# Patient Record
Sex: Female | Born: 1965 | Race: Black or African American | Hispanic: No | Marital: Married | Smoking: Current some day smoker
Health system: Southern US, Community
[De-identification: ages and names within clinical notes are randomized; demographics above are authoritative.]

## PROBLEM LIST (undated history)

## (undated) DIAGNOSIS — G2581 Restless legs syndrome: Secondary | ICD-10-CM

## (undated) DIAGNOSIS — M1990 Unspecified osteoarthritis, unspecified site: Secondary | ICD-10-CM

## (undated) DIAGNOSIS — I3139 Other pericardial effusion (noninflammatory): Secondary | ICD-10-CM

## (undated) DIAGNOSIS — I712 Thoracic aortic aneurysm, without rupture: Secondary | ICD-10-CM

## (undated) DIAGNOSIS — R0600 Dyspnea, unspecified: Secondary | ICD-10-CM

## (undated) DIAGNOSIS — I313 Pericardial effusion (noninflammatory): Secondary | ICD-10-CM

## (undated) DIAGNOSIS — G4730 Sleep apnea, unspecified: Secondary | ICD-10-CM

## (undated) DIAGNOSIS — I10 Essential (primary) hypertension: Secondary | ICD-10-CM

## (undated) DIAGNOSIS — M069 Rheumatoid arthritis, unspecified: Secondary | ICD-10-CM

## (undated) DIAGNOSIS — I7121 Aneurysm of the ascending aorta, without rupture: Secondary | ICD-10-CM

## (undated) DIAGNOSIS — R011 Cardiac murmur, unspecified: Secondary | ICD-10-CM

## (undated) DIAGNOSIS — I340 Nonrheumatic mitral (valve) insufficiency: Secondary | ICD-10-CM

## (undated) DIAGNOSIS — R51 Headache: Secondary | ICD-10-CM

## (undated) DIAGNOSIS — E119 Type 2 diabetes mellitus without complications: Secondary | ICD-10-CM

## (undated) DIAGNOSIS — I739 Peripheral vascular disease, unspecified: Secondary | ICD-10-CM

## (undated) DIAGNOSIS — D649 Anemia, unspecified: Secondary | ICD-10-CM

## (undated) DIAGNOSIS — N186 End stage renal disease: Secondary | ICD-10-CM

## (undated) DIAGNOSIS — I2510 Atherosclerotic heart disease of native coronary artery without angina pectoris: Secondary | ICD-10-CM

## (undated) DIAGNOSIS — E7800 Pure hypercholesterolemia, unspecified: Secondary | ICD-10-CM

## (undated) DIAGNOSIS — Z9289 Personal history of other medical treatment: Secondary | ICD-10-CM

## (undated) DIAGNOSIS — F419 Anxiety disorder, unspecified: Secondary | ICD-10-CM

## (undated) DIAGNOSIS — J189 Pneumonia, unspecified organism: Secondary | ICD-10-CM

## (undated) DIAGNOSIS — M792 Neuralgia and neuritis, unspecified: Secondary | ICD-10-CM

## (undated) DIAGNOSIS — I509 Heart failure, unspecified: Secondary | ICD-10-CM

## (undated) HISTORY — DX: Peripheral vascular disease, unspecified: I73.9

## (undated) HISTORY — DX: End stage renal disease: N18.6

## (undated) HISTORY — PX: AV FISTULA PLACEMENT: SHX1204

## (undated) HISTORY — PX: EYE SURGERY: SHX253

## (undated) HISTORY — DX: Atherosclerotic heart disease of native coronary artery without angina pectoris: I25.10

## (undated) HISTORY — PX: COLONOSCOPY: SHX174

---

## 1995-05-03 HISTORY — PX: TUBAL LIGATION: SHX77

## 2003-04-18 ENCOUNTER — Emergency Department (HOSPITAL_COMMUNITY): Admission: EM | Admit: 2003-04-18 | Discharge: 2003-04-18 | Payer: Self-pay | Admitting: Emergency Medicine

## 2003-04-21 ENCOUNTER — Inpatient Hospital Stay (HOSPITAL_COMMUNITY): Admission: EM | Admit: 2003-04-21 | Discharge: 2003-04-24 | Payer: Self-pay | Admitting: Emergency Medicine

## 2003-08-26 ENCOUNTER — Emergency Department (HOSPITAL_COMMUNITY): Admission: EM | Admit: 2003-08-26 | Discharge: 2003-08-26 | Payer: Self-pay | Admitting: Emergency Medicine

## 2004-01-01 ENCOUNTER — Emergency Department (HOSPITAL_COMMUNITY): Admission: EM | Admit: 2004-01-01 | Discharge: 2004-01-02 | Payer: Self-pay | Admitting: Emergency Medicine

## 2004-01-21 ENCOUNTER — Observation Stay (HOSPITAL_COMMUNITY): Admission: EM | Admit: 2004-01-21 | Discharge: 2004-01-23 | Payer: Self-pay | Admitting: Emergency Medicine

## 2004-02-02 HISTORY — PX: PERICARDIAL WINDOW: SHX2213

## 2004-02-07 ENCOUNTER — Ambulatory Visit: Payer: Self-pay | Admitting: Pulmonary Disease

## 2004-02-07 ENCOUNTER — Inpatient Hospital Stay (HOSPITAL_COMMUNITY): Admission: EM | Admit: 2004-02-07 | Discharge: 2004-02-12 | Payer: Self-pay | Admitting: *Deleted

## 2004-02-07 ENCOUNTER — Encounter: Payer: Self-pay | Admitting: Cardiology

## 2004-02-07 ENCOUNTER — Encounter (INDEPENDENT_AMBULATORY_CARE_PROVIDER_SITE_OTHER): Payer: Self-pay | Admitting: *Deleted

## 2004-02-07 ENCOUNTER — Ambulatory Visit: Payer: Self-pay | Admitting: Internal Medicine

## 2004-02-11 ENCOUNTER — Encounter: Payer: Self-pay | Admitting: Cardiology

## 2004-02-22 ENCOUNTER — Emergency Department (HOSPITAL_COMMUNITY): Admission: EM | Admit: 2004-02-22 | Discharge: 2004-02-22 | Payer: Self-pay | Admitting: Emergency Medicine

## 2004-02-26 ENCOUNTER — Ambulatory Visit: Payer: Self-pay | Admitting: Endocrinology

## 2004-03-02 ENCOUNTER — Encounter (HOSPITAL_COMMUNITY): Admission: RE | Admit: 2004-03-02 | Discharge: 2004-05-31 | Payer: Self-pay | Admitting: Cardiology

## 2004-03-09 ENCOUNTER — Ambulatory Visit: Payer: Self-pay | Admitting: Endocrinology

## 2004-03-13 ENCOUNTER — Ambulatory Visit: Payer: Self-pay | Admitting: Internal Medicine

## 2004-03-17 ENCOUNTER — Encounter: Admission: RE | Admit: 2004-03-17 | Discharge: 2004-06-15 | Payer: Self-pay | Admitting: Endocrinology

## 2004-03-25 ENCOUNTER — Ambulatory Visit (HOSPITAL_COMMUNITY): Admission: RE | Admit: 2004-03-25 | Discharge: 2004-03-25 | Payer: Self-pay | Admitting: Cardiology

## 2004-06-18 ENCOUNTER — Emergency Department (HOSPITAL_COMMUNITY): Admission: EM | Admit: 2004-06-18 | Discharge: 2004-06-18 | Payer: Self-pay | Admitting: Family Medicine

## 2005-09-03 ENCOUNTER — Emergency Department (HOSPITAL_COMMUNITY): Admission: EM | Admit: 2005-09-03 | Discharge: 2005-09-04 | Payer: Self-pay | Admitting: Emergency Medicine

## 2006-02-26 ENCOUNTER — Emergency Department (HOSPITAL_COMMUNITY): Admission: EM | Admit: 2006-02-26 | Discharge: 2006-02-26 | Payer: Self-pay | Admitting: Emergency Medicine

## 2007-01-10 ENCOUNTER — Emergency Department (HOSPITAL_COMMUNITY): Admission: EM | Admit: 2007-01-10 | Discharge: 2007-01-10 | Payer: Self-pay | Admitting: Emergency Medicine

## 2007-09-14 ENCOUNTER — Emergency Department (HOSPITAL_COMMUNITY): Admission: EM | Admit: 2007-09-14 | Discharge: 2007-09-14 | Payer: Self-pay | Admitting: Emergency Medicine

## 2008-02-13 ENCOUNTER — Emergency Department (HOSPITAL_COMMUNITY): Admission: EM | Admit: 2008-02-13 | Discharge: 2008-02-13 | Payer: Self-pay | Admitting: Emergency Medicine

## 2009-10-01 ENCOUNTER — Emergency Department (HOSPITAL_COMMUNITY): Admission: EM | Admit: 2009-10-01 | Discharge: 2009-10-01 | Payer: Self-pay | Admitting: Emergency Medicine

## 2010-04-16 LAB — GLUCOSE, CAPILLARY: Glucose-Capillary: 324 mg/dL — ABNORMAL HIGH (ref 70–99)

## 2010-05-18 LAB — URINALYSIS, ROUTINE W REFLEX MICROSCOPIC
Bilirubin Urine: NEGATIVE
Glucose, UA: NEGATIVE mg/dL
Ketones, ur: NEGATIVE mg/dL
Leukocytes, UA: NEGATIVE
Nitrite: NEGATIVE
Protein, ur: 100 mg/dL — AB
Specific Gravity, Urine: 1.019 (ref 1.005–1.030)
Urobilinogen, UA: 1 mg/dL (ref 0.0–1.0)
pH: 7 (ref 5.0–8.0)

## 2010-05-18 LAB — URINE MICROSCOPIC-ADD ON

## 2010-06-19 NOTE — H&P (Signed)
NAMEMarland Kitchen  ILKA, BRUCKS NO.:  [id number]   MEDICAL RECORD NO.:  [id number]          PATIENT TYPE:  INP   LOCATION:  1826                         FACILITY:  [facility]   PHYSICIAN:  Broadus John, MD DATE OF BIRTH:  1965/03/02   DATE OF ADMISSION:  01/21/2004  DATE OF DISCHARGE:                                HISTORY & PHYSICAL   IDENTIFYING DATA AND CHIEF COMPLAINT:  [name] is a 45 year old  black woman who is admitted to [facility] for further evaluation of  chest pain.   HISTORY OF THE PRESENT ILLNESS:  The patient has a several-month history of  chest pain.  She has been seen in the emergency department on multiple  occasions.  She has been previously diagnosed with musculoskeletal chest  wall pain.  Episodes have occurred at random over the last few months, lasts  several minutes to several hours at a time and are unrelated to position,  activity, meals or respirations.  They resolve spontaneously.  There is  usually no associated dyspnea, diaphoresis or nausea.   The patient experienced a severe episode of chest pain this evening.  She  was at rest at the time.  The chest pain is described as a sharp discomfort  in the substernal region.  It does not radiate.  It is associated with  dyspnea, diaphoresis and nausea at this time.  It has continued unabated  since its onset and since her visit to the emergency department.  There are  no exacerbating or ameliorating factors.  It appears to be unrelated to  position, activity, meals or respirations.   In the emergency department a second electrocardiogram demonstrated inferior  ST elevation and hence, cardiology was asked to intervene in her care.   The patient has no past history of cardiac disease including no history of  chest pain, myocardial infarction, coronary artery disease, congestive heart  failure, or arrhythmia.  She has a number of risk factors for coronary  artery disease  including smoking, diabetes mellitus, hypertension and a  family history of coronary artery disease (father).  Her lipid status is  unknown.   MEDICATIONS:  The patient takes Metformin for her diabetes and Glucophage.   ALLERGIES:  The patient is not allergic to any medications.   PAST SURGICAL HISTORY:  The patient has had previous operations of two  cesarean sections.   ALLERGIES:  None.   SOCIAL HISTORY:  The patient lives with her husband.  She is a Garment/textile technologist.   REVIEW OF SYSTEMS:  Review of systems reveals no new problems related to her  head, eyes, ears, nose, mouth, throat, lungs, gastrointestinal system,  genitourinary system, or extremities.  There is no history of neurologic or  psychiatric disorder.  There is no history of fever, chills or weight loss.   PHYSICAL EXAMINATION:  VITAL SIGNS:  Blood pressure 102/60, pulse 98 and  regular, respirations 30, and temperature 98.2.  GENERAL APPEARANCE:  The patient is an obese middle-aged black woman in  severe discomfort.  She is alert, oriented and responsive.  HEENT:  Head, eyes, nose and mouth  are normal.  NECK:  The neck is without thyromegaly or adenopathy.  Carotid pulses are  palpable bilaterally and without bruits.  HEART:  Cardiac examination reveals a normal S1 and S2.  There is no S3, S4,  murmur, rub, or click.  Cardiac rhythm is regular.  CHEST:  No chest wall tenderness is present.  LUNGS:  The lungs are clear.  ABDOMEN:  The abdomen is soft and nontender.  There is no mass,  hepatosplenomegaly, bruit, distention, rebound, guarding, or rigidity.  Bowel sounds are normal.  BREASTS, PELVIC AND RECTAL:  The breast, pelvic and rectal examinations are  not performed as they are not pertinent to the reason for acute care  hospitalization.  EXTREMITIES:  The extremities are without edema, deviation or deformity.  Radial and dorsalis pedal pulses are palpable bilaterally.  NEUROLOGIC EXAMINATION:  Brief screening  neurologic survey is unremarkable.   LABORATORY DATA:  The initial electrocardiogram performed at 19:54 revealed  mild (less than or equal to 1 mm) inferior ST segment elevation.  A second  electrocardiogram performed at 02:22 revealed 1-1.5 mm of inferior ST  segment elevation.  The inferior ST elevation on these tracings was more  pronounced than on the tracing dated August 26, 2003 in her medical records.  White count was 10.6 with a hemoglobin of 10.4 and hematocrit of 31.8.  The  initial set of cardiac markers revealed a myoglobin of 80.7, CK/MB _____ and  troponin less than 0.05.  The third set revealed a myoglobin of 66.1, CK/MB  1.8 and troponin less than 0.05.  Fibrin derivatives were 1.11.  Potassium  was 3.6, BUN 11 and creatinine 0.5.  The remaining studies were pending at  the time of this dictation.   A chest CT according to the radiologist revealed no evidence of pulmonary  embolus or aortic dissection.   IMPRESSION:  Chest pain, rule out myocardial infarction.  Pulmonary embolus and aortic dissection are excluded by chest computerized  tomography.  Pericarditis is a possibility, but the ST segment elevation is  limited to the inferior lead and is not global.  The pain is somewhat  atypical for a myocardial infarction as it is sharp and pleuritic.  The  inferior ST elevation is mild, but more prominent than on the tracing  earlier this evening and essentially new since the August 26, 2003 tracing.   The patient has multiple risk factors for coronary artery disease including  smoking, diabetes mellitus, hypertension and a family history of coronary  artery disease.   Due to these circumstances emergent cardiac catheterization is warranted to  exclude an acute myocardial infarction.   PLAN:  1.  Emergent cardiac catheterization.  2.  Intravenous nitroglycerin and intravenous heparin until then.  3.  Aspirin.  Cardiac catheterization, PTCA and stenting have been discussed  with the  patient including the benefits, risks and alternatives. She wishes to  proceed.   This patient encounter was chaperoned by nurse J. Chrissie Noa.      Mitc   MSC/MEDQ  D:  01/22/2004  T:  01/22/2004  Job:  [id number]   cc:   Peter M. Martinique, M.D.  [address] [place], [place] [address]  Fax: [phone]

## 2010-06-19 NOTE — H&P (Signed)
[name], [name]                        ACCOUNT NO.:  [id number]   MEDICAL RECORD NO.:  [id number]                   PATIENT TYPE:  INP   LOCATION:  0354                                 FACILITY:  [facility]   PHYSICIAN:  Wenda Low, M.D.                 DATE OF BIRTH:  [birth year]   DATE OF ADMISSION:  04/20/2003  DATE OF DISCHARGE:                                HISTORY & PHYSICAL   CHIEF COMPLAINT:  Fevers and some shortness of breath.   PRIMARY CARE PHYSICIAN:  Dr. Valere Dross at [facility].   HISTORY OF PRESENT ILLNESS:  [age]-year-old African-American female  with history of obesity and diabetes seen in the emergency room on April 18, 2003 with 2- to 3-day history of respiratory symptoms and a little bit of  head congestion and fever of 103, had influenza titers done 2 hours which  was negative for A and B, she was given some fluids and her temperature came  down, she was sent home.  Patient stated that she had low-grade temperature  at home and today fever went up to 103 and started having a little bit of a  cough today and some yellow sputum, some shortness of breath especially when  she breathes her right side of the chest hurts and had little bit of  headaches, sore throat and sinus congestion, no nausea, vomiting, or  diarrhea.  Her blood sugar was elevated today to 200.  In the emergency room  she was found to be febrile at 104 with a chest x-ray showing an infiltrate  which was new from April 18, 2003.   PAST MEDICAL HISTORY:  1. Diabetes since 1997.  2. Hypertension.   MEDICATIONS:  1. Glucophage 500 b.i.d.  2. Glyburide 2.5 mg once daily.  3. Actos 45 mg once daily.  4. Lotensin 20 mg once daily.   ALLERGIES:  None.   PAST SURGICAL HISTORY:  Cesarean sections x2.   FAMILY HISTORY:  Father deceased with diabetic complications.  Mother 68  with diabetes.  One brother diabetic.  One sister has __________tension.   TOBACCO:  One pack a  day.   ALCOHOL:  None.   DRUGS:  No recreational drugs.   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  Married with two children.   REVIEW OF SYSTEMS:  As above.   PHYSICAL EXAMINATION:  VITAL SIGNS:  Initially temperature was 104.1, blood  pressure 144/69, heart rate 120-130 sinus tachycardia, respirations 22, 99%  saturations on room air.  GENERAL:  No acute distress lying in bed comfortable, denied any shortness  of breath, complained of some right-sided chest soreness with breathing.  Mucous membranes mildly dry.  LUNGS:  Clear, occasional rhonchi on the right side.  CARDIAC:  S1, S2, tachycardic without murmurs.  NECK:  Supple.  ABDOMEN:  Soft, obese, positive bowel sounds, nontender.  EXTREMITIES:  No edema.  LABORATORY:  White count of 27.8, hemoglobin 9.9.  Chemistry:  Sodium 135,  potassium 3.1, BUN of 11, creatinine 0.8, glucose 192.  LFTs were normal.  UA was negative.  Pregnancy test negative.  Blood cultures were sent.  Sputum culture sent.  Chest x-ray:  Right lung infiltrate most likely at the  right middle lobe versus right upper lobe lower segment.   IMPRESSION:  [age]-year-old female with diabetes presented with  fevers, elevated white count, influenza titers negative 2 days ago with  right lung infiltrate.   Problem 1. RIGHT LUNG PNEUMONIA.  Admit for intravenous antibiotics  (Rocephin and gentamicin), oxygen, intravenous fluids.   Problem 2. HYPERTENSION.  Continue on Lotensin.   Problem 3. DIABETES.  Continue on Glucophage and Actos as well as glyburide,  sliding scale insulin.   Problem 4. HYPOKALEMIA.  Mild.   Problem 5. TOBACCO ABUSE.  Continue with nicotine patch.  She smokes one  pack a day.   Problem 6. ANEMIA.  We will check some iron studies.                                               Wenda Low, M.D.    Veatrice Bourbon  D:  04/21/2003  T:  04/22/2003  Job:  [id number]   cc:   Biagio Borg, M.D.  [address], [place]  [place]  [place] [address]  Fax: [phone]

## 2010-06-19 NOTE — Discharge Summary (Signed)
[name], [name]                        ACCOUNT NO.:  [id number]   MEDICAL RECORD NO.:  [id number]                   PATIENT TYPE:  INP   LOCATION:  0354                                 FACILITY:  [facility]   PHYSICIAN:  Melissa L. Lovena Le, MD               DATE OF BIRTH:  [birth year]   DATE OF ADMISSION:  04/20/2003  DATE OF DISCHARGE:  04/24/2003                                 DISCHARGE SUMMARY   ADMISSION DIAGNOSES:  1. Right lower lobe pneumonia.  2. Hypertension.  3. Diabetes.  4. Hypokalemia.  5. Tobacco abuse.  6. Anemia.   DISCHARGE DIAGNOSES:  1. Resolving right lower lobe lung pneumonia, status post Rocephin and     azithromycin x3 days intravenously and changed to p.o. moxifloxacin.  2. Hypertension, stable.  3. Diabetes, mildly uncontrolled on Glucophage, Actos, Glyburide, and     sliding scale insulin.  4. Hypokalemia, repleted.  5. Tobacco abuse remains an issue at discharge.  6. Anemia, stable.  Patient receiving iron therapy.  7. Herpes labialis, status post treatment with Valtrex.   DISCHARGE MEDICATIONS:  1. Metformin 1000 mg in the a.m. and 500 mg in the p.m.  This has been     changed from 500 mg b.i.d. at admission.  2. Actos 45 mg daily.  3. Lotensin 20 mg once daily.  4. Humibid LA 600 mg b.i.d.  5. Glyburide 2.5 mg once daily.  6. Ferrous sulfate 325 mg t.i.d.  7. Moxifloxacin 400 mg once daily x7 days.   ACTIVITY:  Return to work Monday one week following this discharge.   DIET:  Carbohydrate modified.   FOLLOWUP:  She is to see her primary care physician, Dr. Valere Dross, in one  week, and make an appointment to see her OB/GYN doctor regarding her  increased menstrual bleeding.   HISTORY OF PRESENT ILLNESS:  The patient is a 45 year old African-American  female with a history of obesity and diabetes who was evaluated on April 18, 2003, with two to three day's history of respiratory symptoms.  She had a  fever at the time.  Influenza titers  were drawn which were negative.  She  was given fluids and was discharged to home for symptomatic care.  The  patient then experienced further fever activity up to 103, with a cough  productive of yellow sputum, as well as increasing shortness of breath and  right-sided chest discomfort.  The chest x-ray revealed a right-sided  infiltrate which was new since the emergency room visit on April 18, 2003.   HOSPITAL COURSE:  The patient was admitted to the telemetry floor.  She was  started on Rocephin and azithromycin, and aggressive pulmonary toilet, was  rehydrated.  White count was found to be 26.4 on admission with a potassium  of 3.1.  The patient remained with a cough on day #2, but appeared improved  status post IV rehydration.  She offered no  significant complaint of chest  pain and her overall clinical picture appeared improved after starting on  antibiotics.  By April 24, 2003, the patient states that her sputum is  clear, she has less cough, she is not short of breath with ambulation, her  white count was found to be 8.8, which was down significantly, and her  hemoglobin and hematocrit were stable at 9.7 and 29.5.  Her potassium was 4.  She was converted to oral moxifloxacin for discharge, and an order was  placed for her diabetes monitoring equipment, as well as other needed strips  and Lantus.  Her anemia was determined to be stable, and she was instructed  to see her OB/GYN to evaluate her regularly heavy periods.  She was  instructed to continue her oral iron.  She had been treated with a course of  Valtrex for herpes labialis during the course of the hospitalization.   PHYSICAL EXAMINATION:  VITAL SIGNS:  On the day of discharge, her vital  signs were 98.7 temperature with a blood pressure of 129/76, pulse of 77,  respiratory rate of 18.  Her blood glucoses ranged from 139 to 217.  GENERAL:  She was in no acute distress.  HEENT:  Pupils equal, round, reactive to light.   Extraocular movements were  intact.  Mucous membranes are moist.  CHEST:  Clear to auscultation.  CARDIOVASCULAR:  Regular rate and rhythm, positive S1 and S2.  No S3 or S4.  ABDOMEN:  Soft, nontender, nondistended, positive bowel sounds.  EXTREMITIES:  There was no edema.  NEUROLOGIC:  She was nonfocal and appeared stable for discharge.   CONDITION ON DISCHARGE:  Stable with followup to her primary care physician,  Dr. Jacklynn Ganong.                                               Melissa L. Lovena Le, MD    MLT/MEDQ  D:  05/23/2003  T:  05/24/2003  Job:  [id number]   cc:   Biagio Borg, M.D.  [address], [place]  [place]  [place] [address]  Fax: [phone]

## 2010-06-19 NOTE — H&P (Signed)
[name], [name] NO.:  [id number]   MEDICAL RECORD NO.:  [id number]          PATIENT TYPE:  INP   LOCATION:  [id number]                         FACILITY:  [facility]   PHYSICIAN:  Broadus John, MD DATE OF BIRTH:  1965/07/07   DATE OF ADMISSION:  02/06/2004  DATE OF DISCHARGE:                                HISTORY & PHYSICAL   HISTORY OF PRESENT ILLNESS:  [name] is a 45 year old black woman  who is admitted to [facility] because of a large pericardial  effusion.   The patient was first seen on January 22, 2004.  At that time, she  presented with chest pain.  Based on her electrocardiogram, then it was  difficult to determine if her chest pain was due to acute myocardial  infarction or a pericarditis.  In order to exclude an acute myocardial  infarction, she was taken emergently to the cardiac catheterization  laboratory where no coronary artery disease was found.  It was concluded  that her chest pain was due to pericarditis.  She was discharged on Motrin.  An echocardiogram was performed during that hospitalization, but the results  are not yet on the chart.   The patient returned to the emergency department tonight because of chest  pain and dyspnea.  Both of these symptoms have accelerated in the last day.  The chest pain is an ache in her left anterior chest.  She has not  experienced any dizziness, lightheadedness, syncope, or near syncope.  Her  chest pain appeared to improve in the sitting position.  A chest x-ray this  evening demonstrated new cardiomegaly.  Fibrin derivatives were elevated.  A  chest CT has demonstrated a large pericardial effusion, bilateral pleural  effusions, and a single tiny pulmonary embolus in the central right  pulmonary artery.  She is admitted for further evaluation and management.   MEDICATIONS:  Glucophage.   ALLERGIES:  The patient is not allergic to any medications.   PAST SURGICAL HISTORY:  The  patient has had 2 prior cesarean sections.   SOCIAL HISTORY:  The patient lives with her husband.  She is a Psychologist, clinical.   REVIEW OF SYSTEMS:  Reveals no new problems related to her head, ears, nose,  mouth, throat, lungs, gastrointestinal system, genitourinary system, or  extremities.  There is no history of neurologic or psychiatric reported.  There is no history of fever, chills, or weight loss.   PHYSICAL EXAMINATION:  VITAL SIGNS:  Blood pressure 118/62, pulse 124 and  regular, respirations 24, temperature 99.1.  Pulse oximetry 95% on room air.  GENERAL:  The patient was an obese, middle-aged blood woman in moderate  discomfort.  She was alert, oriented, and responsive.  HEENT:  Head, eyes, nose, and mouth were normal.  NECK:  The neck was without thyromegaly or adenopathy.  There was no jugular  venous distention.  Carotid pulses were palpable bilaterally and without  bruits.  CARDIAC:  A normal S1 and S2.  There was no S3, S4, murmur, rub, or click.  Cardiac rhythm was regular.  CHEST WALL:  No  chest wall tenderness was noted.  LUNGS:  Clear.  ABDOMEN:  Soft and nontender.  There was no mass, hepatosplenomegaly, bruit,  distention, rebound, guarding, or rigidity.  Bowel sounds were normal.  BREASTS/PELVIC/RECTAL:  Not performed, as they were not pertinent to the  reason for acute care hospitalization.  EXTREMITIES:  Without edema, deviation, or deformity.  There was no cord,  tenderness, or swelling.  Radial and dorsalis pedis pulses were palpable  bilaterally.  Brief screening neurologic survey was unremarkable.   The chest radiograph, as reported above, demonstrated cardiomegaly.  A chest  CT revealed a large pericardial effusion, bilateral pleural effusions, and a  single tiny pulmonary embolus in the central pulmonary artery.  The  electrocardiogram reveals sinus tachycardia and nonspecific T wave  abnormality.  Voltages were diminished from her studies in December.   Arterial blood gas on 2 liters of oxygen via nasal cannula revealed a pH of  7.47, PO2 of 69, PCO2 of 32, and oxygen saturation 95.  White count was 14.6  with a hemoglobin of 8.4 and hematocrit of 25.6.  Fibrin derivatives were  5.60.  Potassium was 3.8, BUN 13, and creatinine 0.7.  The remaining studies  were pending at the time of this dictation.   IMPRESSION:  1.  Large pericardial effusion; rule out tamponade.  2.  Bilateral pleural effusions.  3.  Single tiny pulmonary embolus in central right pulmonary artery.  4.  Anemia; worse in last 2 weeks.  5.  Noninsulin-dependent diabetes mellitus.   PLAN:  1.  Coronary care unit.  2.  Emergency echocardiogram.  3.  No anticoagulation until it can be confirmed that the effusion is not      hemorrhagic.  4.  Evaluate for etiology of the pericardial and pleural effusions.  5.  Work up anemia.  6.  Further measures per Dr. Martinique.      Mitc   MSC/MEDQ  D:  02/07/2004  T:  02/07/2004  Job:  [id number]   cc:   Peter M. Martinique, M.D.  [id number] [place], [place] [address]  Fax: [phone]

## 2010-06-19 NOTE — Cardiovascular Report (Signed)
[name], [name] NO.:  [id number]   MEDICAL RECORD NO.:  [id number]          PATIENT TYPE:  INP   LOCATION:  2929                         FACILITY:  [facility]   PHYSICIAN:  Peter M. Martinique, M.D.  DATE OF BIRTH:  10/28/1965   DATE OF PROCEDURE:  01/22/2004  DATE OF DISCHARGE:                              CARDIAC CATHETERIZATION   INDICATION FOR PROCEDURE:  This 45 year old black female with multiple  cardiac risk factors including of tobacco abuse, diabetes mellitus,  hyperlipidemia, and hypertension presents with refractory chest pain.  She  does have mild ST elevation in the inferior leads.  Emergent chest CT was  negative for pulmonary embolus or aortic dissection.  There was no obvious  pericardial effusion.  Patient having ongoing severe chest pain.   PROCEDURES:  1.  Left heart catheterization.  2.  Coronary and left ventricular angiography.   ACCESS:  Via the right femoral artery using standard Seldinger technique.   EQUIPMENT:  6-French 4 cm right and left Judkins catheter, 6-French pigtail  catheter, 6-French arterial sheath.   CONTRAST:  90 mL of Omnipaque.   MEDICATIONS:  Local anesthesia 1% Xylocaine, fentanyl 25 mcg IV.   HEMODYNAMIC DATA:  1.  Aortic pressure was 120/79 with a mean of 98 mmHg.  2.  Left ventricular pressure was 120 with EDP of 25 mmHg.   ANGIOGRAPHIC DATA:  1.  The left coronary artery arises and distributes normally.  It is a very      large vessel.  The left main coronary artery is normal.  2.  Left anterior descending artery is a large vessel and is normal.  Gives      rise to a large diagonal branch which is also normal.  3.  The left circumflex coronary artery gives rise to two large marginal      vessels and this entire vessel is normal.  4.  The right coronary artery arises and distributes normally.  It is a      dominant vessel.  It is normal throughout its course.   LEFT VENTRICULOGRAPHY:  Left ventricular  angiography was performed in the  RAO view.  This demonstrates normal left ventricular size with hyperdynamic  contractility.  Ejection fraction estimated at 75%.  There are no segmental  wall motion abnormalities.  There is no mitral insufficiency.  The aortic  valve appears normal.   FINAL INTERPRETATION:  1.  Normal coronary anatomy.  2.  Normal left ventricular function.  Based on these findings we have      suggested the patient's pain syndrome is secondary to acute      pericarditis.       PMJ/MEDQ  D:  01/22/2004  T:  01/22/2004  Job:  [id number]   cc:   Biagio Borg, M.D.  [address] [place]  [place]  [place] [address]  Fax: [phone]

## 2010-06-19 NOTE — Op Note (Signed)
NAMEJAHNEA, [name] NO.:  [id number]   MEDICAL RECORD NO.:  [id number]          PATIENT TYPE:  INP   LOCATION:  2931                         FACILITY:  [facility]   PHYSICIAN:  Lanelle Bal, MD    DATE OF BIRTH:  1965-12-18   DATE OF PROCEDURE:  02/07/2004  DATE OF DISCHARGE:                                 OPERATIVE REPORT   PREOPERATIVE DIAGNOSIS:  Large pericardial effusion with tamponade  physiology.   POSTOPERATIVE DIAGNOSIS:  Large pericardial effusion with tamponade  physiology.   PROCEDURE:  Subxiphoid pericardial window and drainage of pericardial  effusion.   SURGEON:  Lanelle Bal, M.D.   BRIEF HISTORY:  The patient is a 45 year old female who had been  hospitalized several weeks prior with nonspecific chest pain.  At that time,  she had undergone cardiac catheterization which showed no evidence of  coronary disease.  She was treated with anti-inflammatories with presumed  pericarditis.  She now returns with several-day history of increasing  shortness of breath to the point where she could not lie down flat without  becoming severely dyspneic.  Echocardiogram and CT scans were done which  demonstrated large pericardial effusion, nonspecific mediastinal lymph node  enlargement, question of a small pulmonary embolus in the right pulmonary  artery but this was questionable call and a large pericardial effusion.  Because of the patient's symptoms, urgent pericardial drainage was  recommended to the patient who agreed and signed informed consent.   DESCRIPTION OF PROCEDURE:  The patient underwent general endotracheal  anesthesia.  She remained hemodynamically stable in spite of the tamponade  physiology.  A transesophageal echocardiogram probe was placed confirming a  large pericardial circumferential pericardial effusion.  The skin of the  chest was prepped with Betadine and draped in the usual sterile manner.  An  incision was made over this  xiphoid process and carried down to the xiphoid  process, which was partially removed with the bone rongeur.  The ribs were  then elevated with the Rultract and the mediastinal fat was dissected off  the pericardial sac and the pericardial sac was identified and opened.  Approximately 560-600 mL of bloody pericardial effusion was removed.  A  large quantity of this fluid was sent for cytology.  Additional fluid was  sent for chemistries and culture, including viral cultures.  The pericardium  itself was just only slightly thickened.  There was some fibrinous debris on  the epicardium. The angle 28-chest tube was left in the pericardial space  and brought out through a separate stab wound on the chest.  Fascia was then  closed with interrupted 0 Vicryl, running 3-0 Vicryl on subcutaneous tissue  and 4-0 subcuticular stitch on the skin edges. The patient tolerated the  procedure.   The patient had the procedure, transesophageal echocardiogram, which showed  near-complete resolution of the pericardial effusion and with return to  normal dynamic movements, especially of the right ventricle and atrium.  The  patient's attempts at weaning and extubation of the patient succeeded in an  extubation.  However, after a period of observation, the patient became  tired  and required re-intubation.  She was then transferred to the coronary  care unit on the ventricular.   Estimated blood loss was minimal.  Because of low hematocrit at the  beginning of the procedure, the patient was transfused a unit of packed red  blood cells.      Edwa   EG/MEDQ  D:  02/07/2004  T:  02/07/2004  Job:  [id number]   cc:   Glory Buff, M.D. [facility]   Peter M. Martinique, M.D.  [address] [place], [place] [address]  Fax: [phone]

## 2010-06-19 NOTE — Consult Note (Signed)
[name], [name]            ACCOUNT NO.:  [id number]   MEDICAL RECORD NO.:  [id number]          PATIENT TYPE:  INP   LOCATION:  2931                         FACILITY:  [facility]   PHYSICIAN:  Revonda Standard. Roxan Hockey, M.D.DATE OF BIRTH:  Aug 09, 1965   DATE OF CONSULTATION:  02/07/2004  DATE OF DISCHARGE:                                   CONSULTATION   REASON FOR CONSULTATION:  Pericardial effusion.   HISTORY OF PRESENT ILLNESS:  [name] is a 45 year old African American  female who presents with a chief complaint of shortness of breath, cough and  low grade fevers.  She also has chest tightness with deep breaths.  She had  been diagnosed with acute pericarditis on January 22, 2004.  At that time  she had a negative CT of the chest and a normal echocardiogram.  She had an  elevated sed rate and elevated RA factors.  She was treated with anti-  inflammatories and had initial response, but over the past week, has  developed progressive dyspnea, also chest tightness with deep breathing.  She has also noted cough, low grade fever and increase in abdominal girth.  She was admitted and had a CT of the chest which showed a large pericardial  effusion, small bilateral pleural effusions.  There also was small embolus  in the right pulmonary artery.  Echocardiogram  was performed which showed  hyperdynamic left ventricle.  There was right atrial collapse as well as  right ventricular compression.  There also was evidence for tamponade with  pulsus paradoxus.   Currently, the patient is short of breath but hemodynamically stable with  blood pressure of 138/70.  She is tachycardic at 129.   PAST MEDICAL HISTORY:  1.  Acute pericarditis.  2.  Anemia.  3.  Diabetes.  4.  Small pulmonary nodule.  5.  Thrombocytosis  6.  Hyponatremia.   CURRENT MEDICATIONS:  1.  Insulin drip.  2.  Started on Solu-Medrol.  3.  She was on Glucophage prior to admission.   ALLERGIES:  No known drug  allergies.   SOCIAL HISTORY:  Occasional ethanol.  She smokes one half pack a day.  She  does not use drugs.   FAMILY HISTORY:  Noncontributory.   REVIEW OF SYMPTOMS:  Negative other than that as mentioned in the HPI.   PHYSICAL EXAMINATION:  GENERAL APPEARANCE:  [name] is a 45 year old  African American female.  She is obese.  She is in mild respiratory  distress.  She does have a pulsus paradoxus at baseline.  VITAL SIGNS:  Blood pressure is 130/70.  NECK:  JVD is hard to establish.  She is obese.  LUNGS:  Her lungs have diminished breath sounds at the bases.  CARDIOVASCULAR:  Her heart sounds are muffled.  There is no murmur.  EXTREMITIES:  She does have 1+ pitting edema bilaterally.   LABORATORY DATA:  Her white count is 14.6, hematocrit 25.6, platelets 592.  Sed rate is 83.  PT 13.7, PTT 38.  BUN and creatinine 13 and 0.7, sodium  133, potassium 8.8.  CPK was 134, MB 1.2, troponin  0.01.   IMPRESSION:  [name] is a 45 year old African American female with  pericarditis.  She now has a large pericardial effusion.  This has been  rapidly progressive and is symptomatic.  She does have echocardiographic  signs of tamponade, although clinically she is not in tamponade at this  point at time.  She does need urgent subxiphoid pericardial window for  drainage of fluid.  The fluid will be sent for appropriate studies as well.  The issue was complicated somewhat by the pulmonary embolus.  I think this  would push for surgical definitive drainage rather than pericardial centesis  so that she could be started on full dose anticoagulate as soon as possible.  This patient understands that there will be some delay in initiation of  anticoagulation due to the issues surrounding surgery.  She understands the  nature and extent of the procedure and she understands the risks including,  but are not limited to, death, bleeding, infection, pulmonary embolus, and  cardiac injury.  She  understands and accepts these risks and agrees to  proceed with surgery.  Will plan to proceed with surgery as soon as  possible.  Dr. Servando Snare is likely to be the first surgeon available.  Once  his room is open, he would be the one to do the procedure.  She understands  this as well.       SCH/MEDQ  D:  02/07/2004  T:  02/07/2004  Job:  [id number]

## 2010-07-15 ENCOUNTER — Inpatient Hospital Stay (INDEPENDENT_AMBULATORY_CARE_PROVIDER_SITE_OTHER)
Admission: RE | Admit: 2010-07-15 | Discharge: 2010-07-15 | Disposition: A | Discharge status: Home / Self Care | Payer: Self-pay | Source: Ambulatory Visit | Attending: Family Medicine | Admitting: Family Medicine

## 2010-07-15 DIAGNOSIS — I10 Essential (primary) hypertension: Secondary | ICD-10-CM

## 2010-07-15 LAB — POCT I-STAT, CHEM 8
BUN: 9 mg/dL (ref 6–23)
Chloride: 101 mEq/L (ref 96–112)
Creatinine, Ser: 0.9 mg/dL (ref 0.4–1.2)
Sodium: 133 mEq/L — ABNORMAL LOW (ref 135–145)
TCO2: 26 mmol/L (ref 0–100)

## 2010-08-02 ENCOUNTER — Emergency Department (HOSPITAL_COMMUNITY): Payer: Self-pay

## 2010-08-02 ENCOUNTER — Emergency Department (HOSPITAL_COMMUNITY)
Admission: EM | Admit: 2010-08-02 | Discharge: 2010-08-02 | Disposition: A | Discharge status: Home / Self Care | Payer: Self-pay | Attending: Emergency Medicine | Admitting: Emergency Medicine

## 2010-08-02 DIAGNOSIS — E119 Type 2 diabetes mellitus without complications: Secondary | ICD-10-CM | POA: Insufficient documentation

## 2010-08-02 DIAGNOSIS — I10 Essential (primary) hypertension: Secondary | ICD-10-CM | POA: Insufficient documentation

## 2010-08-02 DIAGNOSIS — R1011 Right upper quadrant pain: Secondary | ICD-10-CM | POA: Insufficient documentation

## 2010-08-02 DIAGNOSIS — Q619 Cystic kidney disease, unspecified: Secondary | ICD-10-CM | POA: Insufficient documentation

## 2010-08-02 DIAGNOSIS — Z79899 Other long term (current) drug therapy: Secondary | ICD-10-CM | POA: Insufficient documentation

## 2010-08-02 DIAGNOSIS — L2989 Other pruritus: Secondary | ICD-10-CM | POA: Insufficient documentation

## 2010-08-02 DIAGNOSIS — L298 Other pruritus: Secondary | ICD-10-CM | POA: Insufficient documentation

## 2010-08-02 LAB — URINALYSIS, ROUTINE W REFLEX MICROSCOPIC
Bilirubin Urine: NEGATIVE
Glucose, UA: 500 mg/dL — AB
Protein, ur: >300 mg/dL — AB

## 2010-08-02 LAB — CBC
HCT: 32.7 % — ABNORMAL LOW (ref 36.0–46.0)
Hemoglobin: 11.2 g/dL — ABNORMAL LOW (ref 12.0–15.0)
MCH: 28.9 pg (ref 26.0–34.0)
MCHC: 34.3 g/dL (ref 30.0–36.0)
MCV: 84.5 fL (ref 78.0–100.0)

## 2010-08-02 LAB — COMPREHENSIVE METABOLIC PANEL
ALT: 20 U/L (ref 0–35)
AST: 21 U/L (ref 0–37)
Alkaline Phosphatase: 58 U/L (ref 39–117)
CO2: 26 mEq/L (ref 19–32)
Calcium: 9.3 mg/dL (ref 8.4–10.5)
Chloride: 100 mEq/L (ref 96–112)
GFR calc Af Amer: >60 mL/min (ref >60)
GFR calc non Af Amer: >60 mL/min (ref >60)
Glucose, Bld: 246 mg/dL — ABNORMAL HIGH (ref 70–99)
Potassium: 3.3 mEq/L — ABNORMAL LOW (ref 3.5–5.1)
Sodium: 137 mEq/L (ref 135–145)

## 2010-08-02 LAB — DIFFERENTIAL
Basophils Relative: 1 % (ref 0–1)
Lymphs Abs: 2.6 10*3/uL (ref 0.7–4.0)
Monocytes Absolute: 0.6 10*3/uL (ref 0.1–1.0)
Monocytes Relative: 7 % (ref 3–12)
Neutro Abs: 5.4 10*3/uL (ref 1.7–7.7)

## 2010-08-02 LAB — URINE MICROSCOPIC-ADD ON

## 2010-08-18 ENCOUNTER — Emergency Department (HOSPITAL_COMMUNITY)
Admission: EM | Admit: 2010-08-18 | Discharge: 2010-08-18 | Disposition: A | Discharge status: Home / Self Care | Payer: Self-pay | Attending: Emergency Medicine | Admitting: Emergency Medicine

## 2010-08-18 ENCOUNTER — Emergency Department (HOSPITAL_COMMUNITY): Payer: Self-pay

## 2010-08-18 DIAGNOSIS — Z79899 Other long term (current) drug therapy: Secondary | ICD-10-CM | POA: Insufficient documentation

## 2010-08-18 DIAGNOSIS — R109 Unspecified abdominal pain: Secondary | ICD-10-CM | POA: Insufficient documentation

## 2010-08-18 DIAGNOSIS — E119 Type 2 diabetes mellitus without complications: Secondary | ICD-10-CM | POA: Insufficient documentation

## 2010-08-18 DIAGNOSIS — R319 Hematuria, unspecified: Secondary | ICD-10-CM | POA: Insufficient documentation

## 2010-08-18 DIAGNOSIS — I10 Essential (primary) hypertension: Secondary | ICD-10-CM | POA: Insufficient documentation

## 2010-08-18 LAB — URINALYSIS, ROUTINE W REFLEX MICROSCOPIC
Leukocytes, UA: NEGATIVE
Nitrite: NEGATIVE
Protein, ur: >300 mg/dL — AB
Specific Gravity, Urine: 1.025 (ref 1.005–1.030)
Urobilinogen, UA: 0.2 mg/dL (ref 0.0–1.0)

## 2010-08-18 LAB — POCT I-STAT, CHEM 8
Hemoglobin: 11.6 g/dL — ABNORMAL LOW (ref 12.0–15.0)
Potassium: 3 mEq/L — ABNORMAL LOW (ref 3.5–5.1)
Sodium: 135 mEq/L (ref 135–145)
TCO2: 21 mmol/L (ref 0–100)

## 2010-08-18 LAB — URINE MICROSCOPIC-ADD ON

## 2010-08-18 MED ORDER — IOHEXOL 300 MG/ML  SOLN
80.0000 mL | Freq: Once | INTRAMUSCULAR | Status: AC | PRN
  Administered 2010-08-18: 80 mL via INTRAVENOUS

## 2010-10-30 LAB — CBC
HCT: 33.7 — ABNORMAL LOW
Hemoglobin: 11.2 — ABNORMAL LOW
MCHC: 33.3
MCV: 81
RBC: 4.16

## 2010-10-30 LAB — POCT I-STAT, CHEM 8
Calcium, Ion: 1.11 — ABNORMAL LOW
Chloride: 104
Glucose, Bld: 290 — ABNORMAL HIGH
HCT: 34 — ABNORMAL LOW
TCO2: 24

## 2010-10-30 LAB — DIFFERENTIAL
Basophils Relative: 1
Eosinophils Absolute: 0.3
Eosinophils Relative: 3
Monocytes Absolute: 0.4
Monocytes Relative: 5
Neutro Abs: 4.9

## 2010-10-30 LAB — B-NATRIURETIC PEPTIDE (CONVERTED LAB): Pro B Natriuretic peptide (BNP): 45

## 2010-11-09 LAB — DIFFERENTIAL
Basophils Absolute: 0.2 — ABNORMAL HIGH
Eosinophils Absolute: 0.3
Eosinophils Relative: 3

## 2010-11-09 LAB — CBC
MCV: 78.7
RBC: 4.01
WBC: 11.1 — ABNORMAL HIGH

## 2010-11-09 LAB — COMPREHENSIVE METABOLIC PANEL
ALT: 15
AST: 16
CO2: 28
Chloride: 100
Creatinine, Ser: 0.69
GFR calc Af Amer: >60
GFR calc non Af Amer: >60
Sodium: 135
Total Bilirubin: 0.5

## 2010-11-09 LAB — D-DIMER, QUANTITATIVE: D-Dimer, Quant: 0.25

## 2010-12-11 ENCOUNTER — Encounter: Payer: Self-pay | Admitting: *Deleted

## 2010-12-11 ENCOUNTER — Emergency Department (HOSPITAL_COMMUNITY)
Admission: EM | Admit: 2010-12-11 | Discharge: 2010-12-11 | Disposition: A | Discharge status: Home / Self Care | Payer: Self-pay | Attending: Emergency Medicine | Admitting: Emergency Medicine

## 2010-12-11 DIAGNOSIS — R631 Polydipsia: Secondary | ICD-10-CM | POA: Insufficient documentation

## 2010-12-11 DIAGNOSIS — E119 Type 2 diabetes mellitus without complications: Secondary | ICD-10-CM | POA: Insufficient documentation

## 2010-12-11 DIAGNOSIS — Z7982 Long term (current) use of aspirin: Secondary | ICD-10-CM | POA: Insufficient documentation

## 2010-12-11 DIAGNOSIS — F17200 Nicotine dependence, unspecified, uncomplicated: Secondary | ICD-10-CM | POA: Insufficient documentation

## 2010-12-11 DIAGNOSIS — R609 Edema, unspecified: Secondary | ICD-10-CM | POA: Insufficient documentation

## 2010-12-11 DIAGNOSIS — R209 Unspecified disturbances of skin sensation: Secondary | ICD-10-CM | POA: Insufficient documentation

## 2010-12-11 DIAGNOSIS — R358 Other polyuria: Secondary | ICD-10-CM | POA: Insufficient documentation

## 2010-12-11 DIAGNOSIS — R079 Chest pain, unspecified: Secondary | ICD-10-CM | POA: Insufficient documentation

## 2010-12-11 DIAGNOSIS — Z9889 Other specified postprocedural states: Secondary | ICD-10-CM | POA: Insufficient documentation

## 2010-12-11 DIAGNOSIS — R3589 Other polyuria: Secondary | ICD-10-CM | POA: Insufficient documentation

## 2010-12-11 DIAGNOSIS — R739 Hyperglycemia, unspecified: Secondary | ICD-10-CM

## 2010-12-11 DIAGNOSIS — I10 Essential (primary) hypertension: Secondary | ICD-10-CM | POA: Insufficient documentation

## 2010-12-11 DIAGNOSIS — M7989 Other specified soft tissue disorders: Secondary | ICD-10-CM | POA: Insufficient documentation

## 2010-12-11 HISTORY — DX: Essential (primary) hypertension: I10

## 2010-12-11 LAB — CBC
HCT: 34.1 % — ABNORMAL LOW (ref 36.0–46.0)
MCV: 85.7 fL (ref 78.0–100.0)
Platelets: 438 10*3/uL — ABNORMAL HIGH (ref 150–400)
RBC: 3.98 MIL/uL (ref 3.87–5.11)
RDW: 14.2 % (ref 11.5–15.5)
WBC: 9.4 10*3/uL (ref 4.0–10.5)

## 2010-12-11 LAB — BASIC METABOLIC PANEL
CO2: 24 mEq/L (ref 19–32)
Chloride: 100 mEq/L (ref 96–112)
Creatinine, Ser: 1.13 mg/dL — ABNORMAL HIGH (ref 0.50–1.10)
GFR calc Af Amer: 66 mL/min — ABNORMAL LOW (ref >90)
Sodium: 135 mEq/L (ref 135–145)

## 2010-12-11 LAB — POCT I-STAT TROPONIN I

## 2010-12-11 LAB — TROPONIN I: Troponin I: <0.3 ng/mL (ref <0.30)

## 2010-12-11 MED ORDER — METFORMIN HCL 1000 MG PO TABS: 1000.0000 mg | ORAL_TABLET | Freq: Two times a day (BID) | ORAL | Status: DC

## 2010-12-11 MED ORDER — LISINOPRIL-HYDROCHLOROTHIAZIDE 20-25 MG PO TABS: 1.0000 | ORAL_TABLET | Freq: Every day | ORAL | Status: DC

## 2010-12-11 MED ORDER — ACETAMINOPHEN 325 MG PO TABS
650.0000 mg | ORAL_TABLET | Freq: Once | ORAL | Status: AC
  Administered 2010-12-11: 650 mg via ORAL
  Filled 2010-12-11: qty 2

## 2010-12-11 NOTE — ED Provider Notes (Signed)
History     CSN: [id number] Arrival date & time: 12/11/2010  1:43 PM   First MD Initiated Contact with Patient 12/11/10 1756      Chief Complaint  Patient presents with  . Leg Swelling  . Diabetes    (Consider location/radiation/quality/duration/timing/severity/associated sxs/prior treatment) Patient is a 45 y.o. female presenting with diabetes problem. The history is provided by the patient.  Diabetes She has type 2 diabetes mellitus. Her disease course has been worsening. Pertinent negatives for hypoglycemia include no confusion, dizziness, headaches, hunger, mood changes, nervousness/anxiousness or sleepiness. Associated symptoms include chest pain, foot paresthesias, polydipsia and polyuria. Pertinent negatives for diabetes include no blurred vision, no polyphagia, no visual change, no weakness and no weight loss. (Her chest pain is fleeting, 1-2 seconds, in the bilateral lower rib area. It has been going on for months. It occurs at various times during the day and does not have any specific causes or associations.) There are no hypoglycemic complications. Symptoms are worsening. There are no diabetic complications. Risk factors for coronary artery disease include diabetes mellitus and dyslipidemia. When asked about current treatments, none were reported. She is compliant with treatment none of the time. Her weight is stable. She is following a diabetic diet. When asked about meal planning, she reported none. She has not had a previous visit with a dietician. She never participates in exercise. There is no compliance with monitoring of blood glucose. An ACE inhibitor/angiotensin II receptor blocker is not being taken. She does not see a podiatrist.Eye exam is not current.    Past Medical History  Diagnosis Date  . Diabetes mellitus   . Hypertension     Past Surgical History  Procedure Date  . Cardiac surgery     No family history on file.  History  Substance Use Topics  .  Smoking status: Current Everyday Smoker  . Smokeless tobacco: Not on file  . Alcohol Use: No    OB History    Grav Para Term Preterm Abortions TAB SAB Ect Mult Living                  Review of Systems  Constitutional: Negative for weight loss.  Eyes: Negative for blurred vision.  Cardiovascular: Positive for chest pain.  Genitourinary: Positive for polyuria.  Neurological: Negative for dizziness, weakness and headaches.  Hematological: Positive for polydipsia. Negative for polyphagia.  Psychiatric/Behavioral: Negative for confusion. The patient is not nervous/anxious.   All other systems reviewed and are negative.    Allergies  Review of patient's allergies indicates no known allergies.  Home Medications   Current Outpatient Rx  Name Route Sig Dispense Refill  . ASPIRIN EC 81 MG PO TBEC Oral Take 81 mg by mouth every morning.     Marland Kitchen CINNAMON PO Oral Take 2 tablets by mouth every morning.        BP 179/95  Pulse 89  Temp(Src) 98.3 F (36.8 C) (Oral)  Resp 20  Ht 5\' 9"  (1.753 m)  Wt 236 lb (107.049 kg)  BMI 34.85 kg/m2  SpO2 100%  Physical Exam  Constitutional: She is oriented to person, place, and time. She appears well-developed and well-nourished.  HENT:  Head: Normocephalic and atraumatic.  Eyes: Conjunctivae and EOM are normal. Pupils are equal, round, and reactive to light.  Neck: Normal range of motion.  Cardiovascular: Normal rate, regular rhythm and normal heart sounds.   Pulmonary/Chest: Effort normal and breath sounds normal.  Abdominal: Soft. Bowel sounds are normal.  Musculoskeletal:  Normal range of motion. She exhibits edema.       Lower leg edema bilaterally, 2+, to the knees  Neurological: She is alert and oriented to person, place, and time. She has normal reflexes.  Skin: Skin is warm.  Psychiatric: She has a normal mood and affect. Her behavior is normal. Judgment normal.    ED Course  Procedures (including critical care time)  Date:  12/11/2010  Rate: 99  Rhythm: normal sinus rhythm  QRS Axis: normal  Intervals: normal  ST/T Wave abnormalities: normal  Conduction Disutrbances:none  Narrative Interpretation: poor R wave progression  Old EKG Reviewed: none available  Labs Reviewed  GLUCOSE, CAPILLARY - Abnormal; Notable for the following:    Glucose-Capillary 212 (*)    All other components within normal limits  BASIC METABOLIC PANEL - Abnormal; Notable for the following:    Potassium 3.3 (*)    Glucose, Bld 143 (*)    Creatinine, Ser 1.13 (*)    GFR calc non Af Amer 57 (*)    GFR calc Af Amer 66 (*)    All other components within normal limits  CBC - Abnormal; Notable for the following:    Hemoglobin 11.7 (*)    HCT 34.1 (*)    Platelets 438 (*)    All other components within normal limits  POCT I-STAT TROPONIN I - Abnormal; Notable for the following:    Troponin i, poc 0.10 (*)    All other components within normal limits  POCT CBG MONITORING  I-STAT TROPONIN I   No results found.   No diagnosis found.    MDM  Untreated diabetes and hypertension, stable for discharge with outpatient management. She is restarted on her usual medications with the addition of hydrochlorothiazide to help her peripheral edema. She is referred for outpatient followup with the PCP coverage        Richarda Blade, MD 12/12/10 [phone]

## 2010-12-11 NOTE — ED Notes (Signed)
Pain currently 7/10 achy chest pain with bilateral burning feet pain.

## 2010-12-11 NOTE — ED Notes (Signed)
Patient states lost her job one year ago and have been having a hard time affording medications for blood pressure and diabetes.  Patient here for evaluation for bilateral lower extremity swelling right stated more tight then left per patient edema bilateral feet +2 bilateral with pedal pulses +2 bilateral full sensation. States having intermittent chest pain location right and left ribs.  Airway intact bilateral equal chest rise and fall. No distress. Patient watching TV call light in place blanket and phone given to patient.

## 2010-12-11 NOTE — ED Notes (Signed)
Patient has been out of her meds due to no job.  She has hx of htn and diabetes.  She states she has noted onset of burning in her legs and she has swelling in her legs.  Patient reports she is also having chest pain and shortness of breath.  She states it seems that her heart rate is irregular

## 2010-12-11 NOTE — ED Notes (Signed)
Pt given sandwich and drink per EDP.

## 2010-12-11 NOTE — ED Notes (Signed)
Pt told to return for new or worse syptoms. Pt told to follow up with pcp.

## 2011-01-13 ENCOUNTER — Encounter (HOSPITAL_COMMUNITY): Payer: Self-pay

## 2011-01-13 ENCOUNTER — Emergency Department (HOSPITAL_COMMUNITY): Payer: Self-pay

## 2011-01-13 ENCOUNTER — Emergency Department (HOSPITAL_COMMUNITY)
Admission: EM | Admit: 2011-01-13 | Discharge: 2011-01-13 | Disposition: A | Discharge status: Home / Self Care | Payer: Self-pay | Attending: Emergency Medicine | Admitting: Emergency Medicine

## 2011-01-13 ENCOUNTER — Other Ambulatory Visit: Payer: Self-pay

## 2011-01-13 DIAGNOSIS — F17200 Nicotine dependence, unspecified, uncomplicated: Secondary | ICD-10-CM | POA: Insufficient documentation

## 2011-01-13 DIAGNOSIS — IMO0001 Reserved for inherently not codable concepts without codable children: Secondary | ICD-10-CM | POA: Insufficient documentation

## 2011-01-13 DIAGNOSIS — R739 Hyperglycemia, unspecified: Secondary | ICD-10-CM

## 2011-01-13 DIAGNOSIS — R079 Chest pain, unspecified: Secondary | ICD-10-CM | POA: Insufficient documentation

## 2011-01-13 DIAGNOSIS — I10 Essential (primary) hypertension: Secondary | ICD-10-CM | POA: Insufficient documentation

## 2011-01-13 DIAGNOSIS — R209 Unspecified disturbances of skin sensation: Secondary | ICD-10-CM | POA: Insufficient documentation

## 2011-01-13 LAB — URINE MICROSCOPIC-ADD ON

## 2011-01-13 LAB — TROPONIN I: Troponin I: <0.3 ng/mL (ref <0.30)

## 2011-01-13 LAB — URINALYSIS, ROUTINE W REFLEX MICROSCOPIC
Leukocytes, UA: NEGATIVE
Nitrite: NEGATIVE
Protein, ur: >300 mg/dL — AB
Specific Gravity, Urine: 1.019 (ref 1.005–1.030)
Urobilinogen, UA: 0.2 mg/dL (ref 0.0–1.0)

## 2011-01-13 LAB — DIFFERENTIAL
Basophils Relative: 1 % (ref 0–1)
Eosinophils Absolute: 0.2 10*3/uL (ref 0.0–0.7)
Neutro Abs: 3.8 10*3/uL (ref 1.7–7.7)
Neutrophils Relative %: 60 % (ref 43–77)

## 2011-01-13 LAB — POCT I-STAT, CHEM 8
HCT: 36 % (ref 36.0–46.0)
Hemoglobin: 12.2 g/dL (ref 12.0–15.0)
Potassium: 3.8 mEq/L (ref 3.5–5.1)
Sodium: 136 mEq/L (ref 135–145)
TCO2: 25 mmol/L (ref 0–100)

## 2011-01-13 LAB — CBC
Hemoglobin: 12.1 g/dL (ref 12.0–15.0)
MCH: 29 pg (ref 26.0–34.0)
MCHC: 34.6 g/dL (ref 30.0–36.0)
Platelets: 504 10*3/uL — ABNORMAL HIGH (ref 150–400)
RBC: 4.17 MIL/uL (ref 3.87–5.11)

## 2011-01-13 LAB — POCT I-STAT TROPONIN I

## 2011-01-13 MED ORDER — SODIUM CHLORIDE 0.9 % IV BOLUS (SEPSIS): 1000.0000 mL | Freq: Once | INTRAVENOUS | Status: DC

## 2011-01-13 MED ORDER — SODIUM CHLORIDE 0.9 % IV BOLUS (SEPSIS)
1000.0000 mL | Freq: Once | INTRAVENOUS | Status: AC
  Administered 2011-01-13: 1000 mL via INTRAVENOUS

## 2011-01-13 NOTE — ED Provider Notes (Signed)
12:01 PM  Date: 01/13/2011  Rate:98  Rhythm: normal sinus rhythm  QRS Axis: normal  Intervals: normal  ST/T Wave abnormalities: nonspecific T wave changes  Conduction Disutrbances:none  Narrative Interpretation: Abnormal EKG  Old EKG Reviewed: unchanged    Mylinda Latina III, MD 01/13/11 1202

## 2011-01-13 NOTE — ED Notes (Signed)
C/o right sided leg heaviness and numbness and right sided chest pain that radiates to right under arm. Pain is dexcribed as a numbness and tingling lasting for two weeks. Pain is worse with touch. C/o decreased appetitie.

## 2011-01-13 NOTE — ED Provider Notes (Signed)
History    this is a 45 year old female with history of hypertension and diabetes (both are not well controlled) presenting to the ED with multiple complaints. Patient states for the past week she has been experiencing pain to her right chest.  Patient described pain as sharp, and burning, worsening with palpation. Pain occasionally radiates to the right axillary region. She denies shortness of breath, palpitation, nausea or vomiting, or diaphoresis with it. She denies exertional component. She admits to being a smoker but denies increased cough or productive cough.  Patient also has been experiencing numbness sensation for the past week. Patient states sensations affecting her right side more so than left but she does express numbness to the upper and lower extremities. Patient states her left leg is heavier as usual. She denies neck pain, or low back pain. She denies lower extremity swelling.  Patient denies change in vision, blurry vision, increased thirst, or increased urination. She does notice unintentional weight loss, and states she has low appetite. She is currently taking metformin and lisinopril but believes they have not been adequate enough.  CSN: [id number] Arrival date & time: 01/13/2011 10:19 AM   First MD Initiated Contact with Patient 01/13/11 1053      Chief Complaint  Patient presents with  . Numbness    (Consider location/radiation/quality/duration/timing/severity/associated sxs/prior treatment) HPI  Past Medical History  Diagnosis Date  . Diabetes mellitus   . Hypertension     Past Surgical History  Procedure Date  . Cardiac surgery     No family history on file.  History  Substance Use Topics  . Smoking status: Current Everyday Smoker  . Smokeless tobacco: Not on file  . Alcohol Use: No    OB History    Grav Para Term Preterm Abortions TAB SAB Ect Mult Living                  Review of Systems  All other systems reviewed and are  negative.    Allergies  Review of patient's allergies indicates no known allergies.  Home Medications   Current Outpatient Rx  Name Route Sig Dispense Refill  . ASPIRIN EC 81 MG PO TBEC Oral Take 81 mg by mouth every morning.     Marland Kitchen CINNAMON PO Oral Take 2 tablets by mouth every morning.      Marland Kitchen LISINOPRIL-HYDROCHLOROTHIAZIDE 20-25 MG PO TABS Oral Take 1 tablet by mouth daily. 90 tablet 0  . METFORMIN HCL 1000 MG PO TABS Oral Take 1 tablet (1,000 mg total) by mouth 2 (two) times daily with a meal. 180 tablet 0    BP 182/99  Pulse 86  Temp(Src) 98.2 F (36.8 C) (Oral)  Resp 18  SpO2 99%  Physical Exam  Nursing note and vitals reviewed. Constitutional: She is oriented to person, place, and time. She appears well-developed and well-nourished.       Awake, alert, nontoxic appearance  HENT:  Head: Atraumatic.  Eyes: Right eye exhibits no discharge. Left eye exhibits no discharge.  Neck: Neck supple.  Cardiovascular: Normal rate and regular rhythm.  Exam reveals no friction rub.   No murmur heard. Pulmonary/Chest: Effort normal. She exhibits tenderness.    Abdominal: There is no tenderness. There is no rebound.  Musculoskeletal: Normal range of motion. She exhibits no edema and no tenderness.       Baseline ROM, no obvious new focal weakness  Neurological: She is alert and oriented to person, place, and time. She exhibits normal muscle tone. Coordination  normal.       Mental status and motor strength appears baseline for patient and situation  Unable to elicit patellar deep tendon reflex bilaterally.  No foot drop noted.  Skin: No rash noted.  Psychiatric: She has a normal mood and affect.    ED Course  Procedures (including critical care time)   Labs Reviewed  POCT CBG MONITORING   No results found.   No diagnosis found.  Doctor Monia Pouch has seen ECG and has documented it.    Date: 01/13/2011  Rate: 98  Rhythm: normal sinus rhythm  QRS Axis: normal   Intervals: normal  ST/T Wave abnormalities: nonspecific T wave changes  Conduction Disutrbances:none  Narrative Interpretation:   Old EKG Reviewed: changes noted    MDM  This is a pt with uncontrolled diabetes and HTN.  Her symptoms is ongoing x 1 week.  Her CBG today is 248.  Her UA shows moderate hemoglobin and pt did admit to having her LMP last week.    3:20 PM Pt has been evaluated by my attending.  At this time her sxs is chronic and my attending thinks she would benefit from primary care management.  She is safe to go home.  She has 2 normal troponin.  Her CBG improves to 158 with IV fluid.  Pt currently in no acute distress.  Will discharge with f/u instruction.        Domenic Moras, PA 01/13/11 [phone]

## 2011-01-14 LAB — GLUCOSE, CAPILLARY: Glucose-Capillary: 263 mg/dL — ABNORMAL HIGH (ref 70–99)
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
## 2011-01-29 ENCOUNTER — Emergency Department (HOSPITAL_COMMUNITY): Payer: Medicaid Other

## 2011-01-29 ENCOUNTER — Encounter (HOSPITAL_COMMUNITY): Payer: Self-pay | Admitting: Emergency Medicine

## 2011-01-29 ENCOUNTER — Other Ambulatory Visit: Payer: Self-pay

## 2011-01-29 ENCOUNTER — Inpatient Hospital Stay (HOSPITAL_COMMUNITY)
Admission: EM | Admit: 2011-01-29 | Discharge: 2011-02-01 | DRG: 074 | Disposition: A | Discharge status: Home / Self Care | Payer: Medicaid Other | Attending: Internal Medicine | Admitting: Internal Medicine

## 2011-01-29 DIAGNOSIS — E1149 Type 2 diabetes mellitus with other diabetic neurological complication: Principal | ICD-10-CM | POA: Diagnosis present

## 2011-01-29 DIAGNOSIS — E1142 Type 2 diabetes mellitus with diabetic polyneuropathy: Secondary | ICD-10-CM | POA: Diagnosis present

## 2011-01-29 DIAGNOSIS — E872 Acidosis, unspecified: Secondary | ICD-10-CM | POA: Diagnosis present

## 2011-01-29 DIAGNOSIS — E1122 Type 2 diabetes mellitus with diabetic chronic kidney disease: Secondary | ICD-10-CM | POA: Diagnosis present

## 2011-01-29 DIAGNOSIS — R079 Chest pain, unspecified: Secondary | ICD-10-CM | POA: Diagnosis present

## 2011-01-29 DIAGNOSIS — R29898 Other symptoms and signs involving the musculoskeletal system: Secondary | ICD-10-CM | POA: Diagnosis present

## 2011-01-29 DIAGNOSIS — R112 Nausea with vomiting, unspecified: Secondary | ICD-10-CM | POA: Diagnosis present

## 2011-01-29 DIAGNOSIS — F17200 Nicotine dependence, unspecified, uncomplicated: Secondary | ICD-10-CM | POA: Diagnosis present

## 2011-01-29 DIAGNOSIS — N179 Acute kidney failure, unspecified: Secondary | ICD-10-CM | POA: Diagnosis present

## 2011-01-29 DIAGNOSIS — K219 Gastro-esophageal reflux disease without esophagitis: Secondary | ICD-10-CM | POA: Diagnosis present

## 2011-01-29 DIAGNOSIS — R531 Weakness: Secondary | ICD-10-CM

## 2011-01-29 DIAGNOSIS — E785 Hyperlipidemia, unspecified: Secondary | ICD-10-CM | POA: Diagnosis present

## 2011-01-29 DIAGNOSIS — E1159 Type 2 diabetes mellitus with other circulatory complications: Secondary | ICD-10-CM | POA: Diagnosis present

## 2011-01-29 DIAGNOSIS — E1169 Type 2 diabetes mellitus with other specified complication: Secondary | ICD-10-CM | POA: Diagnosis present

## 2011-01-29 DIAGNOSIS — I10 Essential (primary) hypertension: Secondary | ICD-10-CM | POA: Diagnosis present

## 2011-01-29 DIAGNOSIS — R209 Unspecified disturbances of skin sensation: Secondary | ICD-10-CM | POA: Diagnosis present

## 2011-01-29 DIAGNOSIS — G629 Polyneuropathy, unspecified: Secondary | ICD-10-CM | POA: Diagnosis present

## 2011-01-29 DIAGNOSIS — E1165 Type 2 diabetes mellitus with hyperglycemia: Secondary | ICD-10-CM | POA: Diagnosis present

## 2011-01-29 HISTORY — DX: Pericardial effusion (noninflammatory): I31.3

## 2011-01-29 HISTORY — DX: Other pericardial effusion (noninflammatory): I31.39

## 2011-01-29 HISTORY — DX: Polyneuropathy, unspecified: G62.9

## 2011-01-29 HISTORY — DX: Unspecified osteoarthritis, unspecified site: M19.90

## 2011-01-29 HISTORY — DX: Anemia, unspecified: D64.9

## 2011-01-29 HISTORY — DX: Headache: R51

## 2011-01-29 HISTORY — DX: Pneumonia, unspecified organism: J18.9

## 2011-01-29 LAB — COMPREHENSIVE METABOLIC PANEL
AST: 12 U/L (ref 0–37)
BUN: 20 mg/dL (ref 6–23)
CO2: 24 mEq/L (ref 19–32)
Chloride: 98 mEq/L (ref 96–112)
Creatinine, Ser: 1.29 mg/dL — ABNORMAL HIGH (ref 0.50–1.10)
GFR calc Af Amer: 57 mL/min — ABNORMAL LOW (ref >90)
GFR calc non Af Amer: 49 mL/min — ABNORMAL LOW (ref >90)
Glucose, Bld: 271 mg/dL — ABNORMAL HIGH (ref 70–99)
Total Bilirubin: 0.2 mg/dL — ABNORMAL LOW (ref 0.3–1.2)

## 2011-01-29 LAB — CARDIAC PANEL(CRET KIN+CKTOT+MB+TROPI): Relative Index: 2.3 (ref 0.0–2.5)

## 2011-01-29 LAB — URINALYSIS, ROUTINE W REFLEX MICROSCOPIC
Protein, ur: >300 mg/dL — AB
Urobilinogen, UA: 0.2 mg/dL (ref 0.0–1.0)

## 2011-01-29 LAB — DIFFERENTIAL
Lymphocytes Relative: 27 % (ref 12–46)
Lymphs Abs: 2.2 10*3/uL (ref 0.7–4.0)
Monocytes Absolute: 0.6 10*3/uL (ref 0.1–1.0)
Monocytes Relative: 7 % (ref 3–12)
Neutro Abs: 5.2 10*3/uL (ref 1.7–7.7)

## 2011-01-29 LAB — CBC
HCT: 32.1 % — ABNORMAL LOW (ref 36.0–46.0)
Hemoglobin: 11.3 g/dL — ABNORMAL LOW (ref 12.0–15.0)
MCV: 83.8 fL (ref 78.0–100.0)
WBC: 8.2 10*3/uL (ref 4.0–10.5)

## 2011-01-29 LAB — LACTIC ACID, PLASMA
Lactic Acid, Venous: 3.3 mmol/L — ABNORMAL HIGH (ref 0.5–2.2)
Lactic Acid, Venous: 2.1 mmol/L (ref 0.5–2.2)

## 2011-01-29 MED ORDER — OXYCODONE-ACETAMINOPHEN 5-325 MG PO TABS: 2.0000 | ORAL_TABLET | ORAL | Status: AC | PRN

## 2011-01-29 MED ORDER — HYDROMORPHONE HCL PF 1 MG/ML IJ SOLN
1.0000 mg | Freq: Once | INTRAMUSCULAR | Status: AC
  Administered 2011-01-29: 1 mg via INTRAVENOUS
  Filled 2011-01-29: qty 1

## 2011-01-29 MED ORDER — SODIUM CHLORIDE 0.9 % IV SOLN: INTRAVENOUS | Status: AC

## 2011-01-29 MED ORDER — METOPROLOL TARTRATE 50 MG PO TABS
50.0000 mg | ORAL_TABLET | Freq: Two times a day (BID) | ORAL | Status: DC
  Administered 2011-01-30 – 2011-02-01 (×6): 50 mg via ORAL
  Filled 2011-01-29: qty 1
  Filled 2011-01-29: qty 2
  Filled 2011-01-29 (×7): qty 1

## 2011-01-29 MED ORDER — SODIUM CHLORIDE 0.9 % IV BOLUS (SEPSIS)
1000.0000 mL | Freq: Once | INTRAVENOUS | Status: AC
  Administered 2011-01-29: 1000 mL via INTRAVENOUS

## 2011-01-29 MED ORDER — INSULIN ASPART 100 UNIT/ML ~~LOC~~ SOLN
0.0000 [IU] | Freq: Three times a day (TID) | SUBCUTANEOUS | Status: DC
  Administered 2011-01-30: 3 [IU] via SUBCUTANEOUS
  Administered 2011-01-30: 5 [IU] via SUBCUTANEOUS
  Administered 2011-01-30 – 2011-01-31 (×3): 3 [IU] via SUBCUTANEOUS
  Administered 2011-01-31 – 2011-02-01 (×2): 5 [IU] via SUBCUTANEOUS
  Administered 2011-02-01: 3 [IU] via SUBCUTANEOUS
  Filled 2011-01-29: qty 3

## 2011-01-29 MED ORDER — LORAZEPAM 2 MG/ML IJ SOLN
1.0000 mg | Freq: Once | INTRAMUSCULAR | Status: AC
  Administered 2011-01-29: 1 mg via INTRAVENOUS
  Filled 2011-01-29: qty 1

## 2011-01-29 MED ORDER — AMLODIPINE BESYLATE 5 MG PO TABS
5.0000 mg | ORAL_TABLET | Freq: Every day | ORAL | Status: DC
  Administered 2011-01-29 – 2011-01-30 (×2): 5 mg via ORAL
  Filled 2011-01-29 (×2): qty 1

## 2011-01-29 MED ORDER — ONDANSETRON HCL 4 MG/2ML IJ SOLN
4.0000 mg | Freq: Once | INTRAMUSCULAR | Status: AC
  Administered 2011-01-29: 4 mg via INTRAVENOUS

## 2011-01-29 MED ORDER — ONDANSETRON HCL 4 MG/2ML IJ SOLN
INTRAMUSCULAR | Status: AC
  Filled 2011-01-29: qty 2

## 2011-01-29 NOTE — ED Notes (Signed)
Dr Allen at bedside  

## 2011-01-29 NOTE — ED Notes (Signed)
Pt states that she has lost her job and has not been able to get her diabetic meds for the past yr and was seen last week for the same thing, not able to get back on her feet states that she just does not feel well.

## 2011-01-29 NOTE — ED Provider Notes (Addendum)
History     CSN: [id number]  Arrival date & time 01/29/11  1333   First MD Initiated Contact with Patient 01/29/11 1512      Chief Complaint  Patient presents with  . Generalized Body Aches  . Blurred Vision    (Consider location/radiation/quality/duration/timing/severity/associated sxs/prior treatment) The history is provided by the patient.   Pt c/o weakness x 3 months all over her body--nothing makes sx better or worse--seen here recently and tx for hyperglycemia--no fever, vomiting, diarrhea--no anginal type chest pain or dyspnes--no rashes--no meds used pta--denies headaches or syncope-- Past Medical History  Diagnosis Date  . Diabetes mellitus   . Hypertension     Past Surgical History  Procedure Date  . Cardiac surgery     No family history on file.  History  Substance Use Topics  . Smoking status: Current Everyday Smoker  . Smokeless tobacco: Not on file  . Alcohol Use: No    OB History    Grav Para Term Preterm Abortions TAB SAB Ect Mult Living                  Review of Systems  All other systems reviewed and are negative.    Allergies  Review of patient's allergies indicates no known allergies.  Home Medications   Current Outpatient Rx  Name Route Sig Dispense Refill  . ASPIRIN EC 81 MG PO TBEC Oral Take 81 mg by mouth every morning.     Marland Kitchen CINNAMON PO Oral Take 2 tablets by mouth every morning.      Marland Kitchen LISINOPRIL-HYDROCHLOROTHIAZIDE 20-25 MG PO TABS Oral Take 1 tablet by mouth daily. 90 tablet 0  . METFORMIN HCL 1000 MG PO TABS Oral Take 1 tablet (1,000 mg total) by mouth 2 (two) times daily with a meal. 180 tablet 0    BP 161/97  Pulse 101  Temp(Src) 98.5 F (36.9 C) (Oral)  Resp 20  SpO2 96%  Physical Exam  Nursing note and vitals reviewed. Constitutional: She is oriented to person, place, and time. She appears well-developed and well-nourished.  Non-toxic appearance. No distress.  HENT:  Head: Normocephalic and atraumatic.    Eyes: Conjunctivae, EOM and lids are normal. Pupils are equal, round, and reactive to light.  Neck: Normal range of motion. Neck supple. No tracheal deviation present. No mass present.  Cardiovascular: Normal heart sounds.  Tachycardia present.  Exam reveals no gallop.   No murmur heard. Pulmonary/Chest: Effort normal and breath sounds normal. No stridor. No respiratory distress. She has no decreased breath sounds. She has no wheezes. She has no rhonchi. She has no rales.  Abdominal: Soft. Normal appearance and bowel sounds are normal. She exhibits no distension. There is no tenderness. There is no rebound and no CVA tenderness.  Musculoskeletal: Normal range of motion. She exhibits no edema and no tenderness.  Neurological: She is alert and oriented to person, place, and time. She has normal strength. No cranial nerve deficit or sensory deficit. GCS eye subscore is 4. GCS verbal subscore is 5. GCS motor subscore is 6.  Skin: Skin is warm and dry. No abrasion and no rash noted.  Psychiatric: She has a normal mood and affect. Her speech is normal and behavior is normal.    ED Course  Procedures (including critical care time)  Labs Reviewed  GLUCOSE, CAPILLARY - Abnormal; Notable for the following:    Glucose-Capillary 273 (*)    All other components within normal limits  POCT CBG MONITORING  CARDIAC  PANEL(CRET KIN+CKTOT+MB+TROPI)  CBC  DIFFERENTIAL  COMPREHENSIVE METABOLIC PANEL  URINALYSIS, ROUTINE W REFLEX MICROSCOPIC   No results found.   No diagnosis found.    MDM  Patient given IV fluids and pain medication here. Patient test repeated and was within normal limits. Suspect the patient did have some dehydration. Blood pressure noted and she will take her antihypertensive when she gets home. Patient instructed to followup with her doctors        Leota Jacobsen, MD 01/29/11 2042  10:41 PM Pt was to be discharged to home, however, after speaking her sister, she states  that her weakness is getting worse and she is having trouble driving--concern for possible multiple sclerosis, spoke with [facility], pt to be admitted  Leota Jacobsen, MD 01/29/11 2243

## 2011-01-29 NOTE — H&P (Addendum)
[name] [id number]  Outpatient Primary MD for the patient is No primary provider on file.  With History of -  Past Medical History  Diagnosis Date  . Diabetes mellitus   . Hypertension   . Pericardial effusion       Past Surgical History  Procedure Date  . Cardiac surgery     in for   Chief Complaint  Patient presents with  . Generalized Body Aches  . Blurred Vision     HPI  [name]  is a 45 y.o. female,  with history of type 2 diabetes mellitus, hypertension remote history of pericardial effusion with pericardial window placement, who does not take any medications due to insurance problems, comes in with 3 month history of generalized body aches and pains, fatigue, also says that she has been feeling her right leg has been getting heavy over the past several months, she states she has difficulty lifting it and when she walks she walks with a slight limp, she says that her right arm also is somewhat tingly from time to time, all the symptoms have been present for several weeks if not months, she came to the ER where her head CT and chest x-ray were normal her blood work was consistent with renal insufficiency and lactic acidosis , while in the ER patient had nausea and vomiting times one without any abdominal pain, and I was called to admit the patient.   Review of Systems    In addition to the HPI above,   No Fever-chills, No Headache, No changes with Vision or hearing, No problems swallowing food or Liquids, No Chest pain, Cough or Shortness of Breath, No Abdominal pain, Bowel movements are regular, No Blood in stool or Urine, No dysuria, No new skin rashes or bruises, No recent weight gain or loss, No polyuria, polydypsia or polyphagia, No significant Mental Stressors.  A full 10 point Review of Systems was done, except as stated above, all other Review of Systems were negative.   Social History History  Substance Use Topics  .  Smoking status: Current Everyday Smoker  . Smokeless tobacco: Not on file  . Alcohol Use: No      Family History DM  Prior to Admission medications   Medication Sig Start Date End Date Taking? Authorizing Provider  aspirin EC 81 MG tablet Take 81 mg by mouth every morning.    Yes Historical Provider, MD  CINNAMON PO Take 2 tablets by mouth every morning.     Yes Historical Provider, MD  lisinopril-hydrochlorothiazide (PRINZIDE,ZESTORETIC) 20-25 MG per tablet Take 1 tablet by mouth daily. 12/11/10 12/11/11 Yes Richarda Blade, MD  metFORMIN (GLUCOPHAGE) 1000 MG tablet Take 1 tablet (1,000 mg total) by mouth 2 (two) times daily with a meal. 12/11/10 12/11/11 Yes Richarda Blade, MD  oxyCODONE-acetaminophen (PERCOCET) 5-325 MG per tablet Take 2 tablets by mouth every 4 (four) hours as needed for pain. 01/29/11 02/08/11  Leota Jacobsen, MD    No Known Allergies  Physical Exam No intake or output data in the 24 hours ending 01/29/11 2229 Blood pressure 191/89, pulse 101, temperature 99.5 F (37.5 C), temperature source Oral, resp. rate 20, SpO2 100.00%.   1. General  Obese AA femlae lying in bed in NAD,     2. Normal affect and insight, Not Suicidal or Homicidal, Awake Alert, Oriented *3.  3. No F.N deficits, ALL C.Nerves Intact, Strength 3/5Rt Leg, 5/5 Rt arm, Sensation intact all 4 extremities, Plantars down going.  4. Ears and Eyes appear Normal, Conjunctivae clear, PERRLA. Moist Oral Mucosa.  5. Supple Neck, No JVD, No cervical lymphadenopathy appriciated, No Carotid Bruits.  6. Symmetrical Chest wall movement, Good air movement bilaterally, CTAB.  7. RRR, No Gallops, Rubs or Murmurs, No Parasternal Heave.  8. Positive Bowel Sounds, Abdomen Soft, Non tender, No organomegaly appriciated,       No rebound -guarding or rigidity.  9.  No Cyanosis, Normal Skin Turgor, No Skin Rash or Bruise.  10. Good muscle tone,  joints appear normal , no effusions, Normal ROM.  11. No Palpable  Lymph Nodes in Neck or Axillae     Data Review  CBC  Lab 01/29/11 1539  WBC 8.2  HGB 11.3*  HCT 32.1*  PLT 420*  MCV 83.8  MCH 29.5  MCHC 35.2  RDW 13.4  LYMPHSABS 2.2  MONOABS 0.6  EOSABS 0.2  BASOSABS 0.0  BANDABS --   ------------------------------------------------------------------------------------------------------------------ Chemistries   Lab 01/29/11 1539  NA 134*  [name] 3.5  CL 98  CO2 24  GLUCOSE 271*  BUN 20  CREATININE 1.29*  CALCIUM 9.3  MG --  AST 12  ALT 12  ALKPHOS 55  BILITOT 0.2*   ------------------------------------------------------------------------------------------------------------------ CrCl is unknown because both a height and weight (above a minimum accepted value) are required for this calculation. ------------------------------------------------------------------------------------------------------------------ No results found for this basename: TSH,T4TOTAL,FREET3,T3FREE,THYROIDAB in the last 72 hours  Coagulation profile No results found for this basename: INR:5,PROTIME:5 in the last 168 hours ------------------------------------------------------------------------------------------------------------------- No results found for this basename: DDIMER:2 in the last 72 hours ------------------------------------------------------------------------------------------------------------------- Cardiac Enzymes  Lab 01/29/11 1539  CKMB 3.7  TROPONINI <0.30  MYOGLOBIN --   ------------------------------------------------------------------------------------------------------------------ No components found with this basename: POCBNP:3 ------------------------------------------------------------------------------------------------------------------  Imaging results:   Dg Chest 2 View  01/29/2011  *RADIOLOGY REPORT*  Clinical Data: Chest pain  CHEST - 2 VIEW  Comparison: 01/13/2011  Findings: The heart is moderately enlarged.  Lungs are  clear.  No pneumothorax and no pleural effusion.  Pulmonary vascularity is within normal limits.  IMPRESSION: Cardiomegaly without decompensation.  Original Report Authenticated By: Jamas Lav, M.D.   Ct Head Wo Contrast  01/29/2011  *RADIOLOGY REPORT*  Clinical Data: Headaches.  Lower extremity weakness.  CT HEAD WITHOUT CONTRAST 01/29/2011:  Technique:  Contiguous axial images were obtained from the base of the skull through the vertex without contrast.  Comparison: None.  Findings: Head tilt in the gantry accounts for apparent asymmetry in the cerebral hemispheres. Ventricular system normal in size and appearance for age.  No mass lesion.  No midline shift.  No acute hemorrhage or hematoma.  No extra-axial fluid collections.  No evidence of acute infarction.  No focal brain parenchymal abnormalities.  No focal osseous abnormalities involving the skull.  Visualized paranasal sinuses, mastoid air cells, and middle ear cavities well- aerated.  Bilateral carotid siphon atherosclerosis, left greater than right.  IMPRESSION:  1.  No acute intracranial abnormality. 2.  Carotid siphon atherosclerosis, left greater than right.  Original Report Authenticated By: Deniece Portela, M.D.    EKG NSR, 91/min, Non specific T wave changes legs V5-6 similar to 01-14-11 EKG   Assessment & Plan  1. Right leg weakness, right arm tingling sensation ongoing for several weeks- most likely diabetic neuropathy, CT of the brain is negative, will obtain MRI of the brain, Lipid profile, A1c,  PT and OT evaluation, if all negative consider neurology consultation, may need L-spine MRI.  2. Diabetes mellitus type 2 he should  intermittently on Glucophage with lactic acidosis. Will DC Glucophage, will check A1c and initiate sliding scale insulin.  3. Poorly controlled hypertension in the light of renal insufficiency will hold her ACE inhibitor we'll place her on a beta blocker along with Norvasc.  4. Acute renal  insufficiency -We'll gently hydrate hold ACE inhibitors and monitor.  5. Nausea and vomiting in the ER x1 abdominal exam unremarkable supportive care with Zofran and IV fluids, will check KUB .  6. History of smoking patient counseled to quit smoking.  7. Generalized body aches probably due to poorly controlled diabetes mellitus, lactic acidosis. Her CK levels are normal, we'll check TSH and A1c.   DVT Prophylaxis Heparin    AM Labs Ordered, also please review Full Orders  Admission, patients condition and plan of care including tests being ordered have been discussed with the patient   who indicates understanding and agree with the plan and Code Status.  Code Status Full  Condition [name] M.D on 01/29/2011 at 10:29 PM  [facility] Office  [phone]

## 2011-01-29 NOTE — ED Notes (Signed)
Dr. Zenia Resides notified of pt's continued pain. No new orders received at this time.

## 2011-01-29 NOTE — ED Notes (Signed)
Pt is A/O x4. Skin warm and dry. Respirations even and unlabored. NAD noted at this time.

## 2011-01-30 ENCOUNTER — Encounter (HOSPITAL_COMMUNITY): Payer: Self-pay | Admitting: *Deleted

## 2011-01-30 ENCOUNTER — Inpatient Hospital Stay (HOSPITAL_COMMUNITY): Payer: Medicaid Other

## 2011-01-30 DIAGNOSIS — E785 Hyperlipidemia, unspecified: Secondary | ICD-10-CM | POA: Diagnosis present

## 2011-01-30 DIAGNOSIS — E1169 Type 2 diabetes mellitus with other specified complication: Secondary | ICD-10-CM | POA: Diagnosis present

## 2011-01-30 LAB — BASIC METABOLIC PANEL
CO2: 23 mEq/L (ref 19–32)
Glucose, Bld: 328 mg/dL — ABNORMAL HIGH (ref 70–99)
Potassium: 3.5 mEq/L (ref 3.5–5.1)
Sodium: 135 mEq/L (ref 135–145)

## 2011-01-30 LAB — CBC
Hemoglobin: 10.4 g/dL — ABNORMAL LOW (ref 12.0–15.0)
MCH: 28.9 pg (ref 26.0–34.0)
MCV: 84.2 fL (ref 78.0–100.0)
RBC: 3.6 MIL/uL — ABNORMAL LOW (ref 3.87–5.11)

## 2011-01-30 LAB — PROTIME-INR: INR: 0.9 (ref 0.00–1.49)

## 2011-01-30 LAB — LIPID PANEL
Cholesterol: 418 mg/dL — ABNORMAL HIGH (ref 0–200)
Triglycerides: 1046 mg/dL — ABNORMAL HIGH (ref <150)
VLDL: UNDETERMINED mg/dL (ref 0–40)

## 2011-01-30 LAB — TSH: TSH: 1.791 u[IU]/mL (ref 0.350–4.500)

## 2011-01-30 LAB — GLUCOSE, CAPILLARY: Glucose-Capillary: 210 mg/dL — ABNORMAL HIGH (ref 70–99)

## 2011-01-30 MED ORDER — INSULIN ASPART PROT & ASPART (70-30 MIX) 100 UNIT/ML ~~LOC~~ SUSP
12.0000 [IU] | Freq: Two times a day (BID) | SUBCUTANEOUS | Status: DC
  Administered 2011-01-30 – 2011-01-31 (×2): 12 [IU] via SUBCUTANEOUS
  Filled 2011-01-30: qty 3

## 2011-01-30 MED ORDER — GABAPENTIN 600 MG PO TABS: 300.0000 mg | ORAL_TABLET | Freq: Two times a day (BID) | ORAL | Status: DC

## 2011-01-30 MED ORDER — HYDROCODONE-ACETAMINOPHEN 5-325 MG PO TABS
1.0000 | ORAL_TABLET | ORAL | Status: DC | PRN
  Administered 2011-01-30 – 2011-01-31 (×3): 2 via ORAL
  Filled 2011-01-30 (×3): qty 2

## 2011-01-30 MED ORDER — GABAPENTIN 600 MG PO TABS
300.0000 mg | ORAL_TABLET | Freq: Every day | ORAL | Status: DC
  Filled 2011-01-30: qty 0.5

## 2011-01-30 MED ORDER — GABAPENTIN 300 MG PO CAPS
300.0000 mg | ORAL_CAPSULE | Freq: Once | ORAL | Status: AC
  Administered 2011-01-30: 300 mg via ORAL
  Filled 2011-01-30: qty 1

## 2011-01-30 MED ORDER — LORAZEPAM 2 MG/ML IJ SOLN: 1.0000 mg | Freq: Once | INTRAMUSCULAR | Status: AC | PRN

## 2011-01-30 MED ORDER — GABAPENTIN 600 MG PO TABS: 300.0000 mg | ORAL_TABLET | Freq: Three times a day (TID) | ORAL | Status: DC

## 2011-01-30 MED ORDER — ASPIRIN EC 81 MG PO TBEC
81.0000 mg | DELAYED_RELEASE_TABLET | ORAL | Status: DC
  Administered 2011-01-30 – 2011-02-01 (×3): 81 mg via ORAL
  Filled 2011-01-30 (×4): qty 1

## 2011-01-30 MED ORDER — HYDRALAZINE HCL 20 MG/ML IJ SOLN: 10.0000 mg | Freq: Four times a day (QID) | INTRAMUSCULAR | Status: DC | PRN

## 2011-01-30 MED ORDER — NICOTINE 14 MG/24HR TD PT24
14.0000 mg | MEDICATED_PATCH | Freq: Every day | TRANSDERMAL | Status: DC | PRN
  Administered 2011-01-30: 14 mg via TRANSDERMAL
  Filled 2011-01-30: qty 1

## 2011-01-30 MED ORDER — HEPARIN SODIUM (PORCINE) 5000 UNIT/ML IJ SOLN
5000.0000 [IU] | Freq: Three times a day (TID) | INTRAMUSCULAR | Status: DC
  Administered 2011-01-30 – 2011-02-01 (×8): 5000 [IU] via SUBCUTANEOUS
  Filled 2011-01-30 (×14): qty 1

## 2011-01-30 MED ORDER — ONDANSETRON HCL 4 MG PO TABS: 4.0000 mg | ORAL_TABLET | Freq: Four times a day (QID) | ORAL | Status: DC | PRN

## 2011-01-30 MED ORDER — ALBUTEROL SULFATE (5 MG/ML) 0.5% IN NEBU: 2.5000 mg | INHALATION_SOLUTION | RESPIRATORY_TRACT | Status: DC | PRN

## 2011-01-30 MED ORDER — HYDRALAZINE HCL 20 MG/ML IJ SOLN
10.0000 mg | Freq: Once | INTRAMUSCULAR | Status: AC
  Administered 2011-01-30: 10 mg via INTRAVENOUS
  Filled 2011-01-30: qty 1

## 2011-01-30 MED ORDER — AMLODIPINE BESYLATE 5 MG PO TABS
5.0000 mg | ORAL_TABLET | Freq: Once | ORAL | Status: AC
  Administered 2011-01-30: 5 mg via ORAL
  Filled 2011-01-30: qty 1

## 2011-01-30 MED ORDER — AMLODIPINE BESYLATE 10 MG PO TABS
10.0000 mg | ORAL_TABLET | Freq: Every day | ORAL | Status: DC
  Administered 2011-01-31 – 2011-02-01 (×2): 10 mg via ORAL
  Filled 2011-01-30 (×3): qty 1

## 2011-01-30 MED ORDER — GABAPENTIN 300 MG PO CAPS
300.0000 mg | ORAL_CAPSULE | Freq: Two times a day (BID) | ORAL | Status: AC
  Administered 2011-01-31 (×2): 300 mg via ORAL
  Filled 2011-01-30 (×2): qty 1

## 2011-01-30 MED ORDER — LABETALOL HCL 5 MG/ML IV SOLN
10.0000 mg | Freq: Four times a day (QID) | INTRAVENOUS | Status: DC | PRN
  Administered 2011-01-30: 10 mg via INTRAVENOUS
  Filled 2011-01-30: qty 4

## 2011-01-30 MED ORDER — GABAPENTIN 300 MG PO CAPS
300.0000 mg | ORAL_CAPSULE | Freq: Three times a day (TID) | ORAL | Status: DC
  Administered 2011-02-01: 300 mg via ORAL
  Filled 2011-01-30 (×6): qty 1

## 2011-01-30 MED ORDER — HYDRALAZINE HCL 20 MG/ML IJ SOLN
5.0000 mg | Freq: Once | INTRAMUSCULAR | Status: AC
  Administered 2011-01-30: 5 mg via INTRAVENOUS
  Filled 2011-01-30: qty 1

## 2011-01-30 MED ORDER — ONDANSETRON HCL 4 MG/2ML IJ SOLN
4.0000 mg | Freq: Four times a day (QID) | INTRAMUSCULAR | Status: DC | PRN
  Administered 2011-01-31: 4 mg via INTRAVENOUS
  Filled 2011-01-30: qty 2

## 2011-01-30 MED ORDER — GUAIFENESIN-DM 100-10 MG/5ML PO SYRP: 5.0000 mL | ORAL_SOLUTION | ORAL | Status: DC | PRN

## 2011-01-30 MED ORDER — LISINOPRIL 10 MG PO TABS
10.0000 mg | ORAL_TABLET | Freq: Every day | ORAL | Status: DC
  Administered 2011-01-30 – 2011-02-01 (×3): 10 mg via ORAL
  Filled 2011-01-30 (×4): qty 1

## 2011-01-30 NOTE — Progress Notes (Deleted)
CRITICAL VALUE ALERT  Critical value received: Hgb 4.5  Date of notification:  01/30/11  Time of notification:  1500  Critical value read back:yes  Nurse who received alert:  TR  MD notified (1st page):  Sarajane Jews  Time of first page:  1500  MD notified (2nd page):  Time of second page:  Responding MD:  Sarajane Jews  Time MD responded:  [phone]

## 2011-01-30 NOTE — ED Notes (Signed)
Pt transferred to [id number]

## 2011-01-30 NOTE — Progress Notes (Signed)
Bp high on routine rounds.  States has headache- hydrocodone administered.  Will re-eval pain and BP for effectiveness.

## 2011-01-30 NOTE — Progress Notes (Signed)
Transported to MRI via w/c . Pt alert and oriented- states no hx of claustrophobia.  No ADN

## 2011-01-30 NOTE — Progress Notes (Signed)
Subjective: Still having weakness and numbness in right greater than left leg. Denies any pain.  Complained of burning pain in her chest, which she reports is chronic.  Objective: Vital signs in last 24 hours: Filed Vitals:   01/30/11 0700 01/30/11 1030 01/30/11 1143 01/30/11 1400  BP: 167/75 164/83 172/92 169/75  Pulse: 83 91 92 76  Temp:  98.3 F (36.8 C)  97.9 F (36.6 C)  TempSrc:  Oral  Oral  Resp:  18  18  Height:      Weight:      SpO2:  98%  99%   Weight change:  No intake or output data in the 24 hours ending 01/30/11 1708  Physical Exam: General: Awake, Oriented, No acute distress. HEENT: EOMI. Neck: Supple CV: S1 and S2 Lungs: Clear to ascultation bilaterally Abdomen: Soft, Nontender, Nondistended, +bowel sounds. Ext: Good pulses. Trace edema. Neurology: 4/5 motor strength in right lower extremity, 5/5 motor strength in left lower extremity.  Lab Results:  [facility] 01/30/11 0430 01/29/11 1539  NA 135 134*  K 3.5 3.5  CL 99 98  CO2 23 24  GLUCOSE 328* 271*  BUN 17 20  CREATININE 1.14* 1.29*  CALCIUM 9.0 9.3  MG -- --  PHOS -- --    Basename 01/29/11 1539  AST 12  ALT 12  ALKPHOS 55  BILITOT 0.2*  PROT 6.4  ALBUMIN 2.6*   No results found for this basename: LIPASE:2,AMYLASE:2 in the last 72 hours  Basename 01/30/11 0430 01/29/11 1539  WBC 8.2 8.2  NEUTROABS -- 5.2  HGB 10.4* 11.3*  HCT 30.3* 32.1*  MCV 84.2 83.8  PLT 410* 420*    Basename 01/29/11 1539  CKTOTAL 163  CKMB 3.7  CKMBINDEX --  TROPONINI <0.30   No components found with this basename: POCBNP:3 No results found for this basename: DDIMER:2 in the last 72 hours  Basename 01/29/11 1539  HGBA1C 10.2*    Basename 01/30/11 0430  CHOL 418*  HDL 41  LDLCALC UNABLE TO CALCULATE IF TRIGLYCERIDE OVER 400 mg/dL  TRIG 1046*  CHOLHDL 10.2  LDLDIRECT --    Basename 01/30/11 0430  TSH 1.791  T4TOTAL --  T3FREE --  THYROIDAB --   No results found for this basename:  VITAMINB12:2,FOLATE:2,FERRITIN:2,TIBC:2,IRON:2,RETICCTPCT:2 in the last 72 hours  Micro Results: No results found for this or any previous visit (from the past 240 hour(s)).  Studies/Results: Dg Chest 2 View  01/29/2011  *RADIOLOGY REPORT*  Clinical Data: Chest pain  CHEST - 2 VIEW  Comparison: 01/13/2011  Findings: The heart is moderately enlarged.  Lungs are clear.  No pneumothorax and no pleural effusion.  Pulmonary vascularity is within normal limits.  IMPRESSION: Cardiomegaly without decompensation.  Original Report Authenticated By: Jamas Lav, M.D.   Ct Head Wo Contrast  01/29/2011  *RADIOLOGY REPORT*  Clinical Data: Headaches.  Lower extremity weakness.  CT HEAD WITHOUT CONTRAST 01/29/2011:  Technique:  Contiguous axial images were obtained from the base of the skull through the vertex without contrast.  Comparison: None.  Findings: Head tilt in the gantry accounts for apparent asymmetry in the cerebral hemispheres. Ventricular system normal in size and appearance for age.  No mass lesion.  No midline shift.  No acute hemorrhage or hematoma.  No extra-axial fluid collections.  No evidence of acute infarction.  No focal brain parenchymal abnormalities.  No focal osseous abnormalities involving the skull.  Visualized paranasal sinuses, mastoid air cells, and middle ear cavities well- aerated.  Bilateral carotid  siphon atherosclerosis, left greater than right.  IMPRESSION:  1.  No acute intracranial abnormality. 2.  Carotid siphon atherosclerosis, left greater than right.  Original Report Authenticated By: Deniece Portela, M.D.   Dg Abd Portable 1v  01/30/2011  *RADIOLOGY REPORT*  Clinical Data: Nausea and vomiting  ABDOMEN - 1 VIEW  Comparison: CT abdomen pelvis 08/18/2010  Findings: The bowel gas pattern is nonobstructive. 7.8 cm rounded calcified mass projecting over L5-S1 corresponds with the calcified uterine fibroid seen on prior CT.  No acute bony abnormality.  IMPRESSION:  Nonobstructive bowel gas pattern.  Original Report Authenticated By: Curlene Dolphin, M.D.    Medications: I have reviewed the patient's current medications. Scheduled Meds:   . amLODipine  10 mg Oral Daily  . amLODipine  5 mg Oral Once  . aspirin EC  81 mg Oral Q24H  . gabapentin  300 mg Oral Once   Followed by  . gabapentin  300 mg Oral BID   Followed by  . gabapentin  300 mg Oral TID  . heparin  5,000 Units Subcutaneous Q8H  . hydrALAZINE  10 mg Intravenous Once  . hydrALAZINE  5 mg Intravenous Once  . HYDROmorphone  1 mg Intravenous Once  . insulin aspart  0-9 Units Subcutaneous TID WC  . insulin aspart protamine-insulin aspart  12 Units Subcutaneous BID WC  . lisinopril  10 mg Oral Daily  . metoprolol tartrate  50 mg Oral BID  . ondansetron      . ondansetron (ZOFRAN) IV  4 mg Intravenous Once  . sodium chloride  1,000 mL Intravenous Once  . DISCONTD: amLODipine  5 mg Oral Daily  . DISCONTD: gabapentin  300 mg Oral Daily  . DISCONTD: gabapentin  300 mg Oral BID  . DISCONTD: gabapentin  300 mg Oral TID   Continuous Infusions:   . sodium chloride     PRN Meds:.albuterol, guaiFENesin-dextromethorphan, hydrALAZINE, HYDROcodone-acetaminophen, labetalol, LORazepam, nicotine, ondansetron (ZOFRAN) IV, ondansetron  Assessment/Plan: 1. Right leg weakness and paresthesia.  Likely due to diabetic neuropathy.  MRI of the brain pending.  Head CT obtained with results as indicated above.  2. Diabetic neuropathy.  Started the patient on gabapentin and titrate as tolerated.  3. Type II diabetes mellitus with complication, uncontrolled.  Hemoglobin A1c came back as 10.2 suggesting her diabetes is not well controlled.  Start the patient on insulin 70/30 at 12 units subcutaneous twice daily.  Titrate as tolerated.  Resume metformin at discharge.  4. Malignant hypertension.  Not well controlled.  Increase the dose of amlodipine.  Lisinopril added to patient's regimen to help with blood  pressure in the setting of diabetes.  Continue metoprolol.  When necessary hydralazine and labetalol.  5. Hyperlipidemia.  Likely due to metabolic syndrome from uncontrolled diabetes.  Started the patient on simvastatin.  6. Tobacco abuse.  Encouraged cessation.  When necessary nicotine patch prescribed.  7.  Mild renal insufficiency versus Chronic kidney disease stage II. likely due to type 2 diabetes.  8. Prophylaxis.  Subcutaneous heparin.  9.  Generalized weakness.  PT evaluation pending.  10.  Disposition.  Pending.  PT evaluation is pending.   LOS: 1 day  Kember Boch A, MD 01/30/2011, 5:08 PM

## 2011-01-30 NOTE — Progress Notes (Signed)
BP remains elevated 172/92.  States feels relaxed.  States headache has improved some.  Dr. Reece Levy on unit notified of these results.

## 2011-01-30 NOTE — Progress Notes (Signed)
PT Cancellation Pt defers PT today.  She reports she has been up walking and currently has a headache.  Will defer PT eval thil tomorrow Maudry Diego, PT

## 2011-01-31 ENCOUNTER — Inpatient Hospital Stay (HOSPITAL_COMMUNITY): Payer: Medicaid Other

## 2011-01-31 DIAGNOSIS — I359 Nonrheumatic aortic valve disorder, unspecified: Secondary | ICD-10-CM

## 2011-01-31 LAB — GLUCOSE, CAPILLARY

## 2011-01-31 LAB — CBC
HCT: 31.8 % — ABNORMAL LOW (ref 36.0–46.0)
Hemoglobin: 11.1 g/dL — ABNORMAL LOW (ref 12.0–15.0)
MCV: 84.4 fL (ref 78.0–100.0)
RBC: 3.77 MIL/uL — ABNORMAL LOW (ref 3.87–5.11)
WBC: 6.3 10*3/uL (ref 4.0–10.5)

## 2011-01-31 LAB — CARDIAC PANEL(CRET KIN+CKTOT+MB+TROPI)
Relative Index: INVALID (ref 0.0–2.5)
Total CK: 92 U/L (ref 7–177)
Troponin I: <0.3 ng/mL (ref <0.30)

## 2011-01-31 LAB — BASIC METABOLIC PANEL
BUN: 14 mg/dL (ref 6–23)
CO2: 27 mEq/L (ref 19–32)
Chloride: 99 mEq/L (ref 96–112)
Creatinine, Ser: 1.06 mg/dL (ref 0.50–1.10)

## 2011-01-31 LAB — MAGNESIUM: Magnesium: 1.8 mg/dL (ref 1.5–2.5)

## 2011-01-31 MED ORDER — FENOFIBRATE 160 MG PO TABS
160.0000 mg | ORAL_TABLET | Freq: Every day | ORAL | Status: DC
  Administered 2011-01-31 – 2011-02-01 (×2): 160 mg via ORAL
  Filled 2011-01-31 (×3): qty 1

## 2011-01-31 MED ORDER — PANTOPRAZOLE SODIUM 40 MG PO TBEC
40.0000 mg | DELAYED_RELEASE_TABLET | Freq: Every day | ORAL | Status: DC
  Administered 2011-01-31 – 2011-02-01 (×2): 40 mg via ORAL
  Filled 2011-01-31 (×3): qty 1

## 2011-01-31 MED ORDER — GADOBENATE DIMEGLUMINE 529 MG/ML IV SOLN
20.0000 mL | Freq: Once | INTRAVENOUS | Status: AC | PRN
  Administered 2011-01-31: 20 mL via INTRAVENOUS

## 2011-01-31 MED ORDER — INSULIN ASPART PROT & ASPART (70-30 MIX) 100 UNIT/ML ~~LOC~~ SUSP
17.0000 [IU] | Freq: Two times a day (BID) | SUBCUTANEOUS | Status: DC
  Administered 2011-01-31: 17 [IU] via SUBCUTANEOUS

## 2011-01-31 MED ORDER — LORAZEPAM 2 MG/ML IJ SOLN
1.0000 mg | Freq: Once | INTRAMUSCULAR | Status: AC | PRN
  Administered 2011-01-31: 1 mg via INTRAVENOUS
  Filled 2011-01-31: qty 1

## 2011-01-31 MED ORDER — POTASSIUM CHLORIDE CRYS ER 20 MEQ PO TBCR
40.0000 meq | EXTENDED_RELEASE_TABLET | Freq: Once | ORAL | Status: AC
  Administered 2011-01-31: 40 meq via ORAL
  Filled 2011-01-31: qty 2

## 2011-01-31 NOTE — Progress Notes (Signed)
*  PRELIMINARY RESULTS* Echocardiogram 2D Echocardiogram has been performed.  Pollyann Savoy 01/31/2011, 11:23 AM

## 2011-01-31 NOTE — Progress Notes (Signed)
Subjective: Patient received ativan for MRI and has been sleepy since then.  Reports leg paresthesia improved after being started on gabapentin.  Objective: Vital signs in last 24 hours: Filed Vitals:   01/31/11 0003 01/31/11 0424 01/31/11 1030 01/31/11 1400  BP: 157/85 154/79 153/89 131/79  Pulse: 72 75 76 76  Temp: 98 F (36.7 C) 98.2 F (36.8 C) 98.2 F (36.8 C) 98.3 F (36.8 C)  TempSrc: Oral Oral Oral Oral  Resp: 18 18 18 16   Height:      Weight:  101.742 kg (224 lb 4.8 oz)    SpO2: 96% 97% 99% 96%   Weight change: -0.953 kg (-2 lb 1.6 oz)  Intake/Output Summary (Last 24 hours) at 01/31/11 1511 Last data filed at 01/30/11 1810  Gross per 24 hour  Intake    240 ml  Output      0 ml  Net    240 ml    Physical Exam: General: Sleepy but easily arousable, No acute distress. HEENT: EOMI. Neck: Supple CV: S1 and S2 Lungs: Clear to ascultation bilaterally Abdomen: Soft, Nontender, Nondistended, +bowel sounds. Ext: Good pulses. Trace edema.  Lab Results:  [facility] 01/31/11 0524 01/30/11 0430  NA 135 135  K 3.2* 3.5  CL 99 99  CO2 27 23  GLUCOSE 211* 328*  BUN 14 17  CREATININE 1.06 1.14*  CALCIUM 9.0 9.0  MG 1.8 --  PHOS -- --    Basename 01/29/11 1539  AST 12  ALT 12  ALKPHOS 55  BILITOT 0.2*  PROT 6.4  ALBUMIN 2.6*   No results found for this basename: LIPASE:2,AMYLASE:2 in the last 72 hours  Basename 01/31/11 0524 01/30/11 0430 01/29/11 1539  WBC 6.3 8.2 --  NEUTROABS -- -- 5.2  HGB 11.1* 10.4* --  HCT 31.8* 30.3* --  MCV 84.4 84.2 --  PLT 417* 410* --    Basename 01/31/11 0524 01/29/11 1539  CKTOTAL 92 163  CKMB 2.9 3.7  CKMBINDEX -- --  TROPONINI <0.30 <0.30   No components found with this basename: POCBNP:3 No results found for this basename: DDIMER:2 in the last 72 hours  Basename 01/29/11 1539  HGBA1C 10.2*    Basename 01/30/11 0430  CHOL 418*  HDL 41  LDLCALC UNABLE TO CALCULATE IF TRIGLYCERIDE OVER 400 mg/dL  TRIG 1046*    CHOLHDL 10.2  LDLDIRECT --    Basename 01/30/11 0430  TSH 1.791  T4TOTAL --  T3FREE --  THYROIDAB --   No results found for this basename: VITAMINB12:2,FOLATE:2,FERRITIN:2,TIBC:2,IRON:2,RETICCTPCT:2 in the last 72 hours  Micro Results: No results found for this or any previous visit (from the past 240 hour(s)).  Studies/Results: Dg Chest 2 View  01/29/2011  *RADIOLOGY REPORT*  Clinical Data: Chest pain  CHEST - 2 VIEW  Comparison: 01/13/2011  Findings: The heart is moderately enlarged.  Lungs are clear.  No pneumothorax and no pleural effusion.  Pulmonary vascularity is within normal limits.  IMPRESSION: Cardiomegaly without decompensation.  Original Report Authenticated By: Jamas Lav, M.D.   Ct Head Wo Contrast  01/29/2011  *RADIOLOGY REPORT*  Clinical Data: Headaches.  Lower extremity weakness.  CT HEAD WITHOUT CONTRAST 01/29/2011:  Technique:  Contiguous axial images were obtained from the base of the skull through the vertex without contrast.  Comparison: None.  Findings: Head tilt in the gantry accounts for apparent asymmetry in the cerebral hemispheres. Ventricular system normal in size and appearance for age.  No mass lesion.  No midline shift.  No  acute hemorrhage or hematoma.  No extra-axial fluid collections.  No evidence of acute infarction.  No focal brain parenchymal abnormalities.  No focal osseous abnormalities involving the skull.  Visualized paranasal sinuses, mastoid air cells, and middle ear cavities well- aerated.  Bilateral carotid siphon atherosclerosis, left greater than right.  IMPRESSION:  1.  No acute intracranial abnormality. 2.  Carotid siphon atherosclerosis, left greater than right.  Original Report Authenticated By: Deniece Portela, M.D.   Mr Jeri Cos Contrast  01/31/2011  *RADIOLOGY REPORT*  Clinical Data:  Right-sided weakness.  MRI HEAD WITHOUT AND WITH CONTRAST  Technique:  Multiplanar, multiecho pulse sequences of the brain and surrounding  structures were obtained without and with intravenous contrast.  Contrast: 19mL MULTIHANCE GADOBENATE DIMEGLUMINE 529 MG/ML IV SOLN  Comparison:  CT head without contrast 01/29/2011.  Findings:  No acute infarct, hemorrhage, mass lesion is present. Multiple periventricular subcortical T2 and FLAIR hyperintensities are present bilaterally.  There is some involvement of the callosal septal margin bilaterally with the appearance of the Dawson's fingers on both sides.  The postcontrast images demonstrate no pathologic enhancement of these lesions.  The there is no restricted diffusion.  The sella is mildly enlarged without enlargement of the pituitary.  Flow is present in the major intracranial arteries.  The globes orbits are intact.  Minimal mucosal thickening scattered throughout the ethmoid air cells and maxillary sinuses.  There is mild disease of frontal and sphenoid sinuses as well.  The mastoid air cells are clear.  IMPRESSION:  1.  Prominent periventricular subcortical white matter disease bilaterally. The finding is nonspecific but can be seen in the setting of chronic microvascular ischemia, a demyelinating process such as multiple sclerosis, vasculitis, complicated migraine headaches, or as the sequelae of a prior infectious or inflammatory process.  The pattern and age demographic favors a demyelinating process such as multiple sclerosis. 2.  Minimal sinus disease.  Original Report Authenticated By: Resa Miner. MATTERN, M.D.   Dg Abd Portable 1v  01/30/2011  *RADIOLOGY REPORT*  Clinical Data: Nausea and vomiting  ABDOMEN - 1 VIEW  Comparison: CT abdomen pelvis 08/18/2010  Findings: The bowel gas pattern is nonobstructive. 7.8 cm rounded calcified mass projecting over L5-S1 corresponds with the calcified uterine fibroid seen on prior CT.  No acute bony abnormality.  IMPRESSION: Nonobstructive bowel gas pattern.  Original Report Authenticated By: Curlene Dolphin, M.D.    Medications: I have reviewed  the patient's current medications. Scheduled Meds:    . amLODipine  10 mg Oral Daily  . aspirin EC  81 mg Oral Q24H  . gabapentin  300 mg Oral BID   Followed by  . gabapentin  300 mg Oral TID  . heparin  5,000 Units Subcutaneous Q8H  . insulin aspart  0-9 Units Subcutaneous TID WC  . insulin aspart protamine-insulin aspart  17 Units Subcutaneous BID WC  . lisinopril  10 mg Oral Daily  . metoprolol tartrate  50 mg Oral BID  . potassium chloride  40 mEq Oral Once  . DISCONTD: insulin aspart protamine-insulin aspart  12 Units Subcutaneous BID WC   Continuous Infusions:    . sodium chloride     PRN Meds:.albuterol, gadobenate dimeglumine, guaiFENesin-dextromethorphan, hydrALAZINE, HYDROcodone-acetaminophen, labetalol, LORazepam, LORazepam, nicotine, ondansetron (ZOFRAN) IV, ondansetron  Assessment/Plan: 1. Right leg weakness and paresthesia.  Likely due to diabetic neuropathy.  MRI of the brain done with results as indicated above.  Spoke to Dr. Paulino Rily, neurology, who thought given patient's risk factors of malignant hypertension,  uncontrolled diabetes, uncontrolled hyperlipidemia findings on MRI were suggestive of microvascular ischemic changes rather than due to multiple sclerosis.  Recommended the patient have outpatient nerve conduction studies.  Patient indicates improvement on gabapentin.  Continue physical therapy.  The results of the MRI were discussed with the patient and family.   2. Diabetic neuropathy.  Continue to titrate gabapentin.  Reports improvement.  3. Type II diabetes mellitus with complication, uncontrolled.  Hemoglobin A1c 10.2.  Still not well controlled.  Increase insulin 70/30 at 12 to 17 units subcutaneous twice daily.  Titrate as tolerated.  Resume metformin at discharge.  4. Malignant hypertension.  Better controlled today.  Continue amlodipine, Lisinopril, and metoprolol.  When necessary hydralazine and labetalol.  5. Mixed Hyperlipidemia.  Likely due to  metabolic syndrome from uncontrolled diabetes.  Started the patient on simvastatin.  Likely will need reevaluation in a few months.  Start the patient on fenofibrate.  6. Tobacco abuse.  Encouraged cessation.  When necessary nicotine patch prescribed.  7.  Mild renal insufficiency versus Chronic kidney disease stage II likely due to type 2 diabetes.  8.  Chest pain.  Still complaining of burning pain in her chest.  Start PPI.  2-D echocardiogram pending.  9. Prophylaxis.  Subcutaneous heparin.  10.  Generalized weakness.  PT evaluation pending.  11.  Disposition.  Pending.  Consider possible discharge tomorrow based on PT evaluation and after 2-D echocardiogram.   LOS: 2 days  Rolan Wrightsman A, MD 01/31/2011, 3:11 PM

## 2011-01-31 NOTE — Progress Notes (Signed)
Returned to room. No ADN. Tele replaced.

## 2011-01-31 NOTE — Progress Notes (Signed)
Pt had 8 beats of V tach. BP is 157/85. Pulse is 72. Pt asymptomatic. MD notified. Will continue to monitor pt. Rosie Fate

## 2011-01-31 NOTE — Progress Notes (Signed)
Transported to MRI via bed.

## 2011-01-31 NOTE — Progress Notes (Signed)
Physical Therapy Note Partial eval performed.. Limited due to pt requiring medication for scheduled MRI.  Pt demonstrated weakness in right hip flexion with some pain deep in hip. Right knee ankle strength appears WNL  Pt is able to walk without device though she does have some impairment and may benefit from trial with straight cane.  Will complete eval and formal documentation next visit, but initial impression would be for follow up outpt PT Maudry Diego, PT No charge (<8 min)

## 2011-02-01 LAB — GLUCOSE, CAPILLARY: Glucose-Capillary: 257 mg/dL — ABNORMAL HIGH (ref 70–99)

## 2011-02-01 MED ORDER — METOPROLOL TARTRATE 50 MG PO TABS: 50.0000 mg | ORAL_TABLET | Freq: Two times a day (BID) | ORAL | Status: DC

## 2011-02-01 MED ORDER — METFORMIN HCL 1000 MG PO TABS: 1000.0000 mg | ORAL_TABLET | Freq: Two times a day (BID) | ORAL | Status: DC

## 2011-02-01 MED ORDER — AMLODIPINE BESYLATE 10 MG PO TABS: 10.0000 mg | ORAL_TABLET | Freq: Every day | ORAL | Status: DC

## 2011-02-01 MED ORDER — HYDROCODONE-ACETAMINOPHEN 5-325 MG PO TABS: 1.0000 | ORAL_TABLET | Freq: Four times a day (QID) | ORAL | Status: AC | PRN

## 2011-02-01 MED ORDER — NICOTINE 14 MG/24HR TD PT24: 1.0000 | MEDICATED_PATCH | Freq: Every day | TRANSDERMAL | Status: AC | PRN

## 2011-02-01 MED ORDER — LIVING WELL WITH DIABETES BOOK
Freq: Once | Status: DC
  Filled 2011-02-01: qty 1

## 2011-02-01 MED ORDER — UNABLE TO FIND: Status: DC

## 2011-02-01 MED ORDER — INSULIN ASPART PROT & ASPART (70-30 MIX) 100 UNIT/ML ~~LOC~~ SUSP: 25.0000 [IU] | Freq: Two times a day (BID) | SUBCUTANEOUS | Status: DC

## 2011-02-01 MED ORDER — GABAPENTIN 300 MG PO CAPS: 300.0000 mg | ORAL_CAPSULE | Freq: Three times a day (TID) | ORAL | Status: DC

## 2011-02-01 MED ORDER — FENOFIBRATE 160 MG PO TABS: 160.0000 mg | ORAL_TABLET | Freq: Every day | ORAL | Status: DC

## 2011-02-01 MED ORDER — OMEPRAZOLE MAGNESIUM 20 MG PO TBEC: 20.0000 mg | DELAYED_RELEASE_TABLET | Freq: Every day | ORAL | Status: DC

## 2011-02-01 MED ORDER — INSULIN ASPART PROT & ASPART (70-30 MIX) 100 UNIT/ML ~~LOC~~ SUSP
20.0000 [IU] | Freq: Two times a day (BID) | SUBCUTANEOUS | Status: DC
  Administered 2011-02-01: 20 [IU] via SUBCUTANEOUS

## 2011-02-01 MED ORDER — INSULIN PEN STARTER KIT
1.0000 | Freq: Once | Status: DC
  Filled 2011-02-01: qty 1

## 2011-02-01 MED ORDER — LISINOPRIL 10 MG PO TABS: 10.0000 mg | ORAL_TABLET | Freq: Every day | ORAL | Status: DC

## 2011-02-01 NOTE — Progress Notes (Signed)
Subjective: Chest pain resolved after being started on PPI. Feeling better today.  Objective: Vital signs in last 24 hours: Filed Vitals:   02/01/11 0500 02/01/11 0515 02/01/11 1000 02/01/11 1349  BP:  167/97 171/91 147/80  Pulse:  74 83 81  Temp:  97.7 F (36.5 C)  98.6 F (37 C)  TempSrc:  Oral  Oral  Resp:  18 18 18   Height:      Weight: 103.874 kg (229 lb)     SpO2:  99% 98% 98%   Weight change: 2.132 kg (4 lb 11.2 oz)  Intake/Output Summary (Last 24 hours) at 02/01/11 1514 Last data filed at 02/01/11 1300  Gross per 24 hour  Intake    420 ml  Output    400 ml  Net     20 ml    Physical Exam: General: Awake, No acute distress. HEENT: EOMI. Neck: Supple CV: S1 and S2 Lungs: Clear to ascultation bilaterally Abdomen: Soft, Nontender, Nondistended, +bowel sounds. Ext: Good pulses. Trace edema.  Lab Results:  [facility] 01/31/11 0524 01/30/11 0430  NA 135 135  K 3.2* 3.5  CL 99 99  CO2 27 23  GLUCOSE 211* 328*  BUN 14 17  CREATININE 1.06 1.14*  CALCIUM 9.0 9.0  MG 1.8 --  PHOS -- --    Basename 01/29/11 1539  AST 12  ALT 12  ALKPHOS 55  BILITOT 0.2*  PROT 6.4  ALBUMIN 2.6*   No results found for this basename: LIPASE:2,AMYLASE:2 in the last 72 hours  Basename 01/31/11 0524 01/30/11 0430 01/29/11 1539  WBC 6.3 8.2 --  NEUTROABS -- -- 5.2  HGB 11.1* 10.4* --  HCT 31.8* 30.3* --  MCV 84.4 84.2 --  PLT 417* 410* --    Basename 01/31/11 0524 01/29/11 1539  CKTOTAL 92 163  CKMB 2.9 3.7  CKMBINDEX -- --  TROPONINI <0.30 <0.30   No components found with this basename: POCBNP:3 No results found for this basename: DDIMER:2 in the last 72 hours  Basename 01/29/11 1539  HGBA1C 10.2*    Basename 01/30/11 0430  CHOL 418*  HDL 41  LDLCALC UNABLE TO CALCULATE IF TRIGLYCERIDE OVER 400 mg/dL  TRIG 1046*  CHOLHDL 10.2  LDLDIRECT --    Basename 01/30/11 0430  TSH 1.791  T4TOTAL --  T3FREE --  THYROIDAB --   No results found for this  basename: VITAMINB12:2,FOLATE:2,FERRITIN:2,TIBC:2,IRON:2,RETICCTPCT:2 in the last 72 hours  Micro Results: No results found for this or any previous visit (from the past 240 hour(s)).  Studies/Results: Mr Jeri Cos Contrast  01/31/2011  *RADIOLOGY REPORT*  Clinical Data:  Right-sided weakness.  MRI HEAD WITHOUT AND WITH CONTRAST  Technique:  Multiplanar, multiecho pulse sequences of the brain and surrounding structures were obtained without and with intravenous contrast.  Contrast: 5mL MULTIHANCE GADOBENATE DIMEGLUMINE 529 MG/ML IV SOLN  Comparison:  CT head without contrast 01/29/2011.  Findings:  No acute infarct, hemorrhage, mass lesion is present. Multiple periventricular subcortical T2 and FLAIR hyperintensities are present bilaterally.  There is some involvement of the callosal septal margin bilaterally with the appearance of the Dawson's fingers on both sides.  The postcontrast images demonstrate no pathologic enhancement of these lesions.  The there is no restricted diffusion.  The sella is mildly enlarged without enlargement of the pituitary.  Flow is present in the major intracranial arteries.  The globes orbits are intact.  Minimal mucosal thickening scattered throughout the ethmoid air cells and maxillary sinuses.  There is mild disease of  frontal and sphenoid sinuses as well.  The mastoid air cells are clear.  IMPRESSION:  1.  Prominent periventricular subcortical white matter disease bilaterally. The finding is nonspecific but can be seen in the setting of chronic microvascular ischemia, a demyelinating process such as multiple sclerosis, vasculitis, complicated migraine headaches, or as the sequelae of a prior infectious or inflammatory process.  The pattern and age demographic favors a demyelinating process such as multiple sclerosis. 2.  Minimal sinus disease.  Original Report Authenticated By: Resa Miner. MATTERN, M.D.    Medications: I have reviewed the patient's current  medications. Scheduled Meds:    . amLODipine  10 mg Oral Daily  . aspirin EC  81 mg Oral Q24H  . fenofibrate  160 mg Oral Daily  . Flexpen Starter Kit  1 kit Other Once  . gabapentin  300 mg Oral BID   Followed by  . gabapentin  300 mg Oral TID  . heparin  5,000 Units Subcutaneous Q8H  . insulin aspart  0-9 Units Subcutaneous TID WC  . insulin aspart protamine-insulin aspart  20 Units Subcutaneous BID WC  . lisinopril  10 mg Oral Daily  . living well with diabetes book   Does not apply Once  . metoprolol tartrate  50 mg Oral BID  . pantoprazole  40 mg Oral Q1200  . DISCONTD: insulin aspart protamine-insulin aspart  17 Units Subcutaneous BID WC   Continuous Infusions:   PRN Meds:.albuterol, guaiFENesin-dextromethorphan, hydrALAZINE, HYDROcodone-acetaminophen, labetalol, nicotine, ondansetron (ZOFRAN) IV, ondansetron  2D ECHO on 01/31/2011 Study Conclusions - Left ventricle: The cavity size was normal. Wall thickness was increased in a pattern of severe LVH. Systolic function was normal. The estimated ejection fraction was 55%. Wall motion was normal; there were no regional wall motion abnormalities. Features are consistent with a pseudonormal left ventricular filling pattern, with concomitant abnormal relaxation and increased filling pressure (grade 2 diastolic dysfunction). - Aortic valve: There was no stenosis. Mild regurgitation. - Mitral valve: Mild regurgitation. - Left atrium: The atrium was mildly dilated. - Right ventricle: The cavity size was normal. Systolic function was normal. - Pulmonary arteries: No complete TR doppler jet so unable to estimate PA systolic pressure. - Systemic veins: IVC measured 2.3 cm with normal respirophasic variation, suggesting RA pressure 6-10 mmHg. Impressions: - Normal LV size with severe LV hypertrophy. EF 55%. Moderate diastolic dysfunction. Normal RV size and systolic function. Mild MR and mild AI.    Assessment/Plan: 1.  Right leg weakness and paresthesia.  Likely due to diabetic neuropathy.  MRI of the brain done with results as indicated above.  Spoke to Dr. Paulino Rily, neurology, yesterday who thought given patient's risk factors of malignant hypertension, uncontrolled diabetes, uncontrolled hyperlipidemia findings on MRI were suggestive of microvascular ischemic changes rather than due to multiple sclerosis.  Recommended the patient have outpatient nerve conduction studies, will give the patient telephone number for [facility] to arrange for followup.  Patient indicates improvement on gabapentin.  Continue physical therapy.    2. Diabetic neuropathy.  Continue to titrate gabapentin.  Reports improvement.  3. Type II diabetes mellitus with complication, uncontrolled.  Hemoglobin A1c 10.2.  Still not well controlled.  Increase insulin 70/30 at 17 to 20 units subcutaneous twice daily.  Titrate as tolerated.  Resume metformin at discharge.  4. Malignant hypertension.  Better controlled today.  Continue amlodipine, Lisinopril, and metoprolol.  5. Mixed Hyperlipidemia.  Likely due to metabolic syndrome from uncontrolled diabetes.  Started the patient on simvastatin and fenofibrate  during the course of the hospital stay.  Likely will need reevaluation in a few months.  6. Tobacco abuse.  Encouraged cessation.  When necessary nicotine patch prescribed.  7.  Mild renal insufficiency versus Chronic kidney disease stage II likely due to type 2 diabetes.  8.  Chest pain.  Likely due to GERD, improved on PPI.  2-D echocardiogram as above.  9. Prophylaxis.  Subcutaneous heparin.  10.  Generalized weakness.  PT recommending cane and outpatient PT.  11.  Disposition.  Pending.  Discharge home today.   LOS: 3 days  Kealey Kemmer A, MD 02/01/2011, 3:14 PM

## 2011-02-01 NOTE — Progress Notes (Signed)
Patient d/c home, alert and oriented, no distress noted. PIV removed no s/s of infiltration , no swelling noted.  Able to self  administer Insulin without difficulty, health teaching done, seen by Diabetes RN. All home prescription discussed with patient , verbalized understanding.

## 2011-02-01 NOTE — Progress Notes (Signed)
Physical Therapy Evaluation Patient Details Name: [name] MRN: [id number] DOB: Feb 11, 1965 Today's Date: 02/01/2011 8:50-9:10 EVI  Recommend Straight Cane for ambulation. Also recommend follow up PT at Outpatient PT. Pt knows to get a prescription from MD who will be following her at home, and has a handout with phone numbers to call for appointment  Problem List:  Patient Active Problem List  Diagnoses  . Hypertension  . Type II diabetes mellitus with complication, uncontrolled  . Right leg weakness  . Nausea & vomiting  . Lactic acidosis  . ARF (acute renal failure)  . Hyperlipidemia, mixed    Past Medical History:  Past Medical History  Diagnosis Date  . Diabetes mellitus   . Hypertension   . Pericardial effusion   . Headache   . Arthritis   . Pneumonia   . Anemia    Past Surgical History:  Past Surgical History  Procedure Date  . Cardiac surgery   . Tubal ligation     PT Assessment/Plan/Recommendation PT Assessment Clinical Impression Statement: pt reports that she feels better walking with cane.  She acknowledges she will do the exercise and is interested in going to outpatient PT.  Pt given sheet of phone numbers for  outpatient clinics and was informed that she will need a prescription from her familiy doctor, not hospitalist and that she can callffor her own appt. PT Recommendation/Assessment: Patient will need skilled PT in the acute care venue PT Problem List: Decreased strength Barriers to Discharge: None PT Therapy Diagnosis : Difficulty walking;Abnormality of gait PT Plan PT Frequency: Min 3X/week PT Treatment/Interventions: DME instruction;Gait training;Therapeutic exercise PT Recommendation Follow Up Recommendations: Outpatient PT Equipment Recommended:  (straight cane) PT Goals  Acute Rehab PT Goals PT Goal Formulation: With patient Time For Goal Achievement: 2 weeks Pt will Perform Home Exercise Program: Independently PT Goal: Perform  Home Exercise Program - Progress: Not met  PT Evaluation Precautions/Restrictions    Prior Functioning      Cognition Cognition Orientation Level: Oriented X4 Sensation/Coordination Sensation Additional Comments: pt states right leg feels better since starting the medicing Coordination Heel Shin Test: pt has diffuculty with right heel to shin because she is unable to lift right leg up Extremity Assessment RLE Assessment RLE Assessment: Exceptions to [facility] RLE AROM (degrees) RLE Overall AROM Comments: pt has WNL PROM and focal weakness in right hip flexion. she has active motion in all other groups and not evidence of clonus or babinski reflex LLE Assessment LLE Assessment: Within Functional Limits Mobility (including Balance) Bed Mobility Bed Mobility: Yes Rolling Right: 7: Independent Supine to Sit: 7: Independent Transfers Transfers: Yes Sit to Stand: 7: Independent Stand to Sit: 7: Independent Ambulation/Gait Ambulation/Gait: Yes Ambulation/Gait Assistance: 6: Modified independent (Device/Increase time) Ambulation Distance (Feet): 200 Feet Assistive device: Straight cane (pt carries cane in left hand to increase base of support ) Gait Pattern: Step-through pattern Gait velocity: without cane, gait is slowed, with antalgic pattern, with decreased weight bearing on right leg.  It is improved with use of cane in left hand to a more normal reciprocal arm and leg pattern Stairs: Yes Stairs Assistance: 6: Modified independent (Device/Increase time) Stair Management Technique: One rail Right;Forwards;With cane Number of Stairs: 5  Wheelchair Mobility Wheelchair Mobility: No    Exercise  Other Exercises Other Exercises: pt issued handout for general exercise including chair squats, heel raises, glute sets, ankle ROM and active standing knee raises to exercise hip flexion and core End of Session PT - End  of Session Equipment Utilized During Treatment:  (straight  cane) Activity Tolerance: Patient tolerated treatment well Patient left: in bed Nurse Communication: Other (comment) (need for straight cane for home) General Behavior During Session: [facility] for tasks performed Cognition: [facility] for tasks performed  Norwood Levo 02/01/2011, 11:52 AM

## 2011-02-01 NOTE — Discharge Summary (Signed)
Discharge Summary  [name] MR#: [id number]  DOB:01/18/1966  Date of Admission: 01/29/2011 Date of Discharge: 02/01/2011  [name].  Attending Physician:Kaysha Parsell A  Consults: None  Discharge Diagnoses: Principal Problem:  *Right leg weakness Active Problems:  Hypertension  Type II diabetes mellitus with complication, uncontrolled  Nausea & vomiting  Lactic acidosis  ARF (acute renal failure)  Hyperlipidemia, mixed  Brief Admitting History and Physical 45 year old [place] American female history of type 2 diabetes, hypertension and tobacco abuse who presented on 01/29/2011 with right lower extremity weakness.  Discharge Medications Current Discharge Medication List    START taking these medications   Details  amLODipine (NORVASC) 10 MG tablet Take 1 tablet (10 mg total) by mouth daily. Qty: 30 tablet, Refills: 0    fenofibrate 160 MG tablet Take 1 tablet (160 mg total) by mouth daily. Qty: 30 tablet, Refills: 0    gabapentin (NEURONTIN) 300 MG capsule Take 1 capsule (300 mg total) by mouth 3 (three) times daily. Qty: 90 capsule, Refills: 0    HYDROcodone-acetaminophen (NORCO) 5-325 MG per tablet Take 1 tablet by mouth every 6 (six) hours as needed. Qty: 15 tablet, Refills: 0    insulin aspart protamine-insulin aspart (NOVOLOG 70/30) (70-30) 100 UNIT/ML injection Inject 25 Units into the skin 2 (two) times daily with a meal. Qty: 40 mL, Refills: 0    lisinopril (PRINIVIL,ZESTRIL) 10 MG tablet Take 1 tablet (10 mg total) by mouth daily. Qty: 30 tablet, Refills: 0    metoprolol (LOPRESSOR) 50 MG tablet Take 1 tablet (50 mg total) by mouth 2 (two) times daily. Qty: 60 tablet, Refills: 0    nicotine (NICODERM CQ - DOSED IN MG/24 HOURS) 14 mg/24hr patch Place 1 patch onto the skin daily as needed (smoking). Qty: 28 patch, Refills: 0    omeprazole (PRILOSEC OTC) 20 MG tablet Take 1 tablet (20 mg total) by mouth  daily. Qty: 30 tablet, Refills: 0    oxyCODONE-acetaminophen (PERCOCET) 5-325 MG per tablet Take 2 tablets by mouth every 4 (four) hours as needed for pain. Qty: 10 tablet, Refills: 0    !! UNABLE TO FIND Syringe and needles for insulin usage. Qty: 60 Units, Refills: 0    !! UNABLE TO FIND Outpatient physical therapy for right leg weakness, likely due to diabetic neuropathy. Qty: 1 Units, Refills: 0     !! - Potential duplicate medications found. Please discuss with provider.    CONTINUE these medications which have CHANGED   Details  metFORMIN (GLUCOPHAGE) 1000 MG tablet Take 1 tablet (1,000 mg total) by mouth 2 (two) times daily with a meal. Qty: 180 tablet, Refills: 0      CONTINUE these medications which have NOT CHANGED   Details  aspirin EC 81 MG tablet Take 81 mg by mouth every morning.     CINNAMON PO Take 2 tablets by mouth every morning.        STOP taking these medications     lisinopril-hydrochlorothiazide (PRINZIDE,ZESTORETIC) 20-25 MG per tablet         Hospital Course: 1. Right leg weakness and paresthesia.  Likely due to diabetic neuropathy.  MRI of the brain done with results as indicated below.  Spoke to Dr. Paulino Rily, neurology, who thought given patient's risk factors of malignant hypertension, uncontrolled diabetes, uncontrolled hyperlipidemia findings on MRI were suggestive of microvascular ischemic changes rather than due to multiple sclerosis.  Recommended the patient have outpatient nerve conduction studies, will give the patient telephone number  for [facility] to arrange for followup.  Patient indicates improvement on gabapentin which was started given strong suspicion for diabetic neuropathy.  Physical therapy evaluated the patient and recommended outpatient physical therapy which will be arranged for the patient at discharge.    2. Diabetic neuropathy.  Continue to titrate gabapentin.  Reports improvement.  3. Type II diabetes mellitus with  complication, uncontrolled.  Hemoglobin A1c 10.2.  Started the patient on insulin 70/30 titrated to 25 units subcutaneous twice daily.  Further titration to be done as outpatient under care of her primary care physician. Resume metformin at discharge.  Will arrange for outpatient diabetic education and followup.  4. Malignant hypertension.  Not well controlled initially, started the patient on amlodipine, Lisinopril, and metoprolol and titrated dosage until blood pressure was better controlled.  Titration of antihypertensive medications to be done as outpatient.  5. Mixed Hyperlipidemia.  Likely due to metabolic syndrome from uncontrolled diabetes.  Started the patient on simvastatin and fenofibrate during the course of the hospital stay.  Likely will need reevaluation in a few months.  6. Tobacco abuse.  Encouraged cessation.  When necessary nicotine patch prescribed.  7.  Mild renal insufficiency versus Chronic kidney disease stage II likely due to type 2 diabetes.  8.  Chest pain.  Likely due to GERD, improved on PPI, omeprazole prescribed at discharge.  2-D echocardiogram done with results as indicated below.  Day of Discharge BP 147/80  Pulse 81  Temp(Src) 98.6 F (37 C) (Oral)  Resp 18  Ht 5\' 9"  (1.753 m)  Wt 103.874 kg (229 lb)  BMI 33.82 kg/m2  SpO2 98%  Results for orders placed during the hospital encounter of 01/29/11 (from the past 48 hour(s))  GLUCOSE, CAPILLARY     Status: Abnormal   Collection Time   01/30/11  4:33 PM      Component Value Range Comment   Glucose-Capillary 241 (*) 70 - 99 (mg/dL)   GLUCOSE, CAPILLARY     Status: Abnormal   Collection Time   01/30/11  9:54 PM      Component Value Range Comment   Glucose-Capillary 210 (*) 70 - 99 (mg/dL)    Comment 1 Documented in Chart      Comment 2 Notify RN     CBC     Status: Abnormal   Collection Time   01/31/11  5:24 AM      Component Value Range Comment   WBC 6.3  4.0 - 10.5 (K/uL)    RBC 3.77 (*) 3.87 -  5.11 (MIL/uL)    Hemoglobin 11.1 (*) 12.0 - 15.0 (g/dL)    HCT 31.8 (*) 36.0 - 46.0 (%)    MCV 84.4  78.0 - 100.0 (fL)    MCH 29.4  26.0 - 34.0 (pg)    MCHC 34.9  30.0 - 36.0 (g/dL)    RDW 13.7  11.5 - 15.5 (%)    Platelets 417 (*) 150 - 400 (K/uL)   BASIC METABOLIC PANEL     Status: Abnormal   Collection Time   01/31/11  5:24 AM      Component Value Range Comment   Sodium 135  135 - 145 (mEq/L)    Potassium 3.2 (*) 3.5 - 5.1 (mEq/L)    Chloride 99  96 - 112 (mEq/L)    CO2 27  19 - 32 (mEq/L)    Glucose, Bld 211 (*) 70 - 99 (mg/dL)    BUN 14  6 - 23 (mg/dL)  Creatinine, Ser 1.06  0.50 - 1.10 (mg/dL)    Calcium 9.0  8.4 - 10.5 (mg/dL)    GFR calc non Af Amer 62 (*) >90 (mL/min)    GFR calc Af Amer 72 (*) >90 (mL/min)   CARDIAC PANEL(CRET KIN+CKTOT+MB+TROPI)     Status: Normal   Collection Time   01/31/11  5:24 AM      Component Value Range Comment   Total CK 92  7 - 177 (U/L)    CK, MB 2.9  0.3 - 4.0 (ng/mL)    Troponin I <0.30  <0.30 (ng/mL)    Relative Index RELATIVE INDEX IS INVALID  0.0 - 2.5    MAGNESIUM     Status: Normal   Collection Time   01/31/11  5:24 AM      Component Value Range Comment   Magnesium 1.8  1.5 - 2.5 (mg/dL)   GLUCOSE, CAPILLARY     Status: Abnormal   Collection Time   01/31/11  7:35 AM      Component Value Range Comment   Glucose-Capillary 241 (*) 70 - 99 (mg/dL)   GLUCOSE, CAPILLARY     Status: Abnormal   Collection Time   01/31/11 11:40 AM      Component Value Range Comment   Glucose-Capillary 226 (*) 70 - 99 (mg/dL)   GLUCOSE, CAPILLARY     Status: Abnormal   Collection Time   01/31/11  4:47 PM      Component Value Range Comment   Glucose-Capillary 264 (*) 70 - 99 (mg/dL)   GLUCOSE, CAPILLARY     Status: Abnormal   Collection Time   01/31/11  9:39 PM      Component Value Range Comment   Glucose-Capillary 219 (*) 70 - 99 (mg/dL)    Comment 1 Documented in Chart      Comment 2 Notify RN     GLUCOSE, CAPILLARY     Status: Abnormal     Collection Time   02/01/11  7:32 AM      Component Value Range Comment   Glucose-Capillary 257 (*) 70 - 99 (mg/dL)   GLUCOSE, CAPILLARY     Status: Abnormal   Collection Time   02/01/11 11:43 AM      Component Value Range Comment   Glucose-Capillary 226 (*) 70 - 99 (mg/dL)     Dg Chest 2 View  01/29/2011  *RADIOLOGY REPORT*  Clinical Data: Chest pain  CHEST - 2 VIEW  Comparison: 01/13/2011  Findings: The heart is moderately enlarged.  Lungs are clear.  No pneumothorax and no pleural effusion.  Pulmonary vascularity is within normal limits.  IMPRESSION: Cardiomegaly without decompensation.  Original Report Authenticated By: Jamas Lav, M.D.   Dg Chest 2 View  01/13/2011  *RADIOLOGY REPORT*  Clinical Data: Chest pain  CHEST - 2 VIEW  Comparison: 02/13/2008  Findings: Borderline cardiomegaly again noted.  No acute infiltrate pleural effusion.  No pulmonary edema.  Bony thorax is stable.  IMPRESSION: No active disease.  No significant change.  Original Report Authenticated By: Lahoma Crocker, M.D.   Ct Head Wo Contrast  01/29/2011  *RADIOLOGY REPORT*  Clinical Data: Headaches.  Lower extremity weakness.  CT HEAD WITHOUT CONTRAST 01/29/2011:  Technique:  Contiguous axial images were obtained from the base of the skull through the vertex without contrast.  Comparison: None.  Findings: Head tilt in the gantry accounts for apparent asymmetry in the cerebral hemispheres. Ventricular system normal in size and appearance for age.  No mass  lesion.  No midline shift.  No acute hemorrhage or hematoma.  No extra-axial fluid collections.  No evidence of acute infarction.  No focal brain parenchymal abnormalities.  No focal osseous abnormalities involving the skull.  Visualized paranasal sinuses, mastoid air cells, and middle ear cavities well- aerated.  Bilateral carotid siphon atherosclerosis, left greater than right.  IMPRESSION:  1.  No acute intracranial abnormality. 2.  Carotid siphon atherosclerosis,  left greater than right.  Original Report Authenticated By: Deniece Portela, M.D.   Mr Jeri Cos Contrast  01/31/2011  *RADIOLOGY REPORT*  Clinical Data:  Right-sided weakness.  MRI HEAD WITHOUT AND WITH CONTRAST  Technique:  Multiplanar, multiecho pulse sequences of the brain and surrounding structures were obtained without and with intravenous contrast.  Contrast: 2mL MULTIHANCE GADOBENATE DIMEGLUMINE 529 MG/ML IV SOLN  Comparison:  CT head without contrast 01/29/2011.  Findings:  No acute infarct, hemorrhage, mass lesion is present. Multiple periventricular subcortical T2 and FLAIR hyperintensities are present bilaterally.  There is some involvement of the callosal septal margin bilaterally with the appearance of the Dawson's fingers on both sides.  The postcontrast images demonstrate no pathologic enhancement of these lesions.  The there is no restricted diffusion.  The sella is mildly enlarged without enlargement of the pituitary.  Flow is present in the major intracranial arteries.  The globes orbits are intact.  Minimal mucosal thickening scattered throughout the ethmoid air cells and maxillary sinuses.  There is mild disease of frontal and sphenoid sinuses as well.  The mastoid air cells are clear.  IMPRESSION:  1.  Prominent periventricular subcortical white matter disease bilaterally. The finding is nonspecific but can be seen in the setting of chronic microvascular ischemia, a demyelinating process such as multiple sclerosis, vasculitis, complicated migraine headaches, or as the sequelae of a prior infectious or inflammatory process.  The pattern and age demographic favors a demyelinating process such as multiple sclerosis. 2.  Minimal sinus disease.  Original Report Authenticated By: Resa Miner. MATTERN, M.D.   Dg Abd Portable 1v  01/30/2011  *RADIOLOGY REPORT*  Clinical Data: Nausea and vomiting  ABDOMEN - 1 VIEW  Comparison: CT abdomen pelvis 08/18/2010  Findings: The bowel gas pattern is  nonobstructive. 7.8 cm rounded calcified mass projecting over L5-S1 corresponds with the calcified uterine fibroid seen on prior CT.  No acute bony abnormality.  IMPRESSION: Nonobstructive bowel gas pattern.  Original Report Authenticated By: Curlene Dolphin, M.D.    2D ECHO on 01/31/2011 Study Conclusions - Left ventricle: The cavity size was normal. Wall thickness was increased in a pattern of severe LVH. Systolic function was normal. The estimated ejection fraction was 55%. Wall motion was normal; there were no regional wall motion abnormalities. Features are consistent with a pseudonormal left ventricular filling pattern, with concomitant abnormal relaxation and increased filling pressure (grade 2 diastolic dysfunction). - Aortic valve: There was no stenosis. Mild regurgitation. - Mitral valve: Mild regurgitation. - Left atrium: The atrium was mildly dilated. - Right ventricle: The cavity size was normal. Systolic function was normal. - Pulmonary arteries: No complete TR doppler jet so unable to estimate PA systolic pressure. - Systemic veins: IVC measured 2.3 cm with normal respirophasic variation, suggesting RA pressure 6-10 mmHg. Impressions: - Normal LV size with severe LV hypertrophy. EF 55%. Moderate diastolic dysfunction. Normal RV size and systolic function. Mild MR and mild AI.  Disposition: Home with outpatient physical therapy.  Diet: Diabetic diet  Activity: Resume as tolerated   Follow-up Appts: Discharge Orders  Future Orders Please Complete By Expires   Diet Carb Modified      Increase activity slowly      Discharge instructions      Comments:   Followup with [facility] for management of diabetes and other medical conditions.  Follow up with [facility] neurology for consideration of EMG nerve conduction studies.      TESTS THAT NEED FOLLOW-UP None  Time spent on discharge, talking to the patient, and coordinating care: 40  mins.   Signed: Bynum Bellows, MD 02/01/2011, 3:31 PM

## 2011-02-01 NOTE — Progress Notes (Signed)
Inpatient Diabetes Program Recommendations  AACE/ADA: New Consensus Statement on Inpatient Glycemic Control (2009)  Target Ranges:  Prepandial:   less than 140 mg/dL      Peak postprandial:   less than 180 mg/dL (1-2 hours)      Critically ill patients:  140 - 180 mg/dL   Reason for Visit: Hyperglycemia  Inpatient Diabetes Program Recommendations Insulin - Basal: Increase 70/30 insulin to 25 units bid HgbA1C: 10.2% Discussed with pt importance of controlling blood sugars Outpatient Referral: please order OP Diabetes Education consult for HgbA1C >7.  Note: CBGs 257, 226.  Pt states she will need a PCP to manage her diabetes.  Previously on [place] but states she can't afford expensive med at this time. Pt has hx of GDM and has understands insulin administration.  Demonstrated per RN.  Answered questions and ordered insulin starter kit and Living Well With Diabetes book.  Pt very interested in OP Diabetes Education.

## 2011-02-01 NOTE — Progress Notes (Signed)
OT Screen Order received, chart reviewed. Spoke briefly with patient who stated she was having no difficulty w/ ADLs and would not require any OT f/u or equip at this time. Will sign off.  Bernell List, OTR/L  Pager [phone] 02/01/2011

## 2011-04-02 ENCOUNTER — Encounter (HOSPITAL_COMMUNITY): Payer: Self-pay | Admitting: Emergency Medicine

## 2011-04-02 ENCOUNTER — Emergency Department (INDEPENDENT_AMBULATORY_CARE_PROVIDER_SITE_OTHER)
Admission: EM | Admit: 2011-04-02 | Discharge: 2011-04-02 | Disposition: A | Discharge status: Home Health | Payer: Self-pay | Source: Home / Self Care | Attending: Emergency Medicine | Admitting: Emergency Medicine

## 2011-04-02 DIAGNOSIS — I10 Essential (primary) hypertension: Secondary | ICD-10-CM

## 2011-04-02 DIAGNOSIS — E1165 Type 2 diabetes mellitus with hyperglycemia: Secondary | ICD-10-CM

## 2011-04-02 DIAGNOSIS — Z760 Encounter for issue of repeat prescription: Secondary | ICD-10-CM

## 2011-04-02 LAB — POCT URINALYSIS DIP (DEVICE)
Bilirubin Urine: NEGATIVE
Glucose, UA: 250 mg/dL — AB
Ketones, ur: NEGATIVE mg/dL
Specific Gravity, Urine: 1.02 (ref 1.005–1.030)

## 2011-04-02 LAB — POCT I-STAT, CHEM 8
Creatinine, Ser: 1 mg/dL (ref 0.50–1.10)
Glucose, Bld: 265 mg/dL — ABNORMAL HIGH (ref 70–99)
HCT: 33 % — ABNORMAL LOW (ref 36.0–46.0)

## 2011-04-02 MED ORDER — INSULIN ASPART PROT & ASPART (70-30 MIX) 100 UNIT/ML ~~LOC~~ SUSP: 25.0000 [IU] | Freq: Two times a day (BID) | SUBCUTANEOUS | Status: DC

## 2011-04-02 MED ORDER — LISINOPRIL 10 MG PO TABS: 10.0000 mg | ORAL_TABLET | Freq: Every day | ORAL | Status: DC

## 2011-04-02 MED ORDER — METOPROLOL TARTRATE 50 MG PO TABS: 50.0000 mg | ORAL_TABLET | Freq: Two times a day (BID) | ORAL | Status: DC

## 2011-04-02 MED ORDER — FENOFIBRATE 160 MG PO TABS: 160.0000 mg | ORAL_TABLET | Freq: Every day | ORAL | Status: DC

## 2011-04-02 MED ORDER — AMLODIPINE BESYLATE 10 MG PO TABS: 10.0000 mg | ORAL_TABLET | Freq: Every day | ORAL | Status: DC

## 2011-04-02 MED ORDER — GABAPENTIN 300 MG PO CAPS: 300.0000 mg | ORAL_CAPSULE | Freq: Three times a day (TID) | ORAL | Status: DC

## 2011-04-02 MED ORDER — OMEPRAZOLE MAGNESIUM 20 MG PO TBEC: 20.0000 mg | DELAYED_RELEASE_TABLET | Freq: Every day | ORAL | Status: DC

## 2011-04-02 MED ORDER — UNABLE TO FIND: Status: DC

## 2011-04-02 MED ORDER — METFORMIN HCL 1000 MG PO TABS: 1000.0000 mg | ORAL_TABLET | Freq: Two times a day (BID) | ORAL | Status: DC

## 2011-04-02 MED ORDER — GLUCOSE BLOOD VI STRP: ORAL_STRIP | Status: DC

## 2011-04-02 NOTE — ED Provider Notes (Signed)
History     CSN: [id number]  Arrival date & time 04/02/11  [id number]   First MD Initiated Contact with Patient 04/02/11 [phone]      Chief Complaint  Patient presents with  . Medication Refill    (Consider location/radiation/quality/duration/timing/severity/associated sxs/prior treatment) HPI Comments: Patient here for a refill of "all of her" medications, including blood pressure, metformin, insulin, Neurontin. States she ran out to of all of her blood pressure medications 3 weeks ago. Has not been taking the insulin since she was discharged from the hospital on 01/31/2011. Does not have a glucose meter at home, does not know what her sugars have been running. Does not check her blood pressure at home. Patient reports intermittent, diffuse headaches with blurry vision over the past week, but no focal weakness, dysarthria, facial drooping.  Reports right leg "burning" pain, but states that this is unchanged since December. No leg weakness. Denies any chest pain, abdominal pain, lower extremity edema for the past 3 weeks. No polyuria, polydipsia. Patient is currently without complaints.  The history is provided by the patient. No language interpreter was used.    Past Medical History  Diagnosis Date  . Diabetes mellitus   . Hypertension   . Pericardial effusion   . Headache   . Arthritis   . Pneumonia   . Anemia     Past Surgical History  Procedure Date  . Cardiac surgery   . Tubal ligation     No family history on file.  History  Substance Use Topics  . Smoking status: Current Everyday Smoker -- 3.4 packs/day for 12 years    Types: Cigarettes  . Smokeless tobacco: Not on file  . Alcohol Use: No    OB History    Grav Para Term Preterm Abortions TAB SAB Ect Mult Living                  Review of Systems  Constitutional: Negative for fever and fatigue.  Respiratory: Negative for shortness of breath.   Cardiovascular: Negative for chest pain and palpitations.    Gastrointestinal: Negative for nausea, vomiting and diarrhea.  Skin: Negative for rash.  Neurological: Positive for headaches. Negative for seizures, weakness and light-headedness.    Allergies  Review of patient's allergies indicates no known allergies.  Home Medications   Current Outpatient Rx  Name Route Sig Dispense Refill  . AMLODIPINE BESYLATE 10 MG PO TABS Oral Take 1 tablet (10 mg total) by mouth daily. 30 tablet 0  . ASPIRIN EC 81 MG PO TBEC Oral Take 81 mg by mouth every morning.     Marland Kitchen CINNAMON PO Oral Take 2 tablets by mouth every morning.      . FENOFIBRATE 160 MG PO TABS Oral Take 1 tablet (160 mg total) by mouth daily. 30 tablet 0  . GABAPENTIN 300 MG PO CAPS Oral Take 1 capsule (300 mg total) by mouth 3 (three) times daily. 90 capsule 0  . GLUCOSE BLOOD VI STRP  Check your sugar in the morning before you eat breakfast, and one hour after a meal. 100 each 12  . INSULIN ASPART PROT & ASPART (70-30) 100 UNIT/ML [place] SUSP Subcutaneous Inject 25 Units into the skin 2 (two) times daily with a meal. 40 mL 0  . LISINOPRIL 10 MG PO TABS Oral Take 1 tablet (10 mg total) by mouth daily. 30 tablet 0  . METFORMIN HCL 1000 MG PO TABS Oral Take 1 tablet (1,000 mg total) by mouth 2 (two)  times daily with a meal. 180 tablet 0  . METOPROLOL TARTRATE 50 MG PO TABS Oral Take 1 tablet (50 mg total) by mouth 2 (two) times daily. 60 tablet 0  . OMEPRAZOLE MAGNESIUM 20 MG PO TBEC Oral Take 1 tablet (20 mg total) by mouth daily. 30 tablet 0  . UNABLE TO FIND  Outpatient physical therapy for right leg weakness, likely due to diabetic neuropathy. 1 Units 0  . UNABLE TO FIND  Syringe and needles for insulin usage. 60 Units 0    BP 189/107  Pulse 90  Temp(Src) 97.6 F (36.4 C) (Oral)  Resp 17  SpO2 97%  LMP 02/21/2011   Physical Exam  Nursing note and vitals reviewed. Constitutional: She is oriented to person, place, and time. She appears well-developed and well-nourished.  HENT:  Head:  Normocephalic and atraumatic.  Nose: Nose normal.  Mouth/Throat: Uvula is midline, oropharynx is clear and moist and mucous membranes are normal.  Eyes: Conjunctivae and EOM are normal. Pupils are equal, round, and reactive to light.  Fundoscopic exam:      The right eye shows no hemorrhage and no papilledema.       The left eye shows no hemorrhage and no papilledema.  Neck: Normal range of motion.  Cardiovascular: Normal rate, regular rhythm, normal heart sounds and intact distal pulses.   No murmur heard. Pulmonary/Chest: Effort normal and breath sounds normal.  Abdominal: Soft. Bowel sounds are normal. She exhibits no distension.  Musculoskeletal: Normal range of motion. She exhibits no edema and no tenderness.  Neurological: She is alert and oriented to person, place, and time. She has normal strength. No cranial nerve deficit or sensory deficit. Coordination and gait normal.  Skin: Skin is warm and dry.  Psychiatric: She has a normal mood and affect. Her behavior is normal. Judgment and thought content normal.    ED Course  Procedures (including critical care time)  Labs Reviewed  POCT I-STAT, CHEM 8 - Abnormal; Notable for the following:    Glucose, Bld 265 (*)    Hemoglobin 11.2 (*)    HCT 33.0 (*)    All other components within normal limits  POCT URINALYSIS DIP (DEVICE) - Abnormal; Notable for the following:    Glucose, UA 250 (*)    Hgb urine dipstick MODERATE (*)    Protein, ur >=300 (*)    All other components within normal limits    No results found.   1. Hypertension   2. Medication refill   3. Diabetes mellitus type 2 with complications, uncontrolled     Results for orders placed during the hospital encounter of 04/02/11  POCT I-STAT, CHEM 8      Component Value Range   Sodium 136  135 - 145 (mEq/L)   Potassium 4.0  3.5 - 5.1 (mEq/L)   Chloride 109  96 - 112 (mEq/L)   BUN 17  6 - 23 (mg/dL)   Creatinine, Ser 1.00  0.50 - 1.10 (mg/dL)   Glucose, Bld 265  (*) 70 - 99 (mg/dL)   Calcium, Ion 1.20  1.12 - 1.32 (mmol/L)   TCO2 20  0 - 100 (mmol/L)   Hemoglobin 11.2 (*) 12.0 - 15.0 (g/dL)   HCT 33.0 (*) 36.0 - 46.0 (%)  POCT URINALYSIS DIP (DEVICE)      Component Value Range   Glucose, UA 250 (*) NEGATIVE (mg/dL)   Bilirubin Urine NEGATIVE  NEGATIVE    Ketones, ur NEGATIVE  NEGATIVE (mg/dL)   Specific Gravity,  Urine 1.020  1.005 - 1.030    Hgb urine dipstick MODERATE (*) NEGATIVE    pH 5.0  5.0 - 8.0    Protein, ur >=300 (*) NEGATIVE (mg/dL)   Urobilinogen, UA 0.2  0.0 - 1.0 (mg/dL)   Nitrite NEGATIVE  NEGATIVE    Leukocytes, UA NEGATIVE  NEGATIVE      MDM  Previous charts, labs reviewed. Patient was admitted to the hospital 01/29/2011 for uncontrolled diabetes, weakness, hypertension.CT and MRI were negative for stroke.  Right leg weakness and paresthesias were thought to be from diabetic neuropathy. . Echo showed EF 55%, severe LVH. She was discharged on 12/31 with [facility]. She was started on insulin 70/30,  subcutaneously twice daily, further titration was to be done under her PMD at [facility]. She was started on amlodipine lisinopril metoprolol for her uncontrolled hypertension. Blood pressure on discharge was 147/80. Glucose was in this range during her hospital stay.    Pt hypertensive today. Has not taken BP meds in 3 weeks. Pt has no evidence of new end organ damage. No change in kidney function. Proteinuria is present on last UA. Pt denies any CNS type sx such as HA, visual changes, focal paresis, or new onset seizure activity. Pt denies any CV sx such as CP, dyspnea, palpitations, pedal edema, tearing pain radiating to back or abd. Pt denied any renal sx such as anuria or hematuria. Pt denies illicit drug use, most notably cocaine, or recent use of OTC medications such as nasal decongestants. Had extensive discussion about  importance of lifestyle modifications, and the importance of taking all of her medications.  Stay she has improvement with [facility] unable first. Also refer her to the family practice Center, in case there are any other issues with access to [facility]. Advised patient to notify [facility] if she is able to get in the family practice Center. .Pt to f/u as OP.     Cherly Beach, MD 04/02/11 [phone]

## 2011-04-02 NOTE — Discharge Instructions (Signed)
You can also call the family practice Center, and try to be seen there. They are accepting new patients. Call on the first of each month, and they will see you if they can. If you're able to get into the family practice Center, then sure you notify [facility].  Make sure you take your medications as written, even if you have no symptoms.Marland Kitchen He can check your blood pressure at places like [facility], [facility], etc. You have signs of ongoing damage to your kidneys from uncontrolled diabetes and high blood pressure. Continue diet and exercise. Return to the ER if you have any facial, arm, or leg weakness, a severe or different headache, blurred vision, if you feel like you are about to pass out, if you pass out, have a seizure, or any other concerns.

## 2011-04-02 NOTE — ED Notes (Signed)
PT HERE FOR RX REFILL OF BP/DIABETES MEDICATION.RAN OUT FEB 2013.SX BLURRY VISION,H/A AND LEG/FEET PAIN S/P NEUROPATHY.PT NEED REFILL LISINOPRIL,METOPROLOL,FENOFIBRTE,AMILODIPINE,AND GABAPENTIN.PT HAS APPT WITH [facility] 05/03/11.BP 189/107.NO N/V/ OR SOB,CP

## 2011-04-03 ENCOUNTER — Emergency Department (HOSPITAL_COMMUNITY)
Admission: EM | Admit: 2011-04-03 | Discharge: 2011-04-04 | Disposition: A | Discharge status: Home / Self Care | Payer: Medicaid Other | Attending: Emergency Medicine | Admitting: Emergency Medicine

## 2011-04-03 ENCOUNTER — Encounter (HOSPITAL_COMMUNITY): Payer: Self-pay | Admitting: Emergency Medicine

## 2011-04-03 DIAGNOSIS — H9209 Otalgia, unspecified ear: Secondary | ICD-10-CM | POA: Insufficient documentation

## 2011-04-03 DIAGNOSIS — H6120 Impacted cerumen, unspecified ear: Secondary | ICD-10-CM | POA: Insufficient documentation

## 2011-04-03 DIAGNOSIS — H6690 Otitis media, unspecified, unspecified ear: Secondary | ICD-10-CM | POA: Insufficient documentation

## 2011-04-03 DIAGNOSIS — E119 Type 2 diabetes mellitus without complications: Secondary | ICD-10-CM | POA: Insufficient documentation

## 2011-04-03 DIAGNOSIS — Z794 Long term (current) use of insulin: Secondary | ICD-10-CM | POA: Insufficient documentation

## 2011-04-03 DIAGNOSIS — I10 Essential (primary) hypertension: Secondary | ICD-10-CM | POA: Insufficient documentation

## 2011-04-03 NOTE — ED Provider Notes (Signed)
History     CSN: [id number]  Arrival date & time 04/03/11  2322   First MD Initiated Contact with Patient 04/03/11 2332      Chief Complaint  Patient presents with  . Otalgia    Ear pain since three hours ago.    (Consider location/radiation/quality/duration/timing/severity/associated sxs/prior treatment) HPI Comments: Patient reports that she has had rhinorrhea, congestion, and cough for the past 5 days.  Today she began having pain in her right ear.  No pain in the left ear.  No drainage from the ears.  She denies any fevers or chills.  She denies any sinus pain or pressure.  Patient is a 46 y.o. female presenting with ear pain. The history is provided by the patient.  Otalgia This is a new problem. There is pain in the right ear. The problem occurs constantly. The problem has not changed since onset.There has been no fever. The pain is moderate. Associated symptoms include rhinorrhea and cough. Pertinent negatives include no ear discharge, no headaches, no sore throat, no abdominal pain, no vomiting, no neck pain and no rash. Her past medical history does not include chronic ear infection or hearing loss.    Past Medical History  Diagnosis Date  . Diabetes mellitus   . Hypertension   . Pericardial effusion   . Headache   . Arthritis   . Pneumonia   . Anemia     Past Surgical History  Procedure Date  . Cardiac surgery   . Tubal ligation     History reviewed. No pertinent family history.  History  Substance Use Topics  . Smoking status: Current Everyday Smoker -- 3.4 packs/day for 12 years    Types: Cigarettes  . Smokeless tobacco: Not on file  . Alcohol Use: No    OB History    Grav Para Term Preterm Abortions TAB SAB Ect Mult Living                  Review of Systems  Constitutional: Negative for fever and chills.  HENT: Positive for ear pain, congestion and rhinorrhea. Negative for sore throat, neck pain, sinus pressure, tinnitus and ear discharge.     Respiratory: Positive for cough.   Gastrointestinal: Negative for nausea, vomiting and abdominal pain.  Skin: Negative for color change and rash.  Neurological: Negative for dizziness, light-headedness and headaches.    Allergies  Review of patient's allergies indicates no known allergies.  Home Medications   Current Outpatient Rx  Name Route Sig Dispense Refill  . AMLODIPINE BESYLATE 10 MG PO TABS Oral Take 1 tablet (10 mg total) by mouth daily. 30 tablet 0  . ASPIRIN EC 81 MG PO TBEC Oral Take 81 mg by mouth every morning.     . FENOFIBRATE 160 MG PO TABS Oral Take 1 tablet (160 mg total) by mouth daily. 30 tablet 0  . GABAPENTIN 300 MG PO CAPS Oral Take 1 capsule (300 mg total) by mouth 3 (three) times daily. 90 capsule 0  . INSULIN ASPART PROT & ASPART (70-30) 100 UNIT/ML [place] SUSP Subcutaneous Inject 25 Units into the skin 2 (two) times daily with a meal. 40 mL 0  . LISINOPRIL 10 MG PO TABS Oral Take 1 tablet (10 mg total) by mouth daily. 30 tablet 0  . METFORMIN HCL 1000 MG PO TABS Oral Take 1 tablet (1,000 mg total) by mouth 2 (two) times daily with a meal. 180 tablet 0  . METOPROLOL TARTRATE 50 MG PO TABS Oral  Take 1 tablet (50 mg total) by mouth 2 (two) times daily. 60 tablet 0  . OMEPRAZOLE MAGNESIUM 20 MG PO TBEC Oral Take 1 tablet (20 mg total) by mouth daily. 30 tablet 0    BP 175/93  Pulse 93  Temp(Src) 97.7 F (36.5 C) (Oral)  Resp 18  Wt 224 lb (101.606 kg)  SpO2 100%  LMP 02/21/2011  Physical Exam  Nursing note and vitals reviewed. Constitutional: She is oriented to person, place, and time. She appears well-developed and well-nourished.  HENT:  Head: Normocephalic and atraumatic.  Right Ear: External ear normal. No drainage. No mastoid tenderness. Tympanic membrane is erythematous.  Left Ear: External ear normal. No drainage. No mastoid tenderness. Tympanic membrane is not erythematous and not bulging.  Nose: Mucosal edema and rhinorrhea present. Right sinus  exhibits no maxillary sinus tenderness and no frontal sinus tenderness. Left sinus exhibits no maxillary sinus tenderness and no frontal sinus tenderness.  Mouth/Throat: Uvula is midline, oropharynx is clear and moist and mucous membranes are normal.  Neck: Normal range of motion. Neck supple.  Cardiovascular: Normal rate, regular rhythm and normal heart sounds.   Pulmonary/Chest: Effort normal and breath sounds normal. No respiratory distress.  Lymphadenopathy:    She has no cervical adenopathy.  Neurological: She is alert and oriented to person, place, and time.  Skin: Skin is warm and dry. She is not diaphoretic. No erythema.    ED Course  Procedures (including critical care time)  Labs Reviewed - No data to display No results found.   No diagnosis found.  Patient with cerumen impaction of the right ear.  Ear wax was removed with a curette while in the ED to aid in visualization of the TM.    MDM  Signs and symptoms consistent with AOM.  Patient given prescription for Amoxicillin.        Sherlyn Lees [place], PA-C 04/04/11 [phone]

## 2011-04-04 MED ORDER — AMOXICILLIN 500 MG PO CAPS
1000.0000 mg | ORAL_CAPSULE | Freq: Three times a day (TID) | ORAL | Status: DC
  Administered 2011-04-04: 1000 mg via ORAL
  Filled 2011-04-04: qty 2

## 2011-04-04 MED ORDER — AMOXICILLIN 500 MG PO CAPS: 1000.0000 mg | ORAL_CAPSULE | Freq: Three times a day (TID) | ORAL | Status: AC

## 2011-04-04 NOTE — Discharge Instructions (Signed)

## 2011-04-04 NOTE — ED Provider Notes (Signed)
Medical screening examination/treatment/procedure(s) were performed by non-physician practitioner and as supervising physician I was immediately available for consultation/collaboration Rolland Porter, MD, Alanson Aly, MD 04/04/11 [phone]

## 2011-04-18 ENCOUNTER — Encounter (HOSPITAL_COMMUNITY): Payer: Self-pay

## 2011-04-18 ENCOUNTER — Emergency Department (HOSPITAL_COMMUNITY)
Admission: EM | Admit: 2011-04-18 | Discharge: 2011-04-18 | Disposition: A | Discharge status: Home / Self Care | Payer: Medicaid Other | Attending: Emergency Medicine | Admitting: Emergency Medicine

## 2011-04-18 DIAGNOSIS — I10 Essential (primary) hypertension: Secondary | ICD-10-CM | POA: Insufficient documentation

## 2011-04-18 DIAGNOSIS — F17200 Nicotine dependence, unspecified, uncomplicated: Secondary | ICD-10-CM | POA: Insufficient documentation

## 2011-04-18 DIAGNOSIS — M129 Arthropathy, unspecified: Secondary | ICD-10-CM | POA: Insufficient documentation

## 2011-04-18 DIAGNOSIS — Z794 Long term (current) use of insulin: Secondary | ICD-10-CM | POA: Insufficient documentation

## 2011-04-18 DIAGNOSIS — Z79899 Other long term (current) drug therapy: Secondary | ICD-10-CM | POA: Insufficient documentation

## 2011-04-18 DIAGNOSIS — E119 Type 2 diabetes mellitus without complications: Secondary | ICD-10-CM | POA: Insufficient documentation

## 2011-04-18 DIAGNOSIS — R29898 Other symptoms and signs involving the musculoskeletal system: Secondary | ICD-10-CM

## 2011-04-18 LAB — POCT I-STAT, CHEM 8
BUN: 26 mg/dL — ABNORMAL HIGH (ref 6–23)
Calcium, Ion: 1.32 mmol/L (ref 1.12–1.32)
Chloride: 106 mEq/L (ref 96–112)
Creatinine, Ser: 1.1 mg/dL (ref 0.50–1.10)
Glucose, Bld: 152 mg/dL — ABNORMAL HIGH (ref 70–99)
HCT: 30 % — ABNORMAL LOW (ref 36.0–46.0)

## 2011-04-18 NOTE — ED Provider Notes (Signed)
History     CSN: [id number]  Arrival date & time 04/18/11  1350   First MD Initiated Contact with Patient 04/18/11 1503      Chief Complaint  Patient presents with  . Leg Pain    (Consider location/radiation/quality/duration/timing/severity/associated sxs/prior treatment) HPI History provided by pt.   Pt has had constant, gradually increasing weakness of RLE for the past two months.  It is becoming more and more difficult to raise her leg and to walk.  Associated w/ paresthesias right lateral lower leg.  Denies fever, back pain, LE pain, saddle anesthesia and bladder/bowel incontinence.  No known trauma.  Pt has no h/o low back pain.   Past Medical History  Diagnosis Date  . Diabetes mellitus   . Hypertension   . Pericardial effusion   . Headache   . Arthritis   . Pneumonia   . Anemia     Past Surgical History  Procedure Date  . Cardiac surgery   . Tubal ligation     No family history on file.  History  Substance Use Topics  . Smoking status: Current Everyday Smoker -- 3.4 packs/day for 12 years    Types: Cigarettes  . Smokeless tobacco: Not on file  . Alcohol Use: No    OB History    Grav Para Term Preterm Abortions TAB SAB Ect Mult Living                  Review of Systems  All other systems reviewed and are negative.    Allergies  Review of patient's allergies indicates no known allergies.  Home Medications   Current Outpatient Rx  Name Route Sig Dispense Refill  . AMLODIPINE BESYLATE 10 MG PO TABS Oral Take 1 tablet (10 mg total) by mouth daily. 30 tablet 0  . ASPIRIN EC 81 MG PO TBEC Oral Take 81 mg by mouth every morning.     . FENOFIBRATE 160 MG PO TABS Oral Take 1 tablet (160 mg total) by mouth daily. 30 tablet 0  . GABAPENTIN 300 MG PO CAPS Oral Take 1 capsule (300 mg total) by mouth 3 (three) times daily. 90 capsule 0  . INSULIN ASPART PROT & ASPART (70-30) 100 UNIT/ML [place] SUSP Subcutaneous Inject 25 Units into the skin 2 (two) times daily  with a meal. 40 mL 0  . LISINOPRIL 10 MG PO TABS Oral Take 1 tablet (10 mg total) by mouth daily. 30 tablet 0  . METFORMIN HCL 1000 MG PO TABS Oral Take 1 tablet (1,000 mg total) by mouth 2 (two) times daily with a meal. 180 tablet 0  . METOPROLOL TARTRATE 50 MG PO TABS Oral Take 1 tablet (50 mg total) by mouth 2 (two) times daily. 60 tablet 0  . OMEPRAZOLE MAGNESIUM 20 MG PO TBEC Oral Take 1 tablet (20 mg total) by mouth daily. 30 tablet 0    BP 169/85  Pulse 85  Temp 98.6 F (37 C)  Resp 18  SpO2 98%  LMP 02/21/2011  Physical Exam  Nursing note and vitals reviewed. Constitutional: She is oriented to person, place, and time. She appears well-developed and well-nourished.  HENT:  Head: Normocephalic and atraumatic.  Eyes:       Normal appearance  Neck: Normal range of motion.  Cardiovascular: Normal rate and regular rhythm.   Pulmonary/Chest: Effort normal and breath sounds normal.  Musculoskeletal:       Lumbar spinal and paraspinal non-tender.  Active flexion of right hip is limited  and decreased strength in quads compared to LLE.  Symmetric hamstring strength. Both patellas are hyporeflexive.  No saddle anesthesia. Distal sensation intact.  2+ DP pulses.  Pt walks w/ nml gait and no evidence of foot drop.   Neurological: She is alert and oriented to person, place, and time.  Skin: Skin is warm and dry. No rash noted.  Psychiatric: She has a normal mood and affect. Her behavior is normal.    ED Course  Procedures (including critical care time)  Labs Reviewed  GLUCOSE, CAPILLARY - Abnormal; Notable for the following:    Glucose-Capillary 164 (*)    All other components within normal limits  POCT I-STAT, CHEM 8 - Abnormal; Notable for the following:    BUN 26 (*)    Glucose, Bld 152 (*)    Hemoglobin 10.2 (*)    HCT 30.0 (*)    All other components within normal limits   No results found.   1. Lower extremity weakness       MDM  46yo F presents w/ non-traumatic,  non-painful, RLE weakness x 2 months.  On exam, no lumbar spine tenderness,  No NV deficits of LEs, symmetrically hyporeflexive patellas, limited active flexion right hip, decreased right quad strength compared to left, nml gait and no foot drop.  No indication for emergent MRI today.  Discussed w/ Dr. Lauris Poag and he recommends checking electrolytes and then setting up for outpatient MRI of lumbar spine tomorrow.  Pt in agreement w/ plan.   Chem 8 unremarkable.  Results discussed w/ pt.  Outpatient MRI ordered and pt d/c'd home.         Remer Macho, [place] 04/18/11 [phone]

## 2011-04-18 NOTE — ED Notes (Signed)
Patient here with increasing right leg numbness and weakness for months, reports that she thinks it has to do with her hip. Patient states that it feels heavy and she picks it up to get in car

## 2011-04-18 NOTE — ED Provider Notes (Signed)
Medical screening examination/treatment/procedure(s) were performed by non-physician practitioner and as supervising physician I was immediately available for consultation/collaboration.   Dammon Makarewicz A. Lauris Poag, MD 04/18/11 2348

## 2011-04-18 NOTE — Discharge Instructions (Signed)
Call the MRI department in the morning to find out what time to come to the hospital and where you should go.  The phone number in [phone].  Your test has been ordered in the computer.   You may return to the ER if symptoms worsen or you have any other concerns.

## 2011-04-19 ENCOUNTER — Ambulatory Visit (HOSPITAL_COMMUNITY)
Admission: RE | Admit: 2011-04-19 | Discharge: 2011-04-19 | Disposition: A | Discharge status: Home / Self Care | Payer: Medicaid Other | Source: Ambulatory Visit | Attending: Emergency Medicine | Admitting: Emergency Medicine

## 2011-04-19 DIAGNOSIS — M79609 Pain in unspecified limb: Secondary | ICD-10-CM | POA: Insufficient documentation

## 2011-04-19 DIAGNOSIS — M5126 Other intervertebral disc displacement, lumbar region: Secondary | ICD-10-CM | POA: Insufficient documentation

## 2011-04-19 DIAGNOSIS — N281 Cyst of kidney, acquired: Secondary | ICD-10-CM | POA: Insufficient documentation

## 2011-04-19 DIAGNOSIS — R29898 Other symptoms and signs involving the musculoskeletal system: Secondary | ICD-10-CM | POA: Insufficient documentation

## 2011-04-19 DIAGNOSIS — M5380 Other specified dorsopathies, site unspecified: Secondary | ICD-10-CM | POA: Insufficient documentation

## 2011-04-19 DIAGNOSIS — D259 Leiomyoma of uterus, unspecified: Secondary | ICD-10-CM | POA: Insufficient documentation

## 2011-04-20 NOTE — ED Provider Notes (Signed)
At request of flow manager, I called the patient to give her the results of the MRI lumbar spine that was ordered as an outpatient.  Patient does not have PCP for results to be reported to.  I have discussed results with patient and advised that she follow up with Dr Sherwood Gambler, neurosurgeon who was on call on the day of her ED visit.  I have spoken with the Flow Manager who will help facilitate this and I have added it to her discharge instructions from her visit on 04/18/11.  Patient verbalizes understanding and agrees with plan.    Otis Brace, [place] 04/20/11 1610

## 2011-04-21 NOTE — ED Provider Notes (Signed)
Medical screening examination/treatment/procedure(s) were performed by non-physician practitioner and as supervising physician I was immediately available for consultation/collaboration.   Grainne Knights A. Lauris Poag, MD 04/21/11 [phone]

## 2011-05-12 ENCOUNTER — Ambulatory Visit (INDEPENDENT_AMBULATORY_CARE_PROVIDER_SITE_OTHER): Payer: Medicaid Other | Admitting: Family Medicine

## 2011-05-12 ENCOUNTER — Encounter: Payer: Self-pay | Admitting: Family Medicine

## 2011-05-12 VITALS — BP 190/101 | HR 86 | Temp 98.0°F | Ht 68.35 in | Wt 223.5 lb

## 2011-05-12 DIAGNOSIS — IMO0002 Reserved for concepts with insufficient information to code with codable children: Secondary | ICD-10-CM

## 2011-05-12 DIAGNOSIS — I519 Heart disease, unspecified: Secondary | ICD-10-CM

## 2011-05-12 DIAGNOSIS — E118 Type 2 diabetes mellitus with unspecified complications: Secondary | ICD-10-CM

## 2011-05-12 DIAGNOSIS — I10 Essential (primary) hypertension: Secondary | ICD-10-CM

## 2011-05-12 DIAGNOSIS — I5189 Other ill-defined heart diseases: Secondary | ICD-10-CM

## 2011-05-12 DIAGNOSIS — E1165 Type 2 diabetes mellitus with hyperglycemia: Secondary | ICD-10-CM

## 2011-05-12 DIAGNOSIS — R29898 Other symptoms and signs involving the musculoskeletal system: Secondary | ICD-10-CM

## 2011-05-12 MED ORDER — INSULIN ASPART PROT & ASPART (70-30 MIX) 100 UNIT/ML ~~LOC~~ SUSP: SUBCUTANEOUS | Status: DC

## 2011-05-12 MED ORDER — METOPROLOL TARTRATE 50 MG PO TABS: 50.0000 mg | ORAL_TABLET | Freq: Two times a day (BID) | ORAL | Status: DC

## 2011-05-12 MED ORDER — FREESTYLE SYSTEM KIT: 1.0000 | PACK | Status: DC | PRN

## 2011-05-12 MED ORDER — GABAPENTIN 300 MG PO CAPS: 300.0000 mg | ORAL_CAPSULE | Freq: Three times a day (TID) | ORAL | Status: DC

## 2011-05-12 MED ORDER — LISINOPRIL 20 MG PO TABS: 20.0000 mg | ORAL_TABLET | Freq: Every day | ORAL | Status: DC

## 2011-05-12 MED ORDER — METFORMIN HCL 1000 MG PO TABS: 1000.0000 mg | ORAL_TABLET | Freq: Two times a day (BID) | ORAL | Status: DC

## 2011-05-12 NOTE — Assessment & Plan Note (Signed)
Patient has recently seen neurosurgeon for concern for herniated lumbar disc causing right leg weakness, however, he did not consider her a surgical candidate and referred her to a neurologist.  She seems to have "nerve pain" throughout although it is worse on her right lower extremity. Did not have time to fully cover this issue since this was an initial visit for her but have asked patient to make follow-up appointment next week to discuss further.  Diabetic neuropathy high on differential. Have asked her to resume her gabapentin for now and titrate up prn.  Follow-up 1 week.

## 2011-05-12 NOTE — Assessment & Plan Note (Addendum)
HgbA1c 8.4 today.  Will increase 70/30 to 30 units in the AM, continue 25 units in the PM. She now has Medicaid so consider changing to Lantus/Novolog if suboptimal control.  Refer to ophthalmologist for routine diabetic eye exam. No complaints of vision problems.  Continue gabapentin for possible neuropathy. Patient complaining of "nerve pain" particularly in her right leg. Currently on gabapentin 1 tablet tid. May increase to 3 tablets tid prn. Discussed with patient about titrating up prn but cautioned against drowsiness side effect. Patient expressed understanding and agreement.  Follow-up 1 month.

## 2011-05-12 NOTE — Assessment & Plan Note (Addendum)
Poorly controlled currently. She has not taken lisinopril or Norvasc for 1 week (ran out).  Will increase lisinopril from 10 to 20, continue metoprolol, and discontinue Norvasc.  Consider adding diuretic for lower extremity edema if persistent and bothersome to patient who has grade 2 diastolic dysfunction. Follow-up 1 week.

## 2011-05-12 NOTE — Progress Notes (Signed)
  Subjective:    Patient ID: [name], female    DOB: 1965-11-10, 46 y.o.   MRN: [id number]  HPI Initial patient visit: She had been followed by Dr. Linna Darner [facility] [address] until about 1 year ago when she lost her insurance. Now she has Medicaid.  1. Hypertension She has not taken her medications for about 1 week (ran out of Norvasc and lisinopril but has been taking metoprolol). Does not measure her BP at home.  ROS: denies chest pain, difficulty breathing, headache  2. Diabetes Reports compliance with 70/30 and metformin Does not check BS at home She has been complaining of worsening "nerve pain" in her right leg (see below)  3. Right leg pain Bulging disc in her back. Recently evaluated by neurosurgeon who did not deem her surgical candidate. Referring to neurologist Gabapentin is not helping but has not taken for 1 week ROS: denies bladder/stool incontinence; denies falls or weakness  Review of Systems Per HPI    Objective:   Physical Exam Gen: NAD Psych: appropriate CV: RRR, no m/r/g Pulm: CTAB without w/r/r Abd: NABS, soft, NT, ND Ext: 0-1+ pretibial edema Feet:    Warm, dry, no lesions   Toenails groomed   Sensation intact throughout   2+ pedal pulses bilaterally    Assessment & Plan:

## 2011-05-12 NOTE — Patient Instructions (Signed)
Diabetes: -Insulin: take 30 units in the morning, 20 units in the afternoon. -Continue metformin  -Record your sugars 4 times a day: before every meal and at bedtime. -Follow-up in 1 month for your diabetes  High blood pressure: -Increase lisinopril to 20 mg daily -Continue metoprolol -Stop Norvasc (amlodipine) -Measure your blood pressure at a pharmacy at least one time before your next appointment  Follow-up in 1 week to discuss your nerve pain and your blood pressure

## 2011-05-14 ENCOUNTER — Encounter: Payer: Self-pay | Admitting: Family Medicine

## 2011-05-19 ENCOUNTER — Ambulatory Visit (INDEPENDENT_AMBULATORY_CARE_PROVIDER_SITE_OTHER): Payer: Medicaid Other | Admitting: Family Medicine

## 2011-05-19 ENCOUNTER — Encounter: Payer: Self-pay | Admitting: Family Medicine

## 2011-05-19 VITALS — BP 196/93 | HR 98 | Temp 98.1°F | Ht 68.3 in | Wt 236.0 lb

## 2011-05-19 DIAGNOSIS — M1710 Unilateral primary osteoarthritis, unspecified knee: Secondary | ICD-10-CM

## 2011-05-19 DIAGNOSIS — M1712 Unilateral primary osteoarthritis, left knee: Secondary | ICD-10-CM

## 2011-05-19 DIAGNOSIS — I10 Essential (primary) hypertension: Secondary | ICD-10-CM

## 2011-05-19 DIAGNOSIS — R29898 Other symptoms and signs involving the musculoskeletal system: Secondary | ICD-10-CM

## 2011-05-19 DIAGNOSIS — N182 Chronic kidney disease, stage 2 (mild): Secondary | ICD-10-CM

## 2011-05-19 DIAGNOSIS — R609 Edema, unspecified: Secondary | ICD-10-CM | POA: Insufficient documentation

## 2011-05-19 MED ORDER — LISINOPRIL 40 MG PO TABS: 40.0000 mg | ORAL_TABLET | Freq: Every day | ORAL | Status: DC

## 2011-05-19 MED ORDER — FUROSEMIDE 20 MG PO TABS: 20.0000 mg | ORAL_TABLET | Freq: Every day | ORAL | Status: DC

## 2011-05-19 NOTE — Assessment & Plan Note (Addendum)
Remains poorly controlled despite compliance with lisinopril and metoprolol. Anticipated elevated blood pressure since discontinued Norvasc.  Will increase lisinopril from 20 to 40mg  daily.  Will add diuretic, Lasix, due to history of grade 2 diastolic dysfunction with presumed systolic function as well due to EF 50-55% and lower extremity swelling without rales. Gabapentin does cause fluid retention in about 5-10% of cases, so this may be contributing as well, but I would like patient to continue this medication for now since causing significant relief of right lower extremity neuropathy.  Will check BMET for Cr and K today. Last BMET 04/2011 okay>>>Cr 1.38 today (baseline 1.0-1.3), K 4.4. Will monitor Cr for now.  Cautioned patient against symptoms of hypotension.  Follow-up 2-4 weeks.

## 2011-05-19 NOTE — Progress Notes (Signed)
  Subjective:    Patient ID: [name], female    DOB: 19-Oct-1965, 46 y.o.   MRN: [id number]  HPI 1. Follow-up right leg pain Improved with gabapentin  2. Follow-up bloating/swelling, hypertension Now having significant swelling legs and hands Compliant with blood pressure medications Does not measure BP at home  ROS: denies chest pain, difficulty breathing  3. Left knee pain History of arthritis in that knee Last steroid injection early 2012 at [facility] Would like another injection  Review of Systems Per HPI    Objective:   Physical Exam Gen: NAD Psych: engaged, appropriate CV: RRR (HR 90s), no S3/S4, no murmurs Pulm: CTAB without w/r/r Ext: 2+ pitting pretibial edema to knee; no chronic venous stasis skin changes; skin is smooth and tight MSK:   Left knee: mildly warm compared to right; no tenderness or erythema  Procedure note: left knee glucocorticoid injection Consent received. Left medial knee sterilized with alcohol swabs. Gloves donned. Anatomy marked. Injected 37mL triamcinolone and 12mL lidocaine with 21 gauge needle. Needle inserted left medial patella with ease and preparation injected. Minimal bleeding after needle removed. Covered injection site with bandaid.    Assessment & Plan:

## 2011-05-19 NOTE — Assessment & Plan Note (Signed)
Previously seen by [facility] for this.  Last steroid injection about 1 year ago. Requesting one today.  Consent received, and knee injected.  Cautioned against symptoms/signs of infection.

## 2011-05-19 NOTE — Patient Instructions (Signed)
-  For your swelling and blood pressure, take the Lasix.  If you feel lightheaded, take half the dose.  -Increase lisinopril to 40 mg daily  For your knee, if it becomes red/swollen/fevers/other signs of infection, call clinic and let me know.   Follow-up in 2-4 weeks.

## 2011-05-19 NOTE — Assessment & Plan Note (Signed)
Suspect diabetic neuropathy.  Improved with gabapentin. Resume.

## 2011-05-19 NOTE — Assessment & Plan Note (Signed)
See hypertension A/P.

## 2011-05-20 ENCOUNTER — Telehealth: Payer: Self-pay | Admitting: *Deleted

## 2011-05-20 LAB — BASIC METABOLIC PANEL
BUN: 21 mg/dL (ref 6–23)
CO2: 23 mEq/L (ref 19–32)
Chloride: 107 mEq/L (ref 96–112)
Creat: 1.38 mg/dL — ABNORMAL HIGH (ref 0.50–1.10)
Glucose, Bld: 83 mg/dL (ref 70–99)

## 2011-05-20 NOTE — Telephone Encounter (Signed)
Patient in office today for diabetic eye exam with Dr. Jari Pigg.  Patient has Medicaid and they need our NPI # to authorize the visit.  NPI # given.  Nolene Ebbs, RN

## 2011-05-21 ENCOUNTER — Encounter: Payer: Self-pay | Admitting: Family Medicine

## 2011-05-21 DIAGNOSIS — N186 End stage renal disease: Secondary | ICD-10-CM | POA: Insufficient documentation

## 2011-05-21 DIAGNOSIS — Z992 Dependence on renal dialysis: Secondary | ICD-10-CM | POA: Insufficient documentation

## 2011-05-21 DIAGNOSIS — E11319 Type 2 diabetes mellitus with unspecified diabetic retinopathy without macular edema: Secondary | ICD-10-CM | POA: Insufficient documentation

## 2011-05-21 NOTE — Assessment & Plan Note (Signed)
Documentation only. Seen at [facility] 04/18. Retinopathy detected. Referred to specialist.

## 2011-05-24 ENCOUNTER — Telehealth: Payer: Self-pay | Admitting: Family Medicine

## 2011-05-24 MED ORDER — LISINOPRIL-HYDROCHLOROTHIAZIDE 20-25 MG PO TABS: 2.0000 | ORAL_TABLET | Freq: Every day | ORAL | Status: DC

## 2011-05-24 NOTE — Telephone Encounter (Signed)
Patient informed of message from Dr. Verdie Drown.  Nolene Ebbs, RN

## 2011-05-24 NOTE — Telephone Encounter (Signed)
Called patient and left message. I was not aware patient was on combination medication (she is a new patient who I recently started to see). I notified patient that I would call in Rx for lisinoril 20/HCTZ 25. Recommended taking 1 tablet daily for about 2 days. If she tolerates well (no symptoms of hypotension), may increase to 2 tablets daily.  Patient has follow-up scheduled 05/07.

## 2011-05-24 NOTE — Telephone Encounter (Signed)
Lasix is not working and feels like she should get the lisinopril-hctz to help with the water retention  [place]

## 2011-05-24 NOTE — Telephone Encounter (Signed)
Returned call to patient.  States she has been "retaining fluid and legs are swollen every morning."  Was Rx'd Lasix at last office visit and it is not helping.  Normally takes Lisinopril-HCTZ (25mg ), but was given only Lisinopril.  Patient states that HCTZ works better for her.  Will route note to Dr. Verdie Drown and call patient back.  Nolene Ebbs, RN

## 2011-05-31 ENCOUNTER — Telehealth: Payer: Self-pay | Admitting: Family Medicine

## 2011-05-31 NOTE — Telephone Encounter (Signed)
Called and left message regarding fax from [place].  Patient was started on gabapentin for radiating leg pain/sciatica.  Dr. Verdie Drown had documented that she was to take 300 mg by mouth three times a day.  The fax from [facility] says patient reports to them she is taking three 300 mg tablets (900 mg) three times a day.  I called to clarify as we have no documentation of this, patient should not be self titrating this medication without speaking to her physician. It has side effects and needs to be titrated slowly.    Dr. Verdie Drown is [place] on [place] mission trip, so I am unable to verify dosage with her.  Patient does have an appointment with her on 5/7.  Will try to touch base with her about this.   [name] 05/31/2011 2:05 PM

## 2011-06-08 ENCOUNTER — Ambulatory Visit (INDEPENDENT_AMBULATORY_CARE_PROVIDER_SITE_OTHER): Payer: Medicaid Other | Admitting: Family Medicine

## 2011-06-08 ENCOUNTER — Encounter: Payer: Self-pay | Admitting: Family Medicine

## 2011-06-08 VITALS — BP 184/97 | HR 79 | Temp 97.6°F | Ht 68.3 in | Wt 232.0 lb

## 2011-06-08 DIAGNOSIS — R609 Edema, unspecified: Secondary | ICD-10-CM

## 2011-06-08 DIAGNOSIS — IMO0002 Reserved for concepts with insufficient information to code with codable children: Secondary | ICD-10-CM

## 2011-06-08 DIAGNOSIS — E118 Type 2 diabetes mellitus with unspecified complications: Secondary | ICD-10-CM

## 2011-06-08 DIAGNOSIS — I10 Essential (primary) hypertension: Secondary | ICD-10-CM

## 2011-06-08 DIAGNOSIS — R29898 Other symptoms and signs involving the musculoskeletal system: Secondary | ICD-10-CM

## 2011-06-08 MED ORDER — VALSARTAN-HYDROCHLOROTHIAZIDE 160-25 MG PO TABS: 1.0000 | ORAL_TABLET | Freq: Every day | ORAL | Status: DC

## 2011-06-08 MED ORDER — PREGABALIN 50 MG PO CAPS: 50.0000 mg | ORAL_CAPSULE | Freq: Three times a day (TID) | ORAL | Status: DC

## 2011-06-08 NOTE — Progress Notes (Signed)
  Subjective:    Patient ID: [name], female    DOB: 08-04-1965, 46 y.o.   MRN: [id number]  HPI 1. Follow-up: hypertension, edema BP remains elevated despite compliance with lisinopril/HCTZ and metoprolol.  Edema improved however after starting diuretic. ROS: denies CP, difficulty breathing  2. Patient requesting switching gabapentin to Lyrica due to increase in weight (5+ pounds) Gabapentin helping neuropathic pain  Review of Systems Denies numbness, urinary/stool incontinence    Objective:   Physical Exam Gen: NAD Neuro: using cane to walk now (different from before) CV: RRR, 3/6 systolic murmur Pulm: CTAB without w/r/r Ext: 1-2+ pretibial edema    Assessment & Plan:

## 2011-06-08 NOTE — Assessment & Plan Note (Signed)
Improved on HCTZ. Continue.

## 2011-06-08 NOTE — Assessment & Plan Note (Signed)
Will have initial appointment with Dr. Jannifer Franklin (neurologist) 05/20.  Stop gabapentin due to weight gain (fluid weight) and start pregabalin.  Patient requesting physician's approval/recommendation in filing for disability. 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 I would like Dr. Jannifer Franklin' input first before agreeing to approving her disability. Patient seems more deconditioned today, using her cane.

## 2011-06-08 NOTE — Assessment & Plan Note (Signed)
Not well controlled.  Patient requesting switch from lisinopril to valsartan, which she reports controlled her BP in the past. Will switch to Diovan HCT. Start at lower dose, then self-titrate up to maximum dose (320/50) based on home pressures.  Continue metoprolol.  No longer taking Lasix since starting HCTZ.

## 2011-06-08 NOTE — Patient Instructions (Addendum)
For blood pressure: STOP lisinopril/HCTZ START Diovan/HCTZ. Take 1 tablet daily. If your BP is >150/90 in 1 week, then try 2 tablet daily.   For neuropathy: STOP gabapentin. START Lyrica.   Follow-up in 1 month.

## 2011-06-15 ENCOUNTER — Telehealth: Payer: Self-pay | Admitting: *Deleted

## 2011-06-15 NOTE — Telephone Encounter (Signed)
PA required for Lyrica. Form was completed and faxed. Approval received today. Pharmacy notified.

## 2011-06-18 ENCOUNTER — Telehealth: Payer: Self-pay | Admitting: *Deleted

## 2011-06-18 NOTE — Telephone Encounter (Signed)
Patient has an appt on 06/21/11.  Due to patient having Medicaid, office calling to request NPI #  to authorize appt.  NPI # given.  Nolene Ebbs, RN

## 2011-06-21 ENCOUNTER — Other Ambulatory Visit: Payer: Self-pay | Admitting: Family Medicine

## 2011-06-21 DIAGNOSIS — R29898 Other symptoms and signs involving the musculoskeletal system: Secondary | ICD-10-CM

## 2011-06-21 MED ORDER — GABAPENTIN 300 MG PO CAPS: ORAL_CAPSULE | ORAL | Status: DC

## 2011-06-21 NOTE — Telephone Encounter (Signed)
Patient's insurance is not covering pregabalin.  So will re-start gabapentin 300-900 mg tid prn. Rx sent to pharmacy.  Patient notified.

## 2011-06-21 NOTE — Telephone Encounter (Signed)
Pt needs a rx refill for Pregabalin (Cap). Also she needs the quantity changed. Dr. Verdie Drown told her to take 3 x a day, but the quantity was never changed to meet the need.

## 2011-06-22 ENCOUNTER — Other Ambulatory Visit: Payer: Self-pay | Admitting: Family Medicine

## 2011-06-22 ENCOUNTER — Encounter: Payer: Self-pay | Admitting: Family Medicine

## 2011-06-22 DIAGNOSIS — G629 Polyneuropathy, unspecified: Secondary | ICD-10-CM

## 2011-06-22 MED ORDER — INSULIN ASPART PROT & ASPART (70-30 MIX) 100 UNIT/ML ~~LOC~~ SUSP: 25.0000 [IU] | Freq: Two times a day (BID) | SUBCUTANEOUS | Status: AC

## 2011-06-22 NOTE — Assessment & Plan Note (Signed)
Documentation only. Seen by Dr. Jannifer Franklin on 05/20. Bilateral lower extremity weakness concerning for thoracic myelopathy. MRI thoracic and cervical spine ordered. RTC 2 months.

## 2011-06-24 ENCOUNTER — Other Ambulatory Visit: Payer: Self-pay | Admitting: Neurology

## 2011-06-24 DIAGNOSIS — M6281 Muscle weakness (generalized): Secondary | ICD-10-CM

## 2011-06-24 DIAGNOSIS — R269 Unspecified abnormalities of gait and mobility: Secondary | ICD-10-CM

## 2011-06-24 DIAGNOSIS — M4714 Other spondylosis with myelopathy, thoracic region: Secondary | ICD-10-CM

## 2011-06-24 DIAGNOSIS — R209 Unspecified disturbances of skin sensation: Secondary | ICD-10-CM

## 2011-07-03 ENCOUNTER — Ambulatory Visit
Admission: RE | Admit: 2011-07-03 | Discharge: 2011-07-03 | Disposition: A | Discharge status: Home / Self Care | Payer: Medicaid Other | Source: Ambulatory Visit | Attending: Neurology | Admitting: Neurology

## 2011-07-03 DIAGNOSIS — R209 Unspecified disturbances of skin sensation: Secondary | ICD-10-CM

## 2011-07-03 DIAGNOSIS — R269 Unspecified abnormalities of gait and mobility: Secondary | ICD-10-CM

## 2011-07-03 DIAGNOSIS — M4714 Other spondylosis with myelopathy, thoracic region: Secondary | ICD-10-CM

## 2011-07-03 DIAGNOSIS — M6281 Muscle weakness (generalized): Secondary | ICD-10-CM

## 2011-07-03 MED ORDER — GADOBENATE DIMEGLUMINE 529 MG/ML IV SOLN
20.0000 mL | Freq: Once | INTRAVENOUS | Status: AC | PRN
  Administered 2011-07-03: 20 mL via INTRAVENOUS

## 2011-07-12 ENCOUNTER — Encounter: Payer: Self-pay | Admitting: Family Medicine

## 2011-07-12 DIAGNOSIS — G629 Polyneuropathy, unspecified: Secondary | ICD-10-CM

## 2011-07-12 NOTE — Assessment & Plan Note (Signed)
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

## 2011-07-13 ENCOUNTER — Other Ambulatory Visit: Payer: Self-pay | Admitting: Neurology

## 2011-07-13 DIAGNOSIS — R269 Unspecified abnormalities of gait and mobility: Secondary | ICD-10-CM

## 2011-07-13 DIAGNOSIS — M4714 Other spondylosis with myelopathy, thoracic region: Secondary | ICD-10-CM

## 2011-07-13 DIAGNOSIS — M6281 Muscle weakness (generalized): Secondary | ICD-10-CM

## 2011-07-16 ENCOUNTER — Ambulatory Visit
Admission: RE | Admit: 2011-07-16 | Discharge: 2011-07-16 | Disposition: A | Discharge status: Home / Self Care | Payer: Medicaid Other | Source: Ambulatory Visit | Attending: Neurology | Admitting: Neurology

## 2011-07-16 VITALS — BP 201/103 | HR 83

## 2011-07-16 VITALS — BP 162/84 | HR 79

## 2011-07-16 DIAGNOSIS — M6281 Muscle weakness (generalized): Secondary | ICD-10-CM

## 2011-07-16 DIAGNOSIS — R269 Unspecified abnormalities of gait and mobility: Secondary | ICD-10-CM

## 2011-07-16 DIAGNOSIS — M4714 Other spondylosis with myelopathy, thoracic region: Secondary | ICD-10-CM

## 2011-07-16 LAB — CSF CELL COUNT WITH DIFFERENTIAL: Tube #: 3

## 2011-07-16 LAB — PROTEIN, CSF: Total Protein, CSF: 57 mg/dL — ABNORMAL HIGH (ref 15–45)

## 2011-07-16 LAB — GLUCOSE, CSF: Glucose, CSF: 93 mg/dL — ABNORMAL HIGH (ref 43–76)

## 2011-07-16 MED ORDER — DIAZEPAM 5 MG PO TABS
10.0000 mg | ORAL_TABLET | Freq: Once | ORAL | Status: AC
  Administered 2011-07-16: 10 mg via ORAL

## 2011-07-16 MED ORDER — IOHEXOL 180 MG/ML  SOLN
18.0000 mL | Freq: Once | INTRAMUSCULAR | Status: AC | PRN
  Administered 2011-07-16: 18 mL via INTRATHECAL

## 2011-07-16 MED ORDER — ONDANSETRON HCL 4 MG/2ML IJ SOLN: 4.0000 mg | Freq: Four times a day (QID) | INTRAMUSCULAR | Status: DC | PRN

## 2011-07-16 NOTE — Progress Notes (Addendum)
Pt had blood drawn from left antecube for process with her spinal fluid. Unsuccessful X1 in right antecube. Pt tolerated blood draw well, 2 tiger tops drawn. 1010 pt resting quietly will get VS before pt is discharged.dd

## 2011-07-16 NOTE — Discharge Instructions (Signed)

## 2011-07-19 ENCOUNTER — Ambulatory Visit (HOSPITAL_COMMUNITY)
Admission: RE | Admit: 2011-07-19 | Discharge: 2011-07-19 | Disposition: A | Discharge status: Home / Self Care | Payer: Medicaid Other | Source: Ambulatory Visit | Attending: Sports Medicine | Admitting: Sports Medicine

## 2011-07-19 ENCOUNTER — Ambulatory Visit (INDEPENDENT_AMBULATORY_CARE_PROVIDER_SITE_OTHER): Payer: Medicaid Other | Admitting: Family Medicine

## 2011-07-19 ENCOUNTER — Encounter: Payer: Self-pay | Admitting: Family Medicine

## 2011-07-19 VITALS — BP 186/111 | HR 88 | Ht 69.0 in | Wt 233.0 lb

## 2011-07-19 DIAGNOSIS — K5900 Constipation, unspecified: Secondary | ICD-10-CM | POA: Insufficient documentation

## 2011-07-19 DIAGNOSIS — I10 Essential (primary) hypertension: Secondary | ICD-10-CM

## 2011-07-19 DIAGNOSIS — R1013 Epigastric pain: Secondary | ICD-10-CM | POA: Insufficient documentation

## 2011-07-19 DIAGNOSIS — R109 Unspecified abdominal pain: Secondary | ICD-10-CM

## 2011-07-19 DIAGNOSIS — Q619 Cystic kidney disease, unspecified: Secondary | ICD-10-CM | POA: Insufficient documentation

## 2011-07-19 DIAGNOSIS — R110 Nausea: Secondary | ICD-10-CM | POA: Insufficient documentation

## 2011-07-19 DIAGNOSIS — D259 Leiomyoma of uterus, unspecified: Secondary | ICD-10-CM | POA: Insufficient documentation

## 2011-07-19 LAB — POCT UA - MICROSCOPIC ONLY

## 2011-07-19 LAB — POCT URINALYSIS DIPSTICK
Bilirubin, UA: NEGATIVE
Glucose, UA: NEGATIVE
Spec Grav, UA: 1.025
Urobilinogen, UA: 0.2

## 2011-07-19 MED ORDER — VALSARTAN-HYDROCHLOROTHIAZIDE 160-25 MG PO TABS: 1.0000 | ORAL_TABLET | Freq: Every day | ORAL | Status: DC

## 2011-07-19 NOTE — Assessment & Plan Note (Addendum)
Acute abdominal pain. Patient has had abdominal distension/constipation for the past several months but this is new and much more severe than her usual presentation.  Last year, she had work-up for abdominal pain including CT scan, which was negative. LFTS were normal at that time as well. Fatty liver disease suspected at that time.  D/Dx: gallbladder disease, fatty liver disease, severe constipation, pancreatitis, obstruction Will check CMET, CBC, lipase, urinalysis.  Will check CT-abdomen with oral but no IV contrast (history of stage 2 renal disease)>>>normal except for 4.5 cm lesion on right kidney. Patient amenable to plan.   UPDATE: With CT findings, elevated Cr (1.96; baseline 1.00-1.10), renal tubular cells/casts in urine, will schedule for renal ultrasound. Will also check urine culture. Discussed results and plan with patient who is amenable. In the meantime, will treat for UTI with Keflex.>>>Patient says she will come in 06/20 for urine study and then start antibiotic after.  Hgb low (10), however, this is consistent with where she has been the fast few months. If she has symptoms of anemia, advised patient to make appointment.

## 2011-07-19 NOTE — Patient Instructions (Addendum)
We will check lab work on you today.  Go to the hospital for a CT scan of your abdomen.  I will call you today with your results. Page me [phone] if I have not called you by 18:00 pm.

## 2011-07-19 NOTE — Progress Notes (Signed)
  Subjective:    Patient ID: [name], female    DOB: October 07, 1965, 46 y.o.   MRN: [id number]  HPI Abdominal pain.  Patient has had abdominal pain for the past few months but it has gotten worse over the past week. It is diffuse but mostly on her right side.  She has constipation and takes prune juice a few times a week which helps with the constipation. Her last BM was 3 days ago. She is passing a lot gas.  She had abdominal pain work-up including CT last year. Negative for gallstone etiology. Fatty liver suspected. Liver tests were normal.    Review of Systems She complains of nausea without vomiting. No food triggers for abdominal pain.  She denies dysuria.  She has headache. No chest pain or difficulty breathing.  No dysuria, frequency, or urgency. She has been out of her blood pressure medication.   Past Medical History, Family History, Social History, Allergies, and Medications reviewed. 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      Objective:   Physical Exam GEN: significant distress, tearful; morbidly obese PSYCH: engaged, appropriate to questions CV: RRR, no m/r/g PULM: NI WOB, CTAB without w/r/r ABD: NABS; distended, tender throughout but especially RUQ with guarding RUQ and epigastrum; no lower abdominal pain  EXT: no edema SKIN: no rash NEURO: intact, uses cane to help walk due to left knee osteoarthritis    Assessment & Plan:

## 2011-07-19 NOTE — Assessment & Plan Note (Signed)
Will re-fill blood pressure medication. Will need appointment after acute issues resolved to follow-up on blood pressure.

## 2011-07-19 NOTE — Addendum Note (Signed)
Addended by: [place], Jessika Rothery on: 07/19/2011 02:36 PM   Modules accepted: Orders

## 2011-07-20 LAB — CBC
MCH: 28.3 pg (ref 26.0–34.0)
MCHC: 32.9 g/dL (ref 30.0–36.0)
MCV: 86 fL (ref 78.0–100.0)
Platelets: 468 10*3/uL — ABNORMAL HIGH (ref 150–400)
RBC: 3.64 MIL/uL — ABNORMAL LOW (ref 3.87–5.11)

## 2011-07-20 LAB — COMPREHENSIVE METABOLIC PANEL
AST: 12 U/L (ref 0–37)
Alkaline Phosphatase: 54 U/L (ref 39–117)
Glucose, Bld: 127 mg/dL — ABNORMAL HIGH (ref 70–99)
Sodium: 137 mEq/L (ref 135–145)
Total Bilirubin: 0.2 mg/dL — ABNORMAL LOW (ref 0.3–1.2)
Total Protein: 6.3 g/dL (ref 6.0–8.3)

## 2011-07-20 NOTE — Addendum Note (Signed)
Addended by: Verdie Drown, Levada Dy J on: 07/20/2011 02:59 PM   Modules accepted: Orders

## 2011-07-20 NOTE — Addendum Note (Signed)
Addended by: Karen Kays J on: 07/20/2011 02:54 PM   Modules accepted: Orders, Level of Service

## 2011-07-21 ENCOUNTER — Telehealth: Payer: Self-pay | Admitting: *Deleted

## 2011-07-21 LAB — CNS IGG SYNTHESIS RATE, CSF+BLOOD
Albumin, CSF: 32.6 mg/dL (ref 8.0–42.0)
IgG Index, CSF: 0.53 (ref <0.66)
IgG, CSF: 4.2 mg/dL (ref 0.8–7.7)
IgG, Serum: 784 mg/dL (ref 694–1618)
MS CNS IgG Synthesis Rate: 0.5 mg/24 h (ref <=3.3)

## 2011-07-21 MED ORDER — CEPHALEXIN 500 MG PO CAPS: 500.0000 mg | ORAL_CAPSULE | Freq: Two times a day (BID) | ORAL | Status: DC

## 2011-07-21 NOTE — Addendum Note (Signed)
Addended by: Karen Kays J on: 07/21/2011 12:33 PM   Modules accepted: Orders

## 2011-07-21 NOTE — Telephone Encounter (Signed)
LMOVM for pt to call back.  Wanted to make sure she was aware of her ultrasound appt at the hospital and also to make a lab appt for a urine culture (order already placed).  When pt calls back please make the appt and confirm ultrasound appt. Haidyn Kilburg, Salome Spotted

## 2011-07-22 ENCOUNTER — Inpatient Hospital Stay (HOSPITAL_COMMUNITY): Payer: Medicaid Other

## 2011-07-22 ENCOUNTER — Encounter (HOSPITAL_COMMUNITY): Payer: Self-pay | Admitting: *Deleted

## 2011-07-22 ENCOUNTER — Inpatient Hospital Stay (HOSPITAL_COMMUNITY)
Admission: EM | Admit: 2011-07-22 | Discharge: 2011-07-25 | DRG: 392 | Disposition: A | Discharge status: Home / Self Care | Payer: Medicaid Other | Attending: Family Medicine | Admitting: Family Medicine

## 2011-07-22 DIAGNOSIS — E860 Dehydration: Secondary | ICD-10-CM | POA: Diagnosis present

## 2011-07-22 DIAGNOSIS — Z794 Long term (current) use of insulin: Secondary | ICD-10-CM

## 2011-07-22 DIAGNOSIS — K2970 Gastritis, unspecified, without bleeding: Secondary | ICD-10-CM | POA: Diagnosis present

## 2011-07-22 DIAGNOSIS — K3184 Gastroparesis: Secondary | ICD-10-CM

## 2011-07-22 DIAGNOSIS — R29898 Other symptoms and signs involving the musculoskeletal system: Secondary | ICD-10-CM

## 2011-07-22 DIAGNOSIS — E11319 Type 2 diabetes mellitus with unspecified diabetic retinopathy without macular edema: Secondary | ICD-10-CM | POA: Diagnosis present

## 2011-07-22 DIAGNOSIS — E1139 Type 2 diabetes mellitus with other diabetic ophthalmic complication: Secondary | ICD-10-CM | POA: Diagnosis present

## 2011-07-22 DIAGNOSIS — N179 Acute kidney failure, unspecified: Secondary | ICD-10-CM | POA: Diagnosis present

## 2011-07-22 DIAGNOSIS — K2990 Gastroduodenitis, unspecified, without bleeding: Secondary | ICD-10-CM | POA: Diagnosis present

## 2011-07-22 DIAGNOSIS — M1710 Unilateral primary osteoarthritis, unspecified knee: Secondary | ICD-10-CM | POA: Diagnosis present

## 2011-07-22 DIAGNOSIS — E785 Hyperlipidemia, unspecified: Secondary | ICD-10-CM

## 2011-07-22 DIAGNOSIS — E118 Type 2 diabetes mellitus with unspecified complications: Secondary | ICD-10-CM

## 2011-07-22 DIAGNOSIS — F17200 Nicotine dependence, unspecified, uncomplicated: Secondary | ICD-10-CM | POA: Diagnosis present

## 2011-07-22 DIAGNOSIS — I129 Hypertensive chronic kidney disease with stage 1 through stage 4 chronic kidney disease, or unspecified chronic kidney disease: Secondary | ICD-10-CM | POA: Diagnosis present

## 2011-07-22 DIAGNOSIS — E669 Obesity, unspecified: Secondary | ICD-10-CM | POA: Diagnosis present

## 2011-07-22 DIAGNOSIS — D638 Anemia in other chronic diseases classified elsewhere: Secondary | ICD-10-CM | POA: Diagnosis present

## 2011-07-22 DIAGNOSIS — G589 Mononeuropathy, unspecified: Secondary | ICD-10-CM | POA: Diagnosis present

## 2011-07-22 DIAGNOSIS — N182 Chronic kidney disease, stage 2 (mild): Secondary | ICD-10-CM | POA: Diagnosis present

## 2011-07-22 HISTORY — DX: Type 2 diabetes mellitus without complications: E11.9

## 2011-07-22 HISTORY — DX: Neuralgia and neuritis, unspecified: M79.2

## 2011-07-22 HISTORY — DX: Pure hypercholesterolemia, unspecified: E78.00

## 2011-07-22 HISTORY — DX: Cardiac murmur, unspecified: R01.1

## 2011-07-22 LAB — DIFFERENTIAL
Basophils Absolute: 0 10*3/uL (ref 0.0–0.1)
Lymphocytes Relative: 27 % (ref 12–46)
Lymphs Abs: 2.1 10*3/uL (ref 0.7–4.0)
Neutrophils Relative %: 65 % (ref 43–77)

## 2011-07-22 LAB — BILIRUBIN, DIRECT: Bilirubin, Direct: <0.1 mg/dL (ref 0.0–0.3)

## 2011-07-22 LAB — URINALYSIS, ROUTINE W REFLEX MICROSCOPIC
Bilirubin Urine: NEGATIVE
Ketones, ur: NEGATIVE mg/dL
Nitrite: NEGATIVE
pH: 5.5 (ref 5.0–8.0)

## 2011-07-22 LAB — COMPREHENSIVE METABOLIC PANEL
ALT: 11 U/L (ref 0–35)
AST: 15 U/L (ref 0–37)
Alkaline Phosphatase: 55 U/L (ref 39–117)
CO2: 21 mEq/L (ref 19–32)
GFR calc Af Amer: 34 mL/min — ABNORMAL LOW (ref >90)
GFR calc non Af Amer: 29 mL/min — ABNORMAL LOW (ref >90)
Glucose, Bld: 181 mg/dL — ABNORMAL HIGH (ref 70–99)
Potassium: 4.6 mEq/L (ref 3.5–5.1)
Sodium: 136 mEq/L (ref 135–145)
Total Protein: 7 g/dL (ref 6.0–8.3)

## 2011-07-22 LAB — GLUCOSE, CAPILLARY: Glucose-Capillary: 142 mg/dL — ABNORMAL HIGH (ref 70–99)

## 2011-07-22 LAB — POCT I-STAT, CHEM 8
Calcium, Ion: 1.03 mmol/L — ABNORMAL LOW (ref 1.12–1.32)
Chloride: 108 mEq/L (ref 96–112)
Creatinine, Ser: 2.2 mg/dL — ABNORMAL HIGH (ref 0.50–1.10)
Glucose, Bld: 199 mg/dL — ABNORMAL HIGH (ref 70–99)
HCT: 33 % — ABNORMAL LOW (ref 36.0–46.0)
Potassium: 4.6 mEq/L (ref 3.5–5.1)

## 2011-07-22 LAB — CBC
Hemoglobin: 9.8 g/dL — ABNORMAL LOW (ref 12.0–15.0)
MCH: 28.2 pg (ref 26.0–34.0)
MCHC: 33.6 g/dL (ref 30.0–36.0)
Platelets: 465 10*3/uL — ABNORMAL HIGH (ref 150–400)
RBC: 3.69 MIL/uL — ABNORMAL LOW (ref 3.87–5.11)
WBC: 7.9 10*3/uL (ref 4.0–10.5)

## 2011-07-22 LAB — LIPID PANEL
Cholesterol: 516 mg/dL — ABNORMAL HIGH (ref 0–200)
Total CHOL/HDL Ratio: 10.5 RATIO
Triglycerides: 758 mg/dL — ABNORMAL HIGH (ref <150)
VLDL: UNDETERMINED mg/dL (ref 0–40)

## 2011-07-22 LAB — URINE MICROSCOPIC-ADD ON

## 2011-07-22 LAB — CARDIAC PANEL(CRET KIN+CKTOT+MB+TROPI): Relative Index: 2.8 — ABNORMAL HIGH (ref 0.0–2.5)

## 2011-07-22 MED ORDER — AMLODIPINE BESYLATE 5 MG PO TABS
5.0000 mg | ORAL_TABLET | Freq: Every day | ORAL | Status: DC
  Administered 2011-07-23 – 2011-07-24 (×3): 5 mg via ORAL
  Filled 2011-07-22 (×4): qty 1

## 2011-07-22 MED ORDER — HEPARIN SODIUM (PORCINE) 5000 UNIT/ML IJ SOLN
5000.0000 [IU] | Freq: Three times a day (TID) | INTRAMUSCULAR | Status: DC
  Administered 2011-07-22 – 2011-07-25 (×8): 5000 [IU] via SUBCUTANEOUS
  Filled 2011-07-22 (×11): qty 1

## 2011-07-22 MED ORDER — ONDANSETRON HCL 4 MG PO TABS: 4.0000 mg | ORAL_TABLET | Freq: Four times a day (QID) | ORAL | Status: DC | PRN

## 2011-07-22 MED ORDER — MORPHINE SULFATE 2 MG/ML IJ SOLN
2.0000 mg | INTRAMUSCULAR | Status: DC | PRN
  Administered 2011-07-22: 2 mg via INTRAVENOUS
  Filled 2011-07-22: qty 1

## 2011-07-22 MED ORDER — POLYETHYLENE GLYCOL 3350 17 G PO PACK
17.0000 g | PACK | Freq: Every day | ORAL | Status: DC | PRN
  Filled 2011-07-22: qty 1

## 2011-07-22 MED ORDER — PNEUMOCOCCAL VAC POLYVALENT 25 MCG/0.5ML IJ INJ
0.5000 mL | INJECTION | INTRAMUSCULAR | Status: AC
  Filled 2011-07-22: qty 0.5

## 2011-07-22 MED ORDER — HYDRALAZINE HCL 20 MG/ML IJ SOLN
10.0000 mg | INTRAMUSCULAR | Status: DC | PRN
  Administered 2011-07-22 – 2011-07-25 (×6): 10 mg via INTRAVENOUS
  Filled 2011-07-22 (×4): qty 0.5

## 2011-07-22 MED ORDER — SODIUM CHLORIDE 0.9 % IV SOLN
INTRAVENOUS | Status: DC
  Administered 2011-07-22 – 2011-07-24 (×3): via INTRAVENOUS

## 2011-07-22 MED ORDER — DULOXETINE HCL 30 MG PO CPEP
30.0000 mg | ORAL_CAPSULE | Freq: Two times a day (BID) | ORAL | Status: DC
  Administered 2011-07-22 – 2011-07-25 (×6): 30 mg via ORAL
  Filled 2011-07-22 (×7): qty 1

## 2011-07-22 MED ORDER — SODIUM CHLORIDE 0.9 % IV BOLUS (SEPSIS)
1000.0000 mL | Freq: Once | INTRAVENOUS | Status: AC
  Administered 2011-07-22: 1000 mL via INTRAVENOUS

## 2011-07-22 MED ORDER — SODIUM CHLORIDE 0.9 % IJ SOLN
3.0000 mL | Freq: Two times a day (BID) | INTRAMUSCULAR | Status: DC
  Administered 2011-07-23 – 2011-07-24 (×2): 3 mL via INTRAVENOUS

## 2011-07-22 MED ORDER — METOPROLOL TARTRATE 50 MG PO TABS
50.0000 mg | ORAL_TABLET | Freq: Two times a day (BID) | ORAL | Status: DC
  Administered 2011-07-22: 50 mg via ORAL
  Filled 2011-07-22 (×3): qty 1

## 2011-07-22 MED ORDER — ONDANSETRON HCL 4 MG/2ML IJ SOLN: 4.0000 mg | Freq: Four times a day (QID) | INTRAMUSCULAR | Status: DC | PRN

## 2011-07-22 MED ORDER — ONDANSETRON HCL 4 MG/2ML IJ SOLN
4.0000 mg | Freq: Once | INTRAMUSCULAR | Status: AC
  Administered 2011-07-22: 4 mg via INTRAVENOUS
  Filled 2011-07-22: qty 2

## 2011-07-22 MED ORDER — GI COCKTAIL ~~LOC~~
30.0000 mL | Freq: Once | ORAL | Status: AC
  Administered 2011-07-22: 30 mL via ORAL
  Filled 2011-07-22: qty 30

## 2011-07-22 MED ORDER — HYDROMORPHONE HCL PF 1 MG/ML IJ SOLN
1.0000 mg | Freq: Once | INTRAMUSCULAR | Status: AC
  Administered 2011-07-22: 1 mg via INTRAVENOUS
  Filled 2011-07-22: qty 1

## 2011-07-22 MED ORDER — INSULIN ASPART 100 UNIT/ML ~~LOC~~ SOLN
0.0000 [IU] | Freq: Three times a day (TID) | SUBCUTANEOUS | Status: DC
  Administered 2011-07-23 (×2): 2 [IU] via SUBCUTANEOUS
  Administered 2011-07-23: 5 [IU] via SUBCUTANEOUS
  Administered 2011-07-24: 2 [IU] via SUBCUTANEOUS
  Administered 2011-07-24: 3 [IU] via SUBCUTANEOUS
  Administered 2011-07-24: 5 [IU] via SUBCUTANEOUS
  Administered 2011-07-25: 2 [IU] via SUBCUTANEOUS

## 2011-07-22 MED ORDER — PREGABALIN 50 MG PO CAPS
50.0000 mg | ORAL_CAPSULE | Freq: Three times a day (TID) | ORAL | Status: DC
  Administered 2011-07-23 – 2011-07-25 (×5): 50 mg via ORAL
  Filled 2011-07-22 (×7): qty 1

## 2011-07-22 MED ORDER — ACETAMINOPHEN 325 MG PO TABS
650.0000 mg | ORAL_TABLET | Freq: Four times a day (QID) | ORAL | Status: DC | PRN
  Administered 2011-07-22 – 2011-07-24 (×4): 650 mg via ORAL
  Filled 2011-07-22 (×4): qty 2

## 2011-07-22 NOTE — H&P (Signed)
[name] is an 46 y.o. female.   Chief Complaint: Abdominal pain/Nausea  HPI:  46 yo female with history of HTN, HLD, T2DM here with complaint of abdominal pain and nausea for the past 3 weeks.  States she has had abdominal pain in her upper abdomen for the past 3 weeks that is worse with eating.  She says anytime she tries to eat or drink anything she gets nauseous.  She describes the pain as "tightness" across her upper abdomen.  She denies associated vomiting or diarrhea, but says she has been constipated for a few days.  She has taken very little PO over the past week, having only a glass of juice this morning and a couple glasses of water over the past couple of days.   Was seen in clinic for the same problem on 6/17 and pcp ordered abdominal CT.  Cr also elevated at that time.  She denies chest pain, shortness of breath, blood in her stool, decreased urine output, dysuria.  Past Medical History  Diagnosis Date  . Diabetes mellitus   . Hypertension   . Pericardial effusion   . Headache   . Arthritis   . Pneumonia   . Anemia   . Renal disorder     Past Surgical History  Procedure Date  . Cardiac surgery 2006    Pericardial window for pericardial effusion  . Tubal ligation     Family History  Problem Relation Age of Onset  . Cancer Brother   . Heart disease Father   . Diabetes Mother   . Diabetes Father   . Diabetes Sister   . Diabetes Brother   . Hyperlipidemia Father   . Hypertension Mother   . Hypertension Father   . Hypertension Sister   . Hypertension Brother   . Stroke Mother   . Stroke Father    Social History:  reports that she has been smoking Cigarettes.  She has a 40.8 pack-year smoking history. She does not have any smokeless tobacco history on file. She reports that she does not drink alcohol or use illicit drugs.  Allergies: No Known Allergies   (Not in a hospital admission)  Results for orders placed during the hospital encounter of 07/22/11  (from the past 48 hour(s))  POCT I-STAT, CHEM 8     Status: Abnormal   Collection Time   07/22/11  1:41 PM      Component Value Range Comment   Sodium 134 (*) 135 - 145 mEq/L    Potassium 4.6  3.5 - 5.1 mEq/L    Chloride 108  96 - 112 mEq/L    BUN 23  6 - 23 mg/dL    Creatinine, Ser 2.20 (*) 0.50 - 1.10 mg/dL    Glucose, Bld 199 (*) 70 - 99 mg/dL    Calcium, Ion 1.03 (*) 1.12 - 1.32 mmol/L    TCO2 21  0 - 100 mmol/L    Hemoglobin 11.2 (*) 12.0 - 15.0 g/dL    HCT 33.0 (*) 36.0 - 46.0 %   URINALYSIS, ROUTINE W REFLEX MICROSCOPIC     Status: Abnormal   Collection Time   07/22/11  3:26 PM      Component Value Range Comment   Color, Urine YELLOW  YELLOW    APPearance CLOUDY (*) CLEAR    Specific Gravity, Urine 1.022  1.005 - 1.030    pH 5.5  5.0 - 8.0    Glucose, UA 100 (*) NEGATIVE mg/dL    Hgb  urine dipstick MODERATE (*) NEGATIVE    Bilirubin Urine NEGATIVE  NEGATIVE    Ketones, ur NEGATIVE  NEGATIVE mg/dL    Protein, ur >300 (*) NEGATIVE mg/dL    Urobilinogen, UA 0.2  0.0 - 1.0 mg/dL    Nitrite NEGATIVE  NEGATIVE    Leukocytes, UA TRACE (*) NEGATIVE   URINE MICROSCOPIC-ADD ON     Status: Abnormal   Collection Time   07/22/11  3:26 PM      Component Value Range Comment   Squamous Epithelial / LPF FEW (*) RARE    WBC, UA 0-2  <3 WBC/hpf    RBC / HPF 3-6  <3 RBC/hpf    Bacteria, UA FEW (*) RARE    Casts HYALINE CASTS (*) NEGATIVE GRANULAR CAST  POCT PREGNANCY, URINE     Status: Normal   Collection Time   07/22/11  3:53 PM      Component Value Range Comment   Preg Test, Ur NEGATIVE  NEGATIVE   CBC     Status: Abnormal   Collection Time   07/22/11  4:33 PM      Component Value Range Comment   WBC 7.9  4.0 - 10.5 K/uL    RBC 3.69 (*) 3.87 - 5.11 MIL/uL    Hemoglobin 10.7 (*) 12.0 - 15.0 g/dL    HCT 31.5 (*) 36.0 - 46.0 %    MCV 85.4  78.0 - 100.0 fL    MCH 29.0  26.0 - 34.0 pg    MCHC 34.0  30.0 - 36.0 g/dL    RDW 14.3  11.5 - 15.5 %    Platelets 465 (*) 150 - 400 K/uL     DIFFERENTIAL     Status: Normal   Collection Time   07/22/11  4:33 PM      Component Value Range Comment   Neutrophils Relative 65  43 - 77 %    Neutro Abs 5.2  1.7 - 7.7 K/uL    Lymphocytes Relative 27  12 - 46 %    Lymphs Abs 2.1  0.7 - 4.0 K/uL    Monocytes Relative 6  3 - 12 %    Monocytes Absolute 0.5  0.1 - 1.0 K/uL    Eosinophils Relative 2  0 - 5 %    Eosinophils Absolute 0.1  0.0 - 0.7 K/uL    Basophils Relative 1  0 - 1 %    Basophils Absolute 0.0  0.0 - 0.1 K/uL   COMPREHENSIVE METABOLIC PANEL     Status: Abnormal   Collection Time   07/22/11  4:33 PM      Component Value Range Comment   Sodium 136  135 - 145 mEq/L    Potassium 4.6  3.5 - 5.1 mEq/L    Chloride 98  96 - 112 mEq/L    CO2 21  19 - 32 mEq/L    Glucose, Bld 181 (*) 70 - 99 mg/dL    BUN 22  6 - 23 mg/dL    Creatinine, Ser 1.96 (*) 0.50 - 1.10 mg/dL    Calcium 9.6  8.4 - 10.5 mg/dL    Total Protein 7.0  6.0 - 8.3 g/dL    Albumin 2.9 (*) 3.5 - 5.2 g/dL    AST 15  0 - 37 U/L    ALT 11  0 - 35 U/L    Alkaline Phosphatase 55  39 - 117 U/L    Total Bilirubin 0.2 (*) 0.3 - 1.2 mg/dL  GFR calc non Af Amer 29 (*) >90 mL/min    GFR calc Af Amer 34 (*) >90 mL/min    Ct Abdomen Pelvis Wo Contrast  07/19/2011  *RADIOLOGY REPORT*  Clinical Data: Epigastric pain with nausea and constipation. History of tubal ligation and C section.  Renal insufficiency.  CT ABDOMEN AND PELVIS WITHOUT CONTRAST  Technique:  Multidetector CT imaging of the abdomen and pelvis was performed following the standard protocol without intravenous contrast.  Comparison: None.  Findings: The lung bases are clear aside from linear scarring or atelectasis in the left lower lobe.  There is no pleural effusion.  As evaluated in the noncontrast state, the liver, gallbladder, biliary system and pancreas appear normal.  The spleen and adrenal glands appear normal.  There is an indeterminate 4.5-cm lesion in the upper pole of the right kidney (image 37).   This measures 21 HU.  There are sub centimeter exophytic lesions projecting from the lower pole of the right kidney on image 52 and upper pole of the left kidney on image 31.  There is no hydronephrosis.  There is diffuse lobularity of the uterus.  There is a large partially calcified degenerated fibroid measuring 6.7 cm on image 78.  No adnexal mass is identified.  The bowel gas pattern is nonobstructive.  The appendix appears normal.  There is a moderate sized infraumbilical hernia containing loops of small bowel and a small amount of fluid.  There is no evidence of incarceration.  No suspicious osseous findings are seen.  IMPRESSION:  1.  Infraumbilical ventral hernia contains small bowel.  There is no evidence of incarceration or obstruction. 2.  Indeterminate 4.5-cm lesion in the upper pole of the right kidney could reflect a complex cyst.  However, this is incompletely characterized on this noncontrast examination.  Renal ultrasound recommended to exclude solid lesion. 3. Uterine fibroids.  Original Report Authenticated By: Vivia Ewing, M.D.   Review of Systems  Constitutional: Negative for fever and chills.  Eyes: Negative for blurred vision.  Respiratory: Negative for cough and shortness of breath.   Cardiovascular: Negative for chest pain, palpitations and leg swelling.  Gastrointestinal: Positive for nausea, abdominal pain and constipation. Negative for heartburn, vomiting, diarrhea and blood in stool.  Genitourinary: Negative for dysuria.  Neurological: Positive for weakness.    Blood pressure 188/98, pulse 76, temperature 98 F (36.7 C), temperature source Oral, resp. rate 16, SpO2 97.00%. Physical Exam  Constitutional: She appears well-developed and well-nourished. No distress.  HENT:       Mucus membranes dry   Neck: Neck supple. No thyromegaly present.  Cardiovascular: Normal rate and regular rhythm.   Murmur (2-3/6 SEM) heard. Respiratory: Effort normal and breath sounds  normal. No respiratory distress.  GI: Soft. Bowel sounds are normal. She exhibits no distension. There is tenderness (Epigastric area and RUQ). There is no rebound and no guarding.  Musculoskeletal: She exhibits no edema.  Neurological: She is alert.     Assessment/Plan 46 yo with multiple medical problems here with abdominal pain and nausea, with poor po intake.  1.  Abdominal pain/Nausea:  Unsure of etiology at this time.  Differential includes gastritis vs gastroparesis vs gallbladder vs pancreatitis vs MI.  Will plan to admit to telemetry bed and obtain abdominal ultrasound to assess gallbladder given her RUQ pain and association with meals.  Had abdominal CT on 6/17 showing hernia but not evidence of strangulation, do not think this is causing her problem.  Lipase is pending and  LFT's normal.  Could be atypical cardiac pain given that she is female and diabetic, will get cardiac enzymes and 12 lead EKG.  Will give GI cocktail to see if this helps alleviate some of her symptoms.  Zofran as needed for nausea control, morphine for pain control.  If pain not improving and work up unrevealing, may need to consider GI consultation.   2.  ARF:  Baseline Cr appears to be around 1.1, now at 1.9.  Likely due to poor PO intake along with ARB use.  Will plan to hydrate and hold diovan at this time.  She did have lesion on upper pole of R kidney on CT scan, thought to be complex cyst.   Hopefully will be able to better characterize with ultrasound.  3.  DM:  Moderate control with previous A1c in April 8.4.  Typically takes metformin and 70/30 insulin at home.  Will hold scheduled 70/30 for now since not taking good PO and will hold metformin given acute renal failure.  Will place on SSI for now.    4. HLD;  Severe HLD with high triglycerides when last checked in December 2012.  Will recheck lipid panel here for risk stratification.  Currently not on statin  5.  ?UTI:  Was given rx for keflex in clinic at  last visit for possible UTI, has not filled this yet.  UA not convincing for UTI, and denies urinary symptoms.  Will send urine for culture and hold off on antibiotics at this time.   6.  HTN:  Takes diovan and metoprolol at home.  Will continue metoprolol at home dose and use hydralazine prn for systolic [id number].  Will hold diovan 2/2 to ARF.  7.  Neuropathy:  Continue lyrica.  Appears to be having outpatient workup done by Dr. Jannifer Franklin for neuropathy and weakness.  No acute changes regarding this.  8.  Anemia:  Normocytic, likely from chronic disease.  Hgb stable, continue to follow  9.  FEN/GI:  Clear liquids for now, NS @ 169mL/hr  10.  PPX: Protonix, heparin  11. Dispo:  Pending improvement.   Kristan Brummitt 07/22/2011, 6:26 PM

## 2011-07-22 NOTE — ED Provider Notes (Signed)
As of epigastric pain and inability to eat for the past 3 days. Also reports constipation the with last bowel movement one week. On exam patient alert nontoxic Glasgow Coma Score 15 abdomen obese tender at epigastrium, right upper quadrants and left upper quadrants. In light of patient's inability to eat, renal insufficiency will consult family practice teaching service for admission  Orlie Dakin, MD 07/22/11 [phone]

## 2011-07-22 NOTE — ED Notes (Signed)
Pt states that she has been having abdominal pain for the past two weeks. Pt states that she hasn't eaten in the past 3 days due to pain after eating across upper abdomen. Pt states unable to go to bathroom for the past couple of days as well.

## 2011-07-22 NOTE — ED Notes (Signed)
CLEAR LIQUID ORDERED SPOKE WITH MONICA

## 2011-07-22 NOTE — ED Notes (Addendum)
Pt. Reports abdominal pain x 2 weeks. "tightness across the top of my stomach and it's sharp pain sometimes". Reports constipation. Last bowel movement 1 week ago. Nausea, no vomiting. Denies chest pain. Reports SOB since this morning. Constant, does not increase with activity. Pt. Reports cyst on her kidney.  A.O. X 4.

## 2011-07-22 NOTE — ED Provider Notes (Signed)
History     CSN: [id number]  Arrival date & time 07/22/11  1208   First MD Initiated Contact with Patient 07/22/11 1502      Chief Complaint  Patient presents with  . Abdominal Pain    Patient is a 46 y.o. female presenting with abdominal pain. The history is provided by the patient.  Abdominal Pain The primary symptoms of the illness include abdominal pain and nausea. The primary symptoms of the illness do not include fever, shortness of breath, vomiting, diarrhea or dysuria. The current episode started more than 2 days ago (2 weeks ago). The onset of the illness was gradual. The problem has been gradually worsening.  Nausea began more than 1 week ago (2 weeks ago). The nausea is associated with eating. The nausea is exacerbated by food.  The patient states that she believes she is currently not pregnant. The patient has had a change in bowel habit (constipated; no BM's for 3 days]). Additional symptoms associated with the illness include constipation. Symptoms associated with the illness do not include chills, anorexia, diaphoresis, heartburn, urgency, hematuria, frequency or back pain. Significant associated medical issues include diabetes.    Past Medical History  Diagnosis Date  . Diabetes mellitus   . Hypertension   . Pericardial effusion   . Headache   . Arthritis   . Pneumonia   . Anemia     Past Surgical History  Procedure Date  . Cardiac surgery 2006    Pericardial window for pericardial effusion  . Tubal ligation     Family History  Problem Relation Age of Onset  . Cancer Brother   . Heart disease Father   . Diabetes Mother   . Diabetes Father   . Diabetes Sister   . Diabetes Brother   . Hyperlipidemia Father   . Hypertension Mother   . Hypertension Father   . Hypertension Sister   . Hypertension Brother   . Stroke Mother   . Stroke Father     History  Substance Use Topics  . Smoking status: Current Everyday Smoker -- 3.4 packs/day for 12 years   Types: Cigarettes  . Smokeless tobacco: Not on file  . Alcohol Use: No    OB History    Grav Para Term Preterm Abortions TAB SAB Ect Mult Living                  Review of Systems  Constitutional: Negative for fever, chills, diaphoresis, activity change and appetite change.  HENT: Negative for neck pain and neck stiffness.   Respiratory: Negative for cough, chest tightness, shortness of breath and wheezing.   Cardiovascular: Negative for chest pain and palpitations.  Gastrointestinal: Positive for nausea, abdominal pain and constipation. Negative for heartburn, vomiting, diarrhea and anorexia.  Genitourinary: Negative for dysuria, urgency, frequency, hematuria, decreased urine volume and difficulty urinating.  Musculoskeletal: Negative for back pain.  Skin: Negative for rash and wound.  Neurological: Negative for seizures, syncope, facial asymmetry and light-headedness.  Psychiatric/Behavioral: Negative for confusion and agitation.  All other systems reviewed and are negative.    Allergies  Review of patient's allergies indicates no known allergies.  Home Medications   Current Outpatient Rx  Name Route Sig Dispense Refill  . DULOXETINE HCL 30 MG PO CPEP Oral Take 30 mg by mouth 2 (two) times daily.    . INSULIN ASPART PROT & ASPART (70-30) 100 UNIT/ML [place] SUSP Subcutaneous Inject 25-30 Units into the skin 2 (two) times daily with a meal.  Inject 25 units every morning and inject 30 units every night at bedtime.    . INSULIN ASPART PROT & ASPART (70-30) 100 UNIT/ML [place] SUSP Subcutaneous Inject 25 Units into the skin 2 (two) times daily with a meal. 3 mL 12  . METFORMIN HCL 1000 MG PO TABS Oral Take 1 tablet (1,000 mg total) by mouth 2 (two) times daily with a meal. 180 tablet 0  . METOPROLOL TARTRATE 50 MG PO TABS Oral Take 1 tablet (50 mg total) by mouth 2 (two) times daily. 60 tablet 3  . VALSARTAN-HYDROCHLOROTHIAZIDE 160-25 MG PO TABS Oral Take 1 tablet by mouth daily. 30 tablet  0  . CEPHALEXIN 500 MG PO CAPS Oral Take 1 capsule (500 mg total) by mouth 2 (two) times daily. 20 capsule 0  . PREGABALIN 50 MG PO CAPS Oral Take 1 capsule (50 mg total) by mouth 3 (three) times daily. May increase and take up to 2 tablets three times a day as needed for neuropathic pain. 180 capsule 1    BP 175/94  Pulse 90  Temp 98.4 F (36.9 C) (Oral)  Resp 18  SpO2 99%  Physical Exam  Nursing note and vitals reviewed. Constitutional: She is oriented to person, place, and time. She appears well-developed and well-nourished.       Overweight   HENT:  Head: Normocephalic and atraumatic.  Right Ear: External ear normal.  Left Ear: External ear normal.  Nose: Nose normal.  Mouth/Throat: Oropharynx is clear and moist. No oropharyngeal exudate.  Eyes: Conjunctivae are normal.  Neck: Normal range of motion.  Cardiovascular: Normal rate, regular rhythm, normal heart sounds and intact distal pulses.  Exam reveals no gallop and no friction rub.   No murmur heard. Pulmonary/Chest: Effort normal and breath sounds normal. No respiratory distress. She has no wheezes. She has no rales. She exhibits no tenderness.  Abdominal: Soft. Bowel sounds are normal. She exhibits no distension and no mass. There is tenderness (moderate TTP localized to epigastrum and RUQ; Negative Murphy's Sign). There is no rebound and no guarding.  Musculoskeletal: Normal range of motion. She exhibits no edema and no tenderness.  Neurological: She is alert and oriented to person, place, and time.  Skin: Skin is warm and dry.  Psychiatric: She has a normal mood and affect. Her behavior is normal. Judgment and thought content normal.    ED Course  Procedures (including critical care time)  Labs Reviewed  CBC - Abnormal; Notable for the following:    RBC 3.69 (*)     Hemoglobin 10.7 (*)     HCT 31.5 (*)     Platelets 465 (*)     All other components within normal limits  COMPREHENSIVE METABOLIC PANEL - Abnormal;  Notable for the following:    Glucose, Bld 181 (*)     Creatinine, Ser 1.96 (*)     Albumin 2.9 (*)     Total Bilirubin 0.2 (*)     GFR calc non Af Amer 29 (*)     GFR calc Af Amer 34 (*)     All other components within normal limits  URINALYSIS, ROUTINE W REFLEX MICROSCOPIC - Abnormal; Notable for the following:    APPearance CLOUDY (*)     Glucose, UA 100 (*)     Hgb urine dipstick MODERATE (*)     Protein, ur >300 (*)     Leukocytes, UA TRACE (*)     All other components within normal limits  POCT I-STAT,  CHEM 8 - Abnormal; Notable for the following:    Sodium 134 (*)     Creatinine, Ser 2.20 (*)     Glucose, Bld 199 (*)     Calcium, Ion 1.03 (*)     Hemoglobin 11.2 (*)     HCT 33.0 (*)     All other components within normal limits  URINE MICROSCOPIC-ADD ON - Abnormal; Notable for the following:    Squamous Epithelial / LPF FEW (*)     Bacteria, UA FEW (*)     Casts HYALINE CASTS (*)  GRANULAR CAST   All other components within normal limits  CARDIAC PANEL(CRET KIN+CKTOT+MB+TROPI) - Abnormal; Notable for the following:    CK, MB 4.3 (*)     Relative Index 2.8 (*)     All other components within normal limits  LIPID PANEL - Abnormal; Notable for the following:    Cholesterol 516 (*)     Triglycerides 758 (*)     All other components within normal limits  CBC - Abnormal; Notable for the following:    RBC 3.47 (*)     Hemoglobin 9.8 (*)     HCT 29.2 (*)     All other components within normal limits  GLUCOSE, CAPILLARY - Abnormal; Notable for the following:    Glucose-Capillary 142 (*)     All other components within normal limits  DIFFERENTIAL  POCT PREGNANCY, URINE  BILIRUBIN, DIRECT  LIPASE, BLOOD  CARDIAC PANEL(CRET KIN+CKTOT+MB+TROPI)  URINE CULTURE  OCCULT BLOOD X 1 CARD TO LAB, STOOL  BASIC METABOLIC PANEL  CBC   US Abdomen Complete  07/22/2011  *RADIOLOGY REPORT*  Clinical Data:  Abdominal pain and nausea and vomiting.  COMPLETE ABDOMINAL ULTRASOUND   Comparison:  CT scan dated 07/19/2011  Findings:  Gallbladder:  No gallstones, gallbladder wall thickening, or pericholecystic fluid. Negative sonographic Murphy's sign.  Common bile duct:  Normal.  6 mm in diameter.  Liver:  Diffuse increased echogenicity of the liver parenchyma suggesting hepatic steatosis.  IVC:  Appears normal.  Pancreas:  Normal.  Spleen:  Normal.  9.6 cm in length.  Right Kidney:  15.9 cm in length.  Echogenic renal parenchyma.  4.7 cm complex cyst in the upper pole.  This probably represents a hemorrhagic cyst.  Left Kidney:  14.0 cm in length.  Echogenic renal parenchyma.  Abdominal aorta:  Normal.  IMPRESSION: Echogenic renal parenchyma bilaterally consistent with renal medical disease.  Increased echogenicity of the liver parenchyma suggests hepatic steatosis.  Slightly complex well defined cystic lesion in the upper pole of the right kidney, probably a proteinaceous or hemorrhagic cyst. It has minimally increased in size since the CT scan dated 08/18/2010.  Original Report Authenticated By: Larey Seat, M.D.     1. Right leg weakness     MDM  46 yo F presents with continued epigastric abdominal pain for 2 weeks that has progressively worsened, with associated anorexia and constipation. Patient was seen for the same symptoms 3 days ago and found to be in acute renal failure and have a right renal cyst. Patient is scheduled for follow-up ultrasound of the cyst. Patient has history of hypertension (takes Metoprolol, Valsartan/HCTZ); however, her blood pressure is elevated from baseline here. TTP localized to epigastrum and RUQ on exam. Negative Murphy's Sign. Labs consistent with acute renal failure, likely secondary to dehydration in this patient who has barely had anything to eat or drink the past 3 days. IVF provided. Will admit to [facility].  Marco Collie, MD 07/23/11 0110

## 2011-07-23 LAB — BASIC METABOLIC PANEL
Calcium: 9 mg/dL (ref 8.4–10.5)
Creatinine, Ser: 1.81 mg/dL — ABNORMAL HIGH (ref 0.50–1.10)
GFR calc non Af Amer: 32 mL/min — ABNORMAL LOW (ref >90)
Glucose, Bld: 148 mg/dL — ABNORMAL HIGH (ref 70–99)
Sodium: 138 mEq/L (ref 135–145)

## 2011-07-23 LAB — GLUCOSE, CAPILLARY
Glucose-Capillary: 149 mg/dL — ABNORMAL HIGH (ref 70–99)
Glucose-Capillary: 212 mg/dL — ABNORMAL HIGH (ref 70–99)
Glucose-Capillary: 149 mg/dL — ABNORMAL HIGH (ref 70–99)

## 2011-07-23 LAB — CBC
MCH: 28.5 pg (ref 26.0–34.0)
MCHC: 33.2 g/dL (ref 30.0–36.0)
Platelets: 445 10*3/uL — ABNORMAL HIGH (ref 150–400)
RBC: 3.51 MIL/uL — ABNORMAL LOW (ref 3.87–5.11)
RDW: 14.5 % (ref 11.5–15.5)

## 2011-07-23 LAB — CARDIAC PANEL(CRET KIN+CKTOT+MB+TROPI)
Relative Index: 2.6 — ABNORMAL HIGH (ref 0.0–2.5)
Total CK: 159 U/L (ref 7–177)
Troponin I: <0.3 ng/mL (ref <0.30)

## 2011-07-23 LAB — LDL CHOLESTEROL, DIRECT: Direct LDL: 208 mg/dL — ABNORMAL HIGH

## 2011-07-23 LAB — URINE CULTURE: Colony Count: NO GROWTH

## 2011-07-23 MED ORDER — POLYETHYLENE GLYCOL 3350 17 G PO PACK
17.0000 g | PACK | Freq: Two times a day (BID) | ORAL | Status: DC
  Administered 2011-07-23 – 2011-07-25 (×5): 17 g via ORAL
  Filled 2011-07-23 (×6): qty 1

## 2011-07-23 MED ORDER — METOCLOPRAMIDE HCL 5 MG PO TABS
5.0000 mg | ORAL_TABLET | Freq: Three times a day (TID) | ORAL | Status: DC
  Administered 2011-07-23 – 2011-07-25 (×6): 5 mg via ORAL
  Filled 2011-07-23 (×8): qty 1

## 2011-07-23 MED ORDER — PANTOPRAZOLE SODIUM 40 MG PO TBEC
40.0000 mg | DELAYED_RELEASE_TABLET | Freq: Every day | ORAL | Status: DC
  Administered 2011-07-23 – 2011-07-24 (×2): 40 mg via ORAL
  Filled 2011-07-23 (×2): qty 1

## 2011-07-23 MED ORDER — METOPROLOL TARTRATE 100 MG PO TABS
100.0000 mg | ORAL_TABLET | Freq: Two times a day (BID) | ORAL | Status: DC
  Administered 2011-07-23 – 2011-07-25 (×5): 100 mg via ORAL
  Filled 2011-07-23 (×6): qty 1

## 2011-07-23 MED ORDER — SIMVASTATIN 20 MG PO TABS
20.0000 mg | ORAL_TABLET | Freq: Every day | ORAL | Status: DC
  Administered 2011-07-23 – 2011-07-25 (×3): 20 mg via ORAL
  Filled 2011-07-23 (×3): qty 1

## 2011-07-23 MED ORDER — SENNA 8.6 MG PO TABS
1.0000 | ORAL_TABLET | Freq: Every day | ORAL | Status: DC
  Administered 2011-07-23 – 2011-07-25 (×3): 8.6 mg via ORAL
  Filled 2011-07-23 (×3): qty 1

## 2011-07-23 NOTE — H&P (Signed)
Family Medicine Teaching Service Attending Note  I interviewed and examined patient [name] and reviewed their tests and x-rays.  I discussed with Dr. Zigmund Daniel and reviewed their note for today.  I agree with their assessment and plan.     Additionally  This AM feels better no abdomen pain but thinks it might start when she eats breakfast  Abdomen Pain - seems more related to gastric irritation vs gastroparesis.  If can take enough oral to maintain hydration and pain is tolerable likely needs outpatient GI evaluation.  Treat with PPI and consider reglan  ARF - continue hydration - currently IV at 100 cc per hour consider increasing.  Follow Crt.  Has good urop

## 2011-07-23 NOTE — Care Management Note (Unsigned)
    Page 1 of 1   07/23/2011     1:49:00 PM   CARE MANAGEMENT NOTE 07/23/2011  Patient:  GWIN, PANGILINAN   Account Number:  [id number]  Date Initiated:  07/23/2011  Documentation initiated by:  SIMMONS,Alistair Senft  Subjective/Objective Assessment:   ADMITTED WITH ABD PAIN; LIVES AT [place]; USES [facility] FOR AMBULATION; USES [facility] ON [address] FOR RX.     Action/Plan:   DISCHARGE PLANNING INITIATED.   Anticipated DC Date:  07/23/2011   Anticipated DC Plan:  [place]  CM consult      Choice offered to / List presented to:             Status of service:  In process, will continue to follow Medicare Important Message given?   (If response is "NO", the following Medicare IM given date fields will be blank) Date Medicare IM given:   Date Additional Medicare IM given:    Discharge Disposition:    Per UR Regulation:  Reviewed for med. necessity/level of care/duration of stay  If discussed at Long Length of Stay Meetings, dates discussed:    Comments:  07/23/11  [place] RN, BSN [phone] NCM [place].

## 2011-07-23 NOTE — Progress Notes (Signed)
Patient arrived to the floor this evening from the ED with BP 192/88.  Hydralazine had just been administered as a prn IV.  Re-took BP again throughout the shift so far and systolic remained in [id number].  Notified the physician on-call and received new order to add Norvasc to medication regimen and po Metoprolol was changed to  50 mg BID.  Another 10 mg IV Hydralazine was given at 2302.  Will continue to monitor.  Randell Patient

## 2011-07-23 NOTE — Consult Note (Signed)
Pt smokes 1/2-2/3 ppd and she wants help with quitting. She quit once before for 4 months. She is interested in quitting and in action stage. Recommended 21 mg patch x 6 weeks, 14 mg patch x 2 weeks and 7 mg patch x 2 weeks. Discussed patch use instructions and how to taper. Pt voices understanding. Referred to 1-800 quit now for f/u and support. Discussed oral fixation substitutes, second hand smoke and in home smoking policy. Reviewed and gave pt Written education/contact information.

## 2011-07-23 NOTE — Progress Notes (Signed)
[facility] Progress Note  Patient name: [name] Medical record number: [id number] Date of birth: 1965-10-09 Age: 46 y.o. Gender: female    LOS: 1 day   Primary Care Provider: [facility], Levada Dy, MD  Overnight Events:  Doing well. NAEO. Still w/ some pain and nausea after meals. Improved w/ pain regimen and GI cocktail.   Objective: Vital signs in last 24 hours: Temp:  [97.6 F (36.4 C)-98.4 F (36.9 C)] 97.9 F (36.6 C) (06/21 0433) Pulse Rate:  [76-90] 78  (06/21 0433) Resp:  [11-20] 18  (06/21 0433) BP: (166-212)/(88-108) 173/93 mmHg (06/21 0644) SpO2:  [95 %-100 %] 95 % (06/21 0433) Weight:  [225 lb 15.5 oz (102.5 kg)] 225 lb 15.5 oz (102.5 kg) (06/21 0433)  Wt Readings from Last 3 Encounters:  07/23/11 225 lb 15.5 oz (102.5 kg)  07/19/11 233 lb (105.688 kg)  06/08/11 232 lb (105.235 kg)     Current Facility-Administered Medications  Medication Dose Route Frequency Provider Last Rate Last Dose  . 0.9 %  sodium chloride infusion   Intravenous Continuous Luetta Nutting, DO 100 mL/hr at 07/22/11 2327    . acetaminophen (TYLENOL) tablet 650 mg  650 mg Oral Q6H PRN Luetta Nutting, DO   650 mg at 07/22/11 2324  . amLODipine (NORVASC) tablet 5 mg  5 mg Oral Daily Luetta Nutting, DO   5 mg at 07/23/11 0028  . DULoxetine (CYMBALTA) DR capsule 30 mg  30 mg Oral BID Luetta Nutting, DO   30 mg at 07/22/11 2248  . gi cocktail (Maalox,Lidocaine,Donnatal)  30 mL Oral Once Luetta Nutting, DO   30 mL at 07/22/11 2249  . heparin injection 5,000 Units  5,000 Units Subcutaneous Q8H Luetta Nutting, DO   5,000 Units at 07/22/11 2251  . hydrALAZINE (APRESOLINE) injection 10 mg  10 mg Intravenous Q4H PRN Luetta Nutting, DO   10 mg at 07/23/11 0513  . HYDROmorphone (DILAUDID) injection 1 mg  1 mg Intravenous Once Marco Collie, MD   1 mg at 07/22/11 1609  . insulin aspart (novoLOG) injection 0-15 Units  0-15 Units Subcutaneous TID WC Luetta Nutting, DO      . metoprolol  tartrate (LOPRESSOR) tablet 50 mg  50 mg Oral BID Luetta Nutting, DO   50 mg at 07/22/11 2248  . morphine 2 MG/ML injection 2 mg  2 mg Intravenous Q2H PRN Luetta Nutting, DO   2 mg at 07/22/11 2042  . ondansetron (ZOFRAN) injection 4 mg  4 mg Intravenous Once Marco Collie, MD   4 mg at 07/22/11 1609  . ondansetron (ZOFRAN) tablet 4 mg  4 mg Oral Q6H PRN Luetta Nutting, DO       Or  . ondansetron (ZOFRAN) injection 4 mg  4 mg Intravenous Q6H PRN Luetta Nutting, DO      . pantoprazole (PROTONIX) EC tablet 40 mg  40 mg Oral Q1200 Luetta Nutting, DO      . pneumococcal 23 valent vaccine (PNU-IMMUNE) injection 0.5 mL  0.5 mL Intramuscular Tomorrow-1000 Lind Covert, MD      . polyethylene glycol (MIRALAX / GLYCOLAX) packet 17 g  17 g Oral Daily PRN Luetta Nutting, DO      . pregabalin (LYRICA) capsule 50 mg  50 mg Oral TID Luetta Nutting, DO      . sodium chloride 0.9 % bolus 1,000 mL  1,000 mL Intravenous Once Marco Collie, MD   1,000 mL at 07/22/11 1604  . sodium chloride 0.9 %  injection 3 mL  3 mL Intravenous Q12H Luetta Nutting, DO         PE: Gen: NAD HEENT: mmm CV: RRR Res: Normal Effort Abd: mild pain on palpation Ext/Musc:2+ pulses Neuro: CN grossly intact  Labs/Studies:   Lab 07/23/11 0350 07/22/11 1633 07/22/11 1341 07/19/11 1201  NA 138 136 134* --  K 3.9 4.6 4.6 --  CL 103 98 108 --  CO2 22 21 -- 21  BUN 21 22 23  --  CREATININE 1.81* 1.96* 2.20* --  LABGLOM -- -- -- --  GLUCOSE 148* -- -- --  CALCIUM 9.0 9.6 -- 9.2     Lab 07/23/11 0350 07/22/11 2035 07/22/11 1633  WBC 8.1 7.0 7.9  HGB 10.0* 9.8* 10.7*  HCT 30.1* 29.2* 31.5*  PLT 445* 379 465*    Cardiac Panel (last 3 results)  Basename 07/23/11 0350 07/22/11 2035  CKTOTAL 159 156  CKMB 4.2* 4.3*  TROPONINI <0.30 <0.30  RELINDX 2.6* 2.8*    Lipid Panel     Component Value Date/Time   CHOL 516* 07/22/2011 2035   TRIG 758* 07/22/2011 2035   HDL 49 07/22/2011 2035   CHOLHDL 10.5 07/22/2011 2035   VLDL  UNABLE TO CALCULATE IF TRIGLYCERIDE OVER 400 mg/dL 07/22/2011 2035   LDLCALC UNABLE TO CALCULATE IF TRIGLYCERIDE OVER 400 mg/dL 07/22/2011 2035        Assessment/Plan: 46 yo with multiple medical problems here with abdominal pain and nausea, with poor po intake.   Abdominal pain/Nausea: Unsure of etiology at this time. Differential includes gastritis vs gastroparesis vs Constipation. GI cocktail w/ some improvement. Zofran and morphine helping. Pt reports no BM for 1 wk.  - Continue GI cocktail, Zofran, and tylenol - Will DC morphine, as used only once at 2000 yesterday - Will make Miralax and Senna scheduled  ARF: Baseline Cr appears to be around 1.1, now at 1.81, which is improved from 1.9 on admission. Likely improving w/ hydration.  - Continue IVF  ID: Urine Cultures pending  DM: Moderate control with previous A1c in April 8.4. CBG in 140s during admission.  - Continue SSI for now.   HLD: Lipid panel as above. Not on statin. - Starting Simvastatin - Direct LDL today  6. HTN: Elevated BP since admission. Likely from pain and from holding ARB. Continue Hydralazine for SBP >170. - Increase Metoprolol to 100mg  BID - Continue Hydralazine PRN - Continue Amlodipine 5mg  Qday  7. Neuropathy: Continue lyrica. Appears to be having outpatient workup done by Dr. Jannifer Franklin for neuropathy and weakness. No acute changes regarding this.   8. Anemia: Normocytic, likely from chronic disease. Hgb stable, continue to follow   9. FEN/GI: tolerating clear liquids. No BM since 1 wk ago.  - Continue IVF @ 131mL/hr  - ADAT  10. PPX: Protonix, heparin   11. Dispo: Pending improvement.  LOS 1  Signed: Linna Darner, MD Family Medicine Resident PGY-1 [phone] 07/23/2011 7:55 AM

## 2011-07-23 NOTE — ED Provider Notes (Signed)
I have personally seen and examined the patient.  I have discussed the plan of care with the resident.  I have reviewed the documentation on PMH/FH/Soc. History.  I have reviewed the documentation of the resident and agree.  Orlie Dakin, MD 07/23/11 [phone]

## 2011-07-23 NOTE — Progress Notes (Signed)
Pt had a 4-beat run of V-Tach at [place], also had a 3-beat run earlier at [place].  Both episodes asymptomatic.  Will continue to monitor.  [name]

## 2011-07-24 LAB — GLUCOSE, CAPILLARY
Glucose-Capillary: 143 mg/dL — ABNORMAL HIGH (ref 70–99)
Glucose-Capillary: 168 mg/dL — ABNORMAL HIGH (ref 70–99)
Glucose-Capillary: 244 mg/dL — ABNORMAL HIGH (ref 70–99)
Glucose-Capillary: 184 mg/dL — ABNORMAL HIGH (ref 70–99)

## 2011-07-24 LAB — BASIC METABOLIC PANEL
BUN: 18 mg/dL (ref 6–23)
Chloride: 103 mEq/L (ref 96–112)
GFR calc non Af Amer: 32 mL/min — ABNORMAL LOW (ref >90)
Glucose, Bld: 149 mg/dL — ABNORMAL HIGH (ref 70–99)
Potassium: 3.8 mEq/L (ref 3.5–5.1)
Sodium: 137 mEq/L (ref 135–145)

## 2011-07-24 NOTE — Progress Notes (Signed)
Family Medicine Teaching Service  Daily Progress Note  Subjective: Interval History: has complaints of headache this AM. She reports that her epigastric pain has improved. She desires to try to eat. She has not had a bowel movement since admission.   Objective: Vital signs in last 24 hours: Temp:  [97.7 F (36.5 C)-99.1 F (37.3 C)] 99.1 F (37.3 C) (06/22 0402) Pulse Rate:  [70-85] 81  (06/22 0758) Resp:  [18-20] 20  (06/22 0500) BP: (167-192)/(85-98) 174/85 mmHg (06/22 0758) SpO2:  [95 %-99 %] 95 % (06/22 0402) Weight:  [225 lb 14.4 oz (102.468 kg)] 225 lb 14.4 oz (102.468 kg) (06/22 0402)  Intake/Output from previous day: 06/21 0701 - 06/22 0700 In: 2160 [P.O.:960; I.V.:1200] Out: 2100 [Urine:2100]  General appearance: alert, cooperative and mild distress exhibited by holding ice pack to head.  Lungs: clear to auscultation bilaterally Heart: regular rate and rhythm, S1, S2 normal, no murmur, click, rub or gallop Abdomen: obese, NABS, small palpable left lower adominal mass appreciated , discomfort with epigastric palpation without rebound or guarding. Extremities: extremities normal, atraumatic, no cyanosis or edema Neurologic: Grossly normal  Pertinent Labs:  Hemoglobin A1c 8.4 (05/12/11) Cr 2.2 > 1.96> 1.81> 1.82 CBG (last 3)   Basename 07/24/11 0613 07/23/11 2138 07/23/11 1631  GLUCAP 143* 112* 149*  Lipid Panel  CHOL 516,  Direct LDL 208  HDL 49  TRIG 758   Micro: Urine Culture: no growth.   Studies/Results: 07/19/11: Ct Abdomen Pelvis Wo Contrast  IMPRESSION: 1.  Infraumbilical ventral hernia contains small bowel.  There is no evidence of incarceration or obstruction. 2.  Indeterminate 4.5-cm lesion in the upper pole of the right kidney could reflect a complex cyst.  However, this is incompletely characterized on this noncontrast examination.  Renal ultrasound recommended to exclude solid lesion. 3. Uterine fibroids.  07/22/2011 US Abdomen Complete  IMPRESSION:  Echogenic renal parenchyma bilaterally consistent with renal medical disease.  Increased echogenicity of the liver parenchyma suggests hepatic steatosis.  Slightly complex well defined cystic lesion in the upper pole of the right kidney, probably a proteinaceous or hemorrhagic cyst. It has minimally increased in size since the CT scan dated 08/18/2010.    Medications:  Scheduled Meds:    . amLODipine  5 mg Oral Daily  . DULoxetine  30 mg Oral BID  . heparin  5,000 Units Subcutaneous Q8H  . insulin aspart  0-15 Units Subcutaneous TID WC  . metoCLOPramide  5 mg Oral TID AC  . metoprolol  100 mg Oral BID  . pantoprazole  40 mg Oral Q1200  . pneumococcal 23 valent vaccine  0.5 mL Intramuscular Tomorrow-1000  . polyethylene glycol  17 g Oral BID  . pregabalin  50 mg Oral TID  . senna  1 tablet Oral Daily  . simvastatin  20 mg Oral Daily  . sodium chloride  3 mL Intravenous Q12H   Continuous Infusions:   . sodium chloride 100 mL/hr at 07/24/11 0049   PRN Meds:acetaminophen, hydrALAZINE, ondansetron (ZOFRAN) IV, ondansetron, DISCONTD:  morphine injection, DISCONTD: polyethylene glycol  Narcotic use (past 24 hrs): none.   Assessment/Plan:  46 yo with hypertension, diabetes and hyperlipidemia presents with abdominal pain and nausea and subsequent  poor po intake.   1. Abdominal pain/Nausea:  A: Improving. Differentials includes gastritis vs gastroparesis vs Constipation vs. Compression from uterine fibroid.  The uterine fibroid is relatively small and stable compared to abdominal CT done on 08/18/10 so compression from this is unlikely. P: -continue PPI,  zofran and reglan to treat both gastritis and possible gastroparesis.  -improved BP control  -continue senna and advance diet to help with constipation. Plan to add 1/2 dose miralax tomorrow AM if patient tolerating orals, and has not yet had a BM.    2. ARF:  A: improving. Second ay to dehydration and hypertension. Baseline Cr appears  to be around 1.1, now at 1.82, which is improved from 1.9 on admission. Improving w/ hydration, BP control and hold ARB.   P:  -D/C IVF as it may be contributing to elevated BP (high sodium). Patient advised to drink water liberally and understands the importance of this.  Will restart IVF with 1/2 NS if Cr bumps. -Continue to hold ARB and metformin. -Continue Norvasc and metoprolol for BP control .  -Check Cr in AM.   3. HTN:  A: Symptomatic HTN with headache and evidence acute kidney injury.  P: Continue Norvasc and metoprolol for BP control .  Continue hydralazine prn SBP > 180.   4. DM: Moderate control with previous A1c in April 8.4. CBGs fairly well controlled with slightly elevated fasting glucose this AM.  P: Continue SSI for now.   5. HLD: Lipid panel as above.  P: Continue simvastatin   6. Neuropathy: Continue lyrica. Appears to be having outpatient workup done by Dr. Jannifer Franklin for neuropathy and weakness. No acute changes regarding this.   7. Anemia: Normocytic, likely from chronic disease. Hgb stable, continue to follow   8.  FEN/GI: tolerating clear liquids. No BM since 1 wk ago.  - saline lock IV -advance diet to soft, then low sodium regular as tolerated.    9. DVT PPX: heparin.   10. Dispo: To home pending  continued clinical improvement.   Signed:  [place] Resident PGY-2  [phone]  07/23/2011  10:38  AM   LOS: 2 days

## 2011-07-25 LAB — GLUCOSE, CAPILLARY
Glucose-Capillary: 146 mg/dL — ABNORMAL HIGH (ref 70–99)
Glucose-Capillary: 214 mg/dL — ABNORMAL HIGH (ref 70–99)

## 2011-07-25 LAB — BASIC METABOLIC PANEL
BUN: 22 mg/dL (ref 6–23)
CO2: 19 mEq/L (ref 19–32)
Calcium: 9 mg/dL (ref 8.4–10.5)
Chloride: 104 mEq/L (ref 96–112)
Creatinine, Ser: 2.05 mg/dL — ABNORMAL HIGH (ref 0.50–1.10)
GFR calc Af Amer: 32 mL/min — ABNORMAL LOW (ref >90)

## 2011-07-25 MED ORDER — AMLODIPINE BESYLATE 10 MG PO TABS
10.0000 mg | ORAL_TABLET | Freq: Every day | ORAL | Status: DC
  Administered 2011-07-25: 10 mg via ORAL

## 2011-07-25 MED ORDER — AMLODIPINE BESYLATE 10 MG PO TABS: 10.0000 mg | ORAL_TABLET | Freq: Every day | ORAL | Status: AC

## 2011-07-25 MED ORDER — SIMVASTATIN 20 MG PO TABS: 20.0000 mg | ORAL_TABLET | Freq: Every day | ORAL | Status: DC

## 2011-07-25 MED ORDER — METOPROLOL TARTRATE 100 MG PO TABS: 100.0000 mg | ORAL_TABLET | Freq: Two times a day (BID) | ORAL | Status: DC

## 2011-07-25 MED ORDER — ONDANSETRON HCL 4 MG PO TABS: 4.0000 mg | ORAL_TABLET | Freq: Four times a day (QID) | ORAL | Status: AC | PRN

## 2011-07-25 MED ORDER — METOCLOPRAMIDE HCL 5 MG PO TABS: 5.0000 mg | ORAL_TABLET | Freq: Three times a day (TID) | ORAL | Status: DC

## 2011-07-25 NOTE — Progress Notes (Signed)
FMTS Attending Daily Note: An Lannan MD [phone] pager office [phone] I have discussed this patient with the resident and reviewed the assessment and plan as documented above. I agree wit the resident's findings and plan.  

## 2011-07-25 NOTE — Progress Notes (Signed)
[facility] Progress Note  Patient name: [name] Medical record number: [id number] Date of birth: 08/28/1965 Age: 46 y.o. Gender: female    LOS: 3 days   Primary Care Provider: [facility] PARK, ANGELA, MD  Overnight Events:  NAEO. HA resolved. ABdominal pain resolved. Pt w/ BM overnight. Tolerating diet w/o pain or nausea. Asking to go home today.   Objective: Vital signs in last 24 hours: Temp:  [98.1 F (36.7 C)-98.9 F (37.2 C)] 98.9 F (37.2 C) (06/23 0453) Pulse Rate:  [66-77] 77  (06/23 0453) Resp:  [18-21] 18  (06/23 0453) BP: (148-175)/(78-93) 175/93 mmHg (06/23 0453) SpO2:  [96 %-98 %] 96 % (06/23 0453)  Wt Readings from Last 3 Encounters:  07/24/11 225 lb 14.4 oz (102.468 kg)  07/19/11 233 lb (105.688 kg)  06/08/11 232 lb (105.235 kg)     Current Facility-Administered Medications  Medication Dose Route Frequency Provider Last Rate Last Dose  . acetaminophen (TYLENOL) tablet 650 mg  650 mg Oral Q6H PRN Luetta Nutting, DO   650 mg at 07/24/11 [id number]  . amLODipine (NORVASC) tablet 5 mg  5 mg Oral Daily Luetta Nutting, DO   5 mg at 07/24/11 1006  . DULoxetine (CYMBALTA) DR capsule 30 mg  30 mg Oral BID Luetta Nutting, DO   30 mg at 07/24/11 2159  . heparin injection 5,000 Units  5,000 Units Subcutaneous Q8H Luetta Nutting, DO   5,000 Units at 07/25/11 0617  . hydrALAZINE (APRESOLINE) injection 10 mg  10 mg Intravenous Q4H PRN Luetta Nutting, DO   10 mg at 07/25/11 0746  . insulin aspart (novoLOG) injection 0-15 Units  0-15 Units Subcutaneous TID WC Luetta Nutting, DO   2 Units at 07/25/11 0810  . metoCLOPramide (REGLAN) tablet 5 mg  5 mg Oral TID AC Waldemar Dickens, MD   5 mg at 07/25/11 [id number]  . metoprolol (LOPRESSOR) tablet 100 mg  100 mg Oral BID Waldemar Dickens, MD   100 mg at 07/24/11 2159  . ondansetron (ZOFRAN) tablet 4 mg  4 mg Oral Q6H PRN Luetta Nutting, DO       Or  . ondansetron (ZOFRAN) injection 4 mg  4 mg Intravenous Q6H PRN Luetta Nutting, DO      . pantoprazole (PROTONIX) EC tablet 40 mg  40 mg Oral Q1200 Luetta Nutting, DO   40 mg at 07/24/11 1006  . pneumococcal 23 valent vaccine (PNU-IMMUNE) injection 0.5 mL  0.5 mL Intramuscular Tomorrow-1000 Lind Covert, MD      . polyethylene glycol (MIRALAX / GLYCOLAX) packet 17 g  17 g Oral BID Waldemar Dickens, MD   17 g at 07/24/11 2200  . pregabalin (LYRICA) capsule 50 mg  50 mg Oral TID Luetta Nutting, DO   50 mg at 07/24/11 2159  . senna (SENOKOT) tablet 8.6 mg  1 tablet Oral Daily Waldemar Dickens, MD   8.6 mg at 07/24/11 1006  . simvastatin (ZOCOR) tablet 20 mg  20 mg Oral Daily Waldemar Dickens, MD   20 mg at 07/24/11 1006  . sodium chloride 0.9 % injection 3 mL  3 mL Intravenous Q12H Luetta Nutting, DO   3 mL at 07/24/11 2206  . DISCONTD: 0.9 %  sodium chloride infusion   Intravenous Continuous Luetta Nutting, DO 100 mL/hr at 07/24/11 0049       PE: Gen: NAD, Obese HEENT: MMM CV: RRR Res: Regular effort Abd: non-painful to palpation Ext/Musc: 2+ peripheral pulses  Neuro: CN grossly intact  Labs/Studies:   Lab 07/25/11 0505 07/24/11 0610 07/23/11 0350  NA 137 137 138  K 4.4 3.8 3.9  CL 104 103 103  CO2 19 20 22   BUN 22 18 21   CREATININE 2.05* 1.82* 1.81*  LABGLOM -- -- --  GLUCOSE 143* -- --  CALCIUM 9.0 9.0 9.0   CBG (last 3)   Basename 07/25/11 0602 07/24/11 2115 07/24/11 1611  GLUCAP 146* 184* 244*     Assessment/Plan: 46 yo with hypertension, diabetes and hyperlipidemia presents with abdominal pain and nausea and subsequent poor po intake.   1. Abdominal pain/Nausea: Resolved. Likely gastritis vs gastroparesis vs Constipation. Pt w/ BM last night, w/ improvement in symptoms. Tolerating PO.  - Likely DC today w/ home bowel regimen and close follow up  2. ARF: Unsure of etiology. Dehydration still a possibility. Possibly due to persistent HTN.Cr 1.82 yesterday now 2.05. IVF DC yesterday and pt only taking fluids PO.  Baseline Cr appears to  be around 1.1.  - Continue to hold ARB and Metformin - Discussed importance of fluid intake w/ pt.   - Pt to avoid NSAIDs - f/u in outpt clinic this week. .  -Check Cr in AM.   3. HTN: Symptomatic HTN with headache and evidence acute kidney injury. No HA today  -Will increase Norvasc to 10mg  - Continue metoprolol 100mg  BID - Continue to hold ARB   - Continue hydralazine prn SBP > 180.   4. DM: Moderate control with previous A1c in April 8.4. CBGs fairly well controlled this admission.   - Continue SSI for now.  - Hold Metformin  5. HLD: Lipid panel as above.  - Continue simvastatin  - Pt to need close outpt follow up  6. Neuropathy: Continue lyrica. Appears to be having outpatient workup done by Dr. Jannifer Franklin for neuropathy and weakness. No acute changes regarding this.   7. Anemia: Normocytic, likely from chronic disease. Hgb stable, continue to follow   8. FEN/GI: tolerating clear liquids. BM last night that was soft.  No IVF due to good PO -advance diet to soft, then low sodium regular as tolerated.   9. PPX:  - heparin.  - Protonix  10. Dispo: To home pending continued clinical improvement. Likely DC today  LOS 3  Signed: Linna Darner, MD Family Medicine Resident PGY-1 [phone] 07/25/2011 8:11 AM

## 2011-07-25 NOTE — Discharge Summary (Signed)
Family Medicine Resident Discharge Summary  Patient ID: [name] [id number] 46 y.o. 11-25-1965  Admit date: 07/22/2011  Discharge date and time: 07/25/10  Admitting Physician: Lind Covert, MD   Discharge Physician: Dorcas Mcmurray, MD  Admission Diagnoses: Right leg weakness [729.89] Abdominal pain  Discharge Diagnoses: HTN, ARF, Gastritis/gastroporesis, Dehydration  Admission Condition: good  Discharged Condition: good  Indication for Admission: Uncontrolled HTN, ARF and poor po w/ likely dehydration  Hospital Course: 46 yo with h/o T2DM, neuropathy, HLD, diastolic dysfunction, Chronic renal insufficiency stage II with abdominal pain and nausea, with poor po intake, and ARF.  Abdominal pain: Pt admitted w/ h/o 3wks of abdominal pain made worse w/ eating, no BM for several days and poor PO for 1 wk. Previous CT on 6/17 showed ventral  hernia w/o strangulation, Cyst of R kidney, and uterine fibroids. No evidence of incarcerated ventral hernia on admission. Lipase and LFTs were normal Cardiac enzymes and EKG were also obtained, and were normal, to help r/o cardiac cause of pain. Pt symptoms slowly improved w/ fluid rehydration and rest and w/ bowel movement. Pt asymptomatic at time of discharge  Acute Renal Failure: Unsure of etiology as Cr started to bump back in April w/ Cr of >1.4 as baseline around 1.1. Likely some component of dehydration (prerenal) as Cr improved w/ IVF. Additionally pt very HTN on admission (SBP greater than 210) and for several months leading up to Cr bump. This is likley also contributing.  On admission Cr was 2.2. Cr trended down overall w/ final read of 2.05 at time of DC. Pts home Diovan was held as there was concern for renal injury. Metformin also held during admission. Pt started on IVF and produced steady urine output throughout admission. IVF DC on 07/24/10. Pt will need close f/u as outpt for renal function. Renal US noted below.  HTN: Pt noted  to be significantly hypertensive at time of admission. Home Diovan was DCd due to ARF. Home metoprolol was titrated up over the course of the admission to 100mg  BID w/ improvement in BP. Additionally Norvasc was started and increased to 10mg  Qday. Hydralazine was kept on as a PRN medication for SBP greater than 180. SBP of 175 prior to DC, which was taken before her mornning BP medications were administered.   HLD: Lipid panel obtained during admission due to previous elevated lipids noted back in December of 2012. Pt panel noted below. This constitutes significant health risk to pt who is also diabetic and very hypertensive. Pt started on Simvastatin during admission.   DM: Pt home metformin was held due to worsening renal function. Continued on SSI w/ good glucose control. Slight elevation at time of DC from significantly improve po. Pt to hold off on home metformin until cleared by PCP.   Neuropathy: Pt w/ significant neuropathy and currently undergoing workup by Dr. Jannifer Franklin. Continued home lyrica   Significant Diagnostic Studies:   Lab 07/25/11 0505 07/24/11 0610 07/23/11 0350  CREATININE 2.05* 1.82* 1.81*  Cr on admission on 6/20 was 2.2   Lab 07/23/11 0350 07/22/11 2035 07/22/11 1633  WBC 8.1 7.0 7.9  HGB 10.0* 9.8* 10.7*  HCT 30.1* 29.2* 31.5*  PLT 445* 379 465*   Urinalysis    Component Value Date/Time   COLORURINE YELLOW 07/22/2011 1526   APPEARANCEUR CLOUDY* 07/22/2011 1526   LABSPEC 1.022 07/22/2011 1526   PHURINE 5.5 07/22/2011 1526   GLUCOSEU 100* 07/22/2011 1526   HGBUR MODERATE* 07/22/2011 1526   [place]  07/22/2011 1526   BILIRUBINUR NEG 07/19/2011 1202   KETONESUR NEGATIVE 07/22/2011 1526   PROTEINUR >300* 07/22/2011 1526   UROBILINOGEN 0.2 07/22/2011 1526   UROBILINOGEN 0.2 07/19/2011 1202   NITRITE NEGATIVE 07/22/2011 1526   NITRITE NEG 07/19/2011 1202   LEUKOCYTESUR TRACE* 07/22/2011 1526    Urine cultures: No growth at greater than 40hrs  Lab 07/23/11 0350  07/22/11 2035  CKTOTAL 159 156  TROPONINI <0.30 <0.30  TROPONINT -- --  CKMBINDEX -- --   Lipid Panel     Component Value Date/Time   CHOL 516* 07/22/2011 2035   TRIG 758* 07/22/2011 2035   HDL 49 07/22/2011 2035   CHOLHDL 10.5 07/22/2011 2035   VLDL UNABLE TO CALCULATE IF TRIGLYCERIDE OVER 400 mg/dL 07/22/2011 2035   LDLCALC UNABLE TO CALCULATE IF TRIGLYCERIDE OVER 400 mg/dL 07/22/2011 2035   Direct LDL: 208  Lipase     Component Value Date/Time   LIPASE 13 07/22/2011 2035     Renal US: Echogenic renal parenchyma bilaterally consistent with renal medical disease. Increased echogenicity of the liver parenchyma suggests hepatic steatosis.  Slightly complex well defined cystic lesion in the upper pole of the right kidney, probably a proteinaceous or hemorrhagic cyst. It has minimally increased in size since the CT scan dated 08/18/2010.   CBG (last 3)   Basename 07/25/11 1118 07/25/11 0602 07/24/11 2115  GLUCAP 214* 146* 184*    Discharge Exam: Gen: NAD, Obese  HEENT: MMM  CV: RRR  Res: Regular effort  Abd: non-painful to palpation  Ext/Musc: 2+ peripheral pulses  Neuro: CN grossly intact   Disposition: home  Patient Instructions:  Medication List  As of 07/25/2011 12:21 PM   STOP taking these medications         cephALEXin 500 MG capsule      metFORMIN 1000 MG tablet      valsartan-hydrochlorothiazide 160-25 MG per tablet         TAKE these medications         amLODipine 10 MG tablet   Commonly known as: NORVASC   Take 1 tablet (10 mg total) by mouth daily.      DULoxetine 30 MG capsule   Commonly known as: CYMBALTA   Take 30 mg by mouth 2 (two) times daily.      insulin aspart protamine-insulin aspart (70-30) 100 UNIT/ML injection   Commonly known as: NOVOLOG 70/30   Inject 25 Units into the skin 2 (two) times daily with a meal.      insulin aspart protamine-insulin aspart (70-30) 100 UNIT/ML injection   Commonly known as: NOVOLOG 70/30   Inject  25-30 Units into the skin 2 (two) times daily with a meal. Inject 25 units every morning and inject 30 units every night at bedtime.      metoCLOPramide 5 MG tablet   Commonly known as: REGLAN   Take 1 tablet (5 mg total) by mouth 3 (three) times daily before meals.      metoprolol 100 MG tablet   Commonly known as: LOPRESSOR   Take 1 tablet (100 mg total) by mouth 2 (two) times daily.      ondansetron 4 MG tablet   Commonly known as: ZOFRAN   Take 1 tablet (4 mg total) by mouth every 6 (six) hours as needed for nausea.      pregabalin 50 MG capsule   Commonly known as: LYRICA   Take 1 capsule (50 mg total) by mouth 3 (three) times daily. May  increase and take up to 2 tablets three times a day as needed for neuropathic pain.      simvastatin 20 MG tablet   Commonly known as: ZOCOR   Take 1 tablet (20 mg total) by mouth daily.            Activity: activity as tolerated Diet: regular Wound Care: none needed  Follow-up with Dr. Verdie Drown at [facility]  Follow-up Items: 1. HTN, pt started on Metoprolol and Norvasc during admission w/ DC of ARB due to ARF.  2. HLD: Pt started on Statin but will likely need further intervention. 3. Anemia: workup as needed.   Signed: Linna Darner, MD Family Medicine Resident PGY-1 [phone] 07/25/2011 12:21 PM

## 2011-07-25 NOTE — Progress Notes (Signed)
FMTS Attending Daily Note: Tkeyah Burkman MD [phone] pager office [phone] I  have seen and examined this patient, reviewed their chart. I have discussed this patient with the resident. I agree with the resident's findings, assessment and care plan. 

## 2011-07-25 NOTE — Discharge Instructions (Signed)
You were admitted to the hospital due to severe abdominal pain, dehydration, high blood pressure, and your kidneys being ill.  You responded well to IV fluids and medications.  We have made several changes to your medications, please take all medications as prescribed at time of dishcarge from the hospital Please call the [place] center tomorrow to schedule a follow up appointment with Dr. Verdie Drown sometime next week.  Have a great weekend.  Please make sure you do not take your Diovan or any NSAIDs (Advil, ibuprofen, Aspirin, Naprosyn, etc) as these medications may further worsen your kidneys until they heal.

## 2011-07-25 NOTE — Progress Notes (Signed)
Pt. Discharged 07/25/2011  11:56 AM Discharge instructions reviewed with patient/family. Patient/family verbalized understanding. All Rx's given. Questions answered as needed. Pt. Discharged to home with family/self.  [name]

## 2011-07-25 NOTE — Progress Notes (Signed)
FMTS Attending Daily Note: Catherine Lokken MD [phone] pager office [phone] I have discussed this patient with the resident and reviewed the assessment and plan as documented above. I agree wit the resident's findings and plan.  

## 2011-07-26 ENCOUNTER — Other Ambulatory Visit (HOSPITAL_COMMUNITY): Payer: Medicaid Other

## 2011-07-26 NOTE — Discharge Summary (Signed)
Family Medicine Teaching Service  Discharge Note : Attending Kollin Udell MD Pager [phone] Office [phone] I have seen and examined this patient, reviewed their chart and discussed discharge planning wit the resident at the time of discharge. I agree with the discharge plan as above.  

## 2011-09-17 ENCOUNTER — Telehealth: Payer: Self-pay | Admitting: *Deleted

## 2011-09-17 NOTE — Telephone Encounter (Signed)
Received call from  [facility]  requesting medical clearance since patient reports heart murmer .   Please call back to [phone], fax # [phone]. They need this faxed .

## 2011-09-18 NOTE — Telephone Encounter (Signed)
Patient had ECHO 2012 that showed mild aortic and mitral valve regurgitation. She is at low risk for infective endocarditis and no antibiotics are indicated.   Called [employer] and left message.

## 2011-11-12 ENCOUNTER — Telehealth: Payer: Self-pay | Admitting: *Deleted

## 2011-11-12 NOTE — Telephone Encounter (Signed)
Patient was admitted for ARF & HTN with [facility] in [place] on 11/02/11 and 11/07/11.  Due to patient having Medicaid, office calling to request NPI #  to authorize coverage for hospital visits.  NPI # given for month of October.  Nolene Ebbs, RN

## 2012-09-13 ENCOUNTER — Telehealth: Payer: Self-pay | Admitting: *Deleted

## 2012-09-13 NOTE — Telephone Encounter (Signed)
Letter sent regarding diabetes follow up care Elizabeth Zakayla Martinec, RN-BSN   

## 2017-05-23 ENCOUNTER — Ambulatory Visit: Payer: Self-pay | Admitting: Physician Assistant

## 2017-06-01 ENCOUNTER — Other Ambulatory Visit: Payer: Self-pay

## 2017-06-01 ENCOUNTER — Ambulatory Visit (INDEPENDENT_AMBULATORY_CARE_PROVIDER_SITE_OTHER): Payer: Medicare Other | Admitting: Physician Assistant

## 2017-06-01 ENCOUNTER — Encounter: Payer: Self-pay | Admitting: Physician Assistant

## 2017-06-01 VITALS — BP 140/76 | HR 98 | Temp 98.4°F | Resp 18 | Ht 69.0 in | Wt 247.2 lb

## 2017-06-01 DIAGNOSIS — N184 Chronic kidney disease, stage 4 (severe): Secondary | ICD-10-CM | POA: Diagnosis not present

## 2017-06-01 DIAGNOSIS — Z1159 Encounter for screening for other viral diseases: Secondary | ICD-10-CM | POA: Diagnosis not present

## 2017-06-01 DIAGNOSIS — Z114 Encounter for screening for human immunodeficiency virus [HIV]: Secondary | ICD-10-CM

## 2017-06-01 DIAGNOSIS — E1122 Type 2 diabetes mellitus with diabetic chronic kidney disease: Secondary | ICD-10-CM | POA: Diagnosis not present

## 2017-06-01 DIAGNOSIS — I509 Heart failure, unspecified: Secondary | ICD-10-CM

## 2017-06-01 MED ORDER — METOPROLOL SUCCINATE ER 25 MG PO TB24: 25.0000 mg | ORAL_TABLET | Freq: Every day | ORAL | 3 refills | Status: DC

## 2017-06-01 MED ORDER — ATORVASTATIN CALCIUM 80 MG PO TABS: 80.0000 mg | ORAL_TABLET | Freq: Every day | ORAL | 3 refills | Status: DC

## 2017-06-01 NOTE — Patient Instructions (Addendum)
Please get a copy of your most recent MRI and PAP, mammogram, colonoscopy, immunizations.    We recommend that you schedule a mammogram for breast cancer screening. Typically, you do not need a referral to do this. Please contact a local imaging center to schedule your mammogram.  [facility] - [phone]  *ask for the Radiology Department [address] ([place]) - [phone] or [phone]  [facility] - [phone] [place] [phone] [facility] - [phone]  *ask for the [facility] - [phone]  *ask for the Radiology Department [facility] - [phone]  *ask for the [place] - [phone]   IF you received an x-ray today, you will receive an invoice from [facility]. Please contact [facility] at [phone] with questions or concerns regarding your invoice.   IF you received labwork today, you will receive an invoice from [employer]. Please contact [facility] at [phone] with questions or concerns regarding your invoice.   Our billing staff will not be able to assist you with questions regarding bills from these companies.  You will be contacted with the lab results as soon as they are available. The fastest way to get your results is to activate your My Chart account. Instructions are located on the last page of this paperwork. If you have not heard from [place] regarding the results in 2 weeks, please contact this office.

## 2017-06-01 NOTE — Progress Notes (Signed)
06/01/2017 9:37 AM   DOB: April 04, 1965 / MRN: [id number]  SUBJECTIVE:  [name] is a 51 y.o. female presenting for to establish care.  She goes to dialysis 3 days a week.  She has a history of CHF, hypertension, uncontrolled diabetes, restless legs, chronic back pain worse with exercise.   She is requesting referrals to cardiology, neurology for her back, ophthalmology, podiatry.  There is a paucity of data in epic as she has been getting care for all of these problems on the [place].  Fortunately she feels well today and denies any acute complaints.  Patient Active Problem List   Diagnosis Date Noted  . ESRD (end stage renal disease) ([place]) 05/21/2011  . Diabetic retinopathy 05/21/2011  . Diastolic dysfunction 45/80/9983  . Hyperlipidemia, mixed 01/30/2011  . Type II diabetes mellitus with complication, uncontrolled ([place]) 01/29/2011  . Neuropathy 01/29/2011  . Hypertension     Current Outpatient Medications:  .  amLODipine (NORVASC) 5 MG tablet, TK 1 T PO BID, Disp: , Rfl: 0 .  cinacalcet (SENSIPAR) 90 MG tablet, Take 90 mg by mouth daily., Disp: , Rfl: 3 .  DULoxetine (CYMBALTA) 30 MG capsule, Take 30 mg by mouth 2 (two) times daily., Disp: , Rfl:  .  gabapentin (NEURONTIN) 100 MG capsule, TK ONE C PO D, Disp: , Rfl: 3 .  hydrALAZINE (APRESOLINE) 25 MG tablet, Take 25 mg by mouth 2 (two) times daily., Disp: , Rfl:  .  hydrOXYzine (ATARAX/VISTARIL) 25 MG tablet, TK 1 T PO Q 12 H PRN, Disp: , Rfl: 1 .  insulin aspart protamine-insulin aspart (NOVOLOG 70/30) (70-30) 100 UNIT/ML injection, Inject 25-30 Units into the skin 2 (two) times daily with a meal. Inject 25 units every morning and inject 30 units every night at bedtime., Disp: , Rfl:  .  lisinopril (PRINIVIL,ZESTRIL) 40 MG tablet, TK 1 T PO D, Disp: , Rfl: 1 .  oxyCODONE-acetaminophen (PERCOCET) 10-325 MG tablet, TK 1 T PO QID PRF CHRONIC LUMBAR PAIN, Disp: , Rfl: 0 .  pramipexole (MIRAPEX) 0.5 MG tablet, TK 1  T PO UTD, Disp: , Rfl: 1 .  PROAIR HFA 108 (90 Base) MCG/ACT inhaler, INHALE 1 TO 2 PFS PO Q 4 TO 6 H COU, Disp: , Rfl: 2 .  atorvastatin (LIPITOR) 80 MG tablet, Take 1 tablet (80 mg total) by mouth daily. Take 1/2 tab daily for one week, then increase to the full tab., Disp: 90 tablet, Rfl: 3 .  metoprolol succinate (TOPROL-XL) 25 MG 24 hr tablet, Take 1 tablet (25 mg total) by mouth daily., Disp: 90 tablet, Rfl: 3 .  pregabalin (LYRICA) 50 MG capsule, Take 1 capsule (50 mg total) by mouth 3 (three) times daily. May increase and take up to 2 tablets three times a day as needed for neuropathic pain., Disp: 180 capsule, Rfl: 1    She has No Known Allergies.   She  has a past medical history of Abdominal hernia, Anemia, Arthritis, Headache(784.0), Heart murmur, High cholesterol, Hypertension, Nerve pain, Pericardial effusion, Pneumonia, Renal disorder, and Type II diabetes mellitus ([place]).    She  reports that she has been smoking cigarettes.  She has a 20.00 pack-year smoking history. She has never used smokeless tobacco. She reports that she does not drink alcohol or use drugs. She  reports that she does not currently engage in sexual activity. The patient  has a past surgical history that includes Pericardial window (02/2004); Tubal ligation (05/1995); and Cesarean  section (1996; 1997).  Her family history includes Cancer in her brother; Diabetes in her brother, father, mother, and sister; Heart disease in her father; Hyperlipidemia in her father; Hypertension in her brother, father, mother, and sister; Stroke in her father and mother.  Review of Systems  Constitutional: Negative for chills, diaphoresis and fever.  Eyes: Negative.   Respiratory: Negative for cough, hemoptysis, sputum production, shortness of breath and wheezing.   Cardiovascular: Negative for chest pain, orthopnea and leg swelling.  Gastrointestinal: Negative for abdominal pain, blood in stool, constipation, diarrhea, heartburn,  melena, nausea and vomiting.  Genitourinary: Negative for dysuria, flank pain, frequency, hematuria and urgency.  Skin: Negative for rash.  Neurological: Negative for dizziness, sensory change, speech change, focal weakness and headaches.    The problem list and medications were reviewed and updated by myself where necessary and exist elsewhere in the encounter.   OBJECTIVE:  BP 140/76 (BP Location: Right Arm, Patient Position: Sitting, Cuff Size: Normal)   Pulse 98   Temp 98.4 F (36.9 C) (Oral)   Resp 18   Ht 5\' 9"  (1.753 m)   Wt 247 lb 3.2 oz (112.1 kg)   LMP 03/28/2011   SpO2 93%   BMI 36.51 kg/m   Physical Exam  Constitutional: She is oriented to person, place, and time. She appears well-developed and well-nourished. No distress.  Eyes: Pupils are equal, round, and reactive to light. EOM are normal.  Cardiovascular: Normal rate, regular rhythm, S1 normal, S2 normal, normal heart sounds and intact distal pulses. Exam reveals no gallop, no friction rub and no decreased pulses.  No murmur heard. Pulmonary/Chest: Effort normal. No stridor. No respiratory distress. She has no wheezes. She has no rales.  Abdominal: She exhibits no distension.  Musculoskeletal: Normal range of motion. She exhibits no edema.  Neurological: She is alert and oriented to person, place, and time. No cranial nerve deficit. Gait normal.  Skin: Skin is warm and dry. She is not diaphoretic.  Psychiatric: She has a normal mood and affect.  Vitals reviewed.   Lab Results  Component Value Date   WBC 8.1 07/23/2011   HGB 10.0 (L) 07/23/2011   HCT 30.1 (L) 07/23/2011   MCV 85.8 07/23/2011   PLT 445 (H) 07/23/2011    Lab Results  Component Value Date   CREATININE 2.05 (H) 07/25/2011   BUN 22 07/25/2011   NA 137 07/25/2011   K 4.4 07/25/2011   CL 104 07/25/2011   CO2 19 07/25/2011    Lab Results  Component Value Date   ALT 11 07/22/2011   AST 15 07/22/2011   ALKPHOS 55 07/22/2011   BILITOT  0.2 (L) 07/22/2011    Lab Results  Component Value Date   TSH 1.791 01/30/2011    Lab Results  Component Value Date   HGBA1C 8.4 05/12/2011    Lab Results  Component Value Date   CHOL 516 (H) 07/22/2011   HDL 49 07/22/2011   LDLCALC UNABLE TO CALCULATE IF TRIGLYCERIDE OVER 400 mg/dL 07/22/2011   LDLDIRECT 208 (H) 07/23/2011   TRIG 758 (H) 07/22/2011   CHOLHDL 10.5 07/22/2011     No results found for this or any previous visit (from the past 72 hour(s)).  No results found.  ASSESSMENT AND PLAN:  Oluchi was seen today for establish care.  Diagnoses and all orders for this visit:  Chronic congestive heart failure, unspecified heart failure type [facility]): I have very little data in the system however she is functioning at  her baseline at this time and compliant with dialysis.  She has several chronic conditions that will need specialist management including cardiology, ophthalmology, podiatry and likely endocrinology as well as neurosurgery.  I have started her back on her statin today along with 25 mg metoprolol as she cannot remember the previous dose.  Collecting baseline labs today.  I have asked that she try to get as much of her old health record together as possible and I would like to see the MRI of her back, the colonoscopy, mammograms and Pap smears, and any immunizations that she is received.  I will bring her back in 2 weeks after labs have resulted so we can discuss and hopefully focus on her history of chronic pain. -     Ambulatory referral to Cardiology -     TSH  Type 2 diabetes mellitus with stage 4 chronic kidney disease, unspecified whether long term insulin use ([place]) -     Ambulatory referral to Ophthalmology -     Ambulatory referral to Podiatry -     CBC -     Hemoglobin A1c -     Basic metabolic panel -     Hepatic function panel -     Lipid panel  Screening for HIV (human immunodeficiency virus) -     HIV antibody  Need for hepatitis C screening  test -     Hepatitis C antibody  Need for hepatitis B screening test -     Hepatitis B surface antigen -     Hepatitis B surface antibody  Other orders -     metoprolol succinate (TOPROL-XL) 25 MG 24 hr tablet; Take 1 tablet (25 mg total) by mouth daily. -     atorvastatin (LIPITOR) 80 MG tablet; Take 1 tablet (80 mg total) by mouth daily. Take 1/2 tab daily for one week, then increase to the full tab.    The patient is advised to call or return to clinic if she does not see an improvement in symptoms, or to seek the care of the closest emergency department if she worsens with the above plan.   Philis Fendt, MHS, PA-C [facility] 06/01/2017 9:37 AM

## 2017-06-02 LAB — BASIC METABOLIC PANEL
BUN / CREAT RATIO: 4 — AB (ref 9–23)
BUN: 27 mg/dL — ABNORMAL HIGH (ref 6–24)
CO2: 23 mmol/L (ref 20–29)
Calcium: 8.8 mg/dL (ref 8.7–10.2)
Chloride: 90 mmol/L — ABNORMAL LOW (ref 96–106)
Creatinine, Ser: 6.04 mg/dL (ref 0.57–1.00)
GFR, EST AFRICAN AMERICAN: 9 mL/min/{1.73_m2} — AB (ref >59)
GFR, EST NON AFRICAN AMERICAN: 7 mL/min/{1.73_m2} — AB (ref >59)
Glucose: 170 mg/dL — ABNORMAL HIGH (ref 65–99)
POTASSIUM: 4.7 mmol/L (ref 3.5–5.2)
SODIUM: 135 mmol/L (ref 134–144)

## 2017-06-02 LAB — LIPID PANEL
CHOLESTEROL TOTAL: 261 mg/dL — AB (ref 100–199)
Chol/HDL Ratio: 6.4 ratio — ABNORMAL HIGH (ref 0.0–4.4)
HDL: 41 mg/dL (ref >39)
LDL Calculated: 147 mg/dL — ABNORMAL HIGH (ref 0–99)
TRIGLYCERIDES: 365 mg/dL — AB (ref 0–149)
VLDL Cholesterol Cal: 73 mg/dL — ABNORMAL HIGH (ref 5–40)

## 2017-06-02 LAB — HEPATIC FUNCTION PANEL
ALT: 12 IU/L (ref 0–32)
AST: 12 IU/L (ref 0–40)
Albumin: 4.4 g/dL (ref 3.5–5.5)
Alkaline Phosphatase: 177 IU/L — ABNORMAL HIGH (ref 39–117)
Bilirubin Total: 0.3 mg/dL (ref 0.0–1.2)
Bilirubin, Direct: 0.15 mg/dL (ref 0.00–0.40)
Total Protein: 8 g/dL (ref 6.0–8.5)

## 2017-06-02 LAB — HEMOGLOBIN A1C
Est. average glucose Bld gHb Est-mCnc: 260 mg/dL
HEMOGLOBIN A1C: 10.7 % — AB (ref 4.8–5.6)

## 2017-06-02 LAB — HEPATITIS B SURFACE ANTIGEN: HEP B S AG: NEGATIVE

## 2017-06-02 LAB — CBC
Hematocrit: 39.5 % (ref 34.0–46.6)
Hemoglobin: 12.3 g/dL (ref 11.1–15.9)
MCH: 28.1 pg (ref 26.6–33.0)
MCHC: 31.1 g/dL — AB (ref 31.5–35.7)
MCV: 90 fL (ref 79–97)
PLATELETS: 238 10*3/uL (ref 150–379)
RBC: 4.38 x10E6/uL (ref 3.77–5.28)
RDW: 18.4 % — AB (ref 12.3–15.4)
WBC: 6.1 10*3/uL (ref 3.4–10.8)

## 2017-06-02 LAB — HIV ANTIBODY (ROUTINE TESTING W REFLEX): HIV Screen 4th Generation wRfx: NONREACTIVE

## 2017-06-02 LAB — TSH: TSH: 1.28 u[IU]/mL (ref 0.450–4.500)

## 2017-06-02 LAB — HEPATITIS C ANTIBODY: Hep C Virus Ab: <0.1 s/co ratio (ref 0.0–0.9)

## 2017-06-02 LAB — HEPATITIS B SURFACE ANTIBODY, QUANTITATIVE: HEPATITIS B SURF AB QUANT: 20.3 m[IU]/mL

## 2017-06-09 ENCOUNTER — Encounter: Payer: Self-pay | Admitting: Cardiology

## 2017-06-09 NOTE — Progress Notes (Deleted)
Cardiology Office Note   Date:  06/09/2017   ID:  [name], DOB 1965/09/01, MRN [id number]  PCP:  Tereasa Coop, PA-C  Cardiologist:   No primary care provider on file. Referring:  ***  No chief complaint on file.     History of Present Illness: [name] is a 52 y.o. female who referred by *** for evaluation of ***.  He has a history of pericardial effusion with a window in 2006.  A search of old records indicate that this was at the time of a diagnosis of pneumonia.   He also has had severe LVH on echo with the last of these in our system in 2012.   ***    Past Medical History:  Diagnosis Date  . Abdominal hernia    "unrepaired"  . Anemia   . Arthritis    "knees"  . Headache(784.0)   . Heart murmur   . High cholesterol   . Hypertension   . 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 Pericardial effusion   . Pneumonia    "once; in the 2000's"  . Renal disorder    "they saw a spot on my kidney; they think it's a cyst"  . Type II diabetes mellitus ([place])     Past Surgical History:  Procedure Laterality Date  . [place]; 1997  . PERICARDIAL WINDOW  02/2004   for pericardial effusion  . TUBAL LIGATION  05/1995     Current Outpatient Medications  Medication Sig Dispense Refill  . amLODipine (NORVASC) 5 MG tablet TK 1 T PO BID  0  . atorvastatin (LIPITOR) 80 MG tablet Take 1 tablet (80 mg total) by mouth daily. Take 1/2 tab daily for one week, then increase to the full tab. 90 tablet 3  . cinacalcet (SENSIPAR) 90 MG tablet Take 90 mg by mouth daily.  3  . DULoxetine (CYMBALTA) 30 MG capsule Take 30 mg by mouth 2 (two) times daily.    Marland Kitchen gabapentin (NEURONTIN) 100 MG capsule TK ONE C PO D  3  . hydrALAZINE (APRESOLINE) 25 MG tablet Take 25 mg by mouth 2 (two) times daily.    . hydrOXYzine (ATARAX/VISTARIL) 25 MG tablet TK 1 T PO Q 12 H PRN  1  . insulin aspart protamine-insulin aspart (NOVOLOG 70/30) (70-30) 100  UNIT/ML injection Inject 25-30 Units into the skin 2 (two) times daily with a meal. Inject 25 units every morning and inject 30 units every night at bedtime.    Marland Kitchen lisinopril (PRINIVIL,ZESTRIL) 40 MG tablet TK 1 T PO D  1  . metoprolol succinate (TOPROL-XL) 25 MG 24 hr tablet Take 1 tablet (25 mg total) by mouth daily. 90 tablet 3  . oxyCODONE-acetaminophen (PERCOCET) 10-325 MG tablet TK 1 T PO QID PRF CHRONIC LUMBAR PAIN  0  . pramipexole (MIRAPEX) 0.5 MG tablet TK 1 T PO UTD  1  . pregabalin (LYRICA) 50 MG capsule Take 1 capsule (50 mg total) by mouth 3 (three) times daily. May increase and take up to 2 tablets three times a day as needed for neuropathic pain. 180 capsule 1  . PROAIR HFA 108 (90 Base) MCG/ACT inhaler INHALE 1 TO 2 PFS PO Q 4 TO 6 H COU  2   No current facility-administered medications for this visit.     Allergies:   Patient has no known allergies.    Social History:  The  patient  reports that she has been smoking cigarettes.  She has a 20.00 pack-year smoking history. She has never used smokeless tobacco. She reports that she does not drink alcohol or use drugs.   Family History:  The patient's ***family history includes Cancer in her brother; Diabetes in her brother, father, mother, and sister; Heart disease in her father; Hyperlipidemia in her father; Hypertension in her brother, father, mother, and sister; Stroke in her father and mother.    ROS:  Please see the history of present illness.   Otherwise, review of systems are positive for {NONE DEFAULTED:18576::"none"}.   All other systems are reviewed and negative.    PHYSICAL EXAM: VS:  LMP 03/28/2011  , BMI There is no height or weight on file to calculate BMI. GENERAL:  Well appearing HEENT:  Pupils equal round and reactive, fundi not visualized, oral mucosa unremarkable NECK:  No jugular venous distention, waveform within normal limits, carotid upstroke brisk and symmetric, no bruits, no thyromegaly LYMPHATICS:  No  cervical, inguinal adenopathy LUNGS:  Clear to auscultation bilaterally BACK:  No CVA tenderness CHEST:  Unremarkable HEART:  PMI not displaced or sustained,S1 and S2 within normal limits, no S3, no S4, no clicks, no rubs, *** murmurs ABD:  Flat, positive bowel sounds normal in frequency in pitch, no bruits, no rebound, no guarding, no midline pulsatile mass, no hepatomegaly, no splenomegaly EXT:  2 plus pulses throughout, no edema, no cyanosis no clubbing SKIN:  No rashes no nodules NEURO:  Cranial nerves II through XII grossly intact, motor grossly intact throughout PSYCH:  Cognitively intact, oriented to person place and time    EKG:  EKG {ACTION; IS/IS NOM:[id number]} ordered today. The ekg ordered today demonstrates ***   Recent Labs: 06/01/2017: ALT 12; BUN 27; Creatinine, Ser 6.04; Hemoglobin 12.3; Platelets 238; Potassium 4.7; Sodium 135; TSH 1.280    Lipid Panel    Component Value Date/Time   CHOL 261 (H) 06/01/2017 0959   TRIG 365 (H) 06/01/2017 0959   HDL 41 06/01/2017 0959   CHOLHDL 6.4 (H) 06/01/2017 0959   CHOLHDL 10.5 07/22/2011 2035   VLDL UNABLE TO CALCULATE IF TRIGLYCERIDE OVER 400 mg/dL 07/22/2011 2035   LDLCALC 147 (H) 06/01/2017 0959   LDLDIRECT 208 (H) 07/23/2011 1135      Wt Readings from Last 3 Encounters:  06/01/17 247 lb 3.2 oz (112.1 kg)  07/24/11 225 lb 14.4 oz (102.5 kg)  07/19/11 233 lb (105.7 kg)      Other studies Reviewed: Additional studies/ records that were reviewed today include: ***. Review of the above records demonstrates:  Please see elsewhere in the note.  ***   ASSESSMENT AND PLAN:  LVH:  ***   Current medicines are reviewed at length with the patient today.  The patient {ACTIONS; HAS/DOES NOT HAVE:19233} concerns regarding medicines.  The following changes have been made:  {PLAN; NO CHANGE:13088:s}  Labs/ tests ordered today include: *** No orders of the defined types were placed in this encounter.    Disposition:    FU with ***    Signed, Minus Breeding, MD  06/09/2017 6:57 PM    [facility]

## 2017-06-10 ENCOUNTER — Ambulatory Visit: Payer: Self-pay | Admitting: Cardiology

## 2017-06-15 ENCOUNTER — Ambulatory Visit: Payer: Medicare Other | Admitting: Physician Assistant

## 2017-06-15 ENCOUNTER — Encounter: Payer: Self-pay | Admitting: Cardiology

## 2017-06-16 NOTE — Progress Notes (Signed)
Cardiology Office Note   Date:  06/17/2017   ID:  [name], DOB 1965-12-08, MRN [id number]  PCP:  Tereasa Coop, PA-C  Cardiologist:   No primary care provider on file.   Chief Complaint  Patient presents with  . Hypertension      History of Present Illness: [name] is a 52 y.o. female who referred by Tereasa Coop, PA-C for evaluation of LVH.  He has a history of pericardial effusion with a window in 2006.  A search of old records indicate that this was at the time of a diagnosis of pneumonia.   He also has had severe LVH on echo with the last of these in our system in 2012.    She moved away to [facility] for a while but is now back here.  She actually reports no cardiac problems.  She is limited by severe back problems.  She has to ride in a motorized scooter if she goes to the grocery store.  She can walk short distances.  She goes to dialysis 3 times a week.  She actually has no cardiac complaints. The patient denies any new symptoms such as chest discomfort, neck or arm discomfort. There has been no new shortness of breath, PND or orthopnea. There have been no reported palpitations, presyncope or syncope.     Past Medical History:  Diagnosis Date  . Anemia   . Arthritis    "knees"  . ESRD (end stage renal disease) ([place])   . Headache(784.0)   . High cholesterol   . Hypertension   . 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 Pericardial effusion   . PVD (peripheral vascular disease) ([place])    Right leg stent in [place].  (No records)  . Type II diabetes mellitus ([place])     Past Surgical History:  Procedure Laterality Date  . [place]; 1997  . PERICARDIAL WINDOW  02/2004   for pericardial effusion  . TUBAL LIGATION  05/1995     Current Outpatient Medications  Medication Sig Dispense Refill  . amLODipine (NORVASC) 5 MG tablet TK 1 T PO BID  0  . atorvastatin (LIPITOR) 80 MG tablet Take 1 tablet  (80 mg total) by mouth daily. Take 1/2 tab daily for one week, then increase to the full tab. 90 tablet 3  . cinacalcet (SENSIPAR) 90 MG tablet Take 90 mg by mouth daily.  3  . DULoxetine (CYMBALTA) 30 MG capsule Take 30 mg by mouth 2 (two) times daily.    Marland Kitchen gabapentin (NEURONTIN) 100 MG capsule TK ONE C PO D  3  . hydrALAZINE (APRESOLINE) 25 MG tablet Take 25 mg by mouth 2 (two) times daily.    . hydrOXYzine (ATARAX/VISTARIL) 25 MG tablet TK 1 T PO Q 12 H PRN  1  . insulin aspart protamine-insulin aspart (NOVOLOG 70/30) (70-30) 100 UNIT/ML injection Inject 25-30 Units into the skin 2 (two) times daily with a meal. Inject 25 units every morning and inject 30 units every night at bedtime.    Marland Kitchen lisinopril (PRINIVIL,ZESTRIL) 40 MG tablet TK 1 T PO D  1  . metoprolol succinate (TOPROL-XL) 25 MG 24 hr tablet Take 1 tablet (25 mg total) by mouth daily. 90 tablet 3  . oxyCODONE-acetaminophen (PERCOCET) 10-325 MG tablet TK 1 T PO QID PRF CHRONIC LUMBAR PAIN  0  . pramipexole (MIRAPEX) 0.5 MG tablet TK 1 T  PO UTD  1  . pregabalin (LYRICA) 50 MG capsule Take 1 capsule (50 mg total) by mouth 3 (three) times daily. May increase and take up to 2 tablets three times a day as needed for neuropathic pain. 180 capsule 1  . PROAIR HFA 108 (90 Base) MCG/ACT inhaler INHALE 1 TO 2 PFS PO Q 4 TO 6 H COU  2   No current facility-administered medications for this visit.     Allergies:   Patient has no known allergies.    Social History:  The patient  reports that she has been smoking cigarettes.  She has a 20.00 pack-year smoking history. She has never used smokeless tobacco. She reports that she does not drink alcohol or use drugs.   Family History:  The patient's family history includes Cancer in her brother; Diabetes in her brother, father, mother, and sister; Heart disease in her father; Hyperlipidemia in her father; Hypertension in her brother, father, mother, and sister; Stroke in her father and mother.     ROS:  Please see the history of present illness.   Otherwise, review of systems are positive for none.   All other systems are reviewed and negative.    PHYSICAL EXAM: VS:  BP 126/68   Pulse 89   Ht 5\' 8"  (1.727 m)   Wt 243 lb (110.2 kg)   LMP 03/28/2011   BMI 36.95 kg/m  , BMI Body mass index is 36.95 kg/m. GENERAL:  Well appearing HEENT:  Pupils equal round and reactive, fundi not visualized, oral mucosa unremarkable NECK:  No jugular venous distention, waveform within normal limits, carotid upstroke brisk and symmetric, no bruits, no thyromegaly LYMPHATICS:  No cervical, inguinal adenopathy LUNGS:  Clear to auscultation bilaterally BACK:  No CVA tenderness CHEST:  Unremarkable HEART:  PMI not displaced or sustained,S1 and S2 within normal limits, no S3, no S4, no clicks, no rubs, no murmurs ABD:  Flat, positive bowel sounds normal in frequency in pitch, no bruits, no rebound, no guarding, no midline pulsatile mass, no hepatomegaly, no splenomegaly, obese EXT:  2 plus pulses throughout, no edema, no cyanosis no clubbing, healed chronic skin ulcers, left upper arm fistula with thrill and bruit SKIN:  No rashes no nodules, lower extremity lesions NEURO:  Cranial nerves II through XII grossly intact, motor grossly intact throughout PSYCH:  Cognitively intact, oriented to person place and time    EKG:  EKG is ordered today. The ekg ordered today demonstrates sinus rhythm, rate 89, right bundle branch block, probable repolarization T wave changes, right bundle branch block is new compared to previous.   Recent Labs: 06/01/2017: ALT 12; BUN 27; Creatinine, Ser 6.04; Hemoglobin 12.3; Platelets 238; Potassium 4.7; Sodium 135; TSH 1.280    Lipid Panel    Component Value Date/Time   CHOL 261 (H) 06/01/2017 0959   TRIG 365 (H) 06/01/2017 0959   HDL 41 06/01/2017 0959   CHOLHDL 6.4 (H) 06/01/2017 0959   CHOLHDL 10.5 07/22/2011 2035   VLDL UNABLE TO CALCULATE IF TRIGLYCERIDE OVER  400 mg/dL 07/22/2011 2035   LDLCALC 147 (H) 06/01/2017 0959   LDLDIRECT 208 (H) 07/23/2011 1135      Wt Readings from Last 3 Encounters:  06/17/17 243 lb (110.2 kg)  06/01/17 247 lb 3.2 oz (112.1 kg)  07/24/11 225 lb 14.4 oz (102.5 kg)      Other studies Reviewed: Additional studies/ records that were reviewed today include: Old records and echo.  . Review of the above records  demonstrates:  Please see elsewhere in the note.     ASSESSMENT AND PLAN:  LVH: I will start with an echocardiogram.  She reports that she did not have uncontrolled hypertension.  I will likely follow-up with an MRI based on the results of this.  We talked about salt and fluid restriction.  Thankfully she remains asymptomatic.    HTN:.The blood pressure is currently controlled on the meds as listed.  She will continue  DM: All   A1c was 10.  She will continue on therapy as listed but she understandsThat this is not a target.  She says her sugars have been much better.  OBESITY:  The patient understands the need to lose weight with diet and exercise. We have discussed specific strategies for this.     Current medicines are reviewed at length with the patient today.  The patient does not have concerns regarding medicines.  The following changes have been made:  no change  Labs/ tests ordered today include: None  Orders Placed This Encounter  Procedures  . EKG 12-Lead  . ECHOCARDIOGRAM COMPLETE     Disposition:   FU with me in one year or sooner based on the echo.     Signed, Minus Breeding, MD  06/17/2017 1:41 PM    [place]

## 2017-06-17 ENCOUNTER — Encounter: Payer: Self-pay | Admitting: Cardiology

## 2017-06-17 ENCOUNTER — Ambulatory Visit (INDEPENDENT_AMBULATORY_CARE_PROVIDER_SITE_OTHER): Payer: Medicare Other | Admitting: Cardiology

## 2017-06-17 VITALS — BP 126/68 | HR 89 | Ht 68.0 in | Wt 243.0 lb

## 2017-06-17 DIAGNOSIS — I10 Essential (primary) hypertension: Secondary | ICD-10-CM | POA: Diagnosis not present

## 2017-06-17 DIAGNOSIS — Z794 Long term (current) use of insulin: Secondary | ICD-10-CM

## 2017-06-17 DIAGNOSIS — I517 Cardiomegaly: Secondary | ICD-10-CM | POA: Diagnosis not present

## 2017-06-17 DIAGNOSIS — E118 Type 2 diabetes mellitus with unspecified complications: Secondary | ICD-10-CM | POA: Diagnosis not present

## 2017-06-17 NOTE — Patient Instructions (Signed)
Medication Instructions:  Continue current medications  If you need a refill on your cardiac medications before your next appointment, please call your pharmacy.  Labwork: None Ordered   Testing/Procedures: Your physician has requested that you have an echocardiogram. Echocardiography is a painless test that uses sound waves to create images of your heart. It provides your doctor with information about the size and shape of your heart and how well your heart's chambers and valves are working. This procedure takes approximately one hour. There are no restrictions for this procedure.   Follow-Up: Your physician wants you to follow-up in: 1 Year. You should receive a reminder letter in the mail two months in advance. If you do not receive a letter, please call our office [phone].      Thank you for choosing [facility] at [facility]!!

## 2017-06-24 ENCOUNTER — Other Ambulatory Visit: Payer: Self-pay

## 2017-06-24 ENCOUNTER — Ambulatory Visit (INDEPENDENT_AMBULATORY_CARE_PROVIDER_SITE_OTHER): Payer: Medicare Other | Admitting: Physician Assistant

## 2017-06-24 ENCOUNTER — Encounter: Payer: Self-pay | Admitting: Physician Assistant

## 2017-06-24 VITALS — BP 124/64 | HR 89 | Temp 98.4°F | Ht 68.0 in | Wt 242.8 lb

## 2017-06-24 DIAGNOSIS — E669 Obesity, unspecified: Secondary | ICD-10-CM | POA: Diagnosis not present

## 2017-06-24 DIAGNOSIS — I10 Essential (primary) hypertension: Secondary | ICD-10-CM

## 2017-06-24 DIAGNOSIS — N184 Chronic kidney disease, stage 4 (severe): Secondary | ICD-10-CM

## 2017-06-24 DIAGNOSIS — G894 Chronic pain syndrome: Secondary | ICD-10-CM

## 2017-06-24 DIAGNOSIS — E1122 Type 2 diabetes mellitus with diabetic chronic kidney disease: Secondary | ICD-10-CM | POA: Diagnosis not present

## 2017-06-24 NOTE — Patient Instructions (Addendum)
Stop hydralazine.  Let the dialysis center know this was stopped.

## 2017-06-24 NOTE — Progress Notes (Signed)
06/24/2017 12:01 PM   DOB: [birth year] / MRN: [id number]  SUBJECTIVE:  [name] is a 52 y.o. female presenting for diabetes recheck. Fasting mid 90s to 120. Checks in the day and may run up to low 200. Eye appointment with Katy Fitch on June 5th. Podiatry on June 29th. She feels well.  Needs refills of her insulin.  Taking Novalog pen 70/30 tid.  Does not have the pen with her today.  Would like a referral to pain management.  Will continue seeing pain in [place] part of state until new management in place.    She has No Known Allergies.   She  has a past medical history of Anemia, Arthritis, ESRD (end stage renal disease) ([place]), Headache(784.0), High cholesterol, Hypertension, Nerve pain, Pericardial effusion, PVD (peripheral vascular disease) ([place]), and Type II diabetes mellitus ([place]).    She  reports that she has been smoking cigarettes.  She has a 20.00 pack-year smoking history. She has never used smokeless tobacco. She reports that she does not drink alcohol or use drugs. She  reports that she does not currently engage in sexual activity. The patient  has a past surgical history that includes Pericardial window (02/2004); Tubal ligation (05/1995); and Cesarean section (1996; 1997).  Her family history includes Cancer in her brother; Diabetes in her brother, father, mother, and sister; Heart disease in her father; Hyperlipidemia in her father; Hypertension in her brother, father, mother, and sister; Stroke in her father and mother.  Review of Systems  Constitutional: Negative for chills, diaphoresis and fever.  Eyes: Negative.   Respiratory: Negative for cough, hemoptysis, sputum production, shortness of breath and wheezing.   Cardiovascular: Negative for chest pain, orthopnea and leg swelling.  Gastrointestinal: Negative for abdominal pain, blood in stool, constipation, diarrhea, heartburn, melena, nausea and vomiting.  Genitourinary: Negative for dysuria, flank pain, frequency, hematuria  and urgency.  Skin: Negative for rash.  Neurological: Negative for dizziness, sensory change, speech change, focal weakness and headaches.    The problem list and medications were reviewed and updated by myself where necessary and exist elsewhere in the encounter.   OBJECTIVE:  BP 124/64 (BP Location: Right Arm, Patient Position: Sitting, Cuff Size: Normal)   Pulse 89   Temp 98.4 F (36.9 C) (Oral)   Ht 5\' 8"  (1.727 m)   Wt 242 lb 12.8 oz (110.1 kg)   LMP 03/28/2011   SpO2 94%   BMI 36.92 kg/m   BP Readings from Last 3 Encounters:  06/24/17 124/64  06/17/17 126/68  06/01/17 140/76   Wt Readings from Last 3 Encounters:  06/24/17 242 lb 12.8 oz (110.1 kg)  06/17/17 243 lb (110.2 kg)  06/01/17 247 lb 3.2 oz (112.1 kg)   Lab Results  Component Value Date   HGBA1C 10.7 (H) 06/01/2017    Physical Exam  Constitutional: She is oriented to person, place, and time. She appears well-nourished.  Non-toxic appearance. No distress.  Eyes: Pupils are equal, round, and reactive to light. EOM are normal.  Cardiovascular: Normal rate, regular rhythm, S1 normal, S2 normal, normal heart sounds and intact distal pulses. Exam reveals no gallop, no friction rub and no decreased pulses.  No murmur heard. Pulmonary/Chest: Effort normal. No stridor. No respiratory distress. She has no wheezes. She has no rales.  Abdominal: She exhibits no distension.  Musculoskeletal: She exhibits no edema.  Neurological: She is alert and oriented to person, place, and time. No cranial nerve deficit. Gait normal.  Skin: Skin is warm  and dry. She is not diaphoretic. No pallor.  Psychiatric: She has a normal mood and affect.  Vitals reviewed.   No results found for this or any previous visit (from the past 72 hour(s)).  No results found.  ASSESSMENT AND PLAN:  Angle was seen today for follow-up.  Diagnoses and all orders for this visit:  Chronic pain syndrome -     Ambulatory referral to Pain  Clinic  Type 2 diabetes mellitus with stage 4 chronic kidney disease, unspecified whether long term insulin use ([place]) Comments: Will hold off on insulin adjusment at this time as fasting are at 100 one most morning.  She is checking BG often. 2 month follow up.   Essential hypertension Comments: Doing well.  Stopping Hydralazine. She is in dialysis three times weekly.   Obesity with serious comorbidity, unspecified classification, unspecified obesity type Comments: Encouraged weight loss.     The patient is advised to call or return to clinic if she does not see an improvement in symptoms, or to seek the care of the closest emergency department if she worsens with the above plan.   Philis Fendt, MHS, PA-C [facility] 06/24/2017 12:01 PM

## 2017-06-29 ENCOUNTER — Ambulatory Visit (HOSPITAL_COMMUNITY): Payer: Medicare Other | Attending: Cardiovascular Disease

## 2017-06-29 ENCOUNTER — Other Ambulatory Visit: Payer: Self-pay

## 2017-06-29 DIAGNOSIS — I313 Pericardial effusion (noninflammatory): Secondary | ICD-10-CM | POA: Diagnosis not present

## 2017-06-29 DIAGNOSIS — E785 Hyperlipidemia, unspecified: Secondary | ICD-10-CM | POA: Diagnosis not present

## 2017-06-29 DIAGNOSIS — I517 Cardiomegaly: Secondary | ICD-10-CM | POA: Diagnosis present

## 2017-06-29 DIAGNOSIS — E1122 Type 2 diabetes mellitus with diabetic chronic kidney disease: Secondary | ICD-10-CM | POA: Diagnosis not present

## 2017-06-29 DIAGNOSIS — I120 Hypertensive chronic kidney disease with stage 5 chronic kidney disease or end stage renal disease: Secondary | ICD-10-CM | POA: Diagnosis not present

## 2017-06-29 DIAGNOSIS — N186 End stage renal disease: Secondary | ICD-10-CM | POA: Insufficient documentation

## 2017-06-30 ENCOUNTER — Telehealth: Payer: Self-pay | Admitting: *Deleted

## 2017-06-30 DIAGNOSIS — I421 Obstructive hypertrophic cardiomyopathy: Secondary | ICD-10-CM

## 2017-06-30 NOTE — Telephone Encounter (Signed)
-----   Message from Minus Breeding, MD sent at 06/29/2017  4:43 PM EDT ----- LVH as previously noted.  I will like to follow this up with a cardiac MRI.  Please call with the patient and arrange.  Call Ms. Elberta Fortis with the results and send results to Tereasa Coop, PA-C

## 2017-06-30 NOTE — Telephone Encounter (Signed)
Pt aware of his Echo, cardiac MRI ordered and send to scheduler to be schedule

## 2017-07-05 ENCOUNTER — Telehealth: Payer: Self-pay | Admitting: Cardiology

## 2017-07-05 NOTE — Telephone Encounter (Signed)
Tried to call a second time today and voicemail still not set up and patient did not answer.

## 2017-07-07 ENCOUNTER — Other Ambulatory Visit: Payer: Self-pay

## 2017-07-07 ENCOUNTER — Emergency Department (HOSPITAL_COMMUNITY): Payer: Medicare Other

## 2017-07-07 ENCOUNTER — Encounter (HOSPITAL_COMMUNITY): Payer: Self-pay

## 2017-07-07 ENCOUNTER — Observation Stay (HOSPITAL_COMMUNITY)
Admission: EM | Admit: 2017-07-07 | Discharge: 2017-07-09 | Disposition: A | Discharge status: Home / Self Care | Payer: Medicare Other | Attending: Internal Medicine | Admitting: Internal Medicine

## 2017-07-07 ENCOUNTER — Encounter: Payer: Self-pay | Admitting: Physician Assistant

## 2017-07-07 ENCOUNTER — Ambulatory Visit (INDEPENDENT_AMBULATORY_CARE_PROVIDER_SITE_OTHER): Payer: Medicare Other | Admitting: Physician Assistant

## 2017-07-07 VITALS — BP 152/78 | HR 103 | Temp 98.2°F | Resp 16 | Ht 68.0 in | Wt 243.2 lb

## 2017-07-07 DIAGNOSIS — Z992 Dependence on renal dialysis: Secondary | ICD-10-CM | POA: Diagnosis not present

## 2017-07-07 DIAGNOSIS — I208 Other forms of angina pectoris: Secondary | ICD-10-CM

## 2017-07-07 DIAGNOSIS — Z79899 Other long term (current) drug therapy: Secondary | ICD-10-CM | POA: Diagnosis not present

## 2017-07-07 DIAGNOSIS — R0789 Other chest pain: Secondary | ICD-10-CM

## 2017-07-07 DIAGNOSIS — Z6836 Body mass index (BMI) 36.0-36.9, adult: Secondary | ICD-10-CM | POA: Diagnosis not present

## 2017-07-07 DIAGNOSIS — E669 Obesity, unspecified: Secondary | ICD-10-CM | POA: Diagnosis not present

## 2017-07-07 DIAGNOSIS — I5023 Acute on chronic systolic (congestive) heart failure: Secondary | ICD-10-CM | POA: Diagnosis not present

## 2017-07-07 DIAGNOSIS — R079 Chest pain, unspecified: Secondary | ICD-10-CM

## 2017-07-07 DIAGNOSIS — F17210 Nicotine dependence, cigarettes, uncomplicated: Secondary | ICD-10-CM | POA: Insufficient documentation

## 2017-07-07 DIAGNOSIS — D631 Anemia in chronic kidney disease: Secondary | ICD-10-CM | POA: Diagnosis not present

## 2017-07-07 DIAGNOSIS — E781 Pure hyperglyceridemia: Secondary | ICD-10-CM | POA: Diagnosis not present

## 2017-07-07 DIAGNOSIS — Z794 Long term (current) use of insulin: Secondary | ICD-10-CM | POA: Insufficient documentation

## 2017-07-07 DIAGNOSIS — I2510 Atherosclerotic heart disease of native coronary artery without angina pectoris: Secondary | ICD-10-CM | POA: Diagnosis not present

## 2017-07-07 DIAGNOSIS — E1165 Type 2 diabetes mellitus with hyperglycemia: Secondary | ICD-10-CM | POA: Diagnosis not present

## 2017-07-07 DIAGNOSIS — E1151 Type 2 diabetes mellitus with diabetic peripheral angiopathy without gangrene: Secondary | ICD-10-CM | POA: Insufficient documentation

## 2017-07-07 DIAGNOSIS — Z899 Acquired absence of limb, unspecified: Secondary | ICD-10-CM | POA: Insufficient documentation

## 2017-07-07 DIAGNOSIS — I43 Cardiomyopathy in diseases classified elsewhere: Secondary | ICD-10-CM | POA: Diagnosis not present

## 2017-07-07 DIAGNOSIS — E1121 Type 2 diabetes mellitus with diabetic nephropathy: Secondary | ICD-10-CM | POA: Diagnosis not present

## 2017-07-07 DIAGNOSIS — N186 End stage renal disease: Secondary | ICD-10-CM | POA: Diagnosis not present

## 2017-07-07 DIAGNOSIS — I1311 Hypertensive heart and chronic kidney disease without heart failure, with stage 5 chronic kidney disease, or end stage renal disease: Secondary | ICD-10-CM | POA: Insufficient documentation

## 2017-07-07 DIAGNOSIS — I209 Angina pectoris, unspecified: Secondary | ICD-10-CM

## 2017-07-07 DIAGNOSIS — R519 Headache, unspecified: Secondary | ICD-10-CM

## 2017-07-07 DIAGNOSIS — E782 Mixed hyperlipidemia: Secondary | ICD-10-CM | POA: Diagnosis not present

## 2017-07-07 DIAGNOSIS — H4312 Vitreous hemorrhage, left eye: Secondary | ICD-10-CM | POA: Diagnosis not present

## 2017-07-07 DIAGNOSIS — E1142 Type 2 diabetes mellitus with diabetic polyneuropathy: Secondary | ICD-10-CM | POA: Diagnosis not present

## 2017-07-07 DIAGNOSIS — R0602 Shortness of breath: Secondary | ICD-10-CM

## 2017-07-07 DIAGNOSIS — Z8249 Family history of ischemic heart disease and other diseases of the circulatory system: Secondary | ICD-10-CM | POA: Diagnosis not present

## 2017-07-07 DIAGNOSIS — E1122 Type 2 diabetes mellitus with diabetic chronic kidney disease: Secondary | ICD-10-CM | POA: Diagnosis not present

## 2017-07-07 DIAGNOSIS — R51 Headache: Secondary | ICD-10-CM | POA: Diagnosis not present

## 2017-07-07 LAB — CBC WITH DIFFERENTIAL/PLATELET
Basophils Absolute: 0 10*3/uL (ref 0.0–0.1)
Basophils Relative: 0 %
EOS ABS: 0.3 10*3/uL (ref 0.0–0.7)
EOS PCT: 4 %
HCT: 32.9 % — ABNORMAL LOW (ref 36.0–46.0)
Hemoglobin: 10.3 g/dL — ABNORMAL LOW (ref 12.0–15.0)
LYMPHS ABS: 1.6 10*3/uL (ref 0.7–4.0)
Lymphocytes Relative: 24 %
MCH: 28.4 pg (ref 26.0–34.0)
MCHC: 31.3 g/dL (ref 30.0–36.0)
MCV: 90.6 fL (ref 78.0–100.0)
MONOS PCT: 5 %
Monocytes Absolute: 0.3 10*3/uL (ref 0.1–1.0)
Neutro Abs: 4.5 10*3/uL (ref 1.7–7.7)
Neutrophils Relative %: 67 %
PLATELETS: 215 10*3/uL (ref 150–400)
RBC: 3.63 MIL/uL — ABNORMAL LOW (ref 3.87–5.11)
RDW: 16.7 % — ABNORMAL HIGH (ref 11.5–15.5)
WBC: 6.7 10*3/uL (ref 4.0–10.5)

## 2017-07-07 LAB — BASIC METABOLIC PANEL
Anion gap: 13 (ref 5–15)
BUN: 22 mg/dL — AB (ref 6–20)
CHLORIDE: 95 mmol/L — AB (ref 101–111)
CO2: 29 mmol/L (ref 22–32)
Calcium: 8.8 mg/dL — ABNORMAL LOW (ref 8.9–10.3)
Creatinine, Ser: 3.83 mg/dL — ABNORMAL HIGH (ref 0.44–1.00)
GFR calc Af Amer: 15 mL/min — ABNORMAL LOW (ref >60)
GFR calc non Af Amer: 13 mL/min — ABNORMAL LOW (ref >60)
GLUCOSE: 131 mg/dL — AB (ref 65–99)
POTASSIUM: 3.8 mmol/L (ref 3.5–5.1)
Sodium: 137 mmol/L (ref 135–145)

## 2017-07-07 LAB — PHOSPHORUS: Phosphorus: 3.5 mg/dL (ref 2.5–4.6)

## 2017-07-07 LAB — GLUCOSE, CAPILLARY: GLUCOSE-CAPILLARY: 154 mg/dL — AB (ref 65–99)

## 2017-07-07 LAB — LIPID PANEL
CHOL/HDL RATIO: 8.1 ratio
Cholesterol: 243 mg/dL — ABNORMAL HIGH (ref 0–200)
HDL: 30 mg/dL — AB (ref >40)
LDL Cholesterol: UNDETERMINED mg/dL (ref 0–99)
Triglycerides: 402 mg/dL — ABNORMAL HIGH (ref <150)
VLDL: UNDETERMINED mg/dL (ref 0–40)

## 2017-07-07 LAB — I-STAT TROPONIN, ED
Troponin i, poc: 0.04 ng/mL (ref 0.00–0.08)
Troponin i, poc: 0.06 ng/mL (ref 0.00–0.08)

## 2017-07-07 LAB — MAGNESIUM: Magnesium: 1.8 mg/dL (ref 1.7–2.4)

## 2017-07-07 LAB — TROPONIN I: TROPONIN I: 0.06 ng/mL — AB (ref <0.03)

## 2017-07-07 LAB — BRAIN NATRIURETIC PEPTIDE: B NATRIURETIC PEPTIDE 5: 901.7 pg/mL — AB (ref 0.0–100.0)

## 2017-07-07 LAB — TSH: TSH: 1.483 u[IU]/mL (ref 0.350–4.500)

## 2017-07-07 MED ORDER — ADULT MULTIVITAMIN W/MINERALS CH
1.0000 | ORAL_TABLET | Freq: Every day | ORAL | Status: DC
  Administered 2017-07-08 – 2017-07-09 (×2): 1 via ORAL
  Filled 2017-07-07 (×2): qty 1

## 2017-07-07 MED ORDER — PRAMIPEXOLE DIHYDROCHLORIDE 0.25 MG PO TABS
0.5000 mg | ORAL_TABLET | Freq: Every day | ORAL | Status: DC
  Administered 2017-07-07 – 2017-07-08 (×2): 0.5 mg via ORAL
  Filled 2017-07-07 (×3): qty 2

## 2017-07-07 MED ORDER — ASPIRIN 81 MG PO CHEW
81.0000 mg | CHEWABLE_TABLET | Freq: Every day | ORAL | Status: DC
  Administered 2017-07-08 – 2017-07-09 (×2): 81 mg via ORAL
  Filled 2017-07-07 (×2): qty 1

## 2017-07-07 MED ORDER — FUROSEMIDE 10 MG/ML IJ SOLN
40.0000 mg | Freq: Once | INTRAMUSCULAR | Status: AC
  Administered 2017-07-07: 40 mg via INTRAVENOUS
  Filled 2017-07-07: qty 4

## 2017-07-07 MED ORDER — LISINOPRIL 40 MG PO TABS
40.0000 mg | ORAL_TABLET | Freq: Every day | ORAL | Status: DC
  Administered 2017-07-08: 40 mg via ORAL
  Filled 2017-07-07: qty 1
  Filled 2017-07-07: qty 2

## 2017-07-07 MED ORDER — DULOXETINE HCL 30 MG PO CPEP
30.0000 mg | ORAL_CAPSULE | Freq: Two times a day (BID) | ORAL | Status: DC
  Administered 2017-07-07 – 2017-07-09 (×4): 30 mg via ORAL
  Filled 2017-07-07 (×4): qty 1

## 2017-07-07 MED ORDER — ASPIRIN EC 325 MG PO TBEC: 325.0000 mg | DELAYED_RELEASE_TABLET | Freq: Once | ORAL | Status: DC

## 2017-07-07 MED ORDER — ATORVASTATIN CALCIUM 80 MG PO TABS
80.0000 mg | ORAL_TABLET | Freq: Every day | ORAL | Status: DC
  Administered 2017-07-08 – 2017-07-09 (×2): 80 mg via ORAL
  Filled 2017-07-07: qty 2
  Filled 2017-07-07: qty 1

## 2017-07-07 MED ORDER — GABAPENTIN 100 MG PO CAPS
100.0000 mg | ORAL_CAPSULE | Freq: Every day | ORAL | Status: DC
  Administered 2017-07-07 – 2017-07-08 (×2): 100 mg via ORAL
  Filled 2017-07-07 (×2): qty 1

## 2017-07-07 MED ORDER — ACETAMINOPHEN 325 MG PO TABS: 650.0000 mg | ORAL_TABLET | ORAL | Status: DC | PRN

## 2017-07-07 MED ORDER — METOPROLOL SUCCINATE ER 25 MG PO TB24
25.0000 mg | ORAL_TABLET | Freq: Every day | ORAL | Status: DC
  Administered 2017-07-08: 25 mg via ORAL
  Filled 2017-07-07 (×2): qty 1

## 2017-07-07 MED ORDER — LABETALOL HCL 200 MG PO TABS
200.0000 mg | ORAL_TABLET | Freq: Once | ORAL | Status: AC
  Administered 2017-07-08: 200 mg via ORAL
  Filled 2017-07-07: qty 1

## 2017-07-07 MED ORDER — INSULIN GLARGINE 100 UNIT/ML ~~LOC~~ SOLN
18.0000 [IU] | Freq: Every day | SUBCUTANEOUS | Status: DC
  Administered 2017-07-07: 18 [IU] via SUBCUTANEOUS
  Filled 2017-07-07 (×5): qty 0.18

## 2017-07-07 MED ORDER — INSULIN ASPART 100 UNIT/ML ~~LOC~~ SOLN
0.0000 [IU] | SUBCUTANEOUS | Status: DC
  Administered 2017-07-08: 2 [IU] via SUBCUTANEOUS
  Administered 2017-07-08 (×2): 1 [IU] via SUBCUTANEOUS
  Administered 2017-07-08: 2 [IU] via SUBCUTANEOUS
  Administered 2017-07-08: 1 [IU] via SUBCUTANEOUS

## 2017-07-07 MED ORDER — OXYCODONE HCL 5 MG PO TABS
10.0000 mg | ORAL_TABLET | Freq: Four times a day (QID) | ORAL | Status: DC | PRN
  Filled 2017-07-07: qty 2

## 2017-07-07 MED ORDER — ONDANSETRON HCL 4 MG/2ML IJ SOLN: 4.0000 mg | Freq: Four times a day (QID) | INTRAMUSCULAR | Status: DC | PRN

## 2017-07-07 MED ORDER — HEPARIN SODIUM (PORCINE) 5000 UNIT/ML IJ SOLN
5000.0000 [IU] | Freq: Three times a day (TID) | INTRAMUSCULAR | Status: DC
  Administered 2017-07-07 – 2017-07-09 (×4): 5000 [IU] via SUBCUTANEOUS
  Filled 2017-07-07 (×4): qty 1

## 2017-07-07 MED ORDER — CINACALCET HCL 30 MG PO TABS
90.0000 mg | ORAL_TABLET | Freq: Every day | ORAL | Status: DC
  Administered 2017-07-08 – 2017-07-09 (×2): 90 mg via ORAL
  Filled 2017-07-07 (×2): qty 3

## 2017-07-07 MED ORDER — AMLODIPINE BESYLATE 10 MG PO TABS
10.0000 mg | ORAL_TABLET | Freq: Every day | ORAL | Status: DC
  Administered 2017-07-08: 10 mg via ORAL
  Filled 2017-07-07: qty 1

## 2017-07-07 MED ORDER — RENAPLEX-D PO TABS: 1.0000 | ORAL_TABLET | Freq: Every day | ORAL | Status: DC

## 2017-07-07 NOTE — ED Triage Notes (Signed)
Patient BIB EMS from PCP office. Patient went into see her PCP for intermittent SOB and diaphoresis last night into today. PCP wants patient worked up for possible MI. EKG was WNL for EMS. Patient also reports "bleeding behind her left eye", right hand numbness, and left sided headache. Patient had EKG and 324mg  PO ASA by PCP. Patient had dialysis this morning. Patient denies chest pain/SOB at this time. 20G PIV Right forearm started by EMS.

## 2017-07-07 NOTE — H&P (Signed)
History and Physical    [name] DZH:[id number] DOB: [birth year] DOA: 07/07/2017  PCP: Tereasa Coop, PA-C Patient coming from: Home  I have personally briefly reviewed patient's old medical records in [place]  Chief Complaint: Headache, right arm pressure  HPI: [name] is a 52 y.o. female with medical history significant for ESRD on TTS HD via left upper extremity AV fistula, hypertension, insulin-dependent type 2 diabetes mellitus, peripheral vascular disease with multiple prior amputations, prior history of pericardial effusion status post pericardial window in 2006, and recently diagnosed vitreous hemorrhage who presents to the ED after her dialysis session with complaint of left-sided orbital headache and right arm pressure.  She notes that she has been experiencing short episodes of right arm pressure that last less than 10 seconds.  Most recent episode occurred on the day of presentation after her dialysis session.  She states that these episodes are unpredictable, but does note a recent episode within the last 2 days that occurred with exertion, associated with shortness of breath and diaphoresis.  She has not taken aspirin or nitroglycerin.  She is unsure of any exacerbating or remitting factors.  She has no known personal history of coronary disease. She reports family history of heart disease in her father in his late 56s or early 78s. She is a daily ppd smoker. She tolerated dialysis today without any issue. She notes mild swelling of her feet, denies orthopnea or exertional dyspnea. No new medication changes. TTE obtained on 06/29/2017 showed severe concentric LVH without wall motion abnormalities, EF of 41-96% and diastolic dysfunction; no valvular disease noted.  In regards to her vitreous hemorrhage, she reports that she saw ophthalmology within the past week and was diagnosed with a spontaneous vitreous hemorrhage.  She notes decreased vision in her left eye and  left-sided headache, but denies pain with extraocular movements.  She denies fever, chills, rhinorrhea, or other infectious symptoms. She has a scheduled appointment with a retina specialist tomorrow (6/7).  ED Course: In the ED, pt is afebrile, tachycardic to the low 100s and hypertensive to 222L systolic. She is comfortable on room air. Labs are notable for WBC 6.7, Hgb 10.3, BNP 902 (no baseline for comparison), troponin 0.04 initially, 0.06 on recheck x2. EKG shows sinus rhythm with PACs and PVCs, RBBB no ST-T changes from her baseline. CXR showed cardiomegaly with mild vascular congestion. Recent TTE findings are noted above.  Review of Systems: As per HPI otherwise 10 point review of systems negative.   Past Medical History:  Diagnosis Date  . Anemia   . Arthritis    "knees"  . ESRD (end stage renal disease) ([place])   . Headache(784.0)   . High cholesterol   . Hypertension   . 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 Pericardial effusion   . PVD (peripheral vascular disease) ([place])    Right leg stent in [place].  (No records)  . Type II diabetes mellitus ([place])     Past Surgical History:  Procedure Laterality Date  . [place]; 1997  . PERICARDIAL WINDOW  02/2004   for pericardial effusion  . TUBAL LIGATION  05/1995     reports that she has been smoking cigarettes.  She has a 20.00 pack-year smoking history. She has never used smokeless tobacco. She reports that she does not drink alcohol or use drugs.  No Known Allergies  Family History  Problem Relation Age of Onset  .  Cancer Brother   . Heart disease Father        Died age 24  . Diabetes Father   . Hyperlipidemia Father   . Hypertension Father   . Stroke Father   . Diabetes Mother   . Hypertension Mother   . Stroke Mother   . Diabetes Sister   . Diabetes Brother   . Hypertension Sister   . Hypertension Brother     Prior to Admission medications   Medication Sig Start Date  End Date Taking? Authorizing Provider  amLODipine (NORVASC) 5 MG tablet TK 1 T PO BID 04/28/17  Yes [provider]  atorvastatin (LIPITOR) 80 MG tablet Take 1 tablet (80 mg total) by mouth daily. Take 1/2 tab daily for one week, then increase to the full tab. 06/01/17  Yes Tereasa Coop, PA-C  cinacalcet (SENSIPAR) 90 MG tablet Take 90 mg by mouth daily. 05/23/17  Yes [provider]  DULoxetine (CYMBALTA) 30 MG capsule Take 30 mg by mouth 2 (two) times daily.   Yes [provider]  gabapentin (NEURONTIN) 100 MG capsule TK ONE C PO D 04/28/17  Yes [provider]  HUMALOG MIX 75/25 KWIKPEN (75-25) 100 UNIT/ML Kwikpen Inject 25-35 Units into the skin 2 (two) times daily. Sliding Scale 06/27/17  Yes [provider]  hydrOXYzine (ATARAX/VISTARIL) 25 MG tablet TK 1 T PO Q 12 H PRN 05/12/17  Yes [provider]  lisinopril (PRINIVIL,ZESTRIL) 40 MG tablet TK 1 T PO D 03/17/17  Yes [provider]  metoprolol succinate (TOPROL-XL) 25 MG 24 hr tablet Take 1 tablet (25 mg total) by mouth daily. 06/01/17  Yes Tereasa Coop, PA-C  Multiple Vitamins-Minerals (RENAPLEX-D) TABS Take 1 tablet by mouth daily. 06/30/17  Yes [provider]  oxyCODONE-acetaminophen (PERCOCET) 10-325 MG tablet TK 1 T PO QID PRF CHRONIC LUMBAR PAIN 05/16/17  Yes [provider]  pramipexole (MIRAPEX) 0.5 MG tablet TK 1 T PO UTD 05/18/17  Yes [provider]    Physical Exam: Vitals:   07/07/17 1830 07/07/17 1845 07/07/17 2000 07/07/17 2112  BP: (!) 155/75 (!) 112/94 (!) 168/85 (!) 180/85  Pulse: 82 80 87 80  Resp: 15 20 18 18   Temp:    97.8 F (36.6 C)  TempSrc:    Oral  SpO2: 97% 100% 98% 97%  Weight:    110.2 kg (243 lb)  Height:    5\' 8"  (1.727 m)    Constitutional: NAD, calm, comfortable Eyes: PERRL, lids and conjunctivae normal ENMT: Mucous membranes are moist. Posterior pharynx clear of any exudate or lesions.  Neck: normal, supple,  no masses Respiratory: decreased bibasilar, otherwise CTA, no increased WOB Cardiovascular: Regular rate and rhythm, no murmurs / rubs / gallops. No extremity edema. 2+ pedal pulses. Palpable bruit, audible thrill of LUE AVF. Abdomen: no tenderness, no masses palpated.  Bowel sounds positive.  Musculoskeletal: no clubbing / cyanosis. Multiple amputations. Skin: Skin graft on RLE, circular 2 cm ulcer on RLE. Neurologic: CN 2-12 grossly intact. Sensation intact, DTR normal. Strength 5/5 in all 4.  Psychiatric: Normal judgment and insight. Alert and oriented x 3. Normal mood.    Labs on Admission: I have personally reviewed following labs and imaging studies  CBC: Recent Labs  Lab 07/07/17 1532  WBC 6.7  NEUTROABS 4.5  HGB 10.3*  HCT 32.9*  MCV 90.6  PLT 270   Basic Metabolic Panel: Recent Labs  Lab 07/07/17 1532 07/07/17 2107  NA 137  --  K 3.8  --   CL 95*  --   CO2 29  --   GLUCOSE 131*  --   BUN 22*  --   CREATININE 3.83*  --   CALCIUM 8.8*  --   MG  --  1.8  PHOS  --  3.5   GFR: Estimated Creatinine Clearance: 22.4 mL/min (A) (by C-G formula based on SCr of 3.83 mg/dL (H)). Liver Function Tests: No results for input(s): AST, ALT, ALKPHOS, BILITOT, PROT, ALBUMIN in the last 168 hours. No results for input(s): LIPASE, AMYLASE in the last 168 hours. No results for input(s): AMMONIA in the last 168 hours. Coagulation Profile: No results for input(s): INR, PROTIME in the last 168 hours. Cardiac Enzymes: Recent Labs  Lab 07/07/17 2107  TROPONINI 0.06*   BNP (last 3 results) No results for input(s): PROBNP in the last 8760 hours. HbA1C: No results for input(s): HGBA1C in the last 72 hours. CBG: No results for input(s): GLUCAP in the last 168 hours. Lipid Profile: No results for input(s): CHOL, HDL, LDLCALC, TRIG, CHOLHDL, LDLDIRECT in the last 72 hours. Thyroid Function Tests: Recent Labs    07/07/17 2107  TSH 1.483   Anemia Panel: No results for  input(s): VITAMINB12, FOLATE, FERRITIN, TIBC, IRON, RETICCTPCT in the last 72 hours. Urine analysis:    Component Value Date/Time   COLORURINE YELLOW 07/22/2011 1526   APPEARANCEUR CLOUDY (A) 07/22/2011 1526   LABSPEC 1.022 07/22/2011 1526   PHURINE 5.5 07/22/2011 1526   GLUCOSEU 100 (A) 07/22/2011 1526   HGBUR MODERATE (A) 07/22/2011 1526   BILIRUBINUR NEGATIVE 07/22/2011 1526   BILIRUBINUR NEG 07/19/2011 1202   KETONESUR NEGATIVE 07/22/2011 1526   PROTEINUR >300 (A) 07/22/2011 1526   UROBILINOGEN 0.2 07/22/2011 1526   NITRITE NEGATIVE 07/22/2011 1526   LEUKOCYTESUR TRACE (A) 07/22/2011 1526    Radiological Exams on Admission: Dg Chest 2 View  Result Date: 07/07/2017 CLINICAL DATA:  Shortness of breath and chest pain. EXAM: CHEST - 2 VIEW COMPARISON:  01/29/2011 FINDINGS: The cardio pericardial silhouette is enlarged. There is pulmonary vascular congestion without overt pulmonary edema. The visualized bony structures of the thorax are intact. Telemetry leads overlie the chest. IMPRESSION: Cardiomegaly with vascular congestion. Possible associated component of interstitial edema. Electronically Signed   By: Misty Stanley M.D.   On: 07/07/2017 16:26   Ct Head Wo Contrast  Result Date: 07/07/2017 CLINICAL DATA:  Headache. "Bleeding behind her left eye". Right hand numbness. EXAM: CT HEAD WITHOUT CONTRAST TECHNIQUE: Contiguous axial images were obtained from the base of the skull through the vertex without intravenous contrast. COMPARISON:  January 29, 2011 FINDINGS: Brain: No subdural, epidural, or subarachnoid hemorrhage. Cerebellum, brainstem, and basal cisterns are normal. Ventricles and sulci are normal. Scattered white matter changes. No acute cortical ischemia or infarct. No mass effect or midline shift. Vascular: Calcified atherosclerosis in the intracranial carotids. Skull: Normal. Negative for fracture or focal lesion. Sinuses/Orbits: No acute finding. Other: None. IMPRESSION: No  acute intracranial abnormality.  Scattered white matter changes. Electronically Signed   By: Dorise Bullion III M.D   On: 07/07/2017 16:59    EKG: Independently reviewed. Sinus rhythm. PACs and PVCs. RBBB. No new ST-T changes.  Assessment/Plan Active Problems:   Chest pain  Right arm pressure, possible anginal equivalent Pt has multiple risk factors for CAD (family history, smoking, diabetes, obesity). Troponin elevation is very mild, particularly in light of ESRD status. Symptoms are clearly atypical and likely non-cardiac, however, HEAR score is elevated  and reasonable to trend troponin and pursue further cardiac imaging as inpatient or outpatient. Despite BNP elevation, she is not significantly hypervolemic on my exam -- doubt heart failure exacerbation. - ASA 325 mg once, start ASA 81 mg daily given high risk profile - No need to repeat TTE as done on 5/29 - Consider NM stress testing in AM versus outpatient - Will make NPO at midnight - Monitor on telemetry - Continue home statin, beta blockade, ACE - EKG PRN for chest pain - Risk stratification labs - Daily BMP, Mg  Left vitreous hemorrhage - Supportive care, pain control - Follow up with Ophthalmology as outpatient  ESRD on TTS HD via LUE AV fistula - No acute need for HD - Strict I/O, daily weights - Avoid nephrotoxins - Continue cinacalcet per home - Daily BMP, Mg, PO4  HTN - Continue Toprol, lisinopril, amlodipine  DM2 with peripheral neuropathy - Lantus 18 U QHS - SSI - FSBS Q4H - Continue gabapentin, Cymbalta  DVT prophylaxis: SQH Code Status: Full Disposition Plan: Home tomorrow Consults called: None Admission status: OBS, Telemetry   Vasilis Luhman Sharene Butters MD [facility]  If 7PM-7AM, please contact night-coverage [email or web address] Password [facility]  07/07/2017, 10:34 PM

## 2017-07-07 NOTE — Progress Notes (Signed)
07/07/2017 4:03 PM   DOB: May 02, 1965 / MRN: [id number]  SUBJECTIVE:  [name] is a 52 y.o. female presenting for right-sided chest pain shortness of breath.  Both symptoms started yesterday.  Patient describes a pain as a pulling type sensation.  She has a long history of poorly controlled diabetes and we have been working together in the last few months to achieve better control.  Her daily blood glucose checks have been running in the 100s most recently.  She is highly medication compliant and has history of CKD on dialysis and does not miss appointments.  She associates some mild  diaphoresis that started this morning.  She does not feel the symptoms are getting better or worse at this time.  She did not drive here.  She also has a history of CHF and has been seen by: Heart care recently and was found to have a grade 1 diastolic dysfunction.  She was also managed by chronic pain secondary to a long history of low back pain.  She has No Known Allergies.   She  has a past medical history of Anemia, Arthritis, ESRD (end stage renal disease) ([place]), Headache(784.0), High cholesterol, Hypertension, Nerve pain, Pericardial effusion, PVD (peripheral vascular disease) ([place]), and Type II diabetes mellitus ([place]).    She  reports that she has been smoking cigarettes.  She has a 20.00 pack-year smoking history. She has never used smokeless tobacco. She reports that she does not drink alcohol or use drugs. She  reports that she does not currently engage in sexual activity. The patient  has a past surgical history that includes Pericardial window (02/2004); Tubal ligation (05/1995); and Cesarean section (1996; 1997).  Her family history includes Cancer in her brother; Diabetes in her brother, father, mother, and sister; Heart disease in her father; Hyperlipidemia in her father; Hypertension in her brother, father, mother, and sister; Stroke in her father and mother.  Review of Systems  Constitutional:  Positive for diaphoresis and malaise/fatigue. Negative for chills and fever.  Respiratory: Positive for shortness of breath. Negative for cough.   Cardiovascular: Positive for chest pain. Negative for leg swelling.  Genitourinary: Negative for dysuria.  Neurological: Positive for headaches. Negative for dizziness.    The problem list and medications were reviewed and updated by myself where necessary and exist elsewhere in the encounter.   OBJECTIVE:  BP (!) 152/78   Pulse (!) 103   Temp 98.2 F (36.8 C) (Oral)   Resp 16   Ht 5\' 8"  (1.727 m)   Wt 243 lb 3.2 oz (110.3 kg)   LMP 03/28/2011   SpO2 94%   BMI 36.98 kg/m   Wt Readings from Last 3 Encounters:  07/07/17 243 lb (110.2 kg)  07/07/17 243 lb 3.2 oz (110.3 kg)  06/24/17 242 lb 12.8 oz (110.1 kg)   Temp Readings from Last 3 Encounters:  07/07/17 98.2 F (36.8 C) (Oral)  06/24/17 98.4 F (36.9 C) (Oral)  06/01/17 98.4 F (36.9 C) (Oral)   BP Readings from Last 3 Encounters:  07/07/17 (!) 152/78  06/24/17 124/64  06/17/17 126/68   Pulse Readings from Last 3 Encounters:  07/07/17 (!) 103  06/24/17 89  06/17/17 89     Physical Exam  Constitutional: She is oriented to person, place, and time. She appears well-nourished. No distress. She is not intubated.  HENT:  Head: Normocephalic.  Eyes: Pupils are equal, round, and reactive to light. EOM are normal. No scleral icterus.  Cardiovascular: Normal  rate, regular rhythm, S1 normal, S2 normal, normal heart sounds and intact distal pulses. Exam reveals no gallop, no friction rub and no decreased pulses.  No murmur heard. Pulmonary/Chest: Effort normal and breath sounds normal. No accessory muscle usage or stridor. No apnea, no tachypnea and no bradypnea. She is not intubated. No respiratory distress. She has no wheezes. She has no rales. She exhibits no tenderness.    Abdominal: She exhibits no distension.  Musculoskeletal: She exhibits no edema, tenderness or  deformity.  Neurological: She is alert and oriented to person, place, and time. She displays normal reflexes. No cranial nerve deficit or sensory deficit. She exhibits normal muscle tone. Coordination and gait normal.  Skin: Skin is dry. She is not diaphoretic.  Psychiatric: She has a normal mood and affect.  Vitals reviewed.   No results found for this or any previous visit (from the past 72 hour(s)).  No results found.  ASSESSMENT AND PLAN:  Taylyn was seen today for tingling and shortness of breath.  Diagnoses and all orders for this visit:  Atypical chest pain: Given patient's complaint of right-sided chest pain with shortness of breath and her history of poorly controlled diabetes, CKD, mild cardiomyopathy we have sent her to the emergency department for further evaluation. -     EKG 12-Lead  Shortness of breath -     EKG 12-Lead    The patient is advised to call or return to clinic if she does not see an improvement in symptoms, or to seek the care of the closest emergency department if she worsens with the above plan.   Philis Fendt, MHS, PA-C [facility] 07/07/2017 4:03 PM

## 2017-07-07 NOTE — ED Notes (Signed)
PATIENT O2 STATS 94% while walking in ED

## 2017-07-07 NOTE — Patient Instructions (Signed)
     IF you received an x-ray today, you will receive an invoice from [facility]. Please contact [facility] at [phone] with questions or concerns regarding your invoice.   IF you received labwork today, you will receive an invoice from [facility]. Please contact [facility] at [phone] with questions or concerns regarding your invoice.   Our billing staff will not be able to assist you with questions regarding bills from these companies.  You will be contacted with the lab results as soon as they are available. The fastest way to get your results is to activate your My Chart account. Instructions are located on the last page of this paperwork. If you have not heard from us regarding the results in 2 weeks, please contact this office.     

## 2017-07-07 NOTE — ED Notes (Signed)
Bed: WA09 Expected date:  Expected time:  Means of arrival:  Comments: EMS wheezing

## 2017-07-07 NOTE — ED Provider Notes (Addendum)
[facility] DEPT Provider Note   CSN: [id number] Arrival date & time: 07/07/17  1455     History   Chief Complaint Chief Complaint  Patient presents with  . Shortness of Breath  . Eye Problem  . Headache    HPI [name] is a 52 y.o. female.  HPI   52 year old female with history of diabetes, end-stage renal disease currently on T-Th-Sat dialysis, anemia, brought here via EMS from PCP office for shortness of breath.  Patient report 3 to 4 days ago she was having vision changes involving her left eye.  She felt as if blood were obscuring her vision.  She went to see her eye specialist and they confirm she has blood in the back of her eye.  She is scheduled to be's seen by a retina specialist in the near future.  She endorsed having recurrent nagging left-sided headache since that incident.  Headache is waxing waning and mild currently.  She does not normally have headaches.  For the past 2 days she also experienced a pressure tightness sensation to her right upper arm, described as "blood pressure cuff squeezing," which has also waxed and waned.  Last night she had some mild chest discomfort along with shortness of breath and diaphoresis upon walking short distance which is new.  She went to finish dialysis appointment today and due to the recurrence of symptoms was seen at the PCP office.  She is sent here for further care.  She is a smoker and smokes approximately a quarter of a pack a day.  Denies alcohol abuse.  Denies any change in her medication, she has been compliant with dialysis.  She did report having some residual swelling in her left leg for the past several days despite finishing her dialysis.  She denies any prior history of PE DVT, no recent surgery, prolonged bed rest, active cancer or hemoptysis.  She denies any active chest pain or shortness of breath at this time. Pt did received 324mg  ASA PTA.    Past Medical History:  Diagnosis Date    . Anemia   . Arthritis    "knees"  . ESRD (end stage renal disease) ([place])   . Headache(784.0)   . High cholesterol   . Hypertension   . 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 Pericardial effusion   . PVD (peripheral vascular disease) ([place])    Right leg stent in [place].  (No records)  . Type II diabetes mellitus [facility])     Patient Active Problem List   Diagnosis Date Noted  . Left ventricular hypertrophy 06/17/2017  . ESRD (end stage renal disease) ([place]) 05/21/2011  . Diabetic retinopathy 05/21/2011  . Diastolic dysfunction 48/88/9169  . Hyperlipidemia, mixed 01/30/2011  . Type II diabetes mellitus with complication, uncontrolled ([place]) 01/29/2011  . Neuropathy 01/29/2011  . Hypertension     Past Surgical History:  Procedure Laterality Date  . [place]; 1997  . PERICARDIAL WINDOW  02/2004   for pericardial effusion  . TUBAL LIGATION  05/1995     OB History   None      Home Medications    Prior to Admission medications   Medication Sig Start Date End Date Taking? Authorizing Provider  amLODipine (NORVASC) 5 MG tablet TK 1 T PO BID 04/28/17   [provider]  atorvastatin (LIPITOR) 80 MG tablet Take 1 tablet (80 mg total) by mouth daily. Take  1/2 tab daily for one week, then increase to the full tab. 06/01/17   Tereasa Coop, PA-C  cinacalcet (SENSIPAR) 90 MG tablet Take 90 mg by mouth daily. 05/23/17   [provider]  DULoxetine (CYMBALTA) 30 MG capsule Take 30 mg by mouth 2 (two) times daily.    [provider]  gabapentin (NEURONTIN) 100 MG capsule TK ONE C PO D 04/28/17   [provider]  hydrOXYzine (ATARAX/VISTARIL) 25 MG tablet TK 1 T PO Q 12 H PRN 05/12/17   [provider]  lisinopril (PRINIVIL,ZESTRIL) 40 MG tablet TK 1 T PO D 03/17/17   [provider]  metoprolol succinate (TOPROL-XL) 25 MG 24 hr tablet Take 1 tablet (25 mg total) by mouth daily. 06/01/17   Tereasa Coop, PA-C  oxyCODONE-acetaminophen (PERCOCET) 10-325 MG tablet TK 1 T PO QID PRF CHRONIC LUMBAR PAIN 05/16/17   [provider]  pramipexole (MIRAPEX) 0.5 MG tablet TK 1 T PO UTD 05/18/17   [provider]  PROAIR HFA 108 (90 Base) MCG/ACT inhaler INHALE 1 TO 2 PFS PO Q 4 TO 6 H COU 03/17/17   [provider]    Family History Family History  Problem Relation Age of Onset  . Cancer Brother   . Heart disease Father        Died age 26  . Diabetes Father   . Hyperlipidemia Father   . Hypertension Father   . Stroke Father   . Diabetes Mother   . Hypertension Mother   . Stroke Mother   . Diabetes Sister   . Diabetes Brother   . Hypertension Sister   . Hypertension Brother     Social History Social History   Tobacco Use  . Smoking status: Current Every Day Smoker    Packs/day: 1.00    Years: 20.00    Pack years: 20.00    Types: Cigarettes  . Smokeless tobacco: Never Used  . Tobacco comment: She will try.    Substance Use Topics  . Alcohol use: No  . Drug use: No     Allergies   Patient has no known allergies.   Review of Systems Review of Systems  All other systems reviewed and are negative.    Physical Exam Updated Vital Signs BP (!) 156/79   Pulse 78   Resp 19   Wt 110.2 kg (243 lb)   LMP 03/28/2011   SpO2 98%   BMI 36.95 kg/m   Physical Exam  Constitutional: She is oriented to person, place, and time. She appears well-developed and well-nourished. No distress.  HENT:  Head: Normocephalic and atraumatic.  Eyes: Pupils are equal, round, and reactive to light. Conjunctivae and EOM are normal.  Neck: Neck supple.  No nuchal rigidity  Cardiovascular: Normal rate and regular rhythm.  Murmur heard. Pulmonary/Chest: Effort normal and breath sounds normal. She has no wheezes. She has no rales.  Abdominal: Soft. There is no tenderness.  Musculoskeletal: Normal range of motion.       Right lower leg: She exhibits no tenderness  and no edema.       Left lower leg: She exhibits no tenderness and no edema.  Neurological: She is alert and oriented to person, place, and time. She has normal strength. No cranial nerve deficit or sensory deficit. GCS eye subscore is 4. GCS verbal subscore is 5. GCS motor subscore is 6.  Skin: Skin is warm. No rash noted.  Psychiatric: She has a normal  mood and affect.  Nursing note and vitals reviewed.    ED Treatments / Results  Labs (all labs ordered are listed, but only abnormal results are displayed) Labs Reviewed  CBC WITH DIFFERENTIAL/PLATELET - Abnormal; Notable for the following components:      Result Value   RBC 3.63 (*)    Hemoglobin 10.3 (*)    HCT 32.9 (*)    RDW 16.7 (*)    All other components within normal limits  BASIC METABOLIC PANEL - Abnormal; Notable for the following components:   Chloride 95 (*)    Glucose, Bld 131 (*)    BUN 22 (*)    Creatinine, Ser 3.83 (*)    Calcium 8.8 (*)    GFR calc non Af Amer 13 (*)    GFR calc Af Amer 15 (*)    All other components within normal limits  BRAIN NATRIURETIC PEPTIDE - Abnormal; Notable for the following components:   B Natriuretic Peptide 901.7 (*)    All other components within normal limits  I-STAT TROPONIN, ED  I-STAT TROPONIN, ED    EKG EKG Interpretation  Date/Time:  Thursday July 07 2017 15:15:15 EDT Ventricular Rate:  85 PR Interval:    QRS Duration: 155 QT Interval:  445 QTC Calculation: 530 R Axis:   96 Text Interpretation:  Sinus rhythm Multiple premature complexes, vent & supraven IVCD, consider atypical RBBB new rbbb  like pattern Confirmed by Deno Etienne [phone]) on 07/07/2017 3:18:54 PM   Radiology Dg Chest 2 View  Result Date: 07/07/2017 CLINICAL DATA:  Shortness of breath and chest pain. EXAM: CHEST - 2 VIEW COMPARISON:  01/29/2011 FINDINGS: The cardio pericardial silhouette is enlarged. There is pulmonary vascular congestion without overt pulmonary edema. The visualized bony structures  of the thorax are intact. Telemetry leads overlie the chest. IMPRESSION: Cardiomegaly with vascular congestion. Possible associated component of interstitial edema. Electronically Signed   By: Misty Stanley M.D.   On: 07/07/2017 16:26   Ct Head Wo Contrast  Result Date: 07/07/2017 CLINICAL DATA:  Headache. "Bleeding behind her left eye". Right hand numbness. EXAM: CT HEAD WITHOUT CONTRAST TECHNIQUE: Contiguous axial images were obtained from the base of the skull through the vertex without intravenous contrast. COMPARISON:  January 29, 2011 FINDINGS: Brain: No subdural, epidural, or subarachnoid hemorrhage. Cerebellum, brainstem, and basal cisterns are normal. Ventricles and sulci are normal. Scattered white matter changes. No acute cortical ischemia or infarct. No mass effect or midline shift. Vascular: Calcified atherosclerosis in the intracranial carotids. Skull: Normal. Negative for fracture or focal lesion. Sinuses/Orbits: No acute finding. Other: None. IMPRESSION: No acute intracranial abnormality.  Scattered white matter changes. Electronically Signed   By: Dorise Bullion III M.D   On: 07/07/2017 16:59    Procedures Procedures (including critical care time)  Medications Ordered in ED Medications  furosemide (LASIX) injection 40 mg (40 mg Intravenous Given 07/07/17 1830)     Initial Impression / Assessment and Plan / ED Course  I have reviewed the triage vital signs and the nursing notes.  Pertinent labs & imaging results that were available during my care of the patient were reviewed by me and considered in my medical decision making (see chart for details).     BP (!) 156/79   Pulse 78   Resp 19   Wt 110.2 kg (243 lb)   LMP 03/28/2011   SpO2 98%   BMI 36.95 kg/m    Final Clinical Impressions(s) / ED Diagnoses   Final diagnoses:  Angina pectoris ([place])  Acute on chronic systolic congestive heart failure ([place])  Bad headache    ED Discharge Orders    None     3:36  PM Patient here for intermittent shortness of breath and chest discomfort since yesterday.  Also reports some right arm discomfort.  She does have risk factors for ACS and therefore cardiac work-up initiated.  No significant risk factor for PE  5:58 PM HEART score of 5, moderate risk of MACE.  Care discussed with DR. Tyrone Nine.   6:40 PM Patient currently resting comfortably in no acute discomfort.  Headache is minimal.  Labs remarkable for an elevated BNP of 901 without any prior value for comparison.  Chest x-ray shows vascular congestion and cardiomegaly.  She does make some urine therefore Lasix was given  Her initial troponin was 0.04, a repeat troponin is 0.06.  She remains chest pain-free but she will need further cardiac work-up including provocative testing.  Appreciate consultation from tried hospitalist, Dr. Shelton Silvas, who agrees to see and admit patient for further management.  Her head CT today without any acute changes.   Domenic Moras, PA-C 07/07/17 [place], Dan, DO 07/07/17 2017  ADDENDUM: CC time added  CRITICAL CARE Performed by: Domenic Moras Total critical care time: 35 minutes Critical care time was exclusive of separately billable procedures and treating other patients. Critical care was necessary to treat or prevent imminent or life-threatening deterioration. Critical care was time spent personally by me on the following activities: development of treatment plan with patient and/or surrogate as well as nursing, discussions with consultants, evaluation of patient's response to treatment, examination of patient, obtaining history from patient or surrogate, ordering and performing treatments and interventions, ordering and review of laboratory studies, ordering and review of radiographic studies, pulse oximetry and re-evaluation of patient's condition.    Domenic Moras, PA-C 07/16/17 [place], [place], DO 07/17/17 [phone]

## 2017-07-07 NOTE — ED Notes (Signed)
ED TO INPATIENT HANDOFF REPORT  Name/Age/Gender [name] 52 y.o. female  Code Status Code Status History    Date Active Date Inactive Code Status Order ID Comments User Context   01/30/2011 0047 02/01/2011 1910 Full Code [id number]  Champ Mungo, RN Inpatient      Home/SNF/Other Home  Chief Complaint [facility]   Level of Care/Admitting Diagnosis ED Disposition    ED Disposition Condition Comment   [place]: [facility] [[id number]]  Level of Care: Telemetry [5]  Admit to tele based on following criteria: Monitor for Ischemic changes  Diagnosis: Chest pain [160109]  Admitting Physician: Bennie Pierini [[id number]]  Attending Physician: Jonnie Finner, ADAM ROSS [[id number]]  PT Class (Do Not Modify): Observation [104]  PT Acc Code (Do Not Modify): Observation [10022]       Medical History Past Medical History:  Diagnosis Date  . Anemia   . Arthritis    "knees"  . ESRD (end stage renal disease) ([place])   . Headache(784.0)   . High cholesterol   . Hypertension   . 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 Pericardial effusion   . PVD (peripheral vascular disease) ([place])    Right leg stent in [place].  (No records)  . Type II diabetes mellitus (H[facility])     Allergies No Known Allergies  IV Location/Drains/Wounds Patient Lines/Drains/Airways Status   Active Line/Drains/Airways    Name:   Placement date:   Placement time:   Site:   Days:   Peripheral IV 07/07/17 Right Forearm   07/07/17    1829    Forearm   less than 1          Labs/Imaging Results for orders placed or performed during the hospital encounter of 07/07/17 (from the past 48 hour(s))  I-Stat Troponin, ED (not at [facility])     Status: None   Collection Time: 07/07/17  3:29 PM  Result Value Ref Range   Troponin i, poc 0.04 0.00 - 0.08 ng/mL   Comment 3            Comment: Due to the release kinetics of cTnI, a negative result within the first  hours of the onset of symptoms does not rule out myocardial infarction with certainty. If myocardial infarction is still suspected, repeat the test at appropriate intervals.   CBC with Differential/Platelet     Status: Abnormal   Collection Time: 07/07/17  3:32 PM  Result Value Ref Range   WBC 6.7 4.0 - 10.5 K/uL   RBC 3.63 (L) 3.87 - 5.11 MIL/uL   Hemoglobin 10.3 (L) 12.0 - 15.0 g/dL   HCT 32.9 (L) 36.0 - 46.0 %   MCV 90.6 78.0 - 100.0 fL   MCH 28.4 26.0 - 34.0 pg   MCHC 31.3 30.0 - 36.0 g/dL   RDW 16.7 (H) 11.5 - 15.5 %   Platelets 215 150 - 400 K/uL   Neutrophils Relative % 67 %   Neutro Abs 4.5 1.7 - 7.7 K/uL   Lymphocytes Relative 24 %   Lymphs Abs 1.6 0.7 - 4.0 K/uL   Monocytes Relative 5 %   Monocytes Absolute 0.3 0.1 - 1.0 K/uL   Eosinophils Relative 4 %   Eosinophils Absolute 0.3 0.0 - 0.7 K/uL   Basophils Relative 0 %   Basophils Absolute 0.0 0.0 - 0.1 K/uL    Comment: Performed at [facility], [address] [place],  [place] [address]  Basic metabolic panel     Status: Abnormal   Collection Time: 07/07/17  3:32 PM  Result Value Ref Range   Sodium 137 135 - 145 mmol/L   Potassium 3.8 3.5 - 5.1 mmol/L   Chloride 95 (L) 101 - 111 mmol/L   CO2 29 22 - 32 mmol/L   Glucose, Bld 131 (H) 65 - 99 mg/dL   BUN 22 (H) 6 - 20 mg/dL   Creatinine, Ser 3.83 (H) 0.44 - 1.00 mg/dL   Calcium 8.8 (L) 8.9 - 10.3 mg/dL   GFR calc non Af Amer 13 (L) >60 mL/min   GFR calc Af Amer 15 (L) >60 mL/min    Comment: (NOTE) The eGFR has been calculated using the CKD EPI equation. This calculation has not been validated in all clinical situations. eGFR's persistently <60 mL/min signify possible Chronic Kidney Disease.    Anion gap 13 5 - 15    Comment: Performed at [facility], [place] [address], [place], [place] [address]  Brain natriuretic peptide     Status: Abnormal   Collection Time: 07/07/17  3:33 PM  Result Value Ref Range   B Natriuretic  Peptide 901.7 (H) 0.0 - 100.0 pg/mL    Comment: Performed at [facility], [place] [address], [place], [place] [address]  I-Stat Troponin, ED (not at [facility])     Status: None   Collection Time: 07/07/17  6:07 PM  Result Value Ref Range   Troponin i, poc 0.06 0.00 - 0.08 ng/mL   Comment 3            Comment: Due to the release kinetics of cTnI, a negative result within the first hours of the onset of symptoms does not rule out myocardial infarction with certainty. If myocardial infarction is still suspected, repeat the test at appropriate intervals.    Dg Chest 2 View  Result Date: 07/07/2017 CLINICAL DATA:  Shortness of breath and chest pain. EXAM: CHEST - 2 VIEW COMPARISON:  01/29/2011 FINDINGS: The cardio pericardial silhouette is enlarged. There is pulmonary vascular congestion without overt pulmonary edema. The visualized bony structures of the thorax are intact. Telemetry leads overlie the chest. IMPRESSION: Cardiomegaly with vascular congestion. Possible associated component of interstitial edema. Electronically Signed   By: Misty Stanley M.D.   On: 07/07/2017 16:26   Ct Head Wo Contrast  Result Date: 07/07/2017 CLINICAL DATA:  Headache. "Bleeding behind her left eye". Right hand numbness. EXAM: CT HEAD WITHOUT CONTRAST TECHNIQUE: Contiguous axial images were obtained from the base of the skull through the vertex without intravenous contrast. COMPARISON:  January 29, 2011 FINDINGS: Brain: No subdural, epidural, or subarachnoid hemorrhage. Cerebellum, brainstem, and basal cisterns are normal. Ventricles and sulci are normal. Scattered white matter changes. No acute cortical ischemia or infarct. No mass effect or midline shift. Vascular: Calcified atherosclerosis in the intracranial carotids. Skull: Normal. Negative for fracture or focal lesion. Sinuses/Orbits: No acute finding. Other: None. IMPRESSION: No acute intracranial abnormality.  Scattered white matter changes.  Electronically Signed   By: Dorise Bullion III M.D   On: 07/07/2017 16:59    Pending Labs Unresulted Labs (From admission, onward)   Start     Ordered   Signed and Held  Magnesium  Add-on,   R     Signed and Held   Signed and Held  TSH  Add-on,   R     Signed and Held   Signed and [employer]  panel  Tomorrow morning,   R     Signed and Held   Signed and Held  Lipid panel  Add-on,   R     Signed and Held   Signed and Held  CBC  Tomorrow morning,   R     Signed and Held   Signed and Held  Troponin I  Now then every 6 hours,   STAT     Signed and Held   Signed and Held  Magnesium  Add-on,   R     Signed and Held   Signed and Held  Phosphorus  Add-on,   R     Signed and Held   Signed and Held  Magnesium  Tomorrow morning,   R     Signed and Held   Signed and Held  Phosphorus  Tomorrow morning,   R     Signed and Held      Vitals/Pain Today's Vitals   07/07/17 1700 07/07/17 1730 07/07/17 1830 07/07/17 1845  BP: (!) 161/82 (!) 167/65 (!) 155/75 (!) 112/94  Pulse: 83 79 82 80  Resp: 16 (!) 22 15 20   SpO2: 100% (!) 69% 97% 100%  Weight:        Isolation Precautions No active isolations  Medications Medications  furosemide (LASIX) injection 40 mg (40 mg Intravenous Given 07/07/17 1830)    Mobility walks

## 2017-07-08 ENCOUNTER — Ambulatory Visit (HOSPITAL_COMMUNITY)
Admit: 2017-07-08 | Discharge: 2017-07-08 | Disposition: A | Discharge status: Home / Self Care | Payer: Medicare Other | Attending: Medical | Admitting: Medical

## 2017-07-08 DIAGNOSIS — I129 Hypertensive chronic kidney disease with stage 1 through stage 4 chronic kidney disease, or unspecified chronic kidney disease: Secondary | ICD-10-CM | POA: Diagnosis not present

## 2017-07-08 DIAGNOSIS — R748 Abnormal levels of other serum enzymes: Secondary | ICD-10-CM

## 2017-07-08 DIAGNOSIS — N186 End stage renal disease: Secondary | ICD-10-CM | POA: Diagnosis not present

## 2017-07-08 DIAGNOSIS — R9431 Abnormal electrocardiogram [ECG] [EKG]: Secondary | ICD-10-CM | POA: Diagnosis not present

## 2017-07-08 DIAGNOSIS — E1169 Type 2 diabetes mellitus with other specified complication: Secondary | ICD-10-CM | POA: Diagnosis not present

## 2017-07-08 DIAGNOSIS — E669 Obesity, unspecified: Secondary | ICD-10-CM | POA: Diagnosis not present

## 2017-07-08 DIAGNOSIS — F17200 Nicotine dependence, unspecified, uncomplicated: Secondary | ICD-10-CM | POA: Diagnosis not present

## 2017-07-08 DIAGNOSIS — I739 Peripheral vascular disease, unspecified: Secondary | ICD-10-CM

## 2017-07-08 DIAGNOSIS — R0789 Other chest pain: Secondary | ICD-10-CM

## 2017-07-08 LAB — CBC
HEMATOCRIT: 30.8 % — AB (ref 36.0–46.0)
HEMOGLOBIN: 9.6 g/dL — AB (ref 12.0–15.0)
MCH: 28 pg (ref 26.0–34.0)
MCHC: 31.2 g/dL (ref 30.0–36.0)
MCV: 89.8 fL (ref 78.0–100.0)
Platelets: 207 10*3/uL (ref 150–400)
RBC: 3.43 MIL/uL — ABNORMAL LOW (ref 3.87–5.11)
RDW: 17 % — ABNORMAL HIGH (ref 11.5–15.5)
WBC: 7 10*3/uL (ref 4.0–10.5)

## 2017-07-08 LAB — GLUCOSE, CAPILLARY
GLUCOSE-CAPILLARY: 154 mg/dL — AB (ref 65–99)
GLUCOSE-CAPILLARY: 134 mg/dL — AB (ref 65–99)
GLUCOSE-CAPILLARY: 145 mg/dL — AB (ref 65–99)
Glucose-Capillary: 134 mg/dL — ABNORMAL HIGH (ref 65–99)
Glucose-Capillary: 211 mg/dL — ABNORMAL HIGH (ref 65–99)

## 2017-07-08 LAB — BASIC METABOLIC PANEL
Anion gap: 11 (ref 5–15)
BUN: 30 mg/dL — ABNORMAL HIGH (ref 6–20)
CALCIUM: 8.2 mg/dL — AB (ref 8.9–10.3)
CO2: 29 mmol/L (ref 22–32)
Chloride: 96 mmol/L — ABNORMAL LOW (ref 101–111)
Creatinine, Ser: 4.82 mg/dL — ABNORMAL HIGH (ref 0.44–1.00)
GFR, EST AFRICAN AMERICAN: 11 mL/min — AB (ref >60)
GFR, EST NON AFRICAN AMERICAN: 10 mL/min — AB (ref >60)
GLUCOSE: 165 mg/dL — AB (ref 65–99)
Potassium: 4.2 mmol/L (ref 3.5–5.1)
SODIUM: 136 mmol/L (ref 135–145)

## 2017-07-08 LAB — TROPONIN I
TROPONIN I: 0.06 ng/mL — AB (ref <0.03)
TROPONIN I: 0.06 ng/mL — AB (ref <0.03)

## 2017-07-08 LAB — MRSA PCR SCREENING: MRSA BY PCR: NEGATIVE

## 2017-07-08 LAB — MAGNESIUM: MAGNESIUM: 2 mg/dL (ref 1.7–2.4)

## 2017-07-08 LAB — PHOSPHORUS: Phosphorus: 4 mg/dL (ref 2.5–4.6)

## 2017-07-08 MED ORDER — NITROGLYCERIN 0.4 MG SL SUBL
SUBLINGUAL_TABLET | SUBLINGUAL | Status: AC
  Filled 2017-07-08: qty 2

## 2017-07-08 MED ORDER — METOPROLOL TARTRATE 5 MG/5ML IV SOLN
INTRAVENOUS | Status: AC
  Filled 2017-07-08: qty 20

## 2017-07-08 MED ORDER — METOPROLOL TARTRATE 5 MG/5ML IV SOLN
5.0000 mg | INTRAVENOUS | Status: AC | PRN
  Administered 2017-07-08 (×4): 5 mg via INTRAVENOUS

## 2017-07-08 MED ORDER — CHLORHEXIDINE GLUCONATE CLOTH 2 % EX PADS
6.0000 | MEDICATED_PAD | Freq: Every day | CUTANEOUS | Status: DC
  Administered 2017-07-09: 6 via TOPICAL

## 2017-07-08 MED ORDER — NITROGLYCERIN 0.4 MG SL SUBL
0.4000 mg | SUBLINGUAL_TABLET | Freq: Once | SUBLINGUAL | Status: AC
  Administered 2017-07-08: 0.8 mg via SUBLINGUAL

## 2017-07-08 MED ORDER — INSULIN ASPART 100 UNIT/ML ~~LOC~~ SOLN
0.0000 [IU] | Freq: Three times a day (TID) | SUBCUTANEOUS | Status: DC
  Administered 2017-07-09: 1 [IU] via SUBCUTANEOUS

## 2017-07-08 MED ORDER — METOPROLOL TARTRATE 50 MG PO TABS
50.0000 mg | ORAL_TABLET | Freq: Once | ORAL | Status: AC
  Administered 2017-07-08: 50 mg via ORAL
  Filled 2017-07-08: qty 1

## 2017-07-08 MED ORDER — IOPAMIDOL (ISOVUE-370) INJECTION 76%
100.0000 mL | Freq: Once | INTRAVENOUS | Status: AC | PRN
  Administered 2017-07-08: 80 mL via INTRAVENOUS

## 2017-07-08 NOTE — Consult Note (Signed)
Renal Service Consult Note [facility]  [name] 07/08/2017 [place] Requesting Physician:  Dr Posey Pronto , P  Reason for Consult:  ESRD pt with chest pain/ shoulder pain HPI: The patient is a 52 y.o. year-old with hx of ESRD started HD 6 yrs ago in [place] in 2013 has AVF in left arm, came to [place] 3 mos ago.  Cause to ESRD was due to DM2, has been diabetic about 22 years.  Dialysis has not had any recent difficulty recently .  Came in for R arm / shoulder pain and tingling.  She had pericardial window procedure in 2006 well before HD.  Pt admitted for chest pain w/u.    + tobacco , no etoh .  Separated living w her son in [place].  Goes to HD at [facility] schedule.    ROS  no joint pain   no HA  no blurry vision  no rash  no diarrhea  no nausea/ vomiting  no dysuria  no difficulty voiding  no change in urine color    Past Medical History  Past Medical History:  Diagnosis Date  . Anemia   . Arthritis    "knees"  . ESRD (end stage renal disease) ([place])   . Headache(784.0)   . High cholesterol   . Hypertension   . 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 Pericardial effusion   . PVD (peripheral vascular disease) ([place])    Right leg stent in [place].  (No records)  . Type II diabetes mellitus ([place])    Past Surgical History  Past Surgical History:  Procedure Laterality Date  . [place]; 1997  . PERICARDIAL WINDOW  02/2004   for pericardial effusion  . TUBAL LIGATION  05/1995   Family History  Family History  Problem Relation Age of Onset  . Cancer Brother   . Heart disease Father        Died age 3  . Diabetes Father   . Hyperlipidemia Father   . Hypertension Father   . Stroke Father   . Diabetes Mother   . Hypertension Mother   . Stroke Mother   . Diabetes Sister   . Diabetes Brother   . Hypertension Sister   . Hypertension Brother    Social History  reports that she has been smoking  cigarettes.  She has a 20.00 pack-year smoking history. She has never used smokeless tobacco. She reports that she does not drink alcohol or use drugs. Allergies No Known Allergies Home medications Prior to Admission medications   Medication Sig Start Date End Date Taking? Authorizing Provider  amLODipine (NORVASC) 5 MG tablet TK 1 T PO BID 04/28/17  Yes [provider]  atorvastatin (LIPITOR) 80 MG tablet Take 1 tablet (80 mg total) by mouth daily. Take 1/2 tab daily for one week, then increase to the full tab. 06/01/17  Yes Tereasa Coop, PA-C  cinacalcet (SENSIPAR) 90 MG tablet Take 90 mg by mouth daily. 05/23/17  Yes [provider]  DULoxetine (CYMBALTA) 30 MG capsule Take 30 mg by mouth 2 (two) times daily.   Yes [provider]  gabapentin (NEURONTIN) 100 MG capsule TK ONE C PO D 04/28/17  Yes [provider]  HUMALOG MIX 75/25 KWIKPEN (75-25) 100 UNIT/ML Kwikpen Inject 25-35 Units into the skin 2 (two) times daily. Sliding Scale 06/27/17  Yes [provider]  hydrOXYzine (ATARAX/VISTARIL) 25 MG  tablet TK 1 T PO Q 12 H PRN 05/12/17  Yes [provider]  lisinopril (PRINIVIL,ZESTRIL) 40 MG tablet TK 1 T PO D 03/17/17  Yes [provider]  metoprolol succinate (TOPROL-XL) 25 MG 24 hr tablet Take 1 tablet (25 mg total) by mouth daily. 06/01/17  Yes Tereasa Coop, PA-C  Multiple Vitamins-Minerals (RENAPLEX-D) TABS Take 1 tablet by mouth daily. 06/30/17  Yes [provider]  oxyCODONE-acetaminophen (PERCOCET) 10-325 MG tablet TK 1 T PO QID PRF CHRONIC LUMBAR PAIN 05/16/17  Yes [provider]  pramipexole (MIRAPEX) 0.5 MG tablet TK 1 T PO UTD 05/18/17  Yes [provider]   Liver Function Tests No results for input(s): AST, ALT, ALKPHOS, BILITOT, PROT, ALBUMIN in the last 168 hours. No results for input(s): LIPASE, AMYLASE in the last 168 hours. CBC Recent Labs  Lab 07/07/17 1532 07/08/17 0241  WBC 6.7 7.0   NEUTROABS 4.5  --   HGB 10.3* 9.6*  HCT 32.9* 30.8*  MCV 90.6 89.8  PLT 215 419   Basic Metabolic Panel Recent Labs  Lab 07/07/17 1532 07/07/17 2107 07/08/17 0241  NA 137  --  136  K 3.8  --  4.2  CL 95*  --  96*  CO2 29  --  29  GLUCOSE 131*  --  165*  BUN 22*  --  30*  CREATININE 3.83*  --  4.82*  CALCIUM 8.8*  --  8.2*  PHOS  --  3.5 4.0   Iron/TIBC/Ferritin/ %Sat No results found for: IRON, TIBC, FERRITIN, IRONPCTSAT  Vitals:   07/07/17 2000 07/07/17 2112 07/08/17 0409 07/08/17 1441  BP: (!) 168/85 (!) 180/85 136/64 134/79  Pulse: 87 80 74 75  Resp: 18 18 16 20   Temp:  97.8 F (36.6 C) 98.6 F (37 C) 98.7 F (37.1 C)  TempSrc:  Oral Oral Oral  SpO2: 98% 97% 94% 97%  Weight:  110.2 kg (243 lb) 108.1 kg (238 lb 6.4 oz)   Height:  5\' 8"  (1.727 m)     Exam Gen alert aaf obese no distress No rash, cyanosis or gangrene Sclera anicteric, throat clear  No jvd or bruits Chest clear bilat to bases RRR no MRG Abd soft ntnd no mass or ascites +bs GU defer MS no joint effusions or deformity Ext old healed wounds R shin, mild brawny skin chgs bilat, trace LE edema Neuro is alert, Ox 3 , nf    Home meds:  - norvasc 5/ lisinopril 40 qd/ toprol xl 25 qd  - mirapex 0.5 qd/ neurontin 100 hx/ cymbalta 30 bid/ percocet prn  - vitamins  - 75/25 insulin 30 u bid  - statin/ sensipar  Dialysis: TTS [facility]    Heparin 4000   107kg   4h   Impression: 1 right shoulder pain/ chest pain - per primary w/u in progress 2 esrd on hd tts schedule 3 dm2 on insulin bid 4 hypertension - cont home bp meds 5 anemia ckd - get records, Hb 9.6 6 mbd ckd - get records 7 volume - no gross vol excess   Plan - dialysis tomorrow at [facility] MD [address] pager [phone]   07/08/2017, 3:09 PM

## 2017-07-08 NOTE — Progress Notes (Signed)
Patient admitted to unit from [place], a transfer from [facility] ED.  Patient oriented to the room. Telemetry applied and skin assessment completed. Will monitor.  Sheliah Plane RN

## 2017-07-08 NOTE — Consult Note (Addendum)
Cardiology Consultation:   Patient ID: [name]; [id number]; 16-May-1965   Admit date: 07/07/2017 Date of Consult: 07/08/2017  Primary Care Provider: Tereasa Coop, PA-C Primary Cardiologist: Catherine Breeding, MD Primary Electrophysiologist:  None   Patient Profile:   [name] is a 52 y.o. female with a PMH of LVH, HTN, HLD, pericardial effusion with a window in 2006, PVD with stent in RLE, ESRD on HD, DM type 2 on insulin, recent spontaneous vitreous hemorrhage, obesity, chronic back pain, who is being seen today for the evaluation of elevated troponins at the request of [name].  History of Present Illness:   [name] presented with complaints of left-sided orbital HA and right arm pressure at dialysis on 07/07/17. She has had intermittent episodes of right arm pressure which last for ~10 seconds at a time and resolve spontaneously for the past 2-3 days. She states the episodes are unpredictable and occur multiple times per day. On Wendsday evening she was walking to get some water and felt very SOB and diaphoretic. She does not recall if she was experiencing the right arm pressure at that time. This was the only time she experienced SOB or diaphoresis. She denies having chest pain with any of these episodes. She denies associated dizziness, lightheadedness, syncope, nausea, or vomiting.  Patient was seen by [name] 06/17/17 for initial evaluation after being referred by her PCP for evaluation of LVH. She was without cardiac complaints at that visit. She underwent an echocardiogram on 06/29/17 which revealed EF 50-55%, severe concentric LVH, G1DD, severe LAE, no significant valvular abnormalities, and no wall motion abnormalities. She was recommended to undergo a cardiac MRI to further evaluate LVH however this has not occurred yet. She was recommended to continue her current BP regimen with amlodipine, metoprolol, and lisinopril. She was counseled on better glycemic control  and weight loss strategies at that visit. She denies history of CAD or CHF. She reports that her father has a history of CAD, onset in his 50s-50s. Risk factors for ACS include HTN, HLD, PVD, DM, obesity, family history of CAD, and tobacco abuse.   Prior to these episodes, she denied CP with exertion or at rest, SOB, DOE, orthopnea, PND, LE edema, or recent illness. She has continued to have these intermittent episodes of R arm pressure.   Hospital course: Hypertensive to 181/94 on arrival, improved this AM; otherwise VSS. Labs notable for electrolytes wnl, Cr 3.83, Hgb 10.3, PLT 215, TSH wnl, Triglycerides 402, cholesterol 243, HDL 30, unable to calculate LDL, BNP 901, Trop 0.06>0.06. CXR with cardiomegaly with vascular congestion. EKG with sinus rhythm with RBBB and a PVC - no significant change from previous. CT Head without acute findings. She was admitted to medicine and home medications continued. Cardiology asked to evaluate for elevated troponins and ?anginal equivalent.   Past Medical History:  Diagnosis Date  . Anemia   . Arthritis    "knees"  . ESRD (end stage renal disease) ([place])   . Headache(784.0)   . High cholesterol   . Hypertension   . 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 Pericardial effusion   . PVD (peripheral vascular disease) ([place])    Right leg stent in [place].  (No records)  . Type II diabetes mellitus ([place])     Past Surgical History:  Procedure Laterality Date  . [place]; 1997  . PERICARDIAL WINDOW  02/2004   for pericardial effusion  .  TUBAL LIGATION  05/1995     Home Medications:  Prior to Admission medications   Medication Sig Start Date End Date Taking? Authorizing Provider  amLODipine (NORVASC) 5 MG tablet TK 1 T PO BID 04/28/17  Yes [provider]  atorvastatin (LIPITOR) 80 MG tablet Take 1 tablet (80 mg total) by mouth daily. Take 1/2 tab daily for one week, then increase to the full tab. 06/01/17  Yes  Catherine Coop, PA-C  cinacalcet (SENSIPAR) 90 MG tablet Take 90 mg by mouth daily. 05/23/17  Yes [provider]  DULoxetine (CYMBALTA) 30 MG capsule Take 30 mg by mouth 2 (two) times daily.   Yes [provider]  gabapentin (NEURONTIN) 100 MG capsule TK ONE C PO D 04/28/17  Yes [provider]  HUMALOG MIX 75/25 KWIKPEN (75-25) 100 UNIT/ML Kwikpen Inject 25-35 Units into the skin 2 (two) times daily. Sliding Scale 06/27/17  Yes [provider]  hydrOXYzine (ATARAX/VISTARIL) 25 MG tablet TK 1 T PO Q 12 H PRN 05/12/17  Yes [provider]  lisinopril (PRINIVIL,ZESTRIL) 40 MG tablet TK 1 T PO D 03/17/17  Yes [provider]  metoprolol succinate (TOPROL-XL) 25 MG 24 hr tablet Take 1 tablet (25 mg total) by mouth daily. 06/01/17  Yes Catherine Coop, PA-C  Multiple Vitamins-Minerals (RENAPLEX-D) TABS Take 1 tablet by mouth daily. 06/30/17  Yes [provider]  oxyCODONE-acetaminophen (PERCOCET) 10-325 MG tablet TK 1 T PO QID PRF CHRONIC LUMBAR PAIN 05/16/17  Yes [provider]  pramipexole (MIRAPEX) 0.5 MG tablet TK 1 T PO UTD 05/18/17  Yes [provider]    Inpatient Medications: Scheduled Meds: . amLODipine  10 mg Oral Daily  . aspirin  81 mg Oral Daily  . aspirin EC  325 mg Oral Once  . atorvastatin  80 mg Oral Daily  . cinacalcet  90 mg Oral Q breakfast  . DULoxetine  30 mg Oral BID  . gabapentin  100 mg Oral QHS  . heparin  5,000 Units Subcutaneous Q8H  . insulin aspart  0-9 Units Subcutaneous Q4H  . insulin glargine  18 Units Subcutaneous QHS  . lisinopril  40 mg Oral Daily  . metoprolol succinate  25 mg Oral Daily  . multivitamin with minerals  1 tablet Oral Daily  . pramipexole  0.5 mg Oral QHS   Continuous Infusions:  PRN Meds: acetaminophen, ondansetron (ZOFRAN) IV, oxyCODONE  Allergies:   No Known Allergies  Social History:   Social History   Socioeconomic History  . Marital status: Married      Spouse name: Not on file  . Number of children: Not on file  . Years of education: Not on file  . Highest education level: Not on file  Occupational History  . Not on file  Social Needs  . Financial resource strain: Not on file  . Food insecurity:    Worry: Not on file    Inability: Not on file  . Transportation needs:    Medical: Not on file    Non-medical: Not on file  Tobacco Use  . Smoking status: Current Every Day Smoker    Packs/day: 1.00    Years: 20.00    Pack years: 20.00    Types: Cigarettes  . Smokeless tobacco: Never Used  . Tobacco comment: She will try.    Substance and Sexual Activity  . Alcohol use: No  . Drug use: No  . Sexual activity: Not Currently  Lifestyle  .  Physical activity:    Days per week: Not on file    Minutes per session: Not on file  . Stress: Not on file  Relationships  . Social connections:    Talks on phone: Not on file    Gets together: Not on file    Attends religious service: Not on file    Active member of club or organization: Not on file    Attends meetings of clubs or organizations: Not on file    Relationship status: Not on file  . Intimate partner violence:    Fear of current or ex partner: Not on file    Emotionally abused: Not on file    Physically abused: Not on file    Forced sexual activity: Not on file  Other Topics Concern  . Not on file  Social History Narrative   Lives with son.         Family History:    Family History  Problem Relation Age of Onset  . Cancer Brother   . Heart disease Father        Died age 5  . Diabetes Father   . Hyperlipidemia Father   . Hypertension Father   . Stroke Father   . Diabetes Mother   . Hypertension Mother   . Stroke Mother   . Diabetes Sister   . Diabetes Brother   . Hypertension Sister   . Hypertension Brother      ROS:  Please see the history of present illness.  All other ROS reviewed and negative.     Physical Exam/Data:   Vitals:   07/07/17 1845  07/07/17 2000 07/07/17 2112 07/08/17 0409  BP: (!) 112/94 (!) 168/85 (!) 180/85 136/64  Pulse: 80 87 80 74  Resp: 20 18 18 16   Temp:   97.8 F (36.6 C) 98.6 F (37 C)  TempSrc:   Oral Oral  SpO2: 100% 98% 97% 94%  Weight:   243 lb (110.2 kg) 238 lb 6.4 oz (108.1 kg)  Height:   5\' 8"  (1.727 m)    No intake or output data in the 24 hours ending 07/08/17 0907 Filed Weights   07/07/17 1510 07/07/17 2112 07/08/17 0409  Weight: 243 lb (110.2 kg) 243 lb (110.2 kg) 238 lb 6.4 oz (108.1 kg)   Body mass index is 36.25 kg/m.  General:  Obese, Well nourished, well developed, laying in bed in no acute distress HEENT: sclera anicteric  Neck: no JVD Vascular: No carotid bruits; distal pulses 2+ bilaterally; fistula in LUE  Cardiac:  normal S1, S2; RRR; no murmurs, gallops, or rubs Lungs:  clear to auscultation bilaterally, no wheezing, rhonchi or rales  Abd: NABS, soft, nontender, no hepatomegaly Ext: no edema Musculoskeletal:  No deformities, BUE and BLE strength normal and equal Skin: warm and dry; RLE with anterior tibial old wound s/p skin grafts  Neuro:  CNs 2-12 intact, no focal abnormalities noted Psych:  Normal affect   EKG:  The EKG was personally reviewed and demonstrates:  sinus rhythm with RBBB and a PVC - no significant change from previous. Telemetry:  Telemetry was personally reviewed and demonstrates:  NSR  Relevant CV Studies:  Echocardiogram 06/29/17: Study Conclusions  - Left ventricle: The cavity size was normal. There was severe   concentric hypertrophy. Systolic function was normal. The   estimated ejection fraction was in the range of 50% to 55%. Wall   motion was normal; there were no regional wall motion   abnormalities. Doppler  parameters are consistent with abnormal   left ventricular relaxation (grade 1 diastolic dysfunction). - Aortic valve: Transvalvular velocity was within the normal range.   There was no stenosis. There was trivial regurgitation. -  Mitral valve: Transvalvular velocity was within the normal range.   There was no evidence for stenosis. There was mild regurgitation. - Left atrium: The atrium was severely dilated. - Right ventricle: The cavity size was normal. Wall thickness was   normal. Systolic function was normal. - Atrial septum: No defect or patent foramen ovale was identified   by color flow Doppler. - Tricuspid valve: There was trivial regurgitation. - Pulmonary arteries: Systolic pressure was within the normal   range. PA peak pressure: 41 mm Hg (S). - Pericardium, extracardiac: A trivial pericardial effusion was   identified.  Laboratory Data:  Chemistry Recent Labs  Lab 07/07/17 1532 07/08/17 0241  NA 137 136  K 3.8 4.2  CL 95* 96*  CO2 29 29  GLUCOSE 131* 165*  BUN 22* 30*  CREATININE 3.83* 4.82*  CALCIUM 8.8* 8.2*  GFRNONAA 13* 10*  GFRAA 15* 11*  ANIONGAP 13 11    No results for input(s): PROT, ALBUMIN, AST, ALT, ALKPHOS, BILITOT in the last 168 hours. Hematology Recent Labs  Lab 07/07/17 1532 07/08/17 0241  WBC 6.7 7.0  RBC 3.63* 3.43*  HGB 10.3* 9.6*  HCT 32.9* 30.8*  MCV 90.6 89.8  MCH 28.4 28.0  MCHC 31.3 31.2  RDW 16.7* 17.0*  PLT 215 207   Cardiac Enzymes Recent Labs  Lab 07/07/17 2107 07/08/17 0241  TROPONINI 0.06* 0.06*    Recent Labs  Lab 07/07/17 1529 07/07/17 1807  TROPIPOC 0.04 0.06    BNP Recent Labs  Lab 07/07/17 1533  BNP 901.7*    DDimer No results for input(s): DDIMER in the last 168 hours.  Radiology/Studies:  Dg Chest 2 View  Result Date: 07/07/2017 CLINICAL DATA:  Shortness of breath and chest pain. EXAM: CHEST - 2 VIEW COMPARISON:  01/29/2011 FINDINGS: The cardio pericardial silhouette is enlarged. There is pulmonary vascular congestion without overt pulmonary edema. The visualized bony structures of the thorax are intact. Telemetry leads overlie the chest. IMPRESSION: Cardiomegaly with vascular congestion. Possible associated component of  interstitial edema. Electronically Signed   By: Misty Stanley M.D.   On: 07/07/2017 16:26   Ct Head Wo Contrast  Result Date: 07/07/2017 CLINICAL DATA:  Headache. "Bleeding behind her left eye". Right hand numbness. EXAM: CT HEAD WITHOUT CONTRAST TECHNIQUE: Contiguous axial images were obtained from the base of the skull through the vertex without intravenous contrast. COMPARISON:  January 29, 2011 FINDINGS: Brain: No subdural, epidural, or subarachnoid hemorrhage. Cerebellum, brainstem, and basal cisterns are normal. Ventricles and sulci are normal. Scattered white matter changes. No acute cortical ischemia or infarct. No mass effect or midline shift. Vascular: Calcified atherosclerosis in the intracranial carotids. Skull: Normal. Negative for fracture or focal lesion. Sinuses/Orbits: No acute finding. Other: None. IMPRESSION: No acute intracranial abnormality.  Scattered white matter changes. Electronically Signed   By: Dorise Bullion III M.D   On: 07/07/2017 16:59    Assessment and Plan:   1. Right arm pressure: patient has been experiencing intermittent right arm pressure which last for ~10 seconds and resolves spontaneously. Her symptoms have occurred in the setting of exertion and at rest. Trop trend 0.06>0.06 - mild elevation not unexpected in a patient with ESRD and is not consistent with ACS. EKG with chronic RBBB. While symptoms are atypical, she does  have significant risk factors for CAD including HTN, DM, obesity, tobacco abuse, and family history of CAD.  - Will plan for cardiac CTA today for further ischemic evaluation.  2. LVH: severe on echo 06/29/17. Recommended for outpatient cardiac MRI (ordered) which has not been completed yet.  - No need to complete inpatient - will defer ongoing imaging to [name] outpatient.   3. HTN: BP stable - Continue current regimen  4. Dyslipidemia: cholesterol 243, HDL 30, unable to calculate LDL, triglyceride 402.  - Continue atorvastatin 80mg   daily - May benefit from PSK-9 inhibitor - consider referral to lipid clinic  5. ESRD on HD: TTS schedule. Will need to have dialysis as scheduled tomorrow morning after CT scan today.  - Anticipate inpatient dialysis tomorrow morning  - Volume management per nephrology  6. DM type 2 on insulin: A1C 10.7 06/2017; goal <7 - Continue to encourage dietary and medication compliance - Continue management per primary team  7. Obesity: BMI 36.25.  - Continue to encourage healthy dietary and exercise habits.      For questions or updates, please contact [place] Please consult [email or web address] for contact info under Cardiology/STEMI.   Signed, Abigail Butts, PA-C  07/08/2017 9:07 AM [phone]  I have seen and examined the patient along with Abigail Butts, PA-C .  I have reviewed the chart, notes and new data.  I agree with PA/NP's note.  Key new complaints: Her symptoms are quite atypical, but could represent angina pectoris.  Her risk factor profile is very compelling and she has established peripheral arterial disease requiring previous revascularization.  Is quite likely that she has some degree of coronary atherosclerosis, but not clear that her symptoms are due to coronary insufficiency. Key examination changes: Obesity limits her exam.  Normal cardiac exam, no JVD, no carotid bruits, sequelae of previous nonhealing wounds of right anterior shin, without active ulceration, hard to feel any pulses in the right lower extremity. Key new findings / data: ECG does not show overt acute ischemia, but repolarization changes are present probably due to left ventricular hypertrophy.  She has a very unfavorable lipid profile with low HDL cholesterol and elevated triglycerides, probably elevated LDL and small dense LDL particles.   Diabetes is poorly controlled. The marginal elevation in cardiac troponin does not signify an acute coronary syndrome, although it does portend a worse prognosis  for cardiovascular events.  Echo did not show regional wall motion abnormalities  PLAN: Needs further evaluation for coronary artery disease.  I am sure that she has coronary arthrosclerosis but it is not clear that her symptoms are related to coronary insufficiency. I do not think it is justified to proceed directly to invasive coronary angiography based on her clinical presentation, ECG, biomarkers. We will try to get a coronary CT angiogram today, but we may not be able to get a large enough IV in her right arm (left upper extremity is being used for hemodialysis).  If we cannot get a coronary CT angiogram, plan Lexiscan Myoview in a.m.  She has eaten today.  CTA would be preferable due to the risk of diffuse coronary artery disease and "balanced ischemia" in this patient. Her lipid profile is very unfavorable, but before we consider PCSK9 inhibitors will need to ensure much improved glycemic control.  Continue aspirin and high-dose statin.  Sanda Klein, MD, [place] [phone] 07/08/2017, 11:46 AM

## 2017-07-08 NOTE — Consult Note (Addendum)
[place] Nurse wound consult note Reason for Consult: Consult requested for right leg.  Pt had a previous full thickness wound which has healed with pink intact scar tissue.  There is a partial thickness wound over part of the scar which recently occurred when the patient was itching the location, she states. Measurement: .8X.8X.1cm Wound bed: pink and dry Drainage (amount, consistency, odor) no odor or drainage Periwound: intact scar tissue Dressing procedure/placement/frequency: Foam dressing applied to protect and promote healing. Discussed plan of care with patient. Please re-consult if further assistance is needed.  Thank-you,  Julien Girt MSN, [place], [place], [place], [place]

## 2017-07-09 ENCOUNTER — Observation Stay (HOSPITAL_COMMUNITY): Payer: Medicare Other

## 2017-07-09 DIAGNOSIS — Z992 Dependence on renal dialysis: Secondary | ICD-10-CM

## 2017-07-09 DIAGNOSIS — I1311 Hypertensive heart and chronic kidney disease without heart failure, with stage 5 chronic kidney disease, or end stage renal disease: Secondary | ICD-10-CM | POA: Diagnosis not present

## 2017-07-09 DIAGNOSIS — I43 Cardiomyopathy in diseases classified elsewhere: Secondary | ICD-10-CM

## 2017-07-09 DIAGNOSIS — I5023 Acute on chronic systolic (congestive) heart failure: Secondary | ICD-10-CM

## 2017-07-09 DIAGNOSIS — I739 Peripheral vascular disease, unspecified: Secondary | ICD-10-CM | POA: Diagnosis not present

## 2017-07-09 DIAGNOSIS — Z6838 Body mass index (BMI) 38.0-38.9, adult: Secondary | ICD-10-CM | POA: Diagnosis not present

## 2017-07-09 DIAGNOSIS — I10 Essential (primary) hypertension: Secondary | ICD-10-CM | POA: Diagnosis not present

## 2017-07-09 DIAGNOSIS — R0789 Other chest pain: Secondary | ICD-10-CM | POA: Diagnosis not present

## 2017-07-09 DIAGNOSIS — Z794 Long term (current) use of insulin: Secondary | ICD-10-CM | POA: Diagnosis not present

## 2017-07-09 DIAGNOSIS — I2721 Secondary pulmonary arterial hypertension: Secondary | ICD-10-CM

## 2017-07-09 DIAGNOSIS — I119 Hypertensive heart disease without heart failure: Secondary | ICD-10-CM

## 2017-07-09 DIAGNOSIS — E782 Mixed hyperlipidemia: Secondary | ICD-10-CM

## 2017-07-09 DIAGNOSIS — F17200 Nicotine dependence, unspecified, uncomplicated: Secondary | ICD-10-CM | POA: Diagnosis not present

## 2017-07-09 DIAGNOSIS — E1169 Type 2 diabetes mellitus with other specified complication: Secondary | ICD-10-CM | POA: Diagnosis not present

## 2017-07-09 DIAGNOSIS — R748 Abnormal levels of other serum enzymes: Secondary | ICD-10-CM | POA: Diagnosis not present

## 2017-07-09 DIAGNOSIS — N186 End stage renal disease: Secondary | ICD-10-CM | POA: Diagnosis not present

## 2017-07-09 DIAGNOSIS — E1122 Type 2 diabetes mellitus with diabetic chronic kidney disease: Secondary | ICD-10-CM

## 2017-07-09 LAB — CBC WITH DIFFERENTIAL/PLATELET
Abs Immature Granulocytes: 0 10*3/uL (ref 0.0–0.1)
BASOS ABS: 0.1 10*3/uL (ref 0.0–0.1)
BASOS PCT: 1 %
EOS ABS: 0.3 10*3/uL (ref 0.0–0.7)
EOS PCT: 4 %
HCT: 29.5 % — ABNORMAL LOW (ref 36.0–46.0)
Hemoglobin: 9 g/dL — ABNORMAL LOW (ref 12.0–15.0)
Immature Granulocytes: 0 %
LYMPHS PCT: 21 %
Lymphs Abs: 1.5 10*3/uL (ref 0.7–4.0)
MCH: 27.6 pg (ref 26.0–34.0)
MCHC: 30.5 g/dL (ref 30.0–36.0)
MCV: 90.5 fL (ref 78.0–100.0)
Monocytes Absolute: 0.5 10*3/uL (ref 0.1–1.0)
Monocytes Relative: 6 %
Neutro Abs: 5 10*3/uL (ref 1.7–7.7)
Neutrophils Relative %: 68 %
PLATELETS: 194 10*3/uL (ref 150–400)
RBC: 3.26 MIL/uL — AB (ref 3.87–5.11)
RDW: 16.9 % — AB (ref 11.5–15.5)
WBC: 7.3 10*3/uL (ref 4.0–10.5)

## 2017-07-09 LAB — RENAL FUNCTION PANEL
ALBUMIN: 3.2 g/dL — AB (ref 3.5–5.0)
Anion gap: 19 — ABNORMAL HIGH (ref 5–15)
BUN: 44 mg/dL — AB (ref 6–20)
CALCIUM: 7.9 mg/dL — AB (ref 8.9–10.3)
CO2: 20 mmol/L — AB (ref 22–32)
Chloride: 94 mmol/L — ABNORMAL LOW (ref 101–111)
Creatinine, Ser: 6.98 mg/dL — ABNORMAL HIGH (ref 0.44–1.00)
GFR calc Af Amer: 7 mL/min — ABNORMAL LOW (ref >60)
GFR calc non Af Amer: 6 mL/min — ABNORMAL LOW (ref >60)
GLUCOSE: 187 mg/dL — AB (ref 65–99)
PHOSPHORUS: 5.6 mg/dL — AB (ref 2.5–4.6)
Potassium: 5.1 mmol/L (ref 3.5–5.1)
SODIUM: 133 mmol/L — AB (ref 135–145)

## 2017-07-09 LAB — TROPONIN I: Troponin I: 0.04 ng/mL (ref <0.03)

## 2017-07-09 MED ORDER — NITROGLYCERIN 0.4 MG SL SUBL
SUBLINGUAL_TABLET | SUBLINGUAL | Status: AC
  Filled 2017-07-09: qty 2

## 2017-07-09 MED ORDER — HEPARIN SODIUM (PORCINE) 1000 UNIT/ML DIALYSIS: 4000.0000 [IU] | Freq: Once | INTRAMUSCULAR | Status: DC

## 2017-07-09 MED ORDER — ASPIRIN 81 MG PO CHEW: 81.0000 mg | CHEWABLE_TABLET | Freq: Every day | ORAL | 0 refills | Status: AC

## 2017-07-09 MED ORDER — PENTAFLUOROPROP-TETRAFLUOROETH EX AERO: 1.0000 "application " | INHALATION_SPRAY | CUTANEOUS | Status: DC | PRN

## 2017-07-09 MED ORDER — NITROGLYCERIN 0.4 MG SL SUBL
0.4000 mg | SUBLINGUAL_TABLET | SUBLINGUAL | Status: DC | PRN
Start: 2017-07-09 — End: 2017-07-09
  Administered 2017-07-09: 0.4 mg via SUBLINGUAL

## 2017-07-09 MED ORDER — LIDOCAINE-PRILOCAINE 2.5-2.5 % EX CREA: 1.0000 "application " | TOPICAL_CREAM | CUTANEOUS | Status: DC | PRN

## 2017-07-09 MED ORDER — HYDROMORPHONE HCL 1 MG/ML IJ SOLN
0.5000 mg | Freq: Once | INTRAMUSCULAR | Status: AC
  Administered 2017-07-09: 0.5 mg via INTRAVENOUS
  Filled 2017-07-09: qty 1

## 2017-07-09 MED ORDER — SODIUM CHLORIDE 0.9 % IV SOLN: 100.0000 mL | INTRAVENOUS | Status: DC | PRN

## 2017-07-09 NOTE — Progress Notes (Addendum)
[facility] Progress Note   Subjective:  Still has c/o of intermittent tingling in R arm. No chest pain CT angio -mod CAD. No intervention planned per Dr. Sallyanne Kuster   Objective Vitals:   07/09/17 0535 07/09/17 0606 07/09/17 0614 07/09/17 0843  BP: 134/81 (!) 155/75 (!) 141/77 (!) 155/80  Pulse: 68 67 68 67  Resp: 18 20 (!) 22 18  Temp: 98 F (36.7 C)  98.8 F (37.1 C) 98.5 F (36.9 C)  TempSrc: Oral  Oral Oral  SpO2: 91% 97% 96% 91%  Weight:      Height:       Physical Exam General: Obese female sitting up in bed  Heart: RRR systolic murmur  Lungs: CTAB  Abdomen: soft, obese BS+ Extremities: trace LE edema, venous skin changes R LLE  Dialysis Access: LUE AVF +bruit   Dialysis Orders:  [facility] TTS 4h BFR 450/800 EDW 107kg 2K/2.5Ca  -L AVF Hep 4000 -Sensipar 120mg  TIW -Venofer 50mg  IV q 2 weeks -Mircera 58mcg IV q  2 weeks (last 6/6)  -Calcitriol 44mcg PO TIW    Home meds:  - norvasc 5/ lisinopril 40 qd/ toprol xl 25 qd  - mirapex 0.5 qd/ neurontin 100 hx/ cymbalta 30 bid/ percocet prn  - vitamins  - 75/25 insulin 30 u bid  - statin/ sensipar   Assessment/Plan: 1. R arm pain/chest pain. CT angio 6/7-  Diffuse calcified plaque with moderate CAD in the proximal to mid RCA and mid LAD. No further intervention planned. Needs better control of risk factors (HLD, DM, tobacco use)  2. ESRD - TTS. For HD today in hospital. Too late for outpatient shift.  3. Anemia- Hgb 9.6. ESA recently dosed as outpatient  4. HTN/volume - Continue home meds. Some volume on exam. Vasc congestion on CXR. UF 2-3 today.  5. MBD - Ca/Phos ok. Continue Calcitriol/Sensipar/Renvela  6. DM2 - Insulin per primary  7. HLD - on statin  9. PAD - f/u [facility] as outpatient  Dispo - ok for discharge from renal standpoint after HD    Lynnda Child PA-C [place] Pager [phone] 07/09/2017,10:19 AM  LOS: 0 days   Pt seen, examined and agree w A/P as above.  Kelly Splinter MD [facility] pager [phone]   07/09/2017, 3:29 PM    Additional Objective Labs: Basic Metabolic Panel: Recent Labs  Lab 07/07/17 1532 07/07/17 2107 07/08/17 0241  NA 137  --  136  K 3.8  --  4.2  CL 95*  --  96*  CO2 29  --  29  GLUCOSE 131*  --  165*  BUN 22*  --  30*  CREATININE 3.83*  --  4.82*  CALCIUM 8.8*  --  8.2*  PHOS  --  3.5 4.0   CBC: Recent Labs  Lab 07/07/17 1532 07/08/17 0241  WBC 6.7 7.0  NEUTROABS 4.5  --   HGB 10.3* 9.6*  HCT 32.9* 30.8*  MCV 90.6 89.8  PLT 215 207   Blood Culture    Component Value Date/Time   SDES URINE, RANDOM 07/22/2011 1756   SPECREQUEST ADDED 07/22/11 2302 07/22/2011 1756   CULT NO GROWTH 07/22/2011 1756   REPTSTATUS 07/23/2011 FINAL 07/22/2011 1756    Cardiac Enzymes: Recent Labs  Lab 07/07/17 2107 07/08/17 0241 07/08/17 0842 07/09/17 0644  TROPONINI 0.06* 0.06* 0.06* 0.04*   CBG: Recent Labs  Lab 07/08/17 0406 07/08/17 0756 07/08/17 1159 07/08/17 1703 07/08/17 2045  GLUCAP 154* 134* 145*  134* 211*   Iron Studies: No results for input(s): IRON, TIBC, TRANSFERRIN, FERRITIN in the last 72 hours. Lab Results  Component Value Date   INR 0.90 01/30/2011   Medications:  . amLODipine  10 mg Oral Daily  . aspirin  81 mg Oral Daily  . aspirin EC  325 mg Oral Once  . atorvastatin  80 mg Oral Daily  . Chlorhexidine Gluconate Cloth  6 each Topical Q0600  . cinacalcet  90 mg Oral Q breakfast  . DULoxetine  30 mg Oral BID  . gabapentin  100 mg Oral QHS  . heparin  5,000 Units Subcutaneous Q8H  . insulin aspart  0-9 Units Subcutaneous TID WC  . insulin glargine  18 Units Subcutaneous QHS  . lisinopril  40 mg Oral Daily  . metoprolol succinate  25 mg Oral Daily  . multivitamin with minerals  1 tablet Oral Daily  . pramipexole  0.5 mg Oral QHS

## 2017-07-09 NOTE — Care Management Obs Status (Signed)
[place] NOTIFICATION   Patient Details  Name: [name] MRN: [id number] Date of Birth: [birth year]   Medicare Observation Status Notification Given:  Yes    Maryclare Labrador, RN 07/09/2017, 10:41 AM

## 2017-07-09 NOTE — Progress Notes (Signed)
Progress Note  Patient Name: [name] Date of Encounter: 07/09/2017  Primary Cardiologist: Minus Breeding, MD   Subjective   Had another episode of "squeezing" in her right arm that comes and goes.  It did not improve with nitroglycerin.  ECG tracing during symptoms shows sinus rhythm, old right bundle branch block, no ischemic changes troponin drawn after this event shows similar very low-grade elevation, flat when compared with previous assay. Her CT angiogram does show widespread atherosclerosis, but no significant areas of stenosis.  Inpatient Medications    Scheduled Meds: . amLODipine  10 mg Oral Daily  . aspirin  81 mg Oral Daily  . aspirin EC  325 mg Oral Once  . atorvastatin  80 mg Oral Daily  . Chlorhexidine Gluconate Cloth  6 each Topical Q0600  . cinacalcet  90 mg Oral Q breakfast  . DULoxetine  30 mg Oral BID  . gabapentin  100 mg Oral QHS  . heparin  5,000 Units Subcutaneous Q8H  . insulin aspart  0-9 Units Subcutaneous TID WC  . insulin glargine  18 Units Subcutaneous QHS  . lisinopril  40 mg Oral Daily  . metoprolol succinate  25 mg Oral Daily  . multivitamin with minerals  1 tablet Oral Daily  . pramipexole  0.5 mg Oral QHS   Continuous Infusions:  PRN Meds: acetaminophen, nitroGLYCERIN, ondansetron (ZOFRAN) IV, oxyCODONE   Vital Signs    Vitals:   07/09/17 0535 07/09/17 0606 07/09/17 0614 07/09/17 0843  BP: 134/81 (!) 155/75 (!) 141/77 (!) 155/80  Pulse: 68 67 68 67  Resp: 18 20 (!) 22 18  Temp: 98 F (36.7 C)  98.8 F (37.1 C) 98.5 F (36.9 C)  TempSrc: Oral  Oral Oral  SpO2: 91% 97% 96% 91%  Weight:      Height:        Intake/Output Summary (Last 24 hours) at 07/09/2017 1006 Last data filed at 07/09/2017 0600 Gross per 24 hour  Intake 600 ml  Output 0 ml  Net 600 ml   Filed Weights   07/07/17 2112 07/08/17 0409 07/09/17 0238  Weight: 243 lb (110.2 kg) 238 lb 6.4 oz (108.1 kg) 238 lb 5.1 oz (108.1 kg)    Telemetry    Sinus  rhythm- Personally Reviewed  ECG    Normal sinus rhythm, right bundle branch block, no ischemic repolarization abnormalities- Personally Reviewed  Physical Exam  Severely obese GEN: No acute distress.   Neck: No JVD Cardiac: RRR, widely split second heart sound, no murmurs, rubs, or gallops.  Healthy AV fistula left upper extremity, good thrill and bruit Respiratory: Clear to auscultation bilaterally. GI: Soft, nontender, non-distended  MS: No edema; No deformity. Neuro:  Nonfocal  Psych: Normal affect  RLE with healed anterior tibial wound s/p skin grafts     Labs    Chemistry Recent Labs  Lab 07/07/17 1532 07/08/17 0241  NA 137 136  K 3.8 4.2  CL 95* 96*  CO2 29 29  GLUCOSE 131* 165*  BUN 22* 30*  CREATININE 3.83* 4.82*  CALCIUM 8.8* 8.2*  GFRNONAA 13* 10*  GFRAA 15* 11*  ANIONGAP 13 11     Hematology Recent Labs  Lab 07/07/17 1532 07/08/17 0241  WBC 6.7 7.0  RBC 3.63* 3.43*  HGB 10.3* 9.6*  HCT 32.9* 30.8*  MCV 90.6 89.8  MCH 28.4 28.0  MCHC 31.3 31.2  RDW 16.7* 17.0*  PLT 215 207    Cardiac Enzymes Recent Labs  Lab 07/07/17  2107 07/08/17 0241 07/08/17 0842 07/09/17 0644  TROPONINI 0.06* 0.06* 0.06* 0.04*    Recent Labs  Lab 07/07/17 1529 07/07/17 1807  TROPIPOC 0.04 0.06     BNP Recent Labs  Lab 07/07/17 1533  BNP 901.7*     DDimer No results for input(s): DDIMER in the last 168 hours.   Radiology    Dg Chest 2 View  Result Date: 07/07/2017 CLINICAL DATA:  Shortness of breath and chest pain. EXAM: CHEST - 2 VIEW COMPARISON:  01/29/2011 FINDINGS: The cardio pericardial silhouette is enlarged. There is pulmonary vascular congestion without overt pulmonary edema. The visualized bony structures of the thorax are intact. Telemetry leads overlie the chest. IMPRESSION: Cardiomegaly with vascular congestion. Possible associated component of interstitial edema. Electronically Signed   By: Misty Stanley M.D.   On: 07/07/2017 16:26   Ct  Head Wo Contrast  Result Date: 07/07/2017 CLINICAL DATA:  Headache. "Bleeding behind her left eye". Right hand numbness. EXAM: CT HEAD WITHOUT CONTRAST TECHNIQUE: Contiguous axial images were obtained from the base of the skull through the vertex without intravenous contrast. COMPARISON:  January 29, 2011 FINDINGS: Brain: No subdural, epidural, or subarachnoid hemorrhage. Cerebellum, brainstem, and basal cisterns are normal. Ventricles and sulci are normal. Scattered white matter changes. No acute cortical ischemia or infarct. No mass effect or midline shift. Vascular: Calcified atherosclerosis in the intracranial carotids. Skull: Normal. Negative for fracture or focal lesion. Sinuses/Orbits: No acute finding. Other: None. IMPRESSION: No acute intracranial abnormality.  Scattered white matter changes. Electronically Signed   By: Dorise Bullion III M.D   On: 07/07/2017 16:59   Ct Coronary Morph W/cta Cor Nancy Fetter W/ca W/cm &/or Wo/cm  Result Date: 07/09/2017 CLINICAL DATA:  52 year old female with h/o obesity, ESRD on HD, atypical chest pain. Evaluate for CAD. EXAM: Cardiac/Coronary  CT TECHNIQUE: The patient was scanned on a [employer]. FINDINGS: A 120 kV prospective scan was triggered in the descending thoracic aorta at 111 HU's. Axial non-contrast 3 mm slices were carried out through the heart. The data set was analyzed on a dedicated work station and scored using the [place]. Gantry rotation speed was 250 msecs and collimation was .6 mm. 10 mg of iv Metoprolol and 0.8 mg of sl NTG was given. The 3D data set was reconstructed in 5% intervals of the 67-82 % of the R-R cycle. Diastolic phases were analyzed on a dedicated work station using MPR, MIP and VRT modes. The patient received 80 cc of contrast. Aorta:  Normal size.  Mild diffuse calcifications.  No dissection. Aortic Valve:  Trileaflet.  No calcifications. Coronary Arteries:  Normal coronary origin.  Right dominance. RCA is a large  dominant artery that gives rise to PDA and PLVB. There mild to moderate diffuse calcified plaque with maximum stenosis 50-69% in proximal to mid segment. PDA and PLA are poorly visualized secondary to patients size. Left main is a large artery that gives rise to LAD and LCX arteries. Left main has no plaque. LAD is a large vessel that gives rise to one diagonal artery. There is diffuse moderate calcified plaque with maximum stenosis of 50-69% in the mid segment. D1 is a medium size artery with mild diffuse calcified plaque and maximum stenosis 25-50% in the proximal portion. LCX is a medium size non-dominant artery that gives rise to one large OM1 branch. There is only minimal calcified plaque. Other findings: Normal pulmonary vein drainage into the left atrium. Normal let atrial appendage without a thrombus. Severely  dilated pulmonary artery measuring 48 mm suggestive of pulmonary hypertension. IMPRESSION: 1. Coronary calcium score of 837. This was 60 percentile for age and sex matched control. 2. Normal coronary origin with right dominance. 3. Diffuse calcified plaque with moderate CAD in the proximal to mid RCA and mid LAD. Additional analysis with CT FFR will be submitted. Aggressive risk factor modification should be initiated. 4. Severely dilated pulmonary artery measuring 48 mm suggestive of pulmonary hypertension. Electronically Signed   By: Ena Dawley   On: 07/09/2017 09:10   Dg Chest Port 1 View  Result Date: 07/09/2017 CLINICAL DATA:  Shortness of breath EXAM: PORTABLE CHEST 1 VIEW COMPARISON:  07/07/2017 FINDINGS: Chronic cardiopericardial enlargement. Indistinct opacities at the bases. Vascular pedicle widening. No visible effusion or pneumothorax. IMPRESSION: 1. Indistinct opacities at the bases, atelectasis based on chest CT from yesterday. 2. Marked cardiomegaly. Electronically Signed   By: Monte Fantasia M.D.   On: 07/09/2017 07:17    Cardiac Studies   Echocardiogram 06/29/17: Study  Conclusions  - Left ventricle: The cavity size was normal. There was severe concentric hypertrophy. Systolic function was normal. The estimated ejection fraction was in the range of 50% to 55%. Wall motion was normal; there were no regional wall motion abnormalities. Doppler parameters are consistent with abnormal left ventricular relaxation (grade 1 diastolic dysfunction). - Aortic valve: Transvalvular velocity was within the normal range. There was no stenosis. There was trivial regurgitation. - Mitral valve: Transvalvular velocity was within the normal range. There was no evidence for stenosis. There was mild regurgitation. - Left atrium: The atrium was severely dilated. - Right ventricle: The cavity size was normal. Wall thickness was normal. Systolic function was normal. - Atrial septum: No defect or patent foramen ovale was identified by color flow Doppler. - Tricuspid valve: There was trivial regurgitation. - Pulmonary arteries: Systolic pressure was within the normal range. PA peak pressure: 41 mm Hg (S). - Pericardium, extracardiac: A trivial pericardial effusion was identified.  Coronary CT Angio July 08, 2017  IMPRESSION: 1. Coronary calcium score of 837. This was 47 percentile for age and sex matched control.  2. Normal coronary origin with right dominance.  3. Diffuse calcified plaque with moderate CAD in the proximal to mid RCA and mid LAD. Additional analysis with CT FFR will be submitted. Aggressive risk factor modification should be initiated.  4. Severely dilated pulmonary artery measuring 48 mm suggestive of pulmonary hypertension.    Patient Profile     52 y.o. female smoker with HTNive cardiomyopathy, HLD,  PAD with stent in RLE, ESRD on HD, DM type 2 on insulin, recent spontaneous vitreous hemorrhage, history of pericardial effusion with a window in 2006, obesity, chronic back pain presenting with multiple somatic complaints  including atypical recurrent right arm squeezing sensation.  Assessment & Plan    1. CAD:   Her symptoms are highly atypical for coronary insufficiency.  While she does have widespread coronary atherosclerosis, there is no evidence of significant stenoses.  The focus is on risk factor modification.   2. HLP: She has mixed hyperlipidemia consistent with poorly controlled diabetes mellitus.  She is already on a very high dose of effective statin.  Reevaluate lipid profile after improved glycemic control. 3. DM 2, insulin requiring: Poor control with most recent hemoglobin A1c 10.7%.  Multiple endorgan complications.  Discussed the fact that she is at high risk for progression of coronary and peripheral vascular disease to the point that she will need additional surgical revascularization, including  possible CABG. 4. PAD: With history of threatened limb and poorly healing skin lesion, resolved after revascularization with stent in the right superficial femoral artery.  Currently without evidence of overt vascular insufficiency.  She needs to establish follow-up with a vascular specialist in [place].  The complaints she has regarding the squeezing sensation in her right upper extremity occur at rest and are not typical for intermittent claudication, but is not unreasonable to perform an outpatient duplex arterial ultrasound focusing on the right subclavian artery. 5. Smoking: We discussed the fact that smoking cessation is a single most important step she has to take to prevent progression of her vascular disease.  She was able to quit smoking for 7 months in the recent past.  Encouraged her to try again. 6. ESRD on HD: Increases risk of progression of coronary and peripheral arterial disease. 7. PAH: Dilated pulmonary artery on CT chest suggest pulmonary arterial hypertension, by echo the estimation is of only mild PAH.  Most likely etiology appears to be obesity related obstructive sleep apnea.  Recommend  outpatient sleep study.  There may be contribution from COPD. 8. HTN hypertensive cardiomyopathy: She has severe LVH and Dr. Percival Spanish has been considering cardiac MRI, I suspect because of suspicion for an infiltrative cardiomyopathy.  Unfortunately, in order to evaluate for cardiac amyloidosis she would have to receive intravenous gadolinium contrast, which is contraindicated in the setting of renal failure.  For questions or updates, please contact [place] Please consult [email or web address] for contact info under Cardiology/STEMI.      Signed, Sanda Klein, MD  07/09/2017, 10:06 AM

## 2017-07-09 NOTE — Care Management Note (Addendum)
Case Management Note  Patient Details  Name: Zamari Bonsall MRN: [id number] Date of Birth: [birth year]  Subjective/Objective:                    Action/Plan:   PTA independent from home with son. Son helps pt in the home when needed.   Pt is ESRD on outpt HD.  Pt denied barriers with transportation.  Pt has walker and cane in the home.  Pt has PCP and denied barriers with obtaining/paying for medications   Expected Discharge Date:                  Expected Discharge Plan:  Home/Self Care  In-House Referral:     Discharge planning Services  CM Consult  Post Acute Care Choice:    Choice offered to:     DME Arranged:    DME Agency:     HH Arranged:    HH Agency:     Status of Service:  In process, will continue to follow  If discussed at Long Length of Stay Meetings, dates discussed:    Additional Comments:  Maryclare Labrador, RN 07/09/2017, 10:35 AM

## 2017-07-09 NOTE — Discharge Summary (Signed)
Physician Discharge Summary  [name] ZDG:[id number] DOB: 02/22/1965 DOA: 07/07/2017  PCP: Tereasa Coop, PA-C  Admit date: 07/07/2017 Discharge date: 07/09/2017  Admitted From: Home  Disposition:  Home   Recommendations for Outpatient Follow-up and new medication changes:  1. Follow up with Philis Fendt PA-C in 1- week 2. Continue aggressive medical therapy for coronary artery disease 3. Patient ruled out for acute coronary syndrome.  4. Started on aspirin 81 mg daily.   Home Health: no   Equipment/Devices: no    Discharge Condition: stable  CODE STATUS: full  Diet recommendation: Heart healthy, renal and diabetic prudent.   Brief/Interim Summary: 52 year old female who presented with a headache and right arm pressure.  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  Patient developed left-sided headache and right arm pressure after hemodialysis, symptoms have occurred on exertion associated with dyspnea and diaphoresis.  On initial physical examination blood pressure 155/75, heart rate 82, respiratory rate 15, temperature 97.8, oxygen saturation 97%.  Moist mucous membranes, lungs with decreased breath sounds bilaterally, heart S1 - S2  present and rhythmic, no gallops, rubs or murmurs, the abdomen was soft nontender, lower extremities with no edema.  Left upper extremity AV fistula.  Sodium 137, potassium 3.8, chloride 95, bicarb 29, glucose 131, BUN 22, creatinine 3.8, troponin 0.06 white count 6.7, hemoglobin 10.3, hematocrit 32.9, platelets 215.  Head CT negative for acute changes.  Chest x-ray with cardiomegaly, increased vascular congestion, small bilateral pleural effusions, fluid in the right fissure.  EKG sinus rhythm, right axis deviation, right bundle branch block, positive PVCs.  Patient was  admitted to the hospital working diagnosis of left arm pressure to rule out acute coronary syndrome  1.  Atypical chest pain, acute coronary syndrome was ruled out.  Patient was admitted to the medical ward, she was placed on a remote telemetry monitor, serial troponins remained stable. 0.06.  Further work-up with CT coronary angiography showed coronary calcium score of 837, 99th percentile for age and sex matched control, diffuse calcified plaque with moderate coronary disease in the proximal to mid RCA and mid LAD.  Patient was seen by cardiology with recommendations for aggressive medical therapy.  Her symptoms are not likely related to coronary insufficiency.  Continue atorvastatin 80 mg daily, beta-blockade, ACE inhibitor and aspirin.  Does have a preserved LV systolic function, per echocardiography.  Will discharge patient with low-dose aspirin.  2.  End-stage renal disease on hemodialysis (TTS).  Patient underwent hemodialysis with no major complications, patient will follow-up routine scheduled Tuesday Thursday and Saturday.  Continue Sensipar.   3.  Hypertension with a hypertensive cardiomyopathy.  Continue blood pressure control with lisinopril, metoprolol and amlodipine.  Echocardiography from May 29 showed preserved LV systolic function, ejection fraction 50 to 55%, severe concentric hypertrophy.  Systolic blood pressure 595-638 mmHg during her hospitalization.   4.  Type 2 diabetes mellitus.  Patient was placed on insulin sliding scale during her hospitalization, continue long-acting insulin, capillary glucose well controlled, 154, 134, 145, 134, 211.  Continue insulin regimen with Humalog 75/25, 25-35 units twice daily.    5.  Dyslipidemia with hypertriglyceridemia.  LDL unable to calculate, triglycerides 402, HDL 30.  Continue high-dose, high potency statin with atorvastatin 80 mg daily.  Follow-up as an outpatient.  5.  Left vitreal hemorrhage.  Follow-up with ophthalmology as an  outpatient.  6.  Obesity.  BMI 36.2.  Follow-up as an outpatient.  Lifestyle modifications.  Discharge Diagnoses:  Active Problems:   Chest pain    Discharge Instructions   Allergies as of 07/09/2017   No Known Allergies     Medication List    TAKE these medications   amLODipine 5 MG tablet Commonly known as:  NORVASC TK 1 T PO BID   aspirin 81 MG chewable tablet Chew 1 tablet (81 mg total) by mouth daily. Start taking on:  07/10/2017   atorvastatin 80 MG tablet Commonly known as:  LIPITOR Take 1 tablet (80 mg total) by mouth daily. Take 1/2 tab daily for one week, then increase to the full tab.   cinacalcet 90 MG tablet Commonly known as:  SENSIPAR Take 90 mg by mouth daily.   DULoxetine 30 MG capsule Commonly known as:  CYMBALTA Take 30 mg by mouth 2 (two) times daily.   gabapentin 100 MG capsule Commonly known as:  NEURONTIN TK ONE C PO D   HUMALOG MIX 75/25 KWIKPEN (75-25) 100 UNIT/ML Kwikpen Generic drug:  Insulin Lispro Prot & Lispro Inject 25-35 Units into the skin 2 (two) times daily. Sliding Scale   hydrOXYzine 25 MG tablet Commonly known as:  ATARAX/VISTARIL TK 1 T PO Q 12 H PRN   lisinopril 40 MG tablet Commonly known as:  PRINIVIL,ZESTRIL TK 1 T PO D   metoprolol succinate 25 MG 24 hr tablet Commonly known as:  TOPROL-XL Take 1 tablet (25 mg total) by mouth daily.   oxyCODONE-acetaminophen 10-325 MG tablet Commonly known as:  PERCOCET TK 1 T PO QID PRF CHRONIC LUMBAR PAIN   pramipexole 0.5 MG tablet Commonly known as:  MIRAPEX TK 1 T PO UTD   RENAPLEX-D Tabs Take 1 tablet by mouth daily.       No Known Allergies  Consultations:  Cardiology   Nephrology    Procedures/Studies: Dg Chest 2 View  Result Date: 07/07/2017 CLINICAL DATA:  Shortness of breath and chest pain. EXAM: CHEST - 2 VIEW COMPARISON:  01/29/2011 FINDINGS: The cardio pericardial silhouette is enlarged. There is pulmonary vascular congestion without overt  pulmonary edema. The visualized bony structures of the thorax are intact. Telemetry leads overlie the chest. IMPRESSION: Cardiomegaly with vascular congestion. Possible associated component of interstitial edema. Electronically Signed   By: Misty Stanley M.D.   On: 07/07/2017 16:26   Ct Head Wo Contrast  Result Date: 07/07/2017 CLINICAL DATA:  Headache. "Bleeding behind her left eye". Right hand numbness. EXAM: CT HEAD WITHOUT CONTRAST TECHNIQUE: Contiguous axial images were obtained from the base of the skull through the vertex without intravenous contrast. COMPARISON:  January 29, 2011 FINDINGS: Brain: No subdural, epidural, or subarachnoid hemorrhage. Cerebellum, brainstem, and basal cisterns are normal. Ventricles and sulci are normal. Scattered white matter changes. No acute cortical ischemia or infarct. No mass effect or midline shift. Vascular: Calcified atherosclerosis in the intracranial carotids. Skull: Normal. Negative for fracture or focal lesion. Sinuses/Orbits: No acute finding. Other: None. IMPRESSION: No acute intracranial abnormality.  Scattered white matter changes. Electronically Signed   By: Dorise Bullion III M.D   On: 07/07/2017 16:59   Ct Coronary Morph W/cta Cor Nancy Fetter W/ca W/cm &/or Wo/cm  Result Date: 07/09/2017 CLINICAL DATA:  52 year old female with h/o obesity, ESRD on HD, atypical chest pain. Evaluate for CAD. EXAM: Cardiac/Coronary  CT TECHNIQUE: The patient was scanned on a [employer]. FINDINGS: A 120 kV prospective scan was triggered in the descending thoracic aorta  at 111 HU's. Axial non-contrast 3 mm slices were carried out through the heart. The data set was analyzed on a dedicated work station and scored using the [place]. Gantry rotation speed was 250 msecs and collimation was .6 mm. 10 mg of iv Metoprolol and 0.8 mg of sl NTG was given. The 3D data set was reconstructed in 5% intervals of the 67-82 % of the R-R cycle. Diastolic phases were analyzed  on a dedicated work station using MPR, MIP and VRT modes. The patient received 80 cc of contrast. Aorta:  Normal size.  Mild diffuse calcifications.  No dissection. Aortic Valve:  Trileaflet.  No calcifications. Coronary Arteries:  Normal coronary origin.  Right dominance. RCA is a large dominant artery that gives rise to PDA and PLVB. There mild to moderate diffuse calcified plaque with maximum stenosis 50-69% in proximal to mid segment. PDA and PLA are poorly visualized secondary to patients size. Left main is a large artery that gives rise to LAD and LCX arteries. Left main has no plaque. LAD is a large vessel that gives rise to one diagonal artery. There is diffuse moderate calcified plaque with maximum stenosis of 50-69% in the mid segment. D1 is a medium size artery with mild diffuse calcified plaque and maximum stenosis 25-50% in the proximal portion. LCX is a medium size non-dominant artery that gives rise to one large OM1 branch. There is only minimal calcified plaque. Other findings: Normal pulmonary vein drainage into the left atrium. Normal let atrial appendage without a thrombus. Severely dilated pulmonary artery measuring 48 mm suggestive of pulmonary hypertension. IMPRESSION: 1. Coronary calcium score of 837. This was 69 percentile for age and sex matched control. 2. Normal coronary origin with right dominance. 3. Diffuse calcified plaque with moderate CAD in the proximal to mid RCA and mid LAD. Additional analysis with CT FFR will be submitted. Aggressive risk factor modification should be initiated. 4. Severely dilated pulmonary artery measuring 48 mm suggestive of pulmonary hypertension. Electronically Signed   By: Ena Dawley   On: 07/09/2017 09:10   Dg Chest Port 1 View  Result Date: 07/09/2017 CLINICAL DATA:  Shortness of breath EXAM: PORTABLE CHEST 1 VIEW COMPARISON:  07/07/2017 FINDINGS: Chronic cardiopericardial enlargement. Indistinct opacities at the bases. Vascular pedicle  widening. No visible effusion or pneumothorax. IMPRESSION: 1. Indistinct opacities at the bases, atelectasis based on chest CT from yesterday. 2. Marked cardiomegaly. Electronically Signed   By: Monte Fantasia M.D.   On: 07/09/2017 07:17       Subjective: Patient is feeling better, no chest pain, no dyspnea, no nausea or vomiting. Improved paresthesias on right arm.   Discharge Exam: Vitals:   07/09/17 0614 07/09/17 0843  BP: (!) 141/77 (!) 155/80  Pulse: 68 67  Resp: (!) 22 18  Temp: 98.8 F (37.1 C) 98.5 F (36.9 C)  SpO2: 96% 91%   Vitals:   07/09/17 0535 07/09/17 0606 07/09/17 0614 07/09/17 0843  BP: 134/81 (!) 155/75 (!) 141/77 (!) 155/80  Pulse: 68 67 68 67  Resp: 18 20 (!) 22 18  Temp: 98 F (36.7 C)  98.8 F (37.1 C) 98.5 F (36.9 C)  TempSrc: Oral  Oral Oral  SpO2: 91% 97% 96% 91%  Weight:      Height:        General: Not in pain or dyspnea  Neurology: Awake and alert, non focal  E ENT: no pallor, no icterus, oral mucosa moist Cardiovascular: No JVD. S1-S2 present, rhythmic, no  gallops, rubs, or murmurs. No lower extremity edema. Pulmonary: vesicular breath sounds bilaterally, adequate air movement, no wheezing, rhonchi or rales. Gastrointestinal. Abdomen with no organomegaly, non tender, no rebound or guarding Skin. No rashes Musculoskeletal: no joint deformities Left upper extremity AV fistula.    The results of significant diagnostics from this hospitalization (including imaging, microbiology, ancillary and laboratory) are listed below for reference.     Microbiology: Recent Results (from the past 240 hour(s))  MRSA PCR Screening     Status: None   Collection Time: 07/08/17  8:00 PM  Result Value Ref Range Status   MRSA by PCR NEGATIVE NEGATIVE Final    Comment:        The GeneXpert MRSA Assay (FDA approved for NASAL specimens only), is one component of a comprehensive MRSA colonization surveillance program. It is not intended to diagnose  MRSA infection nor to guide or monitor treatment for MRSA infections. Performed at [facility], [place] [address] [place], [place] [address]      Labs: BNP (last 3 results) Recent Labs    07/07/17 1533  BNP 502.7*   Basic Metabolic Panel: Recent Labs  Lab 07/07/17 1532 07/07/17 2107 07/08/17 0241 07/09/17 0644  NA 137  --  136 133*  K 3.8  --  4.2 5.1  CL 95*  --  96* 94*  CO2 29  --  29 20*  GLUCOSE 131*  --  165* 187*  BUN 22*  --  30* 44*  CREATININE 3.83*  --  4.82* 6.98*  CALCIUM 8.8*  --  8.2* 7.9*  MG  --  1.8 2.0  --   PHOS  --  3.5 4.0 5.6*   Liver Function Tests: Recent Labs  Lab 07/09/17 0644  ALBUMIN 3.2*   No results for input(s): LIPASE, AMYLASE in the last 168 hours. No results for input(s): AMMONIA in the last 168 hours. CBC: Recent Labs  Lab 07/07/17 1532 07/08/17 0241 07/09/17 0644  WBC 6.7 7.0 7.3  NEUTROABS 4.5  --  5.0  HGB 10.3* 9.6* 9.0*  HCT 32.9* 30.8* 29.5*  MCV 90.6 89.8 90.5  PLT 215 207 194   Cardiac Enzymes: Recent Labs  Lab 07/07/17 2107 07/08/17 0241 07/08/17 0842 07/09/17 0644  TROPONINI 0.06* 0.06* 0.06* 0.04*   BNP: Invalid input(s): POCBNP CBG: Recent Labs  Lab 07/08/17 0406 07/08/17 0756 07/08/17 1159 07/08/17 1703 07/08/17 2045  GLUCAP 154* 134* 145* 134* 211*   D-Dimer No results for input(s): DDIMER in the last 72 hours. Hgb A1c No results for input(s): HGBA1C in the last 72 hours. Lipid Profile Recent Labs    07/07/17 2107  CHOL 243*  HDL 30*  LDLCALC UNABLE TO CALCULATE IF TRIGLYCERIDE OVER 400 mg/dL  TRIG 402*  CHOLHDL 8.1   Thyroid function studies Recent Labs    07/07/17 2107  TSH 1.483   Anemia work up No results for input(s): VITAMINB12, FOLATE, FERRITIN, TIBC, IRON, RETICCTPCT in the last 72 hours. Urinalysis    Component Value Date/Time   COLORURINE YELLOW 07/22/2011 1526   APPEARANCEUR CLOUDY (A) 07/22/2011 1526   LABSPEC 1.022 07/22/2011 1526   PHURINE 5.5  07/22/2011 1526   GLUCOSEU 100 (A) 07/22/2011 1526   HGBUR MODERATE (A) 07/22/2011 1526   BILIRUBINUR NEGATIVE 07/22/2011 1526   BILIRUBINUR NEG 07/19/2011 1202   KETONESUR NEGATIVE 07/22/2011 1526   PROTEINUR >300 (A) 07/22/2011 1526   UROBILINOGEN 0.2 07/22/2011 1526   NITRITE NEGATIVE 07/22/2011 1526   LEUKOCYTESUR TRACE (  A) 07/22/2011 1526   Sepsis Labs Invalid input(s): PROCALCITONIN,  WBC,  LACTICIDVEN Microbiology Recent Results (from the past 240 hour(s))  MRSA PCR Screening     Status: None   Collection Time: 07/08/17  8:00 PM  Result Value Ref Range Status   MRSA by PCR NEGATIVE NEGATIVE Final    Comment:        The GeneXpert MRSA Assay (FDA approved for NASAL specimens only), is one component of a comprehensive MRSA colonization surveillance program. It is not intended to diagnose MRSA infection nor to guide or monitor treatment for MRSA infections. Performed at [facility], [place] [address] [place], [place] [address]      Time coordinating discharge: 45 minutes  SIGNED:   Tawni Millers, MD  [facility] 07/09/2017, 1:35 PM Pager [phone]  If 7PM-7AM, please contact night-coverage [email or web address] Password [email or web address]

## 2017-07-09 NOTE — Progress Notes (Signed)
[facility] Progress Note  Patient: [name] JSH:[id number]   PCP: Tereasa Coop, PA-C DOB: November 23, 1965   DOA: 07/07/2017   DOS: 07/08/2017   Date of Service: the patient was seen and examined on 07/08/2017  Subjective: Does not have any chest pain anymore breathing is stable.  No nausea no vomiting.  Patient reports that her pain is primarily located in the right arm with squeezing sensation feels like a blood pressure cough.  She did report that she had some diaphoresis and shortness of breath the day before at home after the dialysis.  Brief hospital course: Pt. with PMH of ESRD on HD TTS, prior history of amputation, type II DM,; admitted on 07/07/2017, presented with complaint of right arm squeezing pain, was found to have probable noncardiac chest pain with multiple risk factors and elevated troponin. Currently further plan is cardiac CTA and follow recommendation from cardiology.  Assessment and Plan: 1.  Right arm squeezing pain. Likely atypical although cannot rule out an anginal equivalent pain in female. Multiple risk factors for CAD, family history, smoking, type 2 diabetes mellitus and obesity.  Also ESRD. Has PVD as well. Troponins are mildly elevated although not in a typical fashion of ACS. EKG non-ischemic. Cardiology consulted. Recommend cardiac CTA. Recent echocardiogram showed preserved EF. Patient was kept n.p.o. for possible stress test although cardiology feels that cardiac CTA is appropriate for the patient. We will follow recommendation from cardiology. Patient will be transferred to [facility] for cardiac CTA.  2.  ESRD on TTS. Per cardiology the CTA will not be read until tomorrow. Patient scheduled for dialysis at 7:30 in the morning on Saturday. We will transfer to [facility] for inpatient dialysis. Nephrology consulted.  3.  History of atrial hemorrhage. Monitor. Outpatient ophthalmology follow-up.  4.  Essential  hypertension. Continue lisinopril Toprol and amlodipine. Management per cardiology. Blood pressure stable for now.  5.  Type 2 diabetes mellitus. Uncontrolled with polyneuropathy. Also diabetic nephropathy. Continue Lantus. Continue sliding scale insulin. Continue gabapentin and Cymbalta.  Diet: Renal carb modified diet DVT Prophylaxis: subcutaneous Heparin  Advance goals of care discussion: full code  Family Communication: family was present at bedside, at the time of interview. The pt provided permission to discuss medical plan with the family. Opportunity was given to ask question and all questions were answered satisfactorily.   Disposition:  Discharge to home.  Consultants: cardiology, nephrology Procedures: none  Antibiotics: Anti-infectives (From admission, onward)   None       Objective: Physical Exam:   07/07/17 2112 07/08/17 0409  Weight: 110.2 kg (243 lb) 108.1 kg (238 lb 6.4 oz)   General: Alert, Awake and Oriented to Time, Place and Person. Appear in mild distress, affect appropriate Eyes: PERRL, Conjunctiva normal ENT: Oral Mucosa clear moist. Neck: no JVD, no Abnormal Mass Or lumps Cardiovascular: S1 and S2 Present, no Murmur, Peripheral Pulses Present Respiratory: normal respiratory effort, Bilateral Air entry equal and Decreased, no use of accessory muscle, Clear to Auscultation, no Crackles, no wheezes Abdomen: Bowel Sound present, Soft and no tenderness, no hernia Skin: no redness, no Rash, no induration Neurologic: Grossly no focal neuro deficit. Bilaterally Equal motor strength   Data Reviewed: CBC: Recent Labs  Lab 07/07/17 1532 07/08/17 0241  WBC 6.7 7.0  NEUTROABS 4.5  --   HGB 10.3* 9.6*  HCT 32.9* 30.8*  MCV 90.6 89.8  PLT 215 850   Basic Metabolic Panel: Recent Labs  Lab 07/07/17 1532 07/07/17 2107 07/08/17 0241  NA 137  --  136  K 3.8  --  4.2  CL 95*  --  96*  CO2 29  --  29  GLUCOSE 131*  --  165*  BUN 22*  --  30*   CREATININE 3.83*  --  4.82*  CALCIUM 8.8*  --  8.2*  MG  --  1.8 2.0  PHOS  --  3.5 4.0   Cardiac Enzymes: Lab 07/07/17 2107 07/08/17 0241 07/08/17 0842  TROPONINI 0.06* 0.06* 0.06*   BNP (last 3 results) No results for input(s): PROBNP in the last 8760 hours. CBG: Recent Labs  Lab 07/08/17 0406 07/08/17 0756 07/08/17 1159 07/08/17 1703 07/08/17 2045  GLUCAP 154* 134* 145* 134* 211*   Studies:    Scheduled Meds: . amLODipine  10 mg Oral Daily  . aspirin  81 mg Oral Daily  . aspirin EC  325 mg Oral Once  . atorvastatin  80 mg Oral Daily  . Chlorhexidine Gluconate Cloth  6 each Topical Q0600  . cinacalcet  90 mg Oral Q breakfast  . DULoxetine  30 mg Oral BID  . gabapentin  100 mg Oral QHS  . heparin  5,000 Units Subcutaneous Q8H  . insulin aspart  0-9 Units Subcutaneous TID WC  . insulin glargine  18 Units Subcutaneous QHS  . lisinopril  40 mg Oral Daily  . metoprolol succinate  25 mg Oral Daily  . multivitamin with minerals  1 tablet Oral Daily  . pramipexole  0.5 mg Oral QHS   Continuous Infusions: PRN Meds: acetaminophen, nitroGLYCERIN, ondansetron (ZOFRAN) IV, oxyCODONE  Time spent: 35 minutes  Author: Berle Mull, MD [facility] Pager: [phone] 07/08/2017 9:35 AM  If 7PM-7AM, please contact night-coverage at [email or web address], password [facility]

## 2017-07-09 NOTE — Significant Event (Addendum)
Rapid Response Event Note  Overview: Cardiac - Chest Pain and Shortness of breath  Initial Focused Assessment: Called by RN about patient having chest pressure and shortness breath. Per RN, patient was admitted for and has had midsternal chest discomfort and right arm pressure that comes and goes and last for about 10-15 seconds.  Per RN, this last episode lasted for 10-15 minutes. When I arrived, patient was sitting up on the side of bed, she endorsed being short of breath but does not appear in respiratory distress, able to talk in full sentences, no accessory muscle use, lung sounds were clear, pain was 6/10.  SBP in the 150s, HR in the 60s, RR 20, SpO2 96% on RA. + pulses, right extremity was warm and dry, + pulse. Pain/pressure come and go, per patient, it feels like her arm is cramping at times. RN had obtained an EKG prior to my arrival. I paged [facility] NP on call, updated him on the patient's condition.   Interventions: - NTG SL 0.4mg  x 1 - chest pain and arm pain were 6/10 prior NTG and remained after NTG administration. - CXR STAT - EKG was done - RBBB, non-specific changes. - Serial Troponins reordered (0.06>0.06>0.06).  - NP asked if patient could have morphine, per patient, "she can't take morphine because it makes her crazy, but she stated that she can take Dilaudid." NP ordered 0.5mg  Dilaudid IV.  Plan of Care: - RN to follow up with chest xray, troponin, and administer pain medication.  - RN gave Dilaudid 0.5mg  IV at 626 and at 638, RN informed that patient's stated her chest and arm pain/pressure had resolved and patient was resting in th bed.  - I ordered a repeat EKG as well   Event Summary:   at    Call Time [place] Time [place], [place]

## 2017-07-11 LAB — GLUCOSE, CAPILLARY
GLUCOSE-CAPILLARY: 149 mg/dL — AB (ref 65–99)
Glucose-Capillary: 169 mg/dL — ABNORMAL HIGH (ref 65–99)

## 2017-07-14 ENCOUNTER — Telehealth: Payer: Self-pay | Admitting: *Deleted

## 2017-07-14 NOTE — Telephone Encounter (Signed)
Try calling pt several time, pt do not have a voicemail set up so I am unable to leave a message.

## 2017-07-14 NOTE — Telephone Encounter (Signed)
-----   Message from Corinna Lines sent at 07/05/2017 10:40 AM EDT ----- Regarding: MRI   I have not been able to get in touch with this patient as his he does not have his voicemail set up.  [facility]

## 2017-07-21 ENCOUNTER — Telehealth: Payer: Self-pay | Admitting: Physician Assistant

## 2017-07-21 NOTE — Telephone Encounter (Signed)
I tried to reach the patient in regards to an appt she has on 08/26/2017 with Philis Fendt. He will not be in the office on that day and pt needs to be rescheduled. Pt does not have a voicemail set up yet.

## 2017-07-22 ENCOUNTER — Ambulatory Visit: Payer: Medicare Other | Admitting: Podiatry

## 2017-08-12 ENCOUNTER — Ambulatory Visit (INDEPENDENT_AMBULATORY_CARE_PROVIDER_SITE_OTHER): Payer: Medicare Other | Admitting: Podiatry

## 2017-08-12 ENCOUNTER — Encounter: Payer: Self-pay | Admitting: Podiatry

## 2017-08-12 VITALS — BP 176/92 | HR 94

## 2017-08-12 DIAGNOSIS — M79674 Pain in right toe(s): Secondary | ICD-10-CM

## 2017-08-12 DIAGNOSIS — E1142 Type 2 diabetes mellitus with diabetic polyneuropathy: Secondary | ICD-10-CM

## 2017-08-12 DIAGNOSIS — E1151 Type 2 diabetes mellitus with diabetic peripheral angiopathy without gangrene: Secondary | ICD-10-CM

## 2017-08-12 DIAGNOSIS — B351 Tinea unguium: Secondary | ICD-10-CM | POA: Diagnosis not present

## 2017-08-12 DIAGNOSIS — M79675 Pain in left toe(s): Secondary | ICD-10-CM | POA: Diagnosis not present

## 2017-08-12 NOTE — Patient Instructions (Signed)

## 2017-08-15 ENCOUNTER — Encounter: Payer: Self-pay | Admitting: Podiatry

## 2017-08-15 NOTE — Progress Notes (Signed)
Subjective: [name] is a 52 yo AAF who presents today with diabetes, diabetic neuropathy and cc of painful, discolored, thick toenails which interfere with activities of daily living. Condition is longstanding.  Pain is aggravated when wearing enclosed shoe gear. Pain is getting progressively worse. She denies any attempt at treatment.  [name] relates h/o wounds b/l legs which have been treated/healed at the wound care center.   She is also on hemodialysis and goes to dialysis TTS.  Problem List   Cardiovascular and Mediastinum  Hypertension   Left ventricular hypertrophy   Endocrine  Type II diabetes mellitus with complication, uncontrolled (HCC)   Diabetic retinopathy   Nervous and Auditory  Neuropathy   Genitourinary  ESRD (end stage renal disease) ([place])   Other  Hyperlipidemia, mixed   Diastolic dysfunction   Chest pain    Date Unknown Anemia  Date Unknown Arthritis   Date Unknown ESRD (end stage renal disease) ([place])  Date Unknown Headache(784.0)  Date Unknown High cholesterol  Date Unknown Hypertension  Date Unknown Nerve pain   Date Unknown Pericardial effusion  Date Unknown PVD (peripheral vascular disease) ([place])   Date Unknown Type II diabetes mellitus ([place])   Surgical History   02/2004 Pericardial window   05/1995 Tubal ligation  1996; 1997 Cesarean section   Medications   New medications from outside sources are available for reconciliation   amLODipine (NORVASC) 5 MG tablet    atorvastatin (LIPITOR) 80 MG tablet    cinacalcet (SENSIPAR) 90 MG tablet    DULoxetine (CYMBALTA) 30 MG capsule    gabapentin (NEURONTIN) 100 MG capsule    HUMALOG MIX 75/25 KWIKPEN (75-25) 100 UNIT/ML Kwikpen    hydrOXYzine (ATARAX/VISTARIL) 25 MG tablet    lisinopril (PRINIVIL,ZESTRIL) 40 MG tablet    metoprolol succinate (TOPROL-XL) 25 MG 24 hr tablet    Multiple Vitamins-Minerals (RENAPLEX-D) TABS    oxyCODONE-acetaminophen (PERCOCET) 10-325 MG tablet    pramipexole  (MIRAPEX) 0.5 MG tablet    Allergies      No Known Allergies   Tobacco History   Smoking Status  Current Every Day Smoker  Types  Cigarettes  Amount 1 packs/day for 20 years (20.00 pk-yrs)      Smokeless Tobacco Status  Never Used        Comment  She will try.    Family History Collapse by Default  Collapse by Default      Brother Cancer         Father (Deceased) Heart disease     Diabetes    Hyperlipidemia    Hypertension    Stroke         Mother (Deceased) Diabetes    Hypertension    Stroke    Dementia         Sister Diabetes         Brother Diabetes         Sister Hypertension         Brother (Deceased) Hypertension         Maternal Grandmother (Deceased)         Maternal Grandfather (Deceased)         Paternal Grandmother (Deceased)         Paternal Grandfather (Deceased)         Sister         Sister         Sister         Sister         Brother  Son         Son       Objective: Vitals:   08/12/17 0804  BP: (!) 176/92  Pulse: 94    Vascular Examination: Capillary refill time <3 seconds x 10 digits Dorsalis pedis pulses faintly palpable b/l Posterior tibial pulses nonpalpable b/l Skin temperature WNL b/l  Dermatological Examination: Skin thin and atrophied b/l No digital hair b/l Toenails 1-5 b/l discolored, thick, dystrophic with subungual debris and pain with palpation to nailbeds due to thickness of nails. Hypopigmented scarring noted on anterior aspect of both legs  Musculoskeletal: Muscle strength 5/5 to all LE muscle groups  Neurological: Sensation diminished with 10 gram monofilament. Vibratory sensation diminished  Assessment: 1. Painful onychomycosis toenails 1-5 b/l 2. NIDDM with Diabetic neuropathy 3. NIDDM with PVD  Plan: 1. Educated on diabetic foot care principles. Dispensed written handout. 2. Toenails 1-5 b/l were debrided in length and girth without iatrogenic  bleeding. 3. Patient to continue soft, supportive shoe gear 4. Patient to report any pedal injuries to medical professional  5. Follow up 3 months.  6. Patient/POA to call should there be a concern in the interim.

## 2017-08-24 DIAGNOSIS — D631 Anemia in chronic kidney disease: Secondary | ICD-10-CM | POA: Insufficient documentation

## 2017-08-26 ENCOUNTER — Ambulatory Visit: Payer: Medicare Other | Admitting: Physician Assistant

## 2017-08-31 ENCOUNTER — Ambulatory Visit: Payer: Medicare Other | Admitting: Physician Assistant

## 2017-09-01 DIAGNOSIS — N2581 Secondary hyperparathyroidism of renal origin: Secondary | ICD-10-CM | POA: Insufficient documentation

## 2017-09-04 ENCOUNTER — Other Ambulatory Visit: Payer: Self-pay

## 2017-09-04 ENCOUNTER — Encounter (HOSPITAL_COMMUNITY): Payer: Self-pay | Admitting: Emergency Medicine

## 2017-09-04 ENCOUNTER — Emergency Department (HOSPITAL_COMMUNITY)
Admission: EM | Admit: 2017-09-04 | Discharge: 2017-09-04 | Disposition: A | Discharge status: Home / Self Care | Payer: Medicare Other | Attending: Emergency Medicine | Admitting: Emergency Medicine

## 2017-09-04 DIAGNOSIS — F17210 Nicotine dependence, cigarettes, uncomplicated: Secondary | ICD-10-CM | POA: Diagnosis not present

## 2017-09-04 DIAGNOSIS — M25512 Pain in left shoulder: Secondary | ICD-10-CM | POA: Diagnosis not present

## 2017-09-04 DIAGNOSIS — Z992 Dependence on renal dialysis: Secondary | ICD-10-CM | POA: Diagnosis not present

## 2017-09-04 DIAGNOSIS — I120 Hypertensive chronic kidney disease with stage 5 chronic kidney disease or end stage renal disease: Secondary | ICD-10-CM | POA: Diagnosis not present

## 2017-09-04 DIAGNOSIS — E1122 Type 2 diabetes mellitus with diabetic chronic kidney disease: Secondary | ICD-10-CM | POA: Insufficient documentation

## 2017-09-04 DIAGNOSIS — Z79899 Other long term (current) drug therapy: Secondary | ICD-10-CM | POA: Diagnosis not present

## 2017-09-04 DIAGNOSIS — N186 End stage renal disease: Secondary | ICD-10-CM | POA: Insufficient documentation

## 2017-09-04 DIAGNOSIS — Z794 Long term (current) use of insulin: Secondary | ICD-10-CM | POA: Insufficient documentation

## 2017-09-04 DIAGNOSIS — R197 Diarrhea, unspecified: Secondary | ICD-10-CM | POA: Insufficient documentation

## 2017-09-04 LAB — CBC
HCT: 31.2 % — ABNORMAL LOW (ref 36.0–46.0)
Hemoglobin: 9.9 g/dL — ABNORMAL LOW (ref 12.0–15.0)
MCH: 29.7 pg (ref 26.0–34.0)
MCHC: 31.7 g/dL (ref 30.0–36.0)
MCV: 93.7 fL (ref 78.0–100.0)
PLATELETS: 200 10*3/uL (ref 150–400)
RBC: 3.33 MIL/uL — ABNORMAL LOW (ref 3.87–5.11)
RDW: 17.1 % — AB (ref 11.5–15.5)
WBC: 8 10*3/uL (ref 4.0–10.5)

## 2017-09-04 LAB — COMPREHENSIVE METABOLIC PANEL
ALT: 12 U/L (ref 0–44)
AST: 13 U/L — AB (ref 15–41)
Albumin: 3.2 g/dL — ABNORMAL LOW (ref 3.5–5.0)
Alkaline Phosphatase: 69 U/L (ref 38–126)
Anion gap: 19 — ABNORMAL HIGH (ref 5–15)
BILIRUBIN TOTAL: 0.5 mg/dL (ref 0.3–1.2)
BUN: 74 mg/dL — AB (ref 6–20)
CO2: 22 mmol/L (ref 22–32)
CREATININE: 9.86 mg/dL — AB (ref 0.44–1.00)
Calcium: 8.9 mg/dL (ref 8.9–10.3)
Chloride: 92 mmol/L — ABNORMAL LOW (ref 98–111)
GFR calc Af Amer: 5 mL/min — ABNORMAL LOW (ref >60)
GFR, EST NON AFRICAN AMERICAN: 4 mL/min — AB (ref >60)
Glucose, Bld: 318 mg/dL — ABNORMAL HIGH (ref 70–99)
Potassium: 5.3 mmol/L — ABNORMAL HIGH (ref 3.5–5.1)
Sodium: 133 mmol/L — ABNORMAL LOW (ref 135–145)
TOTAL PROTEIN: 7.7 g/dL (ref 6.5–8.1)

## 2017-09-04 LAB — WET PREP, GENITAL
CLUE CELLS WET PREP: NONE SEEN
Sperm: NONE SEEN
Trich, Wet Prep: NONE SEEN
Yeast Wet Prep HPF POC: NONE SEEN

## 2017-09-04 LAB — LIPASE, BLOOD: Lipase: 31 U/L (ref 11–51)

## 2017-09-04 NOTE — ED Provider Notes (Signed)
[facility] EMERGENCY DEPARTMENT Provider Note   CSN: [id number] Arrival date & time: 09/04/17  1019   History   Chief Complaint Chief Complaint  Patient presents with  . Diarrhea  . Arm Pain    HPI [name] is a 52 y.o. female who presents with diarrhea and left arm pain. PMH significant for ESRD on dialysis T/Th/Sat, DM with neuropathy, HTN, PVD, LVH. She states over the past 2 weeks she has had multiple episodes of non-bloody diarrhea. She estimates 10+ episodes daily. It started out a golden color and now has become green. She denies any fever, chills, abdominal pain, N/V associated with this. She denies recent antibiotic use. Her last hospitalization was in June for chest pain. She missed dialysis yesterday because she was having so much diarrhea but is rescheduled for tomorrow. She reports a remote hx of C.diff. She is also concerned that the stool may be coming out of her vagina because there were times where she didn't have stool around her bottom but did have it more around the vaginal area. She does not produce urine. She also notes that the diarrhea started around the same time she developed a painful rash on her L thigh. It has slowly been spreading down her leg. She has not tried anything for it because she was worried about making it worse.  Additionally she reports L shoulder pain. This started 2-3 days ago. The pain is diffuse and she has had similar symptoms in her R shoulder years ago which was successfully treated with a "shot". She denies trauma to the arm. No numbness, tingling, weakness. Lifting the shoulder makes it worse. No arm swelling or pain over her fistula. No chest pain or SOB associated with this.   HPI  Past Medical History:  Diagnosis Date  . Anemia   . Arthritis    "knees"  . ESRD (end stage renal disease) ([place])   . Headache(784.0)   . High cholesterol   . Hypertension   . Nerve pain    "they say I have L5 nerve damage; my  lumbar"  . Pericardial effusion   . PVD (peripheral vascular disease) ([name])    Right leg stent in [place].  (No records)  . Type II diabetes mellitus [facility])     Patient Active Problem List   Diagnosis Date Noted  . Chest pain 07/07/2017  . Left ventricular hypertrophy 06/17/2017  . ESRD (end stage renal disease) ([place]) 05/21/2011  . Diabetic retinopathy 05/21/2011  . Diastolic dysfunction 54/62/7035  . Hyperlipidemia, mixed 01/30/2011  . Type II diabetes mellitus with complication, uncontrolled ([place]) 01/29/2011  . Neuropathy 01/29/2011  . Hypertension     Past Surgical History:  Procedure Laterality Date  . [facility]; 1997  . PERICARDIAL WINDOW  02/2004   for pericardial effusion  . TUBAL LIGATION  05/1995     OB History   None      Home Medications    Prior to Admission medications   Medication Sig Start Date End Date Taking? Authorizing Provider  amLODipine (NORVASC) 5 MG tablet TK 1 T PO BID 04/28/17   [provider]  atorvastatin (LIPITOR) 80 MG tablet Take 1 tablet (80 mg total) by mouth daily. Take 1/2 tab daily for one week, then increase to the full tab. 06/01/17   Tereasa Coop, PA-C  cinacalcet (SENSIPAR) 90 MG tablet Take 90 mg by mouth daily. 05/23/17   [provider]  DULoxetine (CYMBALTA) 30 MG  capsule Take 30 mg by mouth 2 (two) times daily.    [provider]  gabapentin (NEURONTIN) 100 MG capsule TK ONE C PO D 04/28/17   [provider]  HUMALOG MIX 75/25 KWIKPEN (75-25) 100 UNIT/ML Kwikpen Inject 25-35 Units into the skin 2 (two) times daily. Sliding Scale 06/27/17   [provider]  hydrOXYzine (ATARAX/VISTARIL) 25 MG tablet TK 1 T PO Q 12 H PRN 05/12/17   [provider]  lisinopril (PRINIVIL,ZESTRIL) 40 MG tablet TK 1 T PO D 03/17/17   [provider]  metoprolol succinate (TOPROL-XL) 25 MG 24 hr tablet Take 1 tablet (25 mg total) by mouth daily. 06/01/17   Tereasa Coop, PA-C    Multiple Vitamins-Minerals (RENAPLEX-D) TABS Take 1 tablet by mouth daily. 06/30/17   [provider]  oxyCODONE-acetaminophen (PERCOCET) 10-325 MG tablet TK 1 T PO QID PRF CHRONIC LUMBAR PAIN 05/16/17   [provider]  pramipexole (MIRAPEX) 0.5 MG tablet TK 1 T PO UTD 05/18/17   [provider]    Family History Family History  Problem Relation Age of Onset  . Cancer Brother   . Heart disease Father        Died age 56  . Diabetes Father   . Hyperlipidemia Father   . Hypertension Father   . Stroke Father   . Diabetes Mother   . Hypertension Mother   . Stroke Mother   . Dementia Mother   . Diabetes Sister   . Diabetes Brother   . Hypertension Sister   . Hypertension Brother     Social History Social History   Tobacco Use  . Smoking status: Current Every Day Smoker    Packs/day: 1.00    Years: 20.00    Pack years: 20.00    Types: Cigarettes  . Smokeless tobacco: Never Used  . Tobacco comment: She will try.    Substance Use Topics  . Alcohol use: No  . Drug use: No     Allergies   Patient has no known allergies.   Review of Systems Review of Systems  Constitutional: Negative for chills and fever.  Respiratory: Negative for shortness of breath.   Cardiovascular: Negative for chest pain.  Gastrointestinal: Positive for diarrhea. Negative for abdominal pain, blood in stool, constipation, nausea and vomiting.  Genitourinary:       +anuria  Musculoskeletal: Positive for arthralgias and myalgias. Negative for back pain and joint swelling.  Skin: Positive for rash.  All other systems reviewed and are negative.   Physical Exam Updated Vital Signs BP (!) 173/72 (BP Location: Right Arm)   Pulse 84   Temp 98.2 F (36.8 C) (Oral)   Resp 16   Ht 5\' 8"  (1.727 m)   Wt 110.7 kg (244 lb)   LMP 03/28/2011   SpO2 97%   BMI 37.10 kg/m   Physical Exam  Constitutional: She is oriented to person, place, and time. She appears well-developed  and well-nourished. No distress.  Calm, cooperative  HENT:  Head: Normocephalic and atraumatic.  Eyes: Pupils are equal, round, and reactive to light. Conjunctivae are normal. Right eye exhibits no discharge. Left eye exhibits no discharge. No scleral icterus.  Neck: Normal range of motion.  Cardiovascular: Normal rate and regular rhythm.  Pulmonary/Chest: Effort normal and breath sounds normal. No respiratory distress.  Abdominal: Soft. Bowel sounds are normal. She exhibits no distension. There is no tenderness.  Genitourinary:  Genitourinary Comments: Pelvic: No inguinal lymphadenopathy or inguinal hernia  noted. Normal external genitalia. No pain with speculum insertion. Closed cervical os with normal appearance - no rash or lesions. No significant discharge or bleeding noted from cervix or in vaginal vault - specifically, no stool. Chaperone present during exam.    Neurological: She is alert and oriented to person, place, and time.  Skin: Skin is warm and dry. Rash (Scattered, papular, hyperpigmented rash over the anterior L thigh) noted.  Psychiatric: She has a normal mood and affect. Her behavior is normal.  Nursing note and vitals reviewed.      ED Treatments / Results  Labs (all labs ordered are listed, but only abnormal results are displayed) Labs Reviewed  COMPREHENSIVE METABOLIC PANEL - Abnormal; Notable for the following components:      Result Value   Sodium 133 (*)    Potassium 5.3 (*)    Chloride 92 (*)    Glucose, Bld 318 (*)    BUN 74 (*)    Creatinine, Ser 9.86 (*)    Albumin 3.2 (*)    AST 13 (*)    GFR calc non Af Amer 4 (*)    GFR calc Af Amer 5 (*)    Anion gap 19 (*)    All other components within normal limits  CBC - Abnormal; Notable for the following components:   RBC 3.33 (*)    Hemoglobin 9.9 (*)    HCT 31.2 (*)    RDW 17.1 (*)    All other components within normal limits  LIPASE, BLOOD  URINALYSIS, ROUTINE W REFLEX MICROSCOPIC    EKG EKG  Interpretation  Date/Time:  Sunday September 04 2017 10:48:47 EDT Ventricular Rate:  58 PR Interval:  126 QRS Duration: 70 QT Interval:  386 QTC Calculation: 378 R Axis:   32 Text Interpretation:   Poor data quality, interpretation may be adversely affected Sinus bradycardia Otherwise normal ECG missing data, please discard EKG Reconfirmed by Quintella Reichert [phone]) on 09/04/2017 10:57:53 AM   Radiology No results found.  Procedures Procedures (including critical care time)  Medications Ordered in ED Medications - No data to display   Initial Impression / Assessment and Plan / ED Course  I have reviewed the triage vital signs and the nursing notes.  Pertinent labs & imaging results that were available during my care of the patient were reviewed by me and considered in my medical decision making (see chart for details).  52 year old female presents with diarrhea for 2 weeks and L arm pain for the past couple days. She is hypertensive but otherwise vitals are normal. On exam she does have diffuse tenderness to palpation of the shoulder. I think this is likely MSK in nature. No CP or SOB and no involvement with her fistula. She has no abdominal tenderness. Her pelvic exam is normal. Her labs are overall at baseline. CBC is remarkable for anemia. CMP has multiple derangements, notably mild hyperkalemia (5.3). She is scheduled to have dialysis tomorrow and has no other signs suggesting need for emergent dialysis. She was observed for several hours and was unable to produce a BM. She currently doesn't have a PCP. She was given material for collection of a stool sample if she continues to have symptoms. She can follow up at dialysis tomorrow and she was encouraged to establish care with a PCP.  Final Clinical Impressions(s) / ED Diagnoses   Final diagnoses:  Diarrhea, unspecified type  Acute pain of left shoulder    ED Discharge Orders  None       Recardo Evangelist, PA-C 09/04/17  1608    Quintella Reichert, MD 09/06/17 [phone]

## 2017-09-04 NOTE — ED Triage Notes (Signed)
Pt. Stated, Ive had diarrhea and left arm pain for 2 weeks.

## 2017-09-04 NOTE — Discharge Instructions (Signed)
Please collect diarrhea when you can and you can bring it to the ED for testing Please follow up for dialysis tomorrow Establish care with a PCP Return if you are worsening

## 2017-09-04 NOTE — ED Notes (Signed)
Bag lunch given with Ginger-Ale

## 2017-09-06 ENCOUNTER — Emergency Department (HOSPITAL_COMMUNITY)
Admission: EM | Admit: 2017-09-06 | Discharge: 2017-09-06 | Disposition: A | Discharge status: Home / Self Care | Payer: Medicare Other | Attending: Emergency Medicine | Admitting: Emergency Medicine

## 2017-09-06 ENCOUNTER — Encounter (HOSPITAL_COMMUNITY): Payer: Self-pay | Admitting: Emergency Medicine

## 2017-09-06 ENCOUNTER — Other Ambulatory Visit: Payer: Self-pay

## 2017-09-06 DIAGNOSIS — R197 Diarrhea, unspecified: Secondary | ICD-10-CM | POA: Diagnosis present

## 2017-09-06 DIAGNOSIS — Z992 Dependence on renal dialysis: Secondary | ICD-10-CM | POA: Insufficient documentation

## 2017-09-06 DIAGNOSIS — I120 Hypertensive chronic kidney disease with stage 5 chronic kidney disease or end stage renal disease: Secondary | ICD-10-CM | POA: Diagnosis not present

## 2017-09-06 DIAGNOSIS — F17210 Nicotine dependence, cigarettes, uncomplicated: Secondary | ICD-10-CM | POA: Insufficient documentation

## 2017-09-06 DIAGNOSIS — N186 End stage renal disease: Secondary | ICD-10-CM | POA: Diagnosis not present

## 2017-09-06 DIAGNOSIS — E119 Type 2 diabetes mellitus without complications: Secondary | ICD-10-CM | POA: Diagnosis not present

## 2017-09-06 DIAGNOSIS — Z79899 Other long term (current) drug therapy: Secondary | ICD-10-CM | POA: Insufficient documentation

## 2017-09-06 LAB — BASIC METABOLIC PANEL
ANION GAP: 19 — AB (ref 5–15)
BUN: 117 mg/dL — ABNORMAL HIGH (ref 6–20)
CO2: 21 mmol/L — ABNORMAL LOW (ref 22–32)
Calcium: 8.8 mg/dL — ABNORMAL LOW (ref 8.9–10.3)
Chloride: 93 mmol/L — ABNORMAL LOW (ref 98–111)
Creatinine, Ser: 12.19 mg/dL — ABNORMAL HIGH (ref 0.44–1.00)
GFR, EST AFRICAN AMERICAN: 4 mL/min — AB (ref >60)
GFR, EST NON AFRICAN AMERICAN: 3 mL/min — AB (ref >60)
GLUCOSE: 136 mg/dL — AB (ref 70–99)
POTASSIUM: 5.2 mmol/L — AB (ref 3.5–5.1)
SODIUM: 133 mmol/L — AB (ref 135–145)

## 2017-09-06 LAB — CBC WITH DIFFERENTIAL/PLATELET
ABS IMMATURE GRANULOCYTES: 0.1 10*3/uL (ref 0.0–0.1)
BASOS ABS: 0 10*3/uL (ref 0.0–0.1)
BASOS PCT: 0 %
Eosinophils Absolute: 0.3 10*3/uL (ref 0.0–0.7)
Eosinophils Relative: 5 %
HCT: 31.2 % — ABNORMAL LOW (ref 36.0–46.0)
HEMOGLOBIN: 9.8 g/dL — AB (ref 12.0–15.0)
Immature Granulocytes: 1 %
LYMPHS PCT: 19 %
Lymphs Abs: 1.4 10*3/uL (ref 0.7–4.0)
MCH: 29.3 pg (ref 26.0–34.0)
MCHC: 31.4 g/dL (ref 30.0–36.0)
MCV: 93.1 fL (ref 78.0–100.0)
Monocytes Absolute: 0.4 10*3/uL (ref 0.1–1.0)
Monocytes Relative: 5 %
NEUTROS ABS: 5.2 10*3/uL (ref 1.7–7.7)
NEUTROS PCT: 70 %
PLATELETS: 221 10*3/uL (ref 150–400)
RBC: 3.35 MIL/uL — AB (ref 3.87–5.11)
RDW: 17.2 % — ABNORMAL HIGH (ref 11.5–15.5)
WBC: 7.4 10*3/uL (ref 4.0–10.5)

## 2017-09-06 LAB — C DIFFICILE QUICK SCREEN W PCR REFLEX
C DIFFICILE (CDIFF) TOXIN: NEGATIVE
C DIFFICLE (CDIFF) ANTIGEN: POSITIVE — AB

## 2017-09-06 LAB — GASTROINTESTINAL PANEL BY PCR, STOOL (REPLACES STOOL CULTURE)

## 2017-09-06 LAB — CLOSTRIDIUM DIFFICILE BY PCR, REFLEXED: Toxigenic C. Difficile by PCR: NEGATIVE

## 2017-09-06 MED ORDER — DIPHENOXYLATE-ATROPINE 2.5-0.025 MG PO TABS
1.0000 | ORAL_TABLET | Freq: Once | ORAL | Status: AC
  Administered 2017-09-06: 1 via ORAL
  Filled 2017-09-06: qty 1

## 2017-09-06 MED ORDER — DIPHENOXYLATE-ATROPINE 2.5-0.025 MG PO TABS: 1.0000 | ORAL_TABLET | Freq: Four times a day (QID) | ORAL | 0 refills | Status: DC | PRN

## 2017-09-06 NOTE — ED Notes (Signed)
Pt ambulated to restroom without distress.  

## 2017-09-06 NOTE — ED Notes (Signed)
ED Provider at bedside. 

## 2017-09-06 NOTE — ED Provider Notes (Signed)
[facility] EMERGENCY DEPARTMENT Provider Note   CSN: [id number] Arrival date & time: 09/06/17  4034     History   Chief Complaint Chief Complaint  Patient presents with  . Diarrhea    HPI [name] is a 52 y.o. female.  HPI [name] is a 52 y.o. female with history of hypertension, diabetes, end-stage renal disease on dialysis, anemia, presents to emergency department complaining of diarrhea.  Patient states she has had diarrhea for 2 weeks.  She was seen here 2 days ago for the same, she was unable to provide a stool sample and she was sent home with a jar.  She is back with a stool sample, and states that she would like [place] to do something about her diarrhea since she has not missed dialysis twice.  She states she has at least 2 bowel movements every hour.  She describes it as sometimes chunky but mostly watery.  She denies any fever or chills.  No nausea or vomiting.  No abdominal pain.  She states that they did start her on the new medication and dialysis but she does not know the name of it and believes the diarrhea may have started right after.  She denies any changes in her diet.  She has tried taking Pepto-Bismol and Imodium to help her diarrhea with no relief. Last dialysis 5 days ago.   Past Medical History:  Diagnosis Date  . Anemia   . Arthritis    "knees"  . ESRD (end stage renal disease) ([place])   . Headache(784.0)   . High cholesterol   . Hypertension   . 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 Pericardial effusion   . PVD (peripheral vascular disease) ([place])    Right leg stent in [place].  (No records)  . Type II diabetes mellitus [facility])     Patient Active Problem List   Diagnosis Date Noted  . Chest pain 07/07/2017  . Left ventricular hypertrophy 06/17/2017  . ESRD (end stage renal disease) ([place]) 05/21/2011  . Diabetic retinopathy 05/21/2011  . Diastolic dysfunction 74/25/9563  . Hyperlipidemia, mixed  01/30/2011  . Type II diabetes mellitus with complication, uncontrolled ([place]) 01/29/2011  . Neuropathy 01/29/2011  . Hypertension     Past Surgical History:  Procedure Laterality Date  . [place]; 1997  . PERICARDIAL WINDOW  02/2004   for pericardial effusion  . TUBAL LIGATION  05/1995     OB History   None      Home Medications    Prior to Admission medications   Medication Sig Start Date End Date Taking? Authorizing Provider  amLODipine (NORVASC) 5 MG tablet Take 5mg  by mouth twice daily 04/28/17  Yes [provider]  atorvastatin (LIPITOR) 80 MG tablet Take 1 tablet (80 mg total) by mouth daily. Take 1/2 tab daily for one week, then increase to the full tab. 06/01/17  Yes Tereasa Coop, PA-C  cinacalcet (SENSIPAR) 90 MG tablet Take 90 mg by mouth daily. 05/23/17  Yes [provider]  gabapentin (NEURONTIN) 100 MG capsule TK ONE capsule three times daily 04/28/17  Yes [provider]  HUMALOG MIX 75/25 KWIKPEN (75-25) 100 UNIT/ML Kwikpen Inject 25 Units into the skin 2 (two) times daily. Sliding Scale 06/27/17  Yes [provider]  hydrOXYzine (ATARAX/VISTARIL) 25 MG tablet take 25mg  by mouth once daily 05/12/17  Yes [provider]  lisinopril (PRINIVIL,ZESTRIL) 40 MG  tablet Take 40mg  by mouth once daily 03/17/17  Yes [provider]  metoprolol succinate (TOPROL-XL) 25 MG 24 hr tablet Take 1 tablet (25 mg total) by mouth daily. 06/01/17  Yes Tereasa Coop, PA-C  oxyCODONE (ROXICODONE) 15 MG immediate release tablet Take 15 mg by mouth 3 (three) times daily as needed for pain.  08/24/17  Yes [provider]  pramipexole (MIRAPEX) 0.5 MG tablet Take 0.5mg  by mouth once daily 05/18/17  Yes [provider]    Family History Family History  Problem Relation Age of Onset  . Cancer Brother   . Heart disease Father        Died age 20  . Diabetes Father   . Hyperlipidemia Father   . Hypertension  Father   . Stroke Father   . Diabetes Mother   . Hypertension Mother   . Stroke Mother   . Dementia Mother   . Diabetes Sister   . Diabetes Brother   . Hypertension Sister   . Hypertension Brother     Social History Social History   Tobacco Use  . Smoking status: Current Every Day Smoker    Packs/day: 1.00    Years: 20.00    Pack years: 20.00    Types: Cigarettes  . Smokeless tobacco: Never Used  . Tobacco comment: She will try.    Substance Use Topics  . Alcohol use: No  . Drug use: No     Allergies   Patient has no known allergies.   Review of Systems Review of Systems  Constitutional: Negative for chills and fever.  Respiratory: Negative for cough, chest tightness and shortness of breath.   Cardiovascular: Negative for chest pain, palpitations and leg swelling.  Gastrointestinal: Positive for diarrhea. Negative for abdominal pain, nausea and vomiting.  Genitourinary: Negative for dysuria, flank pain and pelvic pain.  Musculoskeletal: Negative for arthralgias, myalgias, neck pain and neck stiffness.  Skin: Negative for rash.  Neurological: Negative for dizziness, weakness and headaches.  All other systems reviewed and are negative.    Physical Exam Updated Vital Signs BP 131/70 (BP Location: Right Arm)   Pulse 73   Temp 97.9 F (36.6 C) (Oral)   Resp 16   Ht 5\' 8"  (1.727 m)   Wt 111.1 kg (245 lb)   LMP 03/28/2011   SpO2 100%   BMI 37.25 kg/m   Physical Exam  Constitutional: She is oriented to person, place, and time. She appears well-developed and well-nourished. No distress.  HENT:  Head: Normocephalic.  Eyes: Conjunctivae are normal.  Neck: Neck supple.  Cardiovascular: Normal rate, regular rhythm and normal heart sounds.  Pulmonary/Chest: Effort normal and breath sounds normal. No respiratory distress. She has no wheezes. She has no rales.  Abdominal: Soft. Bowel sounds are normal. She exhibits no distension. There is no tenderness. There is  no rebound.  Musculoskeletal: She exhibits no edema.  Neurological: She is alert and oriented to person, place, and time.  Skin: Skin is warm and dry.  Psychiatric: She has a normal mood and affect. Her behavior is normal.  Nursing note and vitals reviewed.    ED Treatments / Results  Labs (all labs ordered are listed, but only abnormal results are displayed) Labs Reviewed  C DIFFICILE QUICK SCREEN W PCR REFLEX - Abnormal; Notable for the following components:      Result Value   C Diff antigen POSITIVE (*)    All other components within normal limits  CBC WITH DIFFERENTIAL/PLATELET -  Abnormal; Notable for the following components:   RBC 3.35 (*)    Hemoglobin 9.8 (*)    HCT 31.2 (*)    RDW 17.2 (*)    All other components within normal limits  BASIC METABOLIC PANEL - Abnormal; Notable for the following components:   Sodium 133 (*)    Potassium 5.2 (*)    Chloride 93 (*)    CO2 21 (*)    Glucose, Bld 136 (*)    BUN 117 (*)    Creatinine, Ser 12.19 (*)    Calcium 8.8 (*)    GFR calc non Af Amer 3 (*)    GFR calc Af Amer 4 (*)    Anion gap 19 (*)    All other components within normal limits  GASTROINTESTINAL PANEL BY PCR, STOOL (REPLACES STOOL CULTURE)  CLOSTRIDIUM DIFFICILE BY PCR, REFLEXED    EKG None  Radiology No results found.  Procedures Procedures (including critical care time)  Medications Ordered in ED Medications  diphenoxylate-atropine (LOMOTIL) 2.5-0.025 MG per tablet 1 tablet (has no administration in time range)     Initial Impression / Assessment and Plan / ED Course  I have reviewed the triage vital signs and the nursing notes.  Pertinent labs & imaging results that were available during my care of the patient were reviewed by me and considered in my medical decision making (see chart for details).     Patient in emergency department with persistent diarrhea for 2 weeks.  She has unremarkable exam, specifically there is no abdominal pain  or tenderness.  She is not nauseated or vomiting.  She is able to eat normally.  Her main concern is that she is unable to go to dialysis because of this persistent diarrhea.  She has a bowel movement every 30 minutes or so.  She brought a stool sample with her, stool appears to be greenish in color, liquid with some food particles.  We will send it for cultures and C. difficile.  I will check basic labs since she has not had dialysis in 5 days.   12:15 PM Patient has only had one bowel movement since being here.  Her vital signs are normal.  Labs show persistent anemia, her electrolytes are at baseline except for BUN and creatinine strongly high today due to her not having dialysis in almost a week.  I discussed with patient importance of having dialysis.  I spoke with her center where she was supposed to go this morning, they were able to get her in this afternoon.  I will treat her with Lomotil, and will be discharging her home.  Her stool cultures are pending and we will follow-up on those.  Return precautions discussed.  Vitals:   09/06/17 0910 09/06/17 1100 09/06/17 1115 09/06/17 1145  BP: 131/70 (!) 160/93 (!) 145/77 (!) 142/70  Pulse: 73 71 69 75  Resp: 16     Temp: 97.9 F (36.6 C)     TempSrc: Oral     SpO2: 100% 96% 95% 96%  Weight: 111.1 kg (245 lb)     Height: 5\' 8"  (1.727 m)        Final Clinical Impressions(s) / ED Diagnoses   Final diagnoses:  Diarrhea, unspecified type    ED Discharge Orders    None       Jeannett Senior, PA-C 09/06/17 1219    Tegeler, Gwenyth Allegra, MD 09/06/17 [phone]

## 2017-09-06 NOTE — Discharge Instructions (Addendum)
Take Lomotil as prescribed for your diarrhea.  Please follow-up with a family doctor.  Please go straight to dialysis today.  Return if worsening symptoms.

## 2017-09-06 NOTE — ED Triage Notes (Signed)
Pt. Stated, I was here and suppose to get a stool sample and could not so I got one at home and Im still having diarrhea.

## 2017-09-22 DIAGNOSIS — L299 Pruritus, unspecified: Secondary | ICD-10-CM | POA: Insufficient documentation

## 2017-09-25 ENCOUNTER — Observation Stay (HOSPITAL_COMMUNITY)
Admission: EM | Admit: 2017-09-25 | Discharge: 2017-09-27 | Disposition: A | Discharge status: Home / Self Care | Payer: Medicare Other | Attending: Internal Medicine | Admitting: Internal Medicine

## 2017-09-25 ENCOUNTER — Emergency Department (HOSPITAL_COMMUNITY): Payer: Medicare Other

## 2017-09-25 ENCOUNTER — Encounter (HOSPITAL_COMMUNITY): Payer: Self-pay

## 2017-09-25 DIAGNOSIS — N2581 Secondary hyperparathyroidism of renal origin: Secondary | ICD-10-CM | POA: Diagnosis not present

## 2017-09-25 DIAGNOSIS — D631 Anemia in chronic kidney disease: Secondary | ICD-10-CM | POA: Insufficient documentation

## 2017-09-25 DIAGNOSIS — D649 Anemia, unspecified: Secondary | ICD-10-CM | POA: Diagnosis present

## 2017-09-25 DIAGNOSIS — M4802 Spinal stenosis, cervical region: Secondary | ICD-10-CM | POA: Diagnosis not present

## 2017-09-25 DIAGNOSIS — Z79899 Other long term (current) drug therapy: Secondary | ICD-10-CM | POA: Insufficient documentation

## 2017-09-25 DIAGNOSIS — E782 Mixed hyperlipidemia: Secondary | ICD-10-CM | POA: Diagnosis not present

## 2017-09-25 DIAGNOSIS — Z823 Family history of stroke: Secondary | ICD-10-CM | POA: Diagnosis not present

## 2017-09-25 DIAGNOSIS — M47812 Spondylosis without myelopathy or radiculopathy, cervical region: Secondary | ICD-10-CM | POA: Insufficient documentation

## 2017-09-25 DIAGNOSIS — Z8249 Family history of ischemic heart disease and other diseases of the circulatory system: Secondary | ICD-10-CM | POA: Insufficient documentation

## 2017-09-25 DIAGNOSIS — M50222 Other cervical disc displacement at C5-C6 level: Secondary | ICD-10-CM | POA: Insufficient documentation

## 2017-09-25 DIAGNOSIS — E7800 Pure hypercholesterolemia, unspecified: Secondary | ICD-10-CM | POA: Diagnosis not present

## 2017-09-25 DIAGNOSIS — F17210 Nicotine dependence, cigarettes, uncomplicated: Secondary | ICD-10-CM | POA: Diagnosis not present

## 2017-09-25 DIAGNOSIS — E1169 Type 2 diabetes mellitus with other specified complication: Secondary | ICD-10-CM | POA: Diagnosis present

## 2017-09-25 DIAGNOSIS — I10 Essential (primary) hypertension: Secondary | ICD-10-CM | POA: Diagnosis present

## 2017-09-25 DIAGNOSIS — G8929 Other chronic pain: Secondary | ICD-10-CM | POA: Diagnosis not present

## 2017-09-25 DIAGNOSIS — M542 Cervicalgia: Secondary | ICD-10-CM | POA: Diagnosis present

## 2017-09-25 DIAGNOSIS — Z833 Family history of diabetes mellitus: Secondary | ICD-10-CM | POA: Insufficient documentation

## 2017-09-25 DIAGNOSIS — E1151 Type 2 diabetes mellitus with diabetic peripheral angiopathy without gangrene: Secondary | ICD-10-CM | POA: Diagnosis not present

## 2017-09-25 DIAGNOSIS — Z992 Dependence on renal dialysis: Secondary | ICD-10-CM | POA: Insufficient documentation

## 2017-09-25 DIAGNOSIS — I120 Hypertensive chronic kidney disease with stage 5 chronic kidney disease or end stage renal disease: Secondary | ICD-10-CM | POA: Insufficient documentation

## 2017-09-25 DIAGNOSIS — Z809 Family history of malignant neoplasm, unspecified: Secondary | ICD-10-CM | POA: Insufficient documentation

## 2017-09-25 DIAGNOSIS — E11319 Type 2 diabetes mellitus with unspecified diabetic retinopathy without macular edema: Secondary | ICD-10-CM | POA: Diagnosis not present

## 2017-09-25 DIAGNOSIS — N186 End stage renal disease: Secondary | ICD-10-CM

## 2017-09-25 DIAGNOSIS — M549 Dorsalgia, unspecified: Secondary | ICD-10-CM | POA: Diagnosis not present

## 2017-09-25 DIAGNOSIS — E785 Hyperlipidemia, unspecified: Secondary | ICD-10-CM | POA: Diagnosis present

## 2017-09-25 DIAGNOSIS — D72829 Elevated white blood cell count, unspecified: Secondary | ICD-10-CM | POA: Diagnosis not present

## 2017-09-25 DIAGNOSIS — E118 Type 2 diabetes mellitus with unspecified complications: Secondary | ICD-10-CM

## 2017-09-25 DIAGNOSIS — M25552 Pain in left hip: Secondary | ICD-10-CM | POA: Diagnosis present

## 2017-09-25 DIAGNOSIS — Z794 Long term (current) use of insulin: Secondary | ICD-10-CM | POA: Insufficient documentation

## 2017-09-25 DIAGNOSIS — E1122 Type 2 diabetes mellitus with diabetic chronic kidney disease: Secondary | ICD-10-CM | POA: Diagnosis present

## 2017-09-25 DIAGNOSIS — I00 Rheumatic fever without heart involvement: Secondary | ICD-10-CM | POA: Diagnosis not present

## 2017-09-25 DIAGNOSIS — E1165 Type 2 diabetes mellitus with hyperglycemia: Secondary | ICD-10-CM | POA: Diagnosis present

## 2017-09-25 DIAGNOSIS — M1990 Unspecified osteoarthritis, unspecified site: Secondary | ICD-10-CM | POA: Diagnosis not present

## 2017-09-25 DIAGNOSIS — M2578 Osteophyte, vertebrae: Secondary | ICD-10-CM | POA: Insufficient documentation

## 2017-09-25 DIAGNOSIS — E1159 Type 2 diabetes mellitus with other circulatory complications: Secondary | ICD-10-CM | POA: Diagnosis present

## 2017-09-25 DIAGNOSIS — E8889 Other specified metabolic disorders: Secondary | ICD-10-CM | POA: Diagnosis not present

## 2017-09-25 LAB — CBC WITH DIFFERENTIAL/PLATELET
Abs Immature Granulocytes: 0.1 10*3/uL (ref 0.0–0.1)
Basophils Absolute: 0.1 10*3/uL (ref 0.0–0.1)
Basophils Relative: 1 %
EOS ABS: 0.3 10*3/uL (ref 0.0–0.7)
EOS PCT: 2 %
HEMATOCRIT: 32.4 % — AB (ref 36.0–46.0)
HEMOGLOBIN: 10.3 g/dL — AB (ref 12.0–15.0)
Immature Granulocytes: 1 %
LYMPHS ABS: 1.5 10*3/uL (ref 0.7–4.0)
LYMPHS PCT: 13 %
MCH: 30.4 pg (ref 26.0–34.0)
MCHC: 31.8 g/dL (ref 30.0–36.0)
MCV: 95.6 fL (ref 78.0–100.0)
Monocytes Absolute: 0.9 10*3/uL (ref 0.1–1.0)
Monocytes Relative: 8 %
Neutro Abs: 9.1 10*3/uL — ABNORMAL HIGH (ref 1.7–7.7)
Neutrophils Relative %: 75 %
Platelets: 279 10*3/uL (ref 150–400)
RBC: 3.39 MIL/uL — ABNORMAL LOW (ref 3.87–5.11)
RDW: 17.2 % — AB (ref 11.5–15.5)
WBC: 11.9 10*3/uL — ABNORMAL HIGH (ref 4.0–10.5)

## 2017-09-25 LAB — SEDIMENTATION RATE: SED RATE: 138 mm/h — AB (ref 0–22)

## 2017-09-25 LAB — COMPREHENSIVE METABOLIC PANEL
ALT: 18 U/L (ref 0–44)
ANION GAP: 20 — AB (ref 5–15)
AST: 22 U/L (ref 15–41)
Albumin: 3.1 g/dL — ABNORMAL LOW (ref 3.5–5.0)
Alkaline Phosphatase: 72 U/L (ref 38–126)
BILIRUBIN TOTAL: 0.5 mg/dL (ref 0.3–1.2)
BUN: 47 mg/dL — AB (ref 6–20)
CALCIUM: 9.7 mg/dL (ref 8.9–10.3)
CO2: 24 mmol/L (ref 22–32)
Chloride: 89 mmol/L — ABNORMAL LOW (ref 98–111)
Creatinine, Ser: 7.08 mg/dL — ABNORMAL HIGH (ref 0.44–1.00)
GFR calc Af Amer: 7 mL/min — ABNORMAL LOW (ref >60)
GFR, EST NON AFRICAN AMERICAN: 6 mL/min — AB (ref >60)
GLUCOSE: 236 mg/dL — AB (ref 70–99)
POTASSIUM: 4.6 mmol/L (ref 3.5–5.1)
Sodium: 133 mmol/L — ABNORMAL LOW (ref 135–145)
Total Protein: 8.4 g/dL — ABNORMAL HIGH (ref 6.5–8.1)

## 2017-09-25 MED ORDER — OXYCODONE-ACETAMINOPHEN 5-325 MG PO TABS
2.0000 | ORAL_TABLET | Freq: Once | ORAL | Status: AC
  Administered 2017-09-25: 2 via ORAL
  Filled 2017-09-25: qty 2

## 2017-09-25 MED ORDER — SODIUM CHLORIDE 0.9 % IV SOLN: INTRAVENOUS | Status: DC

## 2017-09-25 MED ORDER — DIAZEPAM 5 MG PO TABS
10.0000 mg | ORAL_TABLET | Freq: Once | ORAL | Status: AC
  Administered 2017-09-25: 10 mg via ORAL
  Filled 2017-09-25: qty 2

## 2017-09-25 NOTE — ED Triage Notes (Signed)
Pt presents for evaluation of neck pain and L leg pain. States her pain started in R arm and improved but now she is having severe pain to neck and she cant lift head due to pain. States L hip hurts as well x 1-2 weeks. Denies fall or trauma.

## 2017-09-25 NOTE — ED Notes (Signed)
Pt returned from MRI °

## 2017-09-25 NOTE — ED Provider Notes (Signed)
[facility] EMERGENCY DEPARTMENT Provider Note   CSN: [id number] Arrival date & time: 09/25/17  1314     History   Chief Complaint Chief Complaint  Patient presents with  . Neck Pain  . Leg Pain    HPI [name] is a 52 y.o. female.  52 year old female presents with pain at the base of her neck which radiates to her trapezius muscle as well as some left hip discomfort.  Left hip discomfort is dull and worse with any walking or movement.  Denies any associated left knee or left ankle pain.  No associated back discomfort.  No bowel or bladder dysfunction.  Neck discomfort is worse to palpation without radiation to her arms.  No associated dyspnea or diaphoresis.  Symptoms have been for several weeks but got worse today.  Has had this before in the past but was never given a diagnosis.  Denies any fever or chills.  No history of trauma.  Patient was last dialyzed yesterday and went for a full session.  Symptoms better with remaining still with no treatment used prior to arrival.     Past Medical History:  Diagnosis Date  . Anemia   . Arthritis    "knees"  . ESRD (end stage renal disease) ([place])   . Headache(784.0)   . High cholesterol   . Hypertension   . 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 Pericardial effusion   . PVD (peripheral vascular disease) ([place])    Right leg stent in [place].  (No records)  . Type II diabetes mellitus [facility])     Patient Active Problem List   Diagnosis Date Noted  . Chest pain 07/07/2017  . Left ventricular hypertrophy 06/17/2017  . ESRD (end stage renal disease) ([place]) 05/21/2011  . Diabetic retinopathy 05/21/2011  . Diastolic dysfunction 84/69/6295  . Hyperlipidemia, mixed 01/30/2011  . Type II diabetes mellitus with complication, uncontrolled ([place]) 01/29/2011  . Neuropathy 01/29/2011  . Hypertension     Past Surgical History:  Procedure Laterality Date  . [place]; 1997    . PERICARDIAL WINDOW  02/2004   for pericardial effusion  . TUBAL LIGATION  05/1995     OB History   None      Home Medications    Prior to Admission medications   Medication Sig Start Date End Date Taking? Authorizing Provider  amLODipine (NORVASC) 5 MG tablet Take 5mg  by mouth twice daily 04/28/17   [provider]  atorvastatin (LIPITOR) 80 MG tablet Take 1 tablet (80 mg total) by mouth daily. Take 1/2 tab daily for one week, then increase to the full tab. 06/01/17   Tereasa Coop, PA-C  cinacalcet (SENSIPAR) 90 MG tablet Take 90 mg by mouth daily. 05/23/17   [provider]  diphenoxylate-atropine (LOMOTIL) 2.5-0.025 MG tablet Take 1 tablet by mouth 4 (four) times daily as needed for diarrhea or loose stools. 09/06/17   Kirichenko, Lahoma Rocker, PA-C  gabapentin (NEURONTIN) 100 MG capsule TK ONE capsule three times daily 04/28/17   [provider]  HUMALOG MIX 75/25 KWIKPEN (75-25) 100 UNIT/ML Kwikpen Inject 25 Units into the skin 2 (two) times daily. Sliding Scale 06/27/17   [provider]  hydrOXYzine (ATARAX/VISTARIL) 25 MG tablet take 25mg  by mouth once daily 05/12/17   [provider]  lisinopril (PRINIVIL,ZESTRIL) 40 MG tablet Take 40mg  by mouth once daily 03/17/17   [provider]  metoprolol succinate (  TOPROL-XL) 25 MG 24 hr tablet Take 1 tablet (25 mg total) by mouth daily. 06/01/17   Tereasa Coop, PA-C  oxyCODONE (ROXICODONE) 15 MG immediate release tablet Take 15 mg by mouth 3 (three) times daily as needed for pain.  08/24/17   [provider]  pramipexole (MIRAPEX) 0.5 MG tablet Take 0.5mg  by mouth once daily 05/18/17   [provider]    Family History Family History  Problem Relation Age of Onset  . Cancer Brother   . Heart disease Father        Died age 58  . Diabetes Father   . Hyperlipidemia Father   . Hypertension Father   . Stroke Father   . Diabetes Mother   . Hypertension Mother   . Stroke  Mother   . Dementia Mother   . Diabetes Sister   . Diabetes Brother   . Hypertension Sister   . Hypertension Brother     Social History Social History   Tobacco Use  . Smoking status: Current Every Day Smoker    Packs/day: 1.00    Years: 20.00    Pack years: 20.00    Types: Cigarettes  . Smokeless tobacco: Never Used  . Tobacco comment: She will try.    Substance Use Topics  . Alcohol use: No  . Drug use: No     Allergies   Patient has no known allergies.   Review of Systems Review of Systems  All other systems reviewed and are negative.    Physical Exam Updated Vital Signs BP 91/64 (BP Location: Right Arm)   Pulse 84   Temp 98.4 F (36.9 C) (Oral)   Resp 16   LMP 03/28/2011   SpO2 93%   Physical Exam  Constitutional: She is oriented to person, place, and time. She appears well-developed and well-nourished.  Non-toxic appearance. No distress.  HENT:  Head: Normocephalic and atraumatic.  Eyes: Pupils are equal, round, and reactive to light. Conjunctivae, EOM and lids are normal.  Neck: Normal range of motion. Neck supple. No tracheal deviation present. No thyroid mass present.  Cardiovascular: Normal rate, regular rhythm and normal heart sounds. Exam reveals no gallop.  No murmur heard. Pulmonary/Chest: Effort normal and breath sounds normal. No stridor. No respiratory distress. She has no decreased breath sounds. She has no wheezes. She has no rhonchi. She has no rales.  Abdominal: Soft. Normal appearance and bowel sounds are normal. She exhibits no distension. There is no tenderness. There is no rebound and no CVA tenderness.  Musculoskeletal: She exhibits no edema.       Left hip: She exhibits decreased range of motion and tenderness. She exhibits normal strength and no bony tenderness.       Back:  Neurovascular intact at the left foot  Neurological: She is alert and oriented to person, place, and time. She has normal strength. No cranial nerve deficit  or sensory deficit. GCS eye subscore is 4. GCS verbal subscore is 5. GCS motor subscore is 6.  Skin: Skin is warm and dry. No abrasion and no rash noted.  Psychiatric: She has a normal mood and affect. Her speech is normal and behavior is normal.  Nursing note and vitals reviewed.    ED Treatments / Results  Labs (all labs ordered are listed, but only abnormal results are displayed) Labs Reviewed  CBC WITH DIFFERENTIAL/PLATELET  COMPREHENSIVE METABOLIC PANEL  SEDIMENTATION RATE    EKG None  Radiology No results found.  Procedures Procedures (including  critical care time)  Medications Ordered in ED Medications  diazepam (VALIUM) tablet 10 mg (has no administration in time range)  oxyCODONE-acetaminophen (PERCOCET/ROXICET) 5-325 MG per tablet 2 tablet (has no administration in time range)     Initial Impression / Assessment and Plan / ED Course  I have reviewed the triage vital signs and the nursing notes.  Pertinent labs & imaging results that were available during my care of the patient were reviewed by me and considered in my medical decision making (see chart for details).     Patient medicated for pain here and was feeling better initially but then when I woke her up she had severe left hip pain.  When I tried to range it she expressed that she had lots of pain.  Concern for possible septic joint due to her elevated sed rate.  Also possible occult fracture.  MRI of left hip per radiology did not show any acute findings.  Went back to assess the patient as she complains of severe pain to her neck that goes to her arms at times.  Will order MRI of her cervical spine and care signed out to next provider  Final Clinical Impressions(s) / ED Diagnoses   Final diagnoses:  None    ED Discharge Orders    None       Lacretia Leigh, MD 09/25/17 2209

## 2017-09-25 NOTE — ED Notes (Signed)
[phone], patricia williams- sister, please call with update.

## 2017-09-25 NOTE — ED Notes (Signed)
Patient transported to MRI 

## 2017-09-26 ENCOUNTER — Other Ambulatory Visit: Payer: Self-pay

## 2017-09-26 ENCOUNTER — Encounter (HOSPITAL_COMMUNITY): Payer: Self-pay | Admitting: General Practice

## 2017-09-26 DIAGNOSIS — I10 Essential (primary) hypertension: Secondary | ICD-10-CM | POA: Diagnosis not present

## 2017-09-26 DIAGNOSIS — M542 Cervicalgia: Secondary | ICD-10-CM | POA: Diagnosis not present

## 2017-09-26 DIAGNOSIS — N186 End stage renal disease: Secondary | ICD-10-CM | POA: Diagnosis not present

## 2017-09-26 DIAGNOSIS — M25552 Pain in left hip: Secondary | ICD-10-CM | POA: Diagnosis present

## 2017-09-26 DIAGNOSIS — D72829 Elevated white blood cell count, unspecified: Secondary | ICD-10-CM | POA: Diagnosis present

## 2017-09-26 DIAGNOSIS — E118 Type 2 diabetes mellitus with unspecified complications: Secondary | ICD-10-CM

## 2017-09-26 DIAGNOSIS — E782 Mixed hyperlipidemia: Secondary | ICD-10-CM

## 2017-09-26 DIAGNOSIS — I00 Rheumatic fever without heart involvement: Secondary | ICD-10-CM | POA: Diagnosis not present

## 2017-09-26 DIAGNOSIS — Z992 Dependence on renal dialysis: Secondary | ICD-10-CM

## 2017-09-26 DIAGNOSIS — D649 Anemia, unspecified: Secondary | ICD-10-CM | POA: Diagnosis present

## 2017-09-26 DIAGNOSIS — E1165 Type 2 diabetes mellitus with hyperglycemia: Secondary | ICD-10-CM

## 2017-09-26 LAB — BASIC METABOLIC PANEL
ANION GAP: 20 — AB (ref 5–15)
BUN: 54 mg/dL — ABNORMAL HIGH (ref 6–20)
CALCIUM: 9.2 mg/dL (ref 8.9–10.3)
CO2: 24 mmol/L (ref 22–32)
Chloride: 87 mmol/L — ABNORMAL LOW (ref 98–111)
Creatinine, Ser: 7.78 mg/dL — ABNORMAL HIGH (ref 0.44–1.00)
GFR, EST AFRICAN AMERICAN: 6 mL/min — AB (ref >60)
GFR, EST NON AFRICAN AMERICAN: 5 mL/min — AB (ref >60)
Glucose, Bld: 167 mg/dL — ABNORMAL HIGH (ref 70–99)
Potassium: 4.6 mmol/L (ref 3.5–5.1)
SODIUM: 131 mmol/L — AB (ref 135–145)

## 2017-09-26 LAB — CBC
HCT: 31 % — ABNORMAL LOW (ref 36.0–46.0)
HEMOGLOBIN: 9.7 g/dL — AB (ref 12.0–15.0)
MCH: 29.8 pg (ref 26.0–34.0)
MCHC: 31.3 g/dL (ref 30.0–36.0)
MCV: 95.1 fL (ref 78.0–100.0)
PLATELETS: 304 10*3/uL (ref 150–400)
RBC: 3.26 MIL/uL — ABNORMAL LOW (ref 3.87–5.11)
RDW: 17.4 % — ABNORMAL HIGH (ref 11.5–15.5)
WBC: 10.7 10*3/uL — AB (ref 4.0–10.5)

## 2017-09-26 LAB — GLUCOSE, CAPILLARY
GLUCOSE-CAPILLARY: 139 mg/dL — AB (ref 70–99)
GLUCOSE-CAPILLARY: 183 mg/dL — AB (ref 70–99)
Glucose-Capillary: 184 mg/dL — ABNORMAL HIGH (ref 70–99)

## 2017-09-26 LAB — MRSA PCR SCREENING: MRSA BY PCR: NEGATIVE

## 2017-09-26 LAB — C-REACTIVE PROTEIN: CRP: 24.6 mg/dL — ABNORMAL HIGH (ref <1.0)

## 2017-09-26 LAB — URIC ACID: URIC ACID, SERUM: 7.2 mg/dL — AB (ref 2.5–7.1)

## 2017-09-26 MED ORDER — MORPHINE SULFATE (PF) 2 MG/ML IV SOLN: 1.0000 mg | INTRAVENOUS | Status: DC | PRN

## 2017-09-26 MED ORDER — INSULIN ASPART 100 UNIT/ML ~~LOC~~ SOLN
0.0000 [IU] | Freq: Three times a day (TID) | SUBCUTANEOUS | Status: DC
  Administered 2017-09-26 (×2): 2 [IU] via SUBCUTANEOUS
  Administered 2017-09-26: 1 [IU] via SUBCUTANEOUS
  Administered 2017-09-27: 2 [IU] via SUBCUTANEOUS

## 2017-09-26 MED ORDER — GABAPENTIN 300 MG PO CAPS
300.0000 mg | ORAL_CAPSULE | Freq: Every day | ORAL | Status: DC
  Administered 2017-09-26: 300 mg via ORAL
  Filled 2017-09-26: qty 1

## 2017-09-26 MED ORDER — POLYETHYLENE GLYCOL 3350 17 G PO PACK
17.0000 g | PACK | Freq: Every day | ORAL | Status: DC
  Filled 2017-09-26: qty 1

## 2017-09-26 MED ORDER — OXYCODONE-ACETAMINOPHEN 5-325 MG PO TABS
1.0000 | ORAL_TABLET | ORAL | Status: DC | PRN
  Administered 2017-09-26 – 2017-09-27 (×3): 1 via ORAL
  Filled 2017-09-26 (×3): qty 1

## 2017-09-26 MED ORDER — INSULIN ASPART PROT & ASPART (70-30 MIX) 100 UNIT/ML ~~LOC~~ SUSP
20.0000 [IU] | Freq: Two times a day (BID) | SUBCUTANEOUS | Status: DC
  Administered 2017-09-26 – 2017-09-27 (×3): 20 [IU] via SUBCUTANEOUS
  Filled 2017-09-26: qty 10

## 2017-09-26 MED ORDER — CALCITRIOL 0.5 MCG PO CAPS
1.5000 ug | ORAL_CAPSULE | ORAL | Status: DC
  Administered 2017-09-27: 1.5 ug via ORAL
  Filled 2017-09-26: qty 3

## 2017-09-26 MED ORDER — HYDROXYZINE HCL 25 MG PO TABS
25.0000 mg | ORAL_TABLET | Freq: Every day | ORAL | Status: DC
  Administered 2017-09-26: 25 mg via ORAL
  Filled 2017-09-26: qty 1

## 2017-09-26 MED ORDER — NICOTINE 21 MG/24HR TD PT24
21.0000 mg | MEDICATED_PATCH | Freq: Every day | TRANSDERMAL | Status: DC
  Administered 2017-09-26: 21 mg via TRANSDERMAL
  Filled 2017-09-26 (×2): qty 1

## 2017-09-26 MED ORDER — SENNOSIDES-DOCUSATE SODIUM 8.6-50 MG PO TABS: 1.0000 | ORAL_TABLET | Freq: Every evening | ORAL | Status: DC | PRN

## 2017-09-26 MED ORDER — ACETAMINOPHEN 325 MG PO TABS: 650.0000 mg | ORAL_TABLET | Freq: Four times a day (QID) | ORAL | Status: DC | PRN

## 2017-09-26 MED ORDER — METOPROLOL SUCCINATE ER 25 MG PO TB24
25.0000 mg | ORAL_TABLET | Freq: Every day | ORAL | Status: DC
  Administered 2017-09-26: 25 mg via ORAL
  Filled 2017-09-26: qty 1

## 2017-09-26 MED ORDER — LIDOCAINE HCL (PF) 2 % IJ SOLN
0.0000 mL | Freq: Once | INTRAMUSCULAR | Status: DC | PRN
  Filled 2017-09-26 (×4): qty 20

## 2017-09-26 MED ORDER — LISINOPRIL 40 MG PO TABS
40.0000 mg | ORAL_TABLET | Freq: Every day | ORAL | Status: DC
  Administered 2017-09-26: 40 mg via ORAL
  Filled 2017-09-26: qty 1

## 2017-09-26 MED ORDER — ACETAMINOPHEN 650 MG RE SUPP: 650.0000 mg | Freq: Four times a day (QID) | RECTAL | Status: DC | PRN

## 2017-09-26 MED ORDER — GABAPENTIN 300 MG PO CAPS: 300.0000 mg | ORAL_CAPSULE | Freq: Every day | ORAL | Status: DC

## 2017-09-26 MED ORDER — ONDANSETRON HCL 4 MG/2ML IJ SOLN: 4.0000 mg | Freq: Four times a day (QID) | INTRAMUSCULAR | Status: DC | PRN

## 2017-09-26 MED ORDER — HEPARIN SODIUM (PORCINE) 5000 UNIT/ML IJ SOLN
5000.0000 [IU] | Freq: Three times a day (TID) | INTRAMUSCULAR | Status: DC
  Administered 2017-09-26: 5000 [IU] via SUBCUTANEOUS
  Filled 2017-09-26 (×2): qty 1

## 2017-09-26 MED ORDER — DARBEPOETIN ALFA 150 MCG/0.3ML IJ SOSY: 150.0000 ug | PREFILLED_SYRINGE | INTRAMUSCULAR | Status: DC

## 2017-09-26 MED ORDER — CINACALCET HCL 30 MG PO TABS
90.0000 mg | ORAL_TABLET | Freq: Every day | ORAL | Status: DC
  Administered 2017-09-26 – 2017-09-27 (×2): 90 mg via ORAL
  Filled 2017-09-26 (×2): qty 3

## 2017-09-26 MED ORDER — SEVELAMER CARBONATE 800 MG PO TABS: 2400.0000 mg | ORAL_TABLET | Freq: Two times a day (BID) | ORAL | Status: DC | PRN

## 2017-09-26 MED ORDER — AMLODIPINE BESYLATE 5 MG PO TABS
5.0000 mg | ORAL_TABLET | Freq: Two times a day (BID) | ORAL | Status: DC
  Administered 2017-09-26 (×2): 5 mg via ORAL
  Filled 2017-09-26 (×2): qty 1

## 2017-09-26 MED ORDER — NAPROXEN 375 MG PO TABS
375.0000 mg | ORAL_TABLET | Freq: Two times a day (BID) | ORAL | Status: DC
  Administered 2017-09-26 – 2017-09-27 (×2): 375 mg via ORAL
  Filled 2017-09-26 (×3): qty 1

## 2017-09-26 MED ORDER — DIPHENOXYLATE-ATROPINE 2.5-0.025 MG PO TABS: 1.0000 | ORAL_TABLET | Freq: Four times a day (QID) | ORAL | Status: DC | PRN

## 2017-09-26 MED ORDER — PANTOPRAZOLE SODIUM 40 MG PO TBEC
40.0000 mg | DELAYED_RELEASE_TABLET | Freq: Every day | ORAL | Status: DC
  Administered 2017-09-26 – 2017-09-27 (×2): 40 mg via ORAL
  Filled 2017-09-26 (×2): qty 1

## 2017-09-26 MED ORDER — ONDANSETRON HCL 4 MG PO TABS: 4.0000 mg | ORAL_TABLET | Freq: Four times a day (QID) | ORAL | Status: DC | PRN

## 2017-09-26 MED ORDER — METHOCARBAMOL 500 MG PO TABS: 500.0000 mg | ORAL_TABLET | Freq: Three times a day (TID) | ORAL | Status: DC | PRN

## 2017-09-26 MED ORDER — PRAMIPEXOLE DIHYDROCHLORIDE 0.25 MG PO TABS
0.5000 mg | ORAL_TABLET | Freq: Every day | ORAL | Status: DC
  Administered 2017-09-26 – 2017-09-27 (×2): 0.5 mg via ORAL
  Filled 2017-09-26 (×2): qty 2

## 2017-09-26 MED ORDER — MORPHINE SULFATE (PF) 2 MG/ML IV SOLN: 2.0000 mg | INTRAVENOUS | Status: DC | PRN

## 2017-09-26 MED ORDER — SEVELAMER CARBONATE 800 MG PO TABS
4000.0000 mg | ORAL_TABLET | Freq: Three times a day (TID) | ORAL | Status: DC
  Administered 2017-09-26 – 2017-09-27 (×4): 4000 mg via ORAL
  Filled 2017-09-26 (×4): qty 5

## 2017-09-26 MED ORDER — FENTANYL CITRATE (PF) 100 MCG/2ML IJ SOLN
50.0000 ug | Freq: Once | INTRAMUSCULAR | Status: AC
Start: 2017-09-26 — End: 2017-09-26
  Administered 2017-09-26: 50 ug via INTRAVENOUS
  Filled 2017-09-26: qty 2

## 2017-09-26 MED ORDER — ATORVASTATIN CALCIUM 80 MG PO TABS
80.0000 mg | ORAL_TABLET | Freq: Every day | ORAL | Status: DC
  Administered 2017-09-26 – 2017-09-27 (×2): 80 mg via ORAL
  Filled 2017-09-26 (×2): qty 1

## 2017-09-26 NOTE — Evaluation (Signed)
Physical Therapy Evaluation Patient Details Name: [name] MRN: [id number] DOB: [birth year] Today's Date: 09/26/2017   History of Present Illness  [name] is a 52 y.o. female with medical history significant of hypertension, hyperlipidemia, ESRD-HD (TTS), PVD, pericardial effusion, anemia, tobacco abuse, chronic back pain, who presents with neck pain, left hip pain. X-rays of hip were negative for fracture but did show degenerative changes. Please refer to MRI of C-spine for findings.  Clinical Impression   Pt admitted with above diagnosis. Pt currently with functional limitations due to the deficits listed below (see PT Problem List). Independent prior to admission, uses cane prn; Presents with decr functional mobility, and decr activity tolerance;  Pt will benefit from skilled PT to increase their independence and safety with mobility to allow discharge to the venue listed below.       Follow Up Recommendations Home health PT(followed by Outpt PT/OT for neck pain, back pain, and bil UE neuropathy)    Equipment Recommendations  Other (comment)(rollator RW)    Recommendations for Other Services       Precautions / Restrictions Precautions Precautions: Fall Restrictions Weight Bearing Restrictions: No      Mobility  Bed Mobility Overal bed mobility: (Mod I anticipated as pt was sitting up at EOB upon OT arrival)                Transfers Overall transfer level: Needs assistance Equipment used: Rolling walker (2 wheeled);Straight cane Transfers: Sit to/from Stand Sit to Stand: Min guard Stand pivot transfers: Min guard       General transfer comment: Pt was with increased independence using RW vs SPC,  pt should benefit from Rollator for safety and increased independence overall during functional mobility.  Ambulation/Gait Ambulation/Gait assistance: Min guard;+2 safety/equipment Gait Distance (Feet): 50 Feet Assistive device: Rolling walker (2  wheeled) Gait Pattern/deviations: Step-through pattern;Decreased step length - right;Decreased step length - left Gait velocity: slow   General Gait Details: Heavy dependence on UE support on RW; back and neck pain, which increased with more time walking and in upright activity, limited amb distance today  Stairs            Wheelchair Mobility    Modified Rankin (Stroke Patients Only)       Balance Overall balance assessment: Needs assistance Sitting-balance support: No upper extremity supported Sitting balance-Leahy Scale: Fair Sitting balance - Comments: Pt leaning to alleviate pain (secondary to L hip and neck pain) Postural control: Left lateral lean Standing balance support: Single extremity supported;Bilateral upper extremity supported Standing balance-Leahy Scale: Fair Standing balance comment: Pt with increased independence in standing using RW vs SPC noted. Please refer to PT notes for details.                             Pertinent Vitals/Pain Pain Assessment: 0-10 Pain Score: 7  Pain Location: 7/10 hip/neck pain during functional mobility anf ambulation. Pt states that it decreases when sitting Pain Descriptors / Indicators: Sharp;Grimacing;Discomfort;Aching Pain Intervention(s): Monitored during session    Home Living Family/patient expects to be discharged to:: Private residence Living Arrangements: Children;Spouse/significant other Available Help at Discharge: Family;Available PRN/intermittently Type of Home: Apartment Home Access: Level entry     Home Layout: One level Home Equipment: Walker - standard;Tub bench;Cane - single point Additional Comments: Pt reports that she doesn not always use SPC at home. Occasionally uses manual w/c (does not have her own) at MD appointments or for longer  distances.    Prior Function Level of Independence: Independent with assistive device(s)         Comments: SPC or manual w/c PRN, Bathes and dresses  herself except son does assist with tying shoes as pt has h/o both feet and hand neuropathy. Uses tub bench for Mod I tub transfers.      Hand Dominance   Dominant Hand: Right    Extremity/Trunk Assessment   Upper Extremity Assessment Upper Extremity Assessment: Defer to OT evaluation    Lower Extremity Assessment Lower Extremity Assessment: RLE deficits/detail RLE Deficits / Details: Noted RLE grossly weaker than LLE; R hip pain with weight bearing and MMT, pt attributes to arthritis    Cervical / Trunk Assessment Cervical / Trunk Assessment: Other exceptions Cervical / Trunk Exceptions: Limited C-spine rotation and extension, limited by pain  Communication   Communication: No difficulties  Cognition Arousal/Alertness: Awake/alert Behavior During Therapy: WFL for tasks assessed/performed Overall Cognitive Status: Within Functional Limits for tasks assessed                                        General Comments General comments (skin integrity, edema, etc.): Applied heat to posterior neck at end of session    Exercises     Assessment/Plan    PT Assessment Patient needs continued PT services  PT Problem List Decreased strength;Decreased range of motion;Decreased activity tolerance;Decreased balance;Decreased mobility;Decreased coordination;Decreased knowledge of use of DME;Decreased knowledge of precautions;Pain       PT Treatment Interventions DME instruction;Gait training;Stair training;Functional mobility training;Therapeutic activities;Therapeutic exercise;Balance training;Patient/family education    PT Goals (Current goals can be found in the Care Plan section)  Acute Rehab PT Goals Patient Stated Goal: Be able to care for herself, decreased pain PT Goal Formulation: With patient Time For Goal Achievement: 10/10/17 Potential to Achieve Goals: Good    Frequency Min 3X/week   Barriers to discharge        Co-evaluation PT/OT/SLP  Co-Evaluation/Treatment: Yes Reason for Co-Treatment: For patient/therapist safety;To address functional/ADL transfers(documented that she was unable to walk in ED) PT goals addressed during session: Mobility/safety with mobility OT goals addressed during session: ADL's and self-care;Proper use of Adaptive equipment and DME       AM-PAC PT "6 Clicks" Daily Activity  Outcome Measure Difficulty turning over in bed (including adjusting bedclothes, sheets and blankets)?: A Little Difficulty moving from lying on back to sitting on the side of the bed? : A Little Difficulty sitting down on and standing up from a chair with arms (e.g., wheelchair, bedside commode, etc,.)?: A Little Help needed moving to and from a bed to chair (including a wheelchair)?: A Little Help needed walking in hospital room?: A Little Help needed climbing 3-5 steps with a railing? : A Lot 6 Click Score: 17    End of Session Equipment Utilized During Treatment: Gait belt Activity Tolerance: Patient limited by pain Patient left: in chair;with call bell/phone within reach Nurse Communication: Mobility status PT Visit Diagnosis: Other abnormalities of gait and mobility (R26.89);Pain Pain - part of body: (Neck and Back)    Time: 1740-8144 PT Time Calculation (min) (ACUTE ONLY): 28 min   Charges:   PT Evaluation $PT Eval Moderate Complexity: 1 Mod          Roney Marion, [place]  Acute Rehabilitation Services Pager [phone] Office [phone]   Colletta [place] 09/26/2017, 9:56 AM

## 2017-09-26 NOTE — Evaluation (Signed)
Occupational Therapy Evaluation Patient Details Name: [name] MRN: [id number] DOB: [birth year] Today's Date: 09/26/2017    History of Present Illness [name] is a 52 y.o. female with medical history significant of hypertension, hyperlipidemia, ESRD-HD (TTS), PVD, pericardial effusion, anemia, tobacco abuse, chronic back pain, who presents with neck pain, left hip pain. X-rays of hip were negative for fracture but did show degenerative changes. Please refer to MRI of C-spine for findings.   Clinical Impression   Pt admitted as per above with left hip and right cervical neck pain impacting her ability to perform ADL and functional transfers related to daily activities whom should benefit from acute OT to further assist with increasing independence w/ ADL's & provide pt ed for DME and A/E needs as well as tub transfers. Pt was Mod I except for tying shoes prior to this admission and should benefit from elastic shoelaces. She currently requires Min A for ADL's. Recommend HHOT.     Follow Up Recommendations  Home health OT;Supervision - Intermittent    Equipment Recommendations  Other (comment)(Elastic shoelaces; Cont to assess for further needs)    Recommendations for Other Services       Precautions / Restrictions Precautions Precautions: Fall Restrictions Weight Bearing Restrictions: No      Mobility Bed Mobility Overal bed mobility: (Mod I anticipated as pt was sitting up at EOB upon OT arrival)                Transfers Overall transfer level: Needs assistance Equipment used: Rolling walker (2 wheeled);Straight cane Transfers: Sit to/from [facility] Sit to Stand: Min guard Stand pivot transfers: Min guard       General transfer comment: Pt was with increased independence using RW vs SPC, per discussion with PT, pt should benefit from Rollator for safety and increased independence overall during functional mobility.    Balance Overall  balance assessment: Needs assistance Sitting-balance support: No upper extremity supported Sitting balance-Leahy Scale: Fair Sitting balance - Comments: Pt leaning to alleviate pain (secondary to L hip and neck pain) Postural control: Left lateral lean Standing balance support: Single extremity supported;Bilateral upper extremity supported Standing balance-Leahy Scale: Fair Standing balance comment: Pt with increased independence in standing using RW vs SPC noted. Please refer to PT notes for details.                           ADL either performed or assessed with clinical judgement   ADL Overall ADL's : Needs assistance/impaired Eating/Feeding: Set up;Sitting   Grooming: Wash/dry face;Wash/dry hands;Oral care;Standing;Min guard Grooming Details (indicate cue type and reason): Standing at sink for grooming tasks this morning. Pt noted to hold onto counter top at times while shifting weight due to Left hip pain and right neck pain Upper Body Bathing: Set up;Sitting   Lower Body Bathing: Min guard;Sit to/from stand;Sitting/lateral leans   Upper Body Dressing : Modified independent;Sitting   Lower Body Dressing: Minimal assistance;Sit to/from stand Lower Body Dressing Details (indicate cue type and reason): Min Assist to don shoes. Pt son ties her shoes for her secondary to bilateral hand and foot neuropathy. Toilet Transfer: Engineer, maintenance (IT) Details (indicate cue type and reason): 3:1 over toilet Toileting- Clothing Manipulation and Hygiene: Modified independent;Sitting/lateral lean     Tub/Shower Transfer Details (indicate cue type and reason): To be assessed, pt has tub bench at home and reports Mod I transfers and bathing Functional mobility during ADLs: Min guard;Rolling walker  General ADL Comments: Pt reports overall Mod I ADL's and daily tasks except for assist from her son to tie her shoes in the morning when she's feeling well. She is  currently limited by L hip and right sided neck pain which appears to be impacting her ability to perform ADL's and functional transfers. She should benefit from elastic shoelaces to increase independence with ADL's. Will follow up with tub transfers and current recommendation of HHOT.     Vision Baseline Vision/History: Wears glasses Wears Glasses: Reading only Patient Visual Report: No change from baseline Vision Assessment?: No apparent visual deficits     Perception     Praxis      Pertinent Vitals/Pain Pain Assessment: 0-10 Pain Score: 7  Pain Location: 7/10 hip/neck pain during functional mobility anf ambulation. Pt states that it decreases when sitting Pain Descriptors / Indicators: Sharp;Grimacing;Discomfort;Aching Pain Intervention(s): Limited activity within patient's tolerance;Repositioned;Heat applied;Monitored during session;Premedicated before session     Hand Dominance Right   Extremity/Trunk Assessment Upper Extremity Assessment Upper Extremity Assessment: Overall WFL for tasks assessed;Generalized weakness(Noted bilateral hand neuropathy making FM tasks difficult however pt was Mod I opening toothpaste amd brushing teeth, cannot tie shoes.)   Lower Extremity Assessment Lower Extremity Assessment: Defer to PT evaluation       Communication Communication Communication: No difficulties   Cognition Arousal/Alertness: Awake/alert Behavior During Therapy: WFL for tasks assessed/performed Overall Cognitive Status: Within Functional Limits for tasks assessed                                     General Comments       Exercises     Shoulder Instructions      Home Living Family/patient expects to be discharged to:: Private residence Living Arrangements: Children;Spouse/significant other Available Help at Discharge: Family;Available PRN/intermittently Type of Home: Apartment Home Access: Level entry     Home Layout: One level     Bathroom  Shower/Tub: Tub/shower unit         Home Equipment: Walker - standard;Tub bench;Cane - single point   Additional Comments: Pt reports that she doesn not always use SPC at home. Occasionally uses manual w/c (does not have her own) at MD appointments or for longer distances.      Prior Functioning/Environment Level of Independence: Independent with assistive device(s)        Comments: SPC or manual w/c PRN, Bathes and dresses herself except son does assist with tying shoes as pt has h/o both feet and hand neuropathy. Uses tub bench for Mod I tub transfers.         OT Problem List: Decreased activity tolerance;Decreased knowledge of use of DME or AE;Impaired UE functional use;Pain;Decreased strength      OT Treatment/Interventions: Self-care/ADL training;DME and/or AE instruction;Therapeutic activities;Patient/family education    OT Goals(Current goals can be found in the care plan section) Acute Rehab OT Goals Patient Stated Goal: Be able to care for herself, decreased pain OT Goal Formulation: With patient Time For Goal Achievement: 10/10/17 Potential to Achieve Goals: Good  OT Frequency: Min 3X/week   Barriers to D/C:            Co-evaluation PT/OT/SLP Co-Evaluation/Treatment: Yes Reason for Co-Treatment: To address functional/ADL transfers;For patient/therapist safety;Other (comment)(Pt unable to ambulate in ER secondary to pain in neck and left hip) PT goals addressed during session: Mobility/safety with mobility;Balance;Proper use of DME OT goals addressed during session: ADL's  and self-care;Proper use of Adaptive equipment and DME      AM-PAC PT "6 Clicks" Daily Activity     Outcome Measure Help from another person eating meals?: None Help from another person taking care of personal grooming?: A Little Help from another person toileting, which includes using toliet, bedpan, or urinal?: A Little Help from another person bathing (including washing, rinsing,  drying)?: A Little Help from another person to put on and taking off regular upper body clothing?: None Help from another person to put on and taking off regular lower body clothing?: A Little 6 Click Score: 20   End of Session Equipment Utilized During Treatment: Gait belt;Rolling walker;Other (comment)(SPC initially)  Activity Tolerance: Patient limited by pain Patient left: in chair;with call bell/phone within reach;Other (comment)(W/ MD in room at conclusion of therapy session)  OT Visit Diagnosis: Unsteadiness on feet (R26.81);Muscle weakness (generalized) (M62.81);Pain Pain - Right/Left: (Left hip, Right/cervical neck area) Pain - part of body: Hip(Cervical Neck)                Time: 0981-1914 OT Time Calculation (min): 27 min Charges:  OT General Charges $OT Visit: 1 Visit OT Evaluation $OT Eval Moderate Complexity: 1 Mod   Kelsey Durflinger Beth Dixon, OTR/L 09/26/2017, 9:22 AM

## 2017-09-26 NOTE — ED Notes (Signed)
Patient returned from MRI.

## 2017-09-26 NOTE — Progress Notes (Addendum)
PROGRESS NOTE    [name]  OJJ:[id number] DOB: May 09, 1965 DOA: 09/25/2017 PCP: Tereasa Coop, PA-C  Brief Narrative: [name] is a 52 y.o. female with medical history significant of hypertension, hyperlipidemia, ESRD-HD (TTS), PVD, pericardial effusion, anemia, tobacco abuse, chronic back pain, who presented to the ER with neck pain and left hip pain x2days -patient reports that she developed diarrhea over 3 weeks ago, along with a new painful left upper thigh rash, she took some Flagyl but she had from prior C. Difficile infection, this got better, then 2 weeks ago she developed severe pain swelling and limited mobility in her left shoulder which lasted for about a week and then resolved, following this last week her right shoulder became painful tender with limited mobility which also lasted approximately 1 week, after this 2 days ago she started having severe neck pain and left hip pain, presented to the emergency room overnight and was admitted. -MRI C-spine: noted cervical spondylosis and C5-C6 disc protrusion and anterior impingement of cervical cord -MRI left hip degenerative disease -ESR significantly elevated, CRP high as well   Assessment & Plan:     Severe migratory polyarthritis -Originally involving the left shoulder 3weeks ago followed by right shoulder and now neck and left hip -Etiology not clear at this time,  -follow-up blood cultures -ESR markedly elevated at 138,CRP is significantly high at 24.8 -check ANA, anti-CCP antibody, RMSF titers, not likely to be Lyme's given geography, also check Uric acid -add naproxen for 2 days with protonix -if workup remains negative, empiric prednisone with Rheum FU could be considered   Severe neck pain -Could be part of above spectrum however given, abnormal MRI C-spine with distal protrusion and impingement of the anterior cervical cord, called and discussed with neurosurgery Dr.Nudelman, he reviewed MRI and felt that  she had a lot of degenerative changes in her cervical spine however does not have an abnormal cord signal and neurological symptoms concerning for compression -Recommended continuing rheumatological workup  Left Thigh rash/dermatomal-Varicella Zoster most likely -started 3-4weeks ago, appears like Zoster-healed and hyperpigmented now, no open lesions, no vesicles  -d/w ID Dr.Campbell, no benefit with Antivirals since so late since onset of rash   Type 2 diabetes mellitus -Last hemoglobin A1c was 10.7 -Continue insulin 75/25  ESRD on hemodialysis -Stable, will consult Renal for inpatient dialysis  Anemia due to chronic kidney disease -stable, continue Epo  Recent diarrhea -Resolved -3 weeks ago C. Difficile antigen was positive with negative toxin suggesting colonization   Rest of medical problems as dictated by Dr.Niu this am  DVT prophylaxis:heparin subcutaneous Code Status: full code Family Communication:no family at bedside Disposition Plan: home pending above workup  Consultants:   Neurosurgery Renal General surgery   Procedures:   Antimicrobials:    Subjective: -complains of severe neck pain, and left hip pain  Objective: Vitals:   09/26/17 0145 09/26/17 0327 09/26/17 0520 09/26/17 0908  BP: (!) 119/94 (!) 116/91 (!) 136/98 109/67  Pulse:  88 98 81  Resp: (!) 21 (!) 22 (!) 22 20  Temp:  98.1 F (36.7 C) 98.2 F (36.8 C) 98.1 F (36.7 C)  TempSrc:  Oral Oral Oral  SpO2:  100% 100% (!) 49%    Intake/Output Summary (Last 24 hours) at 09/26/2017 1014 Last data filed at 09/26/2017 0900 Gross per 24 hour  Intake 600 ml  Output -  Net 600 ml   There were no vitals filed for this visit.  Examination:  General exam: Appears  calm and comfortable, chronically ill-appearing female Respiratory system: decreased breath sounds at both bases Cardiovascular system: S1 & S2 heard, RRR Gastrointestinal system: Abdomen is nondistended, soft and nontender.Normal  bowel sounds heard. Central nervous system: Alert and oriented. No focal neurological deficits. Extremities: no edema Skin: raised hyperpigmented skin rash on left upper thigh Psychiatry: Judgement and insight appear normal. Mood & affect appropriate.     Data Reviewed:   CBC: Recent Labs  Lab 09/25/17 1509 09/26/17 0243  WBC 11.9* 10.7*  NEUTROABS 9.1*  --   HGB 10.3* 9.7*  HCT 32.4* 31.0*  MCV 95.6 95.1  PLT 279 818   Basic Metabolic Panel: Recent Labs  Lab 09/25/17 1509 09/26/17 0243  NA 133* 131*  K 4.6 4.6  CL 89* 87*  CO2 24 24  GLUCOSE 236* 167*  BUN 47* 54*  CREATININE 7.08* 7.78*  CALCIUM 9.7 9.2   GFR: CrCl cannot be calculated (Unknown ideal weight.). Liver Function Tests: Recent Labs  Lab 09/25/17 1509  AST 22  ALT 18  ALKPHOS 72  BILITOT 0.5  PROT 8.4*  ALBUMIN 3.1*   No results for input(s): LIPASE, AMYLASE in the last 168 hours. No results for input(s): AMMONIA in the last 168 hours. Coagulation Profile: No results for input(s): INR, PROTIME in the last 168 hours. Cardiac Enzymes: No results for input(s): CKTOTAL, CKMB, CKMBINDEX, TROPONINI in the last 168 hours. BNP (last 3 results) No results for input(s): PROBNP in the last 8760 hours. HbA1C: No results for input(s): HGBA1C in the last 72 hours. CBG: Recent Labs  Lab 09/26/17 0732  GLUCAP 139*   Lipid Profile: No results for input(s): CHOL, HDL, LDLCALC, TRIG, CHOLHDL, LDLDIRECT in the last 72 hours. Thyroid Function Tests: No results for input(s): TSH, T4TOTAL, FREET4, T3FREE, THYROIDAB in the last 72 hours. Anemia Panel: No results for input(s): VITAMINB12, FOLATE, FERRITIN, TIBC, IRON, RETICCTPCT in the last 72 hours. Urine analysis:    Component Value Date/Time   COLORURINE YELLOW 07/22/2011 1526   APPEARANCEUR CLOUDY (A) 07/22/2011 1526   LABSPEC 1.022 07/22/2011 1526   PHURINE 5.5 07/22/2011 1526   GLUCOSEU 100 (A) 07/22/2011 1526   HGBUR MODERATE (A) 07/22/2011  1526   BILIRUBINUR NEGATIVE 07/22/2011 1526   BILIRUBINUR NEG 07/19/2011 1202   KETONESUR NEGATIVE 07/22/2011 1526   PROTEINUR >300 (A) 07/22/2011 1526   UROBILINOGEN 0.2 07/22/2011 1526   NITRITE NEGATIVE 07/22/2011 1526   LEUKOCYTESUR TRACE (A) 07/22/2011 1526   Sepsis Labs: @LABRCNTIP (procalcitonin:4,lacticidven:4)  )No results found for this or any previous visit (from the past 240 hour(s)).       Radiology Studies: Mr Cervical Spine Wo Contrast  Result Date: 09/26/2017 CLINICAL DATA:  52 y/o F; base of the neck pain radiating into the trapezius muscle. EXAM: MRI CERVICAL SPINE WITHOUT CONTRAST TECHNIQUE: Multiplanar, multisequence MR imaging of the cervical spine was performed. No intravenous contrast was administered. COMPARISON:  07/03/2011 MRI of the cervical spine. FINDINGS: Motion degradation on multiple sequences. Alignment: Physiologic. Vertebrae: No fracture, evidence of discitis, or bone lesion. Cord: No cord signal abnormality. Posterior Fossa, vertebral arteries, paraspinal tissues: Negative. Disc levels: C2-3: No significant disc displacement, foraminal stenosis, or canal stenosis. C3-4: New small right central disc protrusion with ventral thecal sac effacement. No significant foraminal or canal stenosis. C4-5: Stable disc osteophyte complex eccentric to the left with left-greater-than-right uncovertebral and facet hypertrophy. Mild left-greater-than-right foraminal stenosis and mild canal stenosis. C5-6: Progression of discogenic degenerative changes and central disc protrusion with cord impingement and  flattening as well as moderate spinal canal stenosis. Left-greater-than-right uncovertebral and facet hypertrophy contributes to mild right and moderate left foraminal stenosis. C6-7: Stable disc osteophyte complex with left-greater-than-right uncovertebral and facet hypertrophy. Mild left foraminal stenosis. No significant canal stenosis. C7-T1: No significant disc  displacement, foraminal stenosis, or canal stenosis. IMPRESSION: 1. Motion artifact on multiple sequences. 2. No acute osseous or cord signal abnormality identified. 3. Mild progression of cervical spondylosis predominantly at the C5-6 level where a central disc protrusion impinges the anterior cord with cord flattening. 4. Mild C4-5 and moderate C5-6 canal stenosis. 5. Moderate left C5-6 foraminal stenosis. Multilevel mild foraminal stenosis. Electronically Signed   By: Kristine Garbe M.D.   On: 09/26/2017 00:42   Mr Hip Left Wo Contrast  Result Date: 09/26/2017 CLINICAL DATA:  Left hip pain for 2 weeks.  No history of trauma. EXAM: MR OF THE LEFT HIP WITHOUT CONTRAST TECHNIQUE: Multiplanar, multisequence MR imaging was performed. No intravenous contrast was administered. COMPARISON:  Radiographs 09/25/2017 FINDINGS: Moderate bilateral hip joint degenerative changes for age with joint space narrowing, degenerative chondrosis, subchondral cystic change and small joint effusions. No stress fracture or AVN. No bone lesions. The surrounding hip and pelvic musculature appear unremarkable. No muscle tear, myositis or muscle mass. Mild bilateral peritendinitis but no findings for trochanteric bursitis. The pubic symphysis and SI joints are maintained. No findings for septic arthritis. The bony pelvis is intact. Mild bilateral hamstring tendinopathy. No significant intrauterine pelvic abnormalities other than an enlarged fibroid uterus. The left-sided fibroid has increased T1 signal intensity and could be a lipoid leiomyoma. It does not appear separate from the uterus to suggest an endometrioma. IMPRESSION: 1. Moderate bilateral hip joint degenerative changes but no stress fracture or AVN. 2. Intact bony pelvis. No findings for SI joint or pubic symphysis septic arthritis. 3. Uterine fibroids. Electronically Signed   By: Marijo Sanes M.D.   On: 09/26/2017 08:32   Dg Hip Unilat W Or Wo Pelvis 2-3 Views  Left  Result Date: 09/25/2017 CLINICAL DATA:  Leg pain. EXAM: DG HIP (WITH OR WITHOUT PELVIS) 2-3V LEFT COMPARISON:  None. FINDINGS: There is a large calcified fibroid in the uterus, better assessed on previous CT imaging. Vascular calcifications are noted. No acute fractures or dislocations. Mild degenerative changes in the hips. IMPRESSION: Mild degenerative changes in the hips. No other cause for leg pain identified. Large calcified fibroid in the uterus. Electronically Signed   By: Dorise Bullion III M.D   On: 09/25/2017 18:41        Scheduled Meds: . amLODipine  5 mg Oral BID  . atorvastatin  80 mg Oral Daily  . cinacalcet  90 mg Oral Q breakfast  . gabapentin  300 mg Oral Daily  . heparin  5,000 Units Subcutaneous Q8H  . hydrOXYzine  25 mg Oral Daily  . insulin aspart  0-9 Units Subcutaneous TID WC  . insulin aspart protamine- aspart  20 Units Subcutaneous BID WC  . lisinopril  40 mg Oral Daily  . metoprolol succinate  25 mg Oral Daily  . nicotine  21 mg Transdermal Daily  . pramipexole  0.5 mg Oral Daily  . sevelamer carbonate  4,000 mg Oral TID WC   Continuous Infusions: . sodium chloride       LOS: 0 days    Time spent: 94mn    PDomenic Polite MD [facility] Page via [email or web address], password [email or web address] After 7PM please contact night-coverage  09/26/2017, 10:14 AM

## 2017-09-26 NOTE — ED Notes (Signed)
Patient unable to ambulate due to severe pain in LEFT hip and neck.

## 2017-09-26 NOTE — ED Provider Notes (Signed)
Care assumed from previous provider Dr.Allen. Please see their note for further details to include full history and physical. To summarize in short pt is a 52-year-old female history of end-stage renal disease on hemodialysis, diabetes presents to the emergency department today for evaluation of neck pain and left hip pain.. Case discussed, plan agreed upon.   Time of care handoff was awaiting MRI of cervical spine.  Patient does have elevated ESR and was concerned for possible epidural abscess.  Patient's pain has been difficult to control the ED however she does fall asleep in the room when you reevaluate patient.  MRI of cervical spine returns that shows chronic findings but no acute findings and shows no concern for epidural abscess.  Patient was not able to ambulate due to significant pain.  Additional pain medication were given.  Unable to control patient's pain in the ED and consulted hospital medicine for admission for pain control.  Dr. Nui with hospital medicine who agrees to admission will see patient in the ED and patient mission orders.  Patient was updated on plan of care remains hemodynamic stable this time.       Leaphart, Kenneth T, PA-C 09/26/17 0135    Allen, Valenti, MD 09/27/17 1540  

## 2017-09-26 NOTE — H&P (Signed)
History and Physical    [name]:[id number] DOB: 1965-08-26 DOA: 09/25/2017  Referring MD/NP/PA:   PCP: Tereasa Coop, PA-C   Patient coming from:  The patient is coming from home.  At baseline, pt is independent for most of ADL.   Chief Complaint: neck pain and left hip pain  HPI: [name] is a 52 y.o. female with medical history significant of hypertension, hyperlipidemia, ESRD-HD (TTS), PVD, pericardial effusion, anemia, tobacco abuse, chronic back pain, who presents with neck pain, left hip pain.  Patient states that she has been having neck pain and left hip pain for more than 2 days. The neck pain is located in the posterior neck, constant, sharp, 9 out of 10 in severity, radiating into right arm. Patient states that several days ago, she had right arm pain, which has resolved currently. She does not have arm numbness or weakness. She also reports left hip pain, which is constant, sharp, 9 out of 10 in severity, nonradiating. She states that she cannot walk because of severe left hip pain. She states she has chronic back pain, which has also contributed to inability to walk. She states that her chronic back pain has no significant changes recently. Patient does not have any injury. No loss control of bladder or bowel movement. Patient denies chest pain, shortness of breath, cough. No fever or chills. No nausea, vomiting, diarrhea, abdominal pain, symptoms of UTI.  ED Course: pt was found to have WBC 11.9, ESR 138, potassium 4.0, bicarbonate is 24, creatinine 7.08, BUN 47, temperature normal, no tachycardia, oxygen satting 93% on room air. X-ray of left hip is negative for bony fracture, but showed degenerative change. Pending MRI-left hip. The patient is placed on MedSurg bed for observation.  MRI-C spin: 1. Motion artifact on multiple sequences. 2. No acute osseous or cord signal abnormality identified. 3. Mild progression of cervical spondylosis predominantly at the  C5-6 level where a central disc protrusion impinges the anterior cord with cord flattening. 4. Mild C4-5 and moderate C5-6 canal stenosis. 5. Moderate left C5-6 foraminal stenosis. Multilevel mild foraminal stenosis.  Review of Systems:   General: no fevers, chills, no body weight gain, has fatigue HEENT: no blurry vision, hearing changes or sore throat Respiratory: no dyspnea, coughing, wheezing CV: no chest pain, no palpitations GI: no nausea, vomiting, abdominal pain, diarrhea, constipation GU: no dysuria, burning on urination, increased urinary frequency, hematuria  Ext: has mild leg edema Neuro: no unilateral weakness, numbness, or tingling, no vision change or hearing loss Skin: no rash, no skin tear. MSK: has chronic low back pain, acute neck pain and left hip pain Heme: No easy bruising.  Travel history: No recent long distant travel.  Allergy: No Known Allergies  Past Medical History:  Diagnosis Date  . Anemia   . Arthritis    "knees"  . ESRD (end stage renal disease) ([place])   . Headache(784.0)   . High cholesterol   . Hypertension   . 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 Pericardial effusion   . PVD (peripheral vascular disease) ([place])    Right leg stent in [place].  (No records)  . Type II diabetes mellitus ([place])     Past Surgical History:  Procedure Laterality Date  . [place]; 1997  . PERICARDIAL WINDOW  02/2004   for pericardial effusion  . TUBAL LIGATION  05/1995    Social History:  reports that she has  been smoking cigarettes. She has a 20.00 pack-year smoking history. She has never used smokeless tobacco. She reports that she does not drink alcohol or use drugs.  Family History:  Family History  Problem Relation Age of Onset  . Cancer Brother   . Heart disease Father        Died age 71  . Diabetes Father   . Hyperlipidemia Father   . Hypertension Father   . Stroke Father   . Diabetes Mother   .  Hypertension Mother   . Stroke Mother   . Dementia Mother   . Diabetes Sister   . Diabetes Brother   . Hypertension Sister   . Hypertension Brother      Prior to Admission medications   Medication Sig Start Date End Date Taking? Authorizing Provider  amLODipine (NORVASC) 5 MG tablet Take 5 mg by mouth 2 (two) times daily.  04/28/17  Yes [provider]  atorvastatin (LIPITOR) 80 MG tablet Take 1 tablet (80 mg total) by mouth daily. Take 1/2 tab daily for one week, then increase to the full tab. Patient taking differently: Take 80 mg by mouth daily.  06/01/17  Yes Tereasa Coop, PA-C  cinacalcet (SENSIPAR) 90 MG tablet Take 90 mg by mouth daily. 05/23/17  Yes [provider]  diphenoxylate-atropine (LOMOTIL) 2.5-0.025 MG tablet Take 1 tablet by mouth 4 (four) times daily as needed for diarrhea or loose stools. 09/06/17  Yes Kirichenko, Tatyana, PA-C  gabapentin (NEURONTIN) 100 MG capsule Take 300 mg by mouth daily.  04/28/17  Yes [provider]  hydrOXYzine (ATARAX/VISTARIL) 25 MG tablet Take 25 mg by mouth daily.  05/12/17  Yes [provider]  Insulin Lispro Prot & Lispro (HUMALOG MIX 75/25 KWIKPEN) (75-25) 100 UNIT/ML Kwikpen Inject 25 Units into the skin 2 (two) times daily with a meal.   Yes [provider]  lisinopril (PRINIVIL,ZESTRIL) 40 MG tablet Take 40 mg by mouth daily.  03/17/17  Yes [provider]  metoprolol succinate (TOPROL-XL) 25 MG 24 hr tablet Take 1 tablet (25 mg total) by mouth daily. 06/01/17  Yes Tereasa Coop, PA-C  oxyCODONE (ROXICODONE) 15 MG immediate release tablet Take 15 mg by mouth 3 (three) times daily as needed for pain.  08/24/17  Yes [provider]  pramipexole (MIRAPEX) 0.5 MG tablet Take 0.5 mg by mouth daily.  05/18/17  Yes [provider]  sevelamer carbonate (RENVELA) 800 MG tablet Take 2,400-4,000 mg by mouth See admin instructions. Take 5 capsules (4000 mg) by mouth up to three times  daily with meals and 3 capsules (2400 mg) with snacks   Yes [provider]    Physical Exam: Vitals:   09/25/17 1936 09/25/17 2130 09/26/17 0015 09/26/17 0145  BP: 113/71  135/77 (!) 119/94  Pulse:      Resp: (!) 25 16  (!) 21  Temp:      TempSrc:      SpO2: 94%  95%    General: Not in acute distress HEENT:       Eyes: PERRL, EOMI, no scleral icterus.       ENT: No discharge from the ears and nose, no pharynx injection, no tonsillar enlargement.        Neck: No JVD, no bruit, no mass felt. Heme: No neck lymph node enlargement. Cardiac: S1/S2, RRR, No murmurs, No gallops or rubs. Respiratory: No rales, wheezing, rhonchi or rubs. GI: Soft, nondistended, nontender, no rebound pain, no organomegaly, BS present.  GU: No hematuria Ext: has trace leg edema bilaterally. 2+DP/PT pulse bilaterally. Musculoskeletal: has tenderness in posterior neck, left hip and lower back. Skin: No rashes.  Neuro: Alert, oriented X3, cranial nerves II-XII grossly intact, moves all extremities normally.  Psych: Patient is not psychotic, no suicidal or hemocidal ideation.  Labs on Admission: I have personally reviewed following labs and imaging studies  CBC: Recent Labs  Lab 09/25/17 1509  WBC 11.9*  NEUTROABS 9.1*  HGB 10.3*  HCT 32.4*  MCV 95.6  PLT 559   Basic Metabolic Panel: Recent Labs  Lab 09/25/17 1509  NA 133*  K 4.6  CL 89*  CO2 24  GLUCOSE 236*  BUN 47*  CREATININE 7.08*  CALCIUM 9.7   GFR: CrCl cannot be calculated (Unknown ideal weight.). Liver Function Tests: Recent Labs  Lab 09/25/17 1509  AST 22  ALT 18  ALKPHOS 72  BILITOT 0.5  PROT 8.4*  ALBUMIN 3.1*   No results for input(s): LIPASE, AMYLASE in the last 168 hours. No results for input(s): AMMONIA in the last 168 hours. Coagulation Profile: No results for input(s): INR, PROTIME in the last 168 hours. Cardiac Enzymes: No results for input(s): CKTOTAL, CKMB, CKMBINDEX, TROPONINI in the last 168  hours. BNP (last 3 results) No results for input(s): PROBNP in the last 8760 hours. HbA1C: No results for input(s): HGBA1C in the last 72 hours. CBG: No results for input(s): GLUCAP in the last 168 hours. Lipid Profile: No results for input(s): CHOL, HDL, LDLCALC, TRIG, CHOLHDL, LDLDIRECT in the last 72 hours. Thyroid Function Tests: No results for input(s): TSH, T4TOTAL, FREET4, T3FREE, THYROIDAB in the last 72 hours. Anemia Panel: No results for input(s): VITAMINB12, FOLATE, FERRITIN, TIBC, IRON, RETICCTPCT in the last 72 hours. Urine analysis:    Component Value Date/Time   COLORURINE YELLOW 07/22/2011 1526   APPEARANCEUR CLOUDY (A) 07/22/2011 1526   LABSPEC 1.022 07/22/2011 1526   PHURINE 5.5 07/22/2011 1526   GLUCOSEU 100 (A) 07/22/2011 1526   HGBUR MODERATE (A) 07/22/2011 1526   BILIRUBINUR NEGATIVE 07/22/2011 1526   BILIRUBINUR NEG 07/19/2011 1202   KETONESUR NEGATIVE 07/22/2011 1526   PROTEINUR >300 (A) 07/22/2011 1526   UROBILINOGEN 0.2 07/22/2011 1526   NITRITE NEGATIVE 07/22/2011 1526   LEUKOCYTESUR TRACE (A) 07/22/2011 1526   Sepsis Labs: @LABRCNTIP (procalcitonin:4,lacticidven:4) )No results found for this or any previous visit (from the past 240 hour(s)).   Radiological Exams on Admission: Mr Cervical Spine Wo Contrast  Result Date: 09/26/2017 CLINICAL DATA:  52 y/o F; base of the neck pain radiating into the trapezius muscle. EXAM: MRI CERVICAL SPINE WITHOUT CONTRAST TECHNIQUE: Multiplanar, multisequence MR imaging of the cervical spine was performed. No intravenous contrast was administered. COMPARISON:  07/03/2011 MRI of the cervical spine. FINDINGS: Motion degradation on multiple sequences. Alignment: Physiologic. Vertebrae: No fracture, evidence of discitis, or bone lesion. Cord: No cord signal abnormality. Posterior Fossa, vertebral arteries, paraspinal tissues: Negative. Disc levels: C2-3: No significant disc displacement, foraminal stenosis, or canal  stenosis. C3-4: New small right central disc protrusion with ventral thecal sac effacement. No significant foraminal or canal stenosis. C4-5: Stable disc osteophyte complex eccentric to the left with left-greater-than-right uncovertebral and facet hypertrophy. Mild left-greater-than-right foraminal stenosis and mild canal stenosis. C5-6: Progression of discogenic degenerative changes and central disc protrusion with cord impingement and flattening as well as moderate spinal canal stenosis. Left-greater-than-right uncovertebral and facet hypertrophy contributes to mild right and moderate left foraminal stenosis. C6-7: Stable disc osteophyte complex with left-greater-than-right uncovertebral and  facet hypertrophy. Mild left foraminal stenosis. No significant canal stenosis. C7-T1: No significant disc displacement, foraminal stenosis, or canal stenosis. IMPRESSION: 1. Motion artifact on multiple sequences. 2. No acute osseous or cord signal abnormality identified. 3. Mild progression of cervical spondylosis predominantly at the C5-6 level where a central disc protrusion impinges the anterior cord with cord flattening. 4. Mild C4-5 and moderate C5-6 canal stenosis. 5. Moderate left C5-6 foraminal stenosis. Multilevel mild foraminal stenosis. Electronically Signed   By: Kristine Garbe M.D.   On: 09/26/2017 00:42   Dg Hip Unilat W Or Wo Pelvis 2-3 Views Left  Result Date: 09/25/2017 CLINICAL DATA:  Leg pain. EXAM: DG HIP (WITH OR WITHOUT PELVIS) 2-3V LEFT COMPARISON:  None. FINDINGS: There is a large calcified fibroid in the uterus, better assessed on previous CT imaging. Vascular calcifications are noted. No acute fractures or dislocations. Mild degenerative changes in the hips. IMPRESSION: Mild degenerative changes in the hips. No other cause for leg pain identified. Large calcified fibroid in the uterus. Electronically Signed   By: Dorise Bullion III M.D   On: 09/25/2017 18:41     EKG: Not done in  ED.   Assessment/Plan Principal Problem:   Neck pain Active Problems:   Hypertension   Type II diabetes mellitus with complication, uncontrolled (HCC)   Hyperlipidemia, mixed   ESRD on dialysis ([place])   Anemia   Left hip pain   Leukocytosis   Neck pain: MRI of C spin has motion artifact on multiple sequences. It showed disc protrusion, canal stenosis and forminal stenosis, which is likely the etiology for neck pain. No arm numbness or tingling currently.  -will place on on MedSurg bed for observation - pain control: When necessary Percocet and morphine - Robaxin as needed  Left hip pain: etiology is not clear. X-ray of L hip did not show bony fracture. Pending MRI of her left hip. Per ED physician, preliminary report from radiologist is negative for acute bony issues. Patient cannot walk due to severe pain. -pain control as above -PT/OT  Leukocytosis: WBC 11.9. No fever. Likely due to stress-induced demargination. Patient has elevated ESR. Due to severe neck pain and left hip pain, discitis and septal left hip joint are potential differential diagnoses.  -Blood culture -Check CRP -Follow up MRI of left hip  HTN:  -Continue home medications:amlodipine, lisinopril, metoprolol -IV hydralazine prn  Type II diabetes mellitus with complication, uncontrolled ([place]): Last A1c 10.7 on 06/01/17, poorly controled. Patient is taking 75/25 mix insulinat home -will decrease 75/25 insulin from 25-20 units twice a day -SSI  Hyperlipidemia, mixed: -lipitor  ESRD on dialysis (TTS): potassium 4.0, bicarbonate is 24, creatinine 7.08, BUN 47. Patient has been compliant to dialysis. Last dialysis was  On Thursday -please call renal for HD  Anemia due to ESRD: Hgb stable, 10.3. -f/u by CBC  Chronic low back pain: -Pain control as above   DVT ppx: SQ Heparin    Code Status: Full code Family Communication: None at bed side.    Disposition Plan:  Anticipate discharge back to previous home  environment Consults called:  none Admission status: medical floor/obs       Date of Service 09/26/2017    Ivor Costa [facility] Pager [phone]  If 7PM-7AM, please contact night-coverage [email or web address] Password [email or web address] 09/26/2017, 2:09 AM

## 2017-09-26 NOTE — Consult Note (Signed)
[facility] Renal Consultation Note  Indication for Consultation:  Management of ESRD/hemodialysis; anemia, hypertension/volume and secondary hyperparathyroidism  HPI: [name] is a 52 y.o. female with ESRD chronic HD TTS ([facility]) admitted with Neck Pain ( -MRI C-spine: noted cervical spondylosis and C5-C6 disc protrusion and anterior impingement of cervical cord and L hip pain with -MRI left hip degenerative disease .ESR  elevated at  138,CRP is  high at 24.8 Symptoms preceded by L upper thigh rash .NS was consulted  and reviewed MRI "felt that she had a lot of degenerative changes in her cervical spine however does not have an abnormal cord signal and neurological symptoms concerning for compression"    She did not miss her last HD =Sat, but had missed some in earlier portion of month   recent sec. Diarrhea from "ho prior C. Diff., she took some Flagyl and diarrhea improved . Currently no sob,cp , n,v,d  And denies problems with there R UA AVF at HD.Thinks edw appropriate        Past Medical History:  Diagnosis Date  . Anemia   . Arthritis    "knees"  . ESRD (end stage renal disease) ([place])   . Headache(784.0)   . High cholesterol   . Hypertension   . 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 Pericardial effusion   . PVD (peripheral vascular disease) ([place])    Right leg stent in [place].  (No records)  . Type II diabetes mellitus ([place])     Past Surgical History:  Procedure Laterality Date  . [place]; 1997  . PERICARDIAL WINDOW  02/2004   for pericardial effusion  . TUBAL LIGATION  05/1995      Family History  Problem Relation Age of Onset  . Cancer Brother   . Heart disease Father        Died age 71  . Diabetes Father   . Hyperlipidemia Father   . Hypertension Father   . Stroke Father   . Diabetes Mother   . Hypertension Mother   . Stroke Mother   . Dementia Mother   . Diabetes Sister   . Diabetes  Brother   . Hypertension Sister   . Hypertension Brother       reports that she has been smoking cigarettes. She has a 20.00 pack-year smoking history. She has never used smokeless tobacco. She reports that she does not drink alcohol or use drugs.  No Known Allergies  Prior to Admission medications   Medication Sig Start Date End Date Taking? Authorizing Provider  amLODipine (NORVASC) 5 MG tablet Take 5 mg by mouth 2 (two) times daily.  04/28/17  Yes [provider]  atorvastatin (LIPITOR) 80 MG tablet Take 1 tablet (80 mg total) by mouth daily. Take 1/2 tab daily for one week, then increase to the full tab. Patient taking differently: Take 80 mg by mouth daily.  06/01/17  Yes Tereasa Coop, PA-C  cinacalcet (SENSIPAR) 90 MG tablet Take 90 mg by mouth daily. 05/23/17  Yes [provider]  diphenoxylate-atropine (LOMOTIL) 2.5-0.025 MG tablet Take 1 tablet by mouth 4 (four) times daily as needed for diarrhea or loose stools. 09/06/17  Yes Kirichenko, Tatyana, PA-C  gabapentin (NEURONTIN) 100 MG capsule Take 300 mg by mouth daily.  04/28/17  Yes [provider]  hydrOXYzine (ATARAX/VISTARIL) 25 MG tablet Take 25 mg by mouth daily.  05/12/17  Yes [provider]  Insulin Lispro Prot & Lispro (HUMALOG MIX 75/25 KWIKPEN) (75-25) 100 UNIT/ML Kwikpen Inject 25 Units into the skin 2 (two) times daily with a meal.   Yes [provider]  lisinopril (PRINIVIL,ZESTRIL) 40 MG tablet Take 40 mg by mouth daily.  03/17/17  Yes [provider]  metoprolol succinate (TOPROL-XL) 25 MG 24 hr tablet Take 1 tablet (25 mg total) by mouth daily. 06/01/17  Yes Tereasa Coop, PA-C  oxyCODONE (ROXICODONE) 15 MG immediate release tablet Take 15 mg by mouth 3 (three) times daily as needed for pain.  08/24/17  Yes [provider]  pramipexole (MIRAPEX) 0.5 MG tablet Take 0.5 mg by mouth daily.  05/18/17  Yes [provider]  sevelamer carbonate (RENVELA) 800 MG  tablet Take 2,400-4,000 mg by mouth See admin instructions. Take 5 capsules (4000 mg) by mouth up to three times daily with meals and 3 capsules (2400 mg) with snacks   Yes [provider]    HWT:[id number] **OR** acetaminophen, diphenoxylate-atropine, morphine injection, ondansetron **OR** ondansetron (ZOFRAN) IV, oxyCODONE-acetaminophen, senna-docusate, sevelamer carbonate  Results for orders placed or performed during the hospital encounter of 09/25/17 (from the past 48 hour(s))  CBC with Differential/Platelet     Status: Abnormal   Collection Time: 09/25/17  3:09 PM  Result Value Ref Range   WBC 11.9 (H) 4.0 - 10.5 K/uL   RBC 3.39 (L) 3.87 - 5.11 MIL/uL   Hemoglobin 10.3 (L) 12.0 - 15.0 g/dL   HCT 32.4 (L) 36.0 - 46.0 %   MCV 95.6 78.0 - 100.0 fL   MCH 30.4 26.0 - 34.0 pg   MCHC 31.8 30.0 - 36.0 g/dL   RDW 17.2 (H) 11.5 - 15.5 %   Platelets 279 150 - 400 K/uL   Neutrophils Relative % 75 %   Neutro Abs 9.1 (H) 1.7 - 7.7 K/uL   Lymphocytes Relative 13 %   Lymphs Abs 1.5 0.7 - 4.0 K/uL   Monocytes Relative 8 %   Monocytes Absolute 0.9 0.1 - 1.0 K/uL   Eosinophils Relative 2 %   Eosinophils Absolute 0.3 0.0 - 0.7 K/uL   Basophils Relative 1 %   Basophils Absolute 0.1 0.0 - 0.1 K/uL   Immature Granulocytes 1 %   Abs Immature Granulocytes 0.1 0.0 - 0.1 K/uL    Comment: Performed at [facility], [address] [place], [place] [address]  Comprehensive metabolic panel     Status: Abnormal   Collection Time: 09/25/17  3:09 PM  Result Value Ref Range   Sodium 133 (L) 135 - 145 mmol/L   Potassium 4.6 3.5 - 5.1 mmol/L   Chloride 89 (L) 98 - 111 mmol/L   CO2 24 22 - 32 mmol/L   Glucose, Bld 236 (H) 70 - 99 mg/dL   BUN 47 (H) 6 - 20 mg/dL   Creatinine, Ser 7.08 (H) 0.44 - 1.00 mg/dL   Calcium 9.7 8.9 - 10.3 mg/dL   Total Protein 8.4 (H) 6.5 - 8.1 g/dL   Albumin 3.1 (L) 3.5 - 5.0 g/dL   AST 22 15 - 41 U/L   ALT 18 0 - 44 U/L   Alkaline Phosphatase 72 38 - 126 U/L    Total Bilirubin 0.5 0.3 - 1.2 mg/dL   GFR calc non Af Amer 6 (L) >60 mL/min   GFR calc Af Amer 7 (L) >60 mL/min    Comment: (NOTE) The eGFR has been calculated using the CKD EPI equation. This calculation  has not been validated in all clinical situations. eGFR's persistently <60 mL/min signify possible Chronic Kidney Disease.    Anion gap 20 (H) 5 - 15    Comment: Performed at [facility], [place] [address] [place], [place] [address]  Sedimentation rate     Status: Abnormal   Collection Time: 09/25/17  3:09 PM  Result Value Ref Range   Sed Rate 138 (H) 0 - 22 mm/hr    Comment: Performed at [facility], [place] [address] [place], [place] [address]  C-reactive protein     Status: Abnormal   Collection Time: 09/26/17  2:43 AM  Result Value Ref Range   CRP 24.6 (H) <1.0 mg/dL    Comment: Performed at [facility] [address], [place], [place] [address]  Basic metabolic panel     Status: Abnormal   Collection Time: 09/26/17  2:43 AM  Result Value Ref Range   Sodium 131 (L) 135 - 145 mmol/L   Potassium 4.6 3.5 - 5.1 mmol/L   Chloride 87 (L) 98 - 111 mmol/L   CO2 24 22 - 32 mmol/L   Glucose, Bld 167 (H) 70 - 99 mg/dL   BUN 54 (H) 6 - 20 mg/dL   Creatinine, Ser 7.78 (H) 0.44 - 1.00 mg/dL   Calcium 9.2 8.9 - 10.3 mg/dL   GFR calc non Af Amer 5 (L) >60 mL/min   GFR calc Af Amer 6 (L) >60 mL/min    Comment: (NOTE) The eGFR has been calculated using the CKD EPI equation. This calculation has not been validated in all clinical situations. eGFR's persistently <60 mL/min signify possible Chronic Kidney Disease.    Anion gap 20 (H) 5 - 15    Comment: Performed at [facility], [place] [address] [place], [place] [address]  CBC     Status: Abnormal   Collection Time: 09/26/17  2:43 AM  Result Value Ref Range   WBC 10.7 (H) 4.0 - 10.5 K/uL   RBC 3.26 (L) 3.87 - 5.11 MIL/uL   Hemoglobin 9.7 (L) 12.0 - 15.0 g/dL   HCT 31.0 (L) 36.0 - 46.0 %   MCV 95.1 78.0 -  100.0 fL   MCH 29.8 26.0 - 34.0 pg   MCHC 31.3 30.0 - 36.0 g/dL   RDW 17.4 (H) 11.5 - 15.5 %   Platelets 304 150 - 400 K/uL    Comment: Performed at [facility], [place] [address] [place], [place] [address]  Glucose, capillary     Status: Abnormal   Collection Time: 09/26/17  7:32 AM  Result Value Ref Range   Glucose-Capillary 139 (H) 70 - 99 mg/dL   ROS: see hpi   Physical Exam: Vitals:   09/26/17 0520 09/26/17 0908  BP: (!) 136/98 109/67  Pulse: 98 81  Resp: (!) 22 20  Temp: 98.2 F (36.8 C) 98.1 F (36.7 C)  SpO2: 100% (!) 49%     General: Alert Obese AAF sitting in bedside chair ,NAD Appropriate  HEENT:  , MMM. Non icteric   Neck: Pain with rom, no jvd  Heart: RRR  1/6 sem , no rub ,gallop  Lungs: CTA  Abdomen: obese, soft ,NT, ND  Extremities: trace bipedal edema  Skin: Healed R lower leg burn appearance/ Left upper thigh wiith Hyperpigmented Papular lesions  Neuro: Alert OX4, moves all extrem , Neck ROM decreased sec pain  Dialysis Access: Pos bruit  LUA AVF   Dialysis Orders:  Center: [facility]   on TTS . EDW 106.5  HD Bath 2k/ 2.5 Ca   Time 4hr Heparin 4000. Access LUA AVF BFR 450      Calcitriol 1.5 mcg po /HD  Mircera 150 mcg  q 2wks (last given 09/22/17)     Assessment/Plan 1. ESRD -  HD TTS  , HD tomor ( no hd needs today )  2. 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 Hypertension/volume  - on  Metoprolol xl 55m  q day  , amlodipine 574mbid  1070m Lisinopril  40 mg  q day  Fu STANDING  wts  And bp on hd ? Taper med's down as possible   4. Anemia  - hgb 9.7  esa and iron  5. Metabolic bone disease -  Po vit d / binder with meals  ( [place] )  6. DM type 2 - per admit  7. Ho C. Difficile Antigen Positive  / Neg Toxin = Recent diarrhea , ("3 weeks ago")   DavErnest HaberA-C [id number]/2019, 11:03 AM

## 2017-09-27 DIAGNOSIS — I00 Rheumatic fever without heart involvement: Secondary | ICD-10-CM | POA: Diagnosis not present

## 2017-09-27 DIAGNOSIS — M130 Polyarthritis, unspecified: Secondary | ICD-10-CM

## 2017-09-27 DIAGNOSIS — N186 End stage renal disease: Secondary | ICD-10-CM | POA: Diagnosis not present

## 2017-09-27 DIAGNOSIS — M542 Cervicalgia: Secondary | ICD-10-CM | POA: Diagnosis not present

## 2017-09-27 DIAGNOSIS — Z992 Dependence on renal dialysis: Secondary | ICD-10-CM | POA: Diagnosis not present

## 2017-09-27 LAB — CBC
HEMATOCRIT: 28.5 % — AB (ref 36.0–46.0)
Hemoglobin: 9 g/dL — ABNORMAL LOW (ref 12.0–15.0)
MCH: 30.3 pg (ref 26.0–34.0)
MCHC: 31.6 g/dL (ref 30.0–36.0)
MCV: 96 fL (ref 78.0–100.0)
Platelets: 276 10*3/uL (ref 150–400)
RBC: 2.97 MIL/uL — ABNORMAL LOW (ref 3.87–5.11)
RDW: 17.5 % — AB (ref 11.5–15.5)
WBC: 9 10*3/uL (ref 4.0–10.5)

## 2017-09-27 LAB — BASIC METABOLIC PANEL
ANION GAP: 18 — AB (ref 5–15)
BUN: 73 mg/dL — ABNORMAL HIGH (ref 6–20)
CO2: 25 mmol/L (ref 22–32)
CREATININE: 9.56 mg/dL — AB (ref 0.44–1.00)
Calcium: 8.6 mg/dL — ABNORMAL LOW (ref 8.9–10.3)
Chloride: 90 mmol/L — ABNORMAL LOW (ref 98–111)
GFR, EST AFRICAN AMERICAN: 5 mL/min — AB (ref >60)
GFR, EST NON AFRICAN AMERICAN: 4 mL/min — AB (ref >60)
Glucose, Bld: 156 mg/dL — ABNORMAL HIGH (ref 70–99)
Potassium: 4.9 mmol/L (ref 3.5–5.1)
SODIUM: 133 mmol/L — AB (ref 135–145)

## 2017-09-27 LAB — CYCLIC CITRUL PEPTIDE ANTIBODY, IGG/IGA: CCP ANTIBODIES IGG/IGA: 229 U — AB (ref 0–19)

## 2017-09-27 LAB — GLUCOSE, CAPILLARY
GLUCOSE-CAPILLARY: 183 mg/dL — AB (ref 70–99)
GLUCOSE-CAPILLARY: 178 mg/dL — AB (ref 70–99)

## 2017-09-27 LAB — HEPATITIS B SURFACE ANTIGEN: Hepatitis B Surface Ag: NEGATIVE

## 2017-09-27 MED ORDER — PANTOPRAZOLE SODIUM 40 MG PO TBEC: 40.0000 mg | DELAYED_RELEASE_TABLET | Freq: Every day | ORAL | 0 refills | Status: DC

## 2017-09-27 MED ORDER — CALCITRIOL 0.5 MCG PO CAPS
ORAL_CAPSULE | ORAL | Status: AC
  Filled 2017-09-27: qty 3

## 2017-09-27 MED ORDER — PREDNISONE 10 MG (21) PO TBPK: ORAL_TABLET | ORAL | 0 refills | Status: DC

## 2017-09-27 MED ORDER — SODIUM CHLORIDE 0.9 % IV SOLN
125.0000 mg | INTRAVENOUS | Status: DC
  Administered 2017-09-27: 125 mg via INTRAVENOUS
  Filled 2017-09-27: qty 10

## 2017-09-27 NOTE — Discharge Instructions (Signed)
Follow with Primary MD Tereasa Coop, PA-C in 7 days     Activity: As tolerated with Full fall precautions use walker/cane & assistance as needed   Disposition Home    Diet: Renal modified with 1200 cc fluid restriction  For Heart failure patients - Check your Weight same time everyday, if you gain over 2 pounds, or you develop in leg swelling, experience more shortness of breath or chest pain, call your Primary MD immediately. Follow Cardiac Low Salt Diet and 1.5 lit/day fluid restriction.   On your next visit with your primary care physician please Get Medicines reviewed and adjusted.   Please request your Prim.MD to go over all Hospital Tests and Procedure/Radiological results at the follow up, please get all Hospital records sent to your Prim MD by signing hospital release before you go home.   If you experience worsening of your admission symptoms, develop shortness of breath, life threatening emergency, suicidal or homicidal thoughts you must seek medical attention immediately by calling 911 or calling your MD immediately  if symptoms less severe.  You Must read complete instructions/literature along with all the possible adverse reactions/side effects for all the Medicines you take and that have been prescribed to you. Take any new Medicines after you have completely understood and accpet all the possible adverse reactions/side effects.   Do not drive, operating heavy machinery, perform activities at heights, swimming or participation in water activities or provide baby sitting services if your were admitted for syncope or siezures until you have seen by Primary MD or a Neurologist and advised to do so again.  Do not drive when taking Pain medications.    Do not take more than prescribed Pain, Sleep and Anxiety Medications  Special Instructions: If you have smoked or chewed Tobacco  in the last 2 yrs please stop smoking, stop any regular Alcohol  and or any Recreational drug  use.  Wear Seat belts while driving.   Please note  You were cared for by a hospitalist during your hospital stay. If you have any questions about your discharge medications or the care you received while you were in the hospital after you are discharged, you can call the unit and asked to speak with the hospitalist on call if the hospitalist that took care of you is not available. Once you are discharged, your primary care physician will handle any further medical issues. Please note that NO REFILLS for any discharge medications will be authorized once you are discharged, as it is imperative that you return to your primary care physician (or establish a relationship with a primary care physician if you do not have one) for your aftercare needs so that they can reassess your need for medications and monitor your lab values.

## 2017-09-27 NOTE — Progress Notes (Signed)
Subjective:  Feeling a bit better, no issues with HD currently Objective Vital signs in last 24 hours: Vitals:   09/27/17 0830 09/27/17 0859 09/27/17 0900 09/27/17 0930  BP: (!) 90/53 (!) 90/53 (!) 87/44 (!) 85/48  Pulse: 62 62 60 61  Resp:  20    Temp:  97.6 F (36.4 C)    TempSrc:  Oral    SpO2:      Weight:  112.2 kg    Height:  _0  (1.727 m)     Weight change:   Intake/Output Summary (Last 24 hours) at 09/27/2017 0948 Last data filed at 09/27/2017 0600 Gross per 24 hour  Intake 360 ml  Output 0 ml  Net 360 ml    Assessment/ Plan: Pt is a 52 y.o. 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  She says this happened 20 years ago and spontaneous resolved without any diagnosis.  2. ESRD:  EDW 106.5, pre wt today 112.2kg.  Modest hypotension at initiation of HD, discontinued amlodipine.  Turned machine T down.  Reviewed outpatient treatment records recently -- post weights have been 107.4-109 in the past few weeks.  Some shortened tx.  Lowest BPs there usually 90/50s, 1 80/40s.  May need new EDW.   3. Anemia: Hb 9. mircera 150 q2wks. Last dose 8/22.  aranesp 150 qThurs ordered here.  Last iron sat 13% on 8/22 - will give IV iron here.  4. BMM:  Ca ok, Phos binder renvela continued.  Recent phos at HD 6.1. Calcitriol 1.5 TIW 5. HTN/volume:  BP low - discontinued her amlodipine 5 BID. Also has lisinopril 40 on list from H&P.  Remains on toprol 25.   Justin Mend    Labs: Basic Metabolic Panel: Recent Labs  Lab 09/25/17 1509 09/26/17 0243 09/27/17 0354  NA 133* 131* 133*  K 4.6 4.6 4.9  CL 89* 87* 90*  CO2 _1 GLUCOSE 236* 167* 156*  BUN 47* 54* 73*  CREATININE 7.08* 7.78* 9.56*  CALCIUM 9.7 9.2 8.6*   Liver Function Tests: Recent Labs  Lab 09/25/17 1509  AST 22  ALT 18  ALKPHOS 72  BILITOT 0.5  PROT 8.4*   ALBUMIN 3.1*   No results for input(s): LIPASE, AMYLASE in the last 168 hours. No results for input(s): AMMONIA in the last 168 hours. CBC: Recent Labs  Lab 09/25/17 1509 09/26/17 0243 09/27/17 0354  WBC 11.9* 10.7* 9.0  NEUTROABS 9.1*  --   --   HGB 10.3* 9.7* 9.0*  HCT 32.4* 31.0* 28.5*  MCV 95.6 95.1 96.0  PLT 279 304 276   Cardiac Enzymes: No results for input(s): CKTOTAL, CKMB, CKMBINDEX, TROPONINI in the last 168 hours. CBG: Recent Labs  Lab 09/26/17 0732 09/26/17 1158 09/26/17 1618  GLUCAP 139* 184* 183*    Iron Studies: No results for input(s): IRON, TIBC, TRANSFERRIN, FERRITIN in the last 72 hours. Studies/Results: Mr Cervical Spine Wo Contrast  Result Date: 09/26/2017 CLINICAL DATA:  52 y/o F; base of the neck pain radiating into the trapezius muscle. EXAM: MRI CERVICAL SPINE WITHOUT CONTRAST TECHNIQUE: Multiplanar, multisequence MR imaging of the cervical spine was performed. No intravenous contrast was administered. COMPARISON:  07/03/2011 MRI of the cervical spine. FINDINGS: Motion degradation on multiple sequences. Alignment: Physiologic. Vertebrae: No fracture, evidence of discitis, or bone lesion. Cord: No cord signal abnormality. Posterior Fossa,  vertebral arteries, paraspinal tissues: Negative. Disc levels: C2-3: No significant disc displacement, foraminal stenosis, or canal stenosis. C3-4: New small right central disc protrusion with ventral thecal sac effacement. No significant foraminal or canal stenosis. C4-5: Stable disc osteophyte complex eccentric to the left with left-greater-than-right uncovertebral and facet hypertrophy. Mild left-greater-than-right foraminal stenosis and mild canal stenosis. C5-6: Progression of discogenic degenerative changes and central disc protrusion with cord impingement and flattening as well as moderate spinal canal stenosis. Left-greater-than-right uncovertebral and facet hypertrophy contributes to mild right and moderate left  foraminal stenosis. C6-7: Stable disc osteophyte complex with left-greater-than-right uncovertebral and facet hypertrophy. Mild left foraminal stenosis. No significant canal stenosis. C7-T1: No significant disc displacement, foraminal stenosis, or canal stenosis. IMPRESSION: 1. Motion artifact on multiple sequences. 2. No acute osseous or cord signal abnormality identified. 3. Mild progression of cervical spondylosis predominantly at the C5-6 level where a central disc protrusion impinges the anterior cord with cord flattening. 4. Mild C4-5 and moderate C5-6 canal stenosis. 5. Moderate left C5-6 foraminal stenosis. Multilevel mild foraminal stenosis. Electronically Signed   By: Kristine Garbe M.D.   On: 09/26/2017 00:42   Mr Hip Left Wo Contrast  Result Date: 09/26/2017 CLINICAL DATA:  Left hip pain for 2 weeks.  No history of trauma. EXAM: MR OF THE LEFT HIP WITHOUT CONTRAST TECHNIQUE: Multiplanar, multisequence MR imaging was performed. No intravenous contrast was administered. COMPARISON:  Radiographs 09/25/2017 FINDINGS: Moderate bilateral hip joint degenerative changes for age with joint space narrowing, degenerative chondrosis, subchondral cystic change and small joint effusions. No stress fracture or AVN. No bone lesions. The surrounding hip and pelvic musculature appear unremarkable. No muscle tear, myositis or muscle mass. Mild bilateral peritendinitis but no findings for trochanteric bursitis. The pubic symphysis and SI joints are maintained. No findings for septic arthritis. The bony pelvis is intact. Mild bilateral hamstring tendinopathy. No significant intrauterine pelvic abnormalities other than an enlarged fibroid uterus. The left-sided fibroid has increased T1 signal intensity and could be a lipoid leiomyoma. It does not appear separate from the uterus to suggest an endometrioma. IMPRESSION: 1. Moderate bilateral hip joint degenerative changes but no stress fracture or AVN. 2. Intact  bony pelvis. No findings for SI joint or pubic symphysis septic arthritis. 3. Uterine fibroids. Electronically Signed   By: Marijo Sanes M.D.   On: 09/26/2017 08:32   Dg Hip Unilat W Or Wo Pelvis 2-3 Views Left  Result Date: 09/25/2017 CLINICAL DATA:  Leg pain. EXAM: DG HIP (WITH OR WITHOUT PELVIS) 2-3V LEFT COMPARISON:  None. FINDINGS: There is a large calcified fibroid in the uterus, better assessed on previous CT imaging. Vascular calcifications are noted. No acute fractures or dislocations. Mild degenerative changes in the hips. IMPRESSION: Mild degenerative changes in the hips. No other cause for leg pain identified. Large calcified fibroid in the uterus. Electronically Signed   By: Dorise Bullion III M.D   On: 09/25/2017 18:41   Medications: Infusions: . sodium chloride      Scheduled Medications: . amLODipine  5 mg Oral BID  . atorvastatin  80 mg Oral Daily  . calcitRIOL  1.5 mcg Oral Q T,Th,Sa-HD  . cinacalcet  90 mg Oral Q breakfast  . [START ON 10/06/2017] darbepoetin (ARANESP) injection - DIALYSIS  150 mcg Intravenous Q Thu-HD  . gabapentin  300 mg Oral QHS  . heparin  5,000 Units Subcutaneous Q8H  . insulin aspart  0-9 Units Subcutaneous TID WC  . insulin aspart protamine- aspart  20 Units  Subcutaneous BID WC  . metoprolol succinate  25 mg Oral Daily  . naproxen  375 mg Oral BID WC  . nicotine  21 mg Transdermal Daily  . pantoprazole  40 mg Oral Q1200  . polyethylene glycol  17 g Oral Daily  . pramipexole  0.5 mg Oral Daily  . sevelamer carbonate  4,000 mg Oral TID WC    have reviewed scheduled and prn medications.  Physical Exam: General: tired but nontoxic Heart: RRR, II/VI SEM, radial pulse 2+ Lungs: clear ant, normal WOB Abdomen: soft Extremities: 1+ woody pitting edema ankles; R tibia with dressed wound and hypopigmentation from prior wound Dialysis Access: +T/B    09/27/2017,9:48 AM  LOS: 0 days

## 2017-09-27 NOTE — Progress Notes (Signed)
OT Cancellation    09/27/17 0800  OT Visit Information  Last OT Received On 09/27/17  Reason Eval/Treat Not Completed Patient at procedure or test/ unavailable (HD)  Maurie Boettcher, OT/L  OT Clinical Specialist [phone]

## 2017-09-27 NOTE — Procedures (Signed)
I was present at this dialysis session, have reviewed the session itself and made  appropriate changes.  Jannifer Hick MD [facility] pager [phone]   09/27/2017, 9:59 AM

## 2017-09-27 NOTE — Discharge Summary (Addendum)
[name], is a 52 y.o. female  DOB November 06, 1965  MRN [id number].  Admission date:  09/25/2017  Admitting Physician  Ivor Costa, MD  Discharge Date:  09/27/2017   Primary MD  Tereasa Coop, PA-C  Recommendations for primary care physician for things to follow:  -Please follow-up on final work-up of ANA, CCP antibody, and consider referral to rheumatology if results abnormal -Please follow the final results of [facility] spotted fever antibody   Admission Diagnosis  Neck pain [M54.2] Left hip pain [M25.552]   Discharge Diagnosis  Neck pain [M54.2] Left hip pain [M25.552]    Principal Problem:   Neck pain Active Problems:   Hypertension   Type II diabetes mellitus with complication, uncontrolled ([place])   Hyperlipidemia, mixed   ESRD on dialysis ([place])   Anemia   Left hip pain   Leukocytosis      Past Medical History:  Diagnosis Date  . Anemia   . Arthritis    "knees"  . ESRD (end stage renal disease) ([place])   . Headache(784.0)   . High cholesterol   . Hypertension   . 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 Pericardial effusion   . PVD (peripheral vascular disease) ([place])    Right leg stent in [place].  (No records)  . Type II diabetes mellitus ([place])     Past Surgical History:  Procedure Laterality Date  . [place]; 1997  . PERICARDIAL WINDOW  02/2004   for pericardial effusion  . TUBAL LIGATION  05/1995       History of present illness and  Hospital Course:     Kindly see H&P for history of present illness and admission details, please review complete Labs, Consult reports and Test reports for all details in brief  HPI  from the history and physical done on the day of admission  HPI: [name] is a 52 y.o. female with medical history significant of hypertension, hyperlipidemia, ESRD-HD (TTS), PVD, pericardial effusion,  anemia, tobacco abuse, chronic back pain, who presents with neck pain, left hip pain.  Patient states that she has been having neck pain and left hip pain for more than 2 days. The neck pain is located in the posterior neck, constant, sharp, 9 out of 10 in severity, radiating into right arm. Patient states that several days ago, she had right arm pain, which has resolved currently. She does not have arm numbness or weakness. She also reports left hip pain, which is constant, sharp, 9 out of 10 in severity, nonradiating. She states that she cannot walk because of severe left hip pain. She states she has chronic back pain, which has also contributed to inability to walk. She states that her chronic back pain has no significant changes recently. Patient does not have any injury. No loss control of bladder or bowel movement. Patient denies chest pain, shortness of breath, cough. No fever or chills. No nausea, vomiting, diarrhea, abdominal pain, symptoms of  UTI.  ED Course: pt was found to have WBC 11.9, ESR 138, potassium 4.0, bicarbonate is 24, creatinine 7.08, BUN 47, temperature normal, no tachycardia, oxygen satting 93% on room air. X-ray of left hip is negative for bony fracture, but showed degenerative change. Pending MRI-left hip. The patient is placed on MedSurg bed for observation.  MRI-C spin: 1. Motion artifact on multiple sequences. 2. No acute osseous or cord signal abnormality identified. 3. Mild progression of cervical spondylosis predominantly at the C5-6 level where a central disc protrusion impinges the anterior cord with cord flattening. 4. Mild C4-5 and moderate C5-6 canal stenosis. 5. Moderate left C5-6 foraminal stenosis. Multilevel mild foraminal stenosis.   Hospital Course    Severe migratory polyarthritis -Originally involving the left shoulder 3weeks ago followed by right shoulder and now neck and left hip, she does report similar episode 25 years ago, resolved without  intervention, was not worked up then, patient for autoimmune disorder, low significantly elevated CRP and ESR, ANA, anti-CCP antibody are pending at time of discharge, she denies any hip or shoulder pain today, she only reports some neck pain which has been improving, I will discharge her on short prednisone taper only due to her neck arthritic pain, her work-up is positive for autoimmune disease, she need referral to rheumatology as an outpatient.   Severe neck pain -Could be part of above spectrum however given, abnormal MRI C-spine with distal protrusion and impingement of the anterior cervical cord, called and discussed with neurosurgery Dr.Nudelman, he reviewed MRI and felt that she had a lot of degenerative changes in her cervical spine however does not have an abnormal cord signal and neurological symptoms concerning for compression -Her pain most likely related to cervical radiculopathy, discharged on prednisone taper  Left Thigh rash/dermatomal-Varicella Zoster most likely -started 3-4weeks ago, appears like Zoster-healed and hyperpigmented now, no open lesions, no vesicles  -Dr. Broadus John ID Dr.Campbell, no benefit with Antivirals since so late since onset of rash, she denies any pain today   Type 2 diabetes mellitus -Last hemoglobin A1c was 10.7 -Continue insulin 75/25  ESRD on hemodialysis -Stable, she was dialyzed today  Anemia due to chronic kidney disease -stable, continue Epo  Recent diarrhea -Resolved -3 weeks ago C. Difficile antigen was positive with negative toxin suggesting colonization  Hypertension -Overall soft blood pressure during hospital stay, Norvasc and lisinopril has been stopped on discharge   Discharge Condition:  stable   Follow UP  Follow-up Information    Tereasa Coop, PA-C Follow up in 1 week(s).   Specialty:  Urgent Care Contact information: [place] [address] [phone]             Discharge Instructions   and  Discharge Medications     Discharge Instructions    Discharge instructions   Complete by:  As directed    Follow with Primary MD Tereasa Coop, PA-C in 7 days     Activity: As tolerated with Full fall precautions use walker/cane & assistance as needed   Disposition Home    Diet: Renal modified with 1200 cc fluid restriction  For Heart failure patients - Check your Weight same time everyday, if you gain over 2 pounds, or you develop in leg swelling, experience more shortness of breath or chest pain, call your Primary MD immediately. Follow Cardiac Low Salt Diet and 1.5 lit/day fluid restriction.   On your next visit with your primary care physician please Get Medicines reviewed and adjusted.  Please request your Prim.MD to go over all Hospital Tests and Procedure/Radiological results at the follow up, please get all Hospital records sent to your Prim MD by signing hospital release before you go home.   If you experience worsening of your admission symptoms, develop shortness of breath, life threatening emergency, suicidal or homicidal thoughts you must seek medical attention immediately by calling 911 or calling your MD immediately  if symptoms less severe.  You Must read complete instructions/literature along with all the possible adverse reactions/side effects for all the Medicines you take and that have been prescribed to you. Take any new Medicines after you have completely understood and accpet all the possible adverse reactions/side effects.   Do not drive, operating heavy machinery, perform activities at heights, swimming or participation in water activities or provide baby sitting services if your were admitted for syncope or siezures until you have seen by Primary MD or a Neurologist and advised to do so again.  Do not drive when taking Pain medications.    Do not take more than prescribed Pain, Sleep and Anxiety Medications  Special Instructions: If you have  smoked or chewed Tobacco  in the last 2 yrs please stop smoking, stop any regular Alcohol  and or any Recreational drug use.  Wear Seat belts while driving.   Please note  You were cared for by a hospitalist during your hospital stay. If you have any questions about your discharge medications or the care you received while you were in the hospital after you are discharged, you can call the unit and asked to speak with the hospitalist on call if the hospitalist that took care of you is not available. Once you are discharged, your primary care physician will handle any further medical issues. Please note that NO REFILLS for any discharge medications will be authorized once you are discharged, as it is imperative that you return to your primary care physician (or establish a relationship with a primary care physician if you do not have one) for your aftercare needs so that they can reassess your need for medications and monitor your lab values.   Increase activity slowly   Complete by:  As directed      Allergies as of 09/27/2017   No Known Allergies     Medication List    STOP taking these medications   amLODipine 5 MG tablet Commonly known as:  NORVASC   lisinopril 40 MG tablet Commonly known as:  PRINIVIL,ZESTRIL     TAKE these medications   atorvastatin 80 MG tablet Commonly known as:  LIPITOR Take 1 tablet (80 mg total) by mouth daily. Take 1/2 tab daily for one week, then increase to the full tab. What changed:  additional instructions   cinacalcet 90 MG tablet Commonly known as:  SENSIPAR Take 90 mg by mouth daily.   diphenoxylate-atropine 2.5-0.025 MG tablet Commonly known as:  LOMOTIL Take 1 tablet by mouth 4 (four) times daily as needed for diarrhea or loose stools.   gabapentin 100 MG capsule Commonly known as:  NEURONTIN Take 300 mg by mouth daily.   HUMALOG MIX 75/25 KWIKPEN (75-25) 100 UNIT/ML Kwikpen Generic drug:  Insulin Lispro Prot & Lispro Inject 25 Units  into the skin 2 (two) times daily with a meal.   hydrOXYzine 25 MG tablet Commonly known as:  ATARAX/VISTARIL Take 25 mg by mouth daily.   metoprolol succinate 25 MG 24 hr tablet Commonly known as:  TOPROL-XL Take 1 tablet (25 mg  total) by mouth daily.   oxyCODONE 15 MG immediate release tablet Commonly known as:  ROXICODONE Take 15 mg by mouth 3 (three) times daily as needed for pain.   pantoprazole 40 MG tablet Commonly known as:  PROTONIX Take 1 tablet (40 mg total) by mouth daily.   pramipexole 0.5 MG tablet Commonly known as:  MIRAPEX Take 0.5 mg by mouth daily.   predniSONE 10 MG (21) Tbpk tablet Commonly known as:  STERAPRED UNI-PAK 21 TAB Please use per package instruction   RENVELA 800 MG tablet Generic drug:  sevelamer carbonate Take 2,400-4,000 mg by mouth See admin instructions. Take 5 capsules (4000 mg) by mouth up to three times daily with meals and 3 capsules (2400 mg) with snacks         Diet and Activity recommendation: See Discharge Instructions above   Consults obtained -  Renal   Major procedures and Radiology Reports - PLEASE review detailed and final reports for all details, in brief -    Mr Cervical Spine Wo Contrast  Result Date: 09/26/2017 CLINICAL DATA:  52 y/o F; base of the neck pain radiating into the trapezius muscle. EXAM: MRI CERVICAL SPINE WITHOUT CONTRAST TECHNIQUE: Multiplanar, multisequence MR imaging of the cervical spine was performed. No intravenous contrast was administered. COMPARISON:  07/03/2011 MRI of the cervical spine. FINDINGS: Motion degradation on multiple sequences. Alignment: Physiologic. Vertebrae: No fracture, evidence of discitis, or bone lesion. Cord: No cord signal abnormality. Posterior Fossa, vertebral arteries, paraspinal tissues: Negative. Disc levels: C2-3: No significant disc displacement, foraminal stenosis, or canal stenosis. C3-4: New small right central disc protrusion with ventral thecal sac effacement.  No significant foraminal or canal stenosis. C4-5: Stable disc osteophyte complex eccentric to the left with left-greater-than-right uncovertebral and facet hypertrophy. Mild left-greater-than-right foraminal stenosis and mild canal stenosis. C5-6: Progression of discogenic degenerative changes and central disc protrusion with cord impingement and flattening as well as moderate spinal canal stenosis. Left-greater-than-right uncovertebral and facet hypertrophy contributes to mild right and moderate left foraminal stenosis. C6-7: Stable disc osteophyte complex with left-greater-than-right uncovertebral and facet hypertrophy. Mild left foraminal stenosis. No significant canal stenosis. C7-T1: No significant disc displacement, foraminal stenosis, or canal stenosis. IMPRESSION: 1. Motion artifact on multiple sequences. 2. No acute osseous or cord signal abnormality identified. 3. Mild progression of cervical spondylosis predominantly at the C5-6 level where a central disc protrusion impinges the anterior cord with cord flattening. 4. Mild C4-5 and moderate C5-6 canal stenosis. 5. Moderate left C5-6 foraminal stenosis. Multilevel mild foraminal stenosis. Electronically Signed   By: Kristine Garbe M.D.   On: 09/26/2017 00:42   Mr Hip Left Wo Contrast  Result Date: 09/26/2017 CLINICAL DATA:  Left hip pain for 2 weeks.  No history of trauma. EXAM: MR OF THE LEFT HIP WITHOUT CONTRAST TECHNIQUE: Multiplanar, multisequence MR imaging was performed. No intravenous contrast was administered. COMPARISON:  Radiographs 09/25/2017 FINDINGS: Moderate bilateral hip joint degenerative changes for age with joint space narrowing, degenerative chondrosis, subchondral cystic change and small joint effusions. No stress fracture or AVN. No bone lesions. The surrounding hip and pelvic musculature appear unremarkable. No muscle tear, myositis or muscle mass. Mild bilateral peritendinitis but no findings for trochanteric bursitis.  The pubic symphysis and SI joints are maintained. No findings for septic arthritis. The bony pelvis is intact. Mild bilateral hamstring tendinopathy. No significant intrauterine pelvic abnormalities other than an enlarged fibroid uterus. The left-sided fibroid has increased T1 signal intensity and could be a lipoid leiomyoma. It  does not appear separate from the uterus to suggest an endometrioma. IMPRESSION: 1. Moderate bilateral hip joint degenerative changes but no stress fracture or AVN. 2. Intact bony pelvis. No findings for SI joint or pubic symphysis septic arthritis. 3. Uterine fibroids. Electronically Signed   By: Marijo Sanes M.D.   On: 09/26/2017 08:32   Dg Hip Unilat W Or Wo Pelvis 2-3 Views Left  Result Date: 09/25/2017 CLINICAL DATA:  Leg pain. EXAM: DG HIP (WITH OR WITHOUT PELVIS) 2-3V LEFT COMPARISON:  None. FINDINGS: There is a large calcified fibroid in the uterus, better assessed on previous CT imaging. Vascular calcifications are noted. No acute fractures or dislocations. Mild degenerative changes in the hips. IMPRESSION: Mild degenerative changes in the hips. No other cause for leg pain identified. Large calcified fibroid in the uterus. Electronically Signed   By: Dorise Bullion III M.D   On: 09/25/2017 18:41    Micro Results    Recent Results (from the past 240 hour(s))  Culture, blood (Routine X 2) w Reflex to ID Panel     Status: None (Preliminary result)   Collection Time: 09/26/17  2:42 AM  Result Value Ref Range Status   Specimen Description BLOOD RIGHT ANTECUBITAL  Final   Special Requests   Final    BOTTLES DRAWN AEROBIC AND ANAEROBIC Blood Culture results may not be optimal due to an excessive volume of blood received in culture bottles   Culture   Final    NO GROWTH 1 DAY Performed at [facility] [address] [place], [place] [address]    Report Status PENDING  Incomplete  Culture, blood (Routine X 2) w Reflex to ID Panel     Status: None (Preliminary  result)   Collection Time: 09/26/17  6:15 AM  Result Value Ref Range Status   Specimen Description BLOOD RIGHT ANTECUBITAL  Final   Special Requests   Final    BOTTLES DRAWN AEROBIC ONLY Blood Culture results may not be optimal due to an inadequate volume of blood received in culture bottles   Culture   Final    NO GROWTH 1 DAY Performed at [facility], [place] [address], [place], [place] [address]    Report Status PENDING  Incomplete  MRSA PCR Screening     Status: None   Collection Time: 09/26/17 11:28 AM  Result Value Ref Range Status   MRSA by PCR NEGATIVE NEGATIVE Final    Comment:        The GeneXpert MRSA Assay (FDA approved for NASAL specimens only), is one component of a comprehensive MRSA colonization surveillance program. It is not intended to diagnose MRSA infection nor to guide or monitor treatment for MRSA infections. Performed at [facility], [place] [address], [place], [place] [address]        Today   Subjective:   Adileny Delon today has no headache,no chest or abdominal pain,no new weakness tingling or numbness, has any further pain, reports some neck pain. Objective:   Blood pressure (!) 87/45, pulse 64, temperature 97.6 F (36.4 C), temperature source Oral, resp. rate 20, height 5' 8"  (1.727 m), weight 112.2 kg, last menstrual period 03/28/2011, SpO2 100 %.   Intake/Output Summary (Last 24 hours) at 09/27/2017 1218 Last data filed at 09/27/2017 0600 Gross per 24 hour  Intake 360 ml  Output 0 ml  Net 360 ml    Exam Awake Alert, Oriented x 3, No new F.N deficits, Normal affect Symmetrical Chest  wall movement, Good air movement bilaterally, CTAB RRR,No Gallops,Rubs or new Murmurs, No Parasternal Heave +ve B.Sounds, Abd Soft, Non tender,  No rebound -guarding or rigidity. No Cyanosis, Clubbing or edema, No new Rash or bruise  Data Review   CBC w Diff:  Lab Results  Component Value Date   WBC 9.0 09/27/2017   HGB 9.0 (L)  09/27/2017   HGB 12.3 06/01/2017   HCT 28.5 (L) 09/27/2017   HCT 39.5 06/01/2017   PLT 276 09/27/2017   PLT 238 06/01/2017   LYMPHOPCT 13 09/25/2017   MONOPCT 8 09/25/2017   EOSPCT 2 09/25/2017   BASOPCT 1 09/25/2017    CMP:  Lab Results  Component Value Date   NA 133 (L) 09/27/2017   NA 135 06/01/2017   K 4.9 09/27/2017   CL 90 (L) 09/27/2017   CO2 25 09/27/2017   BUN 73 (H) 09/27/2017   BUN 27 (H) 06/01/2017   CREATININE 9.56 (H) 09/27/2017   CREATININE 1.96 (H) 07/19/2011   PROT 8.4 (H) 09/25/2017   PROT 8.0 06/01/2017   ALBUMIN 3.1 (L) 09/25/2017   ALBUMIN 4.4 06/01/2017   BILITOT 0.5 09/25/2017   BILITOT 0.3 06/01/2017   ALKPHOS 72 09/25/2017   AST 22 09/25/2017   ALT 18 09/25/2017  .   Total Time in preparing paper work, data evaluation and todays exam - 47 minutes  Phillips Climes M.D on 09/27/2017 at 12:18 PM  [facility]   Office  [phone]

## 2017-09-27 NOTE — Progress Notes (Signed)
Patient returned from HD stating she was told she could be discharged today. Paged PT, OT, and MD to notify of her return and request for discharge if possible.

## 2017-09-27 NOTE — Progress Notes (Signed)
Occupational Therapy Treatment Patient Details Name: [name] MRN: [id number] DOB: Apr 29, 1965 Today's Date: 09/27/2017    History of present illness [name] is a 52 y.o. female with medical history significant of hypertension, hyperlipidemia, ESRD-HD (TTS), PVD, pericardial effusion, anemia, tobacco abuse, chronic back pain, who presents with neck pain, left hip pain. X-rays of hip were negative for fracture but did show degenerative changes. Please refer to MRI of C-spine for findings.   OT comments  Pt making progress toward goals. Recommend DC home with [place] and DME listed below. Nsg notified.   Follow Up Recommendations  Home health OT;Supervision - Intermittent    Equipment Recommendations  Other (comment)(rollator)    Recommendations for Other Services      Precautions / Restrictions Precautions Precautions: Fall       Mobility Bed Mobility               General bed mobility comments: OOB in chair  Transfers       Sit to Stand: Supervision              Balance     Sitting balance-Leahy Scale: Good       Standing balance-Leahy Scale: Fair                             ADL either performed or assessed with clinical judgement   ADL                                         General ADL Comments: Pt would benefit fromuse of rollator to assist with functional mobility for ADL; Pt issued elastic laces for shoes; educated on home safety and reducing risk of falls     Vision       Perception     Praxis      Cognition Arousal/Alertness: Awake/alert Behavior During Therapy: WFL for tasks assessed/performed Overall Cognitive Status: Within Functional Limits for tasks assessed                                          Exercises     Shoulder Instructions       General Comments      Pertinent Vitals/ Pain       Pain Assessment: Faces Faces Pain Scale: Hurts little more Pain  Location: general discomfort Pain Descriptors / Indicators: Discomfort Pain Intervention(s): Limited activity within patient's tolerance  Home Living                                          Prior Functioning/Environment              Frequency  Min 3X/week        Progress Toward Goals  OT Goals(current goals can now be found in the care plan section)  Progress towards OT goals: Progressing toward goals  Acute Rehab OT Goals Patient Stated Goal: Be able to care for herself, decreased pain OT Goal Formulation: With patient Time For Goal Achievement: 10/10/17 Potential to Achieve Goals: Good ADL Goals Pt Will Perform Grooming: Independently;standing;sitting Pt Will Perform Upper Body Dressing: with modified independence;sitting Pt Will Perform Lower Body  Dressing: with modified independence;with adaptive equipment;sitting/lateral leans Pt Will Transfer to Toilet: with modified independence;ambulating;bedside commode Pt Will Perform Toileting - Clothing Manipulation and hygiene: with modified independence;sitting/lateral leans;sit to/from stand Pt Will Perform Tub/Shower Transfer: Tub transfer;with modified independence;tub bench;rolling walker;ambulating  Plan Discharge plan remains appropriate    Co-evaluation                 AM-PAC PT "6 Clicks" Daily Activity     Outcome Measure   Help from another person eating meals?: None Help from another person taking care of personal grooming?: A Little Help from another person toileting, which includes using toliet, bedpan, or urinal?: A Little Help from another person bathing (including washing, rinsing, drying)?: A Little Help from another person to put on and taking off regular upper body clothing?: None Help from another person to put on and taking off regular lower body clothing?: A Little 6 Click Score: 20    End of Session    OT Visit Diagnosis: Unsteadiness on feet (R26.81);Muscle  weakness (generalized) (M62.81);Pain Pain - part of body: (general discomfort)   Activity Tolerance Patient tolerated treatment well   Patient Left in chair;with call bell/phone within reach   Nurse Communication Other (comment)(DC needs)        Time: 8088-1103 OT Time Calculation (min): 16 min  Charges: OT General Charges $OT Visit: 1 Visit OT Treatments $Self Care/Home Management : 8-22 mins  Maurie Boettcher, OT/L  OT Clinical Specialist [phone]    [facility] 09/27/2017, 1:03 PM

## 2017-09-27 NOTE — Progress Notes (Signed)
Physical Therapy Cancellation Note   09/27/17 1025  PT Visit Information  Last PT Received On 09/27/17  Reason Eval/Treat Not Completed Patient at procedure or test/unavailable (Pt in HD. PT will continue to follow acutely. )   Earney Navy, PTA Pager: [phone]

## 2017-09-27 NOTE — Care Management Obs Status (Signed)
[facility] NOTIFICATION   Patient Details  Name: [name] MRN: [id number] Date of Birth: 10-Dec-1965   Medicare Observation Status Notification Given:  Yes    Erenest Rasher, RN 09/27/2017, 1:04 PM

## 2017-09-27 NOTE — Progress Notes (Signed)
Patient discharged to home with son. After visit Summary reviewed. Patient capable of reverbalizing medications and follow up visits. No signs and symptoms of distress noted. Patient educated to return to the ED in the case of an emergency. Joelys Staubs RN 

## 2017-09-27 NOTE — Care Management (Signed)
Pt will pick up [place] with seat at [employer] on [address] to process for retail pick up. Jonnie Finner RN CCM Case Mgmt phone [phone]

## 2017-09-28 LAB — ROCKY MTN SPOTTED FVR ABS PNL(IGG+IGM)
RMSF IGG: NEGATIVE
RMSF IgM: 0.58 index (ref 0.00–0.89)

## 2017-09-28 LAB — ANA: ANA: NEGATIVE

## 2017-09-29 ENCOUNTER — Encounter (HOSPITAL_BASED_OUTPATIENT_CLINIC_OR_DEPARTMENT_OTHER): Payer: Medicare Other | Attending: Internal Medicine

## 2017-10-01 LAB — CULTURE, BLOOD (ROUTINE X 2)
CULTURE: NO GROWTH
Culture: NO GROWTH

## 2017-10-04 ENCOUNTER — Emergency Department (HOSPITAL_COMMUNITY)
Admission: EM | Admit: 2017-10-04 | Discharge: 2017-10-04 | Discharge status: Against Medical Advice | Payer: Medicare Other | Attending: Emergency Medicine | Admitting: Emergency Medicine

## 2017-10-04 ENCOUNTER — Encounter (HOSPITAL_COMMUNITY): Payer: Self-pay | Admitting: *Deleted

## 2017-10-04 DIAGNOSIS — Z5321 Procedure and treatment not carried out due to patient leaving prior to being seen by health care provider: Secondary | ICD-10-CM | POA: Insufficient documentation

## 2017-10-04 DIAGNOSIS — R197 Diarrhea, unspecified: Secondary | ICD-10-CM | POA: Insufficient documentation

## 2017-10-04 LAB — LIPASE, BLOOD: Lipase: 27 U/L (ref 11–51)

## 2017-10-04 LAB — CBC
HCT: 31.3 % — ABNORMAL LOW (ref 36.0–46.0)
HEMOGLOBIN: 9.9 g/dL — AB (ref 12.0–15.0)
MCH: 29.9 pg (ref 26.0–34.0)
MCHC: 31.6 g/dL (ref 30.0–36.0)
MCV: 94.6 fL (ref 78.0–100.0)
Platelets: 249 10*3/uL (ref 150–400)
RBC: 3.31 MIL/uL — AB (ref 3.87–5.11)
RDW: 16.4 % — ABNORMAL HIGH (ref 11.5–15.5)
WBC: 7.3 10*3/uL (ref 4.0–10.5)

## 2017-10-04 LAB — COMPREHENSIVE METABOLIC PANEL
ALK PHOS: 71 U/L (ref 38–126)
ALT: 14 U/L (ref 0–44)
ANION GAP: 16 — AB (ref 5–15)
AST: 13 U/L — ABNORMAL LOW (ref 15–41)
Albumin: 2.9 g/dL — ABNORMAL LOW (ref 3.5–5.0)
BILIRUBIN TOTAL: 0.8 mg/dL (ref 0.3–1.2)
BUN: 53 mg/dL — ABNORMAL HIGH (ref 6–20)
CALCIUM: 9.3 mg/dL (ref 8.9–10.3)
CO2: 25 mmol/L (ref 22–32)
CREATININE: 7.52 mg/dL — AB (ref 0.44–1.00)
Chloride: 94 mmol/L — ABNORMAL LOW (ref 98–111)
GFR, EST AFRICAN AMERICAN: 6 mL/min — AB (ref >60)
GFR, EST NON AFRICAN AMERICAN: 6 mL/min — AB (ref >60)
Glucose, Bld: 250 mg/dL — ABNORMAL HIGH (ref 70–99)
Potassium: 4.2 mmol/L (ref 3.5–5.1)
SODIUM: 135 mmol/L (ref 135–145)
TOTAL PROTEIN: 7.3 g/dL (ref 6.5–8.1)

## 2017-10-04 LAB — I-STAT BETA HCG BLOOD, ED (MC, WL, AP ONLY)

## 2017-10-04 NOTE — ED Triage Notes (Signed)
Pt is a dialysis patient T,TH, Saturday, was not able to complete treatment this morning due to diarrhea

## 2017-10-04 NOTE — ED Triage Notes (Signed)
Pt in c/o abdominal pain and diarrhea that started back today, recently treated for cdiff and finished her course of flagyl today, diarrhea had improved and returned today, also has a rash on her left leg that she was told is shingles, area is scabbed over, c/o continued pain to that area

## 2017-10-04 NOTE — ED Notes (Signed)
Pt reports she does not want to wait to be seen, she will go to her PCP tomorrow. RN attempted to convince pt to stay, Pt not willing to stay.

## 2017-10-11 DIAGNOSIS — R197 Diarrhea, unspecified: Secondary | ICD-10-CM | POA: Insufficient documentation

## 2017-10-14 ENCOUNTER — Ambulatory Visit: Payer: Medicare Other | Admitting: Podiatry

## 2017-10-31 ENCOUNTER — Other Ambulatory Visit: Payer: Self-pay | Admitting: Internal Medicine

## 2017-10-31 DIAGNOSIS — Z1239 Encounter for other screening for malignant neoplasm of breast: Secondary | ICD-10-CM

## 2017-12-06 DIAGNOSIS — D689 Coagulation defect, unspecified: Secondary | ICD-10-CM | POA: Insufficient documentation

## 2018-01-10 DIAGNOSIS — D509 Iron deficiency anemia, unspecified: Secondary | ICD-10-CM | POA: Insufficient documentation

## 2018-01-20 ENCOUNTER — Other Ambulatory Visit: Payer: Self-pay | Admitting: Podiatry

## 2018-01-20 ENCOUNTER — Encounter: Payer: Self-pay | Admitting: Podiatry

## 2018-01-20 ENCOUNTER — Ambulatory Visit (INDEPENDENT_AMBULATORY_CARE_PROVIDER_SITE_OTHER): Payer: Medicare Other | Admitting: Podiatry

## 2018-01-20 ENCOUNTER — Ambulatory Visit (INDEPENDENT_AMBULATORY_CARE_PROVIDER_SITE_OTHER): Payer: Medicare Other

## 2018-01-20 DIAGNOSIS — R609 Edema, unspecified: Secondary | ICD-10-CM | POA: Diagnosis not present

## 2018-01-20 DIAGNOSIS — M869 Osteomyelitis, unspecified: Secondary | ICD-10-CM

## 2018-01-20 DIAGNOSIS — E1151 Type 2 diabetes mellitus with diabetic peripheral angiopathy without gangrene: Secondary | ICD-10-CM | POA: Diagnosis not present

## 2018-01-20 DIAGNOSIS — I209 Angina pectoris, unspecified: Secondary | ICD-10-CM

## 2018-01-20 DIAGNOSIS — M7989 Other specified soft tissue disorders: Secondary | ICD-10-CM

## 2018-01-20 MED ORDER — DOXYCYCLINE HYCLATE 100 MG PO TABS: 100.0000 mg | ORAL_TABLET | Freq: Two times a day (BID) | ORAL | 0 refills | Status: DC

## 2018-01-22 NOTE — Progress Notes (Signed)
Subjective: 52 year old female presents the office today for concerns of possible infection of the right big toenail.  She went to dialysis that she was told the toenail was loose and then yesterday she noticed there was some drainage coming from around the toe.  She does have a history of a wound to the right big toe that was open and draining for quite some time several years ago.  She does not recall having bone infection that she can remember or that she was told.  No recent injury that she can recall.  Overall she denies any systemic complaints such as fevers, chills, nausea, vomiting. No acute changes since last appointment, and no other complaints at this time.   Objective: AAO x3, NAD DP/PT pulses palpable bilaterally, CRT less than 3 seconds Protective sensation decreased with Simms Weinstein monofilament The right hallux toenail is loose from the underlying nail bed distally but firmly here approximately.  Upon debridement there is no underlying ulceration, drainage or any signs of infection.  I did not elicit any drainage today but she reported.  There is edema to the toe however there is no significant erythema or increase in warmth.  No fluctuation or crepitation. No open lesions or pre-ulcerative lesions.  No pain with calf compression, swelling, warmth, erythema  Assessment: Edema right hallux with concern for infection  Plan: -All treatment options discussed with the patient including all alternatives, risks, complications.  -X-rays were obtained reviewed.  There is cortical changes to the distal phalanx however this appears to be chronic osteomyelitis.  She does have a history of a wound to the toe previously.  However given the swelling to the area and start antibiotics.  I prescribed doxycycline.  I debrided the toenail today now but not able to find any open sores or any drainage.  I dispensed a surgical shoe for her today as well. -Will order blood work (ESR, CRP, CBC,  A1C) -Monitor for any clinical signs or symptoms of infection and directed to call the office immediately should any occur or go to the ER. -Patient encouraged to call the office with any questions, concerns, change in symptoms.   Return in about 1 week (around 01/27/2018). X-ray next appointment.

## 2018-01-23 ENCOUNTER — Telehealth: Payer: Self-pay | Admitting: *Deleted

## 2018-01-23 ENCOUNTER — Encounter: Payer: Self-pay | Admitting: *Deleted

## 2018-01-23 DIAGNOSIS — M7989 Other specified soft tissue disorders: Secondary | ICD-10-CM

## 2018-01-23 DIAGNOSIS — E1151 Type 2 diabetes mellitus with diabetic peripheral angiopathy without gangrene: Secondary | ICD-10-CM

## 2018-01-23 DIAGNOSIS — M869 Osteomyelitis, unspecified: Secondary | ICD-10-CM

## 2018-01-23 NOTE — Telephone Encounter (Signed)
-----  Message from Trula Slade, DPM sent at 01/22/2018 10:47 AM EST ----- I saw her on Friday for concern for infection to the right toe. I would like to get blood work for her (ESR, CRP, CBC and A1c). Can you please order this?  I thought about after she left.  I am not sure if she can get this while she is at dialysis.  Maybe we can fax the order to her dialysis center?

## 2018-01-23 NOTE — Telephone Encounter (Signed)
Mailed copy of labs to pt, with note to have performed at [facility] or if her dialysis agency could perform.

## 2018-01-23 NOTE — Telephone Encounter (Signed)
Unable to contact pt the voice mail box is not set up.

## 2018-01-27 ENCOUNTER — Ambulatory Visit: Payer: Medicare Other | Admitting: Podiatry

## 2018-06-22 ENCOUNTER — Telehealth: Payer: Self-pay | Admitting: Cardiology

## 2018-06-22 NOTE — Telephone Encounter (Signed)
Mychart sent via email, no smartphone, consent, pre reg complete 06/22/18 AF

## 2018-06-26 NOTE — Progress Notes (Signed)
Virtual Visit via Telephone Note   This visit type was conducted due to national recommendations for restrictions regarding the COVID-19 Pandemic (e.g. social distancing) in an effort to limit this patient's exposure and mitigate transmission in our community.  Due to her co-morbid illnesses, this patient is at least at moderate risk for complications without adequate follow up.  This format is felt to be most appropriate for this patient at this time.  The patient did not have access to video technology/had technical difficulties with video requiring transitioning to audio format only (telephone).  All issues noted in this document were discussed and addressed.  No physical exam could be performed with this format.  Please refer to the patient's chart for her  consent to telehealth for [facility].   Date:  06/27/2018   ID:  [name], DOB 07-08-1965, MRN [id number]  Patient Location: Home Provider Location: Home  PCP:  Sandi Mariscal, MD  Cardiologist:  Minus Breeding, MD  Electrophysiologist:  None   Evaluation Performed:  Follow-Up Visit  Chief Complaint:  LVH  History of Present Illness:    [name] is a 53 y.o. female who follows up for  LVH.  He has a history of pericardial effusion with a window in 2006.  A search of old records indicate that this was at the time of a diagnosis of pneumonia.   He also has had severe LVH on echo with the last of these in our system in 2019.  She was to have a cardiac MRI.         She does not recall getting a message about doing the MRI.  She has had some numbness and tingling in her left arm.  She says she gets a squeezing sensation.  Been happening for several months.  It is sporadic.  It is the same arm that she has a fistula.  She says it is not as severe as it was.  Its mild to moderate and less frequent.  She is not describing chest pressure, neck or arm discomfort.  She is not describing associated nausea vomiting or diaphoresis.   She does not have shortness of breath, PND or orthopnea.  She tolerates dialysis well.  She is limited by back pain.  She walks only in her house.   The patient does not have symptoms concerning for COVID-19 infection (fever, chills, cough, or new shortness of breath).    Past Medical History:  Diagnosis Date  . Anemia   . Arthritis    "knees"  . ESRD (end stage renal disease) ([place])   . Headache(784.0)   . High cholesterol   . Hypertension   . 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 Pericardial effusion   . PVD (peripheral vascular disease) ([place])    Right leg stent in [place].  (No records)  . Type II diabetes mellitus ([place])    Past Surgical History:  Procedure Laterality Date  . [place]; 1997  . PERICARDIAL WINDOW  02/2004   for pericardial effusion  . TUBAL LIGATION  05/1995     Current Meds  Medication Sig  . atorvastatin (LIPITOR) 80 MG tablet Take 1 tablet (80 mg total) by mouth daily. Take 1/2 tab daily for one week, then increase to the full tab. (Patient taking differently: Take 80 mg by mouth daily. )  . gabapentin (NEURONTIN) 100 MG capsule Take 300 mg by mouth daily.   Marland Kitchen  hydrOXYzine (ATARAX/VISTARIL) 25 MG tablet Take 25 mg by mouth daily.   . Insulin Lispro Prot & Lispro (HUMALOG MIX 75/25 KWIKPEN) (75-25) 100 UNIT/ML Kwikpen Inject 25 Units into the skin 2 (two) times daily with a meal.  . metoprolol succinate (TOPROL-XL) 25 MG 24 hr tablet Take 1 tablet (25 mg total) by mouth daily.  Marland Kitchen oxyCODONE (ROXICODONE) 15 MG immediate release tablet Take 15 mg by mouth 3 (three) times daily as needed for pain.   . pramipexole (MIRAPEX) 0.5 MG tablet Take 0.5 mg by mouth daily.   . sevelamer carbonate (RENVELA) 800 MG tablet Take 2,400-4,000 mg by mouth See admin instructions. Take 5 capsules (4000 mg) by mouth up to three times daily with meals and 3 capsules (2400 mg) with snacks     Allergies:   Patient has no known allergies.    Social History   Tobacco Use  . Smoking status: Current Every Day Smoker    Packs/day: 1.00    Years: 20.00    Pack years: 20.00    Types: Cigarettes  . Smokeless tobacco: Never Used  . Tobacco comment: She will try.    Substance Use Topics  . Alcohol use: No  . Drug use: No     Family Hx: The patient's family history includes Cancer in her brother; Dementia in her mother; Diabetes in her brother, father, mother, and sister; Heart disease in her father; Hyperlipidemia in her father; Hypertension in her brother, father, mother, and sister; Stroke in her father and mother.  ROS:   Please see the history of present illness.    Limited by back painviewed and are negative.   Prior CV studies:   The following studies were reviewed today:  None  Labs/Other Tests and Data Reviewed:    EKG:  None  Recent Labs: 07/07/2017: B Natriuretic Peptide 901.7; TSH 1.483 07/08/2017: Magnesium 2.0 10/04/2017: ALT 14; BUN 53; Creatinine, Ser 7.52; Hemoglobin 9.9; Platelets 249; Potassium 4.2; Sodium 135   Recent Lipid Panel Lab Results  Component Value Date/Time   CHOL 243 (H) 07/07/2017 09:07 PM   CHOL 261 (H) 06/01/2017 09:59 AM   TRIG 402 (H) 07/07/2017 09:07 PM   HDL 30 (L) 07/07/2017 09:07 PM   HDL 41 06/01/2017 09:59 AM   CHOLHDL 8.1 07/07/2017 09:07 PM   LDLCALC UNABLE TO CALCULATE IF TRIGLYCERIDE OVER 400 mg/dL 07/07/2017 09:07 PM   LDLCALC 147 (H) 06/01/2017 09:59 AM   LDLDIRECT 208 (H) 07/23/2011 11:35 AM    Wt Readings from Last 3 Encounters:  09/27/17 247 lb 5.7 oz (112.2 kg)  09/06/17 245 lb (111.1 kg)  09/04/17 244 lb (110.7 kg)     Objective:    Vital Signs:  LMP 03/28/2011     ASSESSMENT & PLAN:    LVH:   She was to get a cardiac MRI.  She is not sure why we missed this and so I will reschedule.  I will start with an echocardiogram.  She reports that she did not have uncontrolled hypertension.  I will likely follow-up with an MRI based on the results of this.  We  talked about salt and fluid restriction.  Thankfully she remains asymptomatic.    HTN:.The blood pressure is controlled by her report on dialysis.  No change in therapy.   DM:     I don't have a recent A1c.  She says it was six.  In 2019  A1c was 10.  she says this is actively being followed by  her primary provider and I will defer to Sandi Mariscal, MD   CAD: The patient had nonobstructive coronary disease on CTA last year.  And on that she is having any active angina.  She needs aggressive primary risk reduction with control of her blood sugar as above.  ARM PAIN:   The patient is having some atypical arm pain.  This is actually less bothersome than it was several weeks ago.  She had nonobstructive disease less than a year ago on CTA.  She knows to call me if this gets worse but at this point I do not consider this to be an anginal equivalent.  DYSLIPIDEMIA: She is on a high-dose statin.  She needs a fasting lipid profile as I do not see one since May of last year.  This could be drawn at dialysis but I will defer to her nephrologist and primary providers.  Last LDL was 147.  If she is not at target and is compliant with her meds she should be on Crestor 40 mg daily and could be considered for PCSK9 inhibitor in the future if she does not get the target given her nonobstructive disease burden.  COVID-19 Education: The signs and symptoms of COVID-19 were discussed with the patient and how to seek care for testing (follow up with PCP or arrange E-visit).  The importance of social distancing was discussed today.  Time:   Today, I have spent 16 minutes with the patient with telehealth technology discussing the above problems.     Medication Adjustments/Labs and Tests Ordered: Current medicines are reviewed at length with the patient today.  Concerns regarding medicines are outlined above.   Tests Ordered: No orders of the defined types were placed in this encounter.   Medication Changes: No  orders of the defined types were placed in this encounter.   Disposition:  Follow up with me in six months.  Virtual visit OK.   Signed, Minus Breeding, MD  06/27/2018 2:20 PM    [place]

## 2018-06-27 ENCOUNTER — Telehealth (INDEPENDENT_AMBULATORY_CARE_PROVIDER_SITE_OTHER): Payer: Medicare Other | Admitting: Cardiology

## 2018-06-27 ENCOUNTER — Encounter: Payer: Self-pay | Admitting: Cardiology

## 2018-06-27 DIAGNOSIS — I10 Essential (primary) hypertension: Secondary | ICD-10-CM

## 2018-06-27 DIAGNOSIS — I517 Cardiomegaly: Secondary | ICD-10-CM

## 2018-06-27 DIAGNOSIS — I421 Obstructive hypertrophic cardiomyopathy: Secondary | ICD-10-CM

## 2018-06-27 DIAGNOSIS — M79602 Pain in left arm: Secondary | ICD-10-CM | POA: Insufficient documentation

## 2018-06-27 DIAGNOSIS — Z7189 Other specified counseling: Secondary | ICD-10-CM | POA: Insufficient documentation

## 2018-06-27 NOTE — Addendum Note (Signed)
Addended by: Vennie Homans on: 06/27/2018 03:52 PM   Modules accepted: Orders

## 2018-06-27 NOTE — Patient Instructions (Signed)
Medication Instructions:  Continue current medications  If you need a refill on your cardiac medications before your next appointment, please call your pharmacy.  Labwork: None Ordered   Testing/Procedures: Your physician has requested that you have a MRI.  Follow-Up: You will need a follow up appointment in 6 months.  Please call our office 2 months in advance to schedule this appointment.  You may see Minus Breeding, MD or one of the following Advanced Practice Providers on your designated Care Team:   Rosaria Ferries, PA-C . Jory Sims, DNP, ANP     At [facility], you and your health needs are our priority.  As part of our continuing mission to provide you with exceptional heart care, we have created designated Provider Care Teams.  These Care Teams include your primary Cardiologist (physician) and Advanced Practice Providers (APPs -  Physician Assistants and Nurse Practitioners) who all work together to provide you with the care you need, when you need it.  Thank you for choosing [facility] at [facility]!!

## 2018-06-29 ENCOUNTER — Encounter: Payer: Self-pay | Admitting: *Deleted

## 2018-07-20 ENCOUNTER — Telehealth (HOSPITAL_COMMUNITY): Payer: Self-pay | Admitting: Emergency Medicine

## 2018-07-20 NOTE — Telephone Encounter (Signed)
Reaching out to patient to offer assistance regarding upcoming cardiac imaging study; pt verbalizes understanding of appt date/time, parking situation and where to check in,  and verified current allergies; name and call back number provided for further questions should they arise Marchia Bond RN [place] [facility] [phone] office [phone] cell  Pt denies covid symptoms, verbalized understanding of visitor policy.

## 2018-07-21 ENCOUNTER — Other Ambulatory Visit: Payer: Self-pay | Admitting: Cardiology

## 2018-07-21 ENCOUNTER — Other Ambulatory Visit: Payer: Self-pay

## 2018-07-21 ENCOUNTER — Ambulatory Visit (HOSPITAL_COMMUNITY)
Admission: RE | Admit: 2018-07-21 | Discharge: 2018-07-21 | Disposition: A | Discharge status: Home / Self Care | Payer: Medicare Other | Source: Ambulatory Visit | Attending: Cardiology | Admitting: Cardiology

## 2018-07-21 DIAGNOSIS — I517 Cardiomegaly: Secondary | ICD-10-CM

## 2018-07-21 DIAGNOSIS — I421 Obstructive hypertrophic cardiomyopathy: Secondary | ICD-10-CM | POA: Insufficient documentation

## 2018-07-24 ENCOUNTER — Inpatient Hospital Stay (HOSPITAL_COMMUNITY): Admission: RE | Admit: 2018-07-24 | Payer: Medicare Other | Source: Ambulatory Visit

## 2018-07-24 ENCOUNTER — Other Ambulatory Visit (HOSPITAL_COMMUNITY): Payer: Medicare Other

## 2018-08-11 ENCOUNTER — Emergency Department (HOSPITAL_COMMUNITY)
Admission: EM | Admit: 2018-08-11 | Discharge: 2018-08-11 | Disposition: A | Discharge status: Home / Self Care | Payer: Medicare Other | Attending: Emergency Medicine | Admitting: Emergency Medicine

## 2018-08-11 ENCOUNTER — Other Ambulatory Visit: Payer: Self-pay

## 2018-08-11 ENCOUNTER — Emergency Department (HOSPITAL_COMMUNITY): Payer: Medicare Other

## 2018-08-11 ENCOUNTER — Encounter (HOSPITAL_COMMUNITY): Payer: Self-pay

## 2018-08-11 DIAGNOSIS — I120 Hypertensive chronic kidney disease with stage 5 chronic kidney disease or end stage renal disease: Secondary | ICD-10-CM | POA: Diagnosis not present

## 2018-08-11 DIAGNOSIS — Z794 Long term (current) use of insulin: Secondary | ICD-10-CM | POA: Diagnosis not present

## 2018-08-11 DIAGNOSIS — Z992 Dependence on renal dialysis: Secondary | ICD-10-CM | POA: Insufficient documentation

## 2018-08-11 DIAGNOSIS — Z79899 Other long term (current) drug therapy: Secondary | ICD-10-CM | POA: Diagnosis not present

## 2018-08-11 DIAGNOSIS — L03012 Cellulitis of left finger: Secondary | ICD-10-CM | POA: Insufficient documentation

## 2018-08-11 DIAGNOSIS — I2510 Atherosclerotic heart disease of native coronary artery without angina pectoris: Secondary | ICD-10-CM | POA: Diagnosis not present

## 2018-08-11 DIAGNOSIS — N186 End stage renal disease: Secondary | ICD-10-CM | POA: Diagnosis not present

## 2018-08-11 DIAGNOSIS — E1122 Type 2 diabetes mellitus with diabetic chronic kidney disease: Secondary | ICD-10-CM | POA: Insufficient documentation

## 2018-08-11 DIAGNOSIS — R2232 Localized swelling, mass and lump, left upper limb: Secondary | ICD-10-CM | POA: Diagnosis present

## 2018-08-11 DIAGNOSIS — F17210 Nicotine dependence, cigarettes, uncomplicated: Secondary | ICD-10-CM | POA: Insufficient documentation

## 2018-08-11 LAB — CBC WITH DIFFERENTIAL/PLATELET
Abs Immature Granulocytes: 0.06 10*3/uL (ref 0.00–0.07)
Basophils Absolute: 0.1 10*3/uL (ref 0.0–0.1)
Basophils Relative: 1 %
Eosinophils Absolute: 0.4 10*3/uL (ref 0.0–0.5)
Eosinophils Relative: 4 %
HCT: 27.9 % — ABNORMAL LOW (ref 36.0–46.0)
Hemoglobin: 8.9 g/dL — ABNORMAL LOW (ref 12.0–15.0)
Immature Granulocytes: 1 %
Lymphocytes Relative: 9 %
Lymphs Abs: 0.8 10*3/uL (ref 0.7–4.0)
MCH: 30.7 pg (ref 26.0–34.0)
MCHC: 31.9 g/dL (ref 30.0–36.0)
MCV: 96.2 fL (ref 80.0–100.0)
Monocytes Absolute: 0.7 10*3/uL (ref 0.1–1.0)
Monocytes Relative: 8 %
Neutro Abs: 6.9 10*3/uL (ref 1.7–7.7)
Neutrophils Relative %: 77 %
Platelets: 185 10*3/uL (ref 150–400)
RBC: 2.9 MIL/uL — ABNORMAL LOW (ref 3.87–5.11)
RDW: 15.1 % (ref 11.5–15.5)
WBC: 9 10*3/uL (ref 4.0–10.5)
nRBC: 0 % (ref 0.0–0.2)

## 2018-08-11 LAB — BASIC METABOLIC PANEL
Anion gap: 16 — ABNORMAL HIGH (ref 5–15)
BUN: 27 mg/dL — ABNORMAL HIGH (ref 6–20)
CO2: 27 mmol/L (ref 22–32)
Calcium: 9.1 mg/dL (ref 8.9–10.3)
Chloride: 90 mmol/L — ABNORMAL LOW (ref 98–111)
Creatinine, Ser: 5.38 mg/dL — ABNORMAL HIGH (ref 0.44–1.00)
GFR calc Af Amer: 10 mL/min — ABNORMAL LOW (ref >60)
GFR calc non Af Amer: 8 mL/min — ABNORMAL LOW (ref >60)
Glucose, Bld: 476 mg/dL — ABNORMAL HIGH (ref 70–99)
Potassium: 4.2 mmol/L (ref 3.5–5.1)
Sodium: 133 mmol/L — ABNORMAL LOW (ref 135–145)

## 2018-08-11 MED ORDER — AMOXICILLIN-POT CLAVULANATE 500-125 MG PO TABS: 1.0000 | ORAL_TABLET | Freq: Every day | ORAL | 0 refills | Status: DC

## 2018-08-11 MED ORDER — BUPIVACAINE HCL (PF) 0.5 % IJ SOLN
10.0000 mL | Freq: Once | INTRAMUSCULAR | Status: AC
  Administered 2018-08-11: 10 mL
  Filled 2018-08-11: qty 10

## 2018-08-11 MED ORDER — SODIUM CHLORIDE 0.9 % IV BOLUS
500.0000 mL | Freq: Once | INTRAVENOUS | Status: AC
  Administered 2018-08-11: 500 mL via INTRAVENOUS

## 2018-08-11 NOTE — ED Notes (Signed)
Patient transported to X-ray 

## 2018-08-11 NOTE — ED Triage Notes (Addendum)
Pt presents with c/o left thumb pain and infection. Pt states she noticed swelling in her thumb x2 days ago, went to her PCP this morning who recommended she come to the ED for eval. Pt denies drainage from site, obvious swelling noted. Pt endorses hx of diabetes. Denies injury/bite/trauma to thumb. Pt denies fever/cough/SOB/sick contacts. Pt endorses dialysis tx Tues/Thurs/Sat, states received full tx yesterday.

## 2018-08-11 NOTE — ED Notes (Signed)
Patient verbalizes understanding of discharge instructions. Opportunity for questioning and answers were provided. Armband removed by staff, pt discharged from ED.  

## 2018-08-11 NOTE — Discharge Instructions (Addendum)
Your abscess and blister has been drained by elevating your cuticle.  This means that it is essentially a paronychia.  These are infections under the skin at the base of the nail.  To continue to treat this we have sent a prescription antibiotic to your pharmacy.  It will also help to soak the left thumb in warm water, 4 times a day initially, to help the wound drain and heal better.  As the wound heals you can decrease the frequency of the soaking.  Continue soaking until the wound and the blister are completely healed.

## 2018-08-11 NOTE — ED Provider Notes (Signed)
[facility] EMERGENCY DEPARTMENT Provider Note   CSN: [id number] Arrival date & time: 08/11/18  1100    History   Chief Complaint Chief Complaint  Patient presents with  . Hand Pain    HPI [name] is a 53 y.o. female.     HPI   She complains of tenderness and swelling of her left thumb, for several days.  No known trauma.  Swelling started gradually and worsened.  She has pain with movement and palpation of the left thumb.  No prior similar problem.  She denies fever, chills, cough, chest pain, weakness or dizziness.  He is taking her usual medications including insulin.  There are no other known modifying factors.  Past Medical History:  Diagnosis Date  . Anemia   . Arthritis    "knees"  . CAD (coronary artery disease)    Nonobstructive on CT 2019  . ESRD (end stage renal disease) ([place])   . Headache(784.0)   . High cholesterol   . Hypertension   . 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 Pericardial effusion   . PVD (peripheral vascular disease) ([place])    Right leg stent in [place].  (No records)  . Type II diabetes mellitus [facility])     Patient Active Problem List   Diagnosis Date Noted  . Left arm pain 06/27/2018  . Educated About Covid-19 Virus Infection 06/27/2018  . Anemia 09/26/2017  . Left hip pain 09/26/2017  . Neck pain 09/26/2017  . Leukocytosis 09/26/2017  . Chest pain 07/07/2017  . Left ventricular hypertrophy 06/17/2017  . ESRD on dialysis ([place]) 05/21/2011  . Diabetic retinopathy 05/21/2011  . Diastolic dysfunction 46/50/3546  . Hyperlipidemia, mixed 01/30/2011  . Type II diabetes mellitus with complication, uncontrolled ([place]) 01/29/2011  . Neuropathy 01/29/2011  . Hypertension     Past Surgical History:  Procedure Laterality Date  . [place]; 1997  . PERICARDIAL WINDOW  02/2004   for pericardial effusion  . TUBAL LIGATION  05/1995     OB History   No obstetric history on  file.      Home Medications    Prior to Admission medications   Medication Sig Start Date End Date Taking? Authorizing Provider  amoxicillin-clavulanate (AUGMENTIN) 500-125 MG tablet Take 1 tablet (500 mg total) by mouth daily. 08/11/18   Daleen Bo, MD  atorvastatin (LIPITOR) 80 MG tablet Take 1 tablet (80 mg total) by mouth daily. Take 1/2 tab daily for one week, then increase to the full tab. Patient taking differently: Take 80 mg by mouth daily.  06/01/17   Tereasa Coop, PA-C  cinacalcet (SENSIPAR) 90 MG tablet Take 90 mg by mouth daily. 05/23/17   [provider]  diphenoxylate-atropine (LOMOTIL) 2.5-0.025 MG tablet Take 1 tablet by mouth 4 (four) times daily as needed for diarrhea or loose stools. Patient not taking: Reported on 06/27/2018 09/06/17   Jeannett Senior, PA-C  doxycycline (VIBRA-TABS) 100 MG tablet Take 1 tablet (100 mg total) by mouth 2 (two) times daily. Patient not taking: Reported on 06/27/2018 01/20/18   Trula Slade, DPM  gabapentin (NEURONTIN) 100 MG capsule Take 300 mg by mouth daily.  04/28/17   [provider]  hydrOXYzine (ATARAX/VISTARIL) 25 MG tablet Take 25 mg by mouth daily.  05/12/17   [provider]  Insulin Lispro Prot & Lispro (HUMALOG MIX 75/25 KWIKPEN) (75-25) 100 UNIT/ML Kwikpen Inject 25 Units into the skin  2 (two) times daily with a meal.    [provider]  metoprolol succinate (TOPROL-XL) 25 MG 24 hr tablet Take 1 tablet (25 mg total) by mouth daily. 06/01/17   Tereasa Coop, PA-C  oxyCODONE (ROXICODONE) 15 MG immediate release tablet Take 15 mg by mouth 3 (three) times daily as needed for pain.  08/24/17   [provider]  pantoprazole (PROTONIX) 40 MG tablet Take 1 tablet (40 mg total) by mouth daily. Patient not taking: Reported on 06/27/2018 09/27/17   Elgergawy, Silver Huguenin, MD  pramipexole (MIRAPEX) 0.5 MG tablet Take 0.5 mg by mouth daily.  05/18/17   [provider]  predniSONE  (STERAPRED UNI-PAK 21 TAB) 10 MG (21) TBPK tablet Please use per package instruction Patient not taking: Reported on 06/27/2018 09/27/17   Elgergawy, Silver Huguenin, MD  sevelamer carbonate (RENVELA) 800 MG tablet Take 2,400-4,000 mg by mouth See admin instructions. Take 5 capsules (4000 mg) by mouth up to three times daily with meals and 3 capsules (2400 mg) with snacks    [provider]    Family History Family History  Problem Relation Age of Onset  . Cancer Brother   . Heart disease Father        Died age 26  . Diabetes Father   . Hyperlipidemia Father   . Hypertension Father   . Stroke Father   . Diabetes Mother   . Hypertension Mother   . Stroke Mother   . Dementia Mother   . Diabetes Sister   . Diabetes Brother   . Hypertension Sister   . Hypertension Brother     Social History Social History   Tobacco Use  . Smoking status: Current Every Day Smoker    Packs/day: 0.50    Years: 20.00    Pack years: 10.00    Types: Cigarettes  . Smokeless tobacco: Never Used  . Tobacco comment: She will try.    Substance Use Topics  . Alcohol use: No  . Drug use: No     Allergies   Patient has no known allergies.   Review of Systems Review of Systems  All other systems reviewed and are negative.    Physical Exam Updated Vital Signs BP (!) 142/71   Pulse 80   Temp 98.4 F (36.9 C) (Oral)   Resp 14   Ht 5\' 9"  (1.753 m)   Wt 108.9 kg   LMP 03/28/2011   SpO2 100%   BMI 35.44 kg/m   Physical Exam Vitals signs and nursing note reviewed.  Constitutional:      Appearance: She is well-developed.  HENT:     Head: Normocephalic and atraumatic.     Right Ear: External ear normal.     Left Ear: External ear normal.  Eyes:     Conjunctiva/sclera: Conjunctivae normal.     Pupils: Pupils are equal, round, and reactive to light.  Neck:     Musculoskeletal: Normal range of motion and neck supple.     Trachea: Phonation normal.  Cardiovascular:     Rate and  Rhythm: Normal rate.  Pulmonary:     Effort: Pulmonary effort is normal.  Musculoskeletal:     Comments: Left thumb swollen with blister like appearance, dorsally, from base of thumb to base of nail.  There is no clear evidence for paronychia.  Abnormality is all dorsal, not on the palmar surface.  She has pain with attempted flexion of the left thumb.  She is neurovascular distally in  the tip of the left thumb.  Skin:    General: Skin is warm and dry.  Neurological:     Mental Status: She is alert and oriented to person, place, and time.     Cranial Nerves: No cranial nerve deficit.     Sensory: No sensory deficit.     Motor: No abnormal muscle tone.     Coordination: Coordination normal.  Psychiatric:        Mood and Affect: Mood normal.        Behavior: Behavior normal.        Thought Content: Thought content normal.        Judgment: Judgment normal.      ED Treatments / Results  Labs (all labs ordered are listed, but only abnormal results are displayed) Labs Reviewed  BASIC METABOLIC PANEL - Abnormal; Notable for the following components:      Result Value   Sodium 133 (*)    Chloride 90 (*)    Glucose, Bld 476 (*)    BUN 27 (*)    Creatinine, Ser 5.38 (*)    GFR calc non Af Amer 8 (*)    GFR calc Af Amer 10 (*)    Anion gap 16 (*)    All other components within normal limits  CBC WITH DIFFERENTIAL/PLATELET - Abnormal; Notable for the following components:   RBC 2.90 (*)    Hemoglobin 8.9 (*)    HCT 27.9 (*)    All other components within normal limits  AEROBIC CULTURE (SUPERFICIAL SPECIMEN)    EKG None  Radiology Dg Finger Thumb Left  Result Date: 08/11/2018 CLINICAL DATA:  Left thumb pain and swelling with full occult diagnosis of infection. EXAM: LEFT THUMB 2+V COMPARISON:  None. FINDINGS: Diffuse soft tissue swelling. Is also a superficial oval area of gas or air at the level of the proximal phalanx. There is also a small amount of gas adjacent to the 1st  distal tuft. No bone destruction or periosteal reaction seen. IMPRESSION: Diffuse soft tissue swelling of the left thumb with associated soft tissue gas, compatible with infection with a gas-forming organism. No plain radiographic evidence of underlying osteomyelitis. Electronically Signed   By: Claudie Revering M.D.   On: 08/11/2018 12:09    Procedures .Marland KitchenIncision and Drainage  Date/Time: 08/11/2018 1:55 PM Performed by: Daleen Bo, MD Authorized by: Daleen Bo, MD   Consent:    Consent obtained:  Verbal   Consent given by:  Patient   Risks discussed:  Bleeding and incomplete drainage   Alternatives discussed:  No treatment Location:    Type:  Abscess   Size:  4 cm   Location: left thumb. Pre-procedure details:    Skin preparation:  Betadine Anesthesia (see MAR for exact dosages):    Anesthesia method:  Nerve block   Block location:  Left first finger, digital nerve x2   Block anesthetic:  Bupivacaine 0.5% w/o epi   Block technique:  Injection   Block injection procedure:  Anatomic landmarks identified, introduced needle, incremental injection and anatomic landmarks palpated   Block outcome:  Anesthesia achieved Procedure type:    Complexity:  Simple Procedure details:    Needle aspiration: no     Incision type: Lysis of cuticle adhesion to nail with scalpel.   Incision and drainage depth: Scalpel advanced into blister cavity.   Scalpel blade:  11   Drainage:  Serosanguinous   Drainage amount:  Moderate   Wound treatment:  Wound left open Post-procedure  details:    Patient tolerance of procedure:  Tolerated well, no immediate complications Comments:     Left thumb abscess, appeared to be cuticle based, with secondary blistering across the dorsum of the thumb.  Therefore the cuticle was elevated, per usual paronychia drainage technique.  This immediately recovered purulent discharge, and the blister cavity was easily entered with scalpel, then blunt dissection using  scissors.  The cavity of the blister was irrigated with 30 cc of saline, using blunt catheter.  The wound was dressed with a bandage.   (including critical care time)  Medications Ordered in ED Medications  sodium chloride 0.9 % bolus 500 mL (500 mLs Intravenous New Bag/Given 08/11/18 1146)  bupivacaine (MARCAINE) 0.5 % injection 10 mL (10 mLs Infiltration Given by Other 08/11/18 1215)     Initial Impression / Assessment and Plan / ED Course  I have reviewed the triage vital signs and the nursing notes.  Pertinent labs & imaging results that were available during my care of the patient were reviewed by me and considered in my medical decision making (see chart for details).  Clinical Course as of Aug 10 1405  Fri Aug 11, 2018  1307 Left thumb digital nerve block performed, at this time, tolerated well.   [EW]    Clinical Course User Index [EW] Daleen Bo, MD        Patient Vitals for the past 24 hrs:  BP Temp Temp src Pulse Resp SpO2 Height Weight  08/11/18 1329 (!) 142/71 - - 80 14 100 % - -  08/11/18 1122 - - - - - - 5\' 9"  (1.753 m) 108.9 kg  08/11/18 1114 (!) 150/60 98.4 F (36.9 C) Oral 84 16 100 % - -    2:06 PM Reevaluation with update and discussion. After initial assessment and treatment, an updated evaluation reveals she is more comfortable now post drainage.  Findings discussed with the patient and all questions were answered. Daleen Bo   Medical Decision Making: Left thumb abscess, paronychia, with secondary blister of the thumb.  I do not believe this represents a deep tissue infection of the left thumb.  The patient is not systemically ill.  She is stable from the perspective of her end stage renal disease.  Out-Patient management is indicated.  CRITICAL CARE-no Performed by: Daleen Bo  Nursing Notes Reviewed/ Care Coordinated Applicable Imaging Reviewed Interpretation of Laboratory Data incorporated into ED treatment  The patient appears  reasonably screened and/or stabilized for discharge and I doubt any other medical condition or other [facility] requiring further screening, evaluation, or treatment in the ED at this time prior to discharge.  Plan: Home Medications-continue usual, Tylenol for pain; Home Treatments-soaks 4 times daily decreasing frequency as improving; return here if the recommended treatment, does not improve the symptoms; Recommended follow up-PCP, PRN    Final Clinical Impressions(s) / ED Diagnoses   Final diagnoses:  Paronychia of finger of left hand    ED Discharge Orders         Ordered    amoxicillin-clavulanate (AUGMENTIN) 500-125 MG tablet  Daily     08/11/18 1403           Daleen Bo, MD 08/11/18 1408

## 2018-08-13 ENCOUNTER — Emergency Department (HOSPITAL_COMMUNITY)
Admission: EM | Admit: 2018-08-13 | Discharge: 2018-08-13 | Disposition: A | Discharge status: Home / Self Care | Payer: Medicare Other | Attending: Emergency Medicine | Admitting: Emergency Medicine

## 2018-08-13 ENCOUNTER — Other Ambulatory Visit: Payer: Self-pay

## 2018-08-13 ENCOUNTER — Encounter (HOSPITAL_COMMUNITY): Payer: Self-pay | Admitting: *Deleted

## 2018-08-13 DIAGNOSIS — Z992 Dependence on renal dialysis: Secondary | ICD-10-CM | POA: Diagnosis not present

## 2018-08-13 DIAGNOSIS — E782 Mixed hyperlipidemia: Secondary | ICD-10-CM | POA: Insufficient documentation

## 2018-08-13 DIAGNOSIS — I120 Hypertensive chronic kidney disease with stage 5 chronic kidney disease or end stage renal disease: Secondary | ICD-10-CM | POA: Diagnosis not present

## 2018-08-13 DIAGNOSIS — Z79899 Other long term (current) drug therapy: Secondary | ICD-10-CM | POA: Diagnosis not present

## 2018-08-13 DIAGNOSIS — I2510 Atherosclerotic heart disease of native coronary artery without angina pectoris: Secondary | ICD-10-CM | POA: Insufficient documentation

## 2018-08-13 DIAGNOSIS — F17210 Nicotine dependence, cigarettes, uncomplicated: Secondary | ICD-10-CM | POA: Diagnosis not present

## 2018-08-13 DIAGNOSIS — E1122 Type 2 diabetes mellitus with diabetic chronic kidney disease: Secondary | ICD-10-CM | POA: Insufficient documentation

## 2018-08-13 DIAGNOSIS — N186 End stage renal disease: Secondary | ICD-10-CM | POA: Insufficient documentation

## 2018-08-13 DIAGNOSIS — L609 Nail disorder, unspecified: Secondary | ICD-10-CM | POA: Diagnosis present

## 2018-08-13 DIAGNOSIS — L989 Disorder of the skin and subcutaneous tissue, unspecified: Secondary | ICD-10-CM | POA: Insufficient documentation

## 2018-08-13 DIAGNOSIS — Z794 Long term (current) use of insulin: Secondary | ICD-10-CM | POA: Insufficient documentation

## 2018-08-13 DIAGNOSIS — T148XXA Other injury of unspecified body region, initial encounter: Secondary | ICD-10-CM

## 2018-08-13 LAB — CBC WITH DIFFERENTIAL/PLATELET
Abs Immature Granulocytes: 0.04 10*3/uL (ref 0.00–0.07)
Basophils Absolute: 0.1 10*3/uL (ref 0.0–0.1)
Basophils Relative: 1 %
Eosinophils Absolute: 0.4 10*3/uL (ref 0.0–0.5)
Eosinophils Relative: 5 %
HCT: 28 % — ABNORMAL LOW (ref 36.0–46.0)
Hemoglobin: 9 g/dL — ABNORMAL LOW (ref 12.0–15.0)
Immature Granulocytes: 1 %
Lymphocytes Relative: 16 %
Lymphs Abs: 1.2 10*3/uL (ref 0.7–4.0)
MCH: 31 pg (ref 26.0–34.0)
MCHC: 32.1 g/dL (ref 30.0–36.0)
MCV: 96.6 fL (ref 80.0–100.0)
Monocytes Absolute: 0.7 10*3/uL (ref 0.1–1.0)
Monocytes Relative: 9 %
Neutro Abs: 5.3 10*3/uL (ref 1.7–7.7)
Neutrophils Relative %: 68 %
Platelets: 205 10*3/uL (ref 150–400)
RBC: 2.9 MIL/uL — ABNORMAL LOW (ref 3.87–5.11)
RDW: 14.9 % (ref 11.5–15.5)
WBC: 7.8 10*3/uL (ref 4.0–10.5)
nRBC: 0 % (ref 0.0–0.2)

## 2018-08-13 LAB — COMPREHENSIVE METABOLIC PANEL
ALT: 15 U/L (ref 0–44)
AST: 15 U/L (ref 15–41)
Albumin: 2.7 g/dL — ABNORMAL LOW (ref 3.5–5.0)
Alkaline Phosphatase: 66 U/L (ref 38–126)
Anion gap: 16 — ABNORMAL HIGH (ref 5–15)
BUN: 37 mg/dL — ABNORMAL HIGH (ref 6–20)
CO2: 27 mmol/L (ref 22–32)
Calcium: 8.8 mg/dL — ABNORMAL LOW (ref 8.9–10.3)
Chloride: 91 mmol/L — ABNORMAL LOW (ref 98–111)
Creatinine, Ser: 6.18 mg/dL — ABNORMAL HIGH (ref 0.44–1.00)
GFR calc Af Amer: 8 mL/min — ABNORMAL LOW (ref >60)
GFR calc non Af Amer: 7 mL/min — ABNORMAL LOW (ref >60)
Glucose, Bld: 311 mg/dL — ABNORMAL HIGH (ref 70–99)
Potassium: 3.9 mmol/L (ref 3.5–5.1)
Sodium: 134 mmol/L — ABNORMAL LOW (ref 135–145)
Total Bilirubin: 0.6 mg/dL (ref 0.3–1.2)
Total Protein: 7.2 g/dL (ref 6.5–8.1)

## 2018-08-13 LAB — AEROBIC CULTURE W GRAM STAIN (SUPERFICIAL SPECIMEN)

## 2018-08-13 LAB — AEROBIC CULTURE? (SUPERFICIAL SPECIMEN)

## 2018-08-13 MED ORDER — LOPERAMIDE HCL 2 MG PO CAPS: 2.0000 mg | ORAL_CAPSULE | Freq: Once | ORAL | Status: DC

## 2018-08-13 NOTE — ED Triage Notes (Signed)
Pt had I&D to her L thumb on Friday. Today Pt noticed her nail had come off her thumb. Bandage removed. Nail is attached to epidermis that has sloughed off of thumb. Pt denies pain. Hx of DM, takes insulin and reports her blood sugar is anywhere from 80-300

## 2018-08-13 NOTE — ED Provider Notes (Signed)
[facility] EMERGENCY DEPARTMENT Provider Note   CSN: [id number] Arrival date & time: 08/13/18  1905    History   Chief Complaint Chief Complaint  Patient presents with  . Nail Problem    HPI [name] is a 53 y.o. female.     HPI  Patient presents 2 days after paronychia intervention here, now with concern for sloughing of the skin and nail of the left thumb. In general she states that she feels good, fine, denies fever or other complaints per However, today, now 2 days after initially being seen for engorgement, pain in the left thumb she has had notable changes including sloughing of the skin, and loss of the nail. Mild discomfort, but no loss of function, no loss of sensation.  Past Medical History:  Diagnosis Date  . Anemia   . Arthritis    "knees"  . CAD (coronary artery disease)    Nonobstructive on CT 2019  . ESRD (end stage renal disease) ([place])   . Headache(784.0)   . High cholesterol   . Hypertension   . 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 Pericardial effusion   . PVD (peripheral vascular disease) ([place])    Right leg stent in [place].  (No records)  . Type II diabetes mellitus [facility])     Patient Active Problem List   Diagnosis Date Noted  . Left arm pain 06/27/2018  . Educated About Covid-19 Virus Infection 06/27/2018  . Anemia 09/26/2017  . Left hip pain 09/26/2017  . Neck pain 09/26/2017  . Leukocytosis 09/26/2017  . Chest pain 07/07/2017  . Left ventricular hypertrophy 06/17/2017  . ESRD on dialysis ([place]) 05/21/2011  . Diabetic retinopathy 05/21/2011  . Diastolic dysfunction 75/11/2583  . Hyperlipidemia, mixed 01/30/2011  . Type II diabetes mellitus with complication, uncontrolled ([place]) 01/29/2011  . Neuropathy 01/29/2011  . Hypertension     Past Surgical History:  Procedure Laterality Date  . [place]; 1997  . PERICARDIAL WINDOW  02/2004   for pericardial effusion  .  TUBAL LIGATION  05/1995     OB History   No obstetric history on file.      Home Medications    Prior to Admission medications   Medication Sig Start Date End Date Taking? Authorizing Provider  amoxicillin-clavulanate (AUGMENTIN) 500-125 MG tablet Take 1 tablet (500 mg total) by mouth daily. 08/11/18   Daleen Bo, MD  atorvastatin (LIPITOR) 80 MG tablet Take 1 tablet (80 mg total) by mouth daily. Take 1/2 tab daily for one week, then increase to the full tab. Patient taking differently: Take 80 mg by mouth daily.  06/01/17   Tereasa Coop, PA-C  cinacalcet (SENSIPAR) 90 MG tablet Take 90 mg by mouth daily. 05/23/17   [provider]  diphenoxylate-atropine (LOMOTIL) 2.5-0.025 MG tablet Take 1 tablet by mouth 4 (four) times daily as needed for diarrhea or loose stools. Patient not taking: Reported on 06/27/2018 09/06/17   Jeannett Senior, PA-C  doxycycline (VIBRA-TABS) 100 MG tablet Take 1 tablet (100 mg total) by mouth 2 (two) times daily. Patient not taking: Reported on 06/27/2018 01/20/18   Trula Slade, DPM  gabapentin (NEURONTIN) 100 MG capsule Take 300 mg by mouth daily.  04/28/17   [provider]  hydrOXYzine (ATARAX/VISTARIL) 25 MG tablet Take 25 mg by mouth daily.  05/12/17   [provider]  Insulin Lispro Prot & Lispro (HUMALOG MIX 75/25 KWIKPEN) (  75-25) 100 UNIT/ML Kwikpen Inject 25 Units into the skin 2 (two) times daily with a meal.    [provider]  metoprolol succinate (TOPROL-XL) 25 MG 24 hr tablet Take 1 tablet (25 mg total) by mouth daily. 06/01/17   Tereasa Coop, PA-C  oxyCODONE (ROXICODONE) 15 MG immediate release tablet Take 15 mg by mouth 3 (three) times daily as needed for pain.  08/24/17   [provider]  pantoprazole (PROTONIX) 40 MG tablet Take 1 tablet (40 mg total) by mouth daily. Patient not taking: Reported on 06/27/2018 09/27/17   Elgergawy, Silver Huguenin, MD  pramipexole (MIRAPEX) 0.5 MG tablet Take 0.5 mg by  mouth daily.  05/18/17   [provider]  predniSONE (STERAPRED UNI-PAK 21 TAB) 10 MG (21) TBPK tablet Please use per package instruction Patient not taking: Reported on 06/27/2018 09/27/17   Elgergawy, Silver Huguenin, MD  sevelamer carbonate (RENVELA) 800 MG tablet Take 2,400-4,000 mg by mouth See admin instructions. Take 5 capsules (4000 mg) by mouth up to three times daily with meals and 3 capsules (2400 mg) with snacks    [provider]    Family History Family History  Problem Relation Age of Onset  . Cancer Brother   . Heart disease Father        Died age 68  . Diabetes Father   . Hyperlipidemia Father   . Hypertension Father   . Stroke Father   . Diabetes Mother   . Hypertension Mother   . Stroke Mother   . Dementia Mother   . Diabetes Sister   . Diabetes Brother   . Hypertension Sister   . Hypertension Brother     Social History Social History   Tobacco Use  . Smoking status: Current Every Day Smoker    Packs/day: 0.50    Years: 20.00    Pack years: 10.00    Types: Cigarettes  . Smokeless tobacco: Never Used  . Tobacco comment: She will try.    Substance Use Topics  . Alcohol use: No  . Drug use: No     Allergies   Patient has no known allergies.   Review of Systems Review of Systems  Constitutional:       Per HPI, otherwise negative  HENT:       Per HPI, otherwise negative  Respiratory:       Per HPI, otherwise negative  Cardiovascular:       Per HPI, otherwise negative  Gastrointestinal: Negative for vomiting.  Endocrine:       Negative aside from HPI  Genitourinary:       Neg aside from HPI   Musculoskeletal:       Per HPI, otherwise negative  Skin: Positive for wound.  Allergic/Immunologic: Positive for immunocompromised state.  Neurological: Negative for syncope.     Physical Exam Updated Vital Signs BP 128/64   Pulse 81   Temp 98 F (36.7 C) (Oral)   Resp 16   LMP 03/28/2011   SpO2 96%   Physical Exam Vitals  signs and nursing note reviewed.  Constitutional:      General: She is not in acute distress.    Appearance: She is well-developed.  HENT:     Head: Normocephalic and atraumatic.  Eyes:     Conjunctiva/sclera: Conjunctivae normal.  Cardiovascular:     Rate and Rhythm: Normal rate and regular rhythm.     Pulses: Normal pulses.  Pulmonary:     Effort: Pulmonary effort is  normal. No respiratory distress.  Abdominal:     General: There is no distension.  Musculoskeletal:     Comments: Abnormality left thumb as in the picture, but no swelling, no change in range of motion  Skin:    General: Skin is warm and dry.     Comments: Wound as above  Neurological:     Mental Status: She is alert and oriented to person, place, and time.     Cranial Nerves: No cranial nerve deficit.      ED Treatments / Results  Labs (all labs ordered are listed, but only abnormal results are displayed) Labs Reviewed  CBC WITH DIFFERENTIAL/PLATELET - Abnormal; Notable for the following components:      Result Value   RBC 2.90 (*)    Hemoglobin 9.0 (*)    HCT 28.0 (*)    All other components within normal limits  COMPREHENSIVE METABOLIC PANEL - Abnormal; Notable for the following components:   Sodium 134 (*)    Chloride 91 (*)    Glucose, Bld 311 (*)    BUN 37 (*)    Creatinine, Ser 6.18 (*)    Calcium 8.8 (*)    Albumin 2.7 (*)    GFR calc non Af Amer 7 (*)    GFR calc Af Amer 8 (*)    Anion gap 16 (*)    All other components within normal limits    EKG None  Radiology No results found.  Procedures Procedures (including critical care time)       Medications Ordered in ED Medications - No data to display   Initial Impression / Assessment and Plan / ED Course  I have reviewed the triage vital signs and the nursing notes.  Pertinent labs & imaging results that were available during my care of the patient were reviewed by me and considered in my medical decision making (see chart  for details).        8:48 PM Patient in no distress, awake, alert. I discussed her presentation with orthopedic surgeon on call. With reassuring labs, vitals, no ascending erythema, there is no evidence for deep space infection or bacteremia, sepsis. Some suspicion for epidermal lysis Patient has received nonadherent dressing, bulky protection, will continue taking antibiotics, will follow-up with him in the office this week.  Final Clinical Impressions(s) / ED Diagnoses   Final diagnoses:  Open wound     Carmin Muskrat, MD 08/13/18 2050

## 2018-08-13 NOTE — Discharge Instructions (Addendum)
Please keep the wound clean, dry, with nonadherent dressing in place. Continue to take your antibiotics. Please be sure to follow-up with your orthopedic specialist or return here for concerning changes in your condition.

## 2018-08-14 ENCOUNTER — Telehealth: Payer: Self-pay

## 2018-08-14 ENCOUNTER — Encounter: Payer: Self-pay | Admitting: Family Medicine

## 2018-08-14 ENCOUNTER — Other Ambulatory Visit: Payer: Self-pay

## 2018-08-14 ENCOUNTER — Ambulatory Visit (INDEPENDENT_AMBULATORY_CARE_PROVIDER_SITE_OTHER): Payer: Medicare Other | Admitting: Family Medicine

## 2018-08-14 DIAGNOSIS — M79645 Pain in left finger(s): Secondary | ICD-10-CM | POA: Diagnosis not present

## 2018-08-14 DIAGNOSIS — M86142 Other acute osteomyelitis, left hand: Secondary | ICD-10-CM | POA: Diagnosis not present

## 2018-08-14 NOTE — Telephone Encounter (Signed)
Post ED Visit - Positive Culture Follow-up  Culture report reviewed by antimicrobial stewardship pharmacist: [place] Team []  Elenor Quinones, Pharm.D. []  Heide Guile, Pharm.D., BCPS AQ-ID []  Parks Neptune, Pharm.D., BCPS []  Alycia Rossetti, Pharm.D., BCPS []  [place], Pharm.D., BCPS, AAHIVP []  Legrand Como, Pharm.D., BCPS, AAHIVP []  Salome Arnt, PharmD, BCPS []  Johnnette Gourd, PharmD, BCPS []  Hughes Better, PharmD, BCPS []  Leeroy Cha, PharmD [x]  Laqueta Linden, PharmD, BCPS []  Albertina Parr, PharmD  [place] Team []  Leodis Sias, PharmD []  Lindell Spar, PharmD []  Royetta Asal, PharmD []  Graylin Shiver, Rph []  Rema Fendt) Glennon Mac, PharmD []  Arlyn Dunning, PharmD []  Netta Cedars, PharmD []  Dia Sitter, PharmD []  Leone Haven, PharmD []  Gretta Arab, PharmD []  Theodis Shove, PharmD []  Peggyann Juba, PharmD []  Reuel Boom, PharmD   Positive urine culture Treated with amoxicillin, organism sensitive to the same and no further patient follow-up is required at this time.  Genia Del 08/14/2018, 11:41 AM

## 2018-08-14 NOTE — Progress Notes (Signed)
Office Visit Note   Patient: [name]           Date of Birth: 11/04/1965           MRN: [id number] Visit Date: 08/14/2018 Requested by: Sandi Mariscal, [place],  [place] [id number] PCP: Sandi Mariscal, MD  Subjective: Chief Complaint  Patient presents with  . Left Thumb - Pain    HPI: She is a 53 year old right-hand-dominant female with left thumb infection.  3 days ago she had pain and swelling in her thumb with no injury.  She went to the ER where they evacuated a paronychia.  Over the weekend her thumb blistered and a large amount of skin sloughed off so she went back to the ER and they recommend coming in today.  She is a diabetic on dialysis, end-stage renal disease.  She has hypertension as well.  Her blood sugars have been relatively stable and she is not having any fevers.               ROS:   All other systems were reviewed and are negative.  Objective: Vital Signs: LMP 03/28/2011   Physical Exam:  General:  Alert and oriented, in no acute distress. Pulm:  Breathing unlabored. Psy:  Normal mood, congruent affect. Left thumb: From the IP joint distally her skin has sloughed off.  It looks similar to the photo from the ER yesterday.  She does not have a thumbnail any longer, there is no active drainage but with a sterile Q-tip her distal phalanx can be probed.  Dr. Marlou Sa examined the patient as well.   Imaging: None today.  Assessment & Plan: 1.  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 -Dr. Erlinda Hong was on hand call over the weekend.  She will come back to see him in the morning to discuss surgical options which could include partial amputation of her thumb.  She understands this.  We will continue Augmentin for now.  Bandage was applied.     Procedures: No procedures performed  No notes on file     PMFS History: Patient Active Problem List   Diagnosis Date Noted  . Left arm pain  06/27/2018  . Educated About Covid-19 Virus Infection 06/27/2018  . Anemia 09/26/2017  . Left hip pain 09/26/2017  . Neck pain 09/26/2017  . Leukocytosis 09/26/2017  . Chest pain 07/07/2017  . Left ventricular hypertrophy 06/17/2017  . ESRD on dialysis ([place]) 05/21/2011  . Diabetic retinopathy 05/21/2011  . Diastolic dysfunction 18/56/3149  . Hyperlipidemia, mixed 01/30/2011  . Type II diabetes mellitus with complication, uncontrolled ([place]) 01/29/2011  . Neuropathy 01/29/2011  . Hypertension    Past Medical History:  Diagnosis Date  . Anemia   . Arthritis    "knees"  . CAD (coronary artery disease)    Nonobstructive on CT 2019  . ESRD (end stage renal disease) ([facility])   . Headache(784.0)   . High cholesterol   . Hypertension   . 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 Pericardial effusion   . PVD (peripheral vascular disease) ([facility])    Right leg stent in [place].  (No records)  . Type II diabetes mellitus ([facility])     Family History  Problem Relation Age of Onset  . Cancer Brother   . Heart disease Father        Died age 47  . Diabetes Father   .  Hyperlipidemia Father   . Hypertension Father   . Stroke Father   . Diabetes Mother   . Hypertension Mother   . Stroke Mother   . Dementia Mother   . Diabetes Sister   . Diabetes Brother   . Hypertension Sister   . Hypertension Brother     Past Surgical History:  Procedure Laterality Date  . [place]; 1997  . PERICARDIAL WINDOW  02/2004   for pericardial effusion  . TUBAL LIGATION  05/1995   Social History   Occupational History  . Not on file  Tobacco Use  . Smoking status: Current Every Day Smoker    Packs/day: 0.50    Years: 20.00    Pack years: 10.00    Types: Cigarettes  . Smokeless tobacco: Never Used  . Tobacco comment: She will try.    Substance and Sexual Activity  . Alcohol use: No  . Drug use: No  . Sexual activity: Not Currently

## 2018-08-15 ENCOUNTER — Encounter: Payer: Self-pay | Admitting: Orthopaedic Surgery

## 2018-08-15 ENCOUNTER — Ambulatory Visit (INDEPENDENT_AMBULATORY_CARE_PROVIDER_SITE_OTHER): Payer: Medicare Other | Admitting: Orthopaedic Surgery

## 2018-08-15 ENCOUNTER — Telehealth: Payer: Self-pay

## 2018-08-15 DIAGNOSIS — M79645 Pain in left finger(s): Secondary | ICD-10-CM

## 2018-08-15 MED ORDER — AMOXICILLIN-POT CLAVULANATE 500-125 MG PO TABS: 1.0000 | ORAL_TABLET | Freq: Two times a day (BID) | ORAL | 0 refills | Status: DC

## 2018-08-15 NOTE — Telephone Encounter (Signed)
Dr Erlinda Hong ordered STAT MRI scan and urgent referral to wound care center.  Abigail Butts can you please schedule scan. Fleeta Emmer can you please work on referral.

## 2018-08-15 NOTE — Progress Notes (Signed)
Office Visit Note   Patient: [name]           Date of Birth: 1965-09-07           MRN: [id number] Visit Date: 08/15/2018              Requested by: Sandi Mariscal, [place],  [place] [address] PCP: Sandi Mariscal, MD   Assessment & Plan: Visit Diagnoses:  1. Pain of left thumb     Plan: I think given how quickly this is transpired I do not feel that she has osteomyelitis at this time.  X-rays were negative.  However I would like to obtain an MRI to better evaluate for osteomyelitis.  In terms of the epidermolysis, I will treat this like a burn injury with local wound care in terms of wet-to-dry dressings for now.  We will send an urgent referral to the wound center for further management of this.  I have extended her Augmentin.  I will touch base with her with the results of the MRI.  All of this was explained to the sister over the phone.  All parties in agreement. Total face to face encounter time was greater than 25 minutes and over half of this time was spent in counseling and/or coordination of care.  Follow-Up Instructions: No follow-ups on file.   Orders:  No orders of the defined types were placed in this encounter.  Meds ordered this encounter  Medications   amoxicillin-clavulanate (AUGMENTIN) 500-125 MG tablet    Sig: Take 1 tablet (500 mg total) by mouth 2 (two) times a day.    Dispense:  30 tablet    Refill:  0      Procedures: No procedures performed   Clinical Data: No additional findings.   Subjective: Chief Complaint  Patient presents with   Hand Pain    Ms. Para is a 53 year old female who comes in for evaluation of her left thumb.  Briefly she went to the ED on 08/11/2018 for a paronychia that was drained at the bedside by the ED physician.  She was placed on Augmentin and cultures have so far grown out MSSA.  She denies any fevers any pain in her left thumb she is on hemodialysis.  She returned back to the ED 2 days ago because  of significant swelling of her left thumb and epidermolysis.  She was nonseptic.  She was evaluated by 2 of my partners yesterday who then sent her to me for further evaluation.  She states that she has continued to be afebrile and she really does not have any pain.  She has not had any significant drainage.   Review of Systems  Constitutional: Negative.   HENT: Negative.   Eyes: Negative.   Respiratory: Negative.   Cardiovascular: Negative.   Endocrine: Negative.   Musculoskeletal: Negative.   Neurological: Negative.   Hematological: Negative.   Psychiatric/Behavioral: Negative.   All other systems reviewed and are negative.    Objective: Vital Signs: LMP 03/28/2011   Physical Exam Vitals signs and nursing note reviewed.  Constitutional:      Appearance: She is well-developed.  Pulmonary:     Effort: Pulmonary effort is normal.  Skin:    General: Skin is warm.     Capillary Refill: Capillary refill takes less than 2 seconds.  Neurological:     Mental Status: She is alert and oriented to person, place, and time.  Psychiatric:  Behavior: Behavior normal.        Thought Content: Thought content normal.        Judgment: Judgment normal.     Ortho Exam She has severe swelling of the left thumb.  There is no soft tissue crepitus.  There is no drainage.  There is fibrinous exudative tissue near the paronychia.  The nail appears to have chronic onychomycosis.  She mainly has circumferential epidermolysis of the thumb to about the level of the MP joint.  She is able to move her thumb actually quite well without pain.  She has intact sensation.  Specialty Comments:  No specialty comments available.  Imaging: No results found.   PMFS History: Patient Active Problem List   Diagnosis Date Noted   Left arm pain 06/27/2018   Educated About Covid-19 Virus Infection 06/27/2018   Anemia 09/26/2017   Left hip pain 09/26/2017   Neck pain 09/26/2017   Leukocytosis  09/26/2017   Chest pain 07/07/2017   Left ventricular hypertrophy 06/17/2017   ESRD on dialysis ([facility]) 05/21/2011   Diabetic retinopathy 03/88/8280   Diastolic dysfunction 03/49/1791   Hyperlipidemia, mixed 01/30/2011   Type II diabetes mellitus with complication, uncontrolled ([facility]) 01/29/2011   Neuropathy 01/29/2011   Hypertension    Past Medical History:  Diagnosis Date   Anemia    Arthritis    "knees"   CAD (coronary artery disease)    Nonobstructive on CT 2019   ESRD (end stage renal disease) ([facility])    Headache(784.0)    High cholesterol    Hypertension    Nerve pain    "they say I have L5 nerve damage; my lumbar"   Pericardial effusion    PVD (peripheral vascular disease) ([place])    Right leg stent in [place].  (No records)   Type II diabetes mellitus ([facility])     Family History  Problem Relation Age of Onset   Cancer Brother    Heart disease Father        Died age 20   Diabetes Father    Hyperlipidemia Father    Hypertension Father    Stroke Father    Diabetes Mother    Hypertension Mother    Stroke Mother    Dementia Mother    Diabetes Sister    Diabetes Brother    Hypertension Sister    Hypertension Brother     Past Surgical History:  Procedure Laterality Date   CESAREAN SECTION  1996; [place]  02/2004   for pericardial effusion   TUBAL LIGATION  05/1995   Social History   Occupational History   Not on file  Tobacco Use   Smoking status: Current Every Day Smoker    Packs/day: 0.50    Years: 20.00    Pack years: 10.00    Types: Cigarettes   Smokeless tobacco: Never Used   Tobacco comment: She will try.    Substance and Sexual Activity   Alcohol use: No   Drug use: No   Sexual activity: Not Currently

## 2018-08-16 ENCOUNTER — Encounter: Payer: Medicare Other | Attending: Internal Medicine | Admitting: Internal Medicine

## 2018-08-16 ENCOUNTER — Other Ambulatory Visit: Payer: Self-pay

## 2018-08-16 ENCOUNTER — Telehealth: Payer: Self-pay | Admitting: Radiology

## 2018-08-16 DIAGNOSIS — M0580 Other rheumatoid arthritis with rheumatoid factor of unspecified site: Secondary | ICD-10-CM | POA: Insufficient documentation

## 2018-08-16 DIAGNOSIS — L98498 Non-pressure chronic ulcer of skin of other sites with other specified severity: Secondary | ICD-10-CM | POA: Diagnosis not present

## 2018-08-16 DIAGNOSIS — F17210 Nicotine dependence, cigarettes, uncomplicated: Secondary | ICD-10-CM | POA: Diagnosis not present

## 2018-08-16 DIAGNOSIS — E11319 Type 2 diabetes mellitus with unspecified diabetic retinopathy without macular edema: Secondary | ICD-10-CM | POA: Insufficient documentation

## 2018-08-16 DIAGNOSIS — N186 End stage renal disease: Secondary | ICD-10-CM | POA: Diagnosis not present

## 2018-08-16 DIAGNOSIS — I10 Essential (primary) hypertension: Secondary | ICD-10-CM | POA: Insufficient documentation

## 2018-08-16 DIAGNOSIS — Z992 Dependence on renal dialysis: Secondary | ICD-10-CM | POA: Diagnosis not present

## 2018-08-16 DIAGNOSIS — I2510 Atherosclerotic heart disease of native coronary artery without angina pectoris: Secondary | ICD-10-CM | POA: Insufficient documentation

## 2018-08-16 DIAGNOSIS — E11622 Type 2 diabetes mellitus with other skin ulcer: Secondary | ICD-10-CM | POA: Diagnosis not present

## 2018-08-16 DIAGNOSIS — L03114 Cellulitis of left upper limb: Secondary | ICD-10-CM | POA: Diagnosis not present

## 2018-08-16 DIAGNOSIS — E1122 Type 2 diabetes mellitus with diabetic chronic kidney disease: Secondary | ICD-10-CM | POA: Insufficient documentation

## 2018-08-16 DIAGNOSIS — I120 Hypertensive chronic kidney disease with stage 5 chronic kidney disease or end stage renal disease: Secondary | ICD-10-CM | POA: Insufficient documentation

## 2018-08-16 DIAGNOSIS — M170 Bilateral primary osteoarthritis of knee: Secondary | ICD-10-CM | POA: Insufficient documentation

## 2018-08-16 DIAGNOSIS — E1140 Type 2 diabetes mellitus with diabetic neuropathy, unspecified: Secondary | ICD-10-CM | POA: Insufficient documentation

## 2018-08-16 DIAGNOSIS — E1151 Type 2 diabetes mellitus with diabetic peripheral angiopathy without gangrene: Secondary | ICD-10-CM | POA: Diagnosis not present

## 2018-08-16 DIAGNOSIS — L98499 Non-pressure chronic ulcer of skin of other sites with unspecified severity: Secondary | ICD-10-CM | POA: Diagnosis present

## 2018-08-16 NOTE — Telephone Encounter (Signed)
FYI  Patient is scheduled for MRI 7/17 with follow up visit 7/21 scheduled. Patient was seen by wound care center this morning and was being referred to vein and vascular - per patient.

## 2018-08-16 NOTE — Telephone Encounter (Signed)
Attempted to contact patient, voicemail is not set up.

## 2018-08-16 NOTE — Progress Notes (Signed)
ANIKKA, MARSAN ([id number]) Visit Report for 08/16/2018 Abuse/Suicide Risk Screen Details Patient Name: [name], [name] Date of Service: 08/16/2018 10:30 AM Medical Record Number: [id number] Patient Account Number: [id number] Date of Birth/Sex: 1966/01/27 (53 y.o. F) Treating RN: Harold Barban Primary Care Season Astacio: Sandi Mariscal Other Clinician: Referring Alyna Stensland: Frankey Shown Treating Zamzam Whinery/Extender: Tito Dine in Treatment: 0 Abuse/Suicide Risk Screen Items Answer ABUSE RISK SCREEN: Has anyone close to you tried to hurt or harm you recentlyo No Do you feel uncomfortable with anyone in your familyo No Has anyone forced you do things that you didnot want to doo No Electronic Signature(s) Signed: 08/16/2018 3:56:42 PM By: Harold Barban Entered By: Harold Barban on 08/16/2018 10:58:01 [name] ([id number]) -------------------------------------------------------------------------------- Activities of Daily Living Details Patient Name: [name] Date of Service: 08/16/2018 10:30 AM Medical Record Number: [id number] Patient Account Number: [id number] Date of Birth/Sex: 1965-08-19 (53 y.o. F) Treating RN: Harold Barban Primary Care Giovanne Nickolson: Sandi Mariscal Other Clinician: Referring Kerry Chisolm: Frankey Shown Treating Kaniya Trueheart/Extender: Tito Dine in Treatment: 0 Activities of Daily Living Items Answer Activities of Daily Living (Please select one for each item) Drive Automobile Not Able Take Medications Completely Able Use Telephone Completely Able Care for Appearance Completely Able Use Toilet Completely Able Bath / Shower Completely Able Dress Self Completely Able Feed Self Completely Able Walk Completely Able Get In / Out Bed Completely Able Housework Completely Able Prepare Meals Completely Able Handle Money Completely Able Shop for Self Completely Able Electronic Signature(s) Signed: 08/16/2018 3:56:42 PM By: Harold Barban Entered  By: Harold Barban on 08/16/2018 10:58:16 [name] ([id number]) -------------------------------------------------------------------------------- Education Screening Details Patient Name: [name] Date of Service: 08/16/2018 10:30 AM Medical Record Number: [id number] Patient Account Number: [id number] Date of Birth/Sex: Mar 20, 1965 (53 y.o. F) Treating RN: Harold Barban Primary Care Perseus Westall: Sandi Mariscal Other Clinician: Referring Marasia Newhall: Frankey Shown Treating Onyx Schirmer/Extender: Tito Dine in Treatment: 0 Primary Learner Assessed: Patient Learning Preferences/Education Level/Primary Language Learning Preference: Explanation Highest Education Level: College or Above Preferred Language: English Cognitive Barrier Language Barrier: No Translator Needed: No Memory Deficit: No Emotional Barrier: No Cultural/Religious Beliefs Affecting Medical Care: No Physical Barrier Impaired Vision: No Impaired Hearing: No Decreased Hand dexterity: No Knowledge/Comprehension Knowledge Level: High Comprehension Level: High Ability to understand written High instructions: Ability to understand verbal High instructions: Motivation Anxiety Level: Calm Cooperation: Cooperative Education Importance: Acknowledges Need Interest in Health Problems: Asks Questions Perception: Coherent Willingness to Engage in Self- High Management Activities: Readiness to Engage in Self- High Management Activities: Electronic Signature(s) Signed: 08/16/2018 3:56:42 PM By: Harold Barban Entered By: Harold Barban on 08/16/2018 10:58:47 [name] ([id number]) -------------------------------------------------------------------------------- Fall Risk Assessment Details Patient Name: [name] Date of Service: 08/16/2018 10:30 AM Medical Record Number: [id number] Patient Account Number: [id number] Date of Birth/Sex: 03-13-1965 (53 y.o. F) Treating RN: Harold Barban Primary Care Joban Colledge: Sandi Mariscal Other Clinician: Referring Sharnita Bogucki: Frankey Shown Treating Khoen Genet/Extender: Tito Dine in Treatment: 0 Fall Risk Assessment Items Have you had 2 or more falls in the last 12 monthso 0 Yes Have you had any fall that resulted in injury in the last 12 monthso 0 No FALLS RISK SCREEN History of falling - immediate or within 3 months 0 No Secondary diagnosis (Do you have 2 or more medical diagnoseso) 0 No Ambulatory aid None/bed rest/wheelchair/nurse 0 No Crutches/cane/walker 15 Yes Furniture 0 No Intravenous therapy Access/Saline/Heparin Lock 0 No Gait/Transferring Normal/ bed rest/ wheelchair 0 No Weak (short steps with or without shuffle, stooped but  able to lift head while 0 No walking, may seek support from furniture) Impaired (short steps with shuffle, may have difficulty arising from chair, head 20 Yes down, impaired balance) Mental Status Oriented to own ability 0 Yes Electronic Signature(s) Signed: 08/16/2018 3:56:42 PM By: Harold Barban Entered By: Harold Barban on 08/16/2018 10:59:27 [name] ([id number]) -------------------------------------------------------------------------------- Nutrition Risk Screening Details Patient Name: [name] Date of Service: 08/16/2018 10:30 AM Medical Record Number: [id number] Patient Account Number: [id number] Date of Birth/Sex: 1965/06/08 (53 y.o. F) Treating RN: Harold Barban Primary Care Jullian Previti: Sandi Mariscal Other Clinician: Referring Alonso Gapinski: Frankey Shown Treating Yago Ludvigsen/Extender: Tito Dine in Treatment: 0 Height (in): 68 Weight (lbs): 240 Body Mass Index (BMI): 36.5 Nutrition Risk Screening Items Score Screening NUTRITION RISK SCREEN: I have an illness or condition that made me change the kind and/or amount of 0 No food I eat I eat fewer than two meals per day 0 No I eat few fruits and vegetables, or milk products 0 No I have three or  more drinks of beer, liquor or wine almost every day 0 No I have tooth or mouth problems that make it hard for me to eat 0 No I don't always have enough money to buy the food I need 0 No I eat alone most of the time 0 No I take three or more different prescribed or over-the-counter drugs a day 1 Yes Without wanting to, I have lost or gained 10 pounds in the last six months 0 No I am not always physically able to shop, cook and/or feed myself 0 No Nutrition Protocols Good Risk Protocol Moderate Risk Protocol High Risk Proctocol Risk Level: Good Risk Score: 1 Electronic Signature(s) Signed: 08/16/2018 3:56:42 PM By: Harold Barban Entered By: Harold Barban on 08/16/2018 10:59:39

## 2018-08-16 NOTE — Telephone Encounter (Signed)
DONE

## 2018-08-16 NOTE — Telephone Encounter (Signed)
Wonderful, thanks!  

## 2018-08-16 NOTE — Telephone Encounter (Signed)
ok 

## 2018-08-16 NOTE — Telephone Encounter (Signed)
Spoke with Adela Lank from [facility] - wound care center, she will contact patient to schedule.

## 2018-08-16 NOTE — Telephone Encounter (Signed)
[name], patient is scheduled for Friday 08/18/18 at 715 pm at [facility]- address in referral scheduling notes.  Arrive at 7pm.  Can you please call her and tell her?  And make sure f/u appt is made with Erlinda Hong?   Thanks!

## 2018-08-17 NOTE — Progress Notes (Addendum)
[name], [name] ([id number]) Visit Report for 08/16/2018 Allergy List Details Patient Name: [name], [name] Date of Service: 08/16/2018 10:30 AM Medical Record Number: [id number] Patient Account Number: [id number] Date of Birth/Sex: [birth year] (53 y.o. F) Treating RN: Harold Barban Primary Care Ngozi Alvidrez: Sandi Mariscal Other Clinician: Referring Clotine Heiner: Frankey Shown Treating Shavonne Ambroise/Extender: Ricard Dillon Weeks in Treatment: 0 Allergies Active Allergies No Known Drug Allergies Allergy Notes Electronic Signature(s) Signed: 08/16/2018 3:56:42 PM By: Harold Barban Entered By: Harold Barban on 08/16/2018 10:53:56 [name] ([id number]) -------------------------------------------------------------------------------- Arrival Information Details Patient Name: [name] Date of Service: 08/16/2018 10:30 AM Medical Record Number: [id number] Patient Account Number: [id number] Date of Birth/Sex: 1966/01/24 (53 y.o. F) Treating RN: Harold Barban Primary Care Dawood Spitler: Sandi Mariscal Other Clinician: Referring Bliss Tsang: Frankey Shown Treating Neng Albee/Extender: Tito Dine in Treatment: 0 Visit Information Patient Arrived: Wheel Chair Arrival Time: 10:47 Accompanied By: son Transfer Assistance: None Patient Identification Verified: Yes Secondary Verification Process Completed: Yes Electronic Signature(s) Signed: 08/16/2018 3:56:42 PM By: Harold Barban Entered By: Harold Barban on 08/16/2018 10:48:43 [name] ([id number]) -------------------------------------------------------------------------------- Clinic Level of Care Assessment Details Patient Name: [name] Date of Service: 08/16/2018 10:30 AM Medical Record Number: [id number] Patient Account Number: [id number] Date of Birth/Sex: [birth year] (53 y.o. F) Treating RN: Cornell Barman Primary Care Gabriela Irigoyen: Sandi Mariscal Other Clinician: Referring Zenora Karpel: Frankey Shown Treating Severiano Utsey/Extender:  Tito Dine in Treatment: 0 Clinic Level of Care Assessment Items TOOL 2 Quantity Score []  - Use when only an EandM is performed on the INITIAL visit 0 ASSESSMENTS - Nursing Assessment / Reassessment X - General Physical Exam (combine w/ comprehensive assessment (listed just below) when 1 20 performed on new pt. evals) X- 1 25 Comprehensive Assessment (HX, ROS, Risk Assessments, Wounds Hx, etc.) ASSESSMENTS - Wound and Skin Assessment / Reassessment X - Simple Wound Assessment / Reassessment - one wound 1 5 []  - 0 Complex Wound Assessment / Reassessment - multiple wounds []  - 0 Dermatologic / Skin Assessment (not related to wound area) ASSESSMENTS - Ostomy and/or Continence Assessment and Care []  - Incontinence Assessment and Management 0 []  - 0 Ostomy Care Assessment and Management (repouching, etc.) PROCESS - Coordination of Care X - Simple Patient / Family Education for ongoing care 1 15 []  - 0 Complex (extensive) Patient / Family Education for ongoing care X- 1 10 Staff obtains Programmer, systems, Records, Test Results / Process Orders []  - 0 Staff telephones HHA, Nursing Homes / Clarify orders / etc []  - 0 Routine Transfer to another Facility (non-emergent condition) []  - 0 Routine Hospital Admission (non-emergent condition) X- 1 15 New Admissions / Biomedical engineer / Ordering NPWT, Apligraf, etc. []  - 0 Emergency Hospital Admission (emergent condition) X- 1 10 Simple Discharge Coordination []  - 0 Complex (extensive) Discharge Coordination PROCESS - Special Needs []  - Pediatric / Minor Patient Management 0 []  - 0 Isolation Patient Management [name], [name] ([id number]) []  - 0 Hearing / Language / Visual special needs []  - 0 Assessment of Community assistance (transportation, D/C planning, etc.) []  - 0 Additional assistance / Altered mentation []  - 0 Support Surface(s) Assessment (bed, cushion, seat, etc.) INTERVENTIONS - Wound Cleansing /  Measurement X - Wound Imaging (photographs - any number of wounds) 1 5 []  - 0 Wound Tracing (instead of photographs) X- 1 5 Simple Wound Measurement - one wound []  - 0 Complex Wound Measurement - multiple wounds X- 1 5 Simple Wound Cleansing - one wound []  - 0 Complex Wound Cleansing - multiple wounds INTERVENTIONS -  Wound Dressings []  - Small Wound Dressing one or multiple wounds 0 X- 1 15 Medium Wound Dressing one or multiple wounds []  - 0 Large Wound Dressing one or multiple wounds []  - 0 Application of Medications - injection INTERVENTIONS - Miscellaneous []  - External ear exam 0 []  - 0 Specimen Collection (cultures, biopsies, blood, body fluids, etc.) []  - 0 Specimen(s) / Culture(s) sent or taken to Lab for analysis []  - 0 Patient Transfer (multiple staff / Harrel Lemon Lift / Similar devices) []  - 0 Simple Staple / Suture removal (25 or less) []  - 0 Complex Staple / Suture removal (26 or more) []  - 0 Hypo / Hyperglycemic Management (close monitor of Blood Glucose) []  - 0 Ankle / Brachial Index (ABI) - do not check if billed separately Has the patient been seen at the hospital within the last three years: Yes Total Score: 130 Level Of Care: New/Established - Level 4 Electronic Signature(s) Signed: 08/16/2018 4:20:22 PM By: Gretta Cool, BSN, RN, CWS, Kim RN, BSN Entered By: Gretta Cool, BSN, RN, CWS, Kim on 08/16/2018 11:31:42 [name] ([id number]) -------------------------------------------------------------------------------- Lower Extremity Assessment Details Patient Name: [name] Date of Service: 08/16/2018 10:30 AM Medical Record Number: [id number] Patient Account Number: [id number] Date of Birth/Sex: 02-26-1965 (53 y.o. F) Treating RN: Harold Barban Primary Care Sandi Towe: Sandi Mariscal Other Clinician: Referring Salbador Fiveash: Frankey Shown Treating French Kendra/Extender: Ricard Dillon Weeks in Treatment: 0 Electronic Signature(s) Signed: 08/16/2018 3:56:42 PM By:  Harold Barban Entered By: Harold Barban on 08/16/2018 10:53:44 [name] ([id number]) -------------------------------------------------------------------------------- Multi Wound Chart Details Patient Name: [name] Date of Service: 08/16/2018 10:30 AM Medical Record Number: [id number] Patient Account Number: [id number] Date of Birth/Sex: Aug 12, 1965 (53 y.o. F) Treating RN: Cornell Barman Primary Care Brenee Gajda: Sandi Mariscal Other Clinician: Referring Angenette Daily: Frankey Shown Treating Vidhi Delellis/Extender: Tito Dine in Treatment: 0 Vital Signs Height(in): 68 Pulse(bpm): 77 Weight(lbs): 240 Blood Pressure(mmHg): 105/64 Body Mass Index(BMI): 36 Temperature(F): 98.4 Respiratory Rate 18 (breaths/min): Photos: [N/A:N/A] Wound Location: Left Hand - 1st Digit - N/A N/A Circumferential Wounding Event: Blister N/A N/A Primary Etiology: Trauma, Other N/A N/A Comorbid History: Anemia, Arrhythmia, Coronary N/A N/A Artery Disease, Peripheral Venous Disease, Type II Diabetes, End Stage Renal Disease, Rheumatoid Arthritis, Neuropathy Date Acquired: 08/10/2018 N/A N/A Weeks of Treatment: 0 N/A N/A Wound Status: Open N/A N/A Measurements L x W x D 3.5x8.5x0.1 N/A N/A (cm) Area (cm) : 23.366 N/A N/A Volume (cm) : 2.337 N/A N/A Classification: Full Thickness Without N/A N/A Exposed Support Structures Exudate Amount: Large N/A N/A Exudate Type: Serosanguineous N/A N/A Exudate Color: red, brown N/A N/A Granulation Amount: Small (1-33%) N/A N/A Granulation Quality: Pink N/A N/A Necrotic Amount: Medium (34-66%) N/A N/A Necrotic Tissue: Eschar, Adherent Slough N/A N/A Exposed Structures: Fat Layer (Subcutaneous N/A N/A Tissue) Exposed: Yes Fascia: No [name], [name] ([id number]) Tendon: No Muscle: No Joint: No Bone: No Epithelialization: None N/A N/A Debridement: Chemical/Enzymatic/Mechanical N/A N/A Pre-procedure 11:25 N/A N/A Verification/Time Out  Taken: Pain Control: Lidocaine N/A N/A Instrument: Other(gauze and saline) N/A N/A Bleeding: Minimum N/A N/A Hemostasis Achieved: Pressure N/A N/A Debridement Treatment Procedure was tolerated well N/A N/A Response: Post Debridement 3.5x8.5x0.1 N/A N/A Measurements L x W x D (cm) Post Debridement Volume: 2.337 N/A N/A (cm) Procedures Performed: Debridement N/A N/A Treatment Notes Electronic Signature(s) Signed: 08/16/2018 4:31:45 PM By: Linton Ham MD Entered By: Linton Ham on 08/16/2018 12:19:50 [name] ([id number]) -------------------------------------------------------------------------------- Multi-Disciplinary Care Plan Details Patient Name: [name] Date of Service: 08/16/2018 10:30 AM Medical Record Number:  [id number] Patient Account Number: [id number] Date of Birth/Sex: 08-Oct-1965 (53 y.o. F) Treating RN: Cornell Barman Primary Care Shona Pardo: Sandi Mariscal Other Clinician: Referring Spiros Greenfeld: Frankey Shown Treating Arnetia Bronk/Extender: Tito Dine in Treatment: 0 Active Inactive Electronic Signature(s) Signed: 09/07/2018 8:05:19 AM By: Gretta Cool, BSN, RN, CWS, Kim RN, BSN Previous Signature: 08/16/2018 4:20:22 PM Version By: Gretta Cool BSN, RN, CWS, Kim RN, BSN Entered By: Gretta Cool, BSN, RN, CWS, Kim on 09/07/2018 08:05:19 [name] ([id number]) -------------------------------------------------------------------------------- Pain Assessment Details Patient Name: [name] Date of Service: 08/16/2018 10:30 AM Medical Record Number: [id number] Patient Account Number: [id number] Date of Birth/Sex: 17-Apr-1965 (53 y.o. F) Treating RN: Harold Barban Primary Care Atziry Baranski: Sandi Mariscal Other Clinician: Referring Kamari Buch: Frankey Shown Treating Dimitria Ketchum/Extender: Tito Dine in Treatment: 0 Active Problems Location of Pain Severity and Description of Pain Patient Has Paino No Site Locations Pain Management and Medication Current Pain  Management: Electronic Signature(s) Signed: 08/16/2018 3:56:42 PM By: Harold Barban Entered By: Harold Barban on 08/16/2018 10:48:54 [name] ([id number]) -------------------------------------------------------------------------------- Patient/Caregiver Education Details Patient Name: [name] Date of Service: 08/16/2018 10:30 AM Medical Record Number: [id number] Patient Account Number: [id number] Date of Birth/Gender: [birth year] (53 y.o. F) Treating RN: Cornell Barman Primary Care Physician: Sandi Mariscal Other Clinician: Referring Physician: Frankey Shown Treating Physician/Extender: Tito Dine in Treatment: 0 Education Assessment Education Provided To: Patient Education Topics Provided Infection: Handouts: Infection Prevention and Management Methods: Demonstration, Explain/Verbal Responses: State content correctly Wound/Skin Impairment: Handouts: Caring for Your Ulcer Methods: Demonstration, Explain/Verbal Responses: State content correctly Electronic Signature(s) Signed: 08/16/2018 4:20:22 PM By: Gretta Cool, BSN, RN, CWS, Kim RN, BSN Entered By: Gretta Cool, BSN, RN, CWS, Kim on 08/16/2018 11:32:10 [name] ([id number]) -------------------------------------------------------------------------------- Wound Assessment Details Patient Name: [name] Date of Service: 08/16/2018 10:30 AM Medical Record Number: [id number] Patient Account Number: [id number] Date of Birth/Sex: 11-10-1965 (53 y.o. F) Treating RN: Harold Barban Primary Care Kaeden Depaz: Sandi Mariscal Other Clinician: Referring Lisia Westbay: Frankey Shown Treating Deontrae Drinkard/Extender: Tito Dine in Treatment: 0 Wound Status Wound Number: 1 Primary Trauma, Other Etiology: Wound Location: Left Hand - 1st Digit - Circumferential Wound Open Wounding Event: Blister Status: Date Acquired: 08/10/2018 Comorbid Anemia, Arrhythmia, Coronary Artery Disease, Weeks Of Treatment: 0 History: Peripheral  Venous Disease, Type II Diabetes, Clustered Wound: No End Stage Renal Disease, Rheumatoid Arthritis, Neuropathy Photos Wound Measurements Length: (cm) 3.5 Width: (cm) 8.5 Depth: (cm) 0.1 Area: (cm) 23.366 Volume: (cm) 2.337 % Reduction in Area: % Reduction in Volume: Epithelialization: None Tunneling: No Undermining: No Wound Description Full Thickness Without Exposed Support Classification: Structures Exudate Large Amount: Exudate Type: Serosanguineous Exudate Color: red, brown Foul Odor After Cleansing: No Slough/Fibrino Yes Wound Bed Granulation Amount: Small (1-33%) Exposed Structure Granulation Quality: Pink Fascia Exposed: No Necrotic Amount: Medium (34-66%) Fat Layer (Subcutaneous Tissue) Exposed: Yes Necrotic Quality: Eschar, Adherent Slough Tendon Exposed: No Muscle Exposed: No Joint Exposed: No Bone Exposed: No [name], [name] ([id number]) Electronic Signature(s) Signed: 08/16/2018 3:56:42 PM By: Harold Barban Entered By: Harold Barban on 08/16/2018 11:06:22 [name] ([id number]) -------------------------------------------------------------------------------- Vitals Details Patient Name: [name] Date of Service: 08/16/2018 10:30 AM Medical Record Number: [id number] Patient Account Number: [id number] Date of Birth/Sex: 08-29-1965 (53 y.o. F) Treating RN: Harold Barban Primary Care Delphia Kaylor: Sandi Mariscal Other Clinician: Referring Mikko Lewellen: Frankey Shown Treating Davontae Prusinski/Extender: Tito Dine in Treatment: 0 Vital Signs Time Taken: 10:30 Temperature (F): 98.4 Height (in): 68 Pulse (bpm): 77 Source: Stated Respiratory Rate (breaths/min): 18 Weight (lbs): 240 Blood Pressure (mmHg): 105/64 Source:  Stated Reference Range: 80 - 120 mg / dl Body Mass Index (BMI): 36.5 Electronic Signature(s) Signed: 08/16/2018 3:56:42 PM By: Harold Barban Entered By: Harold Barban on 08/16/2018 10:53:34

## 2018-08-17 NOTE — Progress Notes (Signed)
CORAYMA, CASHATT ([id number]) Visit Report for 08/16/2018 Chief Complaint Document Details Patient Name: [name], [name] Date of Service: 08/16/2018 10:30 AM Medical Record Number: [id number] Patient Account Number: [id number] Date of Birth/Sex: 07/08/1965 (53 y.o. F) Treating RN: Cornell Barman Primary Care Provider: Sandi Mariscal Other Clinician: Referring Provider: Frankey Shown Treating Provider/Extender: Tito Dine in Treatment: 0 Information Obtained from: Patient Chief Complaint 08/16/2018; patient is here for review of a recent wound development on the left thumb. Electronic Signature(s) Signed: 08/16/2018 4:31:45 PM By: Linton Ham MD Entered By: Linton Ham on 08/16/2018 12:24:43 [name] ([id number]) -------------------------------------------------------------------------------- Debridement Details Patient Name: [name] Date of Service: 08/16/2018 10:30 AM Medical Record Number: [id number] Patient Account Number: [id number] Date of Birth/Sex: April 17, 1965 (53 y.o. F) Treating RN: Cornell Barman Primary Care Provider: Sandi Mariscal Other Clinician: Referring Provider: Frankey Shown Treating Provider/Extender: Tito Dine in Treatment: 0 Debridement Performed for Wound #1 Left,Circumferential Hand - 1st Digit Assessment: Performed By: Physician Ricard Dillon, MD Debridement Type: Chemical/Enzymatic/Mechanical Agent Used: Gauze and saline Level of Consciousness (Pre- Awake and Alert procedure): Pre-procedure Verification/Time Yes - 11:25 Out Taken: Start Time: 11:25 Pain Control: Lidocaine Instrument: Other : gauze and saline Bleeding: Minimum Hemostasis Achieved: Pressure End Time: 11:30 Response to Treatment: Procedure was tolerated well Level of Consciousness Awake and Alert (Post-procedure): Post Debridement Measurements of Total Wound Length: (cm) 3.5 Width: (cm) 8.5 Depth: (cm) 0.1 Volume: (cm) 2.337 Character of  Wound/Ulcer Post Debridement: Stable Post Procedure Diagnosis Same as Pre-procedure Electronic Signature(s) Signed: 08/16/2018 4:20:22 PM By: Gretta Cool, BSN, RN, CWS, Kim RN, BSN Signed: 08/16/2018 4:31:45 PM By: Linton Ham MD Entered By: Gretta Cool, BSN, RN, CWS, Kim on 08/16/2018 11:30:30 [name] ([id number]) -------------------------------------------------------------------------------- HPI Details Patient Name: [name] Date of Service: 08/16/2018 10:30 AM Medical Record Number: [id number] Patient Account Number: [id number] Date of Birth/Sex: [birth year] (53 y.o. F) Treating RN: Cornell Barman Primary Care Provider: Sandi Mariscal Other Clinician: Referring Provider: Frankey Shown Treating Provider/Extender: Tito Dine in Treatment: 0 History of Present Illness HPI Description: ADMISSION 08/16/2018 This is a 53 year old woman with type 2 diabetes on dialysis. She dialyzes in [place] through a shunt in her left arm. The shunt was created in [facility]. Other relevant history is that she had a history of wounds in her lower extremities which ultimately healed at the wound care center in [place]. She apparently has a shunt in her right leg. She credits this revascularization with saving her leg. At that time she had wounds on both legs Acutely last week she developed pain and purulence at the nail cuticle on the right thumb. She was seen in the ER on 7/10. Area was anesthetized debrided. Culture grew MSSA. X-ray showed no osteomyelitis but soft tissue gas. She was seen back in the ER on 7/12 started on Augmentin. She was seen twice also by orthopedics on 7/13 [Dr. Hilts] and 7/14 [Dr. Erlinda Hong. Dr. Erlinda Hong not feel she had osteomyelitis but is ordered an MRI. He continued her Augmentin. She has lost a considerable amount of tissue on the dorsal aspect of her thumb presumably as a result of significant cellulitis. She has been using wet-to-dry dressings. Past  medical history type 2 diabetes on dialysis, chronic neck and lower back pain, patient states rheumatoid arthritis on Enbrel but I do not see this on her problem list, hypertension, diastolic dysfunction, diabetic retinopathy, left ventricular hypertrophy, coronary artery disease nonobstructive. 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 F) Treating RN: Cornell Barman Primary Care Provider: Sandi Mariscal Other Clinician: Referring Provider: Frankey Shown Treating Provider/Extender: Ricard Dillon Weeks in Treatment: 0 Constitutional Sitting or standing Blood Pressure is within target range for patient.. Pulse regular and within target range for patient.Marland Kitchen Respirations regular, non-labored and within target range.. Temperature is normal and within the target range for the patient.Marland Kitchen appears in no distress. Eyes Conjunctivae clear. No discharge. Respiratory Respiratory effort is easy and symmetric bilaterally. Rate is normal at rest and on room air.. Bilateral breath sounds are clear and equal in all lobes with no wheezes, rales or rhonchi.. Cardiovascular Heart rhythm and rate regular, without murmur or gallop.. She has a brachial pulse but no radial pulse on the left. Integumentary (Hair, Skin) Beside the extensive skin loss on the dorsal aspect of her left thumb. She has what looks to be a small crater on the third PIP. Not a completely viable surface. She has had other wounds on the right hand that appears to a fold held over.  The skin in the distal aspect of her fingers does not look particularly viable on multiple digits. She has multiple nodules on her skin especially on the anterior thighs bilaterally. 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 Psychiatric No evidence of depression, anxiety, or agitation. Calm, cooperative, and communicative. Appropriate interactions and affect.. Notes Wound exam; the area in question is on the dorsal aspect of her left thumb. She has lost the nail and a considerable amount of skin loss. The tip of the thumb does not look particularly viable to me dark and nonviable. The rest of this looks marginal. Electronic Signature(s) Signed: 08/16/2018 4:31:45 PM By: Linton Ham MD Entered By: Linton Ham on 08/16/2018 12:34:01 [name] ([id number]) -------------------------------------------------------------------------------- Physician Orders Details Patient Name: [name] Date of Service: 08/16/2018 10:30 AM Medical Record Number: [id number] Patient Account Number: [id number] Date of Birth/Sex: [birth year] (53 y.o. F) Treating RN: Cornell Barman Primary Care Provider: Sandi Mariscal Other Clinician: Referring Provider: Frankey Shown Treating Provider/Extender: Tito Dine in Treatment: 0 Verbal / Phone Orders: No Diagnosis Coding Wound Cleansing Wound #1 Left,Circumferential Hand - 1st Digit o Clean wound with Normal Saline. o Cleanse wound with mild soap and water Anesthetic (add to Medication List) Wound #1 Left,Circumferential Hand - 1st Digit o Topical Lidocaine 4% cream applied to wound bed prior to debridement (In Clinic Only). Primary Wound Dressing Wound #1 Left,Circumferential Hand - 1st Digit o Silver Alginate Secondary Dressing Wound #1 Left,Circumferential Hand - 1st Digit o Gauze and Kerlix/Conform Dressing Change Frequency Wound #1 Left,Circumferential Hand - 1st Digit o Change dressing every other day. Follow-up Appointments Wound  #1 Left,Circumferential Hand - 1st Digit o Return Appointment in 1 week. Consults o Vascular - Shunt in left arm, blood flow to [employer]) Signed: 08/16/2018 4:20:22 PM By: Gretta Cool, BSN, RN, CWS, Kim RN, BSN Signed: 08/16/2018 4:31:45 PM By: Linton Ham MD Entered By: Gretta Cool, BSN, RN, CWS, Kim on 08/16/2018 11:29:26 [name] ([id number]) -------------------------------------------------------------------------------- Problem List Details Patient Name: [name] Date of Service: 08/16/2018 10:30 AM Medical Record Number: [id number] Patient Account Number: [id number] Date of Birth/Sex: Nov 04, 1965 (53 y.o. 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 with other 08/16/2018 No Yes specified severity L03.114 Cellulitis of left upper limb 08/16/2018 No Yes E11.51 Type 2 diabetes mellitus with diabetic peripheral angiopathy 08/16/2018 No Yes without gangrene E11.622 Type 2 diabetes mellitus with other skin ulcer 08/16/2018 No Yes M05.80 Other rheumatoid arthritis with rheumatoid factor of 08/16/2018 No Yes unspecified site Inactive Problems Resolved Problems Electronic Signature(s) Signed: 08/16/2018 4:31:45 PM By: Linton Ham MD Entered By: Linton Ham on 08/16/2018 12:18:25 [name] ([id number]) -------------------------------------------------------------------------------- Progress Note Details Patient Name: [name] Date of Service: 08/16/2018 10:30 AM Medical Record Number: [id number] Patient Account Number: [id number] Date of Birth/Sex: [birth year] (53 y.o. F) Treating RN: Cornell Barman Primary Care Provider: Sandi Mariscal Other Clinician: Referring Provider:  Frankey Shown Treating Provider/Extender: Tito Dine in Treatment: 0 Subjective Chief Complaint Information obtained from Patient 08/16/2018; patient is here for review of a recent wound development on the left thumb. History of Present Illness (HPI) ADMISSION 08/16/2018 This is a 53 year old woman with type 2 diabetes on dialysis. She dialyzes in [place] through a shunt in her left arm. The shunt was created in [facility]. Other relevant history is that she had a history of wounds in her lower extremities which ultimately healed at the wound care center in [place]. She apparently has a shunt in her right leg. She credits this revascularization with saving her leg. At that time she had wounds on both legs Acutely last week she developed pain and purulence at the nail cuticle on the right thumb. She was seen in the ER on 7/10. Area was anesthetized debrided. Culture grew MSSA. X-ray showed no osteomyelitis but soft tissue gas. She was seen back in the ER on 7/12 started on Augmentin. She was seen twice also by orthopedics on 7/13 [Dr. Hilts] and 7/14 [Dr. Erlinda Hong. Dr. Erlinda Hong not feel she had osteomyelitis but is ordered an MRI. He continued her Augmentin. She has lost a considerable amount of tissue on the dorsal aspect of her thumb presumably as a result of significant cellulitis. She has been using wet-to-dry dressings. 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 Allergies No Known Drug Allergies Family History Cancer - Siblings, Diabetes - Father,Mother,Siblings, Heart  Disease - Father, Hypertension - Father,Mother,Siblings, Stroke - Father,Mother, No family history of Hereditary Spherocytosis, Thyroid Problems, Tuberculosis. Social History Current every day smoker - 10 years, Marital Status - Married, Alcohol Use - Never, Drug Use - No History, Caffeine Use - Moderate. 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 Gastrointestinal Denies history of Cirrhosis , Colitis, Crohn s, Hepatitis A, Hepatitis B, Hepatitis  C Endocrine Patient has history of Type II Diabetes Denies history of Type I Diabetes Genitourinary Patient has history of End Stage Renal Disease Immunological Denies history of Lupus Erythematosus, Raynaud s, Scleroderma Integumentary (Skin) Denies history of History of Burn, History of pressure wounds Musculoskeletal Patient has history of Rheumatoid Arthritis - hands Denies history of Gout, Osteoarthritis, Osteomyelitis Neurologic Patient has history of Neuropathy Denies history of Dementia, Quadriplegia, Paraplegia, Seizure Disorder Oncologic Denies history of Received Chemotherapy, Received Radiation Psychiatric Denies history of Anorexia/bulimia, Confinement Anxiety Patient is treated with Insulin. Blood sugar is not tested. Hospitalization/Surgery History - C Section 1996/1997. - Pericardial Window (effusion)-2006. - Tubal Ligation-1997. Medical And  Surgical History Notes Cardiovascular Pericardial effusion Endocrine ESRD Musculoskeletal Arthritis knees Review of Systems (ROS) Constitutional Symptoms (General Health) Denies complaints or symptoms of Fatigue, Fever, Chills, Marked Weight Change. Eyes Denies complaints or symptoms of Dry Eyes, Vision Changes, Glasses / Contacts, Diabetic Retinopathy Ear/Nose/Mouth/Throat Denies complaints or symptoms of Difficult clearing ears, Sinusitis. Hematologic/Lymphatic Denies complaints or symptoms of Bleeding / Clotting Disorders, Human Immunodeficiency Virus. Respiratory Denies complaints or symptoms of Chronic or frequent coughs, Shortness of Breath. Cardiovascular Denies complaints or symptoms of Chest pain, LE edema. Gastrointestinal Denies complaints or symptoms of Frequent diarrhea, Nausea, Vomiting. Endocrine PERCILLA, TWETEN ([id number]) Denies complaints or symptoms of Hepatitis, Thyroid disease, Polydypsia (Excessive Thirst), High CHO, Hyperlipidemia Genitourinary Complains or has symptoms of Kidney failure/ Dialysis. Denies complaints or symptoms of Incontinence/dribbling. Immunological Denies complaints or symptoms of Hives, Itching. Integumentary (Skin) Complains or has symptoms of Wounds - thumb, Swelling. Denies complaints or symptoms of Bleeding or bruising tendency, Breakdown. Musculoskeletal Complains or has symptoms of Muscle Pain - Left arm and hip, neck. Denies complaints or symptoms of Muscle Weakness. Neurologic Complains or has symptoms of Numbness/parasthesias - legs and hands. Psychiatric Denies complaints or symptoms of Anxiety, Claustrophobia. Objective Constitutional Sitting or standing Blood Pressure is within target range for patient.. Pulse regular and within target range for patient.Marland Kitchen Respirations regular, non-labored and within target range.. Temperature is normal and within the target range for the patient.Marland Kitchen appears in no distress. Vitals  Time Taken: 10:30 AM, Height: 68 in, Source: Stated, Weight: 240 lbs, Source: Stated, BMI: 36.5, Temperature: 98.4  F, Pulse: 77 bpm, Respiratory Rate: 18 breaths/min, Blood Pressure: 105/64 mmHg. Eyes Conjunctivae clear. No discharge. Respiratory Respiratory effort is easy and symmetric bilaterally. Rate is normal at rest and on room air.. Bilateral breath sounds are clear and equal in all lobes with no wheezes, rales or rhonchi.. Cardiovascular Heart rhythm and rate regular, without murmur or gallop.. She has a brachial pulse but no radial pulse on the left. Psychiatric No evidence of depression, anxiety, or agitation. Calm, cooperative, and communicative. Appropriate interactions and affect.. General Notes: Wound exam; the area in question is on the dorsal aspect of her left thumb. She has lost the nail and a considerable amount of skin loss. The tip of the thumb does not look particularly viable to me dark and nonviable. The rest of this looks marginal. Integumentary (Hair, Skin) Beside the extensive skin loss on the dorsal aspect of her left thumb. She has what looks to be a small crater on the third PIP. Not a completely viable surface. She has had other wounds on the right hand that appears to a fold held over. The skin in the distal aspect of her fingers does not look particularly viable on multiple digits. She has multiple nodules on her skin especially on the anterior thighs bilaterally.  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 RAYNE, COWDREY ([id number]) Wound #1 status is Open. Original cause of wound was Blister. The wound is located on the Left,Circumferential Hand - 1st Digit. The wound measures 3.5cm length x 8.5cm width x 0.1cm depth; 23.366cm^2 area and 2.337cm^3 volume. There is Fat Layer (Subcutaneous Tissue) Exposed exposed. There is no tunneling or undermining noted. There is a large amount of serosanguineous drainage noted. There is small (1-33%) pink granulation within the  wound bed. There is a medium (34-66%) amount of necrotic tissue within the wound bed including Eschar and Adherent Slough. Assessment Active Problems ICD-10 Non-pressure chronic ulcer of skin of other sites with other specified severity Cellulitis of left upper limb Type 2 diabetes mellitus with diabetic peripheral angiopathy without gangrene Type 2 diabetes mellitus with other skin ulcer Other rheumatoid arthritis with rheumatoid factor of unspecified site Procedures Wound #1 Pre-procedure diagnosis of Wound #1 is a Trauma, Other located on the Left,Circumferential Hand - 1st Digit . There was a Chemical/Enzymatic/Mechanical debridement performed by Ricard Dillon, MD. With the following instrument(s): gauze and saline after achieving pain control using Lidocaine. Other agent used was Gauze and saline. A time out was conducted at 11:25, prior to the start of the procedure. A Minimum amount of bleeding was controlled with Pressure. The procedure was tolerated well. Post Debridement Measurements: 3.5cm length x 8.5cm width x 0.1cm depth; 2.337cm^3 volume. Character of Wound/Ulcer Post Debridement is stable. Post procedure Diagnosis Wound #1: Same as Pre-Procedure Plan Wound Cleansing: Wound #1 Left,Circumferential Hand - 1st Digit: Clean wound with Normal Saline. Cleanse wound with mild soap and water Anesthetic (add to Medication List): Wound #1 Left,Circumferential Hand - 1st Digit: Topical Lidocaine 4% cream applied to wound bed prior to debridement (In Clinic Only). Primary Wound Dressing: Wound #1 Left,Circumferential Hand - 1st Digit: Silver Alginate Secondary Dressing: Wound #1 Left,Circumferential Hand - 1st Digit: Gauze and Kerlix/Conform Jupiter, Tanaisha ([id number]) Dressing Change Frequency: Wound #1 Left,Circumferential Hand - 1st Digit: Change dressing every other day. Follow-up Appointments: Wound #1 Left,Circumferential Hand - 1st Digit: Return Appointment in  1 week. Consults ordered were: Vascular - Shunt in left arm, blood flow to hando 1. Patient has extensive skin loss from her left thumb. No doubt this was due to spreading infection cultured MSSA from the cuticle of her nail she is still on Augmentin which should be satisfactory. 2. Dr. Erlinda Hong did not believe that she had osteomyelitis based on the acuity of this but he did order an MRI. He also continued the Augmentin that she is still taking 3. The patient's left and right hands especially at the fingertips looks somewhat unusual to me. Dark somewhat dusky. They are not cold and she is not complaining of pain but nevertheless I wonder about the blood flow to her hands. On the left I cannot feel a radial pulse. Her shunt is in the left arm and I wonder if she is had a steal from this contributing to this or whether she has atherosclerosis perhaps even Buerger's disease. I am going to send her to vascular surgery to look at the blood supply to the left hand. 4. She is a continued cigarette smoker at a half a pack per day. I have aggressively told her that this needs to stop. I referenced Buerger's disease although I am not at all sure this is what she has. 5. She also also multiple cutaneous nodules particularly on the anterior aspect of both thighs her left arm. She says she  has kyrles disease. This was diagnosed in [place]. I do not think that this is contributing to the problem in her left hand Electronic Signature(s) Signed: 08/16/2018 12:42:59 PM By: Linton Ham MD Entered By: Linton Ham on 08/16/2018 12:42:58 [name] ([id number]) -------------------------------------------------------------------------------- ROS/PFSH Details Patient Name: [name] Date of Service: 08/16/2018 10:30 AM Medical Record Number: [id number] Patient Account Number: [id number] Date of Birth/Sex: 05-03-1965 (53 y.o. F) Treating RN: Harold Barban Primary Care Provider: Sandi Mariscal Other  Clinician: Referring Provider: Frankey Shown Treating Provider/Extender: Tito Dine in Treatment: 0 Information Obtained From Patient Constitutional Symptoms (General Health) Complaints and Symptoms: Negative for: Fatigue; Fever; Chills; Marked Weight Change Eyes Complaints and Symptoms: Negative for: Dry Eyes; Vision Changes; Glasses / Contacts Review of System Notes: Diabetic Retinopathy Medical History: Negative for: Cataracts; Glaucoma; Optic Neuritis Ear/Nose/Mouth/Throat Complaints and Symptoms: Negative for: Difficult clearing ears; Sinusitis Medical History: Negative for: Chronic sinus problems/congestion; Middle ear problems Hematologic/Lymphatic Complaints and Symptoms: Negative for: Bleeding / Clotting Disorders; Human Immunodeficiency Virus Medical History: Positive for: Anemia Negative for: Hemophilia; Human Immunodeficiency Virus; Lymphedema; Sickle Cell Disease Respiratory Complaints and Symptoms: Negative for: Chronic or frequent coughs; Shortness of Breath Medical History: Negative for: Aspiration; Asthma; Chronic Obstructive Pulmonary Disease (COPD); Pneumothorax; Sleep Apnea; Tuberculosis Cardiovascular Complaints and Symptoms: Negative for: Chest pain; LE edema Medical History: Positive for: Arrhythmia - Diastolic Dysfunction; Coronary Artery Disease; 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 KHIRA, CUDMORE ([id number]) Past Medical History Notes: Pericardial effusion Gastrointestinal Complaints and Symptoms: Negative for: Frequent diarrhea; Nausea; Vomiting Medical History: Negative for: Cirrhosis ; Colitis; Crohnos; Hepatitis A; Hepatitis B; Hepatitis C Endocrine Complaints and Symptoms: Negative for: Hepatitis; Thyroid disease; Polydypsia (Excessive Thirst) Review of System Notes: High CHO, Hyperlipidemia Medical History: Positive for: Type II Diabetes Negative for: Type I Diabetes Past Medical History Notes: ESRD Time with  diabetes: 20 years Treated with: Insulin Blood sugar tested every day: No Genitourinary Complaints and Symptoms: Positive for: Kidney failure/ Dialysis Negative for: Incontinence/dribbling Medical History: Positive for: End Stage Renal Disease Immunological Complaints and Symptoms: Negative for: Hives; Itching Medical History: Negative for: Lupus Erythematosus; Raynaudos; Scleroderma Integumentary (Skin) Complaints and Symptoms: Positive for: Wounds - thumb; Swelling Negative for: Bleeding or bruising tendency; Breakdown Medical History: Negative for: History of Burn; History of pressure wounds Musculoskeletal Complaints and Symptoms: Positive for: Muscle Pain - Left arm and hip, neck Negative for: Muscle Weakness PEDRO, OLDENBURG ([id number]) Medical History: Positive for: Rheumatoid Arthritis - hands Negative for: Gout; Osteoarthritis; Osteomyelitis Past Medical History Notes: Arthritis knees Neurologic Complaints and Symptoms: Positive for: Numbness/parasthesias - legs and hands Medical History: Positive for: Neuropathy Negative for: Dementia; Quadriplegia; Paraplegia; Seizure Disorder Psychiatric Complaints and Symptoms: Negative for: Anxiety; Claustrophobia Medical History: Negative for: Anorexia/bulimia; Confinement Anxiety Oncologic Medical History: Negative for: Received Chemotherapy; Received Radiation Immunizations Pneumococcal Vaccine: Received Pneumococcal Vaccination: Yes Implantable Devices None Hospitalization / Surgery History Type of Hospitalization/Surgery C Section 1996/1997 Pericardial Window (effusion)-2006 Tubal Ligation-1997 Family and Social History Cancer: Yes - Siblings; Diabetes: Yes - Father,Mother,Siblings; Heart Disease: Yes - Father; Hereditary Spherocytosis: No; Hypertension: Yes - Father,Mother,Siblings; Stroke: Yes - Father,Mother; Thyroid Problems: No; Tuberculosis: No; Current every day smoker - 10 years; Marital Status -  Married; Alcohol Use: Never; Drug Use: No History; Caffeine Use: Moderate; Financial Concerns: No; Food, Clothing or Shelter Needs: No; Support System Lacking: No; Transportation Concerns: No Electronic Signature(s) Signed: 08/16/2018 3:56:42 PM By: Harold Barban Signed: 08/16/2018 4:31:45 PM By: Linton Ham MD Entered By: Harold Barban on  08/16/2018 10:57:20 ELLYCE, LAFEVERS ([id number]) -------------------------------------------------------------------------------- SuperBill Details Patient Name: [name] Date of Service: 08/16/2018 Medical Record Number: [id number] Patient Account Number: [id number] Date of Birth/Sex: [birth year] (53 y.o. F) Treating RN: Cornell Barman Primary Care Provider: Sandi Mariscal Other Clinician: Referring Provider: Frankey Shown Treating Provider/Extender: Ricard Dillon Weeks in Treatment: 0 Diagnosis Coding ICD-10 Codes Code Description L98.498 Non-pressure chronic ulcer of skin of other sites with other specified severity L03.114 Cellulitis of left upper limb E11.51 Type 2 diabetes mellitus with diabetic peripheral angiopathy without gangrene E11.622 Type 2 diabetes mellitus with other skin ulcer M05.80 Other rheumatoid arthritis with rheumatoid factor of unspecified site Facility Procedures CPT4 Code: 13086578 Description: 99214 - WOUND CARE VISIT-LEV 4 EST PT Modifier: Quantity: 1 Physician Procedures CPT4 Code Description: 4696295 28413 - WC PHYS LEVEL 4 - NEW PT ICD-10 Diagnosis Description L98.498 Non-pressure chronic ulcer of skin of other sites with other spe L03.114 Cellulitis of left upper limb E11.51 Type 2 diabetes mellitus with diabetic  peripheral angiopathy wit E11.622 Type 2 diabetes mellitus with other skin ulcer Modifier: cified severit hout gangrene Quantity: 1 y Engineer, maintenance) Signed: 08/16/2018 4:31:45 PM By: Linton Ham MD Entered By: Linton Ham on 08/16/2018 12:43:39

## 2018-08-22 ENCOUNTER — Ambulatory Visit (INDEPENDENT_AMBULATORY_CARE_PROVIDER_SITE_OTHER): Payer: Medicare Other | Admitting: Orthopaedic Surgery

## 2018-08-22 ENCOUNTER — Encounter: Payer: Self-pay | Admitting: Orthopaedic Surgery

## 2018-08-22 ENCOUNTER — Other Ambulatory Visit: Payer: Self-pay | Admitting: Physician Assistant

## 2018-08-22 DIAGNOSIS — M79645 Pain in left finger(s): Secondary | ICD-10-CM

## 2018-08-22 MED ORDER — AMOXICILLIN-POT CLAVULANATE 500-125 MG PO TABS: 1.0000 | ORAL_TABLET | Freq: Two times a day (BID) | ORAL | 0 refills | Status: DC

## 2018-08-22 NOTE — Progress Notes (Signed)
Office Visit Note   Patient: [name]           Date of Birth: [birth year]           MRN: [id number] Visit Date: 08/22/2018              Requested by: Sandi Mariscal, [place],  [place] [address] PCP: Sandi Mariscal, MD   Assessment & Plan: Visit Diagnoses:  1. 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  We will continue with local wound care for the desquamated area and Augmentin.  She will continue to see the wound center for this.  We will have her follow-up with [place] in 2 weeks time.  We will allow the gangrene to demarcate to help [place] determine what level of amputation she will need.  She will also see vascular surgery in the near future.  Should she start getting fevers or chills she will call and let us know.    Follow-Up Instructions: Return in about 2 weeks (around 09/05/2018).   Orders:  No orders of the defined types were placed in this encounter.  No orders of the defined types were placed in this encounter.     Procedures: No procedures performed   Clinical Data: No additional findings.   Subjective: Chief Complaint  Patient presents with  . Left Hand - Follow-up    MRI Review----    HPI patient is a pleasant 53 year old female who comes in today for follow-up of her left thumb.  She was seen in the ED on 08/11/2018 paronychia that was drained at the bedside.  Cultures grew out MSSA.  She has been seen by [place] most recently on 08/15/2018.  MRI ordered which showed questionable osteomyelitis in the distal and proximal phalanx of the thumb.  She is currently being treated by the wound care center.  She is on Augmentin.  She denies any fevers or chills.  She does have increased pain today.  Review of Systems as detailed in HPI.  All others reviewed and are negative.   Objective: Vital Signs: LMP 03/28/2011   Physical Exam well-developed and well-nourished female no acute distress.   Alert and oriented x3.  Ortho Exam left thumb shows dry gangrene with fibrinous exudate to the distal aspect with desquamation proximal to that.  There is healthy bleeding to the desquamated tissue.  She has no sensation to the distal thumb.  Specialty Comments:  No specialty comments available.  Imaging: No new imaging   PMFS History: Patient Active Problem List   Diagnosis Date Noted  . Left arm pain 06/27/2018  . Educated About Covid-19 Virus Infection 06/27/2018  . Anemia 09/26/2017  . Left hip pain 09/26/2017  . Neck pain 09/26/2017  . Leukocytosis 09/26/2017  . Chest pain 07/07/2017  . Left ventricular hypertrophy 06/17/2017  . ESRD on dialysis ([place]) 05/21/2011  . Diabetic retinopathy 05/21/2011  . Diastolic dysfunction 65/68/1275  . Hyperlipidemia, mixed 01/30/2011  . Type II diabetes mellitus with complication, uncontrolled ([place]) 01/29/2011  . Neuropathy 01/29/2011  . Hypertension    Past Medical History:  Diagnosis Date  . Anemia   . Arthritis    "knees"  . CAD (coronary artery disease)    Nonobstructive on CT 2019  . ESRD (end stage renal disease) ([place])   . Headache(784.0)   . High cholesterol   . Hypertension   . Nerve pain    "  they say I have L5 nerve damage; my lumbar"  . Pericardial effusion   . PVD (peripheral vascular disease) ([place])    Right leg stent in [place].  (No records)  . Type II diabetes mellitus (HCC)     Family History  Problem Relation Age of Onset  . Cancer Brother   . Heart disease Father        Died age 35  . Diabetes Father   . Hyperlipidemia Father   . Hypertension Father   . Stroke Father   . Diabetes Mother   . Hypertension Mother   . Stroke Mother   . Dementia Mother   . Diabetes Sister   . Diabetes Brother   . Hypertension Sister   . Hypertension Brother     Past Surgical History:  Procedure Laterality Date  . [place]; 1997  . PERICARDIAL WINDOW  02/2004   for pericardial effusion  . TUBAL  LIGATION  05/1995   Social History   Occupational History  . Not on file  Tobacco Use  . Smoking status: Current Every Day Smoker    Packs/day: 0.50    Years: 20.00    Pack years: 10.00    Types: Cigarettes  . Smokeless tobacco: Never Used  . Tobacco comment: She will try.    Substance and Sexual Activity  . Alcohol use: No  . Drug use: No  . Sexual activity: Not Currently

## 2018-08-23 ENCOUNTER — Ambulatory Visit: Payer: Medicare Other | Admitting: Internal Medicine

## 2018-09-04 ENCOUNTER — Other Ambulatory Visit: Payer: Self-pay

## 2018-09-04 ENCOUNTER — Ambulatory Visit
Admission: RE | Admit: 2018-09-04 | Discharge: 2018-09-04 | Disposition: A | Discharge status: Home / Self Care | Payer: Medicare Other | Source: Ambulatory Visit | Attending: Orthopaedic Surgery | Admitting: Orthopaedic Surgery

## 2018-09-04 DIAGNOSIS — M79645 Pain in left finger(s): Secondary | ICD-10-CM

## 2018-09-04 DIAGNOSIS — T7840XA Allergy, unspecified, initial encounter: Secondary | ICD-10-CM | POA: Insufficient documentation

## 2018-09-04 DIAGNOSIS — T782XXA Anaphylactic shock, unspecified, initial encounter: Secondary | ICD-10-CM

## 2018-09-04 HISTORY — DX: Anaphylactic shock, unspecified, initial encounter: T78.2XXA

## 2018-09-05 ENCOUNTER — Ambulatory Visit (INDEPENDENT_AMBULATORY_CARE_PROVIDER_SITE_OTHER): Payer: Medicare Other | Admitting: Orthopaedic Surgery

## 2018-09-05 DIAGNOSIS — I96 Gangrene, not elsewhere classified: Secondary | ICD-10-CM | POA: Diagnosis not present

## 2018-09-05 MED ORDER — AMOXICILLIN-POT CLAVULANATE 875-125 MG PO TABS: 1.0000 | ORAL_TABLET | Freq: Two times a day (BID) | ORAL | 0 refills | Status: DC

## 2018-09-05 NOTE — Progress Notes (Signed)
Office Visit Note   Patient: [name]           Date of Birth: 30-May-1965           MRN: [id number] Visit Date: 09/05/2018              Requested by: Sandi Mariscal, [place],  [place] [address] PCP: Sandi Mariscal, MD   Assessment & Plan: Visit Diagnoses:  1. Gangrene of thumb (HCC)     Plan: Most recent MRI of the left hand from yesterday shows rupture of the FPL and I feel that this is likely reactive fluid within th flexor tendon sheath.  She is not tender in the thenar compartment or along the FPL or the carpal tunnel nor she having any carpal tunnel symptoms.  The exposed distal phalanx and EPL with a gangrene is of concern therefore we will refer her to Dr. Burney Gauze to see if this would need partial amputation and debridement sooner than originally thought.  I have refilled her Augmentin.  She is to continue with local wound care.  She has another appointment with wound center tomorrow morning.  Follow-Up Instructions: Return if symptoms worsen or fail to improve.   Orders:  No orders of the defined types were placed in this encounter.  No orders of the defined types were placed in this encounter.     Procedures: No procedures performed   Clinical Data: No additional findings.   Subjective: No chief complaint on file.   [name] returns today for another wound check.  She states that her cardiologist ordered another MRI yesterday of her left hand.  She continues to deny any constitutional symptoms or worsening pain.  She continues to be on antibiotics.  She denies any increased pain in her left hand   Review of Systems  Constitutional: Negative.   HENT: Negative.   Eyes: Negative.   Respiratory: Negative.   Cardiovascular: Negative.   Endocrine: Negative.   Musculoskeletal: Negative.   Neurological: Negative.   Hematological: Negative.   Psychiatric/Behavioral: Negative.   All other systems reviewed and are negative.    Objective:  Vital Signs: LMP 03/28/2011   Physical Exam Vitals signs and nursing note reviewed.  Constitutional:      Appearance: She is well-developed.  Pulmonary:     Effort: Pulmonary effort is normal.  Skin:    General: Skin is warm.     Capillary Refill: Capillary refill takes less than 2 seconds.  Neurological:     Mental Status: She is alert and oriented to person, place, and time.  Psychiatric:        Behavior: Behavior normal.        Thought Content: Thought content normal.        Judgment: Judgment normal.     Ortho Exam Left hand exam shows gangrene of the left thumb tip.  There is exposed distal phalanx and exposed extensor tendon.  Much of the skin near the proximal portion of this thumb has now re-epithelialized.  The ulnar aspect of the proximal thumb skin has not quite yet but this is not any worse either.  She has no tenderness in her thenar compartment. Specialty Comments:  No specialty comments available.  Imaging: Mr Hand Left Wo Contrast  Result Date: 09/04/2018 CLINICAL DATA:  Dry gangrene of the left thumb. EXAM: MRI OF THE LEFT THUMB WITHOUT CONTRAST TECHNIQUE: Multiplanar, multisequence MR imaging of the left thumb was performed. No intravenous contrast was administered. COMPARISON:  Radiographs dated 08/11/2018 FINDINGS: Bones/Joint/Cartilage There is abnormal signal throughout the phalanges of the thumb with bone erosion of the distal half of the distal phalanx consistent with osteomyelitis. There is gas in the soft tissues of the distal thumb. Extensive soft tissue loss of the distal thumb. Small IP joint effusion. Muscles and Tendons The patient has ruptured the flexor pollicis longus tendon with proximal retraction since the prior study of 08/18/2018. Extensive edema in the muscles of the thenar eminence. There is also fluid around the flexor tendons in the palm extending to the carpal tunnel. Soft tissues Diffuse edema in the subcutaneous fat of the thumb and in the  hand. IMPRESSION: 1. 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 2. Interval rupture of the flexor pollicis longus tendon with proximal retraction. Edema in the thenar eminence. Fluid around the extensor tendons in the palm which could be infectious tenosynovitis. Electronically Signed   By: Lorriane Shire M.D.   On: 09/04/2018 14:40     PMFS History: Patient Active Problem List   Diagnosis Date Noted  . Left arm pain 06/27/2018  . Educated About Covid-19 Virus Infection 06/27/2018  . Anemia 09/26/2017  . Left hip pain 09/26/2017  . Neck pain 09/26/2017  . Leukocytosis 09/26/2017  . Chest pain 07/07/2017  . Left ventricular hypertrophy 06/17/2017  . ESRD on dialysis ([place]) 05/21/2011  . Diabetic retinopathy 05/21/2011  . Diastolic dysfunction 40/09/6759  . Hyperlipidemia, mixed 01/30/2011  . Type II diabetes mellitus with complication, uncontrolled ([place]) 01/29/2011  . Neuropathy 01/29/2011  . Hypertension    Past Medical History:  Diagnosis Date  . Anemia   . Arthritis    "knees"  . CAD (coronary artery disease)    Nonobstructive on CT 2019  . ESRD (end stage renal disease) ([place])   . Headache(784.0)   . High cholesterol   . Hypertension   . 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 Pericardial effusion   . PVD (peripheral vascular disease) ([place])    Right leg stent in [place].  (No records)  . Type II diabetes mellitus (HCC)     Family History  Problem Relation Age of Onset  . Cancer Brother   . Heart disease Father        Died age 66  . Diabetes Father   . Hyperlipidemia Father   . Hypertension Father   . Stroke Father   . Diabetes Mother   . Hypertension Mother   . Stroke Mother   . Dementia Mother   . Diabetes Sister   . Diabetes Brother   . Hypertension Sister   . Hypertension Brother     Past Surgical History:  Procedure Laterality Date  . [place]; 1997  .  PERICARDIAL WINDOW  02/2004   for pericardial effusion  . TUBAL LIGATION  05/1995   Social History   Occupational History  . Not on file  Tobacco Use  . Smoking status: Current Every Day Smoker    Packs/day: 0.50    Years: 20.00    Pack years: 10.00    Types: Cigarettes  . Smokeless tobacco: Never Used  . Tobacco comment: She will try.    Substance and Sexual Activity  . Alcohol use: No  . Drug use: No  . Sexual activity: Not Currently

## 2018-09-05 NOTE — Addendum Note (Signed)
Addended by: Meyer Cory on: 09/05/2018 01:40 PM   Modules accepted: Orders

## 2018-09-06 ENCOUNTER — Other Ambulatory Visit: Payer: Self-pay

## 2018-09-06 ENCOUNTER — Encounter (HOSPITAL_BASED_OUTPATIENT_CLINIC_OR_DEPARTMENT_OTHER): Payer: Medicare Other | Attending: Physician Assistant

## 2018-09-06 DIAGNOSIS — E11622 Type 2 diabetes mellitus with other skin ulcer: Secondary | ICD-10-CM | POA: Insufficient documentation

## 2018-09-06 DIAGNOSIS — L98496 Non-pressure chronic ulcer of skin of other sites with bone involvement without evidence of necrosis: Secondary | ICD-10-CM | POA: Insufficient documentation

## 2018-09-06 DIAGNOSIS — I5033 Acute on chronic diastolic (congestive) heart failure: Secondary | ICD-10-CM | POA: Diagnosis not present

## 2018-09-06 DIAGNOSIS — I132 Hypertensive heart and chronic kidney disease with heart failure and with stage 5 chronic kidney disease, or end stage renal disease: Secondary | ICD-10-CM | POA: Diagnosis not present

## 2018-09-06 DIAGNOSIS — E1122 Type 2 diabetes mellitus with diabetic chronic kidney disease: Secondary | ICD-10-CM | POA: Insufficient documentation

## 2018-09-06 DIAGNOSIS — N186 End stage renal disease: Secondary | ICD-10-CM | POA: Insufficient documentation

## 2018-09-06 DIAGNOSIS — Z992 Dependence on renal dialysis: Secondary | ICD-10-CM | POA: Diagnosis not present

## 2018-09-06 DIAGNOSIS — M79602 Pain in left arm: Secondary | ICD-10-CM

## 2018-09-07 ENCOUNTER — Other Ambulatory Visit: Payer: Self-pay | Admitting: *Deleted

## 2018-09-07 ENCOUNTER — Encounter: Payer: Self-pay | Admitting: Vascular Surgery

## 2018-09-07 ENCOUNTER — Encounter: Payer: Self-pay | Admitting: *Deleted

## 2018-09-07 ENCOUNTER — Ambulatory Visit (HOSPITAL_COMMUNITY)
Admission: RE | Admit: 2018-09-07 | Discharge: 2018-09-07 | Disposition: A | Discharge status: Home / Self Care | Payer: Medicare Other | Source: Ambulatory Visit | Attending: Family | Admitting: Family

## 2018-09-07 ENCOUNTER — Ambulatory Visit (INDEPENDENT_AMBULATORY_CARE_PROVIDER_SITE_OTHER): Payer: Medicare Other | Admitting: Vascular Surgery

## 2018-09-07 VITALS — BP 146/82 | HR 65 | Temp 97.3°F | Resp 20 | Ht 69.0 in | Wt 240.0 lb

## 2018-09-07 DIAGNOSIS — T82898A Other specified complication of vascular prosthetic devices, implants and grafts, initial encounter: Secondary | ICD-10-CM | POA: Diagnosis not present

## 2018-09-07 DIAGNOSIS — M79602 Pain in left arm: Secondary | ICD-10-CM | POA: Diagnosis present

## 2018-09-07 NOTE — H&P (View-Only) (Signed)
Referring Physician: Dr Dellia Nims  Patient name: [name] MRN: [id number] DOB: 1965/10/03 Sex: female  REASON FOR CONSULT: Gangrenous left first finger  HPI: [name] is a 53 y.o. female, with a several month history of slowly worsening wound in her left thumb.  She states over the last couple of weeks it got much worse.  She is currently followed at the wound center.  She has an existing fistula in her left upper arm.  She has never had any other prior access procedures.  Her dialysis today is Tuesday Thursday Saturday.  Her nephrologist is Dr. Posey Pronto.  She he is ambulatory but uses a wheelchair when going over long distances.  She has no fever or chills.  She states the wound has progressively enlarged over time.  There is some pain in the hand.  She does not know how the wound started.  Other medical problems include diabetes, coronary artery disease, elevated cholesterol, hypertension all of which have been stable.  She is currently on Augmentin.  Past Medical History:  Diagnosis Date  . Anemia   . Arthritis    "knees"  . CAD (coronary artery disease)    Nonobstructive on CT 2019  . ESRD (end stage renal disease) ([place])   . Headache(784.0)   . High cholesterol   . Hypertension   . 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 Pericardial effusion   . PVD (peripheral vascular disease) ([place])    Right leg stent in [place].  (No records)  . Type II diabetes mellitus ([place])    Past Surgical History:  Procedure Laterality Date  . [place]; 1997  . PERICARDIAL WINDOW  02/2004   for pericardial effusion  . TUBAL LIGATION  05/1995    Family History  Problem Relation Age of Onset  . Cancer Brother   . Heart disease Father        Died age 31  . Diabetes Father   . Hyperlipidemia Father   . Hypertension Father   . Stroke Father   . Diabetes Mother   . Hypertension Mother   . Stroke Mother   . Dementia Mother   . Diabetes Sister   .  Diabetes Brother   . Hypertension Sister   . Hypertension Brother     SOCIAL HISTORY: Social History   Socioeconomic History  . Marital status: Married    Spouse name: Not on file  . Number of children: Not on file  . Years of education: Not on file  . Highest education level: Not on file  Occupational History  . Not on file  Social Needs  . Financial resource strain: Not on file  . Food insecurity    Worry: Not on file    Inability: Not on file  . Transportation needs    Medical: Not on file    Non-medical: Not on file  Tobacco Use  . Smoking status: Current Every Day Smoker    Packs/day: 0.50    Years: 20.00    Pack years: 10.00    Types: Cigarettes  . Smokeless tobacco: Never Used  . Tobacco comment: She will try.    Substance and Sexual Activity  . Alcohol use: No  . Drug use: No  . Sexual activity: Not Currently  Lifestyle  . Physical activity    Days per week: Not on file    Minutes per session: Not on file  . Stress: Not  on file  Relationships  . Social Herbalist on phone: Not on file    Gets together: Not on file    Attends religious service: Not on file    Active member of club or organization: Not on file    Attends meetings of clubs or organizations: Not on file    Relationship status: Not on file  . Intimate partner violence    Fear of current or ex partner: Not on file    Emotionally abused: Not on file    Physically abused: Not on file    Forced sexual activity: Not on file  Other Topics Concern  . Not on file  Social History Narrative   Lives with son.         No Known Allergies  Current Outpatient Medications  Medication Sig Dispense Refill  . amLODipine (NORVASC) 5 MG tablet TK 1 T PO D    . amoxicillin-clavulanate (AUGMENTIN) 875-125 MG tablet Take 1 tablet by mouth 2 (two) times daily. 14 tablet 0  . atorvastatin (LIPITOR) 80 MG tablet Take 1 tablet (80 mg total) by mouth daily. Take 1/2 tab daily for one week, then  increase to the full tab. (Patient taking differently: Take 80 mg by mouth daily. ) 90 tablet 3  . AURYXIA 1 GM 210 MG(Fe) tablet TAKE 4 TABLETS BY MOUTH THREE TIMES DAILY WITH MEALS AND 2 TABLETS TWICE DAILY WITH SNACKS    . b complex-vitamin c-folic acid (NEPHRO-VITE) 0.8 MG TABS tablet TAKE 1 TABLET BY MOUTH EVERY DAY (ON DIALYSIS DAYS, TAKE AFTER DIALYSIS TREATMENT)    . cinacalcet (SENSIPAR) 90 MG tablet Take 90 mg by mouth daily.  3  . diphenoxylate-atropine (LOMOTIL) 2.5-0.025 MG tablet Take 1 tablet by mouth 4 (four) times daily as needed for diarrhea or loose stools. 30 tablet 0  . gabapentin (NEURONTIN) 100 MG capsule Take 300 mg by mouth daily.   3  . hydrOXYzine (ATARAX/VISTARIL) 25 MG tablet Take 25 mg by mouth daily.   1  . Insulin Lispro Prot & Lispro (HUMALOG MIX 75/25 KWIKPEN) (75-25) 100 UNIT/ML Kwikpen Inject 25 Units into the skin 2 (two) times daily with a meal.    . metoprolol succinate (TOPROL-XL) 25 MG 24 hr tablet Take 1 tablet (25 mg total) by mouth daily. 90 tablet 3  . oxyCODONE (ROXICODONE) 15 MG immediate release tablet Take 15 mg by mouth 3 (three) times daily as needed for pain.   0  . pramipexole (MIRAPEX) 0.5 MG tablet Take 0.5 mg by mouth daily.   1  . sevelamer carbonate (RENVELA) 800 MG tablet Take 2,400-4,000 mg by mouth See admin instructions. Take 5 capsules (4000 mg) by mouth up to three times daily with meals and 3 capsules (2400 mg) with snacks    . ENBREL 50 MG/ML injection INJ 1 ML [place] ONCE A WK     No current facility-administered medications for this visit.     ROS:   General:  No weight loss, Fever, chills  HEENT: No recent headaches, no nasal bleeding, no visual changes, no sore throat  Neurologic: No dizziness, blackouts, seizures. No recent symptoms of stroke or mini- stroke. No recent episodes of slurred speech, or temporary blindness.  Cardiac: No recent episodes of chest pain/pressure, no shortness of breath at rest.  No shortness of  breath with exertion.  Denies history of atrial fibrillation or irregular heartbeat  Vascular: No history of rest pain in feet.  No history of  claudication.  No history of non-healing ulcer, No history of DVT   Pulmonary: No home oxygen, no productive cough, no hemoptysis,  No asthma or wheezing  Musculoskeletal:  [ ]  Arthritis, [X]  Low back pain,  [ ]  Joint pain  Hematologic:No history of hypercoagulable state.  No history of easy bleeding.  No history of anemia  Gastrointestinal: No hematochezia or melena,  No gastroesophageal reflux, no trouble swallowing  Urinary: [X]  chronic Kidney disease, [X]  on HD - [ ]  MWF or [X]  TTHS, [ ]  Burning with urination, [ ]  Frequent urination, [ ]  Difficulty urinating;   Skin: No rashes  Psychological: No history of anxiety,  No history of depression   Physical Examination  Vitals:   09/07/18 1351  BP: (!) 146/82  Pulse: 65  Resp: 20  Temp: (!) 97.3 F (36.3 C)  SpO2: 96%  Weight: 240 lb (108.9 kg)  Height: 5\' 9"  (1.753 m)    Body mass index is 35.44 kg/m.  General:  Alert and oriented, no acute distress HEENT: Normal Neck: No JVD Pulmonary: Clear to auscultation bilaterally Cardiac: Regular Rate and Rhythm Skin: No rash      Extremity Pulses: 2+ brachial pulse right arm 1+ radial pulse right arm absent left radial pulse palpable thrill left upper arm AV fistula  DATA:  Patient had a duplex ultrasound today with a gradient of 30 mmHg with compression of the AV fistula suggestive of steal.  ASSESSMENT: Gangrenous left first finger.  This is most likely secondary to steal.  However she could have some underlying small vessel disease as well.   PLAN: Ligation of left arm AV fistula placement of hemodialysis catheter.  High likelihood she will end up with amputation of her left first finger.  She will continue to follow-up at the wound center while we are getting her ligation scheduled.  I will leave it at the discretion of Dr.  Dellia Nims whether or not he wishes to refer her to hand surgery for possible amputation of her finger.   Ruta Hinds, MD [facility] Office: [phone] Pager: [phone]

## 2018-09-07 NOTE — Progress Notes (Signed)
Referring Physician: Dr Dellia Nims  Patient name: [name] MRN: [id number] DOB: July 07, 1965 Sex: female  REASON FOR CONSULT: Gangrenous left first finger  HPI: [name] is a 53 y.o. female, with a several month history of slowly worsening wound in her left thumb.  She states over the last couple of weeks it got much worse.  She is currently followed at the wound center.  She has an existing fistula in her left upper arm.  She has never had any other prior access procedures.  Her dialysis today is Tuesday Thursday Saturday.  Her nephrologist is Dr. Posey Pronto.  She he is ambulatory but uses a wheelchair when going over long distances.  She has no fever or chills.  She states the wound has progressively enlarged over time.  There is some pain in the hand.  She does not know how the wound started.  Other medical problems include diabetes, coronary artery disease, elevated cholesterol, hypertension all of which have been stable.  She is currently on Augmentin.  Past Medical History:  Diagnosis Date  . Anemia   . Arthritis    "knees"  . CAD (coronary artery disease)    Nonobstructive on CT 2019  . ESRD (end stage renal disease) ([place])   . Headache(784.0)   . High cholesterol   . Hypertension   . 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 Pericardial effusion   . PVD (peripheral vascular disease) ([place])    Right leg stent in [place].  (No records)  . Type II diabetes mellitus ([place])    Past Surgical History:  Procedure Laterality Date  . [place]; 1997  . PERICARDIAL WINDOW  02/2004   for pericardial effusion  . TUBAL LIGATION  05/1995    Family History  Problem Relation Age of Onset  . Cancer Brother   . Heart disease Father        Died age 65  . Diabetes Father   . Hyperlipidemia Father   . Hypertension Father   . Stroke Father   . Diabetes Mother   . Hypertension Mother   . Stroke Mother   . Dementia Mother   . Diabetes Sister   .  Diabetes Brother   . Hypertension Sister   . Hypertension Brother     SOCIAL HISTORY: Social History   Socioeconomic History  . Marital status: Married    Spouse name: Not on file  . Number of children: Not on file  . Years of education: Not on file  . Highest education level: Not on file  Occupational History  . Not on file  Social Needs  . Financial resource strain: Not on file  . Food insecurity    Worry: Not on file    Inability: Not on file  . Transportation needs    Medical: Not on file    Non-medical: Not on file  Tobacco Use  . Smoking status: Current Every Day Smoker    Packs/day: 0.50    Years: 20.00    Pack years: 10.00    Types: Cigarettes  . Smokeless tobacco: Never Used  . Tobacco comment: She will try.    Substance and Sexual Activity  . Alcohol use: No  . Drug use: No  . Sexual activity: Not Currently  Lifestyle  . Physical activity    Days per week: Not on file    Minutes per session: Not on file  . Stress: Not  on file  Relationships  . Social Herbalist on phone: Not on file    Gets together: Not on file    Attends religious service: Not on file    Active member of club or organization: Not on file    Attends meetings of clubs or organizations: Not on file    Relationship status: Not on file  . Intimate partner violence    Fear of current or ex partner: Not on file    Emotionally abused: Not on file    Physically abused: Not on file    Forced sexual activity: Not on file  Other Topics Concern  . Not on file  Social History Narrative   Lives with son.         No Known Allergies  Current Outpatient Medications  Medication Sig Dispense Refill  . amLODipine (NORVASC) 5 MG tablet TK 1 T PO D    . amoxicillin-clavulanate (AUGMENTIN) 875-125 MG tablet Take 1 tablet by mouth 2 (two) times daily. 14 tablet 0  . atorvastatin (LIPITOR) 80 MG tablet Take 1 tablet (80 mg total) by mouth daily. Take 1/2 tab daily for one week, then  increase to the full tab. (Patient taking differently: Take 80 mg by mouth daily. ) 90 tablet 3  . AURYXIA 1 GM 210 MG(Fe) tablet TAKE 4 TABLETS BY MOUTH THREE TIMES DAILY WITH MEALS AND 2 TABLETS TWICE DAILY WITH SNACKS    . b complex-vitamin c-folic acid (NEPHRO-VITE) 0.8 MG TABS tablet TAKE 1 TABLET BY MOUTH EVERY DAY (ON DIALYSIS DAYS, TAKE AFTER DIALYSIS TREATMENT)    . cinacalcet (SENSIPAR) 90 MG tablet Take 90 mg by mouth daily.  3  . diphenoxylate-atropine (LOMOTIL) 2.5-0.025 MG tablet Take 1 tablet by mouth 4 (four) times daily as needed for diarrhea or loose stools. 30 tablet 0  . gabapentin (NEURONTIN) 100 MG capsule Take 300 mg by mouth daily.   3  . hydrOXYzine (ATARAX/VISTARIL) 25 MG tablet Take 25 mg by mouth daily.   1  . Insulin Lispro Prot & Lispro (HUMALOG MIX 75/25 KWIKPEN) (75-25) 100 UNIT/ML Kwikpen Inject 25 Units into the skin 2 (two) times daily with a meal.    . metoprolol succinate (TOPROL-XL) 25 MG 24 hr tablet Take 1 tablet (25 mg total) by mouth daily. 90 tablet 3  . oxyCODONE (ROXICODONE) 15 MG immediate release tablet Take 15 mg by mouth 3 (three) times daily as needed for pain.   0  . pramipexole (MIRAPEX) 0.5 MG tablet Take 0.5 mg by mouth daily.   1  . sevelamer carbonate (RENVELA) 800 MG tablet Take 2,400-4,000 mg by mouth See admin instructions. Take 5 capsules (4000 mg) by mouth up to three times daily with meals and 3 capsules (2400 mg) with snacks    . ENBREL 50 MG/ML injection INJ 1 ML [place] ONCE A WK     No current facility-administered medications for this visit.     ROS:   General:  No weight loss, Fever, chills  HEENT: No recent headaches, no nasal bleeding, no visual changes, no sore throat  Neurologic: No dizziness, blackouts, seizures. No recent symptoms of stroke or mini- stroke. No recent episodes of slurred speech, or temporary blindness.  Cardiac: No recent episodes of chest pain/pressure, no shortness of breath at rest.  No shortness of  breath with exertion.  Denies history of atrial fibrillation or irregular heartbeat  Vascular: No history of rest pain in feet.  No history of  claudication.  No history of non-healing ulcer, No history of DVT   Pulmonary: No home oxygen, no productive cough, no hemoptysis,  No asthma or wheezing  Musculoskeletal:  [ ]  Arthritis, [X]  Low back pain,  [ ]  Joint pain  Hematologic:No history of hypercoagulable state.  No history of easy bleeding.  No history of anemia  Gastrointestinal: No hematochezia or melena,  No gastroesophageal reflux, no trouble swallowing  Urinary: [X]  chronic Kidney disease, [X]  on HD - [ ]  MWF or [X]  TTHS, [ ]  Burning with urination, [ ]  Frequent urination, [ ]  Difficulty urinating;   Skin: No rashes  Psychological: No history of anxiety,  No history of depression   Physical Examination  Vitals:   09/07/18 1351  BP: (!) 146/82  Pulse: 65  Resp: 20  Temp: (!) 97.3 F (36.3 C)  SpO2: 96%  Weight: 240 lb (108.9 kg)  Height: 5\' 9"  (1.753 m)    Body mass index is 35.44 kg/m.  General:  Alert and oriented, no acute distress HEENT: Normal Neck: No JVD Pulmonary: Clear to auscultation bilaterally Cardiac: Regular Rate and Rhythm Skin: No rash      Extremity Pulses: 2+ brachial pulse right arm 1+ radial pulse right arm absent left radial pulse palpable thrill left upper arm AV fistula  DATA:  Patient had a duplex ultrasound today with a gradient of 30 mmHg with compression of the AV fistula suggestive of steal.  ASSESSMENT: Gangrenous left first finger.  This is most likely secondary to steal.  However she could have some underlying small vessel disease as well.   PLAN: Ligation of left arm AV fistula placement of hemodialysis catheter.  High likelihood she will end up with amputation of her left first finger.  She will continue to follow-up at the wound center while we are getting her ligation scheduled.  I will leave it at the discretion of Dr.  Dellia Nims whether or not he wishes to refer her to hand surgery for possible amputation of her finger.   Ruta Hinds, MD [facility] Office: [phone] Pager: [phone]

## 2018-09-13 DIAGNOSIS — E11622 Type 2 diabetes mellitus with other skin ulcer: Secondary | ICD-10-CM | POA: Diagnosis not present

## 2018-09-14 ENCOUNTER — Other Ambulatory Visit (HOSPITAL_COMMUNITY)
Admission: RE | Admit: 2018-09-14 | Discharge: 2018-09-14 | Disposition: A | Discharge status: Home / Self Care | Payer: Medicare Other | Source: Ambulatory Visit | Attending: Vascular Surgery | Admitting: Vascular Surgery

## 2018-09-14 DIAGNOSIS — U071 COVID-19: Secondary | ICD-10-CM | POA: Diagnosis not present

## 2018-09-14 DIAGNOSIS — Z01812 Encounter for preprocedural laboratory examination: Secondary | ICD-10-CM | POA: Diagnosis not present

## 2018-09-15 ENCOUNTER — Encounter (HOSPITAL_COMMUNITY): Payer: Self-pay | Admitting: Vascular Surgery

## 2018-09-15 ENCOUNTER — Encounter (HOSPITAL_COMMUNITY): Payer: Self-pay | Admitting: *Deleted

## 2018-09-15 ENCOUNTER — Telehealth: Payer: Self-pay | Admitting: *Deleted

## 2018-09-15 ENCOUNTER — Other Ambulatory Visit: Payer: Self-pay

## 2018-09-15 LAB — SARS CORONAVIRUS 2 (TAT 6-24 HRS): SARS Coronavirus 2: POSITIVE — AB

## 2018-09-15 NOTE — Progress Notes (Signed)
Anesthesia Chart Review: SAME DAY WORK-UP   Case: [id number] Date/Time: 09/18/18 1223   Procedures:      LIGATION OF ARTERIOVENOUS  FISTULA LEFT ARM (Left )     INSERTION OF DIALYSIS CATHETER (N/A )   Anesthesia type: Choice   Pre-op diagnosis: end stage renal disease   Location: [facility] OR ROOM 11 / [place] OR   Surgeon: Elam Dutch, MD     PATIENT TESTED POSITIVE FOR COVID-19 ON 09/14/18. SHE REPORTED COUGHING x 2 DAYS. PER [facility] NOTES, PATIENT NOTIFIED AND TOLD TO QUARANTINE AND AWAIT F/U PER ID. HER HD CENTER WAS NOTIFIED. GIVEN NATURE OF SURGERY, DR. FIELDS PLANS TO PROCEED, BUT IS MOVING CASE TO HIS LAST CASE.    DISCUSSION: Patient is a 53 year old female scheduled for the above procedure. She has a LUE AVF and developed paronychia of the left thumb in July initially treated with drainage and Augmentin. She was seen by Ortho and Wound Care. MRI showed osteomyelitis, but also concern for steal syndrome and seen by Dr. Oneida Alar on 09/07/18 for left thumb gangrene with concern she may ultimately require amputation of that finger. He felt gangrene likely due to steal, although possible underlying small vessel disease as well. He recommended the above procedure, and is deferring decision to hand surgeon to Wound Care Linton Ham, MD).   History includes ESRD (HD TTS, [facility]), smoking, HTN, DM2, hypercholesterolemia, PVD (s/p RLE stent), anemia, CAD (non-obstructive 2019 coronary CT), LVH, pericarditis with pericardial effusion (s/p pericardial window 02/07/04). She is followed by Dr. 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    Recent + COVID results communicated on anesthesia board and with Short Stay Flow RN coordinator.   PROVIDERS: Sandi Mariscal, MD is PCP  Minus Breeding, MD is cardiologist. Last visit 06/27/18. He ordered a  cardiac MRI given severe LVH. Possible variant morphology of hypertrophic CM, but study non-diagnostic since gadolinium could not be sued given renal disease. He plans to see her back in September to discuss next steps/testing.    LABS: She is for labs on the day of surgery.   IMAGES: MR left hand 09/04/18: IMPRESSION: 1. 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 2. Interval rupture of the flexor pollicis longus tendon with proximal retraction. Edema in the thenar eminence. Fluid around the extensor tendons in the palm which could be infectious tenosynovitis.   EKG: EKG > 58 year old. EKGs on 09/14/17 showed NSR, RBBB and SB.   CV: MR Cardiac 07/31/18: IMPRESSION: 1.  Normal biventricular chamber size and function. 2. Asymmetric septal hypertrophy, 19 mm at the LV basal anteroseptum and anterior wall, and 15 mm at the LV mid inferoseptum. No significant flow dephasing to suggest LV outflow tract obstruction. No evidence of systolic anterior motion of the mitral valve. These findings in combination could suggest a variant morphology of hypertrophic cardiomyopathy. 3. Gadolinium contrast was not administered due to end stage renal disease. Unable to assess for burden of delayed enhancement in the evaluation of possible hypertrophic cardiomyopathy or other cardiomyopathic process.   CT Coronary with FFR 07/08/17: FINDINGS: Aorta:  Normal size.  Mild diffuse calcifications.  No dissection. Aortic Valve:  Trileaflet.  No calcifications. Coronary Arteries:  Normal coronary origin.  Right dominance. - RCA is a large dominant artery that gives rise  to PDA and PLVB. There mild to moderate diffuse calcified plaque with maximum stenosis 50-69% in proximal to mid segment. PDA and PLA are poorly visualized secondary to patients size. - Left main is a large artery that gives rise to LAD and LCX arteries. Left main has no plaque. -  LAD is a large vessel that gives rise to one diagonal artery. There is diffuse moderate calcified plaque with maximum stenosis of 50-69% in the mid segment. - D1 is a medium size artery with mild diffuse calcified plaque and maximum stenosis 25-50% in the proximal portion. - LCX is a medium size non-dominant artery that gives rise to one large OM1 branch. There is only minimal calcified plaque. Other findings: - Normal pulmonary vein drainage into the left atrium. - Normal let atrial appendage without a thrombus. - Severely dilated pulmonary artery measuring 48 mm suggestive of pulmonary hypertension. IMPRESSION: 1. Coronary calcium score of 837. This was 3 percentile for age and sex matched control. 2. Normal coronary origin with right dominance. 3. Diffuse calcified plaque with moderate CAD in the proximal to mid RCA and mid LAD. Additional analysis with CT FFR will be submitted. Aggressive risk factor modification should be initiated. 4. Severely dilated pulmonary artery measuring 48 mm suggestive of pulmonary hypertension. (I do not see the FFR report, but cardiology notes indicate non-obstructive CAD with medication therapy recommended.)   Echo 06/29/17: Study Conclusions - Left ventricle: The cavity size was normal. There was severe   concentric hypertrophy. Systolic function was normal. The   estimated ejection fraction was in the range of 50% to 55%. Wall   motion was normal; there were no regional wall motion   abnormalities. Doppler parameters are consistent with abnormal   left ventricular relaxation (grade 1 diastolic dysfunction). - Aortic valve: Transvalvular velocity was within the normal range.   There was no stenosis. There was trivial regurgitation. - Mitral valve: Transvalvular velocity was within the normal range.   There was no evidence for stenosis. There was mild regurgitation. - Left atrium: The atrium was severely dilated. - Right ventricle: The cavity  size was normal. Wall thickness was   normal. Systolic function was normal. - Atrial septum: No defect or patent foramen ovale was identified   by color flow Doppler. - Tricuspid valve: There was trivial regurgitation. - Pulmonary arteries: Systolic pressure was within the normal   range. PA peak pressure: 41 mm Hg (S). - Pericardium, extracardiac: A trivial pericardial effusion was   identified.   Past Medical History:  Diagnosis Date  . Anemia   . Arthritis    "knees"  . CAD (coronary artery disease)    Nonobstructive on CT 2019  . ESRD (end stage renal disease) ([place])   . Headache(784.0)   . High cholesterol   . Hypertension   . 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 Pericardial effusion   . PVD (peripheral vascular disease) ([place])    Right leg stent in [place].  (No records)  . Type II diabetes mellitus ([place])     Past Surgical History:  Procedure Laterality Date  . [place]; 1997  . PERICARDIAL WINDOW  02/2004   for pericardial effusion  . TUBAL LIGATION  05/1995    MEDICATIONS: No current facility-administered medications for this encounter.    Marland Kitchen amoxicillin-clavulanate (AUGMENTIN) 875-125 MG tablet  . atorvastatin (LIPITOR) 80 MG tablet  . AURYXIA 1 GM 210 MG(Fe) tablet  .  b complex-vitamin c-folic acid (NEPHRO-VITE) 0.8 MG TABS tablet  . diphenoxylate-atropine (LOMOTIL) 2.5-0.025 MG tablet  . ENBREL 50 MG/ML injection  . gabapentin (NEURONTIN) 100 MG capsule  . hydrOXYzine (ATARAX/VISTARIL) 25 MG tablet  . Insulin Lispro Prot & Lispro (HUMALOG MIX 75/25 KWIKPEN) (75-25) 100 UNIT/ML Kwikpen  . metoprolol succinate (TOPROL-XL) 25 MG 24 hr tablet  . oxyCODONE (ROXICODONE) 15 MG immediate release tablet  . pramipexole (MIRAPEX) 0.5 MG tablet    Myra Gianotti, PA-C Surgical Short Stay/Anesthesiology [facility] Phone [phone] [facility] Phone [phone] 09/15/2018 12:29 PM

## 2018-09-15 NOTE — Anesthesia Preprocedure Evaluation (Deleted)
Anesthesia Evaluation    Reviewed: Allergy & Precautions, Patient's Chart, lab work & pertinent test results  Airway        Dental   Pulmonary sleep apnea , Current Smoker,           Cardiovascular hypertension, Pt. on home beta blockers + Peripheral Vascular Disease       Neuro/Psych  Headaches, negative psych ROS   GI/Hepatic negative GI ROS, Neg liver ROS,   Endo/Other  diabetes, Insulin Dependent  Renal/GU ESRF and DialysisRenal disease     Musculoskeletal negative musculoskeletal ROS (+) Restless legs   Abdominal (+) + obese,   Peds  Hematology  (+) anemia , HLD   Anesthesia Other Findings end stage renal disease COVID +  Reproductive/Obstetrics                            Anesthesia Physical Anesthesia Plan  ASA:   Anesthesia Plan:    Post-op Pain Management:    Induction:   PONV Risk Score and Plan:   Airway Management Planned:   Additional Equipment:   Intra-op Plan:   Post-operative Plan:   Informed Consent:   Plan Discussed with:   Anesthesia Plan Comments: (See PAT note written 09/15/2018 by Myra Gianotti, PA-C. History includes ESRD, smoking, DM2, CAD (non-obstructive 2019 coronary CT), LVH (followed by Dr. 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

## 2018-09-15 NOTE — Progress Notes (Signed)
Becky, surgical scheduler at [facility] for Dr. Oneida Alar notified of pt's abnormal COVID test. Jacqlyn Larsen will notify Dr. Oneida Alar.   Jacqlyn Larsen, RN

## 2018-09-15 NOTE — Progress Notes (Signed)
Spoke with pt for pre-op call. Pt has hx of non obstructive CAD, Dr. Percival Spanish is her cardiologist. Denies any recent chest pain. Pt is a type 2 diabetic. Per pt her last A1C was 11.0 two - three months ago. States her fasting blood sugar is usually between 140-182. Instructed pt on Sunday evening to take 70% of her Humalog 75/25 Insulin, she will take 21 units. Instructed her not to take any the morning of surgery. Instructed her to check her blood sugar when she gets up Monday AM and every 2 hours until she leaves for the hospital. If blood sugar is 70 or below, treat with 1/2 cup of clear juice (apple or cranberry) and recheck blood sugar 15 minutes after drinking juice. If blood sugar continues to be 70 or below, call the Short Stay department and ask to speak to a nurse. Pt voiced understanding.  Pt had her Covid test done yesterday and it is positive. Pt's surgeon has been notified, Short Stay and OR have been notified. Pt instructed to call the Short Stay front desk when she arrives at 10 AM Monday. I instructed her to stay in her vehicle until the [facility] staff comes out with a wheelchair to take her to her pre-op room. I instructed her that she needs to wear a mask and will have to keep it on at all times. I also informed that she cannot have any visitors due to the fact they have been exposed to her. She voiced understanding.

## 2018-09-15 NOTE — Telephone Encounter (Signed)
I called the patient to make her aware that we would need for her to change her arrival time on Monday 8-17 to 10:00 instead of 6:30 d/t her COVID testing positive. Unfortunately, she had not been contacted by ID yet and so I had to relay this finding to her. She was not surprised because she stated that her brother was also positive and she "has been coughing her head off for 2 days". She said that she was in contact with her brother this past weekend and he tested positive shortly after that. She was in our office on 09-07-2018 to see Dr. Oneida Alar but reports that she had no signs or symptoms at that time nor had she been in contact with her brother. I told her that our ID dept would be calling her with an appt and information but I wanted her to quarantine immediately. She voice understanding of the gravity of the situation and would wait for instruction from ID.    I also called our OR charge nurse and notified her that this patient was positive and would need to be the last case for Dr. Oneida Alar on Monday; Dr. Oneida Alar is aware. I called the patient[facility] dialysis [facility] and notified their charge nurse, Debroah Baller, of the situation. She will contact the patient and arrange for special dialysis times.

## 2018-09-18 ENCOUNTER — Inpatient Hospital Stay (HOSPITAL_COMMUNITY): Payer: Medicare Other

## 2018-09-18 ENCOUNTER — Other Ambulatory Visit: Payer: Self-pay

## 2018-09-18 ENCOUNTER — Encounter (HOSPITAL_COMMUNITY)
Admission: RE | Disposition: A | Discharge status: Home / Self Care | Payer: Self-pay | Source: Home / Self Care | Attending: Internal Medicine

## 2018-09-18 ENCOUNTER — Encounter (HOSPITAL_COMMUNITY): Payer: Self-pay

## 2018-09-18 ENCOUNTER — Inpatient Hospital Stay (HOSPITAL_COMMUNITY)
Admission: RE | Admit: 2018-09-18 | Discharge: 2018-09-20 | DRG: 177 | Disposition: A | Discharge status: Home / Self Care | Payer: Medicare Other | Attending: Family Medicine | Admitting: Family Medicine

## 2018-09-18 DIAGNOSIS — U071 COVID-19: Secondary | ICD-10-CM

## 2018-09-18 DIAGNOSIS — I96 Gangrene, not elsewhere classified: Secondary | ICD-10-CM

## 2018-09-18 DIAGNOSIS — D631 Anemia in chronic kidney disease: Secondary | ICD-10-CM | POA: Diagnosis not present

## 2018-09-18 DIAGNOSIS — Z833 Family history of diabetes mellitus: Secondary | ICD-10-CM | POA: Diagnosis not present

## 2018-09-18 DIAGNOSIS — E1169 Type 2 diabetes mellitus with other specified complication: Secondary | ICD-10-CM | POA: Diagnosis present

## 2018-09-18 DIAGNOSIS — I2510 Atherosclerotic heart disease of native coronary artery without angina pectoris: Secondary | ICD-10-CM | POA: Diagnosis present

## 2018-09-18 DIAGNOSIS — J1289 Other viral pneumonia: Secondary | ICD-10-CM | POA: Diagnosis present

## 2018-09-18 DIAGNOSIS — Z8349 Family history of other endocrine, nutritional and metabolic diseases: Secondary | ICD-10-CM | POA: Diagnosis not present

## 2018-09-18 DIAGNOSIS — M549 Dorsalgia, unspecified: Secondary | ICD-10-CM | POA: Diagnosis present

## 2018-09-18 DIAGNOSIS — E1165 Type 2 diabetes mellitus with hyperglycemia: Secondary | ICD-10-CM | POA: Diagnosis not present

## 2018-09-18 DIAGNOSIS — Z823 Family history of stroke: Secondary | ICD-10-CM | POA: Diagnosis not present

## 2018-09-18 DIAGNOSIS — N186 End stage renal disease: Secondary | ICD-10-CM

## 2018-09-18 DIAGNOSIS — N2581 Secondary hyperparathyroidism of renal origin: Secondary | ICD-10-CM | POA: Diagnosis present

## 2018-09-18 DIAGNOSIS — E1152 Type 2 diabetes mellitus with diabetic peripheral angiopathy with gangrene: Secondary | ICD-10-CM | POA: Diagnosis present

## 2018-09-18 DIAGNOSIS — F17210 Nicotine dependence, cigarettes, uncomplicated: Secondary | ICD-10-CM | POA: Diagnosis present

## 2018-09-18 DIAGNOSIS — G8929 Other chronic pain: Secondary | ICD-10-CM | POA: Diagnosis present

## 2018-09-18 DIAGNOSIS — G4730 Sleep apnea, unspecified: Secondary | ICD-10-CM | POA: Diagnosis present

## 2018-09-18 DIAGNOSIS — I120 Hypertensive chronic kidney disease with stage 5 chronic kidney disease or end stage renal disease: Secondary | ICD-10-CM | POA: Diagnosis present

## 2018-09-18 DIAGNOSIS — Z992 Dependence on renal dialysis: Secondary | ICD-10-CM | POA: Diagnosis not present

## 2018-09-18 DIAGNOSIS — I422 Other hypertrophic cardiomyopathy: Secondary | ICD-10-CM | POA: Diagnosis present

## 2018-09-18 DIAGNOSIS — M869 Osteomyelitis, unspecified: Secondary | ICD-10-CM | POA: Diagnosis present

## 2018-09-18 DIAGNOSIS — D649 Anemia, unspecified: Secondary | ICD-10-CM | POA: Diagnosis present

## 2018-09-18 DIAGNOSIS — G9341 Metabolic encephalopathy: Secondary | ICD-10-CM

## 2018-09-18 DIAGNOSIS — E782 Mixed hyperlipidemia: Secondary | ICD-10-CM

## 2018-09-18 DIAGNOSIS — G2581 Restless legs syndrome: Secondary | ICD-10-CM | POA: Diagnosis present

## 2018-09-18 DIAGNOSIS — Z809 Family history of malignant neoplasm, unspecified: Secondary | ICD-10-CM | POA: Diagnosis not present

## 2018-09-18 DIAGNOSIS — E118 Type 2 diabetes mellitus with unspecified complications: Secondary | ICD-10-CM

## 2018-09-18 DIAGNOSIS — E785 Hyperlipidemia, unspecified: Secondary | ICD-10-CM | POA: Diagnosis present

## 2018-09-18 DIAGNOSIS — E1122 Type 2 diabetes mellitus with diabetic chronic kidney disease: Secondary | ICD-10-CM | POA: Diagnosis present

## 2018-09-18 DIAGNOSIS — Z8249 Family history of ischemic heart disease and other diseases of the circulatory system: Secondary | ICD-10-CM | POA: Diagnosis not present

## 2018-09-18 HISTORY — DX: Gangrene, not elsewhere classified: I96

## 2018-09-18 HISTORY — DX: Metabolic encephalopathy: G93.41

## 2018-09-18 HISTORY — DX: Sleep apnea, unspecified: G47.30

## 2018-09-18 HISTORY — DX: COVID-19: U07.1

## 2018-09-18 HISTORY — DX: Restless legs syndrome: G25.81

## 2018-09-18 LAB — POCT I-STAT 7, (LYTES, BLD GAS, ICA,H+H)
Acid-Base Excess: 7 mmol/L — ABNORMAL HIGH (ref 0.0–2.0)
Bicarbonate: 32 mmol/L — ABNORMAL HIGH (ref 20.0–28.0)
Calcium, Ion: 1.06 mmol/L — ABNORMAL LOW (ref 1.15–1.40)
HCT: 22 % — ABNORMAL LOW (ref 36.0–46.0)
Hemoglobin: 7.5 g/dL — ABNORMAL LOW (ref 12.0–15.0)
O2 Saturation: 98 %
Patient temperature: 97.2
Potassium: 4.2 mmol/L (ref 3.5–5.1)
Sodium: 137 mmol/L (ref 135–145)
TCO2: 34 mmol/L — ABNORMAL HIGH (ref 22–32)
pCO2 arterial: 48.3 mmHg — ABNORMAL HIGH (ref 32.0–48.0)
pH, Arterial: 7.426 (ref 7.350–7.450)
pO2, Arterial: 108 mmHg (ref 83.0–108.0)

## 2018-09-18 LAB — C-REACTIVE PROTEIN: CRP: 7.9 mg/dL — ABNORMAL HIGH (ref <1.0)

## 2018-09-18 LAB — COMPREHENSIVE METABOLIC PANEL
ALT: 23 U/L (ref 0–44)
AST: 36 U/L (ref 15–41)
Albumin: 3.3 g/dL — ABNORMAL LOW (ref 3.5–5.0)
Alkaline Phosphatase: 74 U/L (ref 38–126)
Anion gap: 17 — ABNORMAL HIGH (ref 5–15)
BUN: 37 mg/dL — ABNORMAL HIGH (ref 6–20)
CO2: 29 mmol/L (ref 22–32)
Calcium: 8.7 mg/dL — ABNORMAL LOW (ref 8.9–10.3)
Chloride: 93 mmol/L — ABNORMAL LOW (ref 98–111)
Creatinine, Ser: 8.98 mg/dL — ABNORMAL HIGH (ref 0.44–1.00)
GFR calc Af Amer: 5 mL/min — ABNORMAL LOW (ref >60)
GFR calc non Af Amer: 4 mL/min — ABNORMAL LOW (ref >60)
Glucose, Bld: 145 mg/dL — ABNORMAL HIGH (ref 70–99)
Potassium: 4.4 mmol/L (ref 3.5–5.1)
Sodium: 139 mmol/L (ref 135–145)
Total Bilirubin: 0.8 mg/dL (ref 0.3–1.2)
Total Protein: 7.8 g/dL (ref 6.5–8.1)

## 2018-09-18 LAB — CBC WITH DIFFERENTIAL/PLATELET
Abs Immature Granulocytes: 0.02 10*3/uL (ref 0.00–0.07)
Basophils Absolute: 0 10*3/uL (ref 0.0–0.1)
Basophils Relative: 0 %
Eosinophils Absolute: 0.4 10*3/uL (ref 0.0–0.5)
Eosinophils Relative: 8 %
HCT: 26 % — ABNORMAL LOW (ref 36.0–46.0)
Hemoglobin: 8.1 g/dL — ABNORMAL LOW (ref 12.0–15.0)
Immature Granulocytes: 0 %
Lymphocytes Relative: 16 %
Lymphs Abs: 0.7 10*3/uL (ref 0.7–4.0)
MCH: 31.8 pg (ref 26.0–34.0)
MCHC: 31.2 g/dL (ref 30.0–36.0)
MCV: 102 fL — ABNORMAL HIGH (ref 80.0–100.0)
Monocytes Absolute: 0.3 10*3/uL (ref 0.1–1.0)
Monocytes Relative: 7 %
Neutro Abs: 3.2 10*3/uL (ref 1.7–7.7)
Neutrophils Relative %: 69 %
Platelets: 175 10*3/uL (ref 150–400)
RBC: 2.55 MIL/uL — ABNORMAL LOW (ref 3.87–5.11)
RDW: 17.2 % — ABNORMAL HIGH (ref 11.5–15.5)
WBC: 4.6 10*3/uL (ref 4.0–10.5)
nRBC: 0 % (ref 0.0–0.2)

## 2018-09-18 LAB — FERRITIN: Ferritin: 1561 ng/mL — ABNORMAL HIGH (ref 11–307)

## 2018-09-18 LAB — TYPE AND SCREEN
ABO/RH(D): A POS
Antibody Screen: NEGATIVE

## 2018-09-18 LAB — TROPONIN I (HIGH SENSITIVITY): Troponin I (High Sensitivity): 91 ng/L — ABNORMAL HIGH (ref <18)

## 2018-09-18 LAB — FIBRINOGEN: Fibrinogen: 535 mg/dL — ABNORMAL HIGH (ref 210–475)

## 2018-09-18 LAB — GLUCOSE, CAPILLARY
Glucose-Capillary: 144 mg/dL — ABNORMAL HIGH (ref 70–99)
Glucose-Capillary: 136 mg/dL — ABNORMAL HIGH (ref 70–99)
Glucose-Capillary: 105 mg/dL — ABNORMAL HIGH (ref 70–99)

## 2018-09-18 LAB — LACTATE DEHYDROGENASE: LDH: 235 U/L — ABNORMAL HIGH (ref 98–192)

## 2018-09-18 LAB — SEDIMENTATION RATE: Sed Rate: >140 mm/hr — ABNORMAL HIGH (ref 0–22)

## 2018-09-18 LAB — BRAIN NATRIURETIC PEPTIDE: B Natriuretic Peptide: 357.4 pg/mL — ABNORMAL HIGH (ref 0.0–100.0)

## 2018-09-18 LAB — PROCALCITONIN: Procalcitonin: 0.43 ng/mL

## 2018-09-18 LAB — MRSA PCR SCREENING: MRSA by PCR: NEGATIVE

## 2018-09-18 LAB — ABO/RH: ABO/RH(D): A POS

## 2018-09-18 LAB — D-DIMER, QUANTITATIVE: D-Dimer, Quant: 1.81 ug/mL-FEU — ABNORMAL HIGH (ref 0.00–0.50)

## 2018-09-18 LAB — TRIGLYCERIDES: Triglycerides: 142 mg/dL (ref <150)

## 2018-09-18 SURGERY — LIGATION OF ARTERIOVENOUS  FISTULA
Anesthesia: Choice

## 2018-09-18 MED ORDER — CALCITRIOL 0.5 MCG PO CAPS: 1.5000 ug | ORAL_CAPSULE | ORAL | Status: DC

## 2018-09-18 MED ORDER — ONDANSETRON HCL 4 MG PO TABS: 4.0000 mg | ORAL_TABLET | Freq: Four times a day (QID) | ORAL | Status: DC | PRN

## 2018-09-18 MED ORDER — CEFAZOLIN SODIUM-DEXTROSE 2-4 GM/100ML-% IV SOLN
INTRAVENOUS | Status: AC
  Filled 2018-09-18: qty 100

## 2018-09-18 MED ORDER — VITAMIN C 500 MG PO TABS
500.0000 mg | ORAL_TABLET | Freq: Every day | ORAL | Status: DC
  Administered 2018-09-19 – 2018-09-20 (×2): 500 mg via ORAL
  Filled 2018-09-18 (×2): qty 1

## 2018-09-18 MED ORDER — HEPARIN SODIUM (PORCINE) 5000 UNIT/ML IJ SOLN
7500.0000 [IU] | Freq: Three times a day (TID) | INTRAMUSCULAR | Status: DC
  Administered 2018-09-18 – 2018-09-20 (×7): 7500 [IU] via SUBCUTANEOUS
  Filled 2018-09-18 (×8): qty 2

## 2018-09-18 MED ORDER — ATORVASTATIN CALCIUM 80 MG PO TABS
80.0000 mg | ORAL_TABLET | Freq: Every day | ORAL | Status: DC
  Administered 2018-09-19: 80 mg via ORAL
  Filled 2018-09-18 (×3): qty 1
  Filled 2018-09-18: qty 2

## 2018-09-18 MED ORDER — HYDRALAZINE HCL 20 MG/ML IJ SOLN: 10.0000 mg | INTRAMUSCULAR | Status: DC | PRN

## 2018-09-18 MED ORDER — HEPARIN SODIUM (PORCINE) 5000 UNIT/ML IJ SOLN: 5000.0000 [IU] | Freq: Three times a day (TID) | INTRAMUSCULAR | Status: DC

## 2018-09-18 MED ORDER — ONDANSETRON HCL 4 MG/2ML IJ SOLN: 4.0000 mg | Freq: Four times a day (QID) | INTRAMUSCULAR | Status: DC | PRN

## 2018-09-18 MED ORDER — ALBUTEROL SULFATE HFA 108 (90 BASE) MCG/ACT IN AERS
2.0000 | INHALATION_SPRAY | Freq: Four times a day (QID) | RESPIRATORY_TRACT | Status: DC
  Administered 2018-09-19: 2 via RESPIRATORY_TRACT
  Filled 2018-09-18: qty 6.7

## 2018-09-18 MED ORDER — PIPERACILLIN-TAZOBACTAM 3.375 G IVPB 30 MIN
3.3750 g | Freq: Once | INTRAVENOUS | Status: AC
  Administered 2018-09-18: 3.375 g via INTRAVENOUS
  Filled 2018-09-18: qty 50

## 2018-09-18 MED ORDER — GUAIFENESIN-DM 100-10 MG/5ML PO SYRP
10.0000 mL | ORAL_SOLUTION | ORAL | Status: DC | PRN
  Filled 2018-09-18: qty 10

## 2018-09-18 MED ORDER — INSULIN ASPART 100 UNIT/ML ~~LOC~~ SOLN
0.0000 [IU] | SUBCUTANEOUS | Status: DC
  Administered 2018-09-18: 1 [IU] via SUBCUTANEOUS

## 2018-09-18 MED ORDER — PIPERACILLIN-TAZOBACTAM 3.375 G IVPB
3.3750 g | Freq: Two times a day (BID) | INTRAVENOUS | Status: DC
  Administered 2018-09-19 – 2018-09-20 (×3): 3.375 g via INTRAVENOUS
  Filled 2018-09-18 (×4): qty 50

## 2018-09-18 MED ORDER — SODIUM CHLORIDE 0.9 % IV SOLN
INTRAVENOUS | Status: DC
  Administered 2018-09-18 – 2018-09-19 (×2): via INTRAVENOUS

## 2018-09-18 MED ORDER — ZINC SULFATE 220 (50 ZN) MG PO CAPS
220.0000 mg | ORAL_CAPSULE | Freq: Every day | ORAL | Status: DC
  Administered 2018-09-19 – 2018-09-20 (×2): 220 mg via ORAL
  Filled 2018-09-18 (×2): qty 1

## 2018-09-18 MED ORDER — CEFAZOLIN SODIUM-DEXTROSE 2-4 GM/100ML-% IV SOLN: 2.0000 g | INTRAVENOUS | Status: DC

## 2018-09-18 MED ORDER — VANCOMYCIN HCL 10 G IV SOLR
2000.0000 mg | Freq: Once | INTRAVENOUS | Status: DC
  Filled 2018-09-18: qty 2000

## 2018-09-18 MED ORDER — VANCOMYCIN HCL 10 G IV SOLR
2500.0000 mg | Freq: Once | INTRAVENOUS | Status: AC
  Administered 2018-09-18: 2500 mg via INTRAVENOUS
  Filled 2018-09-18: qty 2500

## 2018-09-18 MED ORDER — FAMOTIDINE IN NACL 20-0.9 MG/50ML-% IV SOLN: 20.0000 mg | Freq: Two times a day (BID) | INTRAVENOUS | Status: DC

## 2018-09-18 MED ORDER — HYDROCOD POLST-CPM POLST ER 10-8 MG/5ML PO SUER: 5.0000 mL | Freq: Two times a day (BID) | ORAL | Status: DC | PRN

## 2018-09-18 NOTE — Consult Note (Addendum)
[facility] Renal Consultation Note  Indication for Consultation:  Management of ESRD/hemodialysis; anemia, hypertension/volume and secondary hyperparathyroidism  HPI: [name] is a 53 y.o. female with ESRD due to DM type 2  started HD in 2013. [place] [place] ,  transfer [place]  04/2017, HTN, hyperlipidemia, pericardial window (2004),CAD (non-obstructive 2019 coronary CT),,Adm [facility] 8/25-8/27/19 Migratorypolyarthritis/cervicalradiculopathy - Elevated CRP. Autoimmune serologies D/c on prednisone taper .  On HD TTS  At [facility] center last OP HD on schedule past Saturday .She was scheduled to have  to have her L UA AVF ligated today 2/2 Left thumb gangrene( Dr Oneida Alar [facility] OV noted 8/06) but On arrival this morning she was lethargic responding only to sternal rub and coughing.Noted she had a positive preop COVID test.Her surgery was canceled and admiited with Acute encephalopathy (noted unable to give any7 history per admit notes ).K  4.2 , ABG stat(pH 7.426, PO2 108, PCO2 48.3) hgb 8.1 (op hgb 7.8 last 2weeks  Max esa ,normal ts) Chest x-ray showing patchy atelectasis or pneumonia left lung base improved compared , CT hd pend.   We are consulted for HD /ESRD needs> Noted was dialyzing with Covid isolation at OP center [place]      Past Medical History:  Diagnosis Date  . Anemia   . Arthritis    "knees"  . CAD (coronary artery disease)    Nonobstructive on CT 2019  . ESRD (end stage renal disease) ([place])    Dialysis M/W/F  . Headache(784.0)   . High cholesterol   . Hypertension   . 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 Pericardial effusion   . Pneumonia   . PVD (peripheral vascular disease) ([place])    Right leg stent in [place].  (No records)  . Restless legs   . Sleep apnea    does not use Cpap  . Type II diabetes mellitus ([place])     Past Surgical History:  Procedure Laterality Date  . AV FISTULA PLACEMENT Left   . [place]; 1997   . EYE SURGERY Bilateral    cataract surgery  . PERICARDIAL WINDOW  02/2004   for pericardial effusion  . TUBAL LIGATION  05/1995      Family History  Problem Relation Age of Onset  . Cancer Brother   . Heart disease Father        Died age 19  . Diabetes Father   . Hyperlipidemia Father   . Hypertension Father   . Stroke Father   . Diabetes Mother   . Hypertension Mother   . Stroke Mother   . Dementia Mother   . Diabetes Sister   . Diabetes Brother   . Hypertension Sister   . Hypertension Brother       reports that she has been smoking cigarettes. She has a 10.00 pack-year smoking history. She has never used smokeless tobacco. She reports that she does not drink alcohol or use drugs.  No Known Allergies  Prior to Admission medications   Medication Sig Start Date End Date Taking? Authorizing Provider  amoxicillin-clavulanate (AUGMENTIN) 875-125 MG tablet Take 1 tablet by mouth 2 (two) times daily. 09/05/18  Yes Leandrew Koyanagi, MD  atorvastatin (LIPITOR) 80 MG tablet Take 1 tablet (80 mg total) by mouth daily. Take 1/2 tab daily for one week, then increase to the full tab. Patient taking differently: Take 80 mg by mouth daily.  06/01/17  Yes Philis Fendt  L, PA-C  b complex-vitamin c-folic acid (NEPHRO-VITE) 0.8 MG TABS tablet Take 1 tablet by mouth Every Tuesday,Thursday,and Saturday with dialysis.  07/17/18  Yes [provider]  ENBREL 50 MG/ML injection Inject 50 mg into the skin once a week.  07/06/18  Yes [provider]  gabapentin (NEURONTIN) 100 MG capsule Take 100 mg by mouth daily.  04/28/17  Yes [provider]  hydrOXYzine (ATARAX/VISTARIL) 25 MG tablet Take 25 mg by mouth daily.  05/12/17  Yes [provider]  Insulin Lispro Prot & Lispro (HUMALOG MIX 75/25 KWIKPEN) (75-25) 100 UNIT/ML Kwikpen Inject 30 Units into the skin 2 (two) times daily with a meal.    Yes [provider]  metoprolol succinate (TOPROL-XL) 25 MG 24 hr tablet  Take 1 tablet (25 mg total) by mouth daily. 06/01/17  Yes Tereasa Coop, PA-C  oxyCODONE (ROXICODONE) 15 MG immediate release tablet Take 15 mg by mouth 3 (three) times daily as needed for pain.  08/24/17  Yes [provider]  pramipexole (MIRAPEX) 0.5 MG tablet Take 0.5 mg by mouth daily.  05/18/17  Yes [provider]  AURYXIA 1 GM 210 MG(Fe) tablet Take 630 mg by mouth 3 (three) times daily with meals.  05/29/18   [provider]  diphenoxylate-atropine (LOMOTIL) 2.5-0.025 MG tablet Take 1 tablet by mouth 4 (four) times daily as needed for diarrhea or loose stools. 09/06/17   Jeannett Senior, PA-C      Results for orders placed or performed during the hospital encounter of 09/18/18 (from the past 48 hour(s))  Glucose, capillary     Status: Abnormal   Collection Time: 09/18/18 11:04 AM  Result Value Ref Range   Glucose-Capillary 144 (H) 70 - 99 mg/dL  ABO/Rh     Status: None (Preliminary result)   Collection Time: 09/18/18 12:10 PM  Result Value Ref Range   ABO/RH(D)      A POS Performed at [facility] [address] [place], [place] [address]   Type and screen Philip     Status: None (Preliminary result)   Collection Time: 09/18/18 12:10 PM  Result Value Ref Range   ABO/RH(D) PENDING    Antibody Screen PENDING    Sample Expiration      09/21/2018,2359 Performed at [facility], [place] [address], [place], [place] [address]   Glucose, capillary     Status: Abnormal   Collection Time: 09/18/18 12:18 PM  Result Value Ref Range   Glucose-Capillary 136 (H) 70 - 99 mg/dL  MRSA PCR Screening     Status: None   Collection Time: 09/18/18 12:20 PM   Specimen: Nasopharyngeal  Result Value Ref Range   MRSA by PCR NEGATIVE NEGATIVE    Comment:        The GeneXpert MRSA Assay (FDA approved for NASAL specimens only), is one component of a comprehensive MRSA colonization surveillance program. It is not intended to diagnose  MRSA infection nor to guide or monitor treatment for MRSA infections. Performed at [facility], [place] [address] [place], [place] [address]   Brain natriuretic peptide     Status: Abnormal   Collection Time: 09/18/18 12:27 PM  Result Value Ref Range   B Natriuretic Peptide 357.4 (H) 0.0 - 100.0 pg/mL    Comment: Performed at [facility] [address], [place], [place] [address]  C-reactive protein     Status: Abnormal   Collection Time: 09/18/18 12:27  PM  Result Value Ref Range   CRP 7.9 (H) <1.0 mg/dL    Comment: Performed at [facility] [address], [place], [place] [address]  Comprehensive metabolic panel     Status: Abnormal   Collection Time: 09/18/18 12:27 PM  Result Value Ref Range   Sodium 139 135 - 145 mmol/L   Potassium 4.4 3.5 - 5.1 mmol/L   Chloride 93 (L) 98 - 111 mmol/L   CO2 29 22 - 32 mmol/L   Glucose, Bld 145 (H) 70 - 99 mg/dL   BUN 37 (H) 6 - 20 mg/dL   Creatinine, Ser 8.98 (H) 0.44 - 1.00 mg/dL   Calcium 8.7 (L) 8.9 - 10.3 mg/dL   Total Protein 7.8 6.5 - 8.1 g/dL   Albumin 3.3 (L) 3.5 - 5.0 g/dL   AST 36 15 - 41 U/L   ALT 23 0 - 44 U/L   Alkaline Phosphatase 74 38 - 126 U/L   Total Bilirubin 0.8 0.3 - 1.2 mg/dL   GFR calc non Af Amer 4 (L) >60 mL/min   GFR calc Af Amer 5 (L) >60 mL/min   Anion gap 17 (H) 5 - 15    Comment: Performed at [facility], [place] [address] [place], [place] [address]  CBC with Differential/Platelet     Status: Abnormal   Collection Time: 09/18/18 12:27 PM  Result Value Ref Range   WBC 4.6 4.0 - 10.5 K/uL   RBC 2.55 (L) 3.87 - 5.11 MIL/uL   Hemoglobin 8.1 (L) 12.0 - 15.0 g/dL   HCT 26.0 (L) 36.0 - 46.0 %   MCV 102.0 (H) 80.0 - 100.0 fL   MCH 31.8 26.0 - 34.0 pg   MCHC 31.2 30.0 - 36.0 g/dL   RDW 17.2 (H) 11.5 - 15.5 %   Platelets 175 150 - 400 K/uL   nRBC 0.0 0.0 - 0.2 %   Neutrophils Relative % 69 %   Neutro Abs 3.2 1.7 - 7.7 K/uL   Lymphocytes Relative 16 %   Lymphs Abs 0.7 0.7 - 4.0 K/uL    Monocytes Relative 7 %   Monocytes Absolute 0.3 0.1 - 1.0 K/uL   Eosinophils Relative 8 %   Eosinophils Absolute 0.4 0.0 - 0.5 K/uL   Basophils Relative 0 %   Basophils Absolute 0.0 0.0 - 0.1 K/uL   Immature Granulocytes 0 %   Abs Immature Granulocytes 0.02 0.00 - 0.07 K/uL    Comment: Performed at [facility], [address], [place], [place] [address]  D-dimer, quantitative (not at [facility])     Status: Abnormal   Collection Time: 09/18/18 12:27 PM  Result Value Ref Range   D-Dimer, Quant 1.81 (H) 0.00 - 0.50 ug/mL-FEU    Comment: (NOTE) At the manufacturer cut-off of 0.50 ug/mL FEU, this assay has been documented to exclude PE with a sensitivity and negative predictive value of 97 to 99%.  At this time, this assay has not been approved by the FDA to exclude DVT/VTE. Results should be correlated with clinical presentation. Performed at [facility], [place] [address] [place], [place] [address]   Ferritin     Status: Abnormal   Collection Time: 09/18/18 12:27 PM  Result Value Ref Range   Ferritin 1,561 (H) 11 - 307 ng/mL    Comment: Performed at [facility], [place] [address] [place], [place] [address]  Fibrinogen     Status: Abnormal   Collection Time:  09/18/18 12:27 PM  Result Value Ref Range   Fibrinogen 535 (H) 210 - 475 mg/dL    Comment: Performed at [facility] [address] [place], [place] [address]  Lactate dehydrogenase     Status: Abnormal   Collection Time: 09/18/18 12:27 PM  Result Value Ref Range   LDH 235 (H) 98 - 192 U/L    Comment: Performed at [facility], [place] [address], [place], [place] [address]  Procalcitonin     Status: None   Collection Time: 09/18/18 12:27 PM  Result Value Ref Range   Procalcitonin 0.43 ng/mL    Comment:        Interpretation: PCT (Procalcitonin) <= 0.5 ng/mL: Systemic infection (sepsis) is not likely. Local bacterial infection is possible. (NOTE)       Sepsis PCT Algorithm           Lower  Respiratory Tract                                      Infection PCT Algorithm    ----------------------------     ----------------------------         PCT < 0.25 ng/mL                PCT < 0.10 ng/mL         Strongly encourage             Strongly discourage   discontinuation of antibiotics    initiation of antibiotics    ----------------------------     -----------------------------       PCT 0.25 - 0.50 ng/mL            PCT 0.10 - 0.25 ng/mL               OR       >80% decrease in PCT            Discourage initiation of                                            antibiotics      Encourage discontinuation           of antibiotics    ----------------------------     -----------------------------         PCT >= 0.50 ng/mL              PCT 0.26 - 0.50 ng/mL               AND        <80% decrease in PCT             Encourage initiation of                                             antibiotics       Encourage continuation           of antibiotics    ----------------------------     -----------------------------        PCT >= 0.50 ng/mL                  PCT > 0.50 ng/mL  AND         increase in PCT                  Strongly encourage                                      initiation of antibiotics    Strongly encourage escalation           of antibiotics                                     -----------------------------                                           PCT <= 0.25 ng/mL                                                 OR                                        > 80% decrease in PCT                                     Discontinue / Do not initiate                                             antibiotics Performed at [facility], [address] [place], [place] [address]   Sedimentation rate     Status: Abnormal   Collection Time: 09/18/18 12:27 PM  Result Value Ref Range   Sed Rate >140 (H) 0 - 22 mm/hr    Comment: Performed at [facility] [address], [place], [place] [address]  Triglycerides     Status: None   Collection Time: 09/18/18 12:27 PM  Result Value Ref Range   Triglycerides 142 <150 mg/dL    Comment: Performed at [facility], [place] [address] [place], [place]  [address]  Troponin I (High Sensitivity)     Status: Abnormal   Collection Time: 09/18/18 12:27 PM  Result Value Ref Range   Troponin I (High Sensitivity) 91 (H) <18 ng/L    Comment: (NOTE) Elevated high sensitivity troponin I (hsTnI) values and significant  changes across serial measurements may suggest ACS but many other  chronic and acute conditions are known to elevate hsTnI results.  Refer to the "Links" section for chest pain algorithms and additional  guidance. Performed at [facility], [place] [address], [place], [place] [address]   I-STAT 7, (LYTES, BLD GAS, ICA, H+H)     Status: Abnormal   Collection Time: 09/18/18 12:29 PM  Result Value Ref Range   pH, Arterial 7.426 7.350 - 7.450   pCO2 arterial 48.3 (H) 32.0 - 48.0 mmHg  pO2, Arterial 108.0 83.0 - 108.0 mmHg   Bicarbonate 32.0 (H) 20.0 - 28.0 mmol/L   TCO2 34 (H) 22 - 32 mmol/L   O2 Saturation 98.0 %   Acid-Base Excess 7.0 (H) 0.0 - 2.0 mmol/L   Sodium 137 135 - 145 mmol/L   Potassium 4.2 3.5 - 5.1 mmol/L   Calcium, Ion 1.06 (L) 1.15 - 1.40 mmol/L   HCT 22.0 (L) 36.0 - 46.0 %   Hemoglobin 7.5 (L) 12.0 - 15.0 g/dL   Patient temperature 97.2 F    Collection site RADIAL, ALLEN'S TEST ACCEPTABLE    Drawn by Operator    Sample type ARTERIAL     ROS: Unable to obtain with Pt AMS  Physical Exam: Vitals:   09/18/18 1400 09/18/18 1430  BP: 113/64 112/62  Pulse: (!) 53 (!) 55  Resp: 17 16  Temp:    SpO2: 91% 92%     EXam:  Noted Pt is COVID  Positive / PE not done by me  But observed from [facility] notes    Dialysis Orders: Center: [facility]   on TTS  . EDW 110kg ( past week meeting edw and slightly below)  HD Bath 2k/2ca  Time 4hrs Heparin 10,000. Access LUA AVF       Calcitriol 1.5 mcg PO /HD  MIrcera 150 /HD q 2wks (last on 8/13)    Other Parsabiv  2.86mcg  q hd   OP HGB 7.8 last 2 wks with TFS 37%   Assessment/Plan 1. Acute Encephalopathy - wu per admit/ ?COVID Related / Infectous / Meds ?  2. ESRD -  HD TTS , No acute HD needs today, plan isolated COVID HD Protocol tomorrow  3. COVID -19 Infection - Positive per op testing/ RX  per admit team  4. Left Finger Gangrene - plans per [facility]/ ligation avf cancelled 2/2 ams  5. Hypertension/volume  - no sign of vol overload , bp currently stable  6. Anemia of ESRD   - HGB 8.1 (has max esa 8/13 ) op hgb 7.8 last 2wks 7. Metabolic bone disease -  Calcitriol on hd , (parsabiv not available @[facility] ) Ca 8.7 , binders when eating  8. Abnormal EKG: Patient noted to be bradycardic with blood pressure stable.  EKG showing premature atrial complexes and QTC of 502.- per admit rx 9. DM type 2 - per admit  10. HO Chronic Pain - was on oxycodone as op/ rx per admit   Ernest Haber, PA-C [place] [phone] 09/18/2018, 2:43 PM   Pt seen, examined and agree w A/P as above.  Kelly Splinter  MD 09/18/2018, 5:07 PM

## 2018-09-18 NOTE — Progress Notes (Signed)
Notified Dr. Roanna Banning that patient was having a difficult time staying awake when asking questions.  Dr. Oneida Alar notified came to bedside and cancelled surgery for today.  Patient is to be admitted.  Nurse at bedside with patient.

## 2018-09-18 NOTE — Progress Notes (Signed)
Pharmacy Antibiotic Note  [name] is a 53 y.o. female admitted on 09/18/2018 for ligation of left arm AVF and catheter placement.  Found to be lethargic so surgery was canceled and patient was admitted to the ICU.  She tested positive for covid pre-op.  Pharmacy has been consulted for Zosyn dosing for gangrenous left finger.  Patient has ESRD, is afebrile with WNL WBC.  PCT 0.43.  Plan: Zosyn EID 3.375gm IV Q12H Monitor HD schedule, clinical progress  Height: 5\' 9"  (175.3 cm) Weight: 242 lb 3.3 oz (109.9 kg) IBW/kg (Calculated) : 66.2  Temp (24hrs), Avg:98.3 F (36.8 C), Min:97.9 F (36.6 C), Max:98.6 F (37 C)  Recent Labs  Lab 09/18/18 1227  WBC 4.6  CREATININE 8.98*    Estimated Creatinine Clearance: 9.5 mL/min (A) (by C-G formula based on SCr of 8.98 mg/dL (H)).    No Known Allergies   Vanc x1 Zosyn 8/17 >> Augmentin PTA  8/14 covid (preop test) - positive 8/17 MRSA PCR -  8/17 BCx -   [name] D. [name], PharmD, BCPS, [place] 09/18/2018, 1:46 PM

## 2018-09-18 NOTE — H&P (Signed)
History and Physical    [name] UDJ:[id number] DOB: 14-Feb-1965 DOA: 09/18/2018  Referring MD/NP/PA: Ruta Hinds, MD PCP: Sandi Mariscal, MD  Patient coming from: Preop  Chief Complaint: Lethargy  I have personally briefly reviewed patient's old medical records in [place]   HPI: [name] is a 53 y.o. female with medical history significant of hypertension, hyperlipidemia, ESRD on hemodialysis, PVD, tobacco use, and chronic back pain; who presented initially for ligation of left arm AV fistula and catheter placement for steal from left arm AV fistula.  History is obtained from review of records as she is currently altered and unable to provide any of her own.  Apparently patient was COVID positive on preop screening 3 days ago.  Photos were taken of the gangrenous left first digit during her last appointment Dr. Oneida Alar office on 8/6.  Patient was on antibiotics Augmentin patient was noted to be lethargic responding only to sternal rub and with cough.  Consulted by Dr. Oneida Alar to admit for further work-up given that she is COVID positive.  Most recent lab work from 1 month ago.   ED Course: As seen above  Review of Systems  Unable to perform ROS: Mental status change    Past Medical History:  Diagnosis Date  . Anemia   . Arthritis    "knees"  . CAD (coronary artery disease)    Nonobstructive on CT 2019  . ESRD (end stage renal disease) ([place])    Dialysis M/W/F  . Headache(784.0)   . High cholesterol   . Hypertension   . 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 Pericardial effusion   . Pneumonia   . PVD (peripheral vascular disease) ([place])    Right leg stent in [place].  (No records)  . Restless legs   . Sleep apnea    does not use Cpap  . Type II diabetes mellitus ([place])     Past Surgical History:  Procedure Laterality Date  . AV FISTULA PLACEMENT Left   . [place]; 1997  . EYE SURGERY Bilateral    cataract surgery   . PERICARDIAL WINDOW  02/2004   for pericardial effusion  . TUBAL LIGATION  05/1995     reports that she has been smoking cigarettes. She has a 10.00 pack-year smoking history. She has never used smokeless tobacco. She reports that she does not drink alcohol or use drugs.  No Known Allergies  Family History  Problem Relation Age of Onset  . Cancer Brother   . Heart disease Father        Died age 75  . Diabetes Father   . Hyperlipidemia Father   . Hypertension Father   . Stroke Father   . Diabetes Mother   . Hypertension Mother   . Stroke Mother   . Dementia Mother   . Diabetes Sister   . Diabetes Brother   . Hypertension Sister   . Hypertension Brother     Prior to Admission medications   Medication Sig Start Date End Date Taking? Authorizing Provider  amoxicillin-clavulanate (AUGMENTIN) 875-125 MG tablet Take 1 tablet by mouth 2 (two) times daily. 09/05/18  Yes Leandrew Koyanagi, MD  atorvastatin (LIPITOR) 80 MG tablet Take 1 tablet (80 mg total) by mouth daily. Take 1/2 tab daily for one week, then increase to the full tab. Patient taking differently: Take 80 mg by mouth daily.  06/01/17  Yes Tereasa Coop, PA-C  b complex-vitamin c-folic acid (NEPHRO-VITE) 0.8 MG TABS tablet Take 1 tablet by mouth Every Tuesday,Thursday,and Saturday with dialysis.  07/17/18  Yes [provider]  ENBREL 50 MG/ML injection Inject 50 mg into the skin once a week.  07/06/18  Yes [provider]  gabapentin (NEURONTIN) 100 MG capsule Take 100 mg by mouth daily.  04/28/17  Yes [provider]  hydrOXYzine (ATARAX/VISTARIL) 25 MG tablet Take 25 mg by mouth daily.  05/12/17  Yes [provider]  Insulin Lispro Prot & Lispro (HUMALOG MIX 75/25 KWIKPEN) (75-25) 100 UNIT/ML Kwikpen Inject 30 Units into the skin 2 (two) times daily with a meal.    Yes [provider]  metoprolol succinate (TOPROL-XL) 25 MG 24 hr tablet Take 1 tablet (25 mg total) by mouth daily. 06/01/17   Yes Tereasa Coop, PA-C  oxyCODONE (ROXICODONE) 15 MG immediate release tablet Take 15 mg by mouth 3 (three) times daily as needed for pain.  08/24/17  Yes [provider]  pramipexole (MIRAPEX) 0.5 MG tablet Take 0.5 mg by mouth daily.  05/18/17  Yes [provider]  AURYXIA 1 GM 210 MG(Fe) tablet Take 630 mg by mouth 3 (three) times daily with meals.  05/29/18   [provider]  diphenoxylate-atropine (LOMOTIL) 2.5-0.025 MG tablet Take 1 tablet by mouth 4 (four) times daily as needed for diarrhea or loose stools. 09/06/17   Jeannett Senior, PA-C    Physical Exam:  Constitutional: Patient is very lethargic, and we will wait temporarily to sternal rub. Vitals:   09/18/18 0931 09/18/18 1007  BP: (!) 111/54   Pulse: 60   Temp:  98.6 F (37 C)  TempSrc:  Oral  Weight:  109.9 kg  Height:  5\' 9"  (1.753 m)   Eyes: PERRL, lids and conjunctivae normal ENMT: Mucous membranes are dry. Posterior pharynx clear of any exudate or lesions.  Neck: normal, supple, no masses, no thyromegaly Respiratory: clear to auscultation bilaterally, no wheezing, no crackles. Normal respiratory effort. No accessory muscle use.  Cardiovascular: Regular rate and rhythm, no murmurs / rubs / gallops.  Trace lower extremity edema. 2+ pedal pulses. No carotid bruits.  Left upper extremity AV fistula. Abdomen: no tenderness, no masses palpated. No hepatosplenomegaly. Bowel sounds positive.  Musculoskeletal: no clubbing / cyanosis. No joint deformity upper and lower extremities. Good ROM, no contractures. Normal muscle tone.  Skin: First digit of the left hand currently wrapped Neurologic: CN 2-12 grossly intact.  Patient able to move all extremities. Psychiatric: Lethargic unable to arouse easily.    Labs on Admission: I have personally reviewed following labs and imaging studies  CBC: No results for input(s): WBC, NEUTROABS, HGB, HCT, MCV, PLT in the last 168 hours. Basic Metabolic  Panel: No results for input(s): NA, K, CL, CO2, GLUCOSE, BUN, CREATININE, CALCIUM, MG, PHOS in the last 168 hours. GFR: CrCl cannot be calculated (Patient's most recent lab result is older than the maximum 21 days allowed.). Liver Function Tests: No results for input(s): AST, ALT, ALKPHOS, BILITOT, PROT, ALBUMIN in the last 168 hours. No results for input(s): LIPASE, AMYLASE in the last 168 hours. No results for input(s): AMMONIA in the last 168 hours. Coagulation Profile: No results for input(s): INR, PROTIME in the last 168 hours. Cardiac Enzymes: No results for input(s): CKTOTAL, CKMB, CKMBINDEX, TROPONINI in the last 168 hours. BNP (last 3 results) No results for input(s): PROBNP in the last 8760 hours. HbA1C: No results for input(s): HGBA1C in the last 72  hours. CBG: No results for input(s): GLUCAP in the last 168 hours. Lipid Profile: No results for input(s): CHOL, HDL, LDLCALC, TRIG, CHOLHDL, LDLDIRECT in the last 72 hours. Thyroid Function Tests: No results for input(s): TSH, T4TOTAL, FREET4, T3FREE, THYROIDAB in the last 72 hours. Anemia Panel: No results for input(s): VITAMINB12, FOLATE, FERRITIN, TIBC, IRON, RETICCTPCT in the last 72 hours. Urine analysis:    Component Value Date/Time   COLORURINE YELLOW 07/22/2011 1526   APPEARANCEUR CLOUDY (A) 07/22/2011 1526   LABSPEC 1.022 07/22/2011 1526   PHURINE 5.5 07/22/2011 1526   GLUCOSEU 100 (A) 07/22/2011 1526   HGBUR MODERATE (A) 07/22/2011 1526   BILIRUBINUR NEGATIVE 07/22/2011 1526   BILIRUBINUR NEG 07/19/2011 1202   KETONESUR NEGATIVE 07/22/2011 1526   PROTEINUR >300 (A) 07/22/2011 1526   UROBILINOGEN 0.2 07/22/2011 1526   NITRITE NEGATIVE 07/22/2011 1526   LEUKOCYTESUR TRACE (A) 07/22/2011 1526   Sepsis Labs: Recent Results (from the past 240 hour(s))  SARS CORONAVIRUS 2 Nasal Swab Aptima Multi Swab     Status: Abnormal   Collection Time: 09/14/18 12:19 PM   Specimen: Aptima Multi Swab; Nasal Swab  Result  Value Ref Range Status   SARS Coronavirus 2 POSITIVE (A) NEGATIVE Final    Comment: (NOTE) SARS-CoV-2 target nucleic acids are DETECTED. The SARS-CoV-2 RNA is generally detectable in upper and lower respiratory specimens during the acute phase of infection. Positive results are indicative of active infection with SARS-CoV-2. Clinical  correlation with patient history and other diagnostic information is necessary to determine patient infection status. Positive results do  not rule out bacterial infection or co-infection with other viruses. The expected result is Negative. Fact Sheet for Patients: SugarRoll.be Fact Sheet for Healthcare Providers: https://www.woods-mathews.com/ This test is not yet approved or cleared by the [place] FDA and  has been authorized for detection and/or diagnosis of SARS-CoV-2 by FDA under an Emergency Use Authorization (EUA). This EUA will remain  in effect (meaning this test can be used) for the duration of the COVID-19 declaration under Section 564(b)(1) of the Act, 21 U.S.C.  section 360bbb-3(b)(1), unless the authorization is terminated or revoked sooner. Performed at [facility], [place] [address] [place], [place] [address]      Radiological Exams on Admission: Dg Chest Port 1 View  Result Date: 09/18/2018 CLINICAL DATA:  Altered mental status. COVID-19 positive. EXAM: PORTABLE CHEST 1 VIEW COMPARISON:  07/09/2017. FINDINGS: Stable enlarged cardiac silhouette. Stable prominence of the interstitial markings. Patchy density in the left lower lung zone with improvement. Resolved patchy opacity at the right lung base. Unremarkable bones. IMPRESSION: 1. Patchy atelectasis or pneumonia at the left lung base, improved compared to the previous examination. 2. Stable cardiomegaly and mild chronic interstitial lung disease. Electronically Signed   By: Claudie Revering M.D.   On: 09/18/2018 11:36    EKG:  Independently reviewed.  Sinus bradycardia at 58 bpm with RBBB, premature atrial complexes, and QTc 502  Assessment/Plan COVID-19 infection: Acute.  Patient presents after recently being found COVID -19 positive 3 days ago.  Chest x-ray showing patchy atelectasis or pneumonia left lung base improved compared to previous and stable cardiomegaly. -Admit to a progressive bed -COVID 19 admission order set utilized with precautions -Airborne precautions -Continuous pulse oximetry with nasal cannula oxygen to keep O2 saturations greater than 90%.  -Check inflammatory markers now -Albuterol inhaler every 6 hours  -Daily inflammatory markers   Acute encephalopathy: Patient currently altered and unable to give adequate history.  Unclear  cause of symptoms at this time.  Differential includes underlying infection versus pain medications versus hypercapnia. -Neurochecks -Check CT scan of the brain without contrast -Check ABG stat(pH 7.426, PO2 108, PCO2 48.3)  -Hold home pain medication of oxycodone.  Gangrene of the left finger: Patient has been on antibiotics of Augmentin in the outpatient setting.  Previous x-rays of the left thumb did not show any clear signs of osteomyelitis. -Check blood cultures  -Will start vancomycin and Zosyn empirically  -May warrant consults to hand surgery   Left brachial AV fistula: Patient was scheduled to have ligation of the left brachial AV fistula today.  However, procedure canceled due to patient's altered mental status. -Vascular surgery to follow along.  Abnormal EKG: Patient noted to be bradycardic with blood pressure stable.  EKG showing premature atrial complexes and QTC of 502. -Recheck EKG in a.m.  ESRD on HD: Patient reportedly dialyzes on Tuesday, Thursday, Saturday. -Nephrology consulted for possible need of hemodialysis during her hospitalization.  Diabetes mellitus type 2: Last available hemoglobin A1c noted to be 10.7 on 06/01/2017. -Hypoglycemic  protocols -CBGs q. AC with sensitive SSI  -Will restart home regimen once more alert and tolerating a diet.  Anemia of chronic disease: Hemoglobin came back at 8.1.  Baseline hemoglobin previously around 9 to 10 g/dL. -Recheck BMP in a.m.   Essential hypertension: Currently well controlled.  -Will continue home blood pressure medications when more alert  Chronic pain  -Holding patient's home medications of oxycodone at this time  Hyperlipidemia -Continue atorvastatin once more alert    DVT prophylaxis: heparin Code Status: full Family Communication: No family present at bedside Disposition Plan: Likely discharge home in 2 to 3 days once medically stable Consults called: Vascular surgery Admission status: Inpatient  Norval Morton MD [facility] Pager [phone]   If 7PM-7AM, please contact night-coverage [email or web address] Password [facility]  09/18/2018, 10:46 AM

## 2018-09-18 NOTE — Progress Notes (Signed)
CBG 144 at 1104.  Will continue to monitor.

## 2018-09-18 NOTE — Progress Notes (Signed)
[facility]  Subjective  - pt lethargic not following commands, coughing   Objective     98.6 F (37 C) (Oral)     No intake or output data in the 24 hours ending 09/18/18 1015   Assessment/Planning: Pt was scheduled for ligation of left arm AVF and catheter placement for steal from left arm AVF.  She has a gangrenous left finger ( see pictures from my office note)  She had a positive preop COVID test  On arrival this morning she is lethargic responding only to sternal rub and coughing  Since her case is not emergent and more urgent will have pt admitted to hospitalist service until she is more stable and we can decipher if this is deterioration from [place] or her hand.  Original intent was to do procedure with local to avoid airway issues but she is so lethargic that she might require intubation if sedated.  She was very alert and coversant at her office visit so this is a dramatic change from baseline.  I spoke with Dr [place] Bells and since this pt is on chronic hemodialysis she will need admission to [facility] rather than [facility].  I will follow as consult and help determine timing of her procedure.  Operation for today is cancelled.  Ruta Hinds 09/18/2018 10:15 AM --  Laboratory Lab Results: No results for input(s): WBC, HGB, HCT, PLT in the last 72 hours. BMET No results for input(s): NA, K, CL, CO2, GLUCOSE, BUN, CREATININE, CALCIUM in the last 72 hours.  COAG Lab Results  Component Value Date   INR 0.90 01/30/2011   No results found for: PTT

## 2018-09-19 LAB — CBC WITH DIFFERENTIAL/PLATELET
Abs Immature Granulocytes: 0.02 10*3/uL (ref 0.00–0.07)
Basophils Absolute: 0 10*3/uL (ref 0.0–0.1)
Basophils Relative: 0 %
Eosinophils Absolute: 0.3 10*3/uL (ref 0.0–0.5)
Eosinophils Relative: 7 %
HCT: 24.4 % — ABNORMAL LOW (ref 36.0–46.0)
Hemoglobin: 7.5 g/dL — ABNORMAL LOW (ref 12.0–15.0)
Immature Granulocytes: 0 %
Lymphocytes Relative: 19 %
Lymphs Abs: 0.9 10*3/uL (ref 0.7–4.0)
MCH: 31.4 pg (ref 26.0–34.0)
MCHC: 30.7 g/dL (ref 30.0–36.0)
MCV: 102.1 fL — ABNORMAL HIGH (ref 80.0–100.0)
Monocytes Absolute: 0.3 10*3/uL (ref 0.1–1.0)
Monocytes Relative: 5 %
Neutro Abs: 3.4 10*3/uL (ref 1.7–7.7)
Neutrophils Relative %: 69 %
Platelets: 152 10*3/uL (ref 150–400)
RBC: 2.39 MIL/uL — ABNORMAL LOW (ref 3.87–5.11)
RDW: 17.2 % — ABNORMAL HIGH (ref 11.5–15.5)
WBC: 4.9 10*3/uL (ref 4.0–10.5)
nRBC: 0 % (ref 0.0–0.2)

## 2018-09-19 LAB — COMPREHENSIVE METABOLIC PANEL
ALT: 17 U/L (ref 0–44)
AST: 28 U/L (ref 15–41)
Albumin: 2.9 g/dL — ABNORMAL LOW (ref 3.5–5.0)
Alkaline Phosphatase: 72 U/L (ref 38–126)
Anion gap: 16 — ABNORMAL HIGH (ref 5–15)
BUN: 40 mg/dL — ABNORMAL HIGH (ref 6–20)
CO2: 27 mmol/L (ref 22–32)
Calcium: 8.2 mg/dL — ABNORMAL LOW (ref 8.9–10.3)
Chloride: 94 mmol/L — ABNORMAL LOW (ref 98–111)
Creatinine, Ser: 10.18 mg/dL — ABNORMAL HIGH (ref 0.44–1.00)
GFR calc Af Amer: 4 mL/min — ABNORMAL LOW (ref >60)
GFR calc non Af Amer: 4 mL/min — ABNORMAL LOW (ref >60)
Glucose, Bld: 96 mg/dL (ref 70–99)
Potassium: 4.5 mmol/L (ref 3.5–5.1)
Sodium: 137 mmol/L (ref 135–145)
Total Bilirubin: 1 mg/dL (ref 0.3–1.2)
Total Protein: 7.2 g/dL (ref 6.5–8.1)

## 2018-09-19 LAB — GLUCOSE, CAPILLARY
Glucose-Capillary: 102 mg/dL — ABNORMAL HIGH (ref 70–99)
Glucose-Capillary: 115 mg/dL — ABNORMAL HIGH (ref 70–99)
Glucose-Capillary: 115 mg/dL — ABNORMAL HIGH (ref 70–99)
Glucose-Capillary: 250 mg/dL — ABNORMAL HIGH (ref 70–99)
Glucose-Capillary: 268 mg/dL — ABNORMAL HIGH (ref 70–99)
Glucose-Capillary: 343 mg/dL — ABNORMAL HIGH (ref 70–99)
Glucose-Capillary: 82 mg/dL (ref 70–99)
Glucose-Capillary: 88 mg/dL (ref 70–99)
Glucose-Capillary: 96 mg/dL (ref 70–99)

## 2018-09-19 LAB — HEMOGLOBIN A1C
Hgb A1c MFr Bld: 8.7 % — ABNORMAL HIGH (ref 4.8–5.6)
Mean Plasma Glucose: 202.99 mg/dL

## 2018-09-19 LAB — POCT I-STAT 4, (NA,K, GLUC, HGB,HCT)
Glucose, Bld: 161 mg/dL — ABNORMAL HIGH (ref 70–99)
HCT: 26 % — ABNORMAL LOW (ref 36.0–46.0)
Hemoglobin: 8.8 g/dL — ABNORMAL LOW (ref 12.0–15.0)
Potassium: 4.2 mmol/L (ref 3.5–5.1)
Sodium: 137 mmol/L (ref 135–145)

## 2018-09-19 LAB — D-DIMER, QUANTITATIVE: D-Dimer, Quant: 2.51 ug/mL-FEU — ABNORMAL HIGH (ref 0.00–0.50)

## 2018-09-19 LAB — C-REACTIVE PROTEIN: CRP: 8.7 mg/dL — ABNORMAL HIGH (ref <1.0)

## 2018-09-19 LAB — TRIGLYCERIDES: Triglycerides: 153 mg/dL — ABNORMAL HIGH (ref <150)

## 2018-09-19 LAB — HEPATITIS B SURFACE ANTIGEN: Hepatitis B Surface Ag: NEGATIVE

## 2018-09-19 LAB — FERRITIN: Ferritin: 1721 ng/mL — ABNORMAL HIGH (ref 11–307)

## 2018-09-19 LAB — CK: Total CK: 245 U/L — ABNORMAL HIGH (ref 38–234)

## 2018-09-19 LAB — PHOSPHORUS: Phosphorus: 6.5 mg/dL — ABNORMAL HIGH (ref 2.5–4.6)

## 2018-09-19 LAB — GLUCOSE 6 PHOSPHATE DEHYDROGENASE
G6PDH: 9.2 U/g{Hb} (ref 4.6–13.5)
Hemoglobin: 8.1 g/dL — ABNORMAL LOW (ref 11.1–15.9)

## 2018-09-19 LAB — MAGNESIUM: Magnesium: 2.3 mg/dL (ref 1.7–2.4)

## 2018-09-19 LAB — INTERLEUKIN-6, PLASMA: Interleukin-6, Plasma: 38 pg/mL — ABNORMAL HIGH (ref 0.0–12.2)

## 2018-09-19 MED ORDER — VANCOMYCIN HCL IN DEXTROSE 1-5 GM/200ML-% IV SOLN
1000.0000 mg | INTRAVENOUS | Status: DC
  Administered 2018-09-20: 1000 mg via INTRAVENOUS
  Filled 2018-09-19: qty 200

## 2018-09-19 MED ORDER — CALCITRIOL 0.5 MCG PO CAPS
1.5000 ug | ORAL_CAPSULE | ORAL | Status: DC
  Administered 2018-09-20: 1.5 ug via ORAL
  Filled 2018-09-19: qty 3

## 2018-09-19 MED ORDER — DEXAMETHASONE SODIUM PHOSPHATE 10 MG/ML IJ SOLN
6.0000 mg | Freq: Every day | INTRAMUSCULAR | Status: DC
  Administered 2018-09-19 – 2018-09-20 (×2): 6 mg via INTRAVENOUS
  Filled 2018-09-19 (×2): qty 0.6

## 2018-09-19 MED ORDER — CHLORHEXIDINE GLUCONATE CLOTH 2 % EX PADS: 6.0000 | MEDICATED_PAD | Freq: Every day | CUTANEOUS | Status: DC

## 2018-09-19 MED ORDER — VANCOMYCIN HCL IN DEXTROSE 1-5 GM/200ML-% IV SOLN
1000.0000 mg | Freq: Once | INTRAVENOUS | Status: AC
  Administered 2018-09-19: 1000 mg via INTRAVENOUS
  Filled 2018-09-19: qty 200

## 2018-09-19 MED ORDER — VANCOMYCIN HCL IN DEXTROSE 1-5 GM/200ML-% IV SOLN
1000.0000 mg | INTRAVENOUS | Status: DC
  Filled 2018-09-19: qty 200

## 2018-09-19 MED ORDER — ORAL CARE MOUTH RINSE
15.0000 mL | Freq: Two times a day (BID) | OROMUCOSAL | Status: DC
  Administered 2018-09-20: 15 mL via OROMUCOSAL

## 2018-09-19 MED ORDER — INSULIN ASPART 100 UNIT/ML ~~LOC~~ SOLN
6.0000 [IU] | Freq: Once | SUBCUTANEOUS | Status: AC
  Administered 2018-09-20: 6 [IU] via SUBCUTANEOUS

## 2018-09-19 MED ORDER — CALCITRIOL 0.5 MCG PO CAPS
1.5000 ug | ORAL_CAPSULE | Freq: Once | ORAL | Status: AC
  Administered 2018-09-19: 1.5 ug via ORAL
  Filled 2018-09-19: qty 3

## 2018-09-19 MED ORDER — INSULIN ASPART 100 UNIT/ML ~~LOC~~ SOLN
0.0000 [IU] | Freq: Three times a day (TID) | SUBCUTANEOUS | Status: DC
  Administered 2018-09-20: 1 [IU] via SUBCUTANEOUS
  Administered 2018-09-20: 3 [IU] via SUBCUTANEOUS

## 2018-09-19 MED ORDER — HEPARIN SODIUM (PORCINE) 1000 UNIT/ML DIALYSIS
10000.0000 [IU] | Freq: Once | INTRAMUSCULAR | Status: DC
  Filled 2018-09-19: qty 10

## 2018-09-19 NOTE — Progress Notes (Signed)
Renal Navigator spoke with clinic manager at OP HD clinic/[facility] who states patient has already been arranged at [facility] + isolation shift, which runs on 3rd shift MWF and [facility] has been set up for transportation. Patient will need to resume this schedule at discharge.  Alphonzo Cruise, [place] Renal Navigator [phone]

## 2018-09-19 NOTE — Progress Notes (Signed)
Inpatient Diabetes Program Recommendations  AACE/ADA: New Consensus Statement on Inpatient Glycemic Control   Target Ranges:  Prepandial:   less than 140 mg/dL      Peak postprandial:   less than 180 mg/dL (1-2 hours)      Critically ill patients:  140 - 180 mg/dL   Results for KIRSTA, PROBERT (MRN [id number]) as of 09/19/2018 09:41  Ref. Range 09/18/2018 11:04 09/18/2018 12:18 09/18/2018 15:31 09/18/2018 20:49 09/19/2018 00:01 09/19/2018 03:02 09/19/2018 08:40  Glucose-Capillary Latest Ref Range: 70 - 99 mg/dL 144 (H) 136 (H) 105 (H) 102 (H) 96 82 88  Results for FORTUNATA, BETTY (MRN [id number]) as of 09/19/2018 09:41  Ref. Range 06/01/2017 09:59 09/19/2018 08:33  Hemoglobin A1C Latest Ref Range: 4.8 - 5.6 % 10.7 (H) 8.7 (H)   Review of Glycemic Control  Diabetes history: DM2 Outpatient Diabetes medications: Humalog 75/25 30 units BID Current orders for Inpatient glycemic control: Novolog 0-9 units Q4H  Inpatient Diabetes Program Recommendations:   HbgA1C:  A1C 8.7% on 09/19/18 indicating an average glucose of 203 mg/dl over the past 2-3 months.  NOTE: Noted consult per COVID Glycemic order set. Chart reviewed. CBGs well controlled with Novolog 0-9 units Q4H and patient has only received Novolog 1 unit since being admitted. Fasting glucose 88 mg/dl this morning.  Agree with current orders.  Spoke with patient over the phone regarding DM control and outpatient DM medications. Patient reports that she is taking Humalog 75/25 30 units BID with breakfast and supper. Patient states that she consistently takes 75/25 as prescribed and that she does not skip or adjust the dose at all. Patient admits she may forget to take a dose from time to time but that is rare. Patient reports that her glucose is typically mid 100's mg/dl to 180's mg/dl in the morning when she checks her glucose. Discussed current A1C 8.7% on 09/19/18 and informed patient that A1C indicates an average glucose of 203 mg/dl. Patient states  that current A1C is an improvement from her last A1C. Patient reports that she has been sick with diarrhea for about 1 month and some of her medications were changed and she is not having diarrhea like she was. She is hopeful that her glucose will be even better once she is feeling better. Discussed current insulin orders with just Novolog correction. Explained that currently she is not ordered any basal insulin but if glucose begins to increase, attending MD may need to add basal insulin. Patient reports that her appetite is "alright" and she is able to eat meals. Patient verbalized understanding of information and she states that she has no questions at this time.  Thanks, Barnie Alderman, RN, MSN, CDE Diabetes Coordinator Inpatient Diabetes Program [phone] (Team Pager from 8am to 5pm)

## 2018-09-19 NOTE — Progress Notes (Signed)
PROGRESS NOTE    [name]  NAT:[id number] DOB: 13-Feb-1965 DOA: 09/18/2018 PCP: Sandi Mariscal, MD     Brief Narrative:  [name] is a 53 y.o. female with medical history significant of hypertension, hyperlipidemia, ESRD on hemodialysis, PVD, tobacco use, and chronic back pain; who presented initially for ligation of left arm AV fistula and catheter placement for steal from left arm AV fistula.  History is obtained from review of records as she is currently altered and unable to provide any of her own.  Apparently patient was COVID positive on preop screening 3 days ago.  Photos were taken of the gangrenous left first digit during her last appointment Dr. Oneida Alar office on 8/6.  Patient was on antibiotics Augmentin patient was noted to be lethargic responding only to sternal rub and with cough.  Consulted by Dr. Oneida Alar to admit for further work-up given that she is COVID positive.  New events last 24 hours / Subjective: Patient mentation improved back to baseline.  According to nursing, she was completely unarousable last night, then woke up around midnight and was back to her baseline levels.  According to nursing, patient had told them that sometimes she does this where she sleeps and does not wake up for a while.  No new complaints on examination, patient is sitting at the side of the bed, had finished eating her breakfast.  Assessment & Plan:   Principal Problem:   COVID-19 virus infection Active Problems:   Type II diabetes mellitus with complication, uncontrolled ([place])   Hyperlipidemia, mixed   ESRD on dialysis ([place])   Anemia   Acute metabolic encephalopathy   Gangrene ([place])    COVID-19 -Incidental positive on her preop screening -Chest x-ray showing patchy atelectasis or pneumonia left lung base improved compared to previous and stable cardiomegaly. 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 Burney Gauze (hand surgery) on 8/27  Left brachial AV fistula -Patient was scheduled as a outpatient to have ligation of the left brachial AV fistula on 8/17, procedure canceled due to altered mentation -Per vascular surgery  ESRD on dialysis TTS -Appreciate nephrology  Type 2 diabetes with hyperglycemia -SSI   Hyperlipidemia -Continue Lipitor   DVT prophylaxis: Subcutaneous heparin Code Status: Full code Family Communication: None Disposition Plan: Pending further plan by vascular surgery, for initial issue of altered mentation has now resolved.  Timing of left brachial AV fistula ligation per vascular surgery   Consultants:   Vascular surgery  Procedures:   None  Antimicrobials:  Anti-infectives (From admission, onward)   Start     Dose/Rate Route Frequency Ordered Stop   09/19/18 0300  piperacillin-tazobactam (ZOSYN) IVPB 3.375 g     3.375 g 12.5 mL/hr over 240 Minutes Intravenous Every 12 hours 09/18/18 1351     09/18/18 1330  vancomycin (VANCOCIN) 2,000 mg in sodium chloride 0.9 % 500 mL IVPB  Status:  Discontinued     2,000 mg 250 mL/hr over 120 Minutes Intravenous  Once 09/18/18 1319 09/18/18 1323  09/18/18 1330  vancomycin (VANCOCIN) 2,500 mg in sodium chloride 0.9 % 500 mL IVPB     2,500 mg 250 mL/hr over 120 Minutes Intravenous  Once 09/18/18 1323 09/18/18 1622   09/18/18 1330  piperacillin-tazobactam (ZOSYN) IVPB 3.375 g     3.375 g 100 mL/hr over 30 Minutes  Intravenous  Once 09/18/18 1324 09/18/18 1438   09/18/18 0937  ceFAZolin (ANCEF) IVPB 2g/100 mL premix  Status:  Discontinued     2 g 200 mL/hr over 30 Minutes Intravenous 30 min pre-op 09/18/18 0937 09/18/18 1324   09/18/18 0916  ceFAZolin (ANCEF) 2-4 GM/100ML-% IVPB    Note to Pharmacy: Cordelia Pen   : cabinet override      09/18/18 0916 09/18/18 2129       Objective: Vitals:   09/19/18 0300 09/19/18 0400 09/19/18 0500 09/19/18 0848  BP: 132/75 (!) 123/58 116/66 122/63  Pulse: 65 62 62   Resp: 18 18 17 18   Temp:  98 F (36.7 C)  98.8 F (37.1 C)  TempSrc:  Axillary  Oral  SpO2: 100% 95% 94% 94%  Weight:      Height:        Intake/Output Summary (Last 24 hours) at 09/19/2018 1315 Last data filed at 09/19/2018 1000 Gross per 24 hour  Intake 921.52 ml  Output -  Net 921.52 ml   Filed Weights   09/18/18 1007  Weight: 109.9 kg    Examination:  General exam: Appears calm and comfortable  Respiratory system: Clear to auscultation. Respiratory effort normal. No respiratory distress. No conversational dyspnea.  On 2 L nasal cannula O2 Cardiovascular system: S1 & S2 heard, RRR. No murmurs. No pedal edema. Gastrointestinal system: Abdomen is nondistended, soft and nontender. Normal bowel sounds heard. Central nervous system: Alert and oriented. No focal neurological deficits. Speech clear.  Extremities: Symmetric in appearance, left thumb in a dry and clean dressing Psychiatry: Judgement and insight appear normal. Mood & affect appropriate.   Data Reviewed: I have personally reviewed following labs and imaging studies  CBC: Recent Labs  Lab 09/18/18 0945 09/18/18 1227 09/18/18 1229 09/19/18 0833  WBC  --  4.6  --  4.9  NEUTROABS  --  3.2  --  3.4  HGB 8.8* 8.1* 7.5* 7.5*  HCT 26.0* 26.0* 22.0* 24.4*  MCV  --  102.0*  --  102.1*  PLT  --  175  --  676   Basic Metabolic Panel: Recent Labs  Lab 09/18/18 0945 09/18/18 1227 09/18/18 1229 09/19/18 0833  NA  137 139 137 137  K 4.2 4.4 4.2 4.5  CL  --  93*  --  94*  CO2  --  29  --  27  GLUCOSE 161* 145*  --  96  BUN  --  37*  --  40*  CREATININE  --  8.98*  --  10.18*  CALCIUM  --  8.7*  --  8.2*  MG  --   --   --  2.3  PHOS  --   --   --  6.5*   GFR: Estimated Creatinine Clearance: 8.3 mL/min (A) (by C-G formula based on SCr of 10.18 mg/dL (H)). Liver Function Tests: Recent Labs  Lab 09/18/18 1227 09/19/18 0833  AST 36 28  ALT 23 17  ALKPHOS 74 72  BILITOT 0.8 1.0  PROT 7.8 7.2  ALBUMIN 3.3* 2.9*   No results for input(s): LIPASE, AMYLASE in the last 168 hours. No results for input(s):  AMMONIA in the last 168 hours. Coagulation Profile: No results for input(s): INR, PROTIME in the last 168 hours. Cardiac Enzymes: Recent Labs  Lab 09/19/18 0833  CKTOTAL 245*   BNP (last 3 results) No results for input(s): PROBNP in the last 8760 hours. HbA1C: Recent Labs    09/19/18 0833  HGBA1C 8.7*   CBG: Recent Labs  Lab 09/18/18 1531 09/18/18 2049 09/19/18 0001 09/19/18 0302 09/19/18 0840  GLUCAP 105* 102* 96 82 88   Lipid Profile: Recent Labs    09/18/18 1227 09/19/18 0833  TRIG 142 153*   Thyroid Function Tests: No results for input(s): TSH, T4TOTAL, FREET4, T3FREE, THYROIDAB in the last 72 hours. Anemia Panel: Recent Labs    09/18/18 1227 09/19/18 0833  FERRITIN 1,561* 1,721*   Sepsis Labs: Recent Labs  Lab 09/18/18 1227  PROCALCITON 0.43    Recent Results (from the past 240 hour(s))  SARS CORONAVIRUS 2 Nasal Swab Aptima Multi Swab     Status: Abnormal   Collection Time: 09/14/18 12:19 PM   Specimen: Aptima Multi Swab; Nasal Swab  Result Value Ref Range Status   SARS Coronavirus 2 POSITIVE (A) NEGATIVE Final    Comment: (NOTE) SARS-CoV-2 target nucleic acids are DETECTED. The SARS-CoV-2 RNA is generally detectable in upper and lower respiratory specimens during the acute phase of infection. Positive results are indicative of active infection  with SARS-CoV-2. Clinical  correlation with patient history and other diagnostic information is necessary to determine patient infection status. Positive results do  not rule out bacterial infection or co-infection with other viruses. The expected result is Negative. Fact Sheet for Patients: SugarRoll.be Fact Sheet for Healthcare Providers: https://www.woods-mathews.com/ This test is not yet approved or cleared by the [place] FDA and  has been authorized for detection and/or diagnosis of SARS-CoV-2 by FDA under an Emergency Use Authorization (EUA). This EUA will remain  in effect (meaning this test can be used) for the duration of the COVID-19 declaration under Section 564(b)(1) of the Act, 21 U.S.C.  section 360bbb-3(b)(1), unless the authorization is terminated or revoked sooner. Performed at [facility], [place] [address], [place], [place] [address]   Culture, blood (routine x 2)     Status: None (Preliminary result)   Collection Time: 09/18/18 12:20 PM   Specimen: BLOOD  Result Value Ref Range Status   Specimen Description BLOOD RIGHT ANTECUBITAL  Final   Special Requests   Final    BOTTLES DRAWN AEROBIC ONLY Blood Culture adequate volume   Culture   Final    NO GROWTH < 24 HOURS Performed at [facility], [place] [address], [place], [place] [address]    Report Status PENDING  Incomplete  MRSA PCR Screening     Status: None   Collection Time: 09/18/18 12:20 PM   Specimen: Nasopharyngeal  Result Value Ref Range Status   MRSA by PCR NEGATIVE NEGATIVE Final    Comment:        The GeneXpert MRSA Assay (FDA approved for NASAL specimens only), is one component of a comprehensive MRSA colonization surveillance program. It is not intended to diagnose MRSA infection nor to guide or monitor treatment for MRSA infections. Performed at [facility], [place] [address], [place], [place] [address]   Culture, blood (routine  x 2)     Status: None (Preliminary result)   Collection Time: 09/18/18 12:25 PM   Specimen: BLOOD RIGHT WRIST  Result Value Ref Range Status   Specimen Description BLOOD  RIGHT WRIST  Final   Special Requests   Final    BOTTLES DRAWN AEROBIC ONLY Blood Culture adequate volume   Culture   Final    NO GROWTH < 24 HOURS Performed at [facility], [address], [place], [place] [address]    Report Status PENDING  Incomplete      Radiology Studies: Ct Head Wo Contrast  Result Date: 09/18/2018 CLINICAL DATA:  Altered mental status (AMS), unclear cause EXAM: CT HEAD WITHOUT CONTRAST TECHNIQUE: Contiguous axial images were obtained from the base of the skull through the vertex without intravenous contrast. COMPARISON:  CT of the head on 07/07/2017 FINDINGS: Brain: Study quality is degraded by patient motion artifact There is no evidence for hemorrhage, mass lesion, or acute infarction. Vascular: There is dense atherosclerotic calcification of the internal carotid arteries. No hyperdense vessels. Skull: Normal. Negative for fracture or focal lesion. Sinuses/Orbits: No acute finding. Other: None IMPRESSION: 1. No evidence for acute intracranial abnormality. 2. Significant atherosclerotic calcification of the internal carotid arteries. Electronically Signed   By: Nolon Nations M.D.   On: 09/18/2018 15:20   Dg Chest Port 1 View  Result Date: 09/18/2018 CLINICAL DATA:  Altered mental status. COVID-19 positive. EXAM: PORTABLE CHEST 1 VIEW COMPARISON:  07/09/2017. FINDINGS: Stable enlarged cardiac silhouette. Stable prominence of the interstitial markings. Patchy density in the left lower lung zone with improvement. Resolved patchy opacity at the right lung base. Unremarkable bones. IMPRESSION: 1. Patchy atelectasis or pneumonia at the left lung base, improved compared to the previous examination. 2. Stable cardiomegaly and mild chronic interstitial lung disease. Electronically Signed   By: Claudie Revering M.D.   On: 09/18/2018 11:36      Scheduled Meds: . albuterol  2 puff Inhalation Q6H  . atorvastatin  80 mg Oral q1800  . [START ON 09/20/2018] calcitRIOL  1.5 mcg Oral Q M,W,F-HD  . calcitRIOL  1.5 mcg Oral Once  . [START ON 09/20/2018] Chlorhexidine Gluconate Cloth  6 each Topical Q0600  . dexamethasone (DECADRON) injection  6 mg Intravenous Q24H  . [START ON 09/20/2018] heparin  10,000 Units Dialysis Once in dialysis  . heparin  7,500 Units Subcutaneous Q8H  . insulin aspart  0-9 Units Subcutaneous TID WC  . [START ON 09/20/2018] mouth rinse  15 mL Mouth Rinse BID  . vitamin C  500 mg Oral Daily  . zinc sulfate  220 mg Oral Daily   Continuous Infusions: . sodium chloride 10 mL/hr at 09/19/18 1000  . piperacillin-tazobactam (ZOSYN)  IV Stopped (09/19/18 0713)     LOS: 1 day      Time spent: 40 minutes   Dessa Phi, DO [facility] [email or web address] 09/19/2018, 1:15 PM

## 2018-09-19 NOTE — Plan of Care (Signed)
  Problem: Education: Goal: Knowledge of General Education information will improve Description: Including pain rating scale, medication(s)/side effects and non-pharmacologic comfort measures Outcome: Progressing   Problem: Clinical Measurements: Goal: Ability to maintain clinical measurements within normal limits will improve Outcome: Progressing Goal: Respiratory complications will improve Outcome: Progressing Goal: Cardiovascular complication will be avoided Outcome: Progressing   Problem: Coping: Goal: Level of anxiety will decrease Outcome: Progressing   Problem: Activity: Goal: Risk for activity intolerance will decrease Outcome: Not Progressing

## 2018-09-19 NOTE — Progress Notes (Signed)
[facility] Progress Note  Subjective: no c/o today  Vitals:   09/19/18 0300 09/19/18 0400 09/19/18 0500 09/19/18 0848  BP: 132/75 (!) 123/58 116/66 122/63  Pulse: 65 62 62   Resp: 18 18 17 18   Temp:  98 F (36.7 C)  98.8 F (37.1 C)  TempSrc:  Axillary  Oral  SpO2: 100% 95% 94% 94%  Weight:      Height:        Inpatient medications: . albuterol  2 puff Inhalation Q6H  . atorvastatin  80 mg Oral q1800  . [START ON 09/20/2018] calcitRIOL  1.5 mcg Oral Q M,W,F-HD  . calcitRIOL  1.5 mcg Oral Once  . heparin  7,500 Units Subcutaneous Q8H  . insulin aspart  0-9 Units Subcutaneous Q4H  . [START ON 09/20/2018] mouth rinse  15 mL Mouth Rinse BID  . vitamin C  500 mg Oral Daily  . zinc sulfate  220 mg Oral Daily   . sodium chloride 10 mL/hr at 09/19/18 1000  . piperacillin-tazobactam (ZOSYN)  IV Stopped (09/19/18 0713)   chlorpheniramine-HYDROcodone, guaiFENesin-dextromethorphan, hydrALAZINE    Exam:   obese , adult AAF in no distress, occ coughing, [place] O2  no jvd  Chest scattered faint crackles bilat bases, no wheezing  Cor RRR no RG   ABd obese soft ntnd   Ext mild edema pretib bilat, chronic skin discoloration LE's   NF, Ox 3       Dialysis: GO MWF 3rd for COVID+ pts (usual is GO TTS)  4h  110kg  2/2 bath  LUA AVF   Hep 10,000  Calcitriol 1.5 mcg PO /HD   Mircera 150 /HD q 2wks (last on 8/13)     Other Parsabiv  2.40mcg  q hd  Assessment/ Plan: 1. Acute Encephalopathy - wu per admit/ ?COVID Related, meds, other. Appears back to baseline this am.  2. ESRD -  HD TTS. HD today off sched then tomorrow to get back on MWF COVID schedule.  3. COVID -19 Infection - Positive per op testing/ RX  per admit team  4. Left Finger Gangrene - plans per [facility]/ ligation avf cancelled due to #1 5. Hypertension/volume  - no sign of vol overload , bp currently stable  6. Anemia of ESRD   - HGB 8.1, had max esa 8/13 as op, next esa due 8/27. OP hgb 7.8 7. Metabolic bone  disease -  Calcitriol on hd , (parsabiv not available @[facility] ) Ca 8.7 , binders when eating 8. DM type 2 - per admit  9. HO Chronic Pain - was on oxycodone as op/ rx per admit      [facility] 09/19/2018, 12:05 PM  Iron/TIBC/Ferritin/ %Sat    Component Value Date/Time   FERRITIN 1,721 (H) 09/19/2018 0833   Recent Labs  Lab 09/19/18 0833  NA 137  K 4.5  CL 94*  CO2 27  GLUCOSE 96  BUN 40*  CREATININE 10.18*  CALCIUM 8.2*  PHOS 6.5*  ALBUMIN 2.9*   Recent Labs  Lab 09/19/18 0833  AST 28  ALT 17  ALKPHOS 72  BILITOT 1.0  PROT 7.2   Recent Labs  Lab 09/19/18 0833  WBC 4.9  HGB 7.5*  HCT 24.4*  PLT 152

## 2018-09-19 NOTE — Progress Notes (Signed)
Pt mental status improved today. Spoke with OR and she would have to be treated as COVID active for at least 21 days after her positive test. I don't believe she can wait that long but probably would be less infectious after 10 days. We will reschedule her for 09/25/18.   Ruta Hinds MD [facility] [phone]

## 2018-09-19 NOTE — Progress Notes (Addendum)
Pharmacy Antibiotic Note  [name] is a 53 y.o. female admitted on 09/18/2018 for ligation of left arm AVF and catheter placement.  Found to be lethargic, therefore surgery was canceled and patient was admitted to the ICU.  She tested positive for covid pre-op.  Pharmacy has been consulted for Vancomycin and Zosyn dosing for gangrenous left finger for which the patient was on augmentin PTA. Patient has ESRD, with HD schedule of MWF  Due to patient being off their normal dialysis schedule, she will receive dialysis today (8/18) as well as tomorrow to restart her normal MWF schedule. Pt is afebrile with WBC wnls. CRP elevated at 8.7. PT is hemodynamically stable with BP 127/71 and HR 71.   Plan: Continue Zosyn EID 3.375gm IV Q12H Give Vancomycin 1000 mg x 1 w/ dialysis  Monitor HD schedule, clinical progress, cultures, temp, and WBCs.  Height: 5\' 9"  (175.3 cm) Weight: 253 lb 12 oz (115.1 kg) IBW/kg (Calculated) : 66.2  Temp (24hrs), Avg:98.2 F (36.8 C), Min:97.9 F (36.6 C), Max:98.8 F (37.1 C)  Recent Labs  Lab 09/18/18 1227 09/19/18 0833  WBC 4.6 4.9  CREATININE 8.98* 10.18*    Estimated Creatinine Clearance: 8.6 mL/min (A) (by C-G formula based on SCr of 10.18 mg/dL (H)).    No Known Allergies   Vanc 8/17 x1 (loadining dose) Zosyn 8/17 >> Augmentin PTA  8/14 covid (preop test) - positive 8/17 MRSA PCR - negative 8/17 BCx - ngtd x 1  Sherren Kerns, PharmD PGY1 [place] Resident [phone] 09/19/2018, 2:50 PM

## 2018-09-20 ENCOUNTER — Ambulatory Visit: Payer: Medicare Other | Admitting: Internal Medicine

## 2018-09-20 LAB — CBC WITH DIFFERENTIAL/PLATELET
Abs Immature Granulocytes: 0.05 10*3/uL (ref 0.00–0.07)
Basophils Absolute: 0 10*3/uL (ref 0.0–0.1)
Basophils Relative: 0 %
Eosinophils Absolute: 0 10*3/uL (ref 0.0–0.5)
Eosinophils Relative: 0 %
HCT: 23 % — ABNORMAL LOW (ref 36.0–46.0)
Hemoglobin: 7.2 g/dL — ABNORMAL LOW (ref 12.0–15.0)
Immature Granulocytes: 1 %
Lymphocytes Relative: 13 %
Lymphs Abs: 0.5 10*3/uL — ABNORMAL LOW (ref 0.7–4.0)
MCH: 31.9 pg (ref 26.0–34.0)
MCHC: 31.3 g/dL (ref 30.0–36.0)
MCV: 101.8 fL — ABNORMAL HIGH (ref 80.0–100.0)
Monocytes Absolute: 0.2 10*3/uL (ref 0.1–1.0)
Monocytes Relative: 4 %
Neutro Abs: 3.2 10*3/uL (ref 1.7–7.7)
Neutrophils Relative %: 82 %
Platelets: 164 10*3/uL (ref 150–400)
RBC: 2.26 MIL/uL — ABNORMAL LOW (ref 3.87–5.11)
RDW: 16.9 % — ABNORMAL HIGH (ref 11.5–15.5)
WBC: 3.9 10*3/uL — ABNORMAL LOW (ref 4.0–10.5)
nRBC: 0.5 % — ABNORMAL HIGH (ref 0.0–0.2)

## 2018-09-20 LAB — PHOSPHORUS: Phosphorus: 3.7 mg/dL (ref 2.5–4.6)

## 2018-09-20 LAB — INTERLEUKIN-6, PLASMA: Interleukin-6, Plasma: 97.1 pg/mL — ABNORMAL HIGH (ref 0.0–12.2)

## 2018-09-20 LAB — COMPREHENSIVE METABOLIC PANEL
ALT: 17 U/L (ref 0–44)
AST: 24 U/L (ref 15–41)
Albumin: 2.9 g/dL — ABNORMAL LOW (ref 3.5–5.0)
Alkaline Phosphatase: 65 U/L (ref 38–126)
Anion gap: 16 — ABNORMAL HIGH (ref 5–15)
BUN: 27 mg/dL — ABNORMAL HIGH (ref 6–20)
CO2: 25 mmol/L (ref 22–32)
Calcium: 8.4 mg/dL — ABNORMAL LOW (ref 8.9–10.3)
Chloride: 93 mmol/L — ABNORMAL LOW (ref 98–111)
Creatinine, Ser: 6.9 mg/dL — ABNORMAL HIGH (ref 0.44–1.00)
GFR calc Af Amer: 7 mL/min — ABNORMAL LOW (ref >60)
GFR calc non Af Amer: 6 mL/min — ABNORMAL LOW (ref >60)
Glucose, Bld: 276 mg/dL — ABNORMAL HIGH (ref 70–99)
Potassium: 4.3 mmol/L (ref 3.5–5.1)
Sodium: 134 mmol/L — ABNORMAL LOW (ref 135–145)
Total Bilirubin: 1.2 mg/dL (ref 0.3–1.2)
Total Protein: 7.5 g/dL (ref 6.5–8.1)

## 2018-09-20 LAB — GLUCOSE, CAPILLARY
Glucose-Capillary: 221 mg/dL — ABNORMAL HIGH (ref 70–99)
Glucose-Capillary: 142 mg/dL — ABNORMAL HIGH (ref 70–99)

## 2018-09-20 LAB — D-DIMER, QUANTITATIVE: D-Dimer, Quant: 2.68 ug/mL-FEU — ABNORMAL HIGH (ref 0.00–0.50)

## 2018-09-20 LAB — MAGNESIUM: Magnesium: 2.1 mg/dL (ref 1.7–2.4)

## 2018-09-20 LAB — TRIGLYCERIDES: Triglycerides: 145 mg/dL (ref <150)

## 2018-09-20 LAB — C-REACTIVE PROTEIN: CRP: 10.3 mg/dL — ABNORMAL HIGH (ref <1.0)

## 2018-09-20 LAB — CK: Total CK: 163 U/L (ref 38–234)

## 2018-09-20 LAB — FERRITIN: Ferritin: 1596 ng/mL — ABNORMAL HIGH (ref 11–307)

## 2018-09-20 MED ORDER — LORAZEPAM 2 MG/ML IJ SOLN
0.5000 mg | Freq: Once | INTRAMUSCULAR | Status: AC
  Administered 2018-09-20: 0.5 mg via INTRAVENOUS
  Filled 2018-09-20: qty 1

## 2018-09-20 MED ORDER — ZINC SULFATE 220 (50 ZN) MG PO CAPS: 220.0000 mg | ORAL_CAPSULE | Freq: Every day | ORAL | 0 refills | Status: DC

## 2018-09-20 MED ORDER — OXYCODONE HCL 15 MG PO TABS: 15.0000 mg | ORAL_TABLET | Freq: Every day | ORAL | 0 refills | Status: DC | PRN

## 2018-09-20 MED ORDER — CALCITRIOL 0.5 MCG PO CAPS
ORAL_CAPSULE | ORAL | Status: AC
  Administered 2018-09-20: 0.5 ug
  Filled 2018-09-20: qty 3

## 2018-09-20 MED ORDER — ASCORBIC ACID 500 MG PO TABS: 500.0000 mg | ORAL_TABLET | Freq: Every day | ORAL | 0 refills | Status: DC

## 2018-09-20 NOTE — Discharge Summary (Signed)
Physician Discharge Summary  [name] GQQ:[id number] DOB: May 13, 1965 DOA: 09/18/2018  PCP: Sandi Mariscal, MD  Admit date: 09/18/2018 Discharge date: 09/20/2018  Admitted From: Home Disposition: Home  Recommendations for Outpatient Follow-up:  1. Follow up with PCP in 1 week 2. Please obtain BMP/CBC in one week 3. COVID-19 isolation precautions 4. Please follow up on the following pending results: None  Home Health: None Equipment/Devices: None  Discharge Condition: Stable CODE STATUS: Full code Diet recommendation: Renal diet   Brief/Interim Summary:  Admission HPI written by Norval Morton, MD   HPI: [name] is a 53 y.o. female with medical history significant of hypertension, hyperlipidemia, ESRD on hemodialysis, PVD, tobacco use, and chronic back pain; who presented initially for ligation of left arm AV fistula and catheter placement for steal from left arm AV fistula.  History is obtained from review of records as she is currently altered and unable to provide any of her own.  Apparently patient was COVID positive on preop screening 3 days ago.  Photos were taken of the gangrenous left first digit during her last appointment Dr. Oneida Alar office on 8/6.  Patient was on antibiotics Augmentin patient was noted to be lethargic responding only to sternal rub and with cough.  Consulted by Dr. Oneida Alar to admit for further work-up given that she is COVID positive.  Most recent lab work from 1 month ago.   Hospital course:  COVID-19 infection Incidental asymptomatic. Started on decadron. No development of symptoms so decadron discontinue prior to discharge.  Acute metabolic encephalopathy Possibly secondary to narcotics. Recommend decreased dose on discharge. Baseline prior to discharge.  Gangrene of left finger with osteomyelitis Patient was scheduled for amputation prior to encephalopathy. Plan is for rescheduling possible amputation next week. Continue Augmentin on  discharge. Patient managed on Vancomycin and Zosyn while in the hospital.  Left brachial AV fistula Follow-up with vascular surgery for ligation.  ESRD HD per nephrology  Diabetes mellitus, type 2 Managed on SSI while inpatient.  Hyperlipidemia Lipitor  Discharge Diagnoses:  Principal Problem:   COVID-19 virus infection Active Problems:   Type II diabetes mellitus with complication, uncontrolled ([place])   Hyperlipidemia, mixed   ESRD on dialysis ([place])   Anemia   Acute metabolic encephalopathy   Gangrene ([place])    Discharge Instructions   Allergies as of 09/20/2018   No Known Allergies     Medication List    STOP taking these medications   amoxicillin-clavulanate 875-125 MG tablet Commonly known as: Augmentin     TAKE these medications   ascorbic acid 500 MG tablet Commonly known as: VITAMIN C Take 1 tablet (500 mg total) by mouth daily. Start taking on: September 21, 2018   atorvastatin 80 MG tablet Commonly known as: LIPITOR Take 1 tablet (80 mg total) by mouth daily. Take 1/2 tab daily for one week, then increase to the full tab. What changed: additional instructions   Auryxia 1 GM 210 MG(Fe) tablet Generic drug: ferric citrate Take 630 mg by mouth 3 (three) times daily with meals.   b complex-vitamin c-folic acid 0.8 MG Tabs tablet Take 1 tablet by mouth Every Tuesday,Thursday,and Saturday with dialysis.   diphenoxylate-atropine 2.5-0.025 MG tablet Commonly known as: Lomotil Take 1 tablet by mouth 4 (four) times daily as needed for diarrhea or loose stools.   Enbrel 50 MG/ML injection Generic drug: etanercept Inject 50 mg into the skin once a week.   gabapentin 100 MG capsule Commonly known as: NEURONTIN Take 100  mg by mouth daily.   HumaLOG Mix 75/25 KwikPen (75-25) 100 UNIT/ML Kwikpen Generic drug: Insulin Lispro Prot & Lispro Inject 30 Units into the skin 2 (two) times daily with a meal.   hydrOXYzine 25 MG tablet Commonly known as:  ATARAX/VISTARIL Take 25 mg by mouth daily.   metoprolol succinate 25 MG 24 hr tablet Commonly known as: TOPROL-XL Take 1 tablet (25 mg total) by mouth daily.   oxyCODONE 15 MG immediate release tablet Commonly known as: ROXICODONE Take 1 tablet (15 mg total) by mouth daily as needed for pain. What changed: when to take this   pramipexole 0.5 MG tablet Commonly known as: MIRAPEX Take 0.5 mg by mouth daily.   zinc sulfate 220 (50 Zn) MG capsule Take 1 capsule (220 mg total) by mouth daily. Start taking on: September 21, 2018      Follow-up Information    Sandi Mariscal, MD. Schedule an appointment as soon as possible for a visit in 1 week(s).   Specialty: Internal Medicine Contact information: [place] [address] [phone]        Rosetta Posner, MD. Call.   Specialties: Vascular Surgery, Cardiology Why: Surgery Contact information: [address] [place] [address] [phone]          No Known Allergies  Consultations:  Nephrology  Vascular surgery   Procedures/Studies: Ct Head Wo Contrast  Result Date: 09/18/2018 CLINICAL DATA:  Altered mental status (AMS), unclear cause EXAM: CT HEAD WITHOUT CONTRAST TECHNIQUE: Contiguous axial images were obtained from the base of the skull through the vertex without intravenous contrast. COMPARISON:  CT of the head on 07/07/2017 FINDINGS: Brain: Study quality is degraded by patient motion artifact There is no evidence for hemorrhage, mass lesion, or acute infarction. Vascular: There is dense atherosclerotic calcification of the internal carotid arteries. No hyperdense vessels. Skull: Normal. Negative for fracture or focal lesion. Sinuses/Orbits: No acute finding. Other: None IMPRESSION: 1. No evidence for acute intracranial abnormality. 2. Significant atherosclerotic calcification of the internal carotid arteries. Electronically Signed   By: Nolon Nations M.D.   On: 09/18/2018 15:20   Mr Hand Left Wo  Contrast  Result Date: 09/04/2018 CLINICAL DATA:  Dry gangrene of the left thumb. EXAM: MRI OF THE LEFT THUMB WITHOUT CONTRAST TECHNIQUE: Multiplanar, multisequence MR imaging of the left thumb was performed. No intravenous contrast was administered. COMPARISON:  Radiographs dated 08/11/2018 FINDINGS: Bones/Joint/Cartilage There is abnormal signal throughout the phalanges of the thumb with bone erosion of the distal half of the distal phalanx consistent with osteomyelitis. There is gas in the soft tissues of the distal thumb. Extensive soft tissue loss of the distal thumb. Small IP joint effusion. Muscles and Tendons The patient has ruptured the flexor pollicis longus tendon with proximal retraction since the prior study of 08/18/2018. Extensive edema in the muscles of the thenar eminence. There is also fluid around the flexor tendons in the palm extending to the carpal tunnel. Soft tissues Diffuse edema in the subcutaneous fat of the thumb and in the hand. IMPRESSION: 1. 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 2. Interval rupture of the flexor pollicis longus tendon with proximal retraction. Edema in the thenar eminence. Fluid around the extensor tendons in the palm which could be infectious tenosynovitis. Electronically Signed   By: Lorriane Shire M.D.   On: 09/04/2018 14:40   Dg Chest Port 1 View  Result Date: 09/18/2018 CLINICAL DATA:  Altered mental status.  COVID-19 positive. EXAM: PORTABLE CHEST 1 VIEW COMPARISON:  07/09/2017. FINDINGS: Stable enlarged cardiac silhouette. Stable prominence of the interstitial markings. Patchy density in the left lower lung zone with improvement. Resolved patchy opacity at the right lung base. Unremarkable bones. IMPRESSION: 1. Patchy atelectasis or pneumonia at the left lung base, improved compared to the previous examination. 2. Stable cardiomegaly and mild chronic interstitial lung disease. Electronically  Signed   By: Claudie Revering M.D.   On: 09/18/2018 11:36   Vas [place] Steal Exam  Result Date: 09/07/2018 DIALYSIS STEAL Reason for Exam: Left thumb gangrene. Access Site: Left Upper Extremity. Access Type: Unknown left upper arm dialysis access. History: Patient developed a blister on the left thumb. Limitations: Patient had difficulty remaining still. Performing Technologist: Delorise Shiner RVT  Examination Guidelines: A complete evaluation includes B-mode imaging, spectral Doppler, color Doppler, and power Doppler as needed of all accessible portions of each vessel. Bilateral testing is considered an integral part of a complete examination. Limited examinations for reoccurring indications may be performed as noted.  Findings:  +-----------------+-----------------+------------------+-----------------------+                    PPG w compression      PPG w/o              Comments                                                  compression                              +-----------------+-----------------+------------------+-----------------------+                    PSV w compression      PSV w/o                                                                        compression                              +-----------------+-----------------+------------------+-----------------------+  Left Radial              20                            left ulnar 68 cm/sec at   Artery                                                  rest....114 cm/sec w/  fistula compression.    +-----------------+-----------------+------------------+-----------------------+ Summary: Right digital pressure is 123 mm Hg. Left digital pressure at rest 72 mm Hg.  Left radial pressure increased from 103 at rest to 129 with fistula compression suggestive of a steal. Left distal ulnar artery pressure increases with dialysis access compression. *See table(s) above for measurements and observations.  Diagnosing physician: Ruta Hinds MD Electronically signed by Ruta Hinds MD on 09/07/2018 at 4:15:17 PM.    Final       Subjective: No issues today. No chest pain, cough, shortness of breath. Afebrile.  Discharge Exam: Vitals:   09/20/18 1130 09/20/18 1200  BP: 138/73 131/60  Pulse:  72  Resp:  16  Temp:  97.7 F (36.5 C)  SpO2:  93%   Vitals:   09/20/18 1030 09/20/18 1100 09/20/18 1130 09/20/18 1200  BP: 131/72 124/66 138/73 131/60  Pulse: 74 62  72  Resp: 19 (!) 21  16  Temp:    97.7 F (36.5 C)  TempSrc:    Oral  SpO2:  93%  93%  Weight:      Height:        General: Pt is alert, awake, not in acute distress Cardiovascular: RRR, S1/S2 +, no rubs, no gallops Respiratory: CTA bilaterally, no wheezing, no rhonchi Abdominal: Soft, NT, ND, bowel sounds + Extremities: no edema, no cyanosis    The results of significant diagnostics from this hospitalization (including imaging, microbiology, ancillary and laboratory) are listed below for reference.     Microbiology: Recent Results (from the past 240 hour(s))  SARS CORONAVIRUS 2 Nasal Swab Aptima Multi Swab     Status: Abnormal   Collection Time: 09/14/18 12:19 PM   Specimen: Aptima Multi Swab; Nasal Swab  Result Value Ref Range Status   SARS Coronavirus 2 POSITIVE (A) NEGATIVE Final    Comment: (NOTE) SARS-CoV-2 target nucleic acids are DETECTED. The SARS-CoV-2 RNA is generally detectable in upper and lower respiratory specimens during the acute phase of infection. Positive results are indicative of active infection with SARS-CoV-2. Clinical  correlation with patient history and other diagnostic information is necessary to determine patient infection status. Positive results do  not rule out bacterial infection or co-infection with other viruses. The expected result is Negative. Fact Sheet for Patients: SugarRoll.be Fact Sheet for Healthcare  Providers: https://www.woods-mathews.com/ This test is not yet approved or cleared by the [place] FDA and  has been authorized for detection and/or diagnosis of SARS-CoV-2 by FDA under an Emergency Use Authorization (EUA). This EUA will remain  in effect (meaning this test can be used) for the duration of the COVID-19 declaration under Section 564(b)(1) of the Act, 21 U.S.C.  section 360bbb-3(b)(1), unless the authorization is terminated or revoked sooner. Performed at [facility], [place] [address], [place], [place] [address]   Culture, blood (routine x 2)     Status: None (Preliminary result)   Collection Time: 09/18/18 12:20 PM   Specimen: BLOOD  Result Value Ref Range Status   Specimen Description BLOOD RIGHT ANTECUBITAL  Final   Special Requests   Final    BOTTLES DRAWN AEROBIC ONLY Blood Culture adequate volume   Culture   Final    NO GROWTH 2 DAYS Performed at [facility], [place] [address], [place], [place] [address]    Report Status PENDING  Incomplete  MRSA PCR Screening     Status: None   Collection Time: 09/18/18 12:20 PM   Specimen: Nasopharyngeal  Result Value Ref  Range Status   MRSA by PCR NEGATIVE NEGATIVE Final    Comment:        The GeneXpert MRSA Assay (FDA approved for NASAL specimens only), is one component of a comprehensive MRSA colonization surveillance program. It is not intended to diagnose MRSA infection nor to guide or monitor treatment for MRSA infections. Performed at [facility], [place] [address], [place], [place] [address]   Culture, blood (routine x 2)     Status: None (Preliminary result)   Collection Time: 09/18/18 12:25 PM   Specimen: BLOOD RIGHT WRIST  Result Value Ref Range Status   Specimen Description BLOOD RIGHT WRIST  Final   Special Requests   Final    BOTTLES DRAWN AEROBIC ONLY Blood Culture adequate volume   Culture   Final    NO GROWTH 2 DAYS Performed at [facility], [place] [address] [place],  [address]    Report Status PENDING  Incomplete     Labs: BNP (last 3 results) Recent Labs    09/18/18 1227  BNP 378.5*   Basic Metabolic Panel: Recent Labs  Lab 09/18/18 0945 09/18/18 1227 09/18/18 1229 09/19/18 0833 09/20/18 0440  NA 137 139 137 137 134*  K 4.2 4.4 4.2 4.5 4.3  CL  --  93*  --  94* 93*  CO2  --  29  --  27 25  GLUCOSE 161* 145*  --  96 276*  BUN  --  37*  --  40* 27*  CREATININE  --  8.98*  --  10.18* 6.90*  CALCIUM  --  8.7*  --  8.2* 8.4*  MG  --   --   --  2.3 2.1  PHOS  --   --   --  6.5* 3.7   Liver Function Tests: Recent Labs  Lab 09/18/18 1227 09/19/18 0833 09/20/18 0440  AST 36 28 24  ALT 23 17 17   ALKPHOS 74 72 65  BILITOT 0.8 1.0 1.2  PROT 7.8 7.2 7.5  ALBUMIN 3.3* 2.9* 2.9*   No results for input(s): LIPASE, AMYLASE in the last 168 hours. No results for input(s): AMMONIA in the last 168 hours. CBC: Recent Labs  Lab 09/18/18 0945 09/18/18 1227 09/18/18 1229 09/19/18 0833 09/20/18 0440  WBC  --  4.6  --  4.9 3.9*  NEUTROABS  --  3.2  --  3.4 3.2  HGB 8.8* 8.1*   8.1* 7.5* 7.5* 7.2*  HCT 26.0* 26.0* 22.0* 24.4* 23.0*  MCV  --  102.0*  --  102.1* 101.8*  PLT  --  175  --  152 164   Cardiac Enzymes: Recent Labs  Lab 09/19/18 0833 09/20/18 0440  CKTOTAL 245* 163   BNP: Invalid input(s): POCBNP CBG: Recent Labs  Lab 09/19/18 1740 09/19/18 2112 09/19/18 2114 09/19/18 2335 09/20/18 0808  GLUCAP 115* 268* 250* 343* 221*   D-Dimer Recent Labs    09/19/18 0833 09/20/18 0440  DDIMER 2.51* 2.68*   Hgb A1c Recent Labs    09/19/18 0833  HGBA1C 8.7*   Lipid Profile Recent Labs    09/19/18 0833 09/20/18 0440  TRIG 153* 145   Thyroid function studies No results for input(s): TSH, T4TOTAL, T3FREE, THYROIDAB in the last 72 hours.  Invalid input(s): FREET3 Anemia work up Recent Labs    09/19/18 0833 09/20/18 0440  FERRITIN 1,721* 1,596*   Urinalysis    Component Value Date/Time    COLORURINE YELLOW 07/22/2011 1526  APPEARANCEUR CLOUDY (A) 07/22/2011 1526   LABSPEC 1.022 07/22/2011 1526   PHURINE 5.5 07/22/2011 1526   GLUCOSEU 100 (A) 07/22/2011 1526   HGBUR MODERATE (A) 07/22/2011 1526   BILIRUBINUR NEGATIVE 07/22/2011 1526   BILIRUBINUR NEG 07/19/2011 1202   KETONESUR NEGATIVE 07/22/2011 1526   PROTEINUR >300 (A) 07/22/2011 1526   UROBILINOGEN 0.2 07/22/2011 1526   NITRITE NEGATIVE 07/22/2011 1526   LEUKOCYTESUR TRACE (A) 07/22/2011 1526   Sepsis Labs Invalid input(s): PROCALCITONIN,  WBC,  LACTICIDVEN Microbiology Recent Results (from the past 240 hour(s))  SARS CORONAVIRUS 2 Nasal Swab Aptima Multi Swab     Status: Abnormal   Collection Time: 09/14/18 12:19 PM   Specimen: Aptima Multi Swab; Nasal Swab  Result Value Ref Range Status   SARS Coronavirus 2 POSITIVE (A) NEGATIVE Final    Comment: (NOTE) SARS-CoV-2 target nucleic acids are DETECTED. The SARS-CoV-2 RNA is generally detectable in upper and lower respiratory specimens during the acute phase of infection. Positive results are indicative of active infection with SARS-CoV-2. Clinical  correlation with patient history and other diagnostic information is necessary to determine patient infection status. Positive results do  not rule out bacterial infection or co-infection with other viruses. The expected result is Negative. Fact Sheet for Patients: SugarRoll.be Fact Sheet for Healthcare Providers: https://www.woods-mathews.com/ This test is not yet approved or cleared by the [place] FDA and  has been authorized for detection and/or diagnosis of SARS-CoV-2 by FDA under an Emergency Use Authorization (EUA). This EUA will remain  in effect (meaning this test can be used) for the duration of the COVID-19 declaration under Section 564(b)(1) of the Act, 21 U.S.C.  section 360bbb-3(b)(1), unless the authorization is terminated or revoked sooner. Performed  at [facility], [place] [address], [place], [place] [address]   Culture, blood (routine x 2)     Status: None (Preliminary result)   Collection Time: 09/18/18 12:20 PM   Specimen: BLOOD  Result Value Ref Range Status   Specimen Description BLOOD RIGHT ANTECUBITAL  Final   Special Requests   Final    BOTTLES DRAWN AEROBIC ONLY Blood Culture adequate volume   Culture   Final    NO GROWTH 2 DAYS Performed at [facility], [place] [address] [place], [place] [address]    Report Status PENDING  Incomplete  MRSA PCR Screening     Status: None   Collection Time: 09/18/18 12:20 PM   Specimen: Nasopharyngeal  Result Value Ref Range Status   MRSA by PCR NEGATIVE NEGATIVE Final    Comment:        The GeneXpert MRSA Assay (FDA approved for NASAL specimens only), is one component of a comprehensive MRSA colonization surveillance program. It is not intended to diagnose MRSA infection nor to guide or monitor treatment for MRSA infections. Performed at [facility], [place] [address], [place], [place] [address]   Culture, blood (routine x 2)     Status: None (Preliminary result)   Collection Time: 09/18/18 12:25 PM   Specimen: BLOOD RIGHT WRIST  Result Value Ref Range Status   Specimen Description BLOOD RIGHT WRIST  Final   Special Requests   Final    BOTTLES DRAWN AEROBIC ONLY Blood Culture adequate volume   Culture   Final    NO GROWTH 2 DAYS Performed at [facility], [place] [address], [place],  [address]    Report Status PENDING  Incomplete     Time coordinating discharge:  35 minutes  SIGNED:   Cordelia Poche, MD [facility] 09/20/2018, 1:19 PM

## 2018-09-20 NOTE — Progress Notes (Addendum)
PT Cancellation Note  Patient Details Name: [name] MRN: [id number] DOB: [birth year]   Cancelled Treatment:    Reason Eval/Treat Not Completed: Patient at procedure or test/unavailable. Pt undergoing bed side DIALYSIS in the room. Acute PT to return at later date as able.  [name], PT, DPT Acute Rehabilitation Services Pager #: [phone] Office #: [phone]    [name] 09/20/2018, 9:38 AM

## 2018-09-20 NOTE — Progress Notes (Signed)
Renal Navigator notes discharge order and contacted patient to ensure she is aware of resuming OP HD treatment at [facility] 3rd isolation shift, MWF. She states she is and asked how long she has to maintain this schedule. Renal Navigator stated that the clinic will be able to test her and that she must remain on this shift until she is no longer positive for COVID and has met the criteria to return to her regular negative chair time. She stated understanding. Patient states she is feeling well. Renal Navigator notified OP HD clinic/[facility] of plan for discharge today and asked that they contact [facility] to reinstate transportation services for patient's next schedule OP HD appointment.  ,  Elizabeth, LCSW Renal Navigator  [phone] 

## 2018-09-20 NOTE — Progress Notes (Signed)
Discharge instructions provided to patient at the bedside. Patient is understanding of new medication regimen with teachback. Patient has a nephew whom is able to pick up prescriptions from pharmacy to limit any further exposure to bystanders. IV removed and pt ambulatory to [facility] stretcher. No further questions and v/s stable prior to discharge.

## 2018-09-20 NOTE — Progress Notes (Signed)
Inpatient Diabetes Program Recommendations  AACE/ADA: New Consensus Statement on Inpatient Glycemic Control   Target Ranges:  Prepandial:   less than 140 mg/dL      Peak postprandial:   less than 180 mg/dL (1-2 hours)      Critically ill patients:  140 - 180 mg/dL  Results for RAYNAH, GOMES (MRN [id number]) as of 09/20/2018 07:23  Ref. Range 09/20/2018 04:40  Glucose Latest Ref Range: 70 - 99 mg/dL 276 (H)   Results for FARRA, NIKOLIC (MRN [id number]) as of 09/20/2018 07:23  Ref. Range 09/19/2018 08:40 09/19/2018 13:26 09/19/2018 17:40 09/19/2018 21:12 09/19/2018 21:14 09/19/2018 23:35  Glucose-Capillary Latest Ref Range: 70 - 99 mg/dL 88 115 (H) 115 (H) 268 (H) 250 (H) 343 (H)   Results for JORGINA, BINNING (MRN [id number]) as of 09/20/2018 07:23  Ref. Range 09/19/2018 08:33  Hemoglobin A1C Latest Ref Range: 4.8 - 5.6 % 8.7 (H)   Review of Glycemic Control  Diabetes history: DM2 Outpatient Diabetes medications: Humalog 75/25 30 units BID Current orders for Inpatient glycemic control: Novolog 0-9 units TID with meals; Decadron 6 mg daily  Inpatient Diabetes Program Recommendations:   Insulin-Basal: If Decadron is continued, please consider ordering Lantus 10 units Q24H.  Insulin-Correction: Please consider ordering Novolog 0-5 units QHS for bedtime correction.  Insulin-Meal Coverage: If Decdron is continued, please consider ordering Novolog 3 units TID with meals for meal coverage if patient eats at least 50% of meals.  NOTE: Patient was started on Decadron on 09/19/18 and glucose consistently elevated since steroids started.   Thanks, Barnie Alderman, RN, MSN, CDE Diabetes Coordinator Inpatient Diabetes Program [phone] (Team Pager from 8am to 5pm)

## 2018-09-20 NOTE — Progress Notes (Signed)
[facility] Progress Note  Subjective: chart reviewed;  Patient not examined directly given COVID-19 + status, utilizing exam of the primary team and observations of RN's.    Vitals:   09/20/18 1000 09/20/18 1030 09/20/18 1100 09/20/18 1130  BP: 114/67 131/72 124/66 138/73  Pulse: 72 74 62   Resp: 20 19 (!) 21   Temp:      TempSrc:      SpO2:   93%   Weight:      Height:        Inpatient medications: . albuterol  2 puff Inhalation Q6H  . atorvastatin  80 mg Oral q1800  . calcitRIOL  1.5 mcg Oral Q M,W,F-HD  . Chlorhexidine Gluconate Cloth  6 each Topical Q0600  . dexamethasone (DECADRON) injection  6 mg Intravenous Daily  . heparin  10,000 Units Dialysis Once in dialysis  . heparin  7,500 Units Subcutaneous Q8H  . insulin aspart  0-9 Units Subcutaneous TID WC  . mouth rinse  15 mL Mouth Rinse BID  . vitamin C  500 mg Oral Daily  . zinc sulfate  220 mg Oral Daily   . sodium chloride Stopped (09/19/18 1725)  . piperacillin-tazobactam (ZOSYN)  IV 3.375 g (09/20/18 0258)  . vancomycin     chlorpheniramine-HYDROcodone, guaiFENesin-dextromethorphan, hydrALAZINE    Exam:   Patient not examined directly given COVID-19 + status, utilizing exam of the primary team and observations of RN's.       Dialysis: GO MWF 3rd for COVID+ pts (usual is GO TTS)  4h  110kg  2/2 bath  LUA AVF   Hep 10,000  Calcitriol 1.5 mcg PO /HD   Mircera 150 /HD q 2wks (last on 8/13)     Other Parsabiv  2.65mcg  q hd  Assessment/ Plan: 1. Acute Encephalopathy - unclear cause, possible pain meds for finger. Resolved. 2. ESRD -  HD TTS. Had HD here yest and is on HD again today to get back on schedule (MWF).  3. COVID -19 Infection - Positive per op testing/ RX  per admit team  4. Left Finger Gangrene - plans per VVS/ ligation avf cancelled due to #1 5. Hypertension/volume  - no sign of vol overload , bp currently stable. UF 2.5 L today (pt is up 2kg).  6. Anemia of ESRD   - HGB 8.1,  had max esa 8/13 as op, next esa due 8/27. OP hgb 7.8 7. Metabolic bone disease -  Calcitriol on hd , (parsabiv not available @[facility] ) Ca 8.7 , binders when eating 8. DM type 2 - per admit  9. HO Chronic Pain - was on oxycodone as op/ rx per admit 10. Dispo - per primary     [facility] 09/20/2018, 11:47 AM  Iron/TIBC/Ferritin/ %Sat    Component Value Date/Time   FERRITIN 1,596 (H) 09/20/2018 0440   Recent Labs  Lab 09/20/18 0440  NA 134*  K 4.3  CL 93*  CO2 25  GLUCOSE 276*  BUN 27*  CREATININE 6.90*  CALCIUM 8.4*  PHOS 3.7  ALBUMIN 2.9*   Recent Labs  Lab 09/20/18 0440  AST 24  ALT 17  ALKPHOS 65  BILITOT 1.2  PROT 7.5   Recent Labs  Lab 09/20/18 0440  WBC 3.9*  HGB 7.2*  HCT 23.0*  PLT 164

## 2018-09-20 NOTE — Progress Notes (Signed)
CSW arranged for transportation home via [facility] at [place] today, Marylyn Ishihara, RN aware of plan.  Madilyn Fireman, MSW, LCSW-A Clinical Social Worker [facility] [phone]

## 2018-09-20 NOTE — Progress Notes (Signed)
OT Cancellation Note  Patient Details Name: [name] MRN: [id number] DOB: 04-13-1965   Cancelled Treatment:    Reason Eval/Treat Not Completed: Patient at procedure or test/ unavailable (currently undergoing bedside dialysis). Will follow up for OT eval as able.  Lou Cal, OT [facility] Pager [phone] Office [place] 09/20/2018, 9:59 AM

## 2018-09-20 NOTE — Progress Notes (Signed)
PT has not slept tonight. Claims she goes 4-5 days without sleep at home and then passes out into a deep sleep for several hours. She claims lack of sleep was the reason for her episode on 8/18. Throughout the night PT has become increasingly restless-- pulling lines and sitting with legs over bed rails. PT states she does not want to lie in the bed and does not care about her oxygen. Medication for relaxation requested. Ativan 0.5 given. PT in bed watching television. RN will continue to monitor.

## 2018-09-20 NOTE — Discharge Instructions (Signed)

## 2018-09-21 ENCOUNTER — Other Ambulatory Visit: Payer: Self-pay | Admitting: *Deleted

## 2018-09-21 LAB — INTERLEUKIN-6, PLASMA: Interleukin-6, Plasma: 10.8 pg/mL (ref 0.0–12.2)

## 2018-09-22 ENCOUNTER — Telehealth: Payer: Self-pay | Admitting: *Deleted

## 2018-09-22 NOTE — Telephone Encounter (Signed)
Return call to patient instructed to be at [facility] admitting at 9 am or as directed by hospital PST with detailed surgery instructions. NPO past MN night prior and must have a driver and caregiver for discharge to home. Verbalized understanding.

## 2018-09-22 NOTE — Progress Notes (Signed)
Anesthesia Follow-up: SAME DAY WORK-UP   Case: [id number] Date/Time: 09/25/18 1128   Procedures:      LIGATION OF ARTERIOVENOUS  FISTULA LEFT ARM (Left )     INSERTION OF DIALYSIS CATHETER (N/A )   Anesthesia type: Choice   Pre-op diagnosis: STEAL SYNDROME, NECROTIC LEFT FINGERS   Location: [place] OR ROOM 12 / [place] OR   Surgeon: Elam Dutch, MD     PATIENT TESTED POSITIVE FOR COVID-19 ON 09/14/2018.  Reported coughing.  Given nature of surgery, Dr. Oneida Alar plan to proceed on 09/18/2018 as a last case; however, shortly after arrival to holding she became lethargic and case was canceled and she was admitted to the ICU for acute encephalopathy.  DDX included hypercapnia versus pain medication versus infection.  Head CT without acute findings.  Chest x-ray showed patchy atelectasis versus pneumonia left lung base, but improved since 2019.  She was treated empirically with vancomycin and Zosyn.  Oxycodone held.  Status improved, and she was discharged home 09/20/2018.  DR. Oneida Alar DID NOT FEEL HER SURGERY COULD BE POSTPONED FOR 21 DAYS, SO RESCHEDULED FOR 09/25/2018 AND WILL KEEP AS HIS LAST CASE WITH COVID PRECAUTIONS.    DISCUSSION: Patient is a 53 year old female scheduled for the above procedure. She has a LUE AVF and developed paronychia of the left thumb in July initially treated with drainage and Augmentin. She was seen by Ortho and Wound Care. MRI showed osteomyelitis, but also concern for steal syndrome and seen by Dr. Oneida Alar on 09/07/18 for left thumb gangrene with concern she may ultimately require amputation of that finger. He felt gangrene likely due to steal, although possible underlying small vessel disease as well. He recommended the above procedure, and is deferring decision to hand surgeon to Wound Care Linton Ham, MD).   History includes ESRD (HD TTS, [facility]), smoking, HTN, DM2, hypercholesterolemia, PVD (s/p RLE stent), anemia, CAD (non-obstructive 2019 coronary CT), LVH,  pericarditis with pericardial effusion (s/p pericardial window 02/07/04). She is followed by Dr. 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    Anesthesia team to evaluate on the day of surgery.   PROVIDERS: Sandi Mariscal, MD is PCP  Minus Breeding, MD is cardiologist. Last visit 06/27/18. He ordered a cardiac MRI given severe LVH. Possible variant morphology of hypertrophic CM, but study non-diagnostic since gadolinium could not be sued given renal disease. He plans to see her back in September to discuss next steps/testing.    LABS: She is for labs on the day of surgery. As of 09/20/18, H/H 7.2/23.0. A1c 8.7 on 09/19/18 (down from 10.7 on 06/01/17).   IMAGES: 1V PCXR 09/18/18: IMPRESSION: 1. Patchy atelectasis or pneumonia at the left lung base, improved compared to the previous examination. 2. Stable cardiomegaly and mild chronic interstitial lung disease.  MR left hand 09/04/18: IMPRESSION: 1. 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 2. Interval rupture of the flexor pollicis longus tendon with proximal retraction. Edema in the thenar eminence. Fluid around the extensor tendons in the palm which could be infectious tenosynovitis.   EKG: 09/18/18:  Sinus bradycardia with Premature atrial complexes. Rate 58 bpm Right bundle branch block Abnormal ECG Confirmed by Ida Rogue [phone]) on 09/18/2018 11:39:49 PM - Known RBB    CV: MR Cardiac 07/31/18: IMPRESSION: 1. Normal biventricular chamber  size and function. 2. Asymmetric septal hypertrophy, 19 mm at the LV basal anteroseptum and anterior wall, and 15 mm at the LV mid inferoseptum. No significant flow dephasing to suggest LV outflow tract obstruction. No evidence of systolic anterior motion of the  mitral valve. These findings in combination could suggest a variant morphology of hypertrophic cardiomyopathy. 3. Gadolinium contrast was not administered due to end stage renal disease. Unable to assess for burden of delayed enhancement in the evaluation of possible hypertrophic cardiomyopathy or other cardiomyopathic process.   CT Coronary with FFR 07/08/17: FINDINGS: Aorta: Normal size. Mild diffuse calcifications. No dissection. Aortic Valve: Trileaflet. No calcifications. Coronary Arteries: Normal coronary origin. Right dominance. - RCA is a large dominant artery that gives rise to PDA and PLVB. There mild to moderate diffuse calcified plaque with maximum stenosis 50-69% in proximal to mid segment. PDA and PLA are poorly visualized secondary to patients size. - Left main is a large artery that gives rise to LAD and LCX arteries. Left main has no plaque. - LAD is a large vessel that gives rise to one diagonal artery. There is diffuse moderate calcified plaque with maximum stenosis of 50-69% in the mid segment. - D1 is a medium size artery with mild diffuse calcified plaque and maximum stenosis 25-50% in the proximal portion. - LCX is a medium size non-dominant artery that gives rise to one large OM1 branch. There is only minimal calcified plaque. Other findings: - Normal pulmonary vein drainage into the left atrium. - Normal let atrial appendage without a thrombus. - Severely dilated pulmonary artery measuring 48 mm suggestive of pulmonary hypertension. IMPRESSION: 1. Coronary calcium score of 837. This was 50 percentile for age and sex matched control. 2. Normal coronary origin with right dominance. 3. Diffuse calcified plaque with moderate CAD in the proximal to mid RCA and mid LAD. Additional analysis with CT FFR will be submitted. Aggressive risk factor modification should be initiated. 4. Severely dilated pulmonary artery measuring 48 mm suggestive of pulmonary  hypertension. (I do not see the FFR report, but cardiology notes indicate non-obstructive CAD with medication therapy recommended.)   Echo 06/29/17: Study Conclusions - Left ventricle: The cavity size was normal. There was severe concentric hypertrophy. Systolic function was normal. The estimated ejection fraction was in the range of 50% to 55%. Wall motion was normal; there were no regional wall motion abnormalities. Doppler parameters are consistent with abnormal left ventricular relaxation (grade 1 diastolic dysfunction). - Aortic valve: Transvalvular velocity was within the normal range. There was no stenosis. There was trivial regurgitation. - Mitral valve: Transvalvular velocity was within the normal range. There was no evidence for stenosis. There was mild regurgitation. - Left atrium: The atrium was severely dilated. - Right ventricle: The cavity size was normal. Wall thickness was normal. Systolic function was normal. - Atrial septum: No defect or patent foramen ovale was identified by color flow Doppler. - Tricuspid valve: There was trivial regurgitation. - Pulmonary arteries: Systolic pressure was within the normal range. PA peak pressure: 41 mm Hg (S). - Pericardium, extracardiac: A trivial pericardial effusion was identified.   Past Medical History:  Diagnosis Date  . Anemia   . Arthritis    "knees"  . CAD (coronary artery disease)    Nonobstructive on CT 2019  . ESRD (end stage renal disease) ([place])    Dialysis M/W/F  . Headache(784.0)   . High cholesterol   . Hypertension   . Nerve pain    "they say I  have L5 nerve damage; my lumbar"  . Pericardial effusion   . Pneumonia   . PVD (peripheral vascular disease) ([place])    Right leg stent in [place].  (No records)  . Restless legs   . Sleep apnea    does not use Cpap  . Type II diabetes mellitus ([place])     Past Surgical History:  Procedure Laterality Date  . AV FISTULA PLACEMENT  Left   . [place]; 1997  . EYE SURGERY Bilateral    cataract surgery  . PERICARDIAL WINDOW  02/2004   for pericardial effusion  . TUBAL LIGATION  05/1995    MEDICATIONS: No current facility-administered medications for this encounter.    Marland Kitchen atorvastatin (LIPITOR) 80 MG tablet  . AURYXIA 1 GM 210 MG(Fe) tablet  . b complex-vitamin c-folic acid (NEPHRO-VITE) 0.8 MG TABS tablet  . diphenoxylate-atropine (LOMOTIL) 2.5-0.025 MG tablet  . ENBREL 50 MG/ML injection  . gabapentin (NEURONTIN) 100 MG capsule  . hydrOXYzine (ATARAX/VISTARIL) 25 MG tablet  . Insulin Lispro Prot & Lispro (HUMALOG MIX 75/25 KWIKPEN) (75-25) 100 UNIT/ML Kwikpen  . metoprolol succinate (TOPROL-XL) 25 MG 24 hr tablet  . oxyCODONE (ROXICODONE) 15 MG immediate release tablet  . pramipexole (MIRAPEX) 0.5 MG tablet  . vitamin C (VITAMIN C) 500 MG tablet  . zinc sulfate 220 (50 Zn) MG capsule    Myra Gianotti, PA-C Surgical Short Stay/Anesthesiology [facility] Phone [phone] [facility] Phone [phone] 09/22/2018 2:03 PM

## 2018-09-22 NOTE — Progress Notes (Signed)
Patient denies shortness of breath, fever, and chest pain.  PCP - Dr Sandi Mariscal, [facility] Cardiologist - Dr Minus Breeding  Chest x-ray - 09/18/18 1 view EKG - 09/18/18 Stress Test - Denies ECHO - 06/29/17 Cardiac Cath - Denies  Sleep Study - Yes - [place], [place]-unknown location  CPAP - Does not use CPAP  Fasting Blood Sugar - patient does not know what her fasting sugar.  She does not know where her meter is, she recently moved.  Checks Blood Sugar ___0__ times a day Last A1C 8.7 on 09/19/18  Monday evening take 70% of her Humalog 75/25, she will take 21 units.  None DOS. Instructed her to check her blood sugar when she gets up Tuesday morning and every 2 hours until she leaves for the hospital. If her sugar is 70 or below, treat with 4 oz of apple or cranberry juice and recheck blood sugar 15 minutes after drinking juice.  If blood sugar continues to be 70 or below, call the Short Stay Dept. And ask to speak with a nurse for further instructions.  Anesthesia review: Yes, Ebony Hail, PA  STOP now taking any Aspirin (unless otherwise instructed by your surgeon), Aleve, Naproxen, Ibuprofen, Motrin, Advil, Goody's, BC's, all herbal medications, fish oil, and all vitamins.   Patient's covid test was positive on 09/14/18. Patient states she has a cough but no other symptoms. Patient instructed to call Short Stay Dept upon her arrival (at 7:15 am) at the main entrance.  Patient instructed to stay in her vehicle until the Short Stay staff comes out with a wheelchair to take her to her pre-op room (neg pressure).  Instructed patient that she needs to wear a mask and will have to keep it on at all times.  I also informed that she cannot have any visitors due to the fact they have been exposed to her.   Patient verbalized understanding.  [place], Short Stay and [place], [place] have been notified.

## 2018-09-22 NOTE — Anesthesia Preprocedure Evaluation (Addendum)
Anesthesia Evaluation  Patient identified by MRN, date of birth, ID band Patient unresponsive    Reviewed: Allergy & Precautions, Patient's Chart, lab work & pertinent test results  Airway      Mouth opening: Limited Mouth Opening  Dental   Pulmonary Current Smoker,    breath sounds clear to auscultation       Cardiovascular hypertension,  Rhythm:Regular Rate:Normal     Neuro/Psych    GI/Hepatic   Endo/Other  diabetes  Renal/GU      Musculoskeletal   Abdominal   Peds  Hematology   Anesthesia Other Findings   Reproductive/Obstetrics                            Anesthesia Physical Anesthesia Plan  ASA: III  Anesthesia Plan: General   Post-op Pain Management:    Induction: Intravenous  PONV Risk Score and Plan: Ondansetron  Airway Management Planned: Oral ETT  Additional Equipment:   Intra-op Plan:   Post-operative Plan: Extubation in OR  Informed Consent: I have reviewed the patients History and Physical, chart, labs and discussed the procedure including the risks, benefits and alternatives for the proposed anesthesia with the patient or authorized representative who has indicated his/her understanding and acceptance.       Plan Discussed with: CRNA and Anesthesiologist  Anesthesia Plan Comments: (See PAT note written 09/22/2018 by Myra Gianotti, PA-C.  + COVID 09/14/18. Surgery initially scheduled for 09/18/18 but became lethargic and admitted to the ICU. Discharged home 09/20/18. Dr. Oneida Alar did not feel case could be delayed for 21 days, so continue COVID-19 precautions. )       Anesthesia Quick Evaluation

## 2018-09-23 LAB — CULTURE, BLOOD (ROUTINE X 2)
Culture: NO GROWTH
Culture: NO GROWTH
Special Requests: ADEQUATE
Special Requests: ADEQUATE

## 2018-09-25 ENCOUNTER — Encounter (HOSPITAL_COMMUNITY): Payer: Self-pay | Admitting: *Deleted

## 2018-09-25 ENCOUNTER — Other Ambulatory Visit: Payer: Self-pay

## 2018-09-26 ENCOUNTER — Encounter (HOSPITAL_COMMUNITY)
Admission: RE | Disposition: A | Discharge status: Home / Self Care | Payer: Self-pay | Source: Home / Self Care | Attending: Vascular Surgery

## 2018-09-26 ENCOUNTER — Ambulatory Visit (HOSPITAL_COMMUNITY): Payer: Medicare Other

## 2018-09-26 ENCOUNTER — Inpatient Hospital Stay (HOSPITAL_COMMUNITY)
Admission: RE | Admit: 2018-09-26 | Discharge: 2018-09-27 | DRG: 252 | Disposition: A | Discharge status: Home / Self Care | Payer: Medicare Other | Attending: Vascular Surgery | Admitting: Vascular Surgery

## 2018-09-26 ENCOUNTER — Ambulatory Visit (HOSPITAL_COMMUNITY): Payer: Medicare Other | Admitting: Vascular Surgery

## 2018-09-26 ENCOUNTER — Encounter (HOSPITAL_COMMUNITY): Payer: Self-pay | Admitting: General Practice

## 2018-09-26 DIAGNOSIS — J9601 Acute respiratory failure with hypoxia: Secondary | ICD-10-CM | POA: Diagnosis not present

## 2018-09-26 DIAGNOSIS — Z79899 Other long term (current) drug therapy: Secondary | ICD-10-CM

## 2018-09-26 DIAGNOSIS — Z833 Family history of diabetes mellitus: Secondary | ICD-10-CM | POA: Diagnosis not present

## 2018-09-26 DIAGNOSIS — Z8619 Personal history of other infectious and parasitic diseases: Secondary | ICD-10-CM

## 2018-09-26 DIAGNOSIS — E1122 Type 2 diabetes mellitus with diabetic chronic kidney disease: Secondary | ICD-10-CM | POA: Diagnosis present

## 2018-09-26 DIAGNOSIS — F17210 Nicotine dependence, cigarettes, uncomplicated: Secondary | ICD-10-CM | POA: Diagnosis not present

## 2018-09-26 DIAGNOSIS — Z419 Encounter for procedure for purposes other than remedying health state, unspecified: Secondary | ICD-10-CM

## 2018-09-26 DIAGNOSIS — D631 Anemia in chronic kidney disease: Secondary | ICD-10-CM | POA: Diagnosis not present

## 2018-09-26 DIAGNOSIS — G4733 Obstructive sleep apnea (adult) (pediatric): Secondary | ICD-10-CM | POA: Diagnosis not present

## 2018-09-26 DIAGNOSIS — G9349 Other encephalopathy: Secondary | ICD-10-CM | POA: Insufficient documentation

## 2018-09-26 DIAGNOSIS — U071 COVID-19: Secondary | ICD-10-CM

## 2018-09-26 DIAGNOSIS — Z8349 Family history of other endocrine, nutritional and metabolic diseases: Secondary | ICD-10-CM

## 2018-09-26 DIAGNOSIS — E785 Hyperlipidemia, unspecified: Secondary | ICD-10-CM | POA: Diagnosis present

## 2018-09-26 DIAGNOSIS — Z452 Encounter for adjustment and management of vascular access device: Secondary | ICD-10-CM

## 2018-09-26 DIAGNOSIS — Z809 Family history of malignant neoplasm, unspecified: Secondary | ICD-10-CM | POA: Diagnosis not present

## 2018-09-26 DIAGNOSIS — E1169 Type 2 diabetes mellitus with other specified complication: Secondary | ICD-10-CM | POA: Diagnosis present

## 2018-09-26 DIAGNOSIS — I152 Hypertension secondary to endocrine disorders: Secondary | ICD-10-CM | POA: Diagnosis present

## 2018-09-26 DIAGNOSIS — Y832 Surgical operation with anastomosis, bypass or graft as the cause of abnormal reaction of the patient, or of later complication, without mention of misadventure at the time of the procedure: Secondary | ICD-10-CM | POA: Diagnosis present

## 2018-09-26 DIAGNOSIS — Z95828 Presence of other vascular implants and grafts: Secondary | ICD-10-CM

## 2018-09-26 DIAGNOSIS — D649 Anemia, unspecified: Secondary | ICD-10-CM | POA: Diagnosis present

## 2018-09-26 DIAGNOSIS — E1152 Type 2 diabetes mellitus with diabetic peripheral angiopathy with gangrene: Secondary | ICD-10-CM | POA: Diagnosis present

## 2018-09-26 DIAGNOSIS — G8929 Other chronic pain: Secondary | ICD-10-CM | POA: Diagnosis not present

## 2018-09-26 DIAGNOSIS — T82898A Other specified complication of vascular prosthetic devices, implants and grafts, initial encounter: Principal | ICD-10-CM | POA: Diagnosis present

## 2018-09-26 DIAGNOSIS — N186 End stage renal disease: Secondary | ICD-10-CM | POA: Diagnosis not present

## 2018-09-26 DIAGNOSIS — Z823 Family history of stroke: Secondary | ICD-10-CM | POA: Diagnosis not present

## 2018-09-26 DIAGNOSIS — Z8249 Family history of ischemic heart disease and other diseases of the circulatory system: Secondary | ICD-10-CM | POA: Diagnosis not present

## 2018-09-26 DIAGNOSIS — G894 Chronic pain syndrome: Secondary | ICD-10-CM | POA: Diagnosis present

## 2018-09-26 DIAGNOSIS — Z6835 Body mass index (BMI) 35.0-35.9, adult: Secondary | ICD-10-CM | POA: Diagnosis not present

## 2018-09-26 DIAGNOSIS — I1311 Hypertensive heart and chronic kidney disease without heart failure, with stage 5 chronic kidney disease, or end stage renal disease: Secondary | ICD-10-CM | POA: Diagnosis present

## 2018-09-26 DIAGNOSIS — I10 Essential (primary) hypertension: Secondary | ICD-10-CM | POA: Diagnosis present

## 2018-09-26 DIAGNOSIS — E1165 Type 2 diabetes mellitus with hyperglycemia: Secondary | ICD-10-CM | POA: Diagnosis present

## 2018-09-26 DIAGNOSIS — Z794 Long term (current) use of insulin: Secondary | ICD-10-CM | POA: Diagnosis not present

## 2018-09-26 DIAGNOSIS — I2510 Atherosclerotic heart disease of native coronary artery without angina pectoris: Secondary | ICD-10-CM | POA: Diagnosis not present

## 2018-09-26 DIAGNOSIS — Z992 Dependence on renal dialysis: Secondary | ICD-10-CM | POA: Diagnosis not present

## 2018-09-26 DIAGNOSIS — E1159 Type 2 diabetes mellitus with other circulatory complications: Secondary | ICD-10-CM | POA: Diagnosis present

## 2018-09-26 DIAGNOSIS — I96 Gangrene, not elsewhere classified: Secondary | ICD-10-CM | POA: Diagnosis present

## 2018-09-26 DIAGNOSIS — E669 Obesity, unspecified: Secondary | ICD-10-CM | POA: Diagnosis present

## 2018-09-26 HISTORY — PX: INSERTION OF DIALYSIS CATHETER: SHX1324

## 2018-09-26 HISTORY — PX: LIGATION OF ARTERIOVENOUS  FISTULA: SHX5948

## 2018-09-26 HISTORY — DX: Acute respiratory failure with hypoxia: J96.01

## 2018-09-26 LAB — POCT I-STAT 4, (NA,K, GLUC, HGB,HCT)
Glucose, Bld: 159 mg/dL — ABNORMAL HIGH (ref 70–99)
HCT: 26 % — ABNORMAL LOW (ref 36.0–46.0)
Hemoglobin: 8.8 g/dL — ABNORMAL LOW (ref 12.0–15.0)
Potassium: 3.8 mmol/L (ref 3.5–5.1)
Sodium: 134 mmol/L — ABNORMAL LOW (ref 135–145)

## 2018-09-26 LAB — GLUCOSE, CAPILLARY
Glucose-Capillary: 148 mg/dL — ABNORMAL HIGH (ref 70–99)
Glucose-Capillary: 124 mg/dL — ABNORMAL HIGH (ref 70–99)
Glucose-Capillary: 183 mg/dL — ABNORMAL HIGH (ref 70–99)
Glucose-Capillary: 201 mg/dL — ABNORMAL HIGH (ref 70–99)

## 2018-09-26 LAB — CREATININE, SERUM
Creatinine, Ser: 4.98 mg/dL — ABNORMAL HIGH (ref 0.44–1.00)
GFR calc Af Amer: 11 mL/min — ABNORMAL LOW (ref >60)
GFR calc non Af Amer: 9 mL/min — ABNORMAL LOW (ref >60)

## 2018-09-26 LAB — COMPREHENSIVE METABOLIC PANEL
ALT: 16 U/L (ref 0–44)
AST: 20 U/L (ref 15–41)
Albumin: 2.8 g/dL — ABNORMAL LOW (ref 3.5–5.0)
Alkaline Phosphatase: 63 U/L (ref 38–126)
Anion gap: 15 (ref 5–15)
BUN: 19 mg/dL (ref 6–20)
CO2: 28 mmol/L (ref 22–32)
Calcium: 8.4 mg/dL — ABNORMAL LOW (ref 8.9–10.3)
Chloride: 93 mmol/L — ABNORMAL LOW (ref 98–111)
Creatinine, Ser: 4.99 mg/dL — ABNORMAL HIGH (ref 0.44–1.00)
GFR calc Af Amer: 11 mL/min — ABNORMAL LOW (ref >60)
GFR calc non Af Amer: 9 mL/min — ABNORMAL LOW (ref >60)
Glucose, Bld: 182 mg/dL — ABNORMAL HIGH (ref 70–99)
Potassium: 4.2 mmol/L (ref 3.5–5.1)
Sodium: 136 mmol/L (ref 135–145)
Total Bilirubin: 0.8 mg/dL (ref 0.3–1.2)
Total Protein: 7.5 g/dL (ref 6.5–8.1)

## 2018-09-26 LAB — CBC
HCT: 23.8 % — ABNORMAL LOW (ref 36.0–46.0)
Hemoglobin: 7.3 g/dL — ABNORMAL LOW (ref 12.0–15.0)
MCH: 31.3 pg (ref 26.0–34.0)
MCHC: 30.7 g/dL (ref 30.0–36.0)
MCV: 102.1 fL — ABNORMAL HIGH (ref 80.0–100.0)
Platelets: 196 10*3/uL (ref 150–400)
RBC: 2.33 MIL/uL — ABNORMAL LOW (ref 3.87–5.11)
RDW: 16.7 % — ABNORMAL HIGH (ref 11.5–15.5)
WBC: 8.4 10*3/uL (ref 4.0–10.5)
nRBC: 0 % (ref 0.0–0.2)

## 2018-09-26 LAB — D-DIMER, QUANTITATIVE: D-Dimer, Quant: 3.46 ug{FEU}/mL — ABNORMAL HIGH (ref 0.00–0.50)

## 2018-09-26 LAB — C-REACTIVE PROTEIN: CRP: 10.5 mg/dL — ABNORMAL HIGH

## 2018-09-26 LAB — LACTATE DEHYDROGENASE: LDH: 233 U/L — ABNORMAL HIGH (ref 98–192)

## 2018-09-26 LAB — FIBRINOGEN: Fibrinogen: 706 mg/dL — ABNORMAL HIGH (ref 210–475)

## 2018-09-26 LAB — FERRITIN: Ferritin: 1593 ng/mL — ABNORMAL HIGH (ref 11–307)

## 2018-09-26 LAB — PROCALCITONIN: Procalcitonin: 0.29 ng/mL

## 2018-09-26 SURGERY — LIGATION OF ARTERIOVENOUS  FISTULA
Anesthesia: General | Site: Chest | Laterality: Right

## 2018-09-26 MED ORDER — ONDANSETRON HCL 4 MG/2ML IJ SOLN: 4.0000 mg | Freq: Once | INTRAMUSCULAR | Status: DC | PRN

## 2018-09-26 MED ORDER — ONDANSETRON HCL 4 MG/2ML IJ SOLN
INTRAMUSCULAR | Status: DC | PRN
  Administered 2018-09-26: 4 mg via INTRAVENOUS

## 2018-09-26 MED ORDER — CEFAZOLIN SODIUM-DEXTROSE 2-4 GM/100ML-% IV SOLN
INTRAVENOUS | Status: AC
  Filled 2018-09-26: qty 100

## 2018-09-26 MED ORDER — DIPHENOXYLATE-ATROPINE 2.5-0.025 MG PO TABS: 1.0000 | ORAL_TABLET | Freq: Four times a day (QID) | ORAL | Status: DC | PRN

## 2018-09-26 MED ORDER — HYDROXYZINE HCL 25 MG PO TABS
25.0000 mg | ORAL_TABLET | Freq: Every day | ORAL | Status: DC
  Administered 2018-09-27: 25 mg via ORAL
  Filled 2018-09-26: qty 1

## 2018-09-26 MED ORDER — SUCCINYLCHOLINE CHLORIDE 20 MG/ML IJ SOLN
INTRAMUSCULAR | Status: DC | PRN
  Administered 2018-09-26: 140 mg via INTRAVENOUS

## 2018-09-26 MED ORDER — LIDOCAINE 2% (20 MG/ML) 5 ML SYRINGE
INTRAMUSCULAR | Status: DC | PRN
  Administered 2018-09-26: 50 mg via INTRAVENOUS

## 2018-09-26 MED ORDER — METHYLPREDNISOLONE SODIUM SUCC 125 MG IJ SOLR
50.0000 mg | Freq: Two times a day (BID) | INTRAMUSCULAR | Status: DC
  Administered 2018-09-26 – 2018-09-27 (×2): 50 mg via INTRAVENOUS
  Filled 2018-09-26 (×2): qty 2

## 2018-09-26 MED ORDER — HEPARIN SODIUM (PORCINE) 1000 UNIT/ML IJ SOLN
INTRAMUSCULAR | Status: AC
  Filled 2018-09-26: qty 1

## 2018-09-26 MED ORDER — METOPROLOL TARTRATE 5 MG/5ML IV SOLN: 2.0000 mg | INTRAVENOUS | Status: DC | PRN

## 2018-09-26 MED ORDER — SODIUM CHLORIDE 0.9 % IV SOLN
INTRAVENOUS | Status: AC
  Filled 2018-09-26: qty 1.2

## 2018-09-26 MED ORDER — EPHEDRINE SULFATE 50 MG/ML IJ SOLN
INTRAMUSCULAR | Status: DC | PRN
  Administered 2018-09-26: 15 mg via INTRAVENOUS
  Administered 2018-09-26: 10 mg via INTRAVENOUS

## 2018-09-26 MED ORDER — NALOXONE HCL 0.4 MG/ML IJ SOLN
INTRAMUSCULAR | Status: AC
  Administered 2018-09-26: 0.4 mg
  Filled 2018-09-26: qty 1

## 2018-09-26 MED ORDER — PROPOFOL 10 MG/ML IV BOLUS
INTRAVENOUS | Status: AC
  Filled 2018-09-26: qty 20

## 2018-09-26 MED ORDER — HEPARIN SODIUM (PORCINE) 1000 UNIT/ML IJ SOLN
INTRAMUSCULAR | Status: DC | PRN
  Administered 2018-09-26: 3.4 [IU] via INTRAVENOUS

## 2018-09-26 MED ORDER — ALUM & MAG HYDROXIDE-SIMETH 200-200-20 MG/5ML PO SUSP: 15.0000 mL | ORAL | Status: DC | PRN

## 2018-09-26 MED ORDER — INSULIN ASPART 100 UNIT/ML ~~LOC~~ SOLN
0.0000 [IU] | Freq: Three times a day (TID) | SUBCUTANEOUS | Status: DC
  Administered 2018-09-26: 2 [IU] via SUBCUTANEOUS
  Administered 2018-09-27: 5 [IU] via SUBCUTANEOUS
  Administered 2018-09-27: 9 [IU] via SUBCUTANEOUS

## 2018-09-26 MED ORDER — INSULIN LISPRO PROT & LISPRO (75-25 MIX) 100 UNIT/ML KWIKPEN: 30.0000 [IU] | PEN_INJECTOR | Freq: Two times a day (BID) | SUBCUTANEOUS | Status: DC

## 2018-09-26 MED ORDER — OXYCODONE HCL 5 MG PO TABS: 15.0000 mg | ORAL_TABLET | Freq: Four times a day (QID) | ORAL | Status: DC | PRN

## 2018-09-26 MED ORDER — METOPROLOL SUCCINATE ER 25 MG PO TB24
25.0000 mg | ORAL_TABLET | Freq: Every day | ORAL | Status: DC
  Administered 2018-09-27: 25 mg via ORAL
  Filled 2018-09-26 (×2): qty 1

## 2018-09-26 MED ORDER — FERRIC CITRATE 1 GM 210 MG(FE) PO TABS
630.0000 mg | ORAL_TABLET | Freq: Three times a day (TID) | ORAL | Status: DC
  Administered 2018-09-26 – 2018-09-27 (×3): 630 mg via ORAL
  Filled 2018-09-26 (×4): qty 3

## 2018-09-26 MED ORDER — SODIUM CHLORIDE 0.9 % IV SOLN: 250.0000 mL | INTRAVENOUS | Status: DC | PRN

## 2018-09-26 MED ORDER — FENTANYL CITRATE (PF) 100 MCG/2ML IJ SOLN
INTRAMUSCULAR | Status: DC | PRN
  Administered 2018-09-26: 50 ug via INTRAVENOUS

## 2018-09-26 MED ORDER — ATORVASTATIN CALCIUM 80 MG PO TABS
80.0000 mg | ORAL_TABLET | Freq: Every day | ORAL | Status: DC
  Administered 2018-09-26: 80 mg via ORAL
  Filled 2018-09-26: qty 1

## 2018-09-26 MED ORDER — DOCUSATE SODIUM 100 MG PO CAPS: 100.0000 mg | ORAL_CAPSULE | Freq: Two times a day (BID) | ORAL | Status: DC

## 2018-09-26 MED ORDER — ONDANSETRON HCL 4 MG/2ML IJ SOLN: 4.0000 mg | Freq: Four times a day (QID) | INTRAMUSCULAR | Status: DC | PRN

## 2018-09-26 MED ORDER — ZINC SULFATE 220 (50 ZN) MG PO CAPS
220.0000 mg | ORAL_CAPSULE | Freq: Every day | ORAL | Status: DC
  Administered 2018-09-27: 220 mg via ORAL
  Filled 2018-09-26: qty 1

## 2018-09-26 MED ORDER — SODIUM CHLORIDE 0.9% FLUSH
3.0000 mL | Freq: Two times a day (BID) | INTRAVENOUS | Status: DC
  Administered 2018-09-26 – 2018-09-27 (×2): 3 mL via INTRAVENOUS

## 2018-09-26 MED ORDER — 0.9 % SODIUM CHLORIDE (POUR BTL) OPTIME
TOPICAL | Status: DC | PRN
  Administered 2018-09-26: 13:00:00 1000 mL

## 2018-09-26 MED ORDER — PANTOPRAZOLE SODIUM 40 MG PO TBEC
40.0000 mg | DELAYED_RELEASE_TABLET | Freq: Every day | ORAL | Status: DC
  Administered 2018-09-27: 40 mg via ORAL
  Filled 2018-09-26: qty 1

## 2018-09-26 MED ORDER — GUAIFENESIN-DM 100-10 MG/5ML PO SYRP
15.0000 mL | ORAL_SOLUTION | ORAL | Status: DC | PRN
  Administered 2018-09-26 – 2018-09-27 (×2): 15 mL via ORAL
  Filled 2018-09-26 (×3): qty 15

## 2018-09-26 MED ORDER — ONDANSETRON HCL 4 MG/2ML IJ SOLN
INTRAMUSCULAR | Status: AC
  Filled 2018-09-26: qty 2

## 2018-09-26 MED ORDER — SODIUM CHLORIDE 0.9% FLUSH: 3.0000 mL | INTRAVENOUS | Status: DC | PRN

## 2018-09-26 MED ORDER — OXYCODONE HCL 5 MG PO TABS: 5.0000 mg | ORAL_TABLET | Freq: Once | ORAL | Status: DC | PRN

## 2018-09-26 MED ORDER — PRAMIPEXOLE DIHYDROCHLORIDE 0.25 MG PO TABS
0.5000 mg | ORAL_TABLET | Freq: Every day | ORAL | Status: DC
  Administered 2018-09-26 – 2018-09-27 (×2): 0.5 mg via ORAL
  Filled 2018-09-26 (×2): qty 2

## 2018-09-26 MED ORDER — OXYCODONE HCL 5 MG/5ML PO SOLN: 5.0000 mg | Freq: Once | ORAL | Status: DC | PRN

## 2018-09-26 MED ORDER — LABETALOL HCL 5 MG/ML IV SOLN: 10.0000 mg | INTRAVENOUS | Status: DC | PRN

## 2018-09-26 MED ORDER — HYDROMORPHONE HCL 1 MG/ML IJ SOLN: 0.5000 mg | INTRAMUSCULAR | Status: DC | PRN

## 2018-09-26 MED ORDER — PHENYLEPHRINE 40 MCG/ML (10ML) SYRINGE FOR IV PUSH (FOR BLOOD PRESSURE SUPPORT)
PREFILLED_SYRINGE | INTRAVENOUS | Status: AC
  Filled 2018-09-26: qty 10

## 2018-09-26 MED ORDER — SODIUM CHLORIDE 0.9 % IV SOLN
INTRAVENOUS | Status: DC | PRN
  Administered 2018-09-26: 13:00:00 500 mL

## 2018-09-26 MED ORDER — HYDRALAZINE HCL 20 MG/ML IJ SOLN: 5.0000 mg | INTRAMUSCULAR | Status: DC | PRN

## 2018-09-26 MED ORDER — PROPOFOL 10 MG/ML IV BOLUS
INTRAVENOUS | Status: DC | PRN
  Administered 2018-09-26: 140 mg via INTRAVENOUS

## 2018-09-26 MED ORDER — DEXAMETHASONE SODIUM PHOSPHATE 10 MG/ML IJ SOLN
INTRAMUSCULAR | Status: AC
  Filled 2018-09-26: qty 1

## 2018-09-26 MED ORDER — CEFAZOLIN SODIUM-DEXTROSE 2-4 GM/100ML-% IV SOLN
2.0000 g | INTRAVENOUS | Status: AC
  Administered 2018-09-26: 11:00:00 2 g via INTRAVENOUS

## 2018-09-26 MED ORDER — NALOXONE HCL 0.4 MG/ML IJ SOLN: 0.4000 mg | INTRAMUSCULAR | Status: DC | PRN

## 2018-09-26 MED ORDER — POTASSIUM CHLORIDE CRYS ER 20 MEQ PO TBCR
20.0000 meq | EXTENDED_RELEASE_TABLET | Freq: Once | ORAL | Status: DC
  Filled 2018-09-26: qty 1

## 2018-09-26 MED ORDER — DEXAMETHASONE SODIUM PHOSPHATE 10 MG/ML IJ SOLN
INTRAMUSCULAR | Status: DC | PRN
  Administered 2018-09-26: 4 mg via INTRAVENOUS

## 2018-09-26 MED ORDER — FENTANYL CITRATE (PF) 250 MCG/5ML IJ SOLN
INTRAMUSCULAR | Status: AC
  Filled 2018-09-26: qty 5

## 2018-09-26 MED ORDER — FENTANYL CITRATE (PF) 100 MCG/2ML IJ SOLN: 25.0000 ug | INTRAMUSCULAR | Status: DC | PRN

## 2018-09-26 MED ORDER — ENOXAPARIN SODIUM 30 MG/0.3ML ~~LOC~~ SOLN: 30.0000 mg | SUBCUTANEOUS | Status: DC

## 2018-09-26 MED ORDER — PHENYLEPHRINE HCL (PRESSORS) 10 MG/ML IV SOLN
INTRAVENOUS | Status: DC | PRN
  Administered 2018-09-26 (×3): 120 ug via INTRAVENOUS

## 2018-09-26 MED ORDER — SODIUM CHLORIDE 0.9 % IV SOLN
INTRAVENOUS | Status: DC | PRN
  Administered 2018-09-26: 11:00:00 50 ug/min via INTRAVENOUS

## 2018-09-26 MED ORDER — PHENOL 1.4 % MT LIQD: 1.0000 | OROMUCOSAL | Status: DC | PRN

## 2018-09-26 MED ORDER — GABAPENTIN 100 MG PO CAPS
100.0000 mg | ORAL_CAPSULE | Freq: Every day | ORAL | Status: DC
  Administered 2018-09-26 – 2018-09-27 (×2): 100 mg via ORAL
  Filled 2018-09-26 (×2): qty 1

## 2018-09-26 MED ORDER — SODIUM CHLORIDE 0.9 % IV SOLN
INTRAVENOUS | Status: DC
  Administered 2018-09-26 (×2): via INTRAVENOUS

## 2018-09-26 MED ORDER — SENNOSIDES-DOCUSATE SODIUM 8.6-50 MG PO TABS: 1.0000 | ORAL_TABLET | Freq: Every evening | ORAL | Status: DC | PRN

## 2018-09-26 MED ORDER — INSULIN ASPART PROT & ASPART (70-30 MIX) 100 UNIT/ML ~~LOC~~ SUSP
30.0000 [IU] | Freq: Two times a day (BID) | SUBCUTANEOUS | Status: DC
  Administered 2018-09-26 – 2018-09-27 (×2): 30 [IU] via SUBCUTANEOUS
  Filled 2018-09-26: qty 10

## 2018-09-26 SURGICAL SUPPLY — 68 items
ADH SKN CLS APL DERMABOND .7 (GAUZE/BANDAGES/DRESSINGS) ×4
AGENT HMST SPONGE THK3/8 (HEMOSTASIS)
APL PRP STRL LF DISP 70% ISPRP (MISCELLANEOUS) ×2
BAG DECANTER FOR FLEXI CONT (MISCELLANEOUS) ×3 IMPLANT
BIOPATCH RED 1 DISK 7.0 (GAUZE/BANDAGES/DRESSINGS) ×3 IMPLANT
BNDG COHESIVE 2X5 TAN STRL LF (GAUZE/BANDAGES/DRESSINGS) ×1 IMPLANT
CANISTER SUCT 3000ML PPV (MISCELLANEOUS) ×3 IMPLANT
CANNULA VESSEL 3MM 2 BLNT TIP (CANNULA) ×2 IMPLANT
CATH PALINDROME RT-P 15FX19CM (CATHETERS) IMPLANT
CATH PALINDROME RT-P 15FX23CM (CATHETERS) ×1 IMPLANT
CATH PALINDROME RT-P 15FX28CM (CATHETERS) IMPLANT
CATH PALINDROME RT-P 15FX55CM (CATHETERS) IMPLANT
CATH STRAIGHT 5FR 65CM (CATHETERS) IMPLANT
CHLORAPREP W/TINT 26 (MISCELLANEOUS) ×3 IMPLANT
CLIP VESOCCLUDE MED 6/CT (CLIP) ×1 IMPLANT
CLIP VESOCCLUDE SM WIDE 6/CT (CLIP) ×1 IMPLANT
COVER PROBE W GEL 5X96 (DRAPES) IMPLANT
COVER SURGICAL LIGHT HANDLE (MISCELLANEOUS) ×3 IMPLANT
COVER WAND RF STERILE (DRAPES) ×3 IMPLANT
DECANTER SPIKE VIAL GLASS SM (MISCELLANEOUS) IMPLANT
DERMABOND ADVANCED (GAUZE/BANDAGES/DRESSINGS) ×2
DERMABOND ADVANCED .7 DNX12 (GAUZE/BANDAGES/DRESSINGS) ×2 IMPLANT
DRAPE C-ARM 42X72 X-RAY (DRAPES) ×3 IMPLANT
DRAPE CHEST BREAST 15X10 FENES (DRAPES) ×3 IMPLANT
ELECT REM PT RETURN 9FT ADLT (ELECTROSURGICAL) ×3
ELECTRODE REM PT RTRN 9FT ADLT (ELECTROSURGICAL) ×2 IMPLANT
GAUZE 4X4 16PLY RFD (DISPOSABLE) ×3 IMPLANT
GAUZE SPONGE 4X4 12PLY STRL (GAUZE/BANDAGES/DRESSINGS) ×1 IMPLANT
GLOVE BIO SURGEON STRL SZ 6.5 (GLOVE) ×1 IMPLANT
GLOVE BIO SURGEON STRL SZ7 (GLOVE) ×1 IMPLANT
GLOVE BIO SURGEON STRL SZ7.5 (GLOVE) ×4 IMPLANT
GLOVE BIOGEL PI IND STRL 7.0 (GLOVE) IMPLANT
GLOVE BIOGEL PI INDICATOR 7.0 (GLOVE) ×3
GLOVE BIOGEL PI ORTHO PRO 7.5 (GLOVE) ×1
GLOVE PI ORTHO PRO STRL 7.5 (GLOVE) IMPLANT
GOWN STRL REUS W/ TWL LRG LVL3 (GOWN DISPOSABLE) ×6 IMPLANT
GOWN STRL REUS W/TWL LRG LVL3 (GOWN DISPOSABLE) ×9
HEMOSTAT SPONGE AVITENE ULTRA (HEMOSTASIS) IMPLANT
KIT BASIN OR (CUSTOM PROCEDURE TRAY) ×3 IMPLANT
KIT TURNOVER KIT B (KITS) ×3 IMPLANT
LOOP VESSEL MINI RED (MISCELLANEOUS) IMPLANT
MARKER SKIN DUAL TIP RULER LAB (MISCELLANEOUS) ×1 IMPLANT
NDL 18GX1X1/2 (RX/OR ONLY) (NEEDLE) ×2 IMPLANT
NDL HYPO 25GX1X1/2 BEV (NEEDLE) ×2 IMPLANT
NEEDLE 18GX1X1/2 (RX/OR ONLY) (NEEDLE) ×6 IMPLANT
NEEDLE HYPO 25GX1X1/2 BEV (NEEDLE) ×3 IMPLANT
NS IRRIG 1000ML POUR BTL (IV SOLUTION) ×3 IMPLANT
PACK CV ACCESS (CUSTOM PROCEDURE TRAY) ×3 IMPLANT
PACK SURGICAL SETUP 50X90 (CUSTOM PROCEDURE TRAY) ×3 IMPLANT
PAD ARMBOARD 7.5X6 YLW CONV (MISCELLANEOUS) ×6 IMPLANT
PENCIL SMOKE EVACUATOR (MISCELLANEOUS) ×1 IMPLANT
SET MICROPUNCTURE 5F STIFF (MISCELLANEOUS) IMPLANT
SUT ETHILON 3 0 PS 1 (SUTURE) ×3 IMPLANT
SUT PROLENE 5 0 C 1 24 (SUTURE) ×1 IMPLANT
SUT PROLENE 6 0 CC (SUTURE) IMPLANT
SUT SILK 0 TIES 10X30 (SUTURE) ×1 IMPLANT
SUT VIC AB 3-0 SH 27 (SUTURE) ×3
SUT VIC AB 3-0 SH 27X BRD (SUTURE) ×2 IMPLANT
SUT VICRYL 4-0 PS2 18IN ABS (SUTURE) ×3 IMPLANT
SYR 10ML LL (SYRINGE) ×4 IMPLANT
SYR 20ML LL LF (SYRINGE) ×6 IMPLANT
SYR 5ML LL (SYRINGE) ×3 IMPLANT
SYR CONTROL 10ML LL (SYRINGE) ×3 IMPLANT
TOWEL GREEN STERILE (TOWEL DISPOSABLE) ×6 IMPLANT
TOWEL GREEN STERILE FF (TOWEL DISPOSABLE) ×3 IMPLANT
UNDERPAD 30X30 (UNDERPADS AND DIAPERS) ×3 IMPLANT
WATER STERILE IRR 1000ML POUR (IV SOLUTION) ×3 IMPLANT
WIRE AMPLATZ SS-J .035X180CM (WIRE) IMPLANT

## 2018-09-26 NOTE — Op Note (Signed)
Procedure: Ultrasound-guided insertion of Palidrome catheter, ligation left arm AV fistula  Preoperative diagnosis: End-stage renal disease, ischemic steal left hand  Postoperative diagnosis: Same  Anesthesia: General  Operative findings: 23 cm Palindrome catheter right internal jugular vein  Operative details: After obtaining informed consent, the patient was taken to the operating room. The patient was placed in supine position on the operating room table. After maintained general anesthesia the patient's entire neck and chest were prepped and draped in usual sterile fashion. The patient was placed in Trendelenburg position. Ultrasound was used to identify the patient's right internal jugular vein. This had normal compressibility and respiratory variation. Local anesthesia was infiltrated over the right jugular vein.  Using ultrasound guidance, the right internal jugular vein was successfully cannulated.  A 0.035 J-tipped guidewire was threaded into the right internal jugular vein and into the superior vena cava followed by the inferior vena cava under fluoroscopic guidance.   Next sequential 12 and 14 dilators were placed over the guidewire into the right atrium.  A 16 French dilator with a peel-away sheath was then placed over the guidewire into the right atrium.   The guidewire and dilator were removed. A 23 cm Palindrome catheter was then placed through the peel away sheath into the right atrium.  The catheter was then tunneled subcutaneously, cut to length, and the hub attached. The catheter was noted to flush and draw easily. The catheter was inspected under fluoroscopy and found with its tip to be in the right atrium without any kinks throughout its course. The catheter was sutured to the skin with nylon sutures. The neck insertion site was closed with Vicryl stitch. The catheter was then loaded with concentrated Heparin solution. A dry sterile dressing was applied.  Next attention was turned to  the patient's left upper extremity.  This was prepped and draped in usual sterile fashion from the upper arm to the wrist.  Longitudinal incision was made through pre-existing scar near the antecubital area.  Incision was carried down through the subcutaneous tissues down the level of fistula.  The fistula was dissected free circumferentially.  It was then ligated with 2 separate 0 silk ties.  There was no further flow in the fistula.  Subcutaneous tissues were reapproximated using running 3-0 Vicryl suture.  Skin was closed with a 4-0 Vicryl subcuticular stitch.  Patient was noted to have a palpable radial pulse at this point.  There was biphasic radial and ulnar Doppler flow.  Dermabond was applied to the skin.  The patient tolerated procedure well and there were no complications. Instrument sponge and needle counts correct in the case. The patient was taken to the recovery room in stable condition. Chest x-ray will be obtained in the recovery room.  Ruta Hinds, MD [facility] [place] Office: [phone] Pager: [phone]

## 2018-09-26 NOTE — Transfer of Care (Signed)
Immediate Anesthesia Transfer of Care Note  Patient: Nyhla Mountjoy  Procedure(s) Performed: LIGATION OF ARTERIOVENOUS  FISTULA LEFT ARM (Left Arm Upper) INSERTION OF DIALYSIS CATHETER Right Internal Jugular. (Right Chest)  Patient Location: PACU  Anesthesia Type:General  Level of Consciousness: awake and drowsy  Airway & Oxygen Therapy: Patient Spontanous Breathing and Patient connected to face mask oxygen  Post-op Assessment: Report given to RN and Post -op Vital signs reviewed and stable  Post vital signs: Reviewed and stable  Last Vitals:  Vitals Value Taken Time  BP    Temp    Pulse    Resp    SpO2      Last Pain:  Vitals:   09/26/18 1420  TempSrc:   PainSc: 0-No pain      Patients Stated Pain Goal: 2 (29/24/46 2863)  Complications: No apparent anesthesia complications

## 2018-09-26 NOTE — Interval H&P Note (Signed)
History and Physical Interval Note:  09/26/2018 10:02 AM  [name]  has presented today for surgery, with the diagnosis of STEAL SYNDROME, NECROTIC LEFT FINGERS.  The various methods of treatment have been discussed with the patient and family. After consideration of risks, benefits and other options for treatment, the patient has consented to  Procedure(s): LIGATION OF ARTERIOVENOUS  FISTULA LEFT ARM (Left) INSERTION OF DIALYSIS CATHETER (N/A) as a surgical intervention.  The patient's history has been reviewed, patient examined, no change in status, stable for surgery.  I have reviewed the patient's chart and labs.  Questions were answered to the patient's satisfaction.     Ruta Hinds

## 2018-09-26 NOTE — Anesthesia Postprocedure Evaluation (Signed)
Anesthesia Post Note  Patient: [name]  Procedure(s) Performed: LIGATION OF ARTERIOVENOUS  FISTULA LEFT ARM (Left Arm Upper) INSERTION OF DIALYSIS CATHETER Right Internal Jugular. (Right Chest)     Patient location during evaluation: Other Anesthesia Type: General Level of consciousness: awake and alert Pain management: pain level controlled Vital Signs Assessment: post-procedure vital signs reviewed and stable Respiratory status: spontaneous breathing, nonlabored ventilation and patient connected to face mask oxygen Cardiovascular status: blood pressure returned to baseline and stable Postop Assessment: no apparent nausea or vomiting Anesthetic complications: no Comments: Patient with labored  Respirations. Requiring mask O2 to maintain Sat> 90%. Plan to admit for overnight observation.    Last Vitals:  Vitals:   09/26/18 1518 09/26/18 1519  BP:    Pulse:    Resp:    Temp:    SpO2: (!) 87% 94%    Last Pain:  Vitals:   09/26/18 1459  TempSrc: Axillary  PainSc: 0-No pain                 Ailee Pates COKER

## 2018-09-26 NOTE — OR Nursing (Signed)
O2 sats continue to read 79-82%.  Contacted Dr. Linna Caprice.  Orders received to contact Dr. Oneida Alar to admit patient for observation. Dr. Oneida Alar contacted and received verbal order to admit for observation and contact PA for admission orders.

## 2018-09-26 NOTE — Anesthesia Procedure Notes (Signed)
Procedure Name: Intubation Date/Time: 09/26/2018 10:54 AM Performed by: Inda Coke, CRNA Pre-anesthesia Checklist: Patient identified, Emergency Drugs available, Suction available and Patient being monitored Patient Re-evaluated:Patient Re-evaluated prior to induction Oxygen Delivery Method: Circle System Utilized Preoxygenation: Pre-oxygenation with 100% oxygen Induction Type: IV induction and Rapid sequence Laryngoscope Size: Mac and 3 Grade View: Grade I Tube type: Oral Tube size: 7.0 mm Number of attempts: 1 Airway Equipment and Method: Stylet and Oral airway Placement Confirmation: ETT inserted through vocal cords under direct vision,  positive ETCO2 and breath sounds checked- equal and bilateral Secured at: 21 cm Tube secured with: Tape Dental Injury: Teeth and Oropharynx as per pre-operative assessment

## 2018-09-26 NOTE — OR Nursing (Signed)
At 1240, intraoperative phase complete.  Due to COVID + status, patient recovered in OR 10 with PACU, RN

## 2018-09-26 NOTE — Progress Notes (Signed)
I have been by room to check on patient multiple time. Patient denies needs.

## 2018-09-26 NOTE — Progress Notes (Signed)
Asked by day MD to f/up on pt's Covid markers. They are elevated. Her Covid dx was 09/14/2018, so it is a little surprising that the markers are still high. We will start her on Solumedrol and recheck her markers in the a.m.  Her CXR shows alveolar edema with ? Superimposed PNA. She is Afebrile, her WBCC is not elevated, and her procalcitonin is not high. Therefore, we will hold off on starting antibiotics. The patient was very somulent postop and responded to Narcan. Day MD thought this was likely due to anesthesia. Pt is awake, alert and asking for food now. Her VS were reviewed and are stable.  KJKG, NP [facility]

## 2018-09-26 NOTE — Discharge Instructions (Signed)

## 2018-09-26 NOTE — Significant Event (Signed)
Rapid Response Event Note  Overview: Neurologic - AMS - Decreased LOC  Initial Focused Assessment: Called by 2W nursing staff to assess patient for decreased LOC and altered mental status. Patient is s/p AVF ligation LT ARM and THDC placement. Per nurse, patient is hard to arouse and has been since arrival to 2W from PACU. Nurses did not have a baseline neuro exam because patient came in for the procedure.   I asked the nurses to give Narcan 0.4 mg IV x 1 and I saw the patient. Ms. Vallo was hard to arouse with verbal stimuli but did arouse to noxious stimuli. Once awake, she was able to tell me her name and followed simple commands (able to squeeze hands/lift legs). Not in acute distress. Breathing comfortably, 99% on 5L [place] - RR 18-20, mild obstruction - sleep apnea ? Lung sounds - clear in the upper fields, diminished in the lower fields. Stable HR and B. Another dose of Narcan 0.4 mg IV x 1 was given. Patient was still quickly following asleep. Normal blood sugar   Interventions: - Narcan 0.4 mg IV x 2   Plan of Care: - I updated the VVS PA - TRH MD has been consulted and will come see the patient as well.  - Monitor Neuro Status   Event Summary:  Call Time 1536  Arrival Time 1537 End Time 1630  Morene Cecilio R

## 2018-09-26 NOTE — Consult Note (Signed)
Medical Consultation   [name]  ONG:[id number]  DOB: January 07, 1966  DOA: 09/26/2018  PCP: Sandi Mariscal, MD   Outpatient Specialists: Fields - vascular; Xu - orthopedics; Hochrein - cardiology   Requesting physician: [facility]  Reason for consultation/HPI:  HPI: [name] is a 53 y.o. female with medical history significant of DM; OSA not on CPAP; RLS; PVD; HTN; HLD; and ESRD on MWF HD.  She had prior hospitalization from 8/17-19 with acute metabolic encephalopathy thought to be secondary to narcotics. She had a prior + COVID test on 8/13 and denied symptoms.  She has a LUE AVF and developed a paronychia of the left thumb in July treated with drainage and Augmentin.  MRI showed osteo and there was concern for steal syndrome with L thumb gangrene.  She was planned to have fistula ligation and HD catheter insertion on 8/17 but instead was admitted as above.  She returned today for the procedure.  Procedure was performed without apparent complication.  However, post-procedure she wouldn't wake up well and O2 sats were 79-82%.  She got 2 rounds of Narcan, 99% on 5L. Falls back asleep too quickly after narcan.  She was admitted to vascular service for overnight observation.  Dr. Oneida Alar requests a consult.  At the time of my evaluation, the patient was quite somnolent.  She would reluctantly open eyes to voice/touch.  She had mildly increased WOB but was in NAD with good O2 sats on [place] O2.  Se denies pain, fever, SOB, cough at this time.   Review of Systems:  ROS As per HPI otherwise 10 point review of systems negative.    Past Medical History: Past Medical History:  Diagnosis Date  . Anemia   . Arthritis    "knees"  . CAD (coronary artery disease)    Nonobstructive on CT 2019  . ESRD (end stage renal disease) ([place])    Dialysis M/W/F  . Headache(784.0)   . High cholesterol   . Hypertension   . 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  Pericardial effusion   . Pneumonia    x 2   . PVD (peripheral vascular disease) ([place])    Right leg stent in [place].  (No records)  . Restless legs   . Sleep apnea    does not use Cpap  . Type II diabetes mellitus ([place])     Past Surgical History: Past Surgical History:  Procedure Laterality Date  . AV FISTULA PLACEMENT Left   . [place]; 1997   x 2  . COLONOSCOPY    . EYE SURGERY Bilateral    cataract surgery  . PERICARDIAL WINDOW  02/2004   for pericardial effusion  . TUBAL LIGATION  05/1995     Allergies:  No Known Allergies   Social History:  reports that she has been smoking cigarettes. She has a 10.00 pack-year smoking history. She has never used smokeless tobacco. She reports that she does not drink alcohol or use drugs.   Family History: Family History  Problem Relation Age of Onset  . Cancer Brother   . Heart disease Father        Died age 25  . Diabetes Father   . Hyperlipidemia Father   . Hypertension Father   . Stroke Father   . Diabetes Mother   . Hypertension Mother   . Stroke  Mother   . Dementia Mother   . Diabetes Sister   . Diabetes Brother   . Hypertension Sister   . Hypertension Brother       Physical Exam: Vitals:   09/26/18 1500 09/26/18 1518 09/26/18 1519 09/26/18 1600  BP:    139/67  Pulse:    63  Resp:    18  Temp:      TempSrc:      SpO2: 93% (!) 87% 94% 99%  Weight:      Height:        Constitutional: Somnolent, awakens to voice/touch, not in any acute distress. Eyes: EOMI, irises appear normal, anicteric sclera,  ENMT: external ears and nose appear normal, normal hearing, Lips appear normal Neck: neck appears normal, no masses CVS: S1-S2 clear, no murmur rubs or gallops, no LE edema, normal pedal pulses  Respiratory:  clear to auscultation bilaterally, no wheezing, rales or rhonchi. Respiratory effort mildly increased. No accessory muscle use.  Abdomen: soft nontender, nondistended Musculoskeletal: :  no cyanosis, clubbing or edema noted bilaterally; L thumb is bandaged Neuro: unable to perform Psych: arouses to voice/touch but reluctantly Skin: no rashes or lesions or ulcers, no induration or nodules; chronic R > LLE vitiligo    Data reviewed:  I have personally reviewed the recent labs and imaging studies  Pertinent Labs:   Glucose 148, 124, 183 Hgb 7.3; 7.2 on 8/19 COVID POSITIVE on 8/13   Inpatient Medications:   Scheduled Meds: . atorvastatin  80 mg Oral q1800  . ferric citrate  630 mg Oral TID WC  . gabapentin  100 mg Oral Daily  . hydrOXYzine  25 mg Oral Daily  . insulin aspart  0-9 Units Subcutaneous TID WC  . insulin aspart protamine- aspart  30 Units Subcutaneous BID WC  . metoprolol succinate  25 mg Oral Daily  . [START ON 09/27/2018] pantoprazole  40 mg Oral Daily  . potassium chloride  20-40 mEq Oral Once  . pramipexole  0.5 mg Oral Daily  . sodium chloride flush  3 mL Intravenous Q12H  . [START ON 09/27/2018] zinc sulfate  220 mg Oral Daily   Continuous Infusions: . sodium chloride       Radiological Exams on Admission: Dg Chest Port 1 View  Result Date: 09/26/2018 CLINICAL DATA:  Dialysis catheter placement EXAM: PORTABLE CHEST 1 VIEW COMPARISON:  September 18, 2018 FINDINGS: Central catheter tip is in the right atrium. No pneumothorax. There is cardiomegaly with pulmonary venous hypertension. There is patchy airspace opacity in both lower lobes with small pleural effusions bilaterally. There is slight interstitial edema. No adenopathy. No bone lesions. IMPRESSION: Central catheter tip in right atrium. No pneumothorax. There is pulmonary vascular congestion with mild interstitial edema. Small pleural effusions noted. Airspace opacity in the bases is likely due to alveolar edema, although there may be superimposed pneumonia in the bases, particularly on the left. Note that both pneumonia and alveolar edema may present concurrently. Electronically Signed   By: Lowella Grip III M.D.   On: 09/26/2018 14:48   Dg Fluoro Guide Cv Line-no Report  Result Date: 09/26/2018 Fluoroscopy was utilized by the requesting physician.  No radiographic interpretation.    Impression/Recommendations Principal Problem:   Acute respiratory failure with hypoxia (HCC) Active Problems:   Hypertension   Type II diabetes mellitus with complication, uncontrolled (HCC)   Hyperlipidemia, mixed   ESRD on dialysis ([place])   Anemia   COVID-19 virus infection   Gangrene ([place])  Chronic pain disorder  Acute respiratory failure with hypoxia -Patient presenting for AV fistula ligation due to steal syndrome -Post-operatively with persistent obtundation and hypoxia -Likely associated with anesthesia, possibly complicated by recent [id number] infection (see below) -Has responded to 2 doses of Narcan -At the time of my evaluation, she would arouse to stimulation but was quite somnolent; O2 sats were stable on [place] O2 and she had improved respiratory effort compared to reported description prior -Will observe overnight, anticipate improvement once anesthesia wears off -prn Narcan ordered but she does not appear to need a drip at this time  COVID-19 infection -Recently diagnosed with minimally symptomatic COVID-19 infection -She may have had mild cough or SOB prior to today's presentation, but again symptoms appear to have been mild if at all present -COVID labs ordered to assess level of inflammatory process at this time -She will remain on 2W in isolation  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 Joelyn Oms notified of admission, as patient will need HD tomorrow - she is on 2nd shift HD and so likely can go to her HD center for dialysis after d/c if all goes well  Chronic  pain -I have reviewed this patient in the  Controlled Substances Reporting System.  She is receiving medications from only one provider and appears to be taking them as prescribed. -This issue may have made her more susceptible to prolonged effects of anesthesia. -Hold home Oxycodone for now given excessive somnolence; will write to resume in AM assuming her mental status clears  HTN -Continue Toprol XL  HLD -Continue Lipitor  DM -Changed to 70/30 while inpatient (instead of home 75/25) .Will cover with sensitive-scale SSI    Thank you for this consultation.  Our [facility] hospitalist team will follow the patient with you.   Time Spent: 32 minutes  Karmen Bongo M.D. [facility] 09/26/2018, 4:48 PM

## 2018-09-27 ENCOUNTER — Inpatient Hospital Stay (HOSPITAL_COMMUNITY): Payer: Medicare Other

## 2018-09-27 ENCOUNTER — Telehealth: Payer: Self-pay | Admitting: *Deleted

## 2018-09-27 ENCOUNTER — Encounter (HOSPITAL_COMMUNITY): Payer: Self-pay | Admitting: Vascular Surgery

## 2018-09-27 DIAGNOSIS — U071 COVID-19: Secondary | ICD-10-CM | POA: Diagnosis not present

## 2018-09-27 DIAGNOSIS — E1165 Type 2 diabetes mellitus with hyperglycemia: Secondary | ICD-10-CM

## 2018-09-27 DIAGNOSIS — Z992 Dependence on renal dialysis: Secondary | ICD-10-CM | POA: Diagnosis present

## 2018-09-27 DIAGNOSIS — G894 Chronic pain syndrome: Secondary | ICD-10-CM | POA: Diagnosis not present

## 2018-09-27 DIAGNOSIS — I96 Gangrene, not elsewhere classified: Secondary | ICD-10-CM

## 2018-09-27 DIAGNOSIS — I10 Essential (primary) hypertension: Secondary | ICD-10-CM

## 2018-09-27 DIAGNOSIS — T82898A Other specified complication of vascular prosthetic devices, implants and grafts, initial encounter: Secondary | ICD-10-CM | POA: Diagnosis not present

## 2018-09-27 DIAGNOSIS — J9601 Acute respiratory failure with hypoxia: Secondary | ICD-10-CM | POA: Diagnosis not present

## 2018-09-27 DIAGNOSIS — E782 Mixed hyperlipidemia: Secondary | ICD-10-CM

## 2018-09-27 DIAGNOSIS — E118 Type 2 diabetes mellitus with unspecified complications: Secondary | ICD-10-CM

## 2018-09-27 DIAGNOSIS — D631 Anemia in chronic kidney disease: Secondary | ICD-10-CM

## 2018-09-27 DIAGNOSIS — N186 End stage renal disease: Secondary | ICD-10-CM | POA: Diagnosis present

## 2018-09-27 LAB — GLUCOSE, CAPILLARY
Glucose-Capillary: 359 mg/dL — ABNORMAL HIGH (ref 70–99)
Glucose-Capillary: 283 mg/dL — ABNORMAL HIGH (ref 70–99)

## 2018-09-27 LAB — CBC WITH DIFFERENTIAL/PLATELET
Abs Immature Granulocytes: 0.08 10*3/uL — ABNORMAL HIGH (ref 0.00–0.07)
Basophils Absolute: 0 10*3/uL (ref 0.0–0.1)
Basophils Relative: 0 %
Eosinophils Absolute: 0 10*3/uL (ref 0.0–0.5)
Eosinophils Relative: 0 %
HCT: 22.9 % — ABNORMAL LOW (ref 36.0–46.0)
Hemoglobin: 7.2 g/dL — ABNORMAL LOW (ref 12.0–15.0)
Immature Granulocytes: 1 %
Lymphocytes Relative: 7 %
Lymphs Abs: 0.5 10*3/uL — ABNORMAL LOW (ref 0.7–4.0)
MCH: 31.7 pg (ref 26.0–34.0)
MCHC: 31.4 g/dL (ref 30.0–36.0)
MCV: 100.9 fL — ABNORMAL HIGH (ref 80.0–100.0)
Monocytes Absolute: 0.2 10*3/uL (ref 0.1–1.0)
Monocytes Relative: 3 %
Neutro Abs: 5.7 10*3/uL (ref 1.7–7.7)
Neutrophils Relative %: 89 %
Platelets: 201 10*3/uL (ref 150–400)
RBC: 2.27 MIL/uL — ABNORMAL LOW (ref 3.87–5.11)
RDW: 16.2 % — ABNORMAL HIGH (ref 11.5–15.5)
WBC: 6.4 10*3/uL (ref 4.0–10.5)
nRBC: 0 % (ref 0.0–0.2)

## 2018-09-27 LAB — HIV ANTIBODY (ROUTINE TESTING W REFLEX): HIV Screen 4th Generation wRfx: NONREACTIVE

## 2018-09-27 LAB — COMPREHENSIVE METABOLIC PANEL
ALT: 13 U/L (ref 0–44)
AST: 19 U/L (ref 15–41)
Albumin: 2.8 g/dL — ABNORMAL LOW (ref 3.5–5.0)
Alkaline Phosphatase: 71 U/L (ref 38–126)
Anion gap: 16 — ABNORMAL HIGH (ref 5–15)
BUN: 36 mg/dL — ABNORMAL HIGH (ref 6–20)
CO2: 28 mmol/L (ref 22–32)
Calcium: 8.6 mg/dL — ABNORMAL LOW (ref 8.9–10.3)
Chloride: 89 mmol/L — ABNORMAL LOW (ref 98–111)
Creatinine, Ser: 6.16 mg/dL — ABNORMAL HIGH (ref 0.44–1.00)
GFR calc Af Amer: 8 mL/min — ABNORMAL LOW (ref >60)
GFR calc non Af Amer: 7 mL/min — ABNORMAL LOW (ref >60)
Glucose, Bld: 376 mg/dL — ABNORMAL HIGH (ref 70–99)
Potassium: 4.9 mmol/L (ref 3.5–5.1)
Sodium: 133 mmol/L — ABNORMAL LOW (ref 135–145)
Total Bilirubin: 0.6 mg/dL (ref 0.3–1.2)
Total Protein: 7.9 g/dL (ref 6.5–8.1)

## 2018-09-27 LAB — D-DIMER, QUANTITATIVE: D-Dimer, Quant: 3.55 ug/mL-FEU — ABNORMAL HIGH (ref 0.00–0.50)

## 2018-09-27 LAB — LACTATE DEHYDROGENASE: LDH: 197 U/L — ABNORMAL HIGH (ref 98–192)

## 2018-09-27 LAB — FIBRINOGEN: Fibrinogen: 686 mg/dL — ABNORMAL HIGH (ref 210–475)

## 2018-09-27 MED ORDER — CHLORHEXIDINE GLUCONATE CLOTH 2 % EX PADS: 6.0000 | MEDICATED_PAD | Freq: Every day | CUTANEOUS | Status: DC

## 2018-09-27 MED ORDER — GUAIFENESIN ER 600 MG PO TB12: 1200.0000 mg | ORAL_TABLET | Freq: Two times a day (BID) | ORAL | 0 refills | Status: DC

## 2018-09-27 MED ORDER — BENZONATATE 100 MG PO CAPS: 200.0000 mg | ORAL_CAPSULE | Freq: Three times a day (TID) | ORAL | Status: DC | PRN

## 2018-09-27 MED ORDER — BENZONATATE 200 MG PO CAPS: 200.0000 mg | ORAL_CAPSULE | Freq: Three times a day (TID) | ORAL | 0 refills | Status: DC | PRN

## 2018-09-27 MED ORDER — GUAIFENESIN ER 600 MG PO TB12: 1200.0000 mg | ORAL_TABLET | Freq: Two times a day (BID) | ORAL | Status: DC

## 2018-09-27 NOTE — Progress Notes (Signed)
SATURATION QUALIFICATIONS: (This note is used to comply with regulatory documentation for home oxygen)  Patient Saturations on Room Air at Rest = 93%  Patient Saturations on Room Air while Ambulating = 92%  Patient Saturations on 0 Liters of oxygen while Ambulating = 92%  Please briefly explain why patient needs home oxygen: Patient oxygen dropped as low as 92% while ambulating from bed to door on room air. Post ambulation, oxygen dropped as low as 90 on room air.

## 2018-09-27 NOTE — Progress Notes (Signed)
Renal Navigator has placed calls to bedside RN, but she has not been available at these times. Renal Navigator still does not see d/c order. If patient is not discharged today, she will need to have HD in the hospital as today is her dialysis day. Renal Navigator attempted to reach RN again, but was not able to. Navigator spoke to [employer] to discuss. Charge RN took number to return call to Renal Navigator.  Alphonzo Cruise, [place] Renal Navigator [phone]

## 2018-09-27 NOTE — Progress Notes (Signed)
PROGRESS NOTE    [name]  GGY:[id number] DOB: 31-Mar-1965 DOA: 09/26/2018 PCP: Sandi Mariscal, MD   Brief Narrative: Consultation HPI per Dr. Karmen Bongo on 09/26/2018 [name] is a 53 y.o. female with medical history significant of DM; OSA not on CPAP; RLS; PVD; HTN; HLD; and ESRD on MWF HD.  She had prior hospitalization from 8/17-19 with acute metabolic encephalopathy thought to be secondary to narcotics. She had a prior + COVID test on 8/13 and denied symptoms.  She has a LUE AVF and developed a paronychia of the left thumb in July treated with drainage and Augmentin.  MRI showed osteo and there was concern for steal syndrome with L thumb gangrene.  She was planned to have fistula ligation and HD catheter insertion on 8/17 but instead was admitted as above.  She returned today for the procedure.  Procedure was performed without apparent complication.  However, post-procedure she wouldn't wake up well and O2 sats were 79-82%.  She got 2 rounds of Narcan, 99% on 5L. Falls back asleep too quickly after narcan.  She was admitted to vascular service for overnight observation.  Dr. Oneida Alar requests a consult.  At the time of my evaluation, the patient was quite somnolent.  She would reluctantly open eyes to voice/touch.  She had mildly increased WOB but was in NAD with good O2 sats on [place] O2.  Se denies pain, fever, SOB, cough at this time  **Interim History She is more awake and alert today and wanting to go home.  Still wearing supplemental oxygen via nasal cannula on 4 L morning but was able to be weaned and is doing well without it.  She ambulated without desaturating.  Denies any subjective shortness of breath well.  No lightheadedness or dizziness.  No chest pain her main complaint is a cough.  We will continue benzonatate and guaifenesin.  She is started on IV steroids last night but because she is no longer hypoxic and minimally symptomatic we will discontinue the steroids and she can  follow-up with PCP.  She is planning on being discharged by primary team I recommend following up with pulmonary in the outpatient setting within 1 to 2 weeks.  Assessment & Plan:   Principal Problem:   Acute respiratory failure with hypoxia (HCC) Active Problems:   Hypertension   Type II diabetes mellitus with complication, uncontrolled (HCC)   Hyperlipidemia, mixed   ESRD on dialysis ([place])   Anemia   COVID-19 virus infection   Gangrene (HCC)   Chronic pain disorder   ESRD (end stage renal disease) ([place])  Acute Respiratory Failure with Hypoxia, improved -Patient presenting for AV fistula ligation due to steal syndrome -Post-operatively with persistent obtundation and hypoxia -Likely associated with anesthesia, possibly complicated by recent [id number] infection (see below) -Has responded to 2 doses of Narcan and will continue as necessary -At the time of Dr. Lorin Mercy' evaluation, she would arouse to stimulation but was quite somnolent; O2 sats were stable on [place] O2 and she had improved respiratory effort compared to reported description prior; now she is much more awake and alert and remains on oxygen but is doing significantly better. -Observed overnight, anticipate improvement once anesthesia wears off; Weaned off of O2 this Afternoon  -prn Narcan ordered but she does not appear to need a drip at this time -We will need an ambulatory home O2 screen prior to discharge and she did not desaturate at all -Repeat CXR this AM showed "Stable chest radiograph findings. Persistent densities  in the right lower chest could represent asymmetric edema versus subtle airspace disease. Stable cardiomegaly.  Dialysis catheter is stable with the tip in the right atrium." -Continue supplemental oxygen via nasal cannula and wean O2 as tolerated; now off oxygen and doing well -Continue with guaifenesin-dextromethorphan 15 mils every 4 PRN for cough and will add benzonatate at discharge -Maintain O2 saturations  greater than 90% and continue supplemental oxygen via nasal cannula if needed -Follow-up with vascular surgery and pulmonology in outpatient setting  COVID-19 Infection -Recently diagnosed on 09/14/2018 with minimally symptomatic COVID-19 infection -She may have had mild cough or SOB prior to today's presentation, but again symptoms appear to have been mild if at all present -COVID labs ordered to assess level of inflammatory process at this time;  -LDH is improving and went from 233 is now 197, ferritin level was 1593, CRP is 10.5, procalcitonin level is 0.29, D-dimer is essentially stable and went from 3.46, and is now 3.55 and fibrinogen is improving from 706 -> 686 -She will remain on 2W in isolation for now and will wean O2 as tolerated; she was weaned off of oxygen and is doing fairly well -She was started on Solu-Medrol 50 mg every 12 and this can be discontinued at discharge given her improvement in her respiratory status -CXR as above -Continue to Monitor and Trend Inflammatory Markers and follow-up as an outpatient  Gangrene -L thumb ischemia associated with steal syndrome -Taken to the OR yesterday for AV fistula ligation and HD catheter placement -Outpatient f/u with hand surgery for gangrenous stump  ESRD on HD, with Anemia of chronic kidney disease -Chronic MWF HD; if discharged today can go to dialysis at 5 PM; BUN/creatinine is 36/6.16 -Anemia appears to be stable; continue ferric citrate 630 mg p.o. 3 times daily with meals -Dr. Joelyn Oms notified of admission, as patient will need HD tomorrow - she is on 2nd shift HD and so likely can go to her HD center for dialysis after d/c if all goes well -If she is not DC'd today she will need in-house dialysis however if she can go to COVID unit dialysis later today  Chronic Pain -I have reviewed this patient in the [place] Controlled Substances Reporting System.  She is receiving medications from only one provider and appears to be  taking them as prescribed. -This issue may have made her more susceptible to prolonged effects of anesthesia. -Held Oxycodone for now given excessive somnolence; oxycodone 15 mg every 6 PRN for moderate pain resumed this morning given her improvement in her mental status  HTN -Continue Toprol XL 25 mg p.o. daily -Continue with Lopressor 2 5 mg IV every 2 as needed for heart rate sustained greater than 130  HLD -Continue Atorvastatin 80 mg po Daily   DM -Changed to 70/30 while inpatient (instead of home 75/25) and is now on 30 units twice daily -Will cover with sensitive-scale SSI -Continue monitor and trend CBGs closely -CBG's ranging from 201-359 -Doing home insulin at discharge  Obesity -Estimated body mass index is 35.16 kg/m as calculated from the following:   Height as of this encounter: 5\' 9"  (1.753 m).   Weight as of this encounter: 108 kg. -Weight Loss and Dietary Counseling given   DVT prophylaxis: SCDs Code Status: FULL CODE  Family Communication: No Family Present at bedside  Disposition Plan: Per Primary Team; Likely Home today.   Consultants:   [facility]  Vascular Surgery (Primary)  Discussed with nephrology Dr. Joelyn Oms   Procedures:  Ultrasound-guided insertion of palindrome catheter and ligation of left arm AV fistula done by Dr. Juanda Crumble fields on 09/26/2018   Antimicrobials:  Anti-infectives (From admission, onward)   Start     Dose/Rate Route Frequency Ordered Stop   09/26/18 0727  ceFAZolin (ANCEF) IVPB 2g/100 mL premix     2 g 200 mL/hr over 30 Minutes Intravenous 30 min pre-op 09/26/18 9449 09/26/18 1056     Subjective: Seen and examined at bedside and states that she is feeling well.  States that she had a cough but was tolerable.  No chest pain, lightheadedness or dizziness.  Denies any shortness of breath.  Was wearing oxygen this morning when I saw her but it was removed off oxygen and doing well and ambulated without issues.  Can be discharged  and follow-up with PCP, pulmonary as well as vascular and hand surgeon outpatient setting.  Inflammatory markers are a little elevated but some are trending down.  Will need to follow-up with PCP within 1 week to repeat.  Objective: Vitals:   09/26/18 2305 09/27/18 0302 09/27/18 0730 09/27/18 1120  BP: (!) 146/88 (!) 154/78 (!) 154/83 (!) 154/68  Pulse: 67  80 83  Resp:   20 15  Temp: 97.6 F (36.4 C) 98.1 F (36.7 C) (!) 97 F (36.1 C) (!) 97 F (36.1 C)  TempSrc: Oral Oral Oral Axillary  SpO2:  95% 93% 100%  Weight:      Height:        Intake/Output Summary (Last 24 hours) at 09/27/2018 1402 Last data filed at 09/27/2018 0600 Gross per 24 hour  Intake 770 ml  Output -  Net 770 ml   Filed Weights   09/25/18 0810 09/26/18 0736 09/26/18 1459  Weight: 111.1 kg 111.1 kg 108 kg   Examination: Physical Exam:  Constitutional: WN/WD obese AAF, NAD and appears calm and comfortable Eyes: PERRL, lids and conjunctivae normal, sclerae anicteric  ENMT: External Ears, Nose appear normal. Grossly normal hearing. Mucous membranes are moist. Neck: Appears normal, supple, no cervical masses, normal ROM, no appreciable thyromegaly; no JVD Respiratory: Diminished to auscultation bilaterally, no wheezing, rales, rhonchi or crackles. Normal respiratory effort and patient is not tachypenic. No accessory muscle use. Was wearing supplemental O2 this AM but weaned and tolerated well  Cardiovascular: RRR, no murmurs / rubs / gallops. S1 and S2 auscultated. Trace Extremity edema Abdomen: Soft, non-tender, Distended due to body habitys. No masses palpated. No appreciable hepatosplenomegaly. Bowel sounds positive x4.  GU: Deferred. Musculoskeletal: No clubbing / cyanosis of digits/nails. No joint deformity upper and lower extremities. Skin: No rashes, lesions, ulcers on a limited skin evaluation. No induration; Warm and dry.  Neurologic: CN 2-12 grossly intact with no focal deficits. Romberg sign and  cerebellar reflexes not assessed.  Psychiatric: Normal judgment and insight. Alert and oriented x 3. Anxious mood and appropriate affect.   Data Reviewed: I have personally reviewed following labs and imaging studies  CBC: Recent Labs  Lab 09/26/18 0758 09/26/18 1533 09/27/18 0527  WBC  --  8.4 6.4  NEUTROABS  --   --  5.7  HGB 8.8* 7.3* 7.2*  HCT 26.0* 23.8* 22.9*  MCV  --  102.1* 100.9*  PLT  --  196 675   Basic Metabolic Panel: Recent Labs  Lab 09/26/18 0758 09/26/18 1533 09/26/18 1617 09/27/18 0527  NA 134*  --  136 133*  K 3.8  --  4.2 4.9  CL  --   --  93* 89*  CO2  --   --  28 28  GLUCOSE 159*  --  182* 376*  BUN  --   --  19 36*  CREATININE  --  4.98* 4.99* 6.16*  CALCIUM  --   --  8.4* 8.6*   GFR: Estimated Creatinine Clearance: 13.7 mL/min (A) (by C-G formula based on SCr of 6.16 mg/dL (H)). Liver Function Tests: Recent Labs  Lab 09/26/18 1617 09/27/18 0527  AST 20 19  ALT 16 13  ALKPHOS 63 71  BILITOT 0.8 0.6  PROT 7.5 7.9  ALBUMIN 2.8* 2.8*   No results for input(s): LIPASE, AMYLASE in the last 168 hours. No results for input(s): AMMONIA in the last 168 hours. Coagulation Profile: No results for input(s): INR, PROTIME in the last 168 hours. Cardiac Enzymes: No results for input(s): CKTOTAL, CKMB, CKMBINDEX, TROPONINI in the last 168 hours. BNP (last 3 results) No results for input(s): PROBNP in the last 8760 hours. HbA1C: No results for input(s): HGBA1C in the last 72 hours. CBG: Recent Labs  Lab 09/26/18 0932 09/26/18 1533 09/26/18 1951 09/27/18 0729 09/27/18 1106  GLUCAP 124* 183* 201* 359* 283*   Lipid Profile: No results for input(s): CHOL, HDL, LDLCALC, TRIG, CHOLHDL, LDLDIRECT in the last 72 hours. Thyroid Function Tests: No results for input(s): TSH, T4TOTAL, FREET4, T3FREE, THYROIDAB in the last 72 hours. Anemia Panel: Recent Labs    09/26/18 2200  FERRITIN 1,593*   Sepsis Labs: Recent Labs  Lab 09/26/18 1617   PROCALCITON 0.29    Recent Results (from the past 240 hour(s))  Culture, blood (routine x 2)     Status: None   Collection Time: 09/18/18 12:20 PM   Specimen: BLOOD  Result Value Ref Range Status   Specimen Description BLOOD RIGHT ANTECUBITAL  Final   Special Requests   Final    BOTTLES DRAWN AEROBIC ONLY Blood Culture adequate volume   Culture   Final    NO GROWTH 5 DAYS Performed at [facility], [address] [place], [place] [address]    Report Status 09/23/2018 FINAL  Final  MRSA PCR Screening     Status: None   Collection Time: 09/18/18 12:20 PM   Specimen: Nasopharyngeal  Result Value Ref Range Status   MRSA by PCR NEGATIVE NEGATIVE Final    Comment:        The GeneXpert MRSA Assay (FDA approved for NASAL specimens only), is one component of a comprehensive MRSA colonization surveillance program. It is not intended to diagnose MRSA infection nor to guide or monitor treatment for MRSA infections. Performed at [facility], [place] [address] [place], [place] [address]   Culture, blood (routine x 2)     Status: None   Collection Time: 09/18/18 12:25 PM   Specimen: BLOOD RIGHT WRIST  Result Value Ref Range Status   Specimen Description BLOOD RIGHT WRIST  Final   Special Requests   Final    BOTTLES DRAWN AEROBIC ONLY Blood Culture adequate volume   Culture   Final    NO GROWTH 5 DAYS Performed at [facility], [address], [place], [place] [address]    Report Status 09/23/2018 FINAL  Final    Radiology Studies: Dg Chest Port 1 View  Result Date: 09/27/2018 CLINICAL DATA:  COVID-19.  Cough. EXAM: PORTABLE CHEST 1 VIEW COMPARISON:  09/26/2018 FINDINGS: Stable position of the right jugular dialysis catheter with the tip in the upper right atrium. Heart size remains enlarged. Upper  lungs are clear. Hazy densities in the right lower lung are again noted. Slightly prominent central vascular structures are unchanged. IMPRESSION: 1. Stable chest  radiograph findings. Persistent densities in the right lower chest could represent asymmetric edema versus subtle airspace disease. 2. Stable cardiomegaly. 3. Dialysis catheter is stable with the tip in the right atrium. Electronically Signed   By: Markus Daft M.D.   On: 09/27/2018 08:52   Dg Chest Port 1 View  Result Date: 09/26/2018 CLINICAL DATA:  Dialysis catheter placement EXAM: PORTABLE CHEST 1 VIEW COMPARISON:  September 18, 2018 FINDINGS: Central catheter tip is in the right atrium. No pneumothorax. There is cardiomegaly with pulmonary venous hypertension. There is patchy airspace opacity in both lower lobes with small pleural effusions bilaterally. There is slight interstitial edema. No adenopathy. No bone lesions. IMPRESSION: Central catheter tip in right atrium. No pneumothorax. There is pulmonary vascular congestion with mild interstitial edema. Small pleural effusions noted. Airspace opacity in the bases is likely due to alveolar edema, although there may be superimposed pneumonia in the bases, particularly on the left. Note that both pneumonia and alveolar edema may present concurrently. Electronically Signed   By: Lowella Grip III M.D.   On: 09/26/2018 14:48   Dg Fluoro Guide Cv Line-no Report  Result Date: 09/26/2018 Fluoroscopy was utilized by the requesting physician.  No radiographic interpretation.    Scheduled Meds: . atorvastatin  80 mg Oral q1800  . ferric citrate  630 mg Oral TID WC  . gabapentin  100 mg Oral Daily  . hydrOXYzine  25 mg Oral Daily  . insulin aspart  0-9 Units Subcutaneous TID WC  . insulin aspart protamine- aspart  30 Units Subcutaneous BID WC  . methylPREDNISolone (SOLU-MEDROL) injection  50 mg Intravenous Q12H  . metoprolol succinate  25 mg Oral Daily  . pantoprazole  40 mg Oral Daily  . potassium chloride  20-40 mEq Oral Once  . pramipexole  0.5 mg Oral Daily  . sodium chloride flush  3 mL Intravenous Q12H  . zinc sulfate  220 mg Oral Daily    Continuous Infusions: . sodium chloride      LOS: 1 day   Kerney Elbe, DO [facility] PAGER is on AMION  If 7PM-7AM, please contact night-coverage [email or web address] Password [email or web address] 09/27/2018, 2:02 PM

## 2018-09-27 NOTE — Telephone Encounter (Signed)
-----   Message from Elam Dutch, MD sent at 09/27/2018  8:22 AM EDT ----- Pt needs appt with hand surgery for gangrene of left thumb. She missed recent appt on 24th bc she was in the hospital  She can follow up in APP clinic in 4-6 weeks or whatever protocol we have in place for post COVID eval time.  She does not need to be seen while active infection.    She had a positive test about 2 weeks ago.  Ruta Hinds

## 2018-09-27 NOTE — Progress Notes (Signed)
[facility]  Subjective  - feels better wants to go home breathing ok   Objective (!) 154/83 80 (!) 97 F (36.1 C) (Oral) 20 93%  Intake/Output Summary (Last 24 hours) at 09/27/2018 0820 Last data filed at 09/27/2018 0600 Gross per 24 hour  Intake 1370 ml  Output 20 ml  Net 1350 ml   Left arm incision clean no flow in fistula  Assessment/Planning: S/p ligation AVF for steal Admitted for decreased sats with dx COVID subjectively better today Ok for d/c home from my standpoint Will arrange follow up with hand surgeon for gangrenous thumb  Ruta Hinds 09/27/2018 8:20 AM --  Laboratory Lab Results: Recent Labs    09/26/18 0758 09/26/18 1533  WBC  --  8.4  HGB 8.8* 7.3*  HCT 26.0* 23.8*  PLT  --  196   BMET Recent Labs    09/26/18 1617 09/27/18 0527  NA 136 133*  K 4.2 4.9  CL 93* 89*  CO2 28 28  GLUCOSE 182* 376*  BUN 19 36*  CREATININE 4.99* 6.16*  CALCIUM 8.4* 8.6*    COAG Lab Results  Component Value Date   INR 0.90 01/30/2011   No results found for: PTT

## 2018-09-27 NOTE — Progress Notes (Signed)
Patient receives OP HD at [facility] isolation 3rd shift MWF due to COVID + status. If medically cleared for discharge today, she can return to [facility] for her next HD treatment. She will not have HD in the hospital prior to discharge. Renal Navigator has dicussed this with Renal PA/K. Stovall and sent message to Attending.   Alphonzo Cruise, [place] Renal Navigator [phone]

## 2018-09-27 NOTE — Progress Notes (Signed)
Renal Navigator spoke with RN who states patient is ready for discharge. Renal Navigator explained that patient needs to go to OP HD at [facility] with 5:45pm arrival. Renal Navigator spoke with patient who states she transports to COVID shift by [facility]. She reports no other means of transportation. Renal Navigator arranged for [facility] pick up from the hospital at 5:00pm and asked CSW/L. Paisley to complete Medical Necessity form for [facility]. Navigator informed Financial controller as RN was in with patient when Navigator called back. Renal Navigator notified OP HD clinic that patient will be back to clinic tonight for treatment. Renal Navigator called patient in room who states that her cousin is picking her up from the hospital (though she had already stated that she had no other transportation options) and asked for [facility] to pick her up at her home and take her to OP HD clinic. Renal Navigator called [facility] and changed pick up from hospital to patient's home. [facility] will provide transportation from clinic back to patient's home after HD tonight. Renal Navigator notified CSW of change in pick up plan.   [name], [place] Renal Navigator [phone]

## 2018-09-28 NOTE — Discharge Summary (Signed)
[facility] Discharge Summary   Patient ID:  [name] MRN: [id number] DOB/AGE: November 22, 1965 53 y.o.  Admit date: 09/26/2018 Discharge date: 09/27/2018 Date of Surgery: 09/26/2018 Surgeon: Surgeon(s): Fields, Jessy Oto, MD  Admission Diagnosis: ESRD (end stage renal disease) [facility]) [N18.6]  Discharge Diagnoses:  ESRD (end stage renal disease) ([place]) [N18.6]  Secondary Diagnoses: Past Medical History:  Diagnosis Date  . Anemia   . Arthritis    "knees"  . CAD (coronary artery disease)    Nonobstructive on CT 2019  . ESRD (end stage renal disease) ([place])    Dialysis M/W/F  . Headache(784.0)   . High cholesterol   . Hypertension   . 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 Pericardial effusion   . Pneumonia    x 2   . PVD (peripheral vascular disease) ([place])    Right leg stent in [place].  (No records)  . Restless legs   . Sleep apnea    does not use Cpap  . Type II diabetes mellitus (HCC)     Procedure(s): LIGATION OF ARTERIOVENOUS  FISTULA LEFT ARM INSERTION OF DIALYSIS CATHETER Right Internal Jugular.  Discharged Condition: stable  HPI: [name] is a 53 y.o. female, with a several month history of slowly worsening wound in her left thumb.  She states over the last couple of weeks it got much worse.  She is currently followed at the wound center.  She has an existing fistula in her left upper arm.  She has never had any other prior access procedures.  Her dialysis today is Tuesday Thursday Saturday.  Her nephrologist is Dr. Posey Pronto.  She states the wound has progressively enlarged over time.  There is some pain in the hand.  She does not know how the wound started.  Other medical problems include diabetes, coronary artery disease, elevated cholesterol, hypertension all of which have been stable.  She is currently on Augmentin.    Gangrenous left first finger.  This is most likely secondary to steal.  She was scheduled  for ligation of the fistula and placement of TDC for HD, and referral to hand surgeon.  Pre-op nasal swab was positive for COVID.    Hospital Course:  [name] is a 53 y.o. female is S/P  Procedure(s): LIGATION OF ARTERIOVENOUS  FISTULA LEFT ARM INSERTION OF DIALYSIS CATHETER Right Internal Jugular.  Consults:  Treatment Team:  Kelvin Cellar, MD Rexene Agent, MD   In PACU she was showing O2 SAT of 79-82 and placed on 3 L of [place] O2.  She was admitted for observation to 2W Covid floor of [place].  TRH MD has been consulted and will come see the patient as well.  - Monitor Neuro Status   Patient was given Narcan due to LOC.  She had normal BS.  She was then alert to stimuli.  She was observed over night.  POD#2 she was weaned off O2 SAT on RA with activity 92% A & O x 3.  She was discharged home in stable condition.  Significant Diagnostic Studies: CBC Lab Results  Component Value Date   WBC 6.4 09/27/2018   HGB 7.2 (L) 09/27/2018   HCT 22.9 (L) 09/27/2018   MCV 100.9 (H) 09/27/2018   PLT 201 09/27/2018    BMET    Component Value Date/Time   NA 133 (L) 09/27/2018 0527   NA 135 06/01/2017 0959   K 4.9 09/27/2018 0527  CL 89 (L) 09/27/2018 0527   CO2 28 09/27/2018 0527   GLUCOSE 376 (H) 09/27/2018 0527   BUN 36 (H) 09/27/2018 0527   BUN 27 (H) 06/01/2017 0959   CREATININE 6.16 (H) 09/27/2018 0527   CREATININE 1.96 (H) 07/19/2011 1201   CALCIUM 8.6 (L) 09/27/2018 0527   GFRNONAA 7 (L) 09/27/2018 0527   GFRAA 8 (L) 09/27/2018 0527   COAG Lab Results  Component Value Date   INR 0.90 01/30/2011     Disposition:  Discharge to :Home Discharge Instructions    Call MD for:  redness, tenderness, or signs of infection (pain, swelling, bleeding, redness, odor or green/yellow discharge around incision site)   Complete by: As directed    Call MD for:  severe or increased pain, loss or decreased feeling  in affected limb(s)   Complete by: As directed    Call MD  for:  temperature >100.5   Complete by: As directed    Discharge instructions   Complete by: As directed    Wash incision once daily with soap and water  Call [phone] if questions   Driving Restrictions   Complete by: As directed    No driving for today   Lifting restrictions   Complete by: As directed    No lifting for 2 weeks   Resume previous diet   Complete by: As directed    may wash over wound with mild soap and water   Complete by: As directed      Allergies as of 09/27/2018   No Known Allergies     Medication List    TAKE these medications   ascorbic acid 500 MG tablet Commonly known as: VITAMIN C Take 1 tablet (500 mg total) by mouth daily.   atorvastatin 80 MG tablet Commonly known as: LIPITOR Take 1 tablet (80 mg total) by mouth daily. Take 1/2 tab daily for one week, then increase to the full tab. What changed: additional instructions   Auryxia 1 GM 210 MG(Fe) tablet Generic drug: ferric citrate Take 630 mg by mouth 3 (three) times daily with meals.   b complex-vitamin c-folic acid 0.8 MG Tabs tablet Take 1 tablet by mouth Every Tuesday,Thursday,and Saturday with dialysis.   benzonatate 200 MG capsule Commonly known as: TESSALON Take 1 capsule (200 mg total) by mouth 3 (three) times daily as needed for cough.   diphenoxylate-atropine 2.5-0.025 MG tablet Commonly known as: Lomotil Take 1 tablet by mouth 4 (four) times daily as needed for diarrhea or loose stools.   Enbrel 50 MG/ML injection Generic drug: etanercept Inject 50 mg into the skin once a week.   gabapentin 100 MG capsule Commonly known as: NEURONTIN Take 100 mg by mouth daily.   guaiFENesin 600 MG 12 hr tablet Commonly known as: MUCINEX Take 2 tablets (1,200 mg total) by mouth 2 (two) times daily.   HumaLOG Mix 75/25 KwikPen (75-25) 100 UNIT/ML Kwikpen Generic drug: Insulin Lispro Prot & Lispro Inject 30 Units into the skin 2 (two) times daily with a meal.   hydrOXYzine 25 MG  tablet Commonly known as: ATARAX/VISTARIL Take 25 mg by mouth daily.   metoprolol succinate 25 MG 24 hr tablet Commonly known as: TOPROL-XL Take 1 tablet (25 mg total) by mouth daily.   oxyCODONE 15 MG immediate release tablet Commonly known as: ROXICODONE Take 1 tablet (15 mg total) by mouth daily as needed for pain.   pramipexole 0.5 MG tablet Commonly known as: MIRAPEX Take 0.5 mg by mouth  daily.   zinc sulfate 220 (50 Zn) MG capsule Take 1 capsule (220 mg total) by mouth daily.      Verbal and written Discharge instructions given to the patient. Wound care per Discharge AVS Follow-up Information    Elam Dutch, MD Follow up in 10 week(s).   Specialties: Vascular Surgery, Cardiology Why: office will call Contact information: [address] [place] [address] [phone]           Signed: Roxy Horseman 09/28/2018, 1:47 PM

## 2018-10-03 DIAGNOSIS — U071 COVID-19: Secondary | ICD-10-CM

## 2018-10-03 HISTORY — DX: COVID-19: U07.1

## 2018-10-04 ENCOUNTER — Encounter: Payer: Medicare Other | Admitting: Vascular Surgery

## 2018-10-04 ENCOUNTER — Ambulatory Visit (HOSPITAL_COMMUNITY): Payer: Medicare Other

## 2018-10-09 ENCOUNTER — Inpatient Hospital Stay (HOSPITAL_COMMUNITY)
Admission: EM | Admit: 2018-10-09 | Discharge: 2018-10-14 | DRG: 070 | Disposition: A | Discharge status: Home / Self Care | Payer: Medicare Other | Attending: Internal Medicine | Admitting: Internal Medicine

## 2018-10-09 ENCOUNTER — Encounter (HOSPITAL_COMMUNITY): Payer: Self-pay | Admitting: Internal Medicine

## 2018-10-09 ENCOUNTER — Other Ambulatory Visit: Payer: Self-pay

## 2018-10-09 ENCOUNTER — Emergency Department (HOSPITAL_COMMUNITY): Payer: Medicare Other

## 2018-10-09 DIAGNOSIS — G4730 Sleep apnea, unspecified: Secondary | ICD-10-CM | POA: Diagnosis present

## 2018-10-09 DIAGNOSIS — Z833 Family history of diabetes mellitus: Secondary | ICD-10-CM

## 2018-10-09 DIAGNOSIS — Z6838 Body mass index (BMI) 38.0-38.9, adult: Secondary | ICD-10-CM | POA: Diagnosis not present

## 2018-10-09 DIAGNOSIS — Z79899 Other long term (current) drug therapy: Secondary | ICD-10-CM

## 2018-10-09 DIAGNOSIS — E8770 Fluid overload, unspecified: Secondary | ICD-10-CM | POA: Diagnosis present

## 2018-10-09 DIAGNOSIS — Z8349 Family history of other endocrine, nutritional and metabolic diseases: Secondary | ICD-10-CM

## 2018-10-09 DIAGNOSIS — E1165 Type 2 diabetes mellitus with hyperglycemia: Secondary | ICD-10-CM | POA: Diagnosis present

## 2018-10-09 DIAGNOSIS — Z992 Dependence on renal dialysis: Secondary | ICD-10-CM

## 2018-10-09 DIAGNOSIS — E669 Obesity, unspecified: Secondary | ICD-10-CM | POA: Diagnosis present

## 2018-10-09 DIAGNOSIS — Z794 Long term (current) use of insulin: Secondary | ICD-10-CM

## 2018-10-09 DIAGNOSIS — E8889 Other specified metabolic disorders: Secondary | ICD-10-CM | POA: Diagnosis present

## 2018-10-09 DIAGNOSIS — J189 Pneumonia, unspecified organism: Secondary | ICD-10-CM | POA: Diagnosis not present

## 2018-10-09 DIAGNOSIS — I120 Hypertensive chronic kidney disease with stage 5 chronic kidney disease or end stage renal disease: Secondary | ICD-10-CM | POA: Diagnosis present

## 2018-10-09 DIAGNOSIS — R0602 Shortness of breath: Secondary | ICD-10-CM | POA: Diagnosis present

## 2018-10-09 DIAGNOSIS — E785 Hyperlipidemia, unspecified: Secondary | ICD-10-CM | POA: Diagnosis present

## 2018-10-09 DIAGNOSIS — G9341 Metabolic encephalopathy: Secondary | ICD-10-CM | POA: Diagnosis present

## 2018-10-09 DIAGNOSIS — M170 Bilateral primary osteoarthritis of knee: Secondary | ICD-10-CM | POA: Diagnosis present

## 2018-10-09 DIAGNOSIS — I4510 Unspecified right bundle-branch block: Secondary | ICD-10-CM | POA: Diagnosis present

## 2018-10-09 DIAGNOSIS — E1152 Type 2 diabetes mellitus with diabetic peripheral angiopathy with gangrene: Secondary | ICD-10-CM | POA: Diagnosis present

## 2018-10-09 DIAGNOSIS — N186 End stage renal disease: Secondary | ICD-10-CM

## 2018-10-09 DIAGNOSIS — N2581 Secondary hyperparathyroidism of renal origin: Secondary | ICD-10-CM | POA: Diagnosis present

## 2018-10-09 DIAGNOSIS — G2581 Restless legs syndrome: Secondary | ICD-10-CM | POA: Diagnosis present

## 2018-10-09 DIAGNOSIS — U071 COVID-19: Secondary | ICD-10-CM

## 2018-10-09 DIAGNOSIS — I96 Gangrene, not elsewhere classified: Secondary | ICD-10-CM | POA: Diagnosis present

## 2018-10-09 DIAGNOSIS — E118 Type 2 diabetes mellitus with unspecified complications: Secondary | ICD-10-CM

## 2018-10-09 DIAGNOSIS — J9601 Acute respiratory failure with hypoxia: Secondary | ICD-10-CM

## 2018-10-09 DIAGNOSIS — Z789 Other specified health status: Secondary | ICD-10-CM

## 2018-10-09 DIAGNOSIS — I2510 Atherosclerotic heart disease of native coronary artery without angina pectoris: Secondary | ICD-10-CM | POA: Diagnosis present

## 2018-10-09 DIAGNOSIS — D638 Anemia in other chronic diseases classified elsewhere: Secondary | ICD-10-CM | POA: Diagnosis not present

## 2018-10-09 DIAGNOSIS — R008 Other abnormalities of heart beat: Secondary | ICD-10-CM | POA: Diagnosis present

## 2018-10-09 DIAGNOSIS — T426X5A Adverse effect of other antiepileptic and sedative-hypnotic drugs, initial encounter: Secondary | ICD-10-CM | POA: Diagnosis present

## 2018-10-09 DIAGNOSIS — F17210 Nicotine dependence, cigarettes, uncomplicated: Secondary | ICD-10-CM | POA: Diagnosis present

## 2018-10-09 DIAGNOSIS — Z8249 Family history of ischemic heart disease and other diseases of the circulatory system: Secondary | ICD-10-CM

## 2018-10-09 DIAGNOSIS — G9340 Encephalopathy, unspecified: Secondary | ICD-10-CM | POA: Diagnosis present

## 2018-10-09 DIAGNOSIS — D631 Anemia in chronic kidney disease: Secondary | ICD-10-CM | POA: Diagnosis present

## 2018-10-09 DIAGNOSIS — E1122 Type 2 diabetes mellitus with diabetic chronic kidney disease: Secondary | ICD-10-CM | POA: Diagnosis present

## 2018-10-09 DIAGNOSIS — Z09 Encounter for follow-up examination after completed treatment for conditions other than malignant neoplasm: Secondary | ICD-10-CM

## 2018-10-09 HISTORY — DX: Encephalopathy, unspecified: G93.40

## 2018-10-09 LAB — CBC WITH DIFFERENTIAL/PLATELET
Abs Immature Granulocytes: 0.04 10*3/uL (ref 0.00–0.07)
Basophils Absolute: 0 10*3/uL (ref 0.0–0.1)
Basophils Relative: 0 %
Eosinophils Absolute: 0.3 10*3/uL (ref 0.0–0.5)
Eosinophils Relative: 4 %
HCT: 22.7 % — ABNORMAL LOW (ref 36.0–46.0)
Hemoglobin: 7 g/dL — ABNORMAL LOW (ref 12.0–15.0)
Immature Granulocytes: 1 %
Lymphocytes Relative: 16 %
Lymphs Abs: 1.2 10*3/uL (ref 0.7–4.0)
MCH: 31.1 pg (ref 26.0–34.0)
MCHC: 30.8 g/dL (ref 30.0–36.0)
MCV: 100.9 fL — ABNORMAL HIGH (ref 80.0–100.0)
Monocytes Absolute: 0.5 10*3/uL (ref 0.1–1.0)
Monocytes Relative: 7 %
Neutro Abs: 5.6 10*3/uL (ref 1.7–7.7)
Neutrophils Relative %: 72 %
Platelets: 192 10*3/uL (ref 150–400)
RBC: 2.25 MIL/uL — ABNORMAL LOW (ref 3.87–5.11)
RDW: 17.2 % — ABNORMAL HIGH (ref 11.5–15.5)
WBC: 7.7 10*3/uL (ref 4.0–10.5)
nRBC: 0 % (ref 0.0–0.2)

## 2018-10-09 LAB — BASIC METABOLIC PANEL
Anion gap: 16 — ABNORMAL HIGH (ref 5–15)
BUN: 36 mg/dL — ABNORMAL HIGH (ref 6–20)
CO2: 29 mmol/L (ref 22–32)
Calcium: 9.1 mg/dL (ref 8.9–10.3)
Chloride: 95 mmol/L — ABNORMAL LOW (ref 98–111)
Creatinine, Ser: 7.75 mg/dL — ABNORMAL HIGH (ref 0.44–1.00)
GFR calc Af Amer: 6 mL/min — ABNORMAL LOW (ref >60)
GFR calc non Af Amer: 5 mL/min — ABNORMAL LOW (ref >60)
Glucose, Bld: 273 mg/dL — ABNORMAL HIGH (ref 70–99)
Potassium: 3.6 mmol/L (ref 3.5–5.1)
Sodium: 140 mmol/L (ref 135–145)

## 2018-10-09 LAB — POCT I-STAT EG7
Acid-Base Excess: 4 mmol/L — ABNORMAL HIGH (ref 0.0–2.0)
Bicarbonate: 28.6 mmol/L — ABNORMAL HIGH (ref 20.0–28.0)
Calcium, Ion: 1.09 mmol/L — ABNORMAL LOW (ref 1.15–1.40)
HCT: 23 % — ABNORMAL LOW (ref 36.0–46.0)
Hemoglobin: 7.8 g/dL — ABNORMAL LOW (ref 12.0–15.0)
O2 Saturation: 81 %
Potassium: 3.5 mmol/L (ref 3.5–5.1)
Sodium: 139 mmol/L (ref 135–145)
TCO2: 30 mmol/L (ref 22–32)
pCO2, Ven: 40.7 mmHg — ABNORMAL LOW (ref 44.0–60.0)
pH, Ven: 7.455 — ABNORMAL HIGH (ref 7.250–7.430)
pO2, Ven: 43 mmHg (ref 32.0–45.0)

## 2018-10-09 LAB — SARS CORONAVIRUS 2 BY RT PCR (HOSPITAL ORDER, PERFORMED IN ~~LOC~~ HOSPITAL LAB): SARS Coronavirus 2: POSITIVE — AB

## 2018-10-09 LAB — CBG MONITORING, ED: Glucose-Capillary: 144 mg/dL — ABNORMAL HIGH (ref 70–99)

## 2018-10-09 LAB — TROPONIN I (HIGH SENSITIVITY): Troponin I (High Sensitivity): 38 ng/L — ABNORMAL HIGH (ref <18)

## 2018-10-09 MED ORDER — ONDANSETRON HCL 4 MG/2ML IJ SOLN: 4.0000 mg | Freq: Four times a day (QID) | INTRAMUSCULAR | Status: DC | PRN

## 2018-10-09 MED ORDER — HEPARIN SODIUM (PORCINE) 5000 UNIT/ML IJ SOLN
5000.0000 [IU] | Freq: Three times a day (TID) | INTRAMUSCULAR | Status: DC
  Administered 2018-10-10 – 2018-10-11 (×4): 5000 [IU] via SUBCUTANEOUS
  Filled 2018-10-09 (×5): qty 1

## 2018-10-09 MED ORDER — ONDANSETRON HCL 4 MG PO TABS: 4.0000 mg | ORAL_TABLET | Freq: Four times a day (QID) | ORAL | Status: DC | PRN

## 2018-10-09 MED ORDER — SUCCINYLCHOLINE CHLORIDE 20 MG/ML IJ SOLN: INTRAMUSCULAR | Status: AC | PRN

## 2018-10-09 MED ORDER — ACETAMINOPHEN 325 MG PO TABS: 650.0000 mg | ORAL_TABLET | Freq: Four times a day (QID) | ORAL | Status: DC | PRN

## 2018-10-09 MED ORDER — ACETAMINOPHEN 650 MG RE SUPP: 650.0000 mg | Freq: Four times a day (QID) | RECTAL | Status: DC | PRN

## 2018-10-09 MED ORDER — NALOXONE HCL 0.4 MG/ML IJ SOLN
0.4000 mg | Freq: Once | INTRAMUSCULAR | Status: AC
  Administered 2018-10-09: 20:00:00 0.4 mg via INTRAVENOUS
  Filled 2018-10-09: qty 1

## 2018-10-09 MED ORDER — ETOMIDATE 2 MG/ML IV SOLN: INTRAVENOUS | Status: AC | PRN

## 2018-10-09 MED ORDER — PROPOFOL 1000 MG/100ML IV EMUL
INTRAVENOUS | Status: AC
  Filled 2018-10-09: qty 100

## 2018-10-09 NOTE — ED Triage Notes (Signed)
Brought in by [facility] for chest pressure, starting about 2 hours ago. Pt states she was treated for pneumonia but still has cough and congestion.Was admitted at [facility] with covid within the past 2 weeks. Pt has dialysis on TTS. States she was told last week by dialysis that she needs a blood transfusion.

## 2018-10-09 NOTE — Code Documentation (Addendum)
Error in charting.

## 2018-10-09 NOTE — Progress Notes (Signed)
RT called to assess patient for BIPAP. Patient on 3L [place] when RT entered room. Patient does not have negative COVID result.  RT switched patient to High Flow Nasal Cannula(salter) at 6L and placed mask back on patient. Patient's respiratory rate is 20 at this time. Spo2 100%.  RT will continue to monitor as needed.

## 2018-10-09 NOTE — ED Provider Notes (Signed)
[place] EMERGENCY DEPARTMENT Provider Note   CSN: [id number] Arrival date & time: 10/09/18  1651     History   Chief Complaint Chief Complaint  Patient presents with  . Chest Pain    HPI [name] is a 53 y.o. female with PMH/o ESRD with T, TH, S dialysis who presents for evaluation of chest pain and shortness of breath that began about 2 or 3 hours ago.  She describes it as a tightness across her chest.  No associated diaphoresis, nausea/vomiting.  Not worse with exertion or deep inspiration.  She also comes in because she states that she was told that she needs a "transfusion" for low blood.  She does not know why she was told that she needs this.  She states she has not had any abnormal bleeding or melena.  She has never had a transfusion before.  She also reports that she was recently diagnosed with COVID (09/14/18) and was mated to the hospital and was recently discharged on 09/27/18.  She thinks she may have been diagnosed with pneumonia and that she may be supposed to be on antibiotic but she has not been taking anything.  She reports her last dialysis session was on Friday, 10/06/2018.  She states she continues to have a lingering cough that is productive of phlegm.  She has not had any fevers, abdominal pain, nausea/vomiting, numbness/weakness of arms or legs.     The history is provided by the patient.    Past Medical History:  Diagnosis Date  . Anemia   . Arthritis    "knees"  . CAD (coronary artery disease)    Nonobstructive on CT 2019  . ESRD (end stage renal disease) ([place])    Dialysis M/W/F  . Headache(784.0)   . High cholesterol   . Hypertension   . 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 Pericardial effusion   . Pneumonia    x 2   . PVD (peripheral vascular disease) ([place])    Right leg stent in [place].  (No records)  . Restless legs   . Sleep apnea    does not use Cpap  . Type II diabetes mellitus [facility])      Patient Active Problem List   Diagnosis Date Noted  . Acute encephalopathy 10/09/2018  . ESRD (end stage renal disease) ([place]) 09/27/2018  . Acute respiratory failure with hypoxia ([place]) 09/26/2018  . Chronic pain disorder 09/26/2018  . COVID-19 virus infection 09/18/2018  . Acute metabolic encephalopathy 92/42/6834  . Gangrene ([place]) 09/18/2018  . Left arm pain 06/27/2018  . Educated About Covid-19 Virus Infection 06/27/2018  . Anemia 09/26/2017  . Left hip pain 09/26/2017  . Neck pain 09/26/2017  . Leukocytosis 09/26/2017  . Chest pain 07/07/2017  . Left ventricular hypertrophy 06/17/2017  . ESRD on dialysis ([place]) 05/21/2011  . Diabetic retinopathy 05/21/2011  . Diastolic dysfunction 19/62/2297  . Hyperlipidemia, mixed 01/30/2011  . Type II diabetes mellitus with complication, uncontrolled ([place]) 01/29/2011  . Neuropathy 01/29/2011  . Hypertension     Past Surgical History:  Procedure Laterality Date  . AV FISTULA PLACEMENT Left   . [place]; 1997   x 2  . COLONOSCOPY    . EYE SURGERY Bilateral    cataract surgery  . INSERTION OF DIALYSIS CATHETER Right 09/26/2018   Procedure: INSERTION OF DIALYSIS CATHETER Right Internal Jugular.;  Surgeon: Elam Dutch, MD;  Location: [facility]  OR;  Service: Vascular;  Laterality: Right;  . LIGATION OF ARTERIOVENOUS  FISTULA Left 09/26/2018   Procedure: LIGATION OF ARTERIOVENOUS  FISTULA LEFT ARM;  Surgeon: Elam Dutch, MD;  Location: [place];  Service: Vascular;  Laterality: Left;  . PERICARDIAL WINDOW  02/2004   for pericardial effusion  . TUBAL LIGATION  05/1995     OB History   No obstetric history on file.      Home Medications    Prior to Admission medications   Medication Sig Start Date End Date Taking? Authorizing Provider  atorvastatin (LIPITOR) 80 MG tablet Take 1 tablet (80 mg total) by mouth daily. Take 1/2 tab daily for one week, then increase to the full tab. Patient taking differently: Take 80 mg by  mouth daily.  06/01/17   Tereasa Coop, PA-C  AURYXIA 1 GM 210 MG(Fe) tablet Take 630 mg by mouth 3 (three) times daily with meals.  05/29/18   [provider]  b complex-vitamin c-folic acid (NEPHRO-VITE) 0.8 MG TABS tablet Take 1 tablet by mouth Every Tuesday,Thursday,and Saturday with dialysis.  07/17/18   [provider]  benzonatate (TESSALON) 200 MG capsule Take 1 capsule (200 mg total) by mouth 3 (three) times daily as needed for cough. 09/27/18   Raiford Noble Latif, DO  diphenoxylate-atropine (LOMOTIL) 2.5-0.025 MG tablet Take 1 tablet by mouth 4 (four) times daily as needed for diarrhea or loose stools. 09/06/17   Kirichenko, Tatyana, PA-C  ENBREL 50 MG/ML injection Inject 50 mg into the skin once a week.  07/06/18   [provider]  gabapentin (NEURONTIN) 100 MG capsule Take 100 mg by mouth daily.  04/28/17   [provider]  guaiFENesin (MUCINEX) 600 MG 12 hr tablet Take 2 tablets (1,200 mg total) by mouth 2 (two) times daily. 09/27/18   Raiford Noble Latif, DO  hydrOXYzine (ATARAX/VISTARIL) 25 MG tablet Take 25 mg by mouth daily.  05/12/17   [provider]  Insulin Lispro Prot & Lispro (HUMALOG MIX 75/25 KWIKPEN) (75-25) 100 UNIT/ML Kwikpen Inject 30 Units into the skin 2 (two) times daily with a meal.     [provider]  metoprolol succinate (TOPROL-XL) 25 MG 24 hr tablet Take 1 tablet (25 mg total) by mouth daily. 06/01/17   Tereasa Coop, PA-C  oxyCODONE (ROXICODONE) 15 MG immediate release tablet Take 1 tablet (15 mg total) by mouth daily as needed for pain. 09/20/18   Mariel Aloe, MD  pramipexole (MIRAPEX) 0.5 MG tablet Take 0.5 mg by mouth daily.  05/18/17   [provider]  vitamin C (VITAMIN C) 500 MG tablet Take 1 tablet (500 mg total) by mouth daily. 09/21/18   Mariel Aloe, MD  zinc sulfate 220 (50 Zn) MG capsule Take 1 capsule (220 mg total) by mouth daily. 09/21/18   Mariel Aloe, MD    Family History Family  History  Problem Relation Age of Onset  . Cancer Brother   . Heart disease Father        Died age 21  . Diabetes Father   . Hyperlipidemia Father   . Hypertension Father   . Stroke Father   . Diabetes Mother   . Hypertension Mother   . Stroke Mother   . Dementia Mother   . Diabetes Sister   . Diabetes Brother   . Hypertension Sister   . Hypertension Brother     Social History Social History   Tobacco Use  . Smoking  status: Current Every Day Smoker    Packs/day: 0.50    Years: 20.00    Pack years: 10.00    Types: Cigarettes  . Smokeless tobacco: Never Used  . Tobacco comment: Trying to quit  Substance Use Topics  . Alcohol use: No  . Drug use: No     Allergies   Patient has no known allergies.   Review of Systems Review of Systems  Constitutional: Negative for fever.  Respiratory: Positive for cough and shortness of breath.   Cardiovascular: Positive for chest pain.  Gastrointestinal: Negative for abdominal pain, nausea and vomiting.  Genitourinary: Negative for dysuria and hematuria.  Neurological: Negative for headaches.  All other systems reviewed and are negative.    Physical Exam Updated Vital Signs BP (!) 135/115   Pulse 78   Temp 98.4 F (36.9 C) (Oral)   Resp 18   Ht 5\' 8"  (1.727 m)   Wt 108.9 kg   LMP  (LMP Unknown)   SpO2 99%   BMI 36.49 kg/m   Physical Exam Vitals signs and nursing note reviewed.  Constitutional:      Appearance: Normal appearance. She is well-developed.     Comments: Intermittently somnolent and nods off while we are talking but is easily arousable with verbal stimuli.  HENT:     Head: Normocephalic and atraumatic.  Eyes:     General: Lids are normal.     Conjunctiva/sclera: Conjunctivae normal.     Pupils: Pupils are equal, round, and reactive to light.  Neck:     Musculoskeletal: Full passive range of motion without pain.  Cardiovascular:     Rate and Rhythm: Normal rate and regular rhythm.     Pulses:  Normal pulses.          Radial pulses are 2+ on the right side and 2+ on the left side.       Dorsalis pedis pulses are 2+ on the right side and 2+ on the left side.     Heart sounds: Normal heart sounds. No murmur. No friction rub. No gallop.   Pulmonary:     Effort: Pulmonary effort is normal. Tachypnea present.     Breath sounds: Normal breath sounds. No rhonchi.     Comments: Diffuse rhonchi.  No decreased breath sounds.  No wheezing noted.  Increased work of breathing noted. Chest:     Comments: Port noted to anterior right chest wall.  No surrounding warmth, erythema. Abdominal:     Palpations: Abdomen is soft. Abdomen is not rigid.     Tenderness: There is no abdominal tenderness. There is no guarding.     Comments: Abdomen is soft, non-distended, non-tender. No rigidity, No guarding. No peritoneal signs.  Musculoskeletal: Normal range of motion.  Skin:    General: Skin is warm and dry.     Capillary Refill: Capillary refill takes less than 2 seconds.     Comments: Left thumb and bandage.  Neurological:     Mental Status: She is oriented to person, place, and time and easily aroused.  Psychiatric:        Speech: Speech normal.      ED Treatments / Results  Labs (all labs ordered are listed, but only abnormal results are displayed) Labs Reviewed  SARS CORONAVIRUS 2 ([facility]) - Abnormal; Notable for the following components:      Result Value   SARS Coronavirus 2 POSITIVE (*)    All other components within  normal limits  BASIC METABOLIC PANEL - Abnormal; Notable for the following components:   Chloride 95 (*)    Glucose, Bld 273 (*)    BUN 36 (*)    Creatinine, Ser 7.75 (*)    GFR calc non Af Amer 5 (*)    GFR calc Af Amer 6 (*)    Anion gap 16 (*)    All other components within normal limits  CBC WITH DIFFERENTIAL/PLATELET - Abnormal; Notable for the following components:   RBC 2.25 (*)    Hemoglobin 7.0 (*)    HCT  22.7 (*)    MCV 100.9 (*)    RDW 17.2 (*)    All other components within normal limits  POCT I-STAT EG7 - Abnormal; Notable for the following components:   pH, Ven 7.455 (*)    pCO2, Ven 40.7 (*)    Bicarbonate 28.6 (*)    Acid-Base Excess 4.0 (*)    Calcium, Ion 1.09 (*)    HCT 23.0 (*)    Hemoglobin 7.8 (*)    All other components within normal limits  TROPONIN I (HIGH SENSITIVITY) - Abnormal; Notable for the following components:   Troponin I (High Sensitivity) 38 (*)    All other components within normal limits  AMMONIA  HEPATIC FUNCTION PANEL  TROPONIN I (HIGH SENSITIVITY)  TROPONIN I (HIGH SENSITIVITY)  TROPONIN I (HIGH SENSITIVITY)    EKG None  Radiology Dg Chest Portable 1 View  Result Date: 10/09/2018 CLINICAL DATA:  chest pressure, starting about 2 hours ago. Pt states she was treated for pneumonia but still has cough and congestion.Was admitted at [facility] with covid within the past 2 weeks. EXAM: PORTABLE CHEST 1 VIEW COMPARISON:  Chest radiograph 09/27/2018 FINDINGS: Stable cardiomediastinal contours with enlarged heart size. Right central venous catheter tip projects over the right atrium. Central venous congestion. There are persistent predominantly lower lobe heterogeneous opacities. No pneumothorax or large pleural effusion. No acute abnormality in the visualized skeleton. IMPRESSION: Persistent bilateral lower lung opacities could represent infection and/or edema. Electronically Signed   By: Audie Pinto M.D.   On: 10/09/2018 18:05    Procedures Procedures (including critical care time)  Medications Ordered in ED Medications  etomidate (AMIDATE) injection (20 mg Intravenous Canceled Entry 10/09/18 1653)  succinylcholine (ANECTINE) injection (150 mg Intravenous Canceled Entry 10/09/18 1654)  naloxone (NARCAN) injection 0.4 mg (0.4 mg Intravenous Given 10/09/18 2020)     Initial Impression / Assessment and Plan / ED Course  I have reviewed the triage  vital signs and the nursing notes.  Pertinent labs & imaging results that were available during my care of the patient were reviewed by me and considered in my medical decision making (see chart for details).  Clinical Course as of Oct 08 2248  Mon Oct 09, 2767  458 53 year old female end-stage renal disease recently discharged for Covid/somnolence/thumb wound here with chest pain that she feels is related to pneumonia.  She is somnolent but easily arousable here to voice and is able to speak in full sentences.  Afebrile here not hypoxic.  She is getting some screening labs chest x-ray EKG.   [MB]  1884 ECG is sinus rhythm with appears to be PACs and a right bundle branch block.  PACs are more frequent than prior EKG 8/20.   [MB]  1919 Patient with increased work of breathing.  She is somnolent still arousable to voice but seems a little more sluggish than before.  VBG does not show  any significant CO2 retention.  Troponin elevated but better than her last troponin.  She is got some scattered rhonchi on lung exam.  Satting well on nasal cannula.   [MB]    Clinical Course User Index [MB] Hayden Rasmussen, MD       53 year old female with past medical history of ESRD, recent COVID-19 diagnosis who presents for evaluation of chest pain and shortness of breath that began few hours prior to ED arrival.  Also reports that she was told she needs a transfusion.  On initial ED arrival, she is afebrile but is tachypneic.  She has some intermittent somnolence and occasionally nods off while we are talking but is easily arousable by verbal stimuli.  She does have some diffuse rhonchi but no wheezing noted.  Increased work of breathing and tachypnea noted.  Plan to check labs, chest x-ray, repeat cover test.  09/18/18: Patient scheduled for ligation of left arm AVF and catheter placement. She was COVID + on 09/14/18. On arrival she was lethargic and responding to sternal rub and coughing. At that time she  was admitted to [facility]. Went back to baseline on 09/19/18. She was discharged on 09/20/18.   09/26/18: Patient was again scheduled for the procedure that did not take place on the 17th.  She was at baseline but once she had the surgery, she was lethargic afterward and was satting between 79-82%.  They gave 2 rounds of Narcan which bumped her up to 99% on 5 L but she would continuously kept falling back asleep.  Tried hospitalist was consulted and patient was admitted.  She returned back to baseline on 09/27/2018 and was discharged from the hospital.  Patient descending down to mid 80s on room air.  She was placed on 3 L which bumped her up to 98%.  She has no oxygen requirement at baseline.  Tech alerted me that patient was more lethargic and not responding.  On my evaluation, she does appear to have some belly breathing.  She is more somnolent and drowsy.  She is arousable to physical stimuli.  She is being moved to high flow nasal cannula.  Given hypoxia as well as lethargy, plan for admission.  EG 7 shows pH of 7.4, PCO2 40.7, bicarb of 28.6.  Troponin slightly elevated at 38.  She does have a history of elevated troponins at baseline.  CBC shows hemoglobin of 7.0.  Her baseline is between 7 and 8.  No leukocytosis.  BMP shows BUN of 36, creatinine of 7.75.  Normal potassium.  She does appear more responsive given high flow nasal cannula.  She is pushing blankets and sheets away but still requires high flow nasal cannula.  Her COVID is positive.  Given hypoxia and concerns for increased lethargy, will plan for admission.  Discussed patient with Dr. Hal Hope (hospitalist). Will admit.   Christyne Mccain was evaluated in Emergency Department on 10/09/2018 for the symptoms described in the history of present illness. She was evaluated in the context of the global COVID-19 pandemic, which necessitated consideration that the patient might be at risk for infection with the SARS-CoV-2 virus that causes  COVID-19. Institutional protocols and algorithms that pertain to the evaluation of patients at risk for COVID-19 are in a state of rapid change based on information released by regulatory bodies including the CDC and federal and state organizations. These policies and algorithms were followed during the patient's care in the ED.  Portions of this note were generated with Dragon dictation  software. Dictation errors may occur despite best attempts at proofreading.   Final Clinical Impressions(s) / ED Diagnoses   Final diagnoses:  Acute respiratory failure with hypoxia ([place])  COVID-19 virus detected    ED Discharge Orders    None       Desma Mcgregor 10/09/18 2250    Hayden Rasmussen, MD 10/09/18 2320

## 2018-10-09 NOTE — H&P (Addendum)
History and Physical    [name] ERX:[id number] DOB: 1965-06-04 DOA: 10/09/2018  PCP: Sandi Mariscal, MD  Patient coming from: Home.  Chief Complaint: Chest pain and shortness of breath.  HPI: [name] is a 53 y.o. 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  Patient stated the ER physician that she was having some chest pain and shortness of breath last 2 days has been a persistent cough since he was diagnosed COVID-19.  Patient is not exactly characterizing the pain.  Patient also was saying that she was supposed to get blood transfusion.  Did not complain of any nausea vomiting abdominal pain diarrhea fever chills.  ED Course: In the ER patient became very lethargic and was given Narcan.  Patient ammonia levels were negative.  Venous blood gas did not show any carbon dioxide retention.  Chest x-ray shows bilateral infiltrates which is persistent.  Patient admitted for acute encephalopathy likely from pain medication and gabapentin.  Patient has had similar presentation last month twice.  At the time of my exam patient has become more alert awake answering questions appropriately and at this time patient is mildly short of breath and has been having productive cough.  At the time of my exam patient is chest pain-free.  Patient's labs show troponin of 3839 and 40.  Hemoglobin is around 7.  Patient is afebrile moving all extremities.  Review of Systems: As per HPI, rest all negative.   Past Medical History:  Diagnosis Date  . Anemia   . Arthritis    "knees"  . CAD (coronary artery disease)    Nonobstructive on CT 2019  . ESRD (end stage  renal disease) ([place])    Dialysis M/W/F  . Headache(784.0)   . High cholesterol   . Hypertension   . 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 Pericardial effusion   . Pneumonia    x 2   . PVD (peripheral vascular disease) ([place])    Right leg stent in [place].  (No records)  . Restless legs   . Sleep apnea    does not use Cpap  . Type II diabetes mellitus ([place])     Past Surgical History:  Procedure Laterality Date  . AV FISTULA PLACEMENT Left   . [place]; 1997   x 2  . COLONOSCOPY    . EYE SURGERY Bilateral    cataract surgery  . INSERTION OF DIALYSIS CATHETER Right 09/26/2018   Procedure: INSERTION OF DIALYSIS CATHETER Right Internal Jugular.;  Surgeon: Elam Dutch, MD;  Location: [facility];  Service: Vascular;  Laterality: Right;  . LIGATION OF ARTERIOVENOUS  FISTULA Left 09/26/2018   Procedure: LIGATION OF ARTERIOVENOUS  FISTULA LEFT ARM;  Surgeon: Elam Dutch, MD;  Location: [facility];  Service: Vascular;  Laterality: Left;  . PERICARDIAL WINDOW  02/2004   for pericardial effusion  . TUBAL LIGATION  05/1995     reports that she has been smoking cigarettes. She has a 10.00 pack-year smoking history. She has never used smokeless tobacco. She reports that she does not drink alcohol or use drugs.  No Known Allergies  Family  History  Problem Relation Age of Onset  . Cancer Brother   . Heart disease Father        Died age 39  . Diabetes Father   . Hyperlipidemia Father   . Hypertension Father   . Stroke Father   . Diabetes Mother   . Hypertension Mother   . Stroke Mother   . Dementia Mother   . Diabetes Sister   . Diabetes Brother   . Hypertension Sister   . Hypertension Brother     Prior to Admission medications   Medication Sig Start Date End Date Taking? Authorizing Provider  atorvastatin (LIPITOR) 80 MG tablet Take 1 tablet (80 mg total) by mouth daily. Take 1/2 tab daily for one week, then increase to the full tab.  Patient taking differently: Take 80 mg by mouth daily.  06/01/17   Tereasa Coop, PA-C  AURYXIA 1 GM 210 MG(Fe) tablet Take 630 mg by mouth 3 (three) times daily with meals.  05/29/18   [provider]  b complex-vitamin c-folic acid (NEPHRO-VITE) 0.8 MG TABS tablet Take 1 tablet by mouth Every Tuesday,Thursday,and Saturday with dialysis.  07/17/18   [provider]  benzonatate (TESSALON) 200 MG capsule Take 1 capsule (200 mg total) by mouth 3 (three) times daily as needed for cough. 09/27/18   Raiford Noble Latif, DO  diphenoxylate-atropine (LOMOTIL) 2.5-0.025 MG tablet Take 1 tablet by mouth 4 (four) times daily as needed for diarrhea or loose stools. 09/06/17   Kirichenko, Tatyana, PA-C  ENBREL 50 MG/ML injection Inject 50 mg into the skin once a week.  07/06/18   [provider]  gabapentin (NEURONTIN) 100 MG capsule Take 100 mg by mouth daily.  04/28/17   [provider]  guaiFENesin (MUCINEX) 600 MG 12 hr tablet Take 2 tablets (1,200 mg total) by mouth 2 (two) times daily. 09/27/18   Raiford Noble Latif, DO  hydrOXYzine (ATARAX/VISTARIL) 25 MG tablet Take 25 mg by mouth daily.  05/12/17   [provider]  Insulin Lispro Prot & Lispro (HUMALOG MIX 75/25 KWIKPEN) (75-25) 100 UNIT/ML Kwikpen Inject 30 Units into the skin 2 (two) times daily with a meal.     [provider]  metoprolol succinate (TOPROL-XL) 25 MG 24 hr tablet Take 1 tablet (25 mg total) by mouth daily. 06/01/17   Tereasa Coop, PA-C  oxyCODONE (ROXICODONE) 15 MG immediate release tablet Take 1 tablet (15 mg total) by mouth daily as needed for pain. 09/20/18   Mariel Aloe, MD  pramipexole (MIRAPEX) 0.5 MG tablet Take 0.5 mg by mouth daily.  05/18/17   [provider]  vitamin C (VITAMIN C) 500 MG tablet Take 1 tablet (500 mg total) by mouth daily. 09/21/18   Mariel Aloe, MD  zinc sulfate 220 (50 Zn) MG capsule Take 1 capsule (220 mg total) by mouth daily. 09/21/18   Mariel Aloe, MD    Physical Exam: Constitutional: Moderately built and nourished. Vitals:   10/09/18 2015 10/09/18 2030 10/09/18 2045 10/09/18 2100  BP: (!) 167/97 (!) 163/71 (!) 145/73 (!) 135/115  Pulse: (!) 54 68 78   Resp: 17 16 (!) 22 18  Temp:      TempSrc:      SpO2: 98% 99% 99%   Weight:      Height:       Eyes: Anicteric no pallor. ENMT: No discharge from the ears eyes nose or mouth. Neck: No mass felt.  No neck rigidity.  Respiratory: No rhonchi or crepitations. Cardiovascular: S1-S2 heard. Abdomen: Soft nontender bowel sounds present. Musculoskeletal: Left thumb is under dressing. Skin: Left thumb and is on dressing. Neurologic: Patient is more alert than before and following commands and oriented to name and place moves all extremities. Psychiatric: Oriented to her name and place.   Labs on Admission: I have personally reviewed following labs and imaging studies  CBC: Recent Labs  Lab 10/09/18 1725 10/09/18 1748  WBC 7.7  --   NEUTROABS 5.6  --   HGB 7.0* 7.8*  HCT 22.7* 23.0*  MCV 100.9*  --   PLT 192  --    Basic Metabolic Panel: Recent Labs  Lab 10/09/18 1725 10/09/18 1748  NA 140 139  K 3.6 3.5  CL 95*  --   CO2 29  --   GLUCOSE 273*  --   BUN 36*  --   CREATININE 7.75*  --   CALCIUM 9.1  --    GFR: Estimated Creatinine Clearance: 10.7 mL/min (A) (by C-G formula based on SCr of 7.75 mg/dL (H)). Liver Function Tests: No results for input(s): AST, ALT, ALKPHOS, BILITOT, PROT, ALBUMIN in the last 168 hours. No results for input(s): LIPASE, AMYLASE in the last 168 hours. No results for input(s): AMMONIA in the last 168 hours. Coagulation Profile: No results for input(s): INR, PROTIME in the last 168 hours. Cardiac Enzymes: No results for input(s): CKTOTAL, CKMB, CKMBINDEX, TROPONINI in the last 168 hours. BNP (last 3 results) No results for input(s): PROBNP in the last 8760 hours. HbA1C: No results for input(s): HGBA1C in the last 72 hours.  CBG: Recent Labs  Lab 10/09/18 2256  GLUCAP 144*   Lipid Profile: No results for input(s): CHOL, HDL, LDLCALC, TRIG, CHOLHDL, LDLDIRECT in the last 72 hours. Thyroid Function Tests: No results for input(s): TSH, T4TOTAL, FREET4, T3FREE, THYROIDAB in the last 72 hours. Anemia Panel: No results for input(s): VITAMINB12, FOLATE, FERRITIN, TIBC, IRON, RETICCTPCT in the last 72 hours. Urine analysis:    Component Value Date/Time   COLORURINE YELLOW 07/22/2011 1526   APPEARANCEUR CLOUDY (A) 07/22/2011 1526   LABSPEC 1.022 07/22/2011 1526   PHURINE 5.5 07/22/2011 1526   GLUCOSEU 100 (A) 07/22/2011 1526   HGBUR MODERATE (A) 07/22/2011 1526   BILIRUBINUR NEGATIVE 07/22/2011 1526   BILIRUBINUR NEG 07/19/2011 1202   KETONESUR NEGATIVE 07/22/2011 1526   PROTEINUR >300 (A) 07/22/2011 1526   UROBILINOGEN 0.2 07/22/2011 1526   NITRITE NEGATIVE 07/22/2011 1526   LEUKOCYTESUR TRACE (A) 07/22/2011 1526   Sepsis Labs: @LABRCNTIP (procalcitonin:4,lacticidven:4) ) Recent Results (from the past 240 hour(s))  SARS Coronavirus 2 [facility] order, Performed in [facility] hospital lab) Nasopharyngeal Nasopharyngeal Swab     Status: Abnormal   Collection Time: 10/09/18  7:06 PM   Specimen: Nasopharyngeal Swab  Result Value Ref Range Status   SARS Coronavirus 2 POSITIVE (A) NEGATIVE Final    Comment: RESULT CALLED TO, READ BACK BY AND VERIFIED WITH: RN S CRUZ @2150  10/09/18 BY S GEZAHEGN (NOTE) If result is NEGATIVE SARS-CoV-2 target nucleic acids are NOT DETECTED. The SARS-CoV-2 RNA is generally detectable in upper and lower  respiratory specimens during the acute phase of infection. The lowest  concentration of SARS-CoV-2 viral copies this assay can detect is 250  copies / mL. A negative result does not preclude SARS-CoV-2 infection  and should not be used as the sole basis for treatment or other  patient management decisions.  A negative result may occur with  improper specimen  collection /  handling, submission of specimen other  than nasopharyngeal swab, presence of viral mutation(s) within the  areas targeted by this assay, and inadequate number of viral copies  (<250 copies / mL). A negative result must be combined with clinical  observations, patient history, and epidemiological information. If result is POSITIVE SARS-CoV-2 target nucleic acids are DETECTED.  The SARS-CoV-2 RNA is generally detectable in upper and lower  respiratory specimens during the acute phase of infection.  Positive  results are indicative of active infection with SARS-CoV-2.  Clinical  correlation with patient history and other diagnostic information is  necessary to determine patient infection status.  Positive results do  not rule out bacterial infection or co-infection with other viruses. If result is PRESUMPTIVE POSTIVE SARS-CoV-2 nucleic acids MAY BE PRESENT.   A presumptive positive result was obtained on the submitted specimen  and confirmed on repeat testing.  While 2019 novel coronavirus  (SARS-CoV-2) nucleic acids may be present in the submitted sample  additional confirmatory testing may be necessary for epidemiological  and / or clinical management purposes  to differentiate between  SARS-CoV-2 and other Sarbecovirus currently known to infect humans.  If clinically indicated additional testing with an alternate test  methodology [phone]) i s advised. The SARS-CoV-2 RNA is generally  detectable in upper and lower respiratory specimens during the acute  phase of infection. The expected result is Negative. Fact Sheet for Patients:  StrictlyIdeas.no Fact Sheet for Healthcare Providers: BankingDealers.co.za This test is not yet approved or cleared by the [place] FDA and has been authorized for detection and/or diagnosis of SARS-CoV-2 by FDA under an Emergency Use Authorization (EUA).  This EUA will remain in effect (meaning this  test can be used) for the duration of the COVID-19 declaration under Section 564(b)(1) of the Act, 21 U.S.C. section 360bbb-3(b)(1), unless the authorization is terminated or revoked sooner. Performed at [facility], [place] [address] [place], [place] [address]      Radiological Exams on Admission: Dg Chest Portable 1 View  Result Date: 10/09/2018 CLINICAL DATA:  chest pressure, starting about 2 hours ago. Pt states she was treated for pneumonia but still has cough and congestion.Was admitted at [facility] with covid within the past 2 weeks. EXAM: PORTABLE CHEST 1 VIEW COMPARISON:  Chest radiograph 09/27/2018 FINDINGS: Stable cardiomediastinal contours with enlarged heart size. Right central venous catheter tip projects over the right atrium. Central venous congestion. There are persistent predominantly lower lobe heterogeneous opacities. No pneumothorax or large pleural effusion. No acute abnormality in the visualized skeleton. IMPRESSION: Persistent bilateral lower lung opacities could represent infection and/or edema. Electronically Signed   By: Audie Pinto M.D.   On: 10/09/2018 18:05     Assessment/Plan Principal Problem:   Acute encephalopathy Active Problems:   Type II diabetes mellitus with complication, uncontrolled ([place])   ESRD on dialysis ([place])   COVID-19 virus infection   Gangrene ([place])    1. Acute metabolic encephalopathy suspect most likely from pain medication and gabapentin which I am holding off for now.  Closely observe.  No definite focal signs at this time.  Ammonia levels are normal.  VBG does not show any carbon dioxide retention. 2. Acute hypoxic respiratory failure likely from fluid overload.  Not in distress.  Consult nephrology for dialysis. 3. COVID-19 pneumonia -has had recent COVID pneumonia about 3 weeks ago.  Will check inflammatory markers and if positive will start Decadron.  Since patient is having persistent cough will start  empiric  antibiotics and check procalcitonin.  If procalcitonin is negative may discontinue antibiotics. 4. Diabetes mellitus type 2 on NovoLog 70/30 while in the hospital.  On sliding scale. 5. Gangrene of the left thumb being followed by vascular surgeon.  Had recent ligation done of the AV fistula for steal phenomenon. 6. Hypertension on Toprol. 7. Hyperlipidemia on statins. 8. Anemia likely from renal disease follow CBC.  Given the multiple comorbidities and patient's acute metabolic encephalopathy with acute respiratory failure with COVID-19 pneumonia patient will need close monitoring and inpatient status.  EKG pending.   DVT prophylaxis: Heparin. Code Status: Full code. Family Communication: Discussed with patient. Disposition Plan: To be determined. Consults called: None. Admission status: Inpatient.   Rise Patience MD [facility] Pager [phone].  If 7PM-7AM, please contact night-coverage [email or web address] Password [email or web address]  10/09/2018, 11:08 PM

## 2018-10-10 DIAGNOSIS — J189 Pneumonia, unspecified organism: Secondary | ICD-10-CM | POA: Insufficient documentation

## 2018-10-10 DIAGNOSIS — U071 COVID-19: Secondary | ICD-10-CM

## 2018-10-10 DIAGNOSIS — J9601 Acute respiratory failure with hypoxia: Secondary | ICD-10-CM

## 2018-10-10 HISTORY — DX: Pneumonia, unspecified organism: J18.9

## 2018-10-10 HISTORY — DX: COVID-19: U07.1

## 2018-10-10 LAB — TROPONIN I (HIGH SENSITIVITY)
Troponin I (High Sensitivity): 39 ng/L — ABNORMAL HIGH (ref <18)
Troponin I (High Sensitivity): 40 ng/L — ABNORMAL HIGH (ref <18)

## 2018-10-10 LAB — RENAL FUNCTION PANEL
Albumin: 3 g/dL — ABNORMAL LOW (ref 3.5–5.0)
Anion gap: 20 — ABNORMAL HIGH (ref 5–15)
BUN: 46 mg/dL — ABNORMAL HIGH (ref 6–20)
CO2: 24 mmol/L (ref 22–32)
Calcium: 8.7 mg/dL — ABNORMAL LOW (ref 8.9–10.3)
Chloride: 95 mmol/L — ABNORMAL LOW (ref 98–111)
Creatinine, Ser: 8.57 mg/dL — ABNORMAL HIGH (ref 0.44–1.00)
GFR calc Af Amer: 6 mL/min — ABNORMAL LOW (ref >60)
GFR calc non Af Amer: 5 mL/min — ABNORMAL LOW (ref >60)
Glucose, Bld: 274 mg/dL — ABNORMAL HIGH (ref 70–99)
Phosphorus: 5.8 mg/dL — ABNORMAL HIGH (ref 2.5–4.6)
Potassium: 5.1 mmol/L (ref 3.5–5.1)
Sodium: 139 mmol/L (ref 135–145)

## 2018-10-10 LAB — GLUCOSE, CAPILLARY
Glucose-Capillary: 178 mg/dL — ABNORMAL HIGH (ref 70–99)
Glucose-Capillary: 343 mg/dL — ABNORMAL HIGH (ref 70–99)
Glucose-Capillary: 416 mg/dL — ABNORMAL HIGH (ref 70–99)

## 2018-10-10 LAB — CBC
HCT: 23.4 % — ABNORMAL LOW (ref 36.0–46.0)
HCT: 23.9 % — ABNORMAL LOW (ref 36.0–46.0)
Hemoglobin: 7.1 g/dL — ABNORMAL LOW (ref 12.0–15.0)
Hemoglobin: 7.2 g/dL — ABNORMAL LOW (ref 12.0–15.0)
MCH: 31 pg (ref 26.0–34.0)
MCH: 31 pg (ref 26.0–34.0)
MCHC: 30.1 g/dL (ref 30.0–36.0)
MCHC: 30.3 g/dL (ref 30.0–36.0)
MCV: 102.2 fL — ABNORMAL HIGH (ref 80.0–100.0)
MCV: 103 fL — ABNORMAL HIGH (ref 80.0–100.0)
Platelets: 190 10*3/uL (ref 150–400)
Platelets: 202 10*3/uL (ref 150–400)
RBC: 2.29 MIL/uL — ABNORMAL LOW (ref 3.87–5.11)
RBC: 2.32 MIL/uL — ABNORMAL LOW (ref 3.87–5.11)
RDW: 17.2 % — ABNORMAL HIGH (ref 11.5–15.5)
RDW: 17.3 % — ABNORMAL HIGH (ref 11.5–15.5)
WBC: 10.1 10*3/uL (ref 4.0–10.5)
WBC: 8.7 10*3/uL (ref 4.0–10.5)
nRBC: 0 % (ref 0.0–0.2)
nRBC: 0 % (ref 0.0–0.2)

## 2018-10-10 LAB — HEPATIC FUNCTION PANEL
ALT: 11 U/L (ref 0–44)
AST: 15 U/L (ref 15–41)
Albumin: 2.9 g/dL — ABNORMAL LOW (ref 3.5–5.0)
Alkaline Phosphatase: 82 U/L (ref 38–126)
Bilirubin, Direct: <0.1 mg/dL (ref 0.0–0.2)
Total Bilirubin: 0.9 mg/dL (ref 0.3–1.2)
Total Protein: 7.4 g/dL (ref 6.5–8.1)

## 2018-10-10 LAB — PROCALCITONIN: Procalcitonin: 0.25 ng/mL

## 2018-10-10 LAB — CREATININE, SERUM
Creatinine, Ser: 8.31 mg/dL — ABNORMAL HIGH (ref 0.44–1.00)
GFR calc Af Amer: 6 mL/min — ABNORMAL LOW (ref >60)
GFR calc non Af Amer: 5 mL/min — ABNORMAL LOW (ref >60)

## 2018-10-10 LAB — CBG MONITORING, ED: Glucose-Capillary: 126 mg/dL — ABNORMAL HIGH (ref 70–99)

## 2018-10-10 LAB — FERRITIN: Ferritin: 1132 ng/mL — ABNORMAL HIGH (ref 11–307)

## 2018-10-10 LAB — AMMONIA: Ammonia: 28 umol/L (ref 9–35)

## 2018-10-10 LAB — C-REACTIVE PROTEIN: CRP: 6.7 mg/dL — ABNORMAL HIGH (ref <1.0)

## 2018-10-10 MED ORDER — ALTEPLASE 2 MG IJ SOLR: 2.0000 mg | Freq: Once | INTRAMUSCULAR | Status: DC | PRN

## 2018-10-10 MED ORDER — SODIUM CHLORIDE 0.9 % IV SOLN
2.0000 g | INTRAVENOUS | Status: DC
  Administered 2018-10-10: 2 g via INTRAVENOUS
  Filled 2018-10-10: qty 2

## 2018-10-10 MED ORDER — LIDOCAINE HCL (PF) 1 % IJ SOLN: 5.0000 mL | INTRAMUSCULAR | Status: DC | PRN

## 2018-10-10 MED ORDER — METOPROLOL SUCCINATE ER 25 MG PO TB24
25.0000 mg | ORAL_TABLET | Freq: Every day | ORAL | Status: DC
  Administered 2018-10-10 – 2018-10-14 (×4): 25 mg via ORAL
  Filled 2018-10-10 (×5): qty 1

## 2018-10-10 MED ORDER — DARBEPOETIN ALFA 200 MCG/0.4ML IJ SOSY
200.0000 ug | PREFILLED_SYRINGE | INTRAMUSCULAR | Status: DC
  Administered 2018-10-14: 200 ug via INTRAVENOUS
  Filled 2018-10-10: qty 0.4

## 2018-10-10 MED ORDER — HEPARIN SODIUM (PORCINE) 1000 UNIT/ML DIALYSIS
1000.0000 [IU] | INTRAMUSCULAR | Status: DC | PRN
  Filled 2018-10-10: qty 1

## 2018-10-10 MED ORDER — RENA-VITE PO TABS
1.0000 | ORAL_TABLET | Freq: Every day | ORAL | Status: DC
  Administered 2018-10-10 – 2018-10-13 (×4): 1 via ORAL
  Filled 2018-10-10 (×4): qty 1

## 2018-10-10 MED ORDER — HEPARIN SODIUM (PORCINE) 1000 UNIT/ML DIALYSIS
1000.0000 [IU] | INTRAMUSCULAR | Status: DC | PRN
  Administered 2018-10-10: 15:00:00 1000 [IU] via INTRAVENOUS_CENTRAL
  Filled 2018-10-10 (×2): qty 1

## 2018-10-10 MED ORDER — SODIUM CHLORIDE 0.9 % IV SOLN: 2.0000 g | INTRAVENOUS | Status: DC

## 2018-10-10 MED ORDER — SODIUM CHLORIDE 0.9 % IV SOLN: 100.0000 mL | INTRAVENOUS | Status: DC | PRN

## 2018-10-10 MED ORDER — METHYLPREDNISOLONE SODIUM SUCC 40 MG IJ SOLR
40.0000 mg | Freq: Two times a day (BID) | INTRAMUSCULAR | Status: DC
  Administered 2018-10-10 (×2): 40 mg via INTRAVENOUS
  Filled 2018-10-10 (×2): qty 1

## 2018-10-10 MED ORDER — HEPARIN SODIUM (PORCINE) 1000 UNIT/ML IJ SOLN
INTRAMUSCULAR | Status: AC
  Administered 2018-10-10: 1000 [IU] via INTRAVENOUS_CENTRAL
  Filled 2018-10-10: qty 4

## 2018-10-10 MED ORDER — INSULIN ASPART 100 UNIT/ML ~~LOC~~ SOLN
0.0000 [IU] | Freq: Three times a day (TID) | SUBCUTANEOUS | Status: DC
  Administered 2018-10-10: 7 [IU] via SUBCUTANEOUS
  Administered 2018-10-10: 2 [IU] via SUBCUTANEOUS
  Administered 2018-10-11: 5 [IU] via SUBCUTANEOUS
  Administered 2018-10-11: 3 [IU] via SUBCUTANEOUS
  Administered 2018-10-11 – 2018-10-12 (×2): 5 [IU] via SUBCUTANEOUS
  Administered 2018-10-12: 3 [IU] via SUBCUTANEOUS
  Administered 2018-10-12: 2 [IU] via SUBCUTANEOUS
  Administered 2018-10-13: 3 [IU] via SUBCUTANEOUS
  Administered 2018-10-13 – 2018-10-14 (×3): 2 [IU] via SUBCUTANEOUS

## 2018-10-10 MED ORDER — ATORVASTATIN CALCIUM 80 MG PO TABS
80.0000 mg | ORAL_TABLET | Freq: Every day | ORAL | Status: DC
  Administered 2018-10-10 – 2018-10-13 (×4): 80 mg via ORAL
  Filled 2018-10-10 (×4): qty 1

## 2018-10-10 MED ORDER — NEPRO/CARBSTEADY PO LIQD
237.0000 mL | Freq: Two times a day (BID) | ORAL | Status: DC
  Administered 2018-10-10 – 2018-10-14 (×8): 237 mL via ORAL
  Filled 2018-10-10: qty 237

## 2018-10-10 MED ORDER — CALCITRIOL 0.25 MCG PO CAPS
1.5000 ug | ORAL_CAPSULE | ORAL | Status: DC
  Administered 2018-10-10 – 2018-10-14 (×3): 1.5 ug via ORAL
  Filled 2018-10-10 (×3): qty 6

## 2018-10-10 MED ORDER — SODIUM CHLORIDE 0.9 % IV SOLN
2.0000 g | Freq: Once | INTRAVENOUS | Status: AC
  Administered 2018-10-10: 05:00:00 2 g via INTRAVENOUS
  Filled 2018-10-10: qty 2

## 2018-10-10 MED ORDER — ASPIRIN EC 81 MG PO TBEC
81.0000 mg | DELAYED_RELEASE_TABLET | Freq: Every day | ORAL | Status: DC
  Administered 2018-10-10 – 2018-10-13 (×4): 81 mg via ORAL
  Filled 2018-10-10 (×4): qty 1

## 2018-10-10 MED ORDER — PENTAFLUOROPROP-TETRAFLUOROETH EX AERO: 1.0000 "application " | INHALATION_SPRAY | CUTANEOUS | Status: DC | PRN

## 2018-10-10 MED ORDER — LIDOCAINE-PRILOCAINE 2.5-2.5 % EX CREA
1.0000 "application " | TOPICAL_CREAM | CUTANEOUS | Status: DC | PRN
  Filled 2018-10-10: qty 5

## 2018-10-10 MED ORDER — CHLORHEXIDINE GLUCONATE CLOTH 2 % EX PADS
6.0000 | MEDICATED_PAD | Freq: Every day | CUTANEOUS | Status: DC
  Administered 2018-10-10 – 2018-10-14 (×5): 6 via TOPICAL

## 2018-10-10 MED ORDER — VANCOMYCIN HCL IN DEXTROSE 1-5 GM/200ML-% IV SOLN
1000.0000 mg | INTRAVENOUS | Status: DC
  Administered 2018-10-10: 1000 mg via INTRAVENOUS
  Filled 2018-10-10: qty 200

## 2018-10-10 MED ORDER — VANCOMYCIN HCL IN DEXTROSE 1-5 GM/200ML-% IV SOLN: 1000.0000 mg | INTRAVENOUS | Status: DC

## 2018-10-10 MED ORDER — LIDOCAINE-PRILOCAINE 2.5-2.5 % EX CREA: 1.0000 "application " | TOPICAL_CREAM | CUTANEOUS | Status: DC | PRN

## 2018-10-10 MED ORDER — VANCOMYCIN HCL 10 G IV SOLR
2000.0000 mg | Freq: Once | INTRAVENOUS | Status: AC
  Administered 2018-10-10: 06:00:00 2000 mg via INTRAVENOUS
  Filled 2018-10-10 (×2): qty 2000

## 2018-10-10 MED ORDER — INSULIN ASPART PROT & ASPART (70-30 MIX) 100 UNIT/ML ~~LOC~~ SUSP
25.0000 [IU] | Freq: Two times a day (BID) | SUBCUTANEOUS | Status: DC
  Administered 2018-10-10 – 2018-10-13 (×7): 25 [IU] via SUBCUTANEOUS
  Filled 2018-10-10 (×2): qty 10

## 2018-10-10 MED ORDER — HEPARIN SODIUM (PORCINE) 1000 UNIT/ML DIALYSIS
20.0000 [IU]/kg | INTRAMUSCULAR | Status: DC | PRN
  Filled 2018-10-10: qty 3

## 2018-10-10 NOTE — ED Notes (Signed)
Family at bedside. 

## 2018-10-10 NOTE — ED Notes (Signed)
ED TO INPATIENT HANDOFF REPORT  ED Nurse Name and Phone #: [facility] [phone]  S Name/Age/Gender [name] 53 y.o. female Room/Bed: 023C/023C  Code Status   Code Status: Full Code  Home/SNF/Other Home Patient oriented to: self, place and situation Is this baseline? No   Triage Complete: Triage complete  Chief Complaint chest tightness from dialysis  Triage Note Brought in by [facility] for chest pressure, starting about 2 hours ago. Pt states she was treated for pneumonia but still has cough and congestion.Was admitted at [facility] with covid within the past 2 weeks. Pt has dialysis on TTS. States she was told last week by dialysis that she needs a blood transfusion.    Allergies No Known Allergies  Level of Care/Admitting Diagnosis ED Disposition    ED Disposition Condition Comment   Admit  Hospital Area: [place] [[id number]]  Level of Care: Progressive [102]  Covid Evaluation: Confirmed COVID Positive  Diagnosis: Acute encephalopathy [390300]  Admitting Physician: Rise Patience [phone]  Attending Physician: Rise Patience [phone]  Estimated length of stay: past midnight tomorrow  Certification:: I certify this patient will need inpatient services for at least 2 midnights  PT Class (Do Not Modify): Inpatient [101]  PT Acc Code (Do Not Modify): Private [1]       B Medical/Surgery History Past Medical History:  Diagnosis Date  . Anemia   . Arthritis    "knees"  . CAD (coronary artery disease)    Nonobstructive on CT 2019  . ESRD (end stage renal disease) ([place])    Dialysis M/W/F  . Headache(784.0)   . High cholesterol   . Hypertension   . 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 Pericardial effusion   . Pneumonia    x 2   . PVD (peripheral vascular disease) ([place])    Right leg stent in [place].  (No records)  . Restless legs   . Sleep apnea    does not use Cpap  . Type II diabetes mellitus ([place])     Past Surgical History:  Procedure Laterality Date  . AV FISTULA PLACEMENT Left   . [place]; 1997   x 2  . COLONOSCOPY    . EYE SURGERY Bilateral    cataract surgery  . INSERTION OF DIALYSIS CATHETER Right 09/26/2018   Procedure: INSERTION OF DIALYSIS CATHETER Right Internal Jugular.;  Surgeon: Elam Dutch, MD;  Location: [place];  Service: Vascular;  Laterality: Right;  . LIGATION OF ARTERIOVENOUS  FISTULA Left 09/26/2018   Procedure: LIGATION OF ARTERIOVENOUS  FISTULA LEFT ARM;  Surgeon: Elam Dutch, MD;  Location: [place];  Service: Vascular;  Laterality: Left;  . PERICARDIAL WINDOW  02/2004   for pericardial effusion  . TUBAL LIGATION  05/1995     A IV Location/Drains/Wounds Patient Lines/Drains/Airways Status   Active Line/Drains/Airways    Name:   Placement date:   Placement time:   Site:   Days:   Peripheral IV 10/09/18 Left Hand   10/09/18    -    Hand   1   Fistula / Graft Left Upper arm Arteriovenous vein graft   -    -    Upper arm      Hemodialysis Catheter Right Internal jugular Double lumen Permanent (Tunneled)   09/26/18    1123    Internal jugular   14   Incision (Closed) 09/26/18 Chest Right  09/26/18    1243     14   Incision (Closed) 09/26/18 Arm Left   09/26/18    1243     14   Incision (Closed) 09/26/18 Hand Left   09/26/18    1623     14          Intake/Output Last 24 hours No intake or output data in the 24 hours ending 10/10/18 0759  Labs/Imaging Results for orders placed or performed during the hospital encounter of 10/09/18 (from the past 48 hour(s))  Basic metabolic panel     Status: Abnormal   Collection Time: 10/09/18  5:25 PM  Result Value Ref Range   Sodium 140 135 - 145 mmol/L   Potassium 3.6 3.5 - 5.1 mmol/L   Chloride 95 (L) 98 - 111 mmol/L   CO2 29 22 - 32 mmol/L   Glucose, Bld 273 (H) 70 - 99 mg/dL   BUN 36 (H) 6 - 20 mg/dL   Creatinine, Ser 7.75 (H) 0.44 - 1.00 mg/dL   Calcium 9.1 8.9 - 10.3 mg/dL   GFR calc  non Af Amer 5 (L) >60 mL/min   GFR calc Af Amer 6 (L) >60 mL/min   Anion gap 16 (H) 5 - 15    Comment: Performed at [facility], [address] [place], [place] [address]  CBC with Differential     Status: Abnormal   Collection Time: 10/09/18  5:25 PM  Result Value Ref Range   WBC 7.7 4.0 - 10.5 K/uL   RBC 2.25 (L) 3.87 - 5.11 MIL/uL   Hemoglobin 7.0 (L) 12.0 - 15.0 g/dL    Comment: REPEATED TO VERIFY   HCT 22.7 (L) 36.0 - 46.0 %   MCV 100.9 (H) 80.0 - 100.0 fL   MCH 31.1 26.0 - 34.0 pg   MCHC 30.8 30.0 - 36.0 g/dL   RDW 17.2 (H) 11.5 - 15.5 %   Platelets 192 150 - 400 K/uL   nRBC 0.0 0.0 - 0.2 %   Neutrophils Relative % 72 %   Neutro Abs 5.6 1.7 - 7.7 K/uL   Lymphocytes Relative 16 %   Lymphs Abs 1.2 0.7 - 4.0 K/uL   Monocytes Relative 7 %   Monocytes Absolute 0.5 0.1 - 1.0 K/uL   Eosinophils Relative 4 %   Eosinophils Absolute 0.3 0.0 - 0.5 K/uL   Basophils Relative 0 %   Basophils Absolute 0.0 0.0 - 0.1 K/uL   Immature Granulocytes 1 %   Abs Immature Granulocytes 0.04 0.00 - 0.07 K/uL    Comment: Performed at [facility] [address], [place], [place] [address]  Troponin I (High Sensitivity)     Status: Abnormal   Collection Time: 10/09/18  5:25 PM  Result Value Ref Range   Troponin I (High Sensitivity) 38 (H) <18 ng/L    Comment: (NOTE) Elevated high sensitivity troponin I (hsTnI) values and significant  changes across serial measurements may suggest ACS but many other  chronic and acute conditions are known to elevate hsTnI results.  Refer to the "Links" section for chest pain algorithms and additional  guidance. Performed at [facility], [place] [address], [place],  [address]   POCT I-Stat EG7     Status: Abnormal   Collection Time: 10/09/18  5:48 PM  Result Value Ref Range   pH, Ven 7.455 (H) 7.250 - 7.430   pCO2, Ven 40.7 (L) 44.0 - 60.0 mmHg   pO2,  Ven 43.0 32.0 - 45.0 mmHg   Bicarbonate 28.6 (H) 20.0 - 28.0 mmol/L   TCO2 30 22  - 32 mmol/L   O2 Saturation 81.0 %   Acid-Base Excess 4.0 (H) 0.0 - 2.0 mmol/L   Sodium 139 135 - 145 mmol/L   Potassium 3.5 3.5 - 5.1 mmol/L   Calcium, Ion 1.09 (L) 1.15 - 1.40 mmol/L   HCT 23.0 (L) 36.0 - 46.0 %   Hemoglobin 7.8 (L) 12.0 - 15.0 g/dL   Patient temperature HIDE    Sample type VENOUS   SARS Coronavirus 2 [facility] order, Performed in [facility] hospital lab) Nasopharyngeal Nasopharyngeal Swab     Status: Abnormal   Collection Time: 10/09/18  7:06 PM   Specimen: Nasopharyngeal Swab  Result Value Ref Range   SARS Coronavirus 2 POSITIVE (A) NEGATIVE    Comment: RESULT CALLED TO, READ BACK BY AND VERIFIED WITH: RN S CRUZ @2150  10/09/18 BY S GEZAHEGN (NOTE) If result is NEGATIVE SARS-CoV-2 target nucleic acids are NOT DETECTED. The SARS-CoV-2 RNA is generally detectable in upper and lower  respiratory specimens during the acute phase of infection. The lowest  concentration of SARS-CoV-2 viral copies this assay can detect is 250  copies / mL. A negative result does not preclude SARS-CoV-2 infection  and should not be used as the sole basis for treatment or other  patient management decisions.  A negative result may occur with  improper specimen collection / handling, submission of specimen other  than nasopharyngeal swab, presence of viral mutation(s) within the  areas targeted by this assay, and inadequate number of viral copies  (<250 copies / mL). A negative result must be combined with clinical  observations, patient history, and epidemiological information. If result is POSITIVE SARS-CoV-2 target nucleic acids are DETECTED.  The SARS-CoV-2 RNA is generally detectable in upper and lower  respiratory specimens during the acute phase of infection.  Positive  results are indicative of active infection with SARS-CoV-2.  Clinical  correlation with patient history and other diagnostic information is  necessary to determine patient infection status.  Positive results do   not rule out bacterial infection or co-infection with other viruses. If result is PRESUMPTIVE POSTIVE SARS-CoV-2 nucleic acids MAY BE PRESENT.   A presumptive positive result was obtained on the submitted specimen  and confirmed on repeat testing.  While 2019 novel coronavirus  (SARS-CoV-2) nucleic acids may be present in the submitted sample  additional confirmatory testing may be necessary for epidemiological  and / or clinical management purposes  to differentiate between  SARS-CoV-2 and other Sarbecovirus currently known to infect humans.  If clinically indicated additional testing with an alternate test  methodology [id number]) i s advised. The SARS-CoV-2 RNA is generally  detectable in upper and lower respiratory specimens during the acute  phase of infection. The expected result is Negative. Fact Sheet for Patients:  StrictlyIdeas.no Fact Sheet for Healthcare Providers: BankingDealers.co.za This test is not yet approved or cleared by the [place] FDA and has been authorized for detection and/or diagnosis of SARS-CoV-2 by FDA under an Emergency Use Authorization (EUA).  This EUA will remain in effect (meaning this test can be used) for the duration of the COVID-19 declaration under Section 564(b)(1) of the Act, 21 U.S.C. section 360bbb-3(b)(1), unless the authorization is terminated or revoked sooner. Performed at [facility], [place] [address] [place], [place] [address]   CBG monitoring, ED     Status: Abnormal   Collection Time: 10/09/18  10:56 PM  Result Value Ref Range   Glucose-Capillary 144 (H) 70 - 99 mg/dL  Ammonia     Status: None   Collection Time: 10/10/18 12:03 AM  Result Value Ref Range   Ammonia 28 9 - 35 umol/L    Comment: Performed at [facility], [place] [address], [place], [place] [address]  Troponin I (High Sensitivity)     Status: Abnormal   Collection Time: 10/10/18 12:03 AM  Result Value Ref  Range   Troponin I (High Sensitivity) 39 (H) <18 ng/L    Comment: (NOTE) Elevated high sensitivity troponin I (hsTnI) values and significant  changes across serial measurements may suggest ACS but many other  chronic and acute conditions are known to elevate hsTnI results.  Refer to the Links section for chest pain algorithms and additional  guidance. Performed at [facility], [place] [address] [place], [place] [address]   Hepatic function panel     Status: Abnormal   Collection Time: 10/10/18 12:03 AM  Result Value Ref Range   Total Protein 7.4 6.5 - 8.1 g/dL   Albumin 2.9 (L) 3.5 - 5.0 g/dL   AST 15 15 - 41 U/L   ALT 11 0 - 44 U/L   Alkaline Phosphatase 82 38 - 126 U/L   Total Bilirubin 0.9 0.3 - 1.2 mg/dL   Bilirubin, Direct <0.1 0.0 - 0.2 mg/dL   Indirect Bilirubin NOT CALCULATED 0.3 - 0.9 mg/dL    Comment: Performed at [facility] [address] [place], [place] [address]  CBC     Status: Abnormal   Collection Time: 10/10/18 12:03 AM  Result Value Ref Range   WBC 8.7 4.0 - 10.5 K/uL   RBC 2.32 (L) 3.87 - 5.11 MIL/uL   Hemoglobin 7.2 (L) 12.0 - 15.0 g/dL   HCT 23.9 (L) 36.0 - 46.0 %   MCV 103.0 (H) 80.0 - 100.0 fL   MCH 31.0 26.0 - 34.0 pg   MCHC 30.1 30.0 - 36.0 g/dL   RDW 17.3 (H) 11.5 - 15.5 %   Platelets 202 150 - 400 K/uL   nRBC 0.0 0.0 - 0.2 %    Comment: Performed at [facility], [place] [address] [place], [place] [address]  Creatinine, serum     Status: Abnormal   Collection Time: 10/10/18 12:03 AM  Result Value Ref Range   Creatinine, Ser 8.31 (H) 0.44 - 1.00 mg/dL   GFR calc non Af Amer 5 (L) >60 mL/min   GFR calc Af Amer 6 (L) >60 mL/min    Comment: Performed at [facility] [address] [place],  [address]  CBG monitoring, ED     Status: Abnormal   Collection Time: 10/10/18 12:08 AM  Result Value Ref Range   Glucose-Capillary 126 (H) 70 - 99 mg/dL   Comment 1 Notify RN    Comment 2 Document in Chart   Troponin I  (High Sensitivity)     Status: Abnormal   Collection Time: 10/10/18  2:53 AM  Result Value Ref Range   Troponin I (High Sensitivity) 40 (H) <18 ng/L    Comment: (NOTE) Elevated high sensitivity troponin I (hsTnI) values and significant  changes across serial measurements may suggest ACS but many other  chronic and acute conditions are known to elevate hsTnI results.  Refer to the "Links" section for chest pain algorithms and additional  guidance. Performed at [facility], [place]  [address] [place], [place] [address]   Procalcitonin - Baseline     Status: None   Collection Time: 10/10/18  2:53 AM  Result Value Ref Range   Procalcitonin 0.25 ng/mL    Comment:        Interpretation: PCT (Procalcitonin) <= 0.5 ng/mL: Systemic infection (sepsis) is not likely. Local bacterial infection is possible. (NOTE)       Sepsis PCT Algorithm           Lower Respiratory Tract                                      Infection PCT Algorithm    ----------------------------     ----------------------------         PCT < 0.25 ng/mL                PCT < 0.10 ng/mL         Strongly encourage             Strongly discourage   discontinuation of antibiotics    initiation of antibiotics    ----------------------------     -----------------------------       PCT 0.25 - 0.50 ng/mL            PCT 0.10 - 0.25 ng/mL               OR       >80% decrease in PCT            Discourage initiation of                                            antibiotics      Encourage discontinuation           of antibiotics    ----------------------------     -----------------------------         PCT >= 0.50 ng/mL              PCT 0.26 - 0.50 ng/mL               AND        <80% decrease in PCT             Encourage initiation of                                             antibiotics       Encourage continuation           of antibiotics    ----------------------------     -----------------------------        PCT >= 0.50  ng/mL                  PCT > 0.50 ng/mL               AND         increase in PCT                  Strongly encourage  initiation of antibiotics    Strongly encourage escalation           of antibiotics                                     -----------------------------                                           PCT <= 0.25 ng/mL                                                 OR                                        > 80% decrease in PCT                                     Discontinue / Do not initiate                                             antibiotics Performed at [facility], [address], [place], [place] [address]   C-reactive protein     Status: Abnormal   Collection Time: 10/10/18  3:01 AM  Result Value Ref Range   CRP 6.7 (H) <1.0 mg/dL    Comment: Performed at [facility] [address] [place], [place] [address]  Ferritin     Status: Abnormal   Collection Time: 10/10/18  3:01 AM  Result Value Ref Range   Ferritin 1,132 (H) 11 - 307 ng/mL    Comment: Performed at [facility], [place] [address] [place],  [address]   Dg Chest Portable 1 View  Result Date: 10/09/2018 CLINICAL DATA:  chest pressure, starting about 2 hours ago. Pt states she was treated for pneumonia but still has cough and congestion.Was admitted [facility] with covid within the past 2 weeks. EXAM: PORTABLE CHEST 1 VIEW COMPARISON:  Chest radiograph 09/27/2018 FINDINGS: Stable cardiomediastinal contours with enlarged heart size. Right central venous catheter tip projects over the right atrium. Central venous congestion. There are persistent predominantly lower lobe heterogeneous opacities. No pneumothorax or large pleural effusion. No acute abnormality in the visualized skeleton. IMPRESSION: Persistent bilateral lower lung opacities could represent infection and/or edema. Electronically Signed   By: Audie Pinto M.D.   On: 10/09/2018 18:05     Pending Labs Unresulted Labs (From admission, onward)   None      Vitals/Pain Today's Vitals   10/10/18 0600 10/10/18 0700 10/10/18 0757 10/10/18 0757  BP: (!) 168/88 (!) 154/100  (!) 111/98  Pulse:    71  Resp: 19 19  (!) 24  Temp:      TempSrc:      SpO2:    100%  Weight:      Height:      PainSc:  0-No pain     Isolation Precautions Airborne and Contact precautions  Medications Medications  acetaminophen (TYLENOL) tablet 650 mg (has no administration in time range)    Or  acetaminophen (TYLENOL) suppository 650 mg (has no administration in time range)  ondansetron (ZOFRAN) tablet 4 mg (has no administration in time range)    Or  ondansetron (ZOFRAN) injection 4 mg (has no administration in time range)  heparin injection 5,000 Units (5,000 Units Subcutaneous Given 10/10/18 0726)  insulin aspart (novoLOG) injection 0-9 Units (has no administration in time range)  0.9 %  sodium chloride infusion (has no administration in time range)  0.9 %  sodium chloride infusion (has no administration in time range)  alteplase (CATHFLO ACTIVASE) injection 2 mg (has no administration in time range)  heparin injection 1,000 Units (has no administration in time range)  lidocaine (PF) (XYLOCAINE) 1 % injection 5 mL (has no administration in time range)  lidocaine-prilocaine (EMLA) cream 1 application (has no administration in time range)  pentafluoroprop-tetrafluoroeth (GEBAUERS) aerosol 1 application (has no administration in time range)  vancomycin (VANCOCIN) IVPB 1000 mg/200 mL premix (has no administration in time range)  ceFEPIme (MAXIPIME) 2 g in sodium chloride 0.9 % 100 mL IVPB (has no administration in time range)  methylPREDNISolone sodium succinate (SOLU-MEDROL) 40 mg/mL injection 40 mg (40 mg Intravenous Given 10/10/18 0600)  insulin aspart protamine- aspart (NOVOLOG MIX 70/30) injection 25 Units (has no administration in time range)  metoprolol succinate (TOPROL-XL) 24 hr  tablet 25 mg (has no administration in time range)  atorvastatin (LIPITOR) tablet 80 mg (has no administration in time range)  aspirin EC tablet 81 mg (has no administration in time range)  etomidate (AMIDATE) injection (20 mg Intravenous Canceled Entry 10/09/18 1653)  succinylcholine (ANECTINE) injection (150 mg Intravenous Canceled Entry 10/09/18 1654)  naloxone (NARCAN) injection 0.4 mg (0.4 mg Intravenous Given 10/09/18 2020)  ceFEPIme (MAXIPIME) 2 g in sodium chloride 0.9 % 100 mL IVPB (0 g Intravenous Stopped 10/10/18 0545)  vancomycin (VANCOCIN) 2,000 mg in sodium chloride 0.9 % 500 mL IVPB (2,000 mg Intravenous New Bag/Given 10/10/18 0545)    Mobility walks with device Moderate fall risk   Focused Assessments Pulmonary Assessment Handoff:  Lung sounds: Bilateral Breath Sounds: Clear, Diminished L Breath Sounds: Rhonchi R Breath Sounds: Rhonchi O2 Device: Nasal Cannula O2 Flow Rate (L/min): 6 L/min      R Recommendations: See Admitting Provider Note  Report given to:   Additional Notes: Positive covid,

## 2018-10-10 NOTE — Consult Note (Signed)
[facility] Renal Consultation Note    Indication for Consultation:  Management of ESRD/hemodialysis; anemia, hypertension/volume and secondary hyperparathyroidism PCP: Sandi Mariscal, MD    HPI: [name] is a 53 y.o. female with ESRD, DM, HTN, nonobstructive CAD, anemia hx COVID + status since found incidentally on screening process 3 weeks ago and who remains COVID + .  [facility] MWF [facility]  Last dialysis was 9/4.Marland Kitchen She had a low grade temp pre HD.  Net UF was 1.9 with post wt 107.5 (EDW 108. She had a recent admission 8/25-8/26 for ligation of AVF and TDC.  EDW lowered at that time to 108 with instructions to lower further due to pul edema seen on CXR.    She presented today with CP and SOB and told she was supposed to come to get a blood transfusion.  Last outpatient hgbs were 6.6 8/31 and 9/2. Fe stores adequate. Hgb here today is 7.2    Past Medical History:  Diagnosis Date  . Anemia   . Arthritis    "knees"  . CAD (coronary artery disease)    Nonobstructive on CT 2019  . ESRD (end stage renal disease) ([place])    Dialysis M/W/F  . Headache(784.0)   . High cholesterol   . Hypertension   . 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 Pericardial effusion   . Pneumonia    x 2   . PVD (peripheral vascular disease) ([place])    Right leg stent in [place].  (No records)  . Restless legs   . Sleep apnea    does not use Cpap  . Type II diabetes mellitus ([place])    Past Surgical History:  Procedure Laterality Date  . AV FISTULA PLACEMENT Left   . [place]; 1997   x 2  . COLONOSCOPY    . EYE SURGERY Bilateral    cataract surgery  . INSERTION OF DIALYSIS CATHETER Right 09/26/2018   Procedure: INSERTION OF DIALYSIS CATHETER Right Internal Jugular.;  Surgeon: Elam Dutch, MD;  Location: [place];  Service: Vascular;  Laterality: Right;  . LIGATION OF ARTERIOVENOUS  FISTULA Left 09/26/2018   Procedure: LIGATION OF  ARTERIOVENOUS  FISTULA LEFT ARM;  Surgeon: Elam Dutch, MD;  Location: [place];  Service: Vascular;  Laterality: Left;  . PERICARDIAL WINDOW  02/2004   for pericardial effusion  . TUBAL LIGATION  05/1995   Family History  Problem Relation Age of Onset  . Cancer Brother   . Heart disease Father        Died age 78  . Diabetes Father   . Hyperlipidemia Father   . Hypertension Father   . Stroke Father   . Diabetes Mother   . Hypertension Mother   . Stroke Mother   . Dementia Mother   . Diabetes Sister   . Diabetes Brother   . Hypertension Sister   . Hypertension Brother    Social History:  reports that she has been smoking cigarettes. She has a 10.00 pack-year smoking history. She has never used smokeless tobacco. She reports that she does not drink alcohol or use drugs. No Known Allergies Prior to Admission medications   Medication Sig Start Date End Date Taking? Authorizing Provider  atorvastatin (LIPITOR) 80 MG tablet Take 1 tablet (80 mg total) by mouth daily. Take 1/2 tab daily for one week, then increase to the full tab. Patient taking differently: Take  80 mg by mouth daily.  06/01/17  Yes Tereasa Coop, PA-C  AURYXIA 1 GM 210 MG(Fe) tablet Take 630 mg by mouth 3 (three) times daily with meals.  05/29/18  Yes [provider]  b complex-vitamin c-folic acid (NEPHRO-VITE) 0.8 MG TABS tablet Take 1 tablet by mouth Every Tuesday,Thursday,and Saturday with dialysis.  07/17/18  Yes [provider]  diphenoxylate-atropine (LOMOTIL) 2.5-0.025 MG tablet Take 1 tablet by mouth 4 (four) times daily as needed for diarrhea or loose stools. 09/06/17  Yes Kirichenko, Tatyana, PA-C  hydrOXYzine (ATARAX/VISTARIL) 25 MG tablet Take 25 mg by mouth daily.  05/12/17  Yes [provider]  Insulin Lispro Prot & Lispro (HUMALOG MIX 75/25 KWIKPEN) (75-25) 100 UNIT/ML Kwikpen Inject 25-30 Units into the skin 2 (two) times daily with a meal.    Yes [provider]   metoprolol succinate (TOPROL-XL) 25 MG 24 hr tablet Take 1 tablet (25 mg total) by mouth daily. Patient taking differently: Take 100 mg by mouth daily.  06/01/17  Yes Tereasa Coop, PA-C  oxyCODONE (ROXICODONE) 15 MG immediate release tablet Take 1 tablet (15 mg total) by mouth daily as needed for pain. 09/20/18  Yes Mariel Aloe, MD  pramipexole (MIRAPEX) 0.5 MG tablet Take 0.5 mg by mouth daily.  05/18/17  Yes [provider]  vitamin C (VITAMIN C) 500 MG tablet Take 1 tablet (500 mg total) by mouth daily. 09/21/18  Yes Mariel Aloe, MD  zinc sulfate 220 (50 Zn) MG capsule Take 1 capsule (220 mg total) by mouth daily. 09/21/18  Yes Mariel Aloe, MD  benzonatate (TESSALON) 200 MG capsule Take 1 capsule (200 mg total) by mouth 3 (three) times daily as needed for cough. 09/27/18   Sheikh, Omair Latif, DO  ENBREL 50 MG/ML injection Inject 50 mg into the skin once a week.  07/06/18   [provider]   Current Facility-Administered Medications  Medication Dose Route Frequency Provider Last Rate Last Dose  . 0.9 %  sodium chloride infusion  100 mL Intravenous PRN Pearson Grippe B, MD      . 0.9 %  sodium chloride infusion  100 mL Intravenous PRN Pearson Grippe B, MD      . acetaminophen (TYLENOL) tablet 650 mg  650 mg Oral Q6H PRN Rise Patience, MD       Or  . acetaminophen (TYLENOL) suppository 650 mg  650 mg Rectal Q6H PRN Rise Patience, MD      . aspirin EC tablet 81 mg  81 mg Oral Daily Rise Patience, MD   81 mg at 10/10/18 1028  . atorvastatin (LIPITOR) tablet 80 mg  80 mg Oral q1800 Rise Patience, MD      . calcitRIOL (ROCALTROL) capsule 1.5 mcg  1.5 mcg Oral Q T,Th,Sa-HD Alric Seton, PA-C      . [START ON 10/11/2018] ceFEPIme (MAXIPIME) 2 g in sodium chloride 0.9 % 100 mL IVPB  2 g Intravenous Q M,W,F-2000 Laren Everts, RPH      . Chlorhexidine Gluconate Cloth 2 % PADS 6 each  6 each Topical Q0600 Alric Seton, PA-C      . [START ON  10/14/2018] Darbepoetin Alfa (ARANESP) injection 200 mcg  200 mcg Intravenous Q Sat-HD Alric Seton, PA-C      . heparin 1000 UNIT/ML injection           . heparin injection 5,000 Units  5,000 Units Subcutaneous Q8H Rise Patience, MD  5,000 Units at 10/10/18 0726  . insulin aspart (novoLOG) injection 0-9 Units  0-9 Units Subcutaneous TID WC Rise Patience, MD   2 Units at 10/10/18 1029  . insulin aspart protamine- aspart (NOVOLOG MIX 70/30) injection 25 Units  25 Units Subcutaneous BID WC Rise Patience, MD   25 Units at 10/10/18 1142  . methylPREDNISolone sodium succinate (SOLU-MEDROL) 40 mg/mL injection 40 mg  40 mg Intravenous Q12H Rise Patience, MD   40 mg at 10/10/18 0600  . metoprolol succinate (TOPROL-XL) 24 hr tablet 25 mg  25 mg Oral Daily Rise Patience, MD   25 mg at 10/10/18 1028  . ondansetron (ZOFRAN) tablet 4 mg  4 mg Oral Q6H PRN Rise Patience, MD       Or  . ondansetron [facility]) injection 4 mg  4 mg Intravenous Q6H PRN Rise Patience, MD      . Derrill Memo ON 10/11/2018] vancomycin (VANCOCIN) IVPB 1000 mg/200 mL premix  1,000 mg Intravenous Q M,W,F-HD Laren Everts, [facility]       Labs: Basic Metabolic Panel: Recent Labs  Lab 10/09/18 1725 10/09/18 1748 10/10/18 0003  NA 140 139  --   K 3.6 3.5  --   CL 95*  --   --   CO2 29  --   --   GLUCOSE 273*  --   --   BUN 36*  --   --   CREATININE 7.75*  --  8.31*  CALCIUM 9.1  --   --    Liver Function Tests: Recent Labs  Lab 10/10/18 0003  AST 15  ALT 11  ALKPHOS 82  BILITOT 0.9  PROT 7.4  ALBUMIN 2.9*   No results for input(s): LIPASE, AMYLASE in the last 168 hours. Recent Labs  Lab 10/10/18 0003  AMMONIA 28   CBC: Recent Labs  Lab 10/09/18 1725 10/09/18 1748 10/10/18 0003  WBC 7.7  --  8.7  NEUTROABS 5.6  --   --   HGB 7.0* 7.8* 7.2*  HCT 22.7* 23.0* 23.9*  MCV 100.9*  --  103.0*  PLT 192  --  202   Cardiac Enzymes: No results for input(s): CKTOTAL, CKMB,  CKMBINDEX, TROPONINI in the last 168 hours. CBG: Recent Labs  Lab 10/09/18 2256 10/10/18 0008 10/10/18 1022  GLUCAP 144* 126* 178*   Iron Studies:  Recent Labs    10/10/18 0301  FERRITIN 1,132*   Studies/Results: Dg Chest Portable 1 View  Result Date: 10/09/2018 CLINICAL DATA:  chest pressure, starting about 2 hours ago. Pt states she was treated for pneumonia but still has cough and congestion.Was admitted at [facility] with covid within the past 2 weeks. EXAM: PORTABLE CHEST 1 VIEW COMPARISON:  Chest radiograph 09/27/2018 FINDINGS: Stable cardiomediastinal contours with enlarged heart size. Right central venous catheter tip projects over the right atrium. Central venous congestion. There are persistent predominantly lower lobe heterogeneous opacities. No pneumothorax or large pleural effusion. No acute abnormality in the visualized skeleton. IMPRESSION: Persistent bilateral lower lung opacities could represent infection and/or edema. Electronically Signed   By: Audie Pinto M.D.   On: 10/09/2018 18:05    ROS: As per HPI otherwise negative.  Physical Exam: Vitals:   10/10/18 0700 10/10/18 0757 10/10/18 0952 10/10/18 1033  BP: (!) 154/100 (!) 111/98 (!) 161/88   Pulse:  71    Resp: 19 (!) 24 20   Temp:   98.2 F (36.8 C)   TempSrc:   Oral  SpO2:  100% 100%   Weight:    109.6 kg  Height:    5\' 8"  (1.727 m)    I did not personally examine patient in light ov COVID risk mitigation but did personally speak to RN and dialysis RN and reviewed notes extensively. She is currently on dialysis - running abbreviated treatment of 3 hr due to COVID status. Plan aggressive UF as BP allows.   Dialysis Access: [facility] placed 8/25  Dialysis Orders:  [facility]S [facility] 4 hr EDW 108 - was to have been lowered more after last d/c 8/26  450/800 2K 2 Ca heparin 10K parsabiv 2.5 calcitriol 1.5 Mircera 225 8/28 last dose Recent hgb 6.6 8/28 and 9/2   Assessment/Plan: 1. SOB - likely aggrevated by   volume and anemia -  2. COVID 19 PNA - persistent cough - per primary 3. ESRD -  TTS - HD today - separate due to COVID status K 3.5 - use added K bath 4. Hypertension/volume  - BP up  - needs volume down - lower edw while here. 5. Anemia  - hgb 7.2 actually up from last two outpatient hgb of 6.6 8/31 and 9/2 - continue ESA - transfuse prn hgb <7  6. Metabolic bone disease -  Continue calcitriol  7. Nutrition - renal carb mod diet/vits/nepro - suspect unintentional weight loss related to COVID 8. DM 9. Nonobstructive CAD 10. Gangrene left finger - s/p ligation of AVF 8/25 due to steal  Catherine Jacobson, PA-C [place] [phone] 10/10/2018, 11:51 AM

## 2018-10-10 NOTE — Plan of Care (Signed)

## 2018-10-10 NOTE — Progress Notes (Signed)
Requested primary nurse to retrieve  Vancomycin dose, nurse states dose has not arrived from pharmacy remaining treatment time 1h 73min. Nurse made aware she will need to administer if not received

## 2018-10-10 NOTE — ED Notes (Signed)
[name] [id number]

## 2018-10-10 NOTE — Progress Notes (Addendum)
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    A & P  Acute encephalopathy thought secondary to pain medication gabapentin.  Similar presentations last month x2. --Resolved.  Acute hypoxic respiratory failure, possible superimposed pneumonia --Suspect secondary to volume overload but procalcitonin is equivocal and chest x-ray findings are probably related to previous coinfection but cannot rule out pneumonia at this point..   --Will continue empiric antibiotics --Hemodialysis per nephrology.  Status post COVID-19 infection, discharged 8/9, treated with Decadron. No symptoms developed.  COVID +9/7. --No further evaluation suggested.  Ferritin 1132 CRP 6.7 Transaminases within normal limits  Elevated high-sensitivity troponin, flat. --38, 39, 40; lower than August value.  Secondary to ESRD.  EKG nonacute, showed chronic right bundle blanch block.  No evidence of ACS.  No further evaluation suggested.  ESRD, anemia of ESRD --Hemodialysis per nephrology. --Baseline hemoglobin appears to be about 8-9.  Hemoglobin stable here since August 17. --We will check CBC in a.m.  Transfuse if less than 7 or for bleeding.  Diabetes mellitus type 2 --CBG stable.  Continue NovoLog Mix, sliding scale insulin.  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  Followed by vascular surgery previously.  Had recent ligation AV fistula for steal phenomenon --Keep follow-up with hand surgery as an outpatient.  Essential hypertension --Stable.  Continue metoprolol.   Resolved Hospital Problem list       DVT prophylaxis: heparin Code Status: Full Family Communication: daughter by  telephone Disposition Plan: home    Murray Hodgkins, MD  [facility] Direct contact: see [email or web address] (further directions at bottom of note if needed) 7PM-7AM contact night coverage as at bottom of note 10/10/2018, 3:52 PM  LOS: 1 day   Significant Hospital Events   . 8/27 discharge by vascular surgery after having ligation of AV fistula left arm for steal phenomenon, left thumb gangrene.   . 9/7 presented with chest pain shortness of breath.  Lethargic in the emergency department requiring Narcan.   Consults:  . Nephrology   Procedures:  . Hemodialysis  Significant Diagnostic Tests:  . 9/7 chest x-ray > persistent bilateral lower lung opacities could represent infection or edema.  Also seen 8/26, 825   Micro Data:  . 9/7 COVID positive   Antimicrobials:  . Cefepime 9/8 > . Vancomycin 9/8 >  Interval History/Subjective  Feels short of breath, some cough.  Otherwise seems to feel okay.  Objective   Vitals:  Vitals:   10/10/18 1430 10/10/18 1502  BP: 133/79 122/77  Pulse: 73 69  Resp:    Temp:  98.1 F (36.7 C)  SpO2:      Exam:  Constitutional:  . Appears calm and comfortable Eyes:  . Grossly normal ENMT:  . grossly normal hearing  . Lips appear normal Respiratory:  . CTA bilaterally, no w/r/r.  . Respiratory effort mild to moderate increase. Cardiovascular:  . RRR, no m/r/g . 2+ bilateral LE extremity edema   Psychiatric:  . Mental status o Mood, affect appropriate  I have personally reviewed the following:   Today's Data  . CBG stable . Potassium 5.1, remainder BMP consistent with end-stage renal disease. Marland Kitchen Hemoglobin 7.1 platelets 190.  WBC 10.1. Marland Kitchen Ferritin 1132, CRP 6.7, pro calcitonin 0.25.  Scheduled Meds: . aspirin EC  81 mg Oral Daily  . atorvastatin  80 mg Oral q1800  . calcitRIOL  1.5 mcg Oral Q T,Th,Sa-HD  . Chlorhexidine Gluconate Cloth  6 each Topical Q0600  . [START ON 10/14/2018] darbepoetin (ARANESP) injection - DIALYSIS   200 mcg Intravenous Q Sat-HD  . feeding supplement (NEPRO CARB STEADY)  237 mL Oral BID BM  . heparin  5,000 Units Subcutaneous Q8H  . insulin aspart  0-9 Units Subcutaneous TID WC  . insulin aspart protamine- aspart  25 Units Subcutaneous BID WC  . metoprolol succinate  25 mg Oral Daily  . multivitamin  1 tablet Oral QHS   Continuous Infusions: . sodium chloride    . sodium chloride    . sodium chloride    . sodium chloride    . [START ON 10/11/2018] ceFEPime (MAXIPIME) IV    . vancomycin 1,000 mg (10/10/18 1536)    Principal Problem:   Acute respiratory failure with hypoxia (HCC) Active Problems:   Type II diabetes mellitus with complication, uncontrolled ([place])   ESRD on dialysis ([place])   COVID-19 virus infection   Gangrene ([place])   Acute encephalopathy   Pneumonia   COVID-19 virus detected   LOS: 1 day   How to contact the [facility] Attending or Consulting provider 7A - 7P or covering provider during after hours [place], for this patient?  1. Check the care team in [facility] and look for a) attending/consulting TRH provider listed and b) the [facility] team listed 2. Log into [email or web address] and use [place]'s universal password to access. If you do not have the password, please contact the hospital operator. 3. Locate the [facility] provider you are looking for under [facility] and page to a number that you can be directly reached. 4. If you still have difficulty reaching the provider, please page the [facility] (Director on Call) for the Hospitalists listed on amion for assistance.

## 2018-10-10 NOTE — Progress Notes (Signed)
Pharmacy Antibiotic Note  [name] is a 53 y.o. female admitted on 10/09/2018 with pneumonia.  Pharmacy has been consulted for vancomycin and cefepime dosing.  Plan: Vancomyin 2000mg  IV x1 followed by 1000mg  IV during each HD. Cefepime 2g IV now and after each HD.  Height: 5\' 8"  (172.7 cm) Weight: 240 lb (108.9 kg) IBW/kg (Calculated) : 63.9  Temp (24hrs), Avg:98.4 F (36.9 C), Min:98.4 F (36.9 C), Max:98.4 F (36.9 C)  Recent Labs  Lab 10/09/18 1725 10/10/18 0003  WBC 7.7 8.7  CREATININE 7.75* 8.31*    Estimated Creatinine Clearance: 10 mL/min (A) (by C-G formula based on SCr of 8.31 mg/dL (H)).    No Known Allergies   Thank you for allowing pharmacy to be a part of this patient's care.  Wynona Neat, PharmD, BCPS  10/10/2018 3:41 AM

## 2018-10-11 ENCOUNTER — Inpatient Hospital Stay (HOSPITAL_COMMUNITY): Payer: Medicare Other

## 2018-10-11 DIAGNOSIS — I96 Gangrene, not elsewhere classified: Secondary | ICD-10-CM

## 2018-10-11 DIAGNOSIS — E46 Unspecified protein-calorie malnutrition: Secondary | ICD-10-CM | POA: Insufficient documentation

## 2018-10-11 LAB — CBC
HCT: 20 % — ABNORMAL LOW (ref 36.0–46.0)
Hemoglobin: 6.1 g/dL — CL (ref 12.0–15.0)
MCH: 30.5 pg (ref 26.0–34.0)
MCHC: 30.5 g/dL (ref 30.0–36.0)
MCV: 100 fL (ref 80.0–100.0)
Platelets: 202 10*3/uL (ref 150–400)
RBC: 2 MIL/uL — ABNORMAL LOW (ref 3.87–5.11)
RDW: 16.9 % — ABNORMAL HIGH (ref 11.5–15.5)
WBC: 8.6 10*3/uL (ref 4.0–10.5)
nRBC: 0 % (ref 0.0–0.2)

## 2018-10-11 LAB — GLUCOSE, CAPILLARY
Glucose-Capillary: 279 mg/dL — ABNORMAL HIGH (ref 70–99)
Glucose-Capillary: 274 mg/dL — ABNORMAL HIGH (ref 70–99)
Glucose-Capillary: 233 mg/dL — ABNORMAL HIGH (ref 70–99)
Glucose-Capillary: 231 mg/dL — ABNORMAL HIGH (ref 70–99)

## 2018-10-11 LAB — PREPARE RBC (CROSSMATCH)

## 2018-10-11 MED ORDER — SODIUM CHLORIDE 0.9% IV SOLUTION: Freq: Once | INTRAVENOUS | Status: DC

## 2018-10-11 MED ORDER — SODIUM ZIRCONIUM CYCLOSILICATE 10 G PO PACK
10.0000 g | PACK | Freq: Once | ORAL | Status: AC
  Administered 2018-10-11: 10 g via ORAL

## 2018-10-11 MED ORDER — PHENYLEPHRINE HCL-NACL 10-0.9 MG/250ML-% IV SOLN
INTRAVENOUS | Status: AC
  Filled 2018-10-11: qty 250

## 2018-10-11 MED ORDER — HEPARIN SODIUM (PORCINE) 5000 UNIT/ML IJ SOLN
7500.0000 [IU] | Freq: Three times a day (TID) | INTRAMUSCULAR | Status: DC
  Administered 2018-10-11 – 2018-10-14 (×9): 7500 [IU] via SUBCUTANEOUS
  Filled 2018-10-11 (×10): qty 2

## 2018-10-11 NOTE — Progress Notes (Signed)
Patient's OP HD treatment schedule is TTS 12:00pm. She needs to arrive at 11:45am. If she is ready for discharge tomorrow, 10/12/18, she can receive her HD at her outpatient clinic/[facility].  Renal Navigator can assist and support as needed.  Alphonzo Cruise, [place] Renal Navigator  [phone]

## 2018-10-11 NOTE — Progress Notes (Signed)
PROGRESS NOTE    [name]  CXK:[id number] DOB: 11/13/1965 DOA: 10/09/2018 PCP: Sandi Mariscal, MD    Brief Narrative:  53 year old female who presented with chest pain and dyspnea.  She does have significant past medical history for end-stage renal disease on hemodialysis (TTS), type II that is mellitus, hypertension chronic anemia.  Recent hospitalization August 17 for COVID-19 pneumonia.  Subsequent rehospitalization due to left thumb ischemic gangrene.  Patient reported chest pain dyspnea for about 2 days, associated with cough.  In the emergency department patient was lethargic, somnolent, received Narcan, her blood pressure was 167/97, heart rate 58, respirate 22, oxygen saturation 99%, his lungs are clear to auscultation bilaterally, heart S1-S2 present rhythm, abdomen soft, no lower extremity edema.  SARS COVID-19 was positive. Chest radiograph with right lower infiltrate, more intense compared to film from 08/26.   Patient was admitted to the hospital with a working diagnosis of metabolic encephalopathy complicated by acute hypoxic respiratory failure, volume overload, possible recurrent right lower lobe pneumonia.    Assessment & Plan:   Principal Problem:   Acute respiratory failure with hypoxia (HCC) Active Problems:   Type II diabetes mellitus with complication, uncontrolled ([place])   ESRD on dialysis ([place])   COVID-19 virus infection   Gangrene ([place])   Acute encephalopathy   Pneumonia   COVID-19 virus detected   1. Acute hypoxic respiratory failure due to volume overload. Patient had HD yesterday with improvement of volume status, but continue to have increase oxygen requirements. Currently oxygen saturation 95% on 2 LPM. Will follow chest film today. Will dc cefepime for now.   2. Metabolic encephalopathy. Her encephalopathy seems to improve, now at baseline, will continue close neuro checks per unit protocol. Avoid sedatives and narcotics.   3. ESRD on HD. Patient had HD  yesterday, will probably have HD in am, likely in the outpatient HD unit.   4. 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 Continue local wound care. Patient sp ligation of AV fistula left arm. Continue asa and statin. C  5. Covid 19 infection with no pneumonia. Patient has recovered from COVID viral pneumonia. Old records personally reviewed, admission from 08/17 to 08/19. He was treated with dexamethasone while hospitalized. Will continue to hold on steroids for now.   6. Acute on chronica anemia due to chronic renal disease. Critical Hgb today down to 6,1, ordered one unit PRBC, will follow with Hgb in am, no signs of acute bleeding. Continue Darbepoetin.   7. T2DM. Patient is tolerating po well, glucose 416 to 274. Will continue insulin regimen with 70/30 along with insulin sliding scale.   8. HTN. Continue metoprolol for blood pressure control.   DVT prophylaxis heparin   Code Status: full Family Communication: no family at the bedside  Disposition Plan/ discharge barriers: possible dc in am.   Body mass index is 36.14 kg/m. Malnutrition Type:      Malnutrition Characteristics:      Nutrition Interventions:     RN Pressure Injury Documentation:     Consultants:   Nephrology   Procedures:     Antimicrobials:       Subjective: Patient with improving dyspnea and cough, no further confusion or agitation, no nausea or vomiting.   Objective: Vitals:   10/11/18 0500 10/11/18 0827 10/11/18 1045 10/11/18 1108  BP:  125/66 (!) 140/57 122/66  Pulse:  70 75 66  Resp:   16   Temp:  97.8 F (36.6 C) 98.1 F (36.7 C) 98.2 F (36.8  C)  TempSrc:  Oral Oral Oral  SpO2:   95%   Weight: 107.8 kg     Height:        Intake/Output Summary (Last 24 hours) at 10/11/2018 1154 Last data filed at 10/10/2018 1700 Gross per 24 hour  Intake 462 ml  Output 3101 ml  Net -2639 ml   Filed Weights   10/10/18 1200 10/10/18 1502 10/11/18 0500  Weight: 111.2 kg 107.5 kg 107.8  kg    Examination:   General: Not in pain or dyspnea, deconditioned  Neurology: Awake and alert, non focal  E ENT: muild pallor, no icterus, oral mucosa moist Cardiovascular: No JVD. S1-S2 present, rhythmic, no gallops, rubs, or murmurs. No lower extremity edema. Pulmonary: positive breath sounds bilaterally, adequate air movement, no wheezing, rhonchi or rales. Gastrointestinal. Abdomen with no organomegaly, non tender, no rebound or guarding Skin. No rashes Musculoskeletal: no joint deformities     Data Reviewed: I have personally reviewed following labs and imaging studies  CBC: Recent Labs  Lab 10/09/18 1725 10/09/18 1748 10/10/18 0003 10/10/18 1230 10/11/18 0449  WBC 7.7  --  8.7 10.1 8.6  NEUTROABS 5.6  --   --   --   --   HGB 7.0* 7.8* 7.2* 7.1* 6.1*  HCT 22.7* 23.0* 23.9* 23.4* 20.0*  MCV 100.9*  --  103.0* 102.2* 100.0  PLT 192  --  202 190 614   Basic Metabolic Panel: Recent Labs  Lab 10/09/18 1725 10/09/18 1748 10/10/18 0003 10/10/18 1230  NA 140 139  --  139  K 3.6 3.5  --  5.1  CL 95*  --   --  95*  CO2 29  --   --  24  GLUCOSE 273*  --   --  274*  BUN 36*  --   --  46*  CREATININE 7.75*  --  8.31* 8.57*  CALCIUM 9.1  --   --  8.7*  PHOS  --   --   --  5.8*   GFR: Estimated Creatinine Clearance: 9.7 mL/min (A) (by C-G formula based on SCr of 8.57 mg/dL (H)). Liver Function Tests: Recent Labs  Lab 10/10/18 0003 10/10/18 1230  AST 15  --   ALT 11  --   ALKPHOS 82  --   BILITOT 0.9  --   PROT 7.4  --   ALBUMIN 2.9* 3.0*   No results for input(s): LIPASE, AMYLASE in the last 168 hours. Recent Labs  Lab 10/10/18 0003  AMMONIA 28   Coagulation Profile: No results for input(s): INR, PROTIME in the last 168 hours. Cardiac Enzymes: No results for input(s): CKTOTAL, CKMB, CKMBINDEX, TROPONINI in the last 168 hours. BNP (last 3 results) No results for input(s): PROBNP in the last 8760 hours. HbA1C: No results for input(s): HGBA1C in the  last 72 hours. CBG: Recent Labs  Lab 10/10/18 1022 10/10/18 1722 10/10/18 2117 10/11/18 0826 10/11/18 1122  GLUCAP 178* 343* 416* 279* 274*   Lipid Profile: No results for input(s): CHOL, HDL, LDLCALC, TRIG, CHOLHDL, LDLDIRECT in the last 72 hours. Thyroid Function Tests: No results for input(s): TSH, T4TOTAL, FREET4, T3FREE, THYROIDAB in the last 72 hours. Anemia Panel: Recent Labs    10/10/18 0301  FERRITIN 1,132*      Radiology Studies: I have reviewed all of the imaging during this hospital visit personally     Scheduled Meds: . sodium chloride   Intravenous Once  . aspirin EC  81 mg Oral Daily  .  atorvastatin  80 mg Oral q1800  . calcitRIOL  1.5 mcg Oral Q T,Th,Sa-HD  . Chlorhexidine Gluconate Cloth  6 each Topical Q0600  . [START ON 10/14/2018] darbepoetin (ARANESP) injection - DIALYSIS  200 mcg Intravenous Q Sat-HD  . feeding supplement (NEPRO CARB STEADY)  237 mL Oral BID BM  . heparin  7,500 Units Subcutaneous Q8H  . insulin aspart  0-9 Units Subcutaneous TID WC  . insulin aspart protamine- aspart  25 Units Subcutaneous BID WC  . metoprolol succinate  25 mg Oral Daily  . multivitamin  1 tablet Oral QHS   Continuous Infusions: . sodium chloride    . sodium chloride    . sodium chloride    . sodium chloride    . ceFEPime (MAXIPIME) IV 2 g (10/10/18 2123)  . vancomycin 1,000 mg (10/10/18 1536)     LOS: 2 days        Retia Cordle Gerome Apley, MD

## 2018-10-11 NOTE — Plan of Care (Signed)

## 2018-10-11 NOTE — Progress Notes (Signed)
CRITICAL VALUE ALERT  Critical Value:  Hgb 6.1  Date & Time Notied:  10/11/2018  Provider Notified: Raliegh Ip Schorr  Orders Received/Actions taken: Awaiting callback  Pt is asymptomatic. Will continue to monitor

## 2018-10-11 NOTE — Progress Notes (Signed)
Patient ID: [name], female   DOB: 05/07/1965, 53 y.o.   MRN: [id number]  [facility] Progress Note   Assessment/ Plan:   1.  Shortness of breath: Appears to be bi-factorial from volume overload/symptomatic anemia in the setting of resolving COVID-19 pneumonia.  She appears to have been doing better overnight with hemodialysis/UF and hemodialysis/ultrafiltration. 2. ESRD: Continue on TTS hemodialysis schedule with next hemodialysis scheduled for tomorrow anticipating that she will still be here. 3. Anemia: Low hemoglobin and hematocrit this morning, agree with PRBC transfusion. 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 5. Nutrition: Continue renal diet/renal multivitamin and oral nutritional supplementation. 6. Hypertension: Intermittently elevated blood pressures, volume status difficult to assess with her current habitus and attempting to challenge dry weight sequentially. 7.  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  Subjective:   Reports to be feeling better this morning with regards to shortness of breath   Objective:   BP (!) 156/105 (BP Location: Right Arm)   Pulse 69   Temp 98.4 F (36.9 C) (Oral)   Resp 18   Ht 5\' 8"  (1.727 m)   Wt 107.8 kg   LMP  (LMP Unknown)   SpO2 97%   BMI 36.14 kg/m   Physical Exam: Gen: Comfortably sitting on the edge of her bed CVS: Pulse regular rhythm, normal rate, S1 and S2 normal Resp: Diminished breath sounds over bases otherwise clear to auscultation, no rales or rhonchi Abd: Soft, obese, nontender Ext: No lower extremity edema  Labs: BMET Recent Labs  Lab 10/09/18 1725 10/09/18 1748 10/10/18 0003 10/10/18 1230  NA 140 139  --  139  K 3.6 3.5  --  5.1  CL 95*  --   --  95*  CO2 29  --   --  24  GLUCOSE 273*  --   --  274*  BUN 36*  --   --  46*  CREATININE 7.75*  --  8.31* 8.57*  CALCIUM 9.1  --   --  8.7*   PHOS  --   --   --  5.8*   CBC Recent Labs  Lab 10/09/18 1725 10/09/18 1748 10/10/18 0003 10/10/18 1230 10/11/18 0449  WBC 7.7  --  8.7 10.1 8.6  NEUTROABS 5.6  --   --   --   --   HGB 7.0* 7.8* 7.2* 7.1* 6.1*  HCT 22.7* 23.0* 23.9* 23.4* 20.0*  MCV 100.9*  --  103.0* 102.2* 100.0  PLT 192  --  202 190 202   Medications:    . sodium chloride   Intravenous Once  . aspirin EC  81 mg Oral Daily  . atorvastatin  80 mg Oral q1800  . calcitRIOL  1.5 mcg Oral Q T,Th,Sa-HD  . Chlorhexidine Gluconate Cloth  6 each Topical Q0600  . [START ON 10/14/2018] darbepoetin (ARANESP) injection - DIALYSIS  200 mcg Intravenous Q Sat-HD  . feeding supplement (NEPRO CARB STEADY)  237 mL Oral BID BM  . heparin  5,000 Units Subcutaneous Q8H  . insulin aspart  0-9 Units Subcutaneous TID WC  . insulin aspart protamine- aspart  25 Units Subcutaneous BID WC  . metoprolol succinate  25 mg Oral Daily  . multivitamin  1 tablet Oral QHS   Elmarie Shiley, MD 10/11/2018, 8:22 AM

## 2018-10-12 LAB — CBC WITH DIFFERENTIAL/PLATELET
Abs Immature Granulocytes: 0.08 10*3/uL — ABNORMAL HIGH (ref 0.00–0.07)
Basophils Absolute: 0 10*3/uL (ref 0.0–0.1)
Basophils Relative: 0 %
Eosinophils Absolute: 0.3 10*3/uL (ref 0.0–0.5)
Eosinophils Relative: 4 %
HCT: 18.3 % — ABNORMAL LOW (ref 36.0–46.0)
Hemoglobin: 5.9 g/dL — CL (ref 12.0–15.0)
Immature Granulocytes: 1 %
Lymphocytes Relative: 26 %
Lymphs Abs: 2.3 10*3/uL (ref 0.7–4.0)
MCH: 30.9 pg (ref 26.0–34.0)
MCHC: 32.2 g/dL (ref 30.0–36.0)
MCV: 95.8 fL (ref 80.0–100.0)
Monocytes Absolute: 0.5 10*3/uL (ref 0.1–1.0)
Monocytes Relative: 6 %
Neutro Abs: 5.3 10*3/uL (ref 1.7–7.7)
Neutrophils Relative %: 63 %
Platelets: 189 10*3/uL (ref 150–400)
RBC: 1.91 MIL/uL — ABNORMAL LOW (ref 3.87–5.11)
RDW: 17.7 % — ABNORMAL HIGH (ref 11.5–15.5)
WBC: 8.5 10*3/uL (ref 4.0–10.5)
nRBC: 0 % (ref 0.0–0.2)

## 2018-10-12 LAB — PREPARE RBC (CROSSMATCH)

## 2018-10-12 LAB — GLUCOSE, CAPILLARY
Glucose-Capillary: 187 mg/dL — ABNORMAL HIGH (ref 70–99)
Glucose-Capillary: 270 mg/dL — ABNORMAL HIGH (ref 70–99)
Glucose-Capillary: 229 mg/dL — ABNORMAL HIGH (ref 70–99)
Glucose-Capillary: 198 mg/dL — ABNORMAL HIGH (ref 70–99)

## 2018-10-12 LAB — BASIC METABOLIC PANEL
Anion gap: 17 — ABNORMAL HIGH (ref 5–15)
BUN: 112 mg/dL — ABNORMAL HIGH (ref 6–20)
CO2: 21 mmol/L — ABNORMAL LOW (ref 22–32)
Calcium: 8.9 mg/dL (ref 8.9–10.3)
Chloride: 94 mmol/L — ABNORMAL LOW (ref 98–111)
Creatinine, Ser: 7.43 mg/dL — ABNORMAL HIGH (ref 0.44–1.00)
GFR calc Af Amer: 7 mL/min — ABNORMAL LOW (ref >60)
GFR calc non Af Amer: 6 mL/min — ABNORMAL LOW (ref >60)
Glucose, Bld: 193 mg/dL — ABNORMAL HIGH (ref 70–99)
Potassium: 4.6 mmol/L (ref 3.5–5.1)
Sodium: 132 mmol/L — ABNORMAL LOW (ref 135–145)

## 2018-10-12 MED ORDER — SODIUM CHLORIDE 0.9% IV SOLUTION
Freq: Once | INTRAVENOUS | Status: AC
  Administered 2018-10-12: 13:00:00 via INTRAVENOUS

## 2018-10-12 MED ORDER — GUAIFENESIN-DM 100-10 MG/5ML PO SYRP
5.0000 mL | ORAL_SOLUTION | Freq: Four times a day (QID) | ORAL | Status: DC
  Administered 2018-10-12 – 2018-10-14 (×7): 5 mL via ORAL
  Filled 2018-10-12 (×7): qty 5

## 2018-10-12 MED ORDER — HEPARIN SODIUM (PORCINE) 1000 UNIT/ML DIALYSIS
40.0000 [IU]/kg | INTRAMUSCULAR | Status: DC | PRN
  Filled 2018-10-12 (×4): qty 4.3

## 2018-10-12 NOTE — Progress Notes (Signed)
Inpatient Diabetes Program Recommendations  AACE/ADA: New Consensus Statement on Inpatient Glycemic Control   Target Ranges:  Prepandial:   less than 140 mg/dL      Peak postprandial:   less than 180 mg/dL (1-2 hours)      Critically ill patients:  140 - 180 mg/dL   Results for [name], [name] (MRN [id number]) as of 10/12/2018 11:55  Ref. Range 10/11/2018 08:26 10/11/2018 11:22 10/11/2018 16:38 10/11/2018 20:40 10/12/2018 08:14  Glucose-Capillary Latest Ref Range: 70 - 99 mg/dL 279 (H) 274 (H) 233 (H) 231 (H) 187 (H)   Review of Glycemic Control  Diabetes history: DM2 Outpatient Diabetes medications: Humalog 75/25 25-30 units BID Current orders for Inpatient glycemic control: 70/30 25 units BID, Novolog 0-9 units TID with meals  Inpatient Diabetes Program Recommendations:   Insulin-Please consider increasing 70/30 to 28 units BID.  Thanks, Barnie Alderman, RN, MSN, CDE Diabetes Coordinator Inpatient Diabetes Program [phone] (Team Pager from 8am to 5pm)

## 2018-10-12 NOTE — Progress Notes (Signed)
CRITICAL VALUE ALERT  Critical Value:  Hgb 5.9  Date & Time Notied:  10/12/18 0710  Provider Notified: Riccardo Dubin Arrien  Orders Received/Actions taken: Awaiting callback

## 2018-10-12 NOTE — Progress Notes (Signed)
PROGRESS NOTE    [name]  NOB:[id number] DOB: 04-03-1965 DOA: 10/09/2018 PCP: Sandi Mariscal, MD    Brief Narrative:  53 year old female who presented with chest pain and dyspnea.  She does have significant past medical history for end-stage renal disease on hemodialysis (TTS), type II that is mellitus, hypertension chronic anemia.  Recent hospitalization August 17 for COVID-19 pneumonia.  Subsequent rehospitalization due to left thumb ischemic gangrene.  Patient reported chest pain dyspnea for about 2 days, associated with cough.  In the emergency department patient was lethargic, somnolent, received Narcan, her blood pressure was 167/97, heart rate 58, respirate 22, oxygen saturation 99%, his lungs are clear to auscultation bilaterally, heart S1-S2 present rhythm, abdomen soft, no lower extremity edema.  SARS COVID-19 was positive. Chest radiograph with right lower infiltrate, more intense compared to film from 08/26.   Patient was admitted to the hospital with a working diagnosis of metabolic encephalopathy complicated by acute hypoxic respiratory failure, volume overload, possible recurrent right lower lobe pneumonia.   Improved volume status with ultrafiltration, pneumonia has ruled out. Continue to have persistent anemia, despite one unit prbc transfusion. No signs of active bleeding.   Assessment & Plan:   Principal Problem:   Acute respiratory failure with hypoxia (HCC) Active Problems:   Type II diabetes mellitus with complication, uncontrolled ([place])   ESRD on dialysis ([place])   COVID-19 virus infection   Gangrene ([place])   Acute encephalopathy   Pneumonia   COVID-19 virus detected    1. Acute hypoxic respiratory failure due to volume overload. Dyspnea has improved with ultrafiltration, today with positive cough, her oxygen saturation is 100 on 2 LPM per [facility]. Will continue oxymetry monitoring.   2. Metabolic encephalopathy. Clinically resolved, patient is back to baseline.  Continue to hold on sedatives.   3. ESRD on HD. Plan for HD today per nephrology recommendations. Continue ultrafiltration for volume management.   4. 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 Patient sp ligation of AV fistula left arm. Continue asa and statin. Continue with local wound care.   5. Covid 19 infection with no pneumonia. No clinical signs of recurrent active infection, patient continue to test positive on admission. Continue respiratory isolation while hospitalized.   6. Acute on chronica anemia due to chronic renal disease. Persistent low Hgb despite PRBC transfusion one unit, plan to receive 2 more units today, during HD, will follow on cell count in am. Follow on hem occult stool. Continue Aranesp  7. T2DM and dyslipidemia. Fasting glucose this am at 193, will continue insulin regimen with 25 units bid of 70/30 along with insulin sliding scale. Continue atorvastatin.   8. HTN. Blood pressure 138/77 continue control with metoprolol.    9. Obesity. Calculated BMI is 36  DVT prophylaxis heparin   Code Status: full Family Communication: no family at the bedside  Disposition Plan/ discharge barriers: possible dc in am if anemia is controlled and no signs of bleeding.     Body mass index is 36.07 kg/m. Malnutrition Type:      Malnutrition Characteristics:      Nutrition Interventions:     RN Pressure Injury Documentation:     Consultants:   Nephrology   Procedures:     Antimicrobials:      Subjective: Patient complains of cough but no frank dyspnea, no nausea or vomiting, no chest pain, no melena or hematochezia. Persistent low Hgb.   Objective: Vitals:   10/12/18 0933 10/12/18 0951 10/12/18 0955 10/12/18 1200  BP:  140/72 (!) 146/66 (!) 146/66 138/77  Pulse: 78 81 82 84  Resp: 19 17 18 16   Temp: 98 F (36.7 C) 98.4 F (36.9 C)  98.1 F (36.7 C)  TempSrc: Axillary Axillary  Oral  SpO2: 100%  98% 99%  Weight:      Height:         Intake/Output Summary (Last 24 hours) at 10/12/2018 1221 Last data filed at 10/12/2018 0933 Gross per 24 hour  Intake 315 ml  Output -  Net 315 ml   Filed Weights   10/10/18 1502 10/11/18 0500 10/12/18 0630  Weight: 107.5 kg 107.8 kg 107.6 kg    Examination:   General: Not in pain or dyspnea, deconditioned  Neurology: Awake and alert, non focal  E ENT: positive pallor, no icterus, oral mucosa moist Cardiovascular: No JVD. S1-S2 present, rhythmic, no gallops, rubs, or murmurs. No lower extremity edema. Pulmonary: positive breath sounds bilaterally, adequate air movement, no wheezing, rhonchi or rales. Gastrointestinal. Abdomen with no organomegaly, non tender, no rebound or guarding Skin. No rashes Musculoskeletal: no joint deformities     Data Reviewed: I have personally reviewed following labs and imaging studies  CBC: Recent Labs  Lab 10/09/18 1725 10/09/18 1748 10/10/18 0003 10/10/18 1230 10/11/18 0449 10/12/18 0442  WBC 7.7  --  8.7 10.1 8.6 8.5  NEUTROABS 5.6  --   --   --   --  5.3  HGB 7.0* 7.8* 7.2* 7.1* 6.1* 5.9*  HCT 22.7* 23.0* 23.9* 23.4* 20.0* 18.3*  MCV 100.9*  --  103.0* 102.2* 100.0 95.8  PLT 192  --  202 190 202 101   Basic Metabolic Panel: Recent Labs  Lab 10/09/18 1725 10/09/18 1748 10/10/18 0003 10/10/18 1230 10/12/18 0442  NA 140 139  --  139 132*  K 3.6 3.5  --  5.1 4.6  CL 95*  --   --  95* 94*  CO2 29  --   --  24 21*  GLUCOSE 273*  --   --  274* 193*  BUN 36*  --   --  46* 112*  CREATININE 7.75*  --  8.31* 8.57* 7.43*  CALCIUM 9.1  --   --  8.7* 8.9  PHOS  --   --   --  5.8*  --    GFR: Estimated Creatinine Clearance: 11.1 mL/min (A) (by C-G formula based on SCr of 7.43 mg/dL (H)). Liver Function Tests: Recent Labs  Lab 10/10/18 0003 10/10/18 1230  AST 15  --   ALT 11  --   ALKPHOS 82  --   BILITOT 0.9  --   PROT 7.4  --   ALBUMIN 2.9* 3.0*   No results for input(s): LIPASE, AMYLASE in the last 168 hours. Recent  Labs  Lab 10/10/18 0003  AMMONIA 28   Coagulation Profile: No results for input(s): INR, PROTIME in the last 168 hours. Cardiac Enzymes: No results for input(s): CKTOTAL, CKMB, CKMBINDEX, TROPONINI in the last 168 hours. BNP (last 3 results) No results for input(s): PROBNP in the last 8760 hours. HbA1C: No results for input(s): HGBA1C in the last 72 hours. CBG: Recent Labs  Lab 10/11/18 1122 10/11/18 1638 10/11/18 2040 10/12/18 0814 10/12/18 1209  GLUCAP 274* 233* 231* 187* 270*   Lipid Profile: No results for input(s): CHOL, HDL, LDLCALC, TRIG, CHOLHDL, LDLDIRECT in the last 72 hours. Thyroid Function Tests: No results for input(s): TSH, T4TOTAL, FREET4, T3FREE, THYROIDAB in the last 72 hours. Anemia Panel: Recent  Labs    10/10/18 0301  FERRITIN 1,132*      Radiology Studies: I have reviewed all of the imaging during this hospital visit personally     Scheduled Meds: . sodium chloride   Intravenous Once  . sodium chloride   Intravenous Once  . aspirin EC  81 mg Oral Daily  . atorvastatin  80 mg Oral q1800  . calcitRIOL  1.5 mcg Oral Q T,Th,Sa-HD  . Chlorhexidine Gluconate Cloth  6 each Topical Q0600  . [START ON 10/14/2018] darbepoetin (ARANESP) injection - DIALYSIS  200 mcg Intravenous Q Sat-HD  . feeding supplement (NEPRO CARB STEADY)  237 mL Oral BID BM  . heparin  7,500 Units Subcutaneous Q8H  . insulin aspart  0-9 Units Subcutaneous TID WC  . insulin aspart protamine- aspart  25 Units Subcutaneous BID WC  . metoprolol succinate  25 mg Oral Daily  . multivitamin  1 tablet Oral QHS   Continuous Infusions: . sodium chloride    . sodium chloride    . sodium chloride    . sodium chloride       LOS: 3 days        Mauricio Gerome Apley, MD

## 2018-10-12 NOTE — Progress Notes (Signed)
Patient ID: [name], female   DOB: 01/31/1966, 53 y.o.   MRN: [id number]  [facility] Progress Note   Assessment/ Plan:   1.  Shortness of breath: Appears to be bi-factorial from volume overload/symptomatic anemia in the setting of resolving COVID-19 pneumonia.  She has recurrent dyspnea this morning which possibly might be from her anemia rather than significant volume overload; she will get dialysis today with PRBC transfusion. 2. ESRD: Continue on TTS hemodialysis schedule with next hemodialysis scheduled for today; will need to do it here so that she can get tandem blood transfusion and undergo evaluation for etiology of her anemia. 3. Anemia: Low hemoglobin and hematocrit this morning in spite of PRBC transfusion yesterday-I am concerned with her elevated BUN which may indicate upper GI bleed to explain her persistent anemia without overt loss.  I will order Hemoccult to help guide additional management. 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 5. Nutrition: Continue renal diet/renal multivitamin and oral nutritional supplementation. 6. Hypertension: Intermittently elevated blood pressures, volume status difficult to assess with her current habitus and attempting to challenge dry weight sequentially. 7.  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  Subjective:   Reports to be having some shortness of breath this morning and "ears feel stopped up"   Objective:   BP (!) 149/67 (BP Location: Right Arm)   Pulse 73   Temp 98.4 F (36.9 C) (Oral)   Resp (!) 22   Ht 5\' 8"  (1.727 m)   Wt 107.6 kg   LMP  (LMP Unknown)   SpO2 96%   BMI 36.07 kg/m   Physical Exam: Gen: Comfortably sitting up in a chair CVS: Pulse regular rhythm, normal rate, S1 and S2 normal Resp: Diminished breath sounds over bases without rales or rhonchi Abd: Soft, obese, nontender Ext: No lower  extremity edema  Labs: BMET Recent Labs  Lab 10/09/18 1725 10/09/18 1748 10/10/18 0003 10/10/18 1230 10/12/18 0442  NA 140 139  --  139 132*  K 3.6 3.5  --  5.1 4.6  CL 95*  --   --  95* 94*  CO2 29  --   --  24 21*  GLUCOSE 273*  --   --  274* 193*  BUN 36*  --   --  46* 112*  CREATININE 7.75*  --  8.31* 8.57* 7.43*  CALCIUM 9.1  --   --  8.7* 8.9  PHOS  --   --   --  5.8*  --    CBC Recent Labs  Lab 10/09/18 1725  10/10/18 0003 10/10/18 1230 10/11/18 0449 10/12/18 0442  WBC 7.7  --  8.7 10.1 8.6 8.5  NEUTROABS 5.6  --   --   --   --  5.3  HGB 7.0*   < > 7.2* 7.1* 6.1* 5.9*  HCT 22.7*   < > 23.9* 23.4* 20.0* 18.3*  MCV 100.9*  --  103.0* 102.2* 100.0 95.8  PLT 192  --  202 190 202 189   < > = values in this interval not displayed.   Medications:    . sodium chloride   Intravenous Once  . aspirin EC  81 mg Oral Daily  . atorvastatin  80 mg Oral q1800  . calcitRIOL  1.5 mcg Oral Q T,Th,Sa-HD  . Chlorhexidine Gluconate Cloth  6 each Topical Q0600  . [START ON 10/14/2018] darbepoetin (ARANESP) injection - DIALYSIS  200  mcg Intravenous Q Sat-HD  . feeding supplement (NEPRO CARB STEADY)  237 mL Oral BID BM  . heparin  7,500 Units Subcutaneous Q8H  . insulin aspart  0-9 Units Subcutaneous TID WC  . insulin aspart protamine- aspart  25 Units Subcutaneous BID WC  . metoprolol succinate  25 mg Oral Daily  . multivitamin  1 tablet Oral QHS   Elmarie Shiley, MD 10/12/2018, 8:16 AM

## 2018-10-13 ENCOUNTER — Inpatient Hospital Stay (HOSPITAL_COMMUNITY): Payer: Medicare Other

## 2018-10-13 LAB — BASIC METABOLIC PANEL
Anion gap: 14 (ref 5–15)
BUN: 66 mg/dL — ABNORMAL HIGH (ref 6–20)
CO2: 22 mmol/L (ref 22–32)
Calcium: 8.9 mg/dL (ref 8.9–10.3)
Chloride: 97 mmol/L — ABNORMAL LOW (ref 98–111)
Creatinine, Ser: 5.51 mg/dL — ABNORMAL HIGH (ref 0.44–1.00)
GFR calc Af Amer: 9 mL/min — ABNORMAL LOW (ref >60)
GFR calc non Af Amer: 8 mL/min — ABNORMAL LOW (ref >60)
Glucose, Bld: 200 mg/dL — ABNORMAL HIGH (ref 70–99)
Potassium: 3.8 mmol/L (ref 3.5–5.1)
Sodium: 133 mmol/L — ABNORMAL LOW (ref 135–145)

## 2018-10-13 LAB — HEMOGLOBIN AND HEMATOCRIT, BLOOD
HCT: 23.1 % — ABNORMAL LOW (ref 36.0–46.0)
Hemoglobin: 7.8 g/dL — ABNORMAL LOW (ref 12.0–15.0)

## 2018-10-13 LAB — CBC WITH DIFFERENTIAL/PLATELET
Abs Immature Granulocytes: 0.09 10*3/uL — ABNORMAL HIGH (ref 0.00–0.07)
Basophils Absolute: 0 10*3/uL (ref 0.0–0.1)
Basophils Relative: 0 %
Eosinophils Absolute: 0.4 10*3/uL (ref 0.0–0.5)
Eosinophils Relative: 5 %
HCT: 21.5 % — ABNORMAL LOW (ref 36.0–46.0)
Hemoglobin: 7.1 g/dL — ABNORMAL LOW (ref 12.0–15.0)
Immature Granulocytes: 1 %
Lymphocytes Relative: 18 %
Lymphs Abs: 1.4 10*3/uL (ref 0.7–4.0)
MCH: 30.7 pg (ref 26.0–34.0)
MCHC: 33 g/dL (ref 30.0–36.0)
MCV: 93.1 fL (ref 80.0–100.0)
Monocytes Absolute: 0.6 10*3/uL (ref 0.1–1.0)
Monocytes Relative: 7 %
Neutro Abs: 5.5 10*3/uL (ref 1.7–7.7)
Neutrophils Relative %: 69 %
Platelets: 186 10*3/uL (ref 150–400)
RBC: 2.31 MIL/uL — ABNORMAL LOW (ref 3.87–5.11)
RDW: 16.8 % — ABNORMAL HIGH (ref 11.5–15.5)
WBC: 8.1 10*3/uL (ref 4.0–10.5)
nRBC: 0 % (ref 0.0–0.2)

## 2018-10-13 LAB — GLUCOSE, CAPILLARY
Glucose-Capillary: 181 mg/dL — ABNORMAL HIGH (ref 70–99)
Glucose-Capillary: 181 mg/dL — ABNORMAL HIGH (ref 70–99)
Glucose-Capillary: 219 mg/dL — ABNORMAL HIGH (ref 70–99)
Glucose-Capillary: 183 mg/dL — ABNORMAL HIGH (ref 70–99)

## 2018-10-13 LAB — PREPARE RBC (CROSSMATCH)

## 2018-10-13 MED ORDER — IOHEXOL 300 MG/ML  SOLN
100.0000 mL | Freq: Once | INTRAMUSCULAR | Status: AC | PRN
  Administered 2018-10-13: 100 mL via INTRAVENOUS

## 2018-10-13 MED ORDER — PANTOPRAZOLE SODIUM 40 MG PO TBEC
40.0000 mg | DELAYED_RELEASE_TABLET | Freq: Every day | ORAL | Status: DC
  Administered 2018-10-13 – 2018-10-14 (×2): 40 mg via ORAL
  Filled 2018-10-13 (×2): qty 1

## 2018-10-13 MED ORDER — SODIUM CHLORIDE 0.9% IV SOLUTION
Freq: Once | INTRAVENOUS | Status: AC
  Administered 2018-10-13: 12:00:00 via INTRAVENOUS

## 2018-10-13 NOTE — Progress Notes (Signed)
Inpatient Diabetes Program Recommendations  AACE/ADA: New Consensus Statement on Inpatient Glycemic Control   Target Ranges:  Prepandial:   less than 140 mg/dL      Peak postprandial:   less than 180 mg/dL (1-2 hours)      Critically ill patients:  140 - 180 mg/dL   Results for [name], [name] (MRN [id number]) as of 10/13/2018 10:12  Ref. Range 10/12/2018 08:14 10/12/2018 12:09 10/12/2018 17:00 10/12/2018 21:39 10/13/2018 08:01  Glucose-Capillary Latest Ref Range: 70 - 99 mg/dL 187 (H) 270 (H) 229 (H) 198 (H) 181 (H)   Review of Glycemic Control  Diabetes history: DM2 Outpatient Diabetes medications: Humalog 75/25 25-30 units BID Current orders for Inpatient glycemic control: 70/30 25 units BID, Novolog 0-9 units TID with meals  Inpatient Diabetes Program Recommendations:   Insulin-Please consider increasing 70/30 to 28 units BID.  Thanks, Barnie Alderman, RN, MSN, CDE Diabetes Coordinator Inpatient Diabetes Program [phone] (Team Pager from 8am to 5pm)

## 2018-10-13 NOTE — Plan of Care (Signed)

## 2018-10-13 NOTE — Progress Notes (Signed)
Pt did not receive 2nd unit of blood during dialysis. Dr Justin Mend called and order placed to give unit. Suggested doing post CBC. No new orders to do CBC sooner than this am.

## 2018-10-13 NOTE — Progress Notes (Signed)
Patient to resume OP HD treatment at [facility] clinic on a TTS schedule with a seat time of 12:00pm. She needs to arrive at 11:45am. She will resume transportation with [facility]. Renal Navigator has reviewed this with patient previously, but was unable to reach patient today. Renal Navigator asked CSW/L. Paisley to note in AVS. Renal Navigator spoke with MSW at [facility] who will reinstate [facility] transportation starting tomorrow.  Alphonzo Cruise, [place] Renal Navigator [phone]

## 2018-10-13 NOTE — Progress Notes (Signed)
Patient ID: [name], female   DOB: 1965/03/15, 53 y.o.   MRN: [id number]  [facility] Progress Note   Assessment/ Plan:   1.  Shortness of breath: Appears to be bi-factorial from volume overload/symptomatic anemia in the setting of resolving COVID-19 pneumonia.  Improving status post PRBC transfusion/hemodialysis.  Discussed need to restrict IDWG 2. ESRD: Continue on TTS hemodialysis schedule with next hemodialysis scheduled for tomorrow; she can possibly discharge if cleared by GI for outpatient evaluation and gets an additional unit of PRBC transfusion. 3. Anemia: Low hemoglobin and hematocrit this morning in spite of PRBC transfusion yesterday-I am concerned with her elevated BUN which may indicate upper GI bleed to explain her persistent anemia without overt loss.  Dr. Cathlean Sauer to contact GI. 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 5. Nutrition: Continue renal diet/renal multivitamin and oral nutritional supplementation. 6. Hypertension: Blood pressures appear satisfactorily controlled, continue to monitor with HD/UF/medications 7.  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  Subjective:   Reports improvement of her shortness of breath overnight.  She informs me that she got 2 units of PRBCs.   Objective:   BP 134/78   Pulse 77   Temp 97.9 F (36.6 C) (Oral)   Resp 20   Ht 5\' 8"  (1.727 m)   Wt 112.6 kg Comment: weighed in bed   LMP  (LMP Unknown)   SpO2 99%   BMI 37.74 kg/m   Physical Exam: Gen: Comfortably sitting on the side of her bed CVS: Pulse regular rhythm, normal rate, S1 and S2 normal Resp: Diminished breath sounds over bases without rales or rhonchi Abd: Soft, obese, nontender Ext: No lower extremity edema  Labs: BMET Recent Labs  Lab 10/09/18 1725 10/09/18 1748 10/10/18 0003 10/10/18 1230 10/12/18 0442 10/13/18 0544  NA 140 139  --  139  132* 133*  K 3.6 3.5  --  5.1 4.6 3.8  CL 95*  --   --  95* 94* 97*  CO2 29  --   --  24 21* 22  GLUCOSE 273*  --   --  274* 193* 200*  BUN 36*  --   --  46* 112* 66*  CREATININE 7.75*  --  8.31* 8.57* 7.43* 5.51*  CALCIUM 9.1  --   --  8.7* 8.9 8.9  PHOS  --   --   --  5.8*  --   --    CBC Recent Labs  Lab 10/09/18 1725  10/10/18 1230 10/11/18 0449 10/12/18 0442 10/13/18 0544  WBC 7.7   < > 10.1 8.6 8.5 8.1  NEUTROABS 5.6  --   --   --  5.3 5.5  HGB 7.0*   < > 7.1* 6.1* 5.9* 7.1*  HCT 22.7*   < > 23.4* 20.0* 18.3* 21.5*  MCV 100.9*   < > 102.2* 100.0 95.8 93.1  PLT 192   < > 190 202 189 186   < > = values in this interval not displayed.   Medications:    . sodium chloride   Intravenous Once  . aspirin EC  81 mg Oral Daily  . atorvastatin  80 mg Oral q1800  . calcitRIOL  1.5 mcg Oral Q T,Th,Sa-HD  . Chlorhexidine Gluconate Cloth  6 each Topical Q0600  . [START ON 10/14/2018] darbepoetin (ARANESP) injection - DIALYSIS  200 mcg Intravenous Q Sat-HD  . feeding supplement (NEPRO CARB STEADY)  237 mL  Oral BID BM  . guaiFENesin-dextromethorphan  5 mL Oral Q6H  . heparin  7,500 Units Subcutaneous Q8H  . insulin aspart  0-9 Units Subcutaneous TID WC  . insulin aspart protamine- aspart  25 Units Subcutaneous BID WC  . metoprolol succinate  25 mg Oral Daily  . multivitamin  1 tablet Oral QHS   Elmarie Shiley, MD 10/13/2018, 8:47 AM

## 2018-10-13 NOTE — Progress Notes (Signed)
I discussed case with hospital team at the request of the nephrology team and they just wanted to discuss and get my opinion and did not asked me to see the patient but she has no signs of active bleeding and does have chronic anemia and her ferritin is high and she has not had a CT or other GI work-up but does not have any other GI complaints and I do think the first test could be a CT scan to rule out anything significant and then if no signs of bleeding can hold GI work-up until medically stable and possibly even be done as an outpatient however please call us back if signs of active bleeding or if CT shows something significant that needs our evaluation

## 2018-10-13 NOTE — Progress Notes (Addendum)
PROGRESS NOTE    Antoinett Dorman  OXB:[id number] DOB: [birth year] DOA: 10/09/2018 PCP: Sandi Mariscal, MD    Brief Narrative:  53 year old female who presented with chest pain and dyspnea. She does have significant past medical history for end-stage renal disease on hemodialysis (TTS),type II that is mellitus, hypertension chronic anemia. Recent hospitalization August 17 for COVID-19 pneumonia. Subsequent rehospitalization due to left thumb ischemic gangrene.Patient reported chest pain dyspnea for about 2 days, associated with cough.In the emergency department patient was lethargic, somnolent, received Narcan, herblood pressure was 167/97, heart rate 58, respirate 22, oxygen saturation 99%, hislungs are clear to auscultation bilaterally, heart S1-S2 present rhythm, abdomen soft, no lower extremity edema. SARS COVID-19 was positive. Chest radiograph with right lower infiltrate, more intense compared to film from 08/26.  Patient was admitted to the hospital with a working diagnosis of metabolic encephalopathy complicated by acute hypoxic respiratory failure, volume overload, possible recurrent right lower lobe pneumonia.  Improved volume status with ultrafiltration, pneumonia has ruled out. Continue to have persistent anemia, despite one unit prbc transfusion. No signs of active bleeding.   Her encephalopathy likely multifactorial and medication related improved after with conservative medical therapy.   Assessment & Plan:   Principal Problem:   Acute respiratory failure with hypoxia (HCC) Active Problems:   Type II diabetes mellitus with complication, uncontrolled ([place])   ESRD on dialysis ([place])   COVID-19 virus infection   Gangrene ([place])   Acute encephalopathy   Pneumonia   COVID-19 virus detected   1. Acute hypoxic respiratory failuredue tovolume overload. Patient with improved symptoms, tolerating well ultrafiltration, her oxygenation today is 93 to 99% on 2 LPM per [place]. Out of  bed to chair. Next hemodialysis will be in am.   2. Metabolic encephalopathy. Clinically resolved.  3. ESRD on HD. Tolerating HD well, next session in am, per nephrology recommendations.  4. 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 Patient sp ligation of AV fistula left arm. Medical therapy with asa and atorvastatin. Local wound care.   5. Covid 19infection with no pneumonia.Initial PCR positive on 08/13, on admission continue to be positive, but no clinical signs of active infection. Will continue respiratory isolation for now.   6. Acute on chronica anemia due to chronic renal disease. sp 2 units PRBC with persistent low Hgb down to 7,1, continue to have no signs of active bleeding, no hematochezia or melena. Tolerating po well. Case discussed with Dr. Benson Norway from GI, will order a CT of the abdomen and pelvis, if negative likely will continue with work up as outpatient once more COVID infection resolves. Will add pantoprazole and hold on asa for now.   7. T2DM and dyslipidemia. Fasting glucose this am at 200. On 25 units bid of 70/30 along with insulin sliding scale. On atorvastatin.   8. HTN. On metoprolol for blood pressure control.   9. Obesity. Calculated BMI is 37  DVT prophylaxisheparin Code Status:full Family Communication:no family at the bedside Disposition Plan/ discharge barriers:dc home if CT abdomen and pelvis negative.   Body mass index is 37.74 kg/m. Malnutrition Type:      Malnutrition Characteristics:      Nutrition Interventions:     RN Pressure Injury Documentation:     Consultants:   GI (phone)  Nephrology   Procedures:     Antimicrobials:       Subjective: Patient with no melena, no hematochezia, no nausea or vomiting. Her dyspnea is persistent but less intense.   Objective: Vitals:   10/13/18  0815 10/13/18 1147 10/13/18 1205 10/13/18 1330  BP: 134/78 137/78 (!) 151/80 (!) 160/67  Pulse: 79 79 89   Resp: 18  16 17 20   Temp: 97.9 F (36.6 C) 98.1 F (36.7 C) 97.9 F (36.6 C)   TempSrc: Oral Oral Oral   SpO2: 99% 96% 93% 94%  Weight:      Height:        Intake/Output Summary (Last 24 hours) at 10/13/2018 1415 Last data filed at 10/13/2018 1205 Gross per 24 hour  Intake 1639 ml  Output 2800 ml  Net -1161 ml   Filed Weights   10/11/18 0500 10/12/18 0630 10/12/18 1600  Weight: 107.8 kg 107.6 kg 112.6 kg    Examination:   General: Not in pain or dyspnea, deconditioned  Neurology: Awake and alert, non focal  E ENT: positive pallor, no icterus, oral mucosa moist Cardiovascular: No JVD. S1-S2 present, rhythmic, no gallops, rubs, or murmurs. No lower extremity edema. Pulmonary: positive breath sounds bilaterally, adequate air movement, no wheezing, rhonchi or rales. Gastrointestinal. Abdomen with no organomegaly, non tender, no rebound or guarding Skin. No rashes/ left thumb with dressing in place.  Musculoskeletal: no joint deformities     Data Reviewed: I have personally reviewed following labs and imaging studies  CBC: Recent Labs  Lab 10/09/18 1725  10/10/18 0003 10/10/18 1230 10/11/18 0449 10/12/18 0442 10/13/18 0544  WBC 7.7  --  8.7 10.1 8.6 8.5 8.1  NEUTROABS 5.6  --   --   --   --  5.3 5.5  HGB 7.0*   < > 7.2* 7.1* 6.1* 5.9* 7.1*  HCT 22.7*   < > 23.9* 23.4* 20.0* 18.3* 21.5*  MCV 100.9*  --  103.0* 102.2* 100.0 95.8 93.1  PLT 192  --  202 190 202 189 186   < > = values in this interval not displayed.   Basic Metabolic Panel: Recent Labs  Lab 10/09/18 1725 10/09/18 1748 10/10/18 0003 10/10/18 1230 10/12/18 0442 10/13/18 0544  NA 140 139  --  139 132* 133*  K 3.6 3.5  --  5.1 4.6 3.8  CL 95*  --   --  95* 94* 97*  CO2 29  --   --  24 21* 22  GLUCOSE 273*  --   --  274* 193* 200*  BUN 36*  --   --  46* 112* 66*  CREATININE 7.75*  --  8.31* 8.57* 7.43* 5.51*  CALCIUM 9.1  --   --  8.7* 8.9 8.9  PHOS  --   --   --  5.8*  --   --    GFR: Estimated  Creatinine Clearance: 15.4 mL/min (A) (by C-G formula based on SCr of 5.51 mg/dL (H)). Liver Function Tests: Recent Labs  Lab 10/10/18 0003 10/10/18 1230  AST 15  --   ALT 11  --   ALKPHOS 82  --   BILITOT 0.9  --   PROT 7.4  --   ALBUMIN 2.9* 3.0*   No results for input(s): LIPASE, AMYLASE in the last 168 hours. Recent Labs  Lab 10/10/18 0003  AMMONIA 28   Coagulation Profile: No results for input(s): INR, PROTIME in the last 168 hours. Cardiac Enzymes: No results for input(s): CKTOTAL, CKMB, CKMBINDEX, TROPONINI in the last 168 hours. BNP (last 3 results) No results for input(s): PROBNP in the last 8760 hours. HbA1C: No results for input(s): HGBA1C in the last 72 hours. CBG: Recent Labs  Lab 10/12/18 1209  10/12/18 1700 10/12/18 2139 10/13/18 0801 10/13/18 1128  GLUCAP 270* 229* 198* 181* 181*   Lipid Profile: No results for input(s): CHOL, HDL, LDLCALC, TRIG, CHOLHDL, LDLDIRECT in the last 72 hours. Thyroid Function Tests: No results for input(s): TSH, T4TOTAL, FREET4, T3FREE, THYROIDAB in the last 72 hours. Anemia Panel: No results for input(s): VITAMINB12, FOLATE, FERRITIN, TIBC, IRON, RETICCTPCT in the last 72 hours.    Radiology Studies: I have reviewed all of the imaging during this hospital visit personally     Scheduled Meds: . sodium chloride   Intravenous Once  . atorvastatin  80 mg Oral q1800  . calcitRIOL  1.5 mcg Oral Q T,Th,Sa-HD  . Chlorhexidine Gluconate Cloth  6 each Topical Q0600  . [START ON 10/14/2018] darbepoetin (ARANESP) injection - DIALYSIS  200 mcg Intravenous Q Sat-HD  . feeding supplement (NEPRO CARB STEADY)  237 mL Oral BID BM  . guaiFENesin-dextromethorphan  5 mL Oral Q6H  . heparin  7,500 Units Subcutaneous Q8H  . insulin aspart  0-9 Units Subcutaneous TID WC  . insulin aspart protamine- aspart  25 Units Subcutaneous BID WC  . metoprolol succinate  25 mg Oral Daily  . multivitamin  1 tablet Oral QHS   Continuous  Infusions: . sodium chloride    . sodium chloride    . sodium chloride    . sodium chloride       LOS: 4 days        Wilmore Holsomback Gerome Apley, MD

## 2018-10-14 DIAGNOSIS — D638 Anemia in other chronic diseases classified elsewhere: Secondary | ICD-10-CM

## 2018-10-14 LAB — TYPE AND SCREEN
ABO/RH(D): A POS
Antibody Screen: NEGATIVE
Unit division: 0
Unit division: 0
Unit division: 0
Unit division: 0

## 2018-10-14 LAB — CBC WITH DIFFERENTIAL/PLATELET
Abs Immature Granulocytes: 0.12 10*3/uL — ABNORMAL HIGH (ref 0.00–0.07)
Basophils Absolute: 0 10*3/uL (ref 0.0–0.1)
Basophils Relative: 1 %
Eosinophils Absolute: 0.5 10*3/uL (ref 0.0–0.5)
Eosinophils Relative: 5 %
HCT: 22.7 % — ABNORMAL LOW (ref 36.0–46.0)
Hemoglobin: 7.4 g/dL — ABNORMAL LOW (ref 12.0–15.0)
Immature Granulocytes: 1 %
Lymphocytes Relative: 18 %
Lymphs Abs: 1.6 10*3/uL (ref 0.7–4.0)
MCH: 30.6 pg (ref 26.0–34.0)
MCHC: 32.6 g/dL (ref 30.0–36.0)
MCV: 93.8 fL (ref 80.0–100.0)
Monocytes Absolute: 0.6 10*3/uL (ref 0.1–1.0)
Monocytes Relative: 7 %
Neutro Abs: 5.8 10*3/uL (ref 1.7–7.7)
Neutrophils Relative %: 68 %
Platelets: 197 10*3/uL (ref 150–400)
RBC: 2.42 MIL/uL — ABNORMAL LOW (ref 3.87–5.11)
RDW: 16.9 % — ABNORMAL HIGH (ref 11.5–15.5)
WBC: 8.6 10*3/uL (ref 4.0–10.5)
nRBC: 0 % (ref 0.0–0.2)

## 2018-10-14 LAB — BPAM RBC
Blood Product Expiration Date: 202009122359
Blood Product Expiration Date: 202010022359
Blood Product Expiration Date: 202010082359
Blood Product Expiration Date: 202010092359
ISSUE DATE / TIME: 202009091049
ISSUE DATE / TIME: 202009100915
ISSUE DATE / TIME: 202009102106
ISSUE DATE / TIME: 202009111140
Unit Type and Rh: 6200
Unit Type and Rh: 6200
Unit Type and Rh: 6200
Unit Type and Rh: 6200

## 2018-10-14 LAB — BASIC METABOLIC PANEL
Anion gap: 18 — ABNORMAL HIGH (ref 5–15)
BUN: 84 mg/dL — ABNORMAL HIGH (ref 6–20)
CO2: 19 mmol/L — ABNORMAL LOW (ref 22–32)
Calcium: 9 mg/dL (ref 8.9–10.3)
Chloride: 93 mmol/L — ABNORMAL LOW (ref 98–111)
Creatinine, Ser: 7.12 mg/dL — ABNORMAL HIGH (ref 0.44–1.00)
GFR calc Af Amer: 7 mL/min — ABNORMAL LOW (ref >60)
GFR calc non Af Amer: 6 mL/min — ABNORMAL LOW (ref >60)
Glucose, Bld: 202 mg/dL — ABNORMAL HIGH (ref 70–99)
Potassium: 4.3 mmol/L (ref 3.5–5.1)
Sodium: 130 mmol/L — ABNORMAL LOW (ref 135–145)

## 2018-10-14 LAB — GLUCOSE, CAPILLARY
Glucose-Capillary: 165 mg/dL — ABNORMAL HIGH (ref 70–99)
Glucose-Capillary: 183 mg/dL — ABNORMAL HIGH (ref 70–99)

## 2018-10-14 MED ORDER — HEPARIN SODIUM (PORCINE) 1000 UNIT/ML IJ SOLN
3.4000 mL | Freq: Once | INTRAMUSCULAR | Status: AC
  Administered 2018-10-14: 3400 [IU] via INTRAVENOUS

## 2018-10-14 MED ORDER — PANTOPRAZOLE SODIUM 40 MG PO TBEC: 40.0000 mg | DELAYED_RELEASE_TABLET | Freq: Every day | ORAL | 0 refills | Status: DC

## 2018-10-14 MED ORDER — HEPARIN SODIUM (PORCINE) 1000 UNIT/ML IJ SOLN
INTRAMUSCULAR | Status: AC
  Filled 2018-10-14: qty 4

## 2018-10-14 MED ORDER — DARBEPOETIN ALFA 200 MCG/0.4ML IJ SOSY
PREFILLED_SYRINGE | INTRAMUSCULAR | Status: AC
  Filled 2018-10-14: qty 0.4

## 2018-10-14 MED ORDER — CALCITRIOL 0.5 MCG PO CAPS
ORAL_CAPSULE | ORAL | Status: AC
  Filled 2018-10-14: qty 3

## 2018-10-14 NOTE — Progress Notes (Signed)
CSW notified by Renal Navigator that patient may end up needing PTAR home. CSW attempted to reach patient in the room this morning but she didn't answer the phone. RN asked, and patient indicated that her cousin could pick her up today.  CSW contacted by RN this afternoon that patient's cousin canceled, unable to pick her up today. Patient will need PTAR if she's discharged. CSW completed PTAR forms and left on chart, as patient will need to have CT completed before discharge and unsure when they will take her for that. If patient is discharged home, RN to call for PTAR.  No further CSW needs at this time.  Laveda Abbe, [place] Clinical Social Worker [phone]

## 2018-10-14 NOTE — TOC Transition Note (Signed)
Transition of Care [facility]) - CM/SW Discharge Note   Patient Details  Name: [name] MRN: [id number] Date of Birth: 1965/10/10  Transition of Care [facility]) CM/SW Contact:  Pollie Friar, RN Phone Number: 10/14/2018, 11:25 AM   Clinical Narrative:    Pt is discharging home with self care. Pt requesting PTAR home. CM called and verified pt's on is at the home.  PTAR arranged. Bedside RN updated. D/c packet in her chart on the unit.   Final next level of care: Home/Self Care Barriers to Discharge: No Barriers Identified   Patient Goals and CMS Choice        Discharge Placement                       Discharge Plan and Services                                     Social Determinants of Health (SDOH) Interventions     Readmission Risk Interventions No flowsheet data found.

## 2018-10-14 NOTE — Procedures (Signed)
Patient seen on Hemodialysis. BP (!) 167/67   Pulse 72   Temp 97.8 F (36.6 C) (Oral)   Resp 16   Ht 5\' 8"  (1.727 m)   Wt 113.9 kg   LMP  (LMP Unknown)   SpO2 96%   BMI 38.18 kg/m   QB 400, UF goal 3L Tolerating treatment without complaints at this time.   Elmarie Shiley MD [facility]. Office # [phone] Pager # [phone] 9:05 AM

## 2018-10-14 NOTE — Discharge Summary (Signed)
Physician Discharge Summary  [name] EXH:[id number] DOB: May 12, 1965 DOA: 10/09/2018  PCP: Sandi Mariscal, MD  Admit date: 10/09/2018 Discharge date: 10/14/2018  Admitted From: Home  Disposition:  Home   Recommendations for Outpatient Follow-up and new medication changes:  1. Follow up with Dr. Nancy Fetter in 7 days.  2. Follow with urology in respect of renal mass.  3. Follow with cell count as outpatient.   Home Health: no   Equipment/Devices: no    Discharge Condition: stable  CODE STATUS: full  Diet recommendation: heart healthy   Brief/Interim Summary: 53 year old female who presented with chest pain and dyspnea. She does have significant past medical history for end-stage renal disease on hemodialysis (TTS),type II diabetes mellitus, hypertension and chronic anemia of renal disease. Recent hospitalization August 17 for COVID-19 pneumonia. Subsequent rehospitalization due to left thumb ischemic gangrene. Patient reported chest pain and dyspnea for about 2 days, associated with cough.In the emergency department patient was lethargic, somnolent, received Narcan, herblood pressure was 167/97, heart rate 58, respiratory rate 22, oxygen saturation 99%, hislungs were clear to auscultation bilaterally, heart S1-S2 present and rhythmic, abdomen soft, no lower extremity edema. SARS COVID-19 was positive.  Sodium 140, potassium 3.6, chloride 95, bicarb 29, glucose 173, BUN 36, creatinine 7.75.  White count 7.7, hemoglobin 7.0, hematocrit 23.7, platelets 192.  EKG 83 bpm, normal axis, right bundle branch block, prolonged QTC 555, sinus rhythm with PAC/bigeminy pattern, no ST segment or T wave changes. Chest radiograph with right lower lobe infiltrate, more intense compared to film from 08/26.  Patient was admitted to the hospital with a working diagnosis of metabolic encephalopathy complicated by acute hypoxic respiratory failure, volume overload, possible recurrent right lower lobe  pneumonia.  Improved volume status with ultrafiltration, pneumonia has ruled out. Continue to have persistent anemia, despite prbc transfusion no active bleeding.  Her encephalopathy likely multifactorial and medication related improved after with conservative medical therapy.   1.  Acute hypoxic respiratory failure due to volume overload.  Patient underwent hemodialysis with ultrafiltration with improvement in volume status.  Pneumonia was ruled out, no signs of recurrent active COVID-19 pneumonia.  Patient remained on respiratory isolation while hospitalized.  Her oxygen saturation at discharge 98%.  2.  Multifactorial metabolic encephalopathy.  Patient received supportive medical care, her mentation improved rapidly back to baseline.  3.  Acute on chronic anemia, possible anemia chronic renal disease.  No signs of active bleeding.  Her lowest hemoglobin reached 5.9, hematocrit 18.3, she received 3 units packed red blood cells, her discharge hemoglobin 7.4, hematocrit 22.7.  She was evaluated by gastroenterology who recommended CT of the abdomen pelvis which was negative for any gastrointestinal mass.  Patient will follow-up as an outpatient once COVID-19 clears.  Patient will be discharged on pantoprazole.  4.  End-stage renal disease on hemodialysis.  Patient underwent hemodialysis with no major complications.  5.  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  Patient had a recent ligation of AV fistula left arm.  She will continue aspirin, atorvastatin and local wound care.  6.  Type II that is mellitus with dyslipidemia.  Patient received insulin, 7030 and sliding scale.  Good toleration.  7.  Hypertension.  Continue metoprolol for blood pressure control.  8.  Obesity.  Calculated BMI 37.  Discharge Diagnoses:  Principal Problem:   Acute respiratory failure with hypoxia (HCC) Active Problems:   Type II diabetes mellitus with complication, uncontrolled ([place])   ESRD on dialysis  ([place])   COVID-19 virus infection  Gangrene ([place])   Acute encephalopathy   COVID-19 virus detected    Discharge Instructions   Allergies as of 10/14/2018   No Known Allergies     Medication List    STOP taking these medications   Enbrel 50 MG/ML injection Generic drug: etanercept     TAKE these medications   ascorbic acid 500 MG tablet Commonly known as: VITAMIN C Take 1 tablet (500 mg total) by mouth daily.   atorvastatin 80 MG tablet Commonly known as: LIPITOR Take 1 tablet (80 mg total) by mouth daily. Take 1/2 tab daily for one week, then increase to the full tab. What changed: additional instructions   Auryxia 1 GM 210 MG(Fe) tablet Generic drug: ferric citrate Take 630 mg by mouth 3 (three) times daily with meals.   b complex-vitamin c-folic acid 0.8 MG Tabs tablet Take 1 tablet by mouth Every Tuesday,Thursday,and Saturday with dialysis.   benzonatate 200 MG capsule Commonly known as: TESSALON Take 1 capsule (200 mg total) by mouth 3 (three) times daily as needed for cough.   diphenoxylate-atropine 2.5-0.025 MG tablet Commonly known as: Lomotil Take 1 tablet by mouth 4 (four) times daily as needed for diarrhea or loose stools.   HumaLOG Mix 75/25 KwikPen (75-25) 100 UNIT/ML Kwikpen Generic drug: Insulin Lispro Prot & Lispro Inject 25-30 Units into the skin 2 (two) times daily with a meal.   hydrOXYzine 25 MG tablet Commonly known as: ATARAX/VISTARIL Take 25 mg by mouth daily.   metoprolol succinate 25 MG 24 hr tablet Commonly known as: TOPROL-XL Take 1 tablet (25 mg total) by mouth daily. What changed: how much to take   oxyCODONE 15 MG immediate release tablet Commonly known as: ROXICODONE Take 1 tablet (15 mg total) by mouth daily as needed for pain.   pantoprazole 40 MG tablet Commonly known as: PROTONIX Take 1 tablet (40 mg total) by mouth daily.   pramipexole 0.5 MG tablet Commonly known as: MIRAPEX Take 0.5 mg by mouth daily.   zinc  sulfate 220 (50 Zn) MG capsule Take 1 capsule (220 mg total) by mouth daily.      Follow-up Information    Emilie Rutter, [facility]. Go to.   Why: Dialysis sessions will occur Tuesday, Thursday, and Saturday. Please arrive at 11:45 AM. Contact information: [place] [address] [phone]          No Known Allergies  Consultations:  Nephrology   GI    Procedures/Studies: Dg Chest 1 View  Result Date: 10/11/2018 CLINICAL DATA:  Chest tightness EXAM: CHEST  1 VIEW COMPARISON:  10/09/2018, 09/27/2018 FINDINGS: Right-sided central venous catheter tip over the right atrium. Cardiomegaly with vascular congestion. Improved aeration within both lung bases. Possible tiny right effusion. No pneumothorax. IMPRESSION: 1. Enlarged cardiomediastinal silhouette with vascular congestion 2. Improved aeration of the lung bases with decreased hazy and interstitial opacities. Electronically Signed   By: Donavan Foil M.D.   On: 10/11/2018 19:37   Ct Head Wo Contrast  Result Date: 09/18/2018 CLINICAL DATA:  Altered mental status (AMS), unclear cause EXAM: CT HEAD WITHOUT CONTRAST TECHNIQUE: Contiguous axial images were obtained from the base of the skull through the vertex without intravenous contrast. COMPARISON:  CT of the head on 07/07/2017 FINDINGS: Brain: Study quality is degraded by patient motion artifact There is no evidence for hemorrhage, mass lesion, or acute infarction. Vascular: There is dense atherosclerotic calcification of the internal carotid arteries. No hyperdense vessels. Skull: Normal. Negative for fracture or focal  lesion. Sinuses/Orbits: No acute finding. Other: None IMPRESSION: 1. No evidence for acute intracranial abnormality. 2. Significant atherosclerotic calcification of the internal carotid arteries. Electronically Signed   By: Nolon Nations M.D.   On: 09/18/2018 15:20   Ct Abdomen Pelvis W Contrast  Result Date: 10/13/2018 CLINICAL DATA:   53 year old female with unexplained anemia. COVID-19 positive. Patient with end-stage renal disease. EXAM: CT ABDOMEN AND PELVIS WITH CONTRAST TECHNIQUE: Multidetector CT imaging of the abdomen and pelvis was performed using the standard protocol following bolus administration of intravenous contrast. CONTRAST:  152mL OMNIPAQUE IOHEXOL 300 MG/ML  SOLN COMPARISON:  07/19/2011 FINDINGS: Lower chest: Mild bibasilar ground-glass opacities noted. Cardiomegaly is present. Hepatobiliary: The liver and gallbladder are unremarkable. No biliary dilatation. Pancreas: Unremarkable Spleen: 3 indeterminate hypodense lesions within the spleen are noted, 2 measuring 5 cm and the largest anteriorly measuring 6.5 cm. These lesions measure in the 40s of Hounsfield units and are new since 07/19/2011. Adrenals/Urinary Tract: Bilateral renal atrophy identified. A 2.5 cm mass extending off of the posterior mid-UPPER RIGHT kidney (series 3: Image 36) measures 36 Hounsfield units. Other very small indeterminate bilateral renal lesions are present. No evidence of hydronephrosis. Heavy vascular calcifications are identified. The adrenal glands and bladder are unremarkable. Stomach/Bowel: Stomach is within normal limits. Appendix appears normal. No evidence of bowel wall thickening, distention, or inflammatory changes. Vascular/Lymphatic: UPPER limits normal retroperitoneal and pelvic sidewall lymph nodes are unchanged from 2013. Aortic atherosclerotic calcifications noted without aortic aneurysm. No new or suspicious lymph nodes are identified. Reproductive: Multiple fibroids are again noted including a 6.7 cm calcified fibroid. A lipoma is now identified within the uterus. Other: A moderate low pelvic midline ventral hernia containing bowel loops is again noted without evidence of bowel obstruction. No ascites, pneumoperitoneum or focal collection. Musculoskeletal: No acute or suspicious bony abnormalities. Mild-to-moderate multilevel  degenerative disc disease in the lumbar spine noted. IMPRESSION: 1. Three indeterminate splenic lesions, 2 measuring 5 cm and 1 measuring 6.5 cm. Differential includes multiple benign etiologies but lymphoma or metastasis are not excluded. 2. Indeterminate 2.5 cm RIGHT renal mass, but suspicious for solid mass/renal cell carcinoma. 3. Mild bilateral ground-glass opacities which may represent infection in this patient with known COVID-19. 4. Stable low pelvic midline ventral hernia containing bowel loops. No evidence of bowel obstruction. 5. Cardiomegaly and Aortic Atherosclerosis (ICD10-I70.0). Electronically Signed   By: Margarette Canada M.D.   On: 10/13/2018 20:30   Dg Chest Portable 1 View  Result Date: 10/09/2018 CLINICAL DATA:  chest pressure, starting about 2 hours ago. Pt states she was treated for pneumonia but still has cough and congestion.Was admitted at [facility] with covid within the past 2 weeks. EXAM: PORTABLE CHEST 1 VIEW COMPARISON:  Chest radiograph 09/27/2018 FINDINGS: Stable cardiomediastinal contours with enlarged heart size. Right central venous catheter tip projects over the right atrium. Central venous congestion. There are persistent predominantly lower lobe heterogeneous opacities. No pneumothorax or large pleural effusion. No acute abnormality in the visualized skeleton. IMPRESSION: Persistent bilateral lower lung opacities could represent infection and/or edema. Electronically Signed   By: Audie Pinto M.D.   On: 10/09/2018 18:05   Dg Chest Port 1 View  Result Date: 09/27/2018 CLINICAL DATA:  COVID-19.  Cough. EXAM: PORTABLE CHEST 1 VIEW COMPARISON:  09/26/2018 FINDINGS: Stable position of the right jugular dialysis catheter with the tip in the upper right atrium. Heart size remains enlarged. Upper lungs are clear. Hazy densities in the right lower lung are again noted. Slightly prominent  central vascular structures are unchanged. IMPRESSION: 1. Stable chest radiograph findings.  Persistent densities in the right lower chest could represent asymmetric edema versus subtle airspace disease. 2. Stable cardiomegaly. 3. Dialysis catheter is stable with the tip in the right atrium. Electronically Signed   By: Markus Daft M.D.   On: 09/27/2018 08:52   Dg Chest Port 1 View  Result Date: 09/26/2018 CLINICAL DATA:  Dialysis catheter placement EXAM: PORTABLE CHEST 1 VIEW COMPARISON:  September 18, 2018 FINDINGS: Central catheter tip is in the right atrium. No pneumothorax. There is cardiomegaly with pulmonary venous hypertension. There is patchy airspace opacity in both lower lobes with small pleural effusions bilaterally. There is slight interstitial edema. No adenopathy. No bone lesions. IMPRESSION: Central catheter tip in right atrium. No pneumothorax. There is pulmonary vascular congestion with mild interstitial edema. Small pleural effusions noted. Airspace opacity in the bases is likely due to alveolar edema, although there may be superimposed pneumonia in the bases, particularly on the left. Note that both pneumonia and alveolar edema may present concurrently. Electronically Signed   By: Lowella Grip III M.D.   On: 09/26/2018 14:48   Dg Chest Port 1 View  Result Date: 09/18/2018 CLINICAL DATA:  Altered mental status. COVID-19 positive. EXAM: PORTABLE CHEST 1 VIEW COMPARISON:  07/09/2017. FINDINGS: Stable enlarged cardiac silhouette. Stable prominence of the interstitial markings. Patchy density in the left lower lung zone with improvement. Resolved patchy opacity at the right lung base. Unremarkable bones. IMPRESSION: 1. Patchy atelectasis or pneumonia at the left lung base, improved compared to the previous examination. 2. Stable cardiomegaly and mild chronic interstitial lung disease. Electronically Signed   By: Claudie Revering M.D.   On: 09/18/2018 11:36   Dg Fluoro Guide Cv Line-no Report  Result Date: 09/26/2018 Fluoroscopy was utilized by the requesting physician.  No  radiographic interpretation.      Procedures:   Subjective: Patient is feeling better, dyspnea has improved, no nausea or vomiting, patient has no melena or hematochezia.   Discharge Exam: Vitals:   10/14/18 0800 10/14/18 0830  BP: (!) 165/78 (!) 169/71  Pulse: 72 75  Resp: 18 17  Temp:    SpO2:     Vitals:   10/14/18 0730 10/14/18 0735 10/14/18 0800 10/14/18 0830  BP: (!) 154/83 (!) 162/78 (!) 165/78 (!) 169/71  Pulse: 76 78 72 75  Resp: 18 17 18 17   Temp:      TempSrc:      SpO2: 96%     Weight:      Height:        General: Not in pain or dyspnea Neurology: Awake and alert, non focal  E ENT: mild pallor, no icterus, oral mucosa moist Cardiovascular: No JVD. S1-S2 present, rhythmic, no gallops, rubs, or murmurs. No lower extremity edema. Pulmonary:  Positive breath sounds bilaterally, adequate air movement, no wheezing, rhonchi or rales. Gastrointestinal. Abdomen with no organomegaly, non tender, no rebound or guarding Skin. No rashes Musculoskeletal: no joint deformities   The results of significant diagnostics from this hospitalization (including imaging, microbiology, ancillary and laboratory) are listed below for reference.     Microbiology: Recent Results (from the past 240 hour(s))  SARS Coronavirus 2 [facility] order, Performed in [facility] hospital lab) Nasopharyngeal Nasopharyngeal Swab     Status: Abnormal   Collection Time: 10/09/18  7:06 PM   Specimen: Nasopharyngeal Swab  Result Value Ref Range Status   SARS Coronavirus 2 POSITIVE (A) NEGATIVE Final    Comment:  RESULT CALLED TO, READ BACK BY AND VERIFIED WITH: RN S CRUZ @2150  10/09/18 BY S GEZAHEGN (NOTE) If result is NEGATIVE SARS-CoV-2 target nucleic acids are NOT DETECTED. The SARS-CoV-2 RNA is generally detectable in upper and lower  respiratory specimens during the acute phase of infection. The lowest  concentration of SARS-CoV-2 viral copies this assay can detect is 250  copies / mL. A  negative result does not preclude SARS-CoV-2 infection  and should not be used as the sole basis for treatment or other  patient management decisions.  A negative result may occur with  improper specimen collection / handling, submission of specimen other  than nasopharyngeal swab, presence of viral mutation(s) within the  areas targeted by this assay, and inadequate number of viral copies  (<250 copies / mL). A negative result must be combined with clinical  observations, patient history, and epidemiological information. If result is POSITIVE SARS-CoV-2 target nucleic acids are DETECTED.  The SARS-CoV-2 RNA is generally detectable in upper and lower  respiratory specimens during the acute phase of infection.  Positive  results are indicative of active infection with SARS-CoV-2.  Clinical  correlation with patient history and other diagnostic information is  necessary to determine patient infection status.  Positive results do  not rule out bacterial infection or co-infection with other viruses. If result is PRESUMPTIVE POSTIVE SARS-CoV-2 nucleic acids MAY BE PRESENT.   A presumptive positive result was obtained on the submitted specimen  and confirmed on repeat testing.  While 2019 novel coronavirus  (SARS-CoV-2) nucleic acids may be present in the submitted sample  additional confirmatory testing may be necessary for epidemiological  and / or clinical management purposes  to differentiate between  SARS-CoV-2 and other Sarbecovirus currently known to infect humans.  If clinically indicated additional testing with an alternate test  methodology [phone]) i s advised. The SARS-CoV-2 RNA is generally  detectable in upper and lower respiratory specimens during the acute  phase of infection. The expected result is Negative. Fact Sheet for Patients:  StrictlyIdeas.no Fact Sheet for Healthcare Providers: BankingDealers.co.za This test is not  yet approved or cleared by the [place] FDA and has been authorized for detection and/or diagnosis of SARS-CoV-2 by FDA under an Emergency Use Authorization (EUA).  This EUA will remain in effect (meaning this test can be used) for the duration of the COVID-19 declaration under Section 564(b)(1) of the Act, 21 U.S.C. section 360bbb-3(b)(1), unless the authorization is terminated or revoked sooner. Performed at [facility], [place] [address] [place], [place] [address]      Labs: BNP (last 3 results) Recent Labs    09/18/18 1227  BNP 338.2*   Basic Metabolic Panel: Recent Labs  Lab 10/09/18 1725 10/09/18 1748 10/10/18 0003 10/10/18 1230 10/12/18 0442 10/13/18 0544 10/14/18 0600  NA 140 139  --  139 132* 133* 130*  K 3.6 3.5  --  5.1 4.6 3.8 4.3  CL 95*  --   --  95* 94* 97* 93*  CO2 29  --   --  24 21* 22 19*  GLUCOSE 273*  --   --  274* 193* 200* 202*  BUN 36*  --   --  46* 112* 66* 84*  CREATININE 7.75*  --  8.31* 8.57* 7.43* 5.51* 7.12*  CALCIUM 9.1  --   --  8.7* 8.9 8.9 9.0  PHOS  --   --   --  5.8*  --   --   --  Liver Function Tests: Recent Labs  Lab 10/10/18 0003 10/10/18 1230  AST 15  --   ALT 11  --   ALKPHOS 82  --   BILITOT 0.9  --   PROT 7.4  --   ALBUMIN 2.9* 3.0*   No results for input(s): LIPASE, AMYLASE in the last 168 hours. Recent Labs  Lab 10/10/18 0003  AMMONIA 28   CBC: Recent Labs  Lab 10/09/18 1725  10/10/18 1230 10/11/18 0449 10/12/18 0442 10/13/18 0544 10/13/18 1703 10/14/18 0600  WBC 7.7   < > 10.1 8.6 8.5 8.1  --  8.6  NEUTROABS 5.6  --   --   --  5.3 5.5  --  5.8  HGB 7.0*   < > 7.1* 6.1* 5.9* 7.1* 7.8* 7.4*  HCT 22.7*   < > 23.4* 20.0* 18.3* 21.5* 23.1* 22.7*  MCV 100.9*   < > 102.2* 100.0 95.8 93.1  --  93.8  PLT 192   < > 190 202 189 186  --  197   < > = values in this interval not displayed.   Cardiac Enzymes: No results for input(s): CKTOTAL, CKMB, CKMBINDEX, TROPONINI in the last 168  hours. BNP: Invalid input(s): POCBNP CBG: Recent Labs  Lab 10/13/18 0801 10/13/18 1128 10/13/18 1601 10/13/18 2153 10/14/18 0834  GLUCAP 181* 181* 219* 183* 165*   D-Dimer No results for input(s): DDIMER in the last 72 hours. Hgb A1c No results for input(s): HGBA1C in the last 72 hours. Lipid Profile No results for input(s): CHOL, HDL, LDLCALC, TRIG, CHOLHDL, LDLDIRECT in the last 72 hours. Thyroid function studies No results for input(s): TSH, T4TOTAL, T3FREE, THYROIDAB in the last 72 hours.  Invalid input(s): FREET3 Anemia work up No results for input(s): VITAMINB12, FOLATE, FERRITIN, TIBC, IRON, RETICCTPCT in the last 72 hours. Urinalysis    Component Value Date/Time   COLORURINE YELLOW 07/22/2011 1526   APPEARANCEUR CLOUDY (A) 07/22/2011 1526   LABSPEC 1.022 07/22/2011 1526   PHURINE 5.5 07/22/2011 1526   GLUCOSEU 100 (A) 07/22/2011 1526   HGBUR MODERATE (A) 07/22/2011 1526   BILIRUBINUR NEGATIVE 07/22/2011 1526   BILIRUBINUR NEG 07/19/2011 1202   KETONESUR NEGATIVE 07/22/2011 1526   PROTEINUR >300 (A) 07/22/2011 1526   UROBILINOGEN 0.2 07/22/2011 1526   NITRITE NEGATIVE 07/22/2011 1526   LEUKOCYTESUR TRACE (A) 07/22/2011 1526   Sepsis Labs Invalid input(s): PROCALCITONIN,  WBC,  LACTICIDVEN Microbiology Recent Results (from the past 240 hour(s))  SARS Coronavirus 2 [facility] order, Performed in [place] hospital lab) Nasopharyngeal Nasopharyngeal Swab     Status: Abnormal   Collection Time: 10/09/18  7:06 PM   Specimen: Nasopharyngeal Swab  Result Value Ref Range Status   SARS Coronavirus 2 POSITIVE (A) NEGATIVE Final    Comment: RESULT CALLED TO, READ BACK BY AND VERIFIED WITH: RN S CRUZ @2150  10/09/18 BY S GEZAHEGN (NOTE) If result is NEGATIVE SARS-CoV-2 target nucleic acids are NOT DETECTED. The SARS-CoV-2 RNA is generally detectable in upper and lower  respiratory specimens during the acute phase of infection. The lowest  concentration of SARS-CoV-2  viral copies this assay can detect is 250  copies / mL. A negative result does not preclude SARS-CoV-2 infection  and should not be used as the sole basis for treatment or other  patient management decisions.  A negative result may occur with  improper specimen collection / handling, submission of specimen other  than nasopharyngeal swab, presence of viral mutation(s) within the  areas targeted by this  assay, and inadequate number of viral copies  (<250 copies / mL). A negative result must be combined with clinical  observations, patient history, and epidemiological information. If result is POSITIVE SARS-CoV-2 target nucleic acids are DETECTED.  The SARS-CoV-2 RNA is generally detectable in upper and lower  respiratory specimens during the acute phase of infection.  Positive  results are indicative of active infection with SARS-CoV-2.  Clinical  correlation with patient history and other diagnostic information is  necessary to determine patient infection status.  Positive results do  not rule out bacterial infection or co-infection with other viruses. If result is PRESUMPTIVE POSTIVE SARS-CoV-2 nucleic acids MAY BE PRESENT.   A presumptive positive result was obtained on the submitted specimen  and confirmed on repeat testing.  While 2019 novel coronavirus  (SARS-CoV-2) nucleic acids may be present in the submitted sample  additional confirmatory testing may be necessary for epidemiological  and / or clinical management purposes  to differentiate between  SARS-CoV-2 and other Sarbecovirus currently known to infect humans.  If clinically indicated additional testing with an alternate test  methodology [phone]) i s advised. The SARS-CoV-2 RNA is generally  detectable in upper and lower respiratory specimens during the acute  phase of infection. The expected result is Negative. Fact Sheet for Patients:  StrictlyIdeas.no Fact Sheet for Healthcare  Providers: BankingDealers.co.za This test is not yet approved or cleared by the [place] FDA and has been authorized for detection and/or diagnosis of SARS-CoV-2 by FDA under an Emergency Use Authorization (EUA).  This EUA will remain in effect (meaning this test can be used) for the duration of the COVID-19 declaration under Section 564(b)(1) of the Act, 21 U.S.C. section 360bbb-3(b)(1), unless the authorization is terminated or revoked sooner. Performed at [facility], [place] [address] [place], [place] [address]      Time coordinating discharge: 45 minutes  SIGNED:   Tawni Millers, MD  [facility] 10/14/2018, 9:02 AM

## 2018-10-14 NOTE — Progress Notes (Signed)
Patient ID: [name], female   DOB: July 02, 1965, 53 y.o.   MRN: [id number]  [facility] Progress Note   Assessment/ Plan:   1.  Shortness of breath: Appears to be bi-factorial from volume overload/symptomatic anemia in the setting of resolving COVID-19 pneumonia.  Improved status post correction of anemia and with hemodialysis/UF.  Discussed need to restrict IDWG 2. ESRD: On TTS hemodialysis schedule with hemodialysis at this time.  Appears to be stable enough to discharge later today. 3. Anemia: Overnight with stable hemoglobin/hematocrit following PRBC transfusion.  CT scan of the abdomen does not show any intra-abdominal/retroperitoneal bleeding or significant intestinal lesions.  Recently done LDH levels only marginally elevated. 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 5. Nutrition: Continue renal diet/renal multivitamin and oral nutritional supplementation. 6. Hypertension: Blood pressures appear satisfactorily controlled, continue to monitor with HD/UF/medications 7.  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 8.  Indeterminate right renal mass: Raising concern for solid mass/RCC based on radiology report-she will need outpatient referral to urology for additional management.  Subjective:   Overnight events noted, CT scan results of the abdomen/pelvis reviewed.   Objective:   BP (!) 169/71   Pulse 75   Temp 97.8 F (36.6 C) (Oral)   Resp 17   Ht 5\' 8"  (1.727 m)   Wt 113.9 kg   LMP  (LMP Unknown)   SpO2 96%   BMI 38.18 kg/m   Physical Exam: Gen: Comfortably sleeping on dialysis-awakens with difficulty CVS: Pulse regular rhythm, normal rate, S1 and S2 normal Resp: Diminished breath sounds over bases without rales or rhonchi Abd: Soft, obese, nontender Ext: No lower extremity edema  Labs: BMET Recent Labs  Lab 10/09/18 1725 10/09/18 1748 10/10/18 0003  10/10/18 1230 10/12/18 0442 10/13/18 0544 10/14/18 0600  NA 140 139  --  139 132* 133* 130*  K 3.6 3.5  --  5.1 4.6 3.8 4.3  CL 95*  --   --  95* 94* 97* 93*  CO2 29  --   --  24 21* 22 19*  GLUCOSE 273*  --   --  274* 193* 200* 202*  BUN 36*  --   --  46* 112* 66* 84*  CREATININE 7.75*  --  8.31* 8.57* 7.43* 5.51* 7.12*  CALCIUM 9.1  --   --  8.7* 8.9 8.9 9.0  PHOS  --   --   --  5.8*  --   --   --    CBC Recent Labs  Lab 10/09/18 1725  10/11/18 0449 10/12/18 0442 10/13/18 0544 10/13/18 1703 10/14/18 0600  WBC 7.7   < > 8.6 8.5 8.1  --  8.6  NEUTROABS 5.6  --   --  5.3 5.5  --  5.8  HGB 7.0*   < > 6.1* 5.9* 7.1* 7.8* 7.4*  HCT 22.7*   < > 20.0* 18.3* 21.5* 23.1* 22.7*  MCV 100.9*   < > 100.0 95.8 93.1  --  93.8  PLT 192   < > 202 189 186  --  197   < > = values in this interval not displayed.   Medications:    . sodium chloride   Intravenous Once  . atorvastatin  80 mg Oral q1800  . calcitRIOL      . calcitRIOL  1.5 mcg Oral Q T,Th,Sa-HD  . Chlorhexidine Gluconate Cloth  6 each Topical Q0600  . darbepoetin (ARANESP) injection -  DIALYSIS  200 mcg Intravenous Q Sat-HD  . feeding supplement (NEPRO CARB STEADY)  237 mL Oral BID BM  . guaiFENesin-dextromethorphan  5 mL Oral Q6H  . heparin      . heparin  3.4 mL Intravenous Once  . heparin  7,500 Units Subcutaneous Q8H  . insulin aspart  0-9 Units Subcutaneous TID WC  . insulin aspart protamine- aspart  25 Units Subcutaneous BID WC  . metoprolol succinate  25 mg Oral Daily  . multivitamin  1 tablet Oral QHS  . pantoprazole  40 mg Oral Daily   Elmarie Shiley, MD 10/14/2018, 9:01 AM

## 2018-10-17 ENCOUNTER — Other Ambulatory Visit: Payer: Self-pay

## 2018-10-17 DIAGNOSIS — N186 End stage renal disease: Secondary | ICD-10-CM

## 2018-10-17 DIAGNOSIS — Z992 Dependence on renal dialysis: Secondary | ICD-10-CM

## 2018-10-18 ENCOUNTER — Encounter (HOSPITAL_COMMUNITY): Payer: Medicare Other

## 2018-10-23 ENCOUNTER — Inpatient Hospital Stay (HOSPITAL_COMMUNITY): Payer: Medicare Other

## 2018-10-23 ENCOUNTER — Encounter (HOSPITAL_COMMUNITY): Payer: Self-pay | Admitting: Emergency Medicine

## 2018-10-23 ENCOUNTER — Emergency Department (HOSPITAL_COMMUNITY): Payer: Medicare Other

## 2018-10-23 ENCOUNTER — Inpatient Hospital Stay (HOSPITAL_COMMUNITY)
Admission: EM | Admit: 2018-10-23 | Discharge: 2018-10-25 | DRG: 291 | Disposition: A | Discharge status: Home / Self Care | Payer: Medicare Other | Attending: Internal Medicine | Admitting: Internal Medicine

## 2018-10-23 ENCOUNTER — Other Ambulatory Visit: Payer: Self-pay

## 2018-10-23 DIAGNOSIS — I132 Hypertensive heart and chronic kidney disease with heart failure and with stage 5 chronic kidney disease, or end stage renal disease: Principal | ICD-10-CM | POA: Diagnosis present

## 2018-10-23 DIAGNOSIS — I10 Essential (primary) hypertension: Secondary | ICD-10-CM | POA: Diagnosis present

## 2018-10-23 DIAGNOSIS — E8889 Other specified metabolic disorders: Secondary | ICD-10-CM | POA: Diagnosis present

## 2018-10-23 DIAGNOSIS — Z09 Encounter for follow-up examination after completed treatment for conditions other than malignant neoplasm: Secondary | ICD-10-CM

## 2018-10-23 DIAGNOSIS — I152 Hypertension secondary to endocrine disorders: Secondary | ICD-10-CM | POA: Diagnosis present

## 2018-10-23 DIAGNOSIS — I161 Hypertensive emergency: Secondary | ICD-10-CM | POA: Diagnosis present

## 2018-10-23 DIAGNOSIS — IMO0002 Reserved for concepts with insufficient information to code with codable children: Secondary | ICD-10-CM

## 2018-10-23 DIAGNOSIS — Z8349 Family history of other endocrine, nutritional and metabolic diseases: Secondary | ICD-10-CM

## 2018-10-23 DIAGNOSIS — F17210 Nicotine dependence, cigarettes, uncomplicated: Secondary | ICD-10-CM | POA: Diagnosis present

## 2018-10-23 DIAGNOSIS — E8770 Fluid overload, unspecified: Secondary | ICD-10-CM

## 2018-10-23 DIAGNOSIS — N2889 Other specified disorders of kidney and ureter: Secondary | ICD-10-CM | POA: Diagnosis present

## 2018-10-23 DIAGNOSIS — Z720 Tobacco use: Secondary | ICD-10-CM

## 2018-10-23 DIAGNOSIS — Z794 Long term (current) use of insulin: Secondary | ICD-10-CM

## 2018-10-23 DIAGNOSIS — I340 Nonrheumatic mitral (valve) insufficiency: Secondary | ICD-10-CM | POA: Diagnosis present

## 2018-10-23 DIAGNOSIS — E7800 Pure hypercholesterolemia, unspecified: Secondary | ICD-10-CM | POA: Diagnosis present

## 2018-10-23 DIAGNOSIS — J9601 Acute respiratory failure with hypoxia: Secondary | ICD-10-CM | POA: Diagnosis present

## 2018-10-23 DIAGNOSIS — Z833 Family history of diabetes mellitus: Secondary | ICD-10-CM

## 2018-10-23 DIAGNOSIS — E1152 Type 2 diabetes mellitus with diabetic peripheral angiopathy with gangrene: Secondary | ICD-10-CM | POA: Diagnosis present

## 2018-10-23 DIAGNOSIS — R0603 Acute respiratory distress: Secondary | ICD-10-CM | POA: Diagnosis present

## 2018-10-23 DIAGNOSIS — E118 Type 2 diabetes mellitus with unspecified complications: Secondary | ICD-10-CM | POA: Diagnosis not present

## 2018-10-23 DIAGNOSIS — Z992 Dependence on renal dialysis: Secondary | ICD-10-CM | POA: Diagnosis not present

## 2018-10-23 DIAGNOSIS — Z823 Family history of stroke: Secondary | ICD-10-CM

## 2018-10-23 DIAGNOSIS — I371 Nonrheumatic pulmonary valve insufficiency: Secondary | ICD-10-CM | POA: Diagnosis not present

## 2018-10-23 DIAGNOSIS — I2510 Atherosclerotic heart disease of native coronary artery without angina pectoris: Secondary | ICD-10-CM

## 2018-10-23 DIAGNOSIS — J90 Pleural effusion, not elsewhere classified: Secondary | ICD-10-CM | POA: Insufficient documentation

## 2018-10-23 DIAGNOSIS — Z79891 Long term (current) use of opiate analgesic: Secondary | ICD-10-CM

## 2018-10-23 DIAGNOSIS — G2581 Restless legs syndrome: Secondary | ICD-10-CM | POA: Diagnosis present

## 2018-10-23 DIAGNOSIS — Z6835 Body mass index (BMI) 35.0-35.9, adult: Secondary | ICD-10-CM | POA: Diagnosis not present

## 2018-10-23 DIAGNOSIS — E1169 Type 2 diabetes mellitus with other specified complication: Secondary | ICD-10-CM | POA: Diagnosis present

## 2018-10-23 DIAGNOSIS — E1165 Type 2 diabetes mellitus with hyperglycemia: Secondary | ICD-10-CM | POA: Diagnosis present

## 2018-10-23 DIAGNOSIS — E1122 Type 2 diabetes mellitus with diabetic chronic kidney disease: Secondary | ICD-10-CM | POA: Diagnosis present

## 2018-10-23 DIAGNOSIS — R0602 Shortness of breath: Secondary | ICD-10-CM | POA: Insufficient documentation

## 2018-10-23 DIAGNOSIS — E785 Hyperlipidemia, unspecified: Secondary | ICD-10-CM | POA: Diagnosis present

## 2018-10-23 DIAGNOSIS — U071 COVID-19: Secondary | ICD-10-CM | POA: Diagnosis present

## 2018-10-23 DIAGNOSIS — J1289 Other viral pneumonia: Secondary | ICD-10-CM | POA: Diagnosis present

## 2018-10-23 DIAGNOSIS — E1159 Type 2 diabetes mellitus with other circulatory complications: Secondary | ICD-10-CM | POA: Diagnosis present

## 2018-10-23 DIAGNOSIS — N2581 Secondary hyperparathyroidism of renal origin: Secondary | ICD-10-CM | POA: Diagnosis present

## 2018-10-23 DIAGNOSIS — I5033 Acute on chronic diastolic (congestive) heart failure: Secondary | ICD-10-CM | POA: Diagnosis present

## 2018-10-23 DIAGNOSIS — D649 Anemia, unspecified: Secondary | ICD-10-CM | POA: Diagnosis present

## 2018-10-23 DIAGNOSIS — N186 End stage renal disease: Secondary | ICD-10-CM | POA: Diagnosis present

## 2018-10-23 DIAGNOSIS — Z8249 Family history of ischemic heart disease and other diseases of the circulatory system: Secondary | ICD-10-CM

## 2018-10-23 DIAGNOSIS — Z79899 Other long term (current) drug therapy: Secondary | ICD-10-CM

## 2018-10-23 DIAGNOSIS — I5031 Acute diastolic (congestive) heart failure: Secondary | ICD-10-CM | POA: Diagnosis not present

## 2018-10-23 DIAGNOSIS — D631 Anemia in chronic kidney disease: Secondary | ICD-10-CM | POA: Diagnosis present

## 2018-10-23 DIAGNOSIS — E669 Obesity, unspecified: Secondary | ICD-10-CM | POA: Diagnosis present

## 2018-10-23 DIAGNOSIS — J9600 Acute respiratory failure, unspecified whether with hypoxia or hypercapnia: Secondary | ICD-10-CM

## 2018-10-23 HISTORY — DX: Acute respiratory distress: R06.03

## 2018-10-23 HISTORY — DX: Pleural effusion, not elsewhere classified: J90

## 2018-10-23 LAB — I-STAT CHEM 8, ED
BUN: 35 mg/dL — ABNORMAL HIGH (ref 6–20)
Calcium, Ion: 1.13 mmol/L — ABNORMAL LOW (ref 1.15–1.40)
Chloride: 96 mmol/L — ABNORMAL LOW (ref 98–111)
Creatinine, Ser: 6.3 mg/dL — ABNORMAL HIGH (ref 0.44–1.00)
Glucose, Bld: 370 mg/dL — ABNORMAL HIGH (ref 70–99)
HCT: 26 % — ABNORMAL LOW (ref 36.0–46.0)
Hemoglobin: 8.8 g/dL — ABNORMAL LOW (ref 12.0–15.0)
Potassium: 4.3 mmol/L (ref 3.5–5.1)
Sodium: 136 mmol/L (ref 135–145)
TCO2: 27 mmol/L (ref 22–32)

## 2018-10-23 LAB — POCT I-STAT EG7
Acid-Base Excess: 3 mmol/L — ABNORMAL HIGH (ref 0.0–2.0)
Bicarbonate: 28.6 mmol/L — ABNORMAL HIGH (ref 20.0–28.0)
Calcium, Ion: 1.14 mmol/L — ABNORMAL LOW (ref 1.15–1.40)
HCT: 27 % — ABNORMAL LOW (ref 36.0–46.0)
Hemoglobin: 9.2 g/dL — ABNORMAL LOW (ref 12.0–15.0)
O2 Saturation: 65 %
Potassium: 4.3 mmol/L (ref 3.5–5.1)
Sodium: 137 mmol/L (ref 135–145)
TCO2: 30 mmol/L (ref 22–32)
pCO2, Ven: 45.7 mmHg (ref 44.0–60.0)
pH, Ven: 7.405 (ref 7.250–7.430)
pO2, Ven: 34 mmHg (ref 32.0–45.0)

## 2018-10-23 LAB — BASIC METABOLIC PANEL
Anion gap: 15 (ref 5–15)
BUN: 36 mg/dL — ABNORMAL HIGH (ref 6–20)
CO2: 27 mmol/L (ref 22–32)
Calcium: 9.2 mg/dL (ref 8.9–10.3)
Chloride: 95 mmol/L — ABNORMAL LOW (ref 98–111)
Creatinine, Ser: 6.5 mg/dL — ABNORMAL HIGH (ref 0.44–1.00)
GFR calc Af Amer: 8 mL/min — ABNORMAL LOW (ref >60)
GFR calc non Af Amer: 7 mL/min — ABNORMAL LOW (ref >60)
Glucose, Bld: 378 mg/dL — ABNORMAL HIGH (ref 70–99)
Potassium: 4.4 mmol/L (ref 3.5–5.1)
Sodium: 137 mmol/L (ref 135–145)

## 2018-10-23 LAB — CBC WITH DIFFERENTIAL/PLATELET
Abs Immature Granulocytes: 0.06 10*3/uL (ref 0.00–0.07)
Basophils Absolute: 0 10*3/uL (ref 0.0–0.1)
Basophils Relative: 1 %
Eosinophils Absolute: 0.3 10*3/uL (ref 0.0–0.5)
Eosinophils Relative: 4 %
HCT: 26.6 % — ABNORMAL LOW (ref 36.0–46.0)
Hemoglobin: 8.4 g/dL — ABNORMAL LOW (ref 12.0–15.0)
Immature Granulocytes: 1 %
Lymphocytes Relative: 14 %
Lymphs Abs: 1.2 10*3/uL (ref 0.7–4.0)
MCH: 31.8 pg (ref 26.0–34.0)
MCHC: 31.6 g/dL (ref 30.0–36.0)
MCV: 100.8 fL — ABNORMAL HIGH (ref 80.0–100.0)
Monocytes Absolute: 0.4 10*3/uL (ref 0.1–1.0)
Monocytes Relative: 5 %
Neutro Abs: 6.4 10*3/uL (ref 1.7–7.7)
Neutrophils Relative %: 75 %
Platelets: 193 10*3/uL (ref 150–400)
RBC: 2.64 MIL/uL — ABNORMAL LOW (ref 3.87–5.11)
RDW: 16.8 % — ABNORMAL HIGH (ref 11.5–15.5)
WBC: 8.4 10*3/uL (ref 4.0–10.5)
nRBC: 0 % (ref 0.0–0.2)

## 2018-10-23 LAB — TROPONIN I (HIGH SENSITIVITY)
Troponin I (High Sensitivity): 50 ng/L — ABNORMAL HIGH (ref <18)
Troponin I (High Sensitivity): 49 ng/L — ABNORMAL HIGH (ref <18)
Troponin I (High Sensitivity): 48 ng/L — ABNORMAL HIGH (ref <18)

## 2018-10-23 LAB — BRAIN NATRIURETIC PEPTIDE: B Natriuretic Peptide: 2159.1 pg/mL — ABNORMAL HIGH (ref 0.0–100.0)

## 2018-10-23 LAB — D-DIMER, QUANTITATIVE: D-Dimer, Quant: 2.92 ug/mL-FEU — ABNORMAL HIGH (ref 0.00–0.50)

## 2018-10-23 LAB — TSH: TSH: 2.957 u[IU]/mL (ref 0.350–4.500)

## 2018-10-23 LAB — CBG MONITORING, ED: Glucose-Capillary: 293 mg/dL — ABNORMAL HIGH (ref 70–99)

## 2018-10-23 LAB — SARS CORONAVIRUS 2 BY RT PCR (HOSPITAL ORDER, PERFORMED IN ~~LOC~~ HOSPITAL LAB): SARS Coronavirus 2: POSITIVE — AB

## 2018-10-23 LAB — ECHOCARDIOGRAM COMPLETE
Height: 68 in
Weight: 3844.82 oz

## 2018-10-23 LAB — LACTIC ACID, PLASMA: Lactic Acid, Venous: 1.6 mmol/L (ref 0.5–1.9)

## 2018-10-23 LAB — GLUCOSE, CAPILLARY
Glucose-Capillary: 267 mg/dL — ABNORMAL HIGH (ref 70–99)
Glucose-Capillary: 137 mg/dL — ABNORMAL HIGH (ref 70–99)

## 2018-10-23 LAB — MAGNESIUM: Magnesium: 2.1 mg/dL (ref 1.7–2.4)

## 2018-10-23 LAB — PHOSPHORUS: Phosphorus: 3.8 mg/dL (ref 2.5–4.6)

## 2018-10-23 MED ORDER — HEPARIN (PORCINE) 25000 UT/250ML-% IV SOLN
1200.0000 [IU]/h | INTRAVENOUS | Status: DC
  Administered 2018-10-23: 1200 [IU]/h via INTRAVENOUS
  Filled 2018-10-23: qty 250

## 2018-10-23 MED ORDER — ACETAMINOPHEN 650 MG RE SUPP: 650.0000 mg | Freq: Four times a day (QID) | RECTAL | Status: DC | PRN

## 2018-10-23 MED ORDER — NITROGLYCERIN IN D5W 200-5 MCG/ML-% IV SOLN
0.0000 ug/min | INTRAVENOUS | Status: DC
  Administered 2018-10-23 (×2): 5 ug/min via INTRAVENOUS
  Filled 2018-10-23: qty 250

## 2018-10-23 MED ORDER — METOPROLOL SUCCINATE ER 25 MG PO TB24
25.0000 mg | ORAL_TABLET | Freq: Every day | ORAL | Status: DC
  Administered 2018-10-23 – 2018-10-25 (×2): 25 mg via ORAL
  Filled 2018-10-23 (×2): qty 1

## 2018-10-23 MED ORDER — INSULIN DETEMIR 100 UNIT/ML ~~LOC~~ SOLN
12.0000 [IU] | Freq: Two times a day (BID) | SUBCUTANEOUS | Status: DC
  Administered 2018-10-23 – 2018-10-25 (×5): 12 [IU] via SUBCUTANEOUS
  Filled 2018-10-23 (×6): qty 0.12

## 2018-10-23 MED ORDER — CHLORHEXIDINE GLUCONATE CLOTH 2 % EX PADS
6.0000 | MEDICATED_PAD | Freq: Every day | CUTANEOUS | Status: DC
  Administered 2018-10-24 (×2): 6 via TOPICAL

## 2018-10-23 MED ORDER — ENOXAPARIN SODIUM 30 MG/0.3ML ~~LOC~~ SOLN: 30.0000 mg | SUBCUTANEOUS | Status: DC

## 2018-10-23 MED ORDER — INSULIN ASPART 100 UNIT/ML ~~LOC~~ SOLN: 0.0000 [IU] | Freq: Three times a day (TID) | SUBCUTANEOUS | Status: DC

## 2018-10-23 MED ORDER — PRO-STAT SUGAR FREE PO LIQD
30.0000 mL | Freq: Two times a day (BID) | ORAL | Status: DC
  Administered 2018-10-24 – 2018-10-25 (×3): 30 mL via ORAL
  Filled 2018-10-23 (×3): qty 30

## 2018-10-23 MED ORDER — SODIUM CHLORIDE 0.9 % IV SOLN
1.0000 g | Freq: Once | INTRAVENOUS | Status: AC
  Administered 2018-10-23: 11:00:00 1 g via INTRAVENOUS
  Filled 2018-10-23: qty 10

## 2018-10-23 MED ORDER — FERRIC CITRATE 1 GM 210 MG(FE) PO TABS
630.0000 mg | ORAL_TABLET | Freq: Three times a day (TID) | ORAL | Status: DC
  Administered 2018-10-24 – 2018-10-25 (×5): 630 mg via ORAL
  Filled 2018-10-23 (×8): qty 3

## 2018-10-23 MED ORDER — OXYCODONE HCL 5 MG PO TABS: 15.0000 mg | ORAL_TABLET | Freq: Four times a day (QID) | ORAL | Status: DC | PRN

## 2018-10-23 MED ORDER — RENA-VITE PO TABS
1.0000 | ORAL_TABLET | Freq: Every day | ORAL | Status: DC
  Administered 2018-10-23 – 2018-10-24 (×2): 1 via ORAL
  Filled 2018-10-23 (×2): qty 1

## 2018-10-23 MED ORDER — PANTOPRAZOLE SODIUM 40 MG PO TBEC
40.0000 mg | DELAYED_RELEASE_TABLET | Freq: Every day | ORAL | Status: DC
  Administered 2018-10-24 – 2018-10-25 (×2): 40 mg via ORAL
  Filled 2018-10-23 (×2): qty 1

## 2018-10-23 MED ORDER — IOHEXOL 350 MG/ML SOLN
80.0000 mL | Freq: Once | INTRAVENOUS | Status: AC | PRN
  Administered 2018-10-23: 80 mL via INTRAVENOUS

## 2018-10-23 MED ORDER — HEPARIN SODIUM (PORCINE) 1000 UNIT/ML IJ SOLN
INTRAMUSCULAR | Status: AC
  Administered 2018-10-23: 1000 [IU]
  Filled 2018-10-23: qty 4

## 2018-10-23 MED ORDER — DARBEPOETIN ALFA 200 MCG/0.4ML IJ SOSY
200.0000 ug | PREFILLED_SYRINGE | INTRAMUSCULAR | Status: DC
  Administered 2018-10-24: 200 ug via INTRAVENOUS

## 2018-10-23 MED ORDER — LORAZEPAM 2 MG/ML IJ SOLN
0.5000 mg | Freq: Once | INTRAMUSCULAR | Status: AC
  Administered 2018-10-23: 14:00:00 0.5 mg via INTRAVENOUS
  Filled 2018-10-23: qty 1

## 2018-10-23 MED ORDER — ACETAMINOPHEN 325 MG PO TABS: 650.0000 mg | ORAL_TABLET | Freq: Four times a day (QID) | ORAL | Status: DC | PRN

## 2018-10-23 MED ORDER — ONDANSETRON HCL 4 MG/2ML IJ SOLN: 4.0000 mg | Freq: Four times a day (QID) | INTRAMUSCULAR | Status: DC | PRN

## 2018-10-23 MED ORDER — INSULIN ASPART 100 UNIT/ML ~~LOC~~ SOLN
3.0000 [IU] | Freq: Three times a day (TID) | SUBCUTANEOUS | Status: DC
  Administered 2018-10-23 – 2018-10-25 (×6): 3 [IU] via SUBCUTANEOUS

## 2018-10-23 MED ORDER — CALCITRIOL 0.25 MCG PO CAPS
1.5000 ug | ORAL_CAPSULE | ORAL | Status: DC
  Administered 2018-10-24: 1.5 ug via ORAL
  Filled 2018-10-23: qty 6

## 2018-10-23 MED ORDER — HEPARIN BOLUS VIA INFUSION
3500.0000 [IU] | Freq: Once | INTRAVENOUS | Status: AC
  Administered 2018-10-23: 3500 [IU] via INTRAVENOUS
  Filled 2018-10-23: qty 3500

## 2018-10-23 MED ORDER — ONDANSETRON HCL 4 MG PO TABS: 4.0000 mg | ORAL_TABLET | Freq: Four times a day (QID) | ORAL | Status: DC | PRN

## 2018-10-23 MED ORDER — INSULIN ASPART 100 UNIT/ML ~~LOC~~ SOLN: 0.0000 [IU] | Freq: Every day | SUBCUTANEOUS | Status: DC

## 2018-10-23 MED ORDER — LABETALOL HCL 5 MG/ML IV SOLN
10.0000 mg | Freq: Four times a day (QID) | INTRAVENOUS | Status: DC | PRN
  Administered 2018-10-24: 10 mg via INTRAVENOUS
  Filled 2018-10-23: qty 4

## 2018-10-23 MED ORDER — SODIUM CHLORIDE 0.9 % IV SOLN
500.0000 mg | Freq: Once | INTRAVENOUS | Status: AC
  Administered 2018-10-23: 12:00:00 500 mg via INTRAVENOUS
  Filled 2018-10-23: qty 500

## 2018-10-23 MED ORDER — DEXAMETHASONE 4 MG PO TABS
6.0000 mg | ORAL_TABLET | Freq: Every day | ORAL | Status: DC
  Administered 2018-10-23 – 2018-10-24 (×2): 6 mg via ORAL
  Filled 2018-10-23 (×2): qty 2

## 2018-10-23 MED ORDER — ATORVASTATIN CALCIUM 80 MG PO TABS
80.0000 mg | ORAL_TABLET | Freq: Every day | ORAL | Status: DC
  Administered 2018-10-24: 80 mg via ORAL
  Filled 2018-10-23: qty 1

## 2018-10-23 NOTE — Progress Notes (Signed)
Echocardiogram 2D Echocardiogram has been performed.  Oneal Deputy Rainy Rothman 10/23/2018, 3:52 PM

## 2018-10-23 NOTE — H&P (Addendum)
History and Physical    [name] BZJ:[id number] DOB: 08-10-1965 DOA: 10/23/2018  PCP: Catherine Mariscal, MD  Patient coming from: Home  I have personally briefly reviewed patient's old medical records in [place]  Chief Complaint: Acute onset of shortness of breath started this morning  HPI: [name] is a 53 y.o. female with medical history significant of end-stage renal disease on hemodialysis on Tuesday, Thursday, Saturday, uncontrolled insulin-dependent type 2 diabetes mellitus, uncontrolled hypertension, anemia of chronic disease, nonobstructive coronary artery disease, hyperlipidemia presents to emergency department due to worsening shortness of breath which started at 3:00 this morning while she was sleeping.  Patient denies association with orthopnea, PND, chest pain, palpitation or leg swelling.  She recently admitted in the hospital on 10/09/2018 due to acute metabolic encephalopathy and COVID-19 infection.  Patient denies fever, chills, cough, congestion, runny nose, sore throat, chest congestion, lightheadedness, dizziness, decreased appetite, sleep or bowel changes.  She reports headache however denies association with blurry vision, slurred speech, loss of consciousness.  She reports that she is compliant with her dialysis appointment and her medications.  She does not check her blood sugar as she lost her monitor couple of months ago.  She is on 75/25 insulin 30 units twice daily.  Reports being compliant with insulin regimen.  Has history of gangrene of the left thumb being followed by vascular surgeon.  Had a ligation done of the AV fistula for steal phenomenon.  She has temporary dialysis catheter on right side of her upper chest.  Patient lives with her husband and son, use cane for ambulation.  Smokes half pack of cigarettes per day, denies alcohol, illicit drug use.  ED Course: Patient was found to have elevated blood pressure, elevated proBNP, elevated d-dimer,  elevated troponin level.  Chest x-ray shows cardiomegaly with increasing interstitial edema and bilateral pleural effusion consistent with progressive congestive heart failure.  Bibasilar airspace disease likely reflects atelectasis.  Infection is not excluded.  Patient was given IV Rocephin and azithromycin for the concern of pneumonia.  She was started on nitro drip as well.  Nephrology consulted for further evaluation.  Her COVID-19 came back positive.  Review of Systems: As per HPI otherwise negative.    Past Medical History:  Diagnosis Date   Anemia    Arthritis    "knees"   CAD (coronary artery disease)    Nonobstructive on CT 2019   ESRD (end stage renal disease) ([place])    Dialysis M/W/F   Headache(784.0)    High cholesterol    Hypertension    Nerve pain    "they say I have L5 nerve damage; my lumbar"   Pericardial effusion    Pneumonia    x 2    PVD (peripheral vascular disease) (HCC)    Right leg stent in [place].  (No records)   Restless legs    Sleep apnea    does not use Cpap   Type II diabetes mellitus ([place])     Past Surgical History:  Procedure Laterality Date   AV FISTULA PLACEMENT Left    CESAREAN SECTION  1996; 1997   x 2   COLONOSCOPY     EYE SURGERY Bilateral    cataract surgery   INSERTION OF DIALYSIS CATHETER Right 09/26/2018   Procedure: INSERTION OF DIALYSIS CATHETER Right Internal Jugular.;  Surgeon: Elam Dutch, MD;  Location: [facility];  Service: Vascular;  Laterality: Right;   LIGATION OF ARTERIOVENOUS  FISTULA Left 09/26/2018   Procedure:  LIGATION OF ARTERIOVENOUS  FISTULA LEFT ARM;  Surgeon: Elam Dutch, MD;  Location: [facility];  Service: Vascular;  Laterality: Left;   PERICARDIAL WINDOW  02/2004   for pericardial effusion   TUBAL LIGATION  05/1995     reports that she has been smoking cigarettes. She has a 10.00 pack-year smoking history. She has never used smokeless tobacco. She reports that she does not drink  alcohol or use drugs.  No Known Allergies  Family History  Problem Relation Age of Onset   Cancer Brother    Heart disease Father        Died age 32   Diabetes Father    Hyperlipidemia Father    Hypertension Father    Stroke Father    Diabetes Mother    Hypertension Mother    Stroke Mother    Dementia Mother    Diabetes Sister    Diabetes Brother    Hypertension Sister    Hypertension Brother     Prior to Admission medications   Medication Sig Start Date End Date Taking? Authorizing Provider  atorvastatin (LIPITOR) 80 MG tablet Take 1 tablet (80 mg total) by mouth daily. Take 1/2 tab daily for one week, then increase to the full tab. Patient taking differently: Take 80 mg by mouth daily at 6 PM.  06/01/17  Yes Tereasa Coop, PA-C  AURYXIA 1 GM 210 MG(Fe) tablet Take 630 mg by mouth 3 (three) times daily with meals.  05/29/18  Yes [provider]  b complex-vitamin c-folic acid (NEPHRO-VITE) 0.8 MG TABS tablet Take 1 tablet by mouth Every Tuesday,Thursday,and Saturday with dialysis.  07/17/18  Yes [provider]  benzonatate (TESSALON) 200 MG capsule Take 1 capsule (200 mg total) by mouth 3 (three) times daily as needed for cough. 09/27/18  Yes Sheikh, Omair Latif, DO  diphenoxylate-atropine (LOMOTIL) 2.5-0.025 MG tablet Take 1 tablet by mouth 4 (four) times daily as needed for diarrhea or loose stools. 09/06/17  Yes Kirichenko, Tatyana, PA-C  Insulin Lispro Prot & Lispro (HUMALOG MIX 75/25 KWIKPEN) (75-25) 100 UNIT/ML Kwikpen Inject 25-30 Units into the skin 2 (two) times daily with a meal.    Yes [provider]  metoprolol succinate (TOPROL-XL) 25 MG 24 hr tablet Take 1 tablet (25 mg total) by mouth daily. Patient taking differently: Take 100 mg by mouth daily.  06/01/17  Yes Tereasa Coop, PA-C  oxyCODONE (ROXICODONE) 15 MG immediate release tablet Take 1 tablet (15 mg total) by mouth daily as needed for pain. 09/20/18  Yes Mariel Aloe,  MD  pantoprazole (PROTONIX) 40 MG tablet Take 1 tablet (40 mg total) by mouth daily. 10/14/18 11/13/18 Yes Arrien, Jimmy Picket, MD  pramipexole (MIRAPEX) 0.5 MG tablet Take 0.5 mg by mouth daily.  05/18/17  Yes [provider]  vitamin C (VITAMIN C) 500 MG tablet Take 1 tablet (500 mg total) by mouth daily. Patient taking differently: Take 500 mg by mouth at bedtime.  09/21/18  Yes Mariel Aloe, MD  zinc sulfate 220 (50 Zn) MG capsule Take 1 capsule (220 mg total) by mouth daily. 09/21/18  Yes Mariel Aloe, MD  hydrOXYzine (ATARAX/VISTARIL) 25 MG tablet Take 25 mg by mouth daily.  05/12/17   [provider]    Physical Exam: Vitals:   10/23/18 1255 10/23/18 1302 10/23/18 1305 10/23/18 1315  BP: (!) 205/155 (!) 157/99 (!) 162/88 (!) 151/86  Pulse:  86    Resp: 19 (!) 25 (!) 24  19  Temp:      TempSrc:      SpO2:  99%    Weight:      Height:        Constitutional: NAD, calm, comfortable Vitals:   10/23/18 1255 10/23/18 1302 10/23/18 1305 10/23/18 1315  BP: (!) 205/155 (!) 157/99 (!) 162/88 (!) 151/86  Pulse:  86    Resp: 19 (!) 25 (!) 24 19  Temp:      TempSrc:      SpO2:  99%    Weight:      Height:       Constitutional: Patient alert and oriented, on 2 L of oxygen via nasal cannula.  In mild respiratory distress.  Has very poor hygiene. Eyes: PERRL, lids and conjunctivae normal ENMT: Mucous membranes are moist. Posterior pharynx clear of any exudate or lesions.Normal dentition.  Neck: normal, supple, no masses, no thyromegaly Respiratory: clear to auscultation bilaterally, no wheezing, no crackles. Normal respiratory effort. No accessory muscle use.  Cardiovascular: Regular rate and rhythm, no murmurs / rubs / gallops. No extremity edema. 2+ pedal pulses. No carotid bruits.  Abdomen: no tenderness, no masses palpated. No hepatosplenomegaly. Bowel sounds positive.  Musculoskeletal: no clubbing / cyanosis.  Left thumb: Dressing was applied.  Ulcer noted  between thumb and index finger.  No signs of drainage or bleeding. Skin: Onychomycosis noted in both feet.  Very poor hygiene.  Has well-healed scar noted on right anterior aspect of leg.  Dialysis catheter noted on right upper chest. Neurologic: CN 2-12 grossly intact. Sensation intact, DTR normal. Strength 5/5 in all 4.  Psychiatric: Normal judgment and insight. Alert and oriented x 3. Normal mood.    Labs on Admission: I have personally reviewed following labs and imaging studies  CBC: Recent Labs  Lab 10/23/18 0950 10/23/18 1043  WBC 8.4  --   NEUTROABS 6.4  --   HGB 8.4* 8.8*   9.2*  HCT 26.6* 26.0*   27.0*  MCV 100.8*  --   PLT 193  --    Basic Metabolic Panel: Recent Labs  Lab 10/23/18 0950 10/23/18 1043 10/23/18 1150  NA 137 136   137  --   K 4.4 4.3   4.3  --   CL 95* 96*  --   CO2 27  --   --   GLUCOSE 378* 370*  --   BUN 36* 35*  --   CREATININE 6.50* 6.30*  --   CALCIUM 9.2  --   --   MG  --   --  2.1  PHOS  --   --  3.8   GFR: Estimated Creatinine Clearance: 13.2 mL/min (A) (by C-G formula based on SCr of 6.3 mg/dL (H)). Liver Function Tests: No results for input(s): AST, ALT, ALKPHOS, BILITOT, PROT, ALBUMIN in the last 168 hours. No results for input(s): LIPASE, AMYLASE in the last 168 hours. No results for input(s): AMMONIA in the last 168 hours. Coagulation Profile: No results for input(s): INR, PROTIME in the last 168 hours. Cardiac Enzymes: No results for input(s): CKTOTAL, CKMB, CKMBINDEX, TROPONINI in the last 168 hours. BNP (last 3 results) No results for input(s): PROBNP in the last 8760 hours. HbA1C: No results for input(s): HGBA1C in the last 72 hours. CBG: Recent Labs  Lab 10/23/18 1358  GLUCAP 293*   Lipid Profile: No results for input(s): CHOL, HDL, LDLCALC, TRIG, CHOLHDL, LDLDIRECT in the last 72 hours. Thyroid Function Tests: Recent Labs    10/23/18  1253  TSH 2.957   Anemia Panel: No results for input(s): VITAMINB12,  FOLATE, FERRITIN, TIBC, IRON, RETICCTPCT in the last 72 hours. Urine analysis:    Component Value Date/Time   COLORURINE YELLOW 07/22/2011 1526   APPEARANCEUR CLOUDY (A) 07/22/2011 1526   LABSPEC 1.022 07/22/2011 1526   PHURINE 5.5 07/22/2011 1526   GLUCOSEU 100 (A) 07/22/2011 1526   HGBUR MODERATE (A) 07/22/2011 1526   BILIRUBINUR NEGATIVE 07/22/2011 1526   BILIRUBINUR NEG 07/19/2011 1202   KETONESUR NEGATIVE 07/22/2011 1526   PROTEINUR >300 (A) 07/22/2011 1526   UROBILINOGEN 0.2 07/22/2011 1526   NITRITE NEGATIVE 07/22/2011 1526   LEUKOCYTESUR TRACE (A) 07/22/2011 1526    Radiological Exams on Admission: Ct Head Wo Contrast  Result Date: 10/23/2018 CLINICAL DATA:  Altered level of consciousness. Severe shortness of breath beginning at 3 a.m. today. Positive for COVID-19 EXAM: CT HEAD WITHOUT CONTRAST TECHNIQUE: Contiguous axial images were obtained from the base of the skull through the vertex without intravenous contrast. COMPARISON:  CT head without contrast 09/18/2018 FINDINGS: Brain: Mild generalized atrophy and white matter disease is stable. No acute infarct, hemorrhage, or mass lesion is present. The ventricles are of normal size. No significant extraaxial fluid collection is present. The brainstem and cerebellum are within normal limits. Vascular: Atherosclerotic changes are noted within the cavernous internal carotid arteries bilaterally. There is no hyperdense vessel. Skull: Calvarium is intact. No focal lytic or blastic lesions are present. Sinuses/Orbits: The paranasal sinuses and mastoid air cells are clear. Bilateral lens replacements are noted. Globes and orbits are otherwise unremarkable. IMPRESSION: 1. Stable appearance of mild generalized atrophy and white matter disease. This likely reflects the sequela of chronic microvascular ischemia. 2. No acute intracranial abnormality. 3. Atherosclerosis. Electronically Signed   By: San Morelle M.D.   On: 10/23/2018 14:41    Dg Chest Port 1 View  Result Date: 10/23/2018 CLINICAL DATA:  Shortness of breath. EXAM: PORTABLE CHEST 1 VIEW COMPARISON:  ONE-VIEW CHEST X-RAY 10/11/2018 FINDINGS: The heart is enlarged. A right IJ dialysis catheter is stable. Interstitial edema and bilateral effusions have increased. Bibasilar airspace disease has progressed, left greater than right. Lung volumes remain low. IMPRESSION: 1. Cardiomegaly with increasing interstitial edema and bilateral pleural effusions consistent with progressive congestive heart failure. 2. Bibasilar airspace disease likely reflects atelectasis. Infection is not excluded. 3. Dialysis catheter is stable. Electronically Signed   By: San Morelle M.D.   On: 10/23/2018 10:20    EKG: Sinus rhythm, right bundle branch block, arterial premature complexes noted.  No acute ST-T wave changes noted.  Assessment/Plan Principal Problem:   Acute respiratory distress Active Problems:   Hypertension   Type II diabetes mellitus with complication, uncontrolled (HCC)   Hyperlipidemia   ESRD on dialysis ([employer])   Anemia   ESRD (end stage renal disease) ([place])   COVID-19 virus detected   Obesity   Coronary artery disease   Tobacco abuse   Volume overload   Acute hypoxic respiratory distress: -Likely secondary to fluid overload-could be secondary to CHF versus end-stage renal disease versus flash pulmonary edema secondary to uncontrolled hypertension -Reviewed chest x-ray which shows cardiomegaly with increasing interstitial edema and bilateral pleural effusion consistent with progressive congestive heart failure.  Bibasilar airspace disease likely reflects atelectasis.  Infection is not excluded. -Patient placed on 2 L of oxygen via nasal cannula. -Started on IV nitro in ED -Troponin is elevated at 50.  Will trend x2.  EKG: No acute changes-elevated troponin could be secondary to  demand ischemia secondary to worsening kidney function.  Patient denies chest  pain. -proBNP elevated in 2159.  Will check TSH and transthoracic echo. -Strict INO's and daily weight.  Monitor electrolytes. -Nephrology consulted by EDP-appreciate help-recommended urgent dialysis due to worsening shortness of breath and uncontrolled blood pressure. -Considering acute onset of shortness of breath along with hypoxia, COVID-19 positive status, morbid obesity, hypertension, hyperlipidemia and diabetes mellitus, elevated d-dimer-we will get CT angiogram of chest to rule out pulmonary embolism and start patient on heparin drip.  Consider stopping heparin drip once CT angiogram comes back negative.  COVID-19 infection: -Reviewed chest x-ray.  On contact isolation. -Received IV Rocephin and azithromycin in ED for the concern of pneumonia. -We will start her on dexamethasone 6 mg p.o. daily for 10 days. -On continuous pulse ox.  Currently on 2 L of oxygen via nasal cannula-try to wean off from oxygen.  End-stage renal disease on dialysis: -Patient reports being compliant with her dialysis appointments. -On Tuesdays, Thursdays and Saturdays. -Consulted nephrology-appreciate help  Hypertension: Uncontrolled -Blood pressure is elevated upon arrival. -Started on IV nitro drip.  Continue home dose of metoprolol.  Labetalol 10 mg as needed for blood pressure more than 160/100. -Getting urgent dialysis today. -We will continue her home medication and monitor her blood pressure very closely  Diabetes mellitus: Uncontrolled.  A1c 8.7% -She is on 75/25, 30 units twice daily at home-started on Levemir 12 units twice daily and NovoLog 3 units 3 times daily with meals -Monitor blood sugar closely.  Anemia of chronic disease: Likely secondary to underlying chronic kidney disease. -No signs of bleeding.  Monitor H&H.   -ESA with dialysis.  Morbid obesity: -Counseled regarding diet modification, exercise and weight loss.  Tobacco abuse: Smokes half pack of cigarettes per day -Counseled  about cessation.  Gangrene of left thumb: -Follows vascular surgery outpatient. -Patient is afebrile, no leukocytosis.  DVT prophylaxis: On heparin drip Code Status: Full code Family Communication: None present at bedside.  Plan of care discussed with patient in length and he verbalized understanding and agreed with it. Disposition Plan: TBD Consults called: Nephrology Dr. Carolin Sicks  admission status: Inpatient   Mckinley Jewel MD [facility] Pager [phone]  If 7PM-7AM, please contact night-coverage [email or web address] Password [facility]  10/23/2018, 3:05 PM

## 2018-10-23 NOTE — Progress Notes (Signed)
ANTICOAGULATION CONSULT NOTE - Initial Consult  Pharmacy Consult for Heparin Indication: rule out pulmonary embolus  No Known Allergies  Patient Measurements: Height: 5\' 8"  (172.7 cm) Weight: 240 lb 4.8 oz (109 kg) IBW/kg (Calculated) : 63.9 Heparin Dosing Weight: 88.6 kg  Vital Signs: Temp: 98.3 F (36.8 C) (09/21 1614) Temp Source: Oral (09/21 1614) BP: 173/98 (09/21 1614) Pulse Rate: 90 (09/21 1614)  Labs: Recent Labs    10/23/18 0950 10/23/18 1043 10/23/18 1150 10/23/18 1545  HGB 8.4* 8.8*  9.2*  --   --   HCT 26.6* 26.0*  27.0*  --   --   PLT 193  --   --   --   CREATININE 6.50* 6.30*  --   --   TROPONINIHS 50*  --  49* 48*    Estimated Creatinine Clearance: 13.2 mL/min (A) (by C-G formula based on SCr of 6.3 mg/dL (H)).   Medical History: Past Medical History:  Diagnosis Date  . Anemia   . Arthritis    "knees"  . CAD (coronary artery disease)    Nonobstructive on CT 2019  . ESRD (end stage renal disease) ([place])    Dialysis M/W/F  . Headache(784.0)   . High cholesterol   . Hypertension   . 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 Pericardial effusion   . Pneumonia    x 2   . PVD (peripheral vascular disease) ([place])    Right leg stent in [place].  (No records)  . Restless legs   . Sleep apnea    does not use Cpap  . Type II diabetes mellitus (HCC)     Medications:  Scheduled:  . atorvastatin  80 mg Oral Daily  . [START ON 10/24/2018] calcitRIOL  1.5 mcg Oral Q T,Th,Sa-HD  . [START ON 10/24/2018] Chlorhexidine Gluconate Cloth  6 each Topical Q0600  . [START ON 10/24/2018] darbepoetin (ARANESP) injection - DIALYSIS  200 mcg Intravenous Q Tue-HD  . dexamethasone  6 mg Oral Daily  . feeding supplement (PRO-STAT SUGAR FREE 64)  30 mL Oral BID  . ferric citrate  630 mg Oral TID WC  . insulin aspart  3 Units Subcutaneous TID WC  . insulin detemir  12 Units Subcutaneous BID  . metoprolol succinate  25 mg Oral Daily  .  multivitamin  1 tablet Oral QHS  . pantoprazole  40 mg Oral Daily   Infusions:  . nitroGLYCERIN 10 mcg/min (10/23/18 1346)    Assessment: 53 yo F presented to ED with sudden onset SOB.  CXR + pulm edema and infiltrates.  Concern for volume overload vs PE.  CTA chest has been ordered.  Pharmacy has been asked to start heparin pending results.  Of note, pt has had multiple recent hospitalizations with known COVID + status.    Pt also has ESRD TTS.  Last HD 9/19.    Goal of Therapy:  Heparin level 0.3-0.7 units/ml Monitor platelets by anticoagulation protocol: Yes   Plan:  Heparin 3500 units IV bolus x 1 Heparin infusion at 1200 untis/hr. Heparin level in 8 hours. Heparin level and CBC daily while on heparin. D/C heparin if CT neg for PE per Dr. Doristine Bosworth.  [employer], Pharm.D., BCPS Clinical Pharmacist  **Pharmacist phone directory can now be found on [email or web address] ([email or web address]).  Listed under [place].  10/23/2018 5:26 PM

## 2018-10-23 NOTE — Progress Notes (Signed)
Dear Doctor: This patient has been identified as a candidate for CVC for the following reason (s): small, branching veins. If you agree, please write an order for the indicated device. For any questions contact the Vascular Access Team at [phone] if no answer, please leave a message.  Thank you for supporting the early vascular access assessment program.

## 2018-10-23 NOTE — Consult Note (Signed)
[facility] Renal Consultation Note    Indication for Consultation:  Management of ESRD/hemodialysis; anemia, hypertension/volume and secondary hyperparathyroidism   HPI: [name] is a 53 y.o. female with ESRD on HD, DM Type II, HTN, CAD. Hx COVID-19 + status. Initially tested positive on 09/14/18 and subsequently hospitalized with PNA. Most recent [facility] admission 9/7-9/12/20 with acute hypoxic respiratory failure d/t volume overload. No signs of recurrent active COVID-19 pneumonia during that admission.  Now admitted with SOB. COVID-19 testing remains positive today. Afebrile. O2 sats 96% on 2L [place]. Labs  K+ 4.3, Glucose 370, Troponin 49. WBC 8.4, Hgb 8.8. CXR shows worsening CHF and bibasilar airspace disease L>R.  Blood cultures collected. Empiric antibiotics started.    Has been dialyzing at [place] on COVID isolation shift TTS. She has been compliant with dialysis. Last dialysis was Saturday. Completed a full treatment and left 0.5kg over her dry weight.    Past Medical History:  Diagnosis Date  . Anemia   . Arthritis    "knees"  . CAD (coronary artery disease)    Nonobstructive on CT 2019  . ESRD (end stage renal disease) ([place])    Dialysis M/W/F  . Headache(784.0)   . High cholesterol   . Hypertension   . 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 Pericardial effusion   . Pneumonia    x 2   . PVD (peripheral vascular disease) ([place])    Right leg stent in [place].  (No records)  . Restless legs   . Sleep apnea    does not use Cpap  . Type II diabetes mellitus ([place])    Past Surgical History:  Procedure Laterality Date  . AV FISTULA PLACEMENT Left   . [place]; 1997   x 2  . COLONOSCOPY    . EYE SURGERY Bilateral    cataract surgery  . INSERTION OF DIALYSIS CATHETER Right 09/26/2018   Procedure: INSERTION OF DIALYSIS CATHETER Right Internal Jugular.;  Surgeon: Elam Dutch, MD;  Location: [place];   Service: Vascular;  Laterality: Right;  . LIGATION OF ARTERIOVENOUS  FISTULA Left 09/26/2018   Procedure: LIGATION OF ARTERIOVENOUS  FISTULA LEFT ARM;  Surgeon: Elam Dutch, MD;  Location: [place];  Service: Vascular;  Laterality: Left;  . PERICARDIAL WINDOW  02/2004   for pericardial effusion  . TUBAL LIGATION  05/1995   Family History  Problem Relation Age of Onset  . Cancer Brother   . Heart disease Father        Died age 84  . Diabetes Father   . Hyperlipidemia Father   . Hypertension Father   . Stroke Father   . Diabetes Mother   . Hypertension Mother   . Stroke Mother   . Dementia Mother   . Diabetes Sister   . Diabetes Brother   . Hypertension Sister   . Hypertension Brother    Social History:  reports that she has been smoking cigarettes. She has a 10.00 pack-year smoking history. She has never used smokeless tobacco. She reports that she does not drink alcohol or use drugs. No Known Allergies Prior to Admission medications   Medication Sig Start Date End Date Taking? Authorizing Provider  atorvastatin (LIPITOR) 80 MG tablet Take 1 tablet (80 mg total) by mouth daily. Take 1/2 tab daily for one week, then increase to the full tab. Patient taking differently: Take 80 mg by mouth daily at  6 PM.  06/01/17  Yes Tereasa Coop, PA-C  AURYXIA 1 GM 210 MG(Fe) tablet Take 630 mg by mouth 3 (three) times daily with meals.  05/29/18  Yes [provider]  b complex-vitamin c-folic acid (NEPHRO-VITE) 0.8 MG TABS tablet Take 1 tablet by mouth Every Tuesday,Thursday,and Saturday with dialysis.  07/17/18  Yes [provider]  benzonatate (TESSALON) 200 MG capsule Take 1 capsule (200 mg total) by mouth 3 (three) times daily as needed for cough. 09/27/18  Yes Sheikh, Omair Latif, DO  diphenoxylate-atropine (LOMOTIL) 2.5-0.025 MG tablet Take 1 tablet by mouth 4 (four) times daily as needed for diarrhea or loose stools. 09/06/17  Yes Kirichenko, Tatyana, PA-C  hydrOXYzine  (ATARAX/VISTARIL) 25 MG tablet Take 25 mg by mouth daily.  05/12/17  Yes [provider]  Insulin Lispro Prot & Lispro (HUMALOG MIX 75/25 KWIKPEN) (75-25) 100 UNIT/ML Kwikpen Inject 25-30 Units into the skin 2 (two) times daily with a meal.    Yes [provider]  metoprolol succinate (TOPROL-XL) 25 MG 24 hr tablet Take 1 tablet (25 mg total) by mouth daily. Patient taking differently: Take 100 mg by mouth daily.  06/01/17  Yes Tereasa Coop, PA-C  oxyCODONE (ROXICODONE) 15 MG immediate release tablet Take 1 tablet (15 mg total) by mouth daily as needed for pain. 09/20/18  Yes Mariel Aloe, MD  pantoprazole (PROTONIX) 40 MG tablet Take 1 tablet (40 mg total) by mouth daily. 10/14/18 11/13/18 Yes Arrien, Jimmy Picket, MD  pramipexole (MIRAPEX) 0.5 MG tablet Take 0.5 mg by mouth daily.  05/18/17  Yes [provider]  vitamin C (VITAMIN C) 500 MG tablet Take 1 tablet (500 mg total) by mouth daily. Patient taking differently: Take 500 mg by mouth at bedtime.  09/21/18  Yes Mariel Aloe, MD  zinc sulfate 220 (50 Zn) MG capsule Take 1 capsule (220 mg total) by mouth daily. 09/21/18  Yes Mariel Aloe, MD   Current Facility-Administered Medications  Medication Dose Route Frequency Provider Last Rate Last Dose  . acetaminophen (TYLENOL) tablet 650 mg  650 mg Oral Q6H PRN Pahwani, Rinka R, MD       Or  . acetaminophen (TYLENOL) suppository 650 mg  650 mg Rectal Q6H PRN Pahwani, Rinka R, MD      . atorvastatin (LIPITOR) tablet 80 mg  80 mg Oral Daily Pahwani, Rinka R, MD      . Derrill Memo ON 10/24/2018] Chlorhexidine Gluconate Cloth 2 % PADS 6 each  6 each Topical Q0600 Dilcia Rybarczyk, Thomos Lemons, PA-C      . enoxaparin (LOVENOX) injection 30 mg  30 mg Subcutaneous Q24H Pahwani, Rinka R, MD      . ferric citrate (AURYXIA) tablet 630 mg  630 mg Oral TID WC Pahwani, Rinka R, MD      . insulin aspart (novoLOG) injection 3 Units  3 Units Subcutaneous TID WC Pahwani, Rinka R, MD      .  insulin detemir (LEVEMIR) injection 12 Units  12 Units Subcutaneous BID Pahwani, Rinka R, MD      . metoprolol succinate (TOPROL-XL) 24 hr tablet 25 mg  25 mg Oral Daily Pahwani, Rinka R, MD      . nitroGLYCERIN 50 mg in dextrose 5 % 250 mL (0.2 mg/mL) infusion  0-200 mcg/min Intravenous Continuous Maudie Flakes, MD 3 mL/hr at 10/23/18 1346 10 mcg/min at 10/23/18 1346  . ondansetron (ZOFRAN) tablet 4 mg  4 mg Oral Q6H PRN Pahwani, Rinka R,  MD       Or  . ondansetron (ZOFRAN) injection 4 mg  4 mg Intravenous Q6H PRN Pahwani, Rinka R, MD      . pantoprazole (PROTONIX) EC tablet 40 mg  40 mg Oral Daily Pahwani, Rinka R, MD       Current Outpatient Medications  Medication Sig Dispense Refill  . atorvastatin (LIPITOR) 80 MG tablet Take 1 tablet (80 mg total) by mouth daily. Take 1/2 tab daily for one week, then increase to the full tab. (Patient taking differently: Take 80 mg by mouth daily at 6 PM. ) 90 tablet 3  . AURYXIA 1 GM 210 MG(Fe) tablet Take 630 mg by mouth 3 (three) times daily with meals.     Marland Kitchen b complex-vitamin c-folic acid (NEPHRO-VITE) 0.8 MG TABS tablet Take 1 tablet by mouth Every Tuesday,Thursday,and Saturday with dialysis.     Marland Kitchen benzonatate (TESSALON) 200 MG capsule Take 1 capsule (200 mg total) by mouth 3 (three) times daily as needed for cough. 20 capsule 0  . diphenoxylate-atropine (LOMOTIL) 2.5-0.025 MG tablet Take 1 tablet by mouth 4 (four) times daily as needed for diarrhea or loose stools. 30 tablet 0  . hydrOXYzine (ATARAX/VISTARIL) 25 MG tablet Take 25 mg by mouth daily.   1  . Insulin Lispro Prot & Lispro (HUMALOG MIX 75/25 KWIKPEN) (75-25) 100 UNIT/ML Kwikpen Inject 25-30 Units into the skin 2 (two) times daily with a meal.     . metoprolol succinate (TOPROL-XL) 25 MG 24 hr tablet Take 1 tablet (25 mg total) by mouth daily. (Patient taking differently: Take 100 mg by mouth daily. ) 90 tablet 3  . oxyCODONE (ROXICODONE) 15 MG immediate release tablet Take 1 tablet (15 mg  total) by mouth daily as needed for pain.  0  . pantoprazole (PROTONIX) 40 MG tablet Take 1 tablet (40 mg total) by mouth daily. 30 tablet 0  . pramipexole (MIRAPEX) 0.5 MG tablet Take 0.5 mg by mouth daily.   1  . vitamin C (VITAMIN C) 500 MG tablet Take 1 tablet (500 mg total) by mouth daily. (Patient taking differently: Take 500 mg by mouth at bedtime. ) 14 tablet 0  . zinc sulfate 220 (50 Zn) MG capsule Take 1 capsule (220 mg total) by mouth daily. 14 capsule 0     ROS: As per HPI otherwise negative.  Physical Exam: Vitals:   10/23/18 1255 10/23/18 1302 10/23/18 1305 10/23/18 1315  BP: (!) 205/155 (!) 157/99 (!) 162/88 (!) 151/86  Pulse:  86    Resp: 19 (!) 25 (!) 24 19  Temp:      TempSrc:      SpO2:  99%    Weight:      Height:         Due to the nature of this patient's COVID-19 with isolation and in keeping with efforts to prevent the spread of infection and to conserve personal protective equipment, a physical exam was not performed.  A chart review of other providers notes and the patient's lab work as well as review of other pertinent studies was performed.    Dialysis Access: [facility] placed 8/25   Labs: Basic Metabolic Panel: Recent Labs  Lab 10/23/18 0950 10/23/18 1043 10/23/18 1150  NA 137 136  137  --   K 4.4 4.3  4.3  --   CL 95* 96*  --   CO2 27  --   --   GLUCOSE 378* 370*  --   BUN  36* 35*  --   CREATININE 6.50* 6.30*  --   CALCIUM 9.2  --   --   PHOS  --   --  3.8   Liver Function Tests: No results for input(s): AST, ALT, ALKPHOS, BILITOT, PROT, ALBUMIN in the last 168 hours. No results for input(s): LIPASE, AMYLASE in the last 168 hours. No results for input(s): AMMONIA in the last 168 hours. CBC: Recent Labs  Lab 10/23/18 0950 10/23/18 1043  WBC 8.4  --   NEUTROABS 6.4  --   HGB 8.4* 8.8*  9.2*  HCT 26.6* 26.0*  27.0*  MCV 100.8*  --   PLT 193  --    Cardiac Enzymes: No results for input(s): CKTOTAL, CKMB, CKMBINDEX, TROPONINI in the  last 168 hours. CBG: No results for input(s): GLUCAP in the last 168 hours. Iron Studies: No results for input(s): IRON, TIBC, TRANSFERRIN, FERRITIN in the last 72 hours. Studies/Results: Dg Chest Port 1 View  Result Date: 10/23/2018 CLINICAL DATA:  Shortness of breath. EXAM: PORTABLE CHEST 1 VIEW COMPARISON:  ONE-VIEW CHEST X-RAY 10/11/2018 FINDINGS: The heart is enlarged. A right IJ dialysis catheter is stable. Interstitial edema and bilateral effusions have increased. Bibasilar airspace disease has progressed, left greater than right. Lung volumes remain low. IMPRESSION: 1. Cardiomegaly with increasing interstitial edema and bilateral pleural effusions consistent with progressive congestive heart failure. 2. Bibasilar airspace disease likely reflects atelectasis. Infection is not excluded. 3. Dialysis catheter is stable. Electronically Signed   By: San Morelle M.D.   On: 10/23/2018 10:20    Dialysis Orders:  [facility] TTS 4h 450/800 EDW 108kg 2K/2Ca  Heparin 10000 U Venofer 100mg  IV x 5 (To start 9/22) Mircera 225 mcg IV q 2 weeks (has not received d/t hospital admissions)  Calcitriol 1.5 mcg PO TIW   Assessment/Plan: 1. Dyspnea/Hx COVID-19 PNA. --Likely combination of volume excess + infection. Plan for urgent HD today for volume removal and follow.    2. ESRD -  Usually TTS HD. HD today off schedule. Resume regular schedule tomorrow.  3. Hypertension/volume  - Hypertensive on admission. On metoprolol 25 at home. Max UF today with HD.  4. Anemia  - Hgb 8.8 Resume ESA with dialysis 2/75.  5. Metabolic bone disease -  Continue Calcitriol/Auyrixa binder  6. DM Type II - Hyperglycemic on admission. Insulin per primary  7. Nutrition. Renal diet. Prot supp for low albumin.  8. Indeterminate right renal mass - Found on imaging during prior admission. To f/u with urology as outpatient.   Lynnda Child PA-C [facility] Pager [phone] 10/23/2018, 1:56 PM

## 2018-10-23 NOTE — ED Notes (Signed)
Attempted report x1. 

## 2018-10-23 NOTE — ED Notes (Signed)
ED TO INPATIENT HANDOFF REPORT  ED Nurse Name and Phone #:  Percell Locus, RN  S Name/Age/Gender [name] 53 y.o. female Room/Bed: 042C/042C  Code Status   Code Status: Full Code  Home/SNF/Other Home Patient oriented to: self, place, time and situation Is this baseline? Yes   Triage Complete: Triage complete  Chief Complaint sob  Triage Note Sob starting last night- dialysis pt who went on Saturday. Diabetic. Has not taken meds. SUgar was 555. Sats normal and lungs clear.    Allergies No Known Allergies  Level of Care/Admitting Diagnosis ED Disposition    ED Disposition Condition [facility]: [place] [[id number]]  Level of Care: Progressive [102]  I expect the patient will be discharged within 24 hours: No (not a candidate for 5C-Observation unit)  Covid Evaluation: Confirmed COVID Positive  Diagnosis: Shortness of breath [786.05.ICD-9-CM]  Admitting Physician: Mckinley Jewel [[id number]]  Attending Physician: Mckinley Jewel [phone]  PT Class (Do Not Modify): Inpatient [101]       B Medical/Surgery History Past Medical History:  Diagnosis Date  . Anemia   . Arthritis    "knees"  . CAD (coronary artery disease)    Nonobstructive on CT 2019  . ESRD (end stage renal disease) ([place])    Dialysis M/W/F  . Headache(784.0)   . High cholesterol   . Hypertension   . 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 Pericardial effusion   . Pneumonia    x 2   . PVD (peripheral vascular disease) ([place])    Right leg stent in [place].  (No records)  . Restless legs   . Sleep apnea    does not use Cpap  . Type II diabetes mellitus ([place])    Past Surgical History:  Procedure Laterality Date  . AV FISTULA PLACEMENT Left   . [place]; 1997   x 2  . COLONOSCOPY    . EYE SURGERY Bilateral    cataract surgery  . INSERTION OF DIALYSIS CATHETER Right 09/26/2018   Procedure: INSERTION OF DIALYSIS  CATHETER Right Internal Jugular.;  Surgeon: Elam Dutch, MD;  Location: [facility];  Service: Vascular;  Laterality: Right;  . LIGATION OF ARTERIOVENOUS  FISTULA Left 09/26/2018   Procedure: LIGATION OF ARTERIOVENOUS  FISTULA LEFT ARM;  Surgeon: Elam Dutch, MD;  Location: [facility];  Service: Vascular;  Laterality: Left;  . PERICARDIAL WINDOW  02/2004   for pericardial effusion  . TUBAL LIGATION  05/1995     A IV Location/Drains/Wounds Patient Lines/Drains/Airways Status   Active Line/Drains/Airways    Name:   Placement date:   Placement time:   Site:   Days:   Peripheral IV 10/23/18 Right Hand   10/23/18    0946    Hand   less than 1   Fistula / Graft Left Upper arm Arteriovenous vein graft   -    -    Upper arm      Hemodialysis Catheter Right Internal jugular Double lumen Permanent (Tunneled)   09/26/18    1123    Internal jugular   27   Incision (Closed) 09/26/18 Chest Right   09/26/18    1243     27   Incision (Closed) 09/26/18 Arm Left   09/26/18    1243     27   Incision (Closed) 09/26/18 Hand Left   09/26/18    1623  27          Intake/Output Last 24 hours No intake or output data in the 24 hours ending 10/23/18 1303  Labs/Imaging Results for orders placed or performed during the hospital encounter of 10/23/18 (from the past 48 hour(s))  Basic metabolic panel     Status: Abnormal   Collection Time: 10/23/18  9:50 AM  Result Value Ref Range   Sodium 137 135 - 145 mmol/L   Potassium 4.4 3.5 - 5.1 mmol/L   Chloride 95 (L) 98 - 111 mmol/L   CO2 27 22 - 32 mmol/L   Glucose, Bld 378 (H) 70 - 99 mg/dL   BUN 36 (H) 6 - 20 mg/dL   Creatinine, Ser 6.50 (H) 0.44 - 1.00 mg/dL   Calcium 9.2 8.9 - 10.3 mg/dL   GFR calc non Af Amer 7 (L) >60 mL/min   GFR calc Af Amer 8 (L) >60 mL/min   Anion gap 15 5 - 15    Comment: Performed at [facility], [address], [place], [place] [address]  Troponin I (High Sensitivity)     Status: Abnormal   Collection Time: 10/23/18   9:50 AM  Result Value Ref Range   Troponin I (High Sensitivity) 50 (H) <18 ng/L    Comment: (NOTE) Elevated high sensitivity troponin I (hsTnI) values and significant  changes across serial measurements may suggest ACS but many other  chronic and acute conditions are known to elevate hsTnI results.  Refer to the "Links" section for chest pain algorithms and additional  guidance. Performed at [facility], [place] [address], [place], [place] [address]   CBC with Differential     Status: Abnormal   Collection Time: 10/23/18  9:50 AM  Result Value Ref Range   WBC 8.4 4.0 - 10.5 K/uL   RBC 2.64 (L) 3.87 - 5.11 MIL/uL   Hemoglobin 8.4 (L) 12.0 - 15.0 g/dL   HCT 26.6 (L) 36.0 - 46.0 %   MCV 100.8 (H) 80.0 - 100.0 fL   MCH 31.8 26.0 - 34.0 pg   MCHC 31.6 30.0 - 36.0 g/dL   RDW 16.8 (H) 11.5 - 15.5 %   Platelets 193 150 - 400 K/uL   nRBC 0.0 0.0 - 0.2 %   Neutrophils Relative % 75 %   Neutro Abs 6.4 1.7 - 7.7 K/uL   Lymphocytes Relative 14 %   Lymphs Abs 1.2 0.7 - 4.0 K/uL   Monocytes Relative 5 %   Monocytes Absolute 0.4 0.1 - 1.0 K/uL   Eosinophils Relative 4 %   Eosinophils Absolute 0.3 0.0 - 0.5 K/uL   Basophils Relative 1 %   Basophils Absolute 0.0 0.0 - 0.1 K/uL   Immature Granulocytes 1 %   Abs Immature Granulocytes 0.06 0.00 - 0.07 K/uL    Comment: Performed at [facility] [address] [place], [place] [address]  D-dimer, quantitative     Status: Abnormal   Collection Time: 10/23/18  9:50 AM  Result Value Ref Range   D-Dimer, Quant 2.92 (H) 0.00 - 0.50 ug/mL-FEU    Comment: (NOTE) At the manufacturer cut-off of 0.50 ug/mL FEU, this assay has been documented to exclude PE with a sensitivity and negative predictive value of 97 to 99%.  At this time, this assay has not been approved by the FDA to exclude DVT/VTE. Results should be correlated with clinical presentation. Performed at [facility], [place] [address], [place], [place] [address]  Brain  natriuretic peptide     Status: Abnormal   Collection Time: 10/23/18  9:51 AM  Result Value Ref Range   B Natriuretic Peptide 2,159.1 (H) 0.0 - 100.0 pg/mL    Comment: Performed at [facility] [address], [place], [place] [address]  Lactic acid, plasma     Status: None   Collection Time: 10/23/18 10:11 AM  Result Value Ref Range   Lactic Acid, Venous 1.6 0.5 - 1.9 mmol/L    Comment: Performed at [facility] [address] [place], [place] [address]  SARS Coronavirus 2 [facility] order, Performed in [facility] hospital lab) Nasopharyngeal Nasopharyngeal Swab     Status: Abnormal   Collection Time: 10/23/18 10:35 AM   Specimen: Nasopharyngeal Swab  Result Value Ref Range   SARS Coronavirus 2 POSITIVE (A) NEGATIVE    Comment: RESULT CALLED TO, READ BACK BY AND VERIFIED WITH: Johny Sax RN 12:30 10/23/18 ([email or web address]) (NOTE) If result is NEGATIVE SARS-CoV-2 target nucleic acids are NOT DETECTED. The SARS-CoV-2 RNA is generally detectable in upper and lower  respiratory specimens during the acute phase of infection. The lowest  concentration of SARS-CoV-2 viral copies this assay can detect is 250  copies / mL. A negative result does not preclude SARS-CoV-2 infection  and should not be used as the sole basis for treatment or other  patient management decisions.  A negative result may occur with  improper specimen collection / handling, submission of specimen other  than nasopharyngeal swab, presence of viral mutation(s) within the  areas targeted by this assay, and inadequate number of viral copies  (<250 copies / mL). A negative result must be combined with clinical  observations, patient history, and epidemiological information. If result is POSITIVE SARS-CoV-2 target nucleic acids are DETECTED. T he SARS-CoV-2 RNA is generally detectable in upper and lower  respiratory specimens during the acute phase of infection.  Positive  results are indicative of active infection  with SARS-CoV-2.  Clinical  correlation with patient history and other diagnostic information is  necessary to determine patient infection status.  Positive results do  not rule out bacterial infection or co-infection with other viruses. If result is PRESUMPTIVE POSTIVE SARS-CoV-2 nucleic acids MAY BE PRESENT.   A presumptive positive result was obtained on the submitted specimen  and confirmed on repeat testing.  While 2019 novel coronavirus  (SARS-CoV-2) nucleic acids may be present in the submitted sample  additional confirmatory testing may be necessary for epidemiological  and / or clinical management purposes  to differentiate between  SARS-CoV-2 and other Sarbecovirus currently known to infect humans.  If clinically indicated additional testing with an alternate test  methodology [phone]) is  advised. The SARS-CoV-2 RNA is generally  detectable in upper and lower respiratory specimens during the acute  phase of infection. The expected result is Negative. Fact Sheet for Patients:  StrictlyIdeas.no Fact Sheet for Healthcare Providers: BankingDealers.co.za This test is not yet approved or cleared by the [place] FDA and has been authorized for detection and/or diagnosis of SARS-CoV-2 by FDA under an Emergency Use Authorization (EUA).  This EUA will remain in effect (meaning this test can be used) for the duration of the COVID-19 declaration under Section 564(b)(1) of the Act, 21 U.S.C. section 360bbb-3(b)(1), unless the authorization is terminated or revoked sooner. Performed at [facility], [place] [address], [place], [place] [address]   I-stat chem 8, ED (not at [facility] or [facility])  Status: Abnormal   Collection Time: 10/23/18 10:43 AM  Result Value Ref Range   Sodium 136 135 - 145 mmol/L   Potassium 4.3 3.5 - 5.1 mmol/L   Chloride 96 (L) 98 - 111 mmol/L   BUN 35 (H) 6 - 20 mg/dL   Creatinine, Ser 6.30 (H) 0.44 - 1.00  mg/dL   Glucose, Bld 370 (H) 70 - 99 mg/dL   Calcium, Ion 1.13 (L) 1.15 - 1.40 mmol/L   TCO2 27 22 - 32 mmol/L   Hemoglobin 8.8 (L) 12.0 - 15.0 g/dL   HCT 26.0 (L) 36.0 - 46.0 %  POCT I-Stat EG7     Status: Abnormal   Collection Time: 10/23/18 10:43 AM  Result Value Ref Range   pH, Ven 7.405 7.250 - 7.430   pCO2, Ven 45.7 44.0 - 60.0 mmHg   pO2, Ven 34.0 32.0 - 45.0 mmHg   Bicarbonate 28.6 (H) 20.0 - 28.0 mmol/L   TCO2 30 22 - 32 mmol/L   O2 Saturation 65.0 %   Acid-Base Excess 3.0 (H) 0.0 - 2.0 mmol/L   Sodium 137 135 - 145 mmol/L   Potassium 4.3 3.5 - 5.1 mmol/L   Calcium, Ion 1.14 (L) 1.15 - 1.40 mmol/L   HCT 27.0 (L) 36.0 - 46.0 %   Hemoglobin 9.2 (L) 12.0 - 15.0 g/dL   Patient temperature HIDE    Sample type VENOUS    Comment NOTIFIED PHYSICIAN    Dg Chest Port 1 View  Result Date: 10/23/2018 CLINICAL DATA:  Shortness of breath. EXAM: PORTABLE CHEST 1 VIEW COMPARISON:  ONE-VIEW CHEST X-RAY 10/11/2018 FINDINGS: The heart is enlarged. A right IJ dialysis catheter is stable. Interstitial edema and bilateral effusions have increased. Bibasilar airspace disease has progressed, left greater than right. Lung volumes remain low. IMPRESSION: 1. Cardiomegaly with increasing interstitial edema and bilateral pleural effusions consistent with progressive congestive heart failure. 2. Bibasilar airspace disease likely reflects atelectasis. Infection is not excluded. 3. Dialysis catheter is stable. Electronically Signed   By: San Morelle M.D.   On: 10/23/2018 10:20    Pending Labs Unresulted Labs (From admission, onward)    Start     Ordered   10/30/18 0500  Creatinine, serum  (enoxaparin (LOVENOX)    CrCl < 30 ml/min)  Weekly,   R    Comments: while on enoxaparin therapy.    10/23/18 1239   10/24/18 8850  Basic metabolic panel  Tomorrow morning,   R     10/23/18 1239   10/24/18 0500  CBC  Tomorrow morning,   R     10/23/18 1239   10/23/18 1247  Culture, blood (routine x 2)   BLOOD CULTURE X 2,   R (with STAT occurrences)     10/23/18 1247   10/23/18 1239  TSH  Once,   STAT     10/23/18 1239   10/23/18 1237  CBC  (enoxaparin (LOVENOX)    CrCl < 30 ml/min)  Once,   STAT    Comments: Baseline for enoxaparin therapy IF NOT ALREADY DRAWN.  Notify MD if PLT < 100 K.    10/23/18 1239   10/23/18 1237  Creatinine, serum  (enoxaparin (LOVENOX)    CrCl < 30 ml/min)  Once,   STAT    Comments: Baseline for enoxaparin therapy IF NOT ALREADY DRAWN.    10/23/18 1239   10/23/18 1150  Magnesium  Once,   R     10/23/18 1150   10/23/18 1150  Phosphorus  Once,   R     10/23/18 1150   10/23/18 1011  Culture, blood (Routine X 2) w Reflex to ID Panel  BLOOD CULTURE X 2,   R (with STAT occurrences)     10/23/18 1011          Vitals/Pain Today's Vitals   10/23/18 1231 10/23/18 1245 10/23/18 1250 10/23/18 1255  BP: (!) 126/93 (!) 179/105 (!) 192/92 (!) 205/155  Pulse:      Resp: (!) 25 20 18 19   Temp:      TempSrc:      SpO2:      Weight:      Height:      PainSc:        Isolation Precautions Airborne and Contact precautions  Medications Medications  azithromycin (ZITHROMAX) 500 mg in sodium chloride 0.9 % 250 mL IVPB (500 mg Intravenous New Bag/Given 10/23/18 1212)  nitroGLYCERIN 50 mg in dextrose 5 % 250 mL (0.2 mg/mL) infusion (5 mcg/min Intravenous New Bag/Given 10/23/18 1213)  Chlorhexidine Gluconate Cloth 2 % PADS 6 each (has no administration in time range)  enoxaparin (LOVENOX) injection 30 mg (has no administration in time range)  acetaminophen (TYLENOL) tablet 650 mg (has no administration in time range)    Or  acetaminophen (TYLENOL) suppository 650 mg (has no administration in time range)  ondansetron (ZOFRAN) tablet 4 mg (has no administration in time range)    Or  ondansetron (ZOFRAN) injection 4 mg (has no administration in time range)  insulin aspart (novoLOG) injection 0-15 Units (has no administration in time range)  insulin aspart (novoLOG)  injection 0-5 Units (has no administration in time range)  cefTRIAXone (ROCEPHIN) 1 g in sodium chloride 0.9 % 100 mL IVPB (0 g Intravenous Stopped 10/23/18 1204)    Mobility non-ambulatory Low fall risk   Focused Assessments Renal Assessment Handoff:  Hemodialysis Schedule: Hemodialysis Schedule: Tuesday/Thursday/Saturday Last Hemodialysis date and time: 10/21/18   Restricted appendage:  chest port     R Recommendations: See Admitting Provider Note  Report given to:   Additional Notes:

## 2018-10-23 NOTE — Significant Event (Signed)
Rapid Response Event Note  Overview: Neurologic/Respiratory  Initial Focused Assessment: Called by nurse with concerns of patient decreased LOC and breathing pattern, per nurse, patient arrived from the ED with this presentation. I was with another patient in an emergency and informed the nurse that I did see this patient during her last admission and that she has a history of AMS, waxing mental status presentation, and when I saw her then, the patient was belly breathing but not in distress. I told the nurse to page MD on call and inform the MD and that I would see the patient when I could.  I came to see at 1730. Per nursing staff, patient more awake now. Upon entering the room, I was able to wake by calling her name and then pressing on her shoulder. Once awake, patient did answer my questions and then she fell asleep. MOE to command, she presents the same way now as she did previously. VSS. Patient stated that she would like something to eat. She informed that she normally sleeps more during the day, she did not appear distressed.   Interventions: -- No RRT Interventions   Plan of Care: -- HD pending to start 1800  -- MD ordered CT CHEST PE as well. -- Monitor VS and neuro status -- Encourage OOB -- IS and Pulmonary Toileting as tolerated  Event Summary:  Call Time [place]  Brelyn Woehl R

## 2018-10-23 NOTE — ED Provider Notes (Signed)
[facility] EMERGENCY DEPARTMENT Provider Note   CSN: [id number] Arrival date & time: 10/23/18  3662     History   Chief Complaint Chief Complaint  Patient presents with  . Shortness of Breath    HPI Saroya Steines is a 53 y.o. female with PMHx diabetes, HTN, HLD, CAD, ESRD on dialysis T Th S, with recent covid 19 infection who presents to the ED via EMS complaining of an onset, constant, shortness of breath that began around 3 AM this morning.  Patient states she again started coughing around 7 AM this morning as well.  Triage note patient's glucose level was above 500.  It does not appear that she has been taking her insulin.  Patient has not missed any appointments for her dialysis previously. Most recent Saturday.   Chart review patient was recently discharged from the hospital on 9/12 after having respiratory failure secondary to volume overload, metabolic encephalopathy, acute on chronic anemia receiving 3 units PRBCs with Hgb 5.9.Recent prior hospitalization from 8/13 to 8/26 for covid 19 with pneumonia.  There was findings of worsening pneumonia during ED visit on 9/7 but it does appear that this improved during hospitalization.       Past Medical History:  Diagnosis Date  . Anemia   . Arthritis    "knees"  . CAD (coronary artery disease)    Nonobstructive on CT 2019  . ESRD (end stage renal disease) ([place])    Dialysis M/W/F  . Headache(784.0)   . High cholesterol   . Hypertension   . 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 Pericardial effusion   . Pneumonia    x 2   . PVD (peripheral vascular disease) ([place])    Right leg stent in [address].  (No records)  . Restless legs   . Sleep apnea    does not use Cpap  . Type II diabetes mellitus [facility])     Patient Active Problem List   Diagnosis Date Noted  . Shortness of breath 10/23/2018  . Acute respiratory distress 10/23/2018  . Chronic bilateral pleural effusions  10/23/2018  . Obesity 10/23/2018  . Pneumonia 10/10/2018  . COVID-19 virus detected 10/10/2018  . Acute encephalopathy 10/09/2018  . ESRD (end stage renal disease) ([place]) 09/27/2018  . Acute respiratory failure with hypoxia ([place]) 09/26/2018  . Chronic pain disorder 09/26/2018  . COVID-19 virus infection 09/18/2018  . Acute metabolic encephalopathy 94/76/5465  . Gangrene ([place]) 09/18/2018  . Left arm pain 06/27/2018  . Educated About Covid-19 Virus Infection 06/27/2018  . Anemia 09/26/2017  . Left hip pain 09/26/2017  . Neck pain 09/26/2017  . Leukocytosis 09/26/2017  . Chest pain 07/07/2017  . Left ventricular hypertrophy 06/17/2017  . ESRD on dialysis ([place]) 05/21/2011  . Diabetic retinopathy 05/21/2011  . Diastolic dysfunction 03/54/6568  . Hyperlipidemia, mixed 01/30/2011  . Type II diabetes mellitus with complication, uncontrolled ([place]) 01/29/2011  . Neuropathy 01/29/2011  . Hypertension     Past Surgical History:  Procedure Laterality Date  . AV FISTULA PLACEMENT Left   . [place]; 1997   x 2  . COLONOSCOPY    . EYE SURGERY Bilateral    cataract surgery  . INSERTION OF DIALYSIS CATHETER Right 09/26/2018   Procedure: INSERTION OF DIALYSIS CATHETER Right Internal Jugular.;  Surgeon: Elam Dutch, MD;  Location: [facility];  Service: Vascular;  Laterality: Right;  . LIGATION OF ARTERIOVENOUS  FISTULA Left  09/26/2018   Procedure: LIGATION OF ARTERIOVENOUS  FISTULA LEFT ARM;  Surgeon: Elam Dutch, MD;  Location: [facility];  Service: Vascular;  Laterality: Left;  . PERICARDIAL WINDOW  02/2004   for pericardial effusion  . TUBAL LIGATION  05/1995     OB History   No obstetric history on file.      Home Medications    Prior to Admission medications   Medication Sig Start Date End Date Taking? Authorizing Provider  atorvastatin (LIPITOR) 80 MG tablet Take 1 tablet (80 mg total) by mouth daily. Take 1/2 tab daily for one week, then increase to the full tab.  Patient taking differently: Take 80 mg by mouth daily at 6 PM.  06/01/17  Yes Tereasa Coop, PA-C  AURYXIA 1 GM 210 MG(Fe) tablet Take 630 mg by mouth 3 (three) times daily with meals.  05/29/18  Yes [provider]  b complex-vitamin c-folic acid (NEPHRO-VITE) 0.8 MG TABS tablet Take 1 tablet by mouth Every Tuesday,Thursday,and Saturday with dialysis.  07/17/18  Yes [provider]  benzonatate (TESSALON) 200 MG capsule Take 1 capsule (200 mg total) by mouth 3 (three) times daily as needed for cough. 09/27/18  Yes Sheikh, Omair Latif, DO  diphenoxylate-atropine (LOMOTIL) 2.5-0.025 MG tablet Take 1 tablet by mouth 4 (four) times daily as needed for diarrhea or loose stools. 09/06/17  Yes Kirichenko, Tatyana, PA-C  hydrOXYzine (ATARAX/VISTARIL) 25 MG tablet Take 25 mg by mouth daily.  05/12/17  Yes [provider]  Insulin Lispro Prot & Lispro (HUMALOG MIX 75/25 KWIKPEN) (75-25) 100 UNIT/ML Kwikpen Inject 25-30 Units into the skin 2 (two) times daily with a meal.    Yes [provider]  metoprolol succinate (TOPROL-XL) 25 MG 24 hr tablet Take 1 tablet (25 mg total) by mouth daily. Patient taking differently: Take 100 mg by mouth daily.  06/01/17  Yes Tereasa Coop, PA-C  oxyCODONE (ROXICODONE) 15 MG immediate release tablet Take 1 tablet (15 mg total) by mouth daily as needed for pain. 09/20/18  Yes Mariel Aloe, MD  pantoprazole (PROTONIX) 40 MG tablet Take 1 tablet (40 mg total) by mouth daily. 10/14/18 11/13/18 Yes Arrien, Jimmy Picket, MD  pramipexole (MIRAPEX) 0.5 MG tablet Take 0.5 mg by mouth daily.  05/18/17  Yes [provider]  vitamin C (VITAMIN C) 500 MG tablet Take 1 tablet (500 mg total) by mouth daily. Patient taking differently: Take 500 mg by mouth at bedtime.  09/21/18  Yes Mariel Aloe, MD  zinc sulfate 220 (50 Zn) MG capsule Take 1 capsule (220 mg total) by mouth daily. 09/21/18  Yes Mariel Aloe, MD    Family History Family  History  Problem Relation Age of Onset  . Cancer Brother   . Heart disease Father        Died age 56  . Diabetes Father   . Hyperlipidemia Father   . Hypertension Father   . Stroke Father   . Diabetes Mother   . Hypertension Mother   . Stroke Mother   . Dementia Mother   . Diabetes Sister   . Diabetes Brother   . Hypertension Sister   . Hypertension Brother     Social History Social History   Tobacco Use  . Smoking status: Current Every Day Smoker    Packs/day: 0.50    Years: 20.00    Pack years: 10.00    Types: Cigarettes  . Smokeless tobacco: Never Used  . Tobacco comment:  Trying to quit  Substance Use Topics  . Alcohol use: No  . Drug use: No     Allergies   Patient has no known allergies.   Review of Systems Review of Systems  Constitutional: Negative for chills and fever.  HENT: Negative for congestion.   Eyes: Negative for visual disturbance.  Respiratory: Positive for cough and shortness of breath.   Cardiovascular: Negative for chest pain.  Gastrointestinal: Negative for abdominal pain, nausea and vomiting.  Genitourinary: Negative for frequency.  Musculoskeletal: Negative for myalgias.  Skin: Negative for rash.  Neurological: Negative for headaches.     Physical Exam Updated Vital Signs BP (!) 175/105 (BP Location: Right Arm)   Pulse 75   Temp 97.9 F (36.6 C) (Oral)   Resp (!) 23   LMP  (LMP Unknown)   SpO2 96%   Physical Exam Vitals signs and nursing note reviewed.  Constitutional:      Appearance: She is obese. She is not ill-appearing.  HENT:     Head: Normocephalic and atraumatic.  Eyes:     Conjunctiva/sclera: Conjunctivae normal.  Neck:     Musculoskeletal: Neck supple.  Cardiovascular:     Rate and Rhythm: Normal rate and regular rhythm.  Pulmonary:     Effort: Tachypnea present.     Breath sounds: Rhonchi present. No decreased breath sounds, wheezing or rales.  Chest:     Chest wall: No tenderness.  Abdominal:      Palpations: Abdomen is soft.     Tenderness: There is no abdominal tenderness. There is no guarding or rebound.  Musculoskeletal:     Right lower leg: Edema present.     Left lower leg: Edema present.     Comments: 1+ pitting edema bilaterally.  Skin:    General: Skin is warm and dry.  Neurological:     Mental Status: She is alert.      ED Treatments / Results  Labs (all labs ordered are listed, but only abnormal results are displayed) Labs Reviewed  SARS CORONAVIRUS 2 ([facility]) - Abnormal; Notable for the following components:      Result Value   SARS Coronavirus 2 POSITIVE (*)    All other components within normal limits  BASIC METABOLIC PANEL - Abnormal; Notable for the following components:   Chloride 95 (*)    Glucose, Bld 378 (*)    BUN 36 (*)    Creatinine, Ser 6.50 (*)    GFR calc non Af Amer 7 (*)    GFR calc Af Amer 8 (*)    All other components within normal limits  CBC WITH DIFFERENTIAL/PLATELET - Abnormal; Notable for the following components:   RBC 2.64 (*)    Hemoglobin 8.4 (*)    HCT 26.6 (*)    MCV 100.8 (*)    RDW 16.8 (*)    All other components within normal limits  D-DIMER, QUANTITATIVE (NOT AT [facility]) - Abnormal; Notable for the following components:   D-Dimer, Quant 2.92 (*)    All other components within normal limits  BRAIN NATRIURETIC PEPTIDE - Abnormal; Notable for the following components:   B Natriuretic Peptide 2,159.1 (*)    All other components within normal limits  I-STAT CHEM 8, ED - Abnormal; Notable for the following components:   Chloride 96 (*)    BUN 35 (*)    Creatinine, Ser 6.30 (*)    Glucose, Bld 370 (*)    Calcium, Ion 1.13 (*)  Hemoglobin 8.8 (*)    HCT 26.0 (*)    All other components within normal limits  POCT I-STAT EG7 - Abnormal; Notable for the following components:   Bicarbonate 28.6 (*)    Acid-Base Excess 3.0 (*)    Calcium, Ion 1.14 (*)    HCT 27.0 (*)     Hemoglobin 9.2 (*)    All other components within normal limits  CBG MONITORING, ED - Abnormal; Notable for the following components:   Glucose-Capillary 293 (*)    All other components within normal limits  TROPONIN I (HIGH SENSITIVITY) - Abnormal; Notable for the following components:   Troponin I (High Sensitivity) 50 (*)    All other components within normal limits  TROPONIN I (HIGH SENSITIVITY) - Abnormal; Notable for the following components:   Troponin I (High Sensitivity) 49 (*)    All other components within normal limits  CULTURE, BLOOD (ROUTINE X 2)  CULTURE, BLOOD (ROUTINE X 2)  CULTURE, BLOOD (ROUTINE X 2)  CULTURE, BLOOD (ROUTINE X 2)  LACTIC ACID, PLASMA  TSH  MAGNESIUM  PHOSPHORUS  CBC  CREATININE, SERUM  I-STAT ARTERIAL BLOOD GAS, ED    EKG None  Radiology Dg Chest Port 1 View  Result Date: 10/23/2018 CLINICAL DATA:  Shortness of breath. EXAM: PORTABLE CHEST 1 VIEW COMPARISON:  ONE-VIEW CHEST X-RAY 10/11/2018 FINDINGS: The heart is enlarged. A right IJ dialysis catheter is stable. Interstitial edema and bilateral effusions have increased. Bibasilar airspace disease has progressed, left greater than right. Lung volumes remain low. IMPRESSION: 1. Cardiomegaly with increasing interstitial edema and bilateral pleural effusions consistent with progressive congestive heart failure. 2. Bibasilar airspace disease likely reflects atelectasis. Infection is not excluded. 3. Dialysis catheter is stable. Electronically Signed   By: San Morelle M.D.   On: 10/23/2018 10:20    Procedures Procedures (including critical care time)  Medications Ordered in ED Medications  nitroGLYCERIN 50 mg in dextrose 5 % 250 mL (0.2 mg/mL) infusion (10 mcg/min Intravenous Rate/Dose Change 10/23/18 1346)  Chlorhexidine Gluconate Cloth 2 % PADS 6 each (has no administration in time range)  enoxaparin (LOVENOX) injection 30 mg (has no administration in time range)  acetaminophen  (TYLENOL) tablet 650 mg (has no administration in time range)    Or  acetaminophen (TYLENOL) suppository 650 mg (has no administration in time range)  ondansetron (ZOFRAN) tablet 4 mg (has no administration in time range)    Or  ondansetron (ZOFRAN) injection 4 mg (has no administration in time range)  atorvastatin (LIPITOR) tablet 80 mg (has no administration in time range)  metoprolol succinate (TOPROL-XL) 24 hr tablet 25 mg (has no administration in time range)  ferric citrate (AURYXIA) tablet 630 mg (has no administration in time range)  pantoprazole (PROTONIX) EC tablet 40 mg (has no administration in time range)  insulin detemir (LEVEMIR) injection 12 Units (has no administration in time range)  insulin aspart (novoLOG) injection 3 Units (has no administration in time range)  cefTRIAXone (ROCEPHIN) 1 g in sodium chloride 0.9 % 100 mL IVPB (0 g Intravenous Stopped 10/23/18 1204)  azithromycin (ZITHROMAX) 500 mg in sodium chloride 0.9 % 250 mL IVPB (0 mg Intravenous Stopped 10/23/18 1324)  LORazepam (ATIVAN) injection 0.5 mg (0.5 mg Intravenous Given 10/23/18 1330)     Initial Impression / Assessment and Plan / ED Course  I have reviewed the triage vital signs and the nursing notes.  Pertinent labs & imaging results that were available during my care of the patient were reviewed  by me and considered in my medical decision making (see chart for details).  53 year old female who presents the ED complaining of worsening shortness of breath began last night around 3 AM.  He discharge from hospital 9/12 with respiratory failure secondary to metabolic encephalopathy, recurrent COVID 19 infection after being diagnosed during previous hospitalization with COVID pneumonia.  Reports she was doing well until last night when she began feeling very short of breath.  Patient is appearing to work very hard to breathe despite satting above 96% on room air.  He was initially alert and oriented x4 without any  confusion but began to progressively get more confused during hospital stay.  Work-up initiated with concerns for possible CO2 retention, infection, DKA.  Recent COVID-19 infection BiPAP was held.  We will obtain an ABG at this time to get further information.  ABG without acidosis.  Potassium within normal limits.  Creatinine elevated but consistent with patient's ESRD.  BUN 36.  Globin remained stable from previous.  Patient did receive 3 units packed red blood cells during last hospitalization.  Lactic acid within normal limits.  Chest x-ray did show bilateral pleural effusions but they could not rule out an infection.  Patient was started empirically on Rocephin and zithromax.  Does not seem to be a clear indication as to what is causing patient's worsening shortness of breath and confusion.  Will add CT head at this time.  We will also discuss case with nephrology given patient is dialysis patient and is scheduled to receive her dialysis tomorrow.  She has not missed any appointments.  During last hospitalization she required early dialysis to help with fluid overload.  Feel that this will benefit patient before tomorrow.  Discussed case with nephrologist who will dialyze today.  Feel patient will need to be admitted at this time.  Still awaiting COVID-19 test and remaining of labs. Have discussed case with hospitalist Dr. Doristine Bosworth who agrees to accept patient for admission.  This note was prepared using Dragon voice recognition software and may include unintentional dictation errors due to the inherent limitations of voice recognition software.   Clinical Course as of Oct 22 1400  Mon Oct 23, 2018  1138 Discussed case with nephrologist Dr. Carolin Sicks; pt to be dialyzed today   [MV]  1231 SARS Coronavirus 2(!): POSITIVE [MV]    Clinical Course User Index [MV] Eustaquio Maize, PA-C         Final Clinical Impressions(s) / ED Diagnoses   Final diagnoses:  CLEXN-17  ESRD (end stage renal  disease) ([place])  Acute respiratory failure, unspecified whether with hypoxia or hypercapnia [facility])    ED Discharge Orders    None       Eustaquio Maize, PA-C 10/23/18 1402    Maudie Flakes, MD 10/27/18 1544

## 2018-10-23 NOTE — Progress Notes (Signed)
Spoke with MD and stated dialysis is priority over CT scan at this time, dialysis planned to come at 6pm. Pt more arousable and woke up asking for food. IV obtained and Nitro gtt started back.

## 2018-10-23 NOTE — Progress Notes (Signed)
Assessed BUE with [place]. Very limited venous access. PIV placed at this time. This nurse notified nurse that MD needs to be aware a CVC would provide better access. VU. Fran Lowes, RN VAST

## 2018-10-23 NOTE — Progress Notes (Signed)
Pt arrived from ED lethargic without IV access. Pt on nitro gtt which was stopped due to IV being lost in transport. Pt non responsive to painful stimuli, but sats are 100% on 2L. MD made aware and waiting for response, rapid response called to make aware of situation and IV team called to make IV placement STAT. Will cont to monitor.

## 2018-10-23 NOTE — Progress Notes (Signed)
MD aware that pt is too lethargic to swallow pills at this time, spoke with pts husband and gave updates.

## 2018-10-23 NOTE — Progress Notes (Signed)
Inpatient Diabetes Program Recommendations  AACE/ADA: New Consensus Statement on Inpatient Glycemic Control (2015)  Target Ranges:  Prepandial:   less than 140 mg/dL      Peak postprandial:   less than 180 mg/dL (1-2 hours)      Critically ill patients:  140 - 180 mg/dL   Lab Results  Component Value Date   GLUCAP 183 (H) 10/14/2018   HGBA1C 8.7 (H) 09/19/2018    Review of Glycemic Control Results for [name], [name] (MRN [id number]) as of 10/23/2018 13:29  Ref. Range 10/23/2018 09:50 10/23/2018 10:43  Glucose Latest Ref Range: 70 - 99 mg/dL 378 (H) 370 (H)   Diabetes history: DM 2 Outpatient Diabetes medications:  Humalog 75/25 25-30 units bid Current orders for Inpatient glycemic control:  Novolog moderate tid with meals and HS  Inpatient Diabetes Program Recommendations:    Based on home dose of 75/25, please consider adding Levemir 12 units bid.  Also consider adding Novolog meal coverage 3 units tid with meals.   Thanks  Adah Perl, RN, BC-ADM Inpatient Diabetes Coordinator Pager [phone] (8a-5p)

## 2018-10-23 NOTE — ED Notes (Signed)
CBG 293 

## 2018-10-23 NOTE — ED Triage Notes (Addendum)
Sob starting last night- dialysis pt who went on Saturday. Diabetic. Has not taken meds. SUgar was 555. Sats normal and lungs clear.

## 2018-10-24 DIAGNOSIS — U071 COVID-19: Secondary | ICD-10-CM

## 2018-10-24 DIAGNOSIS — E785 Hyperlipidemia, unspecified: Secondary | ICD-10-CM

## 2018-10-24 LAB — BASIC METABOLIC PANEL
Anion gap: 16 — ABNORMAL HIGH (ref 5–15)
BUN: 23 mg/dL — ABNORMAL HIGH (ref 6–20)
CO2: 22 mmol/L (ref 22–32)
Calcium: 8.8 mg/dL — ABNORMAL LOW (ref 8.9–10.3)
Chloride: 96 mmol/L — ABNORMAL LOW (ref 98–111)
Creatinine, Ser: 4.72 mg/dL — ABNORMAL HIGH (ref 0.44–1.00)
GFR calc Af Amer: 11 mL/min — ABNORMAL LOW (ref >60)
GFR calc non Af Amer: 10 mL/min — ABNORMAL LOW (ref >60)
Glucose, Bld: 271 mg/dL — ABNORMAL HIGH (ref 70–99)
Potassium: 4.9 mmol/L (ref 3.5–5.1)
Sodium: 134 mmol/L — ABNORMAL LOW (ref 135–145)

## 2018-10-24 LAB — CBC
HCT: 28 % — ABNORMAL LOW (ref 36.0–46.0)
Hemoglobin: 8.7 g/dL — ABNORMAL LOW (ref 12.0–15.0)
MCH: 30.9 pg (ref 26.0–34.0)
MCHC: 31.1 g/dL (ref 30.0–36.0)
MCV: 99.3 fL (ref 80.0–100.0)
Platelets: 207 10*3/uL (ref 150–400)
RBC: 2.82 MIL/uL — ABNORMAL LOW (ref 3.87–5.11)
RDW: 16.5 % — ABNORMAL HIGH (ref 11.5–15.5)
WBC: 9.3 10*3/uL (ref 4.0–10.5)
nRBC: 0 % (ref 0.0–0.2)

## 2018-10-24 LAB — GLUCOSE, CAPILLARY
Glucose-Capillary: 306 mg/dL — ABNORMAL HIGH (ref 70–99)
Glucose-Capillary: 318 mg/dL — ABNORMAL HIGH (ref 70–99)
Glucose-Capillary: 427 mg/dL — ABNORMAL HIGH (ref 70–99)

## 2018-10-24 MED ORDER — DARBEPOETIN ALFA 200 MCG/0.4ML IJ SOSY
PREFILLED_SYRINGE | INTRAMUSCULAR | Status: AC
  Filled 2018-10-24: qty 0.4

## 2018-10-24 MED ORDER — HEPARIN SODIUM (PORCINE) 1000 UNIT/ML IJ SOLN
INTRAMUSCULAR | Status: AC
  Filled 2018-10-24: qty 4

## 2018-10-24 MED ORDER — SODIUM CHLORIDE 0.9 % IV SOLN
125.0000 mg | INTRAVENOUS | Status: DC
  Administered 2018-10-24: 125 mg via INTRAVENOUS
  Filled 2018-10-24 (×2): qty 10

## 2018-10-24 MED ORDER — HEPARIN SODIUM (PORCINE) 1000 UNIT/ML IJ SOLN
3.4000 mL | Freq: Once | INTRAMUSCULAR | Status: AC
  Administered 2018-10-24: 3400 [IU] via INTRAVENOUS
  Filled 2018-10-24: qty 3.4

## 2018-10-24 MED ORDER — HEPARIN SODIUM (PORCINE) 5000 UNIT/ML IJ SOLN
5000.0000 [IU] | Freq: Three times a day (TID) | INTRAMUSCULAR | Status: DC
  Administered 2018-10-24 – 2018-10-25 (×4): 5000 [IU] via SUBCUTANEOUS
  Filled 2018-10-24 (×4): qty 1

## 2018-10-24 NOTE — Progress Notes (Signed)
[facility] NEPHROLOG[facility] PROGRESS NOTE  Assessment/ Plan: Pt is a 53 y.o. yo female  with ESRD on HD TTS, DM, HTN, CAD, admitted with fluid overload and persistent cough with positive COVID-19.  Chest x-ray with pulmonary edema and infiltrates. Dialysis Orders:  [facility] TTS 4h 450/800 EDW 108kg 2K/2Ca  Heparin 10000 U Venofer 100mg  IV x 5 (To start 9/22) Mircera 225 mcg IV q 2 weeks (has not received d/t hospital admissions)  Calcitriol 1.5 mcg PO TIW   #Dyspnea due to COVID-19 pneumonia and pulmonary edema: Status post urgent dialysis yesterday with 3.5 L ultrafiltration.  Plan for regular treatment today.  Management of COVID per primary team.  Patient is on room air.  # ESRD: TTS schedule.  Regular dialysis today.  UF goal 2 L, IV heparin.  Right IJ TDC for the access.  # Anemia: Continue ESA.  Iron saturation 16% on 9/16.  Start IV iron during dialysis.  Monitor CBC.  # Secondary hyperparathyroidism: Continue calcitriol, Auryxia.  # HTN/volume: Resume home medication.  Ultrafiltration during dialysis.  Subjective: Overnight event noted.  CT chest with groundglass opacity.  Received dialysis.  Plan for regular dialysis today.  Discussed with the primary team Dr. Cathlean Sauer.  Chart and lab results reviewed.  Patient was not directly examined because of COVID-19. Objective Vital signs in last 24 hours: Vitals:   10/24/18 0354 10/24/18 0400 10/24/18 0430 10/24/18 0725  BP:  (!) 145/87 (!) 160/80 (!) 167/80  Pulse:  77  85  Resp:    18  Temp:    97.7 F (36.5 C)  TempSrc:    Oral  SpO2:  92%  96%  Weight: 106.5 kg     Height:       Weight change:   Intake/Output Summary (Last 24 hours) at 10/24/2018 0942 Last data filed at 10/24/2018 0336 Gross per 24 hour  Intake 23.23 ml  Output 3500 ml  Net -3476.77 ml       Labs: Basic Metabolic Panel: Recent Labs  Lab 10/23/18 0950 10/23/18 1043 10/23/18 1150 10/24/18 0527  NA 137 136  137  --  134*  K 4.4 4.3  4.3   --  4.9  CL 95* 96*  --  96*  CO2 27  --   --  22  GLUCOSE 378* 370*  --  271*  BUN 36* 35*  --  23*  CREATININE 6.50* 6.30*  --  4.72*  CALCIUM 9.2  --   --  8.8*  PHOS  --   --  3.8  --    Liver Function Tests: No results for input(s): AST, ALT, ALKPHOS, BILITOT, PROT, ALBUMIN in the last 168 hours. No results for input(s): LIPASE, AMYLASE in the last 168 hours. No results for input(s): AMMONIA in the last 168 hours. CBC: Recent Labs  Lab 10/23/18 0950 10/23/18 1043 10/24/18 0527  WBC 8.4  --  9.3  NEUTROABS 6.4  --   --   HGB 8.4* 8.8*  9.2* 8.7*  HCT 26.6* 26.0*  27.0* 28.0*  MCV 100.8*  --  99.3  PLT 193  --  207   Cardiac Enzymes: No results for input(s): CKTOTAL, CKMB, CKMBINDEX, TROPONINI in the last 168 hours. CBG: Recent Labs  Lab 10/23/18 1358 10/23/18 1612 10/23/18 2157 10/24/18 0724  GLUCAP 293* 267* 137* 306*    Iron Studies: No results for input(s): IRON, TIBC, TRANSFERRIN, FERRITIN in the last 72 hours. Studies/Results: Ct Head Wo Contrast  Result Date: 10/23/2018  CLINICAL DATA:  Altered level of consciousness. Severe shortness of breath beginning at 3 a.m. today. Positive for COVID-19 EXAM: CT HEAD WITHOUT CONTRAST TECHNIQUE: Contiguous axial images were obtained from the base of the skull through the vertex without intravenous contrast. COMPARISON:  CT head without contrast 09/18/2018 FINDINGS: Brain: Mild generalized atrophy and white matter disease is stable. No acute infarct, hemorrhage, or mass lesion is present. The ventricles are of normal size. No significant extraaxial fluid collection is present. The brainstem and cerebellum are within normal limits. Vascular: Atherosclerotic changes are noted within the cavernous internal carotid arteries bilaterally. There is no hyperdense vessel. Skull: Calvarium is intact. No focal lytic or blastic lesions are present. Sinuses/Orbits: The paranasal sinuses and mastoid air cells are clear. Bilateral lens  replacements are noted. Globes and orbits are otherwise unremarkable. IMPRESSION: 1. Stable appearance of mild generalized atrophy and white matter disease. This likely reflects the sequela of chronic microvascular ischemia. 2. No acute intracranial abnormality. 3. Atherosclerosis. Electronically Signed   By: San Morelle M.D.   On: 10/23/2018 14:41   Ct Angio Chest Pe W Or Wo Contrast  Result Date: 10/23/2018 CLINICAL DATA:  Pulmonary embolism (PE), suspected. COVID-19 positive. Worsening shortness of breath. EXAM: CT ANGIOGRAPHY CHEST WITH CONTRAST TECHNIQUE: Multidetector CT imaging of the chest was performed using the standard protocol during bolus administration of intravenous contrast. Multiplanar CT image reconstructions and MIPs were obtained to evaluate the vascular anatomy. CONTRAST:  17mL OMNIPAQUE IOHEXOL 350 MG/ML SOLN COMPARISON:  Chest radiograph earlier this day. Lung bases from abdominal CT 10/13/2018 FINDINGS: Cardiovascular: There are no filling defects within the pulmonary arteries to suggest pulmonary embolus. There is dilatation of the main pulmonary artery of 4.4 cm. Thoracic aorta is normal in caliber with moderate atherosclerosis. Multi chamber cardiomegaly. There are coronary artery calcifications. No pericardial effusion. Contrast refluxes into the hepatic veins and IVC. Right internal jugular dialysis catheter tip in the right atrium. 1 Mediastinum/Nodes: Prominent right hilar node measuring 12 mm. There is small mediastinal nodes. No visualized thyroid nodule. Enlarged left axillary node measuring 17 mm, series 5, image 13. Additional prominent nodes in the left axilla, partially included. Decompressed esophagus. Lungs/Pleura: There are patchy ground-glass opacities within the right middle lobe and both lower lobes, more prominent than on prior abdominal CT in the lung bases. Linear atelectasis in the left upper lobe. No definite septal thickening. No confluent airspace  disease. Trace atypical emphysema. The trachea and mainstem bronchi are patent. Upper Abdomen: Multiple splenic lesions which were better defined on prior abdominal CT secondary to phase of contrast imaging. Heterogeneous lesion in the upper right kidney as seen on prior CT. Bilateral renal parenchymal atrophy. Musculoskeletal: There are no acute or suspicious osseous abnormalities. Degenerative change in the spine. Review of the MIP images confirms the above findings. IMPRESSION: 1. No pulmonary embolus. 2. Patchy ground-glass opacities in the right middle lobe and both lower lobes, more prominent than on prior abdominal CT in the lung bases. Pulmonary edema or atypical viral infection in this patient with known COVID-19. 3. Cardiomegaly with reflux of contrast into the hepatic veins and IVC consistent with elevated right heart pressures. Dilated main pulmonary artery suggesting pulmonary arterial hypertension. 4. Right renal mass as well as splenic lesions are unchanged from recent abdominal CT. Prominent left axillary nodes, largest measuring 17 mm short axis. This is nonspecific and may be reactive, however recommend up-to-date mammography when patient is able. Aortic Atherosclerosis (ICD10-I70.0). Electronically Signed   By: Threasa Beards  Sanford M.D.   On: 10/23/2018 23:08   Dg Chest Port 1 View  Result Date: 10/23/2018 CLINICAL DATA:  Shortness of breath. EXAM: PORTABLE CHEST 1 VIEW COMPARISON:  ONE-VIEW CHEST X-RAY 10/11/2018 FINDINGS: The heart is enlarged. A right IJ dialysis catheter is stable. Interstitial edema and bilateral effusions have increased. Bibasilar airspace disease has progressed, left greater than right. Lung volumes remain low. IMPRESSION: 1. Cardiomegaly with increasing interstitial edema and bilateral pleural effusions consistent with progressive congestive heart failure. 2. Bibasilar airspace disease likely reflects atelectasis. Infection is not excluded. 3. Dialysis catheter is stable.  Electronically Signed   By: San Morelle M.D.   On: 10/23/2018 10:20    Medications: Infusions: . nitroGLYCERIN 15 mcg/min (10/24/18 0201)    Scheduled Medications: . atorvastatin  80 mg Oral Daily  . calcitRIOL  1.5 mcg Oral Q T,Th,Sa-HD  . Chlorhexidine Gluconate Cloth  6 each Topical Q0600  . darbepoetin (ARANESP) injection - DIALYSIS  200 mcg Intravenous Q Tue-HD  . dexamethasone  6 mg Oral Daily  . feeding supplement (PRO-STAT SUGAR FREE 64)  30 mL Oral BID  . ferric citrate  630 mg Oral TID WC  . heparin injection (subcutaneous)  5,000 Units Subcutaneous Q8H  . insulin aspart  3 Units Subcutaneous TID WC  . insulin detemir  12 Units Subcutaneous BID  . metoprolol succinate  25 mg Oral Daily  . multivitamin  1 tablet Oral QHS  . pantoprazole  40 mg Oral Daily    have reviewed scheduled and prn medications.   Dron Prasad Bhandari 10/24/2018,9:42 AM  LOS: 1 day  Pager: [phone]

## 2018-10-24 NOTE — Consult Note (Signed)
[facility] Nurse wound consult note Patient receiving care in [place].  Patient is COVID +. Reason for Consult: Left hand wound Wound type: unclear at this time.  Note from Dr. 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  And, the patient was being seen by Dr. Dellia Nims at the outpatient [place]; I do not have access to those notes.  I have entered an order for the RN to page me when she is available to discuss the wound.  Currently, there are no images in the record. Pressure Injury POA: Yes/No/NA  Val Riles, RN, MSN, CWOCN, CNS-BC, pager [phone]

## 2018-10-24 NOTE — Progress Notes (Signed)
Inpatient Diabetes Program Recommendations  AACE/ADA: New Consensus Statement on Inpatient Glycemic Control (2015)  Target Ranges:  Prepandial:   less than 140 mg/dL      Peak postprandial:   less than 180 mg/dL (1-2 hours)      Critically ill patients:  140 - 180 mg/dL   Lab Results  Component Value Date   GLUCAP 306 (H) 10/24/2018   HGBA1C 8.7 (H) 09/19/2018    Review of Glycemic Control Results for [name], [name] (MRN [id number]) as of 10/24/2018 13:14  Ref. Range 10/14/2018 10:59 10/23/2018 13:58 10/23/2018 16:12 10/23/2018 21:57 10/24/2018 07:24  Glucose-Capillary Latest Ref Range: 70 - 99 mg/dL 183 (H) 293 (H) 267 (H) 137 (H) 306 (H)  Diabetes history: DM 2 Outpatient Diabetes medications:  Humalog 75/25 25-30 units bid Current orders for Inpatient glycemic control:  Levemir 12 units bid, Novolog 3 units tid with meals Inpatient Diabetes Program Recommendations:    Please consider increasing Levemir to 16 units bid.  Also please add Novolog correction sensitive tid with meals (in addition to meal coverage).   Thanks  Adah Perl, RN, BC-ADM Inpatient Diabetes Coordinator Pager [phone] (8a-5p)

## 2018-10-24 NOTE — Progress Notes (Signed)
PROGRESS NOTE    [name]  ZOX:[id number] DOB: 09/11/1965 DOA: 10/23/2018 PCP: Sandi Mariscal, MD    Brief Narrative:  53 year old female who presented with dyspnea.  She does have significant past medical history of end-stage renal disease on hemodialysis, T, T, S.  Type 2 diabetes mellitus, hypertension, anemia chronic kidney disease, dyslipidemia and coronary artery disease.  Patient reported acute onset of dyspnea, no orthopnea, no PND or lower extremity edema.  Recent hospitalization September 7 due to acute metabolic encephalopathy related to SARS COVID-19 infection.  On her initial physical examination blood pressure 205/155, pulse rate 86, respiratory rate 25, oxygen saturation 99% on supplemental 02 per Gamaliel, her lungs had no significant wheezing or rales, heart S1-S2 present and rhythmic, abdomen soft nontender, no lower extremity edema.  First COVID-19 was positive.  Chest radiograph with cardiomegaly, bibasilar interstitial infiltrates, cephalization of vasculature, small bilateral pleural effusions.  CT chest with bilateral pleural plaques opacities.  Patient was admitted to the hospital working diagnosis of acute hypoxic respiratory failure due to volume overload related to end-stage renal disease on hemodialysis in the setting of acute on chronic diastolic heart failure.  Patient received urgent hemodialysis and ultrafiltration for volume overload.  She was kept on respiratory isolation due to SARS COVID-19 positivity.     Assessment & Plan:   Principal Problem:   Acute respiratory distress Active Problems:   Hypertension   Type II diabetes mellitus with complication, uncontrolled (HCC)   Hyperlipidemia   ESRD on dialysis ([place])   Anemia   ESRD (end stage renal disease) ([place])   COVID-19 virus detected   Obesity   Coronary artery disease   Tobacco abuse   Volume overload   1. Acute hypoxic respiratory failure due to volume overload acute pulmonary edema due to ESRD.  Patient had urgent HD yesterday with ultrafiltration, this am continue volume overloaded. Oxymetry is 92% on room air. Blood pressure 811 to 914 systolic. Plan for HD with UF today, discussed with nephrology, Dr. Rica Mast. Target negative fluid balance. No clinical signs of pneumonia, will hold antibiotic therapy and will check chest film in am after ultrafiltration.   2. COVID 19 positive. Patient with no signs of active disease, will continue close monitoring, not candidate for steroids or remdesivir at this point.   3. Hypertensive urgency. Likely due to volume overload, will continue ultrafiltration and blood pressure monitoring.   4. T2DM. Will continue insulin regimen, with short and basal insulin, continue capillary glucose monitoring.   5. Anemia of chronic renal disease. Hgb and hct stable, no signs of bleeding.    DVT prophylaxis: scd   Code Status:  full Family Communication: no family at the bedside  Disposition Plan/ discharge barriers: pending clinical improvement.   Body mass index is 35.7 kg/m. Malnutrition Type:      Malnutrition Characteristics:      Nutrition Interventions:     RN Pressure Injury Documentation:     Consultants:   Nephrology   Procedures:     Antimicrobials:       Subjective: Patient is feeling cold this am, no chills or fever, dyspnea continue to improve but is not back to baseline, no chest pain.   Objective: Vitals:   10/24/18 0354 10/24/18 0400 10/24/18 0430 10/24/18 0725  BP:  (!) 145/87 (!) 160/80 (!) 167/80  Pulse:  77  85  Resp:    18  Temp:    97.7 F (36.5 C)  TempSrc:    Oral  SpO2:  92%  96%  Weight: 106.5 kg     Height:        Intake/Output Summary (Last 24 hours) at 10/24/2018 2952 Last data filed at 10/24/2018 8413 Gross per 24 hour  Intake 23.23 ml  Output 3500 ml  Net -3476.77 ml   Filed Weights   10/23/18 1820 10/23/18 2120 10/24/18 0354  Weight: 110.4 kg 107 kg 106.5 kg    Examination:    General: Not in pain or dyspnea, deconditioned  Neurology: Awake and alert, non focal  E ENT: mild pallor, no icterus, oral mucosa moist Cardiovascular: No JVD. S1-S2 present, rhythmic, no gallops, rubs, or murmurs. No lower extremity edema. Pulmonary: positive breath sounds bilaterally, adequate air movement, no wheezing, rhonchi or rales. Gastrointestinal. Abdomen with no organomegaly, non tender, no rebound or guarding Skin. No rashes Musculoskeletal: no joint deformities     Data Reviewed: I have personally reviewed following labs and imaging studies  CBC: Recent Labs  Lab 10/23/18 0950 10/23/18 1043 10/24/18 0527  WBC 8.4  --  9.3  NEUTROABS 6.4  --   --   HGB 8.4* 8.8*  9.2* 8.7*  HCT 26.6* 26.0*  27.0* 28.0*  MCV 100.8*  --  99.3  PLT 193  --  244   Basic Metabolic Panel: Recent Labs  Lab 10/23/18 0950 10/23/18 1043 10/23/18 1150 10/24/18 0527  NA 137 136  137  --  134*  K 4.4 4.3  4.3  --  4.9  CL 95* 96*  --  96*  CO2 27  --   --  22  GLUCOSE 378* 370*  --  271*  BUN 36* 35*  --  23*  CREATININE 6.50* 6.30*  --  4.72*  CALCIUM 9.2  --   --  8.8*  MG  --   --  2.1  --   PHOS  --   --  3.8  --    GFR: Estimated Creatinine Clearance: 17.4 mL/min (A) (by C-G formula based on SCr of 4.72 mg/dL (H)). Liver Function Tests: No results for input(s): AST, ALT, ALKPHOS, BILITOT, PROT, ALBUMIN in the last 168 hours. No results for input(s): LIPASE, AMYLASE in the last 168 hours. No results for input(s): AMMONIA in the last 168 hours. Coagulation Profile: No results for input(s): INR, PROTIME in the last 168 hours. Cardiac Enzymes: No results for input(s): CKTOTAL, CKMB, CKMBINDEX, TROPONINI in the last 168 hours. BNP (last 3 results) No results for input(s): PROBNP in the last 8760 hours. HbA1C: No results for input(s): HGBA1C in the last 72 hours. CBG: Recent Labs  Lab 10/23/18 1358 10/23/18 1612 10/23/18 2157 10/24/18 0724  GLUCAP 293* 267* 137*  306*   Lipid Profile: No results for input(s): CHOL, HDL, LDLCALC, TRIG, CHOLHDL, LDLDIRECT in the last 72 hours. Thyroid Function Tests: Recent Labs    10/23/18 1253  TSH 2.957   Anemia Panel: No results for input(s): VITAMINB12, FOLATE, FERRITIN, TIBC, IRON, RETICCTPCT in the last 72 hours.    Radiology Studies: I have reviewed all of the imaging during this hospital visit personally     Scheduled Meds: . atorvastatin  80 mg Oral Daily  . calcitRIOL  1.5 mcg Oral Q T,Th,Sa-HD  . Chlorhexidine Gluconate Cloth  6 each Topical Q0600  . darbepoetin (ARANESP) injection - DIALYSIS  200 mcg Intravenous Q Tue-HD  . dexamethasone  6 mg Oral Daily  . feeding supplement (PRO-STAT SUGAR FREE 64)  30 mL Oral BID  . ferric citrate  630 mg Oral TID WC  . insulin aspart  3 Units Subcutaneous TID WC  . insulin detemir  12 Units Subcutaneous BID  . metoprolol succinate  25 mg Oral Daily  . multivitamin  1 tablet Oral QHS  . pantoprazole  40 mg Oral Daily   Continuous Infusions: . nitroGLYCERIN 15 mcg/min (10/24/18 0201)     LOS: 1 day        Mauricio Gerome Apley, MD

## 2018-10-25 ENCOUNTER — Inpatient Hospital Stay (HOSPITAL_COMMUNITY): Payer: Medicare Other

## 2018-10-25 DIAGNOSIS — N186 End stage renal disease: Secondary | ICD-10-CM

## 2018-10-25 DIAGNOSIS — E118 Type 2 diabetes mellitus with unspecified complications: Secondary | ICD-10-CM

## 2018-10-25 DIAGNOSIS — I161 Hypertensive emergency: Secondary | ICD-10-CM

## 2018-10-25 DIAGNOSIS — I5031 Acute diastolic (congestive) heart failure: Secondary | ICD-10-CM

## 2018-10-25 DIAGNOSIS — J9601 Acute respiratory failure with hypoxia: Secondary | ICD-10-CM

## 2018-10-25 DIAGNOSIS — E1165 Type 2 diabetes mellitus with hyperglycemia: Secondary | ICD-10-CM

## 2018-10-25 DIAGNOSIS — Z992 Dependence on renal dialysis: Secondary | ICD-10-CM

## 2018-10-25 LAB — BASIC METABOLIC PANEL
Anion gap: 17 — ABNORMAL HIGH (ref 5–15)
BUN: 37 mg/dL — ABNORMAL HIGH (ref 6–20)
CO2: 24 mmol/L (ref 22–32)
Calcium: 9.3 mg/dL (ref 8.9–10.3)
Chloride: 90 mmol/L — ABNORMAL LOW (ref 98–111)
Creatinine, Ser: 5.24 mg/dL — ABNORMAL HIGH (ref 0.44–1.00)
GFR calc Af Amer: 10 mL/min — ABNORMAL LOW (ref >60)
GFR calc non Af Amer: 9 mL/min — ABNORMAL LOW (ref >60)
Glucose, Bld: 408 mg/dL — ABNORMAL HIGH (ref 70–99)
Potassium: 4.3 mmol/L (ref 3.5–5.1)
Sodium: 131 mmol/L — ABNORMAL LOW (ref 135–145)

## 2018-10-25 LAB — CBC WITH DIFFERENTIAL/PLATELET
Abs Immature Granulocytes: 0.06 10*3/uL (ref 0.00–0.07)
Basophils Absolute: 0.1 10*3/uL (ref 0.0–0.1)
Basophils Relative: 1 %
Eosinophils Absolute: 0.1 10*3/uL (ref 0.0–0.5)
Eosinophils Relative: 1 %
HCT: 27.8 % — ABNORMAL LOW (ref 36.0–46.0)
Hemoglobin: 8.6 g/dL — ABNORMAL LOW (ref 12.0–15.0)
Immature Granulocytes: 1 %
Lymphocytes Relative: 19 %
Lymphs Abs: 1.7 10*3/uL (ref 0.7–4.0)
MCH: 30.4 pg (ref 26.0–34.0)
MCHC: 30.9 g/dL (ref 30.0–36.0)
MCV: 98.2 fL (ref 80.0–100.0)
Monocytes Absolute: 0.5 10*3/uL (ref 0.1–1.0)
Monocytes Relative: 6 %
Neutro Abs: 6.5 10*3/uL (ref 1.7–7.7)
Neutrophils Relative %: 72 %
Platelets: 210 10*3/uL (ref 150–400)
RBC: 2.83 MIL/uL — ABNORMAL LOW (ref 3.87–5.11)
RDW: 16.3 % — ABNORMAL HIGH (ref 11.5–15.5)
WBC: 8.8 10*3/uL (ref 4.0–10.5)
nRBC: 0 % (ref 0.0–0.2)

## 2018-10-25 LAB — GLUCOSE, CAPILLARY
Glucose-Capillary: 375 mg/dL — ABNORMAL HIGH (ref 70–99)
Glucose-Capillary: 429 mg/dL — ABNORMAL HIGH (ref 70–99)

## 2018-10-25 LAB — HEPATITIS B SURFACE ANTIGEN: Hepatitis B Surface Ag: NEGATIVE

## 2018-10-25 MED ORDER — BLOOD GLUCOSE MONITOR KIT: PACK | 0 refills | Status: DC

## 2018-10-25 MED ORDER — INSULIN ASPART 100 UNIT/ML ~~LOC~~ SOLN
0.0000 [IU] | Freq: Three times a day (TID) | SUBCUTANEOUS | Status: DC
  Administered 2018-10-25: 9 [IU] via SUBCUTANEOUS

## 2018-10-25 MED ORDER — ASCORBIC ACID 500 MG PO TABS: 500.0000 mg | ORAL_TABLET | Freq: Every day | ORAL | Status: DC

## 2018-10-25 MED ORDER — METOPROLOL SUCCINATE ER 25 MG PO TB24: 100.0000 mg | ORAL_TABLET | Freq: Every day | ORAL | Status: DC

## 2018-10-25 MED ORDER — ATORVASTATIN CALCIUM 80 MG PO TABS: 80.0000 mg | ORAL_TABLET | Freq: Every day | ORAL | Status: DC

## 2018-10-25 MED ORDER — CALCITRIOL 0.5 MCG PO CAPS: 1.5000 ug | ORAL_CAPSULE | ORAL | 0 refills | Status: DC

## 2018-10-25 MED ORDER — CHLORHEXIDINE GLUCONATE CLOTH 2 % EX PADS: 6.0000 | MEDICATED_PAD | Freq: Every day | CUTANEOUS | Status: DC

## 2018-10-25 NOTE — Progress Notes (Addendum)
[facility] NEPHROLOGY PROGRESS NOTE  Assessment/ Plan: Pt is a 53 y.o. yo female  with ESRD on HD TTS, DM, HTN, CAD, admitted with fluid overload and persistent cough with positive COVID-19.  Chest x-ray with pulmonary edema and infiltrates. Dialysis Orders:  [facility] TTS 4h 450/800 EDW 108kg 2K/2Ca  Heparin 10000 U Venofer 100mg  IV x 5 (To start 9/22) Mircera 225 mcg IV q 2 weeks (has not received d/t hospital admissions)  Calcitriol 1.5 mcg PO TIW   #Dyspnea due to COVID-19 pneumonia and pulmonary edema: Required urgent dialysis on admission and regular dialysis yesterday.  She had 2 L ultrafiltration yesterday.  Tolerated well.  She is on room air today.   Management of COVID per primary team.    # ESRD: TTS schedule.  Plan for next dialysis tomorrow.  Right IJ TDC for the access.  If discharge patient will need to go to [facility] kidney center because of COVID positive.  # Anemia: Continue ESA.  Iron saturation 16% on 9/16.  IV iron during dialysis.  Monitor CBC.  # Secondary hyperparathyroidism: Continue calcitriol, Auryxia.  # HTN/volume: Resume home medication.  Ultrafiltration during dialysis.  # Indeterminate right renal mass - Found on imaging during prior admission. To f/u with urology as outpatient.    Subjective: Chart reviewed.  Tolerated dialysis well yesterday.  Discussed with the primary team.  No new event. Objective Vital signs in last 24 hours: Vitals:   10/24/18 1600 10/24/18 2338 10/25/18 0521 10/25/18 0858  BP: 133/61 (!) 160/99  (!) 176/85  Pulse: 81 83  75  Resp: 17 16  20   Temp: 98 F (36.7 C) 98.2 F (36.8 C)  98.2 F (36.8 C)  TempSrc: Oral Oral  Oral  SpO2: 98% 96%  98%  Weight: 106 kg  107.3 kg   Height:       Weight change: -1 kg  Intake/Output Summary (Last 24 hours) at 10/25/2018 1017 Last data filed at 10/25/2018 0319 Gross per 24 hour  Intake 240 ml  Output 2000 ml  Net -1760 ml       Labs: Basic Metabolic  Panel: Recent Labs  Lab 10/23/18 0950 10/23/18 1043 10/23/18 1150 10/24/18 0527 10/25/18 0738  NA 137 136  137  --  134* 131*  K 4.4 4.3  4.3  --  4.9 4.3  CL 95* 96*  --  96* 90*  CO2 27  --   --  22 24  GLUCOSE 378* 370*  --  271* 408*  BUN 36* 35*  --  23* 37*  CREATININE 6.50* 6.30*  --  4.72* 5.24*  CALCIUM 9.2  --   --  8.8* 9.3  PHOS  --   --  3.8  --   --    Liver Function Tests: No results for input(s): AST, ALT, ALKPHOS, BILITOT, PROT, ALBUMIN in the last 168 hours. No results for input(s): LIPASE, AMYLASE in the last 168 hours. No results for input(s): AMMONIA in the last 168 hours. CBC: Recent Labs  Lab 10/23/18 0950 10/23/18 1043 10/24/18 0527 10/25/18 0738  WBC 8.4  --  9.3 8.8  NEUTROABS 6.4  --   --  6.5  HGB 8.4* 8.8*  9.2* 8.7* 8.6*  HCT 26.6* 26.0*  27.0* 28.0* 27.8*  MCV 100.8*  --  99.3 98.2  PLT 193  --  207 210   Cardiac Enzymes: No results for input(s): CKTOTAL, CKMB, CKMBINDEX, TROPONINI in the last 168 hours. CBG: Recent  Labs  Lab 10/23/18 2157 10/24/18 0724 10/24/18 1627 10/24/18 2036 10/25/18 0853  GLUCAP 137* 306* 318* 427* 375*    Iron Studies: No results for input(s): IRON, TIBC, TRANSFERRIN, FERRITIN in the last 72 hours. Studies/Results: Ct Head Wo Contrast  Result Date: 10/23/2018 CLINICAL DATA:  Altered level of consciousness. Severe shortness of breath beginning at 3 a.m. today. Positive for COVID-19 EXAM: CT HEAD WITHOUT CONTRAST TECHNIQUE: Contiguous axial images were obtained from the base of the skull through the vertex without intravenous contrast. COMPARISON:  CT head without contrast 09/18/2018 FINDINGS: Brain: Mild generalized atrophy and white matter disease is stable. No acute infarct, hemorrhage, or mass lesion is present. The ventricles are of normal size. No significant extraaxial fluid collection is present. The brainstem and cerebellum are within normal limits. Vascular: Atherosclerotic changes are noted  within the cavernous internal carotid arteries bilaterally. There is no hyperdense vessel. Skull: Calvarium is intact. No focal lytic or blastic lesions are present. Sinuses/Orbits: The paranasal sinuses and mastoid air cells are clear. Bilateral lens replacements are noted. Globes and orbits are otherwise unremarkable. IMPRESSION: 1. Stable appearance of mild generalized atrophy and white matter disease. This likely reflects the sequela of chronic microvascular ischemia. 2. No acute intracranial abnormality. 3. Atherosclerosis. Electronically Signed   By: San Morelle M.D.   On: 10/23/2018 14:41   Ct Angio Chest Pe W Or Wo Contrast  Result Date: 10/23/2018 CLINICAL DATA:  Pulmonary embolism (PE), suspected. COVID-19 positive. Worsening shortness of breath. EXAM: CT ANGIOGRAPHY CHEST WITH CONTRAST TECHNIQUE: Multidetector CT imaging of the chest was performed using the standard protocol during bolus administration of intravenous contrast. Multiplanar CT image reconstructions and MIPs were obtained to evaluate the vascular anatomy. CONTRAST:  34mL OMNIPAQUE IOHEXOL 350 MG/ML SOLN COMPARISON:  Chest radiograph earlier this day. Lung bases from abdominal CT 10/13/2018 FINDINGS: Cardiovascular: There are no filling defects within the pulmonary arteries to suggest pulmonary embolus. There is dilatation of the main pulmonary artery of 4.4 cm. Thoracic aorta is normal in caliber with moderate atherosclerosis. Multi chamber cardiomegaly. There are coronary artery calcifications. No pericardial effusion. Contrast refluxes into the hepatic veins and IVC. Right internal jugular dialysis catheter tip in the right atrium. 1 Mediastinum/Nodes: Prominent right hilar node measuring 12 mm. There is small mediastinal nodes. No visualized thyroid nodule. Enlarged left axillary node measuring 17 mm, series 5, image 13. Additional prominent nodes in the left axilla, partially included. Decompressed esophagus. Lungs/Pleura:  There are patchy ground-glass opacities within the right middle lobe and both lower lobes, more prominent than on prior abdominal CT in the lung bases. Linear atelectasis in the left upper lobe. No definite septal thickening. No confluent airspace disease. Trace atypical emphysema. The trachea and mainstem bronchi are patent. Upper Abdomen: Multiple splenic lesions which were better defined on prior abdominal CT secondary to phase of contrast imaging. Heterogeneous lesion in the upper right kidney as seen on prior CT. Bilateral renal parenchymal atrophy. Musculoskeletal: There are no acute or suspicious osseous abnormalities. Degenerative change in the spine. Review of the MIP images confirms the above findings. IMPRESSION: 1. No pulmonary embolus. 2. Patchy ground-glass opacities in the right middle lobe and both lower lobes, more prominent than on prior abdominal CT in the lung bases. Pulmonary edema or atypical viral infection in this patient with known COVID-19. 3. Cardiomegaly with reflux of contrast into the hepatic veins and IVC consistent with elevated right heart pressures. Dilated main pulmonary artery suggesting pulmonary arterial hypertension. 4. Right renal  mass as well as splenic lesions are unchanged from recent abdominal CT. Prominent left axillary nodes, largest measuring 17 mm short axis. This is nonspecific and may be reactive, however recommend up-to-date mammography when patient is able. Aortic Atherosclerosis (ICD10-I70.0). Electronically Signed   By: Keith Rake M.D.   On: 10/23/2018 23:08   Dg Chest Port 1 View  Result Date: 10/25/2018 CLINICAL DATA:  COVID-19 positive. Shortness of breath. Chest pain. EXAM: PORTABLE CHEST 1 VIEW COMPARISON:  CT 10/23/2018.  Chest x-ray 10/23/2018. FINDINGS: Dialysis catheter in stable position with tip over right atrium. Stable severe cardiomegaly. Interim slight improvement of bilateral interstitial prominence. Basilar subsegmental atelectasis with  improved aeration from prior exam. No pleural effusion or pneumothorax. No acute bony abnormality. Mild thoracic spine scoliosis. IMPRESSION: 1.  Stable severe cardiomegaly. 2. Bilateral interstitial prominence. Interstitial prominence has improved from prior exam. This suggest improving interstitial edema and/or pneumonitis in this with known COVID-19 infection. 2. Mild bibasilar subsegmental atelectasis. Improved aeration lung bases from prior exam. Electronically Signed   By: Marcello Moores  Register   On: 10/25/2018 09:29    Medications: Infusions: . ferric gluconate (FERRLECIT/NULECIT) IV 125 mg (10/24/18 1500)    Scheduled Medications: . atorvastatin  80 mg Oral Daily  . calcitRIOL  1.5 mcg Oral Q T,Th,Sa-HD  . Chlorhexidine Gluconate Cloth  6 each Topical Q0600  . darbepoetin (ARANESP) injection - DIALYSIS  200 mcg Intravenous Q Tue-HD  . feeding supplement (PRO-STAT SUGAR FREE 64)  30 mL Oral BID  . ferric citrate  630 mg Oral TID WC  . heparin injection (subcutaneous)  5,000 Units Subcutaneous Q8H  . insulin aspart  3 Units Subcutaneous TID WC  . insulin detemir  12 Units Subcutaneous BID  . metoprolol succinate  25 mg Oral Daily  . multivitamin  1 tablet Oral QHS  . pantoprazole  40 mg Oral Daily    have reviewed scheduled and prn medications.   Dron Prasad Bhandari 10/25/2018,10:17 AM  LOS: 2 days  Pager: [phone]

## 2018-10-25 NOTE — Discharge Summary (Signed)
Physician Discharge Summary  [name] VVO:[id number] DOB: May 04, 1965  PCP: Sandi Mariscal, MD  Admitted from: Home Discharged to: Home  Admit date: 10/23/2018 Discharge date: 10/25/2018  Recommendations for Outpatient Follow-up:   Follow-up Information    Sandi Mariscal, MD. Schedule an appointment as soon as possible for a visit in 1 week(s).   Specialty: Internal Medicine Why: Please follow-up indeterminate splenic lesions and right renal mass noted on recent CT abdomen and CTA chest as outpatient and evaluation as deemed necessary. Contact information: [place] [address] [phone]        Minus Breeding, MD .   Specialty: Cardiology Contact information: [address] [place] [address] [phone]        [facility]) Follow up on 10/26/2018.   Why: Continue your regular dialysis appointments on Tuesdays, Thursdays and Saturdays.         Patient reports that she follows with orthopedics (Dr. Erlinda Hong), vascular surgery and a hand surgeon for her left thumb dry gangrene and is advised to continue same.  Home Health: None Equipment/Devices: None  Discharge Condition: Improved and stable CODE STATUS: Full Diet recommendation: Heart healthy & diabetic diet.  Discharge Diagnoses:  Principal Problem:   Acute respiratory distress Active Problems:   Hypertension   Type II diabetes mellitus with complication, uncontrolled (HCC)   Hyperlipidemia   ESRD on dialysis (HCC)   Anemia   ESRD (end stage renal disease) ([place])   COVID-19 virus detected   Obesity   Coronary artery disease   Tobacco abuse   Volume overload   Brief Summary: 53 year old female with PMH of ESRD on TTS HD, uncontrolled type II DM/IDDM, HTN, anemia of chronic disease/CKD, nonobstructive CAD, HLD, presented to ED due to progressive dyspnea that started early morning on night of admission.  She denied orthopnea, PND, chest pain, palpitation or  leg swelling.  She was recently admitted to the hospital on 9/7 due to acute metabolic encephalopathy and COVID-19 infection.  She claims compliance with her dialysis appointments.  She has history of gangrene of left thumb and is being followed by multiple specialists as indicated above.  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  She has a temporary HD catheter on the right side of her upper chest.  She lives with her husband and son, uses cane for ambulation and continues to smoke half a pack of cigarettes per day.  In the ED she was noted to have elevated blood pressures, elevated BNP, d-dimer and troponin level.  Chest x-ray showed cardiomegaly with increasing interstitial edema and bilateral pleural effusions consistent with progressive CHF.  Bibasilar airspace disease likely reflecting atelectasis.  She was given a dose of IV ceftriaxone and azithromycin due to concern for pneumonia.  She was started on nitroglycerin drip.  Nephrology was consulted for further evaluation and management.  Her COVID 19 results came back positive.  Assessment and plan: 1. Acute hypoxic respiratory failure: Likely due to fluid overload from acute diastolic CHF and ESRD on HD versus flash pulmonary edema due to hypertensive emergency.  Provided oxygen support.  Treated with IV nitroglycerin drip in the ED.  EKG mildly elevated, suspect demand ischemia related to acute hypoxia, hypertensive emergency and renal disease.  EKG without acute changes.  No chest pain reported.  proBNP 2159.  There was some concern for pulmonary embolism and hence CTA chest was obtained which was negative for pulmonary embolism but showed patchy groundglass opacities  in the right middle lobe and both lower lobes.  Nephrology was consulted and she underwent urgent HD on admission followed by regular HD yesterday.  Her dyspnea and hypoxia have resolved.  As per RN she was saturating in the mid 90s on room air with activity.  Her  chest x-ray and CT findings were likely related to pulmonary edema rather than COVID-19 pneumonia.  Can consider follow-up chest x-ray in 4 weeks to ensure resolution of abnormal findings.  TTE shows normal LVEF, detail results are noted below.  TSH normal. 2. ESRD on TTS HD: Nephrology follow-up appreciated.  I discussed with Dr. Carolin Sicks, okay to discharge home to resume prior outpatient HD.  She has right IJ [facility] for access. 3. Anemia: Likely multifactorial due to chronic disease and chronic kidney disease.  ESA as outpatient per nephrology.  Received IV iron during dialysis.  Follow CBC and renal panel periodically at HD. 4. Secondary hyperparathyroidism: Management per nephrology.  Continue calcitriol and Auryxia. 5. Hypertensive emergency/essential hypertension: Initial management in ED as noted above.  Improved after volume removal across HD and medications.  Patient reportedly taking Toprol XL 100 mg daily and was only getting 25 mg daily here and BP still remains uncontrolled mildly and hence resume prior home dose of Toprol-XL which can be further adjusted during outpatient follow-up at HD unit. 6. Uncontrolled type II DM/IDDM with renal complications: V4Q 8.7 on 09/19/2018.  Uncontrolled here as noted below.  Hesitant to increase Levemir dose this morning because she will be resuming her prior home dose of 75/25 insulin upon discharge home later this evening.  Provided SSI and mealtime NovoLog.  Close outpatient follow-up and adjust insulins as needed. 7. Left thumb dry gangrene: Seen by [facility] RN and entire thumb was dark and black without drainage.  This has been dressed by [facility] RN and patient reports that she will follow-up with specialists as outpatient as noted above. 8. Moderate to severe mitral valve regurgitation: Noted on echo.  Volume management across dialysis.  Outpatient follow-up with cardiology. 9. Indeterminate splenic lesions and indeterminate right renal mass noted on CT abdomen 9/11  and CTA chest 9/21: Needs close outpatient evaluation and management as deemed necessary. D/W Nephrology and reportedly being followed up as outpatient and probably has urology follow-up. 10. Prominent left axillary node, largest measuring 17 mm in short axis on CTA chest 9/21: Reported as nonspecific, may be reactive however recommend up to date mammography when patient able.  Consultations:  Nephrology  Procedures:  HD  TTE 10/23/2018: IMPRESSIONS    1. Left ventricular ejection fraction, by visual estimation, is 55 to 60%. The left ventricle has normal function. Normal left ventricular size. There is moderately increased left ventricular hypertrophy.  2. Global right ventricle has normal systolic function.The right ventricular size is mildly enlarged. No increase in right ventricular wall thickness.  3. Left atrial size was moderately dilated.  4. Right atrial size was mildly dilated.  5. Mild aortic valve annular calcification.  6. The mitral valve is normal in structure. Moderate to severe mitral valve regurgitation. No evidence of mitral stenosis.  7. The tricuspid valve is normal in structure. Tricuspid valve regurgitation was not visualized by color flow Doppler.  8. The aortic valve is normal in structure. Aortic valve regurgitation was not visualized by color flow Doppler. Mild to moderate aortic valve sclerosis/calcification without any evidence of aortic stenosis.  9. The pulmonic valve was normal in structure. Pulmonic valve regurgitation is mild by color flow  Doppler. 10. Moderately elevated pulmonary artery systolic pressure. 11. The inferior vena cava is dilated in size with <50% respiratory variability, suggesting right atrial pressure of 15 mmHg.    Discharge Instructions  Discharge Instructions    (HEART FAILURE PATIENTS) Call MD:  Anytime you have any of the following symptoms: 1) 3 pound weight gain in 24 hours or 5 pounds in 1 week 2) shortness of breath, with  or without a dry hacking cough 3) swelling in the hands, feet or stomach 4) if you have to sleep on extra pillows at night in order to breathe.   Complete by: As directed    Call MD for:  difficulty breathing, headache or visual disturbances   Complete by: As directed    Call MD for:  extreme fatigue   Complete by: As directed    Call MD for:  persistant dizziness or light-headedness   Complete by: As directed    Call MD for:  persistant nausea and vomiting   Complete by: As directed    Call MD for:  severe uncontrolled pain   Complete by: As directed    Call MD for:  temperature >100.4   Complete by: As directed    Diet - low sodium heart healthy   Complete by: As directed    Diet Carb Modified   Complete by: As directed    Increase activity slowly   Complete by: As directed        Medication List    TAKE these medications   ascorbic acid 500 MG tablet Commonly known as: VITAMIN C Take 1 tablet (500 mg total) by mouth at bedtime.   atorvastatin 80 MG tablet Commonly known as: LIPITOR Take 1 tablet (80 mg total) by mouth daily at 6 PM.   Auryxia 1 GM 210 MG(Fe) tablet Generic drug: ferric citrate Take 630 mg by mouth 3 (three) times daily with meals.   b complex-vitamin c-folic acid 0.8 MG Tabs tablet Take 1 tablet by mouth Every Tuesday,Thursday,and Saturday with dialysis.   benzonatate 200 MG capsule Commonly known as: TESSALON Take 1 capsule (200 mg total) by mouth 3 (three) times daily as needed for cough.   blood glucose meter kit and supplies Kit Dispense based on patient and insurance preference. Use up to four times daily as directed. (FOR ICD-9 250.00, 250.01).   calcitRIOL 0.5 MCG capsule Commonly known as: ROCALTROL Take 3 capsules (1.5 mcg total) by mouth Every Tuesday,Thursday,and Saturday with dialysis. Start taking on: October 26, 2018   diphenoxylate-atropine 2.5-0.025 MG tablet Commonly known as: Lomotil Take 1 tablet by mouth 4 (four) times  daily as needed for diarrhea or loose stools.   HumaLOG Mix 75/25 KwikPen (75-25) 100 UNIT/ML Kwikpen Generic drug: Insulin Lispro Prot & Lispro Inject 25-30 Units into the skin 2 (two) times daily with a meal.   hydrOXYzine 25 MG tablet Commonly known as: ATARAX/VISTARIL Take 25 mg by mouth daily.   metoprolol succinate 25 MG 24 hr tablet Commonly known as: TOPROL-XL Take 4 tablets (100 mg total) by mouth daily.   oxyCODONE 15 MG immediate release tablet Commonly known as: ROXICODONE Take 1 tablet (15 mg total) by mouth daily as needed for pain.   pantoprazole 40 MG tablet Commonly known as: PROTONIX Take 1 tablet (40 mg total) by mouth daily.   pramipexole 0.5 MG tablet Commonly known as: MIRAPEX Take 0.5 mg by mouth daily.   zinc sulfate 220 (50 Zn) MG capsule Take 1 capsule (220 mg  total) by mouth daily.      No Known Allergies    Procedures/Studies:  Ct Head Wo Contrast  Result Date: 10/23/2018 CLINICAL DATA:  Altered level of consciousness. Severe shortness of breath beginning at 3 a.m. today. Positive for COVID-19 EXAM: CT HEAD WITHOUT CONTRAST TECHNIQUE: Contiguous axial images were obtained from the base of the skull through the vertex without intravenous contrast. COMPARISON:  CT head without contrast 09/18/2018 FINDINGS: Brain: Mild generalized atrophy and white matter disease is stable. No acute infarct, hemorrhage, or mass lesion is present. The ventricles are of normal size. No significant extraaxial fluid collection is present. The brainstem and cerebellum are within normal limits. Vascular: Atherosclerotic changes are noted within the cavernous internal carotid arteries bilaterally. There is no hyperdense vessel. Skull: Calvarium is intact. No focal lytic or blastic lesions are present. Sinuses/Orbits: The paranasal sinuses and mastoid air cells are clear. Bilateral lens replacements are noted. Globes and orbits are otherwise unremarkable. IMPRESSION: 1. Stable  appearance of mild generalized atrophy and white matter disease. This likely reflects the sequela of chronic microvascular ischemia. 2. No acute intracranial abnormality. 3. Atherosclerosis. Electronically Signed   By: San Morelle M.D.   On: 10/23/2018 14:41   Ct Angio Chest Pe W Or Wo Contrast  Result Date: 10/23/2018 CLINICAL DATA:  Pulmonary embolism (PE), suspected. COVID-19 positive. Worsening shortness of breath. EXAM: CT ANGIOGRAPHY CHEST WITH CONTRAST TECHNIQUE: Multidetector CT imaging of the chest was performed using the standard protocol during bolus administration of intravenous contrast. Multiplanar CT image reconstructions and MIPs were obtained to evaluate the vascular anatomy. CONTRAST:  75m OMNIPAQUE IOHEXOL 350 MG/ML SOLN COMPARISON:  Chest radiograph earlier this day. Lung bases from abdominal CT 10/13/2018 FINDINGS: Cardiovascular: There are no filling defects within the pulmonary arteries to suggest pulmonary embolus. There is dilatation of the main pulmonary artery of 4.4 cm. Thoracic aorta is normal in caliber with moderate atherosclerosis. Multi chamber cardiomegaly. There are coronary artery calcifications. No pericardial effusion. Contrast refluxes into the hepatic veins and IVC. Right internal jugular dialysis catheter tip in the right atrium. 1 Mediastinum/Nodes: Prominent right hilar node measuring 12 mm. There is small mediastinal nodes. No visualized thyroid nodule. Enlarged left axillary node measuring 17 mm, series 5, image 13. Additional prominent nodes in the left axilla, partially included. Decompressed esophagus. Lungs/Pleura: There are patchy ground-glass opacities within the right middle lobe and both lower lobes, more prominent than on prior abdominal CT in the lung bases. Linear atelectasis in the left upper lobe. No definite septal thickening. No confluent airspace disease. Trace atypical emphysema. The trachea and mainstem bronchi are patent. Upper Abdomen:  Multiple splenic lesions which were better defined on prior abdominal CT secondary to phase of contrast imaging. Heterogeneous lesion in the upper right kidney as seen on prior CT. Bilateral renal parenchymal atrophy. Musculoskeletal: There are no acute or suspicious osseous abnormalities. Degenerative change in the spine. Review of the MIP images confirms the above findings. IMPRESSION: 1. No pulmonary embolus. 2. Patchy ground-glass opacities in the right middle lobe and both lower lobes, more prominent than on prior abdominal CT in the lung bases. Pulmonary edema or atypical viral infection in this patient with known COVID-19. 3. Cardiomegaly with reflux of contrast into the hepatic veins and IVC consistent with elevated right heart pressures. Dilated main pulmonary artery suggesting pulmonary arterial hypertension. 4. Right renal mass as well as splenic lesions are unchanged from recent abdominal CT. Prominent left axillary nodes, largest measuring 17 mm short axis.  This is nonspecific and may be reactive, however recommend up-to-date mammography when patient is able. Aortic Atherosclerosis (ICD10-I70.0). Electronically Signed   By: Keith Rake M.D.   On: 10/23/2018 23:08   Ct Abdomen Pelvis W Contrast  Result Date: 10/13/2018 CLINICAL DATA:  53 year old female with unexplained anemia. COVID-19 positive. Patient with end-stage renal disease. EXAM: CT ABDOMEN AND PELVIS WITH CONTRAST TECHNIQUE: Multidetector CT imaging of the abdomen and pelvis was performed using the standard protocol following bolus administration of intravenous contrast. CONTRAST:  148m OMNIPAQUE IOHEXOL 300 MG/ML  SOLN COMPARISON:  07/19/2011 FINDINGS: Lower chest: Mild bibasilar ground-glass opacities noted. Cardiomegaly is present. Hepatobiliary: The liver and gallbladder are unremarkable. No biliary dilatation. Pancreas: Unremarkable Spleen: 3 indeterminate hypodense lesions within the spleen are noted, 2 measuring 5 cm and the  largest anteriorly measuring 6.5 cm. These lesions measure in the 40s of Hounsfield units and are new since 07/19/2011. Adrenals/Urinary Tract: Bilateral renal atrophy identified. A 2.5 cm mass extending off of the posterior mid-UPPER RIGHT kidney (series 3: Image 36) measures 36 Hounsfield units. Other very small indeterminate bilateral renal lesions are present. No evidence of hydronephrosis. Heavy vascular calcifications are identified. The adrenal glands and bladder are unremarkable. Stomach/Bowel: Stomach is within normal limits. Appendix appears normal. No evidence of bowel wall thickening, distention, or inflammatory changes. Vascular/Lymphatic: UPPER limits normal retroperitoneal and pelvic sidewall lymph nodes are unchanged from 2013. Aortic atherosclerotic calcifications noted without aortic aneurysm. No new or suspicious lymph nodes are identified. Reproductive: Multiple fibroids are again noted including a 6.7 cm calcified fibroid. A lipoma is now identified within the uterus. Other: A moderate low pelvic midline ventral hernia containing bowel loops is again noted without evidence of bowel obstruction. No ascites, pneumoperitoneum or focal collection. Musculoskeletal: No acute or suspicious bony abnormalities. Mild-to-moderate multilevel degenerative disc disease in the lumbar spine noted. IMPRESSION: 1. Three indeterminate splenic lesions, 2 measuring 5 cm and 1 measuring 6.5 cm. Differential includes multiple benign etiologies but lymphoma or metastasis are not excluded. 2. Indeterminate 2.5 cm RIGHT renal mass, but suspicious for solid mass/renal cell carcinoma. 3. Mild bilateral ground-glass opacities which may represent infection in this patient with known COVID-19. 4. Stable low pelvic midline ventral hernia containing bowel loops. No evidence of bowel obstruction. 5. Cardiomegaly and Aortic Atherosclerosis (ICD10-I70.0). Electronically Signed   By: JMargarette CanadaM.D.   On: 10/13/2018 20:30   Dg  Chest Port 1 View  Result Date: 10/25/2018 CLINICAL DATA:  COVID-19 positive. Shortness of breath. Chest pain. EXAM: PORTABLE CHEST 1 VIEW COMPARISON:  CT 10/23/2018.  Chest x-ray 10/23/2018. FINDINGS: Dialysis catheter in stable position with tip over right atrium. Stable severe cardiomegaly. Interim slight improvement of bilateral interstitial prominence. Basilar subsegmental atelectasis with improved aeration from prior exam. No pleural effusion or pneumothorax. No acute bony abnormality. Mild thoracic spine scoliosis. IMPRESSION: 1.  Stable severe cardiomegaly. 2. Bilateral interstitial prominence. Interstitial prominence has improved from prior exam. This suggest improving interstitial edema and/or pneumonitis in this with known COVID-19 infection. 2. Mild bibasilar subsegmental atelectasis. Improved aeration lung bases from prior exam. Electronically Signed   By: TSaxon  On: 10/25/2018 09:29   Dg Chest Port 1 View  Result Date: 10/23/2018 CLINICAL DATA:  Shortness of breath. EXAM: PORTABLE CHEST 1 VIEW COMPARISON:  ONE-VIEW CHEST X-RAY 10/11/2018 FINDINGS: The heart is enlarged. A right IJ dialysis catheter is stable. Interstitial edema and bilateral effusions have increased. Bibasilar airspace disease has progressed, left greater than right. Lung volumes  remain low. IMPRESSION: 1. Cardiomegaly with increasing interstitial edema and bilateral pleural effusions consistent with progressive congestive heart failure. 2. Bibasilar airspace disease likely reflects atelectasis. Infection is not excluded. 3. Dialysis catheter is stable. Electronically Signed   By: San Morelle M.D.   On: 10/23/2018 10:20     Subjective: Patient denies complaints.  No dyspnea, chest pain, palpitations, dizziness or lightheadedness.  Ambulated with RN who reported oxygen saturations in the mid 90s on room air.  Discharge Exam:  Vitals:   10/24/18 1600 10/24/18 2338 10/25/18 0521 10/25/18 0858  BP:  133/61 (!) 160/99  (!) 176/85  Pulse: 81 83  75  Resp: 17 16  20   Temp: 98 F (36.7 C) 98.2 F (36.8 C)  98.2 F (36.8 C)  TempSrc: Oral Oral  Oral  SpO2: 98% 96%  98%  Weight: 106 kg  107.3 kg   Height:        General: Pleasant young female, moderately built and obese sitting up comfortably in bed without distress. Cardiovascular: S1 & S2 heard, RRR, S1/S2 +. No rubs, gallops or clicks.  No JVD.  Trace bilateral ankle edema.  Grade 2/6 systolic murmur best heard at apex.  Not on telemetry. Respiratory: Clear to auscultation without wheezing, rhonchi or crackles. No increased work of breathing.  Has right IJ HD catheter. Abdominal:  Non distended, non tender & soft. No organomegaly or masses appreciated. Normal bowel sounds heard. CNS: Alert and oriented. No focal deficits. Extremities: no edema, no cyanosis.  Left thumb/hand dressing clean and dry.  Left upper arm nonfluctuating AV fistula and no thrill appreciated.    The results of significant diagnostics from this hospitalization (including imaging, microbiology, ancillary and laboratory) are listed below for reference.     Microbiology: Recent Results (from the past 240 hour(s))  Culture, blood (Routine X 2) w Reflex to ID Panel     Status: None (Preliminary result)   Collection Time: 10/23/18 10:35 AM   Specimen: BLOOD RIGHT ARM  Result Value Ref Range Status   Specimen Description BLOOD RIGHT ARM  Final   Special Requests   Final    BOTTLES DRAWN AEROBIC AND ANAEROBIC Blood Culture adequate volume   Culture   Final    NO GROWTH 2 DAYS Performed at [facility], [address], [place], [place] [address]    Report Status PENDING  Incomplete  SARS Coronavirus 2 [facility] order, Performed in [facility] hospital lab) Nasopharyngeal Nasopharyngeal Swab     Status: Abnormal   Collection Time: 10/23/18 10:35 AM   Specimen: Nasopharyngeal Swab  Result Value Ref Range Status   SARS Coronavirus 2 POSITIVE (A) NEGATIVE  Final    Comment: RESULT CALLED TO, READ BACK BY AND VERIFIED WITH: Johny Sax RN 12:30 10/23/18 ([email or web address]) (NOTE) If result is NEGATIVE SARS-CoV-2 target nucleic acids are NOT DETECTED. The SARS-CoV-2 RNA is generally detectable in upper and lower  respiratory specimens during the acute phase of infection. The lowest  concentration of SARS-CoV-2 viral copies this assay can detect is 250  copies / mL. A negative result does not preclude SARS-CoV-2 infection  and should not be used as the sole basis for treatment or other  patient management decisions.  A negative result may occur with  improper specimen collection / handling, submission of specimen other  than nasopharyngeal swab, presence of viral mutation(s) within the  areas targeted by this assay, and inadequate number of viral copies  (<250 copies / mL). A negative  result must be combined with clinical  observations, patient history, and epidemiological information. If result is POSITIVE SARS-CoV-2 target nucleic acids are DETECTED. T he SARS-CoV-2 RNA is generally detectable in upper and lower  respiratory specimens during the acute phase of infection.  Positive  results are indicative of active infection with SARS-CoV-2.  Clinical  correlation with patient history and other diagnostic information is  necessary to determine patient infection status.  Positive results do  not rule out bacterial infection or co-infection with other viruses. If result is PRESUMPTIVE POSTIVE SARS-CoV-2 nucleic acids MAY BE PRESENT.   A presumptive positive result was obtained on the submitted specimen  and confirmed on repeat testing.  While 2019 novel coronavirus  (SARS-CoV-2) nucleic acids may be present in the submitted sample  additional confirmatory testing may be necessary for epidemiological  and / or clinical management purposes  to differentiate between  SARS-CoV-2 and other Sarbecovirus currently known to infect humans.  If clinically  indicated additional testing with an alternate test  methodology [phone]) is  advised. The SARS-CoV-2 RNA is generally  detectable in upper and lower respiratory specimens during the acute  phase of infection. The expected result is Negative. Fact Sheet for Patients:  StrictlyIdeas.no Fact Sheet for Healthcare Providers: BankingDealers.co.za This test is not yet approved or cleared by the [place] FDA and has been authorized for detection and/or diagnosis of SARS-CoV-2 by FDA under an Emergency Use Authorization (EUA).  This EUA will remain in effect (meaning this test can be used) for the duration of the COVID-19 declaration under Section 564(b)(1) of the Act, 21 U.S.C. section 360bbb-3(b)(1), unless the authorization is terminated or revoked sooner. Performed at [facility] [facility], [place] [address], [place], [place] [address]   Culture, blood (routine x 2)     Status: None (Preliminary result)   Collection Time: 10/23/18  3:15 PM   Specimen: BLOOD  Result Value Ref Range Status   Specimen Description BLOOD RIGHT ANTECUBITAL  Final   Special Requests   Final    BOTTLES DRAWN AEROBIC AND ANAEROBIC Blood Culture adequate volume   Culture   Final    NO GROWTH 2 DAYS Performed at [facility], [place] [address] [place], [place] [address]    Report Status PENDING  Incomplete     Labs: CBC: Recent Labs  Lab 10/23/18 0950 10/23/18 1043 10/24/18 0527 10/25/18 0738  WBC 8.4  --  9.3 8.8  NEUTROABS 6.4  --   --  6.5  HGB 8.4* 8.8*  9.2* 8.7* 8.6*  HCT 26.6* 26.0*  27.0* 28.0* 27.8*  MCV 100.8*  --  99.3 98.2  PLT 193  --  207 001   Basic Metabolic Panel: Recent Labs  Lab 10/23/18 0950 10/23/18 1043 10/23/18 1150 10/24/18 0527 10/25/18 0738  NA 137 136  137  --  134* 131*  K 4.4 4.3  4.3  --  4.9 4.3  CL 95* 96*  --  96* 90*  CO2 27  --   --  22 24  GLUCOSE 378* 370*  --  271* 408*  BUN 36* 35*  --  23* 37*   CREATININE 6.50* 6.30*  --  4.72* 5.24*  CALCIUM 9.2  --   --  8.8* 9.3  MG  --   --  2.1  --   --   PHOS  --   --  3.8  --   --    Liver Function Tests: No results for  input(s): AST, ALT, ALKPHOS, BILITOT, PROT, ALBUMIN in the last 168 hours. BNP (last 3 results) Recent Labs    09/18/18 1227 10/23/18 0951  BNP 357.4* 2,159.1*   Cardiac Enzymes: No results for input(s): CKTOTAL, CKMB, CKMBINDEX, TROPONINI in the last 168 hours. CBG: Recent Labs  Lab 10/24/18 0724 10/24/18 1627 10/24/18 2036 10/25/18 0853 10/25/18 1206  GLUCAP 306* 318* 427* 375* 429*   Hgb A1c No results for input(s): HGBA1C in the last 72 hours. Lipid Profile No results for input(s): CHOL, HDL, LDLCALC, TRIG, CHOLHDL, LDLDIRECT in the last 72 hours. Thyroid function studies Recent Labs    10/23/18 1253  TSH 2.957   Anemia work up No results for input(s): VITAMINB12, FOLATE, FERRITIN, TIBC, IRON, RETICCTPCT in the last 72 hours.    Time coordinating discharge: 40 minutes  SIGNED:  Vernell Leep, MD, FACP, [facility]. [facility]  To contact the attending provider between 7A-7P or the covering provider during after hours 7P-7A, please log into the web site [email or web address] and access using universal [place] password for that web site. If you do not have the password, please call the hospital operator.

## 2018-10-25 NOTE — Consult Note (Signed)
[facility] Nurse wound consult note Patient receiving care in [place]. Patient is COVID+ Reason for Consult: Left thumb Wound type: dry gangrene as indicated by the primary RN, Megan from night shift. Measurement: Entire thumb is dry and black Drainage (amount, consistency, odor) none Periwound: intact, fissure between thumb and second finger. Dressing procedure/placement/frequency: Apply dry gauze and wrap in [place] daily.  This is the dressing the patient told Jinny Blossom she was using at home. Monitor the wound area(s) for worsening of condition such as: Signs/symptoms of infection,  Increase in size,  Development of or worsening of odor, Development of pain, or increased pain at the affected locations.  Notify the medical team if any of these develop.  Thank you for the consult.  Discussed plan of care with the bedside nurse.  [facility] nurse will not follow at this time.  Please re-consult the [facility] team if needed.  Val Riles, RN, MSN, CWOCN, CNS-BC, pager [phone]

## 2018-10-25 NOTE — Discharge Instructions (Signed)

## 2018-10-25 NOTE — Progress Notes (Signed)
Inpatient Diabetes Program Recommendations  AACE/ADA: New Consensus Statement on Inpatient Glycemic Control   Target Ranges:  Prepandial:   less than 140 mg/dL      Peak postprandial:   less than 180 mg/dL (1-2 hours)      Critically ill patients:  140 - 180 mg/dL   Results for [name], [name] (MRN [id number]) as of 10/25/2018 11:00  Ref. Range 10/24/2018 07:24 10/24/2018 16:27 10/24/2018 20:36 10/25/2018 08:53  Glucose-Capillary Latest Ref Range: 70 - 99 mg/dL 306 (H)  Novolog 3 units  Levemir 12 units  Decadron 6 mg 318 (H)  Novolog 3 units 427 (H)     Levemir 12 units 375 (H)  Novolog 3 units  Levemir 12 units   Review of Glycemic Control  Diabetes history: DM2 Outpatient Diabetes medications: Humalog 75/25 25-30 units BID Current orders for Inpatient glycemic control: Levemir 12 units BID, Novolog 3 units TID with meals  Inpatient Diabetes Program Recommendations:   Insulin-Basal: Please consider increasing Levemir to 16 units BID.  Insulin-Correction: Please consider ordering Novolog 0-9 units TID with meals and Novolog 0-5 units QHS for bedtime correction (in addition to Novolog meal coverage already ordered).  Thanks, Barnie Alderman, RN, MSN, CDE Diabetes Coordinator Inpatient Diabetes Program [phone] (Team Pager from 8am to 5pm)

## 2018-10-25 NOTE — Progress Notes (Signed)
Pt lunch time CBG 429 notified Dr Algis Liming instructed to give max on SSI plus her 3 units. Pt is being discharged today.

## 2018-10-25 NOTE — Progress Notes (Signed)
SATURATION QUALIFICATIONS: (This note is used to comply with regulatory documentation for home oxygen)  Patient Saturations on Room Air at Rest = 96%  Patient Saturations on Room Air while Ambulating = 97%  Patient Saturations on  Liters of oxygen while Ambulating = %  Please briefly explain why patient needs home oxygen:

## 2018-10-25 NOTE — Progress Notes (Signed)
Patient will need to return to [facility] + isolation shift TTS 12:00pm (arrive at 11:45am) for OP HD treatment at discharge. Patient has already been treating at this clinic/shift prior to this admission.   Alphonzo Cruise, [place] Renal Navigator [phone]

## 2018-10-25 NOTE — TOC Transition Note (Signed)
Transition of Care [facility]) - CM/SW Discharge Note   Patient Details  Name: [name] MRN: [id number] Date of Birth: Apr 07, 1965  [facility] CM/SW Contact:  Benard Halsted, LCSW Phone Number: 10/25/2018, 3:58 PM   Clinical Narrative:    [facility] called to take patient home, per patient request.    Final next level of care: Home/Self Care Barriers to Discharge: No Barriers Identified   Patient Goals and CMS Choice   CMS Medicare.gov Compare Post Acute Care list provided to:: Patient Choice offered to / list presented to : Patient  Discharge Placement                       Discharge Plan and Services In-house Referral: NA Discharge Planning Services: CM Consult Post Acute Care Choice: NA                               Social Determinants of Health (SDOH) Interventions     Readmission Risk Interventions No flowsheet data found.

## 2018-10-27 ENCOUNTER — Ambulatory Visit (HOSPITAL_COMMUNITY): Admission: RE | Admit: 2018-10-27 | Payer: Medicare Other | Source: Ambulatory Visit

## 2018-10-28 LAB — CULTURE, BLOOD (ROUTINE X 2)
Culture: NO GROWTH
Culture: NO GROWTH
Special Requests: ADEQUATE
Special Requests: ADEQUATE

## 2018-11-02 ENCOUNTER — Other Ambulatory Visit: Payer: Self-pay

## 2018-11-02 DIAGNOSIS — N186 End stage renal disease: Secondary | ICD-10-CM

## 2018-11-02 DIAGNOSIS — Z992 Dependence on renal dialysis: Secondary | ICD-10-CM

## 2018-11-02 NOTE — Progress Notes (Deleted)
Cardiology Office Note   Date:  11/02/2018   ID:  [name], DOB [birth year], MRN [id number]  PCP:  Sandi Mariscal, MD  Cardiologist:   Minus Breeding, MD Referring:  ***  No chief complaint on file.     History of Present Illness: [name] is a 53 y.o. female who presents for follow up for  LVH.  She has a history of pericardial effusion with a window in 2006.  A search of old records indicate that this was at the time of a diagnosis of pneumonia.   She also has had severe LVH on echo with the last of these in our system in 2019.  He was to have a cardiac MRI.       He was in the hospital a couple of weeks ago for acute respiratory failure with acute on chronic diastolic HF.  He had hypertensive urgency.  He was treated with IV NTG.  CTA was negative for PE.    She did have an echo with a normal EF.  MR was moderate to severe.  ***      Left ventricular ejection fraction, by visual estimation, is 55 to 60%. The left ventricle has normal function. Normal left ventricular size. There is moderately increased left ventricular hypertrophy. 2. Global right ventricle has normal systolic function.The right ventricular size is mildly enlarged. No increase in right ventricular wall thickness. 3. Left atrial size was moderately dilated. 4. Right atrial size was mildly dilated. 5. Mild aortic valve annular calcification. 6. The mitral valve is normal in structure. Moderate to severe mitral valve regurgitation. No evidence of mitral stenosis. 7. The tricuspid valve is normal in structure. Tricuspid valve regurgitation was not visualized by color flow Doppler. 8. The aortic valve is normal in structure. Aortic valve regurgitation was not visualized by color flow Doppler. Mild to moderate aortic valve sclerosis/calcification without any evidence of aortic stenosis. 9. The pulmonic valve was normal in structure. Pulmonic valve regurgitation is mild by color flow Doppler. 10.  Moderately elevated pulmonary artery systolic pressure. 11. 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  She has had some numbness and tingling in her left arm.  She says she gets a squeezing sensation.  Been happening for several months.  It is sporadic.  It is the same arm that she has a fistula.  She says it is not as severe as it was.  Its mild to moderate and less frequent.  She is not describing chest pressure, neck or arm discomfort.  She is not describing associated nausea vomiting or diaphoresis.  She does not have shortness of breath, PND or orthopnea.  She tolerates dialysis well.  She is limited by back pain.  She walks only in her house.   Past Medical History:  Diagnosis Date  . Anemia   . Arthritis    "knees"  . CAD (coronary artery disease)    Nonobstructive on CT 2019  . ESRD (end stage renal disease) ([place])    Dialysis M/W/F  . Headache(784.0)   . High cholesterol   . Hypertension   . 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 Pericardial effusion   . Pneumonia    x 2   . PVD (peripheral vascular disease) ([place])    Right leg stent in [place].  (No records)  . Restless legs   . Sleep apnea  does not use Cpap  . Type II diabetes mellitus ([place])     Past Surgical History:  Procedure Laterality Date  . AV FISTULA PLACEMENT Left   . [place]; 1997   x 2  . COLONOSCOPY    . EYE SURGERY Bilateral    cataract surgery  . INSERTION OF DIALYSIS CATHETER Right 09/26/2018   Procedure: INSERTION OF DIALYSIS CATHETER Right Internal Jugular.;  Surgeon: Elam Dutch, MD;  Location: [facility];  Service: Vascular;  Laterality: Right;  . LIGATION OF ARTERIOVENOUS  FISTULA Left 09/26/2018   Procedure: LIGATION OF ARTERIOVENOUS  FISTULA LEFT ARM;  Surgeon: Elam Dutch, MD;  Location: [facility];  Service: Vascular;  Laterality: Left;  . PERICARDIAL WINDOW  02/2004   for pericardial effusion   . TUBAL LIGATION  05/1995     Current Outpatient Medications  Medication Sig Dispense Refill  . ascorbic acid (VITAMIN C) 500 MG tablet Take 1 tablet (500 mg total) by mouth at bedtime.    Marland Kitchen atorvastatin (LIPITOR) 80 MG tablet Take 1 tablet (80 mg total) by mouth daily at 6 PM.    . AURYXIA 1 GM 210 MG(Fe) tablet Take 630 mg by mouth 3 (three) times daily with meals.     Marland Kitchen b complex-vitamin c-folic acid (NEPHRO-VITE) 0.8 MG TABS tablet Take 1 tablet by mouth Every Tuesday,Thursday,and Saturday with dialysis.     Marland Kitchen benzonatate (TESSALON) 200 MG capsule Take 1 capsule (200 mg total) by mouth 3 (three) times daily as needed for cough. 20 capsule 0  . blood glucose meter kit and supplies KIT Dispense based on patient and insurance preference. Use up to four times daily as directed. (FOR ICD-9 250.00, 250.01). 1 each 0  . calcitRIOL (ROCALTROL) 0.5 MCG capsule Take 3 capsules (1.5 mcg total) by mouth Every Tuesday,Thursday,and Saturday with dialysis. 30 capsule 0  . diphenoxylate-atropine (LOMOTIL) 2.5-0.025 MG tablet Take 1 tablet by mouth 4 (four) times daily as needed for diarrhea or loose stools. 30 tablet 0  . hydrOXYzine (ATARAX/VISTARIL) 25 MG tablet Take 25 mg by mouth daily.   1  . Insulin Lispro Prot & Lispro (HUMALOG MIX 75/25 KWIKPEN) (75-25) 100 UNIT/ML Kwikpen Inject 25-30 Units into the skin 2 (two) times daily with a meal.     . metoprolol succinate (TOPROL-XL) 25 MG 24 hr tablet Take 4 tablets (100 mg total) by mouth daily.    Marland Kitchen oxyCODONE (ROXICODONE) 15 MG immediate release tablet Take 1 tablet (15 mg total) by mouth daily as needed for pain.  0  . pantoprazole (PROTONIX) 40 MG tablet Take 1 tablet (40 mg total) by mouth daily. 30 tablet 0  . pramipexole (MIRAPEX) 0.5 MG tablet Take 0.5 mg by mouth daily.   1  . zinc sulfate 220 (50 Zn) MG capsule Take 1 capsule (220 mg total) by mouth daily. 14 capsule 0   No current facility-administered medications for this visit.      Allergies:   Patient has no known allergies.    Social History:  The patient  reports that she has been smoking cigarettes. She has a 10.00 pack-year smoking history. She has never used smokeless tobacco. She reports that she does not drink alcohol or use drugs.   Family History:  The patient's ***family history includes Cancer in her brother; Dementia in her mother; Diabetes in her brother, father, mother, and sister; Heart disease in her father; Hyperlipidemia in her father; Hypertension in her brother, father, mother, and  sister; Stroke in her father and mother.    ROS:  Please see the history of present illness.   Otherwise, review of systems are positive for {NONE DEFAULTED:18576::"none"}.   All other systems are reviewed and negative.    PHYSICAL EXAM: VS:  LMP  (LMP Unknown)  , BMI There is no height or weight on file to calculate BMI. GENERAL:  Well appearing HEENT:  Pupils equal round and reactive, fundi not visualized, oral mucosa unremarkable NECK:  No jugular venous distention, waveform within normal limits, carotid upstroke brisk and symmetric, no bruits, no thyromegaly LYMPHATICS:  No cervical, inguinal adenopathy LUNGS:  Clear to auscultation bilaterally BACK:  No CVA tenderness CHEST:  Unremarkable HEART:  PMI not displaced or sustained,S1 and S2 within normal limits, no S3, no S4, no clicks, no rubs, *** murmurs ABD:  Flat, positive bowel sounds normal in frequency in pitch, no bruits, no rebound, no guarding, no midline pulsatile mass, no hepatomegaly, no splenomegaly EXT:  2 plus pulses throughout, no edema, no cyanosis no clubbing SKIN:  No rashes no nodules NEURO:  Cranial nerves II through XII grossly intact, motor grossly intact throughout PSYCH:  Cognitively intact, oriented to person place and time    EKG:  EKG {ACTION; IS/IS HWY:[id number]} ordered today. The ekg ordered today demonstrates ***   Recent Labs: 10/10/2018: ALT 11 10/23/2018: B Natriuretic Peptide  2,159.1; Magnesium 2.1; TSH 2.957 10/25/2018: BUN 37; Creatinine, Ser 5.24; Hemoglobin 8.6; Platelets 210; Potassium 4.3; Sodium 131    Lipid Panel    Component Value Date/Time   CHOL 243 (H) 07/07/2017 2107   CHOL 261 (H) 06/01/2017 0959   TRIG 145 09/20/2018 0440   HDL 30 (L) 07/07/2017 2107   HDL 41 06/01/2017 0959   CHOLHDL 8.1 07/07/2017 2107   VLDL UNABLE TO CALCULATE IF TRIGLYCERIDE OVER 400 mg/dL 07/07/2017 2107   LDLCALC UNABLE TO CALCULATE IF TRIGLYCERIDE OVER 400 mg/dL 07/07/2017 2107   LDLCALC 147 (H) 06/01/2017 0959   LDLDIRECT 208 (H) 07/23/2011 1135      Wt Readings from Last 3 Encounters:  10/25/18 236 lb 8.9 oz (107.3 kg)  10/14/18 245 lb 9.5 oz (111.4 kg)  09/26/18 238 lb 1.6 oz (108 kg)      Other studies Reviewed: Additional studies/ records that were reviewed today include: ***. Review of the above records demonstrates:  Please see elsewhere in the note.  ***   ASSESSMENT AND PLAN:  ACUTE DIASTOLIC HF:   She was to get an MRI without contrast.   ***  ESRD:  The patient is treated with HD.     CAD:  The patient had non obstructive CAD on CTA.  No futher work up is indicated.  ***   ANEMIA:  This is chronic and likely related to CKD  HTN:  ***  DM:  This is not controlled.  I will defer to Sandi Mariscal, MD   MR:  This is moderate to severe on echo.  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  ***  DYSLIPIDEMIA:  She is on high dose statin.  However, she was not at target for her LDL.  ***   Current medicines are reviewed at length with the patient today.  The patient {ACTIONS; HAS/DOES NOT HAVE:19233} concerns regarding medicines.  The following changes have been made:  {PLAN; NO CHANGE:13088:s}  Labs/ tests ordered today include: *** No orders of the defined types were placed in this encounter.    Disposition:  FU with ***    Signed, Minus Breeding, MD  11/02/2018 6:58 PM    [facility]

## 2018-11-03 ENCOUNTER — Ambulatory Visit: Payer: Medicare Other | Admitting: Cardiology

## 2018-11-08 ENCOUNTER — Other Ambulatory Visit: Payer: Self-pay

## 2018-11-08 ENCOUNTER — Ambulatory Visit (INDEPENDENT_AMBULATORY_CARE_PROVIDER_SITE_OTHER): Payer: Self-pay | Admitting: Physician Assistant

## 2018-11-08 ENCOUNTER — Ambulatory Visit (HOSPITAL_COMMUNITY)
Admission: RE | Admit: 2018-11-08 | Discharge: 2018-11-08 | Disposition: A | Discharge status: Home / Self Care | Payer: Medicare Other | Source: Ambulatory Visit | Attending: Family | Admitting: Family

## 2018-11-08 ENCOUNTER — Encounter: Payer: Self-pay | Admitting: *Deleted

## 2018-11-08 ENCOUNTER — Encounter: Payer: Self-pay | Admitting: Physician Assistant

## 2018-11-08 VITALS — BP 151/71 | HR 75 | Temp 97.5°F | Resp 20 | Ht 68.0 in | Wt 236.0 lb

## 2018-11-08 DIAGNOSIS — Z992 Dependence on renal dialysis: Secondary | ICD-10-CM | POA: Diagnosis present

## 2018-11-08 DIAGNOSIS — N186 End stage renal disease: Secondary | ICD-10-CM | POA: Insufficient documentation

## 2018-11-08 NOTE — Progress Notes (Addendum)
WLS:LHTDSKAJ [name] a 53 y.o.female,with a several month history of slowly worsening wound in her left thumb.  She is now s/p Ultrasound-guided insertion of Palidrome catheter, ligation left arm AV fistula by Dr. 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   She was scheduled for f/u with a hand surgeon in regards to the left thumb tissue loss.  She is here today for re-evaluation and new HD access.  Her dialysis today is Tuesday Thursday Saturday. Her nephrologist is Dr. Posey Pronto.  She denise further tissue loss of the left thumb or hand.  She states she has not seen a hand specialist yet and has not had a phone call about it yet.    She is here today for option of new access.   Past Medical History:  Diagnosis Date  . Anemia   . Arthritis    "knees"  . CAD (coronary artery disease)    Nonobstructive on CT 2019  . ESRD (end stage renal disease) ([place])    Dialysis M/W/F  . Headache(784.0)   . High cholesterol   . Hypertension   . 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 Pericardial effusion   . Pneumonia    x 2   . PVD (peripheral vascular disease) ([place])    Right leg stent in [place].  (No records)  . Restless legs   . Sleep apnea    does not use Cpap  . Type II diabetes mellitus ([place])     Past Surgical History:  Procedure Laterality Date  . AV FISTULA PLACEMENT Left   . [place]; 1997   x 2  . COLONOSCOPY    . EYE SURGERY Bilateral    cataract surgery  . INSERTION OF DIALYSIS CATHETER Right 09/26/2018   Procedure: INSERTION OF DIALYSIS CATHETER Right Internal Jugular.;  Surgeon: Elam Dutch, MD;  Location: [facility];  Service: Vascular;  Laterality: Right;  . LIGATION OF ARTERIOVENOUS  FISTULA Left 09/26/2018   Procedure: LIGATION OF ARTERIOVENOUS  FISTULA LEFT ARM;  Surgeon: Elam Dutch, MD;  Location: [facility];  Service: Vascular;  Laterality: Left;  . PERICARDIAL WINDOW  02/2004   for pericardial effusion  .  TUBAL LIGATION  05/1995     Social History Social History   Tobacco Use  . Smoking status: Current Every Day Smoker    Packs/day: 0.50    Years: 20.00    Pack years: 10.00    Types: Cigarettes  . Smokeless tobacco: Never Used  . Tobacco comment: Trying to quit  Substance Use Topics  . Alcohol use: No  . Drug use: No    Family History Family History  Problem Relation Age of Onset  . Cancer Brother   . Heart disease Father        Died age 29  . Diabetes Father   . Hyperlipidemia Father   . Hypertension Father   . Stroke Father   . Diabetes Mother   . Hypertension Mother   . Stroke Mother   . Dementia Mother   . Diabetes Sister   . Diabetes Brother   . Hypertension Sister   . Hypertension Brother     Allergies  No Known Allergies   Current Outpatient Medications  Medication Sig Dispense Refill  . amLODipine (NORVASC) 2.5 MG tablet TK 1 T PO QPM    . ascorbic acid (VITAMIN C) 500 MG tablet Take 1 tablet (500 mg  total) by mouth at bedtime.    . atorvastatin (LIPITOR) 80 MG tablet Take 1 tablet (80 mg total) by mouth daily at 6 PM.    . AURYXIA 1 GM 210 MG(Fe) tablet Take 630 mg by mouth 3 (three) times daily with meals.     . b complex-vitamin c-folic acid (NEPHRO-VITE) 0.8 MG TABS tablet Take 1 tablet by mouth Every Tuesday,Thursday,and Saturday with dialysis.     . benzonatate (TESSALON) 200 MG capsule Take 1 capsule (200 mg total) by mouth 3 (three) times daily as needed for cough. 20 capsule 0  . blood glucose meter kit and supplies KIT Dispense based on patient and insurance preference. Use up to four times daily as directed. (FOR ICD-9 250.00, 250.01). 1 each 0  . calcitRIOL (ROCALTROL) 0.5 MCG capsule Take 3 capsules (1.5 mcg total) by mouth Every Tuesday,Thursday,and Saturday with dialysis. 30 capsule 0  . diphenoxylate-atropine (LOMOTIL) 2.5-0.025 MG tablet Take 1 tablet by mouth 4 (four) times daily as needed for diarrhea or loose stools. 30 tablet 0  .  gabapentin (NEURONTIN) 100 MG capsule TK 1 C PO TID    . hydrOXYzine (ATARAX/VISTARIL) 25 MG tablet Take 25 mg by mouth daily.   1  . Insulin Lispro Prot & Lispro (HUMALOG MIX 75/25 KWIKPEN) (75-25) 100 UNIT/ML Kwikpen Inject 25-30 Units into the skin 2 (two) times daily with a meal.     . metoprolol succinate (TOPROL-XL) 25 MG 24 hr tablet Take 4 tablets (100 mg total) by mouth daily.    . oxyCODONE (ROXICODONE) 15 MG immediate release tablet Take 1 tablet (15 mg total) by mouth daily as needed for pain.  0  . pantoprazole (PROTONIX) 40 MG tablet Take 1 tablet (40 mg total) by mouth daily. 30 tablet 0  . pramipexole (MIRAPEX) 0.5 MG tablet Take 0.5 mg by mouth daily.   1  . zinc sulfate 220 (50 Zn) MG capsule Take 1 capsule (220 mg total) by mouth daily. 14 capsule 0   No current facility-administered medications for this visit.     ROS:   General:  No weight loss, Fever, chills  HEENT: No recent headaches, no nasal bleeding, no visual changes, no sore throat  Neurologic: No dizziness, blackouts, seizures. No recent symptoms of stroke or mini- stroke. No recent episodes of slurred speech, or temporary blindness.  Cardiac: No recent episodes of chest pain/pressure, no shortness of breath at rest.  positive shortness of breath with exertion.  positive history of atrial fibrillation or irregular heartbeat  Vascular: No history of rest pain in feet.  No history of claudication.  positve history of non-healing ulcer, No history of DVT   Pulmonary: No home oxygen, no productive cough, no hemoptysis,  No asthma or wheezing  Musculoskeletal:  [ ] Arthritis, [ ] Low back pain,  [ ] Joint pain  Hematologic:No history of hypercoagulable state.  No history of easy bleeding.  No history of anemia  Gastrointestinal: No hematochezia or melena,  No gastroesophageal reflux, no trouble swallowing  Urinary: [ ] chronic Kidney disease, [x ] on HD - [ ] MWF or [ ] TTHS, [ ] Burning with urination, [ ]  Frequent urination, [ ] Difficulty urinating;   Skin: No rashes  Psychological: No history of anxiety,  No history of depression   Physical Examination  Vitals:   11/08/18 1538  BP: (!) 151/71  Pulse: 75  Resp: 20  Temp: (!) 97.5 F (36.4 C)  SpO2: 90%  Weight:   236 lb (107 kg)  Height: 5' 8" (1.727 m)    Body mass index is 35.88 kg/m.  General:  Alert and oriented, no acute distress HEENT: Normal Neck: No bruit or JVD Pulmonary: decreased breath sounds to  auscultation bilaterally B lower lobes. Cardiac: Regular Rate and Rhythm without murmur Gastrointestinal: Soft, non-tender, non-distended, no mass, no scars Skin: No rash Extremity Pulses:  No palpable thrill in the left UE fistula, doppler radial signal Musculoskeletal:Left thumb wound  Neurologic: Upper and lower extremity motor grossly intact B.  DATA:   +-----------------+-------------+----------+---------+ Right Cephalic   Diameter (cm)Depth (cm)Findings  +-----------------+-------------+----------+---------+ Shoulder             0.21                         +-----------------+-------------+----------+---------+ Prox upper arm       0.21                         +-----------------+-------------+----------+---------+ Mid upper arm        0.16                         +-----------------+-------------+----------+---------+ Dist upper arm       0.11                         +-----------------+-------------+----------+---------+ Antecubital fossa    0.30                         +-----------------+-------------+----------+---------+ Prox forearm         0.17               branching +-----------------+-------------+----------+---------+ Mid forearm          0.14                         +-----------------+-------------+----------+---------+ Dist forearm         0.14                         +-----------------+-------------+----------+---------+   +-----------------+-------------+----------+--------------+ Right Basilic    Diameter (cm)Depth (cm)   Findings    +-----------------+-------------+----------+--------------+ Prox upper arm                          not visualized +-----------------+-------------+----------+--------------+ Mid upper arm                           not visualized +-----------------+-------------+----------+--------------+ Dist upper arm       0.16                              +-----------------+-------------+----------+--------------+ Antecubital fossa    0.29                              +-----------------+-------------+----------+--------------+ Prox forearm         0.20                              +-----------------+-------------+----------+--------------+  +-----------------+-------------+----------+----------+ Left Cephalic    Diameter (cm)Depth (cm) Findings  +-----------------+-------------+----------+----------+ Shoulder  0.30                          +-----------------+-------------+----------+----------+ Prox upper arm       0.27                          +-----------------+-------------+----------+----------+ Mid upper arm                           thrombosed +-----------------+-------------+----------+----------+ Dist upper arm                          thrombosed +-----------------+-------------+----------+----------+ Antecubital fossa                       thrombosed +-----------------+-------------+----------+----------+ Prox forearm                            thrombosed +-----------------+-------------+----------+----------+ Mid forearm          0.21               branching  +-----------------+-------------+----------+----------+ Dist forearm         0.25                          +-----------------+-------------+----------+----------+  Fluid-filled structure 0.74 cm in diameter noted at the left  wrist. Left Cephalic vein appears thrombosed from the mid upper arm to the proximal forearm.  Left Basilic vein not visualized. Summary: Right: Right cephalic vein patent and compressible. Right Basilic        vein patent and compressible where visualized. Left: Left cephalic vein is thrombosed from the mid upper arm to the       proximal forearm. Basilic vein not identified.  ASSESSMENT:  S/P left UE fistula ligation secondary to steal and left thumb tissue loss.   Left thumb dry gangrene, dressing applied daily without malodor or further tissue loss.   Dr. Dellia Nims was to determine if she needed a specialist for the consideration of thumb amputation in the future.  She was followed by wound care at discharge.  She states she has not seen a hand specialist yet.  We will refer her to a hand surgeon.  She does not appear to have suitable vein for a fistula creation.  She has never had any other prior access procedures.  Her dialysis today is Tuesday Thursday Saturday.  Her nephrologist is Dr. Posey Pronto.    I will schedule her for right UE AV graft placement.     PLAN:  Roxy Horseman PA-C [facility] Office: [phone]  MD in clinic [place]

## 2018-11-14 ENCOUNTER — Emergency Department (HOSPITAL_COMMUNITY)
Admission: EM | Admit: 2018-11-14 | Discharge: 2018-11-14 | Disposition: A | Discharge status: Home / Self Care | Payer: Medicare Other | Attending: Emergency Medicine | Admitting: Emergency Medicine

## 2018-11-14 ENCOUNTER — Emergency Department (HOSPITAL_COMMUNITY): Payer: Medicare Other

## 2018-11-14 ENCOUNTER — Other Ambulatory Visit: Payer: Self-pay

## 2018-11-14 ENCOUNTER — Encounter (HOSPITAL_COMMUNITY): Payer: Self-pay | Admitting: Emergency Medicine

## 2018-11-14 DIAGNOSIS — J189 Pneumonia, unspecified organism: Secondary | ICD-10-CM | POA: Insufficient documentation

## 2018-11-14 DIAGNOSIS — Z79899 Other long term (current) drug therapy: Secondary | ICD-10-CM | POA: Diagnosis not present

## 2018-11-14 DIAGNOSIS — E1122 Type 2 diabetes mellitus with diabetic chronic kidney disease: Secondary | ICD-10-CM | POA: Insufficient documentation

## 2018-11-14 DIAGNOSIS — I2510 Atherosclerotic heart disease of native coronary artery without angina pectoris: Secondary | ICD-10-CM | POA: Insufficient documentation

## 2018-11-14 DIAGNOSIS — R0600 Dyspnea, unspecified: Secondary | ICD-10-CM

## 2018-11-14 DIAGNOSIS — I120 Hypertensive chronic kidney disease with stage 5 chronic kidney disease or end stage renal disease: Secondary | ICD-10-CM | POA: Diagnosis not present

## 2018-11-14 DIAGNOSIS — F17210 Nicotine dependence, cigarettes, uncomplicated: Secondary | ICD-10-CM | POA: Insufficient documentation

## 2018-11-14 DIAGNOSIS — Z20828 Contact with and (suspected) exposure to other viral communicable diseases: Secondary | ICD-10-CM | POA: Insufficient documentation

## 2018-11-14 DIAGNOSIS — N186 End stage renal disease: Secondary | ICD-10-CM | POA: Insufficient documentation

## 2018-11-14 DIAGNOSIS — I712 Thoracic aortic aneurysm, without rupture, unspecified: Secondary | ICD-10-CM

## 2018-11-14 DIAGNOSIS — Z794 Long term (current) use of insulin: Secondary | ICD-10-CM | POA: Diagnosis not present

## 2018-11-14 DIAGNOSIS — Z992 Dependence on renal dialysis: Secondary | ICD-10-CM | POA: Diagnosis not present

## 2018-11-14 LAB — COMPREHENSIVE METABOLIC PANEL
ALT: 20 U/L (ref 0–44)
AST: 23 U/L (ref 15–41)
Albumin: 3.7 g/dL (ref 3.5–5.0)
Alkaline Phosphatase: 126 U/L (ref 38–126)
Anion gap: 17 — ABNORMAL HIGH (ref 5–15)
BUN: 19 mg/dL (ref 6–20)
CO2: 27 mmol/L (ref 22–32)
Calcium: 8.9 mg/dL (ref 8.9–10.3)
Chloride: 93 mmol/L — ABNORMAL LOW (ref 98–111)
Creatinine, Ser: 3.95 mg/dL — ABNORMAL HIGH (ref 0.44–1.00)
GFR calc Af Amer: 14 mL/min — ABNORMAL LOW (ref >60)
GFR calc non Af Amer: 12 mL/min — ABNORMAL LOW (ref >60)
Glucose, Bld: 144 mg/dL — ABNORMAL HIGH (ref 70–99)
Potassium: 3.6 mmol/L (ref 3.5–5.1)
Sodium: 137 mmol/L (ref 135–145)
Total Bilirubin: 1 mg/dL (ref 0.3–1.2)
Total Protein: 8.5 g/dL — ABNORMAL HIGH (ref 6.5–8.1)

## 2018-11-14 LAB — CBC
HCT: 31.5 % — ABNORMAL LOW (ref 36.0–46.0)
Hemoglobin: 9.9 g/dL — ABNORMAL LOW (ref 12.0–15.0)
MCH: 31.2 pg (ref 26.0–34.0)
MCHC: 31.4 g/dL (ref 30.0–36.0)
MCV: 99.4 fL (ref 80.0–100.0)
Platelets: 197 10*3/uL (ref 150–400)
RBC: 3.17 MIL/uL — ABNORMAL LOW (ref 3.87–5.11)
RDW: 16 % — ABNORMAL HIGH (ref 11.5–15.5)
WBC: 9.6 10*3/uL (ref 4.0–10.5)
nRBC: 0 % (ref 0.0–0.2)

## 2018-11-14 LAB — PROTIME-INR
INR: 1 (ref 0.8–1.2)
Prothrombin Time: 13.4 seconds (ref 11.4–15.2)

## 2018-11-14 LAB — SARS CORONAVIRUS 2 BY RT PCR (HOSPITAL ORDER, PERFORMED IN ~~LOC~~ HOSPITAL LAB): SARS Coronavirus 2: NEGATIVE

## 2018-11-14 MED ORDER — AZITHROMYCIN 250 MG PO TABS
500.0000 mg | ORAL_TABLET | Freq: Once | ORAL | Status: AC
  Administered 2018-11-14: 500 mg via ORAL
  Filled 2018-11-14: qty 2

## 2018-11-14 MED ORDER — AZITHROMYCIN 250 MG PO TABS: ORAL_TABLET | ORAL | 0 refills | Status: DC

## 2018-11-14 MED ORDER — IOHEXOL 350 MG/ML SOLN
75.0000 mL | Freq: Once | INTRAVENOUS | Status: AC | PRN
  Administered 2018-11-14: 75 mL via INTRAVENOUS

## 2018-11-14 MED ORDER — AZITHROMYCIN 250 MG PO TABS: 250.0000 mg | ORAL_TABLET | Freq: Every day | ORAL | 0 refills | Status: DC

## 2018-11-14 NOTE — ED Notes (Signed)
Patient verbalizes understanding of discharge instructions. Opportunity for questioning and answers were provided. Armband removed by staff, pt discharged from ED. Pt. ambulatory and discharged home via [facility] 

## 2018-11-14 NOTE — ED Notes (Signed)
Pt ambulated in hall with assistance on room air, and O2 saturation was at 93%.

## 2018-11-14 NOTE — Progress Notes (Signed)
IV started by ED nurse. Cancel VAST consult.

## 2018-11-14 NOTE — ED Notes (Signed)
Pt ambulated with pulse ox approximately 50 ft. Pt showing no signs of difficulty breathing. Pt SpO2 remained 95-97% via room air. Pt was somewhat unsteady on her feet but explained that is is normal for her to be unsteady as she normally uses a cane.

## 2018-11-14 NOTE — ED Provider Notes (Signed)
[facility] EMERGENCY DEPARTMENT Provider Note   CSN: [id number] Arrival date & time: 11/14/18  1355     History   Chief Complaint No chief complaint on file.   HPI [name] is a 53 y.o. female.     HPI  53 year old female end-stage renal disease on dialysis Tuesday Thursday Saturday presents today with dyspnea.  She states that she began having some shortness of breath yesterday.  She was COVID positive in August and has continued to test positive.  She had increasing dyspnea last night.  She went to dialysis and completed her dialysis today.  Her dry weight is 108 and she was dialyzed down to 106.  She returned home and had increased dyspnea and presents here for evaluation.  She denies any fever, change in cough, abdominal pain, nausea, vomiting, diarrhea, or chills.  She feels that she does have some increased peripheral edema.  When she originally tested positive for COVID, it was done as a screening exam prior to surgery.  She denies ever having any significant symptoms.  Past Medical History:  Diagnosis Date  . Anemia   . Arthritis    "knees"  . CAD (coronary artery disease)    Nonobstructive on CT 2019  . ESRD (end stage renal disease) ([place])    Dialysis M/W/F  . Headache(784.0)   . High cholesterol   . Hypertension   . 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 Pericardial effusion   . Pneumonia    x 2   . PVD (peripheral vascular disease) ([place])    Right leg stent in [place].  (No records)  . Restless legs   . Sleep apnea    does not use Cpap  . Type II diabetes mellitus [facility])     Patient Active Problem List   Diagnosis Date Noted  . Shortness of breath 10/23/2018  . Acute respiratory distress 10/23/2018  . Chronic bilateral pleural effusions 10/23/2018  . Obesity 10/23/2018  . Coronary artery disease 10/23/2018  . Tobacco abuse 10/23/2018  . Volume overload 10/23/2018  . Pneumonia 10/10/2018  . COVID-19  virus detected 10/10/2018  . Acute encephalopathy 10/09/2018  . ESRD (end stage renal disease) ([place]) 09/27/2018  . Acute respiratory failure with hypoxia ([place]) 09/26/2018  . Chronic pain disorder 09/26/2018  . COVID-19 virus infection 09/18/2018  . Acute metabolic encephalopathy 91/50/5697  . Gangrene ([place]) 09/18/2018  . Left arm pain 06/27/2018  . Educated about COVID-19 virus infection 06/27/2018  . Anemia 09/26/2017  . Left hip pain 09/26/2017  . Neck pain 09/26/2017  . Leukocytosis 09/26/2017  . Chest pain 07/07/2017  . Left ventricular hypertrophy 06/17/2017  . ESRD on dialysis ([place]) 05/21/2011  . Diabetic retinopathy 05/21/2011  . Diastolic dysfunction 94/80/1655  . Hyperlipidemia 01/30/2011  . Type II diabetes mellitus with complication, uncontrolled ([place]) 01/29/2011  . Neuropathy 01/29/2011  . Hypertension     Past Surgical History:  Procedure Laterality Date  . AV FISTULA PLACEMENT Left   . [place]; 1997   x 2  . COLONOSCOPY    . EYE SURGERY Bilateral    cataract surgery  . INSERTION OF DIALYSIS CATHETER Right 09/26/2018   Procedure: INSERTION OF DIALYSIS CATHETER Right Internal Jugular.;  Surgeon: Elam Dutch, MD;  Location: [facility];  Service: Vascular;  Laterality: Right;  . LIGATION OF ARTERIOVENOUS  FISTULA Left 09/26/2018   Procedure: LIGATION OF ARTERIOVENOUS  FISTULA LEFT ARM;  Surgeon: Elam Dutch, MD;  Location: [facility];  Service: Vascular;  Laterality: Left;  . PERICARDIAL WINDOW  02/2004   for pericardial effusion  . TUBAL LIGATION  05/1995     OB History   No obstetric history on file.      Home Medications    Prior to Admission medications   Medication Sig Start Date End Date Taking? Authorizing Provider  amLODipine (NORVASC) 2.5 MG tablet TK 1 T PO QPM 11/02/18   [provider]  ascorbic acid (VITAMIN C) 500 MG tablet Take 1 tablet (500 mg total) by mouth at bedtime. 10/25/18   Hongalgi, Lenis Dickinson, MD  atorvastatin  (LIPITOR) 80 MG tablet Take 1 tablet (80 mg total) by mouth daily at 6 PM. 10/25/18   Hongalgi, Lenis Dickinson, MD  AURYXIA 1 GM 210 MG(Fe) tablet Take 630 mg by mouth 3 (three) times daily with meals.  05/29/18   [provider]  b complex-vitamin c-folic acid (NEPHRO-VITE) 0.8 MG TABS tablet Take 1 tablet by mouth Every Tuesday,Thursday,and Saturday with dialysis.  07/17/18   [provider]  benzonatate (TESSALON) 200 MG capsule Take 1 capsule (200 mg total) by mouth 3 (three) times daily as needed for cough. 09/27/18   Raiford Noble Latif, DO  blood glucose meter kit and supplies KIT Dispense based on patient and insurance preference. Use up to four times daily as directed. (FOR ICD-9 250.00, 250.01). 10/25/18   Modena Jansky, MD  calcitRIOL (ROCALTROL) 0.5 MCG capsule Take 3 capsules (1.5 mcg total) by mouth Every Tuesday,Thursday,and Saturday with dialysis. 10/26/18   Hongalgi, Lenis Dickinson, MD  diphenoxylate-atropine (LOMOTIL) 2.5-0.025 MG tablet Take 1 tablet by mouth 4 (four) times daily as needed for diarrhea or loose stools. 09/06/17   Kirichenko, Lahoma Rocker, PA-C  gabapentin (NEURONTIN) 100 MG capsule TK 1 C PO TID 11/01/18   [provider]  hydrOXYzine (ATARAX/VISTARIL) 25 MG tablet Take 25 mg by mouth daily.  05/12/17   [provider]  Insulin Lispro Prot & Lispro (HUMALOG MIX 75/25 KWIKPEN) (75-25) 100 UNIT/ML Kwikpen Inject 25-30 Units into the skin 2 (two) times daily with a meal.     [provider]  metoprolol succinate (TOPROL-XL) 25 MG 24 hr tablet Take 4 tablets (100 mg total) by mouth daily. 10/25/18   Hongalgi, Lenis Dickinson, MD  oxyCODONE (ROXICODONE) 15 MG immediate release tablet Take 1 tablet (15 mg total) by mouth daily as needed for pain. 09/20/18   Mariel Aloe, MD  pantoprazole (PROTONIX) 40 MG tablet Take 1 tablet (40 mg total) by mouth daily. 10/14/18 11/13/18  Arrien, Jimmy Picket, MD  pramipexole (MIRAPEX) 0.5 MG tablet Take 0.5 mg by mouth  daily.  05/18/17   [provider]  zinc sulfate 220 (50 Zn) MG capsule Take 1 capsule (220 mg total) by mouth daily. 09/21/18   Mariel Aloe, MD    Family History Family History  Problem Relation Age of Onset  . Cancer Brother   . Heart disease Father        Died age 10  . Diabetes Father   . Hyperlipidemia Father   . Hypertension Father   . Stroke Father   . Diabetes Mother   . Hypertension Mother   . Stroke Mother   . Dementia Mother   . Diabetes Sister   . Diabetes Brother   . Hypertension Sister   . Hypertension Brother     Social History Social History   Tobacco Use  .  Smoking status: Current Every Day Smoker    Packs/day: 0.50    Years: 20.00    Pack years: 10.00    Types: Cigarettes  . Smokeless tobacco: Never Used  . Tobacco comment: Trying to quit  Substance Use Topics  . Alcohol use: No  . Drug use: No     Allergies   Patient has no known allergies.   Review of Systems Review of Systems  All other systems reviewed and are negative.    Physical Exam Updated Vital Signs BP (!) 145/74 (BP Location: Right Arm)   Pulse 85   Temp 97.6 F (36.4 C) (Oral)   Resp 18   Ht 1.651 m (5' 5" )   Wt 107 kg   LMP  (LMP Unknown)   SpO2 96%   BMI 39.25 kg/m   Physical Exam Vitals signs and nursing note reviewed.  Constitutional:      General: She is not in acute distress.    Appearance: She is normal weight.  HENT:     Head: Normocephalic.     Right Ear: External ear normal.     Left Ear: External ear normal.     Nose: Nose normal.     Mouth/Throat:     Mouth: Mucous membranes are moist.  Eyes:     Pupils: Pupils are equal, round, and reactive to light.  Neck:     Musculoskeletal: Normal range of motion.  Cardiovascular:     Rate and Rhythm: Normal rate and regular rhythm.     Pulses: Normal pulses.  Pulmonary:     Effort: Pulmonary effort is normal.     Breath sounds: Normal breath sounds.     Comments: Mild tachypnea  Abdominal:     General: Abdomen is flat. Bowel sounds are normal.     Palpations: Abdomen is soft.  Musculoskeletal: Normal range of motion.  Skin:    General: Skin is warm and dry.     Capillary Refill: Capillary refill takes less than 2 seconds.  Neurological:     General: No focal deficit present.     Mental Status: She is alert. Mental status is at baseline.  Psychiatric:        Mood and Affect: Mood normal.        Behavior: Behavior normal.      ED Treatments / Results  Labs (all labs ordered are listed, but only abnormal results are displayed) Labs Reviewed  SARS CORONAVIRUS 2 BY RT PCR (HOSPITAL ORDER, [place] [facility])  CBC  COMPREHENSIVE METABOLIC PANEL  PROTIME-INR    EKG EKG Interpretation  Date/Time:  Tuesday November 14 2018 14:04:01 EDT Ventricular Rate:  80 PR Interval:    QRS Duration: 157 QT Interval:  482 QTC Calculation: 557 R Axis:   75 Text Interpretation:  Sinus rhythm Supraventricular bigeminy Right bundle branch block Confirmed by Pattricia Boss [phone]) on 11/14/2018 2:22:59 PM   Radiology Dg Chest Port 1 View  Result Date: 11/14/2018 CLINICAL DATA:  Shortness of breath, chest pain EXAM: PORTABLE CHEST 1 VIEW COMPARISON:  10/25/2018 FINDINGS: No significant change in AP portable examination with bibasilar heterogeneous opacity, possible small layering pleural effusions, cardiomegaly, and large bore right chest multi lumen vascular catheter. There is no new airspace opacity. IMPRESSION: 1.  Cardiomegaly. 2. Unchanged bibasilar opacities which may reflect atelectasis or infection/aspiration. Electronically Signed   By: Eddie Candle M.D.   On: 11/14/2018 15:08    Procedures Procedures (including critical care time)  Medications  Ordered in ED Medications - No data to display   Initial Impression / Assessment and Plan / ED Course  I have reviewed the triage vital signs and the nursing notes.  Pertinent labs & imaging  results that were available during my care of the patient were reviewed by me and considered in my medical decision making (see chart for details).      53 year old female on dialysis presents today with dyspnea.  She has had a positive cover test in August but has been asymptomatic.  She has continued to have positive test.  She goes to dialysis on Tuesday Thursday Saturday.  She reports having some increased dyspnea yesterday.  She was dialyzed down below her dry weight of [id number] today.  She continued to feel short of breath.  On arrival here, sats 93 to 96% on room air and otherwise vital signs appear stable though she is slightly tachypneic.  Lung exam was clear.  On physical exam, she does not appear to be volume overloaded.  Chest x-Geovannie Vilar reveals unchanged bibasilar opacities.  CT Angie of chest is pending to evaluate for pulmonary embolism. Discussed with Dr. Sedonia Small who  will follow-up and reevaluate patient  Final Clinical Impressions(s) / ED Diagnoses   Final diagnoses:  Dyspnea, unspecified type    ED Discharge Orders    None       Pattricia Boss, MD 11/14/18 [phone]

## 2018-11-14 NOTE — ED Provider Notes (Signed)
  Provider Note MRN:  [id number]  Arrival date & time: 11/14/18    ED Course and Medical Decision Making  Assumed care from Dr. Jeanell Sparrow at shift change.  Presenting with dyspnea, recent COVID, awaiting CTA chest.  Candidate for discharge if work-up is negative and patient ambulates without issue.  7 PM update: Work-up is reassuring.  No pulmonary embolism.  Atypical pneumonia, given patient's recent symptoms thought to be a secondary bacterial pneumonia.  Patient ambulated with pulse ox without issue.  No desaturations.  Currently without indication for admission or further testing.  Appropriate for discharge with azithromycin and close PCP follow-up.    Final Clinical Impressions(s) / ED Diagnoses     ICD-10-CM   1. Dyspnea, unspecified type  R06.00   2. Atypical pneumonia  J18.9   3. Thoracic aortic aneurysm without rupture [facility])  I71.2     ED Discharge Orders         Ordered    azithromycin (ZITHROMAX) 250 MG tablet     11/14/18 1922            Discharge Instructions     You were evaluated in the Emergency Department and after careful evaluation, we did not find any emergent condition requiring admission or further testing in the hospital.  Your symptoms seem to be due to pneumonia.  Please take the antibiotics as directed.  Per your request, you can follow-up with the pulmonary experts provided.  Your CAT scan also revealed a thoracic aortic aneurysm.  This condition needs close follow-up and likely repeat imaging.  Please inform your vascular surgeons about this condition.  Please return to the Emergency Department if you experience any worsening of your condition.  We encourage you to follow up with a primary care provider.  Thank you for allowing [place] to be a part of your care.     Barth Kirks. Sedonia Small, [place] [email or web address]    Maudie Flakes, MD 11/14/18 1924

## 2018-11-14 NOTE — Discharge Instructions (Addendum)
You were evaluated in the Emergency Department and after careful evaluation, we did not find any emergent condition requiring admission or further testing in the hospital.  Your symptoms seem to be due to pneumonia.  Please take the antibiotics as directed.  Per your request, you can follow-up with the pulmonary experts provided.  Your CAT scan also revealed a thoracic aortic aneurysm.  This condition needs close follow-up and likely repeat imaging.  Please inform your vascular surgeons about this condition.  Please return to the Emergency Department if you experience any worsening of your condition.  We encourage you to follow up with a primary care provider.  Thank you for allowing [place] to be a part of your care.

## 2018-11-14 NOTE — ED Triage Notes (Signed)
[facility]- pt here with complaints of SOB and CP starting today. Pt went to dialysis this morning and noticed she was SOB but dialysis did not seem concerned. Pt states she was underweight at her appointment today but received a full treatment. EMS gave 324 ASA with no relief.    164/80 90% RA 100% NRB

## 2018-11-16 ENCOUNTER — Encounter (HOSPITAL_COMMUNITY): Payer: Self-pay | Admitting: *Deleted

## 2018-11-16 ENCOUNTER — Other Ambulatory Visit: Payer: Self-pay

## 2018-11-16 MED ORDER — LIDOCAINE-PRILOCAINE 2.5-2.5 % EX CREA: 1.0000 "application " | TOPICAL_CREAM | CUTANEOUS | Status: DC | PRN

## 2018-11-16 MED ORDER — ALTEPLASE 2 MG IJ SOLR: 2.0000 mg | Freq: Once | INTRAMUSCULAR | Status: DC | PRN

## 2018-11-16 MED ORDER — LIDOCAINE HCL (PF) 1 % IJ SOLN
5.0000 mL | INTRAMUSCULAR | Status: DC | PRN
  Filled 2018-11-16: qty 5

## 2018-11-16 MED ORDER — LIDOCAINE HCL (PF) 1 % IJ SOLN: 5.0000 mL | INTRAMUSCULAR | Status: DC | PRN

## 2018-11-16 MED ORDER — SODIUM CHLORIDE 0.9 % IV SOLN: 100.0000 mL | INTRAVENOUS | Status: DC | PRN

## 2018-11-16 MED ORDER — PENTAFLUOROPROP-TETRAFLUOROETH EX AERO: 1.0000 "application " | INHALATION_SPRAY | CUTANEOUS | Status: DC | PRN

## 2018-11-16 MED ORDER — LIDOCAINE-PRILOCAINE 2.5-2.5 % EX CREA
1.0000 "application " | TOPICAL_CREAM | CUTANEOUS | Status: DC | PRN
  Filled 2018-11-16: qty 5

## 2018-11-16 MED ORDER — HEPARIN SODIUM (PORCINE) 1000 UNIT/ML DIALYSIS: 1000.0000 [IU] | INTRAMUSCULAR | Status: DC | PRN

## 2018-11-16 MED ORDER — HEPARIN SODIUM (PORCINE) 1000 UNIT/ML DIALYSIS
100.0000 [IU]/kg | INTRAMUSCULAR | Status: DC | PRN
  Filled 2018-11-16: qty 11

## 2018-11-16 MED ORDER — HEPARIN SODIUM (PORCINE) 1000 UNIT/ML DIALYSIS: 100.0000 [IU]/kg | INTRAMUSCULAR | Status: DC | PRN

## 2018-11-16 MED ORDER — ALTEPLASE 2 MG IJ SOLR
2.0000 mg | Freq: Once | INTRAMUSCULAR | Status: DC | PRN
  Filled 2018-11-16: qty 2

## 2018-11-16 MED ORDER — HEPARIN SODIUM (PORCINE) 1000 UNIT/ML DIALYSIS
1000.0000 [IU] | INTRAMUSCULAR | Status: DC | PRN
  Filled 2018-11-16: qty 1

## 2018-11-16 MED ORDER — SODIUM CHLORIDE 0.9 % IV SOLN
100.0000 mL | INTRAVENOUS | Status: DC | PRN
  Administered 2018-12-27 (×2): via INTRAVENOUS

## 2018-11-16 MED ORDER — PENTAFLUOROPROP-TETRAFLUOROETH EX AERO
1.0000 "application " | INHALATION_SPRAY | CUTANEOUS | Status: DC | PRN
  Filled 2018-11-16: qty 116

## 2018-11-16 NOTE — Progress Notes (Addendum)
Ms [name] denies chest pain or shortness of breath. Patient tested negative 11/14/2018 for Covid; patient had Covid 11/2018. Ms [name] was seen in ED 11/14/2018 with "a little pneumonia, patient was started on antibiotics and should finish them 11/19/2018- patient reports that she is feeling much better. I will in box Dr Ainsley Spinner nurse to inform her. Ms [name] has type II diabetes, I instructed patient to take 21 units of 72/25 on 11/19/2018 pm dose.  I instructed patient to not take Insulin Monday am.I instructed patient to check CBG after awaking and every 2 hours until arrival  to the hospital.  I Instructed patient if CBG is less than 70 to drink 1/2 cup of a clear juice. Recheck CBG in 15 minutes then call pre- op desk at [phone] for further instructions.  Ms [name] said if CBG is low she could eat some Oreos, I informed her if she ate anything that her surgery would be cancelled. I asked Anesthesia PA-C to review chart.

## 2018-11-17 ENCOUNTER — Encounter (HOSPITAL_COMMUNITY): Payer: Self-pay | Admitting: Vascular Surgery

## 2018-11-17 NOTE — Anesthesia Preprocedure Evaluation (Deleted)
Anesthesia Evaluation    Airway        Dental   Pulmonary Current Smoker,           Cardiovascular hypertension,      Neuro/Psych    GI/Hepatic   Endo/Other  diabetes  Renal/GU      Musculoskeletal   Abdominal   Peds  Hematology   Anesthesia Other Findings   Reproductive/Obstetrics                             Anesthesia Physical Anesthesia Plan  ASA:   Anesthesia Plan:    Post-op Pain Management:    Induction:   PONV Risk Score and Plan:   Airway Management Planned:   Additional Equipment:   Intra-op Plan:   Post-operative Plan:   Informed Consent:   Plan Discussed with:   Anesthesia Plan Comments: (See PAT note written 11/17/2018 by Myra Gianotti, PA-C. 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 Percival Spanish).   COVID-19 positive 09/14/18--continued to test positive 10/09/18 and 10/23/18. Negative COVID-19 test 11/14/18 during ED visit, treated for possible atypical pneumonia.  )        Anesthesia Quick Evaluation

## 2018-11-17 NOTE — Progress Notes (Signed)
Anesthesia Chart Review: [name]   Case: [id number] Date/Time: 11/20/18 1149   Procedure: INSERTION OF ARTERIOVENOUS (AV) GORE-TEX GRAFT RIGHT ARM (Right )   Anesthesia type: General   Pre-op diagnosis: END STAGE RENAL DISEASE   Location: [facility] OR ROOM 12 / [place] [facility]   Surgeon: Marty Heck, MD      DISCUSSION: Patient is a 53 year old female scheduled for the above procedure. She needs permanent hemodialysis access.  She is s/p ligation of LUE AVF (for steal syndrome with ischemic left hand) and insertion of right IJ Palidrome catheter on 09/26/18. That surgery had to initially be delayed on 09/18/18 for acute encephalopathy in the setting of COVID-19 (positive test 09/14/18). Following 09/26/18 surgery, she required overnight stay due to post-operative lethargy minimally responsive to Narcan with hypoxia (requiring O2 mask to keep sats > 90%). Hospitalist was consulted. Anesthesia effects and on-going mild COVID-19 respiratory symptoms felt to be contributing to her symptoms.She was weaned from supplemental O2 and discharged home 09/27/18.   History includesESRD (HD TTS, [facility]), smoking, HTN, DM2, hypercholesterolemia, PVD (s/p RLE stent), anemia, CAD (non-obstructive 2019 coronary CT), LVH, pericarditis with pericardial effusion (s/p pericardial window 02/07/04).She is followed by Dr. 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 Had moderate to severe MR on 10/2018 echo.  Since 09/26/18 surgery she has had three additional hospital evaluations: - Admission 10/09/18-10/14/18 for chest pain, cough, dyspnea in setting of anemia and COVID-19 pneumonia. COVID-19 test still positive (since 09/14/18). She became lethargic in the ED and received Narcan. Admitted for metabolic encephalopathy complicated by acute hypoxic respiratory  failure, volume overload. Recurrent active COVID-19 pneumonia "ruled out". Volume status improved with hemodialysis. She received 3 Units PRBC. GI recommended a CT abd/pelvs which was negative for GI mass, but urology follow-up recommended for right renal mass.   - Admission 10/23/18-10/25/18 for acute hypoxic respiratory failure likely due to fluid overload from acute diastolic CHF and end-stage renal disease versus flash pulmonary edema due to hypertensive emergency. Treated with O2 and IV Nitro.  PE ruled out.  She underwent urgent hemodialysis.  By notes, imaging findings felt likely related to pulmonary edema rather than COVID-19 pneumonia. Echo showed moderate-severe MR with plans for out-patient cardiology follow-up. Again CT showed right renal mass, also some splenic lesions and adenopathy with notes suggesting nephrology to arrange appropriate follow-up. Continued to test positive for COVID-19.  - ED visit 11/14/18 for SOB following hemodialysis. Denied fever, chills change in cough, N/V/D.  COVID-19 test negative. Chest CTA negative for PE, but showed multiple areas of ground-glass type opacity, concerning for atypical organism pneumonia, 3 mm nodular opacity RUL (consider 12 month follow-up if high risk), and 4.4 cm ascending TAA. No desaturations with ambulation. Discharged home with azithromycin.    - PAT Phone RN spoke with patient on 11/16/18. Patient reports she is feeling much better and scheduled to finish antibiotics on 11/19/18.   As above, COVID-19 + 09/14/18, 10/09/18, 10/23/18. Negative COVID-19 test on 11/14/18.   Discussed case with anesthesiologist Josephine Igo, MD. Anesthesia team to evaluate on the day of surgery. Definitive plan following lab results and exam findings.    VS:  Wt Readings from Last 3 Encounters:  11/14/18 107 kg  11/08/18 107 kg  10/25/18 107.3 kg   Temp Readings from Last 3 Encounters:  11/14/18 36.4 C (  Oral)  11/08/18 (!) 36.4 C  10/25/18 36.8 C  (Oral)   BP Readings from Last 3 Encounters:  11/14/18 (!) 176/89  11/08/18 (!) 151/71  10/25/18 (!) 176/85   Pulse Readings from Last 3 Encounters:  11/14/18 82  11/08/18 75  10/25/18 75     PROVIDERS: Sandi Mariscal, MDis PCP  Minus Breeding, MD is cardiologist. Last visit 06/27/18. He ordered a cardiac MRI given severe LVH. Possible variant morphology of hypertrophic CM, but study non-diagnostic since gadolinium could not be sued given renal disease. He plans to see her back in September to discuss next steps/testing.(Has office virtual visit scheduled for 11/21/18.)    LABS:She is for labs on the day of surgery. As of 11/14/18, WBC 9.6, H/H 9.9/31.5, PLT 197, Cr 3.95, glucose 133. A1c 8.7 on 09/19/18 (down from 10.7 on 06/01/17).   IMAGES: CTA Chest 11/14/18: IMPRESSION: 1.  No demonstrable pulmonary embolus. 2. Dilatation of the main pulmonary outflow tract, a finding indicative of pulmonary arterial hypertension. 3. Ascending thoracic aortic prominence measuring 4.4 x 4.3 cm. No dissection evident. There are foci of aortic atherosclerosis as well as foci of great vessel and coronary artery calcification. There is left ventricular hypertrophy. Recommend annual imaging followup by CTA or MRA. This recommendation follows 2010 ACCF/AHA/AATS/ACR/ASA/SCA/SCAI/SIR/STS/SVM Guidelines for the Diagnosis and Management of Patients with Thoracic Aortic Disease. Circulation. 2010; 121: J242-A834. Aortic aneurysm NOS (ICD10-I71.9) 4. Adenopathy at several sites of uncertain etiology. Neoplastic etiology cannot be excluded. 5. Multiple areas of ground-glass type opacity, concerning for atypical organism pneumonia. Areas of atelectatic change also noted. Mild consolidation in the inferior lingula. 3 mm nodular opacity in the posterior segment right upper lobe noted. No follow-up needed if patient is low-risk. Non-contrast chest CT can be considered in 12 months if patient is high-risk.  This recommendation follows the consensus statement: Guidelines for Management of Incidental Pulmonary Nodules Detected on CT Images: From the Fleischner Society 2017; Radiology 2017; 284:228-243. 6. Spleen appears prominent. Note that spleen is incompletely visualized. Splenic prominence as well as adenopathy potentially could be indicative of underlying lymphoma. Aortic Atherosclerosis (ICD10-I70.0).  (Comparison Chest CTA 10/23/18: Lungs/Pleura: There are patchy ground-glass opacities within the right middle lobe and both lower lobes, more prominent than on prior abdominal CT in the lung bases. Linear atelectasis in the left upper lobe. No definite septal thickening. No confluent airspace disease. Trace atypical emphysema. The trachea and mainstem bronchi are Patent.)   1V PCXR 11/14/18: COMPARISON:  10/25/2018 FINDINGS: No significant change in AP portable examination with bibasilar heterogeneous opacity, possible small layering pleural effusions, cardiomegaly, and large bore right chest multi lumen vascular catheter. There is no new airspace opacity. IMPRESSION: 1.  Cardiomegaly. 2. Unchanged bibasilar opacities which may reflect atelectasis or infection/aspiration.   EKG:  EKG 11/14/18: Sinus rhythm Supraventricular bigeminy Right bundle branch block Confirmed by Pattricia Boss [phone]) on 11/14/2018 2:22:59 PM - Known history of RBB and PACs   CV: Echo 10/23/18: IMPRESSIONS  1. Left ventricular ejection fraction, by visual estimation, is 55 to 60%. The left ventricle has normal function. Normal left ventricular size. There is moderately increased left ventricular hypertrophy.  2. Global right ventricle has normal systolic function.The right ventricular size is mildly enlarged. No increase in right ventricular wall thickness.  3. Left atrial size was moderately dilated.  4. Right atrial size was mildly dilated.  5. Mild aortic valve annular calcification.  6. The  mitral valve is normal in structure. Moderate to severe mitral valve regurgitation.  No evidence of mitral stenosis.  7. The tricuspid valve is normal in structure. Tricuspid valve regurgitation was not visualized by color flow Doppler.  8. The aortic valve is normal in structure. Aortic valve regurgitation was not visualized by color flow Doppler. Mild to moderate aortic valve sclerosis/calcification without any evidence of aortic stenosis.  9. The pulmonic valve was normal in structure. Pulmonic valve regurgitation is mild by color flow Doppler. 10. Moderately elevated pulmonary artery systolic pressure. The estimated right ventricular systolic pressure is moderately elevated at 40.9 mmHg. 11. The inferior vena cava is dilated in size with <50% respiratory variability, suggesting right atrial pressure of 15 mmHg. (Comparison echo 06/29/17: LVEF 50-55%, grade 1 DD, mild MR, PA peak pressure: 41 mm Hg)    MR Cardiac 07/31/18: IMPRESSION: 1. Normal biventricular chamber size and function. 2. Asymmetric septal hypertrophy, 19 mm at the LV basal anteroseptum and anterior wall, and 15 mm at the LV mid inferoseptum. No significant flow dephasing to suggest LV outflow tract obstruction. No evidence of systolic anterior motion of the mitral valve. These findings in combination could suggest a variant morphology of hypertrophic cardiomyopathy. 3. Gadolinium contrast was not administered due to end stage renal disease. Unable to assess for burden of delayed enhancement in the evaluation of possible hypertrophic cardiomyopathy or other cardiomyopathic process.   CT Coronary with FFR 07/08/17: FINDINGS: Aorta:Normal size. Mild diffuse calcifications. No dissection. Aortic Valve:Trileaflet. No calcifications. Coronary Arteries:Normal coronary origin. Right dominance. -RCAis a large dominant artery that gives rise to PDA and PLVB. There mild to moderate diffuse calcified plaque with  maximum stenosis 50-69% in proximal to mid segment. PDA and PLA are poorly visualized secondary to patients size. -Left main is a large artery that gives rise to LAD and LCX arteries. Left main has no plaque. -LADis a large vessel that gives rise to one diagonal artery. There is diffuse moderate calcified plaque with maximum stenosis of 50-69% in the mid segment. -D1is a medium size artery with mild diffuse calcified plaque and maximum stenosis 25-50% in the proximal portion. -LCX is a medium size non-dominant artery that gives rise to one large OM1 branch. There is only minimal calcified plaque. Other findings: -Normal pulmonary vein drainage into the left atrium. -Normal let atrial appendage without a thrombus. -Severely dilated pulmonary artery measuring 48 mm suggestive of pulmonary hypertension. IMPRESSION: 1. Coronary calcium score of 837. This was 24 percentile for age and sex matched control. 2. Normal coronary origin with right dominance. 3. Diffuse calcified plaque with moderate CAD in the proximal to mid RCA and mid LAD. Additional analysis with CT FFR will be submitted. Aggressive risk factor modification should be initiated. 4. Severely dilated pulmonary artery measuring 48 mm suggestive of pulmonary hypertension. (I do not see the FFR report, but cardiology notes indicate non-obstructive CAD with medication therapy recommended.)     Past Medical History:  Diagnosis Date  . Anemia   . Anxiety   . Arthritis    "knees", "RA"  . CAD (coronary artery disease)    Nonobstructive on CT 2019  . COVID-19 10/2018  . ESRD (end stage renal disease) ([place])    Dialysis TTHSat- 3rd   . Headache(784.0)   . Heart murmur   . High cholesterol   . History of blood transfusion   . Hypertension   . 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 Pericardial effusion   . Pneumonia    ; 11/16/2018- "touch  of pneumonia" - seen in ED- 11/14/2018- on  antibiotic  . PVD (peripheral vascular disease) ([place])    Right leg stent in [place].  (No records)  . Restless legs   . Sleep apnea    does not use Cpap  . Type II diabetes mellitus ([place])     Past Surgical History:  Procedure Laterality Date  . AV FISTULA PLACEMENT Left   . [place]; 1997   x 2  . COLONOSCOPY    . EYE SURGERY Bilateral    cataract surgery  . INSERTION OF DIALYSIS CATHETER Right 09/26/2018   Procedure: INSERTION OF DIALYSIS CATHETER Right Internal Jugular.;  Surgeon: Elam Dutch, MD;  Location: [facility];  Service: Vascular;  Laterality: Right;  . LIGATION OF ARTERIOVENOUS  FISTULA Left 09/26/2018   Procedure: LIGATION OF ARTERIOVENOUS  FISTULA LEFT ARM;  Surgeon: Elam Dutch, MD;  Location: [facility];  Service: Vascular;  Laterality: Left;  . PERICARDIAL WINDOW  02/2004   for pericardial effusion  . TUBAL LIGATION  05/1995    MEDICATIONS: . 0.9 %  sodium chloride infusion  . 0.9 %  sodium chloride infusion  . alteplase (CATHFLO ACTIVASE) injection 2 mg  . heparin injection 1,000 Units  . heparin injection 10,900 Units  . heparin injection 10,900 Units  . lidocaine (PF) (XYLOCAINE) 1 % injection 5 mL  . lidocaine-prilocaine (EMLA) cream 1 application  . pentafluoroprop-tetrafluoroeth (GEBAUERS) aerosol 1 application   . amLODipine (NORVASC) 2.5 MG tablet  . ascorbic acid (VITAMIN C) 500 MG tablet  . atorvastatin (LIPITOR) 80 MG tablet  . AURYXIA 1 GM 210 MG(Fe) tablet  . azithromycin (ZITHROMAX) 250 MG tablet  . b complex-vitamin c-folic acid (NEPHRO-VITE) 0.8 MG TABS tablet  . CHANTIX STARTING MONTH PAK 0.5 MG X 11 & 1 MG X 42 tablet  . diphenoxylate-atropine (LOMOTIL) 2.5-0.025 MG tablet  . Flaxseed, Linseed, (FLAX SEED OIL PO)  . hydrOXYzine (ATARAX/VISTARIL) 25 MG tablet  . Insulin Lispro Prot & Lispro (HUMALOG MIX 75/25 KWIKPEN) (75-25) 100 UNIT/ML Kwikpen  . metoprolol succinate (TOPROL-XL) 25 MG 24 hr tablet  . Omega-3 1000 MG  CAPS  . oxyCODONE (ROXICODONE) 15 MG immediate release tablet  . pantoprazole (PROTONIX) 40 MG tablet  . pramipexole (MIRAPEX) 0.5 MG tablet  . zinc sulfate 220 (50 Zn) MG capsule  . benzonatate (TESSALON) 200 MG capsule  . blood glucose meter kit and supplies KIT  . calcitRIOL (ROCALTROL) 0.5 MCG capsule     Myra Gianotti, PA-C Surgical Short Stay/Anesthesiology [facility] Phone [phone] [facility] Phone [phone] 11/17/2018 1:56 PM

## 2018-11-20 ENCOUNTER — Ambulatory Visit (HOSPITAL_COMMUNITY): Admission: RE | Admit: 2018-11-20 | Payer: Medicare Other | Source: Home / Self Care | Admitting: Vascular Surgery

## 2018-11-20 DIAGNOSIS — E785 Hyperlipidemia, unspecified: Secondary | ICD-10-CM | POA: Insufficient documentation

## 2018-11-20 HISTORY — DX: Personal history of other medical treatment: Z92.89

## 2018-11-20 HISTORY — DX: Anxiety disorder, unspecified: F41.9

## 2018-11-20 HISTORY — DX: Nonrheumatic mitral (valve) insufficiency: I34.0

## 2018-11-20 SURGERY — INSERTION OF ARTERIOVENOUS (AV) GORE-TEX GRAFT ARM
Anesthesia: General | Laterality: Right

## 2018-11-20 NOTE — Progress Notes (Signed)
No show

## 2018-11-20 NOTE — OR Nursing (Signed)
At [phone], OR control desk notified procedure cancelled due to patient having developed pneumonia.  Dr. Carlis Abbott paged to be notified regarding cancellation.

## 2018-11-21 ENCOUNTER — Telehealth (INDEPENDENT_AMBULATORY_CARE_PROVIDER_SITE_OTHER): Payer: Medicare Other | Admitting: Cardiology

## 2018-11-21 DIAGNOSIS — E118 Type 2 diabetes mellitus with unspecified complications: Secondary | ICD-10-CM

## 2018-11-21 DIAGNOSIS — I2510 Atherosclerotic heart disease of native coronary artery without angina pectoris: Secondary | ICD-10-CM

## 2018-11-21 DIAGNOSIS — E785 Hyperlipidemia, unspecified: Secondary | ICD-10-CM

## 2018-11-21 DIAGNOSIS — Z794 Long term (current) use of insulin: Secondary | ICD-10-CM

## 2018-11-21 DIAGNOSIS — I10 Essential (primary) hypertension: Secondary | ICD-10-CM

## 2018-11-21 DIAGNOSIS — I517 Cardiomegaly: Secondary | ICD-10-CM

## 2018-11-29 ENCOUNTER — Inpatient Hospital Stay (HOSPITAL_COMMUNITY): Admission: RE | Admit: 2018-11-29 | Payer: Medicare Other | Source: Ambulatory Visit

## 2018-12-11 ENCOUNTER — Ambulatory Visit: Payer: Medicare Other | Admitting: Cardiology

## 2018-12-13 ENCOUNTER — Other Ambulatory Visit (HOSPITAL_COMMUNITY): Payer: Self-pay

## 2018-12-13 ENCOUNTER — Other Ambulatory Visit (HOSPITAL_COMMUNITY): Payer: Medicare Other

## 2018-12-13 NOTE — Progress Notes (Signed)
Pt contacted prior to appointment to notify that covid testing would not be necessary prior to surgery d/t previous positive results within the last 90 days per anesthesia guidelines. Pt's appointment cancelled for today.   Jacqlyn Larsen, RN

## 2018-12-14 ENCOUNTER — Other Ambulatory Visit: Payer: Self-pay

## 2018-12-14 ENCOUNTER — Encounter (HOSPITAL_COMMUNITY): Payer: Self-pay | Admitting: Vascular Surgery

## 2018-12-14 ENCOUNTER — Encounter (HOSPITAL_COMMUNITY): Payer: Self-pay | Admitting: *Deleted

## 2018-12-14 NOTE — Anesthesia Preprocedure Evaluation (Deleted)
Anesthesia Evaluation  Patient identified by MRN, date of birth, ID band Patient awake    Reviewed: Allergy & Precautions, NPO status , Patient's Chart, lab work & pertinent test results  Airway Mallampati: II  TM Distance: >3 FB Neck ROM: Full    Dental  (+) Dental Advisory Given   Pulmonary shortness of breath, sleep apnea , pneumonia, Current Smoker,    Pulmonary exam normal breath sounds clear to auscultation       Cardiovascular hypertension, Pt. on medications + CAD and + Peripheral Vascular Disease  Normal cardiovascular exam+ Valvular Problems/Murmurs  Rhythm:Regular Rate:Normal     Neuro/Psych  Headaches, PSYCHIATRIC DISORDERS Anxiety    GI/Hepatic negative GI ROS, Neg liver ROS,   Endo/Other  negative endocrine ROSdiabetes  Renal/GU Renal disease     Musculoskeletal  (+) Arthritis ,   Abdominal   Peds  Hematology  (+) Blood dyscrasia, anemia ,   Anesthesia Other Findings   Reproductive/Obstetrics negative OB ROS                                                             Anesthesia Evaluation  Patient identified by MRN, date of birth, ID band Patient unresponsive    Reviewed: Allergy & Precautions, Patient's Chart, lab work & pertinent test results  Airway      Mouth opening: Limited Mouth Opening  Dental   Pulmonary Current Smoker,    breath sounds clear to auscultation       Cardiovascular hypertension,  Rhythm:Regular Rate:Normal     Neuro/Psych    GI/Hepatic   Endo/Other  diabetes  Renal/GU      Musculoskeletal   Abdominal   Peds  Hematology   Anesthesia Other Findings   Reproductive/Obstetrics                            Anesthesia Physical Anesthesia Plan  ASA: III  Anesthesia Plan: General   Post-op Pain Management:    Induction: Intravenous  PONV Risk Score and Plan: Ondansetron  Airway  Management Planned: Oral ETT  Additional Equipment:   Intra-op Plan:   Post-operative Plan: Extubation in OR  Informed Consent: I have reviewed the patients History and Physical, chart, labs and discussed the procedure including the risks, benefits and alternatives for the proposed anesthesia with the patient or authorized representative who has indicated his/her understanding and acceptance.       Plan Discussed with: CRNA and Anesthesiologist  Anesthesia Plan Comments: (See PAT note written 09/22/2018 by Myra Gianotti, PA-C.  + COVID 09/14/18. Surgery initially scheduled for 09/18/18 but became lethargic and admitted to the ICU. Discharged home 09/20/18. Dr. Oneida Alar did not feel case could be delayed for 21 days, so continue COVID-19 precautions. )       Anesthesia Quick Evaluation  Anesthesia Physical Anesthesia Plan  ASA: III  Anesthesia Plan: General   Post-op Pain Management:    Induction: Intravenous  PONV Risk Score and Plan: 3 and Ondansetron, Dexamethasone and Treatment may vary due to age or medical condition  Airway Management Planned: Oral ETT  Additional Equipment: None  Intra-op Plan:   Post-operative Plan: Extubation in OR  Informed Consent: I have reviewed the patients History and Physical, chart, labs and discussed the procedure including  the risks, benefits and alternatives for the proposed anesthesia with the patient or authorized representative who has indicated his/her understanding and acceptance.     Dental advisory given  Plan Discussed with: CRNA  Anesthesia Plan Comments: (PAT note written 12/14/2018 by Myra Gianotti, PA-C. )       Anesthesia Quick Evaluation

## 2018-12-14 NOTE — Progress Notes (Signed)
[name] denies chest pain or shortness of breath.  [name] denies any S/S of Covid  For her or those in he home.  [name] has type II diabetes, patient reports that CBGs run around 180.  I instructed patient to take 24 units of 70/25 tonight at dinner tonight and 0 Insulin in am.  I instructed patient to check CBG after awaking and every 2 hours until arrival  to the hospital.  I Instructed patient if CBG is less than 70 to  Drink 1/2 cup of a clear juice. Recheck CBG in 15 minutes then call pre- op desk at [phone] for further instructions

## 2018-12-14 NOTE — Progress Notes (Signed)
Anesthesia Chart Review: SAME DAY WORK-UP   Case: [id number] Date/Time: 12/15/18 0915   Procedure: INSERTION OF ARTERIOVENOUS (AV) GORE-TEX GRAFT RIGHT ARM (Right )   Anesthesia type: General   Pre-op diagnosis: END STAGE RENAL DISEASE   Location: [facility] OR ROOM 11 / [place] [facility]   Surgeon: Marty Heck, MD      DISCUSSION: Patient is a 53 year old female scheduled for the above procedure. Surgery initially scheduled for 11/20/18, but surgery postponed due to recent treatment for pneumonia (completed antibiotics 11/19/18). She needs permanent hemodialysis access.  She is s/p ligation of LUE AVF (for steal syndrome with ischemic left hand) and insertion of right IJ Palidrome catheter on 09/26/18. That surgery had to initially be delayed on 09/18/18 for acute encephalopathy in the setting of COVID-19 (positive test 09/14/18). Following 09/26/18 surgery, she required overnight stay due to post-operative lethargy minimally responsive to Narcan with hypoxia (requiring O2 mask to keep sats > 90%). Hospitalist was consulted. Anesthesia effects and on-going mild COVID-19 respiratory symptoms felt to be contributing to her symptoms.She was weaned from supplemental O2 and discharged home 09/27/18.   History includesESRD (HD TTS, [facility]), smoking, HTN, DM2, hypercholesterolemia, PVD (s/p RLE stent), anemia, CAD (non-obstructive 2019 coronary CT), LVH, pericarditis with pericardial effusion (s/p pericardial window 02/07/04).She is followed by Dr. 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 Had moderate to severe MR on 10/2018 echo.  Since 09/26/18 surgery she has had three additional hospital evaluations: - Admission 10/09/18-10/14/18 for chest pain, cough, dyspnea in setting of anemia and COVID-19 pneumonia. COVID-19 test still positive (since  09/14/18). She became lethargic in the ED and received Narcan. Admitted for metabolic encephalopathy complicated by acute hypoxic respiratory failure, volume overload. Recurrent active COVID-19 pneumonia "ruled out". Volume status improved with hemodialysis. She received 3 Units PRBC. GI recommended a CT abd/pelvs which was negative for GI mass, but urology follow-up recommended for right renal mass.   - Admission 10/23/18-10/25/18 for acute hypoxic respiratory failure likely due to fluid overload from acute diastolic CHF and end-stage renal disease versus flash pulmonary edema due to hypertensive emergency. Treated with O2 and IV Nitro.  PE ruled out.  She underwent urgent hemodialysis.  By notes, imaging findings felt likely related to pulmonary edema rather than COVID-19 pneumonia. Echo showed moderate-severe MR with plans for out-patient cardiology follow-up. Again CT showed right renal mass, also some splenic lesions and adenopathy with notes suggesting nephrology to arrange appropriate follow-up. Continued to test positive for COVID-19.  - ED visit 11/14/18 for SOB following hemodialysis. Denied fever, chills change in cough, N/V/D.  COVID-19 test negative. Chest CTA negative for PE, but showed multiple areas of ground-glass type opacity, concerning for atypical organism pneumonia, 3 mm nodular opacity RUL (consider 12 month follow-up if high risk), and 4.4 cm ascending TAA. No desaturations with ambulation. Discharged home with azithromycin.    As above, COVID-19 + 09/14/18, 10/09/18, 10/23/18. Negative COVID-19 test on 11/14/18. By 12/13/18 notes, patient would not require a repeat preoperative COVID-19 test (per guidelines) due to previous positive result within the past 90 days. As of 12/14/18, patient denied chest pain and SOB. She is a same day work-up, so anesthesia team to evaluate on the day of surgery.    PROVIDERS: Sandi Mariscal, MD is PCP Minus Breeding, MD is cardiologist. Last visit 06/27/18.  He ordered  a cardiac MRI given severe LVH. Possible variant morphology of hypertrophic CM, but study non-diagnostic since gadolinium could not be used given renal disease. He planned to see her back in ~ 6 months to discuss next steps/testing; however, she missed her 11/21/18 appointment.   LABS:She is for labs on the day of surgery.As of 11/14/18, WBC 9.6, H/H 9.9/31.5, PLT 197, Cr 3.95, glucose 133. A1c 8.7 on 09/19/18 (down from 10.7 on 06/01/17).   IMAGES: CTA Chest 11/14/18: IMPRESSION: 1. No demonstrable pulmonary embolus. 2. Dilatation of the main pulmonary outflow tract, a finding indicative of pulmonary arterial hypertension. 3. Ascending thoracic aortic prominence measuring 4.4 x 4.3 cm. No dissection evident. There are foci of aortic atherosclerosis as well as foci of great vessel and coronary artery calcification. There is left ventricular hypertrophy. Recommend annual imaging followup by CTA or MRA. This recommendation follows 2010 ACCF/AHA/AATS/ACR/ASA/SCA/SCAI/SIR/STS/SVM Guidelines for the Diagnosis and Management of Patients with Thoracic Aortic Disease. Circulation. 2010; 121: R604-V409. Aortic aneurysm NOS (ICD10-I71.9) 4. Adenopathy at several sites of uncertain etiology. Neoplastic etiology cannot be excluded. 5. Multiple areas of ground-glass type opacity, concerning for atypical organism pneumonia. Areas of atelectatic change also noted. Mild consolidation in the inferior lingula. 3 mm nodular opacity in the posterior segment right upper lobe noted. No follow-up needed if patient is low-risk. Non-contrast chest CT can be considered in 12 months if patient is high-risk. This recommendation follows the consensus statement: Guidelines for Management of Incidental Pulmonary Nodules Detected on CT Images: From the Fleischner Society 2017; Radiology 2017; 284:228-243. 6. Spleen appears prominent. Note that spleen is incompletely visualized. Splenic prominence as  well as adenopathy potentially could be indicative of underlying lymphoma. Aortic Atherosclerosis (ICD10-I70.0).  (Comparison Chest CTA 10/23/18: Lungs/Pleura: There are patchy ground-glass opacities within the right middle lobe and both lower lobes, more prominent than on prior abdominal CT in the lung bases. Linear atelectasis in the left upper lobe. No definite septal thickening. No confluent airspace disease. Trace atypical emphysema. The trachea and mainstem bronchi are Patent.)   1V PCXR 11/14/18: COMPARISON: 10/25/2018 FINDINGS: No significant change in AP portable examination with bibasilar heterogeneous opacity, possible small layering pleural effusions, cardiomegaly, and large bore right chest multi lumen vascular catheter. There is no new airspace opacity. IMPRESSION: 1. Cardiomegaly. 2. Unchanged bibasilar opacities which may reflect atelectasis or infection/aspiration.   EKG: EKG 11/14/18: Sinus rhythm Supraventricular bigeminy Right bundle branch block Confirmed by Pattricia Boss [phone]) on 11/14/2018 2:22:59 PM - Known history of RBB and PACs   CV: Echo 10/23/18: IMPRESSIONS 1. Left ventricular ejection fraction, by visual estimation, is 55 to 60%. The left ventricle has normal function. Normal left ventricular size. There is moderately increased left ventricular hypertrophy. 2. Global right ventricle has normal systolic function.The right ventricular size is mildly enlarged. No increase in right ventricular wall thickness. 3. Left atrial size was moderately dilated. 4. Right atrial size was mildly dilated. 5. Mild aortic valve annular calcification. 6. The mitral valve is normal in structure. Moderate to severe mitral valve regurgitation. No evidence of mitral stenosis. 7. The tricuspid valve is normal in structure. Tricuspid valve regurgitation was not visualized by color flow Doppler. 8. The aortic valve is normal in structure. Aortic  valve regurgitation was not visualized by color flow Doppler. Mild to moderate aortic valve sclerosis/calcification without any evidence of aortic stenosis. 9. The pulmonic valve was normal in structure. Pulmonic valve regurgitation is mild by color flow Doppler. 10. Moderately elevated pulmonary artery systolic pressure. The estimated  right ventricular systolic pressure is moderately elevated at 40.9 mmHg. 11. The inferior vena cava is dilated in size with <50% respiratory variability, suggesting right atrial pressure of 15 mmHg. (Comparison echo 06/29/17: LVEF 50-55%, grade 1 DD, mild MR, PA peak pressure: 41 mm Hg)    MR Cardiac 07/31/18: IMPRESSION: 1. Normal biventricular chamber size and function. 2. Asymmetric septal hypertrophy, 19 mm at the LV basal anteroseptum and anterior wall, and 15 mm at the LV mid inferoseptum. No significant flow dephasing to suggest LV outflow tract obstruction. No evidence of systolic anterior motion of the mitral valve. These findings in combination could suggest a variant morphology of hypertrophic cardiomyopathy. 3. Gadolinium contrast was not administered due to end stage renal disease. Unable to assess for burden of delayed enhancement in the evaluation of possible hypertrophic cardiomyopathy or other cardiomyopathic process.   CT Coronary with FFR 07/08/17: FINDINGS: Aorta:Normal size. Mild diffuse calcifications. No dissection. Aortic Valve:Trileaflet. No calcifications. Coronary Arteries:Normal coronary origin. Right dominance. -RCAis a large dominant artery that gives rise to PDA and PLVB. There mild to moderate diffuse calcified plaque with maximum stenosis 50-69% in proximal to mid segment. PDA and PLA are poorly visualized secondary to patients size. -Left main is a large artery that gives rise to LAD and LCX arteries. Left main has no plaque. -LADis a large vessel that gives rise to one diagonal artery. There is  diffuse moderate calcified plaque with maximum stenosis of 50-69% in the mid segment. -D1is a medium size artery with mild diffuse calcified plaque and maximum stenosis 25-50% in the proximal portion. -LCX is a medium size non-dominant artery that gives rise to one large OM1 branch. There is only minimal calcified plaque. Other findings: -Normal pulmonary vein drainage into the left atrium. -Normal let atrial appendage without a thrombus. -Severely dilated pulmonary artery measuring 48 mm suggestive of pulmonary hypertension. IMPRESSION: 1. Coronary calcium score of 837. This was 15 percentile for age and sex matched control. 2. Normal coronary origin with right dominance. 3. Diffuse calcified plaque with moderate CAD in the proximal to mid RCA and mid LAD. Additional analysis with CT FFR will be submitted. Aggressive risk factor modification should be initiated. 4. Severely dilated pulmonary artery measuring 48 mm suggestive of pulmonary hypertension. (I do not see the FFR report, but cardiology notes indicate non-obstructive CAD with medication therapy recommended.)   Past Medical History:  Diagnosis Date  . Anemia   . Anxiety   . Arthritis    "knees", "RA"  . CAD (coronary artery disease)    Nonobstructive on CT 2019  . COVID-19 10/2018  . ESRD (end stage renal disease) ([place])    Dialysis TTHSat- 3rd   . Headache(784.0)   . Heart murmur   . High cholesterol   . History of blood transfusion   . Hypertension   . Mitral regurgitation    moderate to severe MR 10/2018 echo  . 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 Pericardial effusion   . Pneumonia    ; 11/16/2018- "touch of pneumonia" - seen in ED- 11/14/2018- on antibiotic  . PVD (peripheral vascular disease) ([place])    Right leg stent in [place].  (No records)  . Restless legs   . Sleep apnea    does not use Cpap  . Type II diabetes mellitus ([place])     Past Surgical History:   Procedure Laterality Date  . AV FISTULA PLACEMENT Left   .  [place]; 1997   x 2  . COLONOSCOPY    . EYE SURGERY Bilateral    cataract surgery  . INSERTION OF DIALYSIS CATHETER Right 09/26/2018   Procedure: INSERTION OF DIALYSIS CATHETER Right Internal Jugular.;  Surgeon: Elam Dutch, MD;  Location: [place];  Service: Vascular;  Laterality: Right;  . LIGATION OF ARTERIOVENOUS  FISTULA Left 09/26/2018   Procedure: LIGATION OF ARTERIOVENOUS  FISTULA LEFT ARM;  Surgeon: Elam Dutch, MD;  Location: [place];  Service: Vascular;  Laterality: Left;  . PERICARDIAL WINDOW  02/2004   for pericardial effusion  . TUBAL LIGATION  05/1995    MEDICATIONS: No current facility-administered medications for this encounter.    Marland Kitchen amLODipine (NORVASC) 2.5 MG tablet  . ascorbic acid (VITAMIN C) 500 MG tablet  . atorvastatin (LIPITOR) 80 MG tablet  . AURYXIA 1 GM 210 MG(Fe) tablet  . azithromycin (ZITHROMAX) 250 MG tablet  . b complex-vitamin c-folic acid (NEPHRO-VITE) 0.8 MG TABS tablet  . benzonatate (TESSALON) 200 MG capsule  . blood glucose meter kit and supplies KIT  . calcitRIOL (ROCALTROL) 0.5 MCG capsule  . CHANTIX STARTING MONTH PAK 0.5 MG X 11 & 1 MG X 42 tablet  . diphenoxylate-atropine (LOMOTIL) 2.5-0.025 MG tablet  . Flaxseed, Linseed, (FLAX SEED OIL PO)  . hydrOXYzine (ATARAX/VISTARIL) 25 MG tablet  . Insulin Lispro Prot & Lispro (HUMALOG MIX 75/25 KWIKPEN) (75-25) 100 UNIT/ML Kwikpen  . metoprolol succinate (TOPROL-XL) 25 MG 24 hr tablet  . Omega-3 1000 MG CAPS  . oxyCODONE (ROXICODONE) 15 MG immediate release tablet  . pantoprazole (PROTONIX) 40 MG tablet  . pramipexole (MIRAPEX) 0.5 MG tablet  . zinc sulfate 220 (50 Zn) MG capsule   . 0.9 %  sodium chloride infusion  . 0.9 %  sodium chloride infusion  . alteplase (CATHFLO ACTIVASE) injection 2 mg  . heparin injection 1,000 Units  . heparin injection 10,900 Units  . heparin injection 10,900 Units  . lidocaine  (PF) (XYLOCAINE) 1 % injection 5 mL  . lidocaine-prilocaine (EMLA) cream 1 application  . pentafluoroprop-tetrafluoroeth (GEBAUERS) aerosol 1 application     Myra Gianotti, PA-C Surgical Short Stay/Anesthesiology [facility] Phone [phone] [facility] Phone [phone] 12/14/2018 1:48 PM

## 2018-12-15 ENCOUNTER — Encounter (HOSPITAL_COMMUNITY)
Admission: RE | Disposition: A | Discharge status: Home / Self Care | Payer: Self-pay | Source: Home / Self Care | Attending: Vascular Surgery

## 2018-12-15 ENCOUNTER — Other Ambulatory Visit: Payer: Self-pay | Admitting: Orthopedic Surgery

## 2018-12-15 ENCOUNTER — Telehealth: Payer: Self-pay | Admitting: *Deleted

## 2018-12-15 ENCOUNTER — Ambulatory Visit (HOSPITAL_COMMUNITY)
Admission: RE | Admit: 2018-12-15 | Discharge: 2018-12-15 | Disposition: A | Discharge status: Home / Self Care | Payer: Medicare Other | Attending: Vascular Surgery | Admitting: Vascular Surgery

## 2018-12-15 ENCOUNTER — Encounter (HOSPITAL_COMMUNITY): Payer: Self-pay | Admitting: *Deleted

## 2018-12-15 ENCOUNTER — Other Ambulatory Visit: Payer: Self-pay

## 2018-12-15 DIAGNOSIS — Z992 Dependence on renal dialysis: Secondary | ICD-10-CM | POA: Insufficient documentation

## 2018-12-15 DIAGNOSIS — N186 End stage renal disease: Secondary | ICD-10-CM | POA: Insufficient documentation

## 2018-12-15 DIAGNOSIS — Z538 Procedure and treatment not carried out for other reasons: Secondary | ICD-10-CM | POA: Diagnosis not present

## 2018-12-15 DIAGNOSIS — E1122 Type 2 diabetes mellitus with diabetic chronic kidney disease: Secondary | ICD-10-CM | POA: Diagnosis not present

## 2018-12-15 DIAGNOSIS — L089 Local infection of the skin and subcutaneous tissue, unspecified: Secondary | ICD-10-CM | POA: Insufficient documentation

## 2018-12-15 DIAGNOSIS — I120 Hypertensive chronic kidney disease with stage 5 chronic kidney disease or end stage renal disease: Secondary | ICD-10-CM | POA: Insufficient documentation

## 2018-12-15 LAB — POCT I-STAT, CHEM 8
BUN: 38 mg/dL — ABNORMAL HIGH (ref 6–20)
Calcium, Ion: 1.03 mmol/L — ABNORMAL LOW (ref 1.15–1.40)
Chloride: 101 mmol/L (ref 98–111)
Creatinine, Ser: 5.6 mg/dL — ABNORMAL HIGH (ref 0.44–1.00)
Glucose, Bld: 224 mg/dL — ABNORMAL HIGH (ref 70–99)
HCT: 35 % — ABNORMAL LOW (ref 36.0–46.0)
Hemoglobin: 11.9 g/dL — ABNORMAL LOW (ref 12.0–15.0)
Potassium: 4.4 mmol/L (ref 3.5–5.1)
Sodium: 136 mmol/L (ref 135–145)
TCO2: 26 mmol/L (ref 22–32)

## 2018-12-15 LAB — GLUCOSE, CAPILLARY: Glucose-Capillary: 224 mg/dL — ABNORMAL HIGH (ref 70–99)

## 2018-12-15 SURGERY — INSERTION OF ARTERIOVENOUS (AV) GORE-TEX GRAFT ARM
Anesthesia: General | Laterality: Right

## 2018-12-15 NOTE — Telephone Encounter (Signed)
I s/w pt and she has aware to be sure to keep her 12/20/18 2 pm appt with Dr. Percival Spanish for her surgery clearance. I will forward clearance information to Dr. Percival Spanish for appt. I will remove from the pre op call back pool. I will also forward to Dr. Charlotte Crumb surgeon for upcoming surgery on 12/27/18.

## 2018-12-15 NOTE — H&P (Signed)
[facility] SHORT STAY H&P   HPI: Pt with failed left arm Access for steal.  No adequate vein for fistula.  HD day T Th Sat.  Scheduled for partial finger amputation next week.  Past Medical History:  Diagnosis Date  . Anemia   . Anxiety   . Arthritis    "knees", "RA"  . CAD (coronary artery disease)    Nonobstructive on CT 2019  . COVID-19 10/2018  . ESRD (end stage renal disease) ([place])    Dialysis TTHSat- 3rd   . Headache(784.0)   . Heart murmur   . High cholesterol   . History of blood transfusion   . Hypertension   . Mitral regurgitation    moderate to severe MR 10/2018 echo  . 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 Pericardial effusion   . Pneumonia    ; 11/16/2018- "touch of pneumonia" - seen in ED- 11/14/2018- on antibiotic  . PVD (peripheral vascular disease) ([place])    Right leg stent in [place].  (No records)  . Restless legs   . Sleep apnea    does not use Cpap  . Type II diabetes mellitus ([facility])     FH:  Non-Contributory  Social HX Social History   Tobacco Use  . Smoking status: Current Every Day Smoker    Packs/day: 0.50    Years: 33.00    Pack years: 16.50    Types: Cigarettes  . Smokeless tobacco: Never Used  Substance Use Topics  . Alcohol use: No  . Drug use: No    Allergies No Known Allergies  Medications No current facility-administered medications for this encounter.    Facility-Administered Medications Ordered in Other Encounters  Medication Dose Route Frequency Provider Last Rate Last Dose  . 0.9 %  sodium chloride infusion  100 mL Intravenous PRN Ejigiri, Thomos Lemons, PA-C      . 0.9 %  sodium chloride infusion  100 mL Intravenous PRN Ejigiri, Thomos Lemons, PA-C      . alteplase (CATHFLO ACTIVASE) injection 2 mg  2 mg Intracatheter Once PRN Lynnda Child, PA-C      . heparin injection 1,000 Units  1,000 Units Dialysis PRN Lynnda Child, PA-C      . heparin injection 10,900  Units  100 Units/kg Dialysis PRN Lynnda Child, PA-C      . heparin injection 10,900 Units  100 Units/kg Dialysis PRN Lynnda Child, PA-C      . lidocaine (PF) (XYLOCAINE) 1 % injection 5 mL  5 mL Intradermal PRN Lynnda Child, PA-C      . lidocaine-prilocaine (EMLA) cream 1 application  1 application Topical PRN Ejigiri, Thomos Lemons, PA-C      . pentafluoroprop-tetrafluoroeth (GEBAUERS) aerosol 1 application  1 application Topical PRN Lynnda Child, PA-C          PHYSICAL EXAM  Vitals:   12/15/18 0711  BP: (!) 144/99  Pulse: 71  Resp: 18  Temp: 97.9 F (36.6 C)  SpO2: 98%    General:  WDWN in NAD HENT: WNL Eyes: Pupils equal Pulmonary: normal non-labored breathing , without Rales, rhonchi,  wheezing Cardiac: RRR, Vascular Exam/Pulses:  2+ right radial pulse, blister and ulcer right 3rd digit Neuro A&O x 3; good sensation; motion in all extremities  Impression: Active wound right 3rd finger at risk for worsening with graft          Plan: Will  cancel for today Need finger healed prior to proceeding with graft   Ruta Hinds @TODAY @ 7:53 AM

## 2018-12-15 NOTE — Telephone Encounter (Signed)
   [facility] Pre-operative Risk Assessment    Request for surgical clearance:  1. What type of surgery is being performed? LEFT THUMB REVISION   2. When is this surgery scheduled? 12/27/18   3. What type of clearance is required (medical clearance vs. Pharmacy clearance to hold med vs. Both)? MEDICAL  4. Are there any medications that need to be held prior to surgery and how long?  NONE LISTED  5. Practice name and name of physician performing surgery? [facility]; DR. Charlotte Crumb  6. What is your office phone number [phone]    7.   What is your office fax number [phone]  8.   Anesthesia type (None, local, MAC, general) ? GENERAL    Julaine Hua 12/15/2018, 3:13 PM  _________________________________________________________________   (provider comments below)

## 2018-12-15 NOTE — Progress Notes (Signed)
Surgery cancelled due to pending surgery needed on infected finger.  IV removed.  Assisted with getting dressed.  Family called to make aware, all belongings packed, discharged via [facility] at main entrance home.

## 2018-12-15 NOTE — Telephone Encounter (Signed)
   Primary Cardiologist: Minus Breeding, MD  Chart reviewed as part of pre-operative protocol coverage. Patient was contacted 12/15/2018 in reference to pre-operative risk assessment for pending surgery as outlined below.  [name] was last seen on 06/2018 via telemedicine. Cardiac MRI was planned which was done 07/2018 with some features possibly suggesting a variant of hypertrophic cardiomyopathy. She no-showed for tele-med visit in 11/2018 but has a rescheduled in-person office visit with Dr. Percival Spanish in just a few days on 12/20/18. Would recommend she keep this appointment at which time pre-op clearance can also be addressed. I added preop clearance to the appointment notes. Per office protocol, the provider should assess clearance at time of office visit and should forward their finalized clearance decision to requesting party below. I will remove this message from the pre-op box. I will fax this update to the requesting party. I will also route to callback staff to let patient know to please discuss preop clearance with Dr. Percival Spanish at her [place] on 11/18 since she missed her 11/2018 tele-med visit.  Charlie Pitter, PA-C 12/15/2018, 3:56 PM

## 2018-12-19 DIAGNOSIS — Z01810 Encounter for preprocedural cardiovascular examination: Secondary | ICD-10-CM | POA: Insufficient documentation

## 2018-12-19 NOTE — Progress Notes (Signed)
Cardiology Office Note   Date:  12/20/2018   ID:  [name], DOB 1965/04/30, MRN [id number]  PCP:  Catherine Mariscal, MD  Cardiologist:   Catherine Breeding, MD   Chief Complaint  Patient presents with  . Abnormal CT      History of Present Illness: [name] is a 53 y.o. female who presents for follow up for  LVH.  He has a history of pericardial effusion with a window in 2006.  A search of old records indicate that this was at the time of a diagnosis of pneumonia.   He also has had severe LVH on echo with the last of these in our system in 2019.  She was to have a cardiac MRI.  However, she could not have this because she was too claustrophobic.  Since I last saw her she has been in the hospital and I reviewed these records.  She had respiratory failure in part because of volume issues and she had some hypertensive urgency.  She was treated with diuresis.  I do note that she had a CT.  she was found to have an ascending aorta 4.4 x 4 0.3 cm.  There was no evidence of pulmonary embolism.    I also see that she had 3 indeterminate splenic lesions.  There was also a right renal mass.  She has ischemia of her left thumb and actually is sent here today preop prior to getting that removed next week.  She actually says she is doing well.  She had an echocardiogram during her hospitalization and her ejection fraction was 55 to 60%.  She has moderate to severe mitral regurgitation.  She has mild to moderate aortic valve sclerosis without stenosis.  She says she has been doing well.  She denies any cardiovascular symptoms.  She has not been having any shortness of breath, PND or orthopnea.  She is not been having any palpitations, presyncope or syncope.  She has had no weight gain or edema.    Past Medical History:  Diagnosis Date  . Anemia   . Anxiety   . Arthritis    "knees", "RA"  . CAD (coronary artery disease)    Nonobstructive on CT 2019  . COVID-19 10/2018  . ESRD (end stage renal  disease) ([place])    Dialysis TTHSat- 3rd   . Headache(784.0)   . Heart murmur   . High cholesterol   . History of blood transfusion   . Hypertension   . Mitral regurgitation    moderate to severe MR 10/2018 echo  . 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 Pericardial effusion   . Pneumonia    ; 11/16/2018- "touch of pneumonia" - seen in ED- 11/14/2018- on antibiotic  . PVD (peripheral vascular disease) ([place])    Right leg stent in [place].  (No records)  . Restless legs   . Sleep apnea    does not use Cpap  . Type II diabetes mellitus ([place])     Past Surgical History:  Procedure Laterality Date  . AV FISTULA PLACEMENT Left   . [place]; 1997   x 2  . COLONOSCOPY    . EYE SURGERY Bilateral    cataract surgery  . INSERTION OF DIALYSIS CATHETER Right 09/26/2018   Procedure: INSERTION OF DIALYSIS CATHETER Right Internal Jugular.;  Surgeon: Elam Dutch, MD;  Location: [facility];  Service: Vascular;  Laterality: Right;  .  LIGATION OF ARTERIOVENOUS  FISTULA Left 09/26/2018   Procedure: LIGATION OF ARTERIOVENOUS  FISTULA LEFT ARM;  Surgeon: Elam Dutch, MD;  Location: [facility];  Service: Vascular;  Laterality: Left;  . PERICARDIAL WINDOW  02/2004   for pericardial effusion  . TUBAL LIGATION  05/1995     Current Outpatient Medications  Medication Sig Dispense Refill  . amLODipine (NORVASC) 2.5 MG tablet Take 2.5 mg by mouth daily.     Lorin Picket 1 GM 210 MG(Fe) tablet Take 840 mg by mouth 3 (three) times daily with meals.     Marland Kitchen b complex-vitamin c-folic acid (NEPHRO-VITE) 0.8 MG TABS tablet Take 1 tablet by mouth daily.     . blood glucose meter kit and supplies KIT Dispense based on patient and insurance preference. Use up to four times daily as directed. (FOR ICD-9 250.00, 250.01). 1 each 0  . CHANTIX STARTING MONTH PAK 0.5 MG X 11 & 1 MG X 42 tablet Take 0.5 mg by mouth daily.    . cholestyramine light (PREVALITE) 4 g packet     . Flaxseed,  Linseed, (FLAX SEED OIL PO) Take 1,250 mg by mouth daily.    Marland Kitchen gabapentin (NEURONTIN) 100 MG capsule Take 100 mg by mouth 3 (three) times daily.    . hydrOXYzine (ATARAX/VISTARIL) 25 MG tablet Take 25 mg by mouth daily.   1  . Insulin Lispro Prot & Lispro (HUMALOG MIX 75/25 KWIKPEN) (75-25) 100 UNIT/ML Kwikpen Inject 30 Units into the skin 2 (two) times daily with a meal.     . metoprolol succinate (TOPROL-XL) 25 MG 24 hr tablet Take 4 tablets (100 mg total) by mouth daily.    . Omega-3 1000 MG CAPS Take 1,000 mg by mouth daily.    Marland Kitchen oxyCODONE (ROXICODONE) 15 MG immediate release tablet Take 1 tablet (15 mg total) by mouth daily as needed for pain.  0  . pramipexole (MIRAPEX) 0.5 MG tablet Take 0.5 mg by mouth daily.   1  . traZODone (DESYREL) 50 MG tablet Take 50 mg by mouth at bedtime.    Marland Kitchen zinc sulfate 220 (50 Zn) MG capsule Take 1 capsule (220 mg total) by mouth daily. 14 capsule 0  . ALPRAZolam (XANAX) 0.25 MG tablet Take 30-45 minutes prior to MRI 1 tablet 0  . rosuvastatin (CRESTOR) 40 MG tablet Take 1 tablet (40 mg total) by mouth daily. 90 tablet 3   No current facility-administered medications for this visit.    Facility-Administered Medications Ordered in Other Visits  Medication Dose Route Frequency Provider Last Rate Last Dose  . 0.9 %  sodium chloride infusion  100 mL Intravenous PRN Ejigiri, Thomos Lemons, PA-C      . 0.9 %  sodium chloride infusion  100 mL Intravenous PRN Ejigiri, Thomos Lemons, PA-C      . alteplase (CATHFLO ACTIVASE) injection 2 mg  2 mg Intracatheter Once PRN Lynnda Child, PA-C      . heparin injection 1,000 Units  1,000 Units Dialysis PRN Lynnda Child, PA-C      . heparin injection 10,900 Units  100 Units/kg Dialysis PRN Lynnda Child, PA-C      . heparin injection 10,900 Units  100 Units/kg Dialysis PRN Lynnda Child, PA-C      . lidocaine (PF) (XYLOCAINE) 1 % injection 5 mL  5 mL Intradermal PRN Lynnda Child, PA-C       . lidocaine-prilocaine (EMLA) cream 1 application  1 application Topical PRN  Lynnda Child, PA-C      . pentafluoroprop-tetrafluoroeth (GEBAUERS) aerosol 1 application  1 application Topical PRN Lynnda Child, PA-C        Allergies:   Bupropion    ROS:  Please see the history of present illness.   Otherwise, review of systems are positive for none.   All other systems are reviewed and negative.    PHYSICAL EXAM: VS:  BP (!) 152/76   Pulse 60   Temp (!) 96.8 F (36 C)   Ht 5' 8"  (1.727 m)   Wt 242 lb 8.1 oz (110 kg)   LMP  (LMP Unknown)   SpO2 92%   BMI 36.87 kg/m  , BMI Body mass index is 36.87 kg/m. GENERAL:  Well appearing NECK:  No jugular venous distention, waveform within normal limits, carotid upstroke brisk and symmetric, no bruits, no thyromegaly LUNGS:  Clear to auscultation bilaterally CHEST:  Unremarkable, dialysis catheter in the chest HEART:  PMI not displaced or sustained,S1 and S2 within normal limits, no S3, no S4, no clicks, no rubs, no obvious systolic murmurs ABD:  Flat, positive bowel sounds normal in frequency in pitch, no bruits, no rebound, no guarding, no midline pulsatile mass, no hepatomegaly, no splenomegaly EXT:  2 plus pulses throughout, trace edema, no cyanosis no clubbing., thumb bandaged on the le    EKG:  EKG is ordered today. The ekg ordered today demonstrates sinus rhythm, rate 62, right bundle branch block, first-degree AV block, no acute ST-T wave changes.   Recent Labs: 10/23/2018: B Natriuretic Peptide 2,159.1; Magnesium 2.1; TSH 2.957 11/14/2018: ALT 20; Platelets 197 12/15/2018: BUN 38; Creatinine, Ser 5.60; Hemoglobin 11.9; Potassium 4.4; Sodium 136    Lipid Panel    Component Value Date/Time   CHOL 243 (H) 07/07/2017 2107   CHOL 261 (H) 06/01/2017 0959   TRIG 145 09/20/2018 0440   HDL 30 (L) 07/07/2017 2107   HDL 41 06/01/2017 0959   CHOLHDL 8.1 07/07/2017 2107   VLDL UNABLE TO CALCULATE IF TRIGLYCERIDE  OVER 400 mg/dL 07/07/2017 2107   LDLCALC UNABLE TO CALCULATE IF TRIGLYCERIDE OVER 400 mg/dL 07/07/2017 2107   LDLCALC 147 (H) 06/01/2017 0959   LDLDIRECT 208 (H) 07/23/2011 1135     Lab Results  Component Value Date   HGBA1C 8.7 (H) 09/19/2018    Wt Readings from Last 3 Encounters:  12/20/18 242 lb 8.1 oz (110 kg)  12/15/18 224 lb (101.6 kg)  11/14/18 235 lb 14.3 oz (107 kg)      Other studies Reviewed: Additional studies/ records that were reviewed today include: Extensive review of hospital records. Review of the above records demonstrates:  Please see elsewhere in the note.     ASSESSMENT AND PLAN:  LVH:    I Minna try to get a cardiac MRI and will give her some Ativan prior to the test.  HTN:.The blood pressure is controlled.  No change in therapy.  DM:    A1c was elevated as above.  It is better than it has been.  I will defer to Catherine Mariscal, MD  CAD:   The patient has no new sypmtoms.  No further cardiovascular testing is indicated.  We will continue with aggressive risk reduction and meds as listed.this was nonobstructive previously.   DYSLIPIDEMIA:     LDL is 147.  This is not at target.  I will change her to Crestor 40 mg daily getting rid of the Lipitor.   ASCENDING AORTIC ANEURYSM: Given this  I will follow in 1 year with repeat CTA.  RENAL/SPLENIC MASS: I note from her hospital notes that her nephrologist is aware.  I went through this again with the patient and her family member.  She understands that she needs to ask what the next steps are in evaluating this.  PREOP: The patient would be acceptable risk for the planned procedure.  No further cardiovascular testing is suggested.  Current medicines are reviewed at length with the patient today.  The patient does not have concerns regarding medicines.  The following changes have been made:  no change  Labs/ tests ordered today include: None  Orders Placed This Encounter  Procedures  . MR CARDIAC  MORPHOLOGY W WO CONTRAST  . EKG 12-Lead     Disposition:   FU with me in six months.     Signed, Catherine Breeding, MD  12/20/2018 6:12 PM    [facility]

## 2018-12-19 NOTE — Progress Notes (Signed)
Chart was reviewed with Dr Ambrose Pancoast, due to multiple co-morbidities patient will need to be done at [facility].

## 2018-12-20 ENCOUNTER — Telehealth: Payer: Self-pay

## 2018-12-20 ENCOUNTER — Ambulatory Visit: Payer: Medicare Other | Admitting: Infectious Diseases

## 2018-12-20 ENCOUNTER — Encounter: Payer: Self-pay | Admitting: Cardiology

## 2018-12-20 ENCOUNTER — Ambulatory Visit (INDEPENDENT_AMBULATORY_CARE_PROVIDER_SITE_OTHER): Payer: Medicare Other | Admitting: Cardiology

## 2018-12-20 ENCOUNTER — Other Ambulatory Visit: Payer: Self-pay

## 2018-12-20 VITALS — BP 152/76 | HR 60 | Temp 96.8°F | Ht 68.0 in | Wt 242.5 lb

## 2018-12-20 DIAGNOSIS — I2510 Atherosclerotic heart disease of native coronary artery without angina pectoris: Secondary | ICD-10-CM

## 2018-12-20 DIAGNOSIS — E118 Type 2 diabetes mellitus with unspecified complications: Secondary | ICD-10-CM | POA: Diagnosis not present

## 2018-12-20 DIAGNOSIS — I10 Essential (primary) hypertension: Secondary | ICD-10-CM | POA: Diagnosis not present

## 2018-12-20 DIAGNOSIS — Z01810 Encounter for preprocedural cardiovascular examination: Secondary | ICD-10-CM

## 2018-12-20 DIAGNOSIS — I517 Cardiomegaly: Secondary | ICD-10-CM

## 2018-12-20 MED ORDER — ROSUVASTATIN CALCIUM 40 MG PO TABS: 40.0000 mg | ORAL_TABLET | Freq: Every day | ORAL | 3 refills | Status: DC

## 2018-12-20 MED ORDER — ALPRAZOLAM 0.25 MG PO TABS: ORAL_TABLET | ORAL | 0 refills | Status: DC

## 2018-12-20 NOTE — Patient Instructions (Signed)
Medication Instructions:  Stop taking Lipitor. Start taking 40mg  Crestor Daily. Take 0.25mg  Xanax 30-45 minutes before MRI.  If you need a refill on your cardiac medications before your next appointment, please call your pharmacy.   Lab work: NONE  Testing/Procedures: Your physician has requested that you have a cardiac MRI. Cardiac MRI uses a computer to create images of your heart as its beating, producing both still and moving pictures of your heart and major blood vessels. For further information please visit [email or web address] Please follow the instruction sheet given to you today for more information.   Follow-Up: At [facility], you and your health needs are our priority.  As part of our continuing mission to provide you with exceptional heart care, we have created designated Provider Care Teams.  These Care Teams include your primary Cardiologist (physician) and Advanced Practice Providers (APPs -  Physician Assistants and Nurse Practitioners) who all work together to provide you with the care you need, when you need it. You may see Minus Breeding, MD or one of the following Advanced Practice Providers on your designated Care Team:    Rosaria Ferries, PA-C  Jory Sims, DNP, ANP  Cadence Kathlen Mody, NP   Your physician wants you to follow-up in: 6 months with a PA. You will receive a reminder letter in the mail two months in advance. If you don't receive a letter, please call our office to schedule the follow-up appointment.

## 2018-12-20 NOTE — Telephone Encounter (Signed)
Call pt to confirm appt today - went straight to VM that has not been set up yet.

## 2018-12-22 ENCOUNTER — Emergency Department (HOSPITAL_COMMUNITY)
Admission: EM | Admit: 2018-12-22 | Discharge: 2018-12-22 | Disposition: A | Discharge status: Home / Self Care | Payer: Medicare Other | Attending: Emergency Medicine | Admitting: Emergency Medicine

## 2018-12-22 ENCOUNTER — Emergency Department (HOSPITAL_COMMUNITY): Payer: Medicare Other

## 2018-12-22 ENCOUNTER — Other Ambulatory Visit: Payer: Self-pay

## 2018-12-22 DIAGNOSIS — F17210 Nicotine dependence, cigarettes, uncomplicated: Secondary | ICD-10-CM | POA: Diagnosis not present

## 2018-12-22 DIAGNOSIS — I2510 Atherosclerotic heart disease of native coronary artery without angina pectoris: Secondary | ICD-10-CM | POA: Diagnosis not present

## 2018-12-22 DIAGNOSIS — M25569 Pain in unspecified knee: Secondary | ICD-10-CM | POA: Insufficient documentation

## 2018-12-22 DIAGNOSIS — Y92810 Car as the place of occurrence of the external cause: Secondary | ICD-10-CM | POA: Insufficient documentation

## 2018-12-22 DIAGNOSIS — Y9389 Activity, other specified: Secondary | ICD-10-CM | POA: Insufficient documentation

## 2018-12-22 DIAGNOSIS — Z794 Long term (current) use of insulin: Secondary | ICD-10-CM | POA: Diagnosis not present

## 2018-12-22 DIAGNOSIS — Z79899 Other long term (current) drug therapy: Secondary | ICD-10-CM | POA: Insufficient documentation

## 2018-12-22 DIAGNOSIS — M545 Low back pain, unspecified: Secondary | ICD-10-CM

## 2018-12-22 DIAGNOSIS — I5030 Unspecified diastolic (congestive) heart failure: Secondary | ICD-10-CM | POA: Insufficient documentation

## 2018-12-22 DIAGNOSIS — Y999 Unspecified external cause status: Secondary | ICD-10-CM | POA: Insufficient documentation

## 2018-12-22 DIAGNOSIS — R4182 Altered mental status, unspecified: Secondary | ICD-10-CM | POA: Insufficient documentation

## 2018-12-22 DIAGNOSIS — R600 Localized edema: Secondary | ICD-10-CM | POA: Insufficient documentation

## 2018-12-22 DIAGNOSIS — E1122 Type 2 diabetes mellitus with diabetic chronic kidney disease: Secondary | ICD-10-CM | POA: Diagnosis not present

## 2018-12-22 DIAGNOSIS — W19XXXA Unspecified fall, initial encounter: Secondary | ICD-10-CM | POA: Diagnosis not present

## 2018-12-22 DIAGNOSIS — I132 Hypertensive heart and chronic kidney disease with heart failure and with stage 5 chronic kidney disease, or end stage renal disease: Secondary | ICD-10-CM | POA: Diagnosis not present

## 2018-12-22 DIAGNOSIS — N186 End stage renal disease: Secondary | ICD-10-CM | POA: Insufficient documentation

## 2018-12-22 DIAGNOSIS — Z992 Dependence on renal dialysis: Secondary | ICD-10-CM | POA: Diagnosis not present

## 2018-12-22 DIAGNOSIS — U071 COVID-19: Secondary | ICD-10-CM | POA: Insufficient documentation

## 2018-12-22 LAB — CBG MONITORING, ED: Glucose-Capillary: 98 mg/dL (ref 70–99)

## 2018-12-22 MED ORDER — HYDROMORPHONE HCL 1 MG/ML IJ SOLN
0.5000 mg | Freq: Once | INTRAMUSCULAR | Status: AC
  Administered 2018-12-22: 0.5 mg via INTRAMUSCULAR
  Filled 2018-12-22: qty 1

## 2018-12-22 NOTE — ED Provider Notes (Signed)
[facility] EMERGENCY DEPARTMENT Provider Note   CSN: [id number] Arrival date & time: 12/22/18  1018     History   Chief Complaint Chief Complaint  Patient presents with  . Fall    HPI [name] is a 53 y.o. female.     The history is provided by the patient. No language interpreter was used.  Fall This is a new problem. The current episode started 1 to 2 hours ago. The problem occurs constantly. Nothing aggravates the symptoms. Nothing relieves the symptoms. She has tried nothing for the symptoms. The treatment provided no relief.   Pt reports she has arthritis in her knee.  Pt reports knee gave out on her while she was getting into a car.  Pt reports knee is sore and low back is painful.  Pt reports pain in her knee is the same as usual.  Pt reports she does not think she injured her knee.  Pt reports pain in her low back is new.  Pt took her regular home medication without relief. Pt denies any impact of her head.  Past Medical History:  Diagnosis Date  . Anemia   . Anxiety   . Arthritis    "knees", "RA"  . CAD (coronary artery disease)    Nonobstructive on CT 2019  . COVID-19 10/2018  . ESRD (end stage renal disease) ([place])    Dialysis TTHSat- 3rd   . Headache(784.0)   . Heart murmur   . High cholesterol   . History of blood transfusion   . Hypertension   . Mitral regurgitation    moderate to severe MR 10/2018 echo  . 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 Pericardial effusion   . Pneumonia    ; 11/16/2018- "touch of pneumonia" - seen in ED- 11/14/2018- on antibiotic  . PVD (peripheral vascular disease) ([place])    Right leg stent in [place].  (No records)  . Restless legs   . Sleep apnea    does not use Cpap  . Type II diabetes mellitus [facility])     Patient Active Problem List   Diagnosis Date Noted  . Preop cardiovascular exam 12/19/2018  . Dyslipidemia 11/20/2018  . Shortness of breath 10/23/2018  . Acute  respiratory distress 10/23/2018  . Chronic bilateral pleural effusions 10/23/2018  . Obesity 10/23/2018  . Coronary artery disease 10/23/2018  . Tobacco abuse 10/23/2018  . Volume overload 10/23/2018  . Pneumonia 10/10/2018  . COVID-19 virus detected 10/10/2018  . Acute encephalopathy 10/09/2018  . ESRD (end stage renal disease) ([place]) 09/27/2018  . Acute respiratory failure with hypoxia ([place]) 09/26/2018  . Chronic pain disorder 09/26/2018  . COVID-19 virus infection 09/18/2018  . Acute metabolic encephalopathy 37/29/0211  . Gangrene ([place]) 09/18/2018  . Left arm pain 06/27/2018  . Educated about COVID-19 virus infection 06/27/2018  . Anemia 09/26/2017  . Left hip pain 09/26/2017  . Neck pain 09/26/2017  . Leukocytosis 09/26/2017  . Chest pain 07/07/2017  . Left ventricular hypertrophy 06/17/2017  . ESRD on dialysis ([place]) 05/21/2011  . Diabetic retinopathy 05/21/2011  . Diastolic dysfunction 15/52/0802  . Hyperlipidemia 01/30/2011  . Type II diabetes mellitus with complication, uncontrolled ([place]) 01/29/2011  . Neuropathy 01/29/2011  . Hypertension     Past Surgical History:  Procedure Laterality Date  . AV FISTULA PLACEMENT Left   . [place]; 1997   x 2  . COLONOSCOPY    .  EYE SURGERY Bilateral    cataract surgery  . INSERTION OF DIALYSIS CATHETER Right 09/26/2018   Procedure: INSERTION OF DIALYSIS CATHETER Right Internal Jugular.;  Surgeon: Elam Dutch, MD;  Location: [facility];  Service: Vascular;  Laterality: Right;  . LIGATION OF ARTERIOVENOUS  FISTULA Left 09/26/2018   Procedure: LIGATION OF ARTERIOVENOUS  FISTULA LEFT ARM;  Surgeon: Elam Dutch, MD;  Location: [facility];  Service: Vascular;  Laterality: Left;  . PERICARDIAL WINDOW  02/2004   for pericardial effusion  . TUBAL LIGATION  05/1995     OB History   No obstetric history on file.      Home Medications    Prior to Admission medications   Medication Sig Start Date End Date Taking?  Authorizing Provider  ALPRAZolam Duanne Moron) 0.25 MG tablet Take 30-45 minutes prior to MRI 12/20/18   Minus Breeding, MD  amLODipine (NORVASC) 2.5 MG tablet Take 2.5 mg by mouth daily.  11/02/18   [provider]  AURYXIA 1 GM 210 MG(Fe) tablet Take 840 mg by mouth 3 (three) times daily with meals.  05/29/18   [provider]  b complex-vitamin c-folic acid (NEPHRO-VITE) 0.8 MG TABS tablet Take 1 tablet by mouth daily.  07/17/18   [provider]  blood glucose meter kit and supplies KIT Dispense based on patient and insurance preference. Use up to four times daily as directed. (FOR ICD-9 250.00, 250.01). 10/25/18   Hongalgi, Lenis Dickinson, MD  CHANTIX STARTING MONTH PAK 0.5 MG X 11 & 1 MG X 42 tablet Take 0.5 mg by mouth daily. 11/13/18   [provider]  cholestyramine light (PREVALITE) 4 g packet  08/30/18   [provider]  Flaxseed, Linseed, (FLAX SEED OIL PO) Take 1,250 mg by mouth daily.    [provider]  gabapentin (NEURONTIN) 100 MG capsule Take 100 mg by mouth 3 (three) times daily. 11/30/18   [provider]  hydrOXYzine (ATARAX/VISTARIL) 25 MG tablet Take 25 mg by mouth daily.  05/12/17   [provider]  Insulin Lispro Prot & Lispro (HUMALOG MIX 75/25 KWIKPEN) (75-25) 100 UNIT/ML Kwikpen Inject 30 Units into the skin 2 (two) times daily with a meal.     [provider]  metoprolol succinate (TOPROL-XL) 25 MG 24 hr tablet Take 4 tablets (100 mg total) by mouth daily. 10/25/18   Hongalgi, Lenis Dickinson, MD  Omega-3 1000 MG CAPS Take 1,000 mg by mouth daily.    [provider]  oxyCODONE (ROXICODONE) 15 MG immediate release tablet Take 1 tablet (15 mg total) by mouth daily as needed for pain. 09/20/18   Mariel Aloe, MD  pramipexole (MIRAPEX) 0.5 MG tablet Take 0.5 mg by mouth daily.  05/18/17   [provider]  rosuvastatin (CRESTOR) 40 MG tablet Take 1 tablet (40 mg total) by mouth daily. 12/20/18 03/20/19   Minus Breeding, MD  traZODone (DESYREL) 50 MG tablet Take 50 mg by mouth at bedtime. 12/09/18   [provider]  zinc sulfate 220 (50 Zn) MG capsule Take 1 capsule (220 mg total) by mouth daily. 09/21/18   Mariel Aloe, MD    Family History Family History  Problem Relation Age of Onset  . Cancer Brother   . Heart disease Father        Died age 36  . Diabetes Father   . Hyperlipidemia Father   . Hypertension Father   . Stroke Father   . Diabetes Mother   .  Hypertension Mother   . Stroke Mother   . Dementia Mother   . Diabetes Sister   . Diabetes Brother   . Hypertension Sister   . Hypertension Brother     Social History Social History   Tobacco Use  . Smoking status: Current Every Day Smoker    Packs/day: 0.50    Years: 33.00    Pack years: 16.50    Types: Cigarettes  . Smokeless tobacco: Never Used  Substance Use Topics  . Alcohol use: No  . Drug use: No     Allergies   Bupropion   Review of Systems Review of Systems   Physical Exam Updated Vital Signs BP (!) 141/85 (BP Location: Right Arm)   Pulse 60   Resp 18   LMP  (LMP Unknown)   SpO2 95%   Physical Exam Vitals signs and nursing note reviewed.  Constitutional:      Appearance: She is well-developed.  HENT:     Head: Normocephalic.     Nose: Nose normal.  Eyes:     Pupils: Pupils are equal, round, and reactive to light.  Neck:     Musculoskeletal: Normal range of motion.  Cardiovascular:     Rate and Rhythm: Normal rate.  Pulmonary:     Effort: Pulmonary effort is normal.  Abdominal:     General: Abdomen is flat. There is no distension.  Musculoskeletal:        General: Swelling present.     Comments: Tender right flank and low back,  Pain with movement,  Right knee no pain to palpation,  Pain with movement nv and ns intact   Neurological:     General: No focal deficit present.     Mental Status: She is alert and oriented to person, place, and time.  Psychiatric:         Mood and Affect: Mood normal.      ED Treatments / Results  Labs (all labs ordered are listed, but only abnormal results are displayed) Labs Reviewed  CBG MONITORING, ED    EKG None  Radiology Dg Lumbar Spine Complete  Result Date: 12/22/2018 CLINICAL DATA:  Twisted back as was going in her car yesterday ,felt sharp pain lat RTT side down to rt hip,,still hurting today can/t move very well EXAM: LUMBAR SPINE - COMPLETE 4+ VIEW COMPARISON:  CT 10/13/2018 FINDINGS: negative for fracture. Normal alignment. Multilevel spondylitic change with narrowing of interspaces most marked L3-4. Aortic calcifications without suggestion of aneurysm. Calcified uterine fibroid. IMPRESSION: Negative for fracture or other acute bone abnormality. Electronically Signed   By: Lucrezia Europe M.D.   On: 12/22/2018 12:38    Procedures Procedures (including critical care time)  Medications Ordered in ED Medications - No data to display   Initial Impression / Assessment and Plan / ED Course  I have reviewed the triage vital signs and the nursing notes.  Pertinent labs & imaging results that were available during my care of the patient were reviewed by me and considered in my medical decision making (see chart for details).  Clinical Course as of Dec 22 1242  Fri Dec 22, 2018  1200 I walked passed patient's room and patient is slumped over in the wheelchair.  She is difficult to arouse with verbal stimuli requiring sternal rub.  Placed on monitor with O2 saturation 94%.  She appears very drowsy and altered.  Nursing at bedside as well, she will be moved to more acute care area of the ED  for proper management.   [JR]    Clinical Course User Index [JR] Robinson, [place] N, PA-C       Pt given dilaudid 0.5 im here.  Pt advised to continue her home pain medications   Final Clinical Impressions(s) / ED Diagnoses   Final diagnoses:  Fall, initial encounter  Acute low back pain without sciatica, unspecified  back pain laterality    ED Discharge Orders    None    An After Visit Summary was printed and given to the patient.    Fransico Meadow, PA-C 12/22/18 [place], MD 12/24/18 [phone]

## 2018-12-22 NOTE — ED Triage Notes (Signed)
Pt arrives via EMS from home with fall yesterday while getting out of car onto pavement. C/o right lower back pain. 2 person assist. Normally walks with a walker. ALert, oriented x4. VSS.

## 2018-12-22 NOTE — Discharge Instructions (Signed)
Return if any problems.  Take your medication as directed

## 2018-12-22 NOTE — ED Notes (Signed)
[facility] called for transport.  

## 2018-12-22 NOTE — ED Notes (Signed)
This pt along with other staff witnessed pt slumped in wheelchair in room. Pt very difficult to arouse. Staff alerted provider and charge RN. Pt blood sugar was assessed and it was within normal range. Pt placed on monitor.

## 2018-12-25 ENCOUNTER — Encounter (HOSPITAL_COMMUNITY): Payer: Self-pay | Admitting: *Deleted

## 2018-12-25 NOTE — Progress Notes (Signed)
Ms [name] denies chest pain or shortness of breath. Ms [name] has type II diabetes, patient reports that the last A1C was 8.7. Patient did not check CBG this am.  I encouraged Ms [name] to check CBG 3 -4 times a day to see if there is something she is eating that is elevating CBG. I renforced the importance of keeping CBG under 180 to decrease chance of infection and increase healing. I encouraged patient to take 21 units of 75/25 Insulin Tuesday evening. I instructed patient to check CBG after awaking and every 2 hours until arrival  to the hospital.  I Instructed patient if CBG is less than 70 to drink 1/2 cup of a clear juice. Recheck CBG in 15 minutes then call pre- op desk at [phone] for further instructions.

## 2018-12-27 ENCOUNTER — Encounter (HOSPITAL_COMMUNITY): Payer: Self-pay

## 2018-12-27 ENCOUNTER — Ambulatory Visit (HOSPITAL_COMMUNITY): Payer: Medicare Other | Admitting: Certified Registered Nurse Anesthetist

## 2018-12-27 ENCOUNTER — Ambulatory Visit (HOSPITAL_COMMUNITY)
Admission: RE | Admit: 2018-12-27 | Discharge: 2018-12-27 | Disposition: A | Discharge status: Home / Self Care | Payer: Medicare Other | Attending: Orthopedic Surgery | Admitting: Orthopedic Surgery

## 2018-12-27 ENCOUNTER — Encounter (HOSPITAL_COMMUNITY)
Admission: RE | Disposition: A | Discharge status: Home / Self Care | Payer: Self-pay | Source: Home / Self Care | Attending: Orthopedic Surgery

## 2018-12-27 ENCOUNTER — Other Ambulatory Visit: Payer: Self-pay

## 2018-12-27 DIAGNOSIS — Z992 Dependence on renal dialysis: Secondary | ICD-10-CM | POA: Diagnosis not present

## 2018-12-27 DIAGNOSIS — E1152 Type 2 diabetes mellitus with diabetic peripheral angiopathy with gangrene: Secondary | ICD-10-CM | POA: Insufficient documentation

## 2018-12-27 DIAGNOSIS — E7800 Pure hypercholesterolemia, unspecified: Secondary | ICD-10-CM | POA: Insufficient documentation

## 2018-12-27 DIAGNOSIS — F419 Anxiety disorder, unspecified: Secondary | ICD-10-CM | POA: Insufficient documentation

## 2018-12-27 DIAGNOSIS — E1169 Type 2 diabetes mellitus with other specified complication: Secondary | ICD-10-CM | POA: Insufficient documentation

## 2018-12-27 DIAGNOSIS — M869 Osteomyelitis, unspecified: Secondary | ICD-10-CM | POA: Insufficient documentation

## 2018-12-27 DIAGNOSIS — N186 End stage renal disease: Secondary | ICD-10-CM | POA: Diagnosis not present

## 2018-12-27 DIAGNOSIS — I96 Gangrene, not elsewhere classified: Secondary | ICD-10-CM | POA: Diagnosis not present

## 2018-12-27 DIAGNOSIS — Z20828 Contact with and (suspected) exposure to other viral communicable diseases: Secondary | ICD-10-CM | POA: Insufficient documentation

## 2018-12-27 DIAGNOSIS — G4730 Sleep apnea, unspecified: Secondary | ICD-10-CM | POA: Diagnosis not present

## 2018-12-27 DIAGNOSIS — E1122 Type 2 diabetes mellitus with diabetic chronic kidney disease: Secondary | ICD-10-CM | POA: Diagnosis not present

## 2018-12-27 DIAGNOSIS — Z95820 Peripheral vascular angioplasty status with implants and grafts: Secondary | ICD-10-CM | POA: Insufficient documentation

## 2018-12-27 DIAGNOSIS — I2510 Atherosclerotic heart disease of native coronary artery without angina pectoris: Secondary | ICD-10-CM | POA: Insufficient documentation

## 2018-12-27 DIAGNOSIS — I120 Hypertensive chronic kidney disease with stage 5 chronic kidney disease or end stage renal disease: Secondary | ICD-10-CM | POA: Insufficient documentation

## 2018-12-27 DIAGNOSIS — F17210 Nicotine dependence, cigarettes, uncomplicated: Secondary | ICD-10-CM | POA: Diagnosis not present

## 2018-12-27 DIAGNOSIS — Z794 Long term (current) use of insulin: Secondary | ICD-10-CM | POA: Diagnosis not present

## 2018-12-27 DIAGNOSIS — Z79899 Other long term (current) drug therapy: Secondary | ICD-10-CM | POA: Diagnosis not present

## 2018-12-27 HISTORY — DX: Dyspnea, unspecified: R06.00

## 2018-12-27 HISTORY — PX: AMPUTATION: SHX166

## 2018-12-27 LAB — POCT I-STAT, CHEM 8
BUN: 36 mg/dL — ABNORMAL HIGH (ref 6–20)
Calcium, Ion: 0.97 mmol/L — ABNORMAL LOW (ref 1.15–1.40)
Chloride: 98 mmol/L (ref 98–111)
Creatinine, Ser: 4.9 mg/dL — ABNORMAL HIGH (ref 0.44–1.00)
Glucose, Bld: 158 mg/dL — ABNORMAL HIGH (ref 70–99)
HCT: 35 % — ABNORMAL LOW (ref 36.0–46.0)
Hemoglobin: 11.9 g/dL — ABNORMAL LOW (ref 12.0–15.0)
Potassium: 4.7 mmol/L (ref 3.5–5.1)
Sodium: 136 mmol/L (ref 135–145)
TCO2: 27 mmol/L (ref 22–32)

## 2018-12-27 LAB — GLUCOSE, CAPILLARY
Glucose-Capillary: 164 mg/dL — ABNORMAL HIGH (ref 70–99)
Glucose-Capillary: 139 mg/dL — ABNORMAL HIGH (ref 70–99)

## 2018-12-27 LAB — SARS CORONAVIRUS 2 BY RT PCR (HOSPITAL ORDER, PERFORMED IN ~~LOC~~ HOSPITAL LAB): SARS Coronavirus 2: NEGATIVE

## 2018-12-27 SURGERY — AMPUTATION DIGIT
Anesthesia: General | Laterality: Left

## 2018-12-27 MED ORDER — OXYCODONE-ACETAMINOPHEN 5-325 MG PO TABS: 1.0000 | ORAL_TABLET | ORAL | 0 refills | Status: DC | PRN

## 2018-12-27 MED ORDER — POVIDONE-IODINE 10 % EX SWAB: 2.0000 "application " | Freq: Once | CUTANEOUS | Status: DC

## 2018-12-27 MED ORDER — CHLORHEXIDINE GLUCONATE 4 % EX LIQD: 60.0000 mL | Freq: Once | CUTANEOUS | Status: DC

## 2018-12-27 MED ORDER — DEXAMETHASONE SODIUM PHOSPHATE 10 MG/ML IJ SOLN
INTRAMUSCULAR | Status: DC | PRN
  Administered 2018-12-27: 5 mg via INTRAVENOUS

## 2018-12-27 MED ORDER — FENTANYL CITRATE (PF) 250 MCG/5ML IJ SOLN
INTRAMUSCULAR | Status: AC
  Filled 2018-12-27: qty 5

## 2018-12-27 MED ORDER — LIDOCAINE 2% (20 MG/ML) 5 ML SYRINGE
INTRAMUSCULAR | Status: DC | PRN
  Administered 2018-12-27: 60 mg via INTRAVENOUS

## 2018-12-27 MED ORDER — BUPIVACAINE HCL (PF) 0.25 % IJ SOLN
INTRAMUSCULAR | Status: AC
  Filled 2018-12-27: qty 30

## 2018-12-27 MED ORDER — PHENYLEPHRINE HCL-NACL 10-0.9 MG/250ML-% IV SOLN
INTRAVENOUS | Status: DC | PRN
  Administered 2018-12-27: 60 ug/min via INTRAVENOUS

## 2018-12-27 MED ORDER — BUPIVACAINE HCL (PF) 0.25 % IJ SOLN
INTRAMUSCULAR | Status: DC | PRN
  Administered 2018-12-27: 4 mL

## 2018-12-27 MED ORDER — LIDOCAINE 2% (20 MG/ML) 5 ML SYRINGE: INTRAMUSCULAR | Status: DC | PRN

## 2018-12-27 MED ORDER — SUCCINYLCHOLINE CHLORIDE 20 MG/ML IJ SOLN
INTRAMUSCULAR | Status: DC | PRN
  Administered 2018-12-27: 100 mg via INTRAVENOUS

## 2018-12-27 MED ORDER — CEFAZOLIN SODIUM-DEXTROSE 2-4 GM/100ML-% IV SOLN
2.0000 g | INTRAVENOUS | Status: AC
  Administered 2018-12-27: 2 g via INTRAVENOUS
  Filled 2018-12-27: qty 100

## 2018-12-27 MED ORDER — SODIUM CHLORIDE 0.9 % IV SOLN
INTRAVENOUS | Status: DC
  Administered 2018-12-27: 10:00:00 via INTRAVENOUS

## 2018-12-27 MED ORDER — 0.9 % SODIUM CHLORIDE (POUR BTL) OPTIME
TOPICAL | Status: DC | PRN
  Administered 2018-12-27: 13:00:00 1000 mL

## 2018-12-27 MED ORDER — MIDAZOLAM HCL 2 MG/2ML IJ SOLN
INTRAMUSCULAR | Status: AC
  Filled 2018-12-27: qty 2

## 2018-12-27 MED ORDER — MIDAZOLAM HCL 5 MG/5ML IJ SOLN
INTRAMUSCULAR | Status: DC | PRN
  Administered 2018-12-27: 2 mg via INTRAVENOUS

## 2018-12-27 MED ORDER — PROPOFOL 10 MG/ML IV BOLUS
INTRAVENOUS | Status: DC | PRN
  Administered 2018-12-27: 100 mg via INTRAVENOUS
  Administered 2018-12-27: 70 mg via INTRAVENOUS

## 2018-12-27 MED ORDER — FENTANYL CITRATE (PF) 100 MCG/2ML IJ SOLN: 25.0000 ug | INTRAMUSCULAR | Status: DC | PRN

## 2018-12-27 MED ORDER — PHENYLEPHRINE 40 MCG/ML (10ML) SYRINGE FOR IV PUSH (FOR BLOOD PRESSURE SUPPORT)
PREFILLED_SYRINGE | INTRAVENOUS | Status: DC | PRN
  Administered 2018-12-27: 80 ug via INTRAVENOUS
  Administered 2018-12-27 (×3): 120 ug via INTRAVENOUS

## 2018-12-27 MED ORDER — FENTANYL CITRATE (PF) 100 MCG/2ML IJ SOLN
INTRAMUSCULAR | Status: DC | PRN
  Administered 2018-12-27 (×2): 25 ug via INTRAVENOUS

## 2018-12-27 MED ORDER — ONDANSETRON HCL 4 MG/2ML IJ SOLN
INTRAMUSCULAR | Status: AC
  Filled 2018-12-27: qty 2

## 2018-12-27 MED ORDER — ONDANSETRON HCL 4 MG/2ML IJ SOLN
INTRAMUSCULAR | Status: DC | PRN
  Administered 2018-12-27 (×2): 4 mg via INTRAVENOUS

## 2018-12-27 MED ORDER — PROPOFOL 10 MG/ML IV BOLUS
INTRAVENOUS | Status: AC
  Filled 2018-12-27: qty 20

## 2018-12-27 SURGICAL SUPPLY — 55 items
BLADE 15 SAFETY STRL DISP (BLADE) ×1 IMPLANT
BNDG CMPR 9X4 STRL LF SNTH (GAUZE/BANDAGES/DRESSINGS) ×1
BNDG COHESIVE 1X5 TAN STRL LF (GAUZE/BANDAGES/DRESSINGS) IMPLANT
BNDG CONFORM 2 STRL LF (GAUZE/BANDAGES/DRESSINGS) ×1 IMPLANT
BNDG ELASTIC 3X5.8 VLCR STR LF (GAUZE/BANDAGES/DRESSINGS) ×1 IMPLANT
BNDG ESMARK 4X9 LF (GAUZE/BANDAGES/DRESSINGS) ×2 IMPLANT
BNDG GAUZE ELAST 4 BULKY (GAUZE/BANDAGES/DRESSINGS) IMPLANT
CABLE BIPOLOR RESECTION CORD (MISCELLANEOUS) ×1 IMPLANT
CONT SPEC STER OR (MISCELLANEOUS) ×1 IMPLANT
CORD BIPOLAR FORCEPS 12FT (ELECTRODE) ×2 IMPLANT
COVER SURGICAL LIGHT HANDLE (MISCELLANEOUS) ×1 IMPLANT
COVER WAND RF STERILE (DRAPES) ×2 IMPLANT
CUFF TOURN SGL QUICK 18X4 (TOURNIQUET CUFF) ×1 IMPLANT
CUFF TOURN SGL QUICK 24 (TOURNIQUET CUFF)
CUFF TRNQT CYL 24X4X16.5-23 (TOURNIQUET CUFF) IMPLANT
DRAPE OEC MINIVIEW 54X84 (DRAPES) IMPLANT
DRAPE SURG 17X23 STRL (DRAPES) ×2 IMPLANT
DRSG XEROFORM 1X8 (GAUZE/BANDAGES/DRESSINGS) ×1 IMPLANT
DURAPREP 26ML APPLICATOR (WOUND CARE) ×2 IMPLANT
GAUZE SPONGE 2X2 8PLY STRL LF (GAUZE/BANDAGES/DRESSINGS) IMPLANT
GAUZE SPONGE 4X4 12PLY STRL (GAUZE/BANDAGES/DRESSINGS) ×2 IMPLANT
GAUZE XEROFORM 1X8 LF (GAUZE/BANDAGES/DRESSINGS) ×1 IMPLANT
GLOVE ECLIPSE 7.0 STRL STRAW (GLOVE) ×1 IMPLANT
GLOVE SURG SS PI 7.5 STRL IVOR (GLOVE) ×2 IMPLANT
GLOVE SURG SYN 8.0 (GLOVE) ×2 IMPLANT
GLOVE SURG SYN 8.0 PF PI (GLOVE) ×1 IMPLANT
GOWN STRL REUS W/ TWL LRG LVL3 (GOWN DISPOSABLE) ×1 IMPLANT
GOWN STRL REUS W/ TWL XL LVL3 (GOWN DISPOSABLE) ×1 IMPLANT
GOWN STRL REUS W/TWL LRG LVL3 (GOWN DISPOSABLE) ×2
GOWN STRL REUS W/TWL XL LVL3 (GOWN DISPOSABLE) ×2
KIT BASIN OR (CUSTOM PROCEDURE TRAY) ×2 IMPLANT
KIT TURNOVER KIT B (KITS) ×2 IMPLANT
MANIFOLD NEPTUNE II (INSTRUMENTS) ×1 IMPLANT
NDL HYPO 25GX1X1/2 BEV (NEEDLE) IMPLANT
NEEDLE HYPO 25GX1X1/2 BEV (NEEDLE) IMPLANT
NS IRRIG 1000ML POUR BTL (IV SOLUTION) ×2 IMPLANT
PACK ORTHO EXTREMITY (CUSTOM PROCEDURE TRAY) ×2 IMPLANT
PAD ARMBOARD 7.5X6 YLW CONV (MISCELLANEOUS) ×2 IMPLANT
PAD CAST 3X4 CTTN HI CHSV (CAST SUPPLIES) IMPLANT
PADDING CAST COTTON 3X4 STRL (CAST SUPPLIES) ×2
SPECIMEN JAR SMALL (MISCELLANEOUS) ×2 IMPLANT
SPONGE GAUZE 2X2 STER 10/PKG (GAUZE/BANDAGES/DRESSINGS)
STOCKINETTE IMPERVIOUS LG (DRAPES) ×1 IMPLANT
STRIP CLOSURE SKIN 1/2X4 (GAUZE/BANDAGES/DRESSINGS) IMPLANT
SUCTION FRAZIER HANDLE 10FR (MISCELLANEOUS)
SUCTION TUBE FRAZIER 10FR DISP (MISCELLANEOUS) IMPLANT
SUT MNCRL AB 4-0 PS2 18 (SUTURE) ×1 IMPLANT
SWAB CULTURE LIQ STUART DBL (MISCELLANEOUS) ×1 IMPLANT
SWAB CULTURE LIQUID MINI MALE (MISCELLANEOUS) ×1 IMPLANT
SYR CONTROL 10ML LL (SYRINGE) ×1 IMPLANT
TOWEL GREEN STERILE (TOWEL DISPOSABLE) ×2 IMPLANT
TOWEL GREEN STERILE FF (TOWEL DISPOSABLE) ×2 IMPLANT
TUBE CONNECTING 12X1/4 (SUCTIONS) ×1 IMPLANT
UNDERPAD 30X30 (UNDERPADS AND DIAPERS) ×2 IMPLANT
WATER STERILE IRR 1000ML POUR (IV SOLUTION) ×2 IMPLANT

## 2018-12-27 NOTE — Anesthesia Preprocedure Evaluation (Signed)
Anesthesia Evaluation  Patient identified by MRN, date of birth, ID band Patient awake    Reviewed: Allergy & Precautions, NPO status , Patient's Chart, lab work & pertinent test results  Airway Mallampati: II  TM Distance: >3 FB     Dental   Pulmonary shortness of breath, sleep apnea , pneumonia, Current Smoker and Patient abstained from smoking.,    breath sounds clear to auscultation       Cardiovascular hypertension, + CAD and + Peripheral Vascular Disease  + Valvular Problems/Murmurs  Rhythm:Regular Rate:Normal     Neuro/Psych  Headaches, Anxiety    GI/Hepatic   Endo/Other  diabetes  Renal/GU Renal disease     Musculoskeletal  (+) Arthritis ,   Abdominal   Peds  Hematology  (+) anemia ,   Anesthesia Other Findings   Reproductive/Obstetrics                             Anesthesia Physical Anesthesia Plan  ASA: III  Anesthesia Plan: General   Post-op Pain Management:    Induction: Intravenous  PONV Risk Score and Plan: 2 and Ondansetron, Dexamethasone and Midazolam  Airway Management Planned: LMA  Additional Equipment:   Intra-op Plan:   Post-operative Plan:   Informed Consent: I have reviewed the patients History and Physical, chart, labs and discussed the procedure including the risks, benefits and alternatives for the proposed anesthesia with the patient or authorized representative who has indicated his/her understanding and acceptance.     Dental advisory given  Plan Discussed with: CRNA and Anesthesiologist  Anesthesia Plan Comments:         Anesthesia Quick Evaluation

## 2018-12-27 NOTE — Transfer of Care (Signed)
Immediate Anesthesia Transfer of Care Note  Patient: [name]  Procedure(s) Performed: LEFT THUMB REVISION AMPUTATION DIGIT (Left )  Patient Location: PACU  Anesthesia Type:General  Level of Consciousness: drowsy  Airway & Oxygen Therapy: Patient Spontanous Breathing and Patient connected to nasal cannula oxygen  Post-op Assessment: Report given to RN and Post -op Vital signs reviewed and stable  Post vital signs: Reviewed and stable  Last Vitals:  Vitals Value Taken Time  BP 138/82 12/27/18 1400  Temp 36.2 C 12/27/18 1400  Pulse 76 12/27/18 1412  Resp 19 12/27/18 1412  SpO2 95 % 12/27/18 1412  Vitals shown include unvalidated device data.  Last Pain:  Vitals:   12/27/18 0959  TempSrc:   PainSc: 8          Complications: No apparent anesthesia complications

## 2018-12-27 NOTE — Anesthesia Procedure Notes (Signed)
Procedure Name: Intubation Date/Time: 12/27/2018 12:56 PM Performed by: Meilin Brosh T, CRNA Pre-anesthesia Checklist: Patient identified, Emergency Drugs available, Suction available and Patient being monitored Patient Re-evaluated:Patient Re-evaluated prior to induction Oxygen Delivery Method: Circle system utilized Preoxygenation: Pre-oxygenation with 100% oxygen Induction Type: IV induction Ventilation: Mask ventilation without difficulty Laryngoscope Size: Mac and 3 Grade View: Grade I Tube type: Oral Tube size: 7.5 mm Number of attempts: 1 Airway Equipment and Method: Patient positioned with wedge pillow and Stylet Placement Confirmation: ETT inserted through vocal cords under direct vision,  positive ETCO2 and breath sounds checked- equal and bilateral Secured at: 21 cm Tube secured with: Tape Dental Injury: Teeth and Oropharynx as per pre-operative assessment

## 2018-12-27 NOTE — Anesthesia Postprocedure Evaluation (Signed)
Anesthesia Post Note  Patient: [name]  Procedure(s) Performed: LEFT THUMB REVISION AMPUTATION DIGIT (Left )     Patient location during evaluation: PACU Anesthesia Type: General Level of consciousness: awake Pain management: pain level controlled Vital Signs Assessment: post-procedure vital signs reviewed and stable Respiratory status: spontaneous breathing Cardiovascular status: stable Postop Assessment: no apparent nausea or vomiting Anesthetic complications: no    Last Vitals:  Vitals:   12/27/18 1445 12/27/18 1500  BP: 133/78 116/71  Pulse: 72 71  Resp: 16 18  Temp:    SpO2: 96% (!) 87%    Last Pain:  Vitals:   12/27/18 1500  TempSrc:   PainSc: 0-No pain                 Elizebeth Kluesner

## 2018-12-27 NOTE — Op Note (Signed)
Patient was taken to the operating suite and after induction of adequate general anesthetic the left upper extremity was prepped and draped in the usual sterile fashion.  An Esmarch was used to exsanguinate the limb and the tourniquet was inflated to 250 mmHg.  At this point time the left thumb was approached surgically with mid lateral incisions from the proximal phalanx base to the tip fishmouth incision was made and the performed a fillet flap harvesting of the thumb with excision of the nail matrix and the nail distally and culturing of the soft tissues and bone to the level of the base of the proximal phalanx.  We maintain the neurovascular bundles in the volar part of the fillet flap.  We then thoroughly irrigated and advanced the volar flap dorsally to close the wound at the base of the proximal phalanx.  This is done with 4-0 Monocryl suture.  We then dressed with Xeroform, 4 x 4's, and a compressive bandage.  The patient tolerated this procedure well went to recovery room in stable fashion.

## 2018-12-27 NOTE — Brief Op Note (Signed)
12/27/2018  1:33 PM  PATIENT:  [name]  53 y.o. female  PRE-OPERATIVE DIAGNOSIS:  left thumb chronic ischemia  POST-OPERATIVE DIAGNOSIS:  left thumb chronic ischemia  PROCEDURE:  Procedure(s): LEFT THUMB REVISION AMPUTATION DIGIT (Left)  SURGEON:  Surgeon(s) and Role:    Charlotte Crumb, MD - Primary  PHYSICIAN ASSISTANT:   ASSISTANTS: none   ANESTHESIA:   general  EBL: Minimal  BLOOD ADMINISTERED:none  DRAINS: none   LOCAL MEDICATIONS USED:  MARCAINE     SPECIMEN:  Biopsy / Limited Resection  DISPOSITION OF SPECIMEN:  PATHOLOGY  COUNTS:  YES  TOURNIQUET:   Total Tourniquet Time Documented: Arm  (Left) - 26 minutes Total: Arm  (Left) - 26 minutes   DICTATION: .Viviann Spare Dictation  PLAN OF CARE: Discharge to home after PACU  PATIENT DISPOSITION:  PACU - hemodynamically stable.   Delay start of Pharmacological VTE agent (>24hrs) due to surgical blood loss or risk of bleeding: not applicable

## 2018-12-27 NOTE — H&P (Signed)
[name] is an 53 y.o. female.   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  Past Medical History:  Diagnosis Date  . Anemia   . Anxiety   . Arthritis    "knees" "hands", "RA"  . CAD (coronary artery disease)    Nonobstructive on CT 2019  . COVID-19 10/2018  . Dyspnea    "when I have too much fluid."  . ESRD (end stage renal disease) ([place])    Dialysis TTHSat- 3rd   . Headache(784.0)   . Heart murmur   . High cholesterol   . History of blood transfusion   . Hypertension   . Mitral regurgitation    moderate to severe MR 10/2018 echo  . 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 Pericardial effusion   . Pneumonia    ; 11/16/2018- "touch of pneumonia" - seen in ED- 11/14/2018- on antibiotic  . PVD (peripheral vascular disease) ([place])    Right leg stent in [place].  (No records)  . Restless legs   . Sleep apnea    does not use Cpap  . Type II diabetes mellitus ([place])     Past Surgical History:  Procedure Laterality Date  . AV FISTULA PLACEMENT Left   . [place]; 1997   x 2  . COLONOSCOPY    . EYE SURGERY Bilateral    cataract surgery  . INSERTION OF DIALYSIS CATHETER Right 09/26/2018   Procedure: INSERTION OF DIALYSIS CATHETER Right Internal Jugular.;  Surgeon: Elam Dutch, MD;  Location: [facility];  Service: Vascular;  Laterality: Right;  . LIGATION OF ARTERIOVENOUS  FISTULA Left 09/26/2018   Procedure: LIGATION OF ARTERIOVENOUS  FISTULA LEFT ARM;  Surgeon: Elam Dutch, MD;  Location: [facility];  Service: Vascular;  Laterality: Left;  . PERICARDIAL WINDOW  02/2004   for pericardial effusion  . TUBAL LIGATION  05/1995    Family History  Problem Relation Age of Onset  . Cancer Brother   . Heart disease Father        Died age 93  . Diabetes  Father   . Hyperlipidemia Father   . Hypertension Father   . Stroke Father   . Diabetes Mother   . Hypertension Mother   . Stroke Mother   . Dementia Mother   . Diabetes Sister   . Diabetes Brother   . Hypertension Sister   . Hypertension Brother    Social History:  reports that she has been smoking cigarettes. She has a 0.99 pack-year smoking history. She has never used smokeless tobacco. She reports that she does not drink alcohol or use drugs.  Allergies:  Allergies  Allergen Reactions  . Bupropion Itching    Medications Prior to Admission  Medication Sig Dispense Refill  . amLODipine (NORVASC) 5 MG tablet Take 5 mg by mouth daily.     . Ascorbic Acid (VITAMIN C) 100 MG tablet Take 100 mg by mouth daily.    Marland Kitchen atorvastatin (LIPITOR) 80 MG tablet Take 80 mg by mouth daily.    Lorin Picket 1 GM 210 MG(Fe) tablet Take 840 mg by mouth 3 (three) times daily with meals.     . CHANTIX STARTING MONTH PAK 0.5 MG X 11 & 1 MG X 42 tablet Take 0.5 mg by mouth 2 (two)  times daily.     . hydrOXYzine (ATARAX/VISTARIL) 25 MG tablet Take 25 mg by mouth every 12 (twelve) hours as needed for anxiety or itching.   1  . Insulin Lispro Prot & Lispro (HUMALOG MIX 75/25 KWIKPEN) (75-25) 100 UNIT/ML Kwikpen Inject 30 Units into the skin 2 (two) times daily with a meal.     . metoprolol succinate (TOPROL-XL) 100 MG 24 hr tablet Take 100 mg by mouth daily. Take with or immediately following a meal.    . Omega-3 1000 MG CAPS Take 1,000 mg by mouth daily.    . Oxycodone HCl 10 MG TABS Take 10 mg by mouth 3 (three) times daily as needed.    . pantoprazole (PROTONIX) 40 MG tablet Take 40 mg by mouth daily.    . pramipexole (MIRAPEX) 0.5 MG tablet Take 0.5 mg by mouth daily.   1  . traZODone (DESYREL) 50 MG tablet Take 50 mg by mouth at bedtime.    Marland Kitchen zinc sulfate 220 (50 Zn) MG capsule Take 1 capsule (220 mg total) by mouth daily. 14 capsule 0  . ALPRAZolam (XANAX) 0.25 MG tablet Take 30-45 minutes prior to MRI  (Patient not taking: Reported on 12/25/2018) 1 tablet 0  . blood glucose meter kit and supplies KIT Dispense based on patient and insurance preference. Use up to four times daily as directed. (FOR ICD-9 250.00, 250.01). 1 each 0  . metoprolol succinate (TOPROL-XL) 25 MG 24 hr tablet Take 4 tablets (100 mg total) by mouth daily. (Patient not taking: Reported on 12/25/2018)    . oxyCODONE (ROXICODONE) 15 MG immediate release tablet Take 1 tablet (15 mg total) by mouth daily as needed for pain. (Patient not taking: Reported on 12/25/2018)  0  . rosuvastatin (CRESTOR) 40 MG tablet Take 1 tablet (40 mg total) by mouth daily. (Patient not taking: Reported on 12/25/2018) 90 tablet 3    Results for orders placed or performed during the hospital encounter of 12/27/18 (from the past 48 hour(s))  Glucose, capillary     Status: Abnormal   Collection Time: 12/27/18  9:37 AM  Result Value Ref Range   Glucose-Capillary 164 (H) 70 - 99 mg/dL  I-STAT, chem 8     Status: Abnormal   Collection Time: 12/27/18 10:31 AM  Result Value Ref Range   Sodium 136 135 - 145 mmol/L   Potassium 4.7 3.5 - 5.1 mmol/L   Chloride 98 98 - 111 mmol/L   BUN 36 (H) 6 - 20 mg/dL   Creatinine, Ser 4.90 (H) 0.44 - 1.00 mg/dL   Glucose, Bld 158 (H) 70 - 99 mg/dL   Calcium, Ion 0.97 (L) 1.15 - 1.40 mmol/L   TCO2 27 22 - 32 mmol/L   Hemoglobin 11.9 (L) 12.0 - 15.0 g/dL   HCT 35.0 (L) 36.0 - 46.0 %  SARS Coronavirus 2 by RT PCR (hospital order, performed in [facility] hospital lab) Nasopharyngeal Nasopharyngeal Swab     Status: None   Collection Time: 12/27/18 10:50 AM   Specimen: Nasopharyngeal Swab  Result Value Ref Range   SARS Coronavirus 2 NEGATIVE NEGATIVE    Comment: (NOTE) SARS-CoV-2 target nucleic acids are NOT DETECTED. The SARS-CoV-2 RNA is generally detectable in upper and lower respiratory specimens during the acute phase of infection. The lowest concentration of SARS-CoV-2 viral copies this assay can detect is  250 copies / mL. A negative result does not preclude SARS-CoV-2 infection and should not be used as the sole basis for treatment  or other patient management decisions.  A negative result may occur with improper specimen collection / handling, submission of specimen other than nasopharyngeal swab, presence of viral mutation(s) within the areas targeted by this assay, and inadequate number of viral copies (<250 copies / mL). A negative result must be combined with clinical observations, patient history, and epidemiological information. Fact Sheet for Patients:   StrictlyIdeas.no Fact Sheet for Healthcare Providers: BankingDealers.co.za This test is not yet approved or cleared  by the [place] FDA and has been authorized for detection and/or diagnosis of SARS-CoV-2 by FDA under an Emergency Use Authorization (EUA).  This EUA will remain in effect (meaning this test can be used) for the duration of the COVID-19 declaration under Section 564(b)(1) of the Act, 21 U.S.C. section 360bbb-3(b)(1), unless the authorization is terminated or revoked sooner. Performed at [facility], [place] [address] [place], [place] [address]    No results found.  Review of Systems  All other systems reviewed and are negative.   Blood pressure (!) 148/71, pulse 77, temperature 98.1 F (36.7 C), temperature source Oral, resp. rate 20, height 5' 8"  (1.727 m), weight 103.4 kg, SpO2 99 %. Physical Exam  Constitutional: She is oriented to person, place, and time. She appears well-developed and well-nourished.  HENT:  Head: Normocephalic and atraumatic.  Neck: Normal range of motion.  Cardiovascular: Normal rate.  Respiratory: Effort normal.  Musculoskeletal:     Left hand: She exhibits tenderness, bony tenderness, deformity and swelling.     Comments: Chronic left gangrene with osteomyelitis of distal and proximal phalanges  Neurological: She is alert  and oriented to person, place, and time.  Skin: Skin is warm.  Psychiatric: She has a normal mood and affect. Her behavior is normal. Judgment and thought content normal.     Assessment/Plan 53 year old female with chronic gangrene and osteomyelitis of left thumb.  Have discussed the role of incision and drainage and skeletal shortening to a level of healthy tissue.  Patient understands a significant loss of function secondary to this procedure and wishes to proceed.  Schuyler Amor, MD 12/27/2018, 12:22 PM

## 2018-12-28 ENCOUNTER — Encounter (HOSPITAL_COMMUNITY): Payer: Self-pay | Admitting: Orthopedic Surgery

## 2018-12-29 LAB — AEROBIC CULTURE W GRAM STAIN (SUPERFICIAL SPECIMEN)

## 2018-12-29 LAB — SURGICAL PATHOLOGY

## 2018-12-31 LAB — ANAEROBIC CULTURE

## 2019-01-05 ENCOUNTER — Encounter: Payer: Self-pay | Admitting: Cardiology

## 2019-01-05 ENCOUNTER — Telehealth: Payer: Self-pay | Admitting: *Deleted

## 2019-01-05 NOTE — Telephone Encounter (Signed)
Spoke with patient regarding appointment for Cardiac MRI scheduled Wednesday 01/31/19 at 8:00 am at [facility]---arrival time 7:15 am 1st floor admissions office --will mail information to patient.

## 2019-01-11 ENCOUNTER — Other Ambulatory Visit: Payer: Self-pay

## 2019-01-11 ENCOUNTER — Ambulatory Visit (INDEPENDENT_AMBULATORY_CARE_PROVIDER_SITE_OTHER): Payer: Medicare Other | Admitting: Vascular Surgery

## 2019-01-11 ENCOUNTER — Encounter: Payer: Self-pay | Admitting: Vascular Surgery

## 2019-01-11 ENCOUNTER — Ambulatory Visit (HOSPITAL_COMMUNITY)
Admission: RE | Admit: 2019-01-11 | Discharge: 2019-01-11 | Disposition: A | Discharge status: Home / Self Care | Payer: Medicare Other | Source: Ambulatory Visit | Attending: Vascular Surgery | Admitting: Vascular Surgery

## 2019-01-11 VITALS — BP 120/72 | HR 70 | Temp 97.9°F | Resp 20 | Ht 68.0 in | Wt 229.3 lb

## 2019-01-11 DIAGNOSIS — I2510 Atherosclerotic heart disease of native coronary artery without angina pectoris: Secondary | ICD-10-CM

## 2019-01-11 DIAGNOSIS — Z992 Dependence on renal dialysis: Secondary | ICD-10-CM | POA: Diagnosis not present

## 2019-01-11 DIAGNOSIS — T82898A Other specified complication of vascular prosthetic devices, implants and grafts, initial encounter: Secondary | ICD-10-CM | POA: Insufficient documentation

## 2019-01-11 DIAGNOSIS — N186 End stage renal disease: Secondary | ICD-10-CM | POA: Diagnosis not present

## 2019-01-11 NOTE — Progress Notes (Addendum)
Patient is a 53 year old female who returns for follow-up today.  We have been trying to place a long-term hemodialysis access in her.  She previously had a left arm AV fistula ligated for steal.  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  This was done about 2 weeks ago.  It is slowly healing.  She was recently scheduled for a right arm access but showed up to the preop holding area with a new wound and ulcer on her right third digit.  We decided to cancel the operation and reschedule her after the finger has healed.  She returns today and the wound on her right third finger is slowly healing but still present.  She does not have any tenderness in the finger.  She currently is dialyzing with a catheter.  Past Medical History:  Diagnosis Date  . Anemia   . Anxiety   . Arthritis    "knees" "hands", "RA"  . CAD (coronary artery disease)    Nonobstructive on CT 2019  . COVID-19 10/2018  . Dyspnea    "when I have too much fluid."  . ESRD (end stage renal disease) ([place])    Dialysis TTHSat- 3rd   . Headache(784.0)   . Heart murmur   . High cholesterol   . History of blood transfusion   . Hypertension   . Mitral regurgitation    moderate to severe MR 10/2018 echo  . 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 Pericardial effusion   . Pneumonia    ; 11/16/2018- "touch of pneumonia" - seen in ED- 11/14/2018- on antibiotic  . PVD (peripheral vascular disease) ([place])    Right leg stent in [place].  (No records)  . Restless legs   . Sleep apnea    does not use Cpap  . Type II diabetes mellitus ([place])     Past Surgical History:  Procedure Laterality Date  . AMPUTATION Left 12/27/2018   Procedure: LEFT THUMB REVISION AMPUTATION DIGIT;  Surgeon: Charlotte Crumb, MD;  Location: [facility];  Service: Orthopedics;  Laterality: Left;  . AV FISTULA PLACEMENT Left   . [place]; 1997   x 2  . COLONOSCOPY    . EYE SURGERY Bilateral    cataract  surgery  . INSERTION OF DIALYSIS CATHETER Right 09/26/2018   Procedure: INSERTION OF DIALYSIS CATHETER Right Internal Jugular.;  Surgeon: Elam Dutch, MD;  Location: [facility];  Service: Vascular;  Laterality: Right;  . LIGATION OF ARTERIOVENOUS  FISTULA Left 09/26/2018   Procedure: LIGATION OF ARTERIOVENOUS  FISTULA LEFT ARM;  Surgeon: Elam Dutch, MD;  Location: [name];  Service: Vascular;  Laterality: Left;  . PERICARDIAL WINDOW  02/2004   for pericardial effusion  . TUBAL LIGATION  05/1995    Current Outpatient Medications on File Prior to Visit  Medication Sig Dispense Refill  . amLODipine (NORVASC) 5 MG tablet Take 5 mg by mouth daily.     . Ascorbic Acid (VITAMIN C) 100 MG tablet Take 100 mg by mouth daily.    Lorin Picket 1 GM 210 MG(Fe) tablet Take 840 mg by mouth 3 (three) times daily with meals.     . blood glucose meter kit and supplies KIT Dispense based on patient and insurance preference. Use up to four times daily as directed. (FOR ICD-9 250.00, 250.01). 1 each 0  . CHANTIX STARTING MONTH PAK 0.5 MG X 11 & 1 MG X  42 tablet Take 0.5 mg by mouth 2 (two) times daily.     . hydrOXYzine (ATARAX/VISTARIL) 25 MG tablet Take 25 mg by mouth every 12 (twelve) hours as needed for anxiety or itching.   1  . Insulin Lispro Prot & Lispro (HUMALOG MIX 75/25 KWIKPEN) (75-25) 100 UNIT/ML Kwikpen Inject 30 Units into the skin 2 (two) times daily with a meal.     . metoprolol succinate (TOPROL-XL) 100 MG 24 hr tablet Take 100 mg by mouth daily. Take with or immediately following a meal.    . Omega-3 1000 MG CAPS Take 1,000 mg by mouth daily.    Marland Kitchen oxyCODONE (ROXICODONE) 15 MG immediate release tablet Take 1 tablet (15 mg total) by mouth daily as needed for pain.  0  . Oxycodone HCl 10 MG TABS Take 10 mg by mouth 3 (three) times daily as needed.    Marland Kitchen oxyCODONE-acetaminophen (PERCOCET) 5-325 MG tablet Take 1 tablet by mouth every 4 (four) hours as needed for severe pain. 20 tablet 0  . pantoprazole  (PROTONIX) 40 MG tablet Take 40 mg by mouth daily.    . pramipexole (MIRAPEX) 0.5 MG tablet Take 0.5 mg by mouth daily.   1  . rosuvastatin (CRESTOR) 40 MG tablet Take 1 tablet (40 mg total) by mouth daily. 90 tablet 3  . traZODone (DESYREL) 50 MG tablet Take 50 mg by mouth at bedtime.    Marland Kitchen zinc sulfate 220 (50 Zn) MG capsule Take 1 capsule (220 mg total) by mouth daily. 14 capsule 0  . gabapentin (NEURONTIN) 100 MG capsule Take 100 mg by mouth 3 (three) times daily.     Current Facility-Administered Medications on File Prior to Visit  Medication Dose Route Frequency Provider Last Rate Last Admin  . 0.9 %  sodium chloride infusion  100 mL Intravenous PRN Lynnda Child, PA-C   New Bag at 12/27/18 1240  . 0.9 %  sodium chloride infusion  100 mL Intravenous PRN Lynnda Child, PA-C      . alteplase (CATHFLO ACTIVASE) injection 2 mg  2 mg Intracatheter Once PRN Lynnda Child, PA-C      . heparin injection 1,000 Units  1,000 Units Dialysis PRN Lynnda Child, PA-C      . heparin injection 10,900 Units  100 Units/kg Dialysis PRN Lynnda Child, PA-C      . heparin injection 10,900 Units  100 Units/kg Dialysis PRN Lynnda Child, PA-C      . lidocaine (PF) (XYLOCAINE) 1 % injection 5 mL  5 mL Intradermal PRN Lynnda Child, PA-C      . lidocaine-prilocaine (EMLA) cream 1 application  1 application Topical PRN Ejigiri, Thomos Lemons, PA-C      . pentafluoroprop-tetrafluoroeth (GEBAUERS) aerosol 1 application  1 application Topical PRN Lynnda Child, PA-C        Social History   Socioeconomic History  . Marital status: Married    Spouse name: Not on file  . Number of children: Not on file  . Years of education: Not on file  . Highest education level: Not on file  Occupational History  . Not on file  Tobacco Use  . Smoking status: Current Every Day Smoker    Packs/day: 0.25    Years: 33.00    Pack years: 8.25    Types: Cigarettes  .  Smokeless tobacco: Never Used  Substance and Sexual Activity  . Alcohol use: No  . Drug use: No  .  Sexual activity: Not Currently    Birth control/protection: Post-menopausal  Other Topics Concern  . Not on file  Social History Narrative   Lives with son.        Social Determinants of Health   Financial Resource Strain:   . Difficulty of Paying Living Expenses: Not on file  Food Insecurity:   . Worried About Charity fundraiser in the Last Year: Not on file  . Ran Out of Food in the Last Year: Not on file  Transportation Needs:   . Lack of Transportation (Medical): Not on file  . Lack of Transportation (Non-Medical): Not on file  Physical Activity:   . Days of Exercise per Week: Not on file  . Minutes of Exercise per Session: Not on file  Stress:   . Feeling of Stress : Not on file  Social Connections:   . Frequency of Communication with Friends and Family: Not on file  . Frequency of Social Gatherings with Friends and Family: Not on file  . Attends Religious Services: Not on file  . Active Member of Clubs or Organizations: Not on file  . Attends Archivist Meetings: Not on file  . Marital Status: Not on file  Intimate Partner Violence:   . Fear of Current or Ex-Partner: Not on file  . Emotionally Abused: Not on file  . Physically Abused: Not on file  . Sexually Abused: Not on file     Review of systems: She has no shortness of breath.  She has no chest pain.  Physical exam:  Vitals:   01/11/19 1418  BP: 120/72  Pulse: 70  Resp: 20  Temp: 97.9 F (36.6 C)  SpO2: 97%  Weight: 229 lb 4.5 oz (104 kg)  Height: 5' 8"  (1.727 m)    Left upper extremity: Slowly healing left thumb amputation site with slight separation.    Right upper extremity: Distal portion of right third finger has a 4 mm ulceration with dry eschar on it that appears to be healing.  Vascular: She has 2+ palpable right radial pulse.  Data: She had bilateral ABIs performed today.   Right side was 0.90 left side was 0.83  Assessment: Slowly healing wound right third finger.  Patient with previous steal and eventual amputation of the left digit.  She will be high risk on the right side as well.  I discussed her today the possibility that she could end up with amputation of another digit if she develops a severe steal.  In light of this she wishes to wait until the ulcer on her right hand has completely healed to give it the best chance of healing overall.  Plan: The patient will follow up with me in 6 weeks time again to reconsider access in the right arm if all wounds on the right hand are completely healed.  Her ABIs indicate mild arterial occlusive disease.  If we end up with a thigh graft she may have some potential for steal in the legs as well.  Ruta Hinds, MD [facility] Office: [phone]

## 2019-01-30 ENCOUNTER — Telehealth (HOSPITAL_COMMUNITY): Payer: Self-pay | Admitting: Emergency Medicine

## 2019-01-30 NOTE — Telephone Encounter (Signed)
phone off, no VM box to leave message

## 2019-01-31 ENCOUNTER — Ambulatory Visit (HOSPITAL_COMMUNITY): Admission: RE | Admit: 2019-01-31 | Payer: Medicare Other | Source: Ambulatory Visit

## 2019-02-16 ENCOUNTER — Other Ambulatory Visit: Payer: Self-pay

## 2019-02-16 MED ORDER — ROSUVASTATIN CALCIUM 40 MG PO TABS: 40.0000 mg | ORAL_TABLET | Freq: Every day | ORAL | 1 refills | Status: DC

## 2019-02-16 NOTE — Telephone Encounter (Signed)
Pt of Dr. Percival Spanish, requesting refill for Rosuvastatin 40 mg. Thank you.

## 2019-02-21 ENCOUNTER — Other Ambulatory Visit: Payer: Self-pay | Admitting: Cardiology

## 2019-02-21 NOTE — Telephone Encounter (Signed)
New Message      *STAT* If patient is at the pharmacy, call can be transferred to refill team.   1. Which medications need to be refilled? (please list name of each medication and dose if known) rosuvastatin (CRESTOR) 40 MG tablet  2. Which pharmacy/location (including street and city if local pharmacy) is medication to be sent to? [facility] in [place] [phone]  3. Do they need a 30 day or 90 day supply? [place]

## 2019-02-22 ENCOUNTER — Ambulatory Visit: Payer: Medicare Other | Admitting: Vascular Surgery

## 2019-02-23 MED ORDER — ROSUVASTATIN CALCIUM 40 MG PO TABS: 40.0000 mg | ORAL_TABLET | Freq: Every day | ORAL | 1 refills | Status: DC

## 2019-02-23 NOTE — Telephone Encounter (Signed)
Rx(s) sent to pharmacy electronically.  

## 2019-02-28 ENCOUNTER — Telehealth (HOSPITAL_COMMUNITY): Payer: Self-pay

## 2019-02-28 NOTE — Telephone Encounter (Signed)

## 2019-03-01 ENCOUNTER — Encounter: Payer: Self-pay | Admitting: Vascular Surgery

## 2019-03-01 ENCOUNTER — Other Ambulatory Visit: Payer: Self-pay

## 2019-03-01 ENCOUNTER — Ambulatory Visit (INDEPENDENT_AMBULATORY_CARE_PROVIDER_SITE_OTHER): Payer: Medicare Other | Admitting: Vascular Surgery

## 2019-03-01 VITALS — BP 130/73 | HR 71 | Temp 97.4°F | Resp 20 | Ht 68.0 in | Wt 229.0 lb

## 2019-03-01 DIAGNOSIS — Z992 Dependence on renal dialysis: Secondary | ICD-10-CM

## 2019-03-01 DIAGNOSIS — N186 End stage renal disease: Secondary | ICD-10-CM

## 2019-03-01 NOTE — H&P (View-Only) (Signed)
Patient name: [name] MRN: [id number] DOB: 05-Mar-1965 Sex: female  REASON FOR CONSULT: Hemodialysis access  HPI: [name] is a 54 y.o. female, who previously had ligation of a left arm access for steal.  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  She returns today for further follow-up to make sure that all wounds on her left and right hand have completely healed.  She states the left thumb amputation is completely healed.  All the ulcers on her right hand have also healed.  She currently dialyzes on Tuesday Thursday Saturday.  Reviewed vein mapping showed no significant veins that would be usable for a fistula in the right arm.  Other medical problems include coronary artery disease, elevated cholesterol, hypertension, sleep apnea, diabetes.  All currently stable.  Past Medical History:  Diagnosis Date  . Anemia   . Anxiety   . Arthritis    "knees" "hands", "RA"  . CAD (coronary artery disease)    Nonobstructive on CT 2019  . COVID-19 10/2018  . Dyspnea    "when I have too much fluid."  . ESRD (end stage renal disease) ([place])    Dialysis TTHSat- 3rd   . Headache(784.0)   . Heart murmur   . High cholesterol   . History of blood transfusion   . Hypertension   . Mitral regurgitation    moderate to severe MR 10/2018 echo  . 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 Pericardial effusion   . Pneumonia    ; 11/16/2018- "touch of pneumonia" - seen in ED- 11/14/2018- on antibiotic  . PVD (peripheral vascular disease) ([place])    Right leg stent in [place].  (No records)  . Restless legs   . Sleep apnea    does not use Cpap  . Type II diabetes mellitus ([place])    Past Surgical History:  Procedure Laterality Date  . AMPUTATION Left 12/27/2018   Procedure: LEFT THUMB REVISION AMPUTATION DIGIT;  Surgeon: Charlotte Crumb, MD;  Location: [facility];  Service: Orthopedics;  Laterality: Left;  . AV FISTULA PLACEMENT Left   . [place]; 1997   x 2  . COLONOSCOPY    . EYE SURGERY Bilateral    cataract surgery  . INSERTION OF DIALYSIS CATHETER Right 09/26/2018   Procedure: INSERTION OF DIALYSIS CATHETER Right Internal Jugular.;  Surgeon: Elam Dutch, MD;  Location: [facility];  Service: Vascular;  Laterality: Right;  . LIGATION OF ARTERIOVENOUS  FISTULA Left 09/26/2018   Procedure: LIGATION OF ARTERIOVENOUS  FISTULA LEFT ARM;  Surgeon: Elam Dutch, MD;  Location: [facility];  Service: Vascular;  Laterality: Left;  . PERICARDIAL WINDOW  02/2004   for pericardial effusion  . TUBAL LIGATION  05/1995    Family History  Problem Relation Age of Onset  . Cancer Brother   . Heart disease Father        Died age 39  . Diabetes Father   . Hyperlipidemia Father   . Hypertension Father   . Stroke Father   . Diabetes Mother   . Hypertension Mother   . Stroke Mother   . Dementia Mother   . Diabetes Sister   . Diabetes Brother   . Hypertension Sister   . Hypertension Brother     SOCIAL HISTORY: Social History   Socioeconomic History  . Marital status: Married    Spouse name: Not on file  . Number of  children: Not on file  . Years of education: Not on file  . Highest education level: Not on file  Occupational History  . Not on file  Tobacco Use  . Smoking status: Current Every Day Smoker    Packs/day: 0.25    Years: 33.00    Pack years: 8.25    Types: Cigarettes  . Smokeless tobacco: Never Used  . Tobacco comment: 3-4 cigs daily  Substance and Sexual Activity  . Alcohol use: No  . Drug use: No  . Sexual activity: Not Currently    Birth control/protection: Post-menopausal  Other Topics Concern  . Not on file  Social History Narrative   Lives with son.        Social Determinants of Health   Financial Resource Strain:   . Difficulty of Paying Living Expenses: Not on file  Food Insecurity:   . Worried About Charity fundraiser in the Last Year: Not on file  . Ran Out of Food in the Last  Year: Not on file  Transportation Needs:   . Lack of Transportation (Medical): Not on file  . Lack of Transportation (Non-Medical): Not on file  Physical Activity:   . Days of Exercise per Week: Not on file  . Minutes of Exercise per Session: Not on file  Stress:   . Feeling of Stress : Not on file  Social Connections:   . Frequency of Communication with Friends and Family: Not on file  . Frequency of Social Gatherings with Friends and Family: Not on file  . Attends Religious Services: Not on file  . Active Member of Clubs or Organizations: Not on file  . Attends Archivist Meetings: Not on file  . Marital Status: Not on file  Intimate Partner Violence:   . Fear of Current or Ex-Partner: Not on file  . Emotionally Abused: Not on file  . Physically Abused: Not on file  . Sexually Abused: Not on file    Allergies  Allergen Reactions  . Bupropion Itching    Current Outpatient Medications  Medication Sig Dispense Refill  . amLODipine (NORVASC) 5 MG tablet Take 5 mg by mouth daily.     . Ascorbic Acid (VITAMIN C) 100 MG tablet Take 100 mg by mouth daily.    Lorin Picket 1 GM 210 MG(Fe) tablet Take 840 mg by mouth 3 (three) times daily with meals.     . blood glucose meter kit and supplies KIT Dispense based on patient and insurance preference. Use up to four times daily as directed. (FOR ICD-9 250.00, 250.01). 1 each 0  . CHANTIX STARTING MONTH PAK 0.5 MG X 11 & 1 MG X 42 tablet Take 0.5 mg by mouth 2 (two) times daily.     Marland Kitchen gabapentin (NEURONTIN) 100 MG capsule Take 100 mg by mouth 3 (three) times daily.    . hydrOXYzine (ATARAX/VISTARIL) 25 MG tablet Take 25 mg by mouth every 12 (twelve) hours as needed for anxiety or itching.   1  . Insulin Lispro Prot & Lispro (HUMALOG MIX 75/25 KWIKPEN) (75-25) 100 UNIT/ML Kwikpen Inject 30 Units into the skin 2 (two) times daily with a meal.     . metoprolol succinate (TOPROL-XL) 100 MG 24 hr tablet Take 100 mg by mouth daily. Take with  or immediately following a meal.    . Omega-3 1000 MG CAPS Take 1,000 mg by mouth daily.    Marland Kitchen oxyCODONE (ROXICODONE) 15 MG immediate release tablet Take 1 tablet (15  mg total) by mouth daily as needed for pain.  0  . Oxycodone HCl 10 MG TABS Take 10 mg by mouth 3 (three) times daily as needed.    Marland Kitchen oxyCODONE-acetaminophen (PERCOCET) 5-325 MG tablet Take 1 tablet by mouth every 4 (four) hours as needed for severe pain. 20 tablet 0  . pantoprazole (PROTONIX) 40 MG tablet Take 40 mg by mouth daily.    . pramipexole (MIRAPEX) 0.5 MG tablet Take 0.5 mg by mouth daily.   1  . rosuvastatin (CRESTOR) 40 MG tablet Take 1 tablet (40 mg total) by mouth daily. 90 tablet 1  . traZODone (DESYREL) 50 MG tablet Take 50 mg by mouth at bedtime.    Marland Kitchen zinc sulfate 220 (50 Zn) MG capsule Take 1 capsule (220 mg total) by mouth daily. 14 capsule 0   No current facility-administered medications for this visit.   Facility-Administered Medications Ordered in Other Visits  Medication Dose Route Frequency Provider Last Rate Last Admin  . 0.9 %  sodium chloride infusion  100 mL Intravenous PRN Lynnda Child, PA-C   New Bag at 12/27/18 1240  . 0.9 %  sodium chloride infusion  100 mL Intravenous PRN Lynnda Child, PA-C      . alteplase (CATHFLO ACTIVASE) injection 2 mg  2 mg Intracatheter Once PRN Lynnda Child, PA-C      . heparin injection 1,000 Units  1,000 Units Dialysis PRN Lynnda Child, PA-C      . heparin injection 10,900 Units  100 Units/kg Dialysis PRN Lynnda Child, PA-C      . heparin injection 10,900 Units  100 Units/kg Dialysis PRN Lynnda Child, PA-C      . lidocaine (PF) (XYLOCAINE) 1 % injection 5 mL  5 mL Intradermal PRN Lynnda Child, PA-C      . lidocaine-prilocaine (EMLA) cream 1 application  1 application Topical PRN Ejigiri, Thomos Lemons, PA-C      . pentafluoroprop-tetrafluoroeth (GEBAUERS) aerosol 1 application  1 application Topical PRN  Lynnda Child, PA-C        ROS:   Cardiac: No recent episodes of chest pain/pressure, no shortness of breath at rest.  + shortness of breath with exertion.  Denies history of atrial fibrillation or irregular heartbeat  Pulmonary: No home oxygen, no productive cough, no hemoptysis,  No asthma or wheezing  Urinary: [X]  chronic Kidney disease, [X]  on HD - [ ]  MWF or [X]  TTHS, [ ]  Burning with urination, [ ]  Frequent urination, [ ]  Difficulty urinating;   Skin: No rashes  Psychological: No history of anxiety,  No history of depression   Physical Examination  Vitals:   03/01/19 1106  BP: 130/73  Pulse: 71  Resp: 20  Temp: (!) 97.4 F (36.3 C)  SpO2: 95%  Weight: 229 lb (103.9 kg)  Height: 5' 8"  (1.727 m)    Body mass index is 34.82 kg/m.  General:  Alert and oriented, no acute distress HEENT: Normal Neck: No JVD Cardiac: Regular Rate and Rhythm  Extremity Pulses:  2+ radial, brachial right arm  Musculoskeletal: No deformity or edema, well-healed left thumb amputation  Neurologic: Upper and lower extremity motor 5/5 and symmetric   ASSESSMENT: Patient's left hand is now completely healed.  The ulcers on her right hand are also healed.  She needs long-term hemodialysis access.  Risk benefits possible complications of right upper arm graft were explained to the patient today.  She understands that she would be at  high risk of steal in the right arm and she will be on the look out for symptoms of numbness tingling aching or nonhealing wounds in her right arm.  She will call us if those occur.  Other risks and complications include bleeding graft thrombosis possible need for other procedures infection she understands and agrees to proceed   PLAN: Right upper arm graft scheduled for 03/12/2019   Ruta Hinds, MD [facility]: [phone] Pager: [phone]

## 2019-03-01 NOTE — Progress Notes (Signed)
Patient name: [name] MRN: [id number] DOB: 10-22-1965 Sex: female  REASON FOR CONSULT: Hemodialysis access  HPI: [name] is a 54 y.o. female, who previously had ligation of a left arm access for steal.  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  She returns today for further follow-up to make sure that all wounds on her left and right hand have completely healed.  She states the left thumb amputation is completely healed.  All the ulcers on her right hand have also healed.  She currently dialyzes on Tuesday Thursday Saturday.  Reviewed vein mapping showed no significant veins that would be usable for a fistula in the right arm.  Other medical problems include coronary artery disease, elevated cholesterol, hypertension, sleep apnea, diabetes.  All currently stable.  Past Medical History:  Diagnosis Date  . Anemia   . Anxiety   . Arthritis    "knees" "hands", "RA"  . CAD (coronary artery disease)    Nonobstructive on CT 2019  . COVID-19 10/2018  . Dyspnea    "when I have too much fluid."  . ESRD (end stage renal disease) ([place])    Dialysis TTHSat- 3rd   . Headache(784.0)   . Heart murmur   . High cholesterol   . History of blood transfusion   . Hypertension   . Mitral regurgitation    moderate to severe MR 10/2018 echo  . 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 Pericardial effusion   . Pneumonia    ; 11/16/2018- "touch of pneumonia" - seen in ED- 11/14/2018- on antibiotic  . PVD (peripheral vascular disease) ([place])    Right leg stent in [place].  (No records)  . Restless legs   . Sleep apnea    does not use Cpap  . Type II diabetes mellitus ([place])    Past Surgical History:  Procedure Laterality Date  . AMPUTATION Left 12/27/2018   Procedure: LEFT THUMB REVISION AMPUTATION DIGIT;  Surgeon: Charlotte Crumb, MD;  Location: [facility];  Service: Orthopedics;  Laterality: Left;  . AV FISTULA PLACEMENT Left   . [place]; 1997   x 2  . COLONOSCOPY    . EYE SURGERY Bilateral    cataract surgery  . INSERTION OF DIALYSIS CATHETER Right 09/26/2018   Procedure: INSERTION OF DIALYSIS CATHETER Right Internal Jugular.;  Surgeon: Elam Dutch, MD;  Location: [facility];  Service: Vascular;  Laterality: Right;  . LIGATION OF ARTERIOVENOUS  FISTULA Left 09/26/2018   Procedure: LIGATION OF ARTERIOVENOUS  FISTULA LEFT ARM;  Surgeon: Elam Dutch, MD;  Location: [facility];  Service: Vascular;  Laterality: Left;  . PERICARDIAL WINDOW  02/2004   for pericardial effusion  . TUBAL LIGATION  05/1995    Family History  Problem Relation Age of Onset  . Cancer Brother   . Heart disease Father        Died age 29  . Diabetes Father   . Hyperlipidemia Father   . Hypertension Father   . Stroke Father   . Diabetes Mother   . Hypertension Mother   . Stroke Mother   . Dementia Mother   . Diabetes Sister   . Diabetes Brother   . Hypertension Sister   . Hypertension Brother     SOCIAL HISTORY: Social History   Socioeconomic History  . Marital status: Married    Spouse name: Not on file  . Number of  children: Not on file  . Years of education: Not on file  . Highest education level: Not on file  Occupational History  . Not on file  Tobacco Use  . Smoking status: Current Every Day Smoker    Packs/day: 0.25    Years: 33.00    Pack years: 8.25    Types: Cigarettes  . Smokeless tobacco: Never Used  . Tobacco comment: 3-4 cigs daily  Substance and Sexual Activity  . Alcohol use: No  . Drug use: No  . Sexual activity: Not Currently    Birth control/protection: Post-menopausal  Other Topics Concern  . Not on file  Social History Narrative   Lives with son.        Social Determinants of Health   Financial Resource Strain:   . Difficulty of Paying Living Expenses: Not on file  Food Insecurity:   . Worried About Charity fundraiser in the Last Year: Not on file  . Ran Out of Food in the Last  Year: Not on file  Transportation Needs:   . Lack of Transportation (Medical): Not on file  . Lack of Transportation (Non-Medical): Not on file  Physical Activity:   . Days of Exercise per Week: Not on file  . Minutes of Exercise per Session: Not on file  Stress:   . Feeling of Stress : Not on file  Social Connections:   . Frequency of Communication with Friends and Family: Not on file  . Frequency of Social Gatherings with Friends and Family: Not on file  . Attends Religious Services: Not on file  . Active Member of Clubs or Organizations: Not on file  . Attends Archivist Meetings: Not on file  . Marital Status: Not on file  Intimate Partner Violence:   . Fear of Current or Ex-Partner: Not on file  . Emotionally Abused: Not on file  . Physically Abused: Not on file  . Sexually Abused: Not on file    Allergies  Allergen Reactions  . Bupropion Itching    Current Outpatient Medications  Medication Sig Dispense Refill  . amLODipine (NORVASC) 5 MG tablet Take 5 mg by mouth daily.     . Ascorbic Acid (VITAMIN C) 100 MG tablet Take 100 mg by mouth daily.    Lorin Picket 1 GM 210 MG(Fe) tablet Take 840 mg by mouth 3 (three) times daily with meals.     . blood glucose meter kit and supplies KIT Dispense based on patient and insurance preference. Use up to four times daily as directed. (FOR ICD-9 250.00, 250.01). 1 each 0  . CHANTIX STARTING MONTH PAK 0.5 MG X 11 & 1 MG X 42 tablet Take 0.5 mg by mouth 2 (two) times daily.     Marland Kitchen gabapentin (NEURONTIN) 100 MG capsule Take 100 mg by mouth 3 (three) times daily.    . hydrOXYzine (ATARAX/VISTARIL) 25 MG tablet Take 25 mg by mouth every 12 (twelve) hours as needed for anxiety or itching.   1  . Insulin Lispro Prot & Lispro (HUMALOG MIX 75/25 KWIKPEN) (75-25) 100 UNIT/ML Kwikpen Inject 30 Units into the skin 2 (two) times daily with a meal.     . metoprolol succinate (TOPROL-XL) 100 MG 24 hr tablet Take 100 mg by mouth daily. Take with  or immediately following a meal.    . Omega-3 1000 MG CAPS Take 1,000 mg by mouth daily.    Marland Kitchen oxyCODONE (ROXICODONE) 15 MG immediate release tablet Take 1 tablet (15  mg total) by mouth daily as needed for pain.  0  . Oxycodone HCl 10 MG TABS Take 10 mg by mouth 3 (three) times daily as needed.    Marland Kitchen oxyCODONE-acetaminophen (PERCOCET) 5-325 MG tablet Take 1 tablet by mouth every 4 (four) hours as needed for severe pain. 20 tablet 0  . pantoprazole (PROTONIX) 40 MG tablet Take 40 mg by mouth daily.    . pramipexole (MIRAPEX) 0.5 MG tablet Take 0.5 mg by mouth daily.   1  . rosuvastatin (CRESTOR) 40 MG tablet Take 1 tablet (40 mg total) by mouth daily. 90 tablet 1  . traZODone (DESYREL) 50 MG tablet Take 50 mg by mouth at bedtime.    Marland Kitchen zinc sulfate 220 (50 Zn) MG capsule Take 1 capsule (220 mg total) by mouth daily. 14 capsule 0   No current facility-administered medications for this visit.   Facility-Administered Medications Ordered in Other Visits  Medication Dose Route Frequency Provider Last Rate Last Admin  . 0.9 %  sodium chloride infusion  100 mL Intravenous PRN Lynnda Child, PA-C   New Bag at 12/27/18 1240  . 0.9 %  sodium chloride infusion  100 mL Intravenous PRN Lynnda Child, PA-C      . alteplase (CATHFLO ACTIVASE) injection 2 mg  2 mg Intracatheter Once PRN Lynnda Child, PA-C      . heparin injection 1,000 Units  1,000 Units Dialysis PRN Lynnda Child, PA-C      . heparin injection 10,900 Units  100 Units/kg Dialysis PRN Lynnda Child, PA-C      . heparin injection 10,900 Units  100 Units/kg Dialysis PRN Lynnda Child, PA-C      . lidocaine (PF) (XYLOCAINE) 1 % injection 5 mL  5 mL Intradermal PRN Lynnda Child, PA-C      . lidocaine-prilocaine (EMLA) cream 1 application  1 application Topical PRN Ejigiri, Thomos Lemons, PA-C      . pentafluoroprop-tetrafluoroeth (GEBAUERS) aerosol 1 application  1 application Topical PRN  Lynnda Child, PA-C        ROS:   Cardiac: No recent episodes of chest pain/pressure, no shortness of breath at rest.  + shortness of breath with exertion.  Denies history of atrial fibrillation or irregular heartbeat  Pulmonary: No home oxygen, no productive cough, no hemoptysis,  No asthma or wheezing  Urinary: [X]  chronic Kidney disease, [X]  on HD - [ ]  MWF or [X]  TTHS, [ ]  Burning with urination, [ ]  Frequent urination, [ ]  Difficulty urinating;   Skin: No rashes  Psychological: No history of anxiety,  No history of depression   Physical Examination  Vitals:   03/01/19 1106  BP: 130/73  Pulse: 71  Resp: 20  Temp: (!) 97.4 F (36.3 C)  SpO2: 95%  Weight: 229 lb (103.9 kg)  Height: 5' 8"  (1.727 m)    Body mass index is 34.82 kg/m.  General:  Alert and oriented, no acute distress HEENT: Normal Neck: No JVD Cardiac: Regular Rate and Rhythm  Extremity Pulses:  2+ radial, brachial right arm  Musculoskeletal: No deformity or edema, well-healed left thumb amputation  Neurologic: Upper and lower extremity motor 5/5 and symmetric   ASSESSMENT: Patient's left hand is now completely healed.  The ulcers on her right hand are also healed.  She needs long-term hemodialysis access.  Risk benefits possible complications of right upper arm graft were explained to the patient today.  She understands that she would be at  high risk of steal in the right arm and she will be on the look out for symptoms of numbness tingling aching or nonhealing wounds in her right arm.  She will call us if those occur.  Other risks and complications include bleeding graft thrombosis possible need for other procedures infection she understands and agrees to proceed   PLAN: Right upper arm graft scheduled for 03/12/2019   Ruta Hinds, MD [facility]: [phone] Pager: [phone]

## 2019-03-07 ENCOUNTER — Other Ambulatory Visit: Payer: Self-pay

## 2019-03-07 ENCOUNTER — Encounter (HOSPITAL_COMMUNITY): Payer: Self-pay | Admitting: Vascular Surgery

## 2019-03-07 NOTE — Progress Notes (Signed)
Pt denies any acute cardiopulmonary issues. Pt stated that she is under the care of Dr. Percival Spanish, Cardiology and Dr. Sandi Mariscal, PCP. Pt denies having a stress test. Pt made aware to stop taking vitamins, fish oil and herbal medications. Do not take any NSAIDs ie: Ibuprofen, Advil, Naproxen (Aleve), Motrin, BC and Goody Powder. Pt made aware aware to take 21 units of Humalog insulin the night before surgery and no insulin DOS. Pt made aware to check CBG every 2 hours prior to arrival to hospital on DOS. Pt made aware to treat a CBG < 70 with 4 ounces of apple juice, wait 15 minutes after intervention to recheck CBG, if CBG remains < 70, call Short Stay unit to speak with a nurse. Pt reminded to quarantine after COVID test. Pt verbalized understanding of all pre-op instructions. PA, Anesthesiology, asked to review pt history.

## 2019-03-08 ENCOUNTER — Encounter (HOSPITAL_COMMUNITY): Payer: Self-pay | Admitting: Vascular Surgery

## 2019-03-08 NOTE — Progress Notes (Signed)
Anesthesia Chart Review: [name]   Case: [id number] Date/Time: 03/12/19 1110   Procedure: INSERTION OF ARTERIOVENOUS (AV) GORE-TEX GRAFT ARM (Right )   Anesthesia type: Monitor Anesthesia Care   Pre-op diagnosis: END STAGE RENAL DISEASE   Location: [facility] OR ROOM 45 / [place] OR   Surgeons: Elam Dutch, MD      DISCUSSION: Patient is a 54 year old female scheduled for the above procedure. She needs permanent hemodialysis access.  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 S/p left thumb amputation for chronic osteomyelitis 12/27/18. COVID-19 09/14/18.  Presurgical COVID-19 test is scheduled for 03/10/19. Anesthesia team to evaluate on the day of surgery.   VS: LMP  (LMP Unknown) Comment: LMP Feb 2011. As of 03/01/19, BP 130/73, HR 71.   PROVIDERS: Sandi Mariscal, MD is PCP Minus Breeding, MD is cardiologist. Last evaluation 12/20/18. No further CV tested indicated at that time for CAD. She had previously been unable to complete MRI, so he hoped she can tolerate a cardiac MRI in the future under sedation; however, she did not have to have it prior to undergoing thumb amputation (he cleared her at acceptable risk for that procedure). Repeat CTA in one year for TAA surveillance recommended. He also encouraged her to follow-up with nephrology about renal/splenic masses on her October and September CTA scans. Six month cardiology follow-up  recommended.    LABS: She is for labs on the day of surgery. As of 12/27/18, H/H 11.9/35.0. A1c 8.7% on 09/19/18.   IMAGES: CTA Chest 11/14/18: IMPRESSION: 1. No demonstrable pulmonary embolus. 2. Dilatation of the main pulmonary outflow tract, a finding indicative of pulmonary arterial hypertension. 3. Ascending thoracic aortic prominence measuring 4.4 x 4.3 cm. No dissection evident. There are foci of aortic atherosclerosis as well as foci of great vessel and coronary artery calcification. There is left ventricular hypertrophy. Recommend annual imaging followup by CTA or MRA. This recommendation follows 2010 ACCF/AHA/AATS/ACR/ASA/SCA/SCAI/SIR/STS/SVM Guidelines for the Diagnosis and Management of Patients with Thoracic Aortic Disease. Circulation. 2010; 121: R678-L381. Aortic aneurysm NOS (ICD10-I71.9) 4. Adenopathy at several sites of uncertain etiology. Neoplastic etiology cannot be excluded. 5. Multiple areas of ground-glass type opacity, concerning for atypical organism pneumonia. Areas of atelectatic change also noted. Mild consolidation in the inferior lingula. 3 mm nodular opacity in the posterior segment right upper lobe noted. No follow-up needed if patient is low-risk. Non-contrast chest CT can be considered in 12 months if patient is high-risk. This recommendation follows the consensus statement: Guidelines for Management of Incidental Pulmonary Nodules Detected on CT Images: From the Fleischner Society 2017; Radiology 2017; 284:228-243. 6. Spleen appears prominent. Note that spleen is incompletely visualized. Splenic prominence as well as adenopathy potentially could be indicative of underlying lymphoma. Aortic Atherosclerosis (ICD10-I70.0). (Comparison Chest CTA 10/23/18: Lungs/Pleura: There are patchy ground-glass opacities within the right middle lobe and both lower lobes, more prominent than on prior abdominal CT in the lung bases. Linear atelectasis in the left  upper lobe. No definite septal thickening. No confluent  airspace disease. Trace atypical emphysema. The trachea and mainstem bronchi are Patent. Heterogeneous lesion in the upper right kidney. Splenic lesions.)   1V PCXR 11/14/18: COMPARISON: 10/25/2018 FINDINGS: No significant change in AP portable examination with bibasilar heterogeneous opacity, possible small layering pleural effusions, cardiomegaly, and large bore right chest multi lumen vascular catheter. There is no new airspace opacity. IMPRESSION: 1. Cardiomegaly. 2. Unchanged bibasilar opacities which may reflect atelectasis or infection/aspiration.   EKG: EKG 12/20/18 ([facility]): Normal Sinus Rhythm Right bundle branch block   CV: Echo 10/23/18: IMPRESSIONS 1. Left ventricular ejection fraction, by visual estimation, is 55 to 60%. The left ventricle has normal function. Normal left ventricular size. There is moderately increased left ventricular hypertrophy. 2. Global right ventricle has normal systolic function.The right ventricular size is mildly enlarged. No increase in right ventricular wall thickness. 3. Left atrial size was moderately dilated. 4. Right atrial size was mildly dilated. 5. Mild aortic valve annular calcification. 6. The mitral valve is normal in structure. Moderate to severe mitral valve regurgitation.No evidence of mitral stenosis. 7. The tricuspid valve is normal in structure. Tricuspid valve regurgitation was not visualized by color flow Doppler. 8. The aortic valve is normal in structure. Aortic valve regurgitation was not visualized by color flow Doppler. Mild to moderate aortic valve sclerosis/calcification without any evidence of aortic stenosis. 9. The pulmonic valve was normal in structure. Pulmonic valve regurgitation is mild by color flow Doppler. 10. Moderately elevated pulmonary artery systolic pressure.Theestimated right ventricular systolic pressure is  moderately elevated at 40.9 mmHg. 11. The inferior vena cava is dilated in size with <50% respiratory variability, suggesting right atrial pressure of 15 mmHg. (Comparison echo 06/29/17: LVEF 50-55%, grade 1 DD, mild MR,PA peak pressure: 41 mm Hg)   MR Cardiac 07/31/18: IMPRESSION: 1. Normal biventricular chamber size and function. 2. Asymmetric septal hypertrophy, 19 mm at the LV basal anteroseptum and anterior wall, and 15 mm at the LV mid inferoseptum. No significant flow dephasing to suggest LV outflow tract obstruction. No evidence of systolic anterior motion of the mitral valve. These findings in combination could suggest a variant morphology of hypertrophic cardiomyopathy. 3. Gadolinium contrast was not administered due to end stage renal disease. Unable to assess for burden of delayed enhancement in the evaluation of possible hypertrophic cardiomyopathy or other cardiomyopathic process.   CT Coronary with FFR 07/08/17: FINDINGS: Aorta:Normal size. Mild diffuse calcifications. No dissection. Aortic Valve:Trileaflet. No calcifications. Coronary Arteries:Normal coronary origin. Right dominance. -RCAis a large dominant artery that gives rise to PDA and PLVB. There mild to moderate diffuse calcified plaque with maximum stenosis 50-69% in proximal to mid segment. PDA and PLA are poorly visualized secondary to patients size. -Left main is a large artery that gives rise to LAD and LCX arteries. Left main has no plaque. -LADis a large vessel that gives rise to one diagonal artery. There is diffuse moderate calcified plaque with maximum stenosis of 50-69% in the mid segment. -D1is a medium size artery with mild diffuse calcified plaque and maximum stenosis 25-50% in the proximal portion. -LCX is a medium size non-dominant artery that gives rise to one large OM1 branch. There is only minimal calcified plaque. Other findings: -Normal pulmonary vein drainage  into the left atrium. -Normal let atrial appendage without a thrombus. -Severely dilated pulmonary artery measuring 48 mm suggestive of pulmonary hypertension. IMPRESSION: 1. Coronary calcium score of 837. This was 12 percentile for age and sex matched control. 2. Normal coronary origin with right dominance. 3. Diffuse calcified  plaque with moderate CAD in the proximal to mid RCA and mid LAD. Additional analysis with CT FFR will be submitted. Aggressive risk factor modification should be initiated. 4. Severely dilated pulmonary artery measuring 48 mm suggestive of pulmonary hypertension. (I do not see the FFR report, but cardiology notes indicate non-obstructive CAD with medication therapy recommended.)   Past Medical History:  Diagnosis Date  . Anemia   . Anxiety   . Arthritis    "knees" "hands", "RA"  . CAD (coronary artery disease)    Nonobstructive on CT 2019  . COVID-19 10/2018  . Dyspnea    "when I have too much fluid."  . ESRD (end stage renal disease) ([place])    Dialysis TTHSat- 3rd   . Headache(784.0)   . Heart murmur   . High cholesterol   . History of blood transfusion   . Hypertension   . Mitral regurgitation    moderate to severe MR 10/2018 echo  . 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 Pericardial effusion   . Pneumonia    ; 11/16/2018- "touch of pneumonia" - seen in ED- 11/14/2018- on antibiotic  . PVD (peripheral vascular disease) ([place])    Right leg stent in [place].  (No records)  . Restless legs   . Sleep apnea    does not use Cpap  . Thoracic ascending aortic aneurysm (HCC)    4.4 cm 11/14/18 CTA  . Type II diabetes mellitus ([place])     Past Surgical History:  Procedure Laterality Date  . AMPUTATION Left 12/27/2018   Procedure: LEFT THUMB REVISION AMPUTATION DIGIT;  Surgeon: Charlotte Crumb, MD;  Location: [place];  Service: Orthopedics;  Laterality: Left;  . AV FISTULA PLACEMENT Left   . [place]; 1997    x 2  . COLONOSCOPY    . EYE SURGERY Bilateral    cataract surgery  . INSERTION OF DIALYSIS CATHETER Right 09/26/2018   Procedure: INSERTION OF DIALYSIS CATHETER Right Internal Jugular.;  Surgeon: Elam Dutch, MD;  Location: [place];  Service: Vascular;  Laterality: Right;  . LIGATION OF ARTERIOVENOUS  FISTULA Left 09/26/2018   Procedure: LIGATION OF ARTERIOVENOUS  FISTULA LEFT ARM;  Surgeon: Elam Dutch, MD;  Location: [place];  Service: Vascular;  Laterality: Left;  . PERICARDIAL WINDOW  02/2004   for pericardial effusion  . TUBAL LIGATION  05/1995    MEDICATIONS: No current facility-administered medications for this encounter.   Lorin Picket 1 GM 210 MG(Fe) tablet  . CHANTIX STARTING MONTH PAK 0.5 MG X 11 & 1 MG X 42 tablet  . hydrOXYzine (ATARAX/VISTARIL) 25 MG tablet  . Insulin Lispro Prot & Lispro (HUMALOG MIX 75/25 KWIKPEN) (75-25) 100 UNIT/ML Kwikpen  . metoprolol succinate (TOPROL-XL) 100 MG 24 hr tablet  . Omega-3 1000 MG CAPS  . Oxycodone HCl 10 MG TABS  . pantoprazole (PROTONIX) 40 MG tablet  . pramipexole (MIRAPEX) 0.5 MG tablet  . rosuvastatin (CRESTOR) 40 MG tablet  . traZODone (DESYREL) 50 MG tablet  . zinc sulfate 220 (50 Zn) MG capsule  . blood glucose meter kit and supplies KIT  . oxyCODONE (ROXICODONE) 15 MG immediate release tablet  . oxyCODONE-acetaminophen (PERCOCET) 5-325 MG tablet   . 0.9 %  sodium chloride infusion  . 0.9 %  sodium chloride infusion  . alteplase (CATHFLO ACTIVASE) injection 2 mg  . heparin injection 1,000 Units  . heparin injection 10,900 Units  . heparin  injection 10,900 Units  . lidocaine (PF) (XYLOCAINE) 1 % injection 5 mL  . lidocaine-prilocaine (EMLA) cream 1 application  . pentafluoroprop-tetrafluoroeth (GEBAUERS) aerosol 1 application     Myra Gianotti, PA-C Surgical Short Stay/Anesthesiology [facility] Phone [phone] [facility] Phone [phone] 03/08/2019 8:01 AM

## 2019-03-08 NOTE — Anesthesia Preprocedure Evaluation (Addendum)
Anesthesia Evaluation  Patient identified by MRN, date of birth, ID band Patient awake    Reviewed: Allergy & Precautions, NPO status , Patient's Chart, lab work & pertinent test results  History of Anesthesia Complications Negative for: history of anesthetic complications  Airway Mallampati: III  TM Distance: >3 FB Neck ROM: Full    Dental  (+) Dental Advisory Given   Pulmonary shortness of breath, sleep apnea , pneumonia, Current Smoker and Patient abstained from smoking.,    breath sounds clear to auscultation       Cardiovascular hypertension, + CAD and + Peripheral Vascular Disease   Rhythm:Regular     Neuro/Psych  Headaches, PSYCHIATRIC DISORDERS Anxiety    GI/Hepatic   Endo/Other  diabetes  Renal/GU ESRF and DialysisRenal disease     Musculoskeletal  (+) Arthritis ,   Abdominal   Peds  Hematology   Anesthesia Other Findings  History includessmoking, ESRD (HD TTS, [facility]; s/p right IJ Palidrome catheter, ligation of LUE AVF for steal syndrome 09/26/18), HTN, DM2, hypercholesterolemia, PVD (s/p RLE stent), anemia, CAD (non-obstructive 2019 coronary CT), LVH, pericarditis with pericardial effusion (s/p pericardial window 02/07/04), 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 S/p left thumb amputation for chronic osteomyelitis 12/27/18. COVID-19 09/14/18.  Reproductive/Obstetrics                             Anesthesia Physical Anesthesia Plan  ASA: IV  Anesthesia Plan: MAC   Post-op Pain Management:    Induction: Intravenous  PONV Risk Score and Plan: 1 and Treatment may vary due to age or medical condition and Propofol  infusion  Airway Management Planned: Nasal Cannula  Additional Equipment:   Intra-op Plan:   Post-operative Plan:   Informed Consent: I have reviewed the patients History and Physical, chart, labs and discussed the procedure including the risks, benefits and alternatives for the proposed anesthesia with the patient or authorized representative who has indicated his/her understanding and acceptance.     Dental advisory given  Plan Discussed with: CRNA and Surgeon  Anesthesia Plan Comments: (PAT note written 03/08/2019 by Myra Gianotti, PA-C. )       Anesthesia Quick Evaluation

## 2019-03-10 ENCOUNTER — Other Ambulatory Visit (HOSPITAL_COMMUNITY)
Admission: RE | Admit: 2019-03-10 | Discharge: 2019-03-10 | Disposition: A | Discharge status: Home / Self Care | Payer: Medicare Other | Source: Ambulatory Visit | Attending: Vascular Surgery | Admitting: Vascular Surgery

## 2019-03-10 DIAGNOSIS — Z20822 Contact with and (suspected) exposure to covid-19: Secondary | ICD-10-CM | POA: Diagnosis not present

## 2019-03-10 DIAGNOSIS — Z01812 Encounter for preprocedural laboratory examination: Secondary | ICD-10-CM | POA: Insufficient documentation

## 2019-03-10 LAB — SARS CORONAVIRUS 2 (TAT 6-24 HRS): SARS Coronavirus 2: NEGATIVE

## 2019-03-12 ENCOUNTER — Ambulatory Visit (HOSPITAL_COMMUNITY): Payer: Medicare Other | Admitting: Vascular Surgery

## 2019-03-12 ENCOUNTER — Encounter (HOSPITAL_COMMUNITY)
Admission: RE | Disposition: A | Discharge status: Home / Self Care | Payer: Self-pay | Source: Home / Self Care | Attending: Vascular Surgery

## 2019-03-12 ENCOUNTER — Encounter (HOSPITAL_COMMUNITY): Payer: Self-pay | Admitting: Vascular Surgery

## 2019-03-12 ENCOUNTER — Ambulatory Visit (HOSPITAL_COMMUNITY)
Admission: RE | Admit: 2019-03-12 | Discharge: 2019-03-12 | Disposition: A | Discharge status: Home / Self Care | Payer: Medicare Other | Attending: Vascular Surgery | Admitting: Vascular Surgery

## 2019-03-12 ENCOUNTER — Other Ambulatory Visit: Payer: Self-pay

## 2019-03-12 DIAGNOSIS — Z992 Dependence on renal dialysis: Secondary | ICD-10-CM | POA: Diagnosis not present

## 2019-03-12 DIAGNOSIS — M069 Rheumatoid arthritis, unspecified: Secondary | ICD-10-CM | POA: Diagnosis not present

## 2019-03-12 DIAGNOSIS — G2581 Restless legs syndrome: Secondary | ICD-10-CM | POA: Diagnosis not present

## 2019-03-12 DIAGNOSIS — N186 End stage renal disease: Secondary | ICD-10-CM | POA: Diagnosis not present

## 2019-03-12 DIAGNOSIS — E1151 Type 2 diabetes mellitus with diabetic peripheral angiopathy without gangrene: Secondary | ICD-10-CM | POA: Insufficient documentation

## 2019-03-12 DIAGNOSIS — Z89012 Acquired absence of left thumb: Secondary | ICD-10-CM | POA: Insufficient documentation

## 2019-03-12 DIAGNOSIS — I340 Nonrheumatic mitral (valve) insufficiency: Secondary | ICD-10-CM | POA: Insufficient documentation

## 2019-03-12 DIAGNOSIS — Z8616 Personal history of COVID-19: Secondary | ICD-10-CM | POA: Insufficient documentation

## 2019-03-12 DIAGNOSIS — G4730 Sleep apnea, unspecified: Secondary | ICD-10-CM | POA: Insufficient documentation

## 2019-03-12 DIAGNOSIS — N185 Chronic kidney disease, stage 5: Secondary | ICD-10-CM

## 2019-03-12 DIAGNOSIS — F17210 Nicotine dependence, cigarettes, uncomplicated: Secondary | ICD-10-CM | POA: Insufficient documentation

## 2019-03-12 DIAGNOSIS — I120 Hypertensive chronic kidney disease with stage 5 chronic kidney disease or end stage renal disease: Secondary | ICD-10-CM | POA: Insufficient documentation

## 2019-03-12 DIAGNOSIS — E1122 Type 2 diabetes mellitus with diabetic chronic kidney disease: Secondary | ICD-10-CM | POA: Diagnosis not present

## 2019-03-12 DIAGNOSIS — Z79899 Other long term (current) drug therapy: Secondary | ICD-10-CM | POA: Insufficient documentation

## 2019-03-12 DIAGNOSIS — Z8249 Family history of ischemic heart disease and other diseases of the circulatory system: Secondary | ICD-10-CM | POA: Diagnosis not present

## 2019-03-12 DIAGNOSIS — D649 Anemia, unspecified: Secondary | ICD-10-CM | POA: Insufficient documentation

## 2019-03-12 DIAGNOSIS — I2510 Atherosclerotic heart disease of native coronary artery without angina pectoris: Secondary | ICD-10-CM | POA: Insufficient documentation

## 2019-03-12 DIAGNOSIS — Z794 Long term (current) use of insulin: Secondary | ICD-10-CM | POA: Diagnosis not present

## 2019-03-12 DIAGNOSIS — F419 Anxiety disorder, unspecified: Secondary | ICD-10-CM | POA: Diagnosis not present

## 2019-03-12 DIAGNOSIS — E7800 Pure hypercholesterolemia, unspecified: Secondary | ICD-10-CM | POA: Diagnosis not present

## 2019-03-12 HISTORY — PX: AV FISTULA PLACEMENT: SHX1204

## 2019-03-12 HISTORY — DX: Aneurysm of the ascending aorta, without rupture: I71.21

## 2019-03-12 HISTORY — DX: Thoracic aortic aneurysm, without rupture: I71.2

## 2019-03-12 LAB — POCT I-STAT, CHEM 8
BUN: 51 mg/dL — ABNORMAL HIGH (ref 6–20)
Calcium, Ion: 0.96 mmol/L — ABNORMAL LOW (ref 1.15–1.40)
Chloride: 100 mmol/L (ref 98–111)
Creatinine, Ser: 6.8 mg/dL — ABNORMAL HIGH (ref 0.44–1.00)
Glucose, Bld: 108 mg/dL — ABNORMAL HIGH (ref 70–99)
HCT: 38 % (ref 36.0–46.0)
Hemoglobin: 12.9 g/dL (ref 12.0–15.0)
Potassium: 4.4 mmol/L (ref 3.5–5.1)
Sodium: 135 mmol/L (ref 135–145)
TCO2: 27 mmol/L (ref 22–32)

## 2019-03-12 LAB — GLUCOSE, CAPILLARY
Glucose-Capillary: 111 mg/dL — ABNORMAL HIGH (ref 70–99)
Glucose-Capillary: 77 mg/dL (ref 70–99)
Glucose-Capillary: 111 mg/dL — ABNORMAL HIGH (ref 70–99)

## 2019-03-12 SURGERY — INSERTION OF ARTERIOVENOUS (AV) GORE-TEX GRAFT ARM
Anesthesia: Monitor Anesthesia Care | Site: Arm Upper | Laterality: Right

## 2019-03-12 MED ORDER — CHLORHEXIDINE GLUCONATE 4 % EX LIQD: 60.0000 mL | Freq: Once | CUTANEOUS | Status: DC

## 2019-03-12 MED ORDER — LIDOCAINE 2% (20 MG/ML) 5 ML SYRINGE
INTRAMUSCULAR | Status: DC | PRN
  Administered 2019-03-12: 40 mg via INTRAVENOUS

## 2019-03-12 MED ORDER — PROPOFOL 500 MG/50ML IV EMUL
INTRAVENOUS | Status: DC | PRN
  Administered 2019-03-12: 75 ug/kg/min via INTRAVENOUS

## 2019-03-12 MED ORDER — HEMOSTATIC AGENTS (NO CHARGE) OPTIME
TOPICAL | Status: DC | PRN
  Administered 2019-03-12 (×2): 1 via TOPICAL

## 2019-03-12 MED ORDER — PHENYLEPHRINE 40 MCG/ML (10ML) SYRINGE FOR IV PUSH (FOR BLOOD PRESSURE SUPPORT)
PREFILLED_SYRINGE | INTRAVENOUS | Status: AC
  Filled 2019-03-12: qty 10

## 2019-03-12 MED ORDER — LIDOCAINE HCL (PF) 1 % IJ SOLN
INTRAMUSCULAR | Status: AC
  Filled 2019-03-12: qty 30

## 2019-03-12 MED ORDER — HEPARIN SODIUM (PORCINE) 1000 UNIT/ML IJ SOLN
INTRAMUSCULAR | Status: DC | PRN
  Administered 2019-03-12: 5000 [IU] via INTRAVENOUS

## 2019-03-12 MED ORDER — LIDOCAINE 2% (20 MG/ML) 5 ML SYRINGE
INTRAMUSCULAR | Status: AC
  Filled 2019-03-12: qty 5

## 2019-03-12 MED ORDER — PROTAMINE SULFATE 10 MG/ML IV SOLN
INTRAVENOUS | Status: DC | PRN
  Administered 2019-03-12 (×5): 10 mg via INTRAVENOUS

## 2019-03-12 MED ORDER — SODIUM CHLORIDE 0.9 % IV SOLN
INTRAVENOUS | Status: DC | PRN
  Administered 2019-03-12: 500 mL

## 2019-03-12 MED ORDER — HEPARIN SODIUM (PORCINE) 1000 UNIT/ML IJ SOLN
INTRAMUSCULAR | Status: AC
  Filled 2019-03-12: qty 1

## 2019-03-12 MED ORDER — CEFAZOLIN SODIUM-DEXTROSE 2-4 GM/100ML-% IV SOLN
INTRAVENOUS | Status: AC
  Filled 2019-03-12: qty 100

## 2019-03-12 MED ORDER — PHENYLEPHRINE 40 MCG/ML (10ML) SYRINGE FOR IV PUSH (FOR BLOOD PRESSURE SUPPORT)
PREFILLED_SYRINGE | INTRAVENOUS | Status: DC | PRN
  Administered 2019-03-12 (×3): 80 ug via INTRAVENOUS

## 2019-03-12 MED ORDER — SODIUM CHLORIDE 0.9 % IV SOLN
INTRAVENOUS | Status: DC
  Administered 2019-03-12: 10 mL/h via INTRAVENOUS

## 2019-03-12 MED ORDER — PROTAMINE SULFATE 10 MG/ML IV SOLN
INTRAVENOUS | Status: AC
  Filled 2019-03-12: qty 5

## 2019-03-12 MED ORDER — 0.9 % SODIUM CHLORIDE (POUR BTL) OPTIME
TOPICAL | Status: DC | PRN
  Administered 2019-03-12: 12:00:00 1000 mL

## 2019-03-12 MED ORDER — PROPOFOL 10 MG/ML IV BOLUS
INTRAVENOUS | Status: DC | PRN
  Administered 2019-03-12: 20 mg via INTRAVENOUS

## 2019-03-12 MED ORDER — DEXTROSE 50 % IV SOLN
INTRAVENOUS | Status: DC | PRN
  Administered 2019-03-12: 12.5 g via INTRAVENOUS

## 2019-03-12 MED ORDER — FENTANYL CITRATE (PF) 100 MCG/2ML IJ SOLN
INTRAMUSCULAR | Status: DC | PRN
  Administered 2019-03-12 (×2): 25 ug via INTRAVENOUS

## 2019-03-12 MED ORDER — FENTANYL CITRATE (PF) 250 MCG/5ML IJ SOLN
INTRAMUSCULAR | Status: AC
  Filled 2019-03-12: qty 5

## 2019-03-12 MED ORDER — CEFAZOLIN SODIUM-DEXTROSE 2-4 GM/100ML-% IV SOLN
2.0000 g | INTRAVENOUS | Status: AC
  Administered 2019-03-12: 2 g via INTRAVENOUS

## 2019-03-12 MED ORDER — PROPOFOL 10 MG/ML IV BOLUS
INTRAVENOUS | Status: AC
  Filled 2019-03-12: qty 20

## 2019-03-12 MED ORDER — LIDOCAINE HCL (PF) 1 % IJ SOLN
INTRAMUSCULAR | Status: DC | PRN
  Administered 2019-03-12: 30 mL via INTRADERMAL

## 2019-03-12 MED ORDER — SODIUM CHLORIDE 0.9 % IV SOLN
INTRAVENOUS | Status: AC
  Filled 2019-03-12: qty 1.2

## 2019-03-12 SURGICAL SUPPLY — 40 items
ADH SKN CLS APL DERMABOND .7 (GAUZE/BANDAGES/DRESSINGS) ×1
AGENT HMST SPONGE THK3/8 (HEMOSTASIS) ×1
ARMBAND PINK RESTRICT EXTREMIT (MISCELLANEOUS) ×4 IMPLANT
CANISTER SUCT 3000ML PPV (MISCELLANEOUS) ×2 IMPLANT
CANNULA VESSEL 3MM 2 BLNT TIP (CANNULA) ×2 IMPLANT
CLIP VESOCCLUDE MED 6/CT (CLIP) ×2 IMPLANT
CLIP VESOCCLUDE SM WIDE 6/CT (CLIP) ×2 IMPLANT
COVER WAND RF STERILE (DRAPES) ×2 IMPLANT
DECANTER SPIKE VIAL GLASS SM (MISCELLANEOUS) ×2 IMPLANT
DERMABOND ADVANCED (GAUZE/BANDAGES/DRESSINGS) ×1
DERMABOND ADVANCED .7 DNX12 (GAUZE/BANDAGES/DRESSINGS) ×1 IMPLANT
ELECT REM PT RETURN 9FT ADLT (ELECTROSURGICAL) ×2
ELECTRODE REM PT RTRN 9FT ADLT (ELECTROSURGICAL) ×1 IMPLANT
GAUZE SPONGE 4X4 16PLY XRAY LF (GAUZE/BANDAGES/DRESSINGS) ×1 IMPLANT
GLOVE BIO SURGEON STRL SZ 6 (GLOVE) ×1 IMPLANT
GLOVE BIO SURGEON STRL SZ 6.5 (GLOVE) ×2 IMPLANT
GLOVE BIO SURGEON STRL SZ7.5 (GLOVE) ×2 IMPLANT
GLOVE BIOGEL PI IND STRL 6.5 (GLOVE) IMPLANT
GLOVE BIOGEL PI IND STRL 7.0 (GLOVE) IMPLANT
GLOVE BIOGEL PI INDICATOR 6.5 (GLOVE) ×2
GLOVE BIOGEL PI INDICATOR 7.0 (GLOVE) ×2
GOWN STRL REUS W/ TWL LRG LVL3 (GOWN DISPOSABLE) ×3 IMPLANT
GOWN STRL REUS W/TWL LRG LVL3 (GOWN DISPOSABLE) ×8
GRAFT GORETEX STRT 4-7X45 (Vascular Products) ×1 IMPLANT
HEMOSTAT SNOW SURGICEL 2X4 (HEMOSTASIS) ×1 IMPLANT
HEMOSTAT SPONGE AVITENE ULTRA (HEMOSTASIS) ×1 IMPLANT
KIT BASIN OR (CUSTOM PROCEDURE TRAY) ×2 IMPLANT
KIT TURNOVER KIT B (KITS) ×2 IMPLANT
NS IRRIG 1000ML POUR BTL (IV SOLUTION) ×2 IMPLANT
PACK CV ACCESS (CUSTOM PROCEDURE TRAY) ×2 IMPLANT
PAD ARMBOARD 7.5X6 YLW CONV (MISCELLANEOUS) ×4 IMPLANT
SUT PROLENE 6 0 CC (SUTURE) ×5 IMPLANT
SUT VIC AB 3-0 SH 27 (SUTURE) ×4
SUT VIC AB 3-0 SH 27X BRD (SUTURE) ×2 IMPLANT
SUT VICRYL 4-0 PS2 18IN ABS (SUTURE) ×4 IMPLANT
SYR TOOMEY 50ML (SYRINGE) IMPLANT
TOWEL GREEN STERILE (TOWEL DISPOSABLE) ×5 IMPLANT
TOWEL GREEN STERILE FF (TOWEL DISPOSABLE) ×2 IMPLANT
UNDERPAD 30X30 (UNDERPADS AND DIAPERS) ×2 IMPLANT
WATER STERILE IRR 1000ML POUR (IV SOLUTION) ×2 IMPLANT

## 2019-03-12 NOTE — Op Note (Signed)
Procedure: Left Upper Arm AV graft  Preop: ESRD  Postop: ESRD  Anesthesia: Local with sedation  Findings:4-7 mm PTFE end to side to axillary vein  Asst: Corrina Baglia PA-C   Procedure Details: After adequate sedation, the right upper extremity was prepped and draped in usual sterile fashion. Local anesthesia was infiltrated in the antecubital crease and axilla.   A longitudinal incision was then made near the antecubital crease the right arm.  There were no suitable antecubital veins for outflow.   The incision was carried into the subcutaneous tissues down to level of the brachial artery. Next the brachial artery was dissected free in the medial portion incision. The artery was  4-5 mm in diameter. The vessel loops were placed proximal and distal to the planned site of arteriotomy. At this point, a longitudinal incision was made in the axilla and carried through the subcutaneous tissues and fascia to expose the axillary vein.  The nerves were protected.  The vein was approximately 4-5 mm in diameter. Next, a subcutaneous tunnel was created connecting the upper arm to the lower arm incision in an arcing configuration over the biceps muscle.  A 4-7 mm PTFE graft was then brought through this subcutaneous tunnel. The patient was given 5000 units of intravenous heparin. After appropriate circulation time, the vessel loops were used to control the artery. A longitudinal opening was made in the right brachial artery.  The 4 mm end of the graft was beveled and sewn end to side to the artery using a 6 0 prolene.  At completion of the anastomosis the artery was forward bled, backbled and thoroughly flushed.  The anastomosis was secured, vessel loops were released and there was palpable pulse in the graft.  The graft was clamped just above the arterial anastomosis with a fistula clamp. The graft was then pulled taut to length at the axillary incision.  The axillary vein was controlled with a fine bulldog clamp  in the upper axilla proximally and distally.  The vein was opened longitudinally.  The distal end of the graft was then beveled and sewn end to side to the vein using a running 6 0 prolene.  Just prior to completion of the anastomosis, everything was forward bled, back bled and thoroughly flushed.  The anastomosis was secured and the fistula clamp removed from the proximal graft.  A thrill was immediately palpable in the graft.  After hemostasis was obtained, the subcutaneous tissues were reapproximated using a running 3-0 Vicryl suture. The skin was then closed with a 4 0 Vicryl subcuticular stitch. Dermabond was applied to the skin incisions.  The patient tolerated the procedure well and there were no complications.  Instrument sponge and needle count was correct at the end of the case.  The patient was taken to the recovery room in stable condition.   Ruta Hinds, MD [facility] Office: [phone] Pager: [phone]

## 2019-03-12 NOTE — Transfer of Care (Signed)
Immediate Anesthesia Transfer of Care Note  Patient: [name]  Procedure(s) Performed: INSERTION OF ARTERIOVENOUS (AV) GORE-TEX GRAFT ARM (Right Arm Upper)  Patient Location: PACU  Anesthesia Type:MAC  Level of Consciousness: awake and alert   Airway & Oxygen Therapy: Patient Spontanous Breathing and Patient connected to nasal cannula oxygen  Post-op Assessment: Report given to RN and Post -op Vital signs reviewed and stable  Post vital signs: Reviewed and stable  Last Vitals:  Vitals Value Taken Time  BP 91/73 03/12/19 1348  Temp    Pulse 61 03/12/19 1359  Resp 22 03/12/19 1359  SpO2 91 % 03/12/19 1359  Vitals shown include unvalidated device data.  Last Pain:  Vitals:   03/12/19 0907  TempSrc: Oral         Complications: No apparent anesthesia complications

## 2019-03-12 NOTE — Anesthesia Procedure Notes (Signed)
Procedure Name: MAC Date/Time: 03/12/2019 11:30 AM Performed by: Eligha Bridegroom, CRNA Pre-anesthesia Checklist: Patient identified, Emergency Drugs available, Suction available, Patient being monitored and Timeout performed Patient Re-evaluated:Patient Re-evaluated prior to induction Oxygen Delivery Method: Nasal cannula Preoxygenation: Pre-oxygenation with 100% oxygen Induction Type: IV induction

## 2019-03-12 NOTE — Interval H&P Note (Signed)
History and Physical Interval Note:  03/12/2019 10:41 AM  [name]  has presented today for surgery, with the diagnosis of END STAGE RENAL DISEASE.  The various methods of treatment have been discussed with the patient and family. After consideration of risks, benefits and other options for treatment, the patient has consented to  Procedure(s): INSERTION OF ARTERIOVENOUS (AV) GORE-TEX GRAFT ARM (Right) as a surgical intervention.  The patient's history has been reviewed, patient examined, no change in status, stable for surgery.  I have reviewed the patient's chart and labs.  Questions were answered to the patient's satisfaction.     Ruta Hinds

## 2019-03-12 NOTE — Discharge Instructions (Signed)
° °  [facility] [place] [facility] ° °Discharge Instructions ° °AV Fistula or Graft Surgery for Dialysis Access ° °Please refer to the following instructions for your post-procedure care. Your surgeon or physician assistant will discuss any changes with you. ° °Activity ° °You may drive the day following your surgery, if you are comfortable and no longer taking prescription pain medication. Resume full activity as the soreness in your incision resolves. ° °Bathing/Showering ° °You may shower after you go home. Keep your incision dry for 48 hours. Do not soak in a bathtub, hot tub, or swim until the incision heals completely. You may not shower if you have a hemodialysis catheter. ° °Incision Care ° °Clean your incision with mild soap and water after 48 hours. Pat the area dry with a clean towel. You do not need a bandage unless otherwise instructed. Do not apply any ointments or creams to your incision. You may have skin glue on your incision. Do not peel it off. It will come off on its own in about one week. Your arm may swell a bit after surgery. To reduce swelling use pillows to elevate your arm so it is above your heart. Your doctor will tell you if you need to lightly wrap your arm with an ACE bandage. ° °Diet ° °Resume your normal diet. There are not special food restrictions following this procedure. In order to heal from your surgery, it is CRITICAL to get adequate nutrition. Your body requires vitamins, minerals, and protein. Vegetables are the best source of vitamins and minerals. Vegetables also provide the perfect balance of protein. Processed food has little nutritional value, so try to avoid this. ° °Medications ° °Resume taking all of your medications. If your incision is causing pain, you may take over-the counter pain relievers such as acetaminophen (Tylenol). If you were prescribed a stronger pain medication, please be aware these medications can cause nausea and constipation. Prevent  nausea by taking the medication with a snack or meal. Avoid constipation by drinking plenty of fluids and eating foods with high amount of fiber, such as fruits, vegetables, and grains. Do not take Tylenol if you are taking prescription pain medications. ° ° ° ° °Follow up °Your surgeon may want to see you in the office following your access surgery. If so, this will be arranged at the time of your surgery. ° °Please call us immediately for any of the following conditions: ° °Increased pain, redness, drainage (pus) from your incision site °Fever of 101 degrees or higher °Severe or worsening pain at your incision site °Hand pain or numbness. ° °Reduce your risk of vascular disease: ° °Stop smoking. If you would like help, call [facility] at 1-800-QUIT-NOW (1-800-784-8669) or [place] at [phone] ° °Manage your cholesterol °Maintain a desired weight °Control your diabetes °Keep your blood pressure down ° °Dialysis ° °It will take several weeks to several months for your new dialysis access to be ready for use. Your surgeon will determine when it is OK to use it. Your nephrologist will continue to direct your dialysis. You can continue to use your Permcath until your new access is ready for use. ° °If you have any questions, please call the office at [phone]. ° °

## 2019-03-15 ENCOUNTER — Encounter: Payer: Self-pay | Admitting: Anesthesiology

## 2019-03-15 NOTE — Anesthesia Postprocedure Evaluation (Signed)
Anesthesia Post Note  Patient: [name]  Procedure(s) Performed: INSERTION OF ARTERIOVENOUS (AV) GORE-TEX GRAFT ARM (Right Arm Upper)     Patient location during evaluation: PACU Anesthesia Type: MAC Level of consciousness: awake and alert Pain management: pain level controlled Vital Signs Assessment: post-procedure vital signs reviewed and stable Respiratory status: spontaneous breathing, nonlabored ventilation, respiratory function stable and patient connected to nasal cannula oxygen Cardiovascular status: stable and blood pressure returned to baseline Postop Assessment: no apparent nausea or vomiting Anesthetic complications: no    Last Vitals:  Vitals:   03/12/19 1400 03/12/19 1415  BP: 101/78 104/77  Pulse: 61 (!) 57  Resp: (!) 21 17  Temp:  36.8 C  SpO2: 92% 92%    Last Pain:  Vitals:   03/12/19 0907  TempSrc: Oral                 Kelso Bibby

## 2019-03-19 ENCOUNTER — Encounter: Payer: Self-pay | Admitting: *Deleted

## 2019-03-20 DIAGNOSIS — G8929 Other chronic pain: Secondary | ICD-10-CM | POA: Insufficient documentation

## 2019-03-20 DIAGNOSIS — R52 Pain, unspecified: Secondary | ICD-10-CM | POA: Insufficient documentation

## 2019-04-09 NOTE — Progress Notes (Signed)
POST OPERATIVE OFFICE NOTE    CC:  F/u for surgery  HPI:  This is a 54 y.o. female who is s/p right upper arm graft placement on 03/12/2019 by Dr. Oneida Alar who presents today for follow up.    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    She presents today for follow up.  She states she has done well since surgery.  She does not have any pain in her right hand.  She does have a callus on the middle finger, but this is unchanged.    She dialyzes T/T/S via [facility] at the [place] location.    The pt does not have evidence of steal sx.   Allergies  Allergen Reactions  . Bupropion Itching    Current Outpatient Medications  Medication Sig Dispense Refill  . AURYXIA 1 GM 210 MG(Fe) tablet Take 630-1,050 mg by mouth See admin instructions. Take 5 tablets (1050 mg) by mouth with each meal & take 3 tablets (630 mg) by mouth with each snack    . blood glucose meter kit and supplies KIT Dispense based on patient and insurance preference. Use up to four times daily as directed. (FOR ICD-9 250.00, 250.01). 1 each 0  . CHANTIX STARTING MONTH PAK 0.5 MG X 11 & 1 MG X 42 tablet Take 1 mg by mouth 2 (two) times daily.     . hydrOXYzine (ATARAX/VISTARIL) 25 MG tablet Take 25 mg by mouth every 12 (twelve) hours as needed for itching.   1  . Insulin Lispro Prot & Lispro (HUMALOG MIX 75/25 KWIKPEN) (75-25) 100 UNIT/ML Kwikpen Inject 30 Units into the skin 2 (two) times daily with a meal.     . metoprolol succinate (TOPROL-XL) 100 MG 24 hr tablet Take 100 mg by mouth daily. Take with or immediately following a meal.    . Omega-3 1000 MG CAPS Take 1,000 mg by mouth daily.    . Oxycodone HCl 10 MG TABS Take 10 mg by mouth 3 (three) times daily as needed.    . pantoprazole (PROTONIX) 40 MG tablet Take 40 mg by mouth daily.    . pramipexole (MIRAPEX) 0.5 MG tablet Take 0.5 mg by mouth at bedtime.   1  .  rosuvastatin (CRESTOR) 40 MG tablet Take 1 tablet (40 mg total) by mouth daily. 90 tablet 1  . traZODone (DESYREL) 50 MG tablet Take 50 mg by mouth at bedtime as needed for sleep.     Marland Kitchen zinc sulfate 220 (50 Zn) MG capsule Take 1 capsule (220 mg total) by mouth daily. 14 capsule 0   No current facility-administered medications for this visit.   Facility-Administered Medications Ordered in Other Visits  Medication Dose Route Frequency Provider Last Rate Last Admin  . 0.9 %  sodium chloride infusion  100 mL Intravenous PRN Lynnda Child, PA-C   New Bag at 03/12/19 1121  . 0.9 %  sodium chloride infusion  100 mL Intravenous PRN Lynnda Child, PA-C      . alteplase (CATHFLO ACTIVASE) injection 2 mg  2 mg Intracatheter Once PRN Lynnda Child, PA-C      . heparin injection 1,000 Units  1,000 Units Dialysis PRN Lynnda Child, PA-C      . heparin injection 10,900 Units  100 Units/kg Dialysis PRN Lynnda Child, PA-C      .  heparin injection 10,900 Units  100 Units/kg Dialysis PRN Lynnda Child, PA-C      . lidocaine (PF) (XYLOCAINE) 1 % injection 5 mL  5 mL Intradermal PRN Lynnda Child, PA-C      . lidocaine-prilocaine (EMLA) cream 1 application  1 application Topical PRN Ejigiri, Thomos Lemons, PA-C      . pentafluoroprop-tetrafluoroeth (GEBAUERS) aerosol 1 application  1 application Topical PRN Lynnda Child, PA-C         ROS:  See HPI  Physical Exam:  Today's Vitals   04/11/19 1001  BP: (!) 176/57  Pulse: 76  Resp: 18  Temp: (!) 92 F (33.3 C)  TempSrc: Oral  SpO2: 92%  Weight: 233 lb 11 oz (106 kg)  Height: 5' 8"  (1.727 m)   Body mass index is 35.53 kg/m.   Incision:  Antecubital and upper arm incisions have healed nicely.  Extremities:  There is not a palpable radial pulse bilaterally, but she does have excellent doppler signals bilateral radial and ulnar position.  Motor and sensory is in tact.  There is a  thrill/bruit present.  Her left thumb amputation site has healed.  There is a callus on the right middle finger.  Pt reports this is not worsening and is stable.   Dialysis Duplex None today  Assessment/Plan:  This is a 54 y.o. female who is s/p: Right upper arm graft placement on 03/12/2019 by Dr. Oneida Alar  -the pt does not have evidence of steal.  She has brisk doppler signals bilateral radial and ulnar.  Motor and sensory are intact. -the graft can be used now. -If pt has tunneled dialysis catheter and the access has been used successfully to the satisfaction of the dialysis center, the tunneled catheter can be removed at their discretion.   -the pt will follow up as needed.    Leontine Locket, PA-C [facility] [phone]  Clinic MD:  Oneida Alar

## 2019-04-11 ENCOUNTER — Ambulatory Visit (INDEPENDENT_AMBULATORY_CARE_PROVIDER_SITE_OTHER): Payer: Medicare Other | Admitting: Physician Assistant

## 2019-04-11 ENCOUNTER — Other Ambulatory Visit: Payer: Self-pay

## 2019-04-11 VITALS — BP 176/57 | HR 76 | Temp 92.0°F | Resp 18 | Ht 68.0 in | Wt 233.7 lb

## 2019-04-11 DIAGNOSIS — Z992 Dependence on renal dialysis: Secondary | ICD-10-CM

## 2019-04-11 DIAGNOSIS — N186 End stage renal disease: Secondary | ICD-10-CM

## 2019-05-03 ENCOUNTER — Other Ambulatory Visit: Payer: Self-pay | Admitting: Radiology

## 2019-05-03 ENCOUNTER — Other Ambulatory Visit (HOSPITAL_COMMUNITY): Payer: Self-pay | Admitting: Nephrology

## 2019-05-03 DIAGNOSIS — N186 End stage renal disease: Secondary | ICD-10-CM

## 2019-05-04 ENCOUNTER — Other Ambulatory Visit: Payer: Self-pay

## 2019-05-04 ENCOUNTER — Other Ambulatory Visit: Payer: Self-pay | Admitting: Radiology

## 2019-05-04 ENCOUNTER — Other Ambulatory Visit (HOSPITAL_COMMUNITY): Payer: Self-pay | Admitting: Nephrology

## 2019-05-04 ENCOUNTER — Encounter (HOSPITAL_COMMUNITY): Payer: Self-pay

## 2019-05-04 ENCOUNTER — Ambulatory Visit (HOSPITAL_COMMUNITY)
Admission: RE | Admit: 2019-05-04 | Discharge: 2019-05-04 | Disposition: A | Discharge status: Home / Self Care | Payer: Medicare Other | Source: Ambulatory Visit | Attending: Nephrology | Admitting: Nephrology

## 2019-05-04 DIAGNOSIS — I712 Thoracic aortic aneurysm, without rupture: Secondary | ICD-10-CM | POA: Insufficient documentation

## 2019-05-04 DIAGNOSIS — F419 Anxiety disorder, unspecified: Secondary | ICD-10-CM | POA: Insufficient documentation

## 2019-05-04 DIAGNOSIS — I120 Hypertensive chronic kidney disease with stage 5 chronic kidney disease or end stage renal disease: Secondary | ICD-10-CM | POA: Insufficient documentation

## 2019-05-04 DIAGNOSIS — E1122 Type 2 diabetes mellitus with diabetic chronic kidney disease: Secondary | ICD-10-CM | POA: Insufficient documentation

## 2019-05-04 DIAGNOSIS — N186 End stage renal disease: Secondary | ICD-10-CM

## 2019-05-04 DIAGNOSIS — Z8616 Personal history of COVID-19: Secondary | ICD-10-CM | POA: Insufficient documentation

## 2019-05-04 DIAGNOSIS — Y841 Kidney dialysis as the cause of abnormal reaction of the patient, or of later complication, without mention of misadventure at the time of the procedure: Secondary | ICD-10-CM | POA: Insufficient documentation

## 2019-05-04 DIAGNOSIS — T82868A Thrombosis of vascular prosthetic devices, implants and grafts, initial encounter: Secondary | ICD-10-CM | POA: Insufficient documentation

## 2019-05-04 DIAGNOSIS — Z992 Dependence on renal dialysis: Secondary | ICD-10-CM | POA: Insufficient documentation

## 2019-05-04 DIAGNOSIS — I2510 Atherosclerotic heart disease of native coronary artery without angina pectoris: Secondary | ICD-10-CM | POA: Insufficient documentation

## 2019-05-04 DIAGNOSIS — E1151 Type 2 diabetes mellitus with diabetic peripheral angiopathy without gangrene: Secondary | ICD-10-CM | POA: Insufficient documentation

## 2019-05-04 DIAGNOSIS — Z794 Long term (current) use of insulin: Secondary | ICD-10-CM | POA: Insufficient documentation

## 2019-05-04 DIAGNOSIS — G4730 Sleep apnea, unspecified: Secondary | ICD-10-CM | POA: Insufficient documentation

## 2019-05-04 DIAGNOSIS — F17210 Nicotine dependence, cigarettes, uncomplicated: Secondary | ICD-10-CM | POA: Insufficient documentation

## 2019-05-04 DIAGNOSIS — Z79899 Other long term (current) drug therapy: Secondary | ICD-10-CM | POA: Insufficient documentation

## 2019-05-04 DIAGNOSIS — E7800 Pure hypercholesterolemia, unspecified: Secondary | ICD-10-CM | POA: Insufficient documentation

## 2019-05-04 HISTORY — PX: IR FLUORO GUIDE CV LINE RIGHT: IMG2283

## 2019-05-04 HISTORY — PX: IR US GUIDE VASC ACCESS RIGHT: IMG2390

## 2019-05-04 LAB — GLUCOSE, CAPILLARY
Glucose-Capillary: 64 mg/dL — ABNORMAL LOW (ref 70–99)
Glucose-Capillary: 75 mg/dL (ref 70–99)
Glucose-Capillary: 64 mg/dL — ABNORMAL LOW (ref 70–99)
Glucose-Capillary: 93 mg/dL (ref 70–99)

## 2019-05-04 MED ORDER — CEFAZOLIN SODIUM-DEXTROSE 2-4 GM/100ML-% IV SOLN: 2.0000 g | INTRAVENOUS | Status: DC

## 2019-05-04 MED ORDER — MIDAZOLAM HCL 2 MG/2ML IJ SOLN
INTRAMUSCULAR | Status: AC | PRN
  Administered 2019-05-04: 0.5 mg via INTRAVENOUS

## 2019-05-04 MED ORDER — HEPARIN SODIUM (PORCINE) 1000 UNIT/ML IJ SOLN
INTRAMUSCULAR | Status: AC
  Filled 2019-05-04: qty 1

## 2019-05-04 MED ORDER — LIDOCAINE HCL 1 % IJ SOLN
INTRAMUSCULAR | Status: AC
  Filled 2019-05-04: qty 20

## 2019-05-04 MED ORDER — GELATIN ABSORBABLE 12-7 MM EX MISC
CUTANEOUS | Status: AC
  Filled 2019-05-04: qty 1

## 2019-05-04 MED ORDER — HEPARIN SODIUM (PORCINE) 1000 UNIT/ML IJ SOLN
INTRAMUSCULAR | Status: AC | PRN
  Administered 2019-05-04: 3.2 mL via INTRAVENOUS

## 2019-05-04 MED ORDER — FENTANYL CITRATE (PF) 100 MCG/2ML IJ SOLN
INTRAMUSCULAR | Status: AC | PRN
  Administered 2019-05-04: 25 ug via INTRAVENOUS

## 2019-05-04 MED ORDER — FENTANYL CITRATE (PF) 100 MCG/2ML IJ SOLN
INTRAMUSCULAR | Status: AC
  Filled 2019-05-04: qty 2

## 2019-05-04 MED ORDER — SODIUM CHLORIDE 0.9 % IV SOLN: INTRAVENOUS | Status: DC

## 2019-05-04 MED ORDER — GLUCOSE 4 G PO CHEW
CHEWABLE_TABLET | ORAL | Status: AC
  Filled 2019-05-04: qty 1

## 2019-05-04 MED ORDER — CEFAZOLIN SODIUM-DEXTROSE 2-4 GM/100ML-% IV SOLN
INTRAVENOUS | Status: AC
  Filled 2019-05-04: qty 100

## 2019-05-04 MED ORDER — GLUCOSE 4 G PO CHEW
1.0000 | CHEWABLE_TABLET | Freq: Once | ORAL | Status: AC
  Administered 2019-05-04: 4 g via ORAL

## 2019-05-04 MED ORDER — MIDAZOLAM HCL 2 MG/2ML IJ SOLN
INTRAMUSCULAR | Status: AC
  Filled 2019-05-04: qty 2

## 2019-05-04 MED ORDER — LIDOCAINE HCL 1 % IJ SOLN
INTRAMUSCULAR | Status: AC | PRN
  Administered 2019-05-04: 10 mL

## 2019-05-04 NOTE — Sedation Documentation (Signed)
Vital signs stable. 

## 2019-05-04 NOTE — Sedation Documentation (Signed)
Pt snoring

## 2019-05-04 NOTE — Procedures (Signed)
Interventional Radiology Procedure Note  Procedure: RT IJ HD CATH  Complications: None  Estimated Blood Loss: MIN  Findings: TIP SVCRA     

## 2019-05-04 NOTE — Sedation Documentation (Signed)
Patient is resting comfortably. 

## 2019-05-04 NOTE — Sedation Documentation (Signed)
Pt tolerated procedure well. Report called to [facility] RN. Pt vitals stable

## 2019-05-04 NOTE — Sedation Documentation (Signed)
Pt in radiology nurses station at this time.Vitals are stable. Pt is drowsy but able to answer questions appropriately. Will move to IR when the suite is available.

## 2019-05-04 NOTE — Sedation Documentation (Signed)
Moved pt to ir suite and onto procedure table with no difficulties. Pt has no complaints at this time. VSS. Consent signed. Pt is oriented x4 and npo

## 2019-05-04 NOTE — Discharge Instructions (Signed)
Vascular Access for Hemodialysis        A vascular access is a connection to the blood inside your blood vessels that allows blood to be easily removed from your body and returned to your body during kidney dialysis (hemodialysis). Hemodialysis is a procedure in which a machine outside of the body filters the blood of a person whose kidneys are no longer working properly. There are three types of vascular accesses:  Arteriovenous fistula (AVF). This is a connection between an artery and a vein (usually in the arm) that is made by sewing them together. Blood in the artery flows directly into the vein, causing it to get larger over time. This makes it easier for the vein to be used for hemodialysis. An arteriovenous fistula takes 1-6 months to develop after surgery.  Arteriovenous graft (AVG). This is a connection between an artery and a vein in the arm that is made with a tube. An arteriovenous graft can be used within 2-3 weeks of surgery.  Venous catheter. This is a thin, flexible tube that is placed in a large vein (usually in the neck, chest, or groin). A venous catheter for hemodialysis contains two tubes that come out of the skin. A venous catheter can be used right away. It is usually used as a temporary access if you need hemodialysis before a fistula or graft has developed, or if kidney failure is sudden (acute) and likely to improve without the need for long-term dialysis. It may also be used as a permanent access if a fistula or graft cannot be created. Which type of access is best for me? The type of access that is best for you depends on the size and strength of your veins, your age, and any other health problems that you have, such as diabetes. An ultrasound test may be used to look at your veins to help make this decision. A fistula is usually the preferred type of access. It can last several years and is less likely than the other types of accesses to become infected or to cause a blood  clot within a blood vessel (thrombosis). However, a fistula is not an option for everyone. If your veins are not the right size or if the fistula does not develop properly, a graft may be used instead. Grafts require you to have strong veins. If your veins are not strong enough for a graft, a catheter may be used. Catheters are more likely than fistulas and grafts to become infected or to have a thrombosis. Sometimes, only one type of access is an option. Your health care provider will help you determine which type of access is best for you. How is a vascular access used? The way that the access is used depends on the type of access:  If the access is a fistula or graft, two needles are inserted through the skin into the access before each hemodialysis session. Blood leaves the body through one of the needles and travels through a tube to the hemodialysis machine (dialyzer). Then it flows through another tube and returns to the body through the second needle.  If the access is a catheter, one tube is connected directly to the tube that leads to the dialyzer, and the other tube is connected to a tube that leads away from the dialyzer. Blood leaves the body through one tube and returns to the body through the other. What problems can occur with vascular access?  A blood clot within a blood vessel (thrombosis).  Thrombosis can lead to a narrowing of a blood vessel (stenosis). If thrombosis occurs frequently, another access site may be created as a backup.  Infection.  Heart enlargement (cardiomegaly) and heart failure. Changes in blood flow may cause an increase in blood pressure or heart rate, making your heart work harder to pump blood. These problems are most likely to occur with a venous catheter and least likely to occur with an arteriovenous fistula. How do I care for my vascular access?  Wear a medical alert bracelet. In case of an emergency, this bracelet tells health care providers that you  are a dialysis patient and allows them to care for your veins appropriately. If you have a graft or fistula:  A "bruit" is a noise that is heard with a stethoscope, and a "thrill" is a vibration that is felt over the graft or fistula. The presence of the bruit and thrill indicates that the access is working. You will be taught to feel for the thrill each day. If this is not felt, the access may be clotted. Call your health care provider.  Keep your arm straight and raised (elevated) above your heart while the access site is healing.  You may freely use the arm where your vascular access is located after the site heals. Keep the following in mind: ? Avoid pressure on the arm. ? Avoid lifting heavy objects with the arm. ? Avoid sleeping on the arm. ? Avoid wearing tight-sleeved shirts or jewelry around the graft or fistula.  Do not allow blood pressure monitoring or needle punctures on the side where the graft or fistula is located.  With permission from your health care provider, you may do exercises to help with blood flow through a fistula. These exercises involve squeezing a rubber ball or other soft objects as instructed.  Wash your access site according to directions from your health care provider. If you have a venous catheter:  Keep the insertion site clean and dry at all times.  If you are told you can shower after the site heals, use a protective covering over the catheter to keep it dry.  Follow directions from your health care provider for bandage (dressing) changes.  Ask your health care provider what activities are safe for you. You may be restricted from lifting or making repetitive arm movements on the side with the catheter. Contact a health care provider if:  Swelling around the graft or fistula gets worse.  You develop new pain.  Your catheter gets damaged. Get help right away if:  You have pain, numbness, an unusual pale skin color, or blue fingers or sores at  the tips of your fingers in the hand on the side of your fistula.  You have chills.  You have a fever.  You have pus or other fluid (drainage) at the vascular access site.  You develop skin redness or red streaking on the skin around, above, or below the vascular access.  You have bleeding at the vascular access that cannot be easily controlled.  Your catheter gets pulled out of place.  You feel your heart racing or skipping beats.  You have chest pain. Summary  A vascular access is a connection to the blood inside your blood vessels that allows blood to be easily removed from your body and returned to your body during kidney dialysis (hemodialysis).  There are three types of vascular accesses.The type of access that is best for you depends on the size and strength of your  veins, your age, and any other health problems that you have, such as diabetes.  A fistula is usually the preferred type of access, although it is not an option for everyone. It can last several years and is less likely than the other types of accesses to become infected or to cause a blood clot within a blood vessel (thrombosis).  Wear a medical alert bracelet. In case of an emergency, this tells health care providers that you are a dialysis patient. This information is not intended to replace advice given to you by your health care provider. Make sure you discuss any questions you have with your health care provider. Document Revised: 05/09/2018 Document Reviewed: 02/13/2016 Elsevier Patient Education  [place].

## 2019-05-04 NOTE — H&P (Signed)
Chief Complaint: Patient was seen in consultation today for tunneled dialysis catheter placement at the request of [place]  Referring Physician(s): [place]  Supervising Physician: Daryll Brod  Patient Status: [facility] - Out-pt  History of Present Illness: [name] is a 54 y.o. female   ESRD Procedure: Right Upper Arm AV graft in OR with Dr Oneida Alar 03/12/19  Tunneled cath was removed 04/21/19  Now clotted graft again- just 2 mo after placement  Scheduled for tunneled dialysis catheter placement per Dr Katheren Puller    Past Medical History:  Diagnosis Date  . Anemia   . Anxiety   . Arthritis    "knees" "hands", "RA"  . CAD (coronary artery disease)    Nonobstructive on CT 2019  . COVID-19 10/2018  . Dyspnea    "when I have too much fluid."  . ESRD (end stage renal disease) ([place])    Dialysis TTHSat- 3rd   . Headache(784.0)   . Heart murmur   . High cholesterol   . History of blood transfusion   . Hypertension   . Mitral regurgitation    moderate to severe MR 10/2018 echo  . 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 Pericardial effusion   . Pneumonia    ; 11/16/2018- "touch of pneumonia" - seen in ED- 11/14/2018- on antibiotic  . PVD (peripheral vascular disease) ([place])    Right leg stent in [place].  (No records)  . Restless legs   . Sleep apnea    does not use Cpap  . Thoracic ascending aortic aneurysm (HCC)    4.4 cm 11/14/18 CTA  . Type II diabetes mellitus ([place])     Past Surgical History:  Procedure Laterality Date  . AMPUTATION Left 12/27/2018   Procedure: LEFT THUMB REVISION AMPUTATION DIGIT;  Surgeon: Charlotte Crumb, MD;  Location: [facility];  Service: Orthopedics;  Laterality: Left;  . AV FISTULA PLACEMENT Left   . AV FISTULA PLACEMENT Right 03/12/2019   Procedure: INSERTION OF ARTERIOVENOUS (AV) GORE-TEX GRAFT ARM;  Surgeon: Elam Dutch, MD;  Location: [facility];  Service: Vascular;  Laterality: Right;   . [place]; 1997   x 2  . COLONOSCOPY    . EYE SURGERY Bilateral    cataract surgery  . INSERTION OF DIALYSIS CATHETER Right 09/26/2018   Procedure: INSERTION OF DIALYSIS CATHETER Right Internal Jugular.;  Surgeon: Elam Dutch, MD;  Location: [facility];  Service: Vascular;  Laterality: Right;  . LIGATION OF ARTERIOVENOUS  FISTULA Left 09/26/2018   Procedure: LIGATION OF ARTERIOVENOUS  FISTULA LEFT ARM;  Surgeon: Elam Dutch, MD;  Location: [facility];  Service: Vascular;  Laterality: Left;  . PERICARDIAL WINDOW  02/2004   for pericardial effusion  . TUBAL LIGATION  05/1995    Allergies: Bupropion  Medications: Prior to Admission medications   Medication Sig Start Date End Date Taking? Authorizing Provider  AURYXIA 1 GM 210 MG(Fe) tablet Take 630-1,050 mg by mouth See admin instructions. Take 5 tablets (1050 mg) by mouth with each meal & take 3 tablets (630 mg) by mouth with each snack 05/29/18  Yes [provider]  CHANTIX STARTING MONTH PAK 0.5 MG X 11 & 1 MG X 42 tablet Take 1 mg by mouth 2 (two) times daily.  11/13/18  Yes [provider]  hydrOXYzine (ATARAX/VISTARIL) 25 MG tablet Take 25 mg by mouth every 12 (twelve) hours as needed for itching.  05/12/17  Yes [provider]  Insulin Lispro Prot & Lispro (HUMALOG MIX 75/25 KWIKPEN) (75-25) 100 UNIT/ML Kwikpen Inject 25 Units into the skin 2 (two) times daily with a meal.    Yes [provider]  metoprolol succinate (TOPROL-XL) 100 MG 24 hr tablet Take 100 mg by mouth daily. Take with or immediately following a meal.   Yes [provider]  Omega-3 1000 MG CAPS Take 1,000 mg by mouth daily.   Yes [provider]  Oxycodone HCl 10 MG TABS Take 10 mg by mouth 3 (three) times daily as needed.   Yes [provider]  pantoprazole (PROTONIX) 40 MG tablet Take 40 mg by mouth daily.   Yes [provider]  pramipexole (MIRAPEX) 0.5 MG tablet Take 0.5 mg by  mouth daily.  05/18/17  Yes [provider]  rosuvastatin (CRESTOR) 40 MG tablet Take 1 tablet (40 mg total) by mouth daily. 02/23/19 05/24/19 Yes Minus Breeding, MD  traZODone (DESYREL) 50 MG tablet Take 50 mg by mouth at bedtime as needed for sleep.  12/09/18  Yes [provider]  blood glucose meter kit and supplies KIT Dispense based on patient and insurance preference. Use up to four times daily as directed. (FOR ICD-9 250.00, 250.01). 10/25/18   Hongalgi, Lenis Dickinson, MD     Family History  Problem Relation Age of Onset  . Cancer Brother   . Heart disease Father        Died age 38  . Diabetes Father   . Hyperlipidemia Father   . Hypertension Father   . Stroke Father   . Diabetes Mother   . Hypertension Mother   . Stroke Mother   . Dementia Mother   . Diabetes Sister   . Diabetes Brother   . Hypertension Sister   . Hypertension Brother     Social History   Socioeconomic History  . Marital status: Married    Spouse name: Not on file  . Number of children: Not on file  . Years of education: Not on file  . Highest education level: Not on file  Occupational History  . Not on file  Tobacco Use  . Smoking status: Current Every Day Smoker    Packs/day: 0.25    Years: 33.00    Pack years: 8.25    Types: Cigarettes  . Smokeless tobacco: Never Used  . Tobacco comment: 3-4 cigs daily  Substance and Sexual Activity  . Alcohol use: No  . Drug use: No  . Sexual activity: Not Currently    Birth control/protection: Post-menopausal  Other Topics Concern  . Not on file  Social History Narrative   Lives with son.        Social Determinants of Health   Financial Resource Strain:   . Difficulty of Paying Living Expenses:   Food Insecurity:   . Worried About Charity fundraiser in the Last Year:   . Arboriculturist in the Last Year:   Transportation Needs:   . Film/video editor (Medical):   Marland Kitchen Lack of Transportation (Non-Medical):   Physical Activity:   .  Days of Exercise per Week:   . Minutes of Exercise per Session:   Stress:   . Feeling of Stress :   Social Connections:   . Frequency of Communication with Friends and Family:   . Frequency of Social Gatherings with Friends and Family:   . Attends Religious Services:   . Active Member of Clubs or Organizations:   .  Attends Archivist Meetings:   Marland Kitchen Marital Status:     Review of Systems: A 12 point ROS discussed and pertinent positives are indicated in the HPI above.  All other systems are negative.  Review of Systems  Constitutional: Positive for fatigue. Negative for activity change and fever.  Respiratory: Negative for cough and shortness of breath.   Cardiovascular: Negative for chest pain.  Gastrointestinal: Negative for abdominal pain.  Neurological: Negative for weakness.  Psychiatric/Behavioral: Negative for behavioral problems and confusion.    Vital Signs: BP (!) 167/69   Pulse 60   Temp 98.2 F (36.8 C)   Ht 5' 8"  (1.727 m)   Wt 245 lb (111.1 kg)   LMP  (LMP Unknown) Comment: LMP Feb 2011  SpO2 100%   BMI 37.25 kg/m   Physical Exam Vitals reviewed.  Cardiovascular:     Rate and Rhythm: Normal rate and regular rhythm.     Heart sounds: Normal heart sounds.  Pulmonary:     Effort: Pulmonary effort is normal.     Breath sounds: Normal breath sounds.  Abdominal:     Palpations: Abdomen is soft.  Musculoskeletal:        General: Normal range of motion.     Comments: Right upper arm graft- no thrill or pulses  Skin:    General: Skin is warm.  Neurological:     Mental Status: She is alert and oriented to person, place, and time.  Psychiatric:        Behavior: Behavior normal.        Thought Content: Thought content normal.        Judgment: Judgment normal.     Comments: sleepy     Imaging: No results found.  Labs:  CBC: Recent Labs    10/23/18 0950 10/23/18 1043 10/24/18 0527 10/24/18 0527 10/25/18 0738 10/25/18 0738 11/14/18 1424  12/15/18 0742 12/27/18 1031 03/12/19 0948  WBC 8.4  --  9.3  --  8.8  --  9.6  --   --   --   HGB 8.4*   < > 8.7*   < > 8.6*   < > 9.9* 11.9* 11.9* 12.9  HCT 26.6*   < > 28.0*   < > 27.8*   < > 31.5* 35.0* 35.0* 38.0  PLT 193  --  207  --  210  --  197  --   --   --    < > = values in this interval not displayed.    COAGS: Recent Labs    11/14/18 1424  INR 1.0    BMP: Recent Labs    10/23/18 0950 10/23/18 1043 10/24/18 0527 10/24/18 0527 10/25/18 0738 10/25/18 0738 11/14/18 1424 12/15/18 0742 12/27/18 1031 03/12/19 0948  NA 137   < > 134*   < > 131*   < > 137 136 136 135  K 4.4   < > 4.9   < > 4.3   < > 3.6 4.4 4.7 4.4  CL 95*   < > 96*   < > 90*   < > 93* 101 98 100  CO2 27  --  22  --  24  --  27  --   --   --   GLUCOSE 378*   < > 271*   < > 408*   < > 144* 224* 158* 108*  BUN 36*   < > 23*   < > 37*   < > 19 38*  36* 51*  CALCIUM 9.2  --  8.8*  --  9.3  --  8.9  --   --   --   CREATININE 6.50*   < > 4.72*   < > 5.24*   < > 3.95* 5.60* 4.90* 6.80*  GFRNONAA 7*  --  10*  --  9*  --  12*  --   --   --   GFRAA 8*  --  11*  --  10*  --  14*  --   --   --    < > = values in this interval not displayed.    LIVER FUNCTION TESTS: Recent Labs    09/26/18 1617 09/26/18 1617 09/27/18 0527 10/10/18 0003 10/10/18 1230 11/14/18 1424  BILITOT 0.8  --  0.6 0.9  --  1.0  AST 20  --  19 15  --  23  ALT 16  --  13 11  --  20  ALKPHOS 63  --  71 82  --  126  PROT 7.5  --  7.9 7.4  --  8.5*  ALBUMIN 2.8*   < > 2.8* 2.9* 3.0* 3.7   < > = values in this interval not displayed.    TUMOR MARKERS: No results for input(s): AFPTM, CEA, CA199, CHROMGRNA in the last 8760 hours.  Assessment and Plan:  Clotted Rt AV graft-- placed just 03/12/19 Scheduled for tunneled dialysis catheter placement Risks and benefits discussed with the patient including, but not limited to bleeding, infection, vascular injury, pneumothorax which may require chest tube placement, air embolism or even  death  All of the patient's questions were answered, patient is agreeable to proceed. Consent signed and in chart.  Thank you for this interesting consult.  I greatly enjoyed meeting [name] and look forward to participating in their care.  A copy of this report was sent to the requesting provider on this date.  Electronically Signed: Lavonia Drafts, PA-C 05/04/2019, 12:32 PM   I spent a total of  30 Minutes   in face to face in clinical consultation, greater than 50% of which was counseling/coordinating care for tunneled HD catheter placement

## 2019-05-16 ENCOUNTER — Telehealth: Payer: Self-pay | Admitting: *Deleted

## 2019-05-16 NOTE — Telephone Encounter (Signed)
Patient called and states her dialysis access is clotted off. She has a vas cath at this time she needs to be seen for evaluation of her clotted access. Per Dr Oneida Alar patient to be seen for evaluation. Phineas Real will schedule patient.

## 2019-05-17 ENCOUNTER — Ambulatory Visit: Payer: BLUE CROSS/BLUE SHIELD | Admitting: Vascular Surgery

## 2019-06-05 ENCOUNTER — Other Ambulatory Visit: Payer: Self-pay

## 2019-06-05 ENCOUNTER — Encounter (HOSPITAL_COMMUNITY): Payer: Self-pay | Admitting: Emergency Medicine

## 2019-06-05 ENCOUNTER — Emergency Department (HOSPITAL_COMMUNITY): Payer: Medicare Other

## 2019-06-05 ENCOUNTER — Emergency Department (HOSPITAL_COMMUNITY)
Admission: EM | Admit: 2019-06-05 | Discharge: 2019-06-05 | Disposition: A | Discharge status: Home / Self Care | Payer: Medicare Other | Attending: Emergency Medicine | Admitting: Emergency Medicine

## 2019-06-05 DIAGNOSIS — I120 Hypertensive chronic kidney disease with stage 5 chronic kidney disease or end stage renal disease: Secondary | ICD-10-CM | POA: Insufficient documentation

## 2019-06-05 DIAGNOSIS — Z794 Long term (current) use of insulin: Secondary | ICD-10-CM | POA: Insufficient documentation

## 2019-06-05 DIAGNOSIS — M25511 Pain in right shoulder: Secondary | ICD-10-CM | POA: Diagnosis present

## 2019-06-05 DIAGNOSIS — Z8616 Personal history of COVID-19: Secondary | ICD-10-CM | POA: Insufficient documentation

## 2019-06-05 DIAGNOSIS — Z992 Dependence on renal dialysis: Secondary | ICD-10-CM | POA: Diagnosis not present

## 2019-06-05 DIAGNOSIS — E1122 Type 2 diabetes mellitus with diabetic chronic kidney disease: Secondary | ICD-10-CM | POA: Insufficient documentation

## 2019-06-05 DIAGNOSIS — M19011 Primary osteoarthritis, right shoulder: Secondary | ICD-10-CM | POA: Insufficient documentation

## 2019-06-05 DIAGNOSIS — I2510 Atherosclerotic heart disease of native coronary artery without angina pectoris: Secondary | ICD-10-CM | POA: Insufficient documentation

## 2019-06-05 DIAGNOSIS — I739 Peripheral vascular disease, unspecified: Secondary | ICD-10-CM | POA: Insufficient documentation

## 2019-06-05 DIAGNOSIS — F17210 Nicotine dependence, cigarettes, uncomplicated: Secondary | ICD-10-CM | POA: Insufficient documentation

## 2019-06-05 DIAGNOSIS — N186 End stage renal disease: Secondary | ICD-10-CM | POA: Diagnosis not present

## 2019-06-05 LAB — CBC WITH DIFFERENTIAL/PLATELET
Abs Immature Granulocytes: 0.04 10*3/uL (ref 0.00–0.07)
Basophils Absolute: 0.1 10*3/uL (ref 0.0–0.1)
Basophils Relative: 1 %
Eosinophils Absolute: 0.3 10*3/uL (ref 0.0–0.5)
Eosinophils Relative: 3 %
HCT: 29.7 % — ABNORMAL LOW (ref 36.0–46.0)
Hemoglobin: 9.7 g/dL — ABNORMAL LOW (ref 12.0–15.0)
Immature Granulocytes: 0 %
Lymphocytes Relative: 18 %
Lymphs Abs: 1.8 10*3/uL (ref 0.7–4.0)
MCH: 33.2 pg (ref 26.0–34.0)
MCHC: 32.7 g/dL (ref 30.0–36.0)
MCV: 101.7 fL — ABNORMAL HIGH (ref 80.0–100.0)
Monocytes Absolute: 0.7 10*3/uL (ref 0.1–1.0)
Monocytes Relative: 7 %
Neutro Abs: 7.5 10*3/uL (ref 1.7–7.7)
Neutrophils Relative %: 71 %
Platelets: 204 10*3/uL (ref 150–400)
RBC: 2.92 MIL/uL — ABNORMAL LOW (ref 3.87–5.11)
RDW: 13.9 % (ref 11.5–15.5)
WBC: 10.5 10*3/uL (ref 4.0–10.5)
nRBC: 0 % (ref 0.0–0.2)

## 2019-06-05 LAB — BASIC METABOLIC PANEL
Anion gap: 15 (ref 5–15)
BUN: 35 mg/dL — ABNORMAL HIGH (ref 6–20)
CO2: 26 mmol/L (ref 22–32)
Calcium: 9 mg/dL (ref 8.9–10.3)
Chloride: 95 mmol/L — ABNORMAL LOW (ref 98–111)
Creatinine, Ser: 6.02 mg/dL — ABNORMAL HIGH (ref 0.44–1.00)
GFR calc Af Amer: 8 mL/min — ABNORMAL LOW (ref >60)
GFR calc non Af Amer: 7 mL/min — ABNORMAL LOW (ref >60)
Glucose, Bld: 241 mg/dL — ABNORMAL HIGH (ref 70–99)
Potassium: 4.5 mmol/L (ref 3.5–5.1)
Sodium: 136 mmol/L (ref 135–145)

## 2019-06-05 LAB — C-REACTIVE PROTEIN: CRP: 2.8 mg/dL — ABNORMAL HIGH (ref <1.0)

## 2019-06-05 MED ORDER — OXYCODONE-ACETAMINOPHEN 5-325 MG PO TABS
1.0000 | ORAL_TABLET | Freq: Once | ORAL | Status: AC
  Administered 2019-06-05: 1 via ORAL
  Filled 2019-06-05: qty 1

## 2019-06-05 MED ORDER — ACETAMINOPHEN 500 MG PO TABS
500.0000 mg | ORAL_TABLET | Freq: Three times a day (TID) | ORAL | 0 refills | Status: DC | PRN
Start: 2019-06-05 — End: 2019-08-13

## 2019-06-05 MED ORDER — LIDOCAINE 4 % EX PTCH
1.0000 | MEDICATED_PATCH | Freq: Two times a day (BID) | CUTANEOUS | 0 refills | Status: DC
Start: 2019-06-05 — End: 2019-08-13

## 2019-06-05 MED ORDER — LIDOCAINE 5 % EX PTCH
1.0000 | MEDICATED_PATCH | CUTANEOUS | Status: DC
  Administered 2019-06-05: 11:00:00 1 via TRANSDERMAL
  Filled 2019-06-05: qty 1

## 2019-06-05 MED ORDER — KETOROLAC TROMETHAMINE 30 MG/ML IJ SOLN
30.0000 mg | Freq: Once | INTRAMUSCULAR | Status: AC
  Administered 2019-06-05: 11:00:00 30 mg via INTRAMUSCULAR
  Filled 2019-06-05: qty 1

## 2019-06-05 NOTE — Discharge Instructions (Signed)
The x-ray shows that you have osteoarthritis. The treatment is conservative, and it takes a few days before the symptoms improve.  Given your dialysis history, treatment options are limited.  Continue taking your pain medications that are prescribed.  In addition take Tylenol every 8 hours for the next 3 days and apply the gel that has been prescribed.  We also recommend cryotherapy -with ice packs, 4 times a day for 10 to 15 minutes each.  See your primary care doctor in 1 week. Return to the ER if your symptoms get worse, or you start developing fevers, swelling or redness to your shoulder.

## 2019-06-05 NOTE — ED Triage Notes (Signed)
Pt arrives via [facility] from dialysis center with c/o R shoulder pain that began this morning. Had approx 20 mins of dialysis session today, also missed Saturday due to a "headache." pain worse with active ROM, reproduceable with palpation of shoulder. Denies any known injury or trauma.

## 2019-06-05 NOTE — ED Provider Notes (Addendum)
[facility] EMERGENCY DEPARTMENT Provider Note   CSN: [id number] Arrival date & time: 06/05/19  0725     History Chief Complaint  Patient presents with  . Shoulder Pain    [name] is a 54 y.o. female.  HPI     54 year old female comes in a chief complaint of shoulder pain. Patient has history of ESRD on HD, diabetes.  Patient started having pain in her shoulder yesterday morning around 4:00 in the morning.  Patient went to bed feeling fine.  She has no history of right-sided shoulder pain.  She reports that she woke up at 4 AM to get ready for dialysis and noted severe pain in her right shoulder.  The pain is described as sharp, stabbing pain.  She spasming up in her neck area as well.  The pain is mostly anterior in nature.  No associated nausea, vomiting, fevers, chills, swelling, redness.  Patient denies any prior history of joint infection.   Past Medical History:  Diagnosis Date  . Anemia   . Anxiety   . Arthritis    "knees" "hands", "RA"  . CAD (coronary artery disease)    Nonobstructive on CT 2019  . COVID-19 10/2018  . Dyspnea    "when I have too much fluid."  . ESRD (end stage renal disease) ([place])    Dialysis TTHSat- 3rd   . Headache(784.0)   . Heart murmur   . High cholesterol   . History of blood transfusion   . Hypertension   . Mitral regurgitation    moderate to severe MR 10/2018 echo  . 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 Pericardial effusion   . Pneumonia    ; 11/16/2018- "touch of pneumonia" - seen in ED- 11/14/2018- on antibiotic  . PVD (peripheral vascular disease) ([place])    Right leg stent in [place].  (No records)  . Restless legs   . Sleep apnea    does not use Cpap  . Thoracic ascending aortic aneurysm (HCC)    4.4 cm 11/14/18 CTA  . Type II diabetes mellitus [facility])     Patient Active Problem List   Diagnosis Date Noted  . Preop cardiovascular exam 12/19/2018  . Dyslipidemia  11/20/2018  . Shortness of breath 10/23/2018  . Acute respiratory distress 10/23/2018  . Chronic bilateral pleural effusions 10/23/2018  . Obesity 10/23/2018  . Coronary artery disease 10/23/2018  . Tobacco abuse 10/23/2018  . Volume overload 10/23/2018  . Pneumonia 10/10/2018  . COVID-19 virus detected 10/10/2018  . Acute encephalopathy 10/09/2018  . ESRD (end stage renal disease) ([place]) 09/27/2018  . Acute respiratory failure with hypoxia ([place]) 09/26/2018  . Chronic pain disorder 09/26/2018  . COVID-19 virus infection 09/18/2018  . Acute metabolic encephalopathy 22/48/2500  . Gangrene ([place]) 09/18/2018  . Left arm pain 06/27/2018  . Educated about COVID-19 virus infection 06/27/2018  . Anemia 09/26/2017  . Left hip pain 09/26/2017  . Neck pain 09/26/2017  . Leukocytosis 09/26/2017  . Chest pain 07/07/2017  . Left ventricular hypertrophy 06/17/2017  . ESRD on dialysis ([place]) 05/21/2011  . Diabetic retinopathy 05/21/2011  . Diastolic dysfunction 37/05/8887  . Hyperlipidemia 01/30/2011  . Type II diabetes mellitus with complication, uncontrolled ([place]) 01/29/2011  . Neuropathy 01/29/2011  . Hypertension     Past Surgical History:  Procedure Laterality Date  . AMPUTATION Left 12/27/2018   Procedure: LEFT THUMB REVISION AMPUTATION DIGIT;  Surgeon: Charlotte Crumb, MD;  Location: [facility];  Service: Orthopedics;  Laterality: Left;  . AV FISTULA PLACEMENT Left   . AV FISTULA PLACEMENT Right 03/12/2019   Procedure: INSERTION OF ARTERIOVENOUS (AV) GORE-TEX GRAFT ARM;  Surgeon: Elam Dutch, MD;  Location: [facility];  Service: Vascular;  Laterality: Right;  . [place]; 1997   x 2  . COLONOSCOPY    . EYE SURGERY Bilateral    cataract surgery  . INSERTION OF DIALYSIS CATHETER Right 09/26/2018   Procedure: INSERTION OF DIALYSIS CATHETER Right Internal Jugular.;  Surgeon: Elam Dutch, MD;  Location: [facility];  Service: Vascular;  Laterality: Right;  . IR FLUORO GUIDE CV  LINE RIGHT  05/04/2019  . IR US GUIDE VASC ACCESS RIGHT  05/04/2019  . LIGATION OF ARTERIOVENOUS  FISTULA Left 09/26/2018   Procedure: LIGATION OF ARTERIOVENOUS  FISTULA LEFT ARM;  Surgeon: Elam Dutch, MD;  Location: [facility];  Service: Vascular;  Laterality: Left;  . PERICARDIAL WINDOW  02/2004   for pericardial effusion  . TUBAL LIGATION  05/1995     OB History   No obstetric history on file.     Family History  Problem Relation Age of Onset  . Cancer Brother   . Heart disease Father        Died age 73  . Diabetes Father   . Hyperlipidemia Father   . Hypertension Father   . Stroke Father   . Diabetes Mother   . Hypertension Mother   . Stroke Mother   . Dementia Mother   . Diabetes Sister   . Diabetes Brother   . Hypertension Sister   . Hypertension Brother     Social History   Tobacco Use  . Smoking status: Current Every Day Smoker    Packs/day: 0.25    Years: 33.00    Pack years: 8.25    Types: Cigarettes  . Smokeless tobacco: Never Used  . Tobacco comment: 3-4 cigs daily  Substance Use Topics  . Alcohol use: No  . Drug use: No    Home Medications Prior to Admission medications   Medication Sig Start Date End Date Taking? Authorizing Provider  AURYXIA 1 GM 210 MG(Fe) tablet Take 630-1,050 mg by mouth See admin instructions. Take 5 tablets (1050 mg) by mouth with each meal & take 3 tablets (630 mg) by mouth with each snack 05/29/18  Yes [provider]  b complex-vitamin c-folic acid (NEPHRO-VITE) 0.8 MG TABS tablet Take 1 tablet by mouth daily. 04/12/19  Yes [provider]  hydrOXYzine (ATARAX/VISTARIL) 25 MG tablet Take 25 mg by mouth every 12 (twelve) hours as needed for itching.  05/12/17  Yes [provider]  Insulin Lispro Prot & Lispro (HUMALOG MIX 75/25 KWIKPEN) (75-25) 100 UNIT/ML Kwikpen Inject 30 Units into the skin 2 (two) times daily with a meal.    Yes [provider]  metoprolol succinate (TOPROL-XL) 100 MG 24 hr  tablet Take 100 mg by mouth daily. Take with or immediately following a meal.   Yes [provider]  Omega-3 1000 MG CAPS Take 1,000 mg by mouth daily.   Yes [provider]  Oxycodone HCl 10 MG TABS Take 10 mg by mouth 3 (three) times daily as needed.   Yes [provider]  pantoprazole (PROTONIX) 40 MG tablet Take 40 mg by mouth daily.   Yes [provider]  pramipexole (MIRAPEX) 0.5 MG tablet Take 0.5 mg by mouth daily.  05/18/17  Yes [provider]  rosuvastatin (CRESTOR) 40 MG tablet Take 1 tablet (40 mg total) by mouth daily. 02/23/19 06/05/19 Yes Minus Breeding, MD  traZODone (DESYREL) 50 MG tablet Take 50 mg by mouth at bedtime as needed for sleep.  12/09/18  Yes [provider]  acetaminophen (TYLENOL) 500 MG tablet Take 1 tablet (500 mg total) by mouth every 8 (eight) hours as needed. 06/05/19   Varney Biles, MD  blood glucose meter kit and supplies KIT Dispense based on patient and insurance preference. Use up to four times daily as directed. (FOR ICD-9 250.00, 250.01). 10/25/18   [place], Lenis Dickinson, MD  Lidocaine 4 % PTCH Apply 1 patch topically 2 (two) times daily. 06/05/19   Varney Biles, MD    Allergies    Bupropion  Review of Systems   Review of Systems  Constitutional: Positive for activity change. Negative for chills, diaphoresis and fever.  Respiratory: Negative for shortness of breath.   Cardiovascular: Negative for chest pain.  Gastrointestinal: Negative for nausea and vomiting.  Skin: Negative for rash.  Allergic/Immunologic: Positive for immunocompromised state.    Physical Exam Updated Vital Signs BP (!) 155/78   Pulse 73   Temp 98.6 F (37 C) (Oral)   Resp 18   Ht 5' 8"  (1.727 m)   Wt 108.9 kg   LMP  (LMP Unknown) Comment: LMP Feb 2011  SpO2 98%   BMI 36.49 kg/m   Physical Exam Vitals and nursing note reviewed.  Constitutional:      Appearance: She is well-developed.  HENT:     Head: Normocephalic  and atraumatic.  Cardiovascular:     Rate and Rhythm: Normal rate.  Pulmonary:     Effort: Pulmonary effort is normal.  Abdominal:     General: Bowel sounds are normal.  Musculoskeletal:        General: Swelling and tenderness present.     Cervical back: Normal range of motion and neck supple.     Comments: Patient has no deformity, edema, erythema or calor of the right shoulder.  She is having difficulty with active ranging. On passive range of motion patient tolerated abduction, internal rotation, external rotation.  She also tolerated forward flexion along with internal and external rotation while being forward flexed.  Skin:    General: Skin is warm and dry.  Neurological:     Mental Status: She is alert and oriented to person, place, and time.     ED Results / Procedures / Treatments   Labs (all labs ordered are listed, but only abnormal results are displayed) Labs Reviewed  CBC WITH DIFFERENTIAL/PLATELET - Abnormal; Notable for the following components:      Result Value   RBC 2.92 (*)    Hemoglobin 9.7 (*)    HCT 29.7 (*)    MCV 101.7 (*)    All other components within normal limits  BASIC METABOLIC PANEL - Abnormal; Notable for the following components:   Chloride 95 (*)    Glucose, Bld 241 (*)    BUN 35 (*)    Creatinine, Ser 6.02 (*)    GFR calc non Af Amer 7 (*)    GFR calc Af Amer 8 (*)    All other components within normal limits  C-REACTIVE PROTEIN - Abnormal; Notable for the following components:   CRP 2.8 (*)    All other components within normal limits    EKG None  Radiology DG Shoulder Right  Result Date: 06/05/2019 CLINICAL DATA:  Right-sided shoulder  pain. EXAM: RIGHT SHOULDER - 2+ VIEW COMPARISON:  Report from radiographs of the right shoulder 03/22/2018 (images unavailable) FINDINGS: There is no evidence of acute fracture or dislocation. Redemonstrated acromioclavicular and glenohumeral osteoarthrosis. A vascular stent/graft projects in the  region of the right axilla. Partially visualized right-sided central venous catheter. Vascular calcifications. IMPRESSION: No evidence of acute fracture or dislocation. Redemonstrated acromioclavicular and glenohumeral joint osteoarthrosis. Electronically Signed   By: Kellie Simmering DO   On: 06/05/2019 09:11    Procedures Procedures (including critical care time)  Medications Ordered in ED Medications  lidocaine (LIDODERM) 5 % 1 patch (1 patch Transdermal Patch Applied 06/05/19 1038)  oxyCODONE-acetaminophen (PERCOCET/ROXICET) 5-325 MG per tablet 1 tablet (1 tablet Oral Given 06/05/19 0831)  ketorolac (TORADOL) 30 MG/ML injection 30 mg (30 mg Intramuscular Given 06/05/19 1038)    ED Course  I have reviewed the triage vital signs and the nursing notes.  Pertinent labs & imaging results that were available during my care of the patient were reviewed by me and considered in my medical decision making (see chart for details).  Clinical Course as of Jun 05 1038  Tue Jun 05, 2019  1039 Lab work-up and CRP are reassuring.  X-ray is showing severe arthritis.  In the setting of x-ray showing arthritis, nature of the pain being sudden onset and severe, no signs of infection on exam, no white count or severely elevated CRP -suspicion for joint infection remains low.  We will treat conservatively and recommend PCP follow-up.  Patient has been advised to return to the ER if her symptoms get worse or if she starts developing fevers, chills.  Patient is in agreement with the plan.   [AN]    Clinical Course User Index [AN] Varney Biles, MD   MDM Rules/Calculators/A&P                      Patient comes in with chief complaint of shoulder pain.  Sudden onset shoulder pain starting 4 AM.  She has history of ESRD on HD and diabetes.  No infection-like constitutional's on history.  Patient denies any trauma, or prior history of shoulder pain.  Differential for sudden onset monoarticular pain is typically  musculoskeletal.  He considered septic arthritis in the differential, however there is no signs of infection on exam and patient is able to tolerate passive range of motion.   We will start with basic labs and reassess.  Final Clinical Impression(s) / ED Diagnoses Final diagnoses:  Acute pain of right shoulder  Primary osteoarthritis of right shoulder    Rx / DC Orders ED Discharge Orders         Ordered    Lidocaine 4 % PTCH  2 times daily     06/05/19 1036    acetaminophen (TYLENOL) 500 MG tablet  Every 8 hours PRN     06/05/19 [place], Emilya Justen, MD 06/05/19 1041

## 2019-06-05 NOTE — ED Notes (Signed)
Pt specifically asking for dilaudid-"do y'all still use dilaudid? I just need something to take the pain away right now!"

## 2019-06-07 ENCOUNTER — Telehealth: Payer: Self-pay | Admitting: *Deleted

## 2019-06-07 NOTE — Telephone Encounter (Signed)
Unable to leave a message, no voicemail.  

## 2019-06-11 ENCOUNTER — Telehealth: Payer: BLUE CROSS/BLUE SHIELD | Admitting: Cardiology

## 2019-06-13 ENCOUNTER — Telehealth (HOSPITAL_COMMUNITY): Payer: Self-pay

## 2019-06-13 NOTE — Telephone Encounter (Signed)

## 2019-06-14 ENCOUNTER — Telehealth: Payer: Self-pay

## 2019-06-14 ENCOUNTER — Other Ambulatory Visit: Payer: Self-pay

## 2019-06-14 ENCOUNTER — Telehealth: Payer: BLUE CROSS/BLUE SHIELD | Admitting: Cardiology

## 2019-06-14 ENCOUNTER — Encounter: Payer: Self-pay | Admitting: Vascular Surgery

## 2019-06-14 ENCOUNTER — Ambulatory Visit (INDEPENDENT_AMBULATORY_CARE_PROVIDER_SITE_OTHER): Payer: Medicare Other | Admitting: Vascular Surgery

## 2019-06-14 VITALS — BP 159/81 | HR 73 | Temp 97.8°F | Resp 20 | Ht 68.0 in | Wt 238.1 lb

## 2019-06-14 DIAGNOSIS — Z992 Dependence on renal dialysis: Secondary | ICD-10-CM

## 2019-06-14 DIAGNOSIS — N186 End stage renal disease: Secondary | ICD-10-CM

## 2019-06-14 NOTE — H&P (View-Only) (Signed)
Patient is a 54 year old female who presents today for further evaluation for hemodialysis access.  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  Most recently she had a right upper arm AV graft placed February 2021.  This apparently was declotted once at [facility].  It was then subsequently declotted again a few days later and then abandoned.  She currently is dialyzing by right sided catheter.  Her dialysis today is Tuesday Thursday Saturday.  Patient is obese and basically pickwickian and frequently falls asleep during mid sentences.  Past Medical History:  Diagnosis Date  . Anemia   . Anxiety   . Arthritis    "knees" "hands", "RA"  . CAD (coronary artery disease)    Nonobstructive on CT 2019  . COVID-19 10/2018  . Dyspnea    "when I have too much fluid."  . ESRD (end stage renal disease) ([place])    Dialysis TTHSat- 3rd   . Headache(784.0)   . Heart murmur   . High cholesterol   . History of blood transfusion   . Hypertension   . Mitral regurgitation    moderate to severe MR 10/2018 echo  . 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 Pericardial effusion   . Pneumonia    ; 11/16/2018- "touch of pneumonia" - seen in ED- 11/14/2018- on antibiotic  . PVD (peripheral vascular disease) ([place])    Right leg stent in [place].  (No records)  . Restless legs   . Sleep apnea    does not use Cpap  . Thoracic ascending aortic aneurysm (HCC)    4.4 cm 11/14/18 CTA  . Type II diabetes mellitus ([employer])     Past Surgical History:  Procedure Laterality Date  . AMPUTATION Left 12/27/2018   Procedure: LEFT THUMB REVISION AMPUTATION DIGIT;  Surgeon: Charlotte Crumb, MD;  Location: [facility];  Service: Orthopedics;  Laterality: Left;  . AV FISTULA PLACEMENT Left   . AV FISTULA PLACEMENT Right 03/12/2019   Procedure: INSERTION OF ARTERIOVENOUS (AV) GORE-TEX GRAFT ARM;  Surgeon:  Elam Dutch, MD;  Location: [facility];  Service: Vascular;  Laterality: Right;  . [place]; 1997   x 2  . COLONOSCOPY    . EYE SURGERY Bilateral    cataract surgery  . INSERTION OF DIALYSIS CATHETER Right 09/26/2018   Procedure: INSERTION OF DIALYSIS CATHETER Right Internal Jugular.;  Surgeon: Elam Dutch, MD;  Location: [facility];  Service: Vascular;  Laterality: Right;  . IR FLUORO GUIDE CV LINE RIGHT  05/04/2019  . IR US GUIDE VASC ACCESS RIGHT  05/04/2019  . LIGATION OF ARTERIOVENOUS  FISTULA Left 09/26/2018   Procedure: LIGATION OF ARTERIOVENOUS  FISTULA LEFT ARM;  Surgeon: Elam Dutch, MD;  Location: [facility];  Service: Vascular;  Laterality: Left;  . PERICARDIAL WINDOW  02/2004   for pericardial effusion  . TUBAL LIGATION  05/1995   Review of systems: She has no fever or chills.  She has no shortness of breath or chest pain.   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  She currently is dialyzing via right sided catheter.  A thigh graft is not going to be a very good option for her considering her obesity and large pannus and minimally palpable pulses.  Plan: Patient will be scheduled in the near future for a right central venogram to see if we can have room to put in another right upper arm AV graft.  However, this first graft only lasted about 3 months so a redo graft may not be very durable as well.  Discussed with patient that she may become catheter dependent in the very near future.  Her right central venogram scheduled for Jun 22, 2019.  Ruta Hinds, MD [facility] Office: [phone]

## 2019-06-14 NOTE — Telephone Encounter (Signed)
NOT NEEDED

## 2019-06-14 NOTE — Progress Notes (Signed)
Patient is a 54 year old female who presents today for further evaluation for hemodialysis access.  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  Most recently she had a right upper arm AV graft placed February 2021.  This apparently was declotted once at [facility].  It was then subsequently declotted again a few days later and then abandoned.  She currently is dialyzing by right sided catheter.  Her dialysis today is Tuesday Thursday Saturday.  Patient is obese and basically pickwickian and frequently falls asleep during mid sentences.  Past Medical History:  Diagnosis Date  . Anemia   . Anxiety   . Arthritis    "knees" "hands", "RA"  . CAD (coronary artery disease)    Nonobstructive on CT 2019  . COVID-19 10/2018  . Dyspnea    "when I have too much fluid."  . ESRD (end stage renal disease) ([place])    Dialysis TTHSat- 3rd   . Headache(784.0)   . Heart murmur   . High cholesterol   . History of blood transfusion   . Hypertension   . Mitral regurgitation    moderate to severe MR 10/2018 echo  . 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 Pericardial effusion   . Pneumonia    ; 11/16/2018- "touch of pneumonia" - seen in ED- 11/14/2018- on antibiotic  . PVD (peripheral vascular disease) ([place])    Right leg stent in [place].  (No records)  . Restless legs   . Sleep apnea    does not use Cpap  . Thoracic ascending aortic aneurysm (HCC)    4.4 cm 11/14/18 CTA  . Type II diabetes mellitus ([place])     Past Surgical History:  Procedure Laterality Date  . AMPUTATION Left 12/27/2018   Procedure: LEFT THUMB REVISION AMPUTATION DIGIT;  Surgeon: Charlotte Crumb, MD;  Location: [facility];  Service: Orthopedics;  Laterality: Left;  . AV FISTULA PLACEMENT Left   . AV FISTULA PLACEMENT Right 03/12/2019   Procedure: INSERTION OF ARTERIOVENOUS (AV) GORE-TEX GRAFT ARM;  Surgeon:  Elam Dutch, MD;  Location: [facility];  Service: Vascular;  Laterality: Right;  . [place]; 1997   x 2  . COLONOSCOPY    . EYE SURGERY Bilateral    cataract surgery  . INSERTION OF DIALYSIS CATHETER Right 09/26/2018   Procedure: INSERTION OF DIALYSIS CATHETER Right Internal Jugular.;  Surgeon: Elam Dutch, MD;  Location: [facility];  Service: Vascular;  Laterality: Right;  . IR FLUORO GUIDE CV LINE RIGHT  05/04/2019  . IR US GUIDE VASC ACCESS RIGHT  05/04/2019  . LIGATION OF ARTERIOVENOUS  FISTULA Left 09/26/2018   Procedure: LIGATION OF ARTERIOVENOUS  FISTULA LEFT ARM;  Surgeon: Elam Dutch, MD;  Location: [facility];  Service: Vascular;  Laterality: Left;  . PERICARDIAL WINDOW  02/2004   for pericardial effusion  . TUBAL LIGATION  05/1995   Review of systems: She has no fever or chills.  She has no shortness of breath or chest pain.   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  She currently is dialyzing via right sided catheter.  A thigh graft is not going to be a very good option for her considering her obesity and large pannus and minimally palpable pulses.  Plan: Patient will be scheduled in the near future for a right central venogram to see if we can have room to put in another right upper arm AV graft.  However, this first graft only lasted about 3 months so a redo graft may not be very durable as well.  Discussed with patient that she may become catheter dependent in the very near future.  Her right central venogram scheduled for Jun 22, 2019.  Ruta Hinds, MD [facility] Office: [phone]

## 2019-06-15 ENCOUNTER — Other Ambulatory Visit: Payer: Self-pay

## 2019-06-20 ENCOUNTER — Other Ambulatory Visit (HOSPITAL_COMMUNITY)
Admission: RE | Admit: 2019-06-20 | Discharge: 2019-06-20 | Disposition: A | Discharge status: Home / Self Care | Payer: Medicare Other | Source: Ambulatory Visit | Attending: Vascular Surgery | Admitting: Vascular Surgery

## 2019-06-20 DIAGNOSIS — Z20822 Contact with and (suspected) exposure to covid-19: Secondary | ICD-10-CM | POA: Diagnosis not present

## 2019-06-20 DIAGNOSIS — Z01812 Encounter for preprocedural laboratory examination: Secondary | ICD-10-CM | POA: Diagnosis present

## 2019-06-21 LAB — SARS CORONAVIRUS 2 (TAT 6-24 HRS): SARS Coronavirus 2: NEGATIVE

## 2019-06-22 ENCOUNTER — Ambulatory Visit (HOSPITAL_COMMUNITY)
Admission: RE | Admit: 2019-06-22 | Discharge: 2019-06-22 | Disposition: A | Discharge status: Home / Self Care | Payer: Medicare Other | Source: Ambulatory Visit | Attending: Vascular Surgery | Admitting: Vascular Surgery

## 2019-06-22 ENCOUNTER — Other Ambulatory Visit: Payer: Self-pay

## 2019-06-22 ENCOUNTER — Encounter (HOSPITAL_COMMUNITY)
Admission: RE | Disposition: A | Discharge status: Home / Self Care | Payer: Self-pay | Source: Ambulatory Visit | Attending: Vascular Surgery

## 2019-06-22 DIAGNOSIS — G4730 Sleep apnea, unspecified: Secondary | ICD-10-CM | POA: Diagnosis not present

## 2019-06-22 DIAGNOSIS — F419 Anxiety disorder, unspecified: Secondary | ICD-10-CM | POA: Diagnosis not present

## 2019-06-22 DIAGNOSIS — E7800 Pure hypercholesterolemia, unspecified: Secondary | ICD-10-CM | POA: Diagnosis not present

## 2019-06-22 DIAGNOSIS — I120 Hypertensive chronic kidney disease with stage 5 chronic kidney disease or end stage renal disease: Secondary | ICD-10-CM | POA: Diagnosis present

## 2019-06-22 DIAGNOSIS — E1122 Type 2 diabetes mellitus with diabetic chronic kidney disease: Secondary | ICD-10-CM | POA: Insufficient documentation

## 2019-06-22 DIAGNOSIS — Z6835 Body mass index (BMI) 35.0-35.9, adult: Secondary | ICD-10-CM | POA: Insufficient documentation

## 2019-06-22 DIAGNOSIS — N186 End stage renal disease: Secondary | ICD-10-CM | POA: Insufficient documentation

## 2019-06-22 DIAGNOSIS — I340 Nonrheumatic mitral (valve) insufficiency: Secondary | ICD-10-CM | POA: Insufficient documentation

## 2019-06-22 DIAGNOSIS — I2510 Atherosclerotic heart disease of native coronary artery without angina pectoris: Secondary | ICD-10-CM | POA: Insufficient documentation

## 2019-06-22 DIAGNOSIS — M069 Rheumatoid arthritis, unspecified: Secondary | ICD-10-CM | POA: Insufficient documentation

## 2019-06-22 DIAGNOSIS — D649 Anemia, unspecified: Secondary | ICD-10-CM | POA: Insufficient documentation

## 2019-06-22 DIAGNOSIS — I714 Abdominal aortic aneurysm, without rupture: Secondary | ICD-10-CM | POA: Diagnosis not present

## 2019-06-22 DIAGNOSIS — G2581 Restless legs syndrome: Secondary | ICD-10-CM | POA: Insufficient documentation

## 2019-06-22 DIAGNOSIS — Z992 Dependence on renal dialysis: Secondary | ICD-10-CM | POA: Insufficient documentation

## 2019-06-22 DIAGNOSIS — E669 Obesity, unspecified: Secondary | ICD-10-CM | POA: Diagnosis not present

## 2019-06-22 DIAGNOSIS — E1151 Type 2 diabetes mellitus with diabetic peripheral angiopathy without gangrene: Secondary | ICD-10-CM | POA: Diagnosis not present

## 2019-06-22 DIAGNOSIS — Z8616 Personal history of COVID-19: Secondary | ICD-10-CM | POA: Insufficient documentation

## 2019-06-22 HISTORY — PX: UPPER EXTREMITY VENOGRAPHY: CATH118272

## 2019-06-22 LAB — POCT I-STAT, CHEM 8
BUN: 41 mg/dL — ABNORMAL HIGH (ref 6–20)
Calcium, Ion: 1 mmol/L — ABNORMAL LOW (ref 1.15–1.40)
Chloride: 99 mmol/L (ref 98–111)
Creatinine, Ser: 5.8 mg/dL — ABNORMAL HIGH (ref 0.44–1.00)
Glucose, Bld: 164 mg/dL — ABNORMAL HIGH (ref 70–99)
HCT: 31 % — ABNORMAL LOW (ref 36.0–46.0)
Hemoglobin: 10.5 g/dL — ABNORMAL LOW (ref 12.0–15.0)
Potassium: 5.5 mmol/L — ABNORMAL HIGH (ref 3.5–5.1)
Sodium: 134 mmol/L — ABNORMAL LOW (ref 135–145)
TCO2: 32 mmol/L (ref 22–32)

## 2019-06-22 SURGERY — UPPER EXTREMITY VENOGRAPHY
Anesthesia: LOCAL

## 2019-06-22 MED ORDER — SODIUM CHLORIDE 0.9% FLUSH: 3.0000 mL | INTRAVENOUS | Status: DC | PRN

## 2019-06-22 MED ORDER — SODIUM CHLORIDE 0.9% FLUSH: 3.0000 mL | Freq: Two times a day (BID) | INTRAVENOUS | Status: DC

## 2019-06-22 MED ORDER — ACETAMINOPHEN 325 MG PO TABS: 650.0000 mg | ORAL_TABLET | ORAL | Status: DC | PRN

## 2019-06-22 MED ORDER — SODIUM CHLORIDE 0.9 % IV SOLN: 250.0000 mL | INTRAVENOUS | Status: DC | PRN

## 2019-06-22 MED ORDER — HYDRALAZINE HCL 20 MG/ML IJ SOLN: 5.0000 mg | INTRAMUSCULAR | Status: DC | PRN

## 2019-06-22 MED ORDER — ONDANSETRON HCL 4 MG/2ML IJ SOLN: 4.0000 mg | Freq: Four times a day (QID) | INTRAMUSCULAR | Status: DC | PRN

## 2019-06-22 MED ORDER — LABETALOL HCL 5 MG/ML IV SOLN: 10.0000 mg | INTRAVENOUS | Status: DC | PRN

## 2019-06-22 MED ORDER — IODIXANOL 320 MG/ML IV SOLN
INTRAVENOUS | Status: DC | PRN
  Administered 2019-06-22: 18 mL via INTRAVENOUS

## 2019-06-22 SURGICAL SUPPLY — 5 items
KIT PV (KITS) ×2 IMPLANT
STOPCOCK MORSE 400PSI 3WAY (MISCELLANEOUS) ×1 IMPLANT
SYR MEDRAD MARK 7 150ML (SYRINGE) ×2 IMPLANT
TRANSDUCER W/STOPCOCK (MISCELLANEOUS) ×2 IMPLANT
TRAY PV CATH (CUSTOM PROCEDURE TRAY) ×2 IMPLANT

## 2019-06-22 NOTE — Interval H&P Note (Signed)
History and Physical Interval Note:  06/22/2019 10:05 AM  [name]  has presented today for surgery, with the diagnosis of instage renal.  The various methods of treatment have been discussed with the patient and family. After consideration of risks, benefits and other options for treatment, the patient has consented to  Procedure(s): UPPER EXTREMITY VENOGRAPHY - Right Central (N/A) as a surgical intervention.  The patient's history has been reviewed, patient examined, no change in status, stable for surgery.  I have reviewed the patient's chart and labs.  Questions were answered to the patient's satisfaction.     Ruta Hinds

## 2019-06-22 NOTE — Discharge Instructions (Signed)
Venogram A venogram, or venography, is a procedure that uses an X-ray and dye (contrast) to examine how well the veins work and how blood flows through them. Contrast helps the veins show up on X-rays. A venogram may be done:  To evaluate abnormalities in the vein.  To identify clots within veins, such as deep vein thrombosis (DVT).  To map out the veins that might be needed for another procedure. Tell a health care provider about:  Any allergies you have, especially to medicines, shellfish, iodine, and contrast.  All medicines you are taking, including vitamins, herbs, eye drops, creams, and over-the-counter medicines.  Any problems you or family members have had with anesthetic medicines.  Any blood disorders you have.  Any surgeries you have had and any complications that occurred.  Any medical conditions you have.  Whether you are pregnant, may be pregnant, or are breastfeeding.  Any history of smoking or tobacco use. What are the risks? Generally, this is a safe procedure. However, problems may occur, including:  Infection.  Bleeding.  Blood clots.  Allergic reaction to medicines or contrast.  Damage to other structures or organs.  Kidney problems.  Increased risk of cancer. Being exposed to too much radiation over a lifetime can increase the risk of cancer. The risk is small. What happens before the procedure? Medicines Ask your health care provider about:  Changing or stopping your regular medicines. This is especially important if you are taking diabetes medicines or blood thinners.  Taking medicines such as aspirin and ibuprofen. These medicines can thin your blood. Do not take these medicines unless your health care provider tells you to take them.  Taking over-the-counter medicines, vitamins, herbs, and supplements. General instructions  Follow instructions from your health care provider about eating or drinking restrictions.  You may have blood tests  to check how well your kidneys and liver are working and how well your blood can clot.  Plan to have someone take you home from the hospital or clinic. What happens during the procedure?   An IV will be inserted into one of your veins.  You may be given a medicine to help you relax (sedative).  You will lie down on an X-ray table. The table may be tilted in different directions during the procedure to help the contrast move throughout your body. Safety straps will keep you secure if the table is tilted.  If veins in your arm or leg will be examined, a band may be wrapped around that arm or leg to keep the veins full of blood. This may cause your arm or leg to feel numb.  The contrast will be injected into your IV. You may have a hot, flushed feeling as it moves throughout your body. You may also have a metallic taste in your mouth. Both of these sensations will go away after the test is complete.  You may be asked to lie in different positions or place your legs or arms in different positions.  At the end of the procedure, you may be given IV fluids to help wash or flush the contrast out of your veins.  The IV will be removed, and pressure will be applied to the IV site to prevent bleeding. A bandage (dressing) may be applied to the IV site. The exact procedure may vary among health care providers and hospitals. What can I expect after the procedure?  Your blood pressure, heart rate, breathing rate, and blood oxygen level will be monitored until you   leave the hospital or clinic.  You may be given something to eat and drink.  You may have bruising or mild discomfort in the area where the IV was inserted. Follow these instructions at home: Eating and drinking   Follow instructions from your health care provider about eating or drinking restrictions.  Drink a lot of water for the first several days after the procedure, as directed by your health care provider. This helps to flush the  contrast out of your body. Activity  Rest as told by your health care provider.  Return to your normal activities as told by your health care provider. Ask your health care provider what activities are safe for you.  If you were given a sedative during your procedure, do not drive for 24 hours or until your health care provider approves. General instructions  Check your IV insertion area every day for signs of infection. Check for: ? Redness, swelling, or pain. ? Fluid or blood. ? Warmth. ? Pus or a bad smell.  Take over-the-counter and prescription medicines only as told by your health care provider.  Keep all follow-up visits as told by your health care provider. This is important. Contact a health care provider if:  Your skin becomes itchy or you develop a rash or hives.  You have a fever that does not get better with medicine.  You feel nauseous or you vomit.  You have redness, swelling, or pain around the insertion site.  You have fluid or blood coming from the insertion site.  Your insertion area feels warm to the touch.  You have pus or a bad smell coming from the insertion site. Get help right away if you:  Have shortness of breath or difficulty breathing.  Develop chest pain.  Faint.  Feel very dizzy. These symptoms may represent a serious problem that is an emergency. Do not wait to see if the symptoms will go away. Get medical help right away. Call your local emergency services (911 in the [place] Do not drive yourself to the hospital. Summary  A venogram, or venography, is a procedure that uses an X-ray and contrast dye to check how well the veins work and how blood flows through them.  An IV will be inserted into one of your veins in order to inject the contrast.  During the exam, you will lie on an X-ray table. The table may be tilted in different directions during the procedure to help the contrast move throughout your body. Safety straps will keep you  secure.  After the procedure, you will need to drink a lot of water to help wash or flush the contrast out of your body. This information is not intended to replace advice given to you by your health care provider. Make sure you discuss any questions you have with your health care provider. Document Revised: 08/26/2018 Document Reviewed: 08/26/2018 Elsevier Patient Education  2020 [employer]  

## 2019-06-22 NOTE — Op Note (Addendum)
Procedure: Right central venogram  Preoperative diagnosis: End-stage renal disease  Postoperative diagnosis: Same  Anesthetic: None  Operative findings: Patent deep brachial versus basilic vein and patent central veins right side  Stent mid right upper arm and what appears to be either the basilic or deep brachial system but it is in a different vein then the patent vein  Operative details: After pain informed consent, patient taken the PV lab.  Patient placed in supine position angio table.  An IV had been started in the preop holding area.  This was accessed and contrast injected through this to take multiple images of the right upper extremity and right chest.  The right innominate subclavian axillary veins are all patent.  There is one patent vein in the right upper extremity which crosses the antecubital area and then dumps eventually into the deep system.  This is either a deep brachial or basilic vein.  It appears to be of reasonable diameter for creation of a fistula.  At this point the procedure was concluded.  The patient tired procedure well and there were no complications.  Patient was taken to the holding area in stable condition.  Operative management: Patient be scheduled in the near future for an attempt at an new fistula utilizing this 1 remaining vein in her right upper extremity.  This will need to be done with general anesthesia.  Ruta Hinds, MD [facility] Office: [phone]

## 2019-06-22 NOTE — Progress Notes (Signed)
Client sleepy and states did not sleep last night; if not talking to client she falls asleep and needs tactile stimuli to wake up

## 2019-06-25 ENCOUNTER — Other Ambulatory Visit: Payer: Self-pay

## 2019-06-28 ENCOUNTER — Emergency Department (HOSPITAL_COMMUNITY)
Admission: EM | Admit: 2019-06-28 | Discharge: 2019-06-28 | Discharge status: Against Medical Advice | Payer: Medicare Other

## 2019-06-28 NOTE — ED Notes (Signed)
Pt states she is leaving ED.

## 2019-06-28 NOTE — ED Notes (Signed)
Pt complaining of lft arm pain radiating to chest.  Pt taken to triage for ekg. RN infnormed

## 2019-07-10 ENCOUNTER — Telehealth: Payer: Self-pay | Admitting: Hematology

## 2019-07-10 NOTE — Telephone Encounter (Signed)
Received a new patient referral from Dr. Reeves Dam at [facility] for splenic lesions, enlarged spleen, and lymphadenopathy and cannot rule out lymphoma. Ms. [name] has been cld and scheduled to see Dr. Irene [name] on 6/14 at 11am. Pt aware to arrive 15 minutes early.

## 2019-07-16 ENCOUNTER — Telehealth: Payer: Self-pay | Admitting: Hematology

## 2019-07-16 ENCOUNTER — Encounter (HOSPITAL_COMMUNITY): Payer: Self-pay | Admitting: Vascular Surgery

## 2019-07-16 ENCOUNTER — Inpatient Hospital Stay: Payer: Medicare Other | Attending: Hematology | Admitting: Hematology

## 2019-07-16 ENCOUNTER — Other Ambulatory Visit: Payer: Self-pay

## 2019-07-16 ENCOUNTER — Inpatient Hospital Stay: Payer: Medicare Other

## 2019-07-16 VITALS — BP 180/78 | HR 67 | Temp 97.5°F | Resp 18

## 2019-07-16 DIAGNOSIS — I2510 Atherosclerotic heart disease of native coronary artery without angina pectoris: Secondary | ICD-10-CM | POA: Insufficient documentation

## 2019-07-16 DIAGNOSIS — N186 End stage renal disease: Secondary | ICD-10-CM | POA: Diagnosis not present

## 2019-07-16 DIAGNOSIS — E7800 Pure hypercholesterolemia, unspecified: Secondary | ICD-10-CM | POA: Insufficient documentation

## 2019-07-16 DIAGNOSIS — R161 Splenomegaly, not elsewhere classified: Secondary | ICD-10-CM | POA: Insufficient documentation

## 2019-07-16 DIAGNOSIS — R599 Enlarged lymph nodes, unspecified: Secondary | ICD-10-CM

## 2019-07-16 DIAGNOSIS — Z794 Long term (current) use of insulin: Secondary | ICD-10-CM | POA: Insufficient documentation

## 2019-07-16 DIAGNOSIS — Z8616 Personal history of COVID-19: Secondary | ICD-10-CM | POA: Insufficient documentation

## 2019-07-16 DIAGNOSIS — K439 Ventral hernia without obstruction or gangrene: Secondary | ICD-10-CM | POA: Diagnosis not present

## 2019-07-16 DIAGNOSIS — I700 Atherosclerosis of aorta: Secondary | ICD-10-CM | POA: Diagnosis not present

## 2019-07-16 DIAGNOSIS — Z992 Dependence on renal dialysis: Secondary | ICD-10-CM | POA: Insufficient documentation

## 2019-07-16 DIAGNOSIS — I712 Thoracic aortic aneurysm, without rupture: Secondary | ICD-10-CM | POA: Insufficient documentation

## 2019-07-16 DIAGNOSIS — M1990 Unspecified osteoarthritis, unspecified site: Secondary | ICD-10-CM | POA: Diagnosis not present

## 2019-07-16 DIAGNOSIS — G2581 Restless legs syndrome: Secondary | ICD-10-CM | POA: Insufficient documentation

## 2019-07-16 DIAGNOSIS — R591 Generalized enlarged lymph nodes: Secondary | ICD-10-CM | POA: Insufficient documentation

## 2019-07-16 DIAGNOSIS — Z79899 Other long term (current) drug therapy: Secondary | ICD-10-CM | POA: Diagnosis not present

## 2019-07-16 DIAGNOSIS — M069 Rheumatoid arthritis, unspecified: Secondary | ICD-10-CM | POA: Insufficient documentation

## 2019-07-16 DIAGNOSIS — I340 Nonrheumatic mitral (valve) insufficiency: Secondary | ICD-10-CM | POA: Insufficient documentation

## 2019-07-16 DIAGNOSIS — E1122 Type 2 diabetes mellitus with diabetic chronic kidney disease: Secondary | ICD-10-CM | POA: Diagnosis not present

## 2019-07-16 DIAGNOSIS — R918 Other nonspecific abnormal finding of lung field: Secondary | ICD-10-CM | POA: Diagnosis not present

## 2019-07-16 DIAGNOSIS — E1151 Type 2 diabetes mellitus with diabetic peripheral angiopathy without gangrene: Secondary | ICD-10-CM | POA: Insufficient documentation

## 2019-07-16 DIAGNOSIS — F419 Anxiety disorder, unspecified: Secondary | ICD-10-CM | POA: Diagnosis not present

## 2019-07-16 DIAGNOSIS — F17210 Nicotine dependence, cigarettes, uncomplicated: Secondary | ICD-10-CM | POA: Diagnosis not present

## 2019-07-16 DIAGNOSIS — I120 Hypertensive chronic kidney disease with stage 5 chronic kidney disease or end stage renal disease: Secondary | ICD-10-CM | POA: Diagnosis not present

## 2019-07-16 LAB — CMP (CANCER CENTER ONLY)
ALT: 20 U/L (ref 0–44)
AST: 15 U/L (ref 15–41)
Albumin: 3.5 g/dL (ref 3.5–5.0)
Alkaline Phosphatase: 110 U/L (ref 38–126)
Anion gap: 18 — ABNORMAL HIGH (ref 5–15)
BUN: 55 mg/dL — ABNORMAL HIGH (ref 6–20)
CO2: 25 mmol/L (ref 22–32)
Calcium: 9.7 mg/dL (ref 8.9–10.3)
Chloride: 93 mmol/L — ABNORMAL LOW (ref 98–111)
Creatinine: 7.07 mg/dL (ref 0.44–1.00)
GFR, Est AFR Am: 7 mL/min — ABNORMAL LOW (ref >60)
GFR, Estimated: 6 mL/min — ABNORMAL LOW (ref >60)
Glucose, Bld: 356 mg/dL — ABNORMAL HIGH (ref 70–99)
Potassium: 4.2 mmol/L (ref 3.5–5.1)
Sodium: 136 mmol/L (ref 135–145)
Total Bilirubin: 0.4 mg/dL (ref 0.3–1.2)
Total Protein: 8 g/dL (ref 6.5–8.1)

## 2019-07-16 LAB — CBC WITH DIFFERENTIAL/PLATELET
Abs Immature Granulocytes: 0.03 10*3/uL (ref 0.00–0.07)
Basophils Absolute: 0 10*3/uL (ref 0.0–0.1)
Basophils Relative: 1 %
Eosinophils Absolute: 0.4 10*3/uL (ref 0.0–0.5)
Eosinophils Relative: 6 %
HCT: 29.8 % — ABNORMAL LOW (ref 36.0–46.0)
Hemoglobin: 10.2 g/dL — ABNORMAL LOW (ref 12.0–15.0)
Immature Granulocytes: 1 %
Lymphocytes Relative: 22 %
Lymphs Abs: 1.4 10*3/uL (ref 0.7–4.0)
MCH: 32.9 pg (ref 26.0–34.0)
MCHC: 34.2 g/dL (ref 30.0–36.0)
MCV: 96.1 fL (ref 80.0–100.0)
Monocytes Absolute: 0.4 10*3/uL (ref 0.1–1.0)
Monocytes Relative: 6 %
Neutro Abs: 4.2 10*3/uL (ref 1.7–7.7)
Neutrophils Relative %: 64 %
Platelets: 181 10*3/uL (ref 150–400)
RBC: 3.1 MIL/uL — ABNORMAL LOW (ref 3.87–5.11)
RDW: 13.3 % (ref 11.5–15.5)
WBC: 6.4 10*3/uL (ref 4.0–10.5)
nRBC: 0 % (ref 0.0–0.2)

## 2019-07-16 LAB — LACTATE DEHYDROGENASE: LDH: 222 U/L — ABNORMAL HIGH (ref 98–192)

## 2019-07-16 LAB — FERRITIN: Ferritin: 1553 ng/mL — ABNORMAL HIGH (ref 11–307)

## 2019-07-16 LAB — SURGICAL PATHOLOGY

## 2019-07-16 LAB — SEDIMENTATION RATE: Sed Rate: 106 mm/hr — ABNORMAL HIGH (ref 0–22)

## 2019-07-16 NOTE — Telephone Encounter (Signed)
Scheduled per 6/14 los. Printed avs and calendar for pt.  

## 2019-07-16 NOTE — Patient Instructions (Signed)
Thank you for choosing [facility] to provide your oncology and hematology care.   Should you have questions after your visit to the [facility] ([facility]), please contact this office at [phone] between 8:30 AM and 4:30 PM.  Voice mails left after 4:00 PM may not be returned until the following business day.  Calls received after 4:30 PM will be answered by an off-site Nurse Triage Line.    Prescription Refills:  Please have your pharmacy contact us directly for most prescription requests.  Contact the office directly for refills of narcotics (pain medications). Allow 48-72 hours for refills.  Appointments: Please contact the [facility] scheduling department [phone] for questions regarding [facility] appointment scheduling.  Contact the schedulers with any scheduling changes so that your appointment can be rescheduled in a timely manner.   Central Scheduling for [place] [phone] - Call to schedule procedures such as PET scans, CT scans, MRI, Ultrasound, etc.  To afford each patient quality time with our providers, please arrive 30 minutes before your scheduled appointment time.  If you arrive late for your appointment, you may be asked to reschedule.  We strive to give you quality time with our providers, and arriving late affects you and other patients whose appointments are after yours. If you are a no show for multiple scheduled visits, you may be dismissed from the clinic at the providers discretion.     Resources: [facility] Social Workers [phone] for additional information on assistance programs or assistance connecting with community support programs   [place] DSS  336-641-3447: Information regarding food stamps, Medicaid, and utility assistance [place] 336-333-6589   [place] [employer]'s shared-ride transportation service for eligible riders who have a disability that prevents them from riding the fixed route bus.   Medicare Rights Center  800-333-4114 Helps people with Medicare understand their rights and benefits, navigate the Medicare system, and secure the quality healthcare they deserve [employer] 800-227-2345 Assists patients locate various types of support and financial assistance Cancer Care: 1-800-813-HOPE (4673) Provides financial assistance, online support groups, medication/co-pay assistance.   Transportation Assistance for appointments at [facility]: Transportation Coordinator [phone]  Again, thank you for choosing [facility] for your care.       

## 2019-07-16 NOTE — Progress Notes (Signed)
HEMATOLOGY/ONCOLOGY CONSULTATION NOTE  Date of Service: 07/16/2019  Patient Care Team: Sandi Mariscal, MD as PCP - General (Internal Medicine) Minus Breeding, MD as PCP - Cardiology (Cardiology) Leandrew Koyanagi, MD as Attending Physician (Orthopedic Surgery) [facility]  CHIEF COMPLAINTS/PURPOSE OF CONSULTATION:  Lymphadenopathy/Enlarged Spleen/Splenic Lesions  HISTORY OF PRESENTING ILLNESS:   [name] is a wonderful 54 y.o. female who has been referred to [place] by Dr. Harrie Jeans for evaluation and management of lymphadenopathy/enlarged spleen/splenic lesions. The pt reports that she is doing well overall.  The pt reports that she was experiencing a COVID19 infection last August. She is currently following with Dr. Percival Spanish for her mitral regurgitation and Dr. Harrie Jeans for her CKD. Pt is receiving dialysis 3-4 times per week and gets IV Iron with her treatments. Pt has been seen by Urology within the last 6 months. She is not currently begin followed for her sleep apnea or using a CPAP machine. Pt was following with a Rheumatologist who told the pt that she has severe RA. Pt was placed on Enbrel for 1 month, but had to discontinue after her left thumb became infected during her COVID19 infection. 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 Her RA is not currently being treated or managed.   Pt believes that her Diabetes is currently well-controlled. She has numbness and tingling in her lower extremities. Pt is receiving ocular Avastin injections to prevent bleeding behind the eyes. It is thought that she has abnormal blood vessels in the area due to Diabetes. Pt is smoking 1/2 ppd and is trying to quit. She had previously quit smoking for 7 months while using Chantix. Pt has previously required a blood transfusion and has had no notable anemia since.   Of note prior to the patient's visit today, pt has had CT Abd/Pel ([id number]) completed on  10/13/2018 with results revealing "1. Three indeterminate splenic lesions, 2 measuring 5 cm and 1 measuring 6.5 cm. Differential includes multiple benign etiologies but lymphoma or metastasis are not excluded. 2. Indeterminate 2.5 cm RIGHT renal mass, but suspicious for solid mass/renal cell carcinoma. 3. Mild bilateral ground-glass opacities which may represent infection in this patient with known COVID-19. Stable low pelvic midline ventral hernia containing bowel loops. No evidence of bowel obstruction. 5. Cardiomegaly and Aortic Atherosclerosis (ICD10-I70.0)."   Pt has had CT Angio Chest ([id number]) completed on 11/14/2018 with results revealing "1.  No demonstrable pulmonary embolus. 2. Dilatation of the main pulmonary outflow tract, a finding indicative of pulmonary arterial hypertension. 3. Ascending thoracic aortic prominence measuring 4.4 x 4.3 cm. No dissection evident. There are foci of aortic atherosclerosis as well as foci of great vessel and coronary artery calcification. There is left ventricular hypertrophy. Recommend annual imaging followup by CTA or MRA. This recommendation follows 2010 ACCF/AHA/AATS/ACR/ASA/SCA/SCAI/SIR/STS/SVM Guidelines for the Diagnosis and Management of Patients with Thoracic Aortic Disease. Circulation. 2010; 121: F840-R754. Aortic aneurysm NOS (ICD10-I71.9) 4. Adenopathy at several sites of uncertain etiology. Neoplastic etiology cannot be excluded. 5. Multiple areas of ground-glass type opacity, concerning for atypical organism pneumonia. Areas of atelectatic change also noted. Mild consolidation in the inferior lingula. 3 mm nodular opacity in the posterior segment right upper lobe noted. No follow-up needed if patient is low-risk. Non-contrast chest CT can be considered in 12 months if patient is high-risk. This recommendation follows the consensus statement: Guidelines for Management of Incidental Pulmonary Nodules Detected on CT Images: From the Fleischner Society  2017; Radiology  2017; 229:798-921. 6. Spleen appears prominent. Note that spleen is incompletely visualized. Splenic prominence as well as adenopathy potentially could be indicative of underlying lymphoma."  Most recent lab results (06/05/2019) of CBC w/diff and BMP is as follows: all values are WNL except for RBC at 2.92, Hgb at 9.7, HCT at 29.7, MCV at 101.7, Chloride at 95, Glucose at 241, BUN at 35, Creatinine at 6.02, GFR Est Af Am at 8.  On review of systems, pt reports watery eyes and denies fevers, chills, night sweats, unexpected weight loss, loss of appetite, abdominal pain and any other symptoms.   On PMHx the pt reports RA, CAD, COVID-19, HTN, Mitrial regurgitation, PVD, Sleep Apnea, CKD, DM II, Thoracic ascending aortic aneurysm, Left Thumb Amputation, AV Fistula Placement. On Social Hx the pt reports she smokes 1/2 ppd and is working on quitting.   MEDICAL HISTORY:  Past Medical History:  Diagnosis Date  . Anemia   . Anxiety   . Arthritis    "knees" "hands", "RA"  . CAD (coronary artery disease)    Nonobstructive on CT 2019  . COVID-19 10/2018  . Dyspnea    "when I have too much fluid."  . ESRD (end stage renal disease) ([place])    Dialysis TTHSat- 3rd   . Headache(784.0)   . Heart murmur   . High cholesterol   . History of blood transfusion   . Hypertension   . Mitral regurgitation    moderate to severe MR 10/2018 echo  . 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 Pericardial effusion   . Pneumonia    ; 11/16/2018- "touch of pneumonia" - seen in ED- 11/14/2018- on antibiotic  . PVD (peripheral vascular disease) ([place])    Right leg stent in [place].  (No records)  . Restless legs   . Sleep apnea    does not use Cpap  . Thoracic ascending aortic aneurysm ([facility])    4.4 cm 11/14/18 CTA  . Type II diabetes mellitus ([place])     SURGICAL HISTORY: Past Surgical History:  Procedure Laterality Date  . AMPUTATION Left 12/27/2018   Procedure: LEFT  THUMB REVISION AMPUTATION DIGIT;  Surgeon: Charlotte Crumb, MD;  Location: [facility];  Service: Orthopedics;  Laterality: Left;  . AV FISTULA PLACEMENT Left   . AV FISTULA PLACEMENT Right 03/12/2019   Procedure: INSERTION OF ARTERIOVENOUS (AV) GORE-TEX GRAFT ARM;  Surgeon: Elam Dutch, MD;  Location: [facility];  Service: Vascular;  Laterality: Right;  . [place]; 1997   x 2  . COLONOSCOPY    . EYE SURGERY Bilateral    cataract surgery  . INSERTION OF DIALYSIS CATHETER Right 09/26/2018   Procedure: INSERTION OF DIALYSIS CATHETER Right Internal Jugular.;  Surgeon: Elam Dutch, MD;  Location: [facility];  Service: Vascular;  Laterality: Right;  . IR FLUORO GUIDE CV LINE RIGHT  05/04/2019  . IR US GUIDE VASC ACCESS RIGHT  05/04/2019  . LIGATION OF ARTERIOVENOUS  FISTULA Left 09/26/2018   Procedure: LIGATION OF ARTERIOVENOUS  FISTULA LEFT ARM;  Surgeon: Elam Dutch, MD;  Location: [facility];  Service: Vascular;  Laterality: Left;  . PERICARDIAL WINDOW  02/2004   for pericardial effusion  . TUBAL LIGATION  05/1995  . UPPER EXTREMITY VENOGRAPHY N/A 06/22/2019   Procedure: UPPER EXTREMITY VENOGRAPHY - Right Central;  Surgeon: Elam Dutch, MD;  Location: [facility];  Service: Cardiovascular;  Laterality: N/A;  SOCIAL HISTORY: Social History   Socioeconomic History  . Marital status: Married    Spouse name: Not on file  . Number of children: Not on file  . Years of education: Not on file  . Highest education level: Not on file  Occupational History  . Not on file  Tobacco Use  . Smoking status: Current Every Day Smoker    Packs/day: 0.25    Years: 33.00    Pack years: 8.25    Types: Cigarettes  . Smokeless tobacco: Never Used  . Tobacco comment: 3-4 cigs daily  Vaping Use  . Vaping Use: Never used  Substance and Sexual Activity  . Alcohol use: No  . Drug use: No  . Sexual activity: Not Currently    Birth control/protection: Post-menopausal  Other Topics  Concern  . Not on file  Social History Narrative   Lives with son.        Social Determinants of Health   Financial Resource Strain:   . Difficulty of Paying Living Expenses:   Food Insecurity:   . Worried About Charity fundraiser in the Last Year:   . Arboriculturist in the Last Year:   Transportation Needs:   . Film/video editor (Medical):   Marland Kitchen Lack of Transportation (Non-Medical):   Physical Activity:   . Days of Exercise per Week:   . Minutes of Exercise per Session:   Stress:   . Feeling of Stress :   Social Connections:   . Frequency of Communication with Friends and Family:   . Frequency of Social Gatherings with Friends and Family:   . Attends Religious Services:   . Active Member of Clubs or Organizations:   . Attends Archivist Meetings:   Marland Kitchen Marital Status:   Intimate Partner Violence:   . Fear of Current or Ex-Partner:   . Emotionally Abused:   Marland Kitchen Physically Abused:   . Sexually Abused:     FAMILY HISTORY: Family History  Problem Relation Age of Onset  . Cancer Brother   . Heart disease Father        Died age 50  . Diabetes Father   . Hyperlipidemia Father   . Hypertension Father   . Stroke Father   . Diabetes Mother   . Hypertension Mother   . Stroke Mother   . Dementia Mother   . Diabetes Sister   . Diabetes Brother   . Hypertension Sister   . Hypertension Brother     ALLERGIES:  is allergic to bupropion.  MEDICATIONS:  Current Outpatient Medications  Medication Sig Dispense Refill  . AURYXIA 1 GM 210 MG(Fe) tablet Take 630-1,050 mg by mouth See admin instructions. Take 5 tablets (1050 mg) by mouth with each meal & take 3 tablets (630 mg) by mouth with each snack    . b complex-vitamin c-folic acid (NEPHRO-VITE) 0.8 MG TABS tablet Take 1 tablet by mouth daily.    . blood glucose meter kit and supplies KIT Dispense based on patient and insurance preference. Use up to four times daily as directed. (FOR ICD-9 250.00, 250.01). 1  each 0  . hydrOXYzine (ATARAX/VISTARIL) 10 MG tablet Take 10 mg by mouth daily.   1  . Insulin Lispro Prot & Lispro (HUMALOG MIX 75/25 KWIKPEN) (75-25) 100 UNIT/ML Kwikpen Inject 30 Units into the skin 2 (two) times daily with a meal.     . metoprolol succinate (TOPROL-XL) 100 MG 24 hr tablet Take 100 mg by mouth  daily. Take with or immediately following a meal.     . Oxycodone HCl 10 MG TABS Take 10 mg by mouth 3 (three) times daily as needed (pain).     . pramipexole (MIRAPEX) 0.5 MG tablet Take 0.5 mg by mouth daily.   1  . rosuvastatin (CRESTOR) 40 MG tablet Take 1 tablet (40 mg total) by mouth daily. 90 tablet 1  . acetaminophen (TYLENOL) 500 MG tablet Take 1 tablet (500 mg total) by mouth every 8 (eight) hours as needed. (Patient not taking: Reported on 07/13/2019) 15 tablet 0  . Lidocaine 4 % PTCH Apply 1 patch topically 2 (two) times daily. (Patient not taking: Reported on 07/13/2019) 12 patch 0   No current facility-administered medications for this visit.   Facility-Administered Medications Ordered in Other Visits  Medication Dose Route Frequency Provider Last Rate Last Admin  . 0.9 %  sodium chloride infusion  100 mL Intravenous PRN Lynnda Child, PA-C   New Bag at 03/12/19 1121  . 0.9 %  sodium chloride infusion  100 mL Intravenous PRN Lynnda Child, PA-C      . alteplase (CATHFLO ACTIVASE) injection 2 mg  2 mg Intracatheter Once PRN Lynnda Child, PA-C      . heparin injection 1,000 Units  1,000 Units Dialysis PRN Lynnda Child, PA-C      . heparin injection 10,900 Units  100 Units/kg Dialysis PRN Lynnda Child, PA-C      . heparin injection 10,900 Units  100 Units/kg Dialysis PRN Lynnda Child, PA-C      . lidocaine (PF) (XYLOCAINE) 1 % injection 5 mL  5 mL Intradermal PRN Lynnda Child, PA-C      . lidocaine-prilocaine (EMLA) cream 1 application  1 application Topical PRN Ejigiri, Thomos Lemons, PA-C      .  pentafluoroprop-tetrafluoroeth (GEBAUERS) aerosol 1 application  1 application Topical PRN Lynnda Child, PA-C        REVIEW OF SYSTEMS:    10 Point review of Systems was done is negative except as noted above.  PHYSICAL EXAMINATION: ECOG PERFORMANCE STATUS: 2 - Symptomatic, <50% confined to bed  . Vitals:   07/16/19 1109  BP: (!) 180/78  Pulse: 67  Resp: 18  Temp: (!) 97.5 F (36.4 C)  SpO2: 100%   Filed Weights   .There is no height or weight on file to calculate BMI.  GENERAL:alert, in no acute distress and comfortable SKIN: no acute rashes, no significant lesions EYES: conjunctiva are pink and non-injected, sclera anicteric OROPHARYNX: MMM, no exudates, no oropharyngeal erythema or ulceration NECK: supple, no JVD LYMPH:  no palpable lymphadenopathy in the cervical, axillary or inguinal regions LUNGS: clear to auscultation b/l with normal respiratory effort HEART: regular rate & rhythm ABDOMEN:  normoactive bowel sounds , non tender, not distended. Extremity: trace pedal edema PSYCH: alert & oriented x 3 with fluent speech NEURO: no focal motor/sensory deficits  LABORATORY DATA:  I have reviewed the data as listed  . CBC Latest Ref Rng & Units 07/16/2019 06/22/2019 06/05/2019  WBC 4.0 - 10.5 K/uL 6.4 - 10.5  Hemoglobin 12.0 - 15.0 g/dL 10.2(L) 10.5(L) 9.7(L)  Hematocrit 36 - 46 % 29.8(L) 31.0(L) 29.7(L)  Platelets 150 - 400 K/uL 181 - 204    . CMP Latest Ref Rng & Units 07/16/2019 06/22/2019 06/05/2019  Glucose 70 - 99 mg/dL 356(H) 164(H) 241(H)  BUN 6 - 20 mg/dL 55(H) 41(H) 35(H)  Creatinine 0.44 - 1.00 mg/dL  7.07(HH) 5.80(H) 6.02(H)  Sodium 135 - 145 mmol/L 136 134(L) 136  Potassium 3.5 - 5.1 mmol/L 4.2 5.5(H) 4.5  Chloride 98 - 111 mmol/L 93(L) 99 95(L)  CO2 22 - 32 mmol/L 25 - 26  Calcium 8.9 - 10.3 mg/dL 9.7 - 9.0  Total Protein 6.5 - 8.1 g/dL 8.0 - -  Total Bilirubin 0.3 - 1.2 mg/dL 0.4 - -  Alkaline Phos 38 - 126 U/L 110 - -  AST 15 - 41 U/L 15 -  -  ALT 0 - 44 U/L 20 - -     RADIOGRAPHIC STUDIES: I have personally reviewed the radiological images as listed and agreed with the findings in the report. PERIPHERAL VASCULAR CATHETERIZATION  Result Date: 06/22/2019 Procedure: Right central venogram Preoperative diagnosis: End-stage renal disease Postoperative diagnosis: Same Anesthetic: None Operative findings: Patent deep brachial versus basilic vein and patent central veins right side Stent mid right upper arm and what appears to be either the basilic or deep brachial system but it is in a different vein then the patent vein Operative details: After pain informed consent, patient taken the [place] lab.  Patient placed in supine position angio table.  An IV had been started in the preop holding area.  This was accessed and contrast injected through this to take multiple images of the right upper extremity and right chest.  The right innominate subclavian axillary veins are all patent.  There is one patent vein in the right upper extremity which crosses the antecubital area and then dumps eventually into the deep system.  This is either a deep brachial or basilic vein.  It appears to be of reasonable diameter for creation of a fistula. At this point the procedure was concluded.  The patient tired procedure well and there were no complications.  Patient was taken to the holding area in stable condition. Operative management: Patient be scheduled in the near future for an attempt at an new fistula utilizing this 1 remaining vein in her right upper extremity.  This will need to be done with general anesthesia. Ruta Hinds, MD [facility] Office: [phone]   ASSESSMENT & PLAN:   54 yo with   1) Axillary and mediastinal LNadenopathy 2) Splenic lesions R/o Lymphoma 3) Rt kidney mass ? RCC PLAN: -Discussed patient's most recent labs from 06/05/2019, all values are WNL except for RBC at 2.92, Hgb at 9.7, HCT at 29.7,  MCV at 101.7, Chloride at 95, Glucose at 241, BUN at 35, Creatinine at 6.02, GFR Est Af Am at 8. -Discussed 10/13/2018 CT Abd/Pel ([id number]) which revealed "1. Three indeterminate splenic lesions, 2 measuring 5 cm and 1 measuring 6.5 cm. Differential includes multiple benign etiologies but lymphoma or metastasis are not excluded. 2. Indeterminate 2.5 cm RIGHT renal mass, but suspicious for solid mass/renal cell carcinoma. 3. Mild bilateral ground-glass opacities which may represent infection in this patient with known COVID-19. Stable low pelvic midline ventral hernia containing bowel loops. No evidence of bowel obstruction. 5. Cardiomegaly and Aortic Atherosclerosis (ICD10-I70.0)." -Discussed 11/14/2018 CT Angio Chest ([id number]) which revealed "3. Ascending thoracic aortic prominence measuring 4.4 x 4.3 cm. No dissection evident. There are foci of aortic atherosclerosis as well as foci of great vessel and coronary artery calcification. There is left ventricular hypertrophy. 4. Adenopathy at several sites of uncertain etiology. Neoplastic etiology cannot be excluded. 5. Multiple areas of ground-glass type opacity, concerning for atypical organism pneumonia. Areas of atelectatic change also noted. Mild consolidation in the  inferior lingula. 3 mm nodular opacity in the posterior segment right upper lobe noted. 6. Spleen appears prominent. Note that spleen is incompletely visualized. Splenic prominence as well as adenopathy potentially could be indicative of underlying lymphoma." -Advised pt that a splenectomy would be complicated due to her other medical conditions  -Advised pt that uncontrolled RA can cause enlarged spleen, lung changes, and lymphadenopathy  -Advised pt that enlarged spleen, lung changes, and lymphadenopathy on CT scan could have been caused by COVID19 infection -Recommend complete smoking cessation - discuss options with PCP -Recommend pt f/u with her PCP, Dr. Nancy Fetter, for sleep apnea  evaluation & care coordination  -Recommend pt f/u with Rheumatologist for RA management -Will get labs today  -Will get PET/CT in 1 week  -Will see back in 2 weeks via phone     FOLLOW UP: Labs today PET/CT in 1 week Phone visit with Dr Irene Limbo in 2 weeks   All of the patients questions were answered with apparent satisfaction. The patient knows to call the clinic with any problems, questions or concerns.  I spent 35 mins counseling the patient face to face. The total time spent in the appointment was 45  minutes and more than 50% was on counseling and direct patient cares.    Sullivan Lone MD [place] [facility] Hematology/Oncology Physician [facility]  (Office):       [phone] (Work cell):  [phone] (Fax):           [phone]  07/16/2019 4:53 PM   I, Yevette Edwards, am acting as a scribe for Dr. Sullivan Lone.   .I have reviewed the above documentation for accuracy and completeness, and I agree with the above. Brunetta Genera MD

## 2019-07-17 LAB — MULTIPLE MYELOMA PANEL, SERUM
Albumin SerPl Elph-Mcnc: 3.5 g/dL (ref 2.9–4.4)
Albumin/Glob SerPl: 0.9 (ref 0.7–1.7)
Alpha 1: 0.2 g/dL (ref 0.0–0.4)
Alpha2 Glob SerPl Elph-Mcnc: 1.1 g/dL — ABNORMAL HIGH (ref 0.4–1.0)
B-Globulin SerPl Elph-Mcnc: 1.2 g/dL (ref 0.7–1.3)
Gamma Glob SerPl Elph-Mcnc: 1.4 g/dL (ref 0.4–1.8)
Globulin, Total: 4 g/dL — ABNORMAL HIGH (ref 2.2–3.9)
IgA: 447 mg/dL — ABNORMAL HIGH (ref 87–352)
IgG (Immunoglobin G), Serum: 1199 mg/dL (ref 586–1602)
IgM (Immunoglobulin M), Srm: 135 mg/dL (ref 26–217)
Total Protein ELP: 7.5 g/dL (ref 6.0–8.5)

## 2019-07-17 LAB — RHEUMATOID FACTOR: Rheumatoid fact SerPl-aCnc: 316.7 IU/mL — ABNORMAL HIGH (ref 0.0–13.9)

## 2019-07-18 LAB — FLOW CYTOMETRY

## 2019-07-20 ENCOUNTER — Telehealth: Payer: Self-pay

## 2019-07-20 ENCOUNTER — Encounter (HOSPITAL_COMMUNITY): Payer: Self-pay | Admitting: Vascular Surgery

## 2019-07-20 ENCOUNTER — Other Ambulatory Visit: Payer: Self-pay

## 2019-07-20 ENCOUNTER — Other Ambulatory Visit (HOSPITAL_COMMUNITY)
Admission: RE | Admit: 2019-07-20 | Discharge: 2019-07-20 | Disposition: A | Discharge status: Home / Self Care | Payer: Medicare Other | Source: Ambulatory Visit | Attending: Vascular Surgery | Admitting: Vascular Surgery

## 2019-07-20 DIAGNOSIS — Z01812 Encounter for preprocedural laboratory examination: Secondary | ICD-10-CM | POA: Insufficient documentation

## 2019-07-20 DIAGNOSIS — Z20822 Contact with and (suspected) exposure to covid-19: Secondary | ICD-10-CM | POA: Diagnosis not present

## 2019-07-20 NOTE — Telephone Encounter (Signed)
Pt called to verify appt time for surgery on Monday. Attempted to reach pt to inform to arrive at [facility] hospital on 07/23/19 at 1200pm, but no answer or vm available.

## 2019-07-20 NOTE — Telephone Encounter (Signed)
Attempted to reach patient again to provide surgery time information. Message received, call cannot be completed at this time.

## 2019-07-21 LAB — SARS CORONAVIRUS 2 (TAT 6-24 HRS): SARS Coronavirus 2: NEGATIVE

## 2019-07-23 NOTE — Anesthesia Preprocedure Evaluation (Addendum)
Anesthesia Evaluation  Patient identified by MRN, date of birth, ID band Patient awake    Reviewed: Allergy & Precautions, NPO status , Patient's Chart, lab work & pertinent test results  History of Anesthesia Complications Negative for: history of anesthetic complications  Airway Mallampati: III  TM Distance: >3 FB Neck ROM: Full    Dental  (+) Teeth Intact, Dental Advisory Given   Pulmonary shortness of breath, sleep apnea , pneumonia, Current Smoker and Patient abstained from smoking., former smoker,    breath sounds clear to auscultation       Cardiovascular hypertension, Pt. on home beta blockers + CAD and + Peripheral Vascular Disease  + Valvular Problems/Murmurs MR  Rhythm:Regular Rate:Normal     Neuro/Psych  Headaches, PSYCHIATRIC DISORDERS Anxiety    GI/Hepatic   Endo/Other  diabetes, Insulin Dependent  Renal/GU ESRF and DialysisRenal disease     Musculoskeletal  (+) Arthritis ,   Abdominal (+) + obese,   Peds  Hematology   Anesthesia Other Findings    Reproductive/Obstetrics                           Anesthesia Physical  Anesthesia Plan  ASA: III  Anesthesia Plan: MAC   Post-op Pain Management:  Regional for Post-op pain   Induction: Intravenous  PONV Risk Score and Plan: 2 and Treatment may vary due to age or medical condition, Propofol infusion and Ondansetron  Airway Management Planned: Nasal Cannula and Natural Airway  Additional Equipment: None  Intra-op Plan:   Post-operative Plan:   Informed Consent: I have reviewed the patients History and Physical, chart, labs and discussed the procedure including the risks, benefits and alternatives for the proposed anesthesia with the patient or authorized representative who has indicated his/her understanding and acceptance.     Dental advisory given  Plan Discussed with: CRNA  Anesthesia Plan Comments:  (Echo:  1. Left ventricular ejection fraction, by visual estimation, is 55 to  60%. The left ventricle has normal function. Normal left ventricular size.  There is moderately increased left ventricular hypertrophy.  2. Global right ventricle has normal systolic function.The right  ventricular size is mildly enlarged. No increase in right ventricular wall  thickness.  3. Left atrial size was moderately dilated.  4. Right atrial size was mildly dilated.  5. Mild aortic valve annular calcification.  6. The mitral valve is normal in structure. Moderate to severe mitral  valve regurgitation. No evidence of mitral stenosis.  7. The tricuspid valve is normal in structure. Tricuspid valve  regurgitation was not visualized by color flow Doppler.  8. The aortic valve is normal in structure. Aortic valve regurgitation  was not visualized by color flow Doppler. Mild to moderate aortic valve  sclerosis/calcification without any evidence of aortic stenosis.  9. The pulmonic valve was normal in structure. Pulmonic valve  regurgitation is mild by color flow Doppler.  10. Moderately elevated pulmonary artery systolic pressure.  11. The inferior vena cava is dilated in size with <50% respiratory  variability, suggesting right atrial pressure of 15 mmHg. )      Anesthesia Quick Evaluation

## 2019-07-30 ENCOUNTER — Inpatient Hospital Stay: Payer: Medicare Other | Admitting: Hematology

## 2019-07-30 ENCOUNTER — Other Ambulatory Visit: Payer: Self-pay

## 2019-07-30 ENCOUNTER — Telehealth: Payer: Self-pay | Admitting: Hematology

## 2019-07-30 NOTE — Progress Notes (Signed)
Patient scheduled for her PET scan on Wednesday 08/08/19 at 0700 to arrive at 0630. Scheduling message sent for a phone visit with Dr. Irene Limbo a week after on 08/15/19.

## 2019-07-30 NOTE — Telephone Encounter (Signed)
Scheduled per 6/28 sch message. Pt is aware of phone visit on 7/15.

## 2019-08-05 NOTE — Progress Notes (Signed)
This encounter was created in error - please disregard.

## 2019-08-08 ENCOUNTER — Ambulatory Visit (HOSPITAL_COMMUNITY): Admission: RE | Admit: 2019-08-08 | Payer: Medicare Other | Source: Ambulatory Visit

## 2019-08-09 ENCOUNTER — Encounter (HOSPITAL_COMMUNITY): Payer: Self-pay | Admitting: Vascular Surgery

## 2019-08-09 ENCOUNTER — Other Ambulatory Visit: Payer: Self-pay

## 2019-08-10 ENCOUNTER — Other Ambulatory Visit (HOSPITAL_COMMUNITY)
Admission: RE | Admit: 2019-08-10 | Discharge: 2019-08-10 | Disposition: A | Discharge status: Home / Self Care | Payer: Medicare Other | Source: Ambulatory Visit | Attending: Vascular Surgery | Admitting: Vascular Surgery

## 2019-08-10 DIAGNOSIS — Z01812 Encounter for preprocedural laboratory examination: Secondary | ICD-10-CM | POA: Insufficient documentation

## 2019-08-10 DIAGNOSIS — Z20822 Contact with and (suspected) exposure to covid-19: Secondary | ICD-10-CM | POA: Diagnosis not present

## 2019-08-10 LAB — SARS CORONAVIRUS 2 (TAT 6-24 HRS): SARS Coronavirus 2: NEGATIVE

## 2019-08-13 ENCOUNTER — Encounter (HOSPITAL_COMMUNITY)
Admission: RE | Disposition: A | Discharge status: Home / Self Care | Payer: Self-pay | Source: Home / Self Care | Attending: Vascular Surgery

## 2019-08-13 ENCOUNTER — Ambulatory Visit (HOSPITAL_COMMUNITY)
Admission: RE | Admit: 2019-08-13 | Discharge: 2019-08-13 | Disposition: A | Discharge status: Home / Self Care | Payer: Medicare Other | Attending: Vascular Surgery | Admitting: Vascular Surgery

## 2019-08-13 ENCOUNTER — Encounter (HOSPITAL_COMMUNITY): Payer: Self-pay | Admitting: Vascular Surgery

## 2019-08-13 ENCOUNTER — Ambulatory Visit (HOSPITAL_COMMUNITY): Payer: Medicare Other | Admitting: Anesthesiology

## 2019-08-13 ENCOUNTER — Other Ambulatory Visit: Payer: Self-pay

## 2019-08-13 DIAGNOSIS — N186 End stage renal disease: Secondary | ICD-10-CM | POA: Insufficient documentation

## 2019-08-13 DIAGNOSIS — F419 Anxiety disorder, unspecified: Secondary | ICD-10-CM | POA: Insufficient documentation

## 2019-08-13 DIAGNOSIS — E1151 Type 2 diabetes mellitus with diabetic peripheral angiopathy without gangrene: Secondary | ICD-10-CM | POA: Diagnosis not present

## 2019-08-13 DIAGNOSIS — G4730 Sleep apnea, unspecified: Secondary | ICD-10-CM | POA: Diagnosis not present

## 2019-08-13 DIAGNOSIS — Z8616 Personal history of COVID-19: Secondary | ICD-10-CM | POA: Insufficient documentation

## 2019-08-13 DIAGNOSIS — I2510 Atherosclerotic heart disease of native coronary artery without angina pectoris: Secondary | ICD-10-CM | POA: Diagnosis not present

## 2019-08-13 DIAGNOSIS — Y841 Kidney dialysis as the cause of abnormal reaction of the patient, or of later complication, without mention of misadventure at the time of the procedure: Secondary | ICD-10-CM | POA: Insufficient documentation

## 2019-08-13 DIAGNOSIS — Z992 Dependence on renal dialysis: Secondary | ICD-10-CM | POA: Insufficient documentation

## 2019-08-13 DIAGNOSIS — E7800 Pure hypercholesterolemia, unspecified: Secondary | ICD-10-CM | POA: Insufficient documentation

## 2019-08-13 DIAGNOSIS — E1122 Type 2 diabetes mellitus with diabetic chronic kidney disease: Secondary | ICD-10-CM | POA: Insufficient documentation

## 2019-08-13 DIAGNOSIS — Z6836 Body mass index (BMI) 36.0-36.9, adult: Secondary | ICD-10-CM | POA: Diagnosis not present

## 2019-08-13 DIAGNOSIS — M069 Rheumatoid arthritis, unspecified: Secondary | ICD-10-CM | POA: Insufficient documentation

## 2019-08-13 DIAGNOSIS — T82868A Thrombosis of vascular prosthetic devices, implants and grafts, initial encounter: Secondary | ICD-10-CM | POA: Insufficient documentation

## 2019-08-13 DIAGNOSIS — G2581 Restless legs syndrome: Secondary | ICD-10-CM | POA: Diagnosis not present

## 2019-08-13 DIAGNOSIS — I120 Hypertensive chronic kidney disease with stage 5 chronic kidney disease or end stage renal disease: Secondary | ICD-10-CM | POA: Insufficient documentation

## 2019-08-13 DIAGNOSIS — N185 Chronic kidney disease, stage 5: Secondary | ICD-10-CM

## 2019-08-13 DIAGNOSIS — E669 Obesity, unspecified: Secondary | ICD-10-CM | POA: Insufficient documentation

## 2019-08-13 HISTORY — PX: AV FISTULA PLACEMENT: SHX1204

## 2019-08-13 HISTORY — DX: Heart failure, unspecified: I50.9

## 2019-08-13 LAB — GLUCOSE, CAPILLARY
Glucose-Capillary: 246 mg/dL — ABNORMAL HIGH (ref 70–99)
Glucose-Capillary: 265 mg/dL — ABNORMAL HIGH (ref 70–99)
Glucose-Capillary: 193 mg/dL — ABNORMAL HIGH (ref 70–99)

## 2019-08-13 LAB — POCT I-STAT, CHEM 8
BUN: 50 mg/dL — ABNORMAL HIGH (ref 6–20)
Calcium, Ion: 1.27 mmol/L (ref 1.15–1.40)
Chloride: 94 mmol/L — ABNORMAL LOW (ref 98–111)
Creatinine, Ser: 7.7 mg/dL — ABNORMAL HIGH (ref 0.44–1.00)
Glucose, Bld: 265 mg/dL — ABNORMAL HIGH (ref 70–99)
HCT: 33 % — ABNORMAL LOW (ref 36.0–46.0)
Hemoglobin: 11.2 g/dL — ABNORMAL LOW (ref 12.0–15.0)
Potassium: 4.3 mmol/L (ref 3.5–5.1)
Sodium: 135 mmol/L (ref 135–145)
TCO2: 28 mmol/L (ref 22–32)

## 2019-08-13 SURGERY — ARTERIOVENOUS (AV) FISTULA CREATION
Anesthesia: General | Site: Arm Upper | Laterality: Right

## 2019-08-13 MED ORDER — PROPOFOL 10 MG/ML IV BOLUS
INTRAVENOUS | Status: DC | PRN
  Administered 2019-08-13: 150 mg via INTRAVENOUS

## 2019-08-13 MED ORDER — PROTAMINE SULFATE 10 MG/ML IV SOLN
INTRAVENOUS | Status: DC | PRN
Start: 2019-08-13 — End: 2019-08-13
  Administered 2019-08-13: 20 mg via INTRAVENOUS
  Administered 2019-08-13: 10 mg via INTRAVENOUS
  Administered 2019-08-13 (×2): 20 mg via INTRAVENOUS

## 2019-08-13 MED ORDER — FENTANYL CITRATE (PF) 100 MCG/2ML IJ SOLN
INTRAMUSCULAR | Status: AC
  Filled 2019-08-13: qty 2

## 2019-08-13 MED ORDER — CEFAZOLIN SODIUM-DEXTROSE 2-4 GM/100ML-% IV SOLN
2.0000 g | INTRAVENOUS | Status: AC
  Administered 2019-08-13: 2 g via INTRAVENOUS
  Filled 2019-08-13: qty 100

## 2019-08-13 MED ORDER — ONDANSETRON HCL 4 MG/2ML IJ SOLN
INTRAMUSCULAR | Status: DC | PRN
  Administered 2019-08-13: 4 mg via INTRAVENOUS

## 2019-08-13 MED ORDER — MIDAZOLAM HCL 2 MG/2ML IJ SOLN
INTRAMUSCULAR | Status: AC
  Filled 2019-08-13: qty 2

## 2019-08-13 MED ORDER — PROMETHAZINE HCL 25 MG/ML IJ SOLN: 6.2500 mg | INTRAMUSCULAR | Status: DC | PRN

## 2019-08-13 MED ORDER — DEXAMETHASONE SODIUM PHOSPHATE 10 MG/ML IJ SOLN
INTRAMUSCULAR | Status: AC
  Filled 2019-08-13: qty 1

## 2019-08-13 MED ORDER — HEPARIN SODIUM (PORCINE) 1000 UNIT/ML IJ SOLN
2000.0000 [IU] | Freq: Once | INTRAMUSCULAR | Status: AC
  Administered 2019-08-13: 1000 [IU] via INTRAVENOUS

## 2019-08-13 MED ORDER — 0.9 % SODIUM CHLORIDE (POUR BTL) OPTIME
TOPICAL | Status: DC | PRN
  Administered 2019-08-13: 1000 mL

## 2019-08-13 MED ORDER — ONDANSETRON HCL 4 MG/2ML IJ SOLN
INTRAMUSCULAR | Status: AC
  Filled 2019-08-13: qty 2

## 2019-08-13 MED ORDER — PROPOFOL 10 MG/ML IV BOLUS
INTRAVENOUS | Status: AC
  Filled 2019-08-13: qty 20

## 2019-08-13 MED ORDER — CHLORHEXIDINE GLUCONATE 0.12 % MT SOLN
15.0000 mL | Freq: Once | OROMUCOSAL | Status: AC
  Administered 2019-08-13: 15 mL via OROMUCOSAL
  Filled 2019-08-13: qty 15

## 2019-08-13 MED ORDER — ORAL CARE MOUTH RINSE: 15.0000 mL | Freq: Once | OROMUCOSAL | Status: AC

## 2019-08-13 MED ORDER — PHENYLEPHRINE HCL-NACL 10-0.9 MG/250ML-% IV SOLN
INTRAVENOUS | Status: DC | PRN
Start: 2019-08-13 — End: 2019-08-13
  Administered 2019-08-13: 50 ug/min via INTRAVENOUS

## 2019-08-13 MED ORDER — LIDOCAINE 2% (20 MG/ML) 5 ML SYRINGE
INTRAMUSCULAR | Status: AC
  Filled 2019-08-13: qty 5

## 2019-08-13 MED ORDER — SODIUM CHLORIDE 0.9 % IV SOLN: INTRAVENOUS | Status: DC | PRN

## 2019-08-13 MED ORDER — LIDOCAINE HCL (PF) 1 % IJ SOLN
INTRAMUSCULAR | Status: AC
  Filled 2019-08-13: qty 30

## 2019-08-13 MED ORDER — FENTANYL CITRATE (PF) 100 MCG/2ML IJ SOLN
25.0000 ug | INTRAMUSCULAR | Status: DC | PRN
  Administered 2019-08-13: 50 ug via INTRAVENOUS

## 2019-08-13 MED ORDER — SODIUM CHLORIDE 0.9 % IV SOLN
INTRAVENOUS | Status: AC
  Filled 2019-08-13: qty 1.2

## 2019-08-13 MED ORDER — INSULIN ASPART 100 UNIT/ML ~~LOC~~ SOLN
10.0000 [IU] | Freq: Once | SUBCUTANEOUS | Status: AC
  Administered 2019-08-13: 10 [IU] via INTRAVENOUS

## 2019-08-13 MED ORDER — FENTANYL CITRATE (PF) 250 MCG/5ML IJ SOLN
INTRAMUSCULAR | Status: AC
  Filled 2019-08-13: qty 5

## 2019-08-13 MED ORDER — FENTANYL CITRATE (PF) 250 MCG/5ML IJ SOLN
INTRAMUSCULAR | Status: DC | PRN
  Administered 2019-08-13 (×6): 25 ug via INTRAVENOUS

## 2019-08-13 MED ORDER — PHENYLEPHRINE 40 MCG/ML (10ML) SYRINGE FOR IV PUSH (FOR BLOOD PRESSURE SUPPORT)
PREFILLED_SYRINGE | INTRAVENOUS | Status: AC
  Filled 2019-08-13: qty 10

## 2019-08-13 MED ORDER — PROTAMINE SULFATE 10 MG/ML IV SOLN
INTRAVENOUS | Status: AC
  Filled 2019-08-13: qty 10

## 2019-08-13 MED ORDER — HEPARIN SODIUM (PORCINE) 1000 UNIT/ML IJ SOLN
INTRAMUSCULAR | Status: DC | PRN
  Administered 2019-08-13: 7000 [IU] via INTRAVENOUS

## 2019-08-13 MED ORDER — PHENYLEPHRINE 40 MCG/ML (10ML) SYRINGE FOR IV PUSH (FOR BLOOD PRESSURE SUPPORT)
PREFILLED_SYRINGE | INTRAVENOUS | Status: DC | PRN
  Administered 2019-08-13: 40 ug via INTRAVENOUS
  Administered 2019-08-13: 80 ug via INTRAVENOUS
  Administered 2019-08-13: 40 ug via INTRAVENOUS
  Administered 2019-08-13: 120 ug via INTRAVENOUS
  Administered 2019-08-13: 40 ug via INTRAVENOUS

## 2019-08-13 MED ORDER — CHLORHEXIDINE GLUCONATE 4 % EX LIQD: 60.0000 mL | Freq: Once | CUTANEOUS | Status: DC

## 2019-08-13 MED ORDER — SODIUM CHLORIDE 0.9 % IV SOLN: INTRAVENOUS | Status: DC

## 2019-08-13 MED ORDER — HEPARIN SODIUM (PORCINE) 1000 UNIT/ML IJ SOLN
INTRAMUSCULAR | Status: AC
  Filled 2019-08-13: qty 1

## 2019-08-13 MED ORDER — SUCCINYLCHOLINE CHLORIDE 200 MG/10ML IV SOSY
PREFILLED_SYRINGE | INTRAVENOUS | Status: AC
  Filled 2019-08-13: qty 10

## 2019-08-13 SURGICAL SUPPLY — 35 items
ADH SKN CLS APL DERMABOND .7 (GAUZE/BANDAGES/DRESSINGS) ×2
ARMBAND PINK RESTRICT EXTREMIT (MISCELLANEOUS) ×4 IMPLANT
BLADE CLIPPER SENSICLIP SURGIC (BLADE) ×1 IMPLANT
CANISTER SUCT 3000ML PPV (MISCELLANEOUS) ×2 IMPLANT
CANNULA VESSEL 3MM 2 BLNT TIP (CANNULA) ×2 IMPLANT
CLIP VESOCCLUDE MED 6/CT (CLIP) ×2 IMPLANT
CLIP VESOCCLUDE SM WIDE 6/CT (CLIP) ×3 IMPLANT
COVER PROBE W GEL 5X96 (DRAPES) ×1 IMPLANT
COVER WAND RF STERILE (DRAPES) ×1 IMPLANT
DECANTER SPIKE VIAL GLASS SM (MISCELLANEOUS) ×1 IMPLANT
DERMABOND ADVANCED (GAUZE/BANDAGES/DRESSINGS) ×2
DERMABOND ADVANCED .7 DNX12 (GAUZE/BANDAGES/DRESSINGS) ×1 IMPLANT
DRAIN PENROSE 1/4X12 LTX STRL (WOUND CARE) ×2 IMPLANT
ELECT REM PT RETURN 9FT ADLT (ELECTROSURGICAL) ×2
ELECTRODE REM PT RTRN 9FT ADLT (ELECTROSURGICAL) ×1 IMPLANT
GAUZE 4X4 16PLY RFD (DISPOSABLE) ×1 IMPLANT
GLOVE BIO SURGEON STRL SZ7.5 (GLOVE) ×2 IMPLANT
GOWN STRL REUS W/ TWL LRG LVL3 (GOWN DISPOSABLE) ×3 IMPLANT
GOWN STRL REUS W/TWL LRG LVL3 (GOWN DISPOSABLE) ×6
KIT BASIN OR (CUSTOM PROCEDURE TRAY) ×2 IMPLANT
KIT TURNOVER KIT B (KITS) ×2 IMPLANT
LOOP VESSEL MINI RED (MISCELLANEOUS) IMPLANT
NS IRRIG 1000ML POUR BTL (IV SOLUTION) ×2 IMPLANT
PACK CV ACCESS (CUSTOM PROCEDURE TRAY) ×2 IMPLANT
PAD ARMBOARD 7.5X6 YLW CONV (MISCELLANEOUS) ×4 IMPLANT
SPONGE SURGIFOAM ABS GEL 100 (HEMOSTASIS) IMPLANT
SUT PROLENE 6 0 CC (SUTURE) ×2 IMPLANT
SUT PROLENE 7 0 BV 1 (SUTURE) ×1 IMPLANT
SUT SILK 2 0 SH (SUTURE) ×1 IMPLANT
SUT VIC AB 3-0 SH 27 (SUTURE) ×8
SUT VIC AB 3-0 SH 27X BRD (SUTURE) ×1 IMPLANT
SUT VICRYL 4-0 PS2 18IN ABS (SUTURE) ×6 IMPLANT
TOWEL GREEN STERILE (TOWEL DISPOSABLE) ×2 IMPLANT
UNDERPAD 30X36 HEAVY ABSORB (UNDERPADS AND DIAPERS) ×2 IMPLANT
WATER STERILE IRR 1000ML POUR (IV SOLUTION) ×2 IMPLANT

## 2019-08-13 NOTE — Transfer of Care (Signed)
Immediate Anesthesia Transfer of Care Note  Patient: [name]  Procedure(s) Performed: RIGHT UPPER ARM ARTERIOVENOUS (AV) FISTULA CREATION WITH BASILIC VEIN (Right Arm Upper)  Patient Location: PACU  Anesthesia Type:General  Level of Consciousness: awake and alert   Airway & Oxygen Therapy: Patient Spontanous Breathing and Patient connected to face mask oxygen  Post-op Assessment: Report given to RN, Post -op Vital signs reviewed and stable and Patient moving all extremities  Post vital signs: Reviewed and stable  Last Vitals:  Vitals Value Taken Time  BP    Temp    Pulse    Resp 27 08/13/19 1109  SpO2    Vitals shown include unvalidated device data.  Last Pain:  Vitals:   08/13/19 0702  PainSc: 3       Patients Stated Pain Goal: 2 (48/27/07 8675)  Complications: No complications documented.

## 2019-08-13 NOTE — Anesthesia Postprocedure Evaluation (Signed)
Anesthesia Post Note  Patient: COLLETTA SPILLERS  Procedure(s) Performed: RIGHT UPPER ARM ARTERIOVENOUS (AV) FISTULA CREATION WITH BASILIC VEIN (Right Arm Upper)     Patient location during evaluation: PACU Anesthesia Type: General Level of consciousness: awake and alert Pain management: pain level controlled Vital Signs Assessment: post-procedure vital signs reviewed and stable Respiratory status: spontaneous breathing, nonlabored ventilation, respiratory function stable and patient connected to nasal cannula oxygen Cardiovascular status: blood pressure returned to baseline and stable Postop Assessment: no apparent nausea or vomiting Anesthetic complications: no   No complications documented.  Last Vitals:  Vitals:   08/13/19 1200 08/13/19 1236  BP: (!) 90/43 (!) 120/49  Pulse: 63 69  Resp: (!) 21 20  Temp: (!) 36.3 C 36.5 C  SpO2: 96% 97%    Last Pain:  Vitals:   08/13/19 1200  TempSrc:   PainSc: 3                  Effie Berkshire

## 2019-08-13 NOTE — Op Note (Addendum)
Procedure: Right upper extremity basilic vein transposition fistula  Preoperative diagnosis: End-stage renal disease  Postoperative diagnosis: Same  Anesthesia: General  Assistant: Leontine Locket, PA-C  Operative findings: 3 mm right basilic vein  Operative details: After obtaining form consent, the patient taken the operating.  The patient was placed in supine position operating table.  After induction of general anesthesia patient's right in her upper extremity was prepped and draped in usual sterile fashion.  Ultrasound was used to identify the patient's right basilic vein and the course into the upper arm.  This was marked.  Next a longitudinal incision was made over the basilic vein in the forearm just below the antecubital crease.  Incision was carried down through the subcutaneous tissues down the level of the vein.  The vein was of reasonable quality about 3 mm diameter.  It was dissected free circumferentially.  I then proceeded to dissect out the vein completely from just below the antecubital crease all the way up to the axilla through several skip incisions.  Side branches were ligated divided tween silk ties or clips.  Care was taken to try to avoid any sensory or motor nerves.  After the vein had been fully mobilized an additional longitudinal incision was made through pre-existing scar near the antecubital crease to expose the artery.  The incision was deepened down the level right brachial artery.  There were dense adhesions around this from prior operation.  All of these were taken down with cautery.  The artery was dissected free circumferentially.  It was soft on palpation had a good pulse within it.  Vesseloops were placed proximal distal to the site of arteriotomy.  At this point the patient distal basilic vein was ligated with 2-0 silk tie and the vein transected.  It was gently distended with heparinized saline.  It excepted up to a 4 mm dilator.  It was then tunneled  subcutaneously and arcing configuration over the biceps muscle down to the antecubital incision for the artery.  Patient was then given 7000 units of intravenous heparin.  The artery was controlled proximally distally with Vesseloops.  Longitudinal opening was made in the artery and the vein sewn end of vein to side of artery using a running 7-0 Prolene suture.  Despite completion of anastomosis it was for blood backbled and thoroughly flushed.  Anastomosis was secured clamps released and clamped was initially pulsatile but then developed a thrill after a few minutes.  There was good flow within the fistula.  Hemostasis was obtained with the assistance of 50 mg of protamine as well as direct pressure.  All incisions were then closed in multiple layers of running 3-0 Vicryl subcu cutaneous stitch followed by 4-0 Vicryl subcuticular stitch followed by Dermabond.  The patient tolerated the procedure well and there were no complications.  The instrument sponge and needle counts correct the end of the case.  The patient was taken the recovery in stable condition.  Assistant was utilized for completion of closure of incisions adequate exposure and retraction.  Ruta Hinds, MD [facility] [place] Office: [phone]

## 2019-08-13 NOTE — Discharge Instructions (Signed)
   [facility]  Discharge Instructions  AV Fistula or Graft Surgery for Dialysis Access  Please refer to the following instructions for your post-procedure care. Your surgeon or physician assistant will discuss any changes with you.  Activity  You may drive the day following your surgery, if you are comfortable and no longer taking prescription pain medication. Resume full activity as the soreness in your incision resolves.  Bathing/Showering  You may shower after you go home. Keep your incision dry for 48 hours. Do not soak in a bathtub, hot tub, or swim until the incision heals completely. You may not shower if you have a hemodialysis catheter.  Incision Care  Clean your incision with mild soap and water after 48 hours. Pat the area dry with a clean towel. You do not need a bandage unless otherwise instructed. Do not apply any ointments or creams to your incision. You may have skin glue on your incision. Do not peel it off. It will come off on its own in about one week. Your arm may swell a bit after surgery. To reduce swelling use pillows to elevate your arm so it is above your heart. Your doctor will tell you if you need to lightly wrap your arm with an ACE bandage.  Diet  Resume your normal diet. There are not special food restrictions following this procedure. In order to heal from your surgery, it is CRITICAL to get adequate nutrition. Your body requires vitamins, minerals, and protein. Vegetables are the best source of vitamins and minerals. Vegetables also provide the perfect balance of protein. Processed food has little nutritional value, so try to avoid this.  Medications  Resume taking all of your medications. If your incision is causing pain, you may take over-the counter pain relievers such as acetaminophen (Tylenol). If you were prescribed a stronger pain medication, please be aware these medications can cause nausea and constipation. Prevent  nausea by taking the medication with a snack or meal. Avoid constipation by drinking plenty of fluids and eating foods with high amount of fiber, such as fruits, vegetables, and grains.  Do not take Tylenol if you are taking prescription pain medications.  Follow up Your surgeon may want to see you in the office following your access surgery. If so, this will be arranged at the time of your surgery.  Please call us immediately for any of the following conditions:  . Increased pain, redness, drainage (pus) from your incision site . Fever of 101 degrees or higher . Severe or worsening pain at your incision site . Hand pain or numbness. .  Reduce your risk of vascular disease:  . Stop smoking. If you would like help, call [facility] at 1-800-QUIT-NOW 504-828-2906) or [place] at 615-321-1483  . Manage your cholesterol . Maintain a desired weight . Control your diabetes . Keep your blood pressure down  Dialysis  It will take several weeks to several months for your new dialysis access to be ready for use. Your surgeon will determine when it is okay to use it. Your nephrologist will continue to direct your dialysis. You can continue to use your Permcath until your new access is ready for use.   08/13/2019 [name] [id number] 27-Dec-1965  Surgeon(s): Elam Dutch, MD  Procedure(s): Single stage right basilic vein transposition.  x Do not stick fistula for 12 weeks    If you have any questions, please call the office at [phone].

## 2019-08-13 NOTE — H&P (Signed)
Patient is a 54 year old female who presents today for further evaluation for hemodialysis access.  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  Most recently she had a right upper arm AV graft placed February 2021.  This apparently was declotted once at [facility].  It was then subsequently declotted again a few days later and then abandoned.  She currently is dialyzing by right sided catheter.  Her dialysis today is Tuesday Thursday Saturday.  Patient is obese and basically pickwickian and frequently falls asleep during mid sentences.      Past Medical History:  Diagnosis Date  . Anemia   . Anxiety   . Arthritis    "knees" "hands", "RA"  . CAD (coronary artery disease)    Nonobstructive on CT 2019  . COVID-19 10/2018  . Dyspnea    "when I have too much fluid."  . ESRD (end stage renal disease) ([place])    Dialysis TTHSat- 3rd   . Headache(784.0)   . Heart murmur   . High cholesterol   . History of blood transfusion   . Hypertension   . Mitral regurgitation    moderate to severe MR 10/2018 echo  . 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 Pericardial effusion   . Pneumonia    ; 11/16/2018- "touch of pneumonia" - seen in ED- 11/14/2018- on antibiotic  . PVD (peripheral vascular disease) ([place])    Right leg stent in [place].  (No records)  . Restless legs   . Sleep apnea    does not use Cpap  . Thoracic ascending aortic aneurysm (HCC)    4.4 cm 11/14/18 CTA  . Type II diabetes mellitus ([place])          Past Surgical History:  Procedure Laterality Date  . AMPUTATION Left 12/27/2018   Procedure: LEFT THUMB REVISION AMPUTATION DIGIT;  Surgeon: Charlotte Crumb, MD;  Location: [place];  Service: Orthopedics;  Laterality: Left;  . AV FISTULA PLACEMENT Left   . AV FISTULA PLACEMENT Right 03/12/2019   Procedure: INSERTION OF  ARTERIOVENOUS (AV) GORE-TEX GRAFT ARM;  Surgeon: Elam Dutch, MD;  Location: [place];  Service: Vascular;  Laterality: Right;  . [place]; 1997   x 2  . COLONOSCOPY    . EYE SURGERY Bilateral    cataract surgery  . INSERTION OF DIALYSIS CATHETER Right 09/26/2018   Procedure: INSERTION OF DIALYSIS CATHETER Right Internal Jugular.;  Surgeon: Elam Dutch, MD;  Location: [facility];  Service: Vascular;  Laterality: Right;  . IR FLUORO GUIDE CV LINE RIGHT  05/04/2019  . IR US GUIDE VASC ACCESS RIGHT  05/04/2019  . LIGATION OF ARTERIOVENOUS  FISTULA Left 09/26/2018   Procedure: LIGATION OF ARTERIOVENOUS  FISTULA LEFT ARM;  Surgeon: Elam Dutch, MD;  Location: [place];  Service: Vascular;  Laterality: Left;  . PERICARDIAL WINDOW  02/2004   for pericardial effusion  . TUBAL LIGATION  05/1995   Review of systems: She has no fever or chills.  She has no shortness of breath or chest pain.   Physical exam:   Vitals:   08/13/19 0625 08/13/19 0633 08/13/19 0702  BP: (!) 185/69    Pulse: 62    Resp: 20    Temp:  98.6 F (37 C)   SpO2: 100%    Weight:   108.9 kg  Height: 5\' 8"  (1.727 m)      Extremities: Well-healed amputation of fingers on left hand, thrombosed right upper arm AV graft  1+ to absent femoral pulses bilaterally absent pedal pulses bilaterally  Assessment: Patient has now failed multiple access procedures in the upper extremities.  She currently is dialyzing via right sided catheter.  A thigh graft is not going to be a very good option for her considering her obesity and large pannus and minimally palpable pulses.  Plan: /Right arm AV F today Ruta Hinds, MD [facility] [place] Office: [phone]

## 2019-08-13 NOTE — Anesthesia Procedure Notes (Signed)
Procedure Name: LMA Insertion Date/Time: 08/13/2019 8:02 AM Performed by: Myna Bright, CRNA Pre-anesthesia Checklist: Patient identified, Emergency Drugs available, Suction available and Patient being monitored Patient Re-evaluated:Patient Re-evaluated prior to induction Oxygen Delivery Method: Circle system utilized Preoxygenation: Pre-oxygenation with 100% oxygen Induction Type: IV induction LMA: LMA with gastric port inserted LMA Size: 4.0 Number of attempts: 1 Placement Confirmation: positive ETCO2 and breath sounds checked- equal and bilateral Tube secured with: Tape Dental Injury: Teeth and Oropharynx as per pre-operative assessment

## 2019-08-14 ENCOUNTER — Encounter (HOSPITAL_COMMUNITY): Payer: Self-pay | Admitting: Vascular Surgery

## 2019-08-15 ENCOUNTER — Ambulatory Visit (INDEPENDENT_AMBULATORY_CARE_PROVIDER_SITE_OTHER): Payer: Self-pay | Admitting: Physician Assistant

## 2019-08-15 ENCOUNTER — Telehealth: Payer: Self-pay | Admitting: Hematology

## 2019-08-15 ENCOUNTER — Other Ambulatory Visit: Payer: Self-pay

## 2019-08-15 ENCOUNTER — Encounter (HOSPITAL_COMMUNITY)
Admission: RE | Admit: 2019-08-15 | Discharge: 2019-08-15 | Disposition: A | Discharge status: Home / Self Care | Payer: Medicare Other | Source: Ambulatory Visit | Attending: Hematology | Admitting: Hematology

## 2019-08-15 VITALS — BP 104/57 | HR 68 | Temp 97.5°F | Resp 20 | Ht 68.0 in | Wt 240.3 lb

## 2019-08-15 DIAGNOSIS — I2510 Atherosclerotic heart disease of native coronary artery without angina pectoris: Secondary | ICD-10-CM | POA: Diagnosis not present

## 2019-08-15 DIAGNOSIS — R918 Other nonspecific abnormal finding of lung field: Secondary | ICD-10-CM | POA: Diagnosis not present

## 2019-08-15 DIAGNOSIS — R599 Enlarged lymph nodes, unspecified: Secondary | ICD-10-CM

## 2019-08-15 DIAGNOSIS — N186 End stage renal disease: Secondary | ICD-10-CM

## 2019-08-15 DIAGNOSIS — R1900 Intra-abdominal and pelvic swelling, mass and lump, unspecified site: Secondary | ICD-10-CM | POA: Diagnosis not present

## 2019-08-15 DIAGNOSIS — Z992 Dependence on renal dialysis: Secondary | ICD-10-CM

## 2019-08-15 DIAGNOSIS — I700 Atherosclerosis of aorta: Secondary | ICD-10-CM | POA: Diagnosis not present

## 2019-08-15 DIAGNOSIS — N2889 Other specified disorders of kidney and ureter: Secondary | ICD-10-CM | POA: Clinically undetermined

## 2019-08-15 LAB — GLUCOSE, CAPILLARY: Glucose-Capillary: 101 mg/dL — ABNORMAL HIGH (ref 70–99)

## 2019-08-15 MED ORDER — FLUDEOXYGLUCOSE F - 18 (FDG) INJECTION
11.6800 | Freq: Once | INTRAVENOUS | Status: AC | PRN
  Administered 2019-08-15: 11.68 via INTRAVENOUS

## 2019-08-15 NOTE — Progress Notes (Signed)
HEMATOLOGY/ONCOLOGY CONSULTATION NOTE  Date of Service: 08/17/2019  Patient Care Team: Sandi Mariscal, MD as PCP - General (Internal Medicine) Minus Breeding, MD as PCP - Cardiology (Cardiology) Leandrew Koyanagi, MD as Attending Physician (Orthopedic Surgery) [facility]  CHIEF COMPLAINTS/PURPOSE OF CONSULTATION:  Lymphadenopathy/Enlarged Spleen/Splenic Lesions  HISTORY OF PRESENTING ILLNESS:   I connected with [name] on 08/17/19 at  9:00 AM EDT and verified that I am speaking with the correct person using two identifiers.   I discussed the limitations, risks, security and privacy concerns of performing an evaluation and management service by telemedicine and the availability of in-person appointments. I also discussed with the patient that there may be a patient responsible charge related to this service. The patient expressed understanding and agreed to proceed.   Other persons participating in the visit and their role in the encounter:     -Dawayne Cirri, Medical Scribe   Patient's location: Home  Provider's location: [place] [facility] [place] is a wonderful 54 y.o. female who has been referred to [place] by Dr. Harrie Jeans for evaluation and management of lymphadenopathy/enlarged spleen/splenic lesions. The patient's last visit with [place] was on 07/16/19. The [name] reports that she is doing well overall.  The [name] reports she is good. She still feels fine. [name] has been off of her RA treatment for a while now. She is seeing her urologist and has f/u's with them in October.   Of note since the patient's last visit, [name] has had Surgical Pathology [phone]) completed on 07/16/19 with results revealing "No monoclonal B-cell population. No aberrant T-cell population. No significant increase in large granular lymphocytes. " [name] had PET Skull Base to Thigh ([id number]) completed on 08/15/19 with results revealing "1. Right kidney upper pole mass  posteriorly is hypermetabolic and probably a renal cell carcinoma based on imaging. 2. The splenic masses have activity similar to the rest of the spleen. These could be hamartomas, hemangiomas, or similar benign lesions. Lymphoma seems less likely given the lack of abnormal activity, but surveillance may be warranted. 3. No compelling findings of hypermetabolic adenopathy to further bolster a case for lymphoma. 4. Hazy patchy ground-glass opacities in the lungs are nonspecific but could be from extrinsic allergic alveolitis, hypersensitivity pneumonitis, or atypical pneumonia. There is cardiomegaly and low-grade edema is a differential diagnostic consideration. 5. Pelvic mass with fatty and calcific elements. This likely represents fibroids and a uterine lipoma, less likely dermoid. 6. Gas and fluid in the right upper extremity subcutaneous tissues near a stent, correlate with any recent surgical intervention. 7. Other imaging findings of potential clinical significance: Aortic Atherosclerosis (ICD10-I70.0). Coronary atherosclerosis. Moderate cardiomegaly. Infraumbilical hernia containing loops of bowel without strangulation."  Lab results today (07/16/19) of CBC w/diff and CMP is as follows: all values are WNL except for RBC at 3.10, Hemoglobin at 11.2, HCT at 33, Chloride at 94, Glucose at 265, BUN at 50, Creatinine at 7.70, GFR, Est Non Af Am at 6, GFR, Est AFR Am at 7, Anion Gap at 18 07/16/19 of Ferritin at 1553 07/16/19 of Sedimentation Rate at 106 07/16/19 of Rheumatoid Factor at 316.7 07/16/19 of LDH at 222 07/16/19 of MMP is as follows: all values are WNL except for IgA at 447, Alpha2 Glob SerPl Elph-Mcnc at 1., Globulin, total at 4.0  On review of systems, [name] reports edema and denies fevers, chills, night sweats and any other symptoms.    MEDICAL HISTORY:  Past  Medical History:  Diagnosis Date  . Anemia   . Anxiety   . Arthritis    "knees" "hands", "RA"  . CAD (coronary artery  disease)    Nonobstructive on CT 2019  . CHF (congestive heart failure) ([place])   . COVID-19 10/2018  . Dyspnea    "when I have too much fluid."  . ESRD (end stage renal disease) ([place])    Dialysis [facility]- [facility]  . Headache(784.0)   . Heart murmur   . High cholesterol   . History of blood transfusion   . Hypertension   . Mitral regurgitation    moderate to severe MR 10/2018 echo  . 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 Pericardial effusion   . Pneumonia    ; 11/16/2018- "touch of pneumonia" - seen in ED- 11/14/2018- on antibiotic. Has had it x 2  . PVD (peripheral vascular disease) ([place])    Right leg stent in [place].  (No records)  . Restless legs   . Sleep apnea    does not use Cpap  . Thoracic ascending aortic aneurysm ([facility])    4.4 cm 11/14/18 CTA  . Type II diabetes mellitus ([place])     SURGICAL HISTORY: Past Surgical History:  Procedure Laterality Date  . AMPUTATION Left 12/27/2018   Procedure: LEFT THUMB REVISION AMPUTATION DIGIT;  Surgeon: Charlotte Crumb, MD;  Location: [facility];  Service: Orthopedics;  Laterality: Left;  . AV FISTULA PLACEMENT Left   . AV FISTULA PLACEMENT Right 03/12/2019   Procedure: INSERTION OF ARTERIOVENOUS (AV) GORE-TEX GRAFT ARM;  Surgeon: Elam Dutch, MD;  [facility];  Service: Vascular;  Laterality: Right;  . AV FISTULA PLACEMENT Right 08/13/2019   Procedure: RIGHT UPPER ARM ARTERIOVENOUS (AV) FISTULA CREATION WITH BASILIC VEIN;  Surgeon: Elam Dutch, MD;  Location: [place];  Service: Vascular;  Laterality: Right;  . [place]; 1997   x 2  . COLONOSCOPY    . EYE SURGERY Bilateral    cataract surgery  . INSERTION OF DIALYSIS CATHETER Right 09/26/2018   Procedure: INSERTION OF DIALYSIS CATHETER Right Internal Jugular.;  Surgeon: Elam Dutch, MD;  Location: [place];  Service: Vascular;  Laterality: Right;  . IR FLUORO GUIDE CV LINE RIGHT  05/04/2019  . IR US GUIDE VASC ACCESS RIGHT  05/04/2019   . LIGATION OF ARTERIOVENOUS  FISTULA Left 09/26/2018   Procedure: LIGATION OF ARTERIOVENOUS  FISTULA LEFT ARM;  Surgeon: Elam Dutch, MD;  Location: [facility];  Service: Vascular;  Laterality: Left;  . PERICARDIAL WINDOW  02/2004   for pericardial effusion  . TUBAL LIGATION  05/1995  . UPPER EXTREMITY VENOGRAPHY N/A 06/22/2019   Procedure: UPPER EXTREMITY VENOGRAPHY - Right Central;  Surgeon: Elam Dutch, MD;  Location: [place] [facility];  Service: Cardiovascular;  Laterality: N/A;    SOCIAL HISTORY: Social History   Socioeconomic History  . Marital status: Married    Spouse name: Not on file  . Number of children: Not on file  . Years of education: Not on file  . Highest education level: Not on file  Occupational History  . Not on file  Tobacco Use  . Smoking status: Former Smoker    Years: 33.00    Types: Cigarettes    Quit date: 07/17/2019    Years since quitting: 0.0  . Smokeless tobacco: Never Used  . Tobacco comment: using NIcotene gum, rare 1/2 cigarette  Vaping Use  . Vaping Use: Never used  Substance and Sexual Activity  . Alcohol use: No  . Drug use: No  . Sexual activity: Not Currently    Birth control/protection: Post-menopausal  Other Topics Concern  . Not on file  Social History Narrative   Lives with son.        Social Determinants of Health   Financial Resource Strain:   . Difficulty of Paying Living Expenses:   Food Insecurity:   . Worried About Charity fundraiser in the Last Year:   . Arboriculturist in the Last Year:   Transportation Needs:   . Film/video editor (Medical):   Marland Kitchen Lack of Transportation (Non-Medical):   Physical Activity:   . Days of Exercise per Week:   . Minutes of Exercise per Session:   Stress:   . Feeling of Stress :   Social Connections:   . Frequency of Communication with Friends and Family:   . Frequency of Social Gatherings with Friends and Family:   . Attends Religious Services:   . Active Member of  Clubs or Organizations:   . Attends Archivist Meetings:   Marland Kitchen Marital Status:   Intimate Partner Violence:   . Fear of Current or Ex-Partner:   . Emotionally Abused:   Marland Kitchen Physically Abused:   . Sexually Abused:     FAMILY HISTORY: Family History  Problem Relation Age of Onset  . Cancer Brother   . Heart disease Father        Died age 4  . Diabetes Father   . Hyperlipidemia Father   . Hypertension Father   . Stroke Father   . Diabetes Mother   . Hypertension Mother   . Stroke Mother   . Dementia Mother   . Diabetes Sister   . Diabetes Brother   . Hypertension Sister   . Hypertension Brother     ALLERGIES:  is allergic to bupropion.  MEDICATIONS:  Current Outpatient Medications  Medication Sig Dispense Refill  . AURYXIA 1 GM 210 MG(Fe) tablet Take 630-1,050 mg by mouth See admin instructions. Take 5 tablets (1050 mg) by mouth with each meal & take 3 tablets (630 mg) by mouth with each snack    . b complex-vitamin c-folic acid (NEPHRO-VITE) 0.8 MG TABS tablet Take 1 tablet by mouth daily.    . blood glucose meter kit and supplies KIT Dispense based on patient and insurance preference. Use up to four times daily as directed. (FOR ICD-9 250.00, 250.01). 1 each 0  . hydrOXYzine (ATARAX/VISTARIL) 10 MG tablet Take 10 mg by mouth daily.   1  . Insulin Lispro Prot & Lispro (HUMALOG MIX 75/25 KWIKPEN) (75-25) 100 UNIT/ML Kwikpen Inject 30 Units into the skin 2 (two) times daily with a meal.     . metoprolol succinate (TOPROL-XL) 100 MG 24 hr tablet Take 100 mg by mouth daily. Take with or immediately following a meal.     . neomycin-polymyxin b-dexamethasone (MAXITROL) 3.5-10000-0.1 OINT     . Oxycodone HCl 10 MG TABS Take 10 mg by mouth 3 (three) times daily as needed (pain).     . pramipexole (MIRAPEX) 0.5 MG tablet Take 0.5 mg by mouth daily.   1  . rosuvastatin (CRESTOR) 40 MG tablet Take 1 tablet (40 mg total) by mouth daily. 90 tablet 1  . traZODone (DESYREL) 50 MG  tablet Take 50 mg by mouth at bedtime.    Marland Kitchen VITAMIN D PO  Take by mouth.     No current facility-administered medications for this visit.   Facility-Administered Medications Ordered in Other Visits  Medication Dose Route Frequency Provider Last Rate Last Admin  . 0.9 %  sodium chloride infusion  100 mL Intravenous PRN Lynnda Child, PA-C   New Bag at 08/13/19 [phone]  . 0.9 %  sodium chloride infusion  100 mL Intravenous PRN Lynnda Child, PA-C      . alteplase (CATHFLO ACTIVASE) injection 2 mg  2 mg Intracatheter Once PRN Lynnda Child, PA-C      . heparin injection 1,000 Units  1,000 Units Dialysis PRN Lynnda Child, PA-C      . heparin injection 10,900 Units  100 Units/kg Dialysis PRN Lynnda Child, PA-C      . heparin injection 10,900 Units  100 Units/kg Dialysis PRN Lynnda Child, PA-C      . lidocaine (PF) (XYLOCAINE) 1 % injection 5 mL  5 mL Intradermal PRN Lynnda Child, PA-C      . lidocaine-prilocaine (EMLA) cream 1 application  1 application Topical PRN Ejigiri, Thomos Lemons, PA-C      . pentafluoroprop-tetrafluoroeth (GEBAUERS) aerosol 1 application  1 application Topical PRN Lynnda Child, PA-C        REVIEW OF SYSTEMS:   A 10+ POINT REVIEW OF SYSTEMS WAS OBTAINED including neurology, dermatology, psychiatry, cardiac, respiratory, lymph, extremities, GI, GU, Musculoskeletal, constitutional, breasts, reproductive, HEENT.  All pertinent positives are noted in the HPI.  All others are negative.   PHYSICAL EXAMINATION: ECOG PERFORMANCE STATUS: 2 - Symptomatic, <50% confined to bed  . There were no vitals filed for this visit. There were no vitals filed for this visit. .There is no height or weight on file to calculate BMI.  Tele-Health Visit   LABORATORY DATA:  I have reviewed the data as listed  . CBC Latest Ref Rng & Units 08/13/2019 07/16/2019 06/22/2019  WBC 4.0 - 10.5 K/uL - 6.4 -  Hemoglobin 12.0 - 15.0 g/dL  11.2(L) 10.2(L) 10.5(L)  Hematocrit 36 - 46 % 33.0(L) 29.8(L) 31.0(L)  Platelets 150 - 400 K/uL - 181 -    . CMP Latest Ref Rng & Units 08/13/2019 07/16/2019 06/22/2019  Glucose 70 - 99 mg/dL 265(H) 356(H) 164(H)  BUN 6 - 20 mg/dL 50(H) 55(H) 41(H)  Creatinine 0.44 - 1.00 mg/dL 7.70(H) 7.07(HH) 5.80(H)  Sodium 135 - 145 mmol/L 135 136 134(L)  Potassium 3.5 - 5.1 mmol/L 4.3 4.2 5.5(H)  Chloride 98 - 111 mmol/L 94(L) 93(L) 99  CO2 22 - 32 mmol/L - 25 -  Calcium 8.9 - 10.3 mg/dL - 9.7 -  Total Protein 6.5 - 8.1 g/dL - 8.0 -  Total Bilirubin 0.3 - 1.2 mg/dL - 0.4 -  Alkaline Phos 38 - 126 U/L - 110 -  AST 15 - 41 U/L - 15 -  ALT 0 - 44 U/L - 20 -     RADIOGRAPHIC STUDIES: I have personally reviewed the radiological images as listed and agreed with the findings in the report. NM PET Image Initial (PI) Skull Base To Thigh  Result Date: 08/15/2019 CLINICAL DATA:  Initial treatment strategy for lymphadenopathy and splenomegaly, query lymphoma. History of urologic cancer. EXAM: NUCLEAR MEDICINE PET SKULL BASE TO THIGH TECHNIQUE: 11.7 mCi F-18 FDG was injected intravenously. Full-ring PET imaging was performed from the skull base to thigh after the radiotracer. CT data was obtained and used for attenuation correction and anatomic localization. Fasting blood glucose:  101 mg/dl COMPARISON:  Multiple exams, including chest CT from 11/14/2018 and CT abdomen from 10/13/2018 FINDINGS: Mediastinal blood pool activity: SUV max 2.8 Liver activity: SUV max 3.8 NECK: Physiologic activity and left lower posterior neck musculature. Right level IV lymph node 0.9 cm in short axis on image 38/4, maximum SUV 1.0. Incidental CT findings: Upper normal sized axillary, left supraclavicular, and mediastinal lymph nodes noted without significant hypermetabolic activity. 1.2 cm right lower paratracheal node anterior to the carina has a maximum SUV of 2.7, below blood pool. Incidental CT findings: Right internal jugular  catheter tip: Right atrium. Coronary, aortic arch, and branch vessel atherosclerotic vascular disease. Moderate cardiomegaly. Hazy patchy ground-glass opacities are present in the lungs are nonspecific but possibly from extrinsic allergic alveolitis or atypical pneumonia. Low-grade edema is likewise a differential diagnostic consideration. ABDOMEN/PELVIS: The spleen measures 14.5 by 6.4 by 12.9 cm but no focal abnormal accentuated splenic activity is identified either in the spleen or in the previously identified splenic masses. Contour convexities associated with the splenic masses are still observed. A right kidney upper pole mass posteriorly measuring about 2.2 cm in diameter on image 118/4 appears to be mildly hypermetabolic, suspicious for renal cell carcinoma. A left external iliac node measuring 0.7 cm in diameter has a maximum SUV of 4.2, minimally accentuated. Additional small pelvic lymph nodes present but with activity similar to blood pool. Pelvic mass with fatty and calcific elements, the calcific elements resemble fibroids, the left fatty element is new from 07/19/2011 and unusual but could be a manifestation of dermoid or an unusual lipoma involving the uterus. No significant associated hypermetabolic activity. Incidental CT findings: Infraumbilical hernia containing loops of bowel without strangulation. Aortoiliac atherosclerotic vascular disease. 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 SKELETON: No significant abnormal hypermetabolic activity in this region. Incidental CT findings: none IMPRESSION: 1. Right kidney upper pole mass posteriorly is hypermetabolic and probably a renal cell carcinoma based on imaging. 2. The splenic masses have activity similar to the rest of the spleen. These could be hamartomas, hemangiomas, or similar benign lesions. Lymphoma seems less likely given the lack of abnormal activity, but surveillance may be warranted. 3. No compelling findings of  hypermetabolic adenopathy to further bolster a case for lymphoma. 4. Hazy patchy ground-glass opacities in the lungs are nonspecific but could be from extrinsic allergic alveolitis, hypersensitivity pneumonitis, or atypical pneumonia. There is cardiomegaly and low-grade edema is a differential diagnostic consideration. 5. Pelvic mass with fatty and calcific elements. This likely represents fibroids and a uterine lipoma, less likely dermoid. 6. Gas and fluid in the right upper extremity subcutaneous tissues near a stent, correlate with any recent surgical intervention. 7. Other imaging findings of potential clinical significance: Aortic Atherosclerosis (ICD10-I70.0). Coronary atherosclerosis. Moderate cardiomegaly. Infraumbilical hernia containing loops of bowel without strangulation. Electronically Signed   By: Van Clines M.D.   On: 08/15/2019 15:45    ASSESSMENT & PLAN:   54 yo with   1) Axillary and mediastinal LNadenopathy 2) Splenic lesions R/o Lymphoma 3) Rt kidney mass ? RCC PLAN: -Discussed [name] labwork today, 07/16/19; of CBC w/diff and CMP is as follows: all values are WNL except for RBC at 3.10, Hemoglobin at 11.2, HCT at 33, Chloride at 94, Glucose at 265, BUN at 50, Creatinine at 7.70, GFR, Est Non Af Am at 6, GFR, Est AFR Am at 7, Anion Gap at 18 -Discussed 07/16/19 of Ferritin at 1553 -Discussed 07/16/19 of Sedimentation Rate at 106 -Discussed 07/16/19 of Rheumatoid  Factor at 316.7 -Discussed 07/16/19 of LDH at 222 -Discussed 07/16/19 of MMP is as follows: all values are WNL except for IgA at 447, Alpha2 Glob SerPl Elph-Mcnc at 1., Globulin, Total at 4.0 -Discussed 07/16/19 flow cytometry with no evidence of clonal lymphoproliferative process -Discussed 08/15/19 of PET Skull Base to Thigh ([id number]) -lymph nodes probably reactive- borderline in size and no FDG avid., spleen enlargement- with splenic lesions not FDG avid. Presentation of this might be likely from uncontrolled  RA considering significant elevated in sed rate and RF  mild pneumonitis on PET -- ? ILD from RA. -Advised no obvious lymphoma and LN too small to biopsy. -Advised [name] that a splenectomy would be complicated due to her other medical conditions and is not recommended for diagnostic purposes based on PET/CT findings.-Advised on hamartomas, hemangiomas, or similar benign lesions in spleen  -Advised right kidney lesions is lighting up on scans -possibly renal cell carcinoma (patient was strongly advised to let urologist know [name] had PET scan to review to decide if they need to see [name] sooner)  -Advised [name] that uncontrolled RA can cause enlarged spleen, lung changes, and lymphadenopathy  -Recommends f/u with Urologist/Nephrologist for possible renal cell carcinoma- [facility]  -Recommend [name] f/u with Rheumatologist for RA management -Recommend [name] f/u with her PCP, Dr. Nancy Fetter, for sleep apnea evaluation, complete smoking cessation, care coordination  -Will see back in 6 months with labs in 6 months  FOLLOW UP: RTC with Dr Irene Limbo with labs in 6 months   The total time spent in the appt was 40 minutes and more than 50% was on counseling and direct patient cares.  All of the patient's questions were answered with apparent satisfaction. The patient knows to call the clinic with any problems, questions or concerns.  Sullivan Lone MD MS AAHIVMS [facility] Hematology/Oncology Physician [facility]  (Office):       [phone] (Work cell):  [phone] (Fax):           [phone]  08/17/2019 1:51 PM  I, Dawayne Cirri am acting as a Education administrator for Dr. Sullivan Lone.   .I have reviewed the above documentation for accuracy and completeness, and I agree with the above. Brunetta Genera MD

## 2019-08-15 NOTE — Progress Notes (Signed)
    Postoperative Access Visit   History of Present Illness   [name] is a 54 y.o. year old female who presents for postoperative follow-up for right upper extremity basilic vein transposition. This was performed as a single staged fistula with tunneling subcutaneously over biceps muscle. This was performed two days ago on 08/13/19 by Dr. Oneida Alar.  At dialysis yesterday there was concerns of audible bruit but no palpable thrill so at request of dialysis center she was asked to be seen today. The patient's wounds are intact. She had mild bloody oozing yesterday from the distal most medial incision near [facility].  The patient notes no steal symptoms. She has minimal surgical site pain. She does have some upper extremity swelling  She currently dialyzes via right IJ TDC TTS at the [facility] [address] location Physical Examination   Vitals:   08/15/19 1041  BP: (!) 104/57  Pulse: 68  Resp: 20  Temp: (!) 97.5 F (36.4 C)  TempSrc: Temporal  SpO2: 95%  Weight: 240 lb 4.8 oz (109 kg)  Height: 5\' 8"  (1.727 m)   Body mass index is 36.54 kg/m.  right arm Incisions are intact. The Dermabond skin adhesive that was placed over incisions was all time of surgery was all gone. I applied new Dermabond to the distal medial incision and AC incision and steri strips to the remaining skip incisions in the right upper arm, 2+ radial pulse, hand grip is 5/5, sensation in digits is intact, palpable thrill, bruit can  be auscultated. Right upper extremity with swelling    Medical Decision Making   [name] is a 54 y.o. year old female who presents s/p right upper extremity basilic vein transposition. This was performed as a single staged fistula with tunneling subcutaneously over biceps muscle. This was performed two days ago on 08/13/19 by Dr. Oneida Alar. She has palpable thrill and bruit. The thrill is somewhat dampened due to patients swelling but anticipate with elevation of the right upper arm  this will be easier to evaluate. Her incisions are intact. I reapplied Dermabond skin adhesive and steri strips to the incisions since this was removed for some reason. Advised her that starting tomorrow she could shower with mild soap and water and pat dry the right upper arm. She will continue to observe arm for any signs of infection  Patent is without signs or symptoms of steal syndrome  Recommend that she elevate her right upper extremity as much as possible to help with swelling  She has follow up incision check on 09/06/19. She will keep this appointment  She additionally has follow up with Dr. Oneida Alar on 8/26 with fistula duplex   Karoline Caldwell, PA-C [facility] Office: [phone]  Clinic MD: Oneida Alar

## 2019-08-16 ENCOUNTER — Telehealth: Payer: Self-pay | Admitting: *Deleted

## 2019-08-16 ENCOUNTER — Inpatient Hospital Stay: Payer: Medicare Other | Attending: Hematology | Admitting: Hematology

## 2019-08-16 DIAGNOSIS — N289 Disorder of kidney and ureter, unspecified: Secondary | ICD-10-CM

## 2019-08-16 DIAGNOSIS — R161 Splenomegaly, not elsewhere classified: Secondary | ICD-10-CM | POA: Diagnosis not present

## 2019-08-16 DIAGNOSIS — R591 Generalized enlarged lymph nodes: Secondary | ICD-10-CM

## 2019-08-16 NOTE — Telephone Encounter (Signed)
Patient called - states she was in dialysis this morning when Dr. Irene Limbo called. She states she tried to contact office on Wednesday to notify that she would be in dialysis this morning, but was not able to get through. She would like to know her test results and asked if Dr. Irene Limbo could call her later. Dr. Irene Limbo informed.  Dr. Irene Limbo informed this writer that multiple attempts were made to contact Ms. [name] during clinic hours today. Calls were not answered and no ability to leave message. He asked that schedule message be sent to schedule appointment for her. Schedule message sent

## 2019-08-17 ENCOUNTER — Telehealth: Payer: Self-pay

## 2019-08-17 NOTE — Telephone Encounter (Signed)
Pt. missed her appt. with Dr. Irene Limbo yesterday because she was at dialysis. She showed up today to see MD without an appt. Spoke with pt. and explained that MD is fully booked and can't see her without an appt. Informed her that a scheduling message has been sent and she should be getting an appt. to see MD soon. She verbalized her concern to know what her test results are and is very anxious about that. Informed pt. MD would be notified of her concerns and would possibly give her a call in between his visits today. Dr. Irene Limbo notified and he verbalized calling patient to discuss test results.

## 2019-08-23 ENCOUNTER — Telehealth: Payer: Self-pay | Admitting: Hematology

## 2019-08-23 NOTE — Telephone Encounter (Signed)
Scheduled appointments per 7/22 provider message. Patient is aware of appointment date and time.

## 2019-08-23 NOTE — Addendum Note (Signed)
Addended by: Sullivan Lone on: 08/23/2019 12:37 AM   Modules accepted: Orders

## 2019-08-28 ENCOUNTER — Ambulatory Visit: Payer: Medicare Other | Admitting: Podiatry

## 2019-09-04 ENCOUNTER — Other Ambulatory Visit: Payer: Self-pay

## 2019-09-04 ENCOUNTER — Ambulatory Visit (INDEPENDENT_AMBULATORY_CARE_PROVIDER_SITE_OTHER): Payer: Medicare Other | Admitting: Podiatry

## 2019-09-04 DIAGNOSIS — B351 Tinea unguium: Secondary | ICD-10-CM | POA: Diagnosis not present

## 2019-09-04 DIAGNOSIS — E1151 Type 2 diabetes mellitus with diabetic peripheral angiopathy without gangrene: Secondary | ICD-10-CM

## 2019-09-04 DIAGNOSIS — M79674 Pain in right toe(s): Secondary | ICD-10-CM | POA: Diagnosis not present

## 2019-09-04 DIAGNOSIS — M79675 Pain in left toe(s): Secondary | ICD-10-CM | POA: Diagnosis not present

## 2019-09-04 NOTE — Patient Instructions (Signed)
Diabetes Mellitus and Foot Care Foot care is an important part of your health, especially when you have diabetes. Diabetes may cause you to have problems because of poor blood flow (circulation) to your feet and legs, which can cause your skin to:  Become thinner and drier.  Break more easily.  Heal more slowly.  Peel and crack. You may also have nerve damage (neuropathy) in your legs and feet, causing decreased feeling in them. This means that you may not notice minor injuries to your feet that could lead to more serious problems. Noticing and addressing any potential problems early is the best way to prevent future foot problems. How to care for your feet Foot hygiene  Wash your feet daily with warm water and mild soap. Do not use hot water. Then, pat your feet and the areas between your toes until they are completely dry. Do not soak your feet as this can dry your skin.  Trim your toenails straight across. Do not dig under them or around the cuticle. File the edges of your nails with an emery board or nail file.  Apply a moisturizing lotion or petroleum jelly to the skin on your feet and to dry, brittle toenails. Use lotion that does not contain alcohol and is unscented. Do not apply lotion between your toes. Shoes and socks  Wear clean socks or stockings every day. Make sure they are not too tight. Do not wear knee-high stockings since they may decrease blood flow to your legs.  Wear shoes that fit properly and have enough cushioning. Always look in your shoes before you put them on to be sure there are no objects inside.  To break in new shoes, wear them for just a few hours a day. This prevents injuries on your feet. Wounds, scrapes, corns, and calluses  Check your feet daily for blisters, cuts, bruises, sores, and redness. If you cannot see the bottom of your feet, use a mirror or ask someone for help.  Do not cut corns or calluses or try to remove them with medicine.  If you  find a minor scrape, cut, or break in the skin on your feet, keep it and the skin around it clean and dry. You may clean these areas with mild soap and water. Do not clean the area with peroxide, alcohol, or iodine.  If you have a wound, scrape, corn, or callus on your foot, look at it several times a day to make sure it is healing and not infected. Check for: ? Redness, swelling, or pain. ? Fluid or blood. ? Warmth. ? Pus or a bad smell. General instructions  Do not cross your legs. This may decrease blood flow to your feet.  Do not use heating pads or hot water bottles on your feet. They may burn your skin. If you have lost feeling in your feet or legs, you may not know this is happening until it is too late.  Protect your feet from hot and cold by wearing shoes, such as at the beach or on hot pavement.  Schedule a complete foot exam at least once a year (annually) or more often if you have foot problems. If you have foot problems, report any cuts, sores, or bruises to your health care provider immediately. Contact a health care provider if:  You have a medical condition that increases your risk of infection and you have any cuts, sores, or bruises on your feet.  You have an injury that is not   healing.  You have redness on your legs or feet.  You feel burning or tingling in your legs or feet.  You have pain or cramps in your legs and feet.  Your legs or feet are numb.  Your feet always feel cold.  You have pain around a toenail. Get help right away if:  You have a wound, scrape, corn, or callus on your foot and: ? You have pain, swelling, or redness that gets worse. ? You have fluid or blood coming from the wound, scrape, corn, or callus. ? Your wound, scrape, corn, or callus feels warm to the touch. ? You have pus or a bad smell coming from the wound, scrape, corn, or callus. ? You have a fever. ? You have a red line going up your leg. Summary  Check your feet every day  for cuts, sores, red spots, swelling, and blisters.  Moisturize feet and legs daily.  Wear shoes that fit properly and have enough cushioning.  If you have foot problems, report any cuts, sores, or bruises to your health care provider immediately.  Schedule a complete foot exam at least once a year (annually) or more often if you have foot problems. This information is not intended to replace advice given to you by your health care provider. Make sure you discuss any questions you have with your health care provider. Document Revised: 10/11/2018 Document Reviewed: 02/20/2016 Elsevier Patient Education  2020 [employer] Inc[employer]  

## 2019-09-06 ENCOUNTER — Ambulatory Visit: Payer: Medicare Other

## 2019-09-07 ENCOUNTER — Ambulatory Visit (INDEPENDENT_AMBULATORY_CARE_PROVIDER_SITE_OTHER): Payer: Self-pay | Admitting: Physician Assistant

## 2019-09-07 ENCOUNTER — Ambulatory Visit: Payer: Medicare Other

## 2019-09-07 ENCOUNTER — Other Ambulatory Visit: Payer: Self-pay

## 2019-09-07 VITALS — BP 134/63 | HR 69 | Temp 98.0°F | Resp 20 | Ht 68.0 in | Wt 243.7 lb

## 2019-09-07 DIAGNOSIS — N186 End stage renal disease: Secondary | ICD-10-CM

## 2019-09-07 DIAGNOSIS — Z992 Dependence on renal dialysis: Secondary | ICD-10-CM

## 2019-09-07 NOTE — Progress Notes (Addendum)
POST OPERATIVE DIALYSIS ACCESS OFFICE NOTE    CC:  F/u for dialysis access surgery  HPI:  This is a 54 y.o. female who is s/p right upper extremity basilic vein transposition fistula on August 13, 2019 by Dr. Oneida Alar.  She is currently dialyzing via right IJ tunneled dialysis catheter without complications.  She denies right hand pain, fever or chills.    Dialysis days: Tuesdays, Thursdays and Saturdays Dialysis center:  [facility]  Allergies  Allergen Reactions  . Bupropion Itching    Current Outpatient Medications  Medication Sig Dispense Refill  . AURYXIA 1 GM 210 MG(Fe) tablet Take 630-1,050 mg by mouth See admin instructions. Take 5 tablets (1050 mg) by mouth with each meal & take 3 tablets (630 mg) by mouth with each snack    . b complex-vitamin c-folic acid (NEPHRO-VITE) 0.8 MG TABS tablet Take 1 tablet by mouth daily.    . blood glucose meter kit and supplies KIT Dispense based on patient and insurance preference. Use up to four times daily as directed. (FOR ICD-9 250.00, 250.01). 1 each 0  . hydrOXYzine (ATARAX/VISTARIL) 10 MG tablet Take 10 mg by mouth daily.   1  . Insulin Lispro Prot & Lispro (HUMALOG MIX 75/25 KWIKPEN) (75-25) 100 UNIT/ML Kwikpen Inject 30 Units into the skin 2 (two) times daily with a meal.     . Methoxy PEG-Epoetin Beta (MIRCERA IJ) Mircera    . metoprolol succinate (TOPROL-XL) 100 MG 24 hr tablet Take 100 mg by mouth daily. Take with or immediately following a meal.     . neomycin-polymyxin b-dexamethasone (MAXITROL) 3.5-10000-0.1 OINT     . Oxycodone HCl 10 MG TABS Take 10 mg by mouth 3 (three) times daily as needed (pain).     . pramipexole (MIRAPEX) 0.5 MG tablet Take 0.5 mg by mouth daily.   1  . rosuvastatin (CRESTOR) 40 MG tablet Take 1 tablet (40 mg total) by mouth daily. 90 tablet 1  . traZODone (DESYREL) 50 MG tablet Take 50 mg by mouth at bedtime.    Marland Kitchen VITAMIN D PO Take by mouth.     No current facility-administered medications  for this visit.   Facility-Administered Medications Ordered in Other Visits  Medication Dose Route Frequency Provider Last Rate Last Admin  . 0.9 %  sodium chloride infusion  100 mL Intravenous PRN Lynnda Child, PA-C   New Bag at 08/13/19 [phone]  . 0.9 %  sodium chloride infusion  100 mL Intravenous PRN Lynnda Child, PA-C      . alteplase (CATHFLO ACTIVASE) injection 2 mg  2 mg Intracatheter Once PRN Lynnda Child, PA-C      . heparin injection 1,000 Units  1,000 Units Dialysis PRN Lynnda Child, PA-C      . heparin injection 10,900 Units  100 Units/kg Dialysis PRN Lynnda Child, PA-C      . heparin injection 10,900 Units  100 Units/kg Dialysis PRN Lynnda Child, PA-C      . lidocaine (PF) (XYLOCAINE) 1 % injection 5 mL  5 mL Intradermal PRN Lynnda Child, PA-C      . lidocaine-prilocaine (EMLA) cream 1 application  1 application Topical PRN Ejigiri, Thomos Lemons, PA-C      . pentafluoroprop-tetrafluoroeth (GEBAUERS) aerosol 1 application  1 application Topical PRN Ejigiri, Thomos Lemons, PA-C         ROS:  See HPI  BP 134/63 (BP Location: Left Arm, Patient Position: Sitting, Cuff Size: Large)  Pulse 69   Temp 98 F (36.7 C) (Temporal)   Resp 20   Ht 5' 8"  (1.727 m)   Wt 243 lb 11.2 oz (110.5 kg)   LMP  (LMP Unknown) Comment: LMP Feb 2011  SpO2 95%   BMI 37.05 kg/m    Physical Exam:  General appearance: wd, wn in NAD Cardiac: RRR Respiratory: nonlabored Incision: Right upper extremity incisions are all well approximated and healing without signs of infection. Extremities: Right upper extremity: Good bruit and thrill in fistula.  2+ radial and ulnar palpable pulses.  5 out of 5 right hand grip strength   Assessment/Plan:   -pt does not have evidence of steal syndrome -Follow-up appointment previously arranged as well as duplex examination of AV fistula -Continue HD treatment via [place], PA-C 09/07/2019  9:08 AM [facility] [phone]  Clinic MD: Dr. Trula Slade on call

## 2019-09-10 ENCOUNTER — Other Ambulatory Visit: Payer: Self-pay

## 2019-09-10 DIAGNOSIS — Z992 Dependence on renal dialysis: Secondary | ICD-10-CM

## 2019-09-11 NOTE — Progress Notes (Signed)
Subjective: 54 year old female with past medical history significant for end-stage renal disease, uncontrolled diabetes presents the office today for concerns of her toenails becoming thickened discolored she states that they are "growing bad".  They do cause discomfort inside shoes but denies any redness or drainage or any swelling.  She denies any open sores at this time.  She has no other concerns today.  Objective: AAO x3, NAD DP/PT pulses decreased bilaterally, CRT less than 3 seconds Sensation decreased with Semmes Weinstein monofilament. Nails are hypertrophic, dystrophic, brittle, discolored, elongated 10. No surrounding redness or drainage. Tenderness nails 1-5 bilaterally. No open lesions or pre-ulcerative lesions are identified today. No pain with calf compression, swelling, warmth, erythema  Assessment: Symptomatic onychomycosis  Plan: -All treatment options discussed with the patient including all alternatives, risks, complications.  -Debrided nails x10 without any complications or bleeding.  Discussed we can try to remove the nail bed but the hold off on this given circulation.  Recommend daily foot inspection we discussed recommend debridement of the nails.  We will see her back for routine debridement or sooner if any issues are to arise.  Otherwise she is doing well no wounds or signs of infection. -Patient encouraged to call the office with any questions, concerns, change in symptoms.   Trula Slade DPM

## 2019-09-26 ENCOUNTER — Encounter: Payer: Self-pay | Admitting: Physician Assistant

## 2019-09-26 ENCOUNTER — Ambulatory Visit (HOSPITAL_COMMUNITY)
Admission: RE | Admit: 2019-09-26 | Discharge: 2019-09-26 | Disposition: A | Discharge status: Home / Self Care | Payer: Medicare Other | Source: Ambulatory Visit | Attending: Vascular Surgery | Admitting: Vascular Surgery

## 2019-09-26 ENCOUNTER — Ambulatory Visit (INDEPENDENT_AMBULATORY_CARE_PROVIDER_SITE_OTHER): Payer: Medicare Other | Admitting: Physician Assistant

## 2019-09-26 ENCOUNTER — Other Ambulatory Visit: Payer: Self-pay

## 2019-09-26 VITALS — BP 141/67 | HR 79 | Temp 97.7°F | Resp 20 | Ht 68.0 in | Wt 243.7 lb

## 2019-09-26 DIAGNOSIS — N186 End stage renal disease: Secondary | ICD-10-CM | POA: Diagnosis not present

## 2019-09-26 DIAGNOSIS — Z992 Dependence on renal dialysis: Secondary | ICD-10-CM

## 2019-09-26 NOTE — H&P (View-Only) (Signed)
POST OPERATIVE OFFICE NOTE    CC:  F/u for surgery  HPI:  This is a 54 y.o. female who is s/p right first stage basilic on 08/13/19 by Dr. Fields.  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 Most recently she had a right upper arm AV graft placed February 2021. This apparently was declotted once at [facility]. It was then subsequently declotted again a few days later and then abandoned.   Pt returns today for follow up.  She denise pain, loss of motor and loss of sensation.    Allergies  Allergen Reactions  . Bupropion Itching    Current Outpatient Medications  Medication Sig Dispense Refill  . AURYXIA 1 GM 210 MG(Fe) tablet Take 630-1,050 mg by mouth See admin instructions. Take 5 tablets (1050 mg) by mouth with each meal & take 3 tablets (630 mg) by mouth with each snack    . b complex-vitamin c-folic acid (NEPHRO-VITE) 0.8 MG TABS tablet Take 1 tablet by mouth daily.    . blood glucose meter kit and supplies KIT Dispense based on patient and insurance preference. Use up to four times daily as directed. (FOR ICD-9 250.00, 250.01). 1 each 0  . hydrOXYzine (ATARAX/VISTARIL) 10 MG tablet Take 10 mg by mouth daily.   1  . Insulin Lispro Prot & Lispro (HUMALOG MIX 75/25 KWIKPEN) (75-25) 100 UNIT/ML Kwikpen Inject 30 Units into the skin 2 (two) times daily with a meal.     . iron sucrose in sodium chloride 0.9 % 100 mL Iron Sucrose (Venofer)    . Methoxy PEG-Epoetin Beta (MIRCERA IJ) Mircera    . metoprolol succinate (TOPROL-XL) 100 MG 24 hr tablet Take 100 mg by mouth daily. Take with or immediately following a meal.     . neomycin-polymyxin b-dexamethasone (MAXITROL) 3.5-10000-0.1 OINT     . Oxycodone HCl 10 MG TABS Take 10 mg by mouth 3 (three) times daily as needed (pain).     . pramipexole (MIRAPEX) 0.5 MG tablet Take 0.5 mg by mouth daily.   1  . rosuvastatin (CRESTOR) 40 MG  tablet Take 1 tablet (40 mg total) by mouth daily. 90 tablet 1  . traZODone (DESYREL) 50 MG tablet Take 50 mg by mouth at bedtime.    . VITAMIN D PO Take by mouth.     No current facility-administered medications for this visit.   Facility-Administered Medications Ordered in Other Visits  Medication Dose Route Frequency Provider Last Rate Last Admin  . 0.9 %  sodium chloride infusion  100 mL Intravenous PRN Ejigiri, Ogechi Grace, PA-C   New Bag at 08/13/19 0953  . 0.9 %  sodium chloride infusion  100 mL Intravenous PRN Ejigiri, Ogechi Grace, PA-C      . alteplase (CATHFLO ACTIVASE) injection 2 mg  2 mg Intracatheter Once PRN Ejigiri, Ogechi Grace, PA-C      . heparin injection 1,000 Units  1,000 Units Dialysis PRN Ejigiri, Ogechi Grace, PA-C      . heparin injection 10,900 Units  100 Units/kg Dialysis PRN Ejigiri, Ogechi Grace, PA-C      . heparin injection 10,900 Units  100 Units/kg Dialysis PRN Ejigiri, Ogechi Grace, PA-C      . lidocaine (PF) (XYLOCAINE) 1 % injection 5 mL  5 mL Intradermal PRN Ejigiri, Ogechi Grace, PA-C      . lidocaine-prilocaine (EMLA) cream 1 application  1 application   Topical PRN Ejigiri, Ogechi Grace, PA-C      . pentafluoroprop-tetrafluoroeth (GEBAUERS) aerosol 1 application  1 application Topical PRN Ejigiri, Ogechi Grace, PA-C         ROS:  See HPI  Physical Exam:  Findings:  +--------------------+----------+-----------------+--------+  AVF         PSV (cm/s)Flow Vol (mL/min)Comments  +--------------------+----------+-----------------+--------+  Native artery inflow  141      662          +--------------------+----------+-----------------+--------+  AVF Anastomosis     118                 +--------------------+----------+-----------------+--------+     +------------+----------+-------------+-----------+--------+  OUTFLOW VEINPSV (cm/s)Diameter (cm)Depth (cm) Describe    +------------+----------+-------------+-----------+--------+  Prox UA    62 / 37  0.64 / 0.61 0.48 / 0.57 joins   +------------+----------+-------------+-----------+--------+  Mid UA     120    0.38     1.02        +------------+----------+-------------+-----------+--------+  Dist UA     71    0.44     0.48        +------------+----------+-------------+-----------+--------+        Summary:  Patent arteriovenous fistula with decreased velocity proximally. .   Incision:  Right medial vein harvest incisions are well healed. Palpable radial pulse Grip 5/5, well healed  Audible thrill throughout the fistula, but not palpable.     Assessment/Plan:  This is a 54 y.o. female who is s/p right UE:single stage basilic fistula creation 08/13/19 by Dr. Fields.  Previous left UE steal with loss of thumb that has healed at this point.   I will schedule her for fistulogram and possible intervention.   The mid section of the fistula is < 038 in diameter and > than 1.0 in depth.  This may be a narrowed segment that can benefit from angioplasty.    Neill Jurewicz Maureen Anis Degidio PA-C  [facility] [phone]  Clinic MD:  [place] 

## 2019-09-26 NOTE — Progress Notes (Signed)
POST OPERATIVE OFFICE NOTE    CC:  F/u for surgery  HPI:  This is a 54 y.o. female who is s/p right first stage basilic on 08/13/19 by Dr. Fields.  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 Most recently she had a right upper arm AV graft placed February 2021. This apparently was declotted once at [facility]. It was then subsequently declotted again a few days later and then abandoned.   Pt returns today for follow up.  She denise pain, loss of motor and loss of sensation.    Allergies  Allergen Reactions  . Bupropion Itching    Current Outpatient Medications  Medication Sig Dispense Refill  . AURYXIA 1 GM 210 MG(Fe) tablet Take 630-1,050 mg by mouth See admin instructions. Take 5 tablets (1050 mg) by mouth with each meal & take 3 tablets (630 mg) by mouth with each snack    . b complex-vitamin c-folic acid (NEPHRO-VITE) 0.8 MG TABS tablet Take 1 tablet by mouth daily.    . blood glucose meter kit and supplies KIT Dispense based on patient and insurance preference. Use up to four times daily as directed. (FOR ICD-9 250.00, 250.01). 1 each 0  . hydrOXYzine (ATARAX/VISTARIL) 10 MG tablet Take 10 mg by mouth daily.   1  . Insulin Lispro Prot & Lispro (HUMALOG MIX 75/25 KWIKPEN) (75-25) 100 UNIT/ML Kwikpen Inject 30 Units into the skin 2 (two) times daily with a meal.     . iron sucrose in sodium chloride 0.9 % 100 mL Iron Sucrose (Venofer)    . Methoxy PEG-Epoetin Beta (MIRCERA IJ) Mircera    . metoprolol succinate (TOPROL-XL) 100 MG 24 hr tablet Take 100 mg by mouth daily. Take with or immediately following a meal.     . neomycin-polymyxin b-dexamethasone (MAXITROL) 3.5-10000-0.1 OINT     . Oxycodone HCl 10 MG TABS Take 10 mg by mouth 3 (three) times daily as needed (pain).     . pramipexole (MIRAPEX) 0.5 MG tablet Take 0.5 mg by mouth daily.   1  . rosuvastatin (CRESTOR) 40 MG  tablet Take 1 tablet (40 mg total) by mouth daily. 90 tablet 1  . traZODone (DESYREL) 50 MG tablet Take 50 mg by mouth at bedtime.    . VITAMIN D PO Take by mouth.     No current facility-administered medications for this visit.   Facility-Administered Medications Ordered in Other Visits  Medication Dose Route Frequency Provider Last Rate Last Admin  . 0.9 %  sodium chloride infusion  100 mL Intravenous PRN Ejigiri, Ogechi Grace, PA-C   New Bag at 08/13/19 0953  . 0.9 %  sodium chloride infusion  100 mL Intravenous PRN Ejigiri, Ogechi Grace, PA-C      . alteplase (CATHFLO ACTIVASE) injection 2 mg  2 mg Intracatheter Once PRN Ejigiri, Ogechi Grace, PA-C      . heparin injection 1,000 Units  1,000 Units Dialysis PRN Ejigiri, Ogechi Grace, PA-C      . heparin injection 10,900 Units  100 Units/kg Dialysis PRN Ejigiri, Ogechi Grace, PA-C      . heparin injection 10,900 Units  100 Units/kg Dialysis PRN Ejigiri, Ogechi Grace, PA-C      . lidocaine (PF) (XYLOCAINE) 1 % injection 5 mL  5 mL Intradermal PRN Ejigiri, Ogechi Grace, PA-C      . lidocaine-prilocaine (EMLA) cream 1 application  1 application   Topical PRN Ejigiri, Ogechi Grace, PA-C      . pentafluoroprop-tetrafluoroeth (GEBAUERS) aerosol 1 application  1 application Topical PRN Ejigiri, Ogechi Grace, PA-C         ROS:  See HPI  Physical Exam:  Findings:  +--------------------+----------+-----------------+--------+  AVF         PSV (cm/s)Flow Vol (mL/min)Comments  +--------------------+----------+-----------------+--------+  Native artery inflow  141      662          +--------------------+----------+-----------------+--------+  AVF Anastomosis     118                 +--------------------+----------+-----------------+--------+     +------------+----------+-------------+-----------+--------+  OUTFLOW VEINPSV (cm/s)Diameter (cm)Depth (cm) Describe    +------------+----------+-------------+-----------+--------+  Prox UA    62 / 37  0.64 / 0.61 0.48 / 0.57 joins   +------------+----------+-------------+-----------+--------+  Mid UA     120    0.38     1.02        +------------+----------+-------------+-----------+--------+  Dist UA     71    0.44     0.48        +------------+----------+-------------+-----------+--------+        Summary:  Patent arteriovenous fistula with decreased velocity proximally. .   Incision:  Right medial vein harvest incisions are well healed. Palpable radial pulse Grip 5/5, well healed  Audible thrill throughout the fistula, but not palpable.     Assessment/Plan:  This is a 54 y.o. female who is s/p right UE:single stage basilic fistula creation 08/13/19 by Dr. Fields.  Previous left UE steal with loss of thumb that has healed at this point.   I will schedule her for fistulogram and possible intervention.   The mid section of the fistula is < 038 in diameter and > than 1.0 in depth.  This may be a narrowed segment that can benefit from angioplasty.     Maureen  PA-C  [facility] [phone]  Clinic MD:  [place] 

## 2019-09-27 ENCOUNTER — Encounter (HOSPITAL_COMMUNITY): Payer: Medicare Other

## 2019-09-27 ENCOUNTER — Encounter: Payer: Medicare Other | Admitting: Vascular Surgery

## 2019-10-05 ENCOUNTER — Encounter: Payer: Self-pay | Admitting: *Deleted

## 2019-10-05 ENCOUNTER — Other Ambulatory Visit: Payer: Self-pay | Admitting: *Deleted

## 2019-10-24 ENCOUNTER — Other Ambulatory Visit (HOSPITAL_COMMUNITY)
Admission: RE | Admit: 2019-10-24 | Discharge: 2019-10-24 | Disposition: A | Discharge status: Home / Self Care | Payer: Medicare Other | Source: Ambulatory Visit | Attending: Vascular Surgery | Admitting: Vascular Surgery

## 2019-10-24 DIAGNOSIS — Z01812 Encounter for preprocedural laboratory examination: Secondary | ICD-10-CM | POA: Insufficient documentation

## 2019-10-24 DIAGNOSIS — Z20822 Contact with and (suspected) exposure to covid-19: Secondary | ICD-10-CM | POA: Insufficient documentation

## 2019-10-24 LAB — SARS CORONAVIRUS 2 (TAT 6-24 HRS): SARS Coronavirus 2: NEGATIVE

## 2019-10-26 ENCOUNTER — Other Ambulatory Visit: Payer: Self-pay

## 2019-10-26 ENCOUNTER — Encounter (HOSPITAL_COMMUNITY)
Admission: RE | Disposition: A | Discharge status: Home / Self Care | Payer: Self-pay | Source: Home / Self Care | Attending: Vascular Surgery

## 2019-10-26 ENCOUNTER — Ambulatory Visit (HOSPITAL_COMMUNITY)
Admission: RE | Admit: 2019-10-26 | Discharge: 2019-10-26 | Disposition: A | Discharge status: Home / Self Care | Payer: Medicare Other | Attending: Vascular Surgery | Admitting: Vascular Surgery

## 2019-10-26 DIAGNOSIS — Z886 Allergy status to analgesic agent status: Secondary | ICD-10-CM | POA: Diagnosis not present

## 2019-10-26 DIAGNOSIS — Z794 Long term (current) use of insulin: Secondary | ICD-10-CM | POA: Insufficient documentation

## 2019-10-26 DIAGNOSIS — T82898A Other specified complication of vascular prosthetic devices, implants and grafts, initial encounter: Secondary | ICD-10-CM

## 2019-10-26 DIAGNOSIS — Z89022 Acquired absence of left finger(s): Secondary | ICD-10-CM | POA: Insufficient documentation

## 2019-10-26 DIAGNOSIS — Y832 Surgical operation with anastomosis, bypass or graft as the cause of abnormal reaction of the patient, or of later complication, without mention of misadventure at the time of the procedure: Secondary | ICD-10-CM | POA: Diagnosis not present

## 2019-10-26 DIAGNOSIS — I770 Arteriovenous fistula, acquired: Secondary | ICD-10-CM | POA: Insufficient documentation

## 2019-10-26 DIAGNOSIS — Z79899 Other long term (current) drug therapy: Secondary | ICD-10-CM | POA: Diagnosis not present

## 2019-10-26 DIAGNOSIS — T82858A Stenosis of vascular prosthetic devices, implants and grafts, initial encounter: Secondary | ICD-10-CM | POA: Diagnosis not present

## 2019-10-26 DIAGNOSIS — N185 Chronic kidney disease, stage 5: Secondary | ICD-10-CM

## 2019-10-26 HISTORY — PX: PERIPHERAL VASCULAR INTERVENTION: CATH118257

## 2019-10-26 LAB — POCT I-STAT, CHEM 8
BUN: 39 mg/dL — ABNORMAL HIGH (ref 6–20)
Calcium, Ion: 1.19 mmol/L (ref 1.15–1.40)
Chloride: 94 mmol/L — ABNORMAL LOW (ref 98–111)
Creatinine, Ser: 6.3 mg/dL — ABNORMAL HIGH (ref 0.44–1.00)
Glucose, Bld: 173 mg/dL — ABNORMAL HIGH (ref 70–99)
HCT: 26 % — ABNORMAL LOW (ref 36.0–46.0)
Hemoglobin: 8.8 g/dL — ABNORMAL LOW (ref 12.0–15.0)
Potassium: 4 mmol/L (ref 3.5–5.1)
Sodium: 138 mmol/L (ref 135–145)
TCO2: 29 mmol/L (ref 22–32)

## 2019-10-26 SURGERY — PERIPHERAL VASCULAR INTERVENTION
Anesthesia: LOCAL | Laterality: Right

## 2019-10-26 MED ORDER — LIDOCAINE HCL (PF) 1 % IJ SOLN
INTRAMUSCULAR | Status: DC | PRN
  Administered 2019-10-26: 5 mL

## 2019-10-26 MED ORDER — OXYCODONE HCL 5 MG PO TABS: 5.0000 mg | ORAL_TABLET | ORAL | Status: DC | PRN

## 2019-10-26 MED ORDER — SODIUM CHLORIDE 0.9 % IV SOLN: 250.0000 mL | INTRAVENOUS | Status: DC | PRN

## 2019-10-26 MED ORDER — SODIUM CHLORIDE 0.9% FLUSH: 3.0000 mL | INTRAVENOUS | Status: DC | PRN

## 2019-10-26 MED ORDER — LIDOCAINE HCL (PF) 1 % IJ SOLN
INTRAMUSCULAR | Status: AC
  Filled 2019-10-26: qty 30

## 2019-10-26 MED ORDER — ONDANSETRON HCL 4 MG/2ML IJ SOLN: 4.0000 mg | Freq: Four times a day (QID) | INTRAMUSCULAR | Status: DC | PRN

## 2019-10-26 MED ORDER — HYDRALAZINE HCL 20 MG/ML IJ SOLN: 5.0000 mg | INTRAMUSCULAR | Status: DC | PRN

## 2019-10-26 MED ORDER — HEPARIN (PORCINE) IN NACL 1000-0.9 UT/500ML-% IV SOLN
INTRAVENOUS | Status: AC
  Filled 2019-10-26: qty 500

## 2019-10-26 MED ORDER — LABETALOL HCL 5 MG/ML IV SOLN: 10.0000 mg | INTRAVENOUS | Status: DC | PRN

## 2019-10-26 MED ORDER — ACETAMINOPHEN 325 MG PO TABS: 650.0000 mg | ORAL_TABLET | ORAL | Status: DC | PRN

## 2019-10-26 MED ORDER — SODIUM CHLORIDE 0.9% FLUSH: 3.0000 mL | Freq: Two times a day (BID) | INTRAVENOUS | Status: DC

## 2019-10-26 MED ORDER — MORPHINE SULFATE (PF) 2 MG/ML IV SOLN: 2.0000 mg | INTRAVENOUS | Status: DC | PRN

## 2019-10-26 MED ORDER — HEPARIN SODIUM (PORCINE) 1000 UNIT/ML IJ SOLN
INTRAMUSCULAR | Status: DC | PRN
  Administered 2019-10-26: 3000 [IU] via INTRAVENOUS

## 2019-10-26 MED ORDER — HEPARIN SODIUM (PORCINE) 1000 UNIT/ML IJ SOLN
INTRAMUSCULAR | Status: AC
  Filled 2019-10-26: qty 1

## 2019-10-26 MED ORDER — HEPARIN (PORCINE) IN NACL 1000-0.9 UT/500ML-% IV SOLN
INTRAVENOUS | Status: DC | PRN
  Administered 2019-10-26: 500 mL

## 2019-10-26 MED ORDER — IODIXANOL 320 MG/ML IV SOLN
INTRAVENOUS | Status: DC | PRN
  Administered 2019-10-26: 85 mL via INTRAVENOUS

## 2019-10-26 SURGICAL SUPPLY — 21 items
BAG SNAP BAND KOVER 36X36 (MISCELLANEOUS) ×2 IMPLANT
BALLN MUSTANG 12.0X40 75 (BALLOONS) ×2
BALLN MUSTANG 8.0X40 75 (BALLOONS) ×2
BALLN MUSTANG 8X20X75 (BALLOONS) ×2
BALLOON MUSTANG 12.0X40 75 (BALLOONS) IMPLANT
BALLOON MUSTANG 8.0X40 75 (BALLOONS) IMPLANT
BALLOON MUSTANG 8X20X75 (BALLOONS) IMPLANT
COVER DOME SNAP 22 D (MISCELLANEOUS) ×2 IMPLANT
KIT ENCORE 26 ADVANTAGE (KITS) ×1 IMPLANT
KIT MICROPUNCTURE NIT STIFF (SHEATH) ×1 IMPLANT
PROTECTION STATION PRESSURIZED (MISCELLANEOUS) ×2
SHEATH PINNACLE R/O II 7F 4CM (SHEATH) ×1 IMPLANT
SHEATH PROBE COVER 6X72 (BAG) ×2 IMPLANT
STATION PROTECTION PRESSURIZED (MISCELLANEOUS) ×1 IMPLANT
STENT VIABAHN 7X50X120 (Permanent Stent) ×4 IMPLANT
STENT VIABAHN 7X5X120 7FR (Permanent Stent) IMPLANT
STOPCOCK MORSE 400PSI 3WAY (MISCELLANEOUS) ×2 IMPLANT
TRAY PV CATH (CUSTOM PROCEDURE TRAY) ×2 IMPLANT
TUBING CIL FLEX 10 FLL-RA (TUBING) ×2 IMPLANT
WIRE G V18X300CM (WIRE) ×1 IMPLANT
WIRE STARTER BENTSON 035X150 (WIRE) ×1 IMPLANT

## 2019-10-26 NOTE — Discharge Instructions (Signed)

## 2019-10-26 NOTE — Op Note (Signed)
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 Angioplasty distal outflow vein at level of axilla 70% angioplastied to 0% residual stenosis  3. Angioplasty of proximal third of AV fistula with 8 x 40 balloon with resistant central segment angioplastied with 8 x 20 balloon with subsequent rupture of fistula treated with two 7 x 50 Viabahn stents  Operative details: After team informed consent, the patient taken the [place] lab. The patient was placed in supine position angio table. Patient's right upper extremities prepped and draped in usual sterile fashion. Local anesthesia was infiltrated over the proximal aspect of the fistula. Percent was used to identify the fistula and a micropuncture needle was used to cannulate this. Micropuncture wire was advanced into the fistula. Micropuncture sheath placed over this. Contrast angiogram was then obtained of the central veins and fistula. There is a 70% stenosis at the innominate superior vena cava junction. This extends over about 2 cm. There is also an additional narrowing of about 70% at the level of the axilla in the fistula. There is an additional diffuse narrowing of the proximal aspect of the fistula in its proximal third. This is about 70% stenosis. Patient was given 3000 intervention venous heparin. Micropuncture sheath was swapped out over an 035 Bentson wire for a 6 French short sheath. Then proceeded to angioplasty the central vein stenosis with a 12 x 40 angioplasty balloon to nominal pressure for 1 minute. Completion angiogram showed wide patency of the vein with no extravasation of contrast. I  then ballooned the axillary segment of the fistula with an 8 x 40 balloon. This was done to nominal pressure for 1 minute. Completion angiogram showed wide patency of this area. The more proximal portion of the fistula had a more tubular lengthy lesion. This was initially treated with an 8 x 40 balloon but there was still a very tight waist on the midportion that had been treated. I then switched over to an 8 x 20 balloon and we inflated this to 18 atm which eliminated the waist on the balloon. However completion angiogram showed extravasation of contrast at the area of angioplasty. Direct pressure was held over the proximal inflow the 6 French sheath was swapped out for a 7 [place] short sheath while the 035 wire was swapped out for an 018 wire and a 7 x 50 Viabahn stent deployed at the area of extravasation. Completion angiogram showed still persistent extravasation so this was extended proximally with an additional 7 x 50 Viabahn stent. There was still a small amount of contrast extravasation but we reballooned this segment for about 2 minutes and the contrast extravasation had ceased. There was still some narrowing of the proximal Viabahn so this was inflated to 8 atm for 1 minute and completion angiogram showed wide patency of both stents with the central outflow and no further extravasation of contrast. At this point the sheath was removed and hemostasis obtained with 2 figure-of-eight Monocryl stitches at the insertion site. The patient tolerated the procedure well she did have a small hematoma about 4 x 4 centimeter diameter with no obvious skin compromise in the midportion of the fistula. This did not appear to be  continuing to expand. Patient was taken to the holding area in stable condition.  Complications: Rupture proximal third right arm AV fistula repaired with covered stent  Operative management: Patient will be scheduled for a follow-up visit in 2 weeks for a duplex ultrasound of her right upper  arm AV fistula. We will determine at that point whether or not is going to be usable for an access. She will continue to use her right-sided dialysis catheter in the meanwhile.  Ruta Hinds, MD [facility] [place] Office: [phone]

## 2019-10-26 NOTE — Interval H&P Note (Signed)
History and Physical Interval Note:  10/26/2019 10:16 AM  [name]  has presented today for surgery, with the diagnosis of end stage renal.  The various methods of treatment have been discussed with the patient and family. After consideration of risks, benefits and other options for treatment, the patient has consented to  Procedure(s): A/V FISTULAGRAM (Right) as a surgical intervention.  The patient's history has been reviewed, patient examined, no change in status, stable for surgery.  I have reviewed the patient's chart and labs.  Questions were answered to the patient's satisfaction.     Ruta Hinds

## 2019-10-29 ENCOUNTER — Encounter (HOSPITAL_COMMUNITY): Payer: Self-pay | Admitting: Vascular Surgery

## 2019-10-29 ENCOUNTER — Other Ambulatory Visit: Payer: Self-pay

## 2019-10-29 DIAGNOSIS — N186 End stage renal disease: Secondary | ICD-10-CM

## 2019-11-08 ENCOUNTER — Encounter (HOSPITAL_COMMUNITY): Payer: Medicare Other

## 2019-11-12 ENCOUNTER — Other Ambulatory Visit: Payer: Self-pay

## 2019-11-12 ENCOUNTER — Ambulatory Visit (INDEPENDENT_AMBULATORY_CARE_PROVIDER_SITE_OTHER): Payer: Self-pay | Admitting: Physician Assistant

## 2019-11-12 ENCOUNTER — Ambulatory Visit (HOSPITAL_COMMUNITY)
Admission: RE | Admit: 2019-11-12 | Discharge: 2019-11-12 | Disposition: A | Discharge status: Home / Self Care | Payer: Medicare Other | Source: Ambulatory Visit | Attending: Vascular Surgery | Admitting: Vascular Surgery

## 2019-11-12 VITALS — BP 136/65 | HR 65 | Temp 97.7°F

## 2019-11-12 DIAGNOSIS — N186 End stage renal disease: Secondary | ICD-10-CM

## 2019-11-12 DIAGNOSIS — Z992 Dependence on renal dialysis: Secondary | ICD-10-CM

## 2019-11-12 NOTE — Progress Notes (Signed)
POST OPERATIVE DIALYSIS ACCESS OFFICE NOTE    CC:  F/u for dialysis access surgery  HPI:  This is a 54 y.o. female who is s/p right upper extremity basilic vein transposition fistula on August 13, 2019 by Dr. Oneida Alar.   She had had a previously placed left upper arm AV graft by Dr. Oneida Alar on March 12, 2019, which thrombosed after thrombectomy at [facility].  Graft had to be abandoned.  Her BVT fistula failed to mature and she was taken to the peripheral vascular lab on October 26, 2019 and underwent right arm fistulogram, angioplasty of the right innominate vein, angioplasty of the right upper arm AV fistula and placement of a 7 mm Viabahn stent x2.  She presents today for follow-up duplex ultrasound to determine whether her fistula is accessible.  She is currently dialyzing via right IJ tunneled dialysis catheter without complications. She denies right hand pain, fever or chills.    Dialysis days:  Tuesdays, Thursdays and Saturdays   Dialysis center:  [facility]  Allergies  Allergen Reactions   Bupropion Itching    Current Outpatient Medications  Medication Sig Dispense Refill   AURYXIA 1 GM 210 MG(Fe) tablet Take 630-1,050 mg by mouth See admin instructions. Take 5 tablets (1050 mg) by mouth with each meal & take 3 tablets (630 mg) by mouth with each snack     b complex-vitamin c-folic acid (NEPHRO-VITE) 0.8 MG TABS tablet Take 1 tablet by mouth Every Tuesday,Thursday,and Saturday with dialysis.      blood glucose meter kit and supplies KIT Dispense based on patient and insurance preference. Use up to four times daily as directed. (FOR ICD-9 250.00, 250.01). 1 each 0   HUMIRA PEN 40 MG/0.4ML PNKT SMARTSIG:40 Milligram(s) SUB-Q Every 2 Weeks     hydrOXYzine (ATARAX/VISTARIL) 25 MG tablet Take 25 mg by mouth daily.     Insulin Lispro Prot & Lispro (HUMALOG MIX 75/25 KWIKPEN) (75-25) 100 UNIT/ML Kwikpen Inject 30 Units into the skin 2 (two) times daily with a  meal.      Methoxy PEG-Epoetin Beta (MIRCERA IJ) Mircera     metoprolol succinate (TOPROL-XL) 100 MG 24 hr tablet Take 100 mg by mouth daily. Take with or immediately following a meal.      NARCAN 4 MG/0.1ML LIQD nasal spray kit Place 1 spray into the nose as needed (accidental overdose.).      oxyCODONE (OXY IR/ROXICODONE) 5 MG immediate release tablet Take 10 mg by mouth 3 (three) times daily as needed (pain.).      pramipexole (MIRAPEX) 0.5 MG tablet Take 0.5 mg by mouth daily.   1   rosuvastatin (CRESTOR) 40 MG tablet Take 1 tablet (40 mg total) by mouth daily. 90 tablet 1   No current facility-administered medications for this visit.   Facility-Administered Medications Ordered in Other Visits  Medication Dose Route Frequency Provider Last Rate Last Admin   0.9 %  sodium chloride infusion  100 mL Intravenous PRN Lynnda Child, PA-C   New Bag at 08/13/19 0953   0.9 %  sodium chloride infusion  100 mL Intravenous PRN Lynnda Child, PA-C       alteplase (CATHFLO ACTIVASE) injection 2 mg  2 mg Intracatheter Once PRN Lynnda Child, PA-C       heparin injection 1,000 Units  1,000 Units Dialysis PRN Lynnda Child, PA-C       heparin injection 10,900 Units  100 Units/kg Dialysis PRN Lynnda Child, PA-C  heparin injection 10,900 Units  100 Units/kg Dialysis PRN Lynnda Child, PA-C       lidocaine (PF) (XYLOCAINE) 1 % injection 5 mL  5 mL Intradermal PRN Lynnda Child, PA-C       lidocaine-prilocaine (EMLA) cream 1 application  1 application Topical PRN Ejigiri, Thomos Lemons, PA-C       pentafluoroprop-tetrafluoroeth (GEBAUERS) aerosol 1 application  1 application Topical PRN Lynnda Child, PA-C         ROS:  See HPI  LMP  (LMP Unknown) Comment: LMP Feb 2011   Physical Exam:  General appearance: Well-developed, well-nourished in no apparent distress Cardiac: Rate and rhythm regular Respiratory:  Nonlabored Incision: Well-healed Extremities: The fistula has a good bruit and thrill and is palpable along its course.  She has a palpable 2+ radial pulse and 5/5 hand grip strength.  Sensation is intact.  Dialysis duplex on 11/12/2019 OUTFLOW VEIN PSV (cm/s) Diameter (cm) Depth (cm) Describe   +------------+----------+-------------+----------+--------+   Prox UA      184     0.67      0.30          +------------+----------+-------------+----------+--------+   Mid UA      178     0.67      0.64          +------------+----------+-------------+----------+--------+   Dist UA      254     0.73      0.45          +------------+----------+-------------+----------+--------+   AC Fossa     207     0.64      0.42          +------------+----------+-------------+----------+--------+    Summary:  Patent arteriovenous fistula.   Assessment/Plan:   -pt does not have evidence of steal syndrome -dialysis duplex today reveals fistula to be of adequate diameter and depth for access -the fistula/graft may be accessed November 26, 2019 -Removal of right IJ [facility] at the discretion of her nephrology team by interventional radiology  Barbie Banner, PA-C 11/12/2019 3:56 PM [facility] [phone]  Clinic MD: Trula Slade

## 2019-12-07 ENCOUNTER — Encounter: Payer: Self-pay | Admitting: Podiatry

## 2019-12-07 ENCOUNTER — Ambulatory Visit (INDEPENDENT_AMBULATORY_CARE_PROVIDER_SITE_OTHER): Payer: Medicare Other | Admitting: Podiatry

## 2019-12-07 ENCOUNTER — Other Ambulatory Visit: Payer: Self-pay

## 2019-12-07 DIAGNOSIS — I739 Peripheral vascular disease, unspecified: Secondary | ICD-10-CM | POA: Diagnosis not present

## 2019-12-07 DIAGNOSIS — L84 Corns and callosities: Secondary | ICD-10-CM | POA: Diagnosis not present

## 2019-12-07 DIAGNOSIS — E1142 Type 2 diabetes mellitus with diabetic polyneuropathy: Secondary | ICD-10-CM | POA: Diagnosis not present

## 2019-12-07 DIAGNOSIS — B351 Tinea unguium: Secondary | ICD-10-CM

## 2019-12-07 DIAGNOSIS — E119 Type 2 diabetes mellitus without complications: Secondary | ICD-10-CM

## 2019-12-09 NOTE — Progress Notes (Signed)
ANNUAL DIABETIC FOOT EXAM  Subjective: [name] presents today for for annual diabetic foot examination, at risk foot care with history of diabetic neuropathy and painful callus(es) right foot and painful thick toenails that are difficult to trim. Painful toenails interfere with ambulation. Aggravating factors include wearing enclosed shoe gear. Pain is relieved with periodic professional debridement. Painful calluses are aggravated when weightbearing with and without shoegear. Pain is relieved with periodic professional debridement..  Patient relates 24 year h/o diabetes.  Patient has previous h/o foot wound of right hallux treated in [place], [place]. This has healed..  Patient does have symptoms of foot numbness.  Patient denies symptoms of foot tingling.  Patient denies symptoms of burning in feet.  Patient did not check blood glucose this morning and has not checked blood sugar in a week.  She is on hemodialysis on TTS.  Her son is present during today's visit.  Sandi Mariscal, MD is patient's PCP.   Past Medical History:  Diagnosis Date  . Anemia   . Anxiety   . Arthritis    "knees" "hands", "RA"  . CAD (coronary artery disease)    Nonobstructive on CT 2019  . CHF (congestive heart failure) ([place])   . COVID-19 10/2018  . Dyspnea    "when I have too much fluid."  . ESRD (end stage renal disease) ([place])    Dialysis TTHSat- 3rd st  . Headache(784.0)   . Heart murmur   . High cholesterol   . History of blood transfusion   . Hypertension   . Mitral regurgitation    moderate to severe MR 10/2018 echo  . 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 Pericardial effusion   . Pneumonia    ; 11/16/2018- "touch of pneumonia" - seen in ED- 11/14/2018- on antibiotic. Has had it x 2  . PVD (peripheral vascular disease) ([place])    Right leg stent in [place].  (No records)  . Restless legs   . Sleep apnea    does not use Cpap  . Thoracic ascending aortic  aneurysm (HCC)    4.4 cm 11/14/18 CTA  . Type II diabetes mellitus [facility])    Patient Active Problem List   Diagnosis Date Noted  . Pain, unspecified 03/20/2019  . Preop cardiovascular exam 12/19/2018  . Dyslipidemia 11/20/2018  . Shortness of breath 10/23/2018  . Acute respiratory distress 10/23/2018  . Chronic bilateral pleural effusions 10/23/2018  . Obesity 10/23/2018  . Coronary artery disease 10/23/2018  . Tobacco abuse 10/23/2018  . Volume overload 10/23/2018  . Unspecified protein-calorie malnutrition ([place]) 10/11/2018  . Pneumonia 10/10/2018  . COVID-19 virus detected 10/10/2018  . Acute encephalopathy 10/09/2018  . ESRD (end stage renal disease) ([place]) 09/27/2018  . Acute respiratory failure with hypoxia ([place]) 09/26/2018  . Chronic pain disorder 09/26/2018  . Other encephalopathy 09/26/2018  . COVID-19 virus infection 09/18/2018  . Acute metabolic encephalopathy 47/82/9562  . Gangrene ([place]) 09/18/2018  . Allergy, unspecified, initial encounter 09/04/2018  . Anaphylactic shock, unspecified, initial encounter 09/04/2018  . Left arm pain 06/27/2018  . Educated about COVID-19 virus infection 06/27/2018  . Iron deficiency anemia, unspecified 01/10/2018  . Coagulation defect, unspecified ([place]) 12/06/2017  . Diarrhea, unspecified 10/11/2017  . Anemia 09/26/2017  . Left hip pain 09/26/2017  . Neck pain 09/26/2017  . Leukocytosis 09/26/2017  . Pruritus, unspecified 09/22/2017  . Secondary hyperparathyroidism of renal origin ([place]) 09/01/2017  . Disorder of phosphorus metabolism, unspecified  08/25/2017  . Anemia in chronic kidney disease 08/24/2017  . Chest pain 07/07/2017  . Left ventricular hypertrophy 06/17/2017  . ESRD on dialysis ([place]) 05/21/2011  . Diabetic retinopathy 05/21/2011  . Diastolic dysfunction 84/69/6295  . Hyperlipidemia 01/30/2011  . Type II diabetes mellitus with complication, uncontrolled ([place]) 01/29/2011  . Neuropathy 01/29/2011  . Hypertension     Past Surgical History:  Procedure Laterality Date  . AMPUTATION Left 12/27/2018   Procedure: LEFT THUMB REVISION AMPUTATION DIGIT;  Surgeon: Charlotte Crumb, MD;  Location: [facility];  Service: Orthopedics;  Laterality: Left;  . AV FISTULA PLACEMENT Left   . AV FISTULA PLACEMENT Right 03/12/2019   Procedure: INSERTION OF ARTERIOVENOUS (AV) GORE-TEX GRAFT ARM;  Surgeon: Elam Dutch, MD;  Location: [facility] OR;  Service: Vascular;  Laterality: Right;  . AV FISTULA PLACEMENT Right 08/13/2019   Procedure: RIGHT UPPER ARM ARTERIOVENOUS (AV) FISTULA CREATION WITH BASILIC VEIN;  Surgeon: Elam Dutch, MD;  Location: [facility];  Service: Vascular;  Laterality: Right;  . [place]; 1997   x 2  . COLONOSCOPY    . EYE SURGERY Bilateral    cataract surgery  . INSERTION OF DIALYSIS CATHETER Right 09/26/2018   Procedure: INSERTION OF DIALYSIS CATHETER Right Internal Jugular.;  Surgeon: Elam Dutch, MD;  Location: [facility];  Service: Vascular;  Laterality: Right;  . IR FLUORO GUIDE CV LINE RIGHT  05/04/2019  . IR US GUIDE VASC ACCESS RIGHT  05/04/2019  . LIGATION OF ARTERIOVENOUS  FISTULA Left 09/26/2018   Procedure: LIGATION OF ARTERIOVENOUS  FISTULA LEFT ARM;  Surgeon: Elam Dutch, MD;  Location: [facility];  Service: Vascular;  Laterality: Left;  . PERICARDIAL WINDOW  02/2004   for pericardial effusion  . PERIPHERAL VASCULAR INTERVENTION Right 10/26/2019   Procedure: PERIPHERAL VASCULAR INTERVENTION;  Surgeon: Elam Dutch, MD;  Location: [facility] CV LAB;  Service: Cardiovascular;  Laterality: Right;  arm  AV fistula  . TUBAL LIGATION  05/1995  . UPPER EXTREMITY VENOGRAPHY N/A 06/22/2019   Procedure: UPPER EXTREMITY VENOGRAPHY - Right [place];  Surgeon: Elam Dutch, MD;  Location: [facility] CV LAB;  Service: Cardiovascular;  Laterality: N/A;   Current Outpatient Medications on File Prior to Visit  Medication Sig Dispense Refill  . amLODipine (NORVASC) 2.5 MG tablet SMARTSIG:1  Tablet(s) By Mouth Every Evening    . AURYXIA 1 GM 210 MG(Fe) tablet Take 630-1,050 mg by mouth See admin instructions. Take 5 tablets (1050 mg) by mouth with each meal & take 3 tablets (630 mg) by mouth with each snack    . b complex-vitamin c-folic acid (NEPHRO-VITE) 0.8 MG TABS tablet Take 1 tablet by mouth Every Tuesday,Thursday,and Saturday with dialysis.     Marland Kitchen blood glucose meter kit and supplies KIT Dispense based on patient and insurance preference. Use up to four times daily as directed. (FOR ICD-9 250.00, 250.01). 1 each 0  . HUMIRA PEN 40 MG/0.4ML PNKT SMARTSIG:40 Milligram(s) SUB-Q Every 2 Weeks    . hydrOXYzine (ATARAX/VISTARIL) 25 MG tablet Take 25 mg by mouth daily.    . Insulin Lispro Prot & Lispro (HUMALOG MIX 75/25 KWIKPEN) (75-25) 100 UNIT/ML Kwikpen Inject 30 Units into the skin 2 (two) times daily with a meal.     . Methoxy PEG-Epoetin Beta (MIRCERA IJ) Mircera    . metoprolol succinate (TOPROL-XL) 100 MG 24 hr tablet Take 100 mg by mouth daily. Take with or immediately following a meal.     .  NARCAN 4 MG/0.1ML LIQD nasal spray kit Place 1 spray into the nose as needed (accidental overdose.).     Marland Kitchen Nutritional Supplements (VITAMIN D BOOSTER PO) Take by mouth.    . oxyCODONE (OXY IR/ROXICODONE) 5 MG immediate release tablet Take 10 mg by mouth 3 (three) times daily as needed (pain.).     Marland Kitchen pramipexole (MIRAPEX) 0.5 MG tablet Take 0.5 mg by mouth daily.   1  . rosuvastatin (CRESTOR) 40 MG tablet Take 1 tablet (40 mg total) by mouth daily. 90 tablet 1   No current facility-administered medications on file prior to visit.    Allergies  Allergen Reactions  . Bupropion Itching   Social History   Occupational History  . Not on file  Tobacco Use  . Smoking status: Former Smoker    Years: 33.00    Types: Cigarettes    Quit date: 07/17/2019    Years since quitting: 0.3  . Smokeless tobacco: Never Used  . Tobacco comment: using NIcotene gum, rare 1/2 cigarette  Vaping Use   . Vaping Use: Never used  Substance and Sexual Activity  . Alcohol use: No  . Drug use: No  . Sexual activity: Not Currently    Birth control/protection: Post-menopausal   Family History  Problem Relation Age of Onset  . Cancer Brother   . Heart disease Father        Died age 49  . Diabetes Father   . Hyperlipidemia Father   . Hypertension Father   . Stroke Father   . Diabetes Mother   . Hypertension Mother   . Stroke Mother   . Dementia Mother   . Diabetes Sister   . Diabetes Brother   . Hypertension Sister   . Hypertension Brother    Immunization History  Administered Date(s) Administered  . Hepatitis B, adult 05/29/2012  . Influenza,inj,Quad PF,6+ Mos 10/31/2018  . Moderna SARS-COVID-2 Vaccination 04/20/2019, 05/18/2019  . Pneumococcal Conjugate-13 11/17/2017  . Pneumococcal Polysaccharide-23 11/03/2016     Review of Systems: Negative except as noted in the HPI.  Objective: There were no vitals filed for this visit.  LADONNE SHARPLES is a pleasant 54 y.o. female in NAD. AAO X 3.  Vascular Examination: Capillary fill time to digits <3 seconds b/l lower extremities. Faintly palpable pedal pulses b/l. Pedal hair absent. Lower extremity skin temperature gradient within normal limits. No pain with calf compression b/l. Nonpitting edema noted b/l lower extremities. No ischemia or gangrene noted b/l lower extremities.  Dermatological Examination: Pedal skin with normal turgor, texture and tone bilaterally. No open wounds bilaterally. No interdigital macerations bilaterally. Toenails 1-5 right, L hallux, L 2nd toe, L 4th toe and L 5th toe elongated, discolored, dystrophic, thickened, and crumbly with subungual debris and tenderness to dorsal palpation. Anonychia noted L 3rd toe. Nailbed(s) epithelialized.  Hyperkeratotic lesion(s) R hallux and submet head 1 right foot.  No erythema, no edema, no drainage, no fluctuance.  Musculoskeletal Examination: Normal muscle  strength 5/5 to all lower extremity muscle groups bilaterally. No pain crepitus or joint limitation noted with ROM b/l.  Footwear Assessment: Does the patient wear appropriate shoes? Yes Does the patient need inserts/orthotics? Yes.  Neurological Examination: Protective sensation decreased with 10 gram monofilament b/l.  LOWER EXTREMITY DOPPLER STUDY 01/11/2019  Indications: Pre-op.    Performing Technologist: Kathrine Comfort RVT, RDCS     Examination Guidelines: A complete evaluation includes at minimum, Doppler  waveform signals and systolic blood pressure reading at the level of  bilateral  brachial, anterior tibial, and posterior tibial arteries, when vessel  segments  are accessible. Bilateral testing is considered an integral part of a  complete  examination. Photoelectric Plethysmograph (PPG) waveforms and toe systolic  pressure readings are included as required and additional duplex testing  as  needed. Limited examinations for reoccurring indications may be performed  as  noted.   ABI Findings:  +---------+------------------+-----+---------+--------+  Right  Rt Pressure (mmHg)IndexWaveform Comment   +---------+------------------+-----+---------+--------+  Brachial 137                      +---------+------------------+-----+---------+--------+  PTA   90        0.66 biphasic       +---------+------------------+-----+---------+--------+  DP    114        0.83 triphasic      +---------+------------------+-----+---------+--------+  Great Toe73        0.53 Abnormal       +---------+------------------+-----+---------+--------+   +---------+------------------+-----+----------+----------+  Left   Lt Pressure (mmHg)IndexWaveform Comment    +---------+------------------+-----+----------+----------+  Brachial                  Failed AVF   +---------+------------------+-----+----------+----------+  PTA   123        0.90 biphasic        +---------+------------------+-----+----------+----------+  DP    80        0.58 monophasic       +---------+------------------+-----+----------+----------+  Great Toe87        0.64 Abnormal        +---------+------------------+-----+----------+----------+   Summary:  Right: Resting right ankle-brachial index indicates mild right lower  extremity arterial disease. The right toe-brachial index is abnormal.   Left: Resting left ankle-brachial index indicates mild left lower  extremity arterial disease. The left toe-brachial index is abnormal.   Assessment: 1. Dermatophytosis of nail   2. Corns and callosities   3. Diabetic peripheral neuropathy associated with type 2 diabetes mellitus ([place])   4. PAD (peripheral artery disease) ([place])   5. Encounter for diabetic foot exam ([place])    ADA Risk Categorization: High Risk  Patient has one or more of the following: Loss of protective sensation Absent pedal pulses Severe Foot deformity History of foot ulcer  Plan: -Examined patient. -No new findings. No new orders. -Diabetic foot examination performed on today's visit. -Patient to continue soft, supportive shoe gear daily. Start procedure for diabetic shoes. Patient qualifies based on diagnoses. -Toenails 1-5 right, L hallux, L 2nd toe, L 4th toe and L 5th toe debrided in length and girth without iatrogenic bleeding with sterile nail nipper and dremel.  -Callus(es) R hallux and submet head 1 right foot pared utilizing sterile scalpel blade without complication or incident. Total number debrided =2. -Patient to report any pedal injuries to medical professional immediately. -Patient/POA to call should there be question/concern in the interim.  Return in about 3 months (around 03/08/2020) for diabetic foot care.  Marzetta Board, DPM

## 2019-12-17 ENCOUNTER — Other Ambulatory Visit: Payer: Medicare Other | Admitting: Orthotics

## 2019-12-19 ENCOUNTER — Telehealth: Payer: Self-pay

## 2019-12-19 ENCOUNTER — Encounter: Payer: Self-pay | Admitting: Neurology

## 2019-12-19 ENCOUNTER — Institutional Professional Consult (permissible substitution): Payer: Medicare Other | Admitting: Neurology

## 2019-12-19 NOTE — Telephone Encounter (Signed)
Pt did not show for their appt with Dr. Athar today.  

## 2019-12-24 ENCOUNTER — Other Ambulatory Visit: Payer: Self-pay | Admitting: Cardiology

## 2020-01-24 ENCOUNTER — Other Ambulatory Visit: Payer: Self-pay | Admitting: Cardiology

## 2020-02-13 ENCOUNTER — Telehealth: Payer: Self-pay | Admitting: *Deleted

## 2020-02-13 ENCOUNTER — Inpatient Hospital Stay: Payer: Medicare Other | Admitting: Hematology

## 2020-02-13 ENCOUNTER — Inpatient Hospital Stay: Payer: Medicare Other

## 2020-02-13 NOTE — Telephone Encounter (Signed)
Needs to reschedule on a Tuesday/Thursday for labs and Dr. Irene Limbo. Has dialysis on M-W-F. Schedule message sent

## 2020-02-13 NOTE — Progress Notes (Incomplete)
HEMATOLOGY/ONCOLOGY CONSULTATION NOTE  Date of Service: 02/13/2020  Patient Care Team: Sandi Mariscal, MD as PCP - General (Internal Medicine) Minus Breeding, MD as PCP - Cardiology (Cardiology) Leandrew Koyanagi, MD as Attending Physician (Orthopedic Surgery) [facility]  CHIEF COMPLAINTS/PURPOSE OF CONSULTATION:  Lymphadenopathy/Enlarged Spleen/Splenic Lesions  HISTORY OF PRESENTING ILLNESS: ROSAMOND ANDRESS is a wonderful 55 y.o. female who has been referred to [place] by Dr. Harrie Jeans for evaluation and management of lymphadenopathy/enlarged spleen/splenic lesions. Winfred Leeds is a wonderful 55 y.o. female who has been referred to [place] by Dr. Harrie Jeans for evaluation and management of lymphadenopathy/enlarged spleen/splenic lesions. The pt reports that she is doing well overall.  The pt reports that she was experiencing a COVID19 infection last August. She is currently following with Dr. Percival Spanish for her mitral regurgitation and Dr. Harrie Jeans for her CKD. Pt is receiving dialysis 3-4 times per week and gets IV Iron with her treatments. Pt has been seen by Urology within the last 6 months. She is not currently begin followed for her sleep apnea or using a CPAP machine. Pt was following with a Rheumatologist who told the pt that she has severe RA. Pt was placed on Enbrel for 1 month, but had to discontinue after her left thumb became infected during her COVID19 infection. 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 Her RA is not currently being treated or managed.   Pt believes that her Diabetes is currently well-controlled. She has numbness and tingling in her lower extremities. Pt is receiving ocular Avastin injections to prevent bleeding behind the eyes. It is thought that she has abnormal blood vessels in the area due to Diabetes. Pt is smoking 1/2 ppd and is trying to quit. She had previously quit smoking for 7 months while using Chantix. Pt has previously  required a blood transfusion and has had no notable anemia since.   Of note prior to the patient's visit today, pt has had CT Abd/Pel ([id number]) completed on 10/13/2018 with results revealing "1. Three indeterminate splenic lesions, 2 measuring 5 cm and 1 measuring 6.5 cm. Differential includes multiple benign etiologies but lymphoma or metastasis are not excluded. 2. Indeterminate 2.5 cm RIGHT renal mass, but suspicious for solid mass/renal cell carcinoma. 3. Mild bilateral ground-glass opacities which may represent infection in this patient with known COVID-19. Stable low pelvic midline ventral hernia containing bowel loops. No evidence of bowel obstruction. 5. Cardiomegaly and Aortic Atherosclerosis (ICD10-I70.0)."   Pt has had CT Angio Chest ([id number]) completed on 11/14/2018 with results revealing "1. No demonstrable pulmonary embolus. 2. Dilatation of the main pulmonary outflow tract, a finding indicative of pulmonary arterial hypertension. 3. Ascending thoracic aortic prominence measuring 4.4 x 4.3 cm. No dissection evident. There are foci of aortic atherosclerosis as well as foci of great vessel and coronary artery calcification. There is left ventricular hypertrophy.  4. Adenopathy at several sites of uncertain etiology. Neoplastic etiology cannot be excluded. 5. Multiple areas of ground-glass type opacity, concerning for atypical organism pneumonia. Areas of atelectatic change also noted. Mild consolidation in the inferior lingula. 3 mm nodular opacity in the posterior segment right upper lobe noted. No follow-up needed if patient is low-risk. Non-contrast chest CT can be considered in 12 months if patient is high-risk. This recommendation follows the consensus statement: Guidelines for Management of Incidental Pulmonary Nodules Detected on CT Images: From the Fleischner Society 2017; Radiology 2017; 284:228-243. 6. Spleen appears prominent. Note that spleen is incompletely  visualized. Splenic  prominence as well as adenopathy potentially could be indicative of underlying lymphoma."  Most recent lab results (06/05/2019) of CBC w/diff and BMP is as follows: all values are WNL except for RBC at 2.92, Hgb at 9.7, HCT at 29.7, MCV at 101.7, Chloride at 95, Glucose at 241, BUN at 35, Creatinine at 6.02, GFR Est Af Am at 8.  On review of systems, pt reports watery eyes and denies fevers, chills, night sweats, unexpected weight loss, loss of appetite, abdominal pain and any other symptoms.   On PMHx the pt reports RA, CAD, COVID-19, HTN, Mitrial regurgitation, PVD, Sleep Apnea, CKD, DM II, Thoracic ascending aortic aneurysm, Left Thumb Amputation, AV Fistula Placement. On Social Hx the pt reports she smokes 1/2 ppd and is working on quitting.    INTERVAL HISTORY: SHATIQUA HEROUX is a wonderful 55 y.o. female who has been referred to [place] by Dr. Harrie Jeans for evaluation and management of lymphadenopathy/enlarged spleen/splenic lesions. The patient's last visit with [place] was on 08/16/2019. The pt reports that she is doing well overall.  The pt reports ***  Of note since the patient's last visit, pt has had *** completed on *** with results revealing ***.  Lab results today (02/13/20) of CBC w/diff and CMP is as follows: all values are WNL except for ***. 02/13/2020 LDH at ***  On review of systems, pt reports *** and denies ***and any other symptoms.   A&P: -Discussed pt labwork today, 02/13/20; *** -***   MEDICAL HISTORY:  Past Medical History:  Diagnosis Date  . Anemia   . Anxiety   . Arthritis    "knees" "hands", "RA"  . CAD (coronary artery disease)    Nonobstructive on CT 2019  . CHF (congestive heart failure) ([place])   . COVID-19 10/2018  . Dyspnea    "when I have too much fluid."  . ESRD (end stage renal disease) ([place])    Dialysis [facility]- 3rd st  . Headache(784.0)   . Heart murmur   . High cholesterol   . History of blood transfusion   . Hypertension   .  Mitral regurgitation    moderate to severe MR 10/2018 echo  . 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 Pericardial effusion   . Pneumonia    ; 11/16/2018- "touch of pneumonia" - seen in ED- 11/14/2018- on antibiotic. Has had it x 2  . PVD (peripheral vascular disease) ([place])    Right leg stent in [place].  (No records)  . Restless legs   . Sleep apnea    does not use Cpap  . Thoracic ascending aortic aneurysm ([facility])    4.4 cm 11/14/18 CTA  . Type II diabetes mellitus ([place])     SURGICAL HISTORY: Past Surgical History:  Procedure Laterality Date  . AMPUTATION Left 12/27/2018   Procedure: LEFT THUMB REVISION AMPUTATION DIGIT;  Surgeon: Charlotte Crumb, MD;  Location: [facility];  Service: Orthopedics;  Laterality: Left;  . AV FISTULA PLACEMENT Left   . AV FISTULA PLACEMENT Right 03/12/2019   Procedure: INSERTION OF ARTERIOVENOUS (AV) GORE-TEX GRAFT ARM;  Surgeon: Elam Dutch, MD;  Location: [facility] OR;  Service: Vascular;  Laterality: Right;  . AV FISTULA PLACEMENT Right 08/13/2019   Procedure: RIGHT UPPER ARM ARTERIOVENOUS (AV) FISTULA CREATION WITH BASILIC VEIN;  Surgeon: Elam Dutch, MD;  Location: [facility];  Service: Vascular;  Laterality: Right;  . [place]; 1997  x 2  . COLONOSCOPY    . EYE SURGERY Bilateral    cataract surgery  . INSERTION OF DIALYSIS CATHETER Right 09/26/2018   Procedure: INSERTION OF DIALYSIS CATHETER Right Internal Jugular.;  Surgeon: Elam Dutch, MD;  Location: [place];  Service: Vascular;  Laterality: Right;  . IR FLUORO GUIDE CV LINE RIGHT  05/04/2019  . IR US GUIDE VASC ACCESS RIGHT  05/04/2019  . LIGATION OF ARTERIOVENOUS  FISTULA Left 09/26/2018   Procedure: LIGATION OF ARTERIOVENOUS  FISTULA LEFT ARM;  Surgeon: Elam Dutch, MD;  Location: [place];  Service: Vascular;  Laterality: Left;  . PERICARDIAL WINDOW  02/2004   for pericardial effusion  . PERIPHERAL VASCULAR INTERVENTION Right 10/26/2019   Procedure:  PERIPHERAL VASCULAR INTERVENTION;  Surgeon: Elam Dutch, MD;  Location: [place];  Service: Cardiovascular;  Laterality: Right;  arm  AV fistula  . TUBAL LIGATION  05/1995  . UPPER EXTREMITY VENOGRAPHY N/A 06/22/2019   Procedure: UPPER EXTREMITY VENOGRAPHY - Right Central;  Surgeon: Elam Dutch, MD;  Location: [place];  Service: Cardiovascular;  Laterality: N/A;    SOCIAL HISTORY: Social History   Socioeconomic History  . Marital status: Married    Spouse name: Not on file  . Number of children: Not on file  . Years of education: Not on file  . Highest education level: Not on file  Occupational History  . Not on file  Tobacco Use  . Smoking status: Former Smoker    Years: 33.00    Types: Cigarettes    Quit date: 07/17/2019    Years since quitting: 0.5  . Smokeless tobacco: Never Used  . Tobacco comment: using NIcotene gum, rare 1/2 cigarette  Vaping Use  . Vaping Use: Never used  Substance and Sexual Activity  . Alcohol use: No  . Drug use: No  . Sexual activity: Not Currently    Birth control/protection: Post-menopausal  Other Topics Concern  . Not on file  Social History Narrative   Lives with son.        Social Determinants of Health   Financial Resource Strain: Not on file  Food Insecurity: Not on file  Transportation Needs: Not on file  Physical Activity: Not on file  Stress: Not on file  Social Connections: Not on file  Intimate Partner Violence: Not on file    FAMILY HISTORY: Family History  Problem Relation Age of Onset  . Cancer Brother   . Heart disease Father        Died age 47  . Diabetes Father   . Hyperlipidemia Father   . Hypertension Father   . Stroke Father   . Diabetes Mother   . Hypertension Mother   . Stroke Mother   . Dementia Mother   . Diabetes Sister   . Diabetes Brother   . Hypertension Sister   . Hypertension Brother     ALLERGIES:  is allergic to bupropion.  MEDICATIONS:  Current  Outpatient Medications  Medication Sig Dispense Refill  . amLODipine (NORVASC) 2.5 MG tablet SMARTSIG:1 Tablet(s) By Mouth Every Evening    . AURYXIA 1 GM 210 MG(Fe) tablet Take 630-1,050 mg by mouth See admin instructions. Take 5 tablets (1050 mg) by mouth with each meal & take 3 tablets (630 mg) by mouth with each snack    . b complex-vitamin c-folic acid (NEPHRO-VITE) 0.8 MG TABS tablet Take 1 tablet by mouth Every Tuesday,Thursday,and Saturday with dialysis.     Marland Kitchen  blood glucose meter kit and supplies KIT Dispense based on patient and insurance preference. Use up to four times daily as directed. (FOR ICD-9 250.00, 250.01). 1 each 0  . HUMIRA PEN 40 MG/0.4ML PNKT SMARTSIG:40 Milligram(s) SUB-Q Every 2 Weeks    . hydrOXYzine (ATARAX/VISTARIL) 25 MG tablet Take 25 mg by mouth daily.    . Insulin Lispro Prot & Lispro (HUMALOG MIX 75/25 KWIKPEN) (75-25) 100 UNIT/ML Kwikpen Inject 30 Units into the skin 2 (two) times daily with a meal.     . Methoxy PEG-Epoetin Beta (MIRCERA IJ) Mircera    . metoprolol succinate (TOPROL-XL) 100 MG 24 hr tablet Take 100 mg by mouth daily. Take with or immediately following a meal.     . NARCAN 4 MG/0.1ML LIQD nasal spray kit Place 1 spray into the nose as needed (accidental overdose.).     Marland Kitchen Nutritional Supplements (VITAMIN D BOOSTER PO) Take by mouth.    . oxyCODONE (OXY IR/ROXICODONE) 5 MG immediate release tablet Take 10 mg by mouth 3 (three) times daily as needed (pain.).     Marland Kitchen pramipexole (MIRAPEX) 0.5 MG tablet Take 0.5 mg by mouth daily.   1  . rosuvastatin (CRESTOR) 40 MG tablet TAKE 1 TABLET BY MOUTH EVERY DAY 15 tablet 0   No current facility-administered medications for this visit.    REVIEW OF SYSTEMS:   A 10+ POINT REVIEW OF SYSTEMS WAS OBTAINED including neurology, dermatology, psychiatry, cardiac, respiratory, lymph, extremities, GI, GU, Musculoskeletal, constitutional, breasts, reproductive, HEENT.  All pertinent positives are noted in the HPI.  All  others are negative.   PHYSICAL EXAMINATION: ECOG PERFORMANCE STATUS: 2 - Symptomatic, <50% confined to bed  . There were no vitals filed for this visit. There were no vitals filed for this visit. .There is no height or weight on file to calculate BMI.  *** GENERAL:alert, in no acute distress and comfortable SKIN: no acute rashes, no significant lesions EYES: conjunctiva are pink and non-injected, sclera anicteric OROPHARYNX: MMM, no exudates, no oropharyngeal erythema or ulceration NECK: supple, no JVD LYMPH:  no palpable lymphadenopathy in the cervical, axillary or inguinal regions LUNGS: clear to auscultation b/l with normal respiratory effort HEART: regular rate & rhythm ABDOMEN:  normoactive bowel sounds , non tender, not distended. No palpable hepatosplenomegaly.  Extremity: no pedal edema PSYCH: alert & oriented x 3 with fluent speech NEURO: no focal motor/sensory deficits  LABORATORY DATA:  I have reviewed the data as listed  . CBC Latest Ref Rng & Units 10/26/2019 08/13/2019 07/16/2019  WBC 4.0 - 10.5 K/uL - - 6.4  Hemoglobin 12.0 - 15.0 g/dL 8.8(L) 11.2(L) 10.2(L)  Hematocrit 36.0 - 46.0 % 26.0(L) 33.0(L) 29.8(L)  Platelets 150 - 400 K/uL - - 181    . CMP Latest Ref Rng & Units 10/26/2019 08/13/2019 07/16/2019  Glucose 70 - 99 mg/dL 173(H) 265(H) 356(H)  BUN 6 - 20 mg/dL 39(H) 50(H) 55(H)  Creatinine 0.44 - 1.00 mg/dL 6.30(H) 7.70(H) 7.07(HH)  Sodium 135 - 145 mmol/L 138 135 136  Potassium 3.5 - 5.1 mmol/L 4.0 4.3 4.2  Chloride 98 - 111 mmol/L 94(L) 94(L) 93(L)  CO2 22 - 32 mmol/L - - 25  Calcium 8.9 - 10.3 mg/dL - - 9.7  Total Protein 6.5 - 8.1 g/dL - - 8.0  Total Bilirubin 0.3 - 1.2 mg/dL - - 0.4  Alkaline Phos 38 - 126 U/L - - 110  AST 15 - 41 U/L - - 15  ALT 0 - 44 U/L - -  20     RADIOGRAPHIC STUDIES: I have personally reviewed the radiological images as listed and agreed with the findings in the report. No results found.  ASSESSMENT & PLAN:   55 yo  with   1) Axillary and mediastinal LNadenopathy 2) Splenic lesions R/o Lymphoma 3) Rt kidney mass ? [facility] PLAN: ***   FOLLOW UP: ***  The total time spent in the appt was *** minutes and more than 50% was on counseling and direct patient cares.  All of the patient's questions were answered with apparent satisfaction. The patient knows to call the clinic with any problems, questions or concerns.   Sullivan Lone MD [place] AAHIVMS [facility] Hematology/Oncology Physician [facility]  (Office):       [phone] (Work cell):  [phone] (Fax):           [phone]  02/13/2020 3:25 AM  I, Yevette Edwards, am acting as a scribe for Dr. Sullivan Lone.   {Add Barista Statement}

## 2020-02-14 ENCOUNTER — Telehealth: Payer: Self-pay | Admitting: *Deleted

## 2020-02-14 NOTE — Telephone Encounter (Signed)
Called to inform patient of appt time on 1/27. Patient verbalized understanding.

## 2020-02-16 ENCOUNTER — Other Ambulatory Visit: Payer: Self-pay | Admitting: Cardiology

## 2020-02-18 ENCOUNTER — Emergency Department (HOSPITAL_COMMUNITY): Payer: Medicare Other

## 2020-02-18 ENCOUNTER — Inpatient Hospital Stay (HOSPITAL_COMMUNITY)
Admission: EM | Admit: 2020-02-18 | Discharge: 2020-02-22 | DRG: 177 | Disposition: A | Discharge status: Home / Self Care | Payer: Medicare Other | Attending: Internal Medicine | Admitting: Internal Medicine

## 2020-02-18 ENCOUNTER — Encounter (HOSPITAL_COMMUNITY): Payer: Self-pay | Admitting: Internal Medicine

## 2020-02-18 DIAGNOSIS — E1152 Type 2 diabetes mellitus with diabetic peripheral angiopathy with gangrene: Secondary | ICD-10-CM | POA: Diagnosis present

## 2020-02-18 DIAGNOSIS — Z888 Allergy status to other drugs, medicaments and biological substances status: Secondary | ICD-10-CM

## 2020-02-18 DIAGNOSIS — E872 Acidosis: Secondary | ICD-10-CM | POA: Diagnosis present

## 2020-02-18 DIAGNOSIS — U071 COVID-19: Secondary | ICD-10-CM | POA: Diagnosis present

## 2020-02-18 DIAGNOSIS — I96 Gangrene, not elsewhere classified: Secondary | ICD-10-CM | POA: Diagnosis present

## 2020-02-18 DIAGNOSIS — Z9115 Patient's noncompliance with renal dialysis: Secondary | ICD-10-CM | POA: Diagnosis not present

## 2020-02-18 DIAGNOSIS — J9601 Acute respiratory failure with hypoxia: Secondary | ICD-10-CM | POA: Diagnosis not present

## 2020-02-18 DIAGNOSIS — I712 Thoracic aortic aneurysm, without rupture: Secondary | ICD-10-CM | POA: Diagnosis present

## 2020-02-18 DIAGNOSIS — I4510 Unspecified right bundle-branch block: Secondary | ICD-10-CM | POA: Diagnosis present

## 2020-02-18 DIAGNOSIS — Z8249 Family history of ischemic heart disease and other diseases of the circulatory system: Secondary | ICD-10-CM

## 2020-02-18 DIAGNOSIS — G8929 Other chronic pain: Secondary | ICD-10-CM | POA: Diagnosis present

## 2020-02-18 DIAGNOSIS — E1122 Type 2 diabetes mellitus with diabetic chronic kidney disease: Secondary | ICD-10-CM | POA: Diagnosis present

## 2020-02-18 DIAGNOSIS — E669 Obesity, unspecified: Secondary | ICD-10-CM | POA: Diagnosis present

## 2020-02-18 DIAGNOSIS — I10 Essential (primary) hypertension: Secondary | ICD-10-CM | POA: Diagnosis not present

## 2020-02-18 DIAGNOSIS — I5033 Acute on chronic diastolic (congestive) heart failure: Secondary | ICD-10-CM | POA: Diagnosis present

## 2020-02-18 DIAGNOSIS — R7989 Other specified abnormal findings of blood chemistry: Secondary | ICD-10-CM | POA: Diagnosis present

## 2020-02-18 DIAGNOSIS — M7989 Other specified soft tissue disorders: Secondary | ICD-10-CM | POA: Diagnosis not present

## 2020-02-18 DIAGNOSIS — D631 Anemia in chronic kidney disease: Secondary | ICD-10-CM | POA: Diagnosis present

## 2020-02-18 DIAGNOSIS — E8779 Other fluid overload: Secondary | ICD-10-CM | POA: Diagnosis present

## 2020-02-18 DIAGNOSIS — I2510 Atherosclerotic heart disease of native coronary artery without angina pectoris: Secondary | ICD-10-CM | POA: Diagnosis present

## 2020-02-18 DIAGNOSIS — R609 Edema, unspecified: Secondary | ICD-10-CM | POA: Diagnosis not present

## 2020-02-18 DIAGNOSIS — Z87891 Personal history of nicotine dependence: Secondary | ICD-10-CM

## 2020-02-18 DIAGNOSIS — E8770 Fluid overload, unspecified: Secondary | ICD-10-CM | POA: Diagnosis not present

## 2020-02-18 DIAGNOSIS — E118 Type 2 diabetes mellitus with unspecified complications: Secondary | ICD-10-CM | POA: Diagnosis not present

## 2020-02-18 DIAGNOSIS — E11319 Type 2 diabetes mellitus with unspecified diabetic retinopathy without macular edema: Secondary | ICD-10-CM | POA: Diagnosis present

## 2020-02-18 DIAGNOSIS — I132 Hypertensive heart and chronic kidney disease with heart failure and with stage 5 chronic kidney disease, or end stage renal disease: Secondary | ICD-10-CM | POA: Diagnosis present

## 2020-02-18 DIAGNOSIS — E1169 Type 2 diabetes mellitus with other specified complication: Secondary | ICD-10-CM | POA: Diagnosis present

## 2020-02-18 DIAGNOSIS — Z794 Long term (current) use of insulin: Secondary | ICD-10-CM

## 2020-02-18 DIAGNOSIS — Z6836 Body mass index (BMI) 36.0-36.9, adult: Secondary | ICD-10-CM | POA: Diagnosis not present

## 2020-02-18 DIAGNOSIS — Z83438 Family history of other disorder of lipoprotein metabolism and other lipidemia: Secondary | ICD-10-CM

## 2020-02-18 DIAGNOSIS — Z992 Dependence on renal dialysis: Secondary | ICD-10-CM | POA: Diagnosis not present

## 2020-02-18 DIAGNOSIS — M159 Polyosteoarthritis, unspecified: Secondary | ICD-10-CM | POA: Diagnosis present

## 2020-02-18 DIAGNOSIS — F419 Anxiety disorder, unspecified: Secondary | ICD-10-CM | POA: Diagnosis present

## 2020-02-18 DIAGNOSIS — Z8616 Personal history of COVID-19: Secondary | ICD-10-CM | POA: Diagnosis not present

## 2020-02-18 DIAGNOSIS — E1165 Type 2 diabetes mellitus with hyperglycemia: Secondary | ICD-10-CM | POA: Diagnosis present

## 2020-02-18 DIAGNOSIS — Z833 Family history of diabetes mellitus: Secondary | ICD-10-CM

## 2020-02-18 DIAGNOSIS — N2581 Secondary hyperparathyroidism of renal origin: Secondary | ICD-10-CM | POA: Diagnosis present

## 2020-02-18 DIAGNOSIS — Z79899 Other long term (current) drug therapy: Secondary | ICD-10-CM

## 2020-02-18 DIAGNOSIS — E1159 Type 2 diabetes mellitus with other circulatory complications: Secondary | ICD-10-CM | POA: Diagnosis present

## 2020-02-18 DIAGNOSIS — J9600 Acute respiratory failure, unspecified whether with hypoxia or hypercapnia: Principal | ICD-10-CM

## 2020-02-18 DIAGNOSIS — E785 Hyperlipidemia, unspecified: Secondary | ICD-10-CM | POA: Diagnosis not present

## 2020-02-18 DIAGNOSIS — N186 End stage renal disease: Secondary | ICD-10-CM | POA: Diagnosis not present

## 2020-02-18 DIAGNOSIS — E875 Hyperkalemia: Secondary | ICD-10-CM | POA: Diagnosis present

## 2020-02-18 DIAGNOSIS — Z823 Family history of stroke: Secondary | ICD-10-CM

## 2020-02-18 LAB — RESP PANEL BY RT-PCR (FLU A&B, COVID) ARPGX2
Influenza A by PCR: NEGATIVE
Influenza B by PCR: NEGATIVE
SARS Coronavirus 2 by RT PCR: POSITIVE — AB

## 2020-02-18 LAB — FERRITIN: Ferritin: 1315 ng/mL — ABNORMAL HIGH (ref 11–307)

## 2020-02-18 LAB — BASIC METABOLIC PANEL
Anion gap: 19 — ABNORMAL HIGH (ref 5–15)
BUN: 87 mg/dL — ABNORMAL HIGH (ref 6–20)
CO2: 23 mmol/L (ref 22–32)
Calcium: 9.7 mg/dL (ref 8.9–10.3)
Chloride: 100 mmol/L (ref 98–111)
Creatinine, Ser: 9.31 mg/dL — ABNORMAL HIGH (ref 0.44–1.00)
GFR, Estimated: 5 mL/min — ABNORMAL LOW (ref >60)
Glucose, Bld: 155 mg/dL — ABNORMAL HIGH (ref 70–99)
Potassium: 6.3 mmol/L (ref 3.5–5.1)
Sodium: 142 mmol/L (ref 135–145)

## 2020-02-18 LAB — CBC
HCT: 30.5 % — ABNORMAL LOW (ref 36.0–46.0)
Hemoglobin: 9.3 g/dL — ABNORMAL LOW (ref 12.0–15.0)
MCH: 32 pg (ref 26.0–34.0)
MCHC: 30.5 g/dL (ref 30.0–36.0)
MCV: 104.8 fL — ABNORMAL HIGH (ref 80.0–100.0)
Platelets: 180 10*3/uL (ref 150–400)
RBC: 2.91 MIL/uL — ABNORMAL LOW (ref 3.87–5.11)
RDW: 13.9 % (ref 11.5–15.5)
WBC: 8.5 10*3/uL (ref 4.0–10.5)
nRBC: 0 % (ref 0.0–0.2)

## 2020-02-18 LAB — POC SARS CORONAVIRUS 2 AG -  ED: SARS Coronavirus 2 Ag: NEGATIVE

## 2020-02-18 LAB — I-STAT ARTERIAL BLOOD GAS, ED
Acid-Base Excess: 4 mmol/L — ABNORMAL HIGH (ref 0.0–2.0)
Bicarbonate: 29.8 mmol/L — ABNORMAL HIGH (ref 20.0–28.0)
Calcium, Ion: 1.23 mmol/L (ref 1.15–1.40)
HCT: 27 % — ABNORMAL LOW (ref 36.0–46.0)
Hemoglobin: 9.2 g/dL — ABNORMAL LOW (ref 12.0–15.0)
O2 Saturation: 89 %
Potassium: 5.8 mmol/L — ABNORMAL HIGH (ref 3.5–5.1)
Sodium: 142 mmol/L (ref 135–145)
TCO2: 31 mmol/L (ref 22–32)
pCO2 arterial: 51.9 mmHg — ABNORMAL HIGH (ref 32.0–48.0)
pH, Arterial: 7.367 (ref 7.350–7.450)
pO2, Arterial: 60 mmHg — ABNORMAL LOW (ref 83.0–108.0)

## 2020-02-18 LAB — AMMONIA: Ammonia: 31 umol/L (ref 9–35)

## 2020-02-18 LAB — PROCALCITONIN: Procalcitonin: 0.14 ng/mL

## 2020-02-18 LAB — C-REACTIVE PROTEIN: CRP: 1.2 mg/dL — ABNORMAL HIGH (ref <1.0)

## 2020-02-18 LAB — D-DIMER, QUANTITATIVE: D-Dimer, Quant: 5.15 ug/mL-FEU — ABNORMAL HIGH (ref 0.00–0.50)

## 2020-02-18 LAB — HEMOGLOBIN A1C
Hgb A1c MFr Bld: 8.5 % — ABNORMAL HIGH (ref 4.8–5.6)
Mean Plasma Glucose: 197.25 mg/dL

## 2020-02-18 LAB — HIV ANTIBODY (ROUTINE TESTING W REFLEX): HIV Screen 4th Generation wRfx: NONREACTIVE

## 2020-02-18 LAB — BRAIN NATRIURETIC PEPTIDE: B Natriuretic Peptide: 912.4 pg/mL — ABNORMAL HIGH (ref 0.0–100.0)

## 2020-02-18 LAB — FIBRINOGEN: Fibrinogen: 503 mg/dL — ABNORMAL HIGH (ref 210–475)

## 2020-02-18 LAB — TROPONIN I (HIGH SENSITIVITY): Troponin I (High Sensitivity): 62 ng/L — ABNORMAL HIGH (ref <18)

## 2020-02-18 LAB — LACTATE DEHYDROGENASE: LDH: 181 U/L (ref 98–192)

## 2020-02-18 LAB — HEPATITIS B SURFACE ANTIGEN: Hepatitis B Surface Ag: NONREACTIVE

## 2020-02-18 MED ORDER — SODIUM CHLORIDE 0.9 % IV SOLN
100.0000 mg | Freq: Every day | INTRAVENOUS | Status: AC
  Administered 2020-02-19 – 2020-02-22 (×4): 100 mg via INTRAVENOUS
  Filled 2020-02-18 (×4): qty 20

## 2020-02-18 MED ORDER — OXYCODONE HCL 5 MG PO TABS
10.0000 mg | ORAL_TABLET | Freq: Three times a day (TID) | ORAL | Status: DC | PRN
  Administered 2020-02-19: 10 mg via ORAL
  Filled 2020-02-18: qty 2

## 2020-02-18 MED ORDER — FERRIC CITRATE 1 GM 210 MG(FE) PO TABS: 630.0000 mg | ORAL_TABLET | ORAL | Status: DC

## 2020-02-18 MED ORDER — SODIUM BICARBONATE 8.4 % IV SOLN
50.0000 meq | Freq: Once | INTRAVENOUS | Status: AC
  Administered 2020-02-18: 50 meq via INTRAVENOUS
  Filled 2020-02-18: qty 50

## 2020-02-18 MED ORDER — CALCIUM GLUCONATE 10 % IV SOLN
1.0000 g | Freq: Once | INTRAVENOUS | Status: AC
  Administered 2020-02-18: 1 g via INTRAVENOUS
  Filled 2020-02-18: qty 10

## 2020-02-18 MED ORDER — SODIUM ZIRCONIUM CYCLOSILICATE 10 G PO PACK
10.0000 g | PACK | Freq: Three times a day (TID) | ORAL | Status: AC
  Administered 2020-02-18: 10 g via ORAL
  Filled 2020-02-18: qty 1

## 2020-02-18 MED ORDER — PREDNISONE 5 MG PO TABS: 50.0000 mg | ORAL_TABLET | Freq: Every day | ORAL | Status: DC

## 2020-02-18 MED ORDER — SODIUM CHLORIDE 0.9 % IV SOLN
200.0000 mg | Freq: Once | INTRAVENOUS | Status: AC
  Administered 2020-02-18: 200 mg via INTRAVENOUS
  Filled 2020-02-18: qty 40

## 2020-02-18 MED ORDER — CALCITRIOL 0.25 MCG PO CAPS
0.5000 ug | ORAL_CAPSULE | ORAL | Status: DC
  Administered 2020-02-20: 0.5 ug via ORAL
  Filled 2020-02-18: qty 2

## 2020-02-18 MED ORDER — FERRIC CITRATE 1 GM 210 MG(FE) PO TABS
1050.0000 mg | ORAL_TABLET | Freq: Three times a day (TID) | ORAL | Status: DC
  Administered 2020-02-19 – 2020-02-22 (×10): 1050 mg via ORAL
  Filled 2020-02-18 (×11): qty 5

## 2020-02-18 MED ORDER — ALBUTEROL SULFATE HFA 108 (90 BASE) MCG/ACT IN AERS
2.0000 | INHALATION_SPRAY | Freq: Four times a day (QID) | RESPIRATORY_TRACT | Status: DC
  Administered 2020-02-19 – 2020-02-22 (×13): 2 via RESPIRATORY_TRACT
  Filled 2020-02-18: qty 6.7

## 2020-02-18 MED ORDER — FERRIC CITRATE 1 GM 210 MG(FE) PO TABS
630.0000 mg | ORAL_TABLET | ORAL | Status: DC
Start: 2020-02-18 — End: 2020-02-22
  Administered 2020-02-18 – 2020-02-21 (×5): 630 mg via ORAL
  Filled 2020-02-18 (×5): qty 3

## 2020-02-18 MED ORDER — FAMOTIDINE 20 MG PO TABS
10.0000 mg | ORAL_TABLET | Freq: Every day | ORAL | Status: DC
  Administered 2020-02-19 – 2020-02-22 (×4): 10 mg via ORAL
  Filled 2020-02-18 (×4): qty 1

## 2020-02-18 MED ORDER — INSULIN ASPART PROT & ASPART (70-30 MIX) 100 UNIT/ML ~~LOC~~ SUSP
30.0000 [IU] | Freq: Two times a day (BID) | SUBCUTANEOUS | Status: DC
  Administered 2020-02-19 – 2020-02-22 (×7): 30 [IU] via SUBCUTANEOUS
  Filled 2020-02-18: qty 10

## 2020-02-18 MED ORDER — HYDROXYZINE HCL 25 MG PO TABS
25.0000 mg | ORAL_TABLET | Freq: Every day | ORAL | Status: DC
  Administered 2020-02-19 – 2020-02-22 (×4): 25 mg via ORAL
  Filled 2020-02-18 (×4): qty 1

## 2020-02-18 MED ORDER — HYDRALAZINE HCL 20 MG/ML IJ SOLN
10.0000 mg | INTRAMUSCULAR | Status: DC | PRN
  Administered 2020-02-20: 10 mg via INTRAVENOUS
  Filled 2020-02-18: qty 1

## 2020-02-18 MED ORDER — PRAMIPEXOLE DIHYDROCHLORIDE 0.25 MG PO TABS
0.5000 mg | ORAL_TABLET | Freq: Every day | ORAL | Status: DC
  Administered 2020-02-19 – 2020-02-22 (×4): 0.5 mg via ORAL
  Filled 2020-02-18 (×5): qty 2

## 2020-02-18 MED ORDER — METOPROLOL SUCCINATE ER 100 MG PO TB24
100.0000 mg | ORAL_TABLET | Freq: Every day | ORAL | Status: DC
  Administered 2020-02-19 – 2020-02-22 (×4): 100 mg via ORAL
  Filled 2020-02-18 (×4): qty 1

## 2020-02-18 MED ORDER — INSULIN ASPART 100 UNIT/ML ~~LOC~~ SOLN
0.0000 [IU] | Freq: Three times a day (TID) | SUBCUTANEOUS | Status: DC
  Administered 2020-02-19: 3 [IU] via SUBCUTANEOUS
  Administered 2020-02-19: 6 [IU] via SUBCUTANEOUS
  Administered 2020-02-20: 5 [IU] via SUBCUTANEOUS

## 2020-02-18 MED ORDER — HYDROCOD POLST-CPM POLST ER 10-8 MG/5ML PO SUER
5.0000 mL | Freq: Two times a day (BID) | ORAL | Status: DC | PRN
  Administered 2020-02-20: 5 mL via ORAL
  Filled 2020-02-18: qty 5

## 2020-02-18 MED ORDER — HEPARIN SODIUM (PORCINE) 5000 UNIT/ML IJ SOLN
5000.0000 [IU] | Freq: Three times a day (TID) | INTRAMUSCULAR | Status: DC
  Administered 2020-02-18 – 2020-02-19 (×2): 5000 [IU] via SUBCUTANEOUS
  Filled 2020-02-18 (×2): qty 1

## 2020-02-18 MED ORDER — GUAIFENESIN-DM 100-10 MG/5ML PO SYRP
10.0000 mL | ORAL_SOLUTION | ORAL | Status: DC | PRN
  Administered 2020-02-20: 10 mL via ORAL
  Filled 2020-02-18: qty 10

## 2020-02-18 MED ORDER — RENA-VITE PO TABS
1.0000 | ORAL_TABLET | Freq: Every day | ORAL | Status: DC
  Administered 2020-02-18 – 2020-02-21 (×4): 1 via ORAL
  Filled 2020-02-18 (×4): qty 1

## 2020-02-18 MED ORDER — ROSUVASTATIN CALCIUM 20 MG PO TABS
40.0000 mg | ORAL_TABLET | Freq: Every day | ORAL | Status: DC
Start: 2020-02-18 — End: 2020-02-19

## 2020-02-18 MED ORDER — METHYLPREDNISOLONE SODIUM SUCC 125 MG IJ SOLR
50.0000 mg | Freq: Two times a day (BID) | INTRAMUSCULAR | Status: DC
  Administered 2020-02-19 – 2020-02-20 (×3): 50 mg via INTRAVENOUS
  Filled 2020-02-18 (×3): qty 2

## 2020-02-18 MED ORDER — ZINC SULFATE 220 (50 ZN) MG PO CAPS
220.0000 mg | ORAL_CAPSULE | Freq: Every day | ORAL | Status: DC
  Administered 2020-02-19 – 2020-02-22 (×4): 220 mg via ORAL
  Filled 2020-02-18 (×4): qty 1

## 2020-02-18 MED ORDER — ASCORBIC ACID 500 MG PO TABS
500.0000 mg | ORAL_TABLET | Freq: Every day | ORAL | Status: DC
  Administered 2020-02-19 – 2020-02-22 (×4): 500 mg via ORAL
  Filled 2020-02-18 (×4): qty 1

## 2020-02-18 MED ORDER — ONDANSETRON HCL 4 MG PO TABS: 4.0000 mg | ORAL_TABLET | Freq: Four times a day (QID) | ORAL | Status: DC | PRN

## 2020-02-18 MED ORDER — CHLORHEXIDINE GLUCONATE CLOTH 2 % EX PADS
6.0000 | MEDICATED_PAD | Freq: Every day | CUTANEOUS | Status: DC
  Administered 2020-02-20 – 2020-02-22 (×3): 6 via TOPICAL

## 2020-02-18 MED ORDER — ONDANSETRON HCL 4 MG/2ML IJ SOLN: 4.0000 mg | Freq: Four times a day (QID) | INTRAMUSCULAR | Status: DC | PRN

## 2020-02-18 MED ORDER — HEPARIN SODIUM (PORCINE) 1000 UNIT/ML IJ SOLN
INTRAMUSCULAR | Status: AC
  Filled 2020-02-18: qty 8

## 2020-02-18 NOTE — Progress Notes (Signed)
Renal Navigator notes patient's admission and positive COVID test. Navigator notified patient's HD clinic/[facility]. Emilie Rutter isolation shift is MWF 3rd shift 5:00pm. Navigator will follow closely.  Alphonzo Cruise, [place] Renal Navigator [phone]

## 2020-02-18 NOTE — H&P (Addendum)
History and Physical    [name] GHW:[id number] DOB: [birth year] DOA: 02/18/2020  Referring MD/NP/PA: Margarita Mail, PA-C PCP: Sandi Mariscal, MD  Patient coming from: home via EMS  Chief Complaint: Shortness of breath  I have personally briefly reviewed patient's old medical records in [place]   HPI: [name] is a 55 y.o. female with medical history significant of ESRD on HD, hypertension, hyperlipidemia, diabetes mellitus type 2, COVID-19 in 10/2018, and nonobstructive CAD presents with complaints of worsening shortness of breath.  Patient's last hemodialysis session was on 11/14 and she reports she is due to go to dialysis today.  She is unable to get out due to the road conditions.  Overnight patient was unable to rest due to feeling as though she was other whenever she tried to lay down.  Associated symptoms of leg swelling and has a productive cough for about 1 week.  She believes she was exposed to COVID-19 from someone in her house.  She reports being fully vaccinated including her booster.  ED Course: On admission to the emergency department patient was seen to be afebrile with blood pressures elevated up to 174/77, and O2 saturations initially as low as 87% which patient was initially placed on nonrebreather mask.  Labs were significant for hemoglobin 9.3, potassium 6.3, CO2 23, BUN 87, creatinine 9.31, BNP 912.4, and troponin 62.  COVID-19 screening was positive.  Nephrology has been formally consulted.  Patient has been ordered 1 amp of sodium bicarb, 1 g of calcium gluconate, Lokelma 10 g every 8 hours x 2 doses.  Patient was able to be weaned down to 3 L of nasal cannula oxygen.  Review of Systems  Constitutional: Positive for malaise/fatigue. Negative for fever.  HENT: Negative for ear discharge and nosebleeds.   Eyes: Negative for photophobia and pain.  Respiratory: Positive for cough and shortness of breath.   Cardiovascular: Positive for orthopnea and leg  swelling.  Gastrointestinal: Negative for abdominal pain, diarrhea, nausea and vomiting.  Genitourinary: Negative for dysuria.  Musculoskeletal: Positive for myalgias. Negative for falls.  Skin: Negative for rash.  Neurological: Negative for focal weakness and loss of consciousness.  Psychiatric/Behavioral: Negative for hallucinations. The patient has insomnia.     Past Medical History:  Diagnosis Date  . Anemia   . Anxiety   . Arthritis    "knees" "hands", "RA"  . CAD (coronary artery disease)    Nonobstructive on CT 2019  . CHF (congestive heart failure) ([place])   . COVID-19 10/2018  . Dyspnea    "when I have too much fluid."  . ESRD (end stage renal disease) ([place])    Dialysis [facility]- 3rd st  . Headache(784.0)   . Heart murmur   . High cholesterol   . History of blood transfusion   . Hypertension   . Mitral regurgitation    moderate to severe MR 10/2018 echo  . 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 Pericardial effusion   . Pneumonia    ; 11/16/2018- "touch of pneumonia" - seen in ED- 11/14/2018- on antibiotic. Has had it x 2  . PVD (peripheral vascular disease) ([place])    Right leg stent in [place].  (No records)  . Restless legs   . Sleep apnea    does not use Cpap  . Thoracic ascending aortic aneurysm (HCC)    4.4 cm 11/14/18 CTA  . Type II diabetes mellitus ([place])  Past Surgical History:  Procedure Laterality Date  . AMPUTATION Left 12/27/2018   Procedure: LEFT THUMB REVISION AMPUTATION DIGIT;  Surgeon: Charlotte Crumb, MD;  Location: [facility];  Service: Orthopedics;  Laterality: Left;  . AV FISTULA PLACEMENT Left   . AV FISTULA PLACEMENT Right 03/12/2019   Procedure: INSERTION OF ARTERIOVENOUS (AV) GORE-TEX GRAFT ARM;  Surgeon: Elam Dutch, MD;  Location: [facility] OR;  Service: Vascular;  Laterality: Right;  . AV FISTULA PLACEMENT Right 08/13/2019   Procedure: RIGHT UPPER ARM ARTERIOVENOUS (AV) FISTULA CREATION WITH BASILIC VEIN;   Surgeon: Elam Dutch, MD;  Location: [facility];  Service: Vascular;  Laterality: Right;  . [place]; 1997   x 2  . COLONOSCOPY    . EYE SURGERY Bilateral    cataract surgery  . INSERTION OF DIALYSIS CATHETER Right 09/26/2018   Procedure: INSERTION OF DIALYSIS CATHETER Right Internal Jugular.;  Surgeon: Elam Dutch, MD;  Location: [facility];  Service: Vascular;  Laterality: Right;  . IR FLUORO GUIDE CV LINE RIGHT  05/04/2019  . IR US GUIDE VASC ACCESS RIGHT  05/04/2019  . LIGATION OF ARTERIOVENOUS  FISTULA Left 09/26/2018   Procedure: LIGATION OF ARTERIOVENOUS  FISTULA LEFT ARM;  Surgeon: Elam Dutch, MD;  Location: [place];  Service: Vascular;  Laterality: Left;  . PERICARDIAL WINDOW  02/2004   for pericardial effusion  . PERIPHERAL VASCULAR INTERVENTION Right 10/26/2019   Procedure: PERIPHERAL VASCULAR INTERVENTION;  Surgeon: Elam Dutch, MD;  Location: [facility];  Service: Cardiovascular;  Laterality: Right;  arm  AV fistula  . TUBAL LIGATION  05/1995  . UPPER EXTREMITY VENOGRAPHY N/A 06/22/2019   Procedure: UPPER EXTREMITY VENOGRAPHY - Right Central;  Surgeon: Elam Dutch, MD;  Location: [facility];  Service: Cardiovascular;  Laterality: N/A;     reports that she quit smoking about 7 months ago. Her smoking use included cigarettes. She quit after 33.00 years of use. She has never used smokeless tobacco. She reports that she does not drink alcohol and does not use drugs.  Allergies  Allergen Reactions  . Bupropion Itching    Family History  Problem Relation Age of Onset  . Cancer Brother   . Heart disease Father        Died age 23  . Diabetes Father   . Hyperlipidemia Father   . Hypertension Father   . Stroke Father   . Diabetes Mother   . Hypertension Mother   . Stroke Mother   . Dementia Mother   . Diabetes Sister   . Diabetes Brother   . Hypertension Sister   . Hypertension Brother     Prior to Admission medications    Medication Sig Start Date End Date Taking? Authorizing Provider  amLODipine (NORVASC) 2.5 MG tablet SMARTSIG:1 Tablet(s) By Mouth Every Evening 11/01/19   [provider]  AURYXIA 1 GM 210 MG(Fe) tablet Take 630-1,050 mg by mouth See admin instructions. Take 5 tablets (1050 mg) by mouth with each meal & take 3 tablets (630 mg) by mouth with each snack 05/29/18   [provider]  b complex-vitamin c-folic acid (NEPHRO-VITE) 0.8 MG TABS tablet Take 1 tablet by mouth Every Tuesday,Thursday,and Saturday with dialysis.  04/12/19   [provider]  blood glucose meter kit and supplies KIT Dispense based on patient and insurance preference. Use up to four times daily as directed. (FOR ICD-9 250.00, 250.01). 10/25/18   Hongalgi, Lenis Dickinson, MD  [facility]  PEN 40 MG/0.4ML PNKT SMARTSIG:40 Milligram(s) SUB-Q Every 2 Weeks 10/10/19   [provider]  hydrOXYzine (ATARAX/VISTARIL) 25 MG tablet Take 25 mg by mouth daily.    [provider]  Insulin Lispro Prot & Lispro (HUMALOG MIX 75/25 KWIKPEN) (75-25) 100 UNIT/ML Kwikpen Inject 30 Units into the skin 2 (two) times daily with a meal.     [provider]  Methoxy PEG-Epoetin Beta (MIRCERA IJ) Mircera 09/01/19   [provider]  metoprolol succinate (TOPROL-XL) 100 MG 24 hr tablet Take 100 mg by mouth daily. Take with or immediately following a meal.     [provider]  NARCAN 4 MG/0.1ML LIQD nasal spray kit Place 1 spray into the nose as needed (accidental overdose.).  10/10/19   [provider]  Nutritional Supplements (VITAMIN D BOOSTER PO) Take by mouth. 11/13/19 11/11/20  [provider]  oxyCODONE (OXY IR/ROXICODONE) 5 MG immediate release tablet Take 10 mg by mouth 3 (three) times daily as needed (pain.).  09/24/19   [provider]  pramipexole (MIRAPEX) 0.5 MG tablet Take 0.5 mg by mouth daily.  05/18/17   [provider]  rosuvastatin (CRESTOR) 40 MG tablet TAKE 1  TABLET BY MOUTH EVERY DAY *PATIENT NEEDS APPOINTMENT FOR FURTHER REFILLS* 02/18/20   Minus Breeding, MD    Physical Exam:  Constitutional: Middle-aged female who appears to be in respiratory discomfort Vitals:   02/18/20 1430 02/18/20 1445 02/18/20 1500 02/18/20 1538  BP: (!) 146/115 126/66  101/83  Pulse:  69 69 71  Resp:   15 15  Temp:      TempSrc:      SpO2:  95% 96% 100%   Eyes: PERRL, lids and conjunctivae normal ENMT: Mucous membranes are dry Posterior pharynx clear of any exudate or lesions.  Neck: normal, supple, no masses, no thyromegaly Respiratory: Crackles appreciated in both lung fields.  Patient currently on 3 L of oxygen with O2 saturations currently 98%. Cardiovascular: Regular rate and rhythm, no murmurs / rubs / gallops.  +1 lower extremity edema. 2+ pedal pulses. No carotid bruits.  Right upper chest wall hemodialysis catheter. Abdomen: no tenderness, no masses palpated. No hepatosplenomegaly. Bowel sounds positive.  Musculoskeletal: Clubbing present.  Amputation of the left thumb. Skin: no rashes, lesions, ulcers. No induration Neurologic: CN 2-12 grossly intact. Sensation intact, DTR normal. Strength 5/5 in all 4.  Psychiatric: Normal judgment and insight. Alert and oriented x 3. Normal mood.     Labs on Admission: I have personally reviewed following labs and imaging studies  CBC: Recent Labs  Lab 02/18/20 1237 02/18/20 1317  WBC  --  8.5  HGB 9.2* 9.3*  HCT 27.0* 30.5*  MCV  --  104.8*  PLT  --  295   Basic Metabolic Panel: Recent Labs  Lab 02/18/20 1237 02/18/20 1317  NA 142 142  K 5.8* 6.3*  CL  --  100  CO2  --  23  GLUCOSE  --  155*  BUN  --  87*  CREATININE  --  9.31*  CALCIUM  --  9.7   GFR: CrCl cannot be calculated (Unknown ideal weight.). Liver Function Tests: No results for input(s): AST, ALT, ALKPHOS, BILITOT, PROT, ALBUMIN in the last 168 hours. No results for input(s): LIPASE, AMYLASE in the last 168 hours. No results  for input(s): AMMONIA in the last 168 hours. Coagulation Profile: No results for input(s): INR, PROTIME in the last 168 hours. Cardiac Enzymes: No results for input(s): CKTOTAL,  CKMB, CKMBINDEX, TROPONINI in the last 168 hours. BNP (last 3 results) No results for input(s): PROBNP in the last 8760 hours. HbA1C: No results for input(s): HGBA1C in the last 72 hours. CBG: No results for input(s): GLUCAP in the last 168 hours. Lipid Profile: No results for input(s): CHOL, HDL, LDLCALC, TRIG, CHOLHDL, LDLDIRECT in the last 72 hours. Thyroid Function Tests: No results for input(s): TSH, T4TOTAL, FREET4, T3FREE, THYROIDAB in the last 72 hours. Anemia Panel: No results for input(s): VITAMINB12, FOLATE, FERRITIN, TIBC, IRON, RETICCTPCT in the last 72 hours. Urine analysis:    Component Value Date/Time   COLORURINE YELLOW 07/22/2011 1526   APPEARANCEUR CLOUDY (A) 07/22/2011 1526   LABSPEC 1.022 07/22/2011 1526   PHURINE 5.5 07/22/2011 1526   GLUCOSEU 100 (A) 07/22/2011 1526   HGBUR MODERATE (A) 07/22/2011 1526   BILIRUBINUR NEGATIVE 07/22/2011 1526   BILIRUBINUR NEG 07/19/2011 1202   KETONESUR NEGATIVE 07/22/2011 1526   PROTEINUR >300 (A) 07/22/2011 1526   UROBILINOGEN 0.2 07/22/2011 1526   NITRITE NEGATIVE 07/22/2011 1526   LEUKOCYTESUR TRACE (A) 07/22/2011 1526   Sepsis Labs: Recent Results (from the past 240 hour(s))  Resp Panel by RT-PCR (Flu A&B, Covid) Nasopharyngeal Swab     Status: Abnormal   Collection Time: 02/18/20 12:48 PM   Specimen: Nasopharyngeal Swab; Nasopharyngeal(NP) swabs in vial transport medium  Result Value Ref Range Status   SARS Coronavirus 2 by RT PCR POSITIVE (A) NEGATIVE Final    Comment: emailed L. Berdik RN 14:40 02/18/20 ([email or web address]) (NOTE) SARS-CoV-2 target nucleic acids are DETECTED.  The SARS-CoV-2 RNA is generally detectable in upper respiratory specimens during the acute phase of infection. Positive results are indicative of the presence of the  identified virus, but do not rule out bacterial infection or co-infection with other pathogens not detected by the test. Clinical correlation with patient history and other diagnostic information is necessary to determine patient infection status. The expected result is Negative.  Fact Sheet for Patients: EntrepreneurPulse.com.au  Fact Sheet for Healthcare Providers: IncredibleEmployment.be  This test is not yet approved or cleared by the [place] FDA and  has been authorized for detection and/or diagnosis of SARS-CoV-2 by FDA under an Emergency Use Authorization (EUA).  This EUA will remain in effect (meaning this test can be used) for the duration of  the COVID-1 9 declaration under Section 564(b)(1) of the Act, 21 U.S.C. section 360bbb-3(b)(1), unless the authorization is terminated or revoked sooner.     Influenza A by PCR NEGATIVE NEGATIVE Final   Influenza B by PCR NEGATIVE NEGATIVE Final    Comment: (NOTE) The Xpert Xpress SARS-CoV-2/FLU/RSV plus assay is intended as an aid in the diagnosis of influenza from Nasopharyngeal swab specimens and should not be used as a sole basis for treatment. Nasal washings and aspirates are unacceptable for Xpert Xpress SARS-CoV-2/FLU/RSV testing.  Fact Sheet for Patients: EntrepreneurPulse.com.au  Fact Sheet for Healthcare Providers: IncredibleEmployment.be  This test is not yet approved or cleared by the [place] FDA and has been authorized for detection and/or diagnosis of SARS-CoV-2 by FDA under an Emergency Use Authorization (EUA). This EUA will remain in effect (meaning this test can be used) for the duration of the COVID-19 declaration under Section 564(b)(1) of the Act, 21 U.S.C. section 360bbb-3(b)(1), unless the authorization is terminated or revoked.  Performed at [facility], [place] [address], [place], [place] [address]       Radiological Exams on Admission: DG Chest Portable 1 View  Result Date:  02/18/2020 CLINICAL DATA:  Shortness of breath.  History of urologic carcinoma EXAM: PORTABLE CHEST 1 VIEW COMPARISON:  November 14, 2018 chest radiograph; PET-CT August 15, 2019. FINDINGS: Central catheter tip is in the right atrium. No pneumothorax. There is cardiomegaly with pulmonary venous hypertension. Small left pleural effusion noted. There is diffuse interstitial thickening, likely indicative of pulmonary edema. No consolidation. There is a nodular lesion in the left mid lung measuring 1.2 x 1.1 cm. There is a questionable nodular area overlying the inferior right hilum measuring 2.3 x 2.1 cm. No appreciable adenopathy by radiography.  No bone lesions. IMPRESSION: 1. Well-defined nodular opacity in left perihilar region measuring 1.2 x 1.1 cm. Questionable less well-defined nodular opacity right lower lung region medially measuring 2.3 x 2.1 cm. These findings raise concern for metastatic foci. Correlation with chest CT, ideally with intravenous contrast, advised to further evaluate. 2. Cardiomegaly with pulmonary vascular congestion. Diffuse interstitial prominence likely represents interstitial pulmonary edema. Suspect a degree of congestive heart failure. Note that lymphangitic spread of tumor potentially could present in this manner. CT may be helpful for further delineation in this regard. 3.  Central catheter tip in right atrium.  No pneumothorax. Electronically Signed   By: Lowella Grip III M.D.   On: 02/18/2020 12:10    EKG: Independently reviewed.  Sinus rhythm at 72 bpm with first-degree heart block and RBBB  Assessment/Plan Acute respiratory failure with hypoxia secondary to fluid overload ESRD on HD: Patient presents with acute shortness of breath.  Found to be hypoxic down to 87% on room air.  Chest x-ray was concerning for pulmonary edema.  Labs were significant for BNP of 912.4.  She normally dialyzes  Monday, Wednesday, and Friday.  She was due for hemodialysis today and last dialyzed on 1/14 -Admit to progressive -Continuous pulse oximetry with nasal cannula oxygen to maintain O2 saturation greater than 90% -Continue current medication regimen  -Appreciate nephrology consultative services, HD per nephrology  COVID-19 infection: Acute.  Incidentally noted to be positive for COVID-19 on admission.  Chest x-ray noted to opacities of concern for which CT scan of the chest was recommended for further evaluation.  Patient had previous been diagnosed with COVID-19 in 10/2018. -Airborne precaution -Remdesivir per pharmacy -Solu-Medrol IV -Check inflammatory markers and monitor daily -Check CT scan of the chest with contrast once patient able to lay flat  Hyperkalemia: Acute initial potassium noted to be elevated at 6.3.  Patient has been given sodium bicarb, calcium gluconate, and Lokelma. -Per HD  Elevated troponin: Acute.  Troponin elevated 62.  EKG without significant ischemic changes.  Suspect secondary to demand. -Continue to trend   Metabolic acidosis with elevated anion gap: Initial anion gap 19.  -Continue to monitor  Anemia of chronic kidney disease: Hemoglobin 9.3 which appears near patient's baseline. -Check CBC in a.m.  Essential hypertension: On admission patient blood pressure was elevated up to 174/77.  She had not taken her home blood pressure medications which include metoprolol 100 mg daily. -Continue metoprolol  -Hydralazine IV as needed for systolic blood pressure greater than 180  Chronic pain -Continue home regimen of oxycodone IR 10 mg as needed pain  Diabetes mellitus type 2: Last hemoglobin A1c on file was noted to be 8.7 on 09/19/2018.  Home regimen includes NovoLog 70/30 mix 30 units twice daily. -Hypoglycemic protocol -Check hemoglobin A1c -Continue home 70/30 mix regimen -CBGs with very sensitive SSI  Hyperlipidemia -Continue Crestor 40 mg daily   DVT  prophylaxis: heparin  Code Status: full Family Communication: Family member updated over the phone Disposition Plan: TBD  Consults called: Nephrology  Admission status: Inpatient, likely require more than 2 midnight stay due to fluid overload in the setting of COVID-19 infection  Norval Morton MD [facility]   If 7PM-7AM, please contact night-coverage   02/18/2020, 3:45 PM

## 2020-02-18 NOTE — ED Notes (Signed)
Patient in dialysis. Not available for vitals at this time.

## 2020-02-18 NOTE — ED Triage Notes (Signed)
Patient arrived from home via [facility], stated she was very SOB. Patient is a dialysis patient and last dialysis on Friday and was unable to get to dialysis today due to the weather. Upon EMS arrival patient c/o chest pain 4/10 substernal that started today. Patient stated she been SOB for 1 week. Patient was given NTG 0.4mg  and Aspirin 324mg  and the pain was subsides a little. When EMS arrived patient SpO2 87% and they placed patient on 15L NRB and SpO2 100%.  Patient son had COVID around 1/1 however she tested negative. EMS 232/85, 99.1, 20, CBG-191.

## 2020-02-18 NOTE — ED Provider Notes (Signed)
[facility] EMERGENCY DEPARTMENT Provider Note   CSN: [id number] Arrival date & time: 02/18/20  1132     History Chief Complaint  Patient presents with  . Shortness of Breath    [name] is a 55 y.o. female who has a pmh of diabetes, thoracic aortic aneurysm, peripheral vascular disease, CHF, hypertension, and end-stage renal disease on hemodialysis Tuesday Thursday and Saturday. There is a level 5 caveat due to respiratory status. Patient was reportedly supposed to go to dialysis today. She was unable to get there apparently due to weather condition. She reports she has had a bad cough since last Monday and was exposed to [place] in the home. She does think they were able to pull off her full amount of fluid at dialysis last session. She does think that she is having trouble breathing due to fluid and her cold. According to EMS patient's oxygen saturations were 87% on room air. She is placed on 15 L nonrebreather. She is currently on 4 L via nasal cannula. HPI     Past Medical History:  Diagnosis Date  . Anemia   . Anxiety   . Arthritis    "knees" "hands", "RA"  . CAD (coronary artery disease)    Nonobstructive on CT 2019  . CHF (congestive heart failure) ([place])   . COVID-19 10/2018  . Dyspnea    "when I have too much fluid."  . ESRD (end stage renal disease) ([place])    Dialysis TTHSat- 3rd st  . Headache(784.0)   . Heart murmur   . High cholesterol   . History of blood transfusion   . Hypertension   . Mitral regurgitation    moderate to severe MR 10/2018 echo  . 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 Pericardial effusion   . Pneumonia    ; 11/16/2018- "touch of pneumonia" - seen in ED- 11/14/2018- on antibiotic. Has had it x 2  . PVD (peripheral vascular disease) ([place])    Right leg stent in [place].  (No records)  . Restless legs   . Sleep apnea    does not use Cpap  . Thoracic ascending aortic aneurysm (HCC)    4.4  cm 11/14/18 CTA  . Type II diabetes mellitus [facility])     Patient Active Problem List   Diagnosis Date Noted  . Pain, unspecified 03/20/2019  . Preop cardiovascular exam 12/19/2018  . Dyslipidemia 11/20/2018  . Shortness of breath 10/23/2018  . Acute respiratory distress 10/23/2018  . Chronic bilateral pleural effusions 10/23/2018  . Obesity 10/23/2018  . Coronary artery disease 10/23/2018  . Tobacco abuse 10/23/2018  . Volume overload 10/23/2018  . Unspecified protein-calorie malnutrition ([place]) 10/11/2018  . Pneumonia 10/10/2018  . COVID-19 virus detected 10/10/2018  . Acute encephalopathy 10/09/2018  . ESRD (end stage renal disease) ([place]) 09/27/2018  . Acute respiratory failure with hypoxia ([place]) 09/26/2018  . Chronic pain disorder 09/26/2018  . Other encephalopathy 09/26/2018  . COVID-19 virus infection 09/18/2018  . Acute metabolic encephalopathy 97/35/3299  . Gangrene ([place]) 09/18/2018  . Allergy, unspecified, initial encounter 09/04/2018  . Anaphylactic shock, unspecified, initial encounter 09/04/2018  . Left arm pain 06/27/2018  . Educated about COVID-19 virus infection 06/27/2018  . Iron deficiency anemia, unspecified 01/10/2018  . Coagulation defect, unspecified ([place]) 12/06/2017  . Diarrhea, unspecified 10/11/2017  . Anemia 09/26/2017  . Left hip pain 09/26/2017  . Neck pain 09/26/2017  . Leukocytosis 09/26/2017  .  Pruritus, unspecified 09/22/2017  . Secondary hyperparathyroidism of renal origin ([place]) 09/01/2017  . Disorder of phosphorus metabolism, unspecified 08/25/2017  . Anemia in chronic kidney disease 08/24/2017  . Chest pain 07/07/2017  . Left ventricular hypertrophy 06/17/2017  . ESRD on dialysis ([place]) 05/21/2011  . Diabetic retinopathy 05/21/2011  . Diastolic dysfunction 42/70/6237  . Hyperlipidemia 01/30/2011  . Type II diabetes mellitus with complication, uncontrolled ([place]) 01/29/2011  . Neuropathy 01/29/2011  . Hypertension     Past Surgical  History:  Procedure Laterality Date  . AMPUTATION Left 12/27/2018   Procedure: LEFT THUMB REVISION AMPUTATION DIGIT;  Surgeon: Charlotte Crumb, MD;  Location: [place] [facility];  Service: Orthopedics;  Laterality: Left;  . AV FISTULA PLACEMENT Left   . AV FISTULA PLACEMENT Right 03/12/2019   Procedure: INSERTION OF ARTERIOVENOUS (AV) GORE-TEX GRAFT ARM;  Surgeon: Elam Dutch, MD;  Location: [facility];  Service: Vascular;  Laterality: Right;  . AV FISTULA PLACEMENT Right 08/13/2019   Procedure: RIGHT UPPER ARM ARTERIOVENOUS (AV) FISTULA CREATION WITH BASILIC VEIN;  Surgeon: Elam Dutch, MD;  Location: [facility];  Service: Vascular;  Laterality: Right;  . [place]; 1997   x 2  . COLONOSCOPY    . EYE SURGERY Bilateral    cataract surgery  . INSERTION OF DIALYSIS CATHETER Right 09/26/2018   Procedure: INSERTION OF DIALYSIS CATHETER Right Internal Jugular.;  Surgeon: Elam Dutch, MD;  Location: [place];  Service: Vascular;  Laterality: Right;  . IR FLUORO GUIDE CV LINE RIGHT  05/04/2019  . IR US GUIDE VASC ACCESS RIGHT  05/04/2019  . LIGATION OF ARTERIOVENOUS  FISTULA Left 09/26/2018   Procedure: LIGATION OF ARTERIOVENOUS  FISTULA LEFT ARM;  Surgeon: Elam Dutch, MD;  Location: [place];  Service: Vascular;  Laterality: Left;  . PERICARDIAL WINDOW  02/2004   for pericardial effusion  . PERIPHERAL VASCULAR INTERVENTION Right 10/26/2019   Procedure: PERIPHERAL VASCULAR INTERVENTION;  Surgeon: Elam Dutch, MD;  Location: [place] CV LAB;  Service: Cardiovascular;  Laterality: Right;  arm  AV fistula  . TUBAL LIGATION  05/1995  . UPPER EXTREMITY VENOGRAPHY N/A 06/22/2019   Procedure: UPPER EXTREMITY VENOGRAPHY - Right Central;  Surgeon: Elam Dutch, MD;  Location: [place] CV LAB;  Service: Cardiovascular;  Laterality: N/A;     OB History   No obstetric history on file.     Family History  Problem Relation Age of Onset  . Cancer Brother   . Heart disease Father         Died age 6  . Diabetes Father   . Hyperlipidemia Father   . Hypertension Father   . Stroke Father   . Diabetes Mother   . Hypertension Mother   . Stroke Mother   . Dementia Mother   . Diabetes Sister   . Diabetes Brother   . Hypertension Sister   . Hypertension Brother     Social History   Tobacco Use  . Smoking status: Former Smoker    Years: 33.00    Types: Cigarettes    Quit date: 07/17/2019    Years since quitting: 0.5  . Smokeless tobacco: Never Used  . Tobacco comment: using NIcotene gum, rare 1/2 cigarette  Vaping Use  . Vaping Use: Never used  Substance Use Topics  . Alcohol use: No  . Drug use: No    Home Medications Prior to Admission medications   Medication Sig Start Date End Date Taking? Authorizing Provider  amLODipine (NORVASC) 2.5 MG tablet SMARTSIG:1 Tablet(s) By Mouth Every Evening 11/01/19   [provider]  AURYXIA 1 GM 210 MG(Fe) tablet Take 630-1,050 mg by mouth See admin instructions. Take 5 tablets (1050 mg) by mouth with each meal & take 3 tablets (630 mg) by mouth with each snack 05/29/18   [provider]  b complex-vitamin c-folic acid (NEPHRO-VITE) 0.8 MG TABS tablet Take 1 tablet by mouth Every Tuesday,Thursday,and Saturday with dialysis.  04/12/19   [provider]  blood glucose meter kit and supplies KIT Dispense based on patient and insurance preference. Use up to four times daily as directed. (FOR ICD-9 250.00, 250.01). 10/25/18   Hongalgi, Lenis Dickinson, MD  HUMIRA PEN 40 MG/0.4ML PNKT SMARTSIG:40 Milligram(s) SUB-Q Every 2 Weeks 10/10/19   [provider]  hydrOXYzine (ATARAX/VISTARIL) 25 MG tablet Take 25 mg by mouth daily.    [provider]  Insulin Lispro Prot & Lispro (HUMALOG MIX 75/25 KWIKPEN) (75-25) 100 UNIT/ML Kwikpen Inject 30 Units into the skin 2 (two) times daily with a meal.     [provider]  Methoxy PEG-Epoetin Beta (MIRCERA IJ) Mircera 09/01/19   [provider]   metoprolol succinate (TOPROL-XL) 100 MG 24 hr tablet Take 100 mg by mouth daily. Take with or immediately following a meal.     [provider]  NARCAN 4 MG/0.1ML LIQD nasal spray kit Place 1 spray into the nose as needed (accidental overdose.).  10/10/19   [provider]  Nutritional Supplements (VITAMIN D BOOSTER PO) Take by mouth. 11/13/19 11/11/20  [provider]  oxyCODONE (OXY IR/ROXICODONE) 5 MG immediate release tablet Take 10 mg by mouth 3 (three) times daily as needed (pain.).  09/24/19   [provider]  pramipexole (MIRAPEX) 0.5 MG tablet Take 0.5 mg by mouth daily.  05/18/17   [provider]  rosuvastatin (CRESTOR) 40 MG tablet TAKE 1 TABLET BY MOUTH EVERY DAY *PATIENT NEEDS APPOINTMENT FOR FURTHER REFILLS* 02/18/20   Minus Breeding, MD    Allergies    Bupropion  Review of Systems   Review of Systems  Ten systems reviewed and are negative for acute change, except as noted in the HPI.    Physical Exam Updated Vital Signs BP 128/68 (BP Location: Right Wrist)   Pulse 78   Temp 98.8 F (37.1 C) (Oral)   Resp 12   LMP  (LMP Unknown) Comment: LMP Feb 2011  SpO2 99%   Physical Exam Vitals and nursing note reviewed.  Constitutional:      Appearance: She is overweight. She is ill-appearing.  HENT:     Head: Normocephalic and atraumatic.     Mouth/Throat:     Mouth: Mucous membranes are dry.  Eyes:     Extraocular Movements: Extraocular movements intact.     Pupils: Pupils are equal, round, and reactive to light.  Cardiovascular:     Rate and Rhythm: Normal rate and regular rhythm.  Pulmonary:     Effort: Tachypnea, accessory muscle usage and prolonged expiration present.     Breath sounds: Decreased air movement present. Wheezing and rhonchi present.  Abdominal:     General: Abdomen is protuberant.     Tenderness: There is no abdominal tenderness.  Musculoskeletal:     Cervical back: Normal range of motion.     Right  lower leg: Edema present.     Left lower leg: Edema present.  Skin:    General: Skin is warm and dry.  Comments: Hot to touch upper body,  Cool extremities  Neurological:     Mental Status: She is lethargic.     GCS: GCS eye subscore is 2. GCS verbal subscore is 4. GCS motor subscore is 6.     ED Results / Procedures / Treatments   Labs (all labs ordered are listed, but only abnormal results are displayed) Labs Reviewed  BASIC METABOLIC PANEL  BRAIN NATRIURETIC PEPTIDE  CBC  BLOOD GAS, ARTERIAL  POC SARS CORONAVIRUS 2 AG -  ED  TROPONIN I (HIGH SENSITIVITY)    EKG EKG Interpretation  Date/Time:  Monday February 18 2020 12:12:09 EST Ventricular Rate:  72 PR Interval:    QRS Duration: 155 QT Interval:  447 QTC Calculation: 490 R Axis:   94 Text Interpretation: Sinus rhythm Borderline prolonged PR interval Right bundle branch block Confirmed by Davonna Belling [phone]) on 02/18/2020 12:15:44 PM   Radiology No results found.  Procedures .Critical Care Performed by: Margarita Mail, PA-C Authorized by: Margarita Mail, PA-C   Critical care provider statement:    Critical care time (minutes):  45   Critical care time was exclusive of:  Separately billable procedures and treating other patients   Critical care was necessary to treat or prevent imminent or life-threatening deterioration of the following conditions:  Respiratory failure, renal failure and metabolic crisis   Critical care was time spent personally by me on the following activities:  Discussions with consultants, evaluation of patient's response to treatment, examination of patient, ordering and performing treatments and interventions, ordering and review of laboratory studies, ordering and review of radiographic studies, pulse oximetry, re-evaluation of patient's condition, obtaining history from patient or surrogate and review of old charts   (including critical care time)  Medications Ordered in  ED Medications - No data to display  ED Course  I have reviewed the triage vital signs and the nursing notes.  Pertinent labs & imaging results that were available during my care of the patient were reviewed by me and considered in my medical decision making (see chart for details).    MDM Rules/Calculators/A&P                          12:21 PM BP 128/68 (BP Location: Right Wrist)   Pulse 78   Temp 98.8 F (37.1 C) (Oral)   Resp 12   LMP  (LMP Unknown) Comment: LMP Feb 2011  SpO2 99%   Patient with  Lethargy, mild respiratory distress using accessory muscles.  She had recent COVID exposure.  Oxygen saturations 87% on room air and she is not generally on oxygen.  Patient could have hypercarbia, aspiration pneumonia, opiate narcosis amongst other causes of her confusion and shortness of breath.     CC:sob VS:  Vitals:   02/19/20 0250 02/19/20 0402 02/19/20 0402 02/19/20 0500  BP: (!) 167/67 (!) 171/75  (!) 127/105  Pulse: 77 81  78  Resp: (!) 23 (!) 31  19  Temp: 98 F (36.7 C)     TempSrc: Oral     SpO2: 100% 95%  100%  Weight:   108.9 kg   Height:   5' 8"  (1.727 m)     OE:UMPNTIR is gathered by patient and EMS. Previous records obtained and reviewed. DDX:The patient's complaint of sob involves an extensive number of diagnostic and treatment options, and is a complaint that carries with it a high risk of complications, morbidity, and potential mortality. Given the  large differential diagnosis, medical decision making is of high complexity. The emergent differential diagnosis for shortness of breath includes, but is not limited to, Pulmonary edema, bronchoconstriction, Pneumonia, Pulmonary embolism, Pneumotherax/ Hemothorax, Dysrythmia, ACS.   Labs: I ordered reviewed and interpreted labs which include CBC which shows no elevated white blood cell count, hemoglobin at 9.3 consistent with baseline BMP shows potassium of 6.3, BUN of 87, creatinine of 9.31 with anion gap  acidosis likely from uremia and need for dialysis.  Patient's BNP lower than previous.  Respiratory panel is positive for COVID Troponin is slightly elevated however patient has persistently elevated troponins  Imaging: I ordered and reviewed images which included 1 view chest x-ray. I independently visualized and interpreted all imaging. Significant findings include multiple nodular opacities turning for metastatic disease of patient's renal carcinoma is also what appears to be pulmonary edema.  EKG: Sinus rhythm at a rate of 72 without changes in T waves due to hyper-K Consults: Case discussed with Dr. Jonnie Finner of nephrology service.  He states that it would be several hours before they are able to dialyze her and asked that we temporize her potassium with calcium gluconate, bicarb and 2 doses of Lokelma every 8 hours. Consider BiPAP if respiratory status worsens. MDM: Patient here with hypoxic respiratory failure.  She is extremely somnolent when she came in however is more alert now and states that she was unable to sleep last night due to her breathing.  She is sitting bolt upright on the side of the bed.  The patient has had a "cold" for the past week with known COVID exposure and has tested positive for COVID.  I suspect her hypoxia is secondary to her pulmonary edema, less likely due to PE, however it is difficult to tell by her chest x-ray if this is due to her COVID infection.  She will need dialysis.  I have discussed the case with the hospitalist service who will admit the patient to the stepdown unit. Patient disposition:The patient appears reasonably stabilized for admission considering the current resources, flow, and capabilities available in the ED at this time, and I doubt any other [facility] requiring further screening and/or treatment in the ED prior to admission.    [name] was evaluated in Emergency Department on 02/19/2020 for the symptoms described in the history of present  illness. She was evaluated in the context of the global COVID-19 pandemic, which necessitated consideration that the patient might be at risk for infection with the SARS-CoV-2 virus that causes COVID-19. Institutional protocols and algorithms that pertain to the evaluation of patients at risk for COVID-19 are in a state of rapid change based on information released by regulatory bodies including the CDC and federal and state organizations. These policies and algorithms were followed during the patient's care in the ED.     Final Clinical Impression(s) / ED Diagnoses Final diagnoses:  None    Rx / DC Orders ED Discharge Orders    None       Margarita Mail, PA-C 02/19/20 [id number]    Davonna Belling, MD 02/19/20 [phone]

## 2020-02-18 NOTE — Consult Note (Signed)
Renal Service Consult Note [facility]  [name] 02/18/2020 Sol Blazing, MD Requesting Physician: Dr Tamala Julian, R.   Reason for Consult: ESRD pt w/  HPI: The patient is a 55 y.o. year-old w/ hx of DM2, PNA, HTN, HL, ESRD on HD, hx COVID pna (2020), CAD presented to ED today w/ SOB. Last HD was Friday 1/14. In ED had some chest pain 4/10 given sl NTG and asa. CP better. SpO2 87% on arrival, placed on NRB masked and doing better. CXR w/ IS edema appearance/ vasc congestion.  BP 150 /70 range. Asked to see for ESRD.    Pt seen in ED, no ^wob, no c/o at this time.  Says can't lie flat, gets SOB.  Sitting up.   ROS  denies CP  no joint pain   no HA  no blurry vision  no rash  no diarrhea  no nausea/ vomiting    Past Medical History  Past Medical History:  Diagnosis Date  . Anemia   . Anxiety   . Arthritis    "knees" "hands", "RA"  . CAD (coronary artery disease)    Nonobstructive on CT 2019  . CHF (congestive heart failure) ([place])   . COVID-19 10/2018  . Dyspnea    "when I have too much fluid."  . ESRD (end stage renal disease) ([place])    Dialysis TTHSat- 3rd st  . Headache(784.0)   . Heart murmur   . High cholesterol   . History of blood transfusion   . Hypertension   . Mitral regurgitation    moderate to severe MR 10/2018 echo  . 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 Pericardial effusion   . Pneumonia    ; 11/16/2018- "touch of pneumonia" - seen in ED- 11/14/2018- on antibiotic. Has had it x 2  . PVD (peripheral vascular disease) ([place])    Right leg stent in [place].  (No records)  . Restless legs   . Sleep apnea    does not use Cpap  . Thoracic ascending aortic aneurysm (HCC)    4.4 cm 11/14/18 CTA  . Type II diabetes mellitus ([place])    Past Surgical History  Past Surgical History:  Procedure Laterality Date  . AMPUTATION Left 12/27/2018   Procedure: LEFT THUMB REVISION AMPUTATION DIGIT;  Surgeon: Charlotte Crumb, MD;  Location: [place];  Service: Orthopedics;  Laterality: Left;  . AV FISTULA PLACEMENT Left   . AV FISTULA PLACEMENT Right 03/12/2019   Procedure: INSERTION OF ARTERIOVENOUS (AV) GORE-TEX GRAFT ARM;  Surgeon: Elam Dutch, MD;  Location: [facility] OR;  Service: Vascular;  Laterality: Right;  . AV FISTULA PLACEMENT Right 08/13/2019   Procedure: RIGHT UPPER ARM ARTERIOVENOUS (AV) FISTULA CREATION WITH BASILIC VEIN;  Surgeon: Elam Dutch, MD;  Location: [place];  Service: Vascular;  Laterality: Right;  . [place]; 1997   x 2  . COLONOSCOPY    . EYE SURGERY Bilateral    cataract surgery  . INSERTION OF DIALYSIS CATHETER Right 09/26/2018   Procedure: INSERTION OF DIALYSIS CATHETER Right Internal Jugular.;  Surgeon: Elam Dutch, MD;  Location: [place];  Service: Vascular;  Laterality: Right;  . IR FLUORO GUIDE CV LINE RIGHT  05/04/2019  . IR US GUIDE VASC ACCESS RIGHT  05/04/2019  . LIGATION OF ARTERIOVENOUS  FISTULA Left 09/26/2018   Procedure: LIGATION OF ARTERIOVENOUS  FISTULA LEFT ARM;  Surgeon: Elam Dutch,  MD;  Location: [place];  Service: Vascular;  Laterality: Left;  . PERICARDIAL WINDOW  02/2004   for pericardial effusion  . PERIPHERAL VASCULAR INTERVENTION Right 10/26/2019   Procedure: PERIPHERAL VASCULAR INTERVENTION;  Surgeon: Elam Dutch, MD;  Location: [place] CV LAB;  Service: Cardiovascular;  Laterality: Right;  arm  AV fistula  . TUBAL LIGATION  05/1995  . UPPER EXTREMITY VENOGRAPHY N/A 06/22/2019   Procedure: UPPER EXTREMITY VENOGRAPHY - Right Central;  Surgeon: Elam Dutch, MD;  Location: [place] CV LAB;  Service: Cardiovascular;  Laterality: N/A;   Family History  Family History  Problem Relation Age of Onset  . Cancer Brother   . Heart disease Father        Died age 58  . Diabetes Father   . Hyperlipidemia Father   . Hypertension Father   . Stroke Father   . Diabetes Mother   . Hypertension Mother   . Stroke Mother   .  Dementia Mother   . Diabetes Sister   . Diabetes Brother   . Hypertension Sister   . Hypertension Brother    Social History  reports that she quit smoking about 7 months ago. Her smoking use included cigarettes. She quit after 33.00 years of use. She has never used smokeless tobacco. She reports that she does not drink alcohol and does not use drugs. Allergies  Allergies  Allergen Reactions  . Bupropion Itching   Home medications Prior to Admission medications   Medication Sig Start Date End Date Taking? Authorizing Provider  AURYXIA 1 GM 210 MG(Fe) tablet Take 630-1,050 mg by mouth See admin instructions. Take 5 tablets (1050 mg) by mouth with each meal & take 3 tablets (630 mg) by mouth with each snack 05/29/18  Yes [provider]  b complex-vitamin c-folic acid (NEPHRO-VITE) 0.8 MG TABS tablet Take 1 tablet by mouth Every Tuesday,Thursday,and Saturday with dialysis.  04/12/19  Yes [provider]  calcitRIOL (ROCALTROL) 0.5 MCG capsule Take 0.5 mcg by mouth as directed. Given at Dialysis   Yes [provider]  HUMIRA PEN 40 MG/0.4ML PNKT Inject 40 mg into the skin every 14 (fourteen) days. 10/10/19  Yes [provider]  hydrOXYzine (ATARAX/VISTARIL) 25 MG tablet Take 25 mg by mouth daily.   Yes [provider]  Insulin Lispro Prot & Lispro (HUMALOG 75/25 MIX) (75-25) 100 UNIT/ML Kwikpen Inject 30 Units into the skin 2 (two) times daily with a meal.    Yes [provider]  Methoxy PEG-Epoetin Beta (MIRCERA IJ) Mircera 09/01/19  Yes [provider]  metoprolol succinate (TOPROL-XL) 100 MG 24 hr tablet Take 100 mg by mouth daily. Take with or immediately following a meal.   Yes [provider]  NARCAN 4 MG/0.1ML LIQD nasal spray kit Place 1 spray into the nose as needed (accidental overdose.).  10/10/19  Yes [provider]  Oxycodone HCl 10 MG TABS Take 10 mg by mouth 3 (three) times daily as needed for pain. 02/08/20   Yes [provider]  pramipexole (MIRAPEX) 0.5 MG tablet Take 0.5 mg by mouth daily.  05/18/17  Yes [provider]  rosuvastatin (CRESTOR) 40 MG tablet TAKE 1 TABLET BY MOUTH EVERY DAY *PATIENT NEEDS APPOINTMENT FOR FURTHER REFILLS* Patient taking differently: Take 40 mg by mouth daily. 02/18/20  Yes Minus Breeding, MD  blood glucose meter kit and supplies KIT Dispense based on patient and insurance preference. Use up to four times daily as directed. (FOR ICD-9  250.00, 250.01). 10/25/18   Vernell Leep D, MD     Vitals:   02/18/20 1430 02/18/20 1445 02/18/20 1500 02/18/20 1538  BP: (!) 146/115 126/66  101/83  Pulse:  69 69 71  Resp:   15 15  Temp:      TempSrc:      SpO2:  95% 96% 100%   Exam Gen awake and alert, sitting up , no distress No rash, cyanosis or gangrene Sclera anicteric, throat clear No jvd or bruits Chest bilat soft crackles at bases 1/3 up, no ^wob RRR no MRG  Abd soft ntnd no mass or ascites +bs GU defer MS no joint effusions or deformity  Ext trace LE edema, no wounds or ulcers Neuro is alert, Ox 3 , nf  R IJ TDC        BP 120- 160 / 77- 82    CXR - IMPRESSION: Cardiomegaly with pulmonary vascular congestion. Diffuse interstitial prominence likely represents interstitial pulmonary edema. Suspect a degree of congestive heart failure.    OP HD: GOC TTS  from sept 2020 > 4h 450/800  2/2 bath Hep 10000u     Assessment/ Plan: 1. SOB / hypoxemia - CXR w/ IS edema pattern, vol excess or COVID related can't be sure. K+ 6.3. Will plan for dialysis tonight as a priority.  2. ESRD - MWF HD. Last HD Friday.  3. COVID +infection - per pmd 4. DM on insulin  5. HTN on metoprolol 6. Chronic pain - on Oxy IR 7. HL 8. Anemia ckd - get records 9. MBD ckd - get records      Kelly Splinter  MD 02/18/2020, 5:06 PM  Recent Labs  Lab 02/18/20 1237 02/18/20 1317  WBC  --  8.5  HGB 9.2* 9.3*   Recent Labs  Lab 02/18/20 1237 02/18/20 1317  K  5.8* 6.3*  BUN  --  87*  CREATININE  --  9.31*  CALCIUM  --  9.7

## 2020-02-19 ENCOUNTER — Encounter (HOSPITAL_COMMUNITY): Payer: Self-pay | Admitting: Internal Medicine

## 2020-02-19 ENCOUNTER — Other Ambulatory Visit: Payer: Self-pay

## 2020-02-19 ENCOUNTER — Inpatient Hospital Stay (HOSPITAL_COMMUNITY): Payer: Medicare Other

## 2020-02-19 DIAGNOSIS — M7989 Other specified soft tissue disorders: Secondary | ICD-10-CM | POA: Diagnosis not present

## 2020-02-19 DIAGNOSIS — R7989 Other specified abnormal findings of blood chemistry: Secondary | ICD-10-CM

## 2020-02-19 DIAGNOSIS — J9601 Acute respiratory failure with hypoxia: Secondary | ICD-10-CM

## 2020-02-19 DIAGNOSIS — R609 Edema, unspecified: Secondary | ICD-10-CM

## 2020-02-19 LAB — D-DIMER, QUANTITATIVE: D-Dimer, Quant: 7.33 ug/mL-FEU — ABNORMAL HIGH (ref 0.00–0.50)

## 2020-02-19 LAB — COMPREHENSIVE METABOLIC PANEL
ALT: 17 U/L (ref 0–44)
AST: 19 U/L (ref 15–41)
Albumin: 3.3 g/dL — ABNORMAL LOW (ref 3.5–5.0)
Alkaline Phosphatase: 72 U/L (ref 38–126)
Anion gap: 16 — ABNORMAL HIGH (ref 5–15)
BUN: 41 mg/dL — ABNORMAL HIGH (ref 6–20)
CO2: 26 mmol/L (ref 22–32)
Calcium: 9.5 mg/dL (ref 8.9–10.3)
Chloride: 96 mmol/L — ABNORMAL LOW (ref 98–111)
Creatinine, Ser: 5.95 mg/dL — ABNORMAL HIGH (ref 0.44–1.00)
GFR, Estimated: 8 mL/min — ABNORMAL LOW (ref >60)
Glucose, Bld: 109 mg/dL — ABNORMAL HIGH (ref 70–99)
Potassium: 4.2 mmol/L (ref 3.5–5.1)
Sodium: 138 mmol/L (ref 135–145)
Total Bilirubin: 0.8 mg/dL (ref 0.3–1.2)
Total Protein: 7.5 g/dL (ref 6.5–8.1)

## 2020-02-19 LAB — CBC WITH DIFFERENTIAL/PLATELET
Abs Immature Granulocytes: 0.04 10*3/uL (ref 0.00–0.07)
Basophils Absolute: 0.1 10*3/uL (ref 0.0–0.1)
Basophils Relative: 1 %
Eosinophils Absolute: 0.3 10*3/uL (ref 0.0–0.5)
Eosinophils Relative: 5 %
HCT: 30.2 % — ABNORMAL LOW (ref 36.0–46.0)
Hemoglobin: 9.6 g/dL — ABNORMAL LOW (ref 12.0–15.0)
Immature Granulocytes: 1 %
Lymphocytes Relative: 24 %
Lymphs Abs: 1.6 10*3/uL (ref 0.7–4.0)
MCH: 32.7 pg (ref 26.0–34.0)
MCHC: 31.8 g/dL (ref 30.0–36.0)
MCV: 102.7 fL — ABNORMAL HIGH (ref 80.0–100.0)
Monocytes Absolute: 0.6 10*3/uL (ref 0.1–1.0)
Monocytes Relative: 9 %
Neutro Abs: 4.1 10*3/uL (ref 1.7–7.7)
Neutrophils Relative %: 60 %
Platelets: 177 10*3/uL (ref 150–400)
RBC: 2.94 MIL/uL — ABNORMAL LOW (ref 3.87–5.11)
RDW: 13.8 % (ref 11.5–15.5)
WBC: 6.7 10*3/uL (ref 4.0–10.5)
nRBC: 0 % (ref 0.0–0.2)

## 2020-02-19 LAB — PHOSPHORUS: Phosphorus: 5.2 mg/dL — ABNORMAL HIGH (ref 2.5–4.6)

## 2020-02-19 LAB — BRAIN NATRIURETIC PEPTIDE: B Natriuretic Peptide: 724.9 pg/mL — ABNORMAL HIGH (ref 0.0–100.0)

## 2020-02-19 LAB — HEPARIN LEVEL (UNFRACTIONATED): Heparin Unfractionated: 0.14 IU/mL — ABNORMAL LOW (ref 0.30–0.70)

## 2020-02-19 LAB — GLUCOSE, CAPILLARY
Glucose-Capillary: 235 mg/dL — ABNORMAL HIGH (ref 70–99)
Glucose-Capillary: 415 mg/dL — ABNORMAL HIGH (ref 70–99)
Glucose-Capillary: 412 mg/dL — ABNORMAL HIGH (ref 70–99)

## 2020-02-19 LAB — FERRITIN: Ferritin: 1404 ng/mL — ABNORMAL HIGH (ref 11–307)

## 2020-02-19 LAB — TROPONIN I (HIGH SENSITIVITY): Troponin I (High Sensitivity): 70 ng/L — ABNORMAL HIGH (ref <18)

## 2020-02-19 LAB — MAGNESIUM: Magnesium: 1.9 mg/dL (ref 1.7–2.4)

## 2020-02-19 LAB — MRSA PCR SCREENING: MRSA by PCR: NEGATIVE

## 2020-02-19 LAB — CBG MONITORING, ED: Glucose-Capillary: 114 mg/dL — ABNORMAL HIGH (ref 70–99)

## 2020-02-19 LAB — PROCALCITONIN: Procalcitonin: 0.13 ng/mL

## 2020-02-19 LAB — C-REACTIVE PROTEIN: CRP: 1.7 mg/dL — ABNORMAL HIGH (ref <1.0)

## 2020-02-19 MED ORDER — INSULIN ASPART 100 UNIT/ML ~~LOC~~ SOLN
9.0000 [IU] | Freq: Once | SUBCUTANEOUS | Status: AC
  Administered 2020-02-19: 9 [IU] via SUBCUTANEOUS

## 2020-02-19 MED ORDER — HEPARIN SODIUM (PORCINE) 5000 UNIT/ML IJ SOLN
5000.0000 [IU] | Freq: Three times a day (TID) | INTRAMUSCULAR | Status: DC
  Administered 2020-02-19 – 2020-02-20 (×2): 5000 [IU] via SUBCUTANEOUS
  Filled 2020-02-19 (×2): qty 1

## 2020-02-19 MED ORDER — IOHEXOL 350 MG/ML SOLN
75.0000 mL | Freq: Once | INTRAVENOUS | Status: AC | PRN
  Administered 2020-02-19: 75 mL via INTRAVENOUS

## 2020-02-19 MED ORDER — IOHEXOL 350 MG/ML SOLN: 80.0000 mL | Freq: Once | INTRAVENOUS | Status: DC | PRN

## 2020-02-19 MED ORDER — HEPARIN (PORCINE) 25000 UT/250ML-% IV SOLN
1250.0000 [IU]/h | INTRAVENOUS | Status: DC
  Administered 2020-02-19: 1250 [IU]/h via INTRAVENOUS
  Filled 2020-02-19 (×2): qty 250

## 2020-02-19 MED ORDER — AEROCHAMBER PLUS FLO-VU LARGE MISC
Status: AC
  Administered 2020-02-19: 1
  Filled 2020-02-19: qty 1

## 2020-02-19 MED ORDER — INSULIN ASPART 100 UNIT/ML ~~LOC~~ SOLN: 19.0000 [IU] | Freq: Once | SUBCUTANEOUS | Status: DC

## 2020-02-19 MED ORDER — PRAMIPEXOLE DIHYDROCHLORIDE 0.25 MG PO TABS
0.5000 mg | ORAL_TABLET | Freq: Every day | ORAL | Status: DC | PRN
  Filled 2020-02-19: qty 2

## 2020-02-19 MED ORDER — ROSUVASTATIN CALCIUM 5 MG PO TABS
10.0000 mg | ORAL_TABLET | Freq: Every day | ORAL | Status: DC
  Administered 2020-02-19 – 2020-02-22 (×4): 10 mg via ORAL
  Filled 2020-02-19 (×4): qty 2

## 2020-02-19 NOTE — ED Notes (Signed)
Report given to Leanne RN

## 2020-02-19 NOTE — Progress Notes (Addendum)
PROGRESS NOTE                                                                                                                                                                                                             Patient Demographics:    [name], is a 55 y.o. female, DOB - 1965-07-14, XFG:[id number]  Outpatient Primary MD for the patient is Sandi Mariscal, MD    LOS - 1  Admit date - 02/18/2020    Chief Complaint  Patient presents with  . Shortness of Breath       Brief Narrative (HPI from H&P)  [name] is a 55 y.o. female with medical history significant of ESRD on HD, hypertension, hyperlipidemia, diabetes mellitus type 2, COVID-19 in 10/2018, and nonobstructive CAD presents with complaints of worsening shortness of breath, she also missed her dialysis session due to poor road conditions.  She was seen in the ER and found to have acute hypoxic respiratory failure, extremely high D-dimer, COVID-19 positive and was admitted for further treatment.   Subjective:    [name] today has, No headache, No chest pain, No abdominal pain - No Nausea, No new weakness tingling or numbness, mild SOB.   Assessment  & Plan :      1. Acute COVID-19 infection in a patient who is fully vaccinated and boosted and has had previous COVID-19 infection several months ago - she did have acute hypoxic respiratory failure but this seems more likely to be due to missed dialysis, inflammatory markers are stable except for D-dimer, since she has received remdesivir we will give her 3 doses, will monitor closely.  Encouraged the patient to sit up in chair in the daytime use I-S and flutter valve for pulmonary toiletry and then prone in bed when at night.  Will advance activity and titrate down oxygen as possible.  SpO2: 99 % O2 Flow Rate (L/min): 2 L/min  Recent Labs  Lab 02/18/20 1237 02/18/20 1248 02/18/20 1317  02/18/20 1359 02/18/20 1916 02/19/20 0527 02/19/20 0721  WBC  --   --  8.5  --   --  6.7  --   HGB 9.2*  --  9.3*  --   --  9.6*  --   HCT 27.0*  --  30.5*  --   --  30.2*  --  PLT  --   --  180  --   --  177  --   CRP  --   --   --   --  1.2* 1.7*  --   BNP  --   --   --  912.4*  --   --  724.9*  DDIMER  --   --   --   --  5.15* 7.33*  --   PROCALCITON  --   --   --   --  0.14  --   --   AST  --   --   --   --   --  19  --   ALT  --   --   --   --   --  17  --   ALKPHOS  --   --   --   --   --  72  --   BILITOT  --   --   --   --   --  0.8  --   ALBUMIN  --   --   --   --   --  3.3*  --   SARSCOV2NAA  --  POSITIVE*  --   --   --   --   --     2. Acute hypoxic respiratory failure due to missed dialysis session causing fluid overload and acute on chronic diastolic CHF, EF 63% on recent echocardiogram - renal following had HD on 02/18/2020, shortness of breath much improved, will benefit on 1 more run.  Try to get her off of oxygen advance activity  3. Wxtremely elevated D-dimer.  High risk for developing a clot due to underlying COVID infection, check leg ultrasound and CTA, low range therapeutic heparin drip for now.  4. Elevated troponin due to demand mismatch caused by hypoxia due to fluid overload and underlying ESRD, troponin rise and non-ACS pattern chest pain-free.  Supportive care.  5. Anemia of chronic disease.  Due to ESRD.  Monitor.  6. obesity.  BMI 36.  Follow with PCP.  7. dyslipidemia.  On statin continue.  8.  Chronic pain.  Home regimen.  9.  Essential hypertension.  On metoprolol along with as needed hydralazine.  10. Suspicious lung nodule.  Getting CT, outpatient pulmonary follow-up within 2 to 3 weeks of discharge most likely.    11. DM  Type II.  Recent A1c of 8.5  on 7030 along with sliding scale.  Lab Results  Component Value Date   HGBA1C 8.5 (H) 02/18/2020   CBG (last 3)  Recent Labs    02/19/20 0749  GLUCAP 114*        Condition -  Fair  Family Communication  : Patient will update her family herself  Code Status :  Full  Consults  :  Renal  Procedures  :   Leg [place]  CTA  PUD Prophylaxis : Pepcid  Disposition Plan  :    Status is: Inpatient  Remains inpatient appropriate because:IV treatments appropriate due to intensity of illness or inability to take PO   Dispo: The patient is from: Home              Anticipated d/c is to: Home              Anticipated d/c date is: 3 days              Patient currently is not medically stable to d/c.   DVT Prophylaxis  :  Heparin    Lab Results  Component Value Date   PLT 177 02/19/2020    Diet :  Diet Order            Diet renal/carb modified with fluid restriction Diet-HS Snack? Nothing; Fluid restriction: 1200 mL Fluid; Room service appropriate? Yes; Fluid consistency: Thin  Diet effective now                  Inpatient Medications  Scheduled Meds: . albuterol  2 puff Inhalation Q6H  . vitamin C  500 mg Oral Daily  . [START ON 02/20/2020] calcitRIOL  0.5 mcg Oral Q M,W,F-HD  . Chlorhexidine Gluconate Cloth  6 each Topical Q0600  . famotidine  10 mg Oral Daily  . ferric citrate  1,050 mg Oral TID WC   And  . ferric citrate  630 mg Oral With snacks  . hydrOXYzine  25 mg Oral Daily  . insulin aspart  0-6 Units Subcutaneous TID WC  . insulin aspart protamine- aspart  30 Units Subcutaneous BID WC  . methylPREDNISolone (SOLU-MEDROL) injection  50 mg Intravenous Q12H   Followed by  . [START ON 02/21/2020] predniSONE  50 mg Oral Daily  . metoprolol succinate  100 mg Oral Daily  . multivitamin  1 tablet Oral QHS  . pramipexole  0.5 mg Oral Daily  . rosuvastatin  10 mg Oral Daily  . zinc sulfate  220 mg Oral Daily   Continuous Infusions: . heparin    . remdesivir 100 mg in NS 100 mL     PRN Meds:.chlorpheniramine-HYDROcodone, guaiFENesin-dextromethorphan, hydrALAZINE, ondansetron **OR** ondansetron (ZOFRAN) IV, oxyCODONE  Antibiotics  :     Anti-infectives (From admission, onward)   Start     Dose/Rate Route Frequency Ordered Stop   02/19/20 1000  remdesivir 100 mg in sodium chloride 0.9 % 100 mL IVPB       "Followed by" Linked Group Details   100 mg 200 mL/hr over 30 Minutes Intravenous Daily 02/18/20 1744 02/23/20 0959   02/18/20 2000  remdesivir 200 mg in sodium chloride 0.9% 250 mL IVPB       "Followed by" Linked Group Details   200 mg 580 mL/hr over 30 Minutes Intravenous Once 02/18/20 1744 02/19/20 0019       Time Spent in minutes  30   Lala Lund M.D on 02/19/2020 at 9:48 AM  To page go to [email or web address]   [facility] -  Office  [phone]  See all Orders from today for further details    Objective:   Vitals:   02/19/20 0402 02/19/20 0500 02/19/20 0600 02/19/20 0700  BP:  (!) 127/105 (!) 151/61 (!) 156/97  Pulse:  78 69 74  Resp:  19 17 20   Temp:      TempSrc:      SpO2:  100% 100% 99%  Weight: 108.9 kg     Height: 5\' 8"  (1.727 m)       Wt Readings from Last 3 Encounters:  02/19/20 108.9 kg  10/26/19 108.9 kg  09/26/19 110.5 kg     Intake/Output Summary (Last 24 hours) at 02/19/2020 0948 Last data filed at 02/19/2020 0757 Gross per 24 hour  Intake 587.61 ml  Output 3017 ml  Net -2429.39 ml     Physical Exam  Awake Alert, No new F.N deficits, Normal affect [place].AT,PERRAL Supple Neck,No JVD, No cervical lymphadenopathy appriciated.  Symmetrical Chest wall movement, Good air movement bilaterally, CTAB RRR,No Gallops,Rubs or new Murmurs, No Parasternal Heave +ve  B.Sounds, Abd Soft, No tenderness, No organomegaly appriciated, No rebound - guarding or rigidity. No Cyanosis, Clubbing or edema, No new Rash or bruise       Data Review:    CBC Recent Labs  Lab 02/18/20 1237 02/18/20 1317 02/19/20 0527  WBC  --  8.5 6.7  HGB 9.2* 9.3* 9.6*  HCT 27.0* 30.5* 30.2*  PLT  --  180 177  MCV  --  104.8* 102.7*  MCH  --  32.0 32.7  MCHC  --  30.5 31.8  RDW  --  13.9 13.8   LYMPHSABS  --   --  1.6  MONOABS  --   --  0.6  EOSABS  --   --  0.3  BASOSABS  --   --  0.1    Recent Labs  Lab 02/18/20 1237 02/18/20 1317 02/18/20 1359 02/18/20 1610 02/18/20 1906 02/18/20 1916 02/19/20 0527 02/19/20 0721  NA 142 142  --   --   --   --  138  --   K 5.8* 6.3*  --   --   --   --  4.2  --   CL  --  100  --   --   --   --  96*  --   CO2  --  23  --   --   --   --  26  --   GLUCOSE  --  155*  --   --   --   --  109*  --   BUN  --  87*  --   --   --   --  41*  --   CREATININE  --  9.31*  --   --   --   --  5.95*  --   CALCIUM  --  9.7  --   --   --   --  9.5  --   AST  --   --   --   --   --   --  19  --   ALT  --   --   --   --   --   --  17  --   ALKPHOS  --   --   --   --   --   --  72  --   BILITOT  --   --   --   --   --   --  0.8  --   ALBUMIN  --   --   --   --   --   --  3.3*  --   MG  --   --   --   --   --   --  1.9  --   CRP  --   --   --   --   --  1.2* 1.7*  --   DDIMER  --   --   --   --   --  5.15* 7.33*  --   PROCALCITON  --   --   --   --   --  0.14  --   --   HGBA1C  --   --   --   --  8.5*  --   --   --   AMMONIA  --   --   --  31  --   --   --   --   BNP  --   --  912.4*  --   --   --   --  724.9*    ------------------------------------------------------------------------------------------------------------------ No results for input(s): CHOL, HDL, LDLCALC, TRIG, CHOLHDL, LDLDIRECT in the last 72 hours.  Lab Results  Component Value Date   HGBA1C 8.5 (H) 02/18/2020   ------------------------------------------------------------------------------------------------------------------ No results for input(s): TSH, T4TOTAL, T3FREE, THYROIDAB in the last 72 hours.  Invalid input(s): FREET3  Cardiac Enzymes No results for input(s): CKMB, TROPONINI, MYOGLOBIN in the last 168 hours.  Invalid input(s): CK ------------------------------------------------------------------------------------------------------------------    Component Value  Date/Time   BNP 724.9 (H) 02/19/2020 6283    Micro Results Recent Results (from the past 240 hour(s))  Resp Panel by RT-PCR (Flu A&B, Covid) Nasopharyngeal Swab     Status: Abnormal   Collection Time: 02/18/20 12:48 PM   Specimen: Nasopharyngeal Swab; Nasopharyngeal(NP) swabs in vial transport medium  Result Value Ref Range Status   SARS Coronavirus 2 by RT PCR POSITIVE (A) NEGATIVE Final    Comment: emailed L. Berdik RN 14:40 02/18/20 ([email or web address]) (NOTE) SARS-CoV-2 target nucleic acids are DETECTED.  The SARS-CoV-2 RNA is generally detectable in upper respiratory specimens during the acute phase of infection. Positive results are indicative of the presence of the identified virus, but do not rule out bacterial infection or co-infection with other pathogens not detected by the test. Clinical correlation with patient history and other diagnostic information is necessary to determine patient infection status. The expected result is Negative.  Fact Sheet for Patients: EntrepreneurPulse.com.au  Fact Sheet for Healthcare Providers: IncredibleEmployment.be  This test is not yet approved or cleared by the [place] FDA and  has been authorized for detection and/or diagnosis of SARS-CoV-2 by FDA under an Emergency Use Authorization (EUA).  This EUA will remain in effect (meaning this test can be used) for the duration of  the COVID-1 9 declaration under Section 564(b)(1) of the Act, 21 U.S.C. section 360bbb-3(b)(1), unless the authorization is terminated or revoked sooner.     Influenza A by PCR NEGATIVE NEGATIVE Final   Influenza B by PCR NEGATIVE NEGATIVE Final    Comment: (NOTE) The Xpert Xpress SARS-CoV-2/FLU/RSV plus assay is intended as an aid in the diagnosis of influenza from Nasopharyngeal swab specimens and should not be used as a sole basis for treatment. Nasal washings and aspirates are unacceptable for Xpert Xpress  SARS-CoV-2/FLU/RSV testing.  Fact Sheet for Patients: EntrepreneurPulse.com.au  Fact Sheet for Healthcare Providers: IncredibleEmployment.be  This test is not yet approved or cleared by the [place] FDA and has been authorized for detection and/or diagnosis of SARS-CoV-2 by FDA under an Emergency Use Authorization (EUA). This EUA will remain in effect (meaning this test can be used) for the duration of the COVID-19 declaration under Section 564(b)(1) of the Act, 21 U.S.C. section 360bbb-3(b)(1), unless the authorization is terminated or revoked.  Performed at [facility], [place] [address], [place], [place] [address]     Radiology Reports DG Chest Portable 1 View  Result Date: 02/18/2020 CLINICAL DATA:  Shortness of breath.  History of urologic carcinoma EXAM: PORTABLE CHEST 1 VIEW COMPARISON:  November 14, 2018 chest radiograph; PET-CT August 15, 2019. FINDINGS: Central catheter tip is in the right atrium. No pneumothorax. There is cardiomegaly with pulmonary venous hypertension. Small left pleural effusion noted. There is diffuse interstitial thickening, likely indicative of pulmonary edema. No consolidation. There is a nodular lesion in the left mid lung measuring 1.2 x 1.1 cm. There is a questionable nodular area overlying the inferior right hilum measuring 2.3 x 2.1 cm. No appreciable adenopathy by radiography.  No bone  lesions. IMPRESSION: 1. Well-defined nodular opacity in left perihilar region measuring 1.2 x 1.1 cm. Questionable less well-defined nodular opacity right lower lung region medially measuring 2.3 x 2.1 cm. These findings raise concern for metastatic foci. Correlation with chest CT, ideally with intravenous contrast, advised to further evaluate. 2. Cardiomegaly with pulmonary vascular congestion. Diffuse interstitial prominence likely represents interstitial pulmonary edema. Suspect a degree of congestive heart failure. Note that  lymphangitic spread of tumor potentially could present in this manner. CT may be helpful for further delineation in this regard. 3.  Central catheter tip in right atrium.  No pneumothorax. Electronically Signed   By: Lowella Grip III M.D.   On: 02/18/2020 12:10

## 2020-02-19 NOTE — Progress Notes (Signed)
Pt arrived on unit on 2L [place]. Pt AOx4. Placed in chair. Call button in reach. Pt able to voice understanding of when to call. Skin charted in flowsheet. Will continue to monitor.

## 2020-02-19 NOTE — Progress Notes (Signed)
[place] for heparin Indication: r/o VTE   Heparin dosing weight: 88.6kg  Labs: Recent Labs    02/18/20 1237 02/18/20 1317 02/19/20 0527  HGB 9.2* 9.3* 9.6*  HCT 27.0* 30.5* 30.2*  PLT  --  180 177  CREATININE  --  9.31* 5.95*    Assessment: 22 yof with hx of ESRD on HD presenting with SOB, found to be COVID-19+. Patient with rising d-dimer (5.15>7.33) on heparin SQ for VTE prophylaxis (last dose 1/18 at 0702), and MD concerned patient at high-risk for VTE. Pharmacy consulted to dose heparin drip targeting low therapeutic range until d-dimer drops below 2 per discussion with Dr. Candiss Norse. Hg low but stable, plt WNL. No active bleed issues reported. Patient is not on anticoagulation PTA.  Goal of Therapy:  Heparin level 0.3-0.5 units/ml Monitor platelets by anticoagulation protocol: Yes   Plan:  D/c heparin SQ No bolus with recent heparin SQ dose. Start heparin at 1250 units/hr Check 8hr heparin level Monitor daily CBC, s/sx bleeding F/u r/o VTE - LE dopplers pending Follow d-dimer daily and transition back to prophylactic dosing when <2 if no VTE identified   Arturo Morton, PharmD, BCPS Clinical Pharmacist 02/19/2020 7:43 AM

## 2020-02-19 NOTE — Progress Notes (Signed)
Lower extremity venous has been completed.   Preliminary results in CV Proc.   Abram Sander 02/19/2020 10:20 AM

## 2020-02-19 NOTE — ED Notes (Signed)
SDU 

## 2020-02-19 NOTE — Progress Notes (Signed)
[place] for heparin Indication: r/o VTE   Heparin dosing weight: 88.6kg  Labs: Recent Labs    02/18/20 1237 02/18/20 1317 02/19/20 0527 02/19/20 1645  HGB 9.2* 9.3* 9.6*  --   HCT 27.0* 30.5* 30.2*  --   PLT  --  180 177  --   HEPARINUNFRC  --   --   --  0.14*  CREATININE  --  9.31* 5.95*  --     Assessment: 10 yof with hx of ESRD on HD presenting with SOB, found to be COVID-19+. Patient with rising d-dimer (5.15>7.33) on heparin SQ for VTE prophylaxis (last dose 1/18 at 0702), and MD concerned patient at high-risk for VTE. Pharmacy consulted to dose heparin drip targeting low therapeutic range until d-dimer drops below 2 per discussion with Dr. Candiss Norse. Hg low but stable, plt WNL. No active bleed issues reported. Patient is not on anticoagulation PTA.  Heparin level subtherapeutic (0.14) on gtt at 1250 units/hr. LE U/S with no evidence of DVT. CT chest with no evidence of PE.  Goal of Therapy:  Heparin level 0.3-0.5 units/ml Monitor platelets by anticoagulation protocol: Yes   Plan:  Discussed results of studies with Dr. Candiss Norse, okay to d/c IV heparin and change back to SQ. D/c IV heparin and labs Restart heparin 5000 units SQ tonight at [place], PharmD, BCPS Please see amion for complete clinical pharmacist phone list 02/19/2020 5:59 PM

## 2020-02-19 NOTE — ED Notes (Signed)
SDU Breakfast Ordered 

## 2020-02-19 NOTE — Evaluation (Signed)
Physical Therapy Evaluation Patient Details Name: [name] MRN: [id number] DOB: [birth year] Today's Date: 02/19/2020   History of Present Illness  Pt is a 55 y/o female admitted secondary to increased SOB; pt missed HD session secondary to weather. Pt also found to be COVID +. PMH includes HTN, ESRD on HD, DM, and COVID in 2020.  Clinical Impression  Pt admitted secondary to problem above with deficits below. Pt with increased sway and mild unsteadiness during ambulation to chair. Pt requiring min to min guard A and reporting some dizziness. VSS and oxygen sats >95% on 2L throughout. Pt reports she has been using rollator that was a family members, but is now worn out. Requesting a new one. Pt also reports working with outpatient PT, so recommend resuming when able. Will continue to follow acutely to maximize functional mobility independence and safety.     Follow Up Recommendations Outpatient PT (resume outpatient PT at d/c)    Equipment Recommendations  Other (comment) (4 wheeled walker with seat (rollator))    Recommendations for Other Services       Precautions / Restrictions Precautions Precautions: Fall Restrictions Weight Bearing Restrictions: No      Mobility  Bed Mobility               General bed mobility comments: Sitting at EOB upon entry    Transfers Overall transfer level: Needs assistance Equipment used: None Transfers: Sit to/from Stand Sit to Stand: Min assist         General transfer comment: Min A for steadying assist to stand. Increased sway noted in standing.  Ambulation/Gait Ambulation/Gait assistance: Min assist;Min guard Gait Distance (Feet): 15 Feet Assistive device: IV Pole Gait Pattern/deviations: Step-through pattern;Decreased stride length;Drifts right/left Gait velocity: Decreased   General Gait Details: Increased sway noted during ambulation. Pt reporting some dizziness, however, VSS. Oxygen at >95% on 2L [place]  throughout.  Stairs            Wheelchair Mobility    Modified Rankin (Stroke Patients Only)       Balance Overall balance assessment: Needs assistance Sitting-balance support: No upper extremity supported Sitting balance-Leahy Scale: Good     Standing balance support: Single extremity supported;During functional activity Standing balance-Leahy Scale: Poor Standing balance comment: Reliant on at least 1 UE and external support                             Pertinent Vitals/Pain Pain Assessment: No/denies pain    Home Living Family/patient expects to be discharged to:: Private residence Living Arrangements: Spouse/significant other;Children Available Help at Discharge: Family;Available 24 hours/day Type of Home: House Home Access: Level entry     Home Layout: One level Home Equipment: Walker - 4 wheels;Cane - single point;Shower seat;Hand held shower head Additional Comments: Reports her rollator was her mothers and is very worn out    Prior Function Level of Independence: Independent with assistive device(s)         Comments: Uses cane vs rollator     Hand Dominance        Extremity/Trunk Assessment   Upper Extremity Assessment Upper Extremity Assessment: Defer to OT evaluation    Lower Extremity Assessment Lower Extremity Assessment: Generalized weakness    Cervical / Trunk Assessment Cervical / Trunk Assessment: Normal  Communication   Communication: No difficulties  Cognition Arousal/Alertness: Awake/alert Behavior During Therapy: WFL for tasks assessed/performed Overall Cognitive Status: Within Functional Limits for tasks assessed  General Comments      Exercises     Assessment/Plan    PT Assessment Patient needs continued PT services  PT Problem List Decreased strength;Decreased activity tolerance;Decreased balance;Decreased mobility;Decreased knowledge of use of  DME;Decreased knowledge of precautions       PT Treatment Interventions DME instruction;Gait training;Functional mobility training;Therapeutic activities;Therapeutic exercise;Balance training;Patient/family education    PT Goals (Current goals can be found in the Care Plan section)  Acute Rehab PT Goals Patient Stated Goal: to go home PT Goal Formulation: With patient Time For Goal Achievement: 03/04/20 Potential to Achieve Goals: Good    Frequency Min 3X/week   Barriers to discharge        Co-evaluation               AM-PAC PT "6 Clicks" Mobility  Outcome Measure Help needed turning from your back to your side while in a flat bed without using bedrails?: None Help needed moving from lying on your back to sitting on the side of a flat bed without using bedrails?: A Little Help needed moving to and from a bed to a chair (including a wheelchair)?: A Little Help needed standing up from a chair using your arms (e.g., wheelchair or bedside chair)?: A Little Help needed to walk in hospital room?: A Little Help needed climbing 3-5 steps with a railing? : A Little 6 Click Score: 19    End of Session Equipment Utilized During Treatment: Oxygen Activity Tolerance: Treatment limited secondary to medical complications (Comment) (dizziness) Patient left: in chair;with call bell/phone within reach Nurse Communication: Mobility status PT Visit Diagnosis: Unsteadiness on feet (R26.81);Muscle weakness (generalized) (M62.81)    Time: 6433-2951 PT Time Calculation (min) (ACUTE ONLY): 18 min   Charges:   PT Evaluation $PT Eval Moderate Complexity: 1 Mod          Reuel Derby, PT, DPT  Acute Rehabilitation Services  Pager: [phone] Office: [phone]   Rudean Hitt 02/19/2020, 10:50 AM

## 2020-02-19 NOTE — Progress Notes (Signed)
[facility] Progress Note  Subjective: seen in room, feeling better, breathing better. 3 L off yest w/ HD.   Vitals:   02/19/20 0500 02/19/20 0600 02/19/20 0700 02/19/20 0953  BP: (!) 127/105 (!) 151/61 (!) 156/97 (!) 183/90  Pulse: 78 69 74 82  Resp: 19 17 20 20   Temp:    98.3 F (36.8 C)  TempSrc:    Oral  SpO2: 100% 100% 99% 99%  Weight:      Height:        Exam:   alert, nad   no jvd  Chest cta bilat  Cor reg no RG  Abd soft ntnd no ascites   Ext no LE edema   Alert, NF, ox3   R IJ TDC     CXR - IMPRESSION: Cardiomegaly with pulmonary vascular congestion. Diffuse interstitial prominence likely represents interstitial pulmonary edema. Suspect a degree of congestive heart failure.    OP HD: GO MWF   4h 14min  2/2 bath 108.5kg   TDC  Hep 10,000  - calc 0.5 ug po tiw   Assessment/ Plan: 1. SOB / hypoxemia - CXR IS pattern on admit. Breathing better, exam better post HD w/ 3 L UF yest.  2. ESRD - MWF HD. HD here yesterday, plan HD tomorrow. 3. COVID +infection - per pmd 4. DM on insulin  5. HTN/volume -  on metoprolol only. At dry wt today.  6. Chronic pain - on Oxy IR 7. 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 albuterol  2 puff Inhalation Q6H  . vitamin C  500 mg Oral Daily  . [START ON 02/20/2020] calcitRIOL  0.5 mcg Oral Q M,W,F-HD  . Chlorhexidine Gluconate Cloth  6 each Topical Q0600  . famotidine  10 mg Oral Daily  . ferric citrate  1,050 mg Oral TID WC   And  . ferric citrate  630 mg Oral With snacks  . hydrOXYzine  25 mg Oral Daily  . insulin aspart  0-6 Units Subcutaneous TID WC  . insulin aspart protamine- aspart  30 Units Subcutaneous BID WC  .  methylPREDNISolone (SOLU-MEDROL) injection  50 mg Intravenous Q12H   Followed by  . [START ON 02/21/2020] predniSONE  50 mg Oral Daily  . metoprolol succinate  100 mg Oral Daily  . multivitamin  1 tablet Oral QHS  . pramipexole  0.5 mg Oral Daily  . rosuvastatin  10 mg Oral Daily  . zinc sulfate  220 mg Oral Daily   . heparin    . remdesivir 100 mg in NS 100 mL 100 mg (02/19/20 1002)   chlorpheniramine-HYDROcodone, guaiFENesin-dextromethorphan, hydrALAZINE, iohexol, ondansetron **OR** ondansetron (ZOFRAN) IV, oxyCODONE

## 2020-02-19 NOTE — ED Notes (Signed)
Attempted report unsuccessful 

## 2020-02-20 LAB — PROCALCITONIN: Procalcitonin: 0.12 ng/mL

## 2020-02-20 LAB — CBC WITH DIFFERENTIAL/PLATELET
Abs Immature Granulocytes: 0.05 10*3/uL (ref 0.00–0.07)
Basophils Absolute: 0 10*3/uL (ref 0.0–0.1)
Basophils Relative: 0 %
Eosinophils Absolute: 0 10*3/uL (ref 0.0–0.5)
Eosinophils Relative: 0 %
HCT: 28.6 % — ABNORMAL LOW (ref 36.0–46.0)
Hemoglobin: 9.2 g/dL — ABNORMAL LOW (ref 12.0–15.0)
Immature Granulocytes: 1 %
Lymphocytes Relative: 7 %
Lymphs Abs: 0.5 10*3/uL — ABNORMAL LOW (ref 0.7–4.0)
MCH: 31.6 pg (ref 26.0–34.0)
MCHC: 32.2 g/dL (ref 30.0–36.0)
MCV: 98.3 fL (ref 80.0–100.0)
Monocytes Absolute: 0.3 10*3/uL (ref 0.1–1.0)
Monocytes Relative: 4 %
Neutro Abs: 5.8 10*3/uL (ref 1.7–7.7)
Neutrophils Relative %: 88 %
Platelets: 172 10*3/uL (ref 150–400)
RBC: 2.91 MIL/uL — ABNORMAL LOW (ref 3.87–5.11)
RDW: 13.5 % (ref 11.5–15.5)
WBC: 6.6 10*3/uL (ref 4.0–10.5)
nRBC: 0 % (ref 0.0–0.2)

## 2020-02-20 LAB — COMPREHENSIVE METABOLIC PANEL
ALT: 18 U/L (ref 0–44)
AST: 21 U/L (ref 15–41)
Albumin: 3.6 g/dL (ref 3.5–5.0)
Alkaline Phosphatase: 79 U/L (ref 38–126)
Anion gap: 18 — ABNORMAL HIGH (ref 5–15)
BUN: 68 mg/dL — ABNORMAL HIGH (ref 6–20)
CO2: 22 mmol/L (ref 22–32)
Calcium: 9.8 mg/dL (ref 8.9–10.3)
Chloride: 94 mmol/L — ABNORMAL LOW (ref 98–111)
Creatinine, Ser: 7.94 mg/dL — ABNORMAL HIGH (ref 0.44–1.00)
GFR, Estimated: 6 mL/min — ABNORMAL LOW (ref >60)
Glucose, Bld: 309 mg/dL — ABNORMAL HIGH (ref 70–99)
Potassium: 5.8 mmol/L — ABNORMAL HIGH (ref 3.5–5.1)
Sodium: 134 mmol/L — ABNORMAL LOW (ref 135–145)
Total Bilirubin: 0.7 mg/dL (ref 0.3–1.2)
Total Protein: 8.3 g/dL — ABNORMAL HIGH (ref 6.5–8.1)

## 2020-02-20 LAB — BRAIN NATRIURETIC PEPTIDE: B Natriuretic Peptide: 1385.7 pg/mL — ABNORMAL HIGH (ref 0.0–100.0)

## 2020-02-20 LAB — GLUCOSE, CAPILLARY
Glucose-Capillary: 369 mg/dL — ABNORMAL HIGH (ref 70–99)
Glucose-Capillary: 434 mg/dL — ABNORMAL HIGH (ref 70–99)
Glucose-Capillary: 418 mg/dL — ABNORMAL HIGH (ref 70–99)
Glucose-Capillary: 347 mg/dL — ABNORMAL HIGH (ref 70–99)

## 2020-02-20 LAB — C-REACTIVE PROTEIN: CRP: 2.4 mg/dL — ABNORMAL HIGH (ref <1.0)

## 2020-02-20 LAB — D-DIMER, QUANTITATIVE: D-Dimer, Quant: 4.74 ug/mL-FEU — ABNORMAL HIGH (ref 0.00–0.50)

## 2020-02-20 LAB — MAGNESIUM: Magnesium: 2.3 mg/dL (ref 1.7–2.4)

## 2020-02-20 MED ORDER — METHYLPREDNISOLONE SODIUM SUCC 40 MG IJ SOLR: 30.0000 mg | Freq: Every day | INTRAMUSCULAR | Status: DC

## 2020-02-20 MED ORDER — HYDRALAZINE HCL 20 MG/ML IJ SOLN
10.0000 mg | Freq: Four times a day (QID) | INTRAMUSCULAR | Status: DC | PRN
  Administered 2020-02-20: 10 mg via INTRAVENOUS
  Filled 2020-02-20: qty 1

## 2020-02-20 MED ORDER — INSULIN ASPART 100 UNIT/ML ~~LOC~~ SOLN
12.0000 [IU] | Freq: Once | SUBCUTANEOUS | Status: AC
  Administered 2020-02-20: 12 [IU] via SUBCUTANEOUS

## 2020-02-20 MED ORDER — SODIUM CHLORIDE 0.9% FLUSH: 10.0000 mL | INTRAVENOUS | Status: DC | PRN

## 2020-02-20 MED ORDER — METHYLPREDNISOLONE SODIUM SUCC 40 MG IJ SOLR
20.0000 mg | Freq: Every day | INTRAMUSCULAR | Status: DC
  Administered 2020-02-21 – 2020-02-22 (×2): 20 mg via INTRAVENOUS
  Filled 2020-02-20 (×2): qty 1

## 2020-02-20 MED ORDER — HEPARIN SODIUM (PORCINE) 5000 UNIT/ML IJ SOLN
7500.0000 [IU] | Freq: Three times a day (TID) | INTRAMUSCULAR | Status: DC
  Administered 2020-02-20 – 2020-02-22 (×6): 7500 [IU] via SUBCUTANEOUS
  Filled 2020-02-20 (×6): qty 2

## 2020-02-20 MED ORDER — INSULIN ASPART 100 UNIT/ML ~~LOC~~ SOLN
0.0000 [IU] | Freq: Three times a day (TID) | SUBCUTANEOUS | Status: DC
  Administered 2020-02-20: 15 [IU] via SUBCUTANEOUS
  Administered 2020-02-21 – 2020-02-22 (×4): 3 [IU] via SUBCUTANEOUS

## 2020-02-20 MED ORDER — SODIUM CHLORIDE 0.9% FLUSH
10.0000 mL | Freq: Two times a day (BID) | INTRAVENOUS | Status: DC
  Administered 2020-02-20 – 2020-02-22 (×5): 10 mL

## 2020-02-20 MED ORDER — INSULIN ASPART 100 UNIT/ML ~~LOC~~ SOLN
3.0000 [IU] | Freq: Three times a day (TID) | SUBCUTANEOUS | Status: DC
  Administered 2020-02-21 – 2020-02-22 (×4): 3 [IU] via SUBCUTANEOUS

## 2020-02-20 MED ORDER — AMLODIPINE BESYLATE 10 MG PO TABS
10.0000 mg | ORAL_TABLET | Freq: Every day | ORAL | Status: DC
  Administered 2020-02-20 – 2020-02-22 (×3): 10 mg via ORAL
  Filled 2020-02-20 (×4): qty 1

## 2020-02-20 MED ORDER — INSULIN ASPART 100 UNIT/ML ~~LOC~~ SOLN
0.0000 [IU] | Freq: Every day | SUBCUTANEOUS | Status: DC
  Administered 2020-02-21: 3 [IU] via SUBCUTANEOUS

## 2020-02-20 NOTE — Progress Notes (Incomplete)
   02/20/20 0739 02/20/20 1148  Vitals  BP (!) 186/75 (!) 198/86  MAP (mmHg) 105 112  BP Location Left Wrist Left Wrist  BP Method Automatic Automatic  Patient Position (if appropriate) Sitting Sitting  Pulse Rate 80 86   Refused amlodipine this morning saying her nephrologist said not to take it because dialysis drops her BP too low. She has dialysis this evening. She got 100 mg of metoprolol xL in the morning. Dr. Candiss Norse notified.

## 2020-02-20 NOTE — Evaluation (Signed)
Occupational Therapy Evaluation Patient Details Name: [name] MRN: [id number] DOB: 1965/11/17 Today's Date: 02/20/2020    History of Present Illness Pt is a 55 y/o female admitted secondary to increased SOB; pt missed HD session secondary to weather. Pt also found to be COVID +. PMH includes HTN, ESRD on HD, DM, and COVID in 2020.   Clinical Impression   Pt admitted with above. She demonstrates the below listed deficits and will benefit from continued OT to maximize safety and independence with BADLs.  Pt presents to OT with generalized weakness, mild balance deficits and decreased activity tolerance.  She currently requires min guard assist - min A for ADLs, and min guard assist for functional mobility.  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  She reports she is able to perform light cooking and cleaning and she drives sometimes.  She was able to tolerate activity this date with Sp02 94-97%  On RA and DOE 3/4.   Anticipate good progress.  Will follow acutely.       Follow Up Recommendations  No OT follow up;Supervision - Intermittent    Equipment Recommendations  None recommended by OT    Recommendations for Other Services       Precautions / Restrictions Precautions Precautions: Fall      Mobility Bed Mobility               General bed mobility comments: Sitting at EOB upon entry    Transfers Overall transfer level: Needs assistance Equipment used: None Transfers: Sit to/from Stand;Stand Pivot Transfers Sit to Stand: Min guard Stand pivot transfers: Min guard       General transfer comment: min guard for safety    Balance Overall balance assessment: Needs assistance Sitting-balance support: No upper extremity supported Sitting balance-Leahy Scale: Good     Standing balance support: Single extremity supported;During functional  activity Standing balance-Leahy Scale: Poor Standing balance comment: Reliant on at least 1 UE and external support                           ADL either performed or assessed with clinical judgement   ADL Overall ADL's : Needs assistance/impaired Eating/Feeding: Modified independent   Grooming: Wash/dry hands;Wash/dry face;Oral care;Brushing hair;Supervision/safety;Standing;Sitting   Upper Body Bathing: Supervision/ safety;Set up;Sitting   Lower Body Bathing: Min guard;Sit to/from stand   Upper Body Dressing : Set up;Supervision/safety;Sitting   Lower Body Dressing: Min guard;Sit to/from stand;Minimal assistance Lower Body Dressing Details (indicate cue type and reason): Pt reports she is unable to tie shoes.  Was able to don/doff socks mod I Toilet Transfer: Min guard;Ambulation;Comfort height toilet;Grab bars;RW   Toileting- Water quality scientist and Hygiene: Min guard;Sit to/from stand       Functional mobility during ADLs: Min guard;Rolling walker General ADL Comments: DOE 3/4 with activity     Vision Patient Visual Report: No change from baseline       Perception     Praxis      Pertinent Vitals/Pain Pain Assessment: No/denies pain     Hand Dominance Right   Extremity/Trunk Assessment Upper Extremity Assessment Upper Extremity Assessment: Generalized weakness (h/o neuropathy bil  hands)   Lower Extremity Assessment Lower Extremity Assessment: Defer to PT evaluation   Cervical / Trunk Assessment Cervical / Trunk Assessment: Normal   Communication Communication Communication: No  difficulties   Cognition Arousal/Alertness: Awake/alert Behavior During Therapy: WFL for tasks assessed/performed Overall Cognitive Status: Within Functional Limits for tasks assessed                                     General Comments  Sp02 94-97% on RA with activity; HR 85    Exercises     Shoulder Instructions      Home Living  Family/patient expects to be discharged to:: Private residence Living Arrangements: Spouse/significant other;Children Available Help at Discharge: Family;Available 24 hours/day Type of Home: House Home Access: Level entry     Home Layout: One level     Bathroom Shower/Tub: Occupational psychologist: Handicapped height     Home Equipment: Environmental consultant - 4 wheels;Cane - single point;Shower seat;Hand held shower head   Additional Comments: Reports her rollator was her mothers and is very worn out.  She lives with her son and spouse who assist as needed.  Pt reports she has access to a w/c that is her sister in laws      Prior Functioning/Environment Level of Independence: Independent with assistive device(s);Needs assistance  Gait / Transfers Assistance Needed: mod I with a cane or rollator ADL's / Homemaking Assistance Needed: Pt reports she is unable to tie her shoes so family assists.  They also assist wtih carrying meals and items to her due to neuropathy in her hands   Comments: Uses cane vs rollator        OT Problem List: Decreased strength;Decreased activity tolerance;Impaired balance (sitting and/or standing);Decreased safety awareness;Cardiopulmonary status limiting activity;Obesity      OT Treatment/Interventions: Self-care/ADL training;Therapeutic exercise;Energy conservation;DME and/or AE instruction;Therapeutic activities;Patient/family education;Balance training    OT Goals(Current goals can be found in the care plan section) Acute Rehab OT Goals Patient Stated Goal: to get my strength back OT Goal Formulation: With patient Time For Goal Achievement: 03/05/20 Potential to Achieve Goals: Good ADL Goals Pt Will Perform Grooming: with supervision;standing Pt Will Perform Lower Body Bathing: with supervision;with adaptive equipment;sit to/from stand Pt Will Perform Lower Body Dressing: with supervision;with adaptive equipment;sit to/from stand Pt Will Transfer to  Toilet: with supervision;ambulating;regular height toilet;grab bars Pt Will Perform Toileting - Clothing Manipulation and hygiene: with supervision;sit to/from stand Pt/caregiver will Perform Home Exercise Program: Increased strength;Right Upper extremity;Left upper extremity;With Supervision;With written HEP provided Additional ADL Goal #1: Pt will independently incorporate energy conservation strategies during ADLs  OT Frequency: Min 2X/week   Barriers to D/C:            Co-evaluation              AM-PAC OT "6 Clicks" Daily Activity     Outcome Measure Help from another person eating meals?: None Help from another person taking care of personal grooming?: A Little Help from another person toileting, which includes using toliet, bedpan, or urinal?: A Little Help from another person bathing (including washing, rinsing, drying)?: A Little Help from another person to put on and taking off regular upper body clothing?: A Little Help from another person to put on and taking off regular lower body clothing?: A Little 6 Click Score: 19   End of Session Equipment Utilized During Treatment: Rolling walker Nurse Communication: Mobility status  Activity Tolerance: Patient tolerated treatment well Patient left: in bed;with call bell/phone within reach  OT Visit Diagnosis: Unsteadiness on feet (R26.81)  Time: 4301-4840 OT Time Calculation (min): 24 min Charges:  OT General Charges $OT Visit: 1 Visit OT Evaluation $OT Eval Moderate Complexity: 1 Mod OT Treatments $Self Care/Home Management : 8-22 mins  Nilsa Nutting., OTR/L Acute Rehabilitation Services Pager [phone] Office [phone]   Lucille Passy M 02/20/2020, 1:12 PM

## 2020-02-20 NOTE — Progress Notes (Signed)
[facility] Progress Note  Subjective: seen in room, has phlegm coming up, no SOB  Vitals:   02/19/20 2340 02/20/20 0348 02/20/20 0739 02/20/20 1148  BP: (!) 172/85 (!) 192/77 (!) 186/75 (!) 198/86  Pulse: 73 73 80 86  Resp: 20 18 20 18   Temp: 98.7 F (37.1 C)  98.6 F (37 C) 98.7 F (37.1 C)  TempSrc: Oral Oral Axillary Axillary  SpO2: 95% 100% 95% 97%  Weight:      Height:        Exam:   alert, nad   no jvd  Chest cta bilat  Cor reg no RG  Abd soft ntnd no ascites   Ext no LE edema   Alert, NF, ox3   R IJ TDC     CXR - IMPRESSION: Cardiomegaly with pulmonary vascular congestion. Diffuse interstitial prominence likely represents interstitial pulmonary edema. Suspect a degree of congestive heart failure.    OP HD: GO MWF   4h 86min  2/2 bath 108.5kg   TDC  Hep 10,000  - calc 0.5 ug po tiw   Assessment/ Plan: 1. SOB / hypoxemia - CXR IS pattern on admit w/ SOB, improved sig w/ HD on 1/17.  2. ESRD - MWF HD. Next HD today 3. COVID +infection - per pmd 4. DM on insulin  5. HTN/volume -  on metoprolol only. At dry wt today.  6. Chronic pain - on Oxy IR 7. Anemia ckd - no esa, Hb 9.6 here, will consider if drops 8. MBD ckd - auryxia 4 ac tid, po vdra w/ hd 9. Dispo - stable for d/c from renal standpoint      Rob Milad Bublitz 02/20/2020, 3:45 PM   Recent Labs  Lab 02/19/20 0527 02/20/20 0206  K 4.2 5.8*  BUN 41* 68*  CREATININE 5.95* 7.94*  CALCIUM 9.5 9.8  PHOS 5.2*  --   HGB 9.6* 9.2*   Inpatient medications: . albuterol  2 puff Inhalation Q6H  . amLODipine  10 mg Oral Daily  . vitamin C  500 mg Oral Daily  . calcitRIOL  0.5 mcg Oral Q M,W,F-HD  . Chlorhexidine Gluconate Cloth  6 each Topical Q0600  . famotidine  10 mg Oral Daily  . ferric citrate  1,050 mg Oral TID WC   And  . ferric citrate  630 mg Oral With snacks  . heparin  7,500 Units Subcutaneous Q8H  . hydrOXYzine  25 mg Oral Daily  . insulin aspart  0-15 Units  Subcutaneous TID WC  . insulin aspart  0-5 Units Subcutaneous QHS  . insulin aspart  3 Units Subcutaneous TID WC  . insulin aspart protamine- aspart  30 Units Subcutaneous BID WC  . [START ON 02/21/2020] methylPREDNISolone (SOLU-MEDROL) injection  20 mg Intravenous Daily  . metoprolol succinate  100 mg Oral Daily  . multivitamin  1 tablet Oral QHS  . pramipexole  0.5 mg Oral Daily  . rosuvastatin  10 mg Oral Daily  . sodium chloride flush  10-40 mL Intracatheter Q12H  . zinc sulfate  220 mg Oral Daily   . remdesivir 100 mg in NS 100 mL 100 mg (02/20/20 1016)   chlorpheniramine-HYDROcodone, guaiFENesin-dextromethorphan, hydrALAZINE, iohexol, [DISCONTINUED] ondansetron **OR** ondansetron (ZOFRAN) IV, oxyCODONE, sodium chloride flush

## 2020-02-20 NOTE — Progress Notes (Signed)
Since BMI is 36, ok to increase 7500units TID per Dr. Candiss Norse.  Onnie Boer, PharmD, BCIDP, AAHIVP, CPP Infectious Disease Pharmacist 02/20/2020 10:38 AM

## 2020-02-20 NOTE — Progress Notes (Signed)
PROGRESS NOTE                                                                                                                                                                                                             Patient Demographics:    [name], is a 55 y.o. female, DOB - 09-07-1965, ZOX:[id number]  Outpatient Primary MD for the patient is Sandi Mariscal, MD    LOS - 2  Admit date - 02/18/2020    Chief Complaint  Patient presents with  . Shortness of Breath       Brief Narrative (HPI from H&P)  [name] is a 55 y.o. female with medical history significant of ESRD on HD, hypertension, hyperlipidemia, diabetes mellitus type 2, COVID-19 in 10/2018, and nonobstructive CAD presents with complaints of worsening shortness of breath, she also missed her dialysis session due to poor road conditions.  She was seen in the ER and found to have acute hypoxic respiratory failure, extremely high D-dimer, COVID-19 positive and was admitted for further treatment.   Subjective:    [name] today has, No headache, No chest pain, No abdominal pain - No Nausea, No new weakness tingling or numbness, mild SOB.   Assessment  & Plan :     1. Acute COVID-19 infection in a patient who is fully vaccinated and boosted and has had previous COVID-19 infection several months ago - she did have acute hypoxic respiratory failure but this seems more likely to be due to fluid overload caused by missed dialysis, inflammatory markers are stable except for D-dimer, since she has received remdesivir we will give her 3 doses, will monitor closely.  Encouraged the patient to sit up in chair in the daytime use I-S and flutter valve for pulmonary toiletry and then prone in bed when at night.  Will advance activity and titrate down oxygen as possible.  SpO2: 95 % O2 Flow Rate (L/min): 2 L/min  Recent Labs  Lab 02/18/20 1237 02/18/20 1248  02/18/20 1317 02/18/20 1359 02/18/20 1916 02/19/20 0527 02/19/20 0721 02/20/20 0206  WBC  --   --  8.5  --   --  6.7  --  6.6  HGB 9.2*  --  9.3*  --   --  9.6*  --  9.2*  HCT 27.0*  --  30.5*  --   --  30.2*  --  28.6*  PLT  --   --  180  --   --  177  --  172  CRP  --   --   --   --  1.2* 1.7*  --  2.4*  BNP  --   --   --  912.4*  --   --  724.9* 1,385.7*  DDIMER  --   --   --   --  5.15* 7.33*  --  4.74*  PROCALCITON  --   --   --   --  0.14  --  0.13 0.12  AST  --   --   --   --   --  19  --  21  ALT  --   --   --   --   --  17  --  18  ALKPHOS  --   --   --   --   --  72  --  79  BILITOT  --   --   --   --   --  0.8  --  0.7  ALBUMIN  --   --   --   --   --  3.3*  --  3.6  SARSCOV2NAA  --  POSITIVE*  --   --   --   --   --   --     2. ESRD with Acute hypoxic respiratory failure due to missed dialysis session causing fluid overload and acute on chronic diastolic CHF, EF 53% on recent echocardiogram - renal following had HD on 02/18/2020, had extra run of HD now on MWF schedule, still slightly short of breath, dialyze and monitor.  If all good likely discharge home on 02/21/2020.  3. Extremely elevated D-dimer.  High risk for developing a clot due to underlying COVID infection, -ve leg ultrasound and CTA, dimer trend improving on present dose heparin continue.  4. Elevated troponin due to demand mismatch caused by hypoxia due to fluid overload and underlying ESRD, troponin rise and non-ACS pattern chest pain-free.  Supportive care.  5. Anemia of chronic disease.  Due to ESRD.  Monitor.  6. obesity.  BMI 36.  Follow with PCP.  7. dyslipidemia.  On statin continue.  8.  Chronic pain.  Home regimen.  9.  Essential hypertension.  On metoprolol along with as needed hydralazine.  10. Suspicious lung nodule.  Getting CT, outpatient pulmonary follow-up within 2 to 3 weeks of discharge most likely.    11. DM  Type II.  Recent A1c of 8.5  on 7030 along with sliding scale.  Lab  Results  Component Value Date   HGBA1C 8.5 (H) 02/18/2020   CBG (last 3)  Recent Labs    02/19/20 1719 02/19/20 2111 02/20/20 0744  GLUCAP 415* 412* 369*        Condition - Fair  Family Communication  : Patient will update her family herself  Code Status :  Full  Consults  :  Renal  Procedures  :   Leg [place]  CTA  PUD Prophylaxis : Pepcid  Disposition Plan  :    Status is: Inpatient  Remains inpatient appropriate because:IV treatments appropriate due to intensity of illness or inability to take PO   Dispo: The patient is from: Home              Anticipated d/c is to: Home  Anticipated d/c date is: 3 days              Patient currently is not medically stable to d/c.   DVT Prophylaxis  :  Heparin    Lab Results  Component Value Date   PLT 172 02/20/2020    Diet :  Diet Order            Diet renal/carb modified with fluid restriction Diet-HS Snack? Nothing; Fluid restriction: 1200 mL Fluid; Room service appropriate? Yes; Fluid consistency: Thin  Diet effective now                  Inpatient Medications  Scheduled Meds: . albuterol  2 puff Inhalation Q6H  . amLODipine  10 mg Oral Daily  . vitamin C  500 mg Oral Daily  . calcitRIOL  0.5 mcg Oral Q M,W,F-HD  . Chlorhexidine Gluconate Cloth  6 each Topical Q0600  . famotidine  10 mg Oral Daily  . ferric citrate  1,050 mg Oral TID WC   And  . ferric citrate  630 mg Oral With snacks  . heparin  5,000 Units Subcutaneous Q8H  . hydrOXYzine  25 mg Oral Daily  . insulin aspart  0-6 Units Subcutaneous TID WC  . insulin aspart protamine- aspart  30 Units Subcutaneous BID WC  . [START ON 02/21/2020] methylPREDNISolone (SOLU-MEDROL) injection  30 mg Intravenous Daily  . metoprolol succinate  100 mg Oral Daily  . multivitamin  1 tablet Oral QHS  . pramipexole  0.5 mg Oral Daily  . rosuvastatin  10 mg Oral Daily  . zinc sulfate  220 mg Oral Daily   Continuous Infusions: . remdesivir 100 mg in  NS 100 mL 100 mg (02/19/20 1002)   PRN Meds:.chlorpheniramine-HYDROcodone, guaiFENesin-dextromethorphan, hydrALAZINE, iohexol, [DISCONTINUED] ondansetron **OR** ondansetron (ZOFRAN) IV, oxyCODONE  Antibiotics  :    Anti-infectives (From admission, onward)   Start     Dose/Rate Route Frequency Ordered Stop   02/19/20 1000  remdesivir 100 mg in sodium chloride 0.9 % 100 mL IVPB       "Followed by" Linked Group Details   100 mg 200 mL/hr over 30 Minutes Intravenous Daily 02/18/20 1744 02/23/20 0959   02/18/20 2000  remdesivir 200 mg in sodium chloride 0.9% 250 mL IVPB       "Followed by" Linked Group Details   200 mg 580 mL/hr over 30 Minutes Intravenous Once 02/18/20 1744 02/19/20 0019       Time Spent in minutes  30   Lala Lund M.D on 02/20/2020 at 8:55 AM  To page go to [email or web address]   [facility] -  Office  [phone]  See all Orders from today for further details    Objective:   Vitals:   02/19/20 2016 02/19/20 2340 02/20/20 0348 02/20/20 0739  BP: (!) 176/70 (!) 172/85 (!) 192/77 (!) 186/75  Pulse: 75 73 73 80  Resp: 18 20 18 20   Temp: 98.1 F (36.7 C) 98.7 F (37.1 C)  98.6 F (37 C)  TempSrc: Oral Oral Oral Axillary  SpO2: 90% 95% 100% 95%  Weight:      Height:        Wt Readings from Last 3 Encounters:  02/19/20 108.9 kg  10/26/19 108.9 kg  09/26/19 110.5 kg     Intake/Output Summary (Last 24 hours) at 02/20/2020 0855 Last data filed at 02/19/2020 1817 Gross per 24 hour  Intake 416.07 ml  Output --  Net 416.07 ml  Physical Exam  Awake Alert, No new F.N deficits, Normal affect [place].AT,PERRAL Supple Neck,No JVD, No cervical lymphadenopathy appriciated.  Symmetrical Chest wall movement, Good air movement bilaterally, few rales RRR,No Gallops, Rubs or new Murmurs, No Parasternal Heave +ve B.Sounds, Abd Soft, No tenderness, No organomegaly appriciated, No rebound - guarding or rigidity. No Cyanosis, Clubbing or edema, No new Rash or  bruise      Data Review:    CBC Recent Labs  Lab 02/18/20 1237 02/18/20 1317 02/19/20 0527 02/20/20 0206  WBC  --  8.5 6.7 6.6  HGB 9.2* 9.3* 9.6* 9.2*  HCT 27.0* 30.5* 30.2* 28.6*  PLT  --  180 177 172  MCV  --  104.8* 102.7* 98.3  MCH  --  32.0 32.7 31.6  MCHC  --  30.5 31.8 32.2  RDW  --  13.9 13.8 13.5  LYMPHSABS  --   --  1.6 0.5*  MONOABS  --   --  0.6 0.3  EOSABS  --   --  0.3 0.0  BASOSABS  --   --  0.1 0.0    Recent Labs  Lab 02/18/20 1237 02/18/20 1317 02/18/20 1359 02/18/20 1610 02/18/20 1906 02/18/20 1916 02/19/20 0527 02/19/20 0721 02/20/20 0206  NA 142 142  --   --   --   --  138  --  134*  K 5.8* 6.3*  --   --   --   --  4.2  --  5.8*  CL  --  100  --   --   --   --  96*  --  94*  CO2  --  23  --   --   --   --  26  --  22  GLUCOSE  --  155*  --   --   --   --  109*  --  309*  BUN  --  87*  --   --   --   --  41*  --  68*  CREATININE  --  9.31*  --   --   --   --  5.95*  --  7.94*  CALCIUM  --  9.7  --   --   --   --  9.5  --  9.8  AST  --   --   --   --   --   --  19  --  21  ALT  --   --   --   --   --   --  17  --  18  ALKPHOS  --   --   --   --   --   --  72  --  79  BILITOT  --   --   --   --   --   --  0.8  --  0.7  ALBUMIN  --   --   --   --   --   --  3.3*  --  3.6  MG  --   --   --   --   --   --  1.9  --  2.3  CRP  --   --   --   --   --  1.2* 1.7*  --  2.4*  DDIMER  --   --   --   --   --  5.15* 7.33*  --  4.74*  PROCALCITON  --   --   --   --   --  0.14  --  0.13 0.12  HGBA1C  --   --   --   --  8.5*  --   --   --   --   AMMONIA  --   --   --  31  --   --   --   --   --   BNP  --   --  912.4*  --   --   --   --  724.9* 1,385.7*    ------------------------------------------------------------------------------------------------------------------ No results for input(s): CHOL, HDL, LDLCALC, TRIG, CHOLHDL, LDLDIRECT in the last 72 hours.  Lab Results  Component Value Date   HGBA1C 8.5 (H) 02/18/2020    ------------------------------------------------------------------------------------------------------------------ No results for input(s): TSH, T4TOTAL, T3FREE, THYROIDAB in the last 72 hours.  Invalid input(s): FREET3  Cardiac Enzymes No results for input(s): CKMB, TROPONINI, MYOGLOBIN in the last 168 hours.  Invalid input(s): CK ------------------------------------------------------------------------------------------------------------------    Component Value Date/Time   BNP 1,385.7 (H) 02/20/2020 0206    Micro Results Recent Results (from the past 240 hour(s))  Resp Panel by RT-PCR (Flu A&B, Covid) Nasopharyngeal Swab     Status: Abnormal   Collection Time: 02/18/20 12:48 PM   Specimen: Nasopharyngeal Swab; Nasopharyngeal(NP) swabs in vial transport medium  Result Value Ref Range Status   SARS Coronavirus 2 by RT PCR POSITIVE (A) NEGATIVE Final    Comment: emailed L. Berdik RN 14:40 02/18/20 ([email or web address]) (NOTE) SARS-CoV-2 target nucleic acids are DETECTED.  The SARS-CoV-2 RNA is generally detectable in upper respiratory specimens during the acute phase of infection. Positive results are indicative of the presence of the identified virus, but do not rule out bacterial infection or co-infection with other pathogens not detected by the test. Clinical correlation with patient history and other diagnostic information is necessary to determine patient infection status. The expected result is Negative.  Fact Sheet for Patients: EntrepreneurPulse.com.au  Fact Sheet for Healthcare Providers: IncredibleEmployment.be  This test is not yet approved or cleared by the [place] FDA and  has been authorized for detection and/or diagnosis of SARS-CoV-2 by FDA under an Emergency Use Authorization (EUA).  This EUA will remain in effect (meaning this test can be used) for the duration of  the COVID-1 9 declaration under Section 564(b)(1) of the  Act, 21 U.S.C. section 360bbb-3(b)(1), unless the authorization is terminated or revoked sooner.     Influenza A by PCR NEGATIVE NEGATIVE Final   Influenza B by PCR NEGATIVE NEGATIVE Final    Comment: (NOTE) The Xpert Xpress SARS-CoV-2/FLU/RSV plus assay is intended as an aid in the diagnosis of influenza from Nasopharyngeal swab specimens and should not be used as a sole basis for treatment. Nasal washings and aspirates are unacceptable for Xpert Xpress SARS-CoV-2/FLU/RSV testing.  Fact Sheet for Patients: EntrepreneurPulse.com.au  Fact Sheet for Healthcare Providers: IncredibleEmployment.be  This test is not yet approved or cleared by the [place] FDA and has been authorized for detection and/or diagnosis of SARS-CoV-2 by FDA under an Emergency Use Authorization (EUA). This EUA will remain in effect (meaning this test can be used) for the duration of the COVID-19 declaration under Section 564(b)(1) of the Act, 21 U.S.C. section 360bbb-3(b)(1), unless the authorization is terminated or revoked.  Performed at [facility], [place] [address], [place], [place] [address]   MRSA PCR Screening     Status: None   Collection Time: 02/19/20 10:52 AM   Specimen: Nasal Mucosa; Nasopharyngeal  Result Value Ref Range Status   MRSA by  PCR NEGATIVE NEGATIVE Final    Comment:        The GeneXpert MRSA Assay (FDA approved for NASAL specimens only), is one component of a comprehensive MRSA colonization surveillance program. It is not intended to diagnose MRSA infection nor to guide or monitor treatment for MRSA infections. Performed at [facility], [place] [address], [place], [place] [address]     Radiology Reports CT ANGIO CHEST PE W OR WO CONTRAST  Result Date: 02/19/2020 CLINICAL DATA:  COVID positive. Acute respiratory failure with hypoxia. EXAM: CT ANGIOGRAPHY CHEST WITH CONTRAST TECHNIQUE: Multidetector CT imaging of the chest  was performed using the standard protocol during bolus administration of intravenous contrast. Multiplanar CT image reconstructions and MIPs were obtained to evaluate the vascular anatomy. CONTRAST:  79mL OMNIPAQUE IOHEXOL 350 MG/ML SOLN COMPARISON:  PET-CT August 15, 2019 and CT a chest November 14, 2018. FINDINGS: Cardiovascular: Satisfactory opacification of the pulmonary arteries to the mid segmental level. No evidence of pulmonary embolism. Cardiomegaly similar prior. No pericardial effusion. Left upper extremity PICC with tip in the right atrium. Mediastinum/Nodes: No significant change in the prominent/mildly enlarged mediastinal and axillary lymph nodes, for instance the mildly metabolic precarinal lymph node measures 1.5 cm in maximum axial diameter, and is unchanged when remeasured for consistency (series 5, image 33). Multinodular appearance of the thyroid with the largest nodule measuring 1.6 cm in the left lobe. The trachea and esophagus are otherwise unremarkable. Lungs/Pleura: Mosaic attenuation with multifocal ground-glass opacities and nodularity. No pleural effusion or pneumothorax. Upper Abdomen: No right upper pole renal neoplasm incompletely visualized on today's exam. Musculoskeletal: Multilevel degenerative changes spine. No suspicious lytic or blastic lesion of bone. Review of the MIP images confirms the above findings. IMPRESSION: 1. No evidence of pulmonary embolism. 2. Mosaic attenuation with multifocal ground-glass opacities and nodularity, which may represent multifocal pneumonia or edema. 3. No significant change in the prominent/mildly enlarged mediastinal and axillary lymph nodes, which may be reactive. 4. Enlarged multinodular appearance of the thyroid with a 1.6 cm left thyroid nodule, further evaluation with thyroid ultrasound could be utilized if not previously performed. Electronically Signed   By: Dahlia Bailiff MD   On: 02/19/2020 11:19   DG Chest Portable 1 View  Result Date:  02/18/2020 CLINICAL DATA:  Shortness of breath.  History of urologic carcinoma EXAM: PORTABLE CHEST 1 VIEW COMPARISON:  November 14, 2018 chest radiograph; PET-CT August 15, 2019. FINDINGS: Central catheter tip is in the right atrium. No pneumothorax. There is cardiomegaly with pulmonary venous hypertension. Small left pleural effusion noted. There is diffuse interstitial thickening, likely indicative of pulmonary edema. No consolidation. There is a nodular lesion in the left mid lung measuring 1.2 x 1.1 cm. There is a questionable nodular area overlying the inferior right hilum measuring 2.3 x 2.1 cm. No appreciable adenopathy by radiography.  No bone lesions. IMPRESSION: 1. Well-defined nodular opacity in left perihilar region measuring 1.2 x 1.1 cm. Questionable less well-defined nodular opacity right lower lung region medially measuring 2.3 x 2.1 cm. These findings raise concern for metastatic foci. Correlation with chest CT, ideally with intravenous contrast, advised to further evaluate. 2. Cardiomegaly with pulmonary vascular congestion. Diffuse interstitial prominence likely represents interstitial pulmonary edema. Suspect a degree of congestive heart failure. Note that lymphangitic spread of tumor potentially could present in this manner. CT may be helpful for further delineation in this regard. 3.  Central catheter tip in right atrium.  No pneumothorax. Electronically Signed   By: Gwyndolyn Saxon  Jasmine December III M.D.   On: 02/18/2020 12:10   VAS [place] LOWER EXTREMITY VENOUS (DVT)  Result Date: 02/19/2020  Lower Venous DVT Study Indications: Swelling, and Edema.  Comparison Study: no prior Performing Technologist: Abram Sander RVS  Examination Guidelines: A complete evaluation includes B-mode imaging, spectral Doppler, color Doppler, and power Doppler as needed of all accessible portions of each vessel. Bilateral testing is considered an integral part of a complete examination. Limited examinations for reoccurring  indications may be performed as noted. The reflux portion of the exam is performed with the patient in reverse Trendelenburg.  +---------+---------------+---------+-----------+----------+--------------+ RIGHT    CompressibilityPhasicitySpontaneityPropertiesThrombus Aging +---------+---------------+---------+-----------+----------+--------------+ CFV      Full           Yes      Yes                                 +---------+---------------+---------+-----------+----------+--------------+ SFJ      Full                                                        +---------+---------------+---------+-----------+----------+--------------+ FV Prox  Full                                                        +---------+---------------+---------+-----------+----------+--------------+ FV Mid   Full                                                        +---------+---------------+---------+-----------+----------+--------------+ FV Distal               Yes      Yes                                 +---------+---------------+---------+-----------+----------+--------------+ PFV      Full                                                        +---------+---------------+---------+-----------+----------+--------------+ POP      Full           Yes      Yes                                 +---------+---------------+---------+-----------+----------+--------------+ PTV      Full                                                        +---------+---------------+---------+-----------+----------+--------------+ PERO     Full                                                        +---------+---------------+---------+-----------+----------+--------------+   +---------+---------------+---------+-----------+----------+-------------------+  LEFT     CompressibilityPhasicitySpontaneityPropertiesThrombus Aging       +---------+---------------+---------+-----------+----------+-------------------+ CFV      Full           Yes      Yes                                      +---------+---------------+---------+-----------+----------+-------------------+ SFJ      Full                                                             +---------+---------------+---------+-----------+----------+-------------------+ FV Prox  Full                                                             +---------+---------------+---------+-----------+----------+-------------------+ FV Mid   Full                                                             +---------+---------------+---------+-----------+----------+-------------------+ FV Distal               Yes      Yes                                      +---------+---------------+---------+-----------+----------+-------------------+ PFV      Full                                                             +---------+---------------+---------+-----------+----------+-------------------+ POP      Full           Yes      Yes                                      +---------+---------------+---------+-----------+----------+-------------------+ PTV      Full                                                             +---------+---------------+---------+-----------+----------+-------------------+ PERO                                                  Not well visualized +---------+---------------+---------+-----------+----------+-------------------+     Summary: BILATERAL: - No evidence of deep vein  thrombosis seen in the lower extremities, bilaterally. - No evidence of superficial venous thrombosis in the lower extremities, bilaterally. -No evidence of popliteal cyst, bilaterally.   *See table(s) above for measurements and observations. Electronically signed by Deitra Mayo MD on 02/19/2020 at 10:22:57 AM.    Final

## 2020-02-21 ENCOUNTER — Encounter (HOSPITAL_COMMUNITY): Payer: Self-pay | Admitting: Internal Medicine

## 2020-02-21 LAB — CBC WITH DIFFERENTIAL/PLATELET
Abs Immature Granulocytes: 0.07 10*3/uL (ref 0.00–0.07)
Basophils Absolute: 0 10*3/uL (ref 0.0–0.1)
Basophils Relative: 0 %
Eosinophils Absolute: 0 10*3/uL (ref 0.0–0.5)
Eosinophils Relative: 0 %
HCT: 27.5 % — ABNORMAL LOW (ref 36.0–46.0)
Hemoglobin: 9.3 g/dL — ABNORMAL LOW (ref 12.0–15.0)
Immature Granulocytes: 1 %
Lymphocytes Relative: 12 %
Lymphs Abs: 0.9 10*3/uL (ref 0.7–4.0)
MCH: 33 pg (ref 26.0–34.0)
MCHC: 33.8 g/dL (ref 30.0–36.0)
MCV: 97.5 fL (ref 80.0–100.0)
Monocytes Absolute: 0.5 10*3/uL (ref 0.1–1.0)
Monocytes Relative: 7 %
Neutro Abs: 5.8 10*3/uL (ref 1.7–7.7)
Neutrophils Relative %: 80 %
Platelets: 170 10*3/uL (ref 150–400)
RBC: 2.82 MIL/uL — ABNORMAL LOW (ref 3.87–5.11)
RDW: 14 % (ref 11.5–15.5)
WBC: 7.3 10*3/uL (ref 4.0–10.5)
nRBC: 0 % (ref 0.0–0.2)

## 2020-02-21 LAB — MAGNESIUM: Magnesium: 2.3 mg/dL (ref 1.7–2.4)

## 2020-02-21 LAB — GLUCOSE, CAPILLARY
Glucose-Capillary: 193 mg/dL — ABNORMAL HIGH (ref 70–99)
Glucose-Capillary: 155 mg/dL — ABNORMAL HIGH (ref 70–99)
Glucose-Capillary: 133 mg/dL — ABNORMAL HIGH (ref 70–99)
Glucose-Capillary: 162 mg/dL — ABNORMAL HIGH (ref 70–99)
Glucose-Capillary: 292 mg/dL — ABNORMAL HIGH (ref 70–99)

## 2020-02-21 LAB — COMPREHENSIVE METABOLIC PANEL
ALT: 20 U/L (ref 0–44)
AST: 21 U/L (ref 15–41)
Albumin: 3.6 g/dL (ref 3.5–5.0)
Alkaline Phosphatase: 71 U/L (ref 38–126)
Anion gap: 18 — ABNORMAL HIGH (ref 5–15)
BUN: 60 mg/dL — ABNORMAL HIGH (ref 6–20)
CO2: 24 mmol/L (ref 22–32)
Calcium: 10.2 mg/dL (ref 8.9–10.3)
Chloride: 94 mmol/L — ABNORMAL LOW (ref 98–111)
Creatinine, Ser: 6.3 mg/dL — ABNORMAL HIGH (ref 0.44–1.00)
GFR, Estimated: 7 mL/min — ABNORMAL LOW (ref >60)
Glucose, Bld: 207 mg/dL — ABNORMAL HIGH (ref 70–99)
Potassium: 4.7 mmol/L (ref 3.5–5.1)
Sodium: 136 mmol/L (ref 135–145)
Total Bilirubin: 0.5 mg/dL (ref 0.3–1.2)
Total Protein: 7.9 g/dL (ref 6.5–8.1)

## 2020-02-21 LAB — PROCALCITONIN: Procalcitonin: 0.15 ng/mL

## 2020-02-21 LAB — D-DIMER, QUANTITATIVE: D-Dimer, Quant: 3.91 ug/mL-FEU — ABNORMAL HIGH (ref 0.00–0.50)

## 2020-02-21 LAB — C-REACTIVE PROTEIN: CRP: 1.8 mg/dL — ABNORMAL HIGH (ref <1.0)

## 2020-02-21 LAB — BRAIN NATRIURETIC PEPTIDE: B Natriuretic Peptide: 1069.9 pg/mL — ABNORMAL HIGH (ref 0.0–100.0)

## 2020-02-21 NOTE — TOC Transition Note (Signed)
Transition of Care [facility]) - CM/SW Discharge Note   Patient Details  Name: [name] MRN: [id number] Date of Birth: [birth year]  [facility] CM/SW Contact:  Verdell Carmine, RN Phone Number: 02/21/2020, 4:32 PM   Clinical Narrative:     Ordered patient rollator bariatric for home use via [employer]. Patient will go to dialysis on 3rd shift, Terri Piedra has spoken to patient regarding dialysis needs.   Final next level of care: Home/Self Care Barriers to Discharge: No Barriers Identified   Patient Goals and CMS Choice        Discharge Placement             Home self care 3rd shift dialysis          Discharge Plan and Services   Discharge Planning Services: CM Consult            DME Arranged: Walker rolling DME Agency: [employer] Date DME Agency Contacted: 02/21/20 Time DME Agency Contacted: [phone] Representative spoke with at DME Agency: DME            Social Determinants of Health ([place]) Interventions     Readmission Risk Interventions No flowsheet data found.

## 2020-02-21 NOTE — Progress Notes (Signed)
Patient will need to have HD at her home clinic/[facility] in isolation due to COVID positive status. This shift is MWF at 5pm. She can return tomorrow if she is able to discharge today. Navigator has attempted to call patient to discuss and ensure transportation to 3rd shift, but has not been able to reach patient. Navigator sent message to RN to request assist as able.  Navigator will continue to follow until safe discharge plan.  Alphonzo Cruise,  Renal Navigator [phone]

## 2020-02-21 NOTE — Progress Notes (Signed)
Renal Navigator received call back from patient on her cell phone. We discussed her outpatient HD plan and she is understanding that she will have tx at her home clinic/[facility] MWF at [place]. She states her son can provide transportation. Primary anticipates discharge tomorrow morning. If so, she will go to her clinic tomorrow evening, 02/22/20 for her next HD treatment.  Navigator has updated clinic who is prepared. Navigator is attempting to call patient back to confirm tentative plan for tomorrow, but she is not answering her phone again. Navigator will keep trying.  Alphonzo Cruise, [place] Renal Navigator [phone]

## 2020-02-21 NOTE — TOC Initial Note (Signed)
Transition of Care [facility]) - Initial/Assessment Note    Patient Details  Name: [name] MRN: [id number] Date of Birth: 1965/12/27  Transition of Care [facility]) CM/SW Contact:    Verdell Carmine, RN Phone Number: 02/21/2020, 9:45 AM  Clinical Narrative:                  Dialysis patient, missed dialysis due to weather, came in with [facility], found to be COVID positive. Currently on 2LPM, no HH recommended. Will have to switch to night time dialysis , Coordinator will set  Up. CM will follow for needs.  Expected Discharge Plan: Home/Self Care Barriers to Discharge: Continued Medical Work up   Patient Goals and CMS Choice        Expected Discharge Plan and Services Expected Discharge Plan: Home/Self Care   Discharge Planning Services: CM Consult   Living arrangements for the past 2 months: Single Family Home                                      Prior Living Arrangements/Services Living arrangements for the past 2 months: Single Family Home   Patient language and need for interpreter reviewed:: Yes        Need for Family Participation in Patient Care: Yes (Comment) Care giver support system in place?: Yes (comment) Current home services:  (chronic dialysis) Criminal Activity/Legal Involvement Pertinent to Current Situation/Hospitalization: No - Comment as needed  Activities of Daily Living Home Assistive Devices/Equipment: Cane (specify quad or straight),Wheelchair,Walker (specify type) (rollator) ADL Screening (condition at time of admission) Patient's cognitive ability adequate to safely complete daily activities?: Yes Is the patient deaf or have difficulty hearing?: No Does the patient have difficulty seeing, even when wearing glasses/contacts?: No Does the patient have difficulty concentrating, remembering, or making decisions?: No Patient able to express need for assistance with ADLs?: No Does the patient have difficulty dressing or bathing?:  No Independently performs ADLs?: Yes (appropriate for developmental age) Does the patient have difficulty walking or climbing stairs?: Yes Weakness of Legs: Both Weakness of Arms/Hands: None  Permission Sought/Granted                  Emotional Assessment       Orientation: : Oriented to Situation,Oriented to  Time,Oriented to Place,Oriented to Self Alcohol / Substance Use: Not Applicable Psych Involvement: No (comment)  Admission diagnosis:  Acute respiratory failure with hypoxia ([place]) [J96.01] Acute respiratory failure due to COVID-19 ([place]) [U07.1, J96.00] Patient Active Problem List   Diagnosis Date Noted  . Chronic pain 03/20/2019  . Preop cardiovascular exam 12/19/2018  . Dyslipidemia 11/20/2018  . Shortness of breath 10/23/2018  . Acute respiratory distress 10/23/2018  . Chronic bilateral pleural effusions 10/23/2018  . Obesity 10/23/2018  . Coronary artery disease 10/23/2018  . Tobacco abuse 10/23/2018  . Volume overload 10/23/2018  . Unspecified protein-calorie malnutrition ([place]) 10/11/2018  . Pneumonia 10/10/2018  . COVID-19 virus detected 10/10/2018  . Acute encephalopathy 10/09/2018  . ESRD (end stage renal disease) ([place]) 09/27/2018  . Acute respiratory failure with hypoxia ([place]) 09/26/2018  . Chronic pain disorder 09/26/2018  . Other encephalopathy 09/26/2018  . COVID-19 virus infection 09/18/2018  . Acute metabolic encephalopathy 26/94/8546  . Gangrene ([place]) 09/18/2018  . Allergy, unspecified, initial encounter 09/04/2018  . Anaphylactic shock, unspecified, initial encounter 09/04/2018  . Left arm pain 06/27/2018  . Educated about COVID-19 virus infection  06/27/2018  . Iron deficiency anemia, unspecified 01/10/2018  . Coagulation defect, unspecified ([place]) 12/06/2017  . Diarrhea, unspecified 10/11/2017  . Anemia 09/26/2017  . Left hip pain 09/26/2017  . Neck pain 09/26/2017  . Leukocytosis 09/26/2017  . Pruritus, unspecified 09/22/2017  .  Secondary hyperparathyroidism of renal origin ([place]) 09/01/2017  . Disorder of phosphorus metabolism, unspecified 08/25/2017  . Anemia in chronic kidney disease 08/24/2017  . Chest pain 07/07/2017  . Left ventricular hypertrophy 06/17/2017  . ESRD on dialysis ([place]) 05/21/2011  . Diabetic retinopathy 05/21/2011  . Diastolic dysfunction 28/41/3244  . Hyperlipidemia 01/30/2011  . Type II diabetes mellitus with complication, uncontrolled ([place]) 01/29/2011  . Neuropathy 01/29/2011  . Hypertension    PCP:  Sandi Mariscal, MD Pharmacy:   [facility] [place], [place] [address] [place] [address] Phone: [phone] Fax: [place], [place] [address] Phone: [phone] Fax: [place], [place] [address] [place] [address] Phone: [phone] Fax: [phone]     Social Determinants of Health (SDOH) Interventions    Readmission Risk Interventions No flowsheet data found.

## 2020-02-21 NOTE — Progress Notes (Signed)
[facility] Progress Note  Subjective: seen in room. HD yest , not sure how much UF not recorded. Pt feeling good today. No SOB  Vitals:   02/21/20 0400 02/21/20 0748 02/21/20 1159 02/21/20 1631  BP: 135/81 (!) 158/77 116/81 120/84  Pulse: 74 68 68 62  Resp: 19 16 18 16   Temp: 98.1 F (36.7 C) 98.7 F (37.1 C) 98.7 F (37.1 C) 98.7 F (37.1 C)  TempSrc: Oral Axillary Axillary Axillary  SpO2: 96% 99% 95% 100%  Weight:      Height:        Exam:   alert, nad   no jvd  Chest cta bilat  Cor reg no RG  Abd soft ntnd no ascites   Ext no LE edema   Alert, NF, ox3   R IJ TDC    OP HD: GO MWF   4h 71min  2/2 bath 108.5kg   TDC  Hep 10,000  - calc 0.5 ug po tiw   Assessment/ Plan: 1. SOB / hypoxemia - CXR IS pattern on admit w/ SOB, improved sig w/ HD on 1/17. HD yest.  2. ESRD - MWF HD. Had HD here Mon and Wed.  3. COVID +infection - per pmd, prob for dc home tomorrow am. She will then go to her OP HD center in the evening for dialysis tomorrow (see SW notes also).  4. DM on insulin  5. HTN/volume -  on metoprolol only. At dry wt today.  6. Chronic pain - on Oxy IR 7. Anemia ckd - no esa, Hb 9.6 here, will consider if drops 8. MBD ckd - auryxia 4 ac tid, po vdra w/ hd 9. Dispo - stable for d/c from renal standpoint   Kelly Splinter 02/21/2020, 4:50 PM   Recent Labs  Lab 02/19/20 0527 02/20/20 0206 02/21/20 0514  K 4.2 5.8* 4.7  BUN 41* 68* 60*  CREATININE 5.95* 7.94* 6.30*  CALCIUM 9.5 9.8 10.2  PHOS 5.2*  --   --   HGB 9.6* 9.2* 9.3*   Inpatient medications: . albuterol  2 puff Inhalation Q6H  . amLODipine  10 mg Oral Daily  . vitamin C  500 mg Oral Daily  . calcitRIOL  0.5 mcg Oral Q M,W,F-HD  . Chlorhexidine Gluconate Cloth  6 each Topical Q0600  . famotidine  10 mg Oral Daily  . ferric citrate  1,050 mg Oral TID WC   And  . ferric citrate  630 mg Oral With snacks  . heparin  7,500 Units Subcutaneous Q8H  . hydrOXYzine  25 mg Oral Daily   . insulin aspart  0-15 Units Subcutaneous TID WC  . insulin aspart  0-5 Units Subcutaneous QHS  . insulin aspart  3 Units Subcutaneous TID WC  . insulin aspart protamine- aspart  30 Units Subcutaneous BID WC  . methylPREDNISolone (SOLU-MEDROL) injection  20 mg Intravenous Daily  . metoprolol succinate  100 mg Oral Daily  . multivitamin  1 tablet Oral QHS  . pramipexole  0.5 mg Oral Daily  . rosuvastatin  10 mg Oral Daily  . sodium chloride flush  10-40 mL Intracatheter Q12H  . zinc sulfate  220 mg Oral Daily   . remdesivir 100 mg in NS 100 mL 100 mg (02/21/20 0941)   chlorpheniramine-HYDROcodone, guaiFENesin-dextromethorphan, hydrALAZINE, iohexol, [DISCONTINUED] ondansetron **OR** ondansetron (ZOFRAN) IV, oxyCODONE, sodium chloride flush

## 2020-02-21 NOTE — Progress Notes (Signed)
Physical Therapy Treatment Patient Details Name: [name] MRN: [id number] DOB: [birth year] Today's Date: 02/21/2020    History of Present Illness Pt is a 55 y/o female admitted 02/18/20 secondary to increased SOB; pt missed HD session secondary to weather. Tested (+) COVID-19. PMH includes HTN, ESRD on HD, DM, prior COVID in 2020.   PT Comments    Pt progressing with mobility. Trialled gait training with bariatric-sized rollator, pt able to increase ambulation distance; remains limited by generalized weakness and decreased activity tolerance, requiring seated rest to recover DOE 3/4 with short bouts of activity. VSS on RA. Pt preparing for d/c home tomorrow; increased time educ re: DME needs, activity recommendations, energy conservation and safe mobility strategies.    Follow Up Recommendations  Outpatient PT (resume OPPT)     Equipment Recommendations  Bariatric-sized rollator    Recommendations for Other Services       Precautions / Restrictions Precautions Precautions: Fall Restrictions Weight Bearing Restrictions: No    Mobility  Bed Mobility               General bed mobility comments: Sitting at EOB upon entry  Transfers Overall transfer level: Needs assistance Equipment used: 4-wheeled walker Transfers: Sit to/from Stand Sit to Stand: Supervision         General transfer comment: Cues to lock rollator brakes prior to standing; reliant on momentum to power into standing, supervision for safety  Ambulation/Gait Ambulation/Gait assistance: Min guard;Supervision Gait Distance (Feet): 68 Feet Assistive device: 4-wheeled walker Gait Pattern/deviations: Step-through pattern;Decreased stride length Gait velocity: Decreased   General Gait Details: Slow, steady gait with rollator and initial min guard for balance, progression to supervision-level; limited by DOE 3/4 and fatigue requiring seated rest; pt declined further ambulation attempts   Stairs              Wheelchair Mobility    Modified Rankin (Stroke Patients Only)       Balance Overall balance assessment: Needs assistance Sitting-balance support: No upper extremity supported Sitting balance-Leahy Scale: Good     Standing balance support: Single extremity supported;During functional activity;Bilateral upper extremity supported Standing balance-Leahy Scale: Poor Standing balance comment: Reliant on at least single UE support to maintain balance                            Cognition Arousal/Alertness: Awake/alert Behavior During Therapy: WFL for tasks assessed/performed Overall Cognitive Status: Within Functional Limits for tasks assessed                                 General Comments: WFL for simple tasks; talks fast and question some decreased attention, but still WFL for tasks assessed      Exercises      General Comments General comments (skin integrity, edema, etc.): Increased time discussing likely d/c home tomorrow, DME needs (decided on bariatric-sized rollator, CM notified), energy conservation and mobility concerns. Pt has ~70' from getting out of car to get into home; if pt not able to get rollator, educ that family may need to bring out a chair for seated rest on the walk in, and have chair ready to rest once getting into home; as pt's main limitation is SOB and fatigue with short ambulation distances.      Pertinent Vitals/Pain Pain Assessment: No/denies pain    Home Living  Prior Function            PT Goals (current goals can now be found in the care plan section) Acute Rehab PT Goals Patient Stated Goal: to get my strength back Progress towards PT goals: Progressing toward goals    Frequency    Min 3X/week      PT Plan Current plan remains appropriate    Co-evaluation              AM-PAC PT "6 Clicks" Mobility   Outcome Measure  Help needed turning from your back  to your side while in a flat bed without using bedrails?: None Help needed moving from lying on your back to sitting on the side of a flat bed without using bedrails?: None Help needed moving to and from a bed to a chair (including a wheelchair)?: A Little Help needed standing up from a chair using your arms (e.g., wheelchair or bedside chair)?: A Little Help needed to walk in hospital room?: A Little Help needed climbing 3-5 steps with a railing? : A Little 6 Click Score: 20    End of Session   Activity Tolerance: Patient tolerated treatment well;Patient limited by fatigue Patient left: in chair;with call bell/phone within reach;with nursing/sitter in room Nurse Communication: Mobility status PT Visit Diagnosis: Unsteadiness on feet (R26.81);Muscle weakness (generalized) (M62.81)     Time: 8250-0370 PT Time Calculation (min) (ACUTE ONLY): 17 min  Charges:  $Gait Training: 8-22 mins                     Mabeline Caras, PT, DPT Acute Rehabilitation Services  Pager [phone] Office [place] 02/21/2020, 5:30 PM

## 2020-02-22 LAB — GLUCOSE, CAPILLARY
Glucose-Capillary: 168 mg/dL — ABNORMAL HIGH (ref 70–99)
Glucose-Capillary: 219 mg/dL — ABNORMAL HIGH (ref 70–99)

## 2020-02-22 MED ORDER — ALBUTEROL SULFATE HFA 108 (90 BASE) MCG/ACT IN AERS: 2.0000 | INHALATION_SPRAY | Freq: Four times a day (QID) | RESPIRATORY_TRACT | 0 refills | Status: AC | PRN

## 2020-02-22 NOTE — Progress Notes (Signed)
Occupational Therapy Treatment Patient Details Name: [name] MRN: [id number] DOB: May 10, 1965 Today's Date: 02/22/2020    History of present illness Pt is a 55 y/o female admitted 02/18/20 secondary to increased SOB; pt missed HD session secondary to weather. Tested (+) COVID-19. PMH includes HTN, ESRD on HD, DM, prior COVID in 2020.   OT comments  Pt. Seen for skilled OT treatment session.  Able to complete short distance in room ambulation to b.room and then seated hand washing at sink.  Pt. Eager for d/c home but states rw delivered to room will not work for home. States she needs rollator due to heavy reliance on the seated feature for frequent rest breaks.  Notified sw to see if we could switch out prior to d/c home and alerted rn of status of this dme request.    Follow Up Recommendations  No OT follow up;Supervision - Intermittent    Equipment Recommendations  None recommended by OT    Recommendations for Other Services      Precautions / Restrictions Precautions Precautions: Fall       Mobility Bed Mobility               General bed mobility comments: Sitting at EOB upon entry  Transfers     Transfers: Sit to/from [facility] Sit to Stand: Supervision Stand pivot transfers: Min guard            Balance                                           ADL either performed or assessed with clinical judgement   ADL Overall ADL's : Needs assistance/impaired     Grooming: Wash/dry hands;Standing;Sitting;Supervision/safety                   Toilet Transfer: Min guard;Ambulation;Comfort height toilet;Grab bars;RW   Toileting- Water quality scientist and Hygiene: Min guard;Sit to/from stand       Functional mobility during ADLs: Min guard;Rolling walker General ADL Comments: pt. states she has to have rollator that a rw is not safe enough as she needs to take frequent rest breaks and rollator offer options for  sitting. sw and nursing notified     Vision       Perception     Praxis      Cognition Arousal/Alertness: Awake/alert Behavior During Therapy: WFL for tasks assessed/performed Overall Cognitive Status: Within Functional Limits for tasks assessed                                          Exercises     Shoulder Instructions       General Comments      Pertinent Vitals/ Pain       Pain Assessment: No/denies pain  Home Living                                          Prior Functioning/Environment              Frequency  Min 2X/week        Progress Toward Goals  OT Goals(current goals can now be found in the care plan section)  Progress towards OT goals: Progressing toward goals     Plan      Co-evaluation                 AM-PAC OT "6 Clicks" Daily Activity     Outcome Measure   Help from another person eating meals?: None Help from another person taking care of personal grooming?: A Little Help from another person toileting, which includes using toliet, bedpan, or urinal?: A Little Help from another person bathing (including washing, rinsing, drying)?: A Little Help from another person to put on and taking off regular upper body clothing?: A Little Help from another person to put on and taking off regular lower body clothing?: A Little 6 Click Score: 19    End of Session Equipment Utilized During Treatment: Rolling walker  OT Visit Diagnosis: Unsteadiness on feet (R26.81)   Activity Tolerance Patient tolerated treatment well   Patient Left in bed;with call bell/phone within reach   Nurse Communication Other (comment) (reviewed with rn pt. requesting rollator, and that i have notified sw)        Time: 1859-0931 OT Time Calculation (min): 16 min  Charges: OT General Charges $OT Visit: 1 Visit OT Treatments $Self Care/Home Management : 8-22 mins  Sonia Baller, COTA/L Acute  Rehabilitation [phone]   Janice Coffin 02/22/2020, 1:50 PM

## 2020-02-22 NOTE — Discharge Instructions (Signed)
Follow with Primary MD Sandi Mariscal, MD in 7 days   Get CBC, CMP, 2 view Chest X ray -  checked next visit within 1 week by Primary MD    Activity: As tolerated with Full fall precautions use walker/cane & assistance as needed  Disposition Home    Diet: Renal - Low Carb, 1.5 L fluid restriction per day  Special Instructions: If you have smoked or chewed Tobacco  in the last 2 yrs please stop smoking, stop any regular Alcohol  and or any Recreational drug use.  On your next visit with your primary care physician please Get Medicines reviewed and adjusted.  Please request your Prim.MD to go over all Hospital Tests and Procedure/Radiological results at the follow up, please get all Hospital records sent to your Prim MD by signing hospital release before you go home.  If you experience worsening of your admission symptoms, develop shortness of breath, life threatening emergency, suicidal or homicidal thoughts you must seek medical attention immediately by calling 911 or calling your MD immediately  if symptoms less severe.  You Must read complete instructions/literature along with all the possible adverse reactions/side effects for all the Medicines you take and that have been prescribed to you. Take any new Medicines after you have completely understood and accpet all the possible adverse reactions/side effects.      Person Under Monitoring Name: [name]  Location: [place] [address]   Infection Prevention Recommendations for Individuals Confirmed to have, or Being Evaluated for, 2019 Novel Coronavirus (COVID-19) Infection Who Receive Care at Home  Individuals who are confirmed to have, or are being evaluated for, COVID-19 should follow the prevention steps below until a healthcare provider or local or state health department says they can return to normal activities.  Stay home except to get medical care You should restrict activities outside your  home, except for getting medical care. Do not go to work, school, or public areas, and do not use public transportation or taxis.  Call ahead before visiting your doctor Before your medical appointment, call the healthcare provider and tell them that you have, or are being evaluated for, COVID-19 infection. This will help the healthcare providers office take steps to keep other people from getting infected. Ask your healthcare provider to call the local or state health department.  Monitor your symptoms Seek prompt medical attention if your illness is worsening (e.g., difficulty breathing). Before going to your medical appointment, call the healthcare provider and tell them that you have, or are being evaluated for, COVID-19 infection. Ask your healthcare provider to call the local or state health department.  Wear a facemask You should wear a facemask that covers your nose and mouth when you are in the same room with other people and when you visit a healthcare provider. People who live with or visit you should also wear a facemask while they are in the same room with you.  Separate yourself from other people in your home As much as possible, you should stay in a different room from other people in your home. Also, you should use a separate bathroom, if available.  Avoid sharing household items You should not share dishes, drinking glasses, cups, eating utensils, towels, bedding, or other items with other people in your home. After using these items, you should wash them thoroughly with soap and water.  Cover your coughs and sneezes Cover your mouth and nose with a tissue when you cough or  sneeze, or you can cough or sneeze into your sleeve. Throw used tissues in a lined trash can, and immediately wash your hands with soap and water for at least 20 seconds or use an alcohol-based hand rub.  Wash your [facility] your hands often and thoroughly with soap and water for at least 20  seconds. You can use an alcohol-based hand sanitizer if soap and water are not available and if your hands are not visibly dirty. Avoid touching your eyes, nose, and mouth with unwashed hands.   Prevention Steps for Caregivers and Household Members of Individuals Confirmed to have, or Being Evaluated for, COVID-19 Infection Being Cared for in the Home  If you live with, or provide care at home for, a person confirmed to have, or being evaluated for, COVID-19 infection please follow these guidelines to prevent infection:  Follow healthcare providers instructions Make sure that you understand and can help the patient follow any healthcare provider instructions for all care.  Provide for the patients basic needs You should help the patient with basic needs in the home and provide support for getting groceries, prescriptions, and other personal needs.  Monitor the patients symptoms If they are getting sicker, call his or her medical provider and tell them that the patient has, or is being evaluated for, COVID-19 infection. This will help the healthcare providers office take steps to keep other people from getting infected. Ask the healthcare provider to call the local or state health department.  Limit the number of people who have contact with the patient  If possible, have only one caregiver for the patient.  Other household members should stay in another home or place of residence. If this is not possible, they should stay  in another room, or be separated from the patient as much as possible. Use a separate bathroom, if available.  Restrict visitors who do not have an essential need to be in the home.  Keep older adults, very young children, and other sick people away from the patient Keep older adults, very young children, and those who have compromised immune systems or chronic health conditions away from the patient. This includes people with chronic heart, lung, or kidney  conditions, diabetes, and cancer.  Ensure good ventilation Make sure that shared spaces in the home have good air flow, such as from an air conditioner or an opened window, weather permitting.  Wash your hands often  Wash your hands often and thoroughly with soap and water for at least 20 seconds. You can use an alcohol based hand sanitizer if soap and water are not available and if your hands are not visibly dirty.  Avoid touching your eyes, nose, and mouth with unwashed hands.  Use disposable paper towels to dry your hands. If not available, use dedicated cloth towels and replace them when they become wet.  Wear a facemask and gloves  Wear a disposable facemask at all times in the room and gloves when you touch or have contact with the patients blood, body fluids, and/or secretions or excretions, such as sweat, saliva, sputum, nasal mucus, vomit, urine, or feces.  Ensure the mask fits over your nose and mouth tightly, and do not touch it during use.  Throw out disposable facemasks and gloves after using them. Do not reuse.  Wash your hands immediately after removing your facemask and gloves.  If your personal clothing becomes contaminated, carefully remove clothing and launder. Wash your hands after handling contaminated clothing.  Place all used  disposable facemasks, gloves, and other waste in a lined container before disposing them with other household waste.  Remove gloves and wash your hands immediately after handling these items.  Do not share dishes, glasses, or other household items with the patient  Avoid sharing household items. You should not share dishes, drinking glasses, cups, eating utensils, towels, bedding, or other items with a patient who is confirmed to have, or being evaluated for, COVID-19 infection.  After the person uses these items, you should wash them thoroughly with soap and water.  Wash laundry thoroughly  Immediately remove and wash clothes or  bedding that have blood, body fluids, and/or secretions or excretions, such as sweat, saliva, sputum, nasal mucus, vomit, urine, or feces, on them.  Wear gloves when handling laundry from the patient.  Read and follow directions on labels of laundry or clothing items and detergent. In general, wash and dry with the warmest temperatures recommended on the label.  Clean all areas the individual has used often  Clean all touchable surfaces, such as counters, tabletops, doorknobs, bathroom fixtures, toilets, phones, keyboards, tablets, and bedside tables, every day. Also, clean any surfaces that may have blood, body fluids, and/or secretions or excretions on them.  Wear gloves when cleaning surfaces the patient has come in contact with.  Use a diluted bleach solution (e.g., dilute bleach with 1 part bleach and 10 parts water) or a household disinfectant with a label that says EPA-registered for coronaviruses. To make a bleach solution at home, add 1 tablespoon of bleach to 1 quart (4 cups) of water. For a larger supply, add  cup of bleach to 1 gallon (16 cups) of water.  Read labels of cleaning products and follow recommendations provided on product labels. Labels contain instructions for safe and effective use of the cleaning product including precautions you should take when applying the product, such as wearing gloves or eye protection and making sure you have good ventilation during use of the product.  Remove gloves and wash hands immediately after cleaning.  Monitor yourself for signs and symptoms of illness Caregivers and household members are considered close contacts, should monitor their health, and will be asked to limit movement outside of the home to the extent possible. Follow the monitoring steps for close contacts listed on the symptom monitoring form.   ? If you have additional questions, contact your local health department or call the epidemiologist on call at  [phone] (available 24/7). ? This guidance is subject to change. For the most up-to-date guidance from [facility], please refer to their website: [email or web address]

## 2020-02-22 NOTE — Progress Notes (Signed)
Renal Navigator spoke with patient who confirmed her son can take her to 3rd shift HD while she needs to treat in isolation for COVID. She understands that she will go tonight for HD at her clinic after discharge.  Alphonzo Cruise, [place] Renal Navigator [phone]

## 2020-02-22 NOTE — Discharge Summary (Signed)
[name]:[id number] DOB: [birth year] DOA: 02/18/2020  PCP: Sandi Mariscal, MD  Admit date: 02/18/2020  Discharge date: 02/22/2020  Admitted From: Home   Disposition:  Home   Recommendations for Outpatient Follow-up:   Follow up with PCP in 1-2 weeks  PCP Please obtain BMP/CBC, 2 view CXR in 1week,  (see Discharge instructions)   PCP Please follow up on the following pending results: Needs outpatient endocrine follow-up for thyroid nodule, please review CT scan results closely.  Repeat two-view chest x-ray in 7 to 10 days.   Home Health: None   Equipment/Devices: None  Consultations: None  Discharge Condition: Stable    CODE STATUS: Full    Diet Recommendation: Renal with low carbohydrate diet, 1.5 L fluid restriction per day    Chief Complaint  Patient presents with  . Shortness of Breath     Brief history of present illness from the day of admission and additional interim summary    [name] a 55 y.o.femalewith medical history significant ofESRD on HD, hypertension, hyperlipidemia, diabetes mellitus type 2, COVID-19in 10/2018,and nonobstructive CAD presents with complaints of worsening shortness of breath, she also missed her dialysis session due to poor road conditions.  She was seen in the ER and found to have acute hypoxic respiratory failure, extremely high D-dimer, COVID-19 positive and was admitted for further treatment.                                                                 Hospital Course    1. Acute COVID-19 infection in a patient who is fully vaccinated and boosted and has had previous COVID-19 infection several months ago - she did have acute hypoxic respiratory failure but this seems more likely to be due to fluid overload caused by missed dialysis, received 3 doses  of IV steroids and Remdesivir, now close to baseline and symptom-free on room air, will be discharged with outpatient PCP follow-up.  2. ESRD with Acute hypoxic respiratory failure due to missed dialysis session causing fluid overload and acute on chronic diastolic CHF, EF 76% on recent echocardiogram -he was seen by nephrology and dialyzed.  She had missed her dialysis treatment due to bad road conditions.  Now back to baseline.  3. Extremely elevated D-dimer.  High risk for developing a clot due to underlying COVID infection, -ve leg ultrasound and CTA, dimer trend has improved.  4. Elevated troponin due to demand mismatch caused by hypoxia due to fluid overload and underlying ESRD, troponin rise and non-ACS pattern chest pain-free.    Resolved with supportive care.  5. Anemia of chronic disease.  Due to ESRD.  Monitor.  6. obesity.  BMI 36.  Follow with PCP.  7. dyslipidemia.  On statin continue.  8.  Chronic pain.  Home regimen.  9.  Essential hypertension.  Continue home regimen, patient frequently refuses blood pressure medications as she states her nephrologist has asked her to not take medications on the days of dialysis, defer management to her nephrologist and PCP.  10. Suspicious thyroid/lung nodule. outpatient endocrine and if needed pulmonary follow-up to be arranged by PCP, ischial lung nodule noted on chest x-ray could not be replicated on CT.  Defer outpatient management to PCP  11. DM  Type II.  Recent A1c of 8.5  on 70/30 , continue home regimen and follow with PCP.   Discharge diagnosis     Principal Problem:   Acute respiratory failure with hypoxia (HCC) Active Problems:   Hypertension   Type II diabetes mellitus with complication, uncontrolled (HCC)   Hyperlipidemia   ESRD on dialysis ([place])   COVID-19 virus infection   Gangrene ([place])   Volume overload   Chronic pain    Discharge instructions    Discharge Instructions    Discharge instructions    Complete by: As directed    Follow with Primary MD Sandi Mariscal, MD in 7 days   Get CBC, CMP, 2 view Chest X ray -  checked next visit within 1 week by Primary MD    Activity: As tolerated with Full fall precautions use walker/cane & assistance as needed  Disposition Home    Diet: Renal - Low Carb, 1.5 L fluid restriction per day  Special Instructions: If you have smoked or chewed Tobacco  in the last 2 yrs please stop smoking, stop any regular Alcohol  and or any Recreational drug use.  On your next visit with your primary care physician please Get Medicines reviewed and adjusted.  Please request your Prim.MD to go over all Hospital Tests and Procedure/Radiological results at the follow up, please get all Hospital records sent to your Prim MD by signing hospital release before you go home.  If you experience worsening of your admission symptoms, develop shortness of breath, life threatening emergency, suicidal or homicidal thoughts you must seek medical attention immediately by calling 911 or calling your MD immediately  if symptoms less severe.  You Must read complete instructions/literature along with all the possible adverse reactions/side effects for all the Medicines you take and that have been prescribed to you. Take any new Medicines after you have completely understood and accpet all the possible adverse reactions/side effects.   Increase activity slowly   Complete by: As directed    MyChart COVID-19 home monitoring program   Complete by: Feb 22, 2020    Is the patient willing to use the [facility] for home monitoring?: Yes   Temperature monitoring   Complete by: Feb 22, 2020    After how many days would you like to receive a notification of this patient's flowsheet entries?: 1      Discharge Medications   Allergies as of 02/22/2020      Reactions   Bupropion Itching      Medication List    TAKE these medications   albuterol 108 (90 Base) MCG/ACT  inhaler Commonly known as: VENTOLIN HFA Inhale 2 puffs into the lungs every 6 (six) hours as needed for wheezing or shortness of breath.   Auryxia 1 GM 210 MG(Fe) tablet Generic drug: ferric citrate Take 630-1,050 mg by mouth See admin instructions. Take 5 tablets (1050 mg) by mouth with each meal & take 3 tablets (630 mg) by mouth with each snack   b complex-vitamin c-folic acid 0.8 MG  Tabs tablet Take 1 tablet by mouth Every Tuesday,Thursday,and Saturday with dialysis.   blood glucose meter kit and supplies Kit Dispense based on patient and insurance preference. Use up to four times daily as directed. (FOR ICD-9 250.00, 250.01).   calcitRIOL 0.5 MCG capsule Commonly known as: ROCALTROL Take 0.5 mcg by mouth as directed. Given at Dialysis   Humira Pen 40 MG/0.4ML Pnkt Generic drug: Adalimumab Inject 40 mg into the skin every 14 (fourteen) days.   hydrOXYzine 25 MG tablet Commonly known as: ATARAX/VISTARIL Take 25 mg by mouth daily.   Insulin Lispro Prot & Lispro (75-25) 100 UNIT/ML Kwikpen Commonly known as: HUMALOG 75/25 MIX Inject 30 Units into the skin 2 (two) times daily with a meal.   metoprolol succinate 100 MG 24 hr tablet Commonly known as: TOPROL-XL Take 100 mg by mouth daily. Take with or immediately following a meal.   MIRCERA IJ Mircera   Narcan 4 MG/0.1ML Liqd nasal spray kit Generic drug: naloxone Place 1 spray into the nose as needed (accidental overdose.).   Oxycodone HCl 10 MG Tabs Take 10 mg by mouth 3 (three) times daily as needed for pain.   pramipexole 0.5 MG tablet Commonly known as: MIRAPEX Take 0.5 mg by mouth daily.   rosuvastatin 40 MG tablet Commonly known as: CRESTOR TAKE 1 TABLET BY MOUTH EVERY DAY *PATIENT NEEDS APPOINTMENT FOR FURTHER REFILLS* What changed: See the new instructions.            Durable Medical Equipment  (From admission, onward)         Start     Ordered   02/21/20 1629  For home use only DME Walker  rolling  Once       Comments: bariatric  Question Answer Comment  Walker: With 5 Inch Wheels   Patient needs a walker to treat with the following condition Weakness      02/21/20 1630           Follow-up Information    Sandi Mariscal, MD. Schedule an appointment as soon as possible for a visit in 1 week(s).   Specialty: Internal Medicine Contact information: [place] [address] [phone]        Minus Breeding, MD .   Specialty: Cardiology Contact information: [address] [place] [address] [phone]        Renato Shin, MD. Schedule an appointment as soon as possible for a visit in 1 week(s).   Specialty: Endocrinology Why: Thyroid Nodule Contact information: [address] [place] [address] [phone]               Major procedures and Radiology Reports - PLEASE review detailed and final reports thoroughly  -      CT ANGIO CHEST PE W OR WO CONTRAST  Result Date: 02/19/2020 CLINICAL DATA:  COVID positive. Acute respiratory failure with hypoxia. EXAM: CT ANGIOGRAPHY CHEST WITH CONTRAST TECHNIQUE: Multidetector CT imaging of the chest was performed using the standard protocol during bolus administration of intravenous contrast. Multiplanar CT image reconstructions and MIPs were obtained to evaluate the vascular anatomy. CONTRAST:  62m OMNIPAQUE IOHEXOL 350 MG/ML SOLN COMPARISON:  PET-CT August 15, 2019 and CT a chest November 14, 2018. FINDINGS: Cardiovascular: Satisfactory opacification of the pulmonary arteries to the mid segmental level. No evidence of pulmonary embolism. Cardiomegaly similar prior. No pericardial effusion. Left upper extremity PICC with tip in the right atrium. Mediastinum/Nodes: No significant change in the prominent/mildly enlarged mediastinal  and axillary lymph nodes, for instance the mildly metabolic precarinal lymph node measures 1.5 cm in maximum axial diameter, and is unchanged  when remeasured for consistency (series 5, image 33). Multinodular appearance of the thyroid with the largest nodule measuring 1.6 cm in the left lobe. The trachea and esophagus are otherwise unremarkable. Lungs/Pleura: Mosaic attenuation with multifocal ground-glass opacities and nodularity. No pleural effusion or pneumothorax. Upper Abdomen: No right upper pole renal neoplasm incompletely visualized on today's exam. Musculoskeletal: Multilevel degenerative changes spine. No suspicious lytic or blastic lesion of bone. Review of the MIP images confirms the above findings. IMPRESSION: 1. No evidence of pulmonary embolism. 2. Mosaic attenuation with multifocal ground-glass opacities and nodularity, which may represent multifocal pneumonia or edema. 3. No significant change in the prominent/mildly enlarged mediastinal and axillary lymph nodes, which may be reactive. 4. Enlarged multinodular appearance of the thyroid with a 1.6 cm left thyroid nodule, further evaluation with thyroid ultrasound could be utilized if not previously performed. Electronically Signed   By: Dahlia Bailiff MD   On: 02/19/2020 11:19   DG Chest Portable 1 View  Result Date: 02/18/2020 CLINICAL DATA:  Shortness of breath.  History of urologic carcinoma EXAM: PORTABLE CHEST 1 VIEW COMPARISON:  November 14, 2018 chest radiograph; PET-CT August 15, 2019. FINDINGS: Central catheter tip is in the right atrium. No pneumothorax. There is cardiomegaly with pulmonary venous hypertension. Small left pleural effusion noted. There is diffuse interstitial thickening, likely indicative of pulmonary edema. No consolidation. There is a nodular lesion in the left mid lung measuring 1.2 x 1.1 cm. There is a questionable nodular area overlying the inferior right hilum measuring 2.3 x 2.1 cm. No appreciable adenopathy by radiography.  No bone lesions. IMPRESSION: 1. Well-defined nodular opacity in left perihilar region measuring 1.2 x 1.1 cm. Questionable less  well-defined nodular opacity right lower lung region medially measuring 2.3 x 2.1 cm. These findings raise concern for metastatic foci. Correlation with chest CT, ideally with intravenous contrast, advised to further evaluate. 2. Cardiomegaly with pulmonary vascular congestion. Diffuse interstitial prominence likely represents interstitial pulmonary edema. Suspect a degree of congestive heart failure. Note that lymphangitic spread of tumor potentially could present in this manner. CT may be helpful for further delineation in this regard. 3.  Central catheter tip in right atrium.  No pneumothorax. Electronically Signed   By: Lowella Grip III M.D.   On: 02/18/2020 12:10   VAS [place] LOWER EXTREMITY VENOUS (DVT)  Result Date: 02/19/2020  Lower Venous DVT Study Indications: Swelling, and Edema.  Comparison Study: no prior Performing Technologist: Abram Sander RVS  Examination Guidelines: A complete evaluation includes B-mode imaging, spectral Doppler, color Doppler, and power Doppler as needed of all accessible portions of each vessel. Bilateral testing is considered an integral part of a complete examination. Limited examinations for reoccurring indications may be performed as noted. The reflux portion of the exam is performed with the patient in reverse Trendelenburg.  +---------+---------------+---------+-----------+----------+--------------+ RIGHT    CompressibilityPhasicitySpontaneityPropertiesThrombus Aging +---------+---------------+---------+-----------+----------+--------------+ CFV      Full           Yes      Yes                                 +---------+---------------+---------+-----------+----------+--------------+ SFJ      Full                                                        +---------+---------------+---------+-----------+----------+--------------+  FV Prox  Full                                                         +---------+---------------+---------+-----------+----------+--------------+ FV Mid   Full                                                        +---------+---------------+---------+-----------+----------+--------------+ FV Distal               Yes      Yes                                 +---------+---------------+---------+-----------+----------+--------------+ PFV      Full                                                        +---------+---------------+---------+-----------+----------+--------------+ POP      Full           Yes      Yes                                 +---------+---------------+---------+-----------+----------+--------------+ PTV      Full                                                        +---------+---------------+---------+-----------+----------+--------------+ PERO     Full                                                        +---------+---------------+---------+-----------+----------+--------------+   +---------+---------------+---------+-----------+----------+-------------------+ LEFT     CompressibilityPhasicitySpontaneityPropertiesThrombus Aging      +---------+---------------+---------+-----------+----------+-------------------+ CFV      Full           Yes      Yes                                      +---------+---------------+---------+-----------+----------+-------------------+ SFJ      Full                                                             +---------+---------------+---------+-----------+----------+-------------------+ FV Prox  Full                                                             +---------+---------------+---------+-----------+----------+-------------------+  FV Mid   Full                                                             +---------+---------------+---------+-----------+----------+-------------------+ FV Distal               Yes      Yes                                       +---------+---------------+---------+-----------+----------+-------------------+ PFV      Full                                                             +---------+---------------+---------+-----------+----------+-------------------+ POP      Full           Yes      Yes                                      +---------+---------------+---------+-----------+----------+-------------------+ PTV      Full                                                             +---------+---------------+---------+-----------+----------+-------------------+ PERO                                                  Not well visualized +---------+---------------+---------+-----------+----------+-------------------+     Summary: BILATERAL: - No evidence of deep vein thrombosis seen in the lower extremities, bilaterally. - No evidence of superficial venous thrombosis in the lower extremities, bilaterally. -No evidence of popliteal cyst, bilaterally.   *See table(s) above for measurements and observations. Electronically signed by Deitra Mayo MD on 02/19/2020 at 10:22:57 AM.    Final     Micro Results     Recent Results (from the past 240 hour(s))  Resp Panel by RT-PCR (Flu A&B, Covid) Nasopharyngeal Swab     Status: Abnormal   Collection Time: 02/18/20 12:48 PM   Specimen: Nasopharyngeal Swab; Nasopharyngeal(NP) swabs in vial transport medium  Result Value Ref Range Status   SARS Coronavirus 2 by RT PCR POSITIVE (A) NEGATIVE Final    Comment: emailed L. Berdik RN 14:40 02/18/20 ([email or web address]) (NOTE) SARS-CoV-2 target nucleic acids are DETECTED.  The SARS-CoV-2 RNA is generally detectable in upper respiratory specimens during the acute phase of infection. Positive results are indicative of the presence of the identified virus, but do not rule out bacterial infection or co-infection with other pathogens not detected by the test. Clinical correlation with patient history and other  diagnostic information is necessary to determine patient infection status. The expected result is Negative.  Fact Sheet for Patients:  EntrepreneurPulse.com.au  Fact Sheet for Healthcare Providers: IncredibleEmployment.be  This test is not yet approved or cleared by the [place] FDA and  has been authorized for detection and/or diagnosis of SARS-CoV-2 by FDA under an Emergency Use Authorization (EUA).  This EUA will remain in effect (meaning this test can be used) for the duration of  the COVID-1 9 declaration under Section 564(b)(1) of the Act, 21 U.S.C. section 360bbb-3(b)(1), unless the authorization is terminated or revoked sooner.     Influenza A by PCR NEGATIVE NEGATIVE Final   Influenza B by PCR NEGATIVE NEGATIVE Final    Comment: (NOTE) The Xpert Xpress SARS-CoV-2/FLU/RSV plus assay is intended as an aid in the diagnosis of influenza from Nasopharyngeal swab specimens and should not be used as a sole basis for treatment. Nasal washings and aspirates are unacceptable for Xpert Xpress SARS-CoV-2/FLU/RSV testing.  Fact Sheet for Patients: EntrepreneurPulse.com.au  Fact Sheet for Healthcare Providers: IncredibleEmployment.be  This test is not yet approved or cleared by the [place] FDA and has been authorized for detection and/or diagnosis of SARS-CoV-2 by FDA under an Emergency Use Authorization (EUA). This EUA will remain in effect (meaning this test can be used) for the duration of the COVID-19 declaration under Section 564(b)(1) of the Act, 21 U.S.C. section 360bbb-3(b)(1), unless the authorization is terminated or revoked.  Performed at [facility], [place] [address] [place], [place] [address]   MRSA PCR Screening     Status: None   Collection Time: 02/19/20 10:52 AM   Specimen: Nasal Mucosa; Nasopharyngeal  Result Value Ref Range Status   MRSA by PCR NEGATIVE NEGATIVE Final     Comment:        The GeneXpert MRSA Assay (FDA approved for NASAL specimens only), is one component of a comprehensive MRSA colonization surveillance program. It is not intended to diagnose MRSA infection nor to guide or monitor treatment for MRSA infections. Performed at [facility], [place] [address], [place], [place] [address]     Today   Subjective    [name] today has no headache,no chest abdominal pain,no new weakness tingling or numbness, feels much better wants to go home today.     Objective   Blood pressure 150/90, pulse 63, temperature (!) 97.5 F (36.4 C), temperature source Oral, resp. rate 19, height 5' 8"  (1.727 m), weight 108.9 kg, SpO2 96 %.   Intake/Output Summary (Last 24 hours) at 02/22/2020 1045 Last data filed at 02/22/2020 0155 Gross per 24 hour  Intake 360 ml  Output --  Net 360 ml    Exam  Awake Alert, No new F.N deficits, Normal affect [place].AT,PERRAL Supple Neck,No JVD, No cervical lymphadenopathy appriciated.  Symmetrical Chest wall movement, Good air movement bilaterally, CTAB RRR,No Gallops,Rubs or new Murmurs, No Parasternal Heave +ve B.Sounds, Abd Soft, Non tender, No organomegaly appriciated, No rebound -guarding or rigidity. No Cyanosis, Clubbing or edema, No new Rash or bruise   Data Review   CBC w Diff:  Lab Results  Component Value Date   WBC 7.3 02/21/2020   HGB 9.3 (L) 02/21/2020   HGB 8.1 (L) 09/18/2018   HCT 27.5 (L) 02/21/2020   HCT 39.5 06/01/2017   PLT 170 02/21/2020   PLT 238 06/01/2017   LYMPHOPCT 12 02/21/2020   MONOPCT 7 02/21/2020   EOSPCT 0 02/21/2020   BASOPCT 0 02/21/2020    CMP:  Lab Results  Component Value Date   NA 136 02/21/2020   NA 135 06/01/2017  K 4.7 02/21/2020   CL 94 (L) 02/21/2020   CO2 24 02/21/2020   BUN 60 (H) 02/21/2020   BUN 27 (H) 06/01/2017   CREATININE 6.30 (H) 02/21/2020   CREATININE 7.07 (HH) 07/16/2019   CREATININE 1.96 (H) 07/19/2011   PROT 7.9 02/21/2020    PROT 8.0 06/01/2017   ALBUMIN 3.6 02/21/2020   ALBUMIN 4.4 06/01/2017   BILITOT 0.5 02/21/2020   BILITOT 0.4 07/16/2019   ALKPHOS 71 02/21/2020   AST 21 02/21/2020   AST 15 07/16/2019   ALT 20 02/21/2020   ALT 20 07/16/2019  .   Total Time in preparing paper work, data evaluation and todays exam - 27 minutes  Lala Lund M.D on 02/22/2020 at 10:45 AM  [facility]

## 2020-02-22 NOTE — TOC Transition Note (Addendum)
Transition of Care [facility]) - CM/SW Discharge Note   Patient Details  Name: [name] MRN: [id number] Date of Birth: 1965-05-30  Transition of Care [facility]) CM/SW Contact:  Verdell Carmine, RN Phone Number: 02/22/2020, 12:26 PM   Clinical Narrative:    PT evaluation today indicated 4 Wheeled walker with seat would be more beneficial to patient then a rolling walker with 5 inch wheels. This was ordered via [employer]   Final next level of care: Home/Self Care Barriers to Discharge: No Barriers Identified   Patient Goals and CMS Choice        Discharge Placement                       Discharge Plan and Services   Discharge Planning Services: CM Consult            DME Arranged: Walker rolling DME Agency: [employer] Date DME Agency Contacted: 02/21/20 Time DME Agency Contacted: [phone] Representative spoke with at DME Agency: DME            Social Determinants of Health ([place]) Interventions     Readmission Risk Interventions No flowsheet data found.

## 2020-02-22 NOTE — Plan of Care (Signed)
Patient is impatiently awaiting discharge.  She pulled out her Iv's on her own.  She states that she understands her followup instructions and is ready to go.

## 2020-02-23 ENCOUNTER — Telehealth (HOSPITAL_COMMUNITY): Payer: Self-pay | Admitting: Nephrology

## 2020-02-23 NOTE — Telephone Encounter (Signed)
Transition of care contact from inpatient facility  Date of Discharge: 02/22/2020 Date of Contact: 02/23/2020 - attempted Method of contact: Phone  Attempted to contact patient to discuss transition of care from inpatient admission. Patient did not answer the phone and there was no ability to leave a message.  Reviewed her OP HD records, she did go to HD yesterday (1/21) - completed full 3hr. Did not reach her EDW but otherwise no issues.  Veneta Penton, PA-C [employer] Pager [phone]

## 2020-02-25 ENCOUNTER — Inpatient Hospital Stay (HOSPITAL_COMMUNITY)
Admission: EM | Admit: 2020-02-25 | Discharge: 2020-02-28 | DRG: 177 | Disposition: A | Discharge status: Home Health | Payer: Medicare Other | Source: Other Acute Inpatient Hospital | Attending: Internal Medicine | Admitting: Internal Medicine

## 2020-02-25 ENCOUNTER — Other Ambulatory Visit: Payer: Self-pay

## 2020-02-25 ENCOUNTER — Encounter (HOSPITAL_COMMUNITY): Payer: Self-pay | Admitting: Emergency Medicine

## 2020-02-25 ENCOUNTER — Emergency Department (HOSPITAL_COMMUNITY): Payer: Medicare Other

## 2020-02-25 DIAGNOSIS — E1122 Type 2 diabetes mellitus with diabetic chronic kidney disease: Secondary | ICD-10-CM | POA: Diagnosis present

## 2020-02-25 DIAGNOSIS — J9600 Acute respiratory failure, unspecified whether with hypoxia or hypercapnia: Secondary | ICD-10-CM | POA: Diagnosis not present

## 2020-02-25 DIAGNOSIS — D631 Anemia in chronic kidney disease: Secondary | ICD-10-CM | POA: Diagnosis present

## 2020-02-25 DIAGNOSIS — Z823 Family history of stroke: Secondary | ICD-10-CM

## 2020-02-25 DIAGNOSIS — J9601 Acute respiratory failure with hypoxia: Secondary | ICD-10-CM | POA: Diagnosis present

## 2020-02-25 DIAGNOSIS — Z87891 Personal history of nicotine dependence: Secondary | ICD-10-CM

## 2020-02-25 DIAGNOSIS — D696 Thrombocytopenia, unspecified: Secondary | ICD-10-CM

## 2020-02-25 DIAGNOSIS — U071 COVID-19: Principal | ICD-10-CM | POA: Diagnosis present

## 2020-02-25 DIAGNOSIS — I10 Essential (primary) hypertension: Secondary | ICD-10-CM | POA: Diagnosis present

## 2020-02-25 DIAGNOSIS — Z8249 Family history of ischemic heart disease and other diseases of the circulatory system: Secondary | ICD-10-CM

## 2020-02-25 DIAGNOSIS — J189 Pneumonia, unspecified organism: Secondary | ICD-10-CM

## 2020-02-25 DIAGNOSIS — E1159 Type 2 diabetes mellitus with other circulatory complications: Secondary | ICD-10-CM | POA: Diagnosis present

## 2020-02-25 DIAGNOSIS — I2510 Atherosclerotic heart disease of native coronary artery without angina pectoris: Secondary | ICD-10-CM | POA: Diagnosis present

## 2020-02-25 DIAGNOSIS — N186 End stage renal disease: Secondary | ICD-10-CM

## 2020-02-25 DIAGNOSIS — Z992 Dependence on renal dialysis: Secondary | ICD-10-CM

## 2020-02-25 DIAGNOSIS — Z833 Family history of diabetes mellitus: Secondary | ICD-10-CM

## 2020-02-25 DIAGNOSIS — D6959 Other secondary thrombocytopenia: Secondary | ICD-10-CM | POA: Diagnosis present

## 2020-02-25 DIAGNOSIS — I120 Hypertensive chronic kidney disease with stage 5 chronic kidney disease or end stage renal disease: Secondary | ICD-10-CM | POA: Diagnosis present

## 2020-02-25 DIAGNOSIS — Z79899 Other long term (current) drug therapy: Secondary | ICD-10-CM

## 2020-02-25 DIAGNOSIS — Z83438 Family history of other disorder of lipoprotein metabolism and other lipidemia: Secondary | ICD-10-CM

## 2020-02-25 DIAGNOSIS — G894 Chronic pain syndrome: Secondary | ICD-10-CM | POA: Diagnosis present

## 2020-02-25 DIAGNOSIS — E1165 Type 2 diabetes mellitus with hyperglycemia: Secondary | ICD-10-CM | POA: Diagnosis present

## 2020-02-25 DIAGNOSIS — Z794 Long term (current) use of insulin: Secondary | ICD-10-CM

## 2020-02-25 DIAGNOSIS — G8929 Other chronic pain: Secondary | ICD-10-CM | POA: Diagnosis present

## 2020-02-25 DIAGNOSIS — E1151 Type 2 diabetes mellitus with diabetic peripheral angiopathy without gangrene: Secondary | ICD-10-CM | POA: Diagnosis present

## 2020-02-25 NOTE — ED Provider Notes (Signed)
Care assumed from Dr. 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  He is pending labs and will need to be admitted.  Labs show mild thrombocytopenia which is probably not clinically significant.  Surprisingly, hemoglobin is normal which is unusual for a patient with end-stage renal disease.  Mild thrombocytopenia is present, probably related to her Covid infection.  Case is discussed with Dr. Hal Hope [facility] hospitalist, who agrees to admit the patient.  Results for orders placed or performed during the hospital encounter of 02/25/20  Lactic acid, plasma  Result Value Ref Range   Lactic Acid, Venous 1.1 0.5 - 1.9 mmol/L  Lactic acid, plasma  Result Value Ref Range   Lactic Acid, Venous 1.0 0.5 - 1.9 mmol/L  CBC WITH DIFFERENTIAL  Result Value Ref Range   WBC 5.5 4.0 - 10.5 K/uL   RBC 4.05 3.87 - 5.11 MIL/uL   Hemoglobin 13.0 12.0 - 15.0 g/dL   HCT 39.9 36.0 - 46.0 %   MCV 98.5 80.0 - 100.0 fL   MCH 32.1 26.0 - 34.0 pg   MCHC 32.6 30.0 - 36.0 g/dL   RDW 14.4 11.5 - 15.5 %   Platelets 112 (L) 150 - 400 K/uL   nRBC 0.0 0.0 - 0.2 %   Neutrophils Relative % 63 %   Neutro Abs 3.6 1.7 - 7.7 K/uL   Lymphocytes Relative 21 %   Lymphs Abs 1.1 0.7 - 4.0 K/uL   Monocytes Relative 9 %   Monocytes Absolute 0.5 0.1 - 1.0 K/uL   Eosinophils Relative 6 %   Eosinophils Absolute 0.3 0.0 - 0.5 K/uL   Basophils Relative 0 %   Basophils Absolute 0.0 0.0 - 0.1 K/uL   Immature Granulocytes 1 %   Abs Immature Granulocytes 0.04 0.00 - 0.07 K/uL  Comprehensive metabolic panel  Result Value Ref Range   Sodium 138 135 - 145 mmol/L   Potassium 4.0 3.5 - 5.1 mmol/L   Chloride 96 (L) 98 - 111 mmol/L   CO2 29 22 - 32 mmol/L   Glucose, Bld 85 70 - 99 mg/dL   BUN 50 (H) 6 - 20 mg/dL   Creatinine, Ser 5.55 (H) 0.44 - 1.00 mg/dL   Calcium 8.6 (L) 8.9 - 10.3 mg/dL   Total Protein 7.4 6.5 - 8.1 g/dL   Albumin 3.2 (L) 3.5 - 5.0 g/dL   AST 14 (L) 15 - 41  U/L   ALT 17 0 - 44 U/L   Alkaline Phosphatase 76 38 - 126 U/L   Total Bilirubin 0.8 0.3 - 1.2 mg/dL   GFR, Estimated 9 (L) >60 mL/min   Anion gap 13 5 - 15  D-dimer, quantitative  Result Value Ref Range   D-Dimer, Quant 3.14 (H) 0.00 - 0.50 ug/mL-FEU  Procalcitonin  Result Value Ref Range   Procalcitonin 0.13 ng/mL  Lactate dehydrogenase  Result Value Ref Range   LDH 161 98 - 192 U/L  Ferritin  Result Value Ref Range   Ferritin 1,323 (H) 11 - 307 ng/mL  Triglycerides  Result Value Ref Range   Triglycerides 122 <150 mg/dL  Fibrinogen  Result Value Ref Range   Fibrinogen 527 (H) 210 - 475 mg/dL  C-reactive protein  Result Value Ref Range   CRP 6.5 (H) <1.0 mg/dL  Brain natriuretic peptide  Result Value Ref Range   B Natriuretic Peptide 594.8 (H) 0.0 - 100.0 pg/mL  Troponin I (High Sensitivity)  Result Value Ref Range  Troponin I (High Sensitivity) 72 (H) <18 ng/L   CT ANGIO CHEST PE W OR WO CONTRAST  Result Date: 02/19/2020 CLINICAL DATA:  COVID positive. Acute respiratory failure with hypoxia. EXAM: CT ANGIOGRAPHY CHEST WITH CONTRAST TECHNIQUE: Multidetector CT imaging of the chest was performed using the standard protocol during bolus administration of intravenous contrast. Multiplanar CT image reconstructions and MIPs were obtained to evaluate the vascular anatomy. CONTRAST:  29mL OMNIPAQUE IOHEXOL 350 MG/ML SOLN COMPARISON:  PET-CT August 15, 2019 and CT a chest November 14, 2018. FINDINGS: Cardiovascular: Satisfactory opacification of the pulmonary arteries to the mid segmental level. No evidence of pulmonary embolism. Cardiomegaly similar prior. No pericardial effusion. Left upper extremity PICC with tip in the right atrium. Mediastinum/Nodes: No significant change in the prominent/mildly enlarged mediastinal and axillary lymph nodes, for instance the mildly metabolic precarinal lymph node measures 1.5 cm in maximum axial diameter, and is unchanged when remeasured for  consistency (series 5, image 33). Multinodular appearance of the thyroid with the largest nodule measuring 1.6 cm in the left lobe. The trachea and esophagus are otherwise unremarkable. Lungs/Pleura: Mosaic attenuation with multifocal ground-glass opacities and nodularity. No pleural effusion or pneumothorax. Upper Abdomen: No right upper pole renal neoplasm incompletely visualized on today's exam. Musculoskeletal: Multilevel degenerative changes spine. No suspicious lytic or blastic lesion of bone. Review of the MIP images confirms the above findings. IMPRESSION: 1. No evidence of pulmonary embolism. 2. Mosaic attenuation with multifocal ground-glass opacities and nodularity, which may represent multifocal pneumonia or edema. 3. No significant change in the prominent/mildly enlarged mediastinal and axillary lymph nodes, which may be reactive. 4. Enlarged multinodular appearance of the thyroid with a 1.6 cm left thyroid nodule, further evaluation with thyroid ultrasound could be utilized if not previously performed. Electronically Signed   By: Dahlia Bailiff MD   On: 02/19/2020 11:19   DG Chest Port 1 View  Result Date: 02/25/2020 CLINICAL DATA:  COVID, shortness of breath EXAM: PORTABLE CHEST 1 VIEW COMPARISON:  02/18/2020 FINDINGS: Right dialysis catheter remains in place, unchanged. Cardiomegaly. Bilateral airspace opacities are again noted, similar on the right to prior study, slightly increased in the left mid lung. No effusions or pneumothorax. IMPRESSION: Bilateral airspace disease, slightly worsening on the left since prior study. Stable cardiomegaly. Electronically Signed   By: Rolm Baptise M.D.   On: 02/25/2020 22:28   DG Chest Portable 1 View  Result Date: 02/18/2020 CLINICAL DATA:  Shortness of breath.  History of urologic carcinoma EXAM: PORTABLE CHEST 1 VIEW COMPARISON:  November 14, 2018 chest radiograph; PET-CT August 15, 2019. FINDINGS: Central catheter tip is in the right atrium. No  pneumothorax. There is cardiomegaly with pulmonary venous hypertension. Small left pleural effusion noted. There is diffuse interstitial thickening, likely indicative of pulmonary edema. No consolidation. There is a nodular lesion in the left mid lung measuring 1.2 x 1.1 cm. There is a questionable nodular area overlying the inferior right hilum measuring 2.3 x 2.1 cm. No appreciable adenopathy by radiography.  No bone lesions. IMPRESSION: 1. Well-defined nodular opacity in left perihilar region measuring 1.2 x 1.1 cm. Questionable less well-defined nodular opacity right lower lung region medially measuring 2.3 x 2.1 cm. These findings raise concern for metastatic foci. Correlation with chest CT, ideally with intravenous contrast, advised to further evaluate. 2. Cardiomegaly with pulmonary vascular congestion. Diffuse interstitial prominence likely represents interstitial pulmonary edema. Suspect a degree of congestive heart failure. Note that lymphangitic spread of tumor potentially could present in this manner. CT  may be helpful for further delineation in this regard. 3.  Central catheter tip in right atrium.  No pneumothorax. Electronically Signed   By: Lowella Grip III M.D.   On: 02/18/2020 12:10   VAS [place] LOWER EXTREMITY VENOUS (DVT)  Result Date: 02/19/2020  Lower Venous DVT Study Indications: Swelling, and Edema.  Comparison Study: no prior Performing Technologist: Abram Sander RVS  Examination Guidelines: A complete evaluation includes B-mode imaging, spectral Doppler, color Doppler, and power Doppler as needed of all accessible portions of each vessel. Bilateral testing is considered an integral part of a complete examination. Limited examinations for reoccurring indications may be performed as noted. The reflux portion of the exam is performed with the patient in reverse Trendelenburg.  +---------+---------------+---------+-----------+----------+--------------+ RIGHT     CompressibilityPhasicitySpontaneityPropertiesThrombus Aging +---------+---------------+---------+-----------+----------+--------------+ CFV      Full           Yes      Yes                                 +---------+---------------+---------+-----------+----------+--------------+ SFJ      Full                                                        +---------+---------------+---------+-----------+----------+--------------+ FV Prox  Full                                                        +---------+---------------+---------+-----------+----------+--------------+ FV Mid   Full                                                        +---------+---------------+---------+-----------+----------+--------------+ FV Distal               Yes      Yes                                 +---------+---------------+---------+-----------+----------+--------------+ PFV      Full                                                        +---------+---------------+---------+-----------+----------+--------------+ POP      Full           Yes      Yes                                 +---------+---------------+---------+-----------+----------+--------------+ PTV      Full                                                        +---------+---------------+---------+-----------+----------+--------------+  PERO     Full                                                        +---------+---------------+---------+-----------+----------+--------------+   +---------+---------------+---------+-----------+----------+-------------------+ LEFT     CompressibilityPhasicitySpontaneityPropertiesThrombus Aging      +---------+---------------+---------+-----------+----------+-------------------+ CFV      Full           Yes      Yes                                      +---------+---------------+---------+-----------+----------+-------------------+ SFJ      Full                                                              +---------+---------------+---------+-----------+----------+-------------------+ FV Prox  Full                                                             +---------+---------------+---------+-----------+----------+-------------------+ FV Mid   Full                                                             +---------+---------------+---------+-----------+----------+-------------------+ FV Distal               Yes      Yes                                      +---------+---------------+---------+-----------+----------+-------------------+ PFV      Full                                                             +---------+---------------+---------+-----------+----------+-------------------+ POP      Full           Yes      Yes                                      +---------+---------------+---------+-----------+----------+-------------------+ PTV      Full                                                             +---------+---------------+---------+-----------+----------+-------------------+ PERO  Not well visualized +---------+---------------+---------+-----------+----------+-------------------+     Summary: BILATERAL: - No evidence of deep vein thrombosis seen in the lower extremities, bilaterally. - No evidence of superficial venous thrombosis in the lower extremities, bilaterally. -No evidence of popliteal cyst, bilaterally.   *See table(s) above for measurements and observations. Electronically signed by Deitra Mayo MD on 02/19/2020 at 10:22:57 AM.    Final       Delora Fuel, MD 03/00/92 [phone]

## 2020-02-25 NOTE — ED Provider Notes (Signed)
[facility] EMERGENCY DEPARTMENT Provider Note   CSN: [id number] Arrival date & time: 02/25/20  2203     History Chief Complaint  Patient presents with  . Covid+/SOB    Hemodialysis    [name] is a 55 y.o. female.  Presents to ER with concern for hypoxia.  Recent admission for COVID-19 pneumonia, hypoxia.  Patient discharged on 1/21.  Patient reports that she was feeling much better at time of discharge, today while she was at dialysis she reports that the staff noted that she was very hypoxic.  She completed her dialysis session and came to ER.  She says that she still feels fatigued but has no other new symptoms today.  Specifically denies any chest pain back pain, fevers vomiting or other new medical concerns.  Obtained additional history via chart review, recent discharge summary.  HPI     Past Medical History:  Diagnosis Date  . Anemia   . Anxiety   . Arthritis    "knees" "hands", "RA"  . CAD (coronary artery disease)    Nonobstructive on CT 2019  . CHF (congestive heart failure) ([place])   . COVID-19 10/2018  . Dyspnea    "when I have too much fluid."  . ESRD (end stage renal disease) ([place])    Dialysis [facility]- [address]  . Headache(784.0)   . Heart murmur   . High cholesterol   . History of blood transfusion   . Hypertension   . Mitral regurgitation    moderate to severe MR 10/2018 echo  . 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 Pericardial effusion   . Pneumonia    ; 11/16/2018- "touch of pneumonia" - seen in ED- 11/14/2018- on antibiotic. Has had it x 2  . PVD (peripheral vascular disease) ([place])    Right leg stent in [place].  (No records)  . Restless legs   . Sleep apnea    does not use Cpap  . Thoracic ascending aortic aneurysm (HCC)    4.4 cm 11/14/18 CTA  . Type II diabetes mellitus [facility])     Patient Active Problem List   Diagnosis Date Noted  . Chronic pain 03/20/2019  . Preop cardiovascular exam  12/19/2018  . Dyslipidemia 11/20/2018  . Shortness of breath 10/23/2018  . Acute respiratory distress 10/23/2018  . Chronic bilateral pleural effusions 10/23/2018  . Obesity 10/23/2018  . Coronary artery disease 10/23/2018  . Tobacco abuse 10/23/2018  . Volume overload 10/23/2018  . Unspecified protein-calorie malnutrition ([place]) 10/11/2018  . Pneumonia 10/10/2018  . COVID-19 virus detected 10/10/2018  . Acute encephalopathy 10/09/2018  . ESRD (end stage renal disease) ([place]) 09/27/2018  . Acute respiratory failure with hypoxia ([place]) 09/26/2018  . Chronic pain disorder 09/26/2018  . Other encephalopathy 09/26/2018  . COVID-19 virus infection 09/18/2018  . Acute metabolic encephalopathy 73/42/8768  . Gangrene ([place]) 09/18/2018  . Allergy, unspecified, initial encounter 09/04/2018  . Anaphylactic shock, unspecified, initial encounter 09/04/2018  . Left arm pain 06/27/2018  . Educated about COVID-19 virus infection 06/27/2018  . Iron deficiency anemia, unspecified 01/10/2018  . Coagulation defect, unspecified ([place]) 12/06/2017  . Diarrhea, unspecified 10/11/2017  . Anemia 09/26/2017  . Left hip pain 09/26/2017  . Neck pain 09/26/2017  . Leukocytosis 09/26/2017  . Pruritus, unspecified 09/22/2017  . Secondary hyperparathyroidism of renal origin ([place]) 09/01/2017  . Disorder of phosphorus metabolism, unspecified 08/25/2017  . Anemia in chronic kidney disease 08/24/2017  .  Chest pain 07/07/2017  . Left ventricular hypertrophy 06/17/2017  . ESRD on dialysis ([facility]) 05/21/2011  . Diabetic retinopathy 05/21/2011  . Diastolic dysfunction 77/82/4235  . Hyperlipidemia 01/30/2011  . Type II diabetes mellitus with complication, uncontrolled ([place]) 01/29/2011  . Neuropathy 01/29/2011  . Hypertension     Past Surgical History:  Procedure Laterality Date  . AMPUTATION Left 12/27/2018   Procedure: LEFT THUMB REVISION AMPUTATION DIGIT;  Surgeon: Charlotte Crumb, MD;  Location: [place];   Service: Orthopedics;  Laterality: Left;  . AV FISTULA PLACEMENT Left   . AV FISTULA PLACEMENT Right 03/12/2019   Procedure: INSERTION OF ARTERIOVENOUS (AV) GORE-TEX GRAFT ARM;  Surgeon: Elam Dutch, MD;  Location: [facility];  Service: Vascular;  Laterality: Right;  . AV FISTULA PLACEMENT Right 08/13/2019   Procedure: RIGHT UPPER ARM ARTERIOVENOUS (AV) FISTULA CREATION WITH BASILIC VEIN;  Surgeon: Elam Dutch, MD;  Location: [place];  Service: Vascular;  Laterality: Right;  . [place]; 1997   x 2  . COLONOSCOPY    . EYE SURGERY Bilateral    cataract surgery  . INSERTION OF DIALYSIS CATHETER Right 09/26/2018   Procedure: INSERTION OF DIALYSIS CATHETER Right Internal Jugular.;  Surgeon: Elam Dutch, MD;  Location: [place];  Service: Vascular;  Laterality: Right;  . IR FLUORO GUIDE CV LINE RIGHT  05/04/2019  . IR US GUIDE VASC ACCESS RIGHT  05/04/2019  . LIGATION OF ARTERIOVENOUS  FISTULA Left 09/26/2018   Procedure: LIGATION OF ARTERIOVENOUS  FISTULA LEFT ARM;  Surgeon: Elam Dutch, MD;  Location: [name];  Service: Vascular;  Laterality: Left;  . PERICARDIAL WINDOW  02/2004   for pericardial effusion  . PERIPHERAL VASCULAR INTERVENTION Right 10/26/2019   Procedure: PERIPHERAL VASCULAR INTERVENTION;  Surgeon: Elam Dutch, MD;  Location: [place] [facility];  Service: Cardiovascular;  Laterality: Right;  arm  AV fistula  . TUBAL LIGATION  05/1995  . UPPER EXTREMITY VENOGRAPHY N/A 06/22/2019   Procedure: UPPER EXTREMITY VENOGRAPHY - Right [place];  Surgeon: Elam Dutch, MD;  Location: [place] [facility];  Service: Cardiovascular;  Laterality: N/A;     OB History   No obstetric history on file.     Family History  Problem Relation Age of Onset  . Cancer Brother   . Heart disease Father        Died age 15  . Diabetes Father   . Hyperlipidemia Father   . Hypertension Father   . Stroke Father   . Diabetes Mother   . Hypertension Mother   . Stroke Mother    . Dementia Mother   . Diabetes Sister   . Diabetes Brother   . Hypertension Sister   . Hypertension Brother     Social History   Tobacco Use  . Smoking status: Former Smoker    Years: 33.00    Types: Cigarettes    Quit date: 07/17/2019    Years since quitting: 0.6  . Smokeless tobacco: Never Used  . Tobacco comment: using NIcotene gum, rare 1/2 cigarette  Vaping Use  . Vaping Use: Never used  Substance Use Topics  . Alcohol use: No  . Drug use: No    Home Medications Prior to Admission medications   Medication Sig Start Date End Date Taking? Authorizing Provider  albuterol (VENTOLIN HFA) 108 (90 Base) MCG/ACT inhaler Inhale 2 puffs into the lungs every 6 (six) hours as needed for wheezing or shortness of breath. 02/22/20   Candiss Norse,  Margaree Mackintosh, MD  AURYXIA 1 GM 210 MG(Fe) tablet Take 630-1,050 mg by mouth See admin instructions. Take 5 tablets (1050 mg) by mouth with each meal & take 3 tablets (630 mg) by mouth with each snack 05/29/18   [provider]  b complex-vitamin c-folic acid (NEPHRO-VITE) 0.8 MG TABS tablet Take 1 tablet by mouth Every Tuesday,Thursday,and Saturday with dialysis.  04/12/19   [provider]  blood glucose meter kit and supplies KIT Dispense based on patient and insurance preference. Use up to four times daily as directed. (FOR ICD-9 250.00, 250.01). 10/25/18   Hongalgi, Lenis Dickinson, MD  calcitRIOL (ROCALTROL) 0.5 MCG capsule Take 0.5 mcg by mouth as directed. Given at Dialysis    [provider]  HUMIRA PEN 40 MG/0.4ML PNKT Inject 40 mg into the skin every 14 (fourteen) days. 10/10/19   [provider]  hydrOXYzine (ATARAX/VISTARIL) 25 MG tablet Take 25 mg by mouth daily.    [provider]  Insulin Lispro Prot & Lispro (HUMALOG 75/25 MIX) (75-25) 100 UNIT/ML Kwikpen Inject 30 Units into the skin 2 (two) times daily with a meal.     [provider]  Methoxy PEG-Epoetin Beta (MIRCERA IJ) Mircera 09/01/19   [provider]  metoprolol succinate (TOPROL-XL) 100 MG 24 hr tablet Take 100 mg by mouth daily. Take with or immediately following a meal.    [provider]  NARCAN 4 MG/0.1ML LIQD nasal spray kit Place 1 spray into the nose as needed (accidental overdose.).  10/10/19   [provider]  Oxycodone HCl 10 MG TABS Take 10 mg by mouth 3 (three) times daily as needed for pain. 02/08/20   [provider]  pramipexole (MIRAPEX) 0.5 MG tablet Take 0.5 mg by mouth daily.  05/18/17   [provider]  rosuvastatin (CRESTOR) 40 MG tablet TAKE 1 TABLET BY MOUTH EVERY DAY *PATIENT NEEDS APPOINTMENT FOR FURTHER REFILLS* Patient taking differently: Take 40 mg by mouth daily. 02/18/20   Minus Breeding, MD    Allergies    Bupropion  Review of Systems   Review of Systems  Constitutional: Positive for chills and fatigue. Negative for fever.  HENT: Negative for ear pain and sore throat.   Eyes: Negative for pain and visual disturbance.  Respiratory: Positive for cough and shortness of breath.   Cardiovascular: Negative for chest pain and palpitations.  Gastrointestinal: Negative for abdominal pain and vomiting.  Genitourinary: Negative for dysuria and hematuria.  Musculoskeletal: Negative for arthralgias and back pain.  Skin: Negative for color change and rash.  Neurological: Negative for seizures and syncope.  All other systems reviewed and are negative.   Physical Exam Updated Vital Signs BP 140/71   Pulse 74   Temp 98.1 F (36.7 C) (Axillary)   Resp 16   LMP  (LMP Unknown) Comment: LMP Feb 2011  SpO2 96%   Physical Exam Vitals and nursing note reviewed.  Constitutional:      Appearance: She is well-developed and well-nourished.     Comments: Nonrebreather in place, not in distress  HENT:     Head: Normocephalic and atraumatic.  Eyes:     Conjunctiva/sclera: Conjunctivae normal.  Cardiovascular:     Rate and Rhythm: Normal rate and regular rhythm.      Heart sounds: No murmur heard.   Pulmonary:     Comments: Coarse breath sounds bilaterally, nonrebreather 15 L, mild tachypnea, speaks in full sentences Abdominal:     Palpations: Abdomen is soft.  Tenderness: There is no abdominal tenderness.  Musculoskeletal:        General: No edema.     Cervical back: Neck supple.  Skin:    General: Skin is warm and dry.  Neurological:     General: No focal deficit present.     Mental Status: She is alert.  Psychiatric:        Mood and Affect: Mood and affect and mood normal.        Behavior: Behavior normal.     ED Results / Procedures / Treatments   Labs (all labs ordered are listed, but only abnormal results are displayed) Labs Reviewed  CULTURE, BLOOD (ROUTINE X 2)  CULTURE, BLOOD (ROUTINE X 2)  LACTIC ACID, PLASMA  LACTIC ACID, PLASMA  CBC WITH DIFFERENTIAL/PLATELET  COMPREHENSIVE METABOLIC PANEL  D-DIMER, QUANTITATIVE (NOT AT [facility])  PROCALCITONIN  LACTATE DEHYDROGENASE  FERRITIN  TRIGLYCERIDES  FIBRINOGEN  C-REACTIVE PROTEIN  BRAIN NATRIURETIC PEPTIDE  TROPONIN I (HIGH SENSITIVITY)    EKG EKG Interpretation  Date/Time:  Monday February 25 2020 22:18:45 EST Ventricular Rate:  73 PR Interval:    QRS Duration: 158 QT Interval:  449 QTC Calculation: 495 R Axis:   70 Text Interpretation: Sinus rhythm Borderline prolonged PR interval Right bundle branch block Confirmed by Madalyn Rob [id number]) on 02/25/2020 10:32:41 PM   Radiology DG Chest Port 1 View  Result Date: 02/25/2020 CLINICAL DATA:  COVID, shortness of breath EXAM: PORTABLE CHEST 1 VIEW COMPARISON:  02/18/2020 FINDINGS: Right dialysis catheter remains in place, unchanged. Cardiomegaly. Bilateral airspace opacities are again noted, similar on the right to prior study, slightly increased in the left mid lung. No effusions or pneumothorax. IMPRESSION: Bilateral airspace disease, slightly worsening on the left since prior study. Stable cardiomegaly.  Electronically Signed   By: Rolm Baptise M.D.   On: 02/25/2020 22:28     Procedures .Critical Care Performed by: Lucrezia Starch, MD Authorized by: Lucrezia Starch, MD   Critical care provider statement:    Critical care time (minutes):  45   Critical care was necessary to treat or prevent imminent or life-threatening deterioration of the following conditions:  Respiratory failure   Critical care was time spent personally by me on the following activities:  Discussions with consultants, evaluation of patient's response to treatment, examination of patient, ordering and performing treatments and interventions, ordering and review of laboratory studies, ordering and review of radiographic studies, pulse oximetry, re-evaluation of patient's condition, obtaining history from patient or surrogate and review of old charts     Medications Ordered in ED Medications - No data to display  ED Course  I have reviewed the triage vital signs and the nursing notes.  Pertinent labs & imaging results that were available during my care of the patient were reviewed by me and considered in my medical decision making (see chart for details).    MDM Rules/Calculators/A&P                         55 year old ESRD dialysis patient recently admitted for COVID-19 presents to ER with concern for increased hypoxia.  On arrival to ER, patient on 15 L nonrebreather.  CXR with worsening infiltrates.  Suspect worsening Covid pneumonia.  Will check preadmission labs.  While awaiting labs, signed out to Dr. Roxanne Mins.  Anticipate admission back to hospitalist service.  [name] was evaluated in Emergency Department on 02/25/2020 for the symptoms described in the history of present illness.  She was evaluated in the context of the global COVID-19 pandemic, which necessitated consideration that the patient might be at risk for infection with the SARS-CoV-2 virus that causes COVID-19. Institutional protocols and  algorithms that pertain to the evaluation of patients at risk for COVID-19 are in a state of rapid change based on information released by regulatory bodies including the CDC and federal and state organizations. These policies and algorithms were followed during the patient's care in the ED.  Final Clinical Impression(s) / ED Diagnoses Final diagnoses:  Acute respiratory failure with hypoxia ([place])  COVID-19  Multifocal pneumonia    Rx / DC Orders ED Discharge Orders    None       Lucrezia Starch, MD 02/25/20 2315

## 2020-02-25 NOTE — ED Triage Notes (Addendum)
Patient arrived with EMS from hemodialysis center this evening , staff reported low O2 sat = 80's -70's with SOB , he completed her hemodialysis treatment his evening . No fever or chills . He is Covid+.

## 2020-02-26 ENCOUNTER — Encounter (HOSPITAL_COMMUNITY): Payer: Self-pay | Admitting: Internal Medicine

## 2020-02-26 DIAGNOSIS — Z8249 Family history of ischemic heart disease and other diseases of the circulatory system: Secondary | ICD-10-CM | POA: Diagnosis not present

## 2020-02-26 DIAGNOSIS — J9601 Acute respiratory failure with hypoxia: Secondary | ICD-10-CM

## 2020-02-26 DIAGNOSIS — Z79899 Other long term (current) drug therapy: Secondary | ICD-10-CM | POA: Diagnosis not present

## 2020-02-26 DIAGNOSIS — Z833 Family history of diabetes mellitus: Secondary | ICD-10-CM | POA: Diagnosis not present

## 2020-02-26 DIAGNOSIS — G8929 Other chronic pain: Secondary | ICD-10-CM | POA: Diagnosis present

## 2020-02-26 DIAGNOSIS — I120 Hypertensive chronic kidney disease with stage 5 chronic kidney disease or end stage renal disease: Secondary | ICD-10-CM | POA: Diagnosis present

## 2020-02-26 DIAGNOSIS — Z794 Long term (current) use of insulin: Secondary | ICD-10-CM | POA: Diagnosis not present

## 2020-02-26 DIAGNOSIS — E1122 Type 2 diabetes mellitus with diabetic chronic kidney disease: Secondary | ICD-10-CM | POA: Diagnosis present

## 2020-02-26 DIAGNOSIS — I2510 Atherosclerotic heart disease of native coronary artery without angina pectoris: Secondary | ICD-10-CM | POA: Diagnosis present

## 2020-02-26 DIAGNOSIS — Z992 Dependence on renal dialysis: Secondary | ICD-10-CM | POA: Diagnosis not present

## 2020-02-26 DIAGNOSIS — E1151 Type 2 diabetes mellitus with diabetic peripheral angiopathy without gangrene: Secondary | ICD-10-CM | POA: Diagnosis present

## 2020-02-26 DIAGNOSIS — N186 End stage renal disease: Secondary | ICD-10-CM | POA: Diagnosis present

## 2020-02-26 DIAGNOSIS — G894 Chronic pain syndrome: Secondary | ICD-10-CM | POA: Diagnosis not present

## 2020-02-26 DIAGNOSIS — Z823 Family history of stroke: Secondary | ICD-10-CM | POA: Diagnosis not present

## 2020-02-26 DIAGNOSIS — Z87891 Personal history of nicotine dependence: Secondary | ICD-10-CM | POA: Diagnosis not present

## 2020-02-26 DIAGNOSIS — D6959 Other secondary thrombocytopenia: Secondary | ICD-10-CM | POA: Diagnosis present

## 2020-02-26 DIAGNOSIS — Z83438 Family history of other disorder of lipoprotein metabolism and other lipidemia: Secondary | ICD-10-CM | POA: Diagnosis not present

## 2020-02-26 DIAGNOSIS — J9600 Acute respiratory failure, unspecified whether with hypoxia or hypercapnia: Secondary | ICD-10-CM | POA: Insufficient documentation

## 2020-02-26 DIAGNOSIS — U071 COVID-19: Secondary | ICD-10-CM | POA: Diagnosis present

## 2020-02-26 DIAGNOSIS — D631 Anemia in chronic kidney disease: Secondary | ICD-10-CM | POA: Diagnosis present

## 2020-02-26 LAB — COMPREHENSIVE METABOLIC PANEL
ALT: 17 U/L (ref 0–44)
AST: 14 U/L — ABNORMAL LOW (ref 15–41)
Albumin: 3.2 g/dL — ABNORMAL LOW (ref 3.5–5.0)
Alkaline Phosphatase: 76 U/L (ref 38–126)
Anion gap: 13 (ref 5–15)
BUN: 50 mg/dL — ABNORMAL HIGH (ref 6–20)
CO2: 29 mmol/L (ref 22–32)
Calcium: 8.6 mg/dL — ABNORMAL LOW (ref 8.9–10.3)
Chloride: 96 mmol/L — ABNORMAL LOW (ref 98–111)
Creatinine, Ser: 5.55 mg/dL — ABNORMAL HIGH (ref 0.44–1.00)
GFR, Estimated: 9 mL/min — ABNORMAL LOW (ref >60)
Glucose, Bld: 85 mg/dL (ref 70–99)
Potassium: 4 mmol/L (ref 3.5–5.1)
Sodium: 138 mmol/L (ref 135–145)
Total Bilirubin: 0.8 mg/dL (ref 0.3–1.2)
Total Protein: 7.4 g/dL (ref 6.5–8.1)

## 2020-02-26 LAB — CBC WITH DIFFERENTIAL/PLATELET
Abs Immature Granulocytes: 0.04 10*3/uL (ref 0.00–0.07)
Basophils Absolute: 0 10*3/uL (ref 0.0–0.1)
Basophils Relative: 0 %
Eosinophils Absolute: 0.3 10*3/uL (ref 0.0–0.5)
Eosinophils Relative: 6 %
HCT: 39.9 % (ref 36.0–46.0)
Hemoglobin: 13 g/dL (ref 12.0–15.0)
Immature Granulocytes: 1 %
Lymphocytes Relative: 21 %
Lymphs Abs: 1.1 10*3/uL (ref 0.7–4.0)
MCH: 32.1 pg (ref 26.0–34.0)
MCHC: 32.6 g/dL (ref 30.0–36.0)
MCV: 98.5 fL (ref 80.0–100.0)
Monocytes Absolute: 0.5 10*3/uL (ref 0.1–1.0)
Monocytes Relative: 9 %
Neutro Abs: 3.6 10*3/uL (ref 1.7–7.7)
Neutrophils Relative %: 63 %
Platelets: 112 10*3/uL — ABNORMAL LOW (ref 150–400)
RBC: 4.05 MIL/uL (ref 3.87–5.11)
RDW: 14.4 % (ref 11.5–15.5)
WBC: 5.5 10*3/uL (ref 4.0–10.5)
nRBC: 0 % (ref 0.0–0.2)

## 2020-02-26 LAB — I-STAT VENOUS BLOOD GAS, ED
Acid-Base Excess: 5 mmol/L — ABNORMAL HIGH (ref 0.0–2.0)
Bicarbonate: 31.9 mmol/L — ABNORMAL HIGH (ref 20.0–28.0)
Calcium, Ion: 1.11 mmol/L — ABNORMAL LOW (ref 1.15–1.40)
HCT: 27 % — ABNORMAL LOW (ref 36.0–46.0)
Hemoglobin: 9.2 g/dL — ABNORMAL LOW (ref 12.0–15.0)
O2 Saturation: 98 %
Potassium: 4.9 mmol/L (ref 3.5–5.1)
Sodium: 136 mmol/L (ref 135–145)
TCO2: 34 mmol/L — ABNORMAL HIGH (ref 22–32)
pCO2, Ven: 61.9 mmHg — ABNORMAL HIGH (ref 44.0–60.0)
pH, Ven: 7.32 (ref 7.250–7.430)
pO2, Ven: 124 mmHg — ABNORMAL HIGH (ref 32.0–45.0)

## 2020-02-26 LAB — C-REACTIVE PROTEIN: CRP: 6.5 mg/dL — ABNORMAL HIGH (ref <1.0)

## 2020-02-26 LAB — CBC
HCT: 28.9 % — ABNORMAL LOW (ref 36.0–46.0)
Hemoglobin: 9.5 g/dL — ABNORMAL LOW (ref 12.0–15.0)
MCH: 33 pg (ref 26.0–34.0)
MCHC: 32.9 g/dL (ref 30.0–36.0)
MCV: 100.3 fL — ABNORMAL HIGH (ref 80.0–100.0)
Platelets: 173 10*3/uL (ref 150–400)
RBC: 2.88 MIL/uL — ABNORMAL LOW (ref 3.87–5.11)
RDW: 14.4 % (ref 11.5–15.5)
WBC: 8.4 10*3/uL (ref 4.0–10.5)
nRBC: 0 % (ref 0.0–0.2)

## 2020-02-26 LAB — TROPONIN I (HIGH SENSITIVITY): Troponin I (High Sensitivity): 72 ng/L — ABNORMAL HIGH (ref <18)

## 2020-02-26 LAB — TRIGLYCERIDES: Triglycerides: 122 mg/dL (ref <150)

## 2020-02-26 LAB — CREATININE, SERUM
Creatinine, Ser: 6.33 mg/dL — ABNORMAL HIGH (ref 0.44–1.00)
GFR, Estimated: 7 mL/min — ABNORMAL LOW (ref >60)

## 2020-02-26 LAB — FERRITIN: Ferritin: 1323 ng/mL — ABNORMAL HIGH (ref 11–307)

## 2020-02-26 LAB — D-DIMER, QUANTITATIVE: D-Dimer, Quant: 3.14 ug/mL-FEU — ABNORMAL HIGH (ref 0.00–0.50)

## 2020-02-26 LAB — CBG MONITORING, ED
Glucose-Capillary: 75 mg/dL (ref 70–99)
Glucose-Capillary: 355 mg/dL — ABNORMAL HIGH (ref 70–99)
Glucose-Capillary: 440 mg/dL — ABNORMAL HIGH (ref 70–99)

## 2020-02-26 LAB — PROCALCITONIN: Procalcitonin: 0.13 ng/mL

## 2020-02-26 LAB — BRAIN NATRIURETIC PEPTIDE: B Natriuretic Peptide: 594.8 pg/mL — ABNORMAL HIGH (ref 0.0–100.0)

## 2020-02-26 LAB — LACTIC ACID, PLASMA
Lactic Acid, Venous: 1.1 mmol/L (ref 0.5–1.9)
Lactic Acid, Venous: 1 mmol/L (ref 0.5–1.9)

## 2020-02-26 LAB — LACTATE DEHYDROGENASE: LDH: 161 U/L (ref 98–192)

## 2020-02-26 LAB — FIBRINOGEN: Fibrinogen: 527 mg/dL — ABNORMAL HIGH (ref 210–475)

## 2020-02-26 MED ORDER — METOPROLOL SUCCINATE ER 100 MG PO TB24
100.0000 mg | ORAL_TABLET | Freq: Every day | ORAL | Status: DC
  Administered 2020-02-26 – 2020-02-27 (×2): 100 mg via ORAL
  Filled 2020-02-26 (×2): qty 1

## 2020-02-26 MED ORDER — CALCITRIOL 0.5 MCG PO CAPS: 0.5000 ug | ORAL_CAPSULE | ORAL | Status: DC

## 2020-02-26 MED ORDER — IPRATROPIUM-ALBUTEROL 20-100 MCG/ACT IN AERS
1.0000 | INHALATION_SPRAY | Freq: Four times a day (QID) | RESPIRATORY_TRACT | Status: DC
  Administered 2020-02-26 (×2): 1 via RESPIRATORY_TRACT
  Filled 2020-02-26: qty 4

## 2020-02-26 MED ORDER — INSULIN ASPART PROT & ASPART (70-30 MIX) 100 UNIT/ML ~~LOC~~ SUSP
25.0000 [IU] | Freq: Two times a day (BID) | SUBCUTANEOUS | Status: DC
  Administered 2020-02-27 – 2020-02-28 (×4): 25 [IU] via SUBCUTANEOUS
  Filled 2020-02-26 (×2): qty 10

## 2020-02-26 MED ORDER — ENOXAPARIN SODIUM 30 MG/0.3ML ~~LOC~~ SOLN
30.0000 mg | SUBCUTANEOUS | Status: DC
  Administered 2020-02-26 – 2020-02-27 (×2): 30 mg via SUBCUTANEOUS
  Filled 2020-02-26 (×2): qty 0.3

## 2020-02-26 MED ORDER — FERRIC CITRATE 1 GM 210 MG(FE) PO TABS
630.0000 mg | ORAL_TABLET | Freq: Three times a day (TID) | ORAL | Status: DC
  Administered 2020-02-27 (×2): 1050 mg via ORAL
  Filled 2020-02-26 (×4): qty 5

## 2020-02-26 MED ORDER — METHYLPREDNISOLONE SODIUM SUCC 40 MG IJ SOLR
40.0000 mg | Freq: Every day | INTRAMUSCULAR | Status: DC
  Administered 2020-02-26 – 2020-02-28 (×3): 40 mg via INTRAVENOUS
  Filled 2020-02-26 (×3): qty 1

## 2020-02-26 MED ORDER — ROSUVASTATIN CALCIUM 20 MG PO TABS
40.0000 mg | ORAL_TABLET | Freq: Every day | ORAL | Status: DC
  Administered 2020-02-26 – 2020-02-28 (×3): 40 mg via ORAL
  Filled 2020-02-26 (×3): qty 2

## 2020-02-26 MED ORDER — ACETAMINOPHEN 650 MG RE SUPP: 650.0000 mg | Freq: Four times a day (QID) | RECTAL | Status: DC | PRN

## 2020-02-26 MED ORDER — VANCOMYCIN HCL IN DEXTROSE 1-5 GM/200ML-% IV SOLN: 1000.0000 mg | INTRAVENOUS | Status: DC

## 2020-02-26 MED ORDER — VANCOMYCIN HCL 10 G IV SOLR
2500.0000 mg | Freq: Once | INTRAVENOUS | Status: AC
  Administered 2020-02-26: 2500 mg via INTRAVENOUS
  Filled 2020-02-26: qty 2500

## 2020-02-26 MED ORDER — ALBUTEROL SULFATE HFA 108 (90 BASE) MCG/ACT IN AERS: 2.0000 | INHALATION_SPRAY | Freq: Four times a day (QID) | RESPIRATORY_TRACT | Status: DC | PRN

## 2020-02-26 MED ORDER — ACETAMINOPHEN 325 MG PO TABS
650.0000 mg | ORAL_TABLET | Freq: Four times a day (QID) | ORAL | Status: DC | PRN
  Administered 2020-02-26: 650 mg via ORAL
  Filled 2020-02-26: qty 2

## 2020-02-26 MED ORDER — SODIUM CHLORIDE 0.9 % IV SOLN
2.0000 g | Freq: Once | INTRAVENOUS | Status: AC
  Administered 2020-02-26: 2 g via INTRAVENOUS
  Filled 2020-02-26: qty 2

## 2020-02-26 MED ORDER — INSULIN ASPART 100 UNIT/ML ~~LOC~~ SOLN
0.0000 [IU] | Freq: Three times a day (TID) | SUBCUTANEOUS | Status: DC
  Administered 2020-02-26: 9 [IU] via SUBCUTANEOUS
  Administered 2020-02-26: 3 [IU] via SUBCUTANEOUS
  Administered 2020-02-27: 2 [IU] via SUBCUTANEOUS
  Administered 2020-02-27: 5 [IU] via SUBCUTANEOUS

## 2020-02-26 MED ORDER — PRAMIPEXOLE DIHYDROCHLORIDE 1 MG PO TABS
0.5000 mg | ORAL_TABLET | Freq: Every day | ORAL | Status: DC
  Administered 2020-02-27 – 2020-02-28 (×2): 0.5 mg via ORAL
  Filled 2020-02-26 (×2): qty 1

## 2020-02-26 MED ORDER — SODIUM CHLORIDE 0.9 % IV SOLN
2.0000 g | INTRAVENOUS | Status: DC
  Filled 2020-02-26: qty 2

## 2020-02-26 MED ORDER — INSULIN LISPRO PROT & LISPRO (75-25 MIX) 100 UNIT/ML KWIKPEN: 30.0000 [IU] | PEN_INJECTOR | Freq: Two times a day (BID) | SUBCUTANEOUS | Status: DC

## 2020-02-26 NOTE — Progress Notes (Signed)
Pharmacy Antibiotic Note  [name] is a 55 y.o. female admitted on 02/25/2020 with pneumonia.  Pharmacy has been consulted for vancomycin and cefepime dosing.  Plan: Vancomycin 2500mg  x1 then 1000mg  IV every HD.  Goal pre-HD level 15-25 mcg/mL. Cefepime 2g IV now and after each HD.    Temp (24hrs), Avg:98.1 F (36.7 C), Min:98.1 F (36.7 C), Max:98.1 F (36.7 C)  Recent Labs  Lab 02/20/20 0206 02/21/20 0514 02/26/20 0004 02/26/20 0232  WBC 6.6 7.3 5.5  --   CREATININE 7.94* 6.30* 5.55*  --   LATICACIDVEN  --   --  1.1 1.0    Estimated Creatinine Clearance: 14.8 mL/min (A) (by C-G formula based on SCr of 5.55 mg/dL (H)).    Allergies  Allergen Reactions  . Bupropion Itching     Thank you for allowing pharmacy to be a part of this patient's care.  Wynona Neat, PharmD, BCPS  02/26/2020 5:27 AM

## 2020-02-26 NOTE — Progress Notes (Signed)
RT attempted ABG drawn multiple times, unable to obtain adequate sample from patient.

## 2020-02-26 NOTE — H&P (Signed)
History and Physical    [name]:[id number] DOB: November 26, 1965 DOA: 02/25/2020  PCP: Sandi Mariscal, MD  Patient coming from: Home.  Chief Complaint: Shortness of breath.  HPI: [name] is a 55 y.o. female with history of ESRD on hemodialysis on Monday Wednesday Friday, diabetes mellitus type 2, hypertension, nonobstructive CAD, anemia who was recently admitted for Covid infection and discharged about 4 days ago states she has been persistently short of breath since discharge.  Denies chest pain productive cough fever chills.  Patient had gone to dialysis center when patient was found to be hypoxic with oxygen saturation in the 80% was transferred to the ER.  ED Course: In the ER patient is requiring nonrebreather to maintain sats with chest x-ray showing bilateral infiltrates with mild worsening of the left side.  Labs are significant for troponin of 72 CRP 6.5 D-dimer 3.14 and platelets of 112.  EKG shows normal sinus rhythm RBBB.  Blood cultures were obtained and patient admitted for acute respiratory failure with hypoxia with recent Covid infection.  Review of Systems: As per HPI, rest all negative.   Past Medical History:  Diagnosis Date  . Anemia   . Anxiety   . Arthritis    "knees" "hands", "RA"  . CAD (coronary artery disease)    Nonobstructive on CT 2019  . CHF (congestive heart failure) ([place])   . COVID-19 10/2018  . Dyspnea    "when I have too much fluid."  . ESRD (end stage renal disease) ([place])    Dialysis [facility]- 3rd st  . Headache(784.0)   . Heart murmur   . High cholesterol   . History of blood transfusion   . Hypertension   . Mitral regurgitation    moderate to severe MR 10/2018 echo  . 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 Pericardial effusion   . Pneumonia    ; 11/16/2018- "touch of pneumonia" - seen in ED- 11/14/2018- on antibiotic. Has had it x 2  . PVD (peripheral vascular disease) ([place])    Right leg stent in  [place].  (No records)  . Restless legs   . Sleep apnea    does not use Cpap  . Thoracic ascending aortic aneurysm (HCC)    4.4 cm 11/14/18 CTA  . Type II diabetes mellitus ([place])     Past Surgical History:  Procedure Laterality Date  . AMPUTATION Left 12/27/2018   Procedure: LEFT THUMB REVISION AMPUTATION DIGIT;  Surgeon: Charlotte Crumb, MD;  Location: [place];  Service: Orthopedics;  Laterality: Left;  . AV FISTULA PLACEMENT Left   . AV FISTULA PLACEMENT Right 03/12/2019   Procedure: INSERTION OF ARTERIOVENOUS (AV) GORE-TEX GRAFT ARM;  Surgeon: Elam Dutch, MD;  Location: [facility];  Service: Vascular;  Laterality: Right;  . AV FISTULA PLACEMENT Right 08/13/2019   Procedure: RIGHT UPPER ARM ARTERIOVENOUS (AV) FISTULA CREATION WITH BASILIC VEIN;  Surgeon: Elam Dutch, MD;  Location: [place];  Service: Vascular;  Laterality: Right;  . [place]; 1997   x 2  . COLONOSCOPY    . EYE SURGERY Bilateral    cataract surgery  . INSERTION OF DIALYSIS CATHETER Right 09/26/2018   Procedure: INSERTION OF DIALYSIS CATHETER Right Internal Jugular.;  Surgeon: Elam Dutch, MD;  Location: [place];  Service: Vascular;  Laterality: Right;  . IR FLUORO GUIDE CV LINE RIGHT  05/04/2019  . IR US GUIDE VASC ACCESS RIGHT  05/04/2019  . LIGATION OF ARTERIOVENOUS  FISTULA Left 09/26/2018   Procedure: LIGATION OF ARTERIOVENOUS  FISTULA LEFT ARM;  Surgeon: Elam Dutch, MD;  Location: [place];  Service: Vascular;  Laterality: Left;  . PERICARDIAL WINDOW  02/2004   for pericardial effusion  . PERIPHERAL VASCULAR INTERVENTION Right 10/26/2019   Procedure: PERIPHERAL VASCULAR INTERVENTION;  Surgeon: Elam Dutch, MD;  Location: [place] [facility];  Service: Cardiovascular;  Laterality: Right;  arm  AV fistula  . TUBAL LIGATION  05/1995  . UPPER EXTREMITY VENOGRAPHY N/A 06/22/2019   Procedure: UPPER EXTREMITY VENOGRAPHY - Right [place];  Surgeon: Elam Dutch, MD;  Location: [place]  [facility];  Service: Cardiovascular;  Laterality: N/A;     reports that she quit smoking about 7 months ago. Her smoking use included cigarettes. She quit after 33.00 years of use. She has never used smokeless tobacco. She reports that she does not drink alcohol and does not use drugs.  Allergies  Allergen Reactions  . Bupropion Itching    Family History  Problem Relation Age of Onset  . Cancer Brother   . Heart disease Father        Died age 17  . Diabetes Father   . Hyperlipidemia Father   . Hypertension Father   . Stroke Father   . Diabetes Mother   . Hypertension Mother   . Stroke Mother   . Dementia Mother   . Diabetes Sister   . Diabetes Brother   . Hypertension Sister   . Hypertension Brother     Prior to Admission medications   Medication Sig Start Date End Date Taking? Authorizing Provider  albuterol (VENTOLIN HFA) 108 (90 Base) MCG/ACT inhaler Inhale 2 puffs into the lungs every 6 (six) hours as needed for wheezing or shortness of breath. 02/22/20   Thurnell Lose, MD  AURYXIA 1 GM 210 MG(Fe) tablet Take 630-1,050 mg by mouth See admin instructions. Take 5 tablets (1050 mg) by mouth with each meal & take 3 tablets (630 mg) by mouth with each snack 05/29/18   [provider]  b complex-vitamin c-folic acid (NEPHRO-VITE) 0.8 MG TABS tablet Take 1 tablet by mouth Every Tuesday,Thursday,and Saturday with dialysis.  04/12/19   [provider]  blood glucose meter kit and supplies KIT Dispense based on patient and insurance preference. Use up to four times daily as directed. (FOR ICD-9 250.00, 250.01). 10/25/18   Hongalgi, Lenis Dickinson, MD  calcitRIOL (ROCALTROL) 0.5 MCG capsule Take 0.5 mcg by mouth as directed. Given at Dialysis    [provider]  HUMIRA PEN 40 MG/0.4ML PNKT Inject 40 mg into the skin every 14 (fourteen) days. 10/10/19   [provider]  hydrOXYzine (ATARAX/VISTARIL) 25 MG tablet Take 25 mg by mouth daily.    [provider]  Insulin Lispro Prot & Lispro (HUMALOG 75/25 MIX) (75-25) 100 UNIT/ML Kwikpen Inject 30 Units into the skin 2 (two) times daily with a meal.     [provider]  Methoxy PEG-Epoetin Beta (MIRCERA IJ) Mircera 09/01/19   [provider]  metoprolol succinate (TOPROL-XL) 100 MG 24 hr tablet Take 100 mg by mouth daily. Take with or immediately following a meal.    [provider]  NARCAN 4 MG/0.1ML LIQD nasal spray kit Place 1 spray into the nose as needed (accidental overdose.).  10/10/19   [provider]  Oxycodone HCl 10 MG TABS Take 10 mg by mouth 3 (three) times daily  as needed for pain. 02/08/20   [provider]  pramipexole (MIRAPEX) 0.5 MG tablet Take 0.5 mg by mouth daily.  05/18/17   [provider]  rosuvastatin (CRESTOR) 40 MG tablet TAKE 1 TABLET BY MOUTH EVERY DAY *PATIENT NEEDS APPOINTMENT FOR FURTHER REFILLS* Patient taking differently: Take 40 mg by mouth daily. 02/18/20   Minus Breeding, MD    Physical Exam: Constitutional: Moderately built and nourished. Vitals:   02/25/20 2345 02/26/20 0045 02/26/20 0145 02/26/20 0200  BP: 134/61 (!) 100/48 131/71 119/72  Pulse: 85 86  96  Resp: (!) 31 17 14 15   Temp:      TempSrc:      SpO2: 100% 100%  100%   Eyes: Anicteric no pallor. ENMT: No discharge from the ears eyes nose or mouth. Neck: No mass felt.  No neck rigidity. Respiratory: No rhonchi or crepitations. Cardiovascular: S1-S2 heard. Abdomen: Soft nontender bowel sounds present. Musculoskeletal: No edema. Skin: No rash. Neurologic: Mildly lethargic but oriented to place and person moving all extremities. Psychiatric: Mildly lethargic.   Labs on Admission: I have personally reviewed following labs and imaging studies  CBC: Recent Labs  Lab 02/19/20 0527 02/20/20 0206 02/21/20 0514 02/26/20 0004  WBC 6.7 6.6 7.3 5.5  NEUTROABS 4.1 5.8 5.8 3.6  HGB 9.6* 9.2* 9.3* 13.0  HCT 30.2* 28.6* 27.5* 39.9  MCV 102.7*  98.3 97.5 98.5  PLT 177 172 170 219*   Basic Metabolic Panel: Recent Labs  Lab 02/19/20 0527 02/20/20 0206 02/21/20 0514 02/26/20 0004  NA 138 134* 136 138  K 4.2 5.8* 4.7 4.0  CL 96* 94* 94* 96*  CO2 26 22 24 29   GLUCOSE 109* 309* 207* 85  BUN 41* 68* 60* 50*  CREATININE 5.95* 7.94* 6.30* 5.55*  CALCIUM 9.5 9.8 10.2 8.6*  MG 1.9 2.3 2.3  --   PHOS 5.2*  --   --   --    GFR: Estimated Creatinine Clearance: 14.8 mL/min (A) (by C-G formula based on SCr of 5.55 mg/dL (H)). Liver Function Tests: Recent Labs  Lab 02/19/20 0527 02/20/20 0206 02/21/20 0514 02/26/20 0004  AST 19 21 21  14*  ALT 17 18 20 17   ALKPHOS 72 79 71 76  BILITOT 0.8 0.7 0.5 0.8  PROT 7.5 8.3* 7.9 7.4  ALBUMIN 3.3* 3.6 3.6 3.2*   No results for input(s): LIPASE, AMYLASE in the last 168 hours. No results for input(s): AMMONIA in the last 168 hours. Coagulation Profile: No results for input(s): INR, PROTIME in the last 168 hours. Cardiac Enzymes: No results for input(s): CKTOTAL, CKMB, CKMBINDEX, TROPONINI in the last 168 hours. BNP (last 3 results) No results for input(s): PROBNP in the last 8760 hours. HbA1C: No results for input(s): HGBA1C in the last 72 hours. CBG: Recent Labs  Lab 02/21/20 1215 02/21/20 1630 02/21/20 2124 02/22/20 0741 02/22/20 1209  GLUCAP 133* 162* 292* 168* 219*   Lipid Profile: Recent Labs    02/26/20 0004  TRIG 122   Thyroid Function Tests: No results for input(s): TSH, T4TOTAL, FREET4, T3FREE, THYROIDAB in the last 72 hours. Anemia Panel: Recent Labs    02/26/20 0004  FERRITIN 1,323*   Urine analysis:    Component Value Date/Time   COLORURINE YELLOW 07/22/2011 1526   APPEARANCEUR CLOUDY (A) 07/22/2011 1526   LABSPEC 1.022 07/22/2011 1526   PHURINE 5.5 07/22/2011 1526   GLUCOSEU 100 (A) 07/22/2011 1526   HGBUR MODERATE (A) 07/22/2011 1526   BILIRUBINUR NEGATIVE 07/22/2011 1526  BILIRUBINUR NEG 07/19/2011 1202   KETONESUR NEGATIVE 07/22/2011 1526    PROTEINUR >300 (A) 07/22/2011 1526   UROBILINOGEN 0.2 07/22/2011 1526   NITRITE NEGATIVE 07/22/2011 1526   LEUKOCYTESUR TRACE (A) 07/22/2011 1526   Sepsis Labs: @LABRCNTIP (procalcitonin:4,lacticidven:4) ) Recent Results (from the past 240 hour(s))  Resp Panel by RT-PCR (Flu A&B, Covid) Nasopharyngeal Swab     Status: Abnormal   Collection Time: 02/18/20 12:48 PM   Specimen: Nasopharyngeal Swab; Nasopharyngeal(NP) swabs in vial transport medium  Result Value Ref Range Status   SARS Coronavirus 2 by RT PCR POSITIVE (A) NEGATIVE Final    Comment: emailed L. Berdik RN 14:40 02/18/20 ([email or web address]) (NOTE) SARS-CoV-2 target nucleic acids are DETECTED.  The SARS-CoV-2 RNA is generally detectable in upper respiratory specimens during the acute phase of infection. Positive results are indicative of the presence of the identified virus, but do not rule out bacterial infection or co-infection with other pathogens not detected by the test. Clinical correlation with patient history and other diagnostic information is necessary to determine patient infection status. The expected result is Negative.  Fact Sheet for Patients: EntrepreneurPulse.com.au  Fact Sheet for Healthcare Providers: IncredibleEmployment.be  This test is not yet approved or cleared by the [place] FDA and  has been authorized for detection and/or diagnosis of SARS-CoV-2 by FDA under an Emergency Use Authorization (EUA).  This EUA will remain in effect (meaning this test can be used) for the duration of  the COVID-1 9 declaration under Section 564(b)(1) of the Act, 21 U.S.C. section 360bbb-3(b)(1), unless the authorization is terminated or revoked sooner.     Influenza A by PCR NEGATIVE NEGATIVE Final   Influenza B by PCR NEGATIVE NEGATIVE Final    Comment: (NOTE) The Xpert Xpress SARS-CoV-2/FLU/RSV plus assay is intended as an aid in the diagnosis of influenza from Nasopharyngeal  swab specimens and should not be used as a sole basis for treatment. Nasal washings and aspirates are unacceptable for Xpert Xpress SARS-CoV-2/FLU/RSV testing.  Fact Sheet for Patients: EntrepreneurPulse.com.au  Fact Sheet for Healthcare Providers: IncredibleEmployment.be  This test is not yet approved or cleared by the [place] FDA and has been authorized for detection and/or diagnosis of SARS-CoV-2 by FDA under an Emergency Use Authorization (EUA). This EUA will remain in effect (meaning this test can be used) for the duration of the COVID-19 declaration under Section 564(b)(1) of the Act, 21 U.S.C. section 360bbb-3(b)(1), unless the authorization is terminated or revoked.  Performed at [facility], [place] [address], [place], [place] [address]   MRSA PCR Screening     Status: None   Collection Time: 02/19/20 10:52 AM   Specimen: Nasal Mucosa; Nasopharyngeal  Result Value Ref Range Status   MRSA by PCR NEGATIVE NEGATIVE Final    Comment:        The GeneXpert MRSA Assay (FDA approved for NASAL specimens only), is one component of a comprehensive MRSA colonization surveillance program. It is not intended to diagnose MRSA infection nor to guide or monitor treatment for MRSA infections. Performed at [facility], [place] [address], [place], [place] [address]      Radiological Exams on Admission: DG Chest Port 1 View  Result Date: 02/25/2020 CLINICAL DATA:  COVID, shortness of breath EXAM: PORTABLE CHEST 1 VIEW COMPARISON:  02/18/2020 FINDINGS: Right dialysis catheter remains in place, unchanged. Cardiomegaly. Bilateral airspace opacities are again noted, similar on the right to prior study, slightly increased in the left mid lung. No effusions or pneumothorax.  IMPRESSION: Bilateral airspace disease, slightly worsening on the left since prior study. Stable cardiomegaly. Electronically Signed   By: Rolm Baptise M.D.   On:  02/25/2020 22:28    EKG: Independently reviewed.  Normal sinus rhythm with RBBB.  Assessment/Plan Principal Problem:   Acute respiratory failure with hypoxia (HCC) Active Problems:   Hypertension   Type II diabetes mellitus with complication, uncontrolled (HCC)   ESRD on dialysis [facility])   Chronic pain disorder   End-stage renal disease on hemodialysis ([place])    1. Acute respiratory failure with hypoxia requiring 100% nonrebreather at this time to maintain sats more than 90% with recent Covid infection suspect likely related to worsening pulmonary changes to Covid.  Patient's procalcitonin levels were around 0.13 with chest x-ray showing persistent and worsening infiltrates we will keep patient on IV steroids empiric antibiotics follow blood cultures follow inflammatory markers.  Check ABG since patient is mildly lethargic.  Also check respiratory status after dialysis as per the report patient had complete session yesterday. 2. ESRD on hemodialysis on Monday Wednesday Friday.  Per report patient had complete session yesterday. 3. Chronic pain we will hold pain medication since patient is mildly lethargic on exam.  Check ABG to make sure there is no carbon dioxide retention. 4. Diabetes mellitus type 2 we will continue patient's home dose of 75/25 insulin 30 units twice daily with sliding scale coverage.  Follow CBGs closely since patient is on steroids. 5. Hypertension on metoprolol. 6. Thrombocytopenia could be related to infectious process.  Follow CBC.  Follow blood cultures.  Since patient has acute respiratory failure with hypoxia will need close monitoring for any further worsening in inpatient status.   DVT prophylaxis: Lovenox. Code Status: Full code. Family Communication: Discussed with patient. Disposition Plan: Home. Consults called: None. Admission status: Inpatient.   Rise Patience MD [facility] Pager [phone].  If 7PM-7AM, please contact  night-coverage [email or web address] Password [email or web address]  02/26/2020, 5:20 AM

## 2020-02-26 NOTE — Progress Notes (Signed)
55 yo F w/ Hx of ESRD on HD, DM2, HTN. Presenting with acute hypoxic respiratory failure. pMN admission. See H&P for full details. She has been weaned to 3-4L [place]. Vitals appear stable. She is feeling better overall. Procal is only 0.13. Rpt procal in AM, if it remains low, d/c abx. Continue steroids for now. Nephro consulted. ABG looks ok. Otherwise, continue as per H&P.   Physical Exam: General: 55 y.o. female resting in bed in NAD Eyes: PERRL, normal sclera ENMT: Nares patent w/o discharge, orophaynx clear, dentition normal, ears w/o discharge/lesions/ulcers Neck: Supple, trachea midline Cardiovascular: RRR, +S1, S2, no m/g/r, equal pulses throughout Respiratory: decreased at bases, no w/r/r, normal WOB on 3L [place] GI: BS+, NDNT, no masses noted, no organomegaly noted MSK: No e/c/c Skin: No rashes, bruises, ulcerations noted Neuro: A&O x 3, no focal deficits Psyc: Appropriate interaction and affect, calm/cooperative  A/P: Acute respiratory failure with hypoxia COVID+ (recent admission)      - requiring 100% nonrebreather at this time to maintain sats more than 90%     - recent Covid infection suspect likely related to worsening pulmonary changes to Covid.       - Pt's procal is 0.13 w/CXR showing persistent and worsening infiltrates     - on IV steroids, empiric abx     - follow Bld Cx, ABG is ok     - add combivent     - rpt procal in AM, stop abx if negative  ESRD on hemodialysis on Monday Wednesday Friday     - Per report, pt had complete session yesterday.     - nephro consulted  Chronic pain     - hold pain medication d/t lethargy  DMt2     - continue SSI, home insulin, glucose checks  HTN      - continue metoprolol.  Thrombocytopenia     - secondary to infection? follow

## 2020-02-27 DIAGNOSIS — U071 COVID-19: Principal | ICD-10-CM

## 2020-02-27 DIAGNOSIS — G894 Chronic pain syndrome: Secondary | ICD-10-CM

## 2020-02-27 LAB — CBC WITH DIFFERENTIAL/PLATELET
Abs Immature Granulocytes: 0.07 10*3/uL (ref 0.00–0.07)
Basophils Absolute: 0 10*3/uL (ref 0.0–0.1)
Basophils Relative: 0 %
Eosinophils Absolute: 0.2 10*3/uL (ref 0.0–0.5)
Eosinophils Relative: 3 %
HCT: 23.8 % — ABNORMAL LOW (ref 36.0–46.0)
Hemoglobin: 7.8 g/dL — ABNORMAL LOW (ref 12.0–15.0)
Immature Granulocytes: 1 %
Lymphocytes Relative: 18 %
Lymphs Abs: 1.4 10*3/uL (ref 0.7–4.0)
MCH: 32.5 pg (ref 26.0–34.0)
MCHC: 32.8 g/dL (ref 30.0–36.0)
MCV: 99.2 fL (ref 80.0–100.0)
Monocytes Absolute: 0.7 10*3/uL (ref 0.1–1.0)
Monocytes Relative: 9 %
Neutro Abs: 5.4 10*3/uL (ref 1.7–7.7)
Neutrophils Relative %: 69 %
Platelets: 155 10*3/uL (ref 150–400)
RBC: 2.4 MIL/uL — ABNORMAL LOW (ref 3.87–5.11)
RDW: 13.9 % (ref 11.5–15.5)
WBC: 7.8 10*3/uL (ref 4.0–10.5)
nRBC: 0 % (ref 0.0–0.2)

## 2020-02-27 LAB — GLUCOSE, CAPILLARY
Glucose-Capillary: 161 mg/dL — ABNORMAL HIGH (ref 70–99)
Glucose-Capillary: 113 mg/dL — ABNORMAL HIGH (ref 70–99)
Glucose-Capillary: 264 mg/dL — ABNORMAL HIGH (ref 70–99)
Glucose-Capillary: 304 mg/dL — ABNORMAL HIGH (ref 70–99)

## 2020-02-27 LAB — CBG MONITORING, ED: Glucose-Capillary: 423 mg/dL — ABNORMAL HIGH (ref 70–99)

## 2020-02-27 LAB — COMPREHENSIVE METABOLIC PANEL
ALT: 16 U/L (ref 0–44)
AST: 12 U/L — ABNORMAL LOW (ref 15–41)
Albumin: 3.1 g/dL — ABNORMAL LOW (ref 3.5–5.0)
Alkaline Phosphatase: 66 U/L (ref 38–126)
Anion gap: 17 — ABNORMAL HIGH (ref 5–15)
BUN: 90 mg/dL — ABNORMAL HIGH (ref 6–20)
CO2: 25 mmol/L (ref 22–32)
Calcium: 9 mg/dL (ref 8.9–10.3)
Chloride: 92 mmol/L — ABNORMAL LOW (ref 98–111)
Creatinine, Ser: 8.09 mg/dL — ABNORMAL HIGH (ref 0.44–1.00)
GFR, Estimated: 5 mL/min — ABNORMAL LOW (ref >60)
Glucose, Bld: 242 mg/dL — ABNORMAL HIGH (ref 70–99)
Potassium: 4.9 mmol/L (ref 3.5–5.1)
Sodium: 134 mmol/L — ABNORMAL LOW (ref 135–145)
Total Bilirubin: 0.8 mg/dL (ref 0.3–1.2)
Total Protein: 6.4 g/dL — ABNORMAL LOW (ref 6.5–8.1)

## 2020-02-27 LAB — C-REACTIVE PROTEIN: CRP: 6.2 mg/dL — ABNORMAL HIGH (ref <1.0)

## 2020-02-27 LAB — D-DIMER, QUANTITATIVE: D-Dimer, Quant: 2.95 ug/mL-FEU — ABNORMAL HIGH (ref 0.00–0.50)

## 2020-02-27 LAB — PROCALCITONIN: Procalcitonin: 0.19 ng/mL

## 2020-02-27 MED ORDER — METOPROLOL TARTRATE 12.5 MG HALF TABLET
12.5000 mg | ORAL_TABLET | Freq: Two times a day (BID) | ORAL | Status: DC
  Administered 2020-02-27 (×2): 12.5 mg via ORAL
  Filled 2020-02-27 (×2): qty 1

## 2020-02-27 MED ORDER — DARBEPOETIN ALFA 60 MCG/0.3ML IJ SOSY: 60.0000 ug | PREFILLED_SYRINGE | Freq: Once | INTRAMUSCULAR | Status: DC

## 2020-02-27 MED ORDER — INSULIN ASPART 100 UNIT/ML ~~LOC~~ SOLN
0.0000 [IU] | Freq: Three times a day (TID) | SUBCUTANEOUS | Status: DC
  Administered 2020-02-27: 7 [IU] via SUBCUTANEOUS
  Administered 2020-02-28: 3 [IU] via SUBCUTANEOUS
  Administered 2020-02-28: 2 [IU] via SUBCUTANEOUS

## 2020-02-27 MED ORDER — INSULIN ASPART 100 UNIT/ML ~~LOC~~ SOLN
12.0000 [IU] | Freq: Once | SUBCUTANEOUS | Status: AC
  Administered 2020-02-27: 12 [IU] via SUBCUTANEOUS

## 2020-02-27 MED ORDER — FERRIC CITRATE 1 GM 210 MG(FE) PO TABS
1050.0000 mg | ORAL_TABLET | Freq: Three times a day (TID) | ORAL | Status: DC
  Administered 2020-02-27 – 2020-02-28 (×3): 1050 mg via ORAL
  Filled 2020-02-27 (×4): qty 5

## 2020-02-27 MED ORDER — FERRIC CITRATE 1 GM 210 MG(FE) PO TABS
630.0000 mg | ORAL_TABLET | ORAL | Status: DC | PRN
  Filled 2020-02-27: qty 3

## 2020-02-27 MED ORDER — SODIUM CHLORIDE 0.9 % IV SOLN: 2.0000 g | Freq: Once | INTRAVENOUS | Status: DC

## 2020-02-27 MED ORDER — DARBEPOETIN ALFA 60 MCG/0.3ML IJ SOSY
60.0000 ug | PREFILLED_SYRINGE | INTRAMUSCULAR | Status: DC
  Filled 2020-02-27: qty 0.3

## 2020-02-27 MED ORDER — IPRATROPIUM-ALBUTEROL 20-100 MCG/ACT IN AERS
1.0000 | INHALATION_SPRAY | Freq: Two times a day (BID) | RESPIRATORY_TRACT | Status: DC
  Administered 2020-02-27 – 2020-02-28 (×2): 1 via RESPIRATORY_TRACT

## 2020-02-27 MED ORDER — DARBEPOETIN ALFA 60 MCG/0.3ML IJ SOSY: 60.0000 ug | PREFILLED_SYRINGE | INTRAMUSCULAR | Status: DC

## 2020-02-27 MED ORDER — CHLORHEXIDINE GLUCONATE CLOTH 2 % EX PADS
6.0000 | MEDICATED_PAD | Freq: Every day | CUTANEOUS | Status: DC
  Administered 2020-02-28: 6 via TOPICAL

## 2020-02-27 MED ORDER — CHLORHEXIDINE GLUCONATE CLOTH 2 % EX PADS: 6.0000 | MEDICATED_PAD | Freq: Every day | CUTANEOUS | Status: DC

## 2020-02-27 MED ORDER — VANCOMYCIN HCL IN DEXTROSE 1-5 GM/200ML-% IV SOLN: 1000.0000 mg | Freq: Once | INTRAVENOUS | Status: DC

## 2020-02-27 NOTE — Progress Notes (Signed)
HOSPITAL MEDICINE OVERNIGHT EVENT NOTE    Notified by nursing that patient has fallen out of bed.  No loss of consciousness or change in sensorium that precipitated the fall.  Patient is not complaining of any pain and there is no visible sign of injury according to nursing.  No further investigative work-up necessary at this time we will continue to monitor and consider testing if patient begins to exhibit any symptoms of  pain or confusion.  Catherine Emerald  MD [facility]

## 2020-02-27 NOTE — Plan of Care (Signed)
  Problem: Education: Goal: Knowledge of risk factors and measures for prevention of condition will improve Outcome: Progressing   Problem: Coping: Goal: Psychosocial and spiritual needs will be supported Outcome: Progressing   Problem: Respiratory: Goal: Will maintain a patent airway Outcome: Progressing Goal: Complications related to the disease process, condition or treatment will be avoided or minimized Outcome: Progressing   

## 2020-02-27 NOTE — ED Notes (Signed)
Contacted Debra on [address] awaiting for recall for per [place] room being cleaned.

## 2020-02-27 NOTE — Progress Notes (Incomplete)
HEMATOLOGY/ONCOLOGY CONSULTATION NOTE  Date of Service: 02/27/2020  Patient Care Team: Sandi Mariscal, MD as PCP - General (Internal Medicine) Minus Breeding, MD as PCP - Cardiology (Cardiology) Leandrew Koyanagi, MD as Attending Physician (Orthopedic Surgery) [facility]  CHIEF COMPLAINTS/PURPOSE OF CONSULTATION:  Lymphadenopathy/Enlarged Spleen/Splenic Lesions  HISTORY OF PRESENTING ILLNESS:   I connected with Catherine [name] [name] on 02/27/20 at 10:20 AM EST and verified that I am speaking with Catherine [name] correct person using two identifiers.   I discussed Catherine [name] limitations, risks, security and privacy concerns of performing an evaluation and management service by telemedicine and Catherine [name] availability of in-person appointments. I also discussed with Catherine [name] patient that there may be Catherine [name] patient responsible charge related to this service. Catherine [name] patient expressed understanding and agreed to proceed.   Other persons participating in Catherine [name] visit and their role in Catherine [name] encounter:     -Dawayne Cirri, Medical Scribe   Patient's location: Home  Provider's location: [place] [facility] [place] is Catherine [name] wonderful 55 y.o. [name] who has been referred to [place] by Dr. Harrie Jeans for evaluation and management of lymphadenopathy/enlarged spleen/splenic lesions. Catherine [name] patient's last visit with [place] was on 07/16/19. Catherine [name] [name] reports that she is doing well overall.  Catherine [name] [name] reports she is good. She still feels fine. [name] has been off of her RA treatment for Catherine [name] while now. She is seeing her urologist and has f/u's with them in October.   Of note since Catherine [name] patient's last visit, [name] has had Surgical Pathology [id number]) completed on 07/16/19 with results revealing "No monoclonal B-cell population. No aberrant T-cell population. No significant increase in large granular lymphocytes. " [name] had PET Skull Base to Thigh ([id number]) completed on 08/15/19 with results revealing "1. 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 2. Catherine [name] splenic masses have activity similar to Catherine [name] rest of Catherine [name] spleen. These could be hamartomas, hemangiomas, or similar benign lesions. Lymphoma seems less likely given Catherine [name] lack of abnormal activity, but surveillance may be warranted. 3. No compelling findings of hypermetabolic adenopathy to further bolster Catherine [name] case for lymphoma. 4. Hazy patchy ground-glass opacities in Catherine [name] lungs are nonspecific but could be from extrinsic allergic alveolitis, hypersensitivity pneumonitis, or atypical pneumonia. There is cardiomegaly and low-grade edema is Catherine [name] differential diagnostic consideration. 5. Pelvic mass with fatty and calcific elements. This likely represents fibroids and Catherine [name] uterine lipoma, less likely dermoid. 6. Gas and fluid in Catherine [name] right upper extremity subcutaneous tissues near Catherine [name] stent, correlate with any recent surgical intervention. 7. Other imaging findings of potential clinical significance: Aortic Atherosclerosis (ICD10-I70.0). Coronary atherosclerosis. Moderate cardiomegaly. Infraumbilical hernia containing loops of bowel without strangulation."  Lab results today (07/16/19) of CBC w/diff and CMP is as follows: all values are WNL except for RBC at 3.10, Hemoglobin at 11.2, HCT at 33, Chloride at 94, Glucose at 265, BUN at 50, Creatinine at 7.70, GFR, Est Non Af Am at 6, GFR, Est AFR Am at 7, Anion Gap at 18 07/16/19 of Ferritin at 1553 07/16/19 of Sedimentation Rate at 106 07/16/19 of Rheumatoid Factor at 316.7 07/16/19 of LDH at 222 07/16/19 of MMP is as follows: all values are WNL except for IgA at 447, Alpha2 Glob SerPl Elph-Mcnc at 1., Globulin, total at 4.0  On review of systems, [name] reports edema and denies fevers, chills, night sweats and any other symptoms.    Interval History Catherine [name] [name]  is Catherine [name] wonderful 55 y.o. [name] who is here today for evaluation and management of lymphadenopathy/enlarged  spleen/splenic lesions. Catherine [name] patient's last visit with [place] was on 08/15/2020. Catherine [name] [name] reports that she is doing well overall.  Catherine [name] [name] reports ***  Lab results today 02/28/2020 of CBC w/diff and CMP is as follows: all values are WNL except for ***  On review of systems, [name] reports *** and denies *** and any other symptoms.   MEDICAL HISTORY:  Past Medical History:  Diagnosis Date  . Anemia   . Anxiety   . Arthritis    "knees" "hands", "RA"  . CAD (coronary artery disease)    Nonobstructive on CT 2019  . CHF (congestive heart failure) ([place])   . COVID-19 10/2018  . Dyspnea    "when I have too much fluid."  . ESRD (end stage renal disease) ([place])    Dialysis [facility]THSat- 3rd st  . Headache(784.0)   . Heart murmur   . High cholesterol   . History of blood transfusion   . Hypertension   . Mitral regurgitation    moderate to severe MR 10/2018 echo  . 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 Pericardial effusion   . Pneumonia    ; 11/16/2018- "touch of pneumonia" - seen in ED- 11/14/2018- on antibiotic. Has had it x 2  . PVD (peripheral vascular disease) ([place])    Right leg stent in [place].  (No records)  . Restless legs   . Sleep apnea    does not use Cpap  . Thoracic ascending aortic aneurysm (H[facility])    4.4 cm 11/14/18 CTA  . Type II diabetes mellitus ([place])     SURGICAL HISTORY: Past Surgical History:  Procedure Laterality Date  . AMPUTATION Left 12/27/2018   Procedure: LEFT THUMB REVISION AMPUTATION DIGIT;  Surgeon: Charlotte Crumb, MD;  Location: [facility];  Service: Orthopedics;  Laterality: Left;  . AV FISTULA PLACEMENT Left   . AV FISTULA PLACEMENT Right 03/12/2019   Procedure: INSERTION OF ARTERIOVENOUS (AV) GORE-TEX GRAFT ARM;  Surgeon: Elam Dutch, MD;  [name];  Service: Vascular;  Laterality: Right;  . AV FISTULA PLACEMENT Right 08/13/2019   Procedure: RIGHT UPPER ARM ARTERIOVENOUS (AV) FISTULA CREATION WITH BASILIC VEIN;  Surgeon:  Elam Dutch, MD;  Location: [facility];  Service: Vascular;  Laterality: Right;  . [place]; 1997   x 2  . COLONOSCOPY    . EYE SURGERY Bilateral    cataract surgery  . INSERTION OF DIALYSIS CATHETER Right 09/26/2018   Procedure: INSERTION OF DIALYSIS CATHETER Right Internal Jugular.;  Surgeon: Elam Dutch, MD;  Location: [facility];  Service: Vascular;  Laterality: Right;  . IR FLUORO GUIDE CV LINE RIGHT  05/04/2019  . IR US GUIDE VASC ACCESS RIGHT  05/04/2019  . LIGATION OF ARTERIOVENOUS  FISTULA Left 09/26/2018   Procedure: LIGATION OF ARTERIOVENOUS  FISTULA LEFT ARM;  Surgeon: Elam Dutch, MD;  Location: [facility];  Service: Vascular;  Laterality: Left;  . PERICARDIAL WINDOW  02/2004   for pericardial effusion  . PERIPHERAL VASCULAR INTERVENTION Right 10/26/2019   Procedure: PERIPHERAL VASCULAR INTERVENTION;  Surgeon: Elam Dutch, MD;  Location: [facility];  Service: Cardiovascular;  Laterality: Right;  arm  AV fistula  . TUBAL LIGATION  05/1995  . UPPER EXTREMITY VENOGRAPHY N/Catherine [name] 06/22/2019   Procedure: UPPER EXTREMITY VENOGRAPHY - Right Central;  Surgeon: Ruta Hinds  E, MD;  Location: [facility];  Service: Cardiovascular;  Laterality: N/Catherine [name];    SOCIAL HISTORY: Social History   Socioeconomic History  . Marital status: Married    Spouse name: Not on file  . Number of children: Not on file  . Years of education: Not on file  . Highest education level: Not on file  Occupational History  . Not on file  Tobacco Use  . Smoking status: Former Smoker    Years: 33.00    Types: Cigarettes    Quit date: 07/17/2019    Years since quitting: 0.6  . Smokeless tobacco: Never Used  . Tobacco comment: using NIcotene gum, rare 1/2 cigarette  Vaping Use  . Vaping Use: Never used  Substance and Sexual Activity  . Alcohol use: No  . Drug use: No  . Sexual activity: Not Currently    Birth control/protection: Post-menopausal  Other Topics Concern  . Not on  file  Social History Narrative   Lives with son.        Social Determinants of Health   Financial Resource Strain: Not on file  Food Insecurity: Not on file  Transportation Needs: Not on file  Physical Activity: Not on file  Stress: Not on file  Social Connections: Not on file  Intimate Partner Violence: Not on file    FAMILY HISTORY: Family History  Problem Relation Age of Onset  . Cancer Brother   . Heart disease Father        Died age 41  . Diabetes Father   . Hyperlipidemia Father   . Hypertension Father   . Stroke Father   . Diabetes Mother   . Hypertension Mother   . Stroke Mother   . Dementia Mother   . Diabetes Sister   . Diabetes Brother   . Hypertension Sister   . Hypertension Brother     ALLERGIES:  is allergic to bupropion.  MEDICATIONS:  No current facility-administered medications for this visit.   No current outpatient medications on file.   Facility-Administered Medications Ordered in Other Visits  Medication Dose Route Frequency Provider Last Rate Last Admin  . acetaminophen (TYLENOL) tablet 650 mg  650 mg Oral Q6H PRN Rise Patience, MD   650 mg at 02/26/20 1213   Or  . acetaminophen (TYLENOL) suppository 650 mg  650 mg Rectal Q6H PRN Rise Patience, MD      . albuterol (VENTOLIN HFA) 108 (90 Base) MCG/ACT inhaler 2 puff  2 puff Inhalation Q6H PRN Rise Patience, MD      . calcitRIOL (ROCALTROL) capsule 0.5 mcg  0.5 mcg Oral Q M,W,F-HD Rise Patience, MD      . ceFEPIme (MAXIPIME) 2 g in sodium chloride 0.9 % 100 mL IVPB  2 g Intravenous Q M,W,F-2000 Laren Everts, RPH      . Chlorhexidine Gluconate Cloth 2 % PADS 6 each  6 each Topical Daily Kyle, Tyrone A, DO      . enoxaparin (LOVENOX) injection 30 mg  30 mg Subcutaneous Q24H Rise Patience, MD   30 mg at 02/26/20 1433  . ferric citrate (AURYXIA) tablet 630-1,050 mg  630-1,050 mg Oral TID with meals Rise Patience, MD   1,050 mg at 02/27/20 0820  .  insulin aspart (novoLOG) injection 0-9 Units  0-9 Units Subcutaneous TID WC Rise Patience, MD   2 Units at 02/27/20 [phone]  . insulin aspart protamine- aspart (NOVOLOG MIX 70/30) injection 25 Units  25 Units Subcutaneous BID WC Kyle, Tyrone A, DO   25 Units at 02/27/20 0834  . Ipratropium-Albuterol (COMBIVENT) respimat 1 puff  1 puff Inhalation BID Marylyn Ishihara, Tyrone A, DO      . methylPREDNISolone sodium succinate (SOLU-MEDROL) 40 mg/mL injection 40 mg  40 mg Intravenous Daily Rise Patience, MD   40 mg at 02/27/20 0827  . metoprolol succinate (TOPROL-XL) 24 hr tablet 100 mg  100 mg Oral Daily Rise Patience, MD   100 mg at 02/27/20 1191  . pramipexole (MIRAPEX) tablet 0.5 mg  0.5 mg Oral Daily Rise Patience, MD   0.5 mg at 02/27/20 4782  . rosuvastatin (CRESTOR) tablet 40 mg  40 mg Oral Daily Rise Patience, MD   40 mg at 02/27/20 9562  . vancomycin (VANCOCIN) IVPB 1000 mg/200 mL premix  1,000 mg Intravenous Q M,W,F-HD Bryk, Veronda P, RPH        REVIEW OF SYSTEMS:   10 Point review of Systems was done is negative except as noted above.  PHYSICAL EXAMINATION: ECOG PERFORMANCE STATUS: 2 - Symptomatic, <50% confined to bed  . There were no vitals filed for this visit. There were no vitals filed for this visit. .There is no height or weight on file to calculate BMI.  *** GENERAL:alert, in no acute distress and comfortable SKIN: no acute rashes, no significant lesions EYES: conjunctiva are pink and non-injected, sclera anicteric OROPHARYNX: MMM, no exudates, no oropharyngeal erythema or ulceration NECK: supple, no JVD LYMPH:  no palpable lymphadenopathy in [name] cervical, axillary or inguinal regions LUNGS: clear to auscultation b/l with normal respiratory effort HEART: regular rate & rhythm ABDOMEN:  normoactive bowel sounds , non tender, not distended. Extremity: no pedal edema PSYCH: alert & oriented x 3 with fluent speech NEURO: no focal motor/sensory  deficits  LABORATORY DATA:  I have reviewed [name] data as listed  . CBC Latest Ref Rng & Units 02/27/2020 02/26/2020 02/26/2020  WBC 4.0 - 10.5 K/uL 7.8 - 8.4  Hemoglobin 12.0 - 15.0 g/dL 7.8(L) 9.2(L) 9.5(L)  Hematocrit 36.0 - 46.0 % 23.8(L) 27.0(L) 28.9(L)  Platelets 150 - 400 K/uL 155 - 173    . CMP Latest Ref Rng & Units 02/27/2020 02/26/2020 02/26/2020  Glucose 70 - 99 mg/dL 242(H) - -  BUN 6 - 20 mg/dL 90(H) - -  Creatinine 0.44 - 1.00 mg/dL 8.09(H) - 6.33(H)  Sodium 135 - 145 mmol/L 134(L) 136 -  Potassium 3.5 - 5.1 mmol/L 4.9 4.9 -  Chloride 98 - 111 mmol/L 92(L) - -  CO2 22 - 32 mmol/L 25 - -  Calcium 8.9 - 10.3 mg/dL 9.0 - -  Total Protein 6.5 - 8.1 g/dL 6.4(L) - -  Total Bilirubin 0.3 - 1.2 mg/dL 0.8 - -  Alkaline Phos 38 - 126 U/L 66 - -  AST 15 - 41 U/L 12(L) - -  ALT 0 - 44 U/L 16 - -     RADIOGRAPHIC STUDIES: I have personally reviewed [facility] [name] radiological images as listed and agreed with Catherine [name] findings in Catherine [name] report. CT ANGIO CHEST PE W OR WO CONTRAST  Result Date: 02/19/2020 CLINICAL DATA:  COVID positive. Acute respiratory failure with hypoxia. EXAM: CT ANGIOGRAPHY CHEST WITH CONTRAST TECHNIQUE: Multidetector CT imaging of Catherine [name] chest was performed using Catherine [name] standard protocol during bolus administration of intravenous contrast. Multiplanar CT image reconstructions and MIPs were obtained to evaluate Catherine [name] vascular anatomy. CONTRAST:  43mL OMNIPAQUE IOHEXOL 350 MG/ML SOLN COMPARISON:  PET-CT August 15, 2019 and CT [name] chest November 14, 2018. FINDINGS: Cardiovascular: Satisfactory opacification of Catherine [name] pulmonary arteries to Catherine [name] mid segmental level. No evidence of pulmonary embolism. Cardiomegaly similar prior. No pericardial effusion. Left upper extremity PICC with tip in Catherine [name] right atrium. Mediastinum/Nodes: No significant change in Catherine [name] prominent/mildly enlarged mediastinal and axillary lymph nodes, for instance Catherine [name] mildly metabolic precarinal lymph node measures 1.5 cm in maximum axial  diameter, and is unchanged when remeasured for consistency (series 5, image 33). 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 Catherine [name] trachea and esophagus are otherwise unremarkable. Lungs/Pleura: Mosaic attenuation with multifocal ground-glass opacities and nodularity. No pleural effusion or pneumothorax. Upper Abdomen: No right upper pole renal neoplasm incompletely visualized on today's exam. Musculoskeletal: Multilevel degenerative changes spine. No suspicious lytic or blastic lesion of bone. Review of Catherine [name] MIP images confirms Catherine [name] above findings. IMPRESSION: 1. No evidence of pulmonary embolism. 2. Mosaic attenuation with multifocal ground-glass opacities and nodularity, which may represent multifocal pneumonia or edema. 3. No significant change in Catherine [name] prominent/mildly enlarged mediastinal and axillary lymph nodes, which may be reactive. 4. 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 Electronically Signed   By: Dahlia Bailiff MD   On: 02/19/2020 11:19   DG Chest Port 1 View  Result Date: 02/25/2020 CLINICAL DATA:  COVID, shortness of breath EXAM: PORTABLE CHEST 1 VIEW COMPARISON:  02/18/2020 FINDINGS: Right dialysis catheter remains in place, unchanged. Cardiomegaly. Bilateral airspace opacities are again noted, similar on Catherine [name] right to prior study, slightly increased in Catherine [name] left mid lung. No effusions or pneumothorax. IMPRESSION: Bilateral airspace disease, slightly worsening on Catherine [name] left since prior study. Stable cardiomegaly. Electronically Signed   By: Rolm Baptise M.D.   On: 02/25/2020 22:28   DG Chest Portable 1 View  Result Date: 02/18/2020 CLINICAL DATA:  Shortness of breath.  History of urologic carcinoma EXAM: PORTABLE CHEST 1 VIEW COMPARISON:  November 14, 2018 chest radiograph; PET-CT August 15, 2019. FINDINGS: Central  catheter tip is in Catherine [name] right atrium. No pneumothorax. There is cardiomegaly with pulmonary venous hypertension. Small left pleural effusion noted. There is diffuse interstitial thickening, likely indicative of pulmonary edema. No consolidation. There is Catherine [name] nodular lesion in Catherine [name] left mid lung measuring 1.2 x 1.1 cm. There is Catherine [name] questionable nodular area overlying Catherine [name] inferior right hilum measuring 2.3 x 2.1 cm. No appreciable adenopathy by radiography.  No bone lesions. IMPRESSION: 1. Well-defined nodular opacity in left perihilar region measuring 1.2 x 1.1 cm. Questionable less well-defined nodular opacity right lower lung region medially measuring 2.3 x 2.1 cm. These findings raise concern for metastatic foci. Correlation with chest CT, ideally with intravenous contrast, advised to further evaluate. 2. Cardiomegaly with pulmonary vascular congestion. Diffuse interstitial prominence likely represents interstitial pulmonary edema. Suspect Catherine [name] degree of congestive heart failure. Note that lymphangitic spread of tumor potentially could present in this manner. CT may be helpful for further delineation in this regard. 3.  Central catheter tip in right atrium.  No pneumothorax. Electronically Signed   By: Lowella Grip III M.D.   On: 02/18/2020 12:10   VAS [place] LOWER EXTREMITY VENOUS (DVT)  Result Date: 02/19/2020  Lower Venous DVT Study Indications: Swelling, and Edema.  Comparison Study: no prior Performing Technologist: Abram Sander RVS  Examination Guidelines: [name] complete evaluation includes B-mode imaging, spectral Doppler, color Doppler, and power Doppler as  needed of all accessible portions of each vessel. Bilateral testing is considered an integral part of Catherine [name] complete examination. Limited examinations for reoccurring indications may be performed as noted. Catherine [name] reflux portion of Catherine [name] exam is performed with Catherine [name] patient in reverse Trendelenburg.   +---------+---------------+---------+-----------+----------+--------------+ RIGHT    CompressibilityPhasicitySpontaneityPropertiesThrombus Aging +---------+---------------+---------+-----------+----------+--------------+ CFV      Full           Yes      Yes                                 +---------+---------------+---------+-----------+----------+--------------+ SFJ      Full                                                        +---------+---------------+---------+-----------+----------+--------------+ FV Prox  Full                                                        +---------+---------------+---------+-----------+----------+--------------+ FV Mid   Full                                                        +---------+---------------+---------+-----------+----------+--------------+ FV Distal               Yes      Yes                                 +---------+---------------+---------+-----------+----------+--------------+ PFV      Full                                                        +---------+---------------+---------+-----------+----------+--------------+ POP      Full           Yes      Yes                                 +---------+---------------+---------+-----------+----------+--------------+ PTV      Full                                                        +---------+---------------+---------+-----------+----------+--------------+ PERO     Full                                                        +---------+---------------+---------+-----------+----------+--------------+   +---------+---------------+---------+-----------+----------+-------------------+ LEFT  CompressibilityPhasicitySpontaneityPropertiesThrombus Aging      +---------+---------------+---------+-----------+----------+-------------------+ CFV      Full           Yes      Yes                                       +---------+---------------+---------+-----------+----------+-------------------+ SFJ      Full                                                             +---------+---------------+---------+-----------+----------+-------------------+ FV Prox  Full                                                             +---------+---------------+---------+-----------+----------+-------------------+ FV Mid   Full                                                             +---------+---------------+---------+-----------+----------+-------------------+ FV Distal               Yes      Yes                                      +---------+---------------+---------+-----------+----------+-------------------+ PFV      Full                                                             +---------+---------------+---------+-----------+----------+-------------------+ POP      Full           Yes      Yes                                      +---------+---------------+---------+-----------+----------+-------------------+ PTV      Full                                                             +---------+---------------+---------+-----------+----------+-------------------+ PERO                                                  Not well visualized +---------+---------------+---------+-----------+----------+-------------------+     Summary: BILATERAL: - No evidence of deep vein thrombosis seen in [name] lower  extremities, bilaterally. - No evidence of superficial venous thrombosis in [name] lower extremities, bilaterally. -No evidence of popliteal cyst, bilaterally.   *See table(s) above for measurements and observations. Electronically signed by Deitra Mayo MD on 02/19/2020 at 10:22:57 AM.    Final     ASSESSMENT & PLAN:   54 yo with   1) Axillary and mediastinal LNadenopathy 2) Splenic lesions R/o Lymphoma 3) Rt kidney mass ? RCC  PLAN: -Discussed [name] labwork today,  ***  -Will see back in ***  FOLLOW UP: ***    Catherine [name] total time spent in [name] appointment was *** minutes and more than 50% was on counseling and direct patient cares.  All of Catherine [name] patient's questions were answered with apparent satisfaction. Catherine [name] patient knows to call [name] clinic with any problems, questions or concerns.  Sullivan Lone MD [place] AAHIVMS [facility] Hematology/Oncology Physician [facility]  (Office):       [phone] (Work cell):  [phone] (Fax):           [phone]  02/27/2020 11:41 AM  I, Reinaldo Raddle, am acting as scribe for Dr. Sullivan Lone, MD.

## 2020-02-27 NOTE — Progress Notes (Signed)
[facility] Progress Note  Subjective: Asked to see for this patient readmitted for SOB. Pt was last seen by [place] here on 1/20 w/ COVID infection and superimposed vol overload/ IS edema which improved w/ dialysis. Pt was dc'd on 1/21. She presented to ED w/ SOB and hypoxia, low sat 70-80's on RA. She had just completed her HD on 1/24. CXR showed IS edema , similar to prior. Asked to see for dialysis.   Vitals:   02/27/20 0253 02/27/20 0843 02/27/20 1142 02/27/20 1340  BP: (!) 154/63 105/64 118/64   Pulse: 95 75 65   Resp: 20  20   Temp: 97.6 F (36.4 C)  98.1 F (36.7 C)   TempSrc: Oral  Oral   SpO2: 98% 98% 100%   Weight:   116.5 kg 116.5 kg  Height: 5\' 8"  (1.727 m)       Exam:   alert, nad , nasal O2 4-5 L   no jvd  Chest mostly clear, no rales or wheezing  Cor reg no RG  Abd soft ntnd no ascites   Ext 1-2 + bilat LE edema   Alert, NF, ox3   R IJ TDC    OP HD: GO MWF   4h 53min  2/2 bath 108.5kg   TDC  Hep 10,000  - calc 0.5 ug po tiw   Assessment/ Plan: 1. SOB / hypoxemia - CXR w/ IS edema, suspect vol overload which is a long-term problem for this patient. Pt is not in distress, plan HD later tonight or tomorrow at the latest. Fluid restriction. Lower BP meds.  2. ESRD - MWF HD. HD as above.   3. COVID +infection - per pmd 4. DM on insulin  5. HTN/volume -  Will lower/ change metop xl to 12.5 mg bid due to low BP's and vol overload/ need BP for UF on HD. 8kg up by standing wt today.  6. Chronic pain - on Oxy IR 7. Anemia ckd - Hb 7-8 range, will start darbe 60 ug weekly  8. MBD ckd - auryxia 4 ac tid, po vdra w/ hd   Rob Mariska Daffin 02/27/2020, 2:50 PM   Recent Labs  Lab 02/26/20 0004 02/26/20 0545 02/26/20 0807 02/27/20 0656  K 4.0  --  4.9 4.9  BUN 50*  --   --  90*  CREATININE 5.55* 6.33*  --  8.09*  CALCIUM 8.6*  --   --  9.0  HGB 13.0 9.5* 9.2* 7.8*   Inpatient medications: . calcitRIOL  0.5 mcg Oral Q M,W,F-HD  . Chlorhexidine  Gluconate Cloth  6 each Topical Daily  . [START ON 02/28/2020] Chlorhexidine Gluconate Cloth  6 each Topical Q0600  . enoxaparin (LOVENOX) injection  30 mg Subcutaneous Q24H  . ferric citrate  1,050 mg Oral TID WC  . insulin aspart  0-9 Units Subcutaneous TID WC  . insulin aspart protamine- aspart  25 Units Subcutaneous BID WC  . Ipratropium-Albuterol  1 puff Inhalation BID  . methylPREDNISolone (SOLU-MEDROL) injection  40 mg Intravenous Daily  . metoprolol succinate  100 mg Oral Daily  . pramipexole  0.5 mg Oral Daily  . rosuvastatin  40 mg Oral Daily   . ceFEPime (MAXIPIME) IV    . vancomycin     acetaminophen **OR** acetaminophen, albuterol, ferric citrate

## 2020-02-27 NOTE — Progress Notes (Signed)
[name], RN called and notified the pt's HD tx has been moved 02/28/2020.

## 2020-02-27 NOTE — Progress Notes (Signed)
PROGRESS NOTE    [name]  WIO:[id number] DOB: 1965/06/18 DOA: 02/25/2020 PCP: Sandi Mariscal, MD    Brief Narrative:  55 yo F w/ Hx of ESRD on HD, DM2, HTN. Presenting with acute hypoxic respiratory failure. pMN admission. See H&P for full details. She has been weaned to 3-4L [place]. Vitals appear stable. She is feeling better overall. Procal is only 0.13. Rpt procal in AM, if it remains low, d/c abx. Continue steroids for now. Nephro consulted  Assessment & Plan:   Principal Problem:   Acute respiratory failure with hypoxia (HCC) Active Problems:   Hypertension   Type II diabetes mellitus with complication, uncontrolled (HCC)   ESRD on dialysis (HCC)   Chronic pain disorder   End-stage renal disease on hemodialysis ([place])  Acute respiratory failure with hypoxia COVID+ (recent admission)      - Initially required 100% nonrebreather at this time to maintain sats more than 90%     - recent Covid infection suspect likely related to worsening pulmonary changes to Covid.       - CXR showing persistent and worsening infiltrate     - on IV steroids, empiric abx     - follow Bld Cx, ABG is ok     - add combivent     - Procal is consistently <0.25  ESRD on hemodialysis on Monday Wednesday Friday     - Per report, pt had complete session on Monday     - nephro consulted, now plan for HD 1/27  Chronic pain     - Initially held pain medication d/t lethargy  DMt2     - continue SSI, home insulin, glucose checks  HTN      - continue metoprolol.  Thrombocytopenia     - secondary to infection? Follow -Repeat CBC in AM  DVT prophylaxis: Lovenox Code Status: Full Family Communication: Pt in room, family not at bedside  Status is: Inpatient  Remains inpatient appropriate because:Inpatient level of care appropriate due to severity of illness   Dispo: The patient is from: Home              Anticipated d/c is to: Home              Anticipated d/c date is: 3 days               Patient currently is not medically stable to d/c.   Difficult to place patient No   Consultants:   Nephrology  Procedures:     Antimicrobials: Anti-infectives (From admission, onward)   Start     Dose/Rate Route Frequency Ordered Stop   02/27/20 2000  ceFEPIme (MAXIPIME) 2 g in sodium chloride 0.9 % 100 mL IVPB        2 g 200 mL/hr over 30 Minutes Intravenous Every M-W-F (2000) 02/26/20 0530     02/27/20 1200  vancomycin (VANCOCIN) IVPB 1000 mg/200 mL premix        1,000 mg 200 mL/hr over 60 Minutes Intravenous Every M-W-F (Hemodialysis) 02/26/20 0530     02/26/20 0600  vancomycin (VANCOCIN) 2,500 mg in sodium chloride 0.9 % 500 mL IVPB        2,500 mg 250 mL/hr over 120 Minutes Intravenous  Once 02/26/20 0530 02/26/20 0702   02/26/20 0545  ceFEPIme (MAXIPIME) 2 g in sodium chloride 0.9 % 100 mL IVPB        2 g 200 mL/hr over 30 Minutes Intravenous  Once 02/26/20 0530 02/26/20 0645  Subjective: Without complaints this AM  Objective: Vitals:   02/27/20 0253 02/27/20 0843 02/27/20 1142 02/27/20 1340  BP: (!) 154/63 105/64 118/64   Pulse: 95 75 65   Resp: 20  20   Temp: 97.6 F (36.4 C)  98.1 F (36.7 C)   TempSrc: Oral  Oral   SpO2: 98% 98% 100%   Weight:   116.5 kg 116.5 kg  Height: 5\' 8"  (1.727 m)       Intake/Output Summary (Last 24 hours) at 02/27/2020 1935 Last data filed at 02/27/2020 4503 Gross per 24 hour  Intake 600 ml  Output --  Net 600 ml   Filed Weights   02/27/20 0247 02/27/20 1142 02/27/20 1340  Weight: 116 kg 116.5 kg 116.5 kg    Examination:  General exam: Appears calm and comfortable  Respiratory system: No audible wheezing Respiratory effort normal. Cardiovascular system: perfused, no notable JVD Gastrointestinal system: Abdomen is nondistended, soft and nontender. No organomegaly or masses felt. Normal bowel sounds heard. Central nervous system: Alert and oriented. No focal neurological deficits. Extremities: Symmetric 5 x 5  power. Skin: No rashes, lesions Psychiatry: Judgement and insight appear normal. Mood & affect appropriate.   Data Reviewed: I have personally reviewed following labs and imaging studies  CBC: Recent Labs  Lab 02/21/20 0514 02/26/20 0004 02/26/20 0545 02/26/20 0807 02/27/20 0656  WBC 7.3 5.5 8.4  --  7.8  NEUTROABS 5.8 3.6  --   --  5.4  HGB 9.3* 13.0 9.5* 9.2* 7.8*  HCT 27.5* 39.9 28.9* 27.0* 23.8*  MCV 97.5 98.5 100.3*  --  99.2  PLT 170 112* 173  --  888   Basic Metabolic Panel: Recent Labs  Lab 02/21/20 0514 02/26/20 0004 02/26/20 0545 02/26/20 0807 02/27/20 0656  NA 136 138  --  136 134*  K 4.7 4.0  --  4.9 4.9  CL 94* 96*  --   --  92*  CO2 24 29  --   --  25  GLUCOSE 207* 85  --   --  242*  BUN 60* 50*  --   --  90*  CREATININE 6.30* 5.55* 6.33*  --  8.09*  CALCIUM 10.2 8.6*  --   --  9.0  MG 2.3  --   --   --   --    GFR: Estimated Creatinine Clearance: 10.5 mL/min (A) (by C-G formula based on SCr of 8.09 mg/dL (H)). Liver Function Tests: Recent Labs  Lab 02/21/20 0514 02/26/20 0004 02/27/20 0656  AST 21 14* 12*  ALT 20 17 16   ALKPHOS 71 76 66  BILITOT 0.5 0.8 0.8  PROT 7.9 7.4 6.4*  ALBUMIN 3.6 3.2* 3.1*   No results for input(s): LIPASE, AMYLASE in the last 168 hours. No results for input(s): AMMONIA in the last 168 hours. Coagulation Profile: No results for input(s): INR, PROTIME in the last 168 hours. Cardiac Enzymes: No results for input(s): CKTOTAL, CKMB, CKMBINDEX, TROPONINI in the last 168 hours. BNP (last 3 results) No results for input(s): PROBNP in the last 8760 hours. HbA1C: No results for input(s): HGBA1C in the last 72 hours. CBG: Recent Labs  Lab 02/26/20 1625 02/27/20 0223 02/27/20 0825 02/27/20 1139 02/27/20 1558  GLUCAP 440* 423* 161* 113* 264*   Lipid Profile: Recent Labs    02/26/20 0004  TRIG 122   Thyroid Function Tests: No results for input(s): TSH, T4TOTAL, FREET4, T3FREE, THYROIDAB in the last 72  hours. Anemia Panel: Recent Labs  02/26/20 0004  FERRITIN 1,323*   Sepsis Labs: Recent Labs  Lab 02/21/20 0514 02/26/20 0004 02/26/20 0232 02/27/20 0656  PROCALCITON 0.15 0.13  --  0.19  LATICACIDVEN  --  1.1 1.0  --     Recent Results (from the past 240 hour(s))  Resp Panel by RT-PCR (Flu A&B, Covid) Nasopharyngeal Swab     Status: Abnormal   Collection Time: 02/18/20 12:48 PM   Specimen: Nasopharyngeal Swab; Nasopharyngeal(NP) swabs in vial transport medium  Result Value Ref Range Status   SARS Coronavirus 2 by RT PCR POSITIVE (A) NEGATIVE Final    Comment: emailed L. Berdik RN 14:40 02/18/20 ([email or web address]) (NOTE) SARS-CoV-2 target nucleic acids are DETECTED.  The SARS-CoV-2 RNA is generally detectable in upper respiratory specimens during the acute phase of infection. Positive results are indicative of the presence of the identified virus, but do not rule out bacterial infection or co-infection with other pathogens not detected by the test. Clinical correlation with patient history and other diagnostic information is necessary to determine patient infection status. The expected result is Negative.  Fact Sheet for Patients: EntrepreneurPulse.com.au  Fact Sheet for Healthcare Providers: IncredibleEmployment.be  This test is not yet approved or cleared by the [place] FDA and  has been authorized for detection and/or diagnosis of SARS-CoV-2 by FDA under an Emergency Use Authorization (EUA).  This EUA will remain in effect (meaning this test can be used) for the duration of  the COVID-1 9 declaration under Section 564(b)(1) of the Act, 21 U.S.C. section 360bbb-3(b)(1), unless the authorization is terminated or revoked sooner.     Influenza A by PCR NEGATIVE NEGATIVE Final   Influenza B by PCR NEGATIVE NEGATIVE Final    Comment: (NOTE) The Xpert Xpress SARS-CoV-2/FLU/RSV plus assay is intended as an aid in the diagnosis of  influenza from Nasopharyngeal swab specimens and should not be used as a sole basis for treatment. Nasal washings and aspirates are unacceptable for Xpert Xpress SARS-CoV-2/FLU/RSV testing.  Fact Sheet for Patients: EntrepreneurPulse.com.au  Fact Sheet for Healthcare Providers: IncredibleEmployment.be  This test is not yet approved or cleared by the [place] FDA and has been authorized for detection and/or diagnosis of SARS-CoV-2 by FDA under an Emergency Use Authorization (EUA). This EUA will remain in effect (meaning this test can be used) for the duration of the COVID-19 declaration under Section 564(b)(1) of the Act, 21 U.S.C. section 360bbb-3(b)(1), unless the authorization is terminated or revoked.  Performed at [facility], [place] [address] [place], [place] [address]   MRSA PCR Screening     Status: None   Collection Time: 02/19/20 10:52 AM   Specimen: Nasal Mucosa; Nasopharyngeal  Result Value Ref Range Status   MRSA by PCR NEGATIVE NEGATIVE Final    Comment:        The GeneXpert MRSA Assay (FDA approved for NASAL specimens only), is one component of a comprehensive MRSA colonization surveillance program. It is not intended to diagnose MRSA infection nor to guide or monitor treatment for MRSA infections. Performed at [facility], [place] [address], [place],  [address]   Blood Culture (routine x 2)     Status: None (Preliminary result)   Collection Time: 02/26/20 12:10 AM   Specimen: BLOOD  Result Value Ref Range Status   Specimen Description BLOOD SITE NOT SPECIFIED  Final   Special Requests   Final    BOTTLES DRAWN AEROBIC AND ANAEROBIC Blood Culture adequate volume   Culture   Final  NO GROWTH 1 DAY Performed at [facility], [place] [address], [place], [place] [address]    Report Status PENDING  Incomplete  Blood Culture (routine x 2)     Status: None (Preliminary result)   Collection Time:  02/26/20  2:20 AM   Specimen: BLOOD LEFT ARM  Result Value Ref Range Status   Specimen Description BLOOD LEFT ARM  Final   Special Requests   Final    BOTTLES DRAWN AEROBIC AND ANAEROBIC Blood Culture adequate volume   Culture   Final    NO GROWTH 1 DAY Performed at [facility], [place] [address], [place], [place] [address]    Report Status PENDING  Incomplete     Radiology Studies: DG Chest Port 1 View  Result Date: 02/25/2020 CLINICAL DATA:  COVID, shortness of breath EXAM: PORTABLE CHEST 1 VIEW COMPARISON:  02/18/2020 FINDINGS: Right dialysis catheter remains in place, unchanged. Cardiomegaly. Bilateral airspace opacities are again noted, similar on the right to prior study, slightly increased in the left mid lung. No effusions or pneumothorax. IMPRESSION: Bilateral airspace disease, slightly worsening on the left since prior study. Stable cardiomegaly. Electronically Signed   By: Rolm Baptise M.D.   On: 02/25/2020 22:28    Scheduled Meds: . calcitRIOL  0.5 mcg Oral Q M,W,F-HD  . Chlorhexidine Gluconate Cloth  6 each Topical Daily  . [START ON 02/28/2020] Chlorhexidine Gluconate Cloth  6 each Topical Q0600  . darbepoetin (ARANESP) injection - NON-DIALYSIS  60 mcg Subcutaneous Q Wed-1800  . enoxaparin (LOVENOX) injection  30 mg Subcutaneous Q24H  . ferric citrate  1,050 mg Oral TID WC  . insulin aspart  0-9 Units Subcutaneous TID WC  . insulin aspart protamine- aspart  25 Units Subcutaneous BID WC  . Ipratropium-Albuterol  1 puff Inhalation BID  . methylPREDNISolone (SOLU-MEDROL) injection  40 mg Intravenous Daily  . metoprolol tartrate  12.5 mg Oral BID  . pramipexole  0.5 mg Oral Daily  . rosuvastatin  40 mg Oral Daily   Continuous Infusions: . ceFEPime (MAXIPIME) IV    . vancomycin       LOS: 1 day   Marylu Lund, MD [facility] Pager On Amion  If 7PM-7AM, please contact night-coverage 02/27/2020, 7:35 PM

## 2020-02-28 ENCOUNTER — Telehealth: Payer: Self-pay | Admitting: *Deleted

## 2020-02-28 ENCOUNTER — Inpatient Hospital Stay: Payer: Medicare Other | Admitting: Hematology

## 2020-02-28 ENCOUNTER — Other Ambulatory Visit (HOSPITAL_COMMUNITY): Payer: Self-pay | Admitting: Internal Medicine

## 2020-02-28 ENCOUNTER — Inpatient Hospital Stay: Payer: Medicare Other

## 2020-02-28 DIAGNOSIS — N186 End stage renal disease: Secondary | ICD-10-CM

## 2020-02-28 DIAGNOSIS — Z992 Dependence on renal dialysis: Secondary | ICD-10-CM

## 2020-02-28 LAB — CBC WITH DIFFERENTIAL/PLATELET
Abs Immature Granulocytes: 0.11 10*3/uL — ABNORMAL HIGH (ref 0.00–0.07)
Basophils Absolute: 0 10*3/uL (ref 0.0–0.1)
Basophils Relative: 0 %
Eosinophils Absolute: 0.2 10*3/uL (ref 0.0–0.5)
Eosinophils Relative: 2 %
HCT: 25.2 % — ABNORMAL LOW (ref 36.0–46.0)
Hemoglobin: 8.4 g/dL — ABNORMAL LOW (ref 12.0–15.0)
Immature Granulocytes: 2 %
Lymphocytes Relative: 24 %
Lymphs Abs: 1.7 10*3/uL (ref 0.7–4.0)
MCH: 32.2 pg (ref 26.0–34.0)
MCHC: 33.3 g/dL (ref 30.0–36.0)
MCV: 96.6 fL (ref 80.0–100.0)
Monocytes Absolute: 0.6 10*3/uL (ref 0.1–1.0)
Monocytes Relative: 8 %
Neutro Abs: 4.7 10*3/uL (ref 1.7–7.7)
Neutrophils Relative %: 64 %
Platelets: 157 10*3/uL (ref 150–400)
RBC: 2.61 MIL/uL — ABNORMAL LOW (ref 3.87–5.11)
RDW: 13.9 % (ref 11.5–15.5)
WBC: 7.3 10*3/uL (ref 4.0–10.5)
nRBC: 0 % (ref 0.0–0.2)

## 2020-02-28 LAB — COMPREHENSIVE METABOLIC PANEL
ALT: 17 U/L (ref 0–44)
AST: 14 U/L — ABNORMAL LOW (ref 15–41)
Albumin: 3.2 g/dL — ABNORMAL LOW (ref 3.5–5.0)
Alkaline Phosphatase: 73 U/L (ref 38–126)
Anion gap: 18 — ABNORMAL HIGH (ref 5–15)
BUN: 120 mg/dL — ABNORMAL HIGH (ref 6–20)
CO2: 22 mmol/L (ref 22–32)
Calcium: 9.3 mg/dL (ref 8.9–10.3)
Chloride: 92 mmol/L — ABNORMAL LOW (ref 98–111)
Creatinine, Ser: 9.4 mg/dL — ABNORMAL HIGH (ref 0.44–1.00)
GFR, Estimated: 5 mL/min — ABNORMAL LOW (ref >60)
Glucose, Bld: 248 mg/dL — ABNORMAL HIGH (ref 70–99)
Potassium: 5.4 mmol/L — ABNORMAL HIGH (ref 3.5–5.1)
Sodium: 132 mmol/L — ABNORMAL LOW (ref 135–145)
Total Bilirubin: 0.6 mg/dL (ref 0.3–1.2)
Total Protein: 6.9 g/dL (ref 6.5–8.1)

## 2020-02-28 LAB — PROCALCITONIN: Procalcitonin: 0.16 ng/mL

## 2020-02-28 LAB — GLUCOSE, CAPILLARY
Glucose-Capillary: 305 mg/dL — ABNORMAL HIGH (ref 70–99)
Glucose-Capillary: 187 mg/dL — ABNORMAL HIGH (ref 70–99)
Glucose-Capillary: 233 mg/dL — ABNORMAL HIGH (ref 70–99)

## 2020-02-28 LAB — D-DIMER, QUANTITATIVE: D-Dimer, Quant: 2.62 ug/mL-FEU — ABNORMAL HIGH (ref 0.00–0.50)

## 2020-02-28 LAB — C-REACTIVE PROTEIN: CRP: 4.3 mg/dL — ABNORMAL HIGH (ref <1.0)

## 2020-02-28 MED ORDER — PHENOL 1.4 % MT LIQD: 1.0000 | OROMUCOSAL | Status: DC | PRN

## 2020-02-28 MED ORDER — PREDNISONE 10 MG PO TABS: 40.0000 mg | ORAL_TABLET | Freq: Every day | ORAL | 0 refills | Status: DC

## 2020-02-28 MED FILL — predniSONE 10 MG TABS: 10 | 4 days supply | Qty: 16 | Fill #0

## 2020-02-28 NOTE — Progress Notes (Signed)
Patient to discharge today and return to her regular HD clinic and shift tomorrow for next HD tx. She needs to park by the side door and call when she arrives. She will be treated in an isolation pod. She should not enter the building until a staff comes to her car to get her. Navigator contacted patient to explain. Patient appreciative and understanding. Medical team aware.  Alphonzo Cruise, [place] Renal Navigator [phone]

## 2020-02-28 NOTE — Telephone Encounter (Signed)
Patient in hospital 1/27 - appt for lab and with Dr.Kale cancelled for that date. Schedule message sent to r/s appts once patient is d/c'd

## 2020-02-28 NOTE — TOC Transition Note (Signed)
Transition of Care (TOC) - CM/SW Discharge Note Marvetta Gibbons RN, BSN Transitions of Care Unit 4E- RN Case Manager See Treatment Team for direct phone # Cross Coverage for [place]   Patient Details  Name: [name] MRN: [id number] Date of Birth: 11/11/1965  Transition of Care [facility]) CM/SW Contact:  Dawayne Patricia, RN Phone Number: 02/28/2020, 1:08 PM   Clinical Narrative:    Have confirmed with Renal Navigator that pt will be able to go to her regular HD center for treatment +COVID, pt will stay on her regular time and will plan to go tomorrow for treatment.  Notified that pt will need HHPT, MD to place order.  Call made to pt in room to discuss- per pt she had been doing outpt PT- however due to her COVID + status she has not been able to go. Choice offered to pt for [facility] needs Per CMS guidelines from medicare.gov website with star ratings - per pt she does not have a preference and will defer to this writer to secure an agency on her behalf.  Call made to Amy with [facility] for HHPT referral- referral has been accepted. (noted pt has been with [facility] in the past - call made to [facility] who states pt is not currently active with them)- [facility] will reach out to pt to schedule start of care visit.    Final next level of care: [place] Barriers to Discharge: No Barriers Identified   Patient Goals and CMS Choice Patient states their goals for this hospitalization and ongoing recovery are:: return home CMS Medicare.gov Compare Post Acute Care list provided to:: Patient Choice offered to / list presented to : Patient  Discharge Placement                 Home with [facility]      Discharge Plan and Services   Discharge Planning Services: CM Consult Post Acute Care Choice: Home Health          DME Arranged: N/A DME Agency: NA       HH Arranged: PT HH Agency: [facility] Date [place]: 02/28/20 Time [place]:  1307 Representative spoke with at [place]: [place] ([place]) Interventions     Readmission Risk Interventions Readmission Risk Prevention Plan 02/28/2020  Transportation Screening Complete  PCP or Specialist Appt within 5-7 Days Complete  Home Care Screening Complete  Medication Review (RN CM) Complete  Some recent data might be hidden

## 2020-02-28 NOTE — Progress Notes (Signed)
Discharge instructions (including medications) discussed with and copy provided to patient/caregiver 

## 2020-02-28 NOTE — Evaluation (Signed)
Physical Therapy Evaluation Patient Details Name: [name] MRN: [id number] DOB: 1966/01/30 Today's Date: 02/28/2020   History of Present Illness  Pt is a 55 y/o female recently admitted secondary to [place], who presents from HD secondary to hypoxia. PMH includes HTN, ESRD on HD, DM, and COVID in 2020.  Clinical Impression  Pt admitted secondary to problem above with deficits below. Pt requiring min guard to supervision to ambulate with use of RW. Oxygen sats ranging from 93-96 % on RA. Pt currently has not been able to go to her outpatient PT secondary to COVID, so recommend HHPT as pt with increased risk for falls. Will continue to follow acutely.     Follow Up Recommendations Home health PT;Supervision for mobility/OOB    Equipment Recommendations  None recommended by PT    Recommendations for Other Services       Precautions / Restrictions Precautions Precautions: Fall Precaution Comments: Pt had fall during admission, but reports it was the chair that slid Restrictions Weight Bearing Restrictions: No      Mobility  Bed Mobility               General bed mobility comments: Sitting EOB upon entry    Transfers Overall transfer level: Needs assistance Equipment used: Rolling walker (2 wheeled) Transfers: Sit to/from Stand Sit to Stand: Min guard         General transfer comment: Min guard for safety. Used momentum to stand  Ambulation/Gait Ambulation/Gait assistance: Buyer, retail (Feet): 25 Feet Assistive device: Rolling walker (2 wheeled) Gait Pattern/deviations: Step-through pattern;Decreased stride length Gait velocity: Decreased   General Gait Details: Slow, cautious gait. No LOB noted with use of RW. Supervision to min guard for safety.  Stairs            Wheelchair Mobility    Modified Rankin (Stroke Patients Only)       Balance Overall balance assessment: Needs assistance Sitting-balance support: No upper  extremity supported Sitting balance-Leahy Scale: Good     Standing balance support: Bilateral upper extremity supported;During functional activity Standing balance-Leahy Scale: Poor Standing balance comment: Reliant on BUE support                             Pertinent Vitals/Pain Pain Assessment: No/denies pain    Home Living Family/patient expects to be discharged to:: Private residence Living Arrangements: Children;Spouse/significant other Available Help at Discharge: Family;Available 24 hours/day Type of Home: House Home Access: Level entry     Home Layout: One level Home Equipment: Walker - 4 wheels;Cane - single point;Shower seat;Hand held shower head      Prior Function Level of Independence: Independent with assistive device(s);Needs assistance         Comments: Uses rollator for ambulation     Hand Dominance        Extremity/Trunk Assessment   Upper Extremity Assessment Upper Extremity Assessment: Generalized weakness    Lower Extremity Assessment Lower Extremity Assessment: Generalized weakness    Cervical / Trunk Assessment Cervical / Trunk Assessment: Normal  Communication   Communication: No difficulties  Cognition Arousal/Alertness: Awake/alert Behavior During Therapy: WFL for tasks assessed/performed Overall Cognitive Status: Within Functional Limits for tasks assessed                                        General Comments General comments (  skin integrity, edema, etc.): SpO2 at 93-96 % on RA.    Exercises     Assessment/Plan    PT Assessment Patient needs continued PT services  PT Problem List Decreased strength;Decreased activity tolerance;Decreased balance;Decreased mobility;Decreased knowledge of use of DME;Decreased knowledge of precautions       PT Treatment Interventions DME instruction;Gait training;Functional mobility training;Therapeutic activities;Therapeutic exercise;Balance  training;Patient/family education    PT Goals (Current goals can be found in the Care Plan section)  Acute Rehab PT Goals Patient Stated Goal: to go home PT Goal Formulation: With patient Time For Goal Achievement: 03/13/20 Potential to Achieve Goals: Good    Frequency Min 3X/week   Barriers to discharge        Co-evaluation               AM-PAC PT "6 Clicks" Mobility  Outcome Measure Help needed turning from your back to your side while in a flat bed without using bedrails?: None Help needed moving from lying on your back to sitting on the side of a flat bed without using bedrails?: None Help needed moving to and from a bed to a chair (including a wheelchair)?: A Little Help needed standing up from a chair using your arms (e.g., wheelchair or bedside chair)?: A Little Help needed to walk in hospital room?: A Little Help needed climbing 3-5 steps with a railing? : A Little 6 Click Score: 20    End of Session   Activity Tolerance: Patient tolerated treatment well Patient left: in chair;with call bell/phone within reach Nurse Communication: Mobility status PT Visit Diagnosis: Unsteadiness on feet (R26.81);Muscle weakness (generalized) (M62.81);History of falling (Z91.81)    Time: 6553-7482 PT Time Calculation (min) (ACUTE ONLY): 18 min   Charges:   PT Evaluation $PT Eval Low Complexity: 1 Low          Lou Miner, DPT  Acute Rehabilitation Services  Pager: [phone] Office: [phone]   Rudean Hitt 02/28/2020, 1:27 PM

## 2020-02-28 NOTE — Plan of Care (Signed)
  Problem: Acute Rehab PT Goals(only PT should resolve) ?Goal: Pt Will Go Supine/Side To Sit ?Outcome: Adequate for Discharge ?Goal: Pt Will Go Sit To Supine/Side ?Outcome: Adequate for Discharge ?Goal: Patient Will Transfer Sit To/From Stand ?Outcome: Adequate for Discharge ?Goal: Pt Will Ambulate ?Outcome: Adequate for Discharge ?  ?

## 2020-02-28 NOTE — Progress Notes (Signed)
[facility] Progress Note  Subjective: pt seen in room, HD postponed again until tomorrow due to high census and inadequate staffing.   Vitals:   02/27/20 1340 02/27/20 1953 02/28/20 0306 02/28/20 0754  BP:   (!) 174/92 (!) 146/72  Pulse:   64 62  Resp:   20 20  Temp:  98.4 F (36.9 C) 97.6 F (36.4 C) (!) 97.1 F (36.2 C)  TempSrc:  Oral Oral Oral  SpO2:   96% 98%  Weight: 116.5 kg     Height:        Exam:   alert, nad , nasal O2 4-5 L   no jvd  Chest mostly clear, no rales or wheezing  Cor reg no RG  Abd soft ntnd no ascites   Ext 1-2 + bilat LE edema   Alert, NF, ox3   R IJ TDC    OP HD: GO MWF   4h 77min  2/2 bath 108.5kg   TDC  Hep 10,000  - calc 0.5 ug po tiw   Assessment/ Plan: 1. SOB / hypoxemia - CXR w/ IS edema, suspect vol overload which is a long-term problem for this patient. Pt is not in distress. HD is bumped again until tomorrow. Pt states he is thinking about going home today so he can get his next HD as outpatient.  2. ESRD - MWF HD. HD as above.   3. COVID +infection - per pmd 4. DM on insulin  5. HTN/volume -  Will lower/ change metop xl to 12.5 mg bid due to low BP's and vol overload/ need BP for UF on HD. 8kg up by standing wt today.  6. Chronic pain - on Oxy IR 7. 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 calcitRIOL  0.5 mcg Oral Q M,W,F-HD  . Chlorhexidine Gluconate Cloth  6 each Topical Daily  . Chlorhexidine Gluconate Cloth  6 each Topical Q0600  . darbepoetin (ARANESP) injection - DIALYSIS  60 mcg Intravenous Once  . [START ON 03/07/2020] darbepoetin (ARANESP)  injection - DIALYSIS  60 mcg Intravenous Q Fri-HD  . enoxaparin (LOVENOX) injection  30 mg Subcutaneous Q24H  . ferric citrate  1,050 mg Oral TID WC  . insulin aspart  0-9 Units Subcutaneous TID AC & HS  . insulin aspart protamine- aspart  25 Units Subcutaneous BID WC  . Ipratropium-Albuterol  1 puff Inhalation BID  . methylPREDNISolone (SOLU-MEDROL) injection  40 mg Intravenous Daily  . metoprolol tartrate  12.5 mg Oral BID  . pramipexole  0.5 mg Oral Daily  . rosuvastatin  40 mg Oral Daily    acetaminophen **OR** acetaminophen, albuterol, ferric citrate, phenol

## 2020-02-28 NOTE — Discharge Summary (Signed)
Physician Discharge Summary  [name] UYQ:[id number] DOB: 1965/09/27 DOA: 02/25/2020  PCP: Sandi Mariscal, MD  Admit date: 02/25/2020 Discharge date: 02/28/2020  Admitted From: Home Disposition:  Home  Recommendations for Outpatient Follow-up:  1. Follow up with PCP in 1-2 weeks 2. F/u with shceduled HD  Discharge Condition:Improved CODE STATUS:Full Diet recommendation: Renal, diabetic   Brief/Interim Summary: 55 yo F w/ Hx of ESRD on HD, DM2, HTN. Presenting with acute hypoxic respiratory failure. pMN admission. See H&P for full details.She has been weaned to 3-4L [place]. Vitals appear stable. She is feeling better overall. Procal is only 0.13. Rpt procal in AM, if it remains low, d/c abx. Continue steroids for now. Nephro consulted  Discharge Diagnoses:  Principal Problem:   Acute respiratory failure with hypoxia ([place]) Active Problems:   Hypertension   Type II diabetes mellitus with complication, uncontrolled (H[facility])   ESRD on dialysis ([place])   Chronic pain disorder   End-stage renal disease on hemodialysis ([place])  Acute respiratory failure with hypoxia COVID+ (recent admission)  - Initially required 100% nonrebreather at this time to maintain sats more than 90% - recent Covid infection suspect likely related to worsening pulmonary changes to Covid.  - CXR showing persistent and worsening infiltrate - on IV steroids, empiric abx - added combivent - Procal is consistently <0.25, thus discontinued antibiotics -Pt to complete course of po prednisone on discharge  ESRD on hemodialysis on Monday Wednesday Friday - Per report, pt had complete session on Monday - Nephrology was consulted. Discussed with Nephrology. Pt to continue HD as outpatient  Chronic pain - Initially held pain medication d/t lethargy  DMt2 - continue SSI, home insulin while in hospital  HTN  - continue metoprolol.  Thrombocytopenia - likely secondary  to covid infection -no acute blood loss -Resolved  Discharge Instructions   Allergies as of 02/28/2020      Reactions   Bupropion Itching      Medication List    STOP taking these medications   Humira Pen 40 MG/0.4ML Pnkt Generic drug: Adalimumab     TAKE these medications   albuterol 108 (90 Base) MCG/ACT inhaler Commonly known as: VENTOLIN HFA Inhale 2 puffs into the lungs every 6 (six) hours as needed for wheezing or shortness of breath.   Auryxia 1 GM 210 MG(Fe) tablet Generic drug: ferric citrate Take 630-1,050 mg by mouth See admin instructions. Take 5 tablets (1050 mg) by mouth with each meal & take 3 tablets (630 mg) by mouth with each snack   b complex-vitamin c-folic acid 0.8 MG Tabs tablet Take 1 tablet by mouth every Monday, Wednesday, and Friday with hemodialysis.   blood glucose meter kit and supplies Kit Dispense based on patient and insurance preference. Use up to four times daily as directed. (FOR ICD-9 250.00, 250.01).   calcitRIOL 0.5 MCG capsule Commonly known as: ROCALTROL Take 0.5 mcg by mouth as directed. Given at Dialysis   hydrOXYzine 25 MG tablet Commonly known as: ATARAX/VISTARIL Take 25 mg by mouth daily.   Insulin Lispro Prot & Lispro (75-25) 100 UNIT/ML Kwikpen Commonly known as: HUMALOG 75/25 MIX Inject 25 Units into the skin 2 (two) times daily with a meal.   metoprolol succinate 100 MG 24 hr tablet Commonly known as: TOPROL-XL Take 100 mg by mouth daily. Take with or immediately following a meal.   MIRCERA IJ Mircera   Narcan 4 MG/0.1ML Liqd nasal spray kit Generic drug: naloxone Place 1 spray into the nose as needed (accidental  overdose.).   Oxycodone HCl 10 MG Tabs Take 10 mg by mouth 3 (three) times daily.   pramipexole 0.5 MG tablet Commonly known as: MIRAPEX Take 0.5 mg by mouth daily.   predniSONE 10 MG tablet Commonly known as: DELTASONE Take 4 tablets (40 mg total) by mouth daily with breakfast for 4 days.    rosuvastatin 40 MG tablet Commonly known as: CRESTOR TAKE 1 TABLET BY MOUTH EVERY DAY *PATIENT NEEDS APPOINTMENT FOR FURTHER REFILLS* What changed: See the new instructions.       Follow-up Information    Sandi Mariscal, MD. Schedule an appointment as soon as possible for a visit in 2 week(s).   Specialty: Internal Medicine Contact information: [place] [address] [phone]        Minus Breeding, MD .   Specialty: Cardiology Contact information: [address] [place] [address] [phone]              Allergies  Allergen Reactions  . Bupropion Itching    Consultations:  Nephrology  Procedures/Studies: CT ANGIO CHEST PE W OR WO CONTRAST  Result Date: 02/19/2020 CLINICAL DATA:  COVID positive. Acute respiratory failure with hypoxia. EXAM: CT ANGIOGRAPHY CHEST WITH CONTRAST TECHNIQUE: Multidetector CT imaging of the chest was performed using the standard protocol during bolus administration of intravenous contrast. Multiplanar CT image reconstructions and MIPs were obtained to evaluate the vascular anatomy. CONTRAST:  58m OMNIPAQUE IOHEXOL 350 MG/ML SOLN COMPARISON:  PET-CT August 15, 2019 and CT a chest November 14, 2018. FINDINGS: Cardiovascular: Satisfactory opacification of the pulmonary arteries to the mid segmental level. No evidence of pulmonary embolism. Cardiomegaly similar prior. No pericardial effusion. Left upper extremity PICC with tip in the right atrium. Mediastinum/Nodes: No significant change in the prominent/mildly enlarged mediastinal and axillary lymph nodes, for instance the mildly metabolic precarinal lymph node measures 1.5 cm in maximum axial diameter, and is unchanged when remeasured for consistency (series 5, image 33). Multinodular appearance of the thyroid with the largest nodule measuring 1.6 cm in the left lobe. The trachea and esophagus are otherwise unremarkable. Lungs/Pleura: Mosaic attenuation with  multifocal ground-glass opacities and nodularity. No pleural effusion or pneumothorax. Upper Abdomen: No right upper pole renal neoplasm incompletely visualized on today's exam. Musculoskeletal: Multilevel degenerative changes spine. No suspicious lytic or blastic lesion of bone. Review of the MIP images confirms the above findings. IMPRESSION: 1. No evidence of pulmonary embolism. 2. Mosaic attenuation with multifocal ground-glass opacities and nodularity, which may represent multifocal pneumonia or edema. 3. No significant change in the prominent/mildly enlarged mediastinal and axillary lymph nodes, which may be reactive. 4. Enlarged multinodular appearance of the thyroid with a 1.6 cm left thyroid nodule, further evaluation with thyroid ultrasound could be utilized if not previously performed. Electronically Signed   By: JDahlia BailiffMD   On: 02/19/2020 11:19   DG Chest Port 1 View  Result Date: 02/25/2020 CLINICAL DATA:  COVID, shortness of breath EXAM: PORTABLE CHEST 1 VIEW COMPARISON:  02/18/2020 FINDINGS: Right dialysis catheter remains in place, unchanged. Cardiomegaly. Bilateral airspace opacities are again noted, similar on the right to prior study, slightly increased in the left mid lung. No effusions or pneumothorax. IMPRESSION: Bilateral airspace disease, slightly worsening on the left since prior study. Stable cardiomegaly. Electronically Signed   By: KRolm BaptiseM.D.   On: 02/25/2020 22:28   DG Chest Portable 1 View  Result Date: 02/18/2020 CLINICAL DATA:  Shortness of breath.  History of urologic carcinoma EXAM:  PORTABLE CHEST 1 VIEW COMPARISON:  November 14, 2018 chest radiograph; PET-CT August 15, 2019. FINDINGS: Central catheter tip is in the right atrium. No pneumothorax. There is cardiomegaly with pulmonary venous hypertension. Small left pleural effusion noted. There is diffuse interstitial thickening, likely indicative of pulmonary edema. No consolidation. There is a nodular lesion in  the left mid lung measuring 1.2 x 1.1 cm. There is a questionable nodular area overlying the inferior right hilum measuring 2.3 x 2.1 cm. No appreciable adenopathy by radiography.  No bone lesions. IMPRESSION: 1. Well-defined nodular opacity in left perihilar region measuring 1.2 x 1.1 cm. Questionable less well-defined nodular opacity right lower lung region medially measuring 2.3 x 2.1 cm. These findings raise concern for metastatic foci. Correlation with chest CT, ideally with intravenous contrast, advised to further evaluate. 2. Cardiomegaly with pulmonary vascular congestion. Diffuse interstitial prominence likely represents interstitial pulmonary edema. Suspect a degree of congestive heart failure. Note that lymphangitic spread of tumor potentially could present in this manner. CT may be helpful for further delineation in this regard. 3.  Central catheter tip in right atrium.  No pneumothorax. Electronically Signed   By: Lowella Grip III M.D.   On: 02/18/2020 12:10   VAS [place] LOWER EXTREMITY VENOUS (DVT)  Result Date: 02/19/2020  Lower Venous DVT Study Indications: Swelling, and Edema.  Comparison Study: no prior Performing Technologist: Abram Sander RVS  Examination Guidelines: A complete evaluation includes B-mode imaging, spectral Doppler, color Doppler, and power Doppler as needed of all accessible portions of each vessel. Bilateral testing is considered an integral part of a complete examination. Limited examinations for reoccurring indications may be performed as noted. The reflux portion of the exam is performed with the patient in reverse Trendelenburg.  +---------+---------------+---------+-----------+----------+--------------+ RIGHT    CompressibilityPhasicitySpontaneityPropertiesThrombus Aging +---------+---------------+---------+-----------+----------+--------------+ CFV      Full           Yes      Yes                                  +---------+---------------+---------+-----------+----------+--------------+ SFJ      Full                                                        +---------+---------------+---------+-----------+----------+--------------+ FV Prox  Full                                                        +---------+---------------+---------+-----------+----------+--------------+ FV Mid   Full                                                        +---------+---------------+---------+-----------+----------+--------------+ FV Distal               Yes      Yes                                 +---------+---------------+---------+-----------+----------+--------------+  PFV      Full                                                        +---------+---------------+---------+-----------+----------+--------------+ POP      Full           Yes      Yes                                 +---------+---------------+---------+-----------+----------+--------------+ PTV      Full                                                        +---------+---------------+---------+-----------+----------+--------------+ PERO     Full                                                        +---------+---------------+---------+-----------+----------+--------------+   +---------+---------------+---------+-----------+----------+-------------------+ LEFT     CompressibilityPhasicitySpontaneityPropertiesThrombus Aging      +---------+---------------+---------+-----------+----------+-------------------+ CFV      Full           Yes      Yes                                      +---------+---------------+---------+-----------+----------+-------------------+ SFJ      Full                                                             +---------+---------------+---------+-----------+----------+-------------------+ FV Prox  Full                                                              +---------+---------------+---------+-----------+----------+-------------------+ FV Mid   Full                                                             +---------+---------------+---------+-----------+----------+-------------------+ FV Distal               Yes      Yes                                      +---------+---------------+---------+-----------+----------+-------------------+ PFV      Full                                                             +---------+---------------+---------+-----------+----------+-------------------+  POP      Full           Yes      Yes                                      +---------+---------------+---------+-----------+----------+-------------------+ PTV      Full                                                             +---------+---------------+---------+-----------+----------+-------------------+ PERO                                                  Not well visualized +---------+---------------+---------+-----------+----------+-------------------+     Summary: BILATERAL: - No evidence of deep vein thrombosis seen in the lower extremities, bilaterally. - No evidence of superficial venous thrombosis in the lower extremities, bilaterally. -No evidence of popliteal cyst, bilaterally.   *See table(s) above for measurements and observations. Electronically signed by Deitra Mayo MD on 02/19/2020 at 10:22:57 AM.    Final      Subjective: Very eager to go home today  Discharge Exam: Vitals:   02/28/20 0306 02/28/20 0754  BP: (!) 174/92 (!) 146/72  Pulse: 64 62  Resp: 20 20  Temp: 97.6 F (36.4 C) (!) 97.1 F (36.2 C)  SpO2: 96% 98%   Vitals:   02/27/20 1340 02/27/20 1953 02/28/20 0306 02/28/20 0754  BP:   (!) 174/92 (!) 146/72  Pulse:   64 62  Resp:   20 20  Temp:  98.4 F (36.9 C) 97.6 F (36.4 C) (!) 97.1 F (36.2 C)  TempSrc:  Oral Oral Oral  SpO2:   96% 98%  Weight: 116.5 kg     Height:         General: Pt is alert, awake, not in acute distress Cardiovascular: RRR, S1/S2 +, no rubs, no gallops Respiratory: CTA bilaterally, no wheezing, no rhonchi Abdominal: Soft, NT, ND, bowel sounds + Extremities: no edema, no cyanosis   The results of significant diagnostics from this hospitalization (including imaging, microbiology, ancillary and laboratory) are listed below for reference.     Microbiology: Recent Results (from the past 240 hour(s))  Resp Panel by RT-PCR (Flu A&B, Covid) Nasopharyngeal Swab     Status: Abnormal   Collection Time: 02/18/20 12:48 PM   Specimen: Nasopharyngeal Swab; Nasopharyngeal(NP) swabs in vial transport medium  Result Value Ref Range Status   SARS Coronavirus 2 by RT PCR POSITIVE (A) NEGATIVE Final    Comment: emailed L. Berdik RN 14:40 02/18/20 ([email or web address]) (NOTE) SARS-CoV-2 target nucleic acids are DETECTED.  The SARS-CoV-2 RNA is generally detectable in upper respiratory specimens during the acute phase of infection. Positive results are indicative of the presence of the identified virus, but do not rule out bacterial infection or co-infection with other pathogens not detected by the test. Clinical correlation with patient history and other diagnostic information is necessary to determine patient infection status. The expected result is Negative.  Fact Sheet for Patients: EntrepreneurPulse.com.au  Fact Sheet for Healthcare Providers: IncredibleEmployment.be  This test is not yet approved or  cleared by the [place] and  has been authorized for detection and/or diagnosis of SARS-CoV-2 by FDA under an Emergency Use Authorization (EUA).  This EUA will remain in effect (meaning this test can be used) for the duration of  the COVID-1 9 declaration under Section 564(b)(1) of the Act, 21 U.S.C. section 360bbb-3(b)(1), unless the authorization is terminated or revoked sooner.     Influenza A by PCR  NEGATIVE NEGATIVE Final   Influenza B by PCR NEGATIVE NEGATIVE Final    Comment: (NOTE) The Xpert Xpress SARS-CoV-2/FLU/RSV plus assay is intended as an aid in the diagnosis of influenza from Nasopharyngeal swab specimens and should not be used as a sole basis for treatment. Nasal washings and aspirates are unacceptable for Xpert Xpress SARS-CoV-2/FLU/RSV testing.  Fact Sheet for Patients: EntrepreneurPulse.com.au  Fact Sheet for Healthcare Providers: IncredibleEmployment.be  This test is not yet approved or cleared by the [place] FDA and has been authorized for detection and/or diagnosis of SARS-CoV-2 by FDA under an Emergency Use Authorization (EUA). This EUA will remain in effect (meaning this test can be used) for the duration of the COVID-19 declaration under Section 564(b)(1) of the Act, 21 U.S.C. section 360bbb-3(b)(1), unless the authorization is terminated or revoked.  Performed at [facility], [place] [address] [place], [place] [address]   MRSA PCR Screening     Status: None   Collection Time: 02/19/20 10:52 AM   Specimen: Nasal Mucosa; Nasopharyngeal  Result Value Ref Range Status   MRSA by PCR NEGATIVE NEGATIVE Final    Comment:        The GeneXpert MRSA Assay (FDA approved for NASAL specimens only), is one component of a comprehensive MRSA colonization surveillance program. It is not intended to diagnose MRSA infection nor to guide or monitor treatment for MRSA infections. Performed at [facility], [place] [address], [place], [place] [address]   Blood Culture (routine x 2)     Status: None (Preliminary result)   Collection Time: 02/26/20 12:10 AM   Specimen: BLOOD  Result Value Ref Range Status   Specimen Description BLOOD SITE NOT SPECIFIED  Final   Special Requests   Final    BOTTLES DRAWN AEROBIC AND ANAEROBIC Blood Culture adequate volume   Culture   Final    NO GROWTH 1 DAY Performed at [facility], [place] [address], [place], [place] [address]    Report Status PENDING  Incomplete  Blood Culture (routine x 2)     Status: None (Preliminary result)   Collection Time: 02/26/20  2:20 AM   Specimen: BLOOD LEFT ARM  Result Value Ref Range Status   Specimen Description BLOOD LEFT ARM  Final   Special Requests   Final    BOTTLES DRAWN AEROBIC AND ANAEROBIC Blood Culture adequate volume   Culture   Final    NO GROWTH 1 DAY Performed at [facility], [place] [address] [place], [place] [address]    Report Status PENDING  Incomplete     Labs: BNP (last 3 results) Recent Labs    02/20/20 0206 02/21/20 0514 02/26/20 0004  BNP 1,385.7* 1,069.9* 676.1*   Basic Metabolic Panel: Recent Labs  Lab 02/26/20 0004 02/26/20 0545 02/26/20 0807 02/27/20 0656 02/28/20 0612  NA 138  --  136 134* 132*  K 4.0  --  4.9 4.9 5.4*  CL 96*  --   --  92* 92*  CO2 29  --   --  25 22  GLUCOSE 85  --   --  242* 248*  BUN 50*  --   --  90* 120*  CREATININE 5.55* 6.33*  --  8.09* 9.40*  CALCIUM 8.6*  --   --  9.0 9.3   Liver Function Tests: Recent Labs  Lab 02/26/20 0004 02/27/20 0656 02/28/20 0612  AST 14* 12* 14*  ALT 17 16 17   ALKPHOS 76 66 73  BILITOT 0.8 0.8 0.6  PROT 7.4 6.4* 6.9  ALBUMIN 3.2* 3.1* 3.2*   No results for input(s): LIPASE, AMYLASE in the last 168 hours. No results for input(s): AMMONIA in the last 168 hours. CBC: Recent Labs  Lab 02/26/20 0004 02/26/20 0545 02/26/20 0807 02/27/20 0656 02/28/20 0612  WBC 5.5 8.4  --  7.8 7.3  NEUTROABS 3.6  --   --  5.4 4.7  HGB 13.0 9.5* 9.2* 7.8* 8.4*  HCT 39.9 28.9* 27.0* 23.8* 25.2*  MCV 98.5 100.3*  --  99.2 96.6  PLT 112* 173  --  155 157   Cardiac Enzymes: No results for input(s): CKTOTAL, CKMB, CKMBINDEX, TROPONINI in the last 168 hours. BNP: Invalid input(s): POCBNP CBG: Recent Labs  Lab 02/27/20 1558 02/27/20 2032 02/28/20 0258 02/28/20 0751 02/28/20 1158  GLUCAP 264* 304* 305* 187* 233*    D-Dimer Recent Labs    02/27/20 0656 02/28/20 0612  DDIMER 2.95* 2.62*   Hgb A1c No results for input(s): HGBA1C in the last 72 hours. Lipid Profile Recent Labs    02/26/20 0004  TRIG 122   Thyroid function studies No results for input(s): TSH, T4TOTAL, T3FREE, THYROIDAB in the last 72 hours.  Invalid input(s): FREET3 Anemia work up Recent Labs    02/26/20 0004  FERRITIN 1,323*   Urinalysis    Component Value Date/Time   COLORURINE YELLOW 07/22/2011 1526   APPEARANCEUR CLOUDY (A) 07/22/2011 1526   LABSPEC 1.022 07/22/2011 1526   PHURINE 5.5 07/22/2011 1526   GLUCOSEU 100 (A) 07/22/2011 1526   HGBUR MODERATE (A) 07/22/2011 1526   BILIRUBINUR NEGATIVE 07/22/2011 1526   BILIRUBINUR NEG 07/19/2011 1202   KETONESUR NEGATIVE 07/22/2011 1526   PROTEINUR >300 (A) 07/22/2011 1526   UROBILINOGEN 0.2 07/22/2011 1526   NITRITE NEGATIVE 07/22/2011 1526   LEUKOCYTESUR TRACE (A) 07/22/2011 1526   Sepsis Labs Invalid input(s): PROCALCITONIN,  WBC,  LACTICIDVEN Microbiology Recent Results (from the past 240 hour(s))  Resp Panel by RT-PCR (Flu A&B, Covid) Nasopharyngeal Swab     Status: Abnormal   Collection Time: 02/18/20 12:48 PM   Specimen: Nasopharyngeal Swab; Nasopharyngeal(NP) swabs in vial transport medium  Result Value Ref Range Status   SARS Coronavirus 2 by RT PCR POSITIVE (A) NEGATIVE Final    Comment: emailed L. Berdik RN 14:40 02/18/20 ([email or web address]) (NOTE) SARS-CoV-2 target nucleic acids are DETECTED.  The SARS-CoV-2 RNA is generally detectable in upper respiratory specimens during the acute phase of infection. Positive results are indicative of the presence of the identified virus, but do not rule out bacterial infection or co-infection with other pathogens not detected by the test. Clinical correlation with patient history and other diagnostic information is necessary to determine patient infection status. The expected result is Negative.  Fact Sheet for  Patients: EntrepreneurPulse.com.au  Fact Sheet for Healthcare Providers: IncredibleEmployment.be  This test is not yet approved or cleared by the [place] FDA and  has been authorized for detection and/or diagnosis of SARS-CoV-2 by FDA under an Emergency Use Authorization (EUA).  This EUA will remain in  effect (meaning this test can be used) for the duration of  the COVID-1 9 declaration under Section 564(b)(1) of the Act, 21 U.S.C. section 360bbb-3(b)(1), unless the authorization is terminated or revoked sooner.     Influenza A by PCR NEGATIVE NEGATIVE Final   Influenza B by PCR NEGATIVE NEGATIVE Final    Comment: (NOTE) The Xpert Xpress SARS-CoV-2/FLU/RSV plus assay is intended as an aid in the diagnosis of influenza from Nasopharyngeal swab specimens and should not be used as a sole basis for treatment. Nasal washings and aspirates are unacceptable for Xpert Xpress SARS-CoV-2/FLU/RSV testing.  Fact Sheet for Patients: EntrepreneurPulse.com.au  Fact Sheet for Healthcare Providers: IncredibleEmployment.be  This test is not yet approved or cleared by the [place] FDA and has been authorized for detection and/or diagnosis of SARS-CoV-2 by FDA under an Emergency Use Authorization (EUA). This EUA will remain in effect (meaning this test can be used) for the duration of the COVID-19 declaration under Section 564(b)(1) of the Act, 21 U.S.C. section 360bbb-3(b)(1), unless the authorization is terminated or revoked.  Performed at [facility], [place] [address], [place], [place] [address]   MRSA PCR Screening     Status: None   Collection Time: 02/19/20 10:52 AM   Specimen: Nasal Mucosa; Nasopharyngeal  Result Value Ref Range Status   MRSA by PCR NEGATIVE NEGATIVE Final    Comment:        The GeneXpert MRSA Assay (FDA approved for NASAL specimens only), is one component of a comprehensive  MRSA colonization surveillance program. It is not intended to diagnose MRSA infection nor to guide or monitor treatment for MRSA infections. Performed at [facility], [place] [address] [place], [place] [address]   Blood Culture (routine x 2)     Status: None (Preliminary result)   Collection Time: 02/26/20 12:10 AM   Specimen: BLOOD  Result Value Ref Range Status   Specimen Description BLOOD SITE NOT SPECIFIED  Final   Special Requests   Final    BOTTLES DRAWN AEROBIC AND ANAEROBIC Blood Culture adequate volume   Culture   Final    NO GROWTH 1 DAY Performed at [facility], [place] [address], [place], [place] [address]    Report Status PENDING  Incomplete  Blood Culture (routine x 2)     Status: None (Preliminary result)   Collection Time: 02/26/20  2:20 AM   Specimen: BLOOD LEFT ARM  Result Value Ref Range Status   Specimen Description BLOOD LEFT ARM  Final   Special Requests   Final    BOTTLES DRAWN AEROBIC AND ANAEROBIC Blood Culture adequate volume   Culture   Final    NO GROWTH 1 DAY Performed at [facility], [place] [address], [place], [place] [address]    Report Status PENDING  Incomplete   Time spent: 31mn  SIGNED:   SMarylu Lund MD  [facility] 02/28/2020, 12:18 PM  If 7PM-7AM, please contact night-coverage

## 2020-02-29 ENCOUNTER — Telehealth: Payer: Self-pay | Admitting: Nurse Practitioner

## 2020-02-29 ENCOUNTER — Telehealth: Payer: Self-pay | Admitting: Hematology

## 2020-02-29 NOTE — Telephone Encounter (Signed)
R/s appt per 1/27 sch msg  - unable to reach pt .and unable to leave message. Mailed letter with appt date and time

## 2020-02-29 NOTE — Telephone Encounter (Signed)
Transition of care contact from inpatient facility  Date of Discharge: 02/28/20 Date of Contact:  02/29/20 Method of contact: Phone  Attempted to contact patient to discuss transition of care from inpatient admission. No voicemail associated with telephone number. Unable to leave message.

## 2020-03-02 LAB — CULTURE, BLOOD (ROUTINE X 2)
Culture: NO GROWTH
Culture: NO GROWTH
Special Requests: ADEQUATE
Special Requests: ADEQUATE

## 2020-03-12 NOTE — Progress Notes (Incomplete)
HEMATOLOGY/ONCOLOGY CONSULTATION NOTE  Date of Service: 03/12/2020  Patient Care Team: Sandi Mariscal, MD as PCP - General (Internal Medicine) Minus Breeding, MD as PCP - Cardiology (Cardiology) Leandrew Koyanagi, MD as Attending Physician (Orthopedic Surgery) [facility]  CHIEF COMPLAINTS/PURPOSE OF CONSULTATION:  Lymphadenopathy/Enlarged Spleen/Splenic Lesions  HISTORY OF PRESENTING ILLNESS:    INTERVAL HISTORY [name] is a wonderful 55 y.o. female who has been referred to [place] by Dr. Harrie Jeans for evaluation and management of lymphadenopathy/enlarged spleen/splenic lesions. The patient's last visit with [place] was a Telehealth visit on 08/23/19. The pt reports that she is doing well overall.  The pt reports ***  Lab results today 03/13/2020 of CBC w/diff and CMP is as follows: all values are WNL except for ***  On review of systems, pt reports *** and denies *** and any other symptoms.  MEDICAL HISTORY:  Past Medical History:  Diagnosis Date  . Anemia   . Anxiety   . Arthritis    "knees" "hands", "RA"  . CAD (coronary artery disease)    Nonobstructive on CT 2019  . CHF (congestive heart failure) ([place])   . COVID-19 10/2018  . Dyspnea    "when I have too much fluid."  . ESRD (end stage renal disease) ([place])    Dialysis [facility]- 3rd st  . Headache(784.0)   . Heart murmur   . High cholesterol   . History of blood transfusion   . Hypertension   . Mitral regurgitation    moderate to severe MR 10/2018 echo  . 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 Pericardial effusion   . Pneumonia    ; 11/16/2018- "touch of pneumonia" - seen in ED- 11/14/2018- on antibiotic. Has had it x 2  . PVD (peripheral vascular disease) ([place])    Right leg stent in [place].  (No records)  . Restless legs   . Sleep apnea    does not use Cpap  . Thoracic ascending aortic aneurysm ([facility])    4.4 cm 11/14/18 CTA  . Type II diabetes mellitus ([place])      SURGICAL HISTORY: Past Surgical History:  Procedure Laterality Date  . AMPUTATION Left 12/27/2018   Procedure: LEFT THUMB REVISION AMPUTATION DIGIT;  Surgeon: Charlotte Crumb, MD;  Location: [facility];  Service: Orthopedics;  Laterality: Left;  . AV FISTULA PLACEMENT Left   . AV FISTULA PLACEMENT Right 03/12/2019   Procedure: INSERTION OF ARTERIOVENOUS (AV) GORE-TEX GRAFT ARM;  Surgeon: Elam Dutch, MD;  Location: [facility] OR;  Service: Vascular;  Laterality: Right;  . AV FISTULA PLACEMENT Right 08/13/2019   Procedure: RIGHT UPPER ARM ARTERIOVENOUS (AV) FISTULA CREATION WITH BASILIC VEIN;  Surgeon: Elam Dutch, MD;  Location: [facility];  Service: Vascular;  Laterality: Right;  . [place]; 1997   x 2  . COLONOSCOPY    . EYE SURGERY Bilateral    cataract surgery  . INSERTION OF DIALYSIS CATHETER Right 09/26/2018   Procedure: INSERTION OF DIALYSIS CATHETER Right Internal Jugular.;  Surgeon: Elam Dutch, MD;  Location: [place];  Service: Vascular;  Laterality: Right;  . IR FLUORO GUIDE CV LINE RIGHT  05/04/2019  . IR US GUIDE VASC ACCESS RIGHT  05/04/2019  . LIGATION OF ARTERIOVENOUS  FISTULA Left 09/26/2018   Procedure: LIGATION OF ARTERIOVENOUS  FISTULA LEFT ARM;  Surgeon: Elam Dutch, MD;  Location: [place] [facility];  Service: Vascular;  Laterality: Left;  .  PERICARDIAL WINDOW  02/2004   for pericardial effusion  . PERIPHERAL VASCULAR INTERVENTION Right 10/26/2019   Procedure: PERIPHERAL VASCULAR INTERVENTION;  Surgeon: Elam Dutch, MD;  Location: [facility];  Service: Cardiovascular;  Laterality: Right;  arm  AV fistula  . TUBAL LIGATION  05/1995  . UPPER EXTREMITY VENOGRAPHY N/A 06/22/2019   Procedure: UPPER EXTREMITY VENOGRAPHY - Right Central;  Surgeon: Elam Dutch, MD;  Location: [facility];  Service: Cardiovascular;  Laterality: N/A;    SOCIAL HISTORY: Social History   Socioeconomic History  . Marital status: Married    Spouse name: Not on  file  . Number of children: Not on file  . Years of education: Not on file  . Highest education level: Not on file  Occupational History  . Not on file  Tobacco Use  . Smoking status: Former Smoker    Years: 33.00    Types: Cigarettes    Quit date: 07/17/2019    Years since quitting: 0.6  . Smokeless tobacco: Never Used  . Tobacco comment: using NIcotene gum, rare 1/2 cigarette  Vaping Use  . Vaping Use: Never used  Substance and Sexual Activity  . Alcohol use: No  . Drug use: No  . Sexual activity: Not Currently    Birth control/protection: Post-menopausal  Other Topics Concern  . Not on file  Social History Narrative   Lives with son.        Social Determinants of Health   Financial Resource Strain: Not on file  Food Insecurity: Not on file  Transportation Needs: Not on file  Physical Activity: Not on file  Stress: Not on file  Social Connections: Not on file  Intimate Partner Violence: Not on file    FAMILY HISTORY: Family History  Problem Relation Age of Onset  . Cancer Brother   . Heart disease Father        Died age 9  . Diabetes Father   . Hyperlipidemia Father   . Hypertension Father   . Stroke Father   . Diabetes Mother   . Hypertension Mother   . Stroke Mother   . Dementia Mother   . Diabetes Sister   . Diabetes Brother   . Hypertension Sister   . Hypertension Brother     ALLERGIES:  is allergic to bupropion.  MEDICATIONS:  Current Outpatient Medications  Medication Sig Dispense Refill  . albuterol (VENTOLIN HFA) 108 (90 Base) MCG/ACT inhaler Inhale 2 puffs into the lungs every 6 (six) hours as needed for wheezing or shortness of breath. 6.7 g 0  . AURYXIA 1 GM 210 MG(Fe) tablet Take 630-1,050 mg by mouth See admin instructions. Take 5 tablets (1050 mg) by mouth with each meal & take 3 tablets (630 mg) by mouth with each snack    . b complex-vitamin c-folic acid (NEPHRO-VITE) 0.8 MG TABS tablet Take 1 tablet by mouth every Monday, Wednesday,  and Friday with hemodialysis.    Marland Kitchen blood glucose meter kit and supplies KIT Dispense based on patient and insurance preference. Use up to four times daily as directed. (FOR ICD-9 250.00, 250.01). 1 each 0  . calcitRIOL (ROCALTROL) 0.5 MCG capsule Take 0.5 mcg by mouth as directed. Given at Dialysis    . hydrOXYzine (ATARAX/VISTARIL) 25 MG tablet Take 25 mg by mouth daily.    . Insulin Lispro Prot & Lispro (HUMALOG 75/25 MIX) (75-25) 100 UNIT/ML Kwikpen Inject 25 Units into the skin 2 (two) times daily with a meal.    .  Methoxy PEG-Epoetin Beta (MIRCERA IJ) Mircera    . metoprolol succinate (TOPROL-XL) 100 MG 24 hr tablet Take 100 mg by mouth daily. Take with or immediately following a meal.    . NARCAN 4 MG/0.1ML LIQD nasal spray kit Place 1 spray into the nose as needed (accidental overdose.).     Marland Kitchen Oxycodone HCl 10 MG TABS Take 10 mg by mouth 3 (three) times daily.    . pramipexole (MIRAPEX) 0.5 MG tablet Take 0.5 mg by mouth daily.   1  . rosuvastatin (CRESTOR) 40 MG tablet TAKE 1 TABLET BY MOUTH EVERY DAY *PATIENT NEEDS APPOINTMENT FOR FURTHER REFILLS* (Patient taking differently: Take 40 mg by mouth daily.) 15 tablet 0   No current facility-administered medications for this visit.    REVIEW OF SYSTEMS:   10 Point review of Systems was done is negative except as noted above.  PHYSICAL EXAMINATION: ECOG PERFORMANCE STATUS: 2 - Symptomatic, <50% confined to bed  . There were no vitals filed for this visit. There were no vitals filed for this visit. .There is no height or weight on file to calculate BMI.  *** GENERAL:alert, in no acute distress and comfortable SKIN: no acute rashes, no significant lesions EYES: conjunctiva are pink and non-injected, sclera anicteric OROPHARYNX: MMM, no exudates, no oropharyngeal erythema or ulceration NECK: supple, no JVD LYMPH:  no palpable lymphadenopathy in the cervical, axillary or inguinal regions LUNGS: clear to auscultation b/l with normal  respiratory effort HEART: regular rate & rhythm ABDOMEN:  normoactive bowel sounds , non tender, not distended. Extremity: no pedal edema PSYCH: alert & oriented x 3 with fluent speech NEURO: no focal motor/sensory deficits  LABORATORY DATA:  I have reviewed the data as listed  . CBC Latest Ref Rng & Units 02/28/2020 02/27/2020 02/26/2020  WBC 4.0 - 10.5 K/uL 7.3 7.8 -  Hemoglobin 12.0 - 15.0 g/dL 8.4(L) 7.8(L) 9.2(L)  Hematocrit 36.0 - 46.0 % 25.2(L) 23.8(L) 27.0(L)  Platelets 150 - 400 K/uL 157 155 -    . CMP Latest Ref Rng & Units 02/28/2020 02/27/2020 02/26/2020  Glucose 70 - 99 mg/dL 248(H) 242(H) -  BUN 6 - 20 mg/dL 120(H) 90(H) -  Creatinine 0.44 - 1.00 mg/dL 9.40(H) 8.09(H) -  Sodium 135 - 145 mmol/L 132(L) 134(L) 136  Potassium 3.5 - 5.1 mmol/L 5.4(H) 4.9 4.9  Chloride 98 - 111 mmol/L 92(L) 92(L) -  CO2 22 - 32 mmol/L 22 25 -  Calcium 8.9 - 10.3 mg/dL 9.3 9.0 -  Total Protein 6.5 - 8.1 g/dL 6.9 6.4(L) -  Total Bilirubin 0.3 - 1.2 mg/dL 0.6 0.8 -  Alkaline Phos 38 - 126 U/L 73 66 -  AST 15 - 41 U/L 14(L) 12(L) -  ALT 0 - 44 U/L 17 16 -     RADIOGRAPHIC STUDIES: I have personally reviewed the radiological images as listed and agreed with the findings in the report. CT ANGIO CHEST PE W OR WO CONTRAST  Result Date: 02/19/2020 CLINICAL DATA:  COVID positive. Acute respiratory failure with hypoxia. EXAM: CT ANGIOGRAPHY CHEST WITH CONTRAST TECHNIQUE: Multidetector CT imaging of the chest was performed using the standard protocol during bolus administration of intravenous contrast. Multiplanar CT image reconstructions and MIPs were obtained to evaluate the vascular anatomy. CONTRAST:  65m OMNIPAQUE IOHEXOL 350 MG/ML SOLN COMPARISON:  PET-CT August 15, 2019 and CT a chest November 14, 2018. FINDINGS: Cardiovascular: Satisfactory opacification of the pulmonary arteries to the mid segmental level. No evidence of pulmonary embolism. Cardiomegaly similar  prior. No pericardial effusion.  Left upper extremity PICC with tip in the right atrium. Mediastinum/Nodes: No significant change in the prominent/mildly enlarged mediastinal and axillary lymph nodes, for instance the mildly metabolic precarinal lymph node measures 1.5 cm in maximum axial diameter, and is unchanged when remeasured for consistency (series 5, image 33). Multinodular appearance of the thyroid with the largest nodule measuring 1.6 cm in the left lobe. The trachea and esophagus are otherwise unremarkable. Lungs/Pleura: Mosaic attenuation with multifocal ground-glass opacities and nodularity. No pleural effusion or pneumothorax. Upper Abdomen: No right upper pole renal neoplasm incompletely visualized on today's exam. Musculoskeletal: Multilevel degenerative changes spine. No suspicious lytic or blastic lesion of bone. Review of the MIP images confirms the above findings. IMPRESSION: 1. No evidence of pulmonary embolism. 2. Mosaic attenuation with multifocal ground-glass opacities and nodularity, which may represent multifocal pneumonia or edema. 3. No significant change in the prominent/mildly enlarged mediastinal and axillary lymph nodes, which may be reactive. 4. Enlarged multinodular appearance of the thyroid with a 1.6 cm left thyroid nodule, further evaluation with thyroid ultrasound could be utilized if not previously performed. Electronically Signed   By: Dahlia Bailiff MD   On: 02/19/2020 11:19   DG Chest Port 1 View  Result Date: 02/25/2020 CLINICAL DATA:  COVID, shortness of breath EXAM: PORTABLE CHEST 1 VIEW COMPARISON:  02/18/2020 FINDINGS: Right dialysis catheter remains in place, unchanged. Cardiomegaly. Bilateral airspace opacities are again noted, similar on the right to prior study, slightly increased in the left mid lung. No effusions or pneumothorax. IMPRESSION: Bilateral airspace disease, slightly worsening on the left since prior study. Stable cardiomegaly. Electronically Signed   By: Rolm Baptise M.D.   On:  02/25/2020 22:28   DG Chest Portable 1 View  Result Date: 02/18/2020 CLINICAL DATA:  Shortness of breath.  History of urologic carcinoma EXAM: PORTABLE CHEST 1 VIEW COMPARISON:  November 14, 2018 chest radiograph; PET-CT August 15, 2019. FINDINGS: Central catheter tip is in the right atrium. No pneumothorax. There is cardiomegaly with pulmonary venous hypertension. Small left pleural effusion noted. There is diffuse interstitial thickening, likely indicative of pulmonary edema. No consolidation. There is a nodular lesion in the left mid lung measuring 1.2 x 1.1 cm. There is a questionable nodular area overlying the inferior right hilum measuring 2.3 x 2.1 cm. No appreciable adenopathy by radiography.  No bone lesions. IMPRESSION: 1. Well-defined nodular opacity in left perihilar region measuring 1.2 x 1.1 cm. Questionable less well-defined nodular opacity right lower lung region medially measuring 2.3 x 2.1 cm. These findings raise concern for metastatic foci. Correlation with chest CT, ideally with intravenous contrast, advised to further evaluate. 2. Cardiomegaly with pulmonary vascular congestion. Diffuse interstitial prominence likely represents interstitial pulmonary edema. Suspect a degree of congestive heart failure. Note that lymphangitic spread of tumor potentially could present in this manner. CT may be helpful for further delineation in this regard. 3.  Central catheter tip in right atrium.  No pneumothorax. Electronically Signed   By: Lowella Grip III M.D.   On: 02/18/2020 12:10   VAS [place] LOWER EXTREMITY VENOUS (DVT)  Result Date: 02/19/2020  Lower Venous DVT Study Indications: Swelling, and Edema.  Comparison Study: no prior Performing Technologist: Abram Sander RVS  Examination Guidelines: A complete evaluation includes B-mode imaging, spectral Doppler, color Doppler, and power Doppler as needed of all accessible portions of each vessel. Bilateral testing is considered an integral part of a  complete examination. Limited examinations for reoccurring indications may be performed  as noted. The reflux portion of the exam is performed with the patient in reverse Trendelenburg.  +---------+---------------+---------+-----------+----------+--------------+ RIGHT    CompressibilityPhasicitySpontaneityPropertiesThrombus Aging +---------+---------------+---------+-----------+----------+--------------+ CFV      Full           Yes      Yes                                 +---------+---------------+---------+-----------+----------+--------------+ SFJ      Full                                                        +---------+---------------+---------+-----------+----------+--------------+ FV Prox  Full                                                        +---------+---------------+---------+-----------+----------+--------------+ FV Mid   Full                                                        +---------+---------------+---------+-----------+----------+--------------+ FV Distal               Yes      Yes                                 +---------+---------------+---------+-----------+----------+--------------+ PFV      Full                                                        +---------+---------------+---------+-----------+----------+--------------+ POP      Full           Yes      Yes                                 +---------+---------------+---------+-----------+----------+--------------+ PTV      Full                                                        +---------+---------------+---------+-----------+----------+--------------+ PERO     Full                                                        +---------+---------------+---------+-----------+----------+--------------+   +---------+---------------+---------+-----------+----------+-------------------+ LEFT     CompressibilityPhasicitySpontaneityPropertiesThrombus Aging       +---------+---------------+---------+-----------+----------+-------------------+ CFV      Full  Yes      Yes                                      +---------+---------------+---------+-----------+----------+-------------------+ SFJ      Full                                                             +---------+---------------+---------+-----------+----------+-------------------+ FV Prox  Full                                                             +---------+---------------+---------+-----------+----------+-------------------+ FV Mid   Full                                                             +---------+---------------+---------+-----------+----------+-------------------+ FV Distal               Yes      Yes                                      +---------+---------------+---------+-----------+----------+-------------------+ PFV      Full                                                             +---------+---------------+---------+-----------+----------+-------------------+ POP      Full           Yes      Yes                                      +---------+---------------+---------+-----------+----------+-------------------+ PTV      Full                                                             +---------+---------------+---------+-----------+----------+-------------------+ PERO                                                  Not well visualized +---------+---------------+---------+-----------+----------+-------------------+     Summary: BILATERAL: - No evidence of deep vein thrombosis seen in the lower extremities, bilaterally. - No evidence of superficial venous thrombosis in the lower extremities, bilaterally. -No evidence of popliteal cyst, bilaterally.   *See table(s) above for  measurements and observations. Electronically signed by Deitra Mayo MD on 02/19/2020 at 10:22:57 AM.    Final     ASSESSMENT & PLAN:    55 yo with   1) Axillary and mediastinal LNadenopathy 2) Splenic lesions R/o Lymphoma 3) Rt kidney mass ? RCC  PLAN: -Discussed pt labwork today, 03/13/2020; ***   -Will see back in ***  FOLLOW UP: ***  The total time spent in the appointment was *** minutes and more than 50% was on counseling and direct patient cares.  All of the patient's questions were answered with apparent satisfaction. The patient knows to call the clinic with any problems, questions or concerns.  Sullivan Lone MD [place] AAHIVMS [facility] Hematology/Oncology Physician [facility]  (Office):       [phone] (Work cell):  [phone] (Fax):           [phone]  03/12/2020 9:49 AM  I, Reinaldo Raddle, am acting as scribe for Dr. Sullivan Lone, MD.

## 2020-03-13 ENCOUNTER — Telehealth: Payer: Self-pay | Admitting: *Deleted

## 2020-03-13 ENCOUNTER — Inpatient Hospital Stay: Payer: Medicare Other | Attending: Hematology

## 2020-03-13 ENCOUNTER — Inpatient Hospital Stay: Payer: Medicare Other | Admitting: Hematology

## 2020-03-13 ENCOUNTER — Telehealth: Payer: Self-pay | Admitting: Hematology

## 2020-03-13 DIAGNOSIS — E1122 Type 2 diabetes mellitus with diabetic chronic kidney disease: Secondary | ICD-10-CM | POA: Insufficient documentation

## 2020-03-13 DIAGNOSIS — E7800 Pure hypercholesterolemia, unspecified: Secondary | ICD-10-CM | POA: Insufficient documentation

## 2020-03-13 DIAGNOSIS — Z87891 Personal history of nicotine dependence: Secondary | ICD-10-CM | POA: Insufficient documentation

## 2020-03-13 DIAGNOSIS — D631 Anemia in chronic kidney disease: Secondary | ICD-10-CM | POA: Insufficient documentation

## 2020-03-13 DIAGNOSIS — I509 Heart failure, unspecified: Secondary | ICD-10-CM | POA: Insufficient documentation

## 2020-03-13 DIAGNOSIS — H9209 Otalgia, unspecified ear: Secondary | ICD-10-CM | POA: Insufficient documentation

## 2020-03-13 DIAGNOSIS — E1151 Type 2 diabetes mellitus with diabetic peripheral angiopathy without gangrene: Secondary | ICD-10-CM | POA: Insufficient documentation

## 2020-03-13 DIAGNOSIS — M549 Dorsalgia, unspecified: Secondary | ICD-10-CM | POA: Insufficient documentation

## 2020-03-13 DIAGNOSIS — I2510 Atherosclerotic heart disease of native coronary artery without angina pectoris: Secondary | ICD-10-CM | POA: Insufficient documentation

## 2020-03-13 DIAGNOSIS — M1990 Unspecified osteoarthritis, unspecified site: Secondary | ICD-10-CM | POA: Insufficient documentation

## 2020-03-13 DIAGNOSIS — N186 End stage renal disease: Secondary | ICD-10-CM | POA: Insufficient documentation

## 2020-03-13 DIAGNOSIS — R161 Splenomegaly, not elsewhere classified: Secondary | ICD-10-CM | POA: Insufficient documentation

## 2020-03-13 DIAGNOSIS — Z794 Long term (current) use of insulin: Secondary | ICD-10-CM | POA: Insufficient documentation

## 2020-03-13 DIAGNOSIS — Z8616 Personal history of COVID-19: Secondary | ICD-10-CM | POA: Insufficient documentation

## 2020-03-13 DIAGNOSIS — I132 Hypertensive heart and chronic kidney disease with heart failure and with stage 5 chronic kidney disease, or end stage renal disease: Secondary | ICD-10-CM | POA: Insufficient documentation

## 2020-03-13 DIAGNOSIS — R590 Localized enlarged lymph nodes: Secondary | ICD-10-CM | POA: Insufficient documentation

## 2020-03-13 DIAGNOSIS — I340 Nonrheumatic mitral (valve) insufficiency: Secondary | ICD-10-CM | POA: Insufficient documentation

## 2020-03-13 DIAGNOSIS — Z79899 Other long term (current) drug therapy: Secondary | ICD-10-CM | POA: Insufficient documentation

## 2020-03-13 DIAGNOSIS — R011 Cardiac murmur, unspecified: Secondary | ICD-10-CM | POA: Insufficient documentation

## 2020-03-13 DIAGNOSIS — F419 Anxiety disorder, unspecified: Secondary | ICD-10-CM | POA: Insufficient documentation

## 2020-03-13 NOTE — Telephone Encounter (Signed)
Patient did not come to appts for lab and Dr. Irene Limbo on 2/10 Schedule message sent to contact patient and reschedule at her convenience

## 2020-03-13 NOTE — Telephone Encounter (Signed)
Called pt per 2/10 sch msg - no answer and no vmail .

## 2020-03-14 ENCOUNTER — Ambulatory Visit: Payer: Medicare Other | Admitting: Podiatry

## 2020-03-19 ENCOUNTER — Telehealth: Payer: Self-pay | Admitting: *Deleted

## 2020-03-19 NOTE — Telephone Encounter (Signed)
Patient LVM with office requesting to r/s appts missed on 2/10. Schedule message sent

## 2020-03-24 ENCOUNTER — Other Ambulatory Visit: Payer: Self-pay | Admitting: Cardiology

## 2020-03-26 NOTE — Progress Notes (Signed)
HEMATOLOGY/ONCOLOGY CONSULTATION NOTE  Date of Service: 03/27/2020  Patient Care Team: Sandi Mariscal, MD as PCP - General (Internal Medicine) Minus Breeding, MD as PCP - Cardiology (Cardiology) Leandrew Koyanagi, MD as Attending Physician (Orthopedic Surgery) [facility]  CHIEF COMPLAINTS/PURPOSE OF CONSULTATION:  Lymphadenopathy/Enlarged Spleen/Splenic Lesions  HISTORY OF PRESENTING ILLNESS:    INTERVAL HISTORY  [name] is a wonderful 55 y.o. female who is here today for evaluation and management of lymphadenopathy/ enlarged spleen / splenic lesions. The patient's last visit with [place] was on 08/15/2020. The pt reports that she is doing well overall. We are joined today by her niece and nephew.  The pt reports that she experienced a bad COVID infection that led to hospitalization multiple times in January 2022. The pt was not able to see her Nephrologist due to this infection and her appt being scheduled at the same time. The pt notes she has not seen her Rheumatologist yet either due to the sickness. She is currently on Humira for five months since the last visit. She notes no difference with this. The pt notes she did get a CT Abd at [facility] for her bad pain in her lower back around her kidney. The pt reports her breathing is improving, but notes some congestion. She has not seen a Pulmonologist yet at this time. The pt notes that she has pain in her ear and an irritated tongue for around 1.5 weeks that is worsening. She denies any dental problems or painful teeth.   The pt notes that she attempted a walk-in appointment with Dr. Tammi Klippel last Friday, but he was in surgery. She saw his partner who did not know her history nor was able to help her.  Lab results today 03/27/2020 of CBC w/diff and CMP is as follows: all values are WNL except for RBC of 2.76, Hgb of 9.0, HCT of 27.1, Chloride of 95, Glucose of 395, BUN of 39, Creatinine of 5.48, AST of 14,  Alkaline Phosphatase of 136, GFR est of 9. 03/27/2020 LDH of 222.  On review of systems, pt reports back pain, right inner ear pain and denies SOB, fevers, chills, abdominal pain, leg swelling, changes in bowel habits, diarrhea, flank pain, and any other symptoms.  MEDICAL HISTORY:  Past Medical History:  Diagnosis Date   Anemia    Anxiety    Arthritis    "knees" "hands", "RA"   CAD (coronary artery disease)    Nonobstructive on CT 2019   CHF (congestive heart failure) ([place])    COVID-19 10/2018   Dyspnea    "when I have too much fluid."   ESRD (end stage renal disease) ([place])    Dialysis TTHSat- 3rd st   Headache(784.0)    Heart murmur    High cholesterol    History of blood transfusion    Hypertension    Mitral regurgitation    moderate to severe MR 10/2018 echo   Nerve pain    "they say I have L5 nerve damage; my lumbar"   Pericardial effusion    Pneumonia    ; 11/16/2018- "touch of pneumonia" - seen in ED- 11/14/2018- on antibiotic. Has had it x 2   PVD (peripheral vascular disease) ([place])    Right leg stent in [place].  (No records)   Restless legs    Sleep apnea    does not use Cpap   Thoracic ascending aortic aneurysm (HCC)    4.4 cm 11/14/18 CTA  Type II diabetes mellitus ([place])     SURGICAL HISTORY: Past Surgical History:  Procedure Laterality Date   AMPUTATION Left 12/27/2018   Procedure: LEFT THUMB REVISION AMPUTATION DIGIT;  Surgeon: Charlotte Crumb, MD;  Location: [place];  Service: Orthopedics;  Laterality: Left;   AV FISTULA PLACEMENT Left    AV FISTULA PLACEMENT Right 03/12/2019   Procedure: INSERTION OF ARTERIOVENOUS (AV) GORE-TEX GRAFT ARM;  Surgeon: Elam Dutch, MD;  Location: [place];  Service: Vascular;  Laterality: Right;   AV FISTULA PLACEMENT Right 08/13/2019   Procedure: RIGHT UPPER ARM ARTERIOVENOUS (AV) FISTULA CREATION WITH BASILIC VEIN;  Surgeon: Elam Dutch, MD;  Location: [place];  Service: Vascular;   Laterality: Right;   [place]; 1997   x 2   COLONOSCOPY     EYE SURGERY Bilateral    cataract surgery   INSERTION OF DIALYSIS CATHETER Right 09/26/2018   Procedure: INSERTION OF DIALYSIS CATHETER Right Internal Jugular.;  Surgeon: Elam Dutch, MD;  Location: [place];  Service: Vascular;  Laterality: Right;   IR FLUORO GUIDE CV LINE RIGHT  05/04/2019   IR US GUIDE VASC ACCESS RIGHT  05/04/2019   LIGATION OF ARTERIOVENOUS  FISTULA Left 09/26/2018   Procedure: LIGATION OF ARTERIOVENOUS  FISTULA LEFT ARM;  Surgeon: Elam Dutch, MD;  Location: [place];  Service: Vascular;  Laterality: Left;   PERICARDIAL WINDOW  02/2004   for pericardial effusion   PERIPHERAL VASCULAR INTERVENTION Right 10/26/2019   Procedure: PERIPHERAL VASCULAR INTERVENTION;  Surgeon: Elam Dutch, MD;  Location: [place] [facility];  Service: Cardiovascular;  Laterality: Right;  arm  AV fistula   TUBAL LIGATION  05/1995   UPPER EXTREMITY VENOGRAPHY N/A 06/22/2019   Procedure: UPPER EXTREMITY VENOGRAPHY - Right Central;  Surgeon: Elam Dutch, MD;  Location: [place] [facility];  Service: Cardiovascular;  Laterality: N/A;    SOCIAL HISTORY: Social History   Socioeconomic History   Marital status: Married    Spouse name: Not on file   Number of children: Not on file   Years of education: Not on file   Highest education level: Not on file  Occupational History   Not on file  Tobacco Use   Smoking status: Former Smoker    Years: 33.00    Types: Cigarettes    Quit date: 07/17/2019    Years since quitting: 0.6   Smokeless tobacco: Never Used   Tobacco comment: using NIcotene gum, rare 1/2 cigarette  Vaping Use   Vaping Use: Never used  Substance and Sexual Activity   Alcohol use: No   Drug use: No   Sexual activity: Not Currently    Birth control/protection: Post-menopausal  Other Topics Concern   Not on file  Social History Narrative   Lives with son.         Social Determinants of Health   Financial Resource Strain: Not on file  Food Insecurity: Not on file  Transportation Needs: Not on file  Physical Activity: Not on file  Stress: Not on file  Social Connections: Not on file  Intimate Partner Violence: Not on file    FAMILY HISTORY: Family History  Problem Relation Age of Onset   Cancer Brother    Heart disease Father        Died age 70   Diabetes Father    Hyperlipidemia Father    Hypertension Father    Stroke Father    Diabetes Mother    Hypertension  Mother    Stroke Mother    Dementia Mother    Diabetes Sister    Diabetes Brother    Hypertension Sister    Hypertension Brother     ALLERGIES:  is allergic to bupropion.  MEDICATIONS:  Current Outpatient Medications  Medication Sig Dispense Refill   albuterol (VENTOLIN HFA) 108 (90 Base) MCG/ACT inhaler Inhale 2 puffs into the lungs every 6 (six) hours as needed for wheezing or shortness of breath. 6.7 g 0   AURYXIA 1 GM 210 MG(Fe) tablet Take 630-1,050 mg by mouth See admin instructions. Take 5 tablets (1050 mg) by mouth with each meal & take 3 tablets (630 mg) by mouth with each snack     b complex-vitamin c-folic acid (NEPHRO-VITE) 0.8 MG TABS tablet Take 1 tablet by mouth every Monday, Wednesday, and Friday with hemodialysis.     blood glucose meter kit and supplies KIT Dispense based on patient and insurance preference. Use up to four times daily as directed. (FOR ICD-9 250.00, 250.01). 1 each 0   calcitRIOL (ROCALTROL) 0.5 MCG capsule Take 0.5 mcg by mouth as directed. Given at Dialysis     hydrOXYzine (ATARAX/VISTARIL) 25 MG tablet Take 25 mg by mouth daily.     Insulin Lispro Prot & Lispro (HUMALOG 75/25 MIX) (75-25) 100 UNIT/ML Kwikpen Inject 25 Units into the skin 2 (two) times daily with a meal.     Methoxy PEG-Epoetin Beta (MIRCERA IJ) Mircera     metoprolol succinate (TOPROL-XL) 100 MG 24 hr tablet Take 100 mg by mouth daily. Take with  or immediately following a meal.     NARCAN 4 MG/0.1ML LIQD nasal spray kit Place 1 spray into the nose as needed (accidental overdose.).      Oxycodone HCl 10 MG TABS Take 10 mg by mouth 3 (three) times daily.     pramipexole (MIRAPEX) 0.5 MG tablet Take 0.5 mg by mouth daily.   1   rosuvastatin (CRESTOR) 40 MG tablet TAKE 1 TABLET BY MOUTH EVERY DAY *PATIENT NEEDS APPOINTMENT* 15 tablet 1   No current facility-administered medications for this visit.    REVIEW OF SYSTEMS:   10 Point review of Systems was done is negative except as noted above.  PHYSICAL EXAMINATION: ECOG PERFORMANCE STATUS: 2 - Symptomatic, <50% confined to bed  . Vitals:   03/27/20 1440  BP: (!) 155/56  Pulse: 83  Resp: 18  Temp: (!) 97 F (36.1 C)  SpO2: 97%   Filed Weights   03/27/20 1440  Weight: 244 lb 9.6 oz (110.9 kg)   .Body mass index is 37.19 kg/m.  Exam was given in a wheelchair.   GENERAL:alert, in no acute distress and comfortable SKIN: no acute rashes, no significant lesions EYES: conjunctiva are pink and non-injected, sclera anicteric OROPHARYNX: MMM, no exudates, no oropharyngeal erythema or ulceration NECK: supple, no JVD LYMPH:  no palpable lymphadenopathy in the cervical, axillary or inguinal regions LUNGS: clear to auscultation b/l with normal respiratory effort HEART: regular rate & rhythm ABDOMEN:  normoactive bowel sounds , non tender, not distended. Extremity: no pedal edema PSYCH: alert & oriented x 3 with fluent speech NEURO: no focal motor/sensory deficits   LABORATORY DATA:  I have reviewed the data as listed  CBC Latest Ref Rng & Units 03/27/2020 02/28/2020 02/27/2020  WBC 4.0 - 10.5 K/uL 6.3 7.3 7.8  Hemoglobin 12.0 - 15.0 g/dL 9.0(L) 8.4(L) 7.8(L)  Hematocrit 36.0 - 46.0 % 27.1(L) 25.2(L) 23.8(L)  Platelets 150 - 400 K/uL  208 157 155    CMP Latest Ref Rng & Units 03/27/2020 02/28/2020 02/27/2020  Glucose 70 - 99 mg/dL 395(H) 248(H) 242(H)  BUN 6 - 20 mg/dL  39(H) 120(H) 90(H)  Creatinine 0.44 - 1.00 mg/dL 5.48(HH) 9.40(H) 8.09(H)  Sodium 135 - 145 mmol/L 139 132(L) 134(L)  Potassium 3.5 - 5.1 mmol/L 4.6 5.4(H) 4.9  Chloride 98 - 111 mmol/L 95(L) 92(L) 92(L)  CO2 22 - 32 mmol/L _0 Calcium 8.9 - 10.3 mg/dL 9.9 9.3 9.0  Total Protein 6.5 - 8.1 g/dL 8.1 6.9 6.4(L)  Total Bilirubin 0.3 - 1.2 mg/dL 0.3 0.6 0.8  Alkaline Phos 38 - 126 U/L 136(H) 73 66  AST 15 - 41 U/L 14(L) 14(L) 12(L)  ALT 0 - 44 U/L _1 RADIOGRAPHIC STUDIES: I have personally reviewed the radiological images as listed and agreed with the findings in the report. No results found.  ASSESSMENT & PLAN:   55 yo with   1) Axillary and mediastinal LNadenopathy- likely reactive.  No significant lymphadenopathy on recent CT of the chest on 02/19/2020.  Borderline lymph nodes likely reactive in the setting of Covid infection.  These were likely reactive from her rheumatoid arthritis which she is now on Humira for. 2) Splenic lesions -PET scan suggested benign non-FDG avid lesions 3) Rt kidney mass ? RCC 4) uncontrolled diabetes-to follow-up with with endocrinologist/PCP. 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 Hgb improved since last month. WBC and Plt normal. Blood sugars very elevated. -Recommended pt to see Urologist for evaluation and mx nodule in right upper kidney on prior PET from July 2021. She sees Dr. Tresa Moore. -Recommended pt continue f/u w Rheumatologist regarding the lung changes found that could be due to Rheumatoid Arthritis causing inflammation. -Recommend pt f/u w Endocrinologist as scheduled for managing diabetes. The pt will see one at [facility] next week. -Recommend pt contact PCP ASAP regarding blood sugar levels.  -Advised pt that elevated levels of blood sugars can cause infections. -Will attempt to get results from CT at [facility] last week doen by PCP. -Recommended salt and baking soda  mouthwashes. Mix 0.5L water, 1 tsp salt, 1 tsp baking soda.for mouth sores. -Recommended pt start a daily Vitamin B complex.  -Will see back as needed pending results from CT.    The total time spent in the appointment was 30 minutes and more than 50% was on counseling and direct patient cares.  All of the patient's questions were answered with apparent satisfaction. The patient knows to call the clinic with any problems, questions or concerns.  Sullivan Lone MD [place] AAHIVMS [facility] Hematology/Oncology [facility]  (Office):       [phone] (Work cell):  [phone] (Fax):           [phone]  03/27/2020 3:46 PM  I, Reinaldo Raddle, am acting as scribe for Dr. Sullivan Lone, MD.     .I have reviewed the above documentation for accuracy and completeness, and I agree with the above. Brunetta Genera MD

## 2020-03-27 ENCOUNTER — Inpatient Hospital Stay: Payer: Medicare Other

## 2020-03-27 ENCOUNTER — Inpatient Hospital Stay (HOSPITAL_BASED_OUTPATIENT_CLINIC_OR_DEPARTMENT_OTHER): Payer: Medicare Other | Admitting: Hematology

## 2020-03-27 ENCOUNTER — Other Ambulatory Visit: Payer: Self-pay

## 2020-03-27 VITALS — BP 155/56 | HR 83 | Temp 97.0°F | Resp 18 | Ht 68.0 in | Wt 244.6 lb

## 2020-03-27 DIAGNOSIS — R591 Generalized enlarged lymph nodes: Secondary | ICD-10-CM | POA: Diagnosis not present

## 2020-03-27 DIAGNOSIS — E7800 Pure hypercholesterolemia, unspecified: Secondary | ICD-10-CM | POA: Diagnosis not present

## 2020-03-27 DIAGNOSIS — Z87891 Personal history of nicotine dependence: Secondary | ICD-10-CM | POA: Diagnosis not present

## 2020-03-27 DIAGNOSIS — Z8616 Personal history of COVID-19: Secondary | ICD-10-CM | POA: Diagnosis not present

## 2020-03-27 DIAGNOSIS — F419 Anxiety disorder, unspecified: Secondary | ICD-10-CM | POA: Diagnosis not present

## 2020-03-27 DIAGNOSIS — R161 Splenomegaly, not elsewhere classified: Secondary | ICD-10-CM | POA: Diagnosis present

## 2020-03-27 DIAGNOSIS — R590 Localized enlarged lymph nodes: Secondary | ICD-10-CM | POA: Diagnosis not present

## 2020-03-27 DIAGNOSIS — I132 Hypertensive heart and chronic kidney disease with heart failure and with stage 5 chronic kidney disease, or end stage renal disease: Secondary | ICD-10-CM | POA: Diagnosis not present

## 2020-03-27 DIAGNOSIS — I340 Nonrheumatic mitral (valve) insufficiency: Secondary | ICD-10-CM | POA: Diagnosis not present

## 2020-03-27 DIAGNOSIS — H9209 Otalgia, unspecified ear: Secondary | ICD-10-CM | POA: Diagnosis not present

## 2020-03-27 DIAGNOSIS — Z79899 Other long term (current) drug therapy: Secondary | ICD-10-CM | POA: Diagnosis not present

## 2020-03-27 DIAGNOSIS — Z794 Long term (current) use of insulin: Secondary | ICD-10-CM | POA: Diagnosis not present

## 2020-03-27 DIAGNOSIS — D631 Anemia in chronic kidney disease: Secondary | ICD-10-CM | POA: Diagnosis not present

## 2020-03-27 DIAGNOSIS — R011 Cardiac murmur, unspecified: Secondary | ICD-10-CM | POA: Diagnosis not present

## 2020-03-27 DIAGNOSIS — M549 Dorsalgia, unspecified: Secondary | ICD-10-CM | POA: Diagnosis not present

## 2020-03-27 DIAGNOSIS — I2510 Atherosclerotic heart disease of native coronary artery without angina pectoris: Secondary | ICD-10-CM | POA: Diagnosis not present

## 2020-03-27 DIAGNOSIS — M1990 Unspecified osteoarthritis, unspecified site: Secondary | ICD-10-CM | POA: Diagnosis not present

## 2020-03-27 DIAGNOSIS — N186 End stage renal disease: Secondary | ICD-10-CM | POA: Diagnosis not present

## 2020-03-27 DIAGNOSIS — E1151 Type 2 diabetes mellitus with diabetic peripheral angiopathy without gangrene: Secondary | ICD-10-CM | POA: Diagnosis not present

## 2020-03-27 DIAGNOSIS — E1122 Type 2 diabetes mellitus with diabetic chronic kidney disease: Secondary | ICD-10-CM | POA: Diagnosis not present

## 2020-03-27 DIAGNOSIS — I509 Heart failure, unspecified: Secondary | ICD-10-CM | POA: Diagnosis not present

## 2020-03-27 LAB — CMP (CANCER CENTER ONLY)
ALT: 15 U/L (ref 0–44)
AST: 14 U/L — ABNORMAL LOW (ref 15–41)
Albumin: 3.6 g/dL (ref 3.5–5.0)
Alkaline Phosphatase: 136 U/L — ABNORMAL HIGH (ref 38–126)
Anion gap: 15 (ref 5–15)
BUN: 39 mg/dL — ABNORMAL HIGH (ref 6–20)
CO2: 29 mmol/L (ref 22–32)
Calcium: 9.9 mg/dL (ref 8.9–10.3)
Chloride: 95 mmol/L — ABNORMAL LOW (ref 98–111)
Creatinine: 5.48 mg/dL (ref 0.44–1.00)
GFR, Estimated: 9 mL/min — ABNORMAL LOW (ref >60)
Glucose, Bld: 395 mg/dL — ABNORMAL HIGH (ref 70–99)
Potassium: 4.6 mmol/L (ref 3.5–5.1)
Sodium: 139 mmol/L (ref 135–145)
Total Bilirubin: 0.3 mg/dL (ref 0.3–1.2)
Total Protein: 8.1 g/dL (ref 6.5–8.1)

## 2020-03-27 LAB — CBC WITH DIFFERENTIAL/PLATELET
Abs Immature Granulocytes: 0.05 10*3/uL (ref 0.00–0.07)
Basophils Absolute: 0.1 10*3/uL (ref 0.0–0.1)
Basophils Relative: 1 %
Eosinophils Absolute: 0.2 10*3/uL (ref 0.0–0.5)
Eosinophils Relative: 4 %
HCT: 27.1 % — ABNORMAL LOW (ref 36.0–46.0)
Hemoglobin: 9 g/dL — ABNORMAL LOW (ref 12.0–15.0)
Immature Granulocytes: 1 %
Lymphocytes Relative: 22 %
Lymphs Abs: 1.4 10*3/uL (ref 0.7–4.0)
MCH: 32.6 pg (ref 26.0–34.0)
MCHC: 33.2 g/dL (ref 30.0–36.0)
MCV: 98.2 fL (ref 80.0–100.0)
Monocytes Absolute: 0.6 10*3/uL (ref 0.1–1.0)
Monocytes Relative: 9 %
Neutro Abs: 4 10*3/uL (ref 1.7–7.7)
Neutrophils Relative %: 63 %
Platelets: 208 10*3/uL (ref 150–400)
RBC: 2.76 MIL/uL — ABNORMAL LOW (ref 3.87–5.11)
RDW: 14.1 % (ref 11.5–15.5)
WBC: 6.3 10*3/uL (ref 4.0–10.5)
nRBC: 0 % (ref 0.0–0.2)

## 2020-03-27 LAB — LACTATE DEHYDROGENASE: LDH: 222 U/L — ABNORMAL HIGH (ref 98–192)

## 2020-03-27 NOTE — Progress Notes (Signed)
CRITICAL VALUE STICKER  CRITICAL VALUE:Creatinine 5.48 RECEIVER (on-site recipient of call):Sandi, RN DATE & TIME NOTIFIED: 03/27/20; 1510 MESSENGER (representative from lab):Hillary MD NOTIFIED: Dr.Kale TIME OF NOTIFICATION: 0404 RESPONSE: Information Acknowledged/ no new orders

## 2020-04-08 ENCOUNTER — Institutional Professional Consult (permissible substitution): Payer: Medicare Other | Admitting: Neurology

## 2020-04-08 ENCOUNTER — Telehealth: Payer: Self-pay

## 2020-04-08 NOTE — Telephone Encounter (Signed)
Okay to reschedule, as she was late.

## 2020-04-08 NOTE — Telephone Encounter (Signed)
Pt arrived past the required check in time for today's sleep consult visit. This technically counts as her second ns. Pt no showed back on 12/19/19 for sleep consult with Dr. Rexene Alberts. Will fwd to provider.

## 2020-04-10 ENCOUNTER — Ambulatory Visit (INDEPENDENT_AMBULATORY_CARE_PROVIDER_SITE_OTHER): Payer: Medicare Other | Admitting: Endocrinology

## 2020-04-10 ENCOUNTER — Other Ambulatory Visit: Payer: Self-pay

## 2020-04-10 DIAGNOSIS — E041 Nontoxic single thyroid nodule: Secondary | ICD-10-CM | POA: Diagnosis not present

## 2020-04-10 LAB — T4, FREE: Free T4: 1.01 ng/dL (ref 0.60–1.60)

## 2020-04-10 LAB — TSH: TSH: 2.08 u[IU]/mL (ref 0.35–4.50)

## 2020-04-10 MED ORDER — ONETOUCH VERIO VI STRP: 1.0000 | ORAL_STRIP | Freq: Two times a day (BID) | 3 refills | Status: DC

## 2020-04-10 NOTE — Patient Instructions (Addendum)
Blood tests are requested for you today.  We'll let you know about the results.  Let's check the ultrasound.  you will receive a phone call, about a day and time for an appointment. Here is a new meter.  I have sent a prescription to your pharmacy, for strips.   check your blood sugar twice a day.  vary the time of day when you check, between before the 3 meals, and at bedtime.  also check if you have symptoms of your blood sugar being too high or too low.  please keep a record of the readings and bring it to your next appointment here (or you can bring the meter itself).  You can write it on any piece of paper.  please call us sooner if your blood sugar goes below 70, or if most of your readings are over 200. Please come back for a follow-up appointment in 2 weeks.  We'll address the diabetes then, also.

## 2020-04-10 NOTE — Progress Notes (Signed)
Subjective:    Patient ID: [name], female    DOB: 15-Jul-1965, 55 y.o.   MRN: [id number]  HPI Pt is referred by Dr Royce Macadamia, for nodular thyroid.  Pt says I saw her many years ago, but this must have predated EMR.  Pt was noted to have a thyroid nodule in 1/22.  she is unaware of ever having had thyroid problems in the past.  she has no h/o XRT or surgery to the neck.   Pt also requests to f/u DM here.   Past Medical History:  Diagnosis Date  . Anemia   . Anxiety   . Arthritis    "knees" "hands", "RA"  . CAD (coronary artery disease)    Nonobstructive on CT 2019  . CHF (congestive heart failure) ([place])   . COVID-19 10/2018  . Dyspnea    "when I have too much fluid."  . ESRD (end stage renal disease) ([place])    Dialysis TTHSat- 3rd st  . Headache(784.0)   . Heart murmur   . High cholesterol   . History of blood transfusion   . Hypertension   . Mitral regurgitation    moderate to severe MR 10/2018 echo  . 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 Pericardial effusion   . Pneumonia    ; 11/16/2018- "touch of pneumonia" - seen in ED- 11/14/2018- on antibiotic. Has had it x 2  . PVD (peripheral vascular disease) ([place])    Right leg stent in [place].  (No records)  . Restless legs   . Sleep apnea    does not use Cpap  . Thoracic ascending aortic aneurysm (HCC)    4.4 cm 11/14/18 CTA  . Type II diabetes mellitus ([place])     Past Surgical History:  Procedure Laterality Date  . AMPUTATION Left 12/27/2018   Procedure: LEFT THUMB REVISION AMPUTATION DIGIT;  Surgeon: Charlotte Crumb, MD;  Location: [place];  Service: Orthopedics;  Laterality: Left;  . AV FISTULA PLACEMENT Left   . AV FISTULA PLACEMENT Right 03/12/2019   Procedure: INSERTION OF ARTERIOVENOUS (AV) GORE-TEX GRAFT ARM;  Surgeon: Elam Dutch, MD;  Location: [facility];  Service: Vascular;  Laterality: Right;  . AV FISTULA PLACEMENT Right 08/13/2019   Procedure: RIGHT UPPER ARM ARTERIOVENOUS  (AV) FISTULA CREATION WITH BASILIC VEIN;  Surgeon: Elam Dutch, MD;  Location: [place];  Service: Vascular;  Laterality: Right;  . [place]; 1997   x 2  . COLONOSCOPY    . EYE SURGERY Bilateral    cataract surgery  . INSERTION OF DIALYSIS CATHETER Right 09/26/2018   Procedure: INSERTION OF DIALYSIS CATHETER Right Internal Jugular.;  Surgeon: Elam Dutch, MD;  Location: [place];  Service: Vascular;  Laterality: Right;  . IR FLUORO GUIDE CV LINE RIGHT  05/04/2019  . IR US GUIDE VASC ACCESS RIGHT  05/04/2019  . LIGATION OF ARTERIOVENOUS  FISTULA Left 09/26/2018   Procedure: LIGATION OF ARTERIOVENOUS  FISTULA LEFT ARM;  Surgeon: Elam Dutch, MD;  Location: [place];  Service: Vascular;  Laterality: Left;  . PERICARDIAL WINDOW  02/2004   for pericardial effusion  . PERIPHERAL VASCULAR INTERVENTION Right 10/26/2019   Procedure: PERIPHERAL VASCULAR INTERVENTION;  Surgeon: Elam Dutch, MD;  Location: [place] [facility];  Service: Cardiovascular;  Laterality: Right;  arm  AV fistula  . TUBAL LIGATION  05/1995  . UPPER EXTREMITY VENOGRAPHY N/A 06/22/2019   Procedure:  UPPER EXTREMITY VENOGRAPHY - Right Central;  Surgeon: Elam Dutch, MD;  Location: [place] [facility];  Service: Cardiovascular;  Laterality: N/A;    Social History   Socioeconomic History  . Marital status: Married    Spouse name: Not on file  . Number of children: Not on file  . Years of education: Not on file  . Highest education level: Not on file  Occupational History  . Not on file  Tobacco Use  . Smoking status: Former Smoker    Years: 33.00    Types: Cigarettes    Quit date: 07/17/2019    Years since quitting: 0.7  . Smokeless tobacco: Never Used  . Tobacco comment: using NIcotene gum, rare 1/2 cigarette  Vaping Use  . Vaping Use: Never used  Substance and Sexual Activity  . Alcohol use: No  . Drug use: No  . Sexual activity: Not Currently    Birth control/protection: Post-menopausal   Other Topics Concern  . Not on file  Social History Narrative   Lives with son.        Social Determinants of Health   Financial Resource Strain: Not on file  Food Insecurity: Not on file  Transportation Needs: Not on file  Physical Activity: Not on file  Stress: Not on file  Social Connections: Not on file  Intimate Partner Violence: Not on file    Current Outpatient Medications on File Prior to Visit  Medication Sig Dispense Refill  . albuterol (VENTOLIN HFA) 108 (90 Base) MCG/ACT inhaler Inhale 2 puffs into the lungs every 6 (six) hours as needed for wheezing or shortness of breath. 6.7 g 0  . AURYXIA 1 GM 210 MG(Fe) tablet Take 630-1,050 mg by mouth See admin instructions. Take 5 tablets (1050 mg) by mouth with each meal & take 3 tablets (630 mg) by mouth with each snack    . b complex-vitamin c-folic acid (NEPHRO-VITE) 0.8 MG TABS tablet Take 1 tablet by mouth every Monday, Wednesday, and Friday with hemodialysis.    Marland Kitchen calcitRIOL (ROCALTROL) 0.5 MCG capsule Take 0.5 mcg by mouth as directed. Given at Dialysis    . hydrOXYzine (ATARAX/VISTARIL) 25 MG tablet Take 25 mg by mouth daily.    . Insulin Lispro Prot & Lispro (HUMALOG 75/25 MIX) (75-25) 100 UNIT/ML Kwikpen Inject 25 Units into the skin 2 (two) times daily with a meal.    . Methoxy PEG-Epoetin Beta (MIRCERA IJ) Mircera    . metoprolol succinate (TOPROL-XL) 100 MG 24 hr tablet Take 100 mg by mouth daily. Take with or immediately following a meal.    . NARCAN 4 MG/0.1ML LIQD nasal spray kit Place 1 spray into the nose as needed (accidental overdose.).     Marland Kitchen Oxycodone HCl 10 MG TABS Take 10 mg by mouth 3 (three) times daily.    . pramipexole (MIRAPEX) 0.5 MG tablet Take 0.5 mg by mouth daily.   1  . rosuvastatin (CRESTOR) 40 MG tablet TAKE 1 TABLET BY MOUTH EVERY DAY *PATIENT NEEDS APPOINTMENT* 15 tablet 1   No current facility-administered medications on file prior to visit.    Allergies  Allergen Reactions  .  Bupropion Itching    Family History  Problem Relation Age of Onset  . Cancer Brother   . Heart disease Father        Died age 20  . Diabetes Father   . Hyperlipidemia Father   . Hypertension Father   . Stroke Father   . Diabetes Mother   .  Hypertension Mother   . Stroke Mother   . Dementia Mother   . Diabetes Sister   . Diabetes Brother   . Hypertension Sister   . Hypertension Brother     BP 110/60 (BP Location: Right Arm, Patient Position: Sitting, Cuff Size: Large)   Pulse 74   Ht 5' 8"  (1.727 m)   Wt 239 lb (108.4 kg)   LMP  (LMP Unknown) Comment: LMP Feb 2011  SpO2 92%   BMI 36.34 kg/m     Review of Systems Denies hoarseness, neck pain, sob, diarrhea, and flushing.        Objective:   Physical Exam VITAL SIGNS:  See vs page GENERAL: no distress.  In [place].   NECK: There is no palpable thyroid enlargement.  No thyroid nodule is palpable.  No palpable lymphadenopathy at the anterior neck.   Lab Results  Component Value Date   TSH 2.08 04/10/2020    outside test results are reviewed:  CT: Enlarged multinodular appearance of the thyroid with a 1.6 cm left thyroid nodule.     I have reviewed outside records, and summarized: Pt was noted to have elevated A1c, and referred here.  Pt had a CT to eval SOB, and thyroid nodule was incidentally noted    Assessment & Plan:  MNG, new to me, uncertain etiology and prognosis.   Type 2 DM.  We discussed.  Pt says she'll check some cbg's, and we'll discuss further at next ov.   Patient Instructions  Blood tests are requested for you today.  We'll let you know about the results.  Let's check the ultrasound.  you will receive a phone call, about a day and time for an appointment. Here is a new meter.  I have sent a prescription to your pharmacy, for strips.   check your blood sugar twice a day.  vary the time of day when you check, between before the 3 meals, and at bedtime.  also check if you have symptoms of your blood  sugar being too high or too low.  please keep a record of the readings and bring it to your next appointment here (or you can bring the meter itself).  You can write it on any piece of paper.  please call us sooner if your blood sugar goes below 70, or if most of your readings are over 200. Please come back for a follow-up appointment in 2 weeks.  We'll address the diabetes then, also.

## 2020-04-16 ENCOUNTER — Telehealth: Payer: Self-pay | Admitting: *Deleted

## 2020-04-16 NOTE — Telephone Encounter (Signed)
Patient already been rescheduled.

## 2020-04-16 NOTE — Telephone Encounter (Signed)
At MD request and with patient verbal permission, requested results from [facility] [phone] for CT on 03/20/20. Received report via fax. Per note on fax cover sheet, images can be mailed to office as they are not connected to PACS or Power Share.

## 2020-04-23 ENCOUNTER — Emergency Department (HOSPITAL_COMMUNITY): Payer: Medicare Other

## 2020-04-23 ENCOUNTER — Emergency Department (HOSPITAL_COMMUNITY)
Admission: EM | Admit: 2020-04-23 | Discharge: 2020-04-23 | Disposition: A | Discharge status: Home / Self Care | Payer: Medicare Other | Attending: Emergency Medicine | Admitting: Emergency Medicine

## 2020-04-23 ENCOUNTER — Other Ambulatory Visit: Payer: Self-pay

## 2020-04-23 ENCOUNTER — Encounter (HOSPITAL_COMMUNITY): Payer: Self-pay

## 2020-04-23 DIAGNOSIS — Z87891 Personal history of nicotine dependence: Secondary | ICD-10-CM | POA: Insufficient documentation

## 2020-04-23 DIAGNOSIS — N186 End stage renal disease: Secondary | ICD-10-CM | POA: Insufficient documentation

## 2020-04-23 DIAGNOSIS — E1140 Type 2 diabetes mellitus with diabetic neuropathy, unspecified: Secondary | ICD-10-CM | POA: Insufficient documentation

## 2020-04-23 DIAGNOSIS — W19XXXA Unspecified fall, initial encounter: Secondary | ICD-10-CM | POA: Insufficient documentation

## 2020-04-23 DIAGNOSIS — I509 Heart failure, unspecified: Secondary | ICD-10-CM | POA: Diagnosis not present

## 2020-04-23 DIAGNOSIS — I2510 Atherosclerotic heart disease of native coronary artery without angina pectoris: Secondary | ICD-10-CM | POA: Diagnosis not present

## 2020-04-23 DIAGNOSIS — I132 Hypertensive heart and chronic kidney disease with heart failure and with stage 5 chronic kidney disease, or end stage renal disease: Secondary | ICD-10-CM | POA: Insufficient documentation

## 2020-04-23 DIAGNOSIS — Z79899 Other long term (current) drug therapy: Secondary | ICD-10-CM | POA: Insufficient documentation

## 2020-04-23 DIAGNOSIS — S0990XA Unspecified injury of head, initial encounter: Secondary | ICD-10-CM | POA: Insufficient documentation

## 2020-04-23 DIAGNOSIS — E1122 Type 2 diabetes mellitus with diabetic chronic kidney disease: Secondary | ICD-10-CM | POA: Insufficient documentation

## 2020-04-23 DIAGNOSIS — H43391 Other vitreous opacities, right eye: Secondary | ICD-10-CM | POA: Insufficient documentation

## 2020-04-23 DIAGNOSIS — Z8616 Personal history of COVID-19: Secondary | ICD-10-CM | POA: Insufficient documentation

## 2020-04-23 DIAGNOSIS — Z794 Long term (current) use of insulin: Secondary | ICD-10-CM | POA: Insufficient documentation

## 2020-04-23 DIAGNOSIS — Z992 Dependence on renal dialysis: Secondary | ICD-10-CM | POA: Diagnosis not present

## 2020-04-23 LAB — CBG MONITORING, ED: Glucose-Capillary: 160 mg/dL — ABNORMAL HIGH (ref 70–99)

## 2020-04-23 NOTE — ED Provider Notes (Signed)
[facility] EMERGENCY DEPARTMENT Provider Note   CSN: [id number] Arrival date & time: 04/23/20  1536     History No chief complaint on file.   [name] is a 55 y.o. female.  Pt presents to the ED today with a red floater behind right eye.  Pt said she fell around 0300 and has had the floater since then.  Pt denies any change to her vision.  Pt has multiple medical problems including DM, HTN, and ESRD on HD.  She did go to dialysis today.  They sent her here after the session was over.        Past Medical History:  Diagnosis Date  . Anemia   . Anxiety   . Arthritis    "knees" "hands", "RA"  . CAD (coronary artery disease)    Nonobstructive on CT 2019  . CHF (congestive heart failure) ([place])   . COVID-19 10/2018  . Dyspnea    "when I have too much fluid."  . ESRD (end stage renal disease) ([place])    Dialysis TTHSat- 3rd st  . Headache(784.0)   . Heart murmur   . High cholesterol   . History of blood transfusion   . Hypertension   . Mitral regurgitation    moderate to severe MR 10/2018 echo  . 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 Pericardial effusion   . Pneumonia    ; 11/16/2018- "touch of pneumonia" - seen in ED- 11/14/2018- on antibiotic. Has had it x 2  . PVD (peripheral vascular disease) ([place])    Right leg stent in [place].  (No records)  . Restless legs   . Sleep apnea    does not use Cpap  . Thoracic ascending aortic aneurysm (HCC)    4.4 cm 11/14/18 CTA  . Type II diabetes mellitus [facility])     Patient Active Problem List   Diagnosis Date Noted  . Thyroid nodule 04/10/2020  . Acute respiratory failure ([place]) 02/26/2020  . Chronic pain 03/20/2019  . Preop cardiovascular exam 12/19/2018  . Dyslipidemia 11/20/2018  . Shortness of breath 10/23/2018  . Acute respiratory distress 10/23/2018  . Chronic bilateral pleural effusions 10/23/2018  . Obesity 10/23/2018  . Coronary artery disease 10/23/2018  .  Tobacco abuse 10/23/2018  . Volume overload 10/23/2018  . Unspecified protein-calorie malnutrition ([place]) 10/11/2018  . Pneumonia 10/10/2018  . COVID-19 virus detected 10/10/2018  . Acute encephalopathy 10/09/2018  . End-stage renal disease on hemodialysis ([place]) 09/27/2018  . Acute respiratory failure with hypoxia ([place]) 09/26/2018  . Chronic pain disorder 09/26/2018  . Other encephalopathy 09/26/2018  . COVID-19 virus infection 09/18/2018  . Acute metabolic encephalopathy 16/94/5038  . Gangrene ([place]) 09/18/2018  . Allergy, unspecified, initial encounter 09/04/2018  . Anaphylactic shock, unspecified, initial encounter 09/04/2018  . Left arm pain 06/27/2018  . Educated about COVID-19 virus infection 06/27/2018  . Iron deficiency anemia, unspecified 01/10/2018  . Coagulation defect, unspecified ([place]) 12/06/2017  . Diarrhea, unspecified 10/11/2017  . Anemia 09/26/2017  . Left hip pain 09/26/2017  . Neck pain 09/26/2017  . Leukocytosis 09/26/2017  . Pruritus, unspecified 09/22/2017  . Secondary hyperparathyroidism of renal origin ([place]) 09/01/2017  . Disorder of phosphorus metabolism, unspecified 08/25/2017  . Anemia in chronic kidney disease 08/24/2017  . Chest pain 07/07/2017  . Left ventricular hypertrophy 06/17/2017  . ESRD on dialysis ([place]) 05/21/2011  . Diabetic retinopathy 05/21/2011  . Diastolic dysfunction 88/28/0034  . Hyperlipidemia  01/30/2011  . Type II diabetes mellitus with complication, uncontrolled ([place]) 01/29/2011  . Neuropathy 01/29/2011  . Hypertension     Past Surgical History:  Procedure Laterality Date  . AMPUTATION Left 12/27/2018   Procedure: LEFT THUMB REVISION AMPUTATION DIGIT;  Surgeon: Charlotte Crumb, MD;  Location: [facility];  Service: Orthopedics;  Laterality: Left;  . AV FISTULA PLACEMENT Left   . AV FISTULA PLACEMENT Right 03/12/2019   Procedure: INSERTION OF ARTERIOVENOUS (AV) GORE-TEX GRAFT ARM;  Surgeon: Elam Dutch, MD;  Location: [facility] OR;   Service: Vascular;  Laterality: Right;  . AV FISTULA PLACEMENT Right 08/13/2019   Procedure: RIGHT UPPER ARM ARTERIOVENOUS (AV) FISTULA CREATION WITH BASILIC VEIN;  Surgeon: Elam Dutch, MD;  Location: [facility];  Service: Vascular;  Laterality: Right;  . [place]; 1997   x 2  . COLONOSCOPY    . EYE SURGERY Bilateral    cataract surgery  . INSERTION OF DIALYSIS CATHETER Right 09/26/2018   Procedure: INSERTION OF DIALYSIS CATHETER Right Internal Jugular.;  Surgeon: Elam Dutch, MD;  Location: [facility];  Service: Vascular;  Laterality: Right;  . IR FLUORO GUIDE CV LINE RIGHT  05/04/2019  . IR US GUIDE VASC ACCESS RIGHT  05/04/2019  . LIGATION OF ARTERIOVENOUS  FISTULA Left 09/26/2018   Procedure: LIGATION OF ARTERIOVENOUS  FISTULA LEFT ARM;  Surgeon: Elam Dutch, MD;  Location: [facility];  Service: Vascular;  Laterality: Left;  . PERICARDIAL WINDOW  02/2004   for pericardial effusion  . PERIPHERAL VASCULAR INTERVENTION Right 10/26/2019   Procedure: PERIPHERAL VASCULAR INTERVENTION;  Surgeon: Elam Dutch, MD;  Location: [facility] CV LAB;  Service: Cardiovascular;  Laterality: Right;  arm  AV fistula  . TUBAL LIGATION  05/1995  . UPPER EXTREMITY VENOGRAPHY N/A 06/22/2019   Procedure: UPPER EXTREMITY VENOGRAPHY - Right Central;  Surgeon: Elam Dutch, MD;  Location: [place] CV LAB;  Service: Cardiovascular;  Laterality: N/A;     OB History   No obstetric history on file.     Family History  Problem Relation Age of Onset  . Cancer Brother   . Heart disease Father        Died age 60  . Diabetes Father   . Hyperlipidemia Father   . Hypertension Father   . Stroke Father   . Diabetes Mother   . Hypertension Mother   . Stroke Mother   . Dementia Mother   . Diabetes Sister   . Diabetes Brother   . Hypertension Sister   . Hypertension Brother     Social History   Tobacco Use  . Smoking status: Former Smoker    Years: 33.00    Types: Cigarettes     Quit date: 07/17/2019    Years since quitting: 0.7  . Smokeless tobacco: Never Used  . Tobacco comment: using NIcotene gum, rare 1/2 cigarette  Vaping Use  . Vaping Use: Never used  Substance Use Topics  . Alcohol use: No  . Drug use: No    Home Medications Prior to Admission medications   Medication Sig Start Date End Date Taking? Authorizing Provider  albuterol (VENTOLIN HFA) 108 (90 Base) MCG/ACT inhaler Inhale 2 puffs into the lungs every 6 (six) hours as needed for wheezing or shortness of breath. 02/22/20   Thurnell Lose, MD  AURYXIA 1 GM 210 MG(Fe) tablet Take 630-1,050 mg by mouth See admin instructions. Take 5 tablets (1050 mg) by mouth with each meal &  take 3 tablets (630 mg) by mouth with each snack 05/29/18   [provider]  b complex-vitamin c-folic acid (NEPHRO-VITE) 0.8 MG TABS tablet Take 1 tablet by mouth every Monday, Wednesday, and Friday with hemodialysis. 04/12/19   [provider]  calcitRIOL (ROCALTROL) 0.5 MCG capsule Take 0.5 mcg by mouth as directed. Given at Dialysis    [provider]  glucose blood (ONETOUCH VERIO) test strip 1 each by Other route 2 (two) times daily. And lancets 2/day 04/10/20   Renato Shin, MD  hydrOXYzine (ATARAX/VISTARIL) 25 MG tablet Take 25 mg by mouth daily.    [provider]  Insulin Lispro Prot & Lispro (HUMALOG 75/25 MIX) (75-25) 100 UNIT/ML Kwikpen Inject 25 Units into the skin 2 (two) times daily with a meal.    [provider]  Methoxy PEG-Epoetin Beta (MIRCERA IJ) Mircera 09/01/19   [provider]  metoprolol succinate (TOPROL-XL) 100 MG 24 hr tablet Take 100 mg by mouth daily. Take with or immediately following a meal.    [provider]  NARCAN 4 MG/0.1ML LIQD nasal spray kit Place 1 spray into the nose as needed (accidental overdose.).  10/10/19   [provider]  Oxycodone HCl 10 MG TABS Take 10 mg by mouth 3 (three) times daily. 02/08/20   [provider]  pramipexole (MIRAPEX) 0.5 MG tablet Take 0.5 mg by mouth daily.  05/18/17   [provider]  rosuvastatin (CRESTOR) 40 MG tablet TAKE 1 TABLET BY MOUTH EVERY DAY *PATIENT NEEDS APPOINTMENT* 03/24/20   Minus Breeding, MD    Allergies    Bupropion  Review of Systems   Review of Systems  Eyes: Positive for visual disturbance.  All other systems reviewed and are negative.   Physical Exam Updated Vital Signs BP (!) 114/96   Pulse 76   Temp 97.6 F (36.4 C)   Resp 15   LMP  (LMP Unknown) Comment: LMP Feb 2011  SpO2 100%   Physical Exam Vitals and nursing note reviewed.  Constitutional:      Appearance: Normal appearance. She is obese.  HENT:     Head: Normocephalic and atraumatic.     Right Ear: External ear normal.     Left Ear: External ear normal.     Nose: Nose normal.     Mouth/Throat:     Mouth: Mucous membranes are moist.     Pharynx: Oropharynx is clear.  Eyes:     Extraocular Movements: Extraocular movements intact.     Conjunctiva/sclera: Conjunctivae normal.     Pupils: Pupils are equal, round, and reactive to light.  Cardiovascular:     Rate and Rhythm: Normal rate and regular rhythm.     Pulses: Normal pulses.     Heart sounds: Normal heart sounds.  Pulmonary:     Effort: Pulmonary effort is normal.     Breath sounds: Normal breath sounds.  Abdominal:     General: Abdomen is flat. Bowel sounds are normal.     Palpations: Abdomen is soft.  Musculoskeletal:        General: Normal range of motion.     Cervical back: Normal range of motion and neck supple.  Skin:    General: Skin is warm.     Capillary Refill: Capillary refill takes less than 2 seconds.  Neurological:     General: No focal deficit present.     Mental Status: She is alert and oriented to person, place, and time.  Psychiatric:  Mood and Affect: Mood normal.        Behavior: Behavior normal.        Thought Content: Thought content normal.        Judgment:  Judgment normal.     ED Results / Procedures / Treatments   Labs (all labs ordered are listed, but only abnormal results are displayed) Labs Reviewed  CBG MONITORING, ED - Abnormal; Notable for the following components:      Result Value   Glucose-Capillary 160 (*)    All other components within normal limits    EKG None  Radiology CT Head Wo Contrast  Result Date: 04/23/2020 CLINICAL DATA:  Abnormal mental status. EXAM: CT HEAD WITHOUT CONTRAST TECHNIQUE: Contiguous axial images were obtained from the base of the skull through the vertex without intravenous contrast. COMPARISON:  10/23/2018 FINDINGS: Brain: No acute intracranial abnormality. Specifically, no hemorrhage, hydrocephalus, mass lesion, acute infarction, or significant intracranial injury. Vascular: No hyperdense vessel or unexpected calcification. Skull: No acute calvarial abnormality. Sinuses/Orbits: No acute findings Other: None IMPRESSION: No acute intracranial abnormality. Electronically Signed   By: Rolm Baptise M.D.   On: 04/23/2020 17:16    Procedures Procedures   Medications Ordered in ED Medications - No data to display  ED Course  I have reviewed the triage vital signs and the nursing notes.  Pertinent labs & imaging results that were available during my care of the patient were reviewed by me and considered in my medical decision making (see chart for details).    MDM Rules/Calculators/A&P                          Pt is followed by Dr. Iona Hansen [facility]).  Pt's visual acuity is 20/20 on the left and 20/30 on the right.  I suspect pt may have some vitreous hemorrhages.  Pt d/w Dr. Jule Ser (on call).  The [facility] office is closed this week, but he can see pt tomorrow at 0800 at the [place].  Pt said she can get there. Pt is stable for d/c.  Return if worse.  Final Clinical Impression(s) / ED Diagnoses Final diagnoses:  Floaters, right    Rx / DC Orders ED Discharge Orders     None       Isla Pence, MD 04/23/20 [phone]

## 2020-04-23 NOTE — ED Triage Notes (Signed)
Patient arrived by [facility] following full dialysis treatment. MD sent her here because patient reports fall prior to dialysis and can see a red floater in right eye, denies eye pain and denies change to vision

## 2020-04-23 NOTE — Discharge Instructions (Addendum)
It is very important that you see the Retina Doctor on call for Dr. Iona Hansen tomorrow (Dr. Jule Ser) at 8 am!!

## 2020-04-25 ENCOUNTER — Emergency Department (HOSPITAL_COMMUNITY)
Admission: EM | Admit: 2020-04-25 | Discharge: 2020-04-25 | Disposition: A | Discharge status: Home / Self Care | Payer: Medicare Other | Attending: Emergency Medicine | Admitting: Emergency Medicine

## 2020-04-25 ENCOUNTER — Encounter (HOSPITAL_COMMUNITY): Payer: Self-pay | Admitting: Emergency Medicine

## 2020-04-25 ENCOUNTER — Other Ambulatory Visit: Payer: Self-pay

## 2020-04-25 ENCOUNTER — Encounter: Payer: Self-pay | Admitting: Endocrinology

## 2020-04-25 ENCOUNTER — Emergency Department (HOSPITAL_COMMUNITY): Payer: Medicare Other

## 2020-04-25 DIAGNOSIS — I509 Heart failure, unspecified: Secondary | ICD-10-CM | POA: Insufficient documentation

## 2020-04-25 DIAGNOSIS — Z8616 Personal history of COVID-19: Secondary | ICD-10-CM | POA: Diagnosis not present

## 2020-04-25 DIAGNOSIS — E11319 Type 2 diabetes mellitus with unspecified diabetic retinopathy without macular edema: Secondary | ICD-10-CM | POA: Insufficient documentation

## 2020-04-25 DIAGNOSIS — E1122 Type 2 diabetes mellitus with diabetic chronic kidney disease: Secondary | ICD-10-CM | POA: Diagnosis not present

## 2020-04-25 DIAGNOSIS — S46912A Strain of unspecified muscle, fascia and tendon at shoulder and upper arm level, left arm, initial encounter: Secondary | ICD-10-CM | POA: Insufficient documentation

## 2020-04-25 DIAGNOSIS — Z992 Dependence on renal dialysis: Secondary | ICD-10-CM | POA: Insufficient documentation

## 2020-04-25 DIAGNOSIS — Z87891 Personal history of nicotine dependence: Secondary | ICD-10-CM | POA: Insufficient documentation

## 2020-04-25 DIAGNOSIS — I132 Hypertensive heart and chronic kidney disease with heart failure and with stage 5 chronic kidney disease, or end stage renal disease: Secondary | ICD-10-CM | POA: Insufficient documentation

## 2020-04-25 DIAGNOSIS — N186 End stage renal disease: Secondary | ICD-10-CM | POA: Diagnosis not present

## 2020-04-25 DIAGNOSIS — S4992XA Unspecified injury of left shoulder and upper arm, initial encounter: Secondary | ICD-10-CM | POA: Diagnosis present

## 2020-04-25 DIAGNOSIS — I2510 Atherosclerotic heart disease of native coronary artery without angina pectoris: Secondary | ICD-10-CM | POA: Diagnosis not present

## 2020-04-25 DIAGNOSIS — E1140 Type 2 diabetes mellitus with diabetic neuropathy, unspecified: Secondary | ICD-10-CM | POA: Diagnosis not present

## 2020-04-25 DIAGNOSIS — W050XXA Fall from non-moving wheelchair, initial encounter: Secondary | ICD-10-CM | POA: Diagnosis not present

## 2020-04-25 DIAGNOSIS — Z794 Long term (current) use of insulin: Secondary | ICD-10-CM | POA: Insufficient documentation

## 2020-04-25 DIAGNOSIS — Z79899 Other long term (current) drug therapy: Secondary | ICD-10-CM | POA: Insufficient documentation

## 2020-04-25 MED ORDER — OXYCODONE-ACETAMINOPHEN 5-325 MG PO TABS
1.0000 | ORAL_TABLET | Freq: Once | ORAL | Status: AC
  Administered 2020-04-25: 1 via ORAL
  Filled 2020-04-25: qty 1

## 2020-04-25 MED ORDER — METHOCARBAMOL 500 MG PO TABS: 500.0000 mg | ORAL_TABLET | Freq: Three times a day (TID) | ORAL | 0 refills | Status: DC | PRN

## 2020-04-25 NOTE — Discharge Instructions (Signed)
Follow-up with orthopedics for your shoulder pain.  Take Tylenol as needed for pain control.  Can take muscle relaxer as needed as well.  This can make you slightly drowsy should not be taken while driving or operating heavy machinery.  Follow-up with your ophthalmologist as previously discussed with the other ER doctor.

## 2020-04-25 NOTE — ED Provider Notes (Signed)
[facility] EMERGENCY DEPARTMENT Provider Note   CSN: [id number] Arrival date & time: 04/25/20  1619     History Chief Complaint  Patient presents with  . Fall    [name] is a 55 y.o. female.  HPI   55 year old female history of anemia, arthritis, CAD, CHF, ESRD, hyperlipidemia, heart murmur, hypertension, MR, pericardial effusion, pneumonia, sleep apnea, aortic aneurysm, type 2 diabetes, who presents the emergency department today complaining of left shoulder pain.  Patient fell after getting out of her wheelchair 2 days ago onto her back.  She hit her head and also complains of pain to the left shoulder.  She was seen in the ED a few days ago and a CT scan of her head however her shoulder pain was not severe at that time so she did not have any imaging of it.  Since then she states that it is constant and severe in nature it is worse when she lifts her left upper extremity.  She denies any other complaints at this time.  Past Medical History:  Diagnosis Date  . Anemia   . Anxiety   . Arthritis    "knees" "hands", "RA"  . CAD (coronary artery disease)    Nonobstructive on CT 2019  . CHF (congestive heart failure) ([place])   . COVID-19 10/2018  . Dyspnea    "when I have too much fluid."  . ESRD (end stage renal disease) ([place])    Dialysis TTHSat- 3rd st  . Headache(784.0)   . Heart murmur   . High cholesterol   . History of blood transfusion   . Hypertension   . Mitral regurgitation    moderate to severe MR 10/2018 echo  . 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 Pericardial effusion   . Pneumonia    ; 11/16/2018- "touch of pneumonia" - seen in ED- 11/14/2018- on antibiotic. Has had it x 2  . PVD (peripheral vascular disease) ([place])    Right leg stent in [place].  (No records)  . Restless legs   . Sleep apnea    does not use Cpap  . Thoracic ascending aortic aneurysm (HCC)    4.4 cm 11/14/18 CTA  . Type II diabetes  mellitus [facility])     Patient Active Problem List   Diagnosis Date Noted  . Thyroid nodule 04/10/2020  . Acute respiratory failure ([place]) 02/26/2020  . Chronic pain 03/20/2019  . Preop cardiovascular exam 12/19/2018  . Dyslipidemia 11/20/2018  . Shortness of breath 10/23/2018  . Acute respiratory distress 10/23/2018  . Chronic bilateral pleural effusions 10/23/2018  . Obesity 10/23/2018  . Coronary artery disease 10/23/2018  . Tobacco abuse 10/23/2018  . Volume overload 10/23/2018  . Unspecified protein-calorie malnutrition ([place]) 10/11/2018  . Pneumonia 10/10/2018  . COVID-19 virus detected 10/10/2018  . Acute encephalopathy 10/09/2018  . End-stage renal disease on hemodialysis ([place]) 09/27/2018  . Acute respiratory failure with hypoxia ([place]) 09/26/2018  . Chronic pain disorder 09/26/2018  . Other encephalopathy 09/26/2018  . COVID-19 virus infection 09/18/2018  . Acute metabolic encephalopathy 66/07/3014  . Gangrene ([place]) 09/18/2018  . Allergy, unspecified, initial encounter 09/04/2018  . Anaphylactic shock, unspecified, initial encounter 09/04/2018  . Left arm pain 06/27/2018  . Educated about COVID-19 virus infection 06/27/2018  . Iron deficiency anemia, unspecified 01/10/2018  . Coagulation defect, unspecified ([place]) 12/06/2017  . Diarrhea, unspecified 10/11/2017  . Anemia 09/26/2017  . Left hip pain 09/26/2017  .  Neck pain 09/26/2017  . Leukocytosis 09/26/2017  . Pruritus, unspecified 09/22/2017  . Secondary hyperparathyroidism of renal origin ([place]) 09/01/2017  . Disorder of phosphorus metabolism, unspecified 08/25/2017  . Anemia in chronic kidney disease 08/24/2017  . Chest pain 07/07/2017  . Left ventricular hypertrophy 06/17/2017  . ESRD on dialysis ([place]) 05/21/2011  . Diabetic retinopathy 05/21/2011  . Diastolic dysfunction 70/35/0093  . Hyperlipidemia 01/30/2011  . Type II diabetes mellitus with complication, uncontrolled ([place]) 01/29/2011  . Neuropathy 01/29/2011   . Hypertension     Past Surgical History:  Procedure Laterality Date  . AMPUTATION Left 12/27/2018   Procedure: LEFT THUMB REVISION AMPUTATION DIGIT;  Surgeon: Charlotte Crumb, MD;  Location: [facility];  Service: Orthopedics;  Laterality: Left;  . AV FISTULA PLACEMENT Left   . AV FISTULA PLACEMENT Right 03/12/2019   Procedure: INSERTION OF ARTERIOVENOUS (AV) GORE-TEX GRAFT ARM;  Surgeon: Elam Dutch, MD;  Location: [facility] OR;  Service: Vascular;  Laterality: Right;  . AV FISTULA PLACEMENT Right 08/13/2019   Procedure: RIGHT UPPER ARM ARTERIOVENOUS (AV) FISTULA CREATION WITH BASILIC VEIN;  Surgeon: Elam Dutch, MD;  Location: [facility];  Service: Vascular;  Laterality: Right;  . [place]; 1997   x 2  . COLONOSCOPY    . EYE SURGERY Bilateral    cataract surgery  . INSERTION OF DIALYSIS CATHETER Right 09/26/2018   Procedure: INSERTION OF DIALYSIS CATHETER Right Internal Jugular.;  Surgeon: Elam Dutch, MD;  Location: [facility];  Service: Vascular;  Laterality: Right;  . IR FLUORO GUIDE CV LINE RIGHT  05/04/2019  . IR US GUIDE VASC ACCESS RIGHT  05/04/2019  . LIGATION OF ARTERIOVENOUS  FISTULA Left 09/26/2018   Procedure: LIGATION OF ARTERIOVENOUS  FISTULA LEFT ARM;  Surgeon: Elam Dutch, MD;  Location: [facility];  Service: Vascular;  Laterality: Left;  . PERICARDIAL WINDOW  02/2004   for pericardial effusion  . PERIPHERAL VASCULAR INTERVENTION Right 10/26/2019   Procedure: PERIPHERAL VASCULAR INTERVENTION;  Surgeon: Elam Dutch, MD;  Location: [place] CV LAB;  Service: Cardiovascular;  Laterality: Right;  arm  AV fistula  . TUBAL LIGATION  05/1995  . UPPER EXTREMITY VENOGRAPHY N/A 06/22/2019   Procedure: UPPER EXTREMITY VENOGRAPHY - Right Central;  Surgeon: Elam Dutch, MD;  Location: [place] CV LAB;  Service: Cardiovascular;  Laterality: N/A;     OB History   No obstetric history on file.     Family History  Problem Relation Age of Onset  . Cancer  Brother   . Heart disease Father        Died age 32  . Diabetes Father   . Hyperlipidemia Father   . Hypertension Father   . Stroke Father   . Diabetes Mother   . Hypertension Mother   . Stroke Mother   . Dementia Mother   . Diabetes Sister   . Diabetes Brother   . Hypertension Sister   . Hypertension Brother     Social History   Tobacco Use  . Smoking status: Former Smoker    Years: 33.00    Types: Cigarettes    Quit date: 07/17/2019    Years since quitting: 0.7  . Smokeless tobacco: Never Used  . Tobacco comment: using NIcotene gum, rare 1/2 cigarette  Vaping Use  . Vaping Use: Never used  Substance Use Topics  . Alcohol use: No  . Drug use: No    Home Medications Prior to Admission medications   Medication  Sig Start Date End Date Taking? Authorizing Provider  methocarbamol (ROBAXIN) 500 MG tablet Take 1 tablet (500 mg total) by mouth every 8 (eight) hours as needed for muscle spasms. 04/25/20  Yes Lucrezia Starch, MD  albuterol (VENTOLIN HFA) 108 (90 Base) MCG/ACT inhaler Inhale 2 puffs into the lungs every 6 (six) hours as needed for wheezing or shortness of breath. 02/22/20   Thurnell Lose, MD  AURYXIA 1 GM 210 MG(Fe) tablet Take 630-1,050 mg by mouth See admin instructions. Take 5 tablets (1050 mg) by mouth with each meal & take 3 tablets (630 mg) by mouth with each snack 05/29/18   [provider]  b complex-vitamin c-folic acid (NEPHRO-VITE) 0.8 MG TABS tablet Take 1 tablet by mouth every Monday, Wednesday, and Friday with hemodialysis. 04/12/19   [provider]  calcitRIOL (ROCALTROL) 0.5 MCG capsule Take 0.5 mcg by mouth as directed. Given at Dialysis    [provider]  glucose blood (ONETOUCH VERIO) test strip 1 each by Other route 2 (two) times daily. And lancets 2/day 04/10/20   Renato Shin, MD  hydrOXYzine (ATARAX/VISTARIL) 25 MG tablet Take 25 mg by mouth daily.    [provider]  Insulin Lispro Prot & Lispro  (HUMALOG 75/25 MIX) (75-25) 100 UNIT/ML Kwikpen Inject 25 Units into the skin 2 (two) times daily with a meal.    [provider]  Methoxy PEG-Epoetin Beta (MIRCERA IJ) Mircera 09/01/19   [provider]  metoprolol succinate (TOPROL-XL) 100 MG 24 hr tablet Take 100 mg by mouth daily. Take with or immediately following a meal.    [provider]  NARCAN 4 MG/0.1ML LIQD nasal spray kit Place 1 spray into the nose as needed (accidental overdose.).  10/10/19   [provider]  Oxycodone HCl 10 MG TABS Take 10 mg by mouth 3 (three) times daily. 02/08/20   [provider]  pramipexole (MIRAPEX) 0.5 MG tablet Take 0.5 mg by mouth daily.  05/18/17   [provider]  rosuvastatin (CRESTOR) 40 MG tablet TAKE 1 TABLET BY MOUTH EVERY DAY *PATIENT NEEDS APPOINTMENT* 03/24/20   Minus Breeding, MD    Allergies    Bupropion  Review of Systems   Review of Systems  Respiratory: Negative for shortness of breath.   Cardiovascular: Negative for chest pain.  Musculoskeletal:       Left shoulder pain     Physical Exam Updated Vital Signs BP (!) 125/53 (BP Location: Left Arm)   Pulse 71   Temp 98.2 F (36.8 C)   Resp 20   LMP  (LMP Unknown) Comment: LMP Feb 2011  SpO2 91%   Physical Exam Vitals and nursing note reviewed.  Constitutional:      General: She is not in acute distress.    Appearance: She is well-developed.  HENT:     Head: Normocephalic and atraumatic.  Eyes:     Conjunctiva/sclera: Conjunctivae normal.  Cardiovascular:     Rate and Rhythm: Normal rate.  Pulmonary:     Effort: Pulmonary effort is normal.  Musculoskeletal:        General: Normal range of motion.     Cervical back: Neck supple.     Comments: ttp to the left cervical paraspinous muscles and left posterior shoulder, 5/5 grip strength bilaterally and 5/5 strength to the ble, normal sensation throughout. No midline ttp.  Skin:    General: Skin is warm and dry.   Neurological:     Mental Status: She  is alert.     ED Results / Procedures / Treatments   Labs (all labs ordered are listed, but only abnormal results are displayed) Labs Reviewed - No data to display  EKG None  Radiology DG Shoulder Left  Result Date: 04/25/2020 CLINICAL DATA:  Fall. EXAM: LEFT SHOULDER - 2+ VIEW COMPARISON:  None. FINDINGS: There is no evidence of fracture or dislocation. There is no evidence of arthropathy or other focal bone abnormality. Soft tissues are unremarkable. IMPRESSION: Negative. Electronically Signed   By: Kerby Moors M.D.   On: 04/25/2020 17:06    Procedures Procedures   Medications Ordered in ED Medications  oxyCODONE-acetaminophen (PERCOCET/ROXICET) 5-325 MG per tablet 1 tablet (has no administration in time range)    ED Course  I have reviewed the triage vital signs and the nursing notes.  Pertinent labs & imaging results that were available during my care of the patient were reviewed by me and considered in my medical decision making (see chart for details).    MDM Rules/Calculators/A&P                          55 year old female presenting after mechanical fall complaining of left shoulder pain.  Has pain with range of motion and palpation on exam.  X-ray negative for fracture or dislocation.  Will DC with muscle relaxers.  Have advised close follow-up and strict return precautions.  She was given an Ortho referral.  She voices understanding plan and reasons to return.  All questions answered.  Patient stable for discharge   Final Clinical Impression(s) / ED Diagnoses Final diagnoses:  Strain of left shoulder, initial encounter    Rx / DC Orders ED Discharge Orders         Ordered    methocarbamol (ROBAXIN) 500 MG tablet  Every 8 hours PRN        04/25/20 1717           Rodney Booze, PA-C 04/25/20 1724    Lucrezia Starch, MD 04/25/20 2105

## 2020-04-25 NOTE — ED Notes (Signed)
Patient transported to X-ray 

## 2020-04-25 NOTE — ED Triage Notes (Signed)
Patient here after falling two days ago. Patient states she fell asleep in a chair without a back and then fell backwards off the chair, now complains of left shoulder pain. Patient alert, oriented, and in no apparent distress at this time.

## 2020-05-01 ENCOUNTER — Encounter: Payer: Self-pay | Admitting: Endocrinology

## 2020-05-01 ENCOUNTER — Ambulatory Visit (INDEPENDENT_AMBULATORY_CARE_PROVIDER_SITE_OTHER): Payer: Medicare Other | Admitting: Endocrinology

## 2020-05-01 ENCOUNTER — Other Ambulatory Visit: Payer: Self-pay

## 2020-05-01 VITALS — BP 138/82 | HR 81 | Ht 68.0 in

## 2020-05-01 DIAGNOSIS — E1165 Type 2 diabetes mellitus with hyperglycemia: Secondary | ICD-10-CM

## 2020-05-01 DIAGNOSIS — IMO0002 Reserved for concepts with insufficient information to code with codable children: Secondary | ICD-10-CM

## 2020-05-01 DIAGNOSIS — E118 Type 2 diabetes mellitus with unspecified complications: Secondary | ICD-10-CM | POA: Diagnosis not present

## 2020-05-01 LAB — POCT GLYCOSYLATED HEMOGLOBIN (HGB A1C): Hemoglobin A1C: 9.7 % — AB (ref 4.0–5.6)

## 2020-05-01 MED ORDER — INSULIN LISPRO PROT & LISPRO (75-25 MIX) 100 UNIT/ML KWIKPEN: 35.0000 [IU] | PEN_INJECTOR | Freq: Two times a day (BID) | SUBCUTANEOUS | 3 refills | Status: DC

## 2020-05-01 NOTE — Patient Instructions (Addendum)
Please set up an appointment for the ultrasound.  Please increase the insulin to 35 units with breakfast and with supper.   check your blood sugar twice a day.  vary the time of day when you check, between before the 3 meals, and at bedtime.  also check if you have symptoms of your blood sugar being too high or too low.  please keep a record of the readings and bring it to your next appointment here (or you can bring the meter itself).  You can write it on any piece of paper.  please call us sooner if your blood sugar goes below 70, or if most of your readings are over 200.   Please come back for a follow-up appointment in 2 months.

## 2020-05-01 NOTE — Progress Notes (Signed)
Subjective:    Patient ID: [name], female    DOB: Aug 17, 1965, 55 y.o.   MRN: [id number]  HPI Pt returns for f/u of MNG (dx'ed 2022).   Pt returns for f/u of diabetes mellitus: DM type: 2 Dx'ed: 5697 Complications: ESRD, PN, and CAD Therapy: insulin since dx GDM: never DKA: never Severe hypoglycemia: never Pancreatitis: never Pancreatic imaging: normal on 2020 CT SDOH: none Other: she takes qd insulin, at least for now Interval history: Pt says cbg's are in the 200's.  It is in general higher as the day goes on.  Pt says he never misses the insulin.  He has multiple questions for primary care provider.   Past Medical History:  Diagnosis Date  . Anemia   . Anxiety   . Arthritis    "knees" "hands", "RA"  . CAD (coronary artery disease)    Nonobstructive on CT 2019  . CHF (congestive heart failure) ([place])   . COVID-19 10/2018  . Dyspnea    "when I have too much fluid."  . ESRD (end stage renal disease) ([place])    Dialysis TTHSat- 3rd st  . Headache(784.0)   . Heart murmur   . High cholesterol   . History of blood transfusion   . Hypertension   . Mitral regurgitation    moderate to severe MR 10/2018 echo  . 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 Pericardial effusion   . Pneumonia    ; 11/16/2018- "touch of pneumonia" - seen in ED- 11/14/2018- on antibiotic. Has had it x 2  . PVD (peripheral vascular disease) ([place])    Right leg stent in [place].  (No records)  . Restless legs   . Sleep apnea    does not use Cpap  . Thoracic ascending aortic aneurysm (HCC)    4.4 cm 11/14/18 CTA  . Type II diabetes mellitus ([place])     Past Surgical History:  Procedure Laterality Date  . AMPUTATION Left 12/27/2018   Procedure: LEFT THUMB REVISION AMPUTATION DIGIT;  Surgeon: Charlotte Crumb, MD;  Location: [facility];  Service: Orthopedics;  Laterality: Left;  . AV FISTULA PLACEMENT Left   . AV FISTULA PLACEMENT Right 03/12/2019   Procedure: INSERTION  OF ARTERIOVENOUS (AV) GORE-TEX GRAFT ARM;  Surgeon: Elam Dutch, MD;  Location: [facility];  Service: Vascular;  Laterality: Right;  . AV FISTULA PLACEMENT Right 08/13/2019   Procedure: RIGHT UPPER ARM ARTERIOVENOUS (AV) FISTULA CREATION WITH BASILIC VEIN;  Surgeon: Elam Dutch, MD;  Location: [facility];  Service: Vascular;  Laterality: Right;  . [place]; 1997   x 2  . COLONOSCOPY    . EYE SURGERY Bilateral    cataract surgery  . INSERTION OF DIALYSIS CATHETER Right 09/26/2018   Procedure: INSERTION OF DIALYSIS CATHETER Right Internal Jugular.;  Surgeon: Elam Dutch, MD;  Location: [facility];  Service: Vascular;  Laterality: Right;  . IR FLUORO GUIDE CV LINE RIGHT  05/04/2019  . IR US GUIDE VASC ACCESS RIGHT  05/04/2019  . LIGATION OF ARTERIOVENOUS  FISTULA Left 09/26/2018   Procedure: LIGATION OF ARTERIOVENOUS  FISTULA LEFT ARM;  Surgeon: Elam Dutch, MD;  Location: [facility];  Service: Vascular;  Laterality: Left;  . PERICARDIAL WINDOW  02/2004   for pericardial effusion  . PERIPHERAL VASCULAR INTERVENTION Right 10/26/2019   Procedure: PERIPHERAL VASCULAR INTERVENTION;  Surgeon: Elam Dutch, MD;  Location: [facility];  Service: Cardiovascular;  Laterality: Right;  arm  AV fistula  . TUBAL LIGATION  05/1995  . UPPER EXTREMITY VENOGRAPHY N/A 06/22/2019   Procedure: UPPER EXTREMITY VENOGRAPHY - Right Central;  Surgeon: Elam Dutch, MD;  Location: [place] [facility];  Service: Cardiovascular;  Laterality: N/A;    Social History   Socioeconomic History  . Marital status: Married    Spouse name: Not on file  . Number of children: Not on file  . Years of education: Not on file  . Highest education level: Not on file  Occupational History  . Not on file  Tobacco Use  . Smoking status: Former Smoker    Years: 33.00    Types: Cigarettes    Quit date: 07/17/2019    Years since quitting: 0.7  . Smokeless tobacco: Never Used  . Tobacco comment: using  NIcotene gum, rare 1/2 cigarette  Vaping Use  . Vaping Use: Never used  Substance and Sexual Activity  . Alcohol use: No  . Drug use: No  . Sexual activity: Not Currently    Birth control/protection: Post-menopausal  Other Topics Concern  . Not on file  Social History Narrative   Lives with son.        Social Determinants of Health   Financial Resource Strain: Not on file  Food Insecurity: Not on file  Transportation Needs: Not on file  Physical Activity: Not on file  Stress: Not on file  Social Connections: Not on file  Intimate Partner Violence: Not on file    Current Outpatient Medications on File Prior to Visit  Medication Sig Dispense Refill  . albuterol (VENTOLIN HFA) 108 (90 Base) MCG/ACT inhaler Inhale 2 puffs into the lungs every 6 (six) hours as needed for wheezing or shortness of breath. 6.7 g 0  . AURYXIA 1 GM 210 MG(Fe) tablet Take 630-1,050 mg by mouth See admin instructions. Take 5 tablets (1050 mg) by mouth with each meal & take 3 tablets (630 mg) by mouth with each snack    . b complex-vitamin c-folic acid (NEPHRO-VITE) 0.8 MG TABS tablet Take 1 tablet by mouth every Monday, Wednesday, and Friday with hemodialysis.    Marland Kitchen calcitRIOL (ROCALTROL) 0.5 MCG capsule Take 0.5 mcg by mouth as directed. Given at Dialysis    . glucose blood (ONETOUCH VERIO) test strip 1 each by Other route 2 (two) times daily. And lancets 2/day 200 each 3  . hydrOXYzine (ATARAX/VISTARIL) 25 MG tablet Take 25 mg by mouth daily.    . methocarbamol (ROBAXIN) 500 MG tablet Take 1 tablet (500 mg total) by mouth every 8 (eight) hours as needed for muscle spasms. 20 tablet 0  . Methoxy PEG-Epoetin Beta (MIRCERA IJ) Mircera    . metoprolol succinate (TOPROL-XL) 100 MG 24 hr tablet Take 100 mg by mouth daily. Take with or immediately following a meal.    . NARCAN 4 MG/0.1ML LIQD nasal spray kit Place 1 spray into the nose as needed (accidental overdose.).     Marland Kitchen Oxycodone HCl 10 MG TABS Take 10 mg by  mouth 3 (three) times daily.    . pramipexole (MIRAPEX) 0.5 MG tablet Take 0.5 mg by mouth daily.   1  . rosuvastatin (CRESTOR) 40 MG tablet TAKE 1 TABLET BY MOUTH EVERY DAY *PATIENT NEEDS APPOINTMENT* 15 tablet 1  . predniSONE (DELTASONE) 10 MG tablet TAKE 4 TABLETS (40 MG TOTAL) BY MOUTH DAILY WITH BREAKFAST FOR 4 DAYS. 16 tablet 0   No current facility-administered medications on  file prior to visit.    Allergies  Allergen Reactions  . Bupropion Itching    Family History  Problem Relation Age of Onset  . Cancer Brother   . Heart disease Father        Died age 10  . Diabetes Father   . Hyperlipidemia Father   . Hypertension Father   . Stroke Father   . Diabetes Mother   . Hypertension Mother   . Stroke Mother   . Dementia Mother   . Diabetes Sister   . Diabetes Brother   . Hypertension Sister   . Hypertension Brother     BP 138/82 (BP Location: Left Arm, Patient Position: Sitting, Cuff Size: Normal)   Pulse 81   Ht 5' 8"  (1.727 m)   LMP  (LMP Unknown) Comment: LMP Feb 2011  SpO2 92%   BMI 36.34 kg/m    Review of Systems He denies hypoglycemia.      Objective:   Physical Exam VITAL SIGNS:  See vs page.   GENERAL: no distress.  Pulses: dorsalis pedis absent bilat (poss due to thickened skin).   MSK: no deformity of the feet.   CV: trace bilat leg edema.   Skin:  Multiple healed ulcers on the feet, and the skin is thickened.  normal color and temp on the feet. Neuro: sensation is intact to touch on the feet, but decreased from normal.   Lab Results  Component Value Date   TSH 2.08 04/10/2020       Assessment & Plan:  thyroid nodule: [place] needed Insulin-requiring type 2 DM: uncontrolled   Patient Instructions  Please set up an appointment for the ultrasound.  Please increase the insulin to 35 units with breakfast and with supper.   check your blood sugar twice a day.  vary the time of day when you check, between before the 3 meals, and at bedtime.  also  check if you have symptoms of your blood sugar being too high or too low.  please keep a record of the readings and bring it to your next appointment here (or you can bring the meter itself).  You can write it on any piece of paper.  please call us sooner if your blood sugar goes below 70, or if most of your readings are over 200.   Please come back for a follow-up appointment in 2 months.

## 2020-05-03 ENCOUNTER — Other Ambulatory Visit: Payer: Self-pay

## 2020-05-03 DIAGNOSIS — N186 End stage renal disease: Secondary | ICD-10-CM

## 2020-05-13 ENCOUNTER — Telehealth: Payer: Self-pay

## 2020-05-13 ENCOUNTER — Institutional Professional Consult (permissible substitution): Payer: Medicare Other | Admitting: Neurology

## 2020-05-13 NOTE — Telephone Encounter (Signed)
PT arrived for 900 am appt at 930. Pt was unable to be see. Pt has arrived late for sleep consult twice and 1 no show. Per Dr. Rexene Alberts pt not to be reschedule and dismissed from the practice.  Thanks.

## 2020-05-14 ENCOUNTER — Encounter: Payer: Self-pay | Admitting: Neurology

## 2020-05-15 ENCOUNTER — Ambulatory Visit
Admission: RE | Admit: 2020-05-15 | Discharge: 2020-05-15 | Disposition: A | Discharge status: Home / Self Care | Payer: Medicare Other | Source: Ambulatory Visit | Attending: Endocrinology | Admitting: Endocrinology

## 2020-05-15 ENCOUNTER — Other Ambulatory Visit: Payer: Medicare Other

## 2020-05-15 ENCOUNTER — Ambulatory Visit: Payer: Medicare Other

## 2020-05-15 ENCOUNTER — Ambulatory Visit (HOSPITAL_COMMUNITY): Payer: Medicare Other

## 2020-05-15 ENCOUNTER — Institutional Professional Consult (permissible substitution): Payer: Medicare Other | Admitting: Neurology

## 2020-05-15 DIAGNOSIS — E041 Nontoxic single thyroid nodule: Secondary | ICD-10-CM

## 2020-05-29 ENCOUNTER — Other Ambulatory Visit: Payer: Self-pay | Admitting: Cardiology

## 2020-06-06 ENCOUNTER — Other Ambulatory Visit: Payer: Self-pay | Admitting: Cardiology

## 2020-06-26 ENCOUNTER — Other Ambulatory Visit: Payer: Self-pay | Admitting: Cardiology

## 2020-07-01 ENCOUNTER — Ambulatory Visit: Payer: Medicare Other | Admitting: Endocrinology

## 2020-07-21 ENCOUNTER — Other Ambulatory Visit: Payer: Self-pay | Admitting: Cardiology

## 2020-07-31 ENCOUNTER — Other Ambulatory Visit: Payer: Self-pay | Admitting: Cardiology

## 2020-07-31 NOTE — Telephone Encounter (Signed)
Rx(s) sent to pharmacy electronically.  

## 2020-08-21 ENCOUNTER — Other Ambulatory Visit: Payer: Self-pay | Admitting: Cardiology

## 2020-08-24 IMAGING — DX DG LUMBAR SPINE COMPLETE 4+V
5 series · 5 of 5 positions shown · non-contrast
Comparison: CT 10/13/2018

CLINICAL DATA: Twisted back as was going in her car yesterday ,felt
sharp pain lat RTT side down to rt hip,,still hurting today can/t
move very well

EXAM:
LUMBAR SPINE - COMPLETE 4+ VIEW

[t lumbar spine ap]
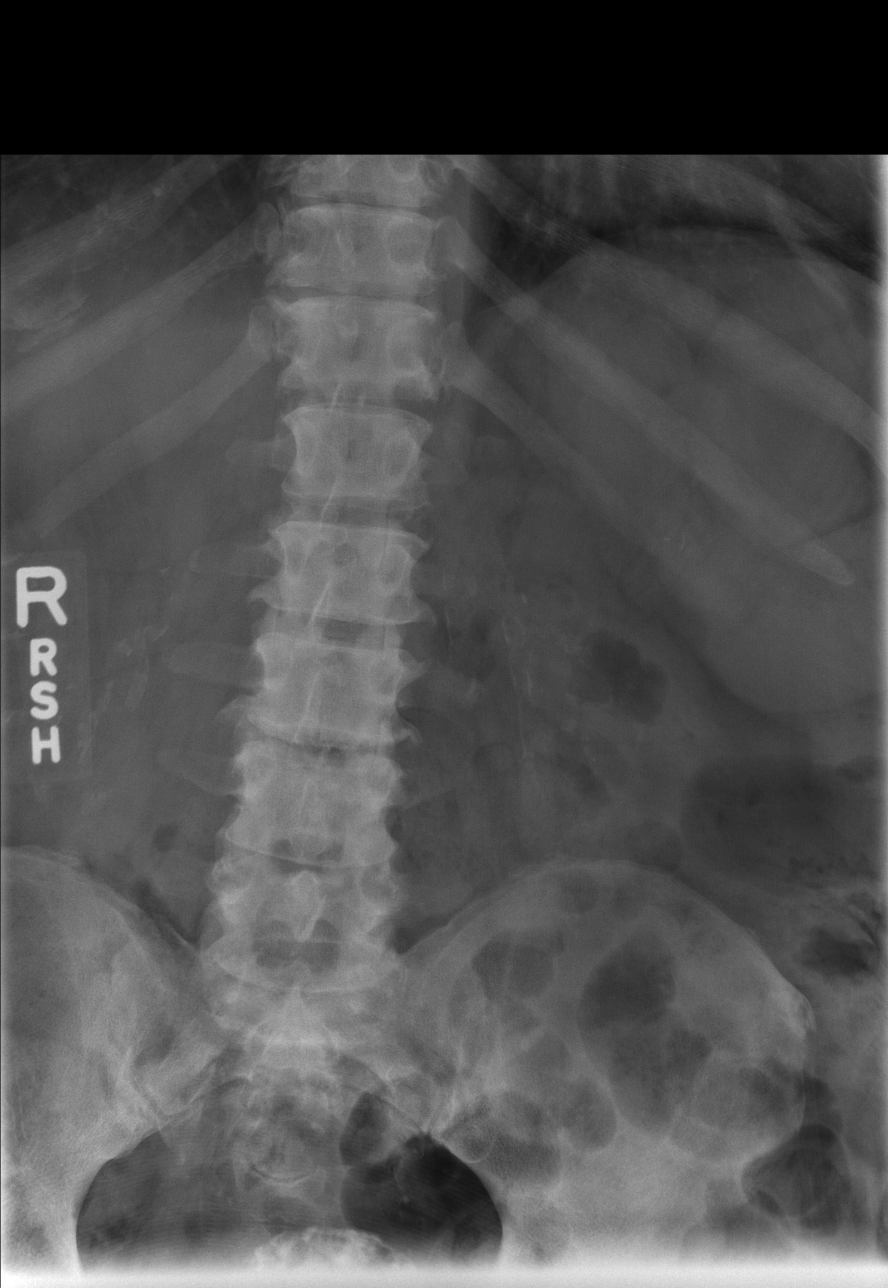

[t lumbar spine obl (1 of 2)]
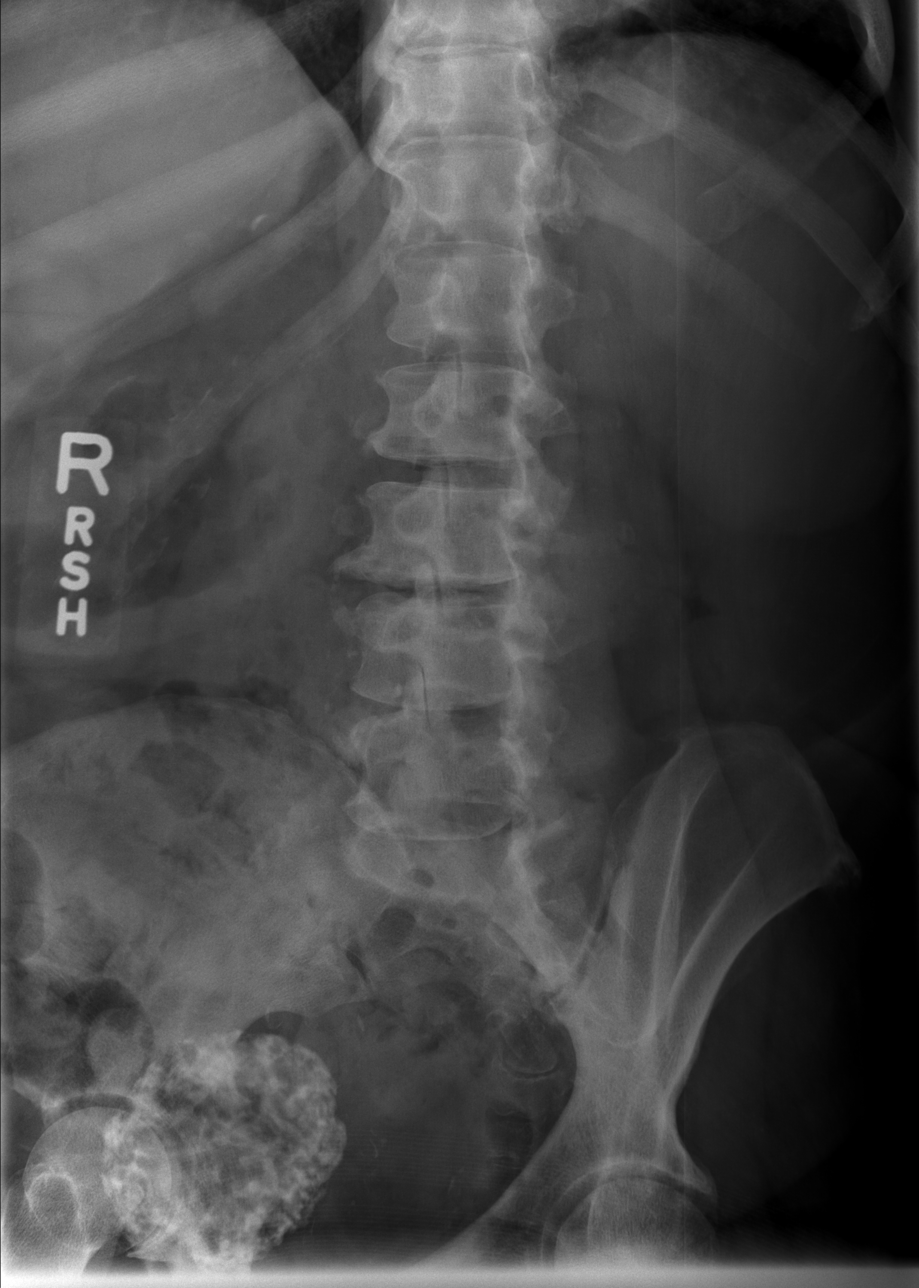

[t lumbar spine obl (2 of 2)]
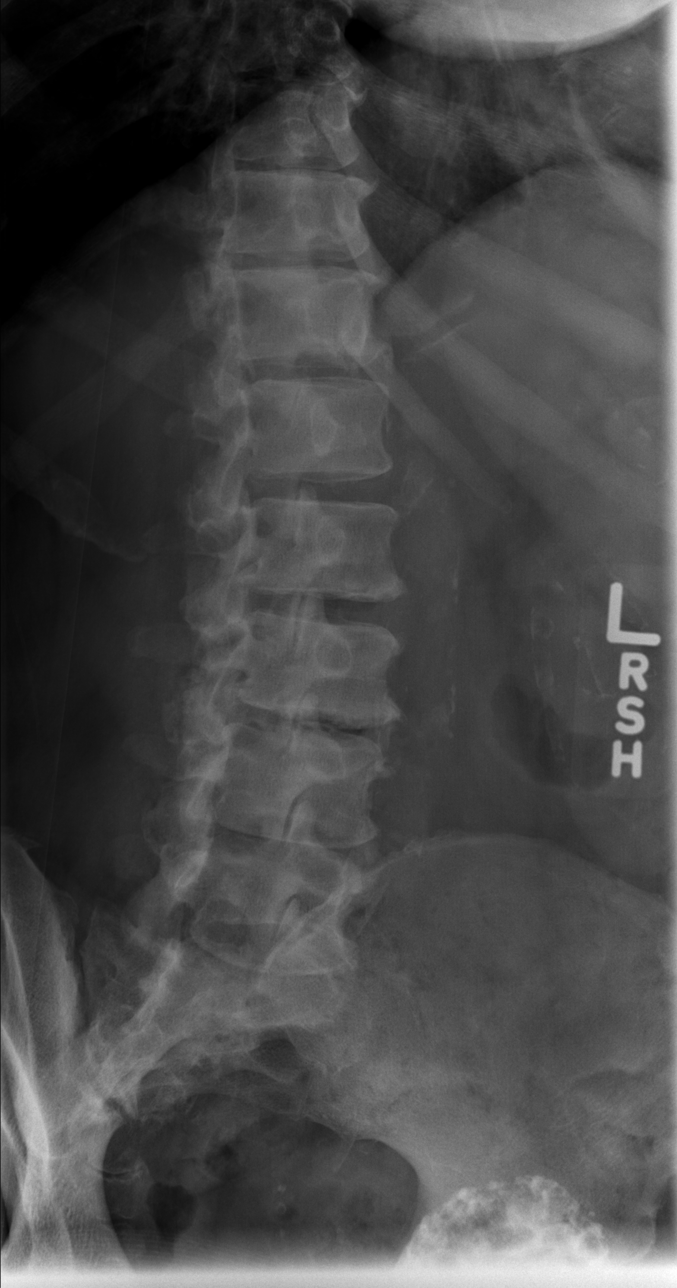

[t lumbar spine lat]
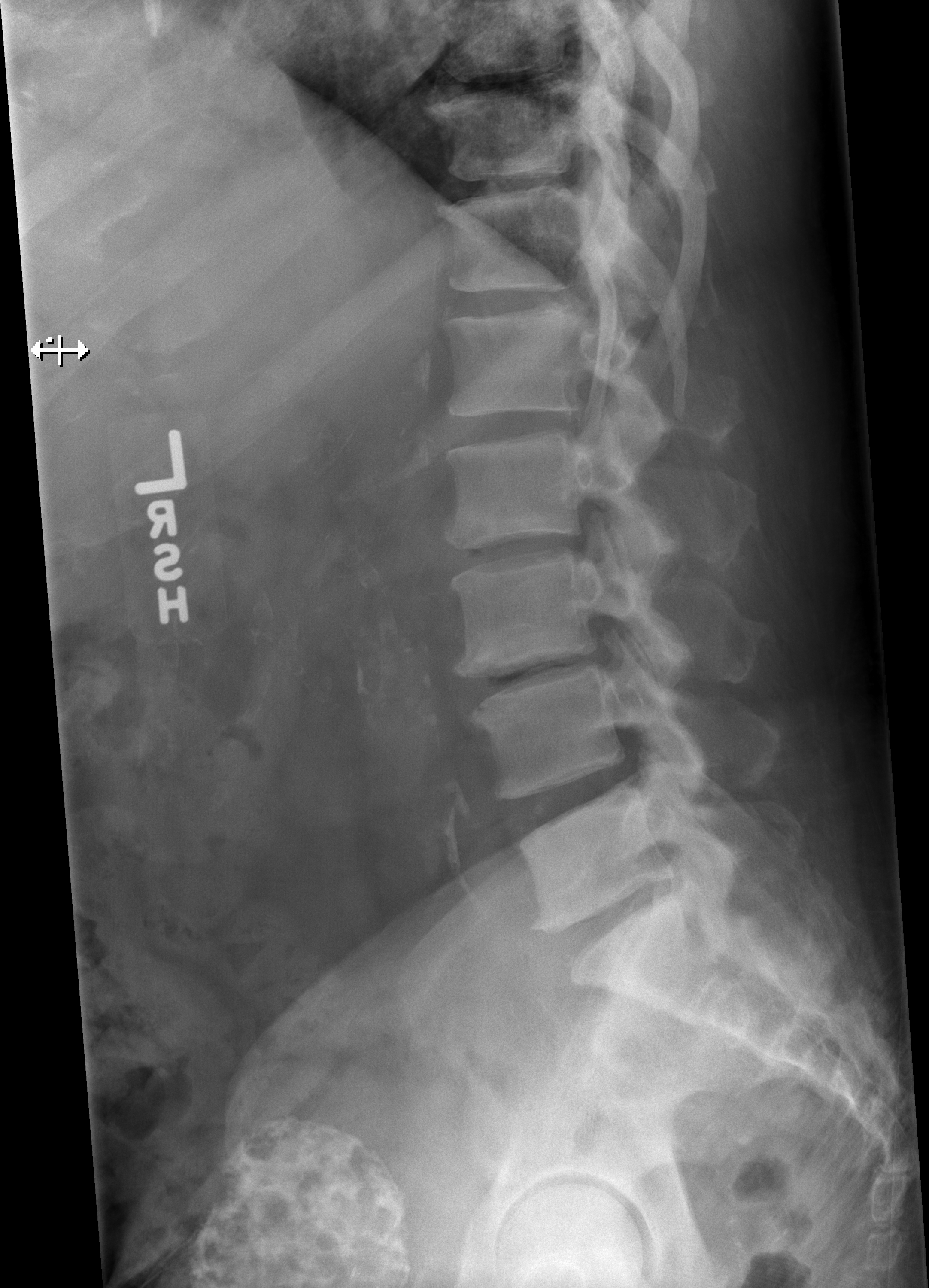

[t lumbar l-5 s-1 spot]
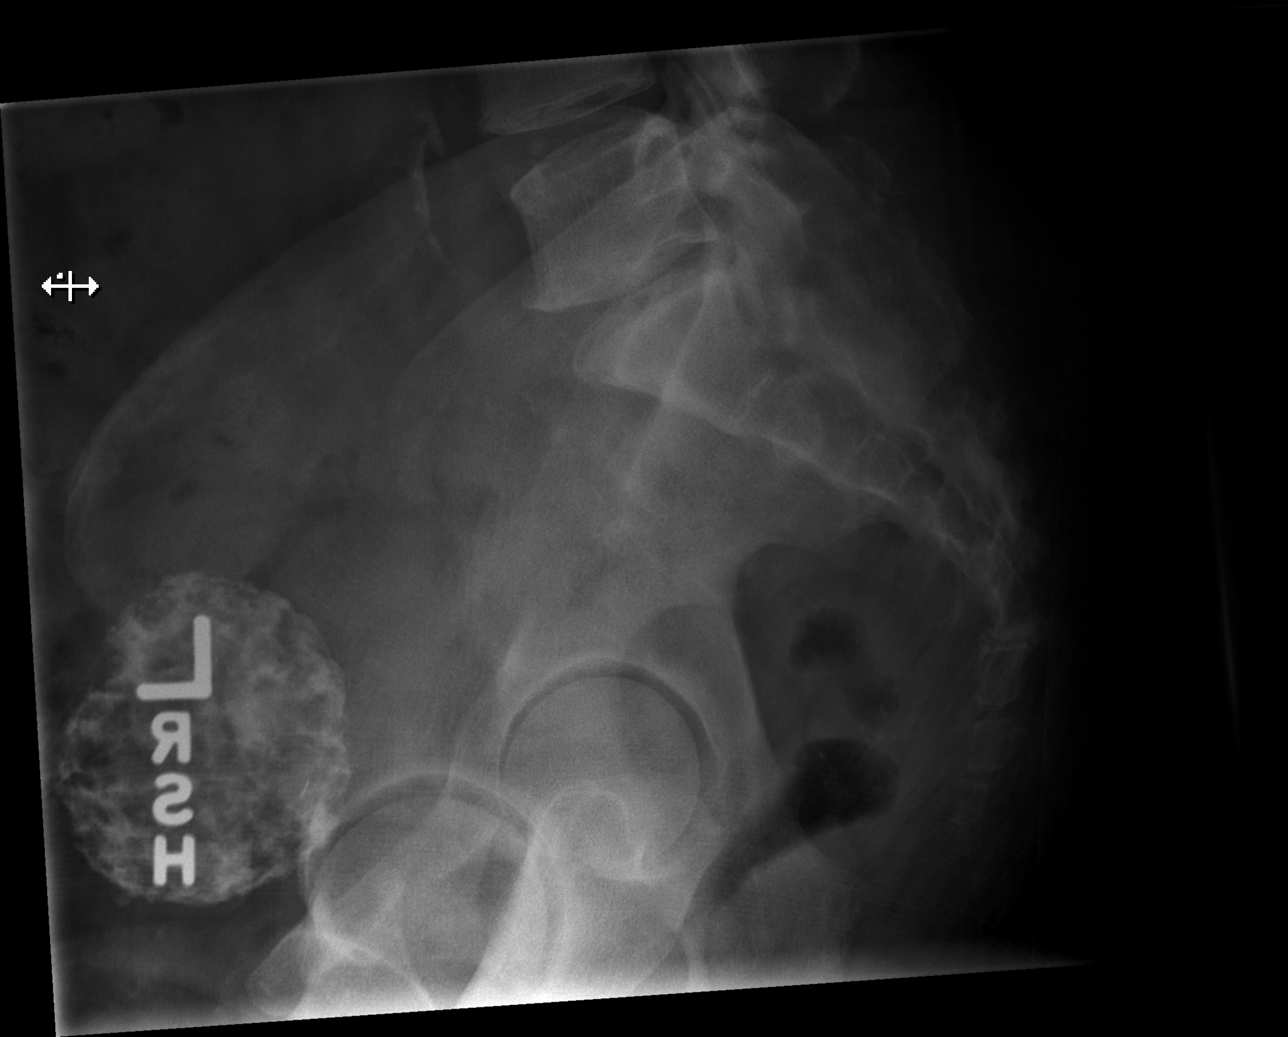

[5 of 5 positions shown; findings below may reference images not displayed]

FINDINGS: negative for fracture. Normal alignment. Multilevel spondylitic
change with narrowing of interspaces most marked L3-4. Aortic
calcifications without suggestion of aneurysm. Calcified uterine
fibroid.
IMPRESSION: Negative for fracture or other acute bone abnormality.

## 2020-09-02 ENCOUNTER — Other Ambulatory Visit: Payer: Self-pay

## 2020-09-02 ENCOUNTER — Ambulatory Visit (INDEPENDENT_AMBULATORY_CARE_PROVIDER_SITE_OTHER): Payer: Medicare Other | Admitting: Pulmonary Disease

## 2020-09-02 ENCOUNTER — Encounter: Payer: Self-pay | Admitting: Pulmonary Disease

## 2020-09-02 VITALS — BP 130/72 | HR 76 | Temp 97.8°F | Ht 68.0 in | Wt 250.0 lb

## 2020-09-02 DIAGNOSIS — R0683 Snoring: Secondary | ICD-10-CM | POA: Diagnosis not present

## 2020-09-02 NOTE — Patient Instructions (Signed)
Schedule split-night study for moderate probability of obstructive sleep apnea  Try and consolidate your sleep to nighttime  Avoid daytime napping -Napping during the day however little lit is will affect how much sleep you are able to get at night  Increase daytime activity as tolerated  Weight loss efforts  I will see you in 6 to 8 weeks  Call with significant concernsSleep Apnea Sleep apnea affects breathing during sleep. It causes breathing to stop for 10 seconds or more, or to become shallow. People with sleep apnea usually snoreloudly. It can also increase the risk of: Heart attack. Stroke. Being very overweight (obese). Diabetes. Heart failure. Irregular heartbeat. High blood pressure. The goal of treatment is to help you breathe normally again. What are the causes?  The most common cause of this condition is a collapsed or blocked airway. There are three kinds of sleep apnea: Obstructive sleep apnea. This is caused by a blocked or collapsed airway. Central sleep apnea. This happens when the brain does not send the right signals to the muscles that control breathing. Mixed sleep apnea. This is a combination of obstructive and central sleep apnea. What increases the risk? Being overweight. Smoking. Having a small airway. Being older. Being female. Drinking alcohol. Taking medicines to calm yourself (sedatives or tranquilizers). Having family members with the condition. Having a tongue or tonsils that are larger than normal. What are the signs or symptoms? Trouble staying asleep. Loud snoring. Headaches in the morning. Waking up gasping. Dry mouth or sore throat in the morning. Being sleepy or tired during the day. If you are sleepy or tired during the day, you may also: Not be able to focus your mind (concentrate). Forget things. Get angry a lot and have mood swings. Feel sad (depressed). Have changes in your personality. Have less interest in sex, if you are  female. Be unable to have an erection, if you are female. How is this treated?  Sleeping on your side. Using a medicine to get rid of mucus in your nose (decongestant). Avoiding the use of alcohol, medicines to help you relax, or certain pain medicines (narcotics). Losing weight, if needed. Changing your diet. Quitting smoking. Using a machine to open your airway while you sleep, such as: An oral appliance. This is a mouthpiece that shifts your lower jaw forward. A CPAP device. This device blows air through a mask when you breathe out (exhale). An EPAP device. This has valves that you put in each nostril. A BPAP device. This device blows air through a mask when you breathe in (inhale) and breathe out. Having surgery if other treatments do not work. Follow these instructions at home: Lifestyle Make changes that your doctor recommends. Eat a healthy diet. Lose weight if needed. Avoid alcohol, medicines to help you relax, and some pain medicines. Do not smoke or use any products that contain nicotine or tobacco. If you need help quitting, ask your doctor. General instructions Take over-the-counter and prescription medicines only as told by your doctor. If you were given a machine to use while you sleep, use it only as told by your doctor. If you are having surgery, make sure to tell your doctor you have sleep apnea. You may need to bring your device with you. Keep all follow-up visits. Contact a doctor if: The machine that you were given to use during sleep bothers you or does not seem to be working. You do not get better. You get worse. Get help right away if: Your  chest hurts. You have trouble breathing in enough air. You have an uncomfortable feeling in your back, arms, or stomach. You have trouble talking. One side of your body feels weak. A part of your face is hanging down. These symptoms may be an emergency. Get help right away. Call your local emergency services (911 in the  [place] Do not wait to see if the symptoms will go away. Do not drive yourself to the hospital. Summary This condition affects breathing during sleep. The most common cause is a collapsed or blocked airway. The goal of treatment is to help you breathe normally while you sleep. This information is not intended to replace advice given to you by your health care provider. Make sure you discuss any questions you have with your healthcare provider. Document Revised: 12/28/2019 Document Reviewed: 12/28/2019 Elsevier Patient Education  2022 [employer].

## 2020-09-02 NOTE — Progress Notes (Signed)
[name]    [id number]    [birth year]  Primary Care Physician:Sun, Mikeal Hawthorne, MD  Referring Physician: Sandi Mariscal, MD [place],  [place] [address]  Chief complaint:   Patient with excessive daytime sleepiness, concern for obstructive sleep apnea  HPI:  History of chronic kidney disease on dialysis  Takes a lot of naps during the day Usually not able to sleep at night She is on a phone, watching TV at night -Sleep hygiene does appear to be very poor  On dialysis 3 days a week Usually very tired on dialysis days  She does have chronic back pain that also limits her activity levels  Weight is about the same  Admits to snoring Not aware of apneas  Admits to dryness of the mouth History of hypertension, cardiac dysrhythmia, diabetes  Half pack a day smoker  Outpatient Encounter Medications as of 09/02/2020  Medication Sig   albuterol (VENTOLIN HFA) 108 (90 Base) MCG/ACT inhaler Inhale 2 puffs into the lungs every 6 (six) hours as needed for wheezing or shortness of breath.   AURYXIA 1 GM 210 MG(Fe) tablet Take 630-1,050 mg by mouth See admin instructions. Take 5 tablets (1050 mg) by mouth with each meal & take 3 tablets (630 mg) by mouth with each snack   b complex-vitamin c-folic acid (NEPHRO-VITE) 0.8 MG TABS tablet Take 1 tablet by mouth every Monday, Wednesday, and Friday with hemodialysis.   calcitRIOL (ROCALTROL) 0.5 MCG capsule Take 0.5 mcg by mouth as directed. Given at Dialysis   glucose blood (ONETOUCH VERIO) test strip 1 each by Other route 2 (two) times daily. And lancets 2/day   HUMIRA PEN 40 MG/0.4ML PNKT SMARTSIG:40 Milligram(s) SUB-Q Every 2 Weeks   hydrOXYzine (ATARAX/VISTARIL) 25 MG tablet Take 25 mg by mouth daily.   Insulin Lispro Prot & Lispro (HUMALOG 75/25 MIX) (75-25) 100 UNIT/ML Kwikpen Inject 35 Units into the skin 2 (two) times daily with a meal.   methocarbamol (ROBAXIN) 500 MG tablet Take 1 tablet (500 mg total) by mouth  every 8 (eight) hours as needed for muscle spasms.   Methoxy PEG-Epoetin Beta (MIRCERA IJ) Mircera   metoprolol succinate (TOPROL-XL) 100 MG 24 hr tablet Take 100 mg by mouth daily. Take with or immediately following a meal.   NARCAN 4 MG/0.1ML LIQD nasal spray kit Place 1 spray into the nose as needed (accidental overdose.).    Oxycodone HCl 10 MG TABS Take 10 mg by mouth 3 (three) times daily.   pramipexole (MIRAPEX) 0.5 MG tablet Take 0.5 mg by mouth daily.    predniSONE (DELTASONE) 10 MG tablet TAKE 4 TABLETS (40 MG TOTAL) BY MOUTH DAILY WITH BREAKFAST FOR 4 DAYS.   rosuvastatin (CRESTOR) 40 MG tablet TAKE 1 TABLET BY MOUTH DAILY *PATIENT NEEDS APPOINTMENT*   No facility-administered encounter medications on file as of 09/02/2020.    Allergies as of 09/02/2020 - Review Complete 09/02/2020  Allergen Reaction Noted   Bupropion Itching 08/28/2015    Past Medical History:  Diagnosis Date   Anemia    Anxiety    Arthritis    "knees" "hands", "RA"   CAD (coronary artery disease)    Nonobstructive on CT 2019   CHF (congestive heart failure) ([place])    COVID-19 10/2018   Dyspnea    "when I have too much fluid."   ESRD (end stage renal disease) ([place])    Dialysis TTHSat- 3rd st   Headache(784.0)  Heart murmur    High cholesterol    History of blood transfusion    Hypertension    Mitral regurgitation    moderate to severe MR 10/2018 echo   Nerve pain    "they say I have L5 nerve damage; my lumbar"   Pericardial effusion    Pneumonia    ; 11/16/2018- "touch of pneumonia" - seen in ED- 11/14/2018- on antibiotic. Has had it x 2   PVD (peripheral vascular disease) ([place])    Right leg stent in [place].  (No records)   Restless legs    Sleep apnea    does not use Cpap   Thoracic ascending aortic aneurysm (HCC)    4.4 cm 11/14/18 CTA   Type II diabetes mellitus [facility])     Past Surgical History:  Procedure Laterality Date   AMPUTATION Left 12/27/2018   Procedure: LEFT THUMB  REVISION AMPUTATION DIGIT;  Surgeon: Charlotte Crumb, MD;  Location: [facility];  Service: Orthopedics;  Laterality: Left;   AV FISTULA PLACEMENT Left    AV FISTULA PLACEMENT Right 03/12/2019   Procedure: INSERTION OF ARTERIOVENOUS (AV) GORE-TEX GRAFT ARM;  Surgeon: Elam Dutch, MD;  Location: [facility];  Service: Vascular;  Laterality: Right;   AV FISTULA PLACEMENT Right 08/13/2019   Procedure: RIGHT UPPER ARM ARTERIOVENOUS (AV) FISTULA CREATION WITH BASILIC VEIN;  Surgeon: Elam Dutch, MD;  Location: [facility];  Service: Vascular;  Laterality: Right;   [place]; 1997   x 2   COLONOSCOPY     EYE SURGERY Bilateral    cataract surgery   INSERTION OF DIALYSIS CATHETER Right 09/26/2018   Procedure: INSERTION OF DIALYSIS CATHETER Right Internal Jugular.;  Surgeon: Elam Dutch, MD;  Location: [facility];  Service: Vascular;  Laterality: Right;   IR FLUORO GUIDE CV LINE RIGHT  05/04/2019   IR US GUIDE VASC ACCESS RIGHT  05/04/2019   LIGATION OF ARTERIOVENOUS  FISTULA Left 09/26/2018   Procedure: LIGATION OF ARTERIOVENOUS  FISTULA LEFT ARM;  Surgeon: Elam Dutch, MD;  Location: [facility];  Service: Vascular;  Laterality: Left;   PERICARDIAL WINDOW  02/2004   for pericardial effusion   PERIPHERAL VASCULAR INTERVENTION Right 10/26/2019   Procedure: PERIPHERAL VASCULAR INTERVENTION;  Surgeon: Elam Dutch, MD;  Location: [facility];  Service: Cardiovascular;  Laterality: Right;  arm  AV fistula   TUBAL LIGATION  05/1995   UPPER EXTREMITY VENOGRAPHY N/A 06/22/2019   Procedure: UPPER EXTREMITY VENOGRAPHY - Right Central;  Surgeon: Elam Dutch, MD;  Location: [facility];  Service: Cardiovascular;  Laterality: N/A;    Family History  Problem Relation Age of Onset   Cancer Brother    Heart disease Father        Died age 39   Diabetes Father    Hyperlipidemia Father    Hypertension Father    Stroke Father    Diabetes Mother    Hypertension Mother    Stroke Mother     Dementia Mother    Diabetes Sister    Diabetes Brother    Hypertension Sister    Hypertension Brother     Social History   Socioeconomic History   Marital status: Married    Spouse name: Not on file   Number of children: Not on file   Years of education: Not on file   Highest education level: Not on file  Occupational History   Not on file  Tobacco Use  Smoking status: Former    Years: 33.00    Types: Cigarettes    Quit date: 07/17/2019    Years since quitting: 1.1   Smokeless tobacco: Never   Tobacco comments:    using NIcotene gum, rare 1/2 cigarette  Vaping Use   Vaping Use: Never used  Substance and Sexual Activity   Alcohol use: No   Drug use: No   Sexual activity: Not Currently    Birth control/protection: Post-menopausal  Other Topics Concern   Not on file  Social History Narrative   Lives with son.        Social Determinants of Health   Financial Resource Strain: Not on file  Food Insecurity: Not on file  Transportation Needs: Not on file  Physical Activity: Not on file  Stress: Not on file  Social Connections: Not on file  Intimate Partner Violence: Not on file    Review of Systems  Constitutional:  Positive for fatigue.  Psychiatric/Behavioral:  Positive for sleep disturbance.    Vitals:   09/02/20 1027  BP: 130/72  Pulse: 76  Temp: 97.8 F (36.6 C)  SpO2: 95%     Physical Exam Constitutional:      Appearance: She is obese.  HENT:     Head: Normocephalic.     Nose: No congestion.     Mouth/Throat:     Mouth: Mucous membranes are moist.     Comments: Mallampati 3, crowded oropharynx Eyes:     Pupils: Pupils are equal, round, and reactive to light.  Cardiovascular:     Rate and Rhythm: Normal rate and regular rhythm.     Heart sounds: No murmur heard.   No friction rub.  Pulmonary:     Effort: No respiratory distress.     Breath sounds: No stridor. No wheezing or rhonchi.  Musculoskeletal:     Cervical back: No rigidity or  tenderness.  Neurological:     Mental Status: She is alert.  Psychiatric:        Mood and Affect: Mood normal.   No flowsheet data found. Epworth Sleepiness Scale of 18   Assessment:  Moderate quality of significant obstructive sleep apnea  Excessive daytime sleepiness  End-stage renal disease on dialysis  Poor sleep hygiene  Pathophysiology of sleep disordered breathing reviewed with the patient Treatment options reviewed with the patient  Plan/Recommendations: Schedule patient for an in lab split-night study  Optimize sleep hygiene  Avoid daytime naps  Try and consolidate nighttime sleep  Graded exercises as tolerated  Tentative follow-up in 6 to 8 weeks  I spent 45 minutes dedicated to the care of this patient on the date of this encounter to include previsit review of records, face-to-face time with the patient discussing conditions above, post visit ordering of testing, clinical documentation with electronic health record and communicated necessary findings to members of the patient's care team   Sherrilyn Rist MD [facility] 09/02/2020, 10:49 AM  CC: Sandi Mariscal, MD

## 2020-09-04 ENCOUNTER — Ambulatory Visit (INDEPENDENT_AMBULATORY_CARE_PROVIDER_SITE_OTHER): Payer: Medicare Other | Admitting: Family Medicine

## 2020-09-04 DIAGNOSIS — R0602 Shortness of breath: Secondary | ICD-10-CM

## 2020-09-04 DIAGNOSIS — Z1331 Encounter for screening for depression: Secondary | ICD-10-CM

## 2020-09-04 DIAGNOSIS — R5383 Other fatigue: Secondary | ICD-10-CM

## 2020-09-18 ENCOUNTER — Ambulatory Visit (INDEPENDENT_AMBULATORY_CARE_PROVIDER_SITE_OTHER): Payer: Medicare Other | Admitting: Family Medicine

## 2020-10-01 ENCOUNTER — Emergency Department (HOSPITAL_COMMUNITY)
Admission: EM | Admit: 2020-10-01 | Discharge: 2020-10-01 | Disposition: A | Discharge status: Home / Self Care | Payer: Medicare Other | Attending: Emergency Medicine | Admitting: Emergency Medicine

## 2020-10-01 ENCOUNTER — Encounter (HOSPITAL_COMMUNITY): Payer: Self-pay

## 2020-10-01 ENCOUNTER — Other Ambulatory Visit: Payer: Self-pay

## 2020-10-01 DIAGNOSIS — Z87891 Personal history of nicotine dependence: Secondary | ICD-10-CM | POA: Insufficient documentation

## 2020-10-01 DIAGNOSIS — H9191 Unspecified hearing loss, right ear: Secondary | ICD-10-CM

## 2020-10-01 DIAGNOSIS — N186 End stage renal disease: Secondary | ICD-10-CM | POA: Insufficient documentation

## 2020-10-01 DIAGNOSIS — I2510 Atherosclerotic heart disease of native coronary artery without angina pectoris: Secondary | ICD-10-CM | POA: Insufficient documentation

## 2020-10-01 DIAGNOSIS — Z79899 Other long term (current) drug therapy: Secondary | ICD-10-CM | POA: Insufficient documentation

## 2020-10-01 DIAGNOSIS — I132 Hypertensive heart and chronic kidney disease with heart failure and with stage 5 chronic kidney disease, or end stage renal disease: Secondary | ICD-10-CM | POA: Diagnosis not present

## 2020-10-01 DIAGNOSIS — Z8616 Personal history of COVID-19: Secondary | ICD-10-CM | POA: Insufficient documentation

## 2020-10-01 DIAGNOSIS — I509 Heart failure, unspecified: Secondary | ICD-10-CM | POA: Diagnosis not present

## 2020-10-01 DIAGNOSIS — Z794 Long term (current) use of insulin: Secondary | ICD-10-CM | POA: Insufficient documentation

## 2020-10-01 DIAGNOSIS — H6121 Impacted cerumen, right ear: Secondary | ICD-10-CM | POA: Diagnosis not present

## 2020-10-01 DIAGNOSIS — E1122 Type 2 diabetes mellitus with diabetic chronic kidney disease: Secondary | ICD-10-CM | POA: Diagnosis not present

## 2020-10-01 DIAGNOSIS — H9391 Unspecified disorder of right ear: Secondary | ICD-10-CM | POA: Diagnosis present

## 2020-10-01 DIAGNOSIS — Z992 Dependence on renal dialysis: Secondary | ICD-10-CM | POA: Insufficient documentation

## 2020-10-01 MED ORDER — CARBAMIDE PEROXIDE 6.5 % OT SOLN: 5.0000 [drp] | Freq: Two times a day (BID) | OTIC | 0 refills | Status: DC

## 2020-10-01 NOTE — ED Triage Notes (Signed)
Pt reports she thinks something is in her ear, lost hearing out of that ear on Wednesday, Denies pain.

## 2020-10-01 NOTE — Discharge Instructions (Addendum)
Please apply 5 drops of ear drops to both ears twice daily to help dissolve ear wax.  If after several days of use and you notice no improvement, call and follow up with ENT specialist for further care.  Return if you have any concerns. Avoid using Q-tip as it may worsen your symptoms.

## 2020-10-01 NOTE — ED Provider Notes (Signed)
Emergency Medicine Provider Triage Evaluation Note  VASTI YAGI , a 55 y.o. female  was evaluated in triage.  Pt complains of right ear loss of hearing. She thinks she may have gotten something in it. No discharge. Some dizziness.  Review of Systems  Positive: Hearing loss Negative: Ear pain, discharge  Physical Exam  BP (!) 161/71 (BP Location: Left Arm)   Pulse 81   Temp 98.3 F (36.8 C) (Oral)   Resp 18   LMP  (LMP Unknown) Comment: LMP Feb 2011  SpO2 92%  Gen:   Awake, no distress   Resp:  Normal effort  MSK:   Moves extremities without difficulty  Other:  Difficulty hearing out of right ear  Medical Decision Making  Medically screening exam initiated at 3:01 PM.  Appropriate orders placed.  ILEANA CHALUPA was informed that the remainder of the evaluation will be completed by another provider, this initial triage assessment does not replace that evaluation, and the importance of remaining in the ED until their evaluation is complete.  Hearing loss   Dorien Chihuahua 10/01/20 1502    Daleen Bo, MD 10/02/20 [phone]

## 2020-10-01 NOTE — ED Provider Notes (Signed)
[facility] EMERGENCY DEPARTMENT Provider Note   CSN: [id number] Arrival date & time: 10/01/20  1400     History Chief Complaint  Patient presents with   Ear Fullness    [name] is a 55 y.o. female.  The history is provided by the patient and medical records. No language interpreter was used.  Ear Fullness   54 year old female significant history of anxiety, CAD, CHF, end-stage renal disease, hypercholesterolemia, diabetes, AAA who presents complaining of hearing loss.  Patient reports she noticed that she cannot hear out of her right ear 1 week ago.  Happen acutely.  No ringing in the ear, no ear pain no headache and no confusion.  No specific treatment tried.  She does use Q-tips to clean the ear.  No change medication.  Past Medical History:  Diagnosis Date   Anemia    Anxiety    Arthritis    "knees" "hands", "RA"   CAD (coronary artery disease)    Nonobstructive on CT 2019   CHF (congestive heart failure) ([place])    COVID-19 10/2018   Dyspnea    "when I have too much fluid."   ESRD (end stage renal disease) ([place])    Dialysis [facility]- [address]   Headache(784.0)    Heart murmur    High cholesterol    History of blood transfusion    Hypertension    Mitral regurgitation    moderate to severe MR 10/2018 echo   Nerve pain    "they say I have L5 nerve damage; my lumbar"   Pericardial effusion    Pneumonia    ; 11/16/2018- "touch of pneumonia" - seen in ED- 11/14/2018- on antibiotic. Has had it x 2   PVD (peripheral vascular disease) ([place])    Right leg stent in [place].  (No records)   Restless legs    Sleep apnea    does not use Cpap   Thoracic ascending aortic aneurysm (HCC)    4.4 cm 11/14/18 CTA   Type II diabetes mellitus [facility])     Patient Active Problem List   Diagnosis Date Noted   Thyroid nodule 04/10/2020   Acute respiratory failure ([place]) 02/26/2020   Chronic pain 03/20/2019   Preop cardiovascular exam 12/19/2018    Dyslipidemia 11/20/2018   Shortness of breath 10/23/2018   Acute respiratory distress 10/23/2018   Chronic bilateral pleural effusions 10/23/2018   Obesity 10/23/2018   Coronary artery disease 10/23/2018   Tobacco abuse 10/23/2018   Volume overload 10/23/2018   Unspecified protein-calorie malnutrition ([place]) 10/11/2018   Pneumonia 10/10/2018   COVID-19 virus detected 10/10/2018   Acute encephalopathy 10/09/2018   End-stage renal disease on hemodialysis ([place]) 09/27/2018   Acute respiratory failure with hypoxia (HCC) 09/26/2018   Chronic pain disorder 09/26/2018   Other encephalopathy 09/26/2018   COVID-19 virus infection 53/29/9242   Acute metabolic encephalopathy 68/34/1962   Gangrene ([place]) 09/18/2018   Allergy, unspecified, initial encounter 09/04/2018   Anaphylactic shock, unspecified, initial encounter 09/04/2018   Left arm pain 06/27/2018   Educated about COVID-19 virus infection 06/27/2018   Iron deficiency anemia, unspecified 01/10/2018   Coagulation defect, unspecified ([place]) 12/06/2017   Diarrhea, unspecified 10/11/2017   Anemia 09/26/2017   Left hip pain 09/26/2017   Neck pain 09/26/2017   Leukocytosis 09/26/2017   Pruritus, unspecified 09/22/2017   Secondary hyperparathyroidism of renal origin ([place]) 09/01/2017   Disorder of phosphorus metabolism, unspecified 08/25/2017   Anemia in chronic kidney disease 08/24/2017   Chest pain  07/07/2017   Left ventricular hypertrophy 06/17/2017   ESRD on dialysis [facility]) 05/21/2011   Diabetic retinopathy 37/90/2409   Diastolic dysfunction 73/53/2992   Hyperlipidemia 01/30/2011   Type II diabetes mellitus with complication, uncontrolled ([place]) 01/29/2011   Neuropathy 01/29/2011   Hypertension     Past Surgical History:  Procedure Laterality Date   AMPUTATION Left 12/27/2018   Procedure: LEFT THUMB REVISION AMPUTATION DIGIT;  Surgeon: Charlotte Crumb, MD;  Location: [place];  Service: Orthopedics;  Laterality: Left;   AV FISTULA  PLACEMENT Left    AV FISTULA PLACEMENT Right 03/12/2019   Procedure: INSERTION OF ARTERIOVENOUS (AV) GORE-TEX GRAFT ARM;  Surgeon: Elam Dutch, MD;  Location: [place];  Service: Vascular;  Laterality: Right;   AV FISTULA PLACEMENT Right 08/13/2019   Procedure: RIGHT UPPER ARM ARTERIOVENOUS (AV) FISTULA CREATION WITH BASILIC VEIN;  Surgeon: Elam Dutch, MD;  Location: [place];  Service: Vascular;  Laterality: Right;   [place]; 1997   x 2   COLONOSCOPY     EYE SURGERY Bilateral    cataract surgery   INSERTION OF DIALYSIS CATHETER Right 09/26/2018   Procedure: INSERTION OF DIALYSIS CATHETER Right Internal Jugular.;  Surgeon: Elam Dutch, MD;  Location: [place];  Service: Vascular;  Laterality: Right;   IR FLUORO GUIDE CV LINE RIGHT  05/04/2019   IR US GUIDE VASC ACCESS RIGHT  05/04/2019   LIGATION OF ARTERIOVENOUS  FISTULA Left 09/26/2018   Procedure: LIGATION OF ARTERIOVENOUS  FISTULA LEFT ARM;  Surgeon: Elam Dutch, MD;  Location: [place];  Service: Vascular;  Laterality: Left;   PERICARDIAL WINDOW  02/2004   for pericardial effusion   PERIPHERAL VASCULAR INTERVENTION Right 10/26/2019   Procedure: PERIPHERAL VASCULAR INTERVENTION;  Surgeon: Elam Dutch, MD;  Location: [place] [facility];  Service: Cardiovascular;  Laterality: Right;  arm  AV fistula   TUBAL LIGATION  05/1995   UPPER EXTREMITY VENOGRAPHY N/A 06/22/2019   Procedure: UPPER EXTREMITY VENOGRAPHY - Right Central;  Surgeon: Elam Dutch, MD;  Location: [place] [facility];  Service: Cardiovascular;  Laterality: N/A;     OB History   No obstetric history on file.     Family History  Problem Relation Age of Onset   Cancer Brother    Heart disease Father        Died age 11   Diabetes Father    Hyperlipidemia Father    Hypertension Father    Stroke Father    Diabetes Mother    Hypertension Mother    Stroke Mother    Dementia Mother    Diabetes Sister    Diabetes Brother    Hypertension  Sister    Hypertension Brother     Social History   Tobacco Use   Smoking status: Former    Years: 33.00    Types: Cigarettes    Quit date: 07/17/2019    Years since quitting: 1.2   Smokeless tobacco: Never   Tobacco comments:    using NIcotene gum, rare 1/2 cigarette  Vaping Use   Vaping Use: Never used  Substance Use Topics   Alcohol use: No   Drug use: No    Home Medications Prior to Admission medications   Medication Sig Start Date End Date Taking? Authorizing Provider  albuterol (VENTOLIN HFA) 108 (90 Base) MCG/ACT inhaler Inhale 2 puffs into the lungs every 6 (six) hours as needed for wheezing or shortness of breath. 02/22/20   Lala Lund  K, MD  AURYXIA 1 GM 210 MG(Fe) tablet Take 630-1,050 mg by mouth See admin instructions. Take 5 tablets (1050 mg) by mouth with each meal & take 3 tablets (630 mg) by mouth with each snack 05/29/18   [provider]  b complex-vitamin c-folic acid (NEPHRO-VITE) 0.8 MG TABS tablet Take 1 tablet by mouth every Monday, Wednesday, and Friday with hemodialysis. 04/12/19   [provider]  calcitRIOL (ROCALTROL) 0.5 MCG capsule Take 0.5 mcg by mouth as directed. Given at Dialysis    [provider]  glucose blood (ONETOUCH VERIO) test strip 1 each by Other route 2 (two) times daily. And lancets 2/day 04/10/20   Renato Shin, MD  HUMIRA PEN 40 MG/0.4ML PNKT SMARTSIG:40 Milligram(s) SUB-Q Every 2 Weeks 08/22/20   [provider]  hydrOXYzine (ATARAX/VISTARIL) 25 MG tablet Take 25 mg by mouth daily.    [provider]  Insulin Lispro Prot & Lispro (HUMALOG 75/25 MIX) (75-25) 100 UNIT/ML Kwikpen Inject 35 Units into the skin 2 (two) times daily with a meal. 05/01/20   Renato Shin, MD  methocarbamol (ROBAXIN) 500 MG tablet Take 1 tablet (500 mg total) by mouth every 8 (eight) hours as needed for muscle spasms. 04/25/20   Lucrezia Starch, MD  Methoxy PEG-Epoetin Beta (MIRCERA IJ) Mircera 09/01/19   [provider]  metoprolol succinate (TOPROL-XL) 100 MG 24 hr tablet Take 100 mg by mouth daily. Take with or immediately following a meal.    [provider]  NARCAN 4 MG/0.1ML LIQD nasal spray kit Place 1 spray into the nose as needed (accidental overdose.).  10/10/19   [provider]  Oxycodone HCl 10 MG TABS Take 10 mg by mouth 3 (three) times daily. 02/08/20   [provider]  pramipexole (MIRAPEX) 0.5 MG tablet Take 0.5 mg by mouth daily.  05/18/17   [provider]  predniSONE (DELTASONE) 10 MG tablet TAKE 4 TABLETS (40 MG TOTAL) BY MOUTH DAILY WITH BREAKFAST FOR 4 DAYS. 02/28/20 02/27/21  Donne Hazel, MD  rosuvastatin (CRESTOR) 40 MG tablet TAKE 1 TABLET BY MOUTH DAILY *PATIENT NEEDS APPOINTMENT* 08/21/20   Minus Breeding, MD    Allergies    Bupropion  Review of Systems   Review of Systems  Constitutional:  Negative for fever.  HENT:  Positive for hearing loss. Negative for ear discharge, ear pain and tinnitus.    Physical Exam Updated Vital Signs BP (!) 161/71 (BP Location: Left Arm)   Pulse 81   Temp 98.3 F (36.8 C) (Oral)   Resp 18   LMP  (LMP Unknown) Comment: LMP Feb 2011  SpO2 92%   Physical Exam Vitals and nursing note reviewed.  Constitutional:      General: She is not in acute distress.    Appearance: She is well-developed.  HENT:     Head: Atraumatic.     Ears:     Comments: Right ear: TM is obscured by cerumen impaction.  No obvious foreign body noted.  No ear canal edema or rash.  Earlobe nontender.  Decreased hearing with finger rubs when compared to left ear.  Left ear: Normal TM, mild cerumen noted. Eyes:     Conjunctiva/sclera: Conjunctivae normal.  Pulmonary:     Effort: Pulmonary effort is normal.  Musculoskeletal:     Cervical back: Neck supple.  Skin:    Findings: No rash.  Neurological:     Mental Status: She is alert.  Psychiatric:  Mood and Affect: Mood normal.    ED Results / Procedures /  Treatments   Labs (all labs ordered are listed, but only abnormal results are displayed) Labs Reviewed - No data to display  EKG None  Radiology No results found.  Procedures .Ear Cerumen Removal  Date/Time: 10/01/2020 4:42 PM Performed by: Domenic Moras, PA-C Authorized by: Domenic Moras, PA-C   Consent:    Consent obtained:  Verbal   Consent given by:  Patient   Risks, benefits, and alternatives were discussed: yes     Risks discussed:  Bleeding, infection and incomplete removal   Alternatives discussed:  No treatment Universal protocol:    Procedure explained and questions answered to patient or proxy's satisfaction: yes     Patient identity confirmed:  Verbally with patient and arm band Procedure details:    Location:  R ear   Procedure type: curette     Procedure outcomes: unable to remove cerumen   Post-procedure details:    Inspection:  Some cerumen remaining and bleeding   Hearing quality:  Diminished   Procedure completion:  Tolerated with difficulty   Medications Ordered in ED Medications - No data to display  ED Course  I have reviewed the triage vital signs and the nursing notes.  Pertinent labs & imaging results that were available during my care of the patient were reviewed by me and considered in my medical decision making (see chart for details).    MDM Rules/Calculators/A&P                           BP (!) 161/71 (BP Location: Left Arm)   Pulse 81   Temp 98.3 F (36.8 C) (Oral)   Resp 18   LMP  (LMP Unknown) Comment: LMP Feb 2011  SpO2 92%  The patient was noted to be hypertensive today in the emergency department. I have spoken with the patient regarding hypertension and the need for improved management. I instructed the patient to followup with the Primary care doctor within 4 days to improve the management of the patient's hypertension. I also counseled the patient regarding the signs and symptoms which would require an emergent visit to an  emergency department for hypertensive urgency and/or hypertensive emergency. The patient understood the need for improved hypertensive management.   Final Clinical Impression(s) / ED Diagnoses Final diagnoses:  Impacted cerumen of right ear  Hearing loss of right ear, unspecified hearing loss type    Rx / DC Orders ED Discharge Orders          Ordered    carbamide peroxide (DEBROX) 6.5 % OTIC solution  2 times daily        10/01/20 1640           4:04 PM Patient here with decreased hearing to right ear.  Exam reveals cerumen impaction involving the affected area.  Suspect this is the source of her hearing loss.  Will perform ear irrigation and cerumen removal.  4:38 PM Attempt to irrigate ear as well as using ear curette to remove cerumen.  And we were able to remove a moderate amount of earwax however it did irritate her ear canal and patient report no significant improvement of hearing.  Plan to discharge home with a prescription for Debrox to help with cerumen impaction but encourage patient to follow-up with ENT for outpatient management if her symptoms persist.  Care discussed with DR. Haviland.    Domenic Moras, PA-C  10/01/20 1643    Isla Pence, MD 10/01/20 1705

## 2020-10-07 ENCOUNTER — Other Ambulatory Visit: Payer: Self-pay | Admitting: Cardiology

## 2020-10-08 ENCOUNTER — Other Ambulatory Visit: Payer: Self-pay | Admitting: Cardiology

## 2020-10-09 ENCOUNTER — Other Ambulatory Visit: Payer: Self-pay | Admitting: Cardiology

## 2020-10-14 ENCOUNTER — Ambulatory Visit: Payer: Medicare Other | Admitting: Pulmonary Disease

## 2020-10-15 ENCOUNTER — Other Ambulatory Visit: Payer: Self-pay | Admitting: Cardiology

## 2020-10-23 ENCOUNTER — Ambulatory Visit (HOSPITAL_BASED_OUTPATIENT_CLINIC_OR_DEPARTMENT_OTHER): Payer: Medicare Other | Attending: Pulmonary Disease | Admitting: Pulmonary Disease

## 2020-10-23 ENCOUNTER — Other Ambulatory Visit: Payer: Self-pay

## 2020-10-23 DIAGNOSIS — G4733 Obstructive sleep apnea (adult) (pediatric): Secondary | ICD-10-CM | POA: Insufficient documentation

## 2020-10-23 DIAGNOSIS — E669 Obesity, unspecified: Secondary | ICD-10-CM | POA: Insufficient documentation

## 2020-10-23 DIAGNOSIS — R0902 Hypoxemia: Secondary | ICD-10-CM | POA: Insufficient documentation

## 2020-10-23 DIAGNOSIS — R0683 Snoring: Secondary | ICD-10-CM | POA: Diagnosis present

## 2020-10-23 DIAGNOSIS — G4700 Insomnia, unspecified: Secondary | ICD-10-CM | POA: Insufficient documentation

## 2020-10-26 ENCOUNTER — Telehealth: Payer: Self-pay | Admitting: Pulmonary Disease

## 2020-10-26 NOTE — Telephone Encounter (Signed)
Call patient  Sleep study result  Date of study: 10/23/2020  Impression: Mild obstructive sleep apnea Sleep maintenance insomnia Snoring  Recommendation: Optimize sleep hygiene Avoid supine sleep as possible  Current study was limited by insomnia  Return for follow-up to further discuss options of treatment/further evaluation

## 2020-10-26 NOTE — Procedures (Signed)
POLYSOMNOGRAPHY  Last, First: [name], [name] MRN: [id number] Gender: Female Age (years): 21 Weight (lbs): 240 DOB: Apr 15, 1965 BMI: 36 Primary Care: No PCP Epworth Score: <br> Referring: Laurin Coder MD Technician: Zadie Rhine Interpreting: Laurin Coder MD Study Type: NPSG Ordered Study Type: Split Night CPAP Study date: 10/23/2020 Location: [place] CLINICAL INFORMATION [name] is a 55 year old Female and was referred to the sleep center for evaluation of G47.30 Sleep Apnea, Unspecified (780.57). Indications include Obesity, Snoring.  MEDICATIONS Patient self administered medications include: N/A. Medications administered during study include No sleep medicine administered.  SLEEP STUDY TECHNIQUE A multi-channel overnight Polysomnography study was performed. The channels recorded and monitored were central and occipital EEG, electrooculogram (EOG), submentalis EMG (chin), nasal and oral airflow, thoracic and abdominal wall motion, anterior tibialis EMG, snore microphone, electrocardiogram, and a pulse oximetry. TECHNICIAN COMMENTS Comments added by Technician: No restroom visted. Patient requested to end study early due to discomfort with equipment. Patient talked in his/her sleep. Patient had difficulty initiating sleep. Patient was restless all through the night. Comments added by Scorer: N/A SLEEP ARCHITECTURE The study was initiated at 10:03:10 PM and terminated at 1:36:04 AM. The total recorded time was 212.9 minutes. EEG confirmed total sleep time was 129 minutes yielding a sleep efficiency of 60.6%%. Sleep onset after lights out was 19.4 minutes with a REM latency of N/A minutes. The patient spent 34.1%% of the night in stage N1 sleep, 61.2%% in stage N2 sleep, 4.7%% in stage N3 and 0% in REM. Wake after sleep onset (WASO) was 64.5 minutes. The Arousal Index was 87.4/hour. RESPIRATORY PARAMETERS There were a total of 17 respiratory disturbances out of  which 1 were apneas ( 1 obstructive, 0 mixed, 0 central) and 16 hypopneas. The apnea/hypopnea index (AHI) was 7.9 events/hour. The central sleep apnea index was 0 events/hour. The REM AHI was N/A events/hour and NREM AHI was 7.9 events/hour. The supine AHI was 40.0 events/hour and the non supine AHI was 7.5 supine during 1.16% of sleep. Respiratory disturbances were associated with oxygen desaturation down to a nadir of 60.0% during sleep. The mean oxygen saturation during the study was 86.3%. The cumulative time under 88% oxygen saturation was 5.5 minutes.  LEG MOVEMENT DATA The total leg movements were 0 with a resulting leg movement index of 0.0/hr .Associated arousal with leg movement index was 0.0/hr.  CARDIAC DATA The underlying cardiac rhythm was most consistent with sinus rhythm. Mean heart rate during sleep was 77.6 bpm. Additional rhythm abnormalities include None.  IMPRESSIONS - Mild Obstructive Sleep apnea(OSA) - EKG showed no cardiac abnormalities. - Mild Oxygen Desaturation - The patient snored with moderate snoring volume. - No significant periodic leg movements(PLMs) during sleep. However, no significant associated arousals. - Study was ended early secondary to patient not been able to sleep  DIAGNOSIS - Obstructive Sleep Apnea (G47.33) - Sleep maintenance insomnia - Nocturnal Hypoxemia (G47.36)  RECOMMENDATIONS - Positional therapy avoiding supine position during sleep. - Very mild obstructive sleep apnea. Return to discuss treatment options. - Avoid alcohol, sedatives and other CNS depressants that may worsen sleep apnea and disrupt normal sleep architecture. - Sleep hygiene should be reviewed to assess factors that may improve sleep quality. - Weight management and regular exercise should be initiated or continued.  [Electronically signed] 10/26/2020 07:22 PM  Sherrilyn Rist MD NPI: [id number]

## 2020-10-27 NOTE — Telephone Encounter (Signed)
ATC, could not leave VM due to VM being full

## 2020-10-31 ENCOUNTER — Other Ambulatory Visit: Payer: Self-pay | Admitting: Cardiology

## 2020-11-02 ENCOUNTER — Other Ambulatory Visit: Payer: Self-pay

## 2020-11-02 ENCOUNTER — Emergency Department (HOSPITAL_COMMUNITY)
Admission: EM | Admit: 2020-11-02 | Discharge: 2020-11-02 | Disposition: A | Discharge status: Home / Self Care | Payer: Medicare Other | Attending: Emergency Medicine | Admitting: Emergency Medicine

## 2020-11-02 ENCOUNTER — Encounter (HOSPITAL_COMMUNITY): Payer: Self-pay | Admitting: *Deleted

## 2020-11-02 DIAGNOSIS — Z5321 Procedure and treatment not carried out due to patient leaving prior to being seen by health care provider: Secondary | ICD-10-CM | POA: Diagnosis not present

## 2020-11-02 DIAGNOSIS — R159 Full incontinence of feces: Secondary | ICD-10-CM | POA: Diagnosis not present

## 2020-11-02 DIAGNOSIS — R112 Nausea with vomiting, unspecified: Secondary | ICD-10-CM | POA: Diagnosis not present

## 2020-11-02 DIAGNOSIS — R519 Headache, unspecified: Secondary | ICD-10-CM | POA: Diagnosis not present

## 2020-11-02 DIAGNOSIS — R109 Unspecified abdominal pain: Secondary | ICD-10-CM | POA: Insufficient documentation

## 2020-11-02 NOTE — ED Triage Notes (Signed)
The pt is dialysis  last dialysis was Friday  fistula  rt arm

## 2020-11-02 NOTE — ED Notes (Signed)
Patient called for room assignment with no response

## 2020-11-02 NOTE — Discharge Instructions (Addendum)
You were seen here today for chronic fecal incontinence and chronic left sided facial pain. Please follow up with your PCP for these issues. If you have any new or worsening symptom, or any concern, please return to your PCP.

## 2020-11-02 NOTE — ED Triage Notes (Signed)
Pt is c/o  abd pain lt side of her face is burning and she reports that she cannot hear from her rt all for months  nausea vomiting no diarhea

## 2020-11-03 NOTE — ED Provider Notes (Signed)
Emergency Medicine Provider Triage Evaluation Note  [name] , a 55 y.o. female  was evaluated in triage.  Pt complains of fecal incontinence for over a year that she has followed up with her PCP about and left facial pain to her ear for over 6 months in which she has also followed up with her PCP about.   Review of Systems  Positive: Fecal incontinence, facial pain Negative:   Physical Exam  BP (!) 118/51 (BP Location: Right Arm)   Pulse 78   Temp 99.2 F (37.3 C) (Oral)   Resp 16   Ht 5\' 8"  (1.727 m)   Wt 110 kg   LMP  (LMP Unknown) Comment: LMP Feb 2011  SpO2 98%   BMI 36.87 kg/m  Gen:   Awake, no distress   Resp:  Normal effort  MSK:   Moves extremities without difficulty  Other:  No facial droop. Tms clear bilaterally.   Medical Decision Making  Medically screening exam initiated at 12:21 AM.  Appropriate orders placed.  [name] was informed that the remainder of the evaluation will be completed by another provider, this initial triage assessment does not replace that evaluation, and the importance of remaining in the ED until their evaluation is complete.     Sherrell Puller, PA-C 11/03/20 Jennye Boroughs, MD 11/03/20 [phone]

## 2020-11-06 ENCOUNTER — Ambulatory Visit (INDEPENDENT_AMBULATORY_CARE_PROVIDER_SITE_OTHER): Payer: Medicare Other | Admitting: Podiatry

## 2020-11-06 ENCOUNTER — Other Ambulatory Visit: Payer: Self-pay

## 2020-11-06 DIAGNOSIS — L97919 Non-pressure chronic ulcer of unspecified part of right lower leg with unspecified severity: Secondary | ICD-10-CM | POA: Diagnosis not present

## 2020-11-06 DIAGNOSIS — I83019 Varicose veins of right lower extremity with ulcer of unspecified site: Secondary | ICD-10-CM | POA: Diagnosis not present

## 2020-11-06 DIAGNOSIS — E1142 Type 2 diabetes mellitus with diabetic polyneuropathy: Secondary | ICD-10-CM | POA: Diagnosis not present

## 2020-11-06 DIAGNOSIS — L97929 Non-pressure chronic ulcer of unspecified part of left lower leg with unspecified severity: Secondary | ICD-10-CM

## 2020-11-06 DIAGNOSIS — B351 Tinea unguium: Secondary | ICD-10-CM

## 2020-11-06 DIAGNOSIS — I83029 Varicose veins of left lower extremity with ulcer of unspecified site: Secondary | ICD-10-CM | POA: Diagnosis not present

## 2020-11-06 MED ORDER — MUPIROCIN 2 % EX OINT: 1.0000 "application " | TOPICAL_OINTMENT | Freq: Two times a day (BID) | CUTANEOUS | 2 refills | Status: DC

## 2020-11-10 NOTE — Progress Notes (Signed)
  Subjective:  Patient ID: [name], female    DOB: [birth year],  MRN: [id number]  Chief Complaint  Patient presents with   Nail Problem      [facility]  - Left foot great toe nail coming off    55 y.o. female presents with the above complaint. History confirmed with patient.  Great toenail feels loose.  Her A1c is 8/9 percent she is not sure.  She has a history of venous insufficiency and bilateral ulcerations of both lower legs which have recurred.  She now has 1 on the top of the foot is started as a blister.  She previously was at the wound care center for these.  Objective:  Physical Exam: warm, good capillary refill.  Dark-colored skin with hemosiderosis and thin shiny atrophic skin.  There is a superficial blistering with bleeding on the left dorsal foot does not penetrate through the dermis.  Small full-thickness ulcerations on the bilateral lower leg in the gaiter area Left Foot: dystrophic yellowed discolored nail plates with subungual debris Right Foot: dystrophic yellowed discolored nail plates with subungual debris  Assessment:   1. Venous ulcer-leg syndrome, bilateral ([place])   2. Dermatophytosis of nail   3. Diabetic peripheral neuropathy associated with type 2 diabetes mellitus ([place])      Plan:  Patient was evaluated and treated and all questions answered.  Patient educated on diabetes. Discussed proper diabetic foot care and discussed risks and complications of disease. Educated patient in depth on reasons to return to the office immediately should he/she discover anything concerning or new on the feet. All questions answered. Discussed proper shoes as well.   Discussed the etiology and treatment options for the condition in detail with the patient. Educated patient on the topical and oral treatment options for mycotic nails. Recommended debridement of the nails today. Sharp and mechanical debridement performed of all painful and mycotic nails today. Nails debrided in  length and thickness using a nail nipper to level of comfort. Discussed treatment options including appropriate shoe gear. Follow up as needed for painful nails.  I referred her to the wound care center for the bilateral lower leg and foot ulcerations  Return if symptoms worsen or fail to improve, for will refer to wound care center .

## 2020-11-12 ENCOUNTER — Encounter: Payer: Self-pay | Admitting: Pulmonary Disease

## 2020-11-12 ENCOUNTER — Emergency Department (HOSPITAL_COMMUNITY)
Admission: EM | Admit: 2020-11-12 | Discharge: 2020-11-12 | Disposition: A | Discharge status: Home / Self Care | Payer: Medicare Other | Attending: Emergency Medicine | Admitting: Emergency Medicine

## 2020-11-12 ENCOUNTER — Emergency Department (HOSPITAL_COMMUNITY): Payer: Medicare Other

## 2020-11-12 ENCOUNTER — Encounter (HOSPITAL_COMMUNITY): Payer: Self-pay | Admitting: Emergency Medicine

## 2020-11-12 DIAGNOSIS — I2510 Atherosclerotic heart disease of native coronary artery without angina pectoris: Secondary | ICD-10-CM | POA: Insufficient documentation

## 2020-11-12 DIAGNOSIS — Z992 Dependence on renal dialysis: Secondary | ICD-10-CM | POA: Diagnosis not present

## 2020-11-12 DIAGNOSIS — E1122 Type 2 diabetes mellitus with diabetic chronic kidney disease: Secondary | ICD-10-CM | POA: Insufficient documentation

## 2020-11-12 DIAGNOSIS — Z79899 Other long term (current) drug therapy: Secondary | ICD-10-CM | POA: Diagnosis not present

## 2020-11-12 DIAGNOSIS — I509 Heart failure, unspecified: Secondary | ICD-10-CM | POA: Diagnosis not present

## 2020-11-12 DIAGNOSIS — M25511 Pain in right shoulder: Secondary | ICD-10-CM | POA: Diagnosis present

## 2020-11-12 DIAGNOSIS — Z8616 Personal history of COVID-19: Secondary | ICD-10-CM | POA: Insufficient documentation

## 2020-11-12 DIAGNOSIS — W1839XA Other fall on same level, initial encounter: Secondary | ICD-10-CM | POA: Diagnosis not present

## 2020-11-12 DIAGNOSIS — Z87891 Personal history of nicotine dependence: Secondary | ICD-10-CM | POA: Diagnosis not present

## 2020-11-12 DIAGNOSIS — N186 End stage renal disease: Secondary | ICD-10-CM | POA: Insufficient documentation

## 2020-11-12 DIAGNOSIS — Z794 Long term (current) use of insulin: Secondary | ICD-10-CM | POA: Diagnosis not present

## 2020-11-12 DIAGNOSIS — I132 Hypertensive heart and chronic kidney disease with heart failure and with stage 5 chronic kidney disease, or end stage renal disease: Secondary | ICD-10-CM | POA: Insufficient documentation

## 2020-11-12 LAB — CBC WITH DIFFERENTIAL/PLATELET
Abs Immature Granulocytes: 0.07 10*3/uL (ref 0.00–0.07)
Basophils Absolute: 0 10*3/uL (ref 0.0–0.1)
Basophils Relative: 0 %
Eosinophils Absolute: 0.3 10*3/uL (ref 0.0–0.5)
Eosinophils Relative: 4 %
HCT: 30.1 % — ABNORMAL LOW (ref 36.0–46.0)
Hemoglobin: 9.6 g/dL — ABNORMAL LOW (ref 12.0–15.0)
Immature Granulocytes: 1 %
Lymphocytes Relative: 12 %
Lymphs Abs: 1 10*3/uL (ref 0.7–4.0)
MCH: 31.4 pg (ref 26.0–34.0)
MCHC: 31.9 g/dL (ref 30.0–36.0)
MCV: 98.4 fL (ref 80.0–100.0)
Monocytes Absolute: 0.5 10*3/uL (ref 0.1–1.0)
Monocytes Relative: 6 %
Neutro Abs: 6.7 10*3/uL (ref 1.7–7.7)
Neutrophils Relative %: 77 %
Platelets: 240 10*3/uL (ref 150–400)
RBC: 3.06 MIL/uL — ABNORMAL LOW (ref 3.87–5.11)
RDW: 14.4 % (ref 11.5–15.5)
WBC: 8.6 10*3/uL (ref 4.0–10.5)
nRBC: 0 % (ref 0.0–0.2)

## 2020-11-12 LAB — COMPREHENSIVE METABOLIC PANEL
ALT: 13 U/L (ref 0–44)
AST: 14 U/L — ABNORMAL LOW (ref 15–41)
Albumin: 3 g/dL — ABNORMAL LOW (ref 3.5–5.0)
Alkaline Phosphatase: 149 U/L — ABNORMAL HIGH (ref 38–126)
Anion gap: 18 — ABNORMAL HIGH (ref 5–15)
BUN: 47 mg/dL — ABNORMAL HIGH (ref 6–20)
CO2: 24 mmol/L (ref 22–32)
Calcium: 9.2 mg/dL (ref 8.9–10.3)
Chloride: 91 mmol/L — ABNORMAL LOW (ref 98–111)
Creatinine, Ser: 8.14 mg/dL — ABNORMAL HIGH (ref 0.44–1.00)
GFR, Estimated: 5 mL/min — ABNORMAL LOW (ref >60)
Glucose, Bld: 226 mg/dL — ABNORMAL HIGH (ref 70–99)
Potassium: 4.8 mmol/L (ref 3.5–5.1)
Sodium: 133 mmol/L — ABNORMAL LOW (ref 135–145)
Total Bilirubin: 0.8 mg/dL (ref 0.3–1.2)
Total Protein: 8.5 g/dL — ABNORMAL HIGH (ref 6.5–8.1)

## 2020-11-12 MED ORDER — LIDOCAINE 5 % EX PTCH: 2.0000 | MEDICATED_PATCH | CUTANEOUS | 0 refills | Status: DC

## 2020-11-12 NOTE — ED Triage Notes (Signed)
Pt reports falling 3 days ago and fell on right shoulder. Pain and tenderness started yesterday. Swelling to right hand.

## 2020-11-12 NOTE — Telephone Encounter (Signed)
Letter has been sent to patient. Closing encounter.

## 2020-11-12 NOTE — Discharge Instructions (Addendum)
Your x-rays look good today.  There is no fracture in your shoulder.  I believe you may have a ligamental injury.  Please follow-up with sports medicine about this injury.  There is an office attached to your discharge papers.  Please also make sure that you go to dialysis on Friday.  I am sending you lidocaine patches to the pharmacy.  You may put these on your shoulder, however make sure that you will have 12 hours patch free.  That means you can wear during the day and take them off at night, or vice versa.  It is up to you.  You may also utilize Tylenol.  Avoid ibuprofen, naproxen and other NSAIDs due to your kidney disease.  It was a pleasure to meet you and I hope that you feel better.

## 2020-11-12 NOTE — ED Provider Notes (Signed)
[facility] EMERGENCY DEPARTMENT Provider Note   CSN: [id number] Arrival date & time: 11/12/20  1140     History Chief Complaint  Patient presents with   Shoulder Injury    [name] is a 55 y.o. female with a history of chronic kidney disease on dialysis, coronary artery disease and congestive heart failure presenting today with a complaint of right shoulder pain.  Patient reports that she fell down 3 days ago and now is having intense pain and difficulty moving her shoulder.  Did not hit her head.  Thinks she may have tripped over something and landed on the floor.  She denies any numbness or tingling.  Also complaining of a swollen wrist.  Has been utilizing her oxycodone and Tylenol for this pain and does not feel as though it is happening.  Of note she missed dialysis today to come to the emergency department.  Next dialysis session on Friday.    Past Medical History:  Diagnosis Date   Anemia    Anxiety    Arthritis    "knees" "hands", "RA"   CAD (coronary artery disease)    Nonobstructive on CT 2019   CHF (congestive heart failure) ([place])    COVID-19 10/2018   Dyspnea    "when I have too much fluid."   ESRD (end stage renal disease) ([place])    Dialysis [facility]- [address]   Headache(784.0)    Heart murmur    High cholesterol    History of blood transfusion    Hypertension    Mitral regurgitation    moderate to severe MR 10/2018 echo   Nerve pain    "they say I have L5 nerve damage; my lumbar"   Pericardial effusion    Pneumonia    ; 11/16/2018- "touch of pneumonia" - seen in ED- 11/14/2018- on antibiotic. Has had it x 2   PVD (peripheral vascular disease) ([place])    Right leg stent in [place].  (No records)   Restless legs    Sleep apnea    does not use Cpap   Thoracic ascending aortic aneurysm    4.4 cm 11/14/18 CTA   Type II diabetes mellitus [facility])     Patient Active Problem List   Diagnosis Date Noted   Thyroid nodule 04/10/2020    Acute respiratory failure ([place]) 02/26/2020   Chronic pain 03/20/2019   Preop cardiovascular exam 12/19/2018   Dyslipidemia 11/20/2018   Shortness of breath 10/23/2018   Acute respiratory distress 10/23/2018   Chronic bilateral pleural effusions 10/23/2018   Obesity 10/23/2018   Coronary artery disease 10/23/2018   Tobacco abuse 10/23/2018   Volume overload 10/23/2018   Unspecified protein-calorie malnutrition ([place]) 10/11/2018   Pneumonia 10/10/2018   COVID-19 virus detected 10/10/2018   Acute encephalopathy 10/09/2018   End-stage renal disease on hemodialysis ([place]) 09/27/2018   Acute respiratory failure with hypoxia ([place]) 09/26/2018   Chronic pain disorder 09/26/2018   Other encephalopathy 09/26/2018   COVID-19 virus infection 92/92/4462   Acute metabolic encephalopathy 86/38/1771   Gangrene ([place]) 09/18/2018   Allergy, unspecified, initial encounter 09/04/2018   Anaphylactic shock, unspecified, initial encounter 09/04/2018   Left arm pain 06/27/2018   Educated about COVID-19 virus infection 06/27/2018   Iron deficiency anemia, unspecified 01/10/2018   Coagulation defect, unspecified ([place]) 12/06/2017   Diarrhea, unspecified 10/11/2017   Anemia 09/26/2017   Left hip pain 09/26/2017   Neck pain 09/26/2017   Leukocytosis 09/26/2017   Pruritus, unspecified 09/22/2017   Secondary  hyperparathyroidism of renal origin ([place]) 09/01/2017   Disorder of phosphorus metabolism, unspecified 08/25/2017   Anemia in chronic kidney disease 08/24/2017   Chest pain 07/07/2017   Left ventricular hypertrophy 06/17/2017   ESRD on dialysis [facility]) 05/21/2011   Diabetic retinopathy 96/22/2979   Diastolic dysfunction 89/21/1941   Hyperlipidemia 01/30/2011   Type II diabetes mellitus with complication, uncontrolled ([place]) 01/29/2011   Neuropathy 01/29/2011   Hypertension     Past Surgical History:  Procedure Laterality Date   AMPUTATION Left 12/27/2018   Procedure: LEFT THUMB REVISION AMPUTATION  DIGIT;  Surgeon: Charlotte Crumb, MD;  Location: [place];  Service: Orthopedics;  Laterality: Left;   AV FISTULA PLACEMENT Left    AV FISTULA PLACEMENT Right 03/12/2019   Procedure: INSERTION OF ARTERIOVENOUS (AV) GORE-TEX GRAFT ARM;  Surgeon: Elam Dutch, MD;  Location: [place];  Service: Vascular;  Laterality: Right;   AV FISTULA PLACEMENT Right 08/13/2019   Procedure: RIGHT UPPER ARM ARTERIOVENOUS (AV) FISTULA CREATION WITH BASILIC VEIN;  Surgeon: Elam Dutch, MD;  Location: [place];  Service: Vascular;  Laterality: Right;   [place]; 1997   x 2   COLONOSCOPY     EYE SURGERY Bilateral    cataract surgery   INSERTION OF DIALYSIS CATHETER Right 09/26/2018   Procedure: INSERTION OF DIALYSIS CATHETER Right Internal Jugular.;  Surgeon: Elam Dutch, MD;  Location: [place];  Service: Vascular;  Laterality: Right;   IR FLUORO GUIDE CV LINE RIGHT  05/04/2019   IR US GUIDE VASC ACCESS RIGHT  05/04/2019   LIGATION OF ARTERIOVENOUS  FISTULA Left 09/26/2018   Procedure: LIGATION OF ARTERIOVENOUS  FISTULA LEFT ARM;  Surgeon: Elam Dutch, MD;  Location: [place];  Service: Vascular;  Laterality: Left;   PERICARDIAL WINDOW  02/2004   for pericardial effusion   PERIPHERAL VASCULAR INTERVENTION Right 10/26/2019   Procedure: PERIPHERAL VASCULAR INTERVENTION;  Surgeon: Elam Dutch, MD;  Location: [place] CV LAB;  Service: Cardiovascular;  Laterality: Right;  arm  AV fistula   TUBAL LIGATION  05/1995   UPPER EXTREMITY VENOGRAPHY N/A 06/22/2019   Procedure: UPPER EXTREMITY VENOGRAPHY - Right Central;  Surgeon: Elam Dutch, MD;  Location: [place] CV LAB;  Service: Cardiovascular;  Laterality: N/A;     OB History   No obstetric history on file.     Family History  Problem Relation Age of Onset   Cancer Brother    Heart disease Father        Died age 57   Diabetes Father    Hyperlipidemia Father    Hypertension Father    Stroke Father    Diabetes Mother     Hypertension Mother    Stroke Mother    Dementia Mother    Diabetes Sister    Diabetes Brother    Hypertension Sister    Hypertension Brother     Social History   Tobacco Use   Smoking status: Former    Years: 33.00    Types: Cigarettes    Quit date: 07/17/2019    Years since quitting: 1.3   Smokeless tobacco: Never   Tobacco comments:    using NIcotene gum, rare 1/2 cigarette  Vaping Use   Vaping Use: Never used  Substance Use Topics   Alcohol use: No   Drug use: No    Home Medications Prior to Admission medications   Medication Sig Start Date End Date Taking? Authorizing Provider  lidocaine (LIDODERM) 5 % Place  2 patches onto the skin daily. Remove & Discard patch within 12 hours or as directed by MD 11/12/20  Yes Jaleyah Longhi A, PA-C  albuterol (VENTOLIN HFA) 108 (90 Base) MCG/ACT inhaler Inhale 2 puffs into the lungs every 6 (six) hours as needed for wheezing or shortness of breath. 02/22/20   Thurnell Lose, MD  AURYXIA 1 GM 210 MG(Fe) tablet Take 630-1,050 mg by mouth See admin instructions. Take 5 tablets (1050 mg) by mouth with each meal & take 3 tablets (630 mg) by mouth with each snack 05/29/18   [provider]  b complex-vitamin c-folic acid (NEPHRO-VITE) 0.8 MG TABS tablet Take 1 tablet by mouth every Monday, Wednesday, and Friday with hemodialysis. 04/12/19   [provider]  calcitRIOL (ROCALTROL) 0.5 MCG capsule Take 0.5 mcg by mouth as directed. Given at Dialysis    [provider]  carbamide peroxide (DEBROX) 6.5 % OTIC solution Place 5 drops into both ears 2 (two) times daily. 10/01/20   Domenic Moras, PA-C  glucose blood (ONETOUCH VERIO) test strip 1 each by Other route 2 (two) times daily. And lancets 2/day 04/10/20   Renato Shin, MD  HUMIRA PEN 40 MG/0.4ML PNKT SMARTSIG:40 Milligram(s) SUB-Q Every 2 Weeks 08/22/20   [provider]  hydrOXYzine (ATARAX/VISTARIL) 25 MG tablet Take 25 mg by mouth daily.    [provider]  Insulin Lispro Prot & Lispro (HUMALOG 75/25 MIX) (75-25) 100 UNIT/ML Kwikpen Inject 35 Units into the skin 2 (two) times daily with a meal. 05/01/20   Renato Shin, MD  methocarbamol (ROBAXIN) 500 MG tablet Take 1 tablet (500 mg total) by mouth every 8 (eight) hours as needed for muscle spasms. 04/25/20   Lucrezia Starch, MD  Methoxy PEG-Epoetin Beta (MIRCERA IJ) Mircera 09/01/19   [provider]  metoprolol succinate (TOPROL-XL) 100 MG 24 hr tablet Take 100 mg by mouth daily. Take with or immediately following a meal.    [provider]  mupirocin ointment (BACTROBAN) 2 % Apply 1 application topically 2 (two) times daily. 11/06/20   McDonald, Stephan Minister, DPM  NARCAN 4 MG/0.1ML LIQD nasal spray kit Place 1 spray into the nose as needed (accidental overdose.).  10/10/19   [provider]  Oxycodone HCl 10 MG TABS Take 10 mg by mouth 3 (three) times daily. 02/08/20   [provider]  pramipexole (MIRAPEX) 0.5 MG tablet Take 0.5 mg by mouth daily.  05/18/17   [provider]  predniSONE (DELTASONE) 10 MG tablet TAKE 4 TABLETS (40 MG TOTAL) BY MOUTH DAILY WITH BREAKFAST FOR 4 DAYS. 02/28/20 02/27/21  Donne Hazel, MD  rosuvastatin (CRESTOR) 40 MG tablet TAKE 1 TABLET BY MOUTH DAILY *PATIENT NEEDS APPOINTMENT* 08/21/20   Minus Breeding, MD    Allergies    Bupropion  Review of Systems   Review of Systems  Respiratory:  Negative for chest tightness and shortness of breath.   Cardiovascular:  Negative for chest pain.  Gastrointestinal:  Negative for anal bleeding.  Musculoskeletal:  Positive for arthralgias and joint swelling. Negative for back pain.  Skin:  Negative for wound.  Neurological:  Negative for dizziness, syncope and headaches.  All other systems reviewed and are negative.  Physical Exam Updated Vital Signs BP (!) 122/59 (BP Location: Left Arm)   Pulse 78   Temp 98.5 F (36.9 C)   Resp 18   Ht $R'5\' 8"'lj$  (1.727 m)   Wt 111.1 kg    LMP  (LMP Unknown)  Comment: LMP Feb 2011  SpO2 100%   BMI 37.25 kg/m   Physical Exam Vitals and nursing note reviewed.  Constitutional:      Appearance: Normal appearance.  HENT:     Head: Normocephalic and atraumatic.  Eyes:     General: No scleral icterus.    Conjunctiva/sclera: Conjunctivae normal.  Pulmonary:     Effort: Pulmonary effort is normal. No respiratory distress.  Musculoskeletal:        General: No swelling, tenderness, deformity or signs of injury.     Comments: Patient is active range of motion limited due to pain.  Passive range of motion intact.  Majority of pain with shoulder flexion.  Shoulder is nontender to palpation.  Strong radial pulse.  Small amounts of swelling to right hand.  Skin:    General: Skin is warm and dry.     Findings: No lesion or rash.  Neurological:     Mental Status: She is alert.     Sensory: No sensory deficit.  Psychiatric:        Mood and Affect: Mood normal.        Behavior: Behavior normal.    ED Results / Procedures / Treatments   Labs (all labs ordered are listed, but only abnormal results are displayed) Labs Reviewed  COMPREHENSIVE METABOLIC PANEL - Abnormal; Notable for the following components:      Result Value   Sodium 133 (*)    Chloride 91 (*)    Glucose, Bld 226 (*)    BUN 47 (*)    Creatinine, Ser 8.14 (*)    Total Protein 8.5 (*)    Albumin 3.0 (*)    AST 14 (*)    Alkaline Phosphatase 149 (*)    GFR, Estimated 5 (*)    Anion gap 18 (*)    All other components within normal limits  CBC WITH DIFFERENTIAL/PLATELET - Abnormal; Notable for the following components:   RBC 3.06 (*)    Hemoglobin 9.6 (*)    HCT 30.1 (*)    All other components within normal limits    EKG None  Radiology DG Shoulder Right  Result Date: 11/12/2020 CLINICAL DATA:  Fall, shoulder pain EXAM: RIGHT SHOULDER - 2+ VIEW COMPARISON:  Shoulder radiographs 06/05/2019 FINDINGS: There is no acute fracture or dislocation. Shoulder  alignment is maintained. Degenerative changes about the [facility] joint are similar to the prior study. A right arm vascular stent and vascular calcifications are noted. IMPRESSION: No acute fracture or dislocation. Electronically Signed   By: Valetta Mole M.D.   On: 11/12/2020 16:37   DG Hand Complete Right  Result Date: 11/12/2020 CLINICAL DATA:  Fall, hand swelling EXAM: RIGHT HAND - COMPLETE 3+ VIEW COMPARISON:  None. FINDINGS: There is no acute fracture or dislocation. Bony alignment is normal. There is mild degenerative change at the index finger [facility] joint likely reflecting osteoarthritis. There is diffuse soft tissue swelling about the hand. There is no soft tissue gas. There is no radiopaque foreign body. IMPRESSION: 1. Diffuse soft tissue swelling about the hand; no soft tissue gas or radiopaque foreign body. 2. No acute osseous abnormality. 3. Mild degenerative changes at the index finger CMC joint. Electronically Signed   By: Valetta Mole M.D.   On: 11/12/2020 16:40    Procedures Procedures   Medications Ordered in ED Medications - No data to display  ED Course  I have reviewed the triage vital signs and the nursing notes.  Pertinent labs & imaging  results that were available during my care of the patient were reviewed by me and considered in my medical decision making (see chart for details).    MDM Rules/Calculators/A&P Patient was evaluated by me in the hallway.  She was in no acute distress, sleeping very hard.  Had to arouse the patient multiple times.  She was notified that her imaging looked okay.  No signs of fracture or foreign body.  Labs around baseline, and consistent with missing dialysis today.  Patient stable and without symptoms of fluid overload.  Safe to discharge until dialysis on Friday.  I have given the patient in a sports medicine office to follow-up on.  I will also give her lidocaine patches to use as she needs.  She may continue to take Tylenol however she needs to  steer clear of NSAIDs due to her kidney function.  We discussed all of this and patient agreeable to the plan.   Final Clinical Impression(s) / ED Diagnoses Final diagnoses:  Acute pain of right shoulder    Rx / DC Orders ED Discharge Orders          Ordered    lidocaine (LIDODERM) 5 %  Every 24 hours        11/12/20 1702             Shary Lamos, [place] A, PA-C 11/12/20 1723    Margette Fast, MD 11/19/20 [phone]

## 2020-11-12 NOTE — ED Provider Notes (Signed)
Emergency Medicine Provider Triage Evaluation Note  [name] , a 55 y.o. female  was evaluated in triage.  Pt complains of right shoulder pain.  Happened 3 days ago when she had a fall, worsened today.  Also has swelling in her right hand.  Reports she is no longer able to raise her right hand, she is able to move her right wrist and her fingers.  She is on dialysis Monday, Wednesday, Friday.  Missed dialysis today.  Denies any chest pain or shortness of breath, denies any swelling anywhere else..  Review of Systems  Positive: Right shoulder pain, right hand swelling Negative: Shortness of breath, chest pain, numbness, headache, weakness  Physical Exam  BP (!) 122/59 (BP Location: Left Arm)   Pulse 78   Temp 98.5 F (36.9 C)   Resp 18   Ht 5\' 8"  (1.727 m)   Wt 111.1 kg   LMP  (LMP Unknown) Comment: LMP Feb 2011  SpO2 100%   BMI 37.25 kg/m  Gen:   Awake, no distress   Resp:  Normal effort  MSK:   Decreased range of motion to right shoulder secondary to pain.  There is swelling to the right hand, no additional swelling.  Radial pulse equal bilaterally, no tenderness to the dorsal aspect of the right hand.  Good range of motion to the right wrist and extremities.  Cap refill less than 2. Other:    Medical Decision Making  Medically screening exam initiated at 3:14 PM.  Appropriate orders placed.  [name] was informed that the remainder of the evaluation will be completed by another provider, this initial triage assessment does not replace that evaluation, and the importance of remaining in the ED until their evaluation is complete.  Will check basic labs given patient missed dialysis.  We will also get radiograph of the shoulder and hand.   Catherine Raring, PA-C 11/12/20 1516    Catherine Pence, MD 11/13/20 [phone]

## 2020-11-12 NOTE — ED Notes (Signed)
No answer for triage.

## 2020-11-13 ENCOUNTER — Other Ambulatory Visit: Payer: Self-pay | Admitting: Cardiology

## 2020-11-17 ENCOUNTER — Emergency Department (HOSPITAL_COMMUNITY): Payer: Medicare Other

## 2020-11-17 ENCOUNTER — Inpatient Hospital Stay (HOSPITAL_COMMUNITY)
Admission: EM | Admit: 2020-11-17 | Discharge: 2020-11-27 | DRG: 208 | Disposition: A | Discharge status: Home Health | Payer: Medicare Other | Attending: Internal Medicine | Admitting: Internal Medicine

## 2020-11-17 DIAGNOSIS — L97819 Non-pressure chronic ulcer of other part of right lower leg with unspecified severity: Secondary | ICD-10-CM | POA: Diagnosis present

## 2020-11-17 DIAGNOSIS — R9389 Abnormal findings on diagnostic imaging of other specified body structures: Secondary | ICD-10-CM

## 2020-11-17 DIAGNOSIS — I472 Ventricular tachycardia, unspecified: Secondary | ICD-10-CM | POA: Diagnosis not present

## 2020-11-17 DIAGNOSIS — J189 Pneumonia, unspecified organism: Secondary | ICD-10-CM | POA: Diagnosis not present

## 2020-11-17 DIAGNOSIS — E782 Mixed hyperlipidemia: Secondary | ICD-10-CM | POA: Diagnosis not present

## 2020-11-17 DIAGNOSIS — J9622 Acute and chronic respiratory failure with hypercapnia: Secondary | ICD-10-CM | POA: Diagnosis not present

## 2020-11-17 DIAGNOSIS — E1165 Type 2 diabetes mellitus with hyperglycemia: Secondary | ICD-10-CM | POA: Diagnosis present

## 2020-11-17 DIAGNOSIS — M069 Rheumatoid arthritis, unspecified: Secondary | ICD-10-CM | POA: Diagnosis present

## 2020-11-17 DIAGNOSIS — R4182 Altered mental status, unspecified: Secondary | ICD-10-CM | POA: Diagnosis not present

## 2020-11-17 DIAGNOSIS — Z6837 Body mass index (BMI) 37.0-37.9, adult: Secondary | ICD-10-CM | POA: Diagnosis not present

## 2020-11-17 DIAGNOSIS — Z794 Long term (current) use of insulin: Secondary | ICD-10-CM

## 2020-11-17 DIAGNOSIS — T40601A Poisoning by unspecified narcotics, accidental (unintentional), initial encounter: Secondary | ICD-10-CM | POA: Diagnosis present

## 2020-11-17 DIAGNOSIS — Z8616 Personal history of COVID-19: Secondary | ICD-10-CM

## 2020-11-17 DIAGNOSIS — N186 End stage renal disease: Secondary | ICD-10-CM | POA: Diagnosis present

## 2020-11-17 DIAGNOSIS — R579 Shock, unspecified: Secondary | ICD-10-CM | POA: Diagnosis present

## 2020-11-17 DIAGNOSIS — Z79620 Long term (current) use of immunosuppressive biologic: Secondary | ICD-10-CM

## 2020-11-17 DIAGNOSIS — I5032 Chronic diastolic (congestive) heart failure: Secondary | ICD-10-CM | POA: Diagnosis present

## 2020-11-17 DIAGNOSIS — R0602 Shortness of breath: Secondary | ICD-10-CM

## 2020-11-17 DIAGNOSIS — M1731 Unilateral post-traumatic osteoarthritis, right knee: Secondary | ICD-10-CM | POA: Diagnosis not present

## 2020-11-17 DIAGNOSIS — D84821 Immunodeficiency due to drugs: Secondary | ICD-10-CM | POA: Diagnosis present

## 2020-11-17 DIAGNOSIS — Z20822 Contact with and (suspected) exposure to covid-19: Secondary | ICD-10-CM | POA: Diagnosis present

## 2020-11-17 DIAGNOSIS — Z992 Dependence on renal dialysis: Secondary | ICD-10-CM

## 2020-11-17 DIAGNOSIS — I959 Hypotension, unspecified: Secondary | ICD-10-CM

## 2020-11-17 DIAGNOSIS — N2581 Secondary hyperparathyroidism of renal origin: Secondary | ICD-10-CM | POA: Diagnosis present

## 2020-11-17 DIAGNOSIS — I132 Hypertensive heart and chronic kidney disease with heart failure and with stage 5 chronic kidney disease, or end stage renal disease: Secondary | ICD-10-CM | POA: Diagnosis present

## 2020-11-17 DIAGNOSIS — G928 Other toxic encephalopathy: Secondary | ICD-10-CM | POA: Diagnosis present

## 2020-11-17 DIAGNOSIS — J690 Pneumonitis due to inhalation of food and vomit: Secondary | ICD-10-CM | POA: Diagnosis present

## 2020-11-17 DIAGNOSIS — I83018 Varicose veins of right lower extremity with ulcer other part of lower leg: Secondary | ICD-10-CM | POA: Diagnosis present

## 2020-11-17 DIAGNOSIS — M898X9 Other specified disorders of bone, unspecified site: Secondary | ICD-10-CM | POA: Diagnosis present

## 2020-11-17 DIAGNOSIS — Z79891 Long term (current) use of opiate analgesic: Secondary | ICD-10-CM

## 2020-11-17 DIAGNOSIS — Z87891 Personal history of nicotine dependence: Secondary | ICD-10-CM

## 2020-11-17 DIAGNOSIS — I083 Combined rheumatic disorders of mitral, aortic and tricuspid valves: Secondary | ICD-10-CM | POA: Diagnosis present

## 2020-11-17 DIAGNOSIS — I953 Hypotension of hemodialysis: Secondary | ICD-10-CM | POA: Diagnosis not present

## 2020-11-17 DIAGNOSIS — D631 Anemia in chronic kidney disease: Secondary | ICD-10-CM | POA: Diagnosis not present

## 2020-11-17 DIAGNOSIS — I4729 Other ventricular tachycardia: Secondary | ICD-10-CM | POA: Diagnosis not present

## 2020-11-17 DIAGNOSIS — I2510 Atherosclerotic heart disease of native coronary artery without angina pectoris: Secondary | ICD-10-CM | POA: Diagnosis present

## 2020-11-17 DIAGNOSIS — L97829 Non-pressure chronic ulcer of other part of left lower leg with unspecified severity: Secondary | ICD-10-CM | POA: Diagnosis present

## 2020-11-17 DIAGNOSIS — I493 Ventricular premature depolarization: Secondary | ICD-10-CM | POA: Diagnosis not present

## 2020-11-17 DIAGNOSIS — Z79899 Other long term (current) drug therapy: Secondary | ICD-10-CM

## 2020-11-17 DIAGNOSIS — J9601 Acute respiratory failure with hypoxia: Secondary | ICD-10-CM | POA: Diagnosis present

## 2020-11-17 DIAGNOSIS — Z833 Family history of diabetes mellitus: Secondary | ICD-10-CM

## 2020-11-17 DIAGNOSIS — E1122 Type 2 diabetes mellitus with diabetic chronic kidney disease: Secondary | ICD-10-CM | POA: Diagnosis present

## 2020-11-17 DIAGNOSIS — Z452 Encounter for adjustment and management of vascular access device: Secondary | ICD-10-CM

## 2020-11-17 DIAGNOSIS — F05 Delirium due to known physiological condition: Secondary | ICD-10-CM | POA: Diagnosis not present

## 2020-11-17 DIAGNOSIS — E8770 Fluid overload, unspecified: Secondary | ICD-10-CM | POA: Diagnosis present

## 2020-11-17 DIAGNOSIS — E7800 Pure hypercholesterolemia, unspecified: Secondary | ICD-10-CM | POA: Diagnosis present

## 2020-11-17 DIAGNOSIS — G9340 Encephalopathy, unspecified: Secondary | ICD-10-CM | POA: Diagnosis present

## 2020-11-17 DIAGNOSIS — E669 Obesity, unspecified: Secondary | ICD-10-CM | POA: Diagnosis present

## 2020-11-17 DIAGNOSIS — R0609 Other forms of dyspnea: Secondary | ICD-10-CM

## 2020-11-17 DIAGNOSIS — E1151 Type 2 diabetes mellitus with diabetic peripheral angiopathy without gangrene: Secondary | ICD-10-CM | POA: Diagnosis present

## 2020-11-17 DIAGNOSIS — Z781 Physical restraint status: Secondary | ICD-10-CM

## 2020-11-17 DIAGNOSIS — R911 Solitary pulmonary nodule: Secondary | ICD-10-CM | POA: Diagnosis present

## 2020-11-17 DIAGNOSIS — Z83438 Family history of other disorder of lipoprotein metabolism and other lipidemia: Secondary | ICD-10-CM

## 2020-11-17 DIAGNOSIS — Z888 Allergy status to other drugs, medicaments and biological substances status: Secondary | ICD-10-CM

## 2020-11-17 DIAGNOSIS — G8929 Other chronic pain: Secondary | ICD-10-CM | POA: Diagnosis present

## 2020-11-17 DIAGNOSIS — Z8249 Family history of ischemic heart disease and other diseases of the circulatory system: Secondary | ICD-10-CM

## 2020-11-17 DIAGNOSIS — I83028 Varicose veins of left lower extremity with ulcer other part of lower leg: Secondary | ICD-10-CM | POA: Diagnosis present

## 2020-11-17 DIAGNOSIS — I7121 Aneurysm of the ascending aorta, without rupture: Secondary | ICD-10-CM | POA: Diagnosis present

## 2020-11-17 DIAGNOSIS — M25561 Pain in right knee: Secondary | ICD-10-CM | POA: Diagnosis present

## 2020-11-17 DIAGNOSIS — Z89012 Acquired absence of left thumb: Secondary | ICD-10-CM

## 2020-11-17 DIAGNOSIS — N2889 Other specified disorders of kidney and ureter: Secondary | ICD-10-CM | POA: Diagnosis present

## 2020-11-17 DIAGNOSIS — G4733 Obstructive sleep apnea (adult) (pediatric): Secondary | ICD-10-CM | POA: Diagnosis present

## 2020-11-17 DIAGNOSIS — J9602 Acute respiratory failure with hypercapnia: Secondary | ICD-10-CM | POA: Diagnosis present

## 2020-11-17 DIAGNOSIS — I5030 Unspecified diastolic (congestive) heart failure: Secondary | ICD-10-CM | POA: Diagnosis not present

## 2020-11-17 DIAGNOSIS — Z09 Encounter for follow-up examination after completed treatment for conditions other than malignant neoplasm: Secondary | ICD-10-CM

## 2020-11-17 LAB — I-STAT VENOUS BLOOD GAS, ED
Acid-Base Excess: 6 mmol/L — ABNORMAL HIGH (ref 0.0–2.0)
Bicarbonate: 32.8 mmol/L — ABNORMAL HIGH (ref 20.0–28.0)
Calcium, Ion: 1.03 mmol/L — ABNORMAL LOW (ref 1.15–1.40)
HCT: 25 % — ABNORMAL LOW (ref 36.0–46.0)
Hemoglobin: 8.5 g/dL — ABNORMAL LOW (ref 12.0–15.0)
O2 Saturation: 95 %
Potassium: 4.6 mmol/L (ref 3.5–5.1)
Sodium: 133 mmol/L — ABNORMAL LOW (ref 135–145)
TCO2: 35 mmol/L — ABNORMAL HIGH (ref 22–32)
pCO2, Ven: 59.6 mmHg (ref 44.0–60.0)
pH, Ven: 7.348 (ref 7.250–7.430)
pO2, Ven: 79 mmHg — ABNORMAL HIGH (ref 32.0–45.0)

## 2020-11-17 LAB — CBC WITH DIFFERENTIAL/PLATELET
Abs Immature Granulocytes: 0.08 10*3/uL — ABNORMAL HIGH (ref 0.00–0.07)
Basophils Absolute: 0 10*3/uL (ref 0.0–0.1)
Basophils Relative: 0 %
Eosinophils Absolute: 0.2 10*3/uL (ref 0.0–0.5)
Eosinophils Relative: 2 %
HCT: 27.3 % — ABNORMAL LOW (ref 36.0–46.0)
Hemoglobin: 8.4 g/dL — ABNORMAL LOW (ref 12.0–15.0)
Immature Granulocytes: 1 %
Lymphocytes Relative: 10 %
Lymphs Abs: 0.8 10*3/uL (ref 0.7–4.0)
MCH: 31.2 pg (ref 26.0–34.0)
MCHC: 30.8 g/dL (ref 30.0–36.0)
MCV: 101.5 fL — ABNORMAL HIGH (ref 80.0–100.0)
Monocytes Absolute: 0.4 10*3/uL (ref 0.1–1.0)
Monocytes Relative: 5 %
Neutro Abs: 6.7 10*3/uL (ref 1.7–7.7)
Neutrophils Relative %: 82 %
Platelets: 264 10*3/uL (ref 150–400)
RBC: 2.69 MIL/uL — ABNORMAL LOW (ref 3.87–5.11)
RDW: 14 % (ref 11.5–15.5)
WBC: 8.3 10*3/uL (ref 4.0–10.5)
nRBC: 0 % (ref 0.0–0.2)

## 2020-11-17 LAB — COMPREHENSIVE METABOLIC PANEL
ALT: 14 U/L (ref 0–44)
AST: 14 U/L — ABNORMAL LOW (ref 15–41)
Albumin: 2.6 g/dL — ABNORMAL LOW (ref 3.5–5.0)
Alkaline Phosphatase: 144 U/L — ABNORMAL HIGH (ref 38–126)
Anion gap: 10 (ref 5–15)
BUN: 27 mg/dL — ABNORMAL HIGH (ref 6–20)
CO2: 28 mmol/L (ref 22–32)
Calcium: 7.8 mg/dL — ABNORMAL LOW (ref 8.9–10.3)
Chloride: 97 mmol/L — ABNORMAL LOW (ref 98–111)
Creatinine, Ser: 5.23 mg/dL — ABNORMAL HIGH (ref 0.44–1.00)
GFR, Estimated: 9 mL/min — ABNORMAL LOW (ref >60)
Glucose, Bld: 310 mg/dL — ABNORMAL HIGH (ref 70–99)
Potassium: 4.7 mmol/L (ref 3.5–5.1)
Sodium: 135 mmol/L (ref 135–145)
Total Bilirubin: 0.7 mg/dL (ref 0.3–1.2)
Total Protein: 7.3 g/dL (ref 6.5–8.1)

## 2020-11-17 LAB — CBG MONITORING, ED: Glucose-Capillary: 259 mg/dL — ABNORMAL HIGH (ref 70–99)

## 2020-11-17 MED ORDER — ROCURONIUM BROMIDE 10 MG/ML (PF) SYRINGE
PREFILLED_SYRINGE | INTRAVENOUS | Status: AC
  Administered 2020-11-17: 100 mg via INTRAVENOUS
  Filled 2020-11-17: qty 10

## 2020-11-17 MED ORDER — LORAZEPAM 2 MG/ML IJ SOLN
2.0000 mg | INTRAMUSCULAR | Status: DC | PRN
  Administered 2020-11-17 (×3): 2 mg via INTRAVENOUS
  Filled 2020-11-17 (×3): qty 1

## 2020-11-17 MED ORDER — ROCURONIUM BROMIDE 10 MG/ML (PF) SYRINGE
PREFILLED_SYRINGE | INTRAVENOUS | Status: AC
  Filled 2020-11-17: qty 10

## 2020-11-17 MED ORDER — KETAMINE HCL 50 MG/5ML IJ SOSY
50.0000 mg | PREFILLED_SYRINGE | Freq: Once | INTRAMUSCULAR | Status: AC
  Administered 2020-11-17: 50 mg via INTRAVENOUS

## 2020-11-17 MED ORDER — ONDANSETRON HCL 4 MG/2ML IJ SOLN
4.0000 mg | Freq: Once | INTRAMUSCULAR | Status: AC
  Administered 2020-11-17: 4 mg via INTRAVENOUS
  Filled 2020-11-17: qty 2

## 2020-11-17 MED ORDER — MIDAZOLAM HCL 2 MG/2ML IJ SOLN
INTRAMUSCULAR | Status: AC
  Filled 2020-11-17: qty 2

## 2020-11-17 MED ORDER — SODIUM CHLORIDE 0.9 % IV BOLUS
500.0000 mL | Freq: Once | INTRAVENOUS | Status: AC
  Administered 2020-11-17: 500 mL via INTRAVENOUS

## 2020-11-17 MED ORDER — HEPARIN SODIUM (PORCINE) 5000 UNIT/ML IJ SOLN
5000.0000 [IU] | Freq: Three times a day (TID) | INTRAMUSCULAR | Status: DC
  Administered 2020-11-18 – 2020-11-27 (×25): 5000 [IU] via SUBCUTANEOUS
  Filled 2020-11-17 (×25): qty 1

## 2020-11-17 MED ORDER — NOREPINEPHRINE 4 MG/250ML-% IV SOLN
INTRAVENOUS | Status: AC
  Administered 2020-11-17: 2 ug/min via INTRAVENOUS
  Filled 2020-11-17: qty 250

## 2020-11-17 MED ORDER — HYDROMORPHONE HCL 1 MG/ML IJ SOLN
2.0000 mg | Freq: Once | INTRAMUSCULAR | Status: DC
  Filled 2020-11-17: qty 2

## 2020-11-17 MED ORDER — SUCCINYLCHOLINE CHLORIDE 200 MG/10ML IV SOSY
PREFILLED_SYRINGE | INTRAVENOUS | Status: AC
  Administered 2020-11-17: 125 mg via INTRAVENOUS
  Filled 2020-11-17: qty 10

## 2020-11-17 MED ORDER — ROCURONIUM BROMIDE 50 MG/5ML IV SOLN
100.0000 mg | Freq: Once | INTRAVENOUS | Status: AC
  Filled 2020-11-17: qty 10

## 2020-11-17 MED ORDER — FAMOTIDINE IN NACL 20-0.9 MG/50ML-% IV SOLN
20.0000 mg | INTRAVENOUS | Status: DC
  Administered 2020-11-18 (×2): 20 mg via INTRAVENOUS
  Filled 2020-11-17 (×3): qty 50

## 2020-11-17 MED ORDER — SODIUM CHLORIDE 0.9 % IV SOLN
1.0000 g | INTRAVENOUS | Status: AC
  Administered 2020-11-18 – 2020-11-22 (×5): 1 g via INTRAVENOUS
  Filled 2020-11-17 (×5): qty 10

## 2020-11-17 MED ORDER — HYDROMORPHONE HCL 1 MG/ML IJ SOLN
2.0000 mg | Freq: Once | INTRAMUSCULAR | Status: AC
  Administered 2020-11-17: 2 mg via INTRAVENOUS

## 2020-11-17 MED ORDER — SUCCINYLCHOLINE CHLORIDE 200 MG/10ML IV SOSY: 125.0000 mg | PREFILLED_SYRINGE | Freq: Once | INTRAVENOUS | Status: AC

## 2020-11-17 MED ORDER — FENTANYL CITRATE PF 50 MCG/ML IJ SOSY
100.0000 ug | PREFILLED_SYRINGE | INTRAMUSCULAR | Status: DC | PRN
Start: 2020-11-17 — End: 2020-11-18
  Administered 2020-11-17 (×3): 100 ug via INTRAVENOUS
  Filled 2020-11-17 (×3): qty 2

## 2020-11-17 MED ORDER — KETAMINE HCL 50 MG/5ML IJ SOSY
PREFILLED_SYRINGE | INTRAMUSCULAR | Status: AC
  Filled 2020-11-17: qty 5

## 2020-11-17 MED ORDER — SODIUM CHLORIDE 0.9 % IV SOLN
250.0000 mL | INTRAVENOUS | Status: DC
  Administered 2020-11-18: 250 mL via INTRAVENOUS

## 2020-11-17 MED ORDER — DOCUSATE SODIUM 100 MG PO CAPS
100.0000 mg | ORAL_CAPSULE | Freq: Two times a day (BID) | ORAL | Status: DC | PRN
  Filled 2020-11-17: qty 1

## 2020-11-17 MED ORDER — POLYETHYLENE GLYCOL 3350 17 G PO PACK: 17.0000 g | PACK | Freq: Every day | ORAL | Status: DC | PRN

## 2020-11-17 MED ORDER — SODIUM CHLORIDE 0.9 % IV SOLN
100.0000 mg | Freq: Two times a day (BID) | INTRAVENOUS | Status: AC
  Administered 2020-11-18 – 2020-11-22 (×10): 100 mg via INTRAVENOUS
  Filled 2020-11-17 (×12): qty 100

## 2020-11-17 MED ORDER — NALOXONE HCL 2 MG/2ML IJ SOSY
PREFILLED_SYRINGE | INTRAMUSCULAR | Status: AC
  Administered 2020-11-17: 1 mg via INTRAVENOUS
  Filled 2020-11-17: qty 2

## 2020-11-17 MED ORDER — MIDAZOLAM-SODIUM CHLORIDE 100-0.9 MG/100ML-% IV SOLN: 0.5000 mg/h | INTRAVENOUS | Status: DC

## 2020-11-17 MED ORDER — ETOMIDATE 2 MG/ML IV SOLN: 20.0000 mg | Freq: Once | INTRAVENOUS | Status: AC

## 2020-11-17 MED ORDER — FENTANYL 2500MCG IN NS 250ML (10MCG/ML) PREMIX INFUSION
0.0000 ug/h | INTRAVENOUS | Status: DC
  Administered 2020-11-17: 100 ug/h via INTRAVENOUS
  Filled 2020-11-17: qty 250

## 2020-11-17 MED ORDER — NALOXONE HCL 2 MG/2ML IJ SOSY: 1.0000 mg | PREFILLED_SYRINGE | Freq: Once | INTRAMUSCULAR | Status: AC

## 2020-11-17 MED ORDER — NOREPINEPHRINE 4 MG/250ML-% IV SOLN
2.0000 ug/min | INTRAVENOUS | Status: DC
  Administered 2020-11-17: 2 ug/min via INTRAVENOUS

## 2020-11-17 MED ORDER — ETOMIDATE 2 MG/ML IV SOLN
INTRAVENOUS | Status: AC
  Administered 2020-11-17: 20 mg via INTRAVENOUS
  Filled 2020-11-17: qty 20

## 2020-11-17 MED ORDER — LORAZEPAM 2 MG/ML IJ SOLN: 2.0000 mg | Freq: Once | INTRAMUSCULAR | Status: DC

## 2020-11-17 MED ORDER — FENTANYL CITRATE PF 50 MCG/ML IJ SOSY
PREFILLED_SYRINGE | INTRAMUSCULAR | Status: AC
  Filled 2020-11-17: qty 2

## 2020-11-17 NOTE — ED Notes (Signed)
Pt states she last took her oxycodone last PM. Pt's pain 10/10. Marland Kitchen

## 2020-11-17 NOTE — H&P (Addendum)
NAME:  [name], MRN:  [id number], DOB:  1965-08-24, LOS: 0 ADMISSION DATE:  11/17/2020, CONSULTATION DATE:  11/17/20 REFERRING MD:  , CHIEF COMPLAINT:  encephalopathy  History of Present Illness:  [name] is a 55 year old woman with CKD on HD who presented with R knee pain after a fall on 10/14, was in ED since 1040 am. .  Was unable to complete HD today due to pain.  Last oxycodone 10/16 pm.   Yesterday had knee pain but was able to extend the knee.  In the ED she was given dilaudid 78m at 1245 . Later this afternoon, developed period of apnea, low blood pressure and hypoxia.   Did not respond to narcan.  Was intubated for airway protection.  Remained obtunded.    Takes oxycodone 3110mdaily for chronic pain  IN ED: dilaudid 53m67m245, zofran 4mg16m 7.348/59.6/79 (2000) AP 144  Ca 7.8 alb 2.6  D dimer  2.62 Covid pending Blood culture pending.  Procal pending.  Pertinent  Medical History  CAD CHF Covid 19 hx  CKD on HD  HLD HTN MR (severe) Thoracic aortic aneurism DM2  PVD  OSA (not on cpap)  Home meds: albuterol, auryxia, calcitriol, insulin, hydroxyzine 25mg4mly, insulin, roxabin, metoprolol, narcan prn, oxycodone 10mg 79m mirapex, prednisone, crestor,   Significant Hospital Events: Including procedures, antibiotic start and stop dates in addition to other pertinent events     Interim History / Subjective:    Objective   Blood pressure 113/60, pulse 87, temperature 98.1 F (36.7 C), temperature source Oral, resp. rate 14, SpO2 100 %.    Vent Mode: PRVC FiO2 (%):  [50 %-100 %] 50 % Set Rate:  [14 bmp] 14 bmp Vt Set:  [480 mL-510 mL] 510 mL PEEP:  [5 cmH20] 5 cmH20 Plateau Pressure:  [15 cmH20-26 cmH20] 26 cmH20   Intake/Output Summary (Last 24 hours) at 11/17/2020 2104 Last data filed at 11/17/2020 2021 Gross per 24 hour  Intake 1522.32 ml  Output --  Net 1522.32 ml   There were no vitals filed for this visit.  Examination: General:  intermittent agitation followed by periods of unresponsiveness, not following commands  HENT: Clear frothy material in ET tube, easily suctioned  Lungs: B crackles  Cardiovascular: rrr no mgr Abdomen: nt, nd, nbs  Extremities:  Neuro: as above, moving extremities, not followin commands,  intermittently completely obtunded.    CXR IMPRESSION: 1. Endotracheal tube tip about 3 cm superior to carina 2. Cardiomegaly with vascular congestion and probable pulmonary edema. 3. More focal areas of consolidation and ground-glass opacity at the right apex and both bases suspicious for superimposed pneumonia 4. Enlarged mediastinal silhouette, likely augmented by portable technique and lordotic view.  CT head  IMPRESSION: Chronic appearing white matter insults affecting both cerebral hemispheres which could be due to chronic small vessel disease and or demyelinating disease. Given the vascular risk factors, this may simply represent atherosclerotic vascular disease.   No sign of acute CT abnormality.   Resolved Hospital Problem list     Assessment & Plan:  Acute encephalopathy: Possibly 2/2 narcotic use, however not behaving typically.  Did not respond to narcan.  Only received dilaudid at 1245, and did not improve after prolonged period.   CXR shows multifocal opacities.  VBG with hypercarbia, compensated (after intubation).  Concern for possible respiratory cause partially contributing.  Covid pending.  No clear hx of pneumonia, no leukocytosis, no fever.    Covid  pending Blood culture pending.  Procal pending.  UA pending. Antibiotics for CAP given CXR findings for now.   Hypoxemia: was associated with apneic episodes. Recheck ABG now.  On fio2 50%.   CKD  R knee pain Clarify which side pain was on, says R in notes, but L side was xrayed.    Hypotension: now resolved. Check cortisol.    Best Practice (right click and "Reselect all SmartList Selections" daily)   Diet/type:  NPO DVT prophylaxis: prophylactic heparin  GI prophylaxis: N/A Lines: N/A Foley:  N/A Code Status:  full code Last date of multidisciplinary goals of care discussion []   Labs   CBC: Recent Labs  Lab 11/12/20 1517 11/17/20 1700 11/17/20 2005  WBC 8.6 8.3  --   NEUTROABS 6.7 6.7  --   HGB 9.6* 8.4* 8.5*  HCT 30.1* 27.3* 25.0*  MCV 98.4 101.5*  --   PLT 240 264  --     Basic Metabolic Panel: Recent Labs  Lab 11/12/20 1517 11/17/20 1700 11/17/20 2005  NA 133* 135 133*  K 4.8 4.7 4.6  CL 91* 97*  --   CO2 24 28  --   GLUCOSE 226* 310*  --   BUN 47* 27*  --   CREATININE 8.14* 5.23*  --   CALCIUM 9.2 7.8*  --    GFR: Estimated Creatinine Clearance: 15.7 mL/min (A) (by C-G formula based on SCr of 5.23 mg/dL (H)). Recent Labs  Lab 11/12/20 1517 11/17/20 1700  WBC 8.6 8.3    Liver Function Tests: Recent Labs  Lab 11/12/20 1517 11/17/20 1700  AST 14* 14*  ALT 13 14  ALKPHOS 149* 144*  BILITOT 0.8 0.7  PROT 8.5* 7.3  ALBUMIN 3.0* 2.6*   No results for input(s): LIPASE, AMYLASE in the last 168 hours. No results for input(s): AMMONIA in the last 168 hours.  ABG    Component Value Date/Time   PHART 7.367 02/18/2020 1237   PCO2ART 51.9 (H) 02/18/2020 1237   PO2ART 60 (L) 02/18/2020 1237   HCO3 32.8 (H) 11/17/2020 2005   TCO2 35 (H) 11/17/2020 2005   O2SAT 95.0 11/17/2020 2005     Coagulation Profile: No results for input(s): INR, PROTIME in the last 168 hours.  Cardiac Enzymes: No results for input(s): CKTOTAL, CKMB, CKMBINDEX, TROPONINI in the last 168 hours.  HbA1C: Hemoglobin A1C  Date/Time Value Ref Range Status  05/01/2020 08:28 AM 9.7 (A) 4.0 - 5.6 % Final   Hgb A1c MFr Bld  Date/Time Value Ref Range Status  02/18/2020 07:06 PM 8.5 (H) 4.8 - 5.6 % Final    Comment:    (NOTE) Pre diabetes:          5.7%-6.4%  Diabetes:              >6.4%  Glycemic control for   <7.0% adults with diabetes   09/19/2018 08:33 AM 8.7 (H) 4.8 - 5.6 %  Final    Comment:    (NOTE) Pre diabetes:          5.7%-6.4% Diabetes:              >6.4% Glycemic control for   <7.0% adults with diabetes     CBG: Recent Labs  Lab 11/17/20 1554  GLUCAP 259*    Review of Systems:     Past Medical History:  She,  has a past medical history of Anemia, Anxiety, Arthritis, CAD (coronary artery disease), CHF (congestive heart failure) ([place]), COVID-19 (10/2018), Dyspnea,  ESRD (end stage renal disease) ([place]), Headache(784.0), Heart murmur, High cholesterol, History of blood transfusion, Hypertension, Mitral regurgitation, Nerve pain, Pericardial effusion, Pneumonia, PVD (peripheral vascular disease) ([place]), Restless legs, Sleep apnea, Thoracic ascending aortic aneurysm, and Type II diabetes mellitus ([place]).   Surgical History:   Past Surgical History:  Procedure Laterality Date   AMPUTATION Left 12/27/2018   Procedure: LEFT THUMB REVISION AMPUTATION DIGIT;  Surgeon: Charlotte Crumb, MD;  Location: [facility];  Service: Orthopedics;  Laterality: Left;   AV FISTULA PLACEMENT Left    AV FISTULA PLACEMENT Right 03/12/2019   Procedure: INSERTION OF ARTERIOVENOUS (AV) GORE-TEX GRAFT ARM;  Surgeon: Elam Dutch, MD;  Location: [facility];  Service: Vascular;  Laterality: Right;   AV FISTULA PLACEMENT Right 08/13/2019   Procedure: RIGHT UPPER ARM ARTERIOVENOUS (AV) FISTULA CREATION WITH BASILIC VEIN;  Surgeon: Elam Dutch, MD;  Location: [facility];  Service: Vascular;  Laterality: Right;   [place]; 1997   x 2   COLONOSCOPY     EYE SURGERY Bilateral    cataract surgery   INSERTION OF DIALYSIS CATHETER Right 09/26/2018   Procedure: INSERTION OF DIALYSIS CATHETER Right Internal Jugular.;  Surgeon: Elam Dutch, MD;  Location: [facility];  Service: Vascular;  Laterality: Right;   IR FLUORO GUIDE CV LINE RIGHT  05/04/2019   IR US GUIDE VASC ACCESS RIGHT  05/04/2019   LIGATION OF ARTERIOVENOUS  FISTULA Left 09/26/2018   Procedure: LIGATION OF  ARTERIOVENOUS  FISTULA LEFT ARM;  Surgeon: Elam Dutch, MD;  Location: [facility];  Service: Vascular;  Laterality: Left;   PERICARDIAL WINDOW  02/2004   for pericardial effusion   PERIPHERAL VASCULAR INTERVENTION Right 10/26/2019   Procedure: PERIPHERAL VASCULAR INTERVENTION;  Surgeon: Elam Dutch, MD;  Location: [facility];  Service: Cardiovascular;  Laterality: Right;  arm  AV fistula   TUBAL LIGATION  05/1995   UPPER EXTREMITY VENOGRAPHY N/A 06/22/2019   Procedure: UPPER EXTREMITY VENOGRAPHY - Right Central;  Surgeon: Elam Dutch, MD;  Location: [facility];  Service: Cardiovascular;  Laterality: N/A;     Social History:   reports that she quit smoking about 16 months ago. Her smoking use included cigarettes. She has never used smokeless tobacco. She reports that she does not drink alcohol and does not use drugs.   Family History:  Her family history includes Cancer in her brother; Dementia in her mother; Diabetes in her brother, father, mother, and sister; Heart disease in her father; Hyperlipidemia in her father; Hypertension in her brother, father, mother, and sister; Stroke in her father and mother.   Allergies Allergies  Allergen Reactions   Bupropion Itching     Home Medications  Prior to Admission medications   Medication Sig Start Date End Date Taking? Authorizing Provider  albuterol (VENTOLIN HFA) 108 (90 Base) MCG/ACT inhaler Inhale 2 puffs into the lungs every 6 (six) hours as needed for wheezing or shortness of breath. 02/22/20   Thurnell Lose, MD  AURYXIA 1 GM 210 MG(Fe) tablet Take 630-1,050 mg by mouth See admin instructions. Take 5 tablets (1050 mg) by mouth with each meal & take 3 tablets (630 mg) by mouth with each snack 05/29/18   [provider]  b complex-vitamin c-folic acid (NEPHRO-VITE) 0.8 MG TABS tablet Take 1 tablet by mouth every Monday, Wednesday, and Friday with hemodialysis. 04/12/19   [provider]   calcitRIOL (ROCALTROL) 0.5 MCG capsule Take 0.5 mcg by mouth  as directed. Given at Dialysis    [provider]  carbamide peroxide (DEBROX) 6.5 % OTIC solution Place 5 drops into both ears 2 (two) times daily. 10/01/20   Domenic Moras, PA-C  glucose blood (ONETOUCH VERIO) test strip 1 each by Other route 2 (two) times daily. And lancets 2/day 04/10/20   Renato Shin, MD  HUMIRA PEN 40 MG/0.4ML PNKT SMARTSIG:40 Milligram(s) SUB-Q Every 2 Weeks 08/22/20   [provider]  hydrOXYzine (ATARAX/VISTARIL) 25 MG tablet Take 25 mg by mouth daily.    [provider]  Insulin Lispro Prot & Lispro (HUMALOG 75/25 MIX) (75-25) 100 UNIT/ML Kwikpen Inject 35 Units into the skin 2 (two) times daily with a meal. 05/01/20   Renato Shin, MD  lidocaine (LIDODERM) 5 % Place 2 patches onto the skin daily. Remove & Discard patch within 12 hours or as directed by MD 11/12/20   Redwine, Madison A, PA-C  methocarbamol (ROBAXIN) 500 MG tablet Take 1 tablet (500 mg total) by mouth every 8 (eight) hours as needed for muscle spasms. 04/25/20   Lucrezia Starch, MD  Methoxy PEG-Epoetin Beta (MIRCERA IJ) Mircera 09/01/19   [provider]  metoprolol succinate (TOPROL-XL) 100 MG 24 hr tablet Take 100 mg by mouth daily. Take with or immediately following a meal.    [provider]  mupirocin ointment (BACTROBAN) 2 % Apply 1 application topically 2 (two) times daily. 11/06/20   McDonald, Stephan Minister, DPM  NARCAN 4 MG/0.1ML LIQD nasal spray kit Place 1 spray into the nose as needed (accidental overdose.).  10/10/19   [provider]  Oxycodone HCl 10 MG TABS Take 10 mg by mouth 3 (three) times daily. 02/08/20   [provider]  pramipexole (MIRAPEX) 0.5 MG tablet Take 0.5 mg by mouth daily.  05/18/17   [provider]  predniSONE (DELTASONE) 10 MG tablet TAKE 4 TABLETS (40 MG TOTAL) BY MOUTH DAILY WITH BREAKFAST FOR 4 DAYS. 02/28/20 02/27/21  Donne Hazel, MD  rosuvastatin  (CRESTOR) 40 MG tablet TAKE 1 TABLET BY MOUTH DAILY *PATIENT NEEDS APPOINTMENT* 11/13/20   Minus Breeding, MD     Critical care time: 60 min

## 2020-11-17 NOTE — ED Provider Notes (Signed)
[facility] EMERGENCY DEPARTMENT Provider Note   CSN: [id number] Arrival date & time: 11/17/20  1042     History Chief Complaint  Patient presents with   Leg Pain    [name] is a 55 y.o. female.  HPI She presents for evaluation of severe right knee pain, worsening since fall 3 days ago, unable to complete full dialysis today.  She completed all about 44 minutes.  She has chronic pain and typically takes 30 mg of oxycodone daily.  She states she does not take any narcotic prior to dialysis.  She is unable to give any additional history because of severe pain.  Level 5 caveat-severe pain    Past Medical History:  Diagnosis Date   Anemia    Anxiety    Arthritis    "knees" "hands", "RA"   CAD (coronary artery disease)    Nonobstructive on CT 2019   CHF (congestive heart failure) ([place])    COVID-19 10/2018   Dyspnea    "when I have too much fluid."   ESRD (end stage renal disease) ([place])    Dialysis [facility]- [address]   Headache(784.0)    Heart murmur    High cholesterol    History of blood transfusion    Hypertension    Mitral regurgitation    moderate to severe MR 10/2018 echo   Nerve pain    "they say I have L5 nerve damage; my lumbar"   Pericardial effusion    Pneumonia    ; 11/16/2018- "touch of pneumonia" - seen in ED- 11/14/2018- on antibiotic. Has had it x 2   PVD (peripheral vascular disease) ([place])    Right leg stent in [place].  (No records)   Restless legs    Sleep apnea    does not use Cpap   Thoracic ascending aortic aneurysm    4.4 cm 11/14/18 CTA   Type II diabetes mellitus [facility])     Patient Active Problem List   Diagnosis Date Noted   Thyroid nodule 04/10/2020   Acute respiratory failure ([place]) 02/26/2020   Chronic pain 03/20/2019   Preop cardiovascular exam 12/19/2018   Dyslipidemia 11/20/2018   Shortness of breath 10/23/2018   Acute respiratory distress 10/23/2018   Chronic bilateral pleural effusions 10/23/2018    Obesity 10/23/2018   Coronary artery disease 10/23/2018   Tobacco abuse 10/23/2018   Volume overload 10/23/2018   Unspecified protein-calorie malnutrition ([place]) 10/11/2018   Pneumonia 10/10/2018   COVID-19 virus detected 10/10/2018   Acute encephalopathy 10/09/2018   End-stage renal disease on hemodialysis ([place]) 09/27/2018   Acute respiratory failure with hypoxia ([place]) 09/26/2018   Chronic pain disorder 09/26/2018   Other encephalopathy 09/26/2018   COVID-19 virus infection 40/98/1191   Acute metabolic encephalopathy 47/82/9562   Gangrene ([place]) 09/18/2018   Allergy, unspecified, initial encounter 09/04/2018   Anaphylactic shock, unspecified, initial encounter 09/04/2018   Left arm pain 06/27/2018   Educated about COVID-19 virus infection 06/27/2018   Iron deficiency anemia, unspecified 01/10/2018   Coagulation defect, unspecified ([place]) 12/06/2017   Diarrhea, unspecified 10/11/2017   Anemia 09/26/2017   Left hip pain 09/26/2017   Neck pain 09/26/2017   Leukocytosis 09/26/2017   Pruritus, unspecified 09/22/2017   Secondary hyperparathyroidism of renal origin ([place]) 09/01/2017   Disorder of phosphorus metabolism, unspecified 08/25/2017   Anemia in chronic kidney disease 08/24/2017   Chest pain 07/07/2017   Left ventricular hypertrophy 06/17/2017   ESRD on dialysis [facility]) 05/21/2011   Diabetic retinopathy 05/21/2011  Diastolic dysfunction 24/23/5361   Hyperlipidemia 01/30/2011   Type II diabetes mellitus with complication, uncontrolled ([place]) 01/29/2011   Neuropathy 01/29/2011   Hypertension     Past Surgical History:  Procedure Laterality Date   AMPUTATION Left 12/27/2018   Procedure: LEFT THUMB REVISION AMPUTATION DIGIT;  Surgeon: Charlotte Crumb, MD;  Location: [place];  Service: Orthopedics;  Laterality: Left;   AV FISTULA PLACEMENT Left    AV FISTULA PLACEMENT Right 03/12/2019   Procedure: INSERTION OF ARTERIOVENOUS (AV) GORE-TEX GRAFT ARM;  Surgeon: Elam Dutch,  MD;  Location: [place];  Service: Vascular;  Laterality: Right;   AV FISTULA PLACEMENT Right 08/13/2019   Procedure: RIGHT UPPER ARM ARTERIOVENOUS (AV) FISTULA CREATION WITH BASILIC VEIN;  Surgeon: Elam Dutch, MD;  Location: [place];  Service: Vascular;  Laterality: Right;   [place]; 1997   x 2   COLONOSCOPY     EYE SURGERY Bilateral    cataract surgery   INSERTION OF DIALYSIS CATHETER Right 09/26/2018   Procedure: INSERTION OF DIALYSIS CATHETER Right Internal Jugular.;  Surgeon: Elam Dutch, MD;  Location: [place];  Service: Vascular;  Laterality: Right;   IR FLUORO GUIDE CV LINE RIGHT  05/04/2019   IR US GUIDE VASC ACCESS RIGHT  05/04/2019   LIGATION OF ARTERIOVENOUS  FISTULA Left 09/26/2018   Procedure: LIGATION OF ARTERIOVENOUS  FISTULA LEFT ARM;  Surgeon: Elam Dutch, MD;  Location: [place];  Service: Vascular;  Laterality: Left;   PERICARDIAL WINDOW  02/2004   for pericardial effusion   PERIPHERAL VASCULAR INTERVENTION Right 10/26/2019   Procedure: PERIPHERAL VASCULAR INTERVENTION;  Surgeon: Elam Dutch, MD;  Location: [place] CV LAB;  Service: Cardiovascular;  Laterality: Right;  arm  AV fistula   TUBAL LIGATION  05/1995   UPPER EXTREMITY VENOGRAPHY N/A 06/22/2019   Procedure: UPPER EXTREMITY VENOGRAPHY - Right Central;  Surgeon: Elam Dutch, MD;  Location: [place] CV LAB;  Service: Cardiovascular;  Laterality: N/A;     OB History   No obstetric history on file.     Family History  Problem Relation Age of Onset   Cancer Brother    Heart disease Father        Died age 7   Diabetes Father    Hyperlipidemia Father    Hypertension Father    Stroke Father    Diabetes Mother    Hypertension Mother    Stroke Mother    Dementia Mother    Diabetes Sister    Diabetes Brother    Hypertension Sister    Hypertension Brother     Social History   Tobacco Use   Smoking status: Former    Years: 33.00    Types: Cigarettes    Quit date:  07/17/2019    Years since quitting: 1.3   Smokeless tobacco: Never   Tobacco comments:    using NIcotene gum, rare 1/2 cigarette  Vaping Use   Vaping Use: Never used  Substance Use Topics   Alcohol use: No   Drug use: No    Home Medications Prior to Admission medications   Medication Sig Start Date End Date Taking? Authorizing Provider  albuterol (VENTOLIN HFA) 108 (90 Base) MCG/ACT inhaler Inhale 2 puffs into the lungs every 6 (six) hours as needed for wheezing or shortness of breath. 02/22/20   Thurnell Lose, MD  AURYXIA 1 GM 210 MG(Fe) tablet Take 630-1,050 mg by mouth See admin instructions. Take 5 tablets (1050  mg) by mouth with each meal & take 3 tablets (630 mg) by mouth with each snack 05/29/18   [provider]  b complex-vitamin c-folic acid (NEPHRO-VITE) 0.8 MG TABS tablet Take 1 tablet by mouth every Monday, Wednesday, and Friday with hemodialysis. 04/12/19   [provider]  calcitRIOL (ROCALTROL) 0.5 MCG capsule Take 0.5 mcg by mouth as directed. Given at Dialysis    [provider]  carbamide peroxide (DEBROX) 6.5 % OTIC solution Place 5 drops into both ears 2 (two) times daily. 10/01/20   Domenic Moras, PA-C  glucose blood (ONETOUCH VERIO) test strip 1 each by Other route 2 (two) times daily. And lancets 2/day 04/10/20   Renato Shin, MD  HUMIRA PEN 40 MG/0.4ML PNKT SMARTSIG:40 Milligram(s) SUB-Q Every 2 Weeks 08/22/20   [provider]  hydrOXYzine (ATARAX/VISTARIL) 25 MG tablet Take 25 mg by mouth daily.    [provider]  Insulin Lispro Prot & Lispro (HUMALOG 75/25 MIX) (75-25) 100 UNIT/ML Kwikpen Inject 35 Units into the skin 2 (two) times daily with a meal. 05/01/20   Renato Shin, MD  lidocaine (LIDODERM) 5 % Place 2 patches onto the skin daily. Remove & Discard patch within 12 hours or as directed by MD 11/12/20   Redwine, Madison A, PA-C  methocarbamol (ROBAXIN) 500 MG tablet Take 1 tablet (500 mg total) by mouth every 8  (eight) hours as needed for muscle spasms. 04/25/20   Lucrezia Starch, MD  Methoxy PEG-Epoetin Beta (MIRCERA IJ) Mircera 09/01/19   [provider]  metoprolol succinate (TOPROL-XL) 100 MG 24 hr tablet Take 100 mg by mouth daily. Take with or immediately following a meal.    [provider]  mupirocin ointment (BACTROBAN) 2 % Apply 1 application topically 2 (two) times daily. 11/06/20   McDonald, Stephan Minister, DPM  NARCAN 4 MG/0.1ML LIQD nasal spray kit Place 1 spray into the nose as needed (accidental overdose.).  10/10/19   [provider]  Oxycodone HCl 10 MG TABS Take 10 mg by mouth 3 (three) times daily. 02/08/20   [provider]  pramipexole (MIRAPEX) 0.5 MG tablet Take 0.5 mg by mouth daily.  05/18/17   [provider]  predniSONE (DELTASONE) 10 MG tablet TAKE 4 TABLETS (40 MG TOTAL) BY MOUTH DAILY WITH BREAKFAST FOR 4 DAYS. 02/28/20 02/27/21  Donne Hazel, MD  rosuvastatin (CRESTOR) 40 MG tablet TAKE 1 TABLET BY MOUTH DAILY *PATIENT NEEDS APPOINTMENT* 11/13/20   Minus Breeding, MD    Allergies    Bupropion  Review of Systems   Review of Systems  Unable to perform ROS: Acuity of condition   Physical Exam Updated Vital Signs BP 136/68   Pulse 70   Temp 98.1 F (36.7 C) (Oral)   Resp 14   LMP  (LMP Unknown) Comment: LMP Feb 2011  SpO2 100%   Physical Exam Vitals and nursing note reviewed.  Constitutional:      General: She is in acute distress (Tearful, uncomfortable).     Appearance: She is well-developed. She is not ill-appearing, toxic-appearing or diaphoretic.  HENT:     Head: Normocephalic and atraumatic.     Right Ear: External ear normal.     Left Ear: External ear normal.  Eyes:     Conjunctiva/sclera: Conjunctivae normal.     Pupils: Pupils are equal, round, and reactive to light.  Neck:     Trachea: Phonation normal.  Cardiovascular:     Rate and Rhythm: Normal rate.  Pulmonary:     Effort: Pulmonary effort is normal.   Abdominal:     General: There is no distension.  Musculoskeletal:        General: Normal range of motion.     Cervical back: Normal range of motion and neck supple.     Comments: Right knee tender with slight swelling.  Rest of the right hip or right ankle.  No gross deformities.  Skin:    General: Skin is warm and dry.  Neurological:     Mental Status: She is alert and oriented to person, place, and time.     Cranial Nerves: No cranial nerve deficit.     Motor: No abnormal muscle tone.     Coordination: Coordination normal.  Psychiatric:        Behavior: Behavior normal.    ED Results / Procedures / Treatments   Labs (all labs ordered are listed, but only abnormal results are displayed) Labs Reviewed  COMPREHENSIVE METABOLIC PANEL - Abnormal; Notable for the following components:      Result Value   Chloride 97 (*)    Glucose, Bld 310 (*)    BUN 27 (*)    Creatinine, Ser 5.23 (*)    Calcium 7.8 (*)    Albumin 2.6 (*)    AST 14 (*)    Alkaline Phosphatase 144 (*)    GFR, Estimated 9 (*)    All other components within normal limits  CBC WITH DIFFERENTIAL/PLATELET - Abnormal; Notable for the following components:   RBC 2.69 (*)    Hemoglobin 8.4 (*)    HCT 27.3 (*)    MCV 101.5 (*)    Abs Immature Granulocytes 0.08 (*)    All other components within normal limits  CBG MONITORING, ED - Abnormal; Notable for the following components:   Glucose-Capillary 259 (*)    All other components within normal limits  RESP PANEL BY RT-PCR (FLU A&B, COVID) ARPGX2  BLOOD GAS, ARTERIAL  I-STAT VENOUS BLOOD GAS, ED    EKG None  Radiology DG Chest Port 1 View  Result Date: 11/17/2020 CLINICAL DATA:  Post intubation EXAM: PORTABLE CHEST 1 VIEW COMPARISON:  02/25/2020 FINDINGS: Endotracheal tube tip is about 3 cm superior to the carina. Esophageal tube tip below the diaphragm but incompletely visualized. Cardiomegaly with vascular congestion and probable pulmonary edema. More  confluent ground-glass opacity at the right apex and patchy consolidations at the bases. Widened mediastinal silhouette probably due to portable technique and lordotic positioning. IMPRESSION: 1. Endotracheal tube tip about 3 cm superior to carina 2. Cardiomegaly with vascular congestion and probable pulmonary edema. 3. More focal areas of consolidation and ground-glass opacity at the right apex and both bases suspicious for superimposed pneumonia 4. Enlarged mediastinal silhouette, likely augmented by portable technique and lordotic view. Electronically Signed   By: Donavan Foil M.D.   On: 11/17/2020 17:40   DG Knee Complete 4 Views Left  Result Date: 11/17/2020 CLINICAL DATA:  Right knee pain EXAM: LEFT KNEE - COMPLETE 4+ VIEW; DG HIP (WITH OR WITHOUT PELVIS) 2-3V RIGHT COMPARISON:  None. FINDINGS: No evidence of fracture or dislocation. Mild degenerative changes of the right hip. Mild-to-moderate degenerative changes of the bilateral SI joints, evaluation somewhat limited due to overlying bowel gas. Limited evaluation of the sacrum and left iliac wing. Calcification in the pelvis, likely calcified fibroid. Single AP view of the left hip demonstrates mild degenerative changes in no acute osseous abnormality. Vascular calcifications. No acute fracture or dislocation of  the left knee. Moderate left knee joint effusion. Moderate tricompartmental degenerative changes. Vascular calcifications. IMPRESSION: No acute osseous abnormality. Moderate left knee joint effusion. Electronically Signed   By: Yetta Glassman M.D.   On: 11/17/2020 14:12   DG Hip Unilat With Pelvis 2-3 Views Right  Result Date: 11/17/2020 CLINICAL DATA:  Right knee pain EXAM: LEFT KNEE - COMPLETE 4+ VIEW; DG HIP (WITH OR WITHOUT PELVIS) 2-3V RIGHT COMPARISON:  None. FINDINGS: No evidence of fracture or dislocation. Mild degenerative changes of the right hip. Mild-to-moderate degenerative changes of the bilateral SI joints, evaluation  somewhat limited due to overlying bowel gas. Limited evaluation of the sacrum and left iliac wing. Calcification in the pelvis, likely calcified fibroid. Single AP view of the left hip demonstrates mild degenerative changes in no acute osseous abnormality. Vascular calcifications. No acute fracture or dislocation of the left knee. Moderate left knee joint effusion. Moderate tricompartmental degenerative changes. Vascular calcifications. IMPRESSION: No acute osseous abnormality. Moderate left knee joint effusion. Electronically Signed   By: Yetta Glassman M.D.   On: 11/17/2020 14:12    Procedures Procedure Name: Intubation Date/Time: 11/17/2020 4:52 PM Performed by: Daleen Bo, MD Pre-anesthesia Checklist: Patient identified, Patient being monitored and Emergency Drugs available Oxygen Delivery Method: Nasal cannula Preoxygenation: Pre-oxygenation with 100% oxygen Induction Type: Rapid sequence Ventilation: Mask ventilation without difficulty Laryngoscope Size: Glidescope and 3 Grade View: Grade II Tube size: 7.5 mm Number of attempts: 2 Placement Confirmation: ETT inserted through vocal cords under direct vision, CO2 detector, Breath sounds checked- equal and bilateral and Positive ETCO2 Secured at: 23 cm Tube secured with: ETT holder Dental Injury: Teeth and Oropharynx as per pre-operative assessment     .Central Line  Date/Time: 11/17/2020 6:37 PM Performed by: Daleen Bo, MD Authorized by: Daleen Bo, MD   Consent:    Consent obtained:  Emergent situation   Risks discussed:  Arterial puncture and incorrect placement Universal protocol:    Immediately prior to procedure, a time out was called: yes     Patient identity confirmed:  Arm band Pre-procedure details:    Indication(s): central venous access     Hand hygiene: Hand hygiene performed prior to insertion     Sterile barrier technique: All elements of maximal sterile technique followed     Skin preparation:   Chlorhexidine   Skin preparation agent: Skin preparation agent completely dried prior to procedure   Sedation:    Sedation type:  None Anesthesia:    Anesthesia method:  None Procedure details:    Location:  R femoral   Patient position:  Supine   Procedural supplies:  Triple lumen   Catheter size:  11.5 Fr   Landmarks identified: yes     Ultrasound guidance: yes     Number of attempts:  2   Successful placement: yes   Post-procedure details:    Post-procedure:  Dressing applied and line sutured   Assessment:  Blood return through all ports   Procedure completion:  Tolerated well, no immediate complications .Critical Care Performed by: Daleen Bo, MD Authorized by: Daleen Bo, MD   Critical care provider statement:    Critical care time (minutes):  130   Critical care start time:  11/17/2020 11:55 AM   Critical care end time:  11/17/2020 7:26 PM   Critical care time was exclusive of:  Separately billable procedures and treating other patients   Critical care was necessary to treat or prevent imminent or life-threatening deterioration of the following conditions:  Respiratory  failure and circulatory failure   Critical care was time spent personally by me on the following activities:  Blood draw for specimens, development of treatment plan with patient or surrogate, discussions with consultants, evaluation of patient's response to treatment, examination of patient, ordering and performing treatments and interventions, ordering and review of laboratory studies, ordering and review of radiographic studies, pulse oximetry, re-evaluation of patient's condition and review of old charts   Medications Ordered in ED Medications  LORazepam (ATIVAN) injection 2 mg (0 mg Intramuscular Hold 11/17/20 1255)  fentaNYL (SUBLIMAZE) injection 100 mcg (100 mcg Intravenous Given 11/17/20 1839)  LORazepam (ATIVAN) injection 2 mg (2 mg Intravenous Given 11/17/20 1717)  0.9 %  sodium chloride  infusion (has no administration in time range)  norepinephrine (LEVOPHED) 64m in 2524mpremix infusion (2 mcg/min Intravenous New Bag/Given 11/17/20 1805)  HYDROmorphone (DILAUDID) injection 2 mg (2 mg Intravenous Given 11/17/20 1245)  ondansetron (ZOFRAN) injection 4 mg (4 mg Intravenous Given 11/17/20 1244)  naloxone (NARCAN) injection 1 mg (1 mg Intravenous Given 11/17/20 1610)  sodium chloride 0.9 % bolus 500 mL (0 mLs Intravenous Stopped 11/17/20 1721)  etomidate (AMIDATE) injection 20 mg (20 mg Intravenous Given 11/17/20 1646)  succinylcholine (ANECTINE) syringe 125 mg (125 mg Intravenous Given 11/17/20 1646)  sodium chloride 0.9 % bolus 500 mL (0 mLs Intravenous Stopped 11/17/20 1728)  sodium chloride 0.9 % bolus 500 mL ( Intravenous Stopped 11/17/20 1801)  rocuronium (ZEMURON) injection 100 mg (100 mg Intravenous Given 11/17/20 1744)  ketamine 50 mg in normal saline 5 mL (10 mg/mL) syringe (50 mg Intravenous Given 11/17/20 1747)    ED Course  I have reviewed the triage vital signs and the nursing notes.  Pertinent labs & imaging results that were available during my care of the patient were reviewed by me and considered in my medical decision making (see chart for details).  Clinical Course as of 11/17/20 1925  Mon Nov 17, 2020  1209 I was able to discuss the situation with the patient's sister who saw her yesterday at home.  She states then, the patient was complaining of right knee pain but was able to extend the right knee.  She was taking her usual medications at home.  Her sister states that the patient fell onto both of her knees, 3 days ago. [EW]  1427 She is sleeping soundly, with oxygenation 90% on cannula supplementation orally [EW]  1635 Patient has had periods of apnea, dropped blood pressure and oxygen saturation.  She was treated with IV Narcan without improvement in her mentation.  She did began breathing better and did not require additional support.  Previously staff  had administered some facemask assisted breathing.  At this time, patient's sister is with her and I have discussed with her the necessity for intubation.  We are currently waiting for her husband and son to arrive, prior to intubation at the request of the sister.  Patient stable now with normal blood pressure, normal pulse, normal oxygenation, normal heart rate.  Anticipate intubation to protect airway since she is comatose, while obtaining CT imaging. [EW]  1645 Patient's husband at bedside, understands that she requires emergent intubation for airway protection.  He is agreeable to this. [EW]  164580ust before intubation attempt, patient became irritable, moves all extremities, and reach toward her face.  She was observed for 2 minutes, but did not make purposeful response to verbal or physical stimulation.  Elected to proceed with intubation. [EW]    Clinical Course  User Index [EW] Daleen Bo, MD   MDM Rules/Calculators/A&P                            Patient Vitals for the past 24 hrs:  BP Temp Temp src Pulse Resp SpO2  11/17/20 1915 136/68 -- -- 70 14 100 %  11/17/20 1900 (!) 143/71 -- -- 76 14 100 %  11/17/20 1845 138/71 -- -- 77 14 100 %  11/17/20 1835 124/64 -- -- 77 12 100 %  11/17/20 1830 123/66 -- -- 75 14 100 %  11/17/20 1825 109/61 -- -- 74 15 100 %  11/17/20 1820 108/60 -- -- 74 14 100 %  11/17/20 1815 (!) 97/56 -- -- 75 16 100 %  11/17/20 1813 (!) 93/53 -- -- 76 14 100 %  11/17/20 1810 (!) 82/50 -- -- 75 14 100 %  11/17/20 1805 (!) 63/38 -- -- 76 14 100 %  11/17/20 1800 (!) 61/37 -- -- 75 14 100 %  11/17/20 1745 (!) 57/38 -- -- 80 14 100 %  11/17/20 1740 (!) 62/40 -- -- 81 14 100 %  11/17/20 1735 (!) 69/39 -- -- 82 14 100 %  11/17/20 1730 (!) 69/41 -- -- 88 (!) 24 100 %  11/17/20 1725 (!) 67/37 -- -- 83 14 100 %  11/17/20 1720 (!) 74/39 -- -- 86 16 100 %  11/17/20 1715 (!) 91/46 -- -- 100 15 100 %  11/17/20 1700 125/60 -- -- 98 18 100 %  11/17/20 1655 (!) 102/59  -- -- 95 14 100 %  11/17/20 1650 121/61 -- -- (!) 108 15 100 %  11/17/20 1640 (!) 102/53 -- -- 97 20 100 %  11/17/20 1635 (!) 96/51 -- -- 93 17 100 %  11/17/20 1630 (!) 102/51 -- -- 97 19 100 %  11/17/20 1625 (!) 87/48 -- -- 90 17 100 %  11/17/20 1615 (!) 112/52 -- -- 96 14 96 %  11/17/20 1610 (!) 86/43 -- -- 65 16 100 %  11/17/20 1602 (!) 50/20 -- -- -- -- (!) 86 %  11/17/20 1530 (!) 84/50 -- -- 88 15 91 %  11/17/20 1515 (!) 89/51 -- -- -- 15 --  11/17/20 1500 (!) 90/49 -- -- -- 15 --  11/17/20 1445 (!) 91/55 -- -- 86 15 90 %  11/17/20 1315 (!) 85/52 -- -- 86 12 91 %  11/17/20 1255 (!) 96/52 -- -- 86 18 91 %  11/17/20 1200 129/77 -- -- -- (!) 26 92 %  11/17/20 1100 136/61 -- -- -- (!) 27 --  11/17/20 1047 119/60 98.1 F (36.7 C) Oral 86 18 90 %     Medical Decision Making:  This patient is presenting for evaluation of bilateral knee pain, which does require a range of treatment options, and is a complaint that involves a moderate risk of morbidity and mortality. The differential diagnoses include fracture right knee. I decided to review old records, and in summary patient with fall 3 days ago, presenting after going to dialysis and complaining of severe right knee pain.  I obtained additional historical information from family members at bedside.  Clinical Laboratory Tests Ordered, included CBC and Metabolic panel. Review indicates normal except hemoglobin low, chloride low, glucose high, BUN high, creatinine high, calcium low, albumin low, AST low, alk phos stays high, GFR low. Radiologic Tests Ordered, included radiography of right knee, x-ray pelvis and right hip chest x-ray,  CT head.  I independently Visualized: Radiographic images, which show no fracture.  Post intubation chest x-ray does not show clear evidence for pneumonia, possible pneumonitis.  ET tube is appropriately placed.  Cardiac Monitor Tracing which shows normal sinus rhythm    Critical Interventions-clinical  evaluation, initial evaluation for right knee injury, subacute.  Patient treated with pain medicine and had subsequent decompensation with low blood pressure periods of apnea.  She required IV fluid boluses for hypotension, airway support initially with bag-valve-mask assist, followed by intubation for airway protection and support.  Persistent hypotension required placement of central line, right femoral access by me.  Patient's blood pressure improved with Levophed initially started peripherally then switched to the central line.  CT ordered to evaluate for causes of altered mental status.  Pending at time of disposition to next team.  After These Interventions, the Patient was reevaluated and was found with no clear cause for altered mental status, initially.  Possible oversedation with hydromorphone given for knee pain.  Patient does have chronic pain and takes 30 mg of oxycodone daily.  No complications from dialysis seen.  CRITICAL CARE-yes Performed by: Daleen Bo  Nursing Notes Reviewed/ Care Coordinated Applicable Imaging Reviewed Interpretation of Laboratory Data incorporated into ED treatment     Final Clinical Impression(s) / ED Diagnoses Final diagnoses:  Altered mental status, unspecified altered mental status type  Hypotension, unspecified hypotension type  Other chronic pain    Rx / DC Orders ED Discharge Orders     None        Daleen Bo, MD 11/17/20 1926

## 2020-11-17 NOTE — ED Notes (Signed)
Norepi moved to femoral line

## 2020-11-17 NOTE — ED Notes (Signed)
EDP talking with family at this time.

## 2020-11-17 NOTE — ED Notes (Signed)
Pt started having apneic periods and B/P 50/20 manual. Call light activated and additional staff and EDP to bedside. Began BVM to assist pt and SpO2 increased to 100%.

## 2020-11-17 NOTE — Progress Notes (Signed)
RT unable to get an ABG after 2 attempts. MD notified that we need a possible femoral stick.

## 2020-11-17 NOTE — ED Notes (Signed)
NaCl running WO at this time.

## 2020-11-17 NOTE — ED Notes (Signed)
Pt not responding to painful stimulation. Airway intact, resp 14, SpO2 91%. EDP aware. Will continue to monitor.

## 2020-11-17 NOTE — ED Notes (Signed)
EDP preparing to intubate. Family wanting to see patient prior to procedure. Family aware of urgency.

## 2020-11-17 NOTE — ED Notes (Signed)
Pt remains unresponsive. RR 16 with SpO2 91% on 4L via N/C. EDP still aware. Plan to allow medication to wear off and await for pt to become more arouseable.

## 2020-11-17 NOTE — ED Notes (Signed)
Resp increasing to 20 and pt 94% on 4L. Pt remains unresponsive despite narcan.

## 2020-11-17 NOTE — Progress Notes (Signed)
eLink Physician-Brief Progress Note Patient Name: [name] DOB: 1965-08-10 MRN: [id number]   Date of Service  11/17/2020  HPI/Events of Note  55/F with ESRD on HD, presenting with R knee pain.  She was unable to complete HD due to pain and has been in the ED 11/14/20.  She was given dilaudid in the ED but patient was noted to be apneic, hypotensive and hypoxic, unresponsive to narcan.  She was intubated for airway protection.    CT head - Chronic appearing white matter insults affecting both cerebral hemispheres which could be due to chronic small vessel disease and or demyelinating disease. Given the vascular risk factors, this may simply represent atherosclerotic vascular disease Venous pH 7.348 CXR suspicious for pneumonia, cardiomegaly  Pt was responding to pain and noxious stimuli nut   eICU Interventions  Encephalopathy Acute respiratory failure Possible pneumonia ESRD on HD Knee pain  Discontinue versed gtt. Pt is on fentanyl gtt now.  Add precedex gtt and titrate fentanyl as much as possible. Consider MRI if no improvement in mental status. Continue empiric antibiotics. Follow up cultures, covid, procalcitonin. Famotidine for GI prophylaxis.  Heparin for DVT prophylaxis.     Intervention Category Evaluation Type: New Patient Evaluation  Elsie Lincoln 11/17/2020, 11:39 PM

## 2020-11-17 NOTE — ED Notes (Signed)
ED Provider at bedside. 

## 2020-11-17 NOTE — Progress Notes (Signed)
RT and RN transported patient from ED to 3M07 without event.

## 2020-11-17 NOTE — ED Notes (Signed)
Pt trying to breath against the vent at this time and moving extremities.

## 2020-11-17 NOTE — ED Triage Notes (Signed)
Pt here via EMS from HD d/t overwhelming right knee pain. Pt could not finish the 60mins that remained d/t being in such pain. Clamp remains on pt.   88/50BP (baseline) HR 88 CBG 226 96% RA

## 2020-11-17 NOTE — ED Provider Notes (Signed)
Signout from Dr. Eulis Foster.  55 year old female end-stage renal disease at dialysis today but had to stop short due to knee pain.  Received IV pain medication and became less responsive and dropped her blood pressure.  Did not improve with Narcan.  Intubated for airway protection.  Currently has a central line and is on pressors.  Plan is to follow-up on head CT and secure admission. Physical Exam  BP (!) 165/72   Pulse 77   Temp 98.1 F (36.7 C) (Oral)   Resp 14   LMP  (LMP Unknown) Comment: LMP Feb 2011  SpO2 100%   Physical Exam  ED Course/Procedures   Clinical Course as of 11/17/20 2256  Mon Nov 17, 2020  1209 I was able to discuss the situation with the patient's sister who saw her yesterday at home.  She states then, the patient was complaining of right knee pain but was able to extend the right knee.  She was taking her usual medications at home.  Her sister states that the patient fell onto both of her knees, 3 days ago. [EW]  1427 She is sleeping soundly, with oxygenation 90% on cannula supplementation orally [EW]  1635 Patient has had periods of apnea, dropped blood pressure and oxygen saturation.  She was treated with IV Narcan without improvement in her mentation.  She did began breathing better and did not require additional support.  Previously staff had administered some facemask assisted breathing.  At this time, patient's sister is with her and I have discussed with her the necessity for intubation.  We are currently waiting for her husband and son to arrive, prior to intubation at the request of the sister.  Patient stable now with normal blood pressure, normal pulse, normal oxygenation, normal heart rate.  Anticipate intubation to protect airway since she is comatose, while obtaining CT imaging. [EW]  1645 Patient's husband at bedside, understands that she requires emergent intubation for airway protection.  He is agreeable to this. [EW]  0923 Just before intubation attempt,  patient became irritable, moves all extremities, and reach toward her face.  She was observed for 2 minutes, but did not make purposeful response to verbal or physical stimulation.  Elected to proceed with intubation. [EW]    Clinical Course User Index [EW] Daleen Bo, MD    .Critical Care Performed by: Hayden Rasmussen, MD Authorized by: Hayden Rasmussen, MD   Critical care provider statement:    Critical care time (minutes):  45   Critical care time was exclusive of:  Separately billable procedures and treating other patients   Critical care was time spent personally by me on the following activities:  Discussions with consultants, evaluation of patient's response to treatment, examination of patient, obtaining history from patient or surrogate, pulse oximetry and re-evaluation of patient's condition   Care discussed with: admitting provider    MDM  Discussed with nephrology Dr. Johnney Ou and critical care Dr. Lynetta Mare who will see in consult.  Was able to wean patient off pressors.  She remains with soft blood pressures but oxygenating well on the vent.  She was seen by Dr. Patsey Berthold critical care who is admitting to her service.       Hayden Rasmussen, MD 11/17/20 2258

## 2020-11-17 NOTE — Progress Notes (Signed)
Patient transported to CT and back to room 12 without event.  Patient suctioned prior to and after transport per VAP protocol.

## 2020-11-17 NOTE — ED Notes (Signed)
Pt hollering during triage, states she cant take the pain.

## 2020-11-17 NOTE — ED Notes (Signed)
Pt to CT with this RN on cardiac monitor and RT managing the vent.

## 2020-11-17 NOTE — ED Notes (Signed)
EDP at bedside to place central line

## 2020-11-17 NOTE — ED Notes (Signed)
Pt now starting to wake up with non-purposeful movement x 4 extremities prior to RSI.

## 2020-11-18 ENCOUNTER — Inpatient Hospital Stay (HOSPITAL_COMMUNITY): Payer: Medicare Other

## 2020-11-18 DIAGNOSIS — G9340 Encephalopathy, unspecified: Secondary | ICD-10-CM | POA: Diagnosis not present

## 2020-11-18 DIAGNOSIS — J690 Pneumonitis due to inhalation of food and vomit: Secondary | ICD-10-CM

## 2020-11-18 DIAGNOSIS — R4182 Altered mental status, unspecified: Secondary | ICD-10-CM

## 2020-11-18 LAB — POCT I-STAT 7, (LYTES, BLD GAS, ICA,H+H)
Acid-Base Excess: 5 mmol/L — ABNORMAL HIGH (ref 0.0–2.0)
Bicarbonate: 31.6 mmol/L — ABNORMAL HIGH (ref 20.0–28.0)
Calcium, Ion: 1.11 mmol/L — ABNORMAL LOW (ref 1.15–1.40)
HCT: 24 % — ABNORMAL LOW (ref 36.0–46.0)
Hemoglobin: 8.2 g/dL — ABNORMAL LOW (ref 12.0–15.0)
O2 Saturation: 93 %
Patient temperature: 100.1
Potassium: 4.6 mmol/L (ref 3.5–5.1)
Sodium: 136 mmol/L (ref 135–145)
TCO2: 33 mmol/L — ABNORMAL HIGH (ref 22–32)
pCO2 arterial: 61.1 mmHg — ABNORMAL HIGH (ref 32.0–48.0)
pH, Arterial: 7.326 — ABNORMAL LOW (ref 7.350–7.450)
pO2, Arterial: 76 mmHg — ABNORMAL LOW (ref 83.0–108.0)

## 2020-11-18 LAB — CBC
HCT: 23.7 % — ABNORMAL LOW (ref 36.0–46.0)
Hemoglobin: 7.4 g/dL — ABNORMAL LOW (ref 12.0–15.0)
MCH: 30.7 pg (ref 26.0–34.0)
MCHC: 31.2 g/dL (ref 30.0–36.0)
MCV: 98.3 fL (ref 80.0–100.0)
Platelets: 226 10*3/uL (ref 150–400)
RBC: 2.41 MIL/uL — ABNORMAL LOW (ref 3.87–5.11)
RDW: 14.1 % (ref 11.5–15.5)
WBC: 7.1 10*3/uL (ref 4.0–10.5)
nRBC: 0 % (ref 0.0–0.2)

## 2020-11-18 LAB — POCT I-STAT EG7
Acid-Base Excess: 6 mmol/L — ABNORMAL HIGH (ref 0.0–2.0)
Bicarbonate: 32.3 mmol/L — ABNORMAL HIGH (ref 20.0–28.0)
Calcium, Ion: 1.08 mmol/L — ABNORMAL LOW (ref 1.15–1.40)
HCT: 31 % — ABNORMAL LOW (ref 36.0–46.0)
Hemoglobin: 10.5 g/dL — ABNORMAL LOW (ref 12.0–15.0)
O2 Saturation: 58 %
Patient temperature: 37.7
Potassium: 4.3 mmol/L (ref 3.5–5.1)
Sodium: 136 mmol/L (ref 135–145)
TCO2: 34 mmol/L — ABNORMAL HIGH (ref 22–32)
pCO2, Ven: 58.1 mmHg (ref 44.0–60.0)
pH, Ven: 7.357 (ref 7.250–7.430)
pO2, Ven: 34 mmHg (ref 32.0–45.0)

## 2020-11-18 LAB — BASIC METABOLIC PANEL
Anion gap: 14 (ref 5–15)
BUN: 34 mg/dL — ABNORMAL HIGH (ref 6–20)
CO2: 28 mmol/L (ref 22–32)
Calcium: 8.2 mg/dL — ABNORMAL LOW (ref 8.9–10.3)
Chloride: 94 mmol/L — ABNORMAL LOW (ref 98–111)
Creatinine, Ser: 6.1 mg/dL — ABNORMAL HIGH (ref 0.44–1.00)
GFR, Estimated: 8 mL/min — ABNORMAL LOW (ref >60)
Glucose, Bld: 224 mg/dL — ABNORMAL HIGH (ref 70–99)
Potassium: 4.2 mmol/L (ref 3.5–5.1)
Sodium: 136 mmol/L (ref 135–145)

## 2020-11-18 LAB — TSH: TSH: 1.38 u[IU]/mL (ref 0.350–4.500)

## 2020-11-18 LAB — GLUCOSE, CAPILLARY
Glucose-Capillary: 107 mg/dL — ABNORMAL HIGH (ref 70–99)
Glucose-Capillary: 145 mg/dL — ABNORMAL HIGH (ref 70–99)
Glucose-Capillary: 145 mg/dL — ABNORMAL HIGH (ref 70–99)
Glucose-Capillary: 181 mg/dL — ABNORMAL HIGH (ref 70–99)
Glucose-Capillary: 210 mg/dL — ABNORMAL HIGH (ref 70–99)
Glucose-Capillary: 216 mg/dL — ABNORMAL HIGH (ref 70–99)
Glucose-Capillary: 253 mg/dL — ABNORMAL HIGH (ref 70–99)

## 2020-11-18 LAB — RESP PANEL BY RT-PCR (FLU A&B, COVID) ARPGX2
Influenza A by PCR: NEGATIVE
Influenza B by PCR: NEGATIVE
SARS Coronavirus 2 by RT PCR: NEGATIVE

## 2020-11-18 LAB — LACTIC ACID, PLASMA: Lactic Acid, Venous: 1.1 mmol/L (ref 0.5–1.9)

## 2020-11-18 LAB — MRSA NEXT GEN BY PCR, NASAL: MRSA by PCR Next Gen: NOT DETECTED

## 2020-11-18 LAB — PROCALCITONIN: Procalcitonin: 0.67 ng/mL

## 2020-11-18 LAB — CORTISOL: Cortisol, Plasma: 9.2 ug/dL

## 2020-11-18 MED ORDER — CHLORHEXIDINE GLUCONATE 0.12 % MT SOLN
15.0000 mL | Freq: Two times a day (BID) | OROMUCOSAL | Status: DC
  Administered 2020-11-19 – 2020-11-21 (×4): 15 mL via OROMUCOSAL
  Filled 2020-11-18 (×4): qty 15

## 2020-11-18 MED ORDER — ORAL CARE MOUTH RINSE
15.0000 mL | Freq: Two times a day (BID) | OROMUCOSAL | Status: DC
  Administered 2020-11-19 (×2): 15 mL via OROMUCOSAL

## 2020-11-18 MED ORDER — CHLORHEXIDINE GLUCONATE 0.12% ORAL RINSE (MEDLINE KIT)
15.0000 mL | Freq: Two times a day (BID) | OROMUCOSAL | Status: DC
  Administered 2020-11-18 (×2): 15 mL via OROMUCOSAL

## 2020-11-18 MED ORDER — ORAL CARE MOUTH RINSE
15.0000 mL | OROMUCOSAL | Status: DC
  Administered 2020-11-18 (×7): 15 mL via OROMUCOSAL

## 2020-11-18 MED ORDER — INSULIN ASPART 100 UNIT/ML IJ SOLN
1.0000 [IU] | INTRAMUSCULAR | Status: DC
  Administered 2020-11-18: 3 [IU] via SUBCUTANEOUS
  Administered 2020-11-18: 1 [IU] via SUBCUTANEOUS

## 2020-11-18 MED ORDER — HEPARIN SODIUM (PORCINE) 1000 UNIT/ML DIALYSIS
4000.0000 [IU] | INTRAMUSCULAR | Status: AC | PRN
  Administered 2020-11-19 – 2020-11-21 (×2): 4000 [IU] via INTRAVENOUS_CENTRAL
  Filled 2020-11-18 (×2): qty 4

## 2020-11-18 MED ORDER — FENTANYL 2500MCG IN NS 250ML (10MCG/ML) PREMIX INFUSION
50.0000 ug/h | INTRAVENOUS | Status: DC
  Administered 2020-11-18: 100 ug/h via INTRAVENOUS

## 2020-11-18 MED ORDER — INSULIN ASPART 100 UNIT/ML IJ SOLN
0.0000 [IU] | INTRAMUSCULAR | Status: DC
  Administered 2020-11-18: 3 [IU] via SUBCUTANEOUS
  Administered 2020-11-18: 5 [IU] via SUBCUTANEOUS
  Administered 2020-11-18: 2 [IU] via SUBCUTANEOUS
  Administered 2020-11-19 – 2020-11-20 (×2): 3 [IU] via SUBCUTANEOUS
  Administered 2020-11-20: 2 [IU] via SUBCUTANEOUS
  Administered 2020-11-20: 3 [IU] via SUBCUTANEOUS
  Administered 2020-11-20 – 2020-11-21 (×2): 2 [IU] via SUBCUTANEOUS
  Administered 2020-11-22 (×2): 3 [IU] via SUBCUTANEOUS
  Administered 2020-11-22: 2 [IU] via SUBCUTANEOUS
  Administered 2020-11-22 – 2020-11-23 (×2): 3 [IU] via SUBCUTANEOUS
  Administered 2020-11-23: 2 [IU] via SUBCUTANEOUS
  Administered 2020-11-23: 3 [IU] via SUBCUTANEOUS
  Administered 2020-11-23 (×2): 5 [IU] via SUBCUTANEOUS
  Administered 2020-11-24 (×2): 3 [IU] via SUBCUTANEOUS

## 2020-11-18 MED ORDER — INFLUENZA VAC SPLIT QUAD 0.5 ML IM SUSY
0.5000 mL | PREFILLED_SYRINGE | INTRAMUSCULAR | Status: DC | PRN
  Filled 2020-11-18: qty 0.5

## 2020-11-18 MED ORDER — FENTANYL BOLUS VIA INFUSION
50.0000 ug | INTRAVENOUS | Status: DC | PRN
  Administered 2020-11-18 (×2): 50 ug via INTRAVENOUS
  Administered 2020-11-18: 100 ug via INTRAVENOUS
  Filled 2020-11-18: qty 100

## 2020-11-18 MED ORDER — CHLORHEXIDINE GLUCONATE CLOTH 2 % EX PADS: 6.0000 | MEDICATED_PAD | Freq: Every day | CUTANEOUS | Status: DC

## 2020-11-18 MED ORDER — FENTANYL CITRATE (PF) 100 MCG/2ML IJ SOLN
50.0000 ug | Freq: Once | INTRAMUSCULAR | Status: DC
Start: 2020-11-18 — End: 2020-11-18

## 2020-11-18 MED ORDER — DEXMEDETOMIDINE HCL IN NACL 400 MCG/100ML IV SOLN: 0.4000 ug/kg/h | INTRAVENOUS | Status: DC

## 2020-11-18 MED ORDER — CHLORHEXIDINE GLUCONATE CLOTH 2 % EX PADS
6.0000 | MEDICATED_PAD | Freq: Every day | CUTANEOUS | Status: DC
  Administered 2020-11-18 – 2020-11-25 (×7): 6 via TOPICAL

## 2020-11-18 NOTE — Progress Notes (Signed)
eLink Physician-Brief Progress Note Patient Name: [name] DOB: 1965-05-18 MRN: [id number]   Date of Service  11/18/2020  HPI/Events of Note  Pt extubated today.  Somnolent this evening with an abg showing mild co2 retention.  eICU Interventions  Agree w plan to use BiPAP overnight.      Intervention Category Intermediate Interventions: Change in mental status - evaluation and management  Tilden Dome 11/18/2020, 9:20 PM

## 2020-11-18 NOTE — Consult Note (Addendum)
Renal Service Consult Note [facility]  [name] 11/18/2020 Sol Blazing, MD Requesting Physician: Dr. Tacy Learn  Reason for Consult: ESRD pt w/ knee pain and acute resp failure HPI: The patient is a 55 y.o. year-old w/ hx of CAD, ESRD on HD, HL, HTN, mod-severe mitral regurgitation, PAD, OSA, thoracic AAA, DM2 on insulin who presented to ED w/ c/o knee pain, unable to complete her full HD session yesterday. In the ED she received IV pain meds and then became apneic later in the ED required intubation. Labs showed normal K+. Takes oxycodone daily for chronic pain.    Last admit here was in Jan 2022 w/ SOB due to volume overload, also had acute covid infection at that time. Was in hosptial for 1 week approx.   Pt on vent, no hx obatined.       ROS - n/a   Past Medical History  Past Medical History:  Diagnosis Date   Anemia    Anxiety    Arthritis    "knees" "hands", "RA"   CAD (coronary artery disease)    Nonobstructive on CT 2019   CHF (congestive heart failure) ([place])    COVID-19 10/2018   Dyspnea    "when I have too much fluid."   ESRD (end stage renal disease) ([place])    Dialysis [facility]- [address]   Headache(784.0)    Heart murmur    High cholesterol    History of blood transfusion    Hypertension    Mitral regurgitation    moderate to severe MR 10/2018 echo   Nerve pain    "they say I have L5 nerve damage; my lumbar"   Pericardial effusion    Pneumonia    ; 11/16/2018- "touch of pneumonia" - seen in ED- 11/14/2018- on antibiotic. Has had it x 2   PVD (peripheral vascular disease) ([place])    Right leg stent in [place].  (No records)   Restless legs    Sleep apnea    does not use Cpap   Thoracic ascending aortic aneurysm    4.4 cm 11/14/18 CTA   Type II diabetes mellitus [facility])    Past Surgical History  Past Surgical History:  Procedure Laterality Date   AMPUTATION Left 12/27/2018   Procedure: LEFT THUMB REVISION AMPUTATION DIGIT;   Surgeon: Charlotte Crumb, MD;  Location: [place];  Service: Orthopedics;  Laterality: Left;   AV FISTULA PLACEMENT Left    AV FISTULA PLACEMENT Right 03/12/2019   Procedure: INSERTION OF ARTERIOVENOUS (AV) GORE-TEX GRAFT ARM;  Surgeon: Elam Dutch, MD;  Location: [facility] OR;  Service: Vascular;  Laterality: Right;   AV FISTULA PLACEMENT Right 08/13/2019   Procedure: RIGHT UPPER ARM ARTERIOVENOUS (AV) FISTULA CREATION WITH BASILIC VEIN;  Surgeon: Elam Dutch, MD;  Location: [place];  Service: Vascular;  Laterality: Right;   [place]; 1997   x 2   COLONOSCOPY     EYE SURGERY Bilateral    cataract surgery   INSERTION OF DIALYSIS CATHETER Right 09/26/2018   Procedure: INSERTION OF DIALYSIS CATHETER Right Internal Jugular.;  Surgeon: Elam Dutch, MD;  Location: [place];  Service: Vascular;  Laterality: Right;   IR FLUORO GUIDE CV LINE RIGHT  05/04/2019   IR US GUIDE VASC ACCESS RIGHT  05/04/2019   LIGATION OF ARTERIOVENOUS  FISTULA Left 09/26/2018   Procedure: LIGATION OF ARTERIOVENOUS  FISTULA LEFT ARM;  Surgeon: Elam Dutch, MD;  Location: [place];  Service: Vascular;  Laterality: Left;   PERICARDIAL WINDOW  02/2004   for pericardial effusion   PERIPHERAL VASCULAR INTERVENTION Right 10/26/2019   Procedure: PERIPHERAL VASCULAR INTERVENTION;  Surgeon: Elam Dutch, MD;  Location: [place] CV [facility];  Service: Cardiovascular;  Laterality: Right;  arm  AV fistula   TUBAL LIGATION  05/1995   UPPER EXTREMITY VENOGRAPHY N/A 06/22/2019   Procedure: UPPER EXTREMITY VENOGRAPHY - Right Central;  Surgeon: Elam Dutch, MD;  Location: [place] CV LAB;  Service: Cardiovascular;  Laterality: N/A;   Family History  Family History  Problem Relation Age of Onset   Cancer Brother    Heart disease Father        Died age 48   Diabetes Father    Hyperlipidemia Father    Hypertension Father    Stroke Father    Diabetes Mother    Hypertension Mother    Stroke Mother    Dementia  Mother    Diabetes Sister    Diabetes Brother    Hypertension Sister    Hypertension Brother    Social History  reports that she quit smoking about 16 months ago. Her smoking use included cigarettes. She has never used smokeless tobacco. She reports that she does not drink alcohol and does not use drugs. Allergies  Allergies  Allergen Reactions   Bupropion Itching   Home medications Prior to Admission medications   Medication Sig Start Date End Date Taking? Authorizing Provider  AURYXIA 1 GM 210 MG(Fe) tablet Take 420-840 mg by mouth See admin instructions. Take 4 tablets (840 mg) by mouth with each meal & take 2 tablets (420 mg) by mouth with each snack 05/29/18  Yes [provider]  Insulin Lispro Prot & Lispro (HUMALOG 75/25 MIX) (75-25) 100 UNIT/ML Kwikpen Inject 35 Units into the skin 2 (two) times daily with a meal. Patient taking differently: Inject 25-30 Units into the skin See admin instructions. 25 units in the morning and 30 units in the evening 05/01/20  Yes Renato Shin, MD  magic mouthwash SOLN Use as directed 5 mLs in the mouth or throat 4 (four) times daily as needed for mouth pain. 11/05/20  Yes [provider]  metoprolol succinate (TOPROL-XL) 100 MG 24 hr tablet Take 100 mg by mouth daily. Take with or immediately following a meal.   Yes [provider]  oxyCODONE-acetaminophen (PERCOCET) 10-325 MG tablet Take 1 tablet by mouth 3 (three) times daily as needed for pain. 11/04/20  Yes [provider]  pramipexole (MIRAPEX) 0.5 MG tablet Take 0.5 mg by mouth daily.  05/18/17  Yes [provider]  albuterol (VENTOLIN HFA) 108 (90 Base) MCG/ACT inhaler Inhale 2 puffs into the lungs every 6 (six) hours as needed for wheezing or shortness of breath. 02/22/20   Thurnell Lose, MD  b complex-vitamin c-folic acid (NEPHRO-VITE) 0.8 MG TABS tablet Take 1 tablet by mouth every Monday, Wednesday, and Friday with hemodialysis. 04/12/19   [provider]  calcitRIOL (ROCALTROL) 0.5 MCG capsule Take 0.5 mcg by mouth as directed. Given at Dialysis    [provider]  carbamide peroxide (DEBROX) 6.5 % OTIC solution Place 5 drops into both ears 2 (two) times daily. 10/01/20   Domenic Moras, PA-C  glucose blood (ONETOUCH VERIO) test strip 1 each by Other route 2 (two) times daily. And lancets 2/day 04/10/20   Renato Shin, MD  HUMIRA PEN 40 MG/0.4ML PNKT Inject 40 mg into the skin every 14 (fourteen) days. 08/22/20  [provider]  hydrOXYzine (ATARAX/VISTARIL) 25 MG tablet Take 25 mg by mouth daily.    [provider]  lidocaine (LIDODERM) 5 % Place 2 patches onto the skin daily. Remove & Discard patch within 12 hours or as directed by MD 11/12/20   Redwine, Madison A, PA-C  methocarbamol (ROBAXIN) 500 MG tablet Take 1 tablet (500 mg total) by mouth every 8 (eight) hours as needed for muscle spasms. Patient not taking: Reported on 11/18/2020 04/25/20   Lucrezia Starch, MD  Methoxy PEG-Epoetin Beta (MIRCERA IJ) Mircera 09/01/19   [provider]  mupirocin ointment (BACTROBAN) 2 % Apply 1 application topically 2 (two) times daily. 11/06/20   McDonald, Stephan Minister, DPM  NARCAN 4 MG/0.1ML LIQD nasal spray kit Place 1 spray into the nose as needed (accidental overdose.).  10/10/19   [provider]  rosuvastatin (CRESTOR) 40 MG tablet TAKE 1 TABLET BY MOUTH DAILY *PATIENT NEEDS APPOINTMENT* Patient taking differently: No sig reported 11/13/20   Minus Breeding, MD     Vitals:   11/18/20 1300 11/18/20 1311 11/18/20 1543 11/18/20 1544  BP: (!) 106/48     Pulse: 90 95  96  Resp: (!) 23 (!) 22  (!) 23  Temp:   99.2 F (37.3 C)   TempSrc:   Axillary   SpO2: 100% 97%  94%  Weight:       Exam Gen on vent, sedated No rash, cyanosis or gangrene Sclera anicteric, throat w/ ETT No jvd or bruits Chest clear anterior/ lateral RRR no MRG Abd soft ntnd no mass or ascites +bs GU deferred MS no joint  effusions or deformity Ext no LE or UE edema, no wounds or ulcers Neuro is on vent, sedated  RUA AVF+bruit       Home meds include - humalog 75/25 mix 25 u bid, auryxia 4 ac tid, toprol xl 100, percocet prn, mirapex , nephrovite, humira, robaxin prn, narcan prn, crestor, prns    OP HD: G-O MWF  4h  450/800   111.5kg  2/2 bath  15ga  RUA AVF Hep 10,000  - calc 1.0 ug tiw   - parsabiv 5 mg IV tiw    Na 136  K 4.2  CO2 28  BUN 34  Creat 6.10  Ca 8.2  alb 2.6   Hb 85, 7.4  WBC 7K   Assessment/ Plan: Resp failure - w/ bilat infiltrates prob c/w PNA or aspiration pneumonitis. Getting IV abx doxy and Rocephin Hypotension - was on low dose pressors overnight, looks like they are weaned off now ESRD - on HD MWF.  Will plan HD tomorrow. BP/volume - holding home BP meds, BP's soft but improving. 2kg up , doubt any pulm edema. UF 1-2 L w/ HD tomorrow.  MBD ckd - resume meds when eating ANemia ckd - Hb 7- 9 here, not on esa at OP unit, will double check that IDDM =- per pmd      Kelly Splinter  MD 11/18/2020, 4:14 PM  Recent Labs  Lab 11/17/20 1700 11/17/20 2005 11/18/20 0402 11/18/20 0403  WBC 8.3  --   --  7.1  HGB 8.4*   < > 10.5* 7.4*   < > = values in this interval not displayed.   Recent Labs  Lab 11/17/20 1700 11/17/20 2005 11/18/20 0402 11/18/20 0403  K 4.7   < > 4.3 4.2  BUN 27*  --   --  34*  CREATININE 5.23*  --   --  6.10*  CALCIUM 7.8*  --   --  8.2*   < > = values in this interval not displayed.

## 2020-11-18 NOTE — Procedures (Signed)
Extubation Procedure Note  Patient Details:   Name: [name] DOB: 04-May-1965 MRN: [id number]   Airway Documentation:    Vent end date: 11/18/20 Vent end time: 1305   Evaluation  O2 sats: stable throughout Complications: No apparent complications Patient did tolerate procedure well. Bilateral Breath Sounds: Clear, Diminished   Yes  Pt awake and oriented following commands. Able to say her name and where she was and ask questions.  Placed on 4 L [place] sat 97%.  Good, strong cough.  Vaughan Basta [place] 11/18/2020, 1:13 PM

## 2020-11-18 NOTE — Progress Notes (Signed)
NAME:  [name], MRN:  [id number], DOB:  07/27/1965, LOS: 1 ADMISSION DATE:  11/17/2020, CONSULTATION DATE:  11/17/20 REFERRING MD:  EDP CHIEF COMPLAINT:  encephalopathy  History of Present Illness:  [name] is a 55 year old woman with ESRD on HD who presented with R knee pain after a fall on 10/14, was in ED since 1040 am. Was unable to complete HD today due to pain.  Last oxycodone 10/16 pm.   Yesterday had knee pain but was able to extend the knee.  In the ED she was given dilaudid 96m at 1245 . Later this afternoon, developed period of apnea, low blood pressure and hypoxia.   Did not respond to narcan. Was intubated for airway protection.  Remained obtunded.    Takes oxycodone 33mdaily for chronic pain  IN ED: dilaudid 23m41m245, zofran 4mg81m 7.348/59.6/79 (2000) AP 144  Ca 7.8 alb 2.6  D dimer  2.62 Covid pending Blood culture pending.  Procal pending.  Pertinent  Medical History  CAD CHF Covid 19 hx  ESRD on HD  HLD HTN MR (severe) Thoracic aortic aneurism  DM2  PVD  OSA (not on cpap)  Home meds: albuterol, auryxia, calcitriol, insulin, hydroxyzine 25mg61mly, insulin, roxabin, metoprolol, narcan prn, oxycodone 10mg 17m mirapex, prednisone, crestor, humira  Significant Hospital Events: Including procedures, antibiotic start and stop dates in addition to other pertinent events   10/17 intubated, started on levophed and admitted to the ICU  Interim History / Subjective:  No acute overnight events. Lethargic on exam, awakens to verbal stimuli. Remains on pressors and MV.   Objective   Blood pressure 108/61, pulse 75, temperature 99.8 F (37.7 C), temperature source Oral, resp. rate 12, weight 113.7 kg, SpO2 97 %.    Vent Mode: PRVC FiO2 (%):  [50 %-100 %] 50 % Set Rate:  [14 bmp] 14 bmp Vt Set:  [480 mL-510 mL] 510 mL PEEP:  [5 cmH20] 5 cmH20 Plateau Pressure:  [15 cmH20-27 cmH20] 25 cmH20   Intake/Output Summary (Last 24 hours) at 11/18/2020  0704 Last data filed at 11/18/2020 0600 Gross per 24 hour  Intake 2073.71 ml  Output --  Net 2073.71 ml   Filed Weights   11/17/20 2339 11/18/20 0500  Weight: 113.7 kg 113.7 kg    Examination: General: ill appearing woman, opens eyes to verbal stimuli, lethargic HENT: PERRLA, [place]/AT Lungs: bibasilar crackles, on MV Cardiovascular: RRR, no murmurs  Abdomen: soft, nondistended, non tender Extremities: tender to palpation of right knee, no swelling or edema Neuro: wakes and opens eyes to verbal stimuli, lethargic   CXR IMPRESSION: 1. Endotracheal tube tip about 3 cm superior to carina 2. Cardiomegaly with vascular congestion and probable pulmonary edema. 3. More focal areas of consolidation and ground-glass opacity at the right apex and both bases suspicious for superimposed pneumonia 4. Enlarged mediastinal silhouette, likely augmented by portable technique and lordotic view.  CT head  IMPRESSION: Chronic appearing white matter insults affecting both cerebral hemispheres which could be due to chronic small vessel disease and or demyelinating disease. Given the vascular risk factors, this may simply represent atherosclerotic vascular disease.   No sign of acute CT abnormality.   Resolved Hospital Problem list     Assessment & Plan:   Acute hypoxic and hypercapnic respiratory failure Acute encephalopathy  Thought to be secondary to narcotics however did not respond to Narcan.  Was given 2 mg IV Dilaudid in the ER for right knee  pain prior to becoming altered, hypotensive and hypoxic.  She was intubated for airway protection.  CT head unremarkable.  Chest radiograph shows bilateral airspace disease. - Daily SBT and SAT  - VAP and PAD protocol  Undifferentiated shock On norepinephrine 2 mcg.  Lactic acid normal.  Blood culture pending.  COVID-negative.  Strep pneumo and Legionella urinary antigen pending.  Procalcitonin 0.67. TSH nl. Cortisol wnl - Wean pressors for MAP  goal >65 - Continue ceftriaxone and doxycyline  - Follow blood cultures  Knee pain Per chart review it looks like her knee pain was right-sided in nature secondary to a fall a week ago however the left side was imaged.  Patient to palpation of the right knee.  Will obtain radiographs of the right knee.  ESRD on HD Last HD on 10/17.  Electrolytes stable. -Consult nephrology  Acute on chronic anemia Hemoglobin 7.4, possibly dilutional as all cell lines are down.   -Trend CBC -Transfuse for hemoglobin less than 7  Type 2 diabetes Home medication includes insulin 75/25 mix 35 units twice daily. -Blood glucose goal 140-1 80 -SSI  Rheumatoid arthritis On Humira and prednisone   Best Practice (right click and "Reselect all SmartList Selections" daily)   Diet/type: NPO DVT prophylaxis: prophylactic heparin  GI prophylaxis: N/A Lines: Central line and Dialysis Catheter Foley:  N/A Code Status:  full code Last date of multidisciplinary goals of care discussion [10/18]  Labs   CBC: Recent Labs  Lab 11/12/20 1517 11/17/20 1700 11/17/20 2005 11/18/20 0402 11/18/20 0403  WBC 8.6 8.3  --   --  7.1  NEUTROABS 6.7 6.7  --   --   --   HGB 9.6* 8.4* 8.5* 10.5* 7.4*  HCT 30.1* 27.3* 25.0* 31.0* 23.7*  MCV 98.4 101.5*  --   --  98.3  PLT 240 264  --   --  951    Basic Metabolic Panel: Recent Labs  Lab 11/12/20 1517 11/17/20 1700 11/17/20 2005 11/18/20 0402 11/18/20 0403  NA 133* 135 133* 136 136  K 4.8 4.7 4.6 4.3 4.2  CL 91* 97*  --   --  94*  CO2 24 28  --   --  28  GLUCOSE 226* 310*  --   --  224*  BUN 47* 27*  --   --  34*  CREATININE 8.14* 5.23*  --   --  6.10*  CALCIUM 9.2 7.8*  --   --  8.2*   GFR: Estimated Creatinine Clearance: 13.6 mL/min (A) (by C-G formula based on SCr of 6.1 mg/dL (H)). Recent Labs  Lab 11/12/20 1517 11/17/20 1700 11/17/20 2224 11/18/20 0035 11/18/20 0403  PROCALCITON  --   --  0.67  --   --   WBC 8.6 8.3  --   --  7.1   LATICACIDVEN  --   --   --  1.1  --     Liver Function Tests: Recent Labs  Lab 11/12/20 1517 11/17/20 1700  AST 14* 14*  ALT 13 14  ALKPHOS 149* 144*  BILITOT 0.8 0.7  PROT 8.5* 7.3  ALBUMIN 3.0* 2.6*   No results for input(s): LIPASE, AMYLASE in the last 168 hours. No results for input(s): AMMONIA in the last 168 hours.  ABG    Component Value Date/Time   PHART 7.367 02/18/2020 1237   PCO2ART 51.9 (H) 02/18/2020 1237   PO2ART 60 (L) 02/18/2020 1237   HCO3 32.3 (H) 11/18/2020 0402   TCO2 34 (H) 11/18/2020  0402   O2SAT 58.0 11/18/2020 0402     Coagulation Profile: No results for input(s): INR, PROTIME in the last 168 hours.  Cardiac Enzymes: No results for input(s): CKTOTAL, CKMB, CKMBINDEX, TROPONINI in the last 168 hours.  HbA1C: Hemoglobin A1C  Date/Time Value Ref Range Status  05/01/2020 08:28 AM 9.7 (A) 4.0 - 5.6 % Final   Hgb A1c MFr Bld  Date/Time Value Ref Range Status  02/18/2020 07:06 PM 8.5 (H) 4.8 - 5.6 % Final    Comment:    (NOTE) Pre diabetes:          5.7%-6.4%  Diabetes:              >6.4%  Glycemic control for   <7.0% adults with diabetes   09/19/2018 08:33 AM 8.7 (H) 4.8 - 5.6 % Final    Comment:    (NOTE) Pre diabetes:          5.7%-6.4% Diabetes:              >6.4% Glycemic control for   <7.0% adults with diabetes     CBG: Recent Labs  Lab 11/17/20 1554 11/18/20 0331  GLUCAP 259* 216*    Review of Systems:     Past Medical History:  She,  has a past medical history of Anemia, Anxiety, Arthritis, CAD (coronary artery disease), CHF (congestive heart failure) ([place]), COVID-19 (10/2018), Dyspnea, ESRD (end stage renal disease) ([place]), Headache(784.0), Heart murmur, High cholesterol, History of blood transfusion, Hypertension, Mitral regurgitation, Nerve pain, Pericardial effusion, Pneumonia, PVD (peripheral vascular disease) ([place]), Restless legs, Sleep apnea, Thoracic ascending aortic aneurysm, and Type II diabetes mellitus  ([place]).   Surgical History:   Past Surgical History:  Procedure Laterality Date   AMPUTATION Left 12/27/2018   Procedure: LEFT THUMB REVISION AMPUTATION DIGIT;  Surgeon: Charlotte Crumb, MD;  Location: [facility];  Service: Orthopedics;  Laterality: Left;   AV FISTULA PLACEMENT Left    AV FISTULA PLACEMENT Right 03/12/2019   Procedure: INSERTION OF ARTERIOVENOUS (AV) GORE-TEX GRAFT ARM;  Surgeon: Elam Dutch, MD;  Location: [facility];  Service: Vascular;  Laterality: Right;   AV FISTULA PLACEMENT Right 08/13/2019   Procedure: RIGHT UPPER ARM ARTERIOVENOUS (AV) FISTULA CREATION WITH BASILIC VEIN;  Surgeon: Elam Dutch, MD;  Location: [facility];  Service: Vascular;  Laterality: Right;   [place]; 1997   x 2   COLONOSCOPY     EYE SURGERY Bilateral    cataract surgery   INSERTION OF DIALYSIS CATHETER Right 09/26/2018   Procedure: INSERTION OF DIALYSIS CATHETER Right Internal Jugular.;  Surgeon: Elam Dutch, MD;  Location: [facility];  Service: Vascular;  Laterality: Right;   IR FLUORO GUIDE CV LINE RIGHT  05/04/2019   IR US GUIDE VASC ACCESS RIGHT  05/04/2019   LIGATION OF ARTERIOVENOUS  FISTULA Left 09/26/2018   Procedure: LIGATION OF ARTERIOVENOUS  FISTULA LEFT ARM;  Surgeon: Elam Dutch, MD;  Location: [facility];  Service: Vascular;  Laterality: Left;   PERICARDIAL WINDOW  02/2004   for pericardial effusion   PERIPHERAL VASCULAR INTERVENTION Right 10/26/2019   Procedure: PERIPHERAL VASCULAR INTERVENTION;  Surgeon: Elam Dutch, MD;  Location: [facility];  Service: Cardiovascular;  Laterality: Right;  arm  AV fistula   TUBAL LIGATION  05/1995   UPPER EXTREMITY VENOGRAPHY N/A 06/22/2019   Procedure: UPPER EXTREMITY VENOGRAPHY - Right Central;  Surgeon: Elam Dutch, MD;  Location: [facility];  Service: Cardiovascular;  Laterality: N/A;     Social History:   reports that she quit smoking about 16 months ago. Her smoking use included cigarettes. She has  never used smokeless tobacco. She reports that she does not drink alcohol and does not use drugs.   Family History:  Her family history includes Cancer in her brother; Dementia in her mother; Diabetes in her brother, father, mother, and sister; Heart disease in her father; Hyperlipidemia in her father; Hypertension in her brother, father, mother, and sister; Stroke in her father and mother.   Allergies Allergies  Allergen Reactions   Bupropion Itching     Home Medications  Prior to Admission medications   Medication Sig Start Date End Date Taking? Authorizing Provider  albuterol (VENTOLIN HFA) 108 (90 Base) MCG/ACT inhaler Inhale 2 puffs into the lungs every 6 (six) hours as needed for wheezing or shortness of breath. 02/22/20   Thurnell Lose, MD  AURYXIA 1 GM 210 MG(Fe) tablet Take 630-1,050 mg by mouth See admin instructions. Take 5 tablets (1050 mg) by mouth with each meal & take 3 tablets (630 mg) by mouth with each snack 05/29/18   [provider]  b complex-vitamin c-folic acid (NEPHRO-VITE) 0.8 MG TABS tablet Take 1 tablet by mouth every Monday, Wednesday, and Friday with hemodialysis. 04/12/19   [provider]  calcitRIOL (ROCALTROL) 0.5 MCG capsule Take 0.5 mcg by mouth as directed. Given at Dialysis    [provider]  carbamide peroxide (DEBROX) 6.5 % OTIC solution Place 5 drops into both ears 2 (two) times daily. 10/01/20   Domenic Moras, PA-C  glucose blood (ONETOUCH VERIO) test strip 1 each by Other route 2 (two) times daily. And lancets 2/day 04/10/20   Renato Shin, MD  HUMIRA PEN 40 MG/0.4ML PNKT SMARTSIG:40 Milligram(s) SUB-Q Every 2 Weeks 08/22/20   [provider]  hydrOXYzine (ATARAX/VISTARIL) 25 MG tablet Take 25 mg by mouth daily.    [provider]  Insulin Lispro Prot & Lispro (HUMALOG 75/25 MIX) (75-25) 100 UNIT/ML Kwikpen Inject 35 Units into the skin 2 (two) times daily with a meal. 05/01/20   Renato Shin, MD  lidocaine  (LIDODERM) 5 % Place 2 patches onto the skin daily. Remove & Discard patch within 12 hours or as directed by MD 11/12/20   Redwine, Madison A, PA-C  methocarbamol (ROBAXIN) 500 MG tablet Take 1 tablet (500 mg total) by mouth every 8 (eight) hours as needed for muscle spasms. 04/25/20   Lucrezia Starch, MD  Methoxy PEG-Epoetin Beta (MIRCERA IJ) Mircera 09/01/19   [provider]  metoprolol succinate (TOPROL-XL) 100 MG 24 hr tablet Take 100 mg by mouth daily. Take with or immediately following a meal.    [provider]  mupirocin ointment (BACTROBAN) 2 % Apply 1 application topically 2 (two) times daily. 11/06/20   McDonald, Stephan Minister, DPM  NARCAN 4 MG/0.1ML LIQD nasal spray kit Place 1 spray into the nose as needed (accidental overdose.).  10/10/19   [provider]  Oxycodone HCl 10 MG TABS Take 10 mg by mouth 3 (three) times daily. 02/08/20   [provider]  pramipexole (MIRAPEX) 0.5 MG tablet Take 0.5 mg by mouth daily.  05/18/17   [provider]  predniSONE (DELTASONE) 10 MG tablet TAKE 4 TABLETS (40 MG TOTAL) BY MOUTH DAILY WITH BREAKFAST FOR 4 DAYS. 02/28/20 02/27/21  Donne Hazel, MD  rosuvastatin (CRESTOR) 40 MG tablet TAKE 1 TABLET BY MOUTH DAILY *PATIENT NEEDS APPOINTMENT* 11/13/20  Minus Breeding, MD    [place], DO PGY-3 IMTS

## 2020-11-19 ENCOUNTER — Encounter (HOSPITAL_COMMUNITY): Payer: Self-pay | Admitting: Pulmonary Disease

## 2020-11-19 DIAGNOSIS — G9340 Encephalopathy, unspecified: Secondary | ICD-10-CM | POA: Diagnosis not present

## 2020-11-19 LAB — CBC WITH DIFFERENTIAL/PLATELET
Abs Immature Granulocytes: 0.12 10*3/uL — ABNORMAL HIGH (ref 0.00–0.07)
Basophils Absolute: 0 10*3/uL (ref 0.0–0.1)
Basophils Relative: 0 %
Eosinophils Absolute: 0.2 10*3/uL (ref 0.0–0.5)
Eosinophils Relative: 3 %
HCT: 22.9 % — ABNORMAL LOW (ref 36.0–46.0)
Hemoglobin: 7 g/dL — ABNORMAL LOW (ref 12.0–15.0)
Immature Granulocytes: 1 %
Lymphocytes Relative: 11 %
Lymphs Abs: 0.9 10*3/uL (ref 0.7–4.0)
MCH: 30.7 pg (ref 26.0–34.0)
MCHC: 30.6 g/dL (ref 30.0–36.0)
MCV: 100.4 fL — ABNORMAL HIGH (ref 80.0–100.0)
Monocytes Absolute: 0.5 10*3/uL (ref 0.1–1.0)
Monocytes Relative: 6 %
Neutro Abs: 6.8 10*3/uL (ref 1.7–7.7)
Neutrophils Relative %: 79 %
Platelets: 199 10*3/uL (ref 150–400)
RBC: 2.28 MIL/uL — ABNORMAL LOW (ref 3.87–5.11)
RDW: 14.3 % (ref 11.5–15.5)
WBC: 8.6 10*3/uL (ref 4.0–10.5)
nRBC: 0 % (ref 0.0–0.2)

## 2020-11-19 LAB — BASIC METABOLIC PANEL
Anion gap: 14 (ref 5–15)
BUN: 44 mg/dL — ABNORMAL HIGH (ref 6–20)
CO2: 28 mmol/L (ref 22–32)
Calcium: 8.3 mg/dL — ABNORMAL LOW (ref 8.9–10.3)
Chloride: 94 mmol/L — ABNORMAL LOW (ref 98–111)
Creatinine, Ser: 7.69 mg/dL — ABNORMAL HIGH (ref 0.44–1.00)
GFR, Estimated: 6 mL/min — ABNORMAL LOW (ref >60)
Glucose, Bld: 99 mg/dL (ref 70–99)
Potassium: 4.4 mmol/L (ref 3.5–5.1)
Sodium: 136 mmol/L (ref 135–145)

## 2020-11-19 LAB — MAGNESIUM: Magnesium: 2 mg/dL (ref 1.7–2.4)

## 2020-11-19 LAB — GLUCOSE, CAPILLARY
Glucose-Capillary: 94 mg/dL (ref 70–99)
Glucose-Capillary: 82 mg/dL (ref 70–99)
Glucose-Capillary: 104 mg/dL — ABNORMAL HIGH (ref 70–99)
Glucose-Capillary: 121 mg/dL — ABNORMAL HIGH (ref 70–99)
Glucose-Capillary: 173 mg/dL — ABNORMAL HIGH (ref 70–99)
Glucose-Capillary: 156 mg/dL — ABNORMAL HIGH (ref 70–99)

## 2020-11-19 LAB — HEPATITIS B SURFACE ANTIGEN: Hepatitis B Surface Ag: NONREACTIVE

## 2020-11-19 LAB — HEPATITIS B SURFACE ANTIBODY,QUALITATIVE: Hep B S Ab: REACTIVE — AB

## 2020-11-19 MED ORDER — CALCITRIOL 0.25 MCG PO CAPS: 0.5000 ug | ORAL_CAPSULE | ORAL | Status: DC

## 2020-11-19 MED ORDER — FERRIC CITRATE 1 GM 210 MG(FE) PO TABS
420.0000 mg | ORAL_TABLET | ORAL | Status: DC
  Filled 2020-11-19 (×2): qty 2

## 2020-11-19 MED ORDER — FERRIC CITRATE 1 GM 210 MG(FE) PO TABS
840.0000 mg | ORAL_TABLET | Freq: Three times a day (TID) | ORAL | Status: DC
  Administered 2020-11-22 – 2020-11-23 (×6): 840 mg via ORAL
  Filled 2020-11-19 (×14): qty 4

## 2020-11-19 MED ORDER — OXYCODONE HCL 5 MG PO TABS
5.0000 mg | ORAL_TABLET | Freq: Three times a day (TID) | ORAL | Status: DC | PRN
  Filled 2020-11-19: qty 1

## 2020-11-19 MED ORDER — PRAMIPEXOLE DIHYDROCHLORIDE 0.25 MG PO TABS
0.5000 mg | ORAL_TABLET | Freq: Every day | ORAL | Status: DC
  Administered 2020-11-21 – 2020-11-27 (×7): 0.5 mg via ORAL
  Filled 2020-11-19 (×9): qty 2

## 2020-11-19 MED ORDER — OXYCODONE-ACETAMINOPHEN 5-325 MG PO TABS: 1.0000 | ORAL_TABLET | Freq: Three times a day (TID) | ORAL | Status: DC | PRN

## 2020-11-19 MED ORDER — FENTANYL CITRATE (PF) 100 MCG/2ML IJ SOLN
25.0000 ug | Freq: Once | INTRAMUSCULAR | Status: AC
  Administered 2020-11-19: 25 ug via INTRAVENOUS
  Filled 2020-11-19: qty 2

## 2020-11-19 MED ORDER — SODIUM CHLORIDE 0.9% FLUSH
10.0000 mL | INTRAVENOUS | Status: DC | PRN
  Administered 2020-11-19: 10 mL

## 2020-11-19 MED ORDER — GLUCOSE BLOOD VI STRP: 1.0000 | ORAL_STRIP | Freq: Two times a day (BID) | Status: DC

## 2020-11-19 MED ORDER — CARBAMIDE PEROXIDE 6.5 % OT SOLN: 5.0000 [drp] | Freq: Two times a day (BID) | OTIC | Status: DC

## 2020-11-19 MED ORDER — ADALIMUMAB 40 MG/0.4ML ~~LOC~~ AJKT: 40.0000 mg | AUTO-INJECTOR | SUBCUTANEOUS | Status: DC

## 2020-11-19 MED ORDER — METOPROLOL SUCCINATE ER 100 MG PO TB24
100.0000 mg | ORAL_TABLET | Freq: Every day | ORAL | Status: DC
  Administered 2020-11-21 – 2020-11-27 (×6): 100 mg via ORAL
  Filled 2020-11-19 (×7): qty 1

## 2020-11-19 MED ORDER — HYDROXYZINE HCL 25 MG PO TABS
25.0000 mg | ORAL_TABLET | Freq: Every day | ORAL | Status: DC
  Administered 2020-11-21 – 2020-11-27 (×7): 25 mg via ORAL
  Filled 2020-11-19 (×8): qty 1

## 2020-11-19 MED ORDER — MUPIROCIN 2 % EX OINT
1.0000 "application " | TOPICAL_OINTMENT | Freq: Two times a day (BID) | CUTANEOUS | Status: DC
  Administered 2020-11-19 – 2020-11-27 (×13): 1 via TOPICAL
  Filled 2020-11-19 (×4): qty 22

## 2020-11-19 MED ORDER — MAGIC MOUTHWASH
5.0000 mL | Freq: Four times a day (QID) | ORAL | Status: DC | PRN
  Filled 2020-11-19: qty 5

## 2020-11-19 MED ORDER — FERRIC CITRATE 1 GM 210 MG(FE) PO TABS: 420.0000 mg | ORAL_TABLET | ORAL | Status: DC

## 2020-11-19 MED ORDER — METHOCARBAMOL 500 MG PO TABS: 500.0000 mg | ORAL_TABLET | Freq: Three times a day (TID) | ORAL | Status: DC | PRN

## 2020-11-19 MED ORDER — OXYCODONE-ACETAMINOPHEN 10-325 MG PO TABS: 1.0000 | ORAL_TABLET | Freq: Three times a day (TID) | ORAL | Status: DC | PRN

## 2020-11-19 MED ORDER — ROSUVASTATIN CALCIUM 20 MG PO TABS
40.0000 mg | ORAL_TABLET | Freq: Every day | ORAL | Status: DC
  Administered 2020-11-21 – 2020-11-27 (×7): 40 mg via ORAL
  Filled 2020-11-19 (×8): qty 2

## 2020-11-19 MED ORDER — NALOXONE HCL 4 MG/0.1ML NA LIQD: 1.0000 | NASAL | Status: DC | PRN

## 2020-11-19 MED ORDER — DARBEPOETIN ALFA 60 MCG/0.3ML IJ SOSY
60.0000 ug | PREFILLED_SYRINGE | INTRAMUSCULAR | Status: DC
  Administered 2020-11-19 – 2020-11-27 (×2): 60 ug via INTRAVENOUS
  Filled 2020-11-19 (×3): qty 0.3

## 2020-11-19 MED ORDER — ALBUTEROL SULFATE (2.5 MG/3ML) 0.083% IN NEBU
2.5000 mg | INHALATION_SOLUTION | Freq: Four times a day (QID) | RESPIRATORY_TRACT | Status: DC | PRN
  Administered 2020-11-20 – 2020-11-25 (×4): 2.5 mg via RESPIRATORY_TRACT
  Filled 2020-11-19 (×4): qty 3

## 2020-11-19 MED ORDER — POLYETHYLENE GLYCOL 3350 17 G PO PACK: 17.0000 g | PACK | Freq: Every day | ORAL | Status: DC | PRN

## 2020-11-19 MED ORDER — ACETAMINOPHEN 325 MG PO TABS
650.0000 mg | ORAL_TABLET | Freq: Four times a day (QID) | ORAL | Status: DC | PRN
  Administered 2020-11-19: 650 mg via ORAL
  Filled 2020-11-19 (×2): qty 2

## 2020-11-19 MED ORDER — SODIUM CHLORIDE 0.9% FLUSH
10.0000 mL | Freq: Two times a day (BID) | INTRAVENOUS | Status: DC
Start: 2020-11-19 — End: 2020-11-19
  Administered 2020-11-19: 10 mL

## 2020-11-19 MED ORDER — RENA-VITE PO TABS
1.0000 | ORAL_TABLET | Freq: Every day | ORAL | Status: DC
  Administered 2020-11-19 – 2020-11-26 (×7): 1 via ORAL
  Filled 2020-11-19 (×6): qty 1

## 2020-11-19 NOTE — Progress Notes (Signed)
Pt receives out-pt HD at [facility] on MWF. Pt arrives at 10:25 for 10:45 chair time. Will assist as needed.  ? ?Shekelia Boutin ?Renal Navigator ?[phone] ?

## 2020-11-19 NOTE — H&P (Addendum)
Chief Complaint: Patient was seen in consultation today for fistulogram with possible angioplasty, thrombectomy or possible HD tunneled catheter placement at the request of Dr. Mickel Crow  Referring Physician(s): Dr. Mickel Crow  Supervising Physician: Juliet Rude  Patient Status: [facility] - In-pt  History of Present Illness: [name] is a 54 y.o. female with PMH of anemia, anxiety, CAD, CHF, dyspnea, ESRD on HD, HTN, mitral regurgitation and DM type II.  Patient is currently admitted with complaint of encephalopathy with intubation for airway protection 11/17/2020.  Patient was extubated on 11/18/2020. She went to scheduled hemodialysis earlier today (11/19/2020 ) and was not able to complete hemodialysis due to fistula malfunction.  HD nurse notes that patient's treatment was terminated early due to venous/arterial pressures being abnormal during the procedure.  Patient advised at that time that she had had previous issues with fistula and was supposed to get it checked but has not done so yet.  HD nurse also notes that there was difficulty cannulating AV sites today as well.  Patient was referred by Dr. Irena Cords for fistulogram with possible intervention such as angioplasty, thrombectomy or possible HD tunneled catheter placement.  Past Medical History:  Diagnosis Date   Anemia    Anxiety    Arthritis    "knees" "hands", "RA"   CAD (coronary artery disease)    Nonobstructive on CT 2019   CHF (congestive heart failure) ([place])    COVID-19 10/2018   Dyspnea    "when I have too much fluid."   ESRD (end stage renal disease) ([place])    Dialysis [facility]- 3rd st   Headache(784.0)    Heart murmur    High cholesterol    History of blood transfusion    Hypertension    Mitral regurgitation    moderate to severe MR 10/2018 echo   Nerve pain    "they say I have L5 nerve damage; my lumbar"   Pericardial effusion    Pneumonia    ; 11/16/2018- "touch of pneumonia" - seen in  ED- 11/14/2018- on antibiotic. Has had it x 2   PVD (peripheral vascular disease) ([place])    Right leg stent in [place].  (No records)   Restless legs    Sleep apnea    does not use Cpap   Thoracic ascending aortic aneurysm    4.4 cm 11/14/18 CTA   Type II diabetes mellitus [facility])     Past Surgical History:  Procedure Laterality Date   AMPUTATION Left 12/27/2018   Procedure: LEFT THUMB REVISION AMPUTATION DIGIT;  Surgeon: Charlotte Crumb, MD;  Location: [place];  Service: Orthopedics;  Laterality: Left;   AV FISTULA PLACEMENT Left    AV FISTULA PLACEMENT Right 03/12/2019   Procedure: INSERTION OF ARTERIOVENOUS (AV) GORE-TEX GRAFT ARM;  Surgeon: Elam Dutch, MD;  Location: [facility] OR;  Service: Vascular;  Laterality: Right;   AV FISTULA PLACEMENT Right 08/13/2019   Procedure: RIGHT UPPER ARM ARTERIOVENOUS (AV) FISTULA CREATION WITH BASILIC VEIN;  Surgeon: Elam Dutch, MD;  Location: [place];  Service: Vascular;  Laterality: Right;   [place]; 1997   x 2   COLONOSCOPY     EYE SURGERY Bilateral    cataract surgery   INSERTION OF DIALYSIS CATHETER Right 09/26/2018   Procedure: INSERTION OF DIALYSIS CATHETER Right Internal Jugular.;  Surgeon: Elam Dutch, MD;  Location: [place];  Service: Vascular;  Laterality: Right;   IR FLUORO GUIDE CV LINE RIGHT  05/04/2019  IR US GUIDE VASC ACCESS RIGHT  05/04/2019   LIGATION OF ARTERIOVENOUS  FISTULA Left 09/26/2018   Procedure: LIGATION OF ARTERIOVENOUS  FISTULA LEFT ARM;  Surgeon: Elam Dutch, MD;  Location: [place];  Service: Vascular;  Laterality: Left;   PERICARDIAL WINDOW  02/2004   for pericardial effusion   PERIPHERAL VASCULAR INTERVENTION Right 10/26/2019   Procedure: PERIPHERAL VASCULAR INTERVENTION;  Surgeon: Elam Dutch, MD;  Location: [place] CV LAB;  Service: Cardiovascular;  Laterality: Right;  arm  AV fistula   TUBAL LIGATION  05/1995   UPPER EXTREMITY VENOGRAPHY N/A 06/22/2019   Procedure: UPPER  EXTREMITY VENOGRAPHY - Right [place];  Surgeon: Elam Dutch, MD;  Location: [place] CV LAB;  Service: Cardiovascular;  Laterality: N/A;    Allergies: Bupropion  Medications: Prior to Admission medications   Medication Sig Start Date End Date Taking? Authorizing Provider  AURYXIA 1 GM 210 MG(Fe) tablet Take 420-840 mg by mouth See admin instructions. Take 4 tablets (840 mg) by mouth with each meal & take 2 tablets (420 mg) by mouth with each snack 05/29/18  Yes [provider]  Insulin Lispro Prot & Lispro (HUMALOG 75/25 MIX) (75-25) 100 UNIT/ML Kwikpen Inject 35 Units into the skin 2 (two) times daily with a meal. Patient taking differently: Inject 25-30 Units into the skin See admin instructions. 25 units in the morning and 30 units in the evening 05/01/20  Yes Renato Shin, MD  magic mouthwash SOLN Use as directed 5 mLs in the mouth or throat 4 (four) times daily as needed for mouth pain. 11/05/20  Yes [provider]  metoprolol succinate (TOPROL-XL) 100 MG 24 hr tablet Take 100 mg by mouth daily. Take with or immediately following a meal.   Yes [provider]  oxyCODONE-acetaminophen (PERCOCET) 10-325 MG tablet Take 1 tablet by mouth 3 (three) times daily as needed for pain. 11/04/20  Yes [provider]  pramipexole (MIRAPEX) 0.5 MG tablet Take 0.5 mg by mouth daily.  05/18/17  Yes [provider]  albuterol (VENTOLIN HFA) 108 (90 Base) MCG/ACT inhaler Inhale 2 puffs into the lungs every 6 (six) hours as needed for wheezing or shortness of breath. 02/22/20   Thurnell Lose, MD  b complex-vitamin c-folic acid (NEPHRO-VITE) 0.8 MG TABS tablet Take 1 tablet by mouth every Monday, Wednesday, and Friday with hemodialysis. 04/12/19   [provider]  calcitRIOL (ROCALTROL) 0.5 MCG capsule Take 0.5 mcg by mouth as directed. Given at Dialysis    [provider]  carbamide peroxide (DEBROX) 6.5 % OTIC solution Place 5 drops into  both ears 2 (two) times daily. 10/01/20   Domenic Moras, PA-C  glucose blood (ONETOUCH VERIO) test strip 1 each by Other route 2 (two) times daily. And lancets 2/day 04/10/20   Renato Shin, MD  HUMIRA PEN 40 MG/0.4ML PNKT Inject 40 mg into the skin every 14 (fourteen) days. 08/22/20   [provider]  hydrOXYzine (ATARAX/VISTARIL) 25 MG tablet Take 25 mg by mouth daily.    [provider]  lidocaine (LIDODERM) 5 % Place 2 patches onto the skin daily. Remove & Discard patch within 12 hours or as directed by MD 11/12/20   Redwine, Madison A, PA-C  methocarbamol (ROBAXIN) 500 MG tablet Take 1 tablet (500 mg total) by mouth every 8 (eight) hours as needed for muscle spasms. Patient not taking: Reported on 11/18/2020 04/25/20   Lucrezia Starch, MD  Methoxy PEG-Epoetin Beta (MIRCERA IJ) Mircera 09/01/19  [provider]  mupirocin ointment (BACTROBAN) 2 % Apply 1 application topically 2 (two) times daily. 11/06/20   McDonald, Stephan Minister, DPM  NARCAN 4 MG/0.1ML LIQD nasal spray kit Place 1 spray into the nose as needed (accidental overdose.).  10/10/19   [provider]  rosuvastatin (CRESTOR) 40 MG tablet TAKE 1 TABLET BY MOUTH DAILY *PATIENT NEEDS APPOINTMENT* Patient taking differently: No sig reported 11/13/20   Minus Breeding, MD     Family History  Problem Relation Age of Onset   Cancer Brother    Heart disease Father        Died age 19   Diabetes Father    Hyperlipidemia Father    Hypertension Father    Stroke Father    Diabetes Mother    Hypertension Mother    Stroke Mother    Dementia Mother    Diabetes Sister    Diabetes Brother    Hypertension Sister    Hypertension Brother     Social History   Socioeconomic History   Marital status: Married    Spouse name: Not on file   Number of children: Not on file   Years of education: Not on file   Highest education level: Not on file  Occupational History   Not on file  Tobacco Use   Smoking status:  Former    Years: 33.00    Types: Cigarettes    Quit date: 07/17/2019    Years since quitting: 1.3   Smokeless tobacco: Never   Tobacco comments:    using NIcotene gum, rare 1/2 cigarette  Vaping Use   Vaping Use: Never used  Substance and Sexual Activity   Alcohol use: No   Drug use: No   Sexual activity: Not Currently    Birth control/protection: Post-menopausal  Other Topics Concern   Not on file  Social History Narrative   Lives with son.        Social Determinants of Health   Financial Resource Strain: Not on file  Food Insecurity: Not on file  Transportation Needs: Not on file  Physical Activity: Not on file  Stress: Not on file  Social Connections: Not on file     Review of Systems: A 12 point ROS discussed and pertinent positives are indicated in the HPI above.  All other systems are negative.  Review of Systems  Constitutional:  Negative for appetite change, chills and fever.  HENT:  Negative for nosebleeds.   Respiratory:  Negative for cough and shortness of breath.   Cardiovascular:  Negative for chest pain and leg swelling.  Gastrointestinal:  Negative for abdominal pain, blood in stool, nausea and vomiting.  Neurological:  Negative for dizziness, light-headedness and headaches.   Vital Signs: BP 119/60   Pulse 93   Temp 98.7 F (37.1 C) (Oral)   Resp (!) 25   Wt 245 lb 13 oz (111.5 kg)   LMP  (LMP Unknown) Comment: LMP Feb 2011  SpO2 95%   BMI 37.38 kg/m   Physical Exam Constitutional:      Appearance: She is ill-appearing.  HENT:     Head: Normocephalic and atraumatic.     Mouth/Throat:     Mouth: Mucous membranes are moist.     Pharynx: Oropharynx is clear.  Cardiovascular:     Rate and Rhythm: Normal rate and regular rhythm.     Pulses: Normal pulses.     Heart sounds: No murmur heard.   No gallop.  Pulmonary:  Effort: Pulmonary effort is normal. No respiratory distress.     Breath sounds: No stridor. Rhonchi present. No wheezing  or rales.     Comments: Rhonchi throughout all fields Pt on 4L O2 [place] Abdominal:     General: There is distension.     Palpations: Abdomen is soft.     Tenderness: There is no abdominal tenderness. There is no guarding.  Musculoskeletal:     Right lower leg: No edema.     Left lower leg: No edema.  Skin:    General: Skin is warm and dry.     Comments: AV fistula to RUE-bruit on auscultation  Neurological:     Mental Status: She is alert and oriented to person, place, and time.  Psychiatric:        Mood and Affect: Mood normal.        Behavior: Behavior normal.        Thought Content: Thought content normal.        Judgment: Judgment normal.    Imaging: DG Shoulder Right  Result Date: 11/12/2020 CLINICAL DATA:  Fall, shoulder pain EXAM: RIGHT SHOULDER - 2+ VIEW COMPARISON:  Shoulder radiographs 06/05/2019 FINDINGS: There is no acute fracture or dislocation. Shoulder alignment is maintained. Degenerative changes about the [facility] joint are similar to the prior study. A right arm vascular stent and vascular calcifications are noted. IMPRESSION: No acute fracture or dislocation. Electronically Signed   By: Valetta Mole M.D.   On: 11/12/2020 16:37   DG Knee 1-2 Views Right  Result Date: 11/18/2020 CLINICAL DATA:  Pain EXAM: RIGHT KNEE - 1-2 VIEW COMPARISON:  None. 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 Prepatellar soft tissue swelling. Possible small joint effusion. Regional arterial calcification is noted. No acute bone finding. 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 Small joint effusion. Prepatellar soft tissue swelling. Electronically Signed   By: Nelson Chimes M.D.   On: 11/18/2020 13:12   CT Head Wo Contrast  Result Date: 11/17/2020 CLINICAL DATA:  Mental status changes of unknown cause EXAM: CT HEAD WITHOUT CONTRAST TECHNIQUE: Contiguous axial images were obtained from the base of the  skull through the vertex without intravenous contrast. COMPARISON:  04/23/2020 FINDINGS: Brain: Age related volume loss. There are chronic areas of low-density within the deep white matter including the corpus callosum. Findings could relate to chronic multiple sclerosis or small vessel disease. No evidence of recent infarction, mass lesion, hemorrhage, hydrocephalus or extra-axial collection. Vascular: There is atherosclerotic calcification of the major vessels at the base of the brain. Skull: Negative Sinuses/Orbits: Mild mucosal inflammation of the sinuses. Orbits negative. Other: None IMPRESSION: Chronic appearing white matter insults affecting both cerebral hemispheres which could be due to chronic small vessel disease and or demyelinating disease. Given the vascular risk factors, this may simply represent atherosclerotic vascular disease. No sign of acute CT abnormality. Electronically Signed   By: Nelson Chimes M.D.   On: 11/17/2020 19:49   DG Chest Port 1 View  Result Date: 11/17/2020 CLINICAL DATA:  Post intubation EXAM: PORTABLE CHEST 1 VIEW COMPARISON:  02/25/2020 FINDINGS: Endotracheal tube tip is about 3 cm superior to the carina. Esophageal tube tip below the diaphragm but incompletely visualized. Cardiomegaly with vascular congestion and probable pulmonary edema. More confluent ground-glass opacity at the right apex and patchy consolidations at the bases. Widened mediastinal silhouette probably due to portable technique and lordotic positioning. IMPRESSION: 1. Endotracheal tube tip about 3 cm superior to carina 2. Cardiomegaly  with vascular congestion and probable pulmonary edema. 3. More focal areas of consolidation and ground-glass opacity at the right apex and both bases suspicious for superimposed pneumonia 4. Enlarged mediastinal silhouette, likely augmented by portable technique and lordotic view. Electronically Signed   By: Donavan Foil M.D.   On: 11/17/2020 17:40   DG Knee Complete 4  Views Left  Result Date: 11/17/2020 CLINICAL DATA:  Right knee pain EXAM: LEFT KNEE - COMPLETE 4+ VIEW; DG HIP (WITH OR WITHOUT PELVIS) 2-3V RIGHT COMPARISON:  None. FINDINGS: No evidence of fracture or dislocation. Mild degenerative changes of the right hip. Mild-to-moderate degenerative changes of the bilateral SI joints, evaluation somewhat limited due to overlying bowel gas. Limited evaluation of the sacrum and left iliac wing. Calcification in the pelvis, likely calcified fibroid. Single AP view of the left hip demonstrates mild degenerative changes in no acute osseous abnormality. Vascular calcifications. No acute fracture or dislocation of the left knee. Moderate left knee joint effusion. Moderate tricompartmental degenerative changes. Vascular calcifications. IMPRESSION: No acute osseous abnormality. Moderate left knee joint effusion. Electronically Signed   By: Yetta Glassman M.D.   On: 11/17/2020 14:12   DG Hand Complete Right  Result Date: 11/12/2020 CLINICAL DATA:  Fall, hand swelling EXAM: RIGHT HAND - COMPLETE 3+ VIEW COMPARISON:  None. FINDINGS: There is no acute fracture or dislocation. Bony alignment is normal. There is mild degenerative change at the index finger CMC joint likely reflecting osteoarthritis. There is diffuse soft tissue swelling about the hand. There is no soft tissue gas. There is no radiopaque foreign body. IMPRESSION: 1. Diffuse soft tissue swelling about the hand; no soft tissue gas or radiopaque foreign body. 2. No acute osseous abnormality. 3. Mild degenerative changes at the index finger CMC joint. Electronically Signed   By: Valetta Mole M.D.   On: 11/12/2020 16:40   DG Hip Unilat With Pelvis 2-3 Views Right  Result Date: 11/17/2020 CLINICAL DATA:  Right knee pain EXAM: LEFT KNEE - COMPLETE 4+ VIEW; DG HIP (WITH OR WITHOUT PELVIS) 2-3V RIGHT COMPARISON:  None. FINDINGS: No evidence of fracture or dislocation. Mild degenerative changes of the right hip.  Mild-to-moderate degenerative changes of the bilateral SI joints, evaluation somewhat limited due to overlying bowel gas. Limited evaluation of the sacrum and left iliac wing. Calcification in the pelvis, likely calcified fibroid. Single AP view of the left hip demonstrates mild degenerative changes in no acute osseous abnormality. Vascular calcifications. No acute fracture or dislocation of the left knee. Moderate left knee joint effusion. Moderate tricompartmental degenerative changes. Vascular calcifications. IMPRESSION: No acute osseous abnormality. Moderate left knee joint effusion. Electronically Signed   By: Yetta Glassman M.D.   On: 11/17/2020 14:12   SLEEP STUDY DOCUMENTS  Result Date: 10/27/2020 Ordered by an unspecified provider.   Labs:  CBC: Recent Labs    11/12/20 1517 11/17/20 1700 11/17/20 2005 11/18/20 0402 11/18/20 0403 11/18/20 2045 11/19/20 0242  WBC 8.6 8.3  --   --  7.1  --  8.6  HGB 9.6* 8.4*   < > 10.5* 7.4* 8.2* 7.0*  HCT 30.1* 27.3*   < > 31.0* 23.7* 24.0* 22.9*  PLT 240 264  --   --  226  --  199   < > = values in this interval not displayed.    COAGS: No results for input(s): INR, APTT in the last 8760 hours.  BMP: Recent Labs    11/12/20 1517 11/17/20 1700 11/17/20 2005 11/18/20 0402 11/18/20 0403 11/18/20  2045 11/19/20 0242  NA 133* 135   < > 136 136 136 136  K 4.8 4.7   < > 4.3 4.2 4.6 4.4  CL 91* 97*  --   --  94*  --  94*  CO2 24 28  --   --  28  --  28  GLUCOSE 226* 310*  --   --  224*  --  99  BUN 47* 27*  --   --  34*  --  44*  CALCIUM 9.2 7.8*  --   --  8.2*  --  8.3*  CREATININE 8.14* 5.23*  --   --  6.10*  --  7.69*  GFRNONAA 5* 9*  --   --  8*  --  6*   < > = values in this interval not displayed.    LIVER FUNCTION TESTS: Recent Labs    02/28/20 0612 03/27/20 1408 11/12/20 1517 11/17/20 1700  BILITOT 0.6 0.3 0.8 0.7  AST 14* 14* 14* 14*  ALT 17 15 13 14   ALKPHOS 73 136* 149* 144*  PROT 6.9 8.1 8.5* 7.3  ALBUMIN  3.2* 3.6 3.0* 2.6*    TUMOR MARKERS: No results for input(s): AFPTM, CEA, CA199, CHROMGRNA in the last 8760 hours.  Assessment and Plan: History of anemia, anxiety, CAD, CHF, dyspnea, ESRD on HD, HTN, mitral regurgitation and DM type II.  Patient is currently admitted with complaint of encephalopathy with intubation for airway protection 11/17/2020.  Patient was extubated on 11/18/2020. She went to scheduled hemodialysis earlier today (11/19/2020 ) and was not able to complete hemodialysis due to fistula malfunction. HD nurse notes that patient's treatment was terminated early due to venous/arterial pressures being abnormal during the procedure.  Patient advised at that time that she had had previous issues with fistula and was supposed to get it checked but has not done so yet.  HD nurse also notes that there was difficulty cannulating AV sites today as well.  Patient was referred by Dr. Irena Cords for fistulogram with possible intervention such as angioplasty, thrombectomy or possible HD tunneled catheter placement.   Patient resting quietly in bed.  She is alert and oriented x4 but drowsy.     She arouses easily to verbal stimuli answers questions appropriately.  Patient denies pain, shortness of breath, chest pain, fevers or nausea vomiting. She is in no distress.   Patient advised she will be n.p.o. after midnight tonight for procedure tentatively scheduled for 11/20/2020.   WBC within normal limits Hgb 7.0 11/19/2020 VSS  Risks and benefits discussed with the patient including, but not limited to bleeding, infection, vascular injury, pulmonary embolism, need for tunneled HD catheter placement or even death.  All of the patient's questions were answered, patient is agreeable to proceed. Consent signed and in chart.   Thank you for this interesting consult.  I greatly enjoyed meeting [name] and look forward to participating in their care.  A copy of this report was sent to  the requesting provider on this date.  Electronically Signed: Tyson Alias, NP 11/19/2020, 1:55 PM   I spent a total of 30 minutes in face to face in clinical consultation, greater than 50% of which was counseling/coordinating care for fistulogram with possible angioplasty, thrombectomy, or possible tunneled catheter placement.

## 2020-11-19 NOTE — Progress Notes (Signed)
   11/19/20 1130  Vitals  Temp 98.9 F (37.2 C)  Temp Source Oral  BP (!) 110/42  BP Location Left Arm  BP Method Automatic  Patient Position (if appropriate) Lying  Pulse Rate 95  Pulse Rate Source Monitor  ECG Heart Rate 94  Resp (!) 22  Oxygen Therapy  SpO2 98 %  O2 Device Nasal Cannula  O2 Flow Rate (L/min) 4 L/min  Post-Hemodialysis Assessment  Rinseback Volume (mL) 250 mL  KECN 224 V  Dialyzer Clearance Lightly streaked  Duration of HD Treatment -hour(s) 245 hour(s)  Hemodialysis Intake (mL) 1000 mL  UF Total -Machine (mL) 1349 mL  Net UF (mL) 349 mL  Tolerated HD Treatment No (Comment) (see note)  AVG/AVF Arterial Site Held (minutes) 10 minutes  AVG/AVF Venous Site Held (minutes) 5 minutes  Fistula / Graft Right Upper arm Arteriovenous fistula  Placement Date/Time: 08/13/19 1023   Placed prior to admission: No  Orientation: Right  Access Location: Upper arm  Access Type: (c) Arteriovenous fistula  Site Condition No complications  Fistula / Graft Assessment Present;Thrill;Bruit  Status Deaccessed  HD terminated with 44 minutes left. Delay noted at start of tx due to cannulation issues, Cannulation performed x2 both arterial and venous sites, two dialysis staff members. Fistula positive for thrill and bruit. However within 10 minutes of initiation of tx, venous pressures noted to be abnormally running btwn 40-60 with BFR at 400 and arterial pressures initally noted abnormally low at -30 and then noted to be fluctuating btwn abnormally low pressures to high pressures. Pt then noted with clotted arterial line within next 5 minutes of trouble shooting. Pt rinsed back and tx reinitiated. However, arterial pressures remain abnormally low and high fluctuating btwn the two. BFR decreased and pt ran for approximately 2hr and 15 minutes and then eventually unable to continue to tx. Pt reports issues with her fistula and was supposed to get it "looked at but I haven't went yet".  Dr.Schertz notified and states pt will need to get a fistulagram.

## 2020-11-19 NOTE — Progress Notes (Signed)
[facility] Progress Note  Subjective: I/O 1.2 L in and nothing out yesterday. ABG shows CO2 retention at 61. Pt is extubated and alert on [place] O2. Not in distress, seen in ICU, begging to have lunch. Is on HD now  Vitals:   11/19/20 0645 11/19/20 0700 11/19/20 0800 11/19/20 0836  BP: (!) 99/53 129/60 (!) 118/59   Pulse: 95 (!) 112 90   Resp: 20 (!) 22 (!) 23   Temp:    98.4 F (36.9 C)  TempSrc:    Oral  SpO2: 95% (!) 71% 99%   Weight:        Exam:   alert, nad   no jvd  Chest cta bilat  Cor reg no RG  Abd soft ntnd no ascites   Ext no LE edema   Alert, NF, ox3    RUA AVF+bruit  Home meds include - humalog 75/25 mix 25 u bid, auryxia 4 ac tid, toprol xl 100, percocet prn, mirapex , nephrovite, humira, robaxin prn, narcan prn, crestor, prns   OP HD: G-O MWF  4h  450/800   111.5kg  2/2 bath  15ga  RUA AVF Hep 10,000  - calc 1.0 ug tiw   - parsabiv 5 mg IV tiw   Assessment/ Plan: Resp failure - CXR large bibasilar infiltrates, suspected PNA. Getting IV abx doxy and Rocephin. Was on vent but is extubated now on [place] O2, in no distress. Hypotension - resolved; on low dose pressors at time of admit, off now.  ESRD - on HD MWF.  HD today.  BP/volume - holding home BP meds, BP's soft. 2kg up , doubt pulm edema. UF 1-2 L today as tolerated.  MBD ckd - resume meds when eating ANemia ckd - not getting esa at OP. Hb down here 8.5 >> 7.0 today. Transfuse prn. Will give esa starting today darbe 60 ug q wed.  IDDM -  per pmd     Rob Christyna Letendre 11/19/2020, 9:01 AM   Recent Labs  Lab 11/18/20 0403 11/18/20 2045 11/19/20 0242  K 4.2 4.6 4.4  BUN 34*  --  44*  CREATININE 6.10*  --  7.69*  CALCIUM 8.2*  --  8.3*  HGB 7.4* 8.2* 7.0*   Inpatient medications:  chlorhexidine  15 mL Mouth Rinse BID   Chlorhexidine Gluconate Cloth  6 each Topical Daily   heparin  5,000 Units Subcutaneous Q8H   insulin aspart  0-15 Units Subcutaneous Q4H   LORazepam  2 mg  Intramuscular Once   mouth rinse  15 mL Mouth Rinse q12n4p   sodium chloride flush  10-40 mL Intracatheter Q12H    sodium chloride Stopped (11/19/20 0155)   cefTRIAXone (ROCEPHIN)  IV Stopped (11/18/20 2309)   doxycycline (VIBRAMYCIN) IV Stopped (11/19/20 0132)   famotidine (PEPCID) IV Stopped (11/18/20 2157)   docusate sodium, heparin, influenza vac split quadrivalent PF, LORazepam, polyethylene glycol, sodium chloride flush

## 2020-11-19 NOTE — Progress Notes (Signed)
NAME:  [name], MRN:  [id number], DOB:  1965-12-16, LOS: 2 ADMISSION DATE:  11/17/2020, CONSULTATION DATE:  11/17/20 REFERRING MD:  EDP CHIEF COMPLAINT:  encephalopathy  History of Present Illness:  [name] is a 55 year old woman with ESRD on HD who presented with R knee pain after a fall on 10/14, was in ED since 1040 am. Was unable to complete HD today due to pain.  Last oxycodone 10/16 pm.   Yesterday had knee pain but was able to extend the knee.  In the ED she was given dilaudid 35m at 1245 . Later this afternoon, developed period of apnea, low blood pressure and hypoxia.   Did not respond to narcan. Was intubated for airway protection.  Remained obtunded.    Takes oxycodone 361mdaily for chronic pain  IN ED: dilaudid 38m26m245, zofran 4mg25m 7.348/59.6/79 (2000) AP 144  Ca 7.8 alb 2.6  D dimer  2.62 Covid pending Blood culture pending.  Procal pending.  Pertinent  Medical History  CAD CHF Covid 19 hx  ESRD on HD  HLD HTN MR (severe) Thoracic aortic aneurism  DM2  PVD  OSA (not on cpap)  Home meds: albuterol, auryxia, calcitriol, insulin, hydroxyzine 25mg58mly, insulin, roxabin, metoprolol, narcan prn, oxycodone 10mg 45m mirapex, prednisone, crestor, humira  Significant Hospital Events: Including procedures, antibiotic start and stop dates in addition to other pertinent events   10/17 intubated, started on levophed and admitted to the ICU 10/18 extubated. Required BiPAP overnight for CO2 retention  Interim History / Subjective:  Overnight required BiPAP for CO2 retention. No other events.  On HD this morning.  No acute complaints just requesting breakfast.  She does not recall being intubated.  Also complaining of pain in her left jaw, she had an appointment with oral surgery yesterday.  Objective   Blood pressure (!) 99/53, pulse 95, temperature 99.2 F (37.3 C), temperature source Oral, resp. rate 20, weight 111.5 kg, SpO2 95 %.    Vent Mode:  BIPAP FiO2 (%):  [40 %] 40 % Set Rate:  [14 bmp] 14 bmp Vt Set:  [510 mL] 510 mL PEEP:  [5 cmH20] 5 cmH20 Pressure Support:  [8 cmH20] 8 cmH20 Plateau Pressure:  [14 cmH20-25 cmH20] 14 cmH20   Intake/Output Summary (Last 24 hours) at 11/19/2020 0723 Last data filed at 11/19/2020 0200 Gross per 24 hour  Intake 937.54 ml  Output --  Net 937.54 ml   Filed Weights   11/17/20 2339 11/18/20 0500 11/19/20 0241  Weight: 113.7 kg 113.7 kg 111.5 kg    Examination: General: ill appearing woman, awake, no acute distress on HD HENT: PERRLA, [place]/AT Lungs: bibasilar crackles, normal effort on supplemental oxygen Cardiovascular: RRR, no murmurs  Abdomen: soft, nondistended, non tender Extremities: tender to palpation of right knee, no swelling or edema Neuro: Awake, alert and oriented x3    Resolved Hospital Problem list   Acute hypoxic and hypercapnic respiratory failure Acute encephalopathy  Shock  Assessment & Plan:   Acute hypoxic and hypercapnic respiratory failure Iatrogenic narcotic overdose Acute toxic encephalopathy  Possible aspiration pneumonia  Secondary to narcotics, did not respond to narcan and required intubation and sedation. Extubated yesterday afternoon and required BiPAP overnight for CO2 retention.  Recommend avoiding opiates. Continue antibiotics, transition to orals today. Respiratory culture shows rare gram-positive cocci and rare gram variable rods. -Recommend continuing BiPAP nightly for OSA and to continue at discharge -Continue antibiotics, switch to orals today -Continue pulmonary  hygiene  Undifferentiated shock, resolved  Weaned off Levophed yesterday.  Blood cultures show no growth to date.  Continue antibiotics as above.  Knee pain Secondary to fall.  Right knee radiograph showed prepatellar soft tissue swelling, tricompartmental osteoarthritis and a small joint effusion.  Avoid opioids for pain control.  ESRD on HD Last HD on 10/17.  Electrolytes  stable. - Nephrology following, appreciate assistance  Acute on chronic anemia Hemoglobin 7, down from 8.2.  Monitor for source of bleed. -Trend CBC -Transfuse for hemoglobin less than 7  Type 2 diabetes Home medication includes insulin 75/25 mix 35 units twice daily. -Blood glucose goal 140-180 -SSI  Rheumatoid arthritis Resume home medications Humira and prednisone once completed antibiotic course  Best Practice (right click and "Reselect all SmartList Selections" daily)   Diet/type: NPO, SLP eval pending  DVT prophylaxis: prophylactic heparin  GI prophylaxis: N/A Lines: Central line and Dialysis Catheter Foley:  N/A Code Status:  full code Last date of multidisciplinary goals of care discussion [10/19]  Labs   CBC: Recent Labs  Lab 11/12/20 1517 11/17/20 1700 11/17/20 2005 11/18/20 0402 11/18/20 0403 11/18/20 2045 11/19/20 0242  WBC 8.6 8.3  --   --  7.1  --  8.6  NEUTROABS 6.7 6.7  --   --   --   --  6.8  HGB 9.6* 8.4* 8.5* 10.5* 7.4* 8.2* 7.0*  HCT 30.1* 27.3* 25.0* 31.0* 23.7* 24.0* 22.9*  MCV 98.4 101.5*  --   --  98.3  --  100.4*  PLT 240 264  --   --  226  --  372    Basic Metabolic Panel: Recent Labs  Lab 11/12/20 1517 11/17/20 1700 11/17/20 2005 11/18/20 0402 11/18/20 0403 11/18/20 2045 11/19/20 0242  NA 133* 135 133* 136 136 136 136  K 4.8 4.7 4.6 4.3 4.2 4.6 4.4  CL 91* 97*  --   --  94*  --  94*  CO2 24 28  --   --  28  --  28  GLUCOSE 226* 310*  --   --  224*  --  99  BUN 47* 27*  --   --  34*  --  44*  CREATININE 8.14* 5.23*  --   --  6.10*  --  7.69*  CALCIUM 9.2 7.8*  --   --  8.2*  --  8.3*  MG  --   --   --   --   --   --  2.0   GFR: Estimated Creatinine Clearance: 10.7 mL/min (A) (by C-G formula based on SCr of 7.69 mg/dL (H)). Recent Labs  Lab 11/12/20 1517 11/17/20 1700 11/17/20 2224 11/18/20 0035 11/18/20 0403 11/19/20 0242  PROCALCITON  --   --  0.67  --   --   --   WBC 8.6 8.3  --   --  7.1 8.6  LATICACIDVEN  --    --   --  1.1  --   --     Liver Function Tests: Recent Labs  Lab 11/12/20 1517 11/17/20 1700  AST 14* 14*  ALT 13 14  ALKPHOS 149* 144*  BILITOT 0.8 0.7  PROT 8.5* 7.3  ALBUMIN 3.0* 2.6*   No results for input(s): LIPASE, AMYLASE in the last 168 hours. No results for input(s): AMMONIA in the last 168 hours.  ABG    Component Value Date/Time   PHART 7.326 (L) 11/18/2020 2045   PCO2ART 61.1 (H) 11/18/2020 2045   PO2ART  76 (L) 11/18/2020 2045   HCO3 31.6 (H) 11/18/2020 2045   TCO2 33 (H) 11/18/2020 2045   O2SAT 93.0 11/18/2020 2045     Coagulation Profile: No results for input(s): INR, PROTIME in the last 168 hours.  Cardiac Enzymes: No results for input(s): CKTOTAL, CKMB, CKMBINDEX, TROPONINI in the last 168 hours.  HbA1C: Hemoglobin A1C  Date/Time Value Ref Range Status  05/01/2020 08:28 AM 9.7 (A) 4.0 - 5.6 % Final   Hgb A1c MFr Bld  Date/Time Value Ref Range Status  02/18/2020 07:06 PM 8.5 (H) 4.8 - 5.6 % Final    Comment:    (NOTE) Pre diabetes:          5.7%-6.4%  Diabetes:              >6.4%  Glycemic control for   <7.0% adults with diabetes   09/19/2018 08:33 AM 8.7 (H) 4.8 - 5.6 % Final    Comment:    (NOTE) Pre diabetes:          5.7%-6.4% Diabetes:              >6.4% Glycemic control for   <7.0% adults with diabetes     CBG: Recent Labs  Lab 11/18/20 1155 11/18/20 1541 11/18/20 1931 11/18/20 2331 11/19/20 0324  GLUCAP 145* 181* 210* 107* 94    Review of Systems:     Past Medical History:  She,  has a past medical history of Anemia, Anxiety, Arthritis, CAD (coronary artery disease), CHF (congestive heart failure) ([place]), COVID-19 (10/2018), Dyspnea, ESRD (end stage renal disease) ([place]), Headache(784.0), Heart murmur, High cholesterol, History of blood transfusion, Hypertension, Mitral regurgitation, Nerve pain, Pericardial effusion, Pneumonia, PVD (peripheral vascular disease) ([place]), Restless legs, Sleep apnea, Thoracic ascending  aortic aneurysm, and Type II diabetes mellitus ([place]).   Surgical History:   Past Surgical History:  Procedure Laterality Date   AMPUTATION Left 12/27/2018   Procedure: LEFT THUMB REVISION AMPUTATION DIGIT;  Surgeon: Charlotte Crumb, MD;  Location: [facility];  Service: Orthopedics;  Laterality: Left;   AV FISTULA PLACEMENT Left    AV FISTULA PLACEMENT Right 03/12/2019   Procedure: INSERTION OF ARTERIOVENOUS (AV) GORE-TEX GRAFT ARM;  Surgeon: Elam Dutch, MD;  Location: [facility];  Service: Vascular;  Laterality: Right;   AV FISTULA PLACEMENT Right 08/13/2019   Procedure: RIGHT UPPER ARM ARTERIOVENOUS (AV) FISTULA CREATION WITH BASILIC VEIN;  Surgeon: Elam Dutch, MD;  Location: [facility];  Service: Vascular;  Laterality: Right;   [place]; 1997   x 2   COLONOSCOPY     EYE SURGERY Bilateral    cataract surgery   INSERTION OF DIALYSIS CATHETER Right 09/26/2018   Procedure: INSERTION OF DIALYSIS CATHETER Right Internal Jugular.;  Surgeon: Elam Dutch, MD;  Location: [facility];  Service: Vascular;  Laterality: Right;   IR FLUORO GUIDE CV LINE RIGHT  05/04/2019   IR US GUIDE VASC ACCESS RIGHT  05/04/2019   LIGATION OF ARTERIOVENOUS  FISTULA Left 09/26/2018   Procedure: LIGATION OF ARTERIOVENOUS  FISTULA LEFT ARM;  Surgeon: Elam Dutch, MD;  Location: [facility];  Service: Vascular;  Laterality: Left;   PERICARDIAL WINDOW  02/2004   for pericardial effusion   PERIPHERAL VASCULAR INTERVENTION Right 10/26/2019   Procedure: PERIPHERAL VASCULAR INTERVENTION;  Surgeon: Elam Dutch, MD;  Location: [facility];  Service: Cardiovascular;  Laterality: Right;  arm  AV fistula   TUBAL LIGATION  05/1995   UPPER EXTREMITY VENOGRAPHY N/A  06/22/2019   Procedure: UPPER EXTREMITY VENOGRAPHY - Right Central;  Surgeon: Elam Dutch, MD;  Location: [facility];  Service: Cardiovascular;  Laterality: N/A;     Social History:   reports that she quit smoking about 16 months ago.  Her smoking use included cigarettes. She has never used smokeless tobacco. She reports that she does not drink alcohol and does not use drugs.   Family History:  Her family history includes Cancer in her brother; Dementia in her mother; Diabetes in her brother, father, mother, and sister; Heart disease in her father; Hyperlipidemia in her father; Hypertension in her brother, father, mother, and sister; Stroke in her father and mother.   Allergies Allergies  Allergen Reactions   Bupropion Itching     Home Medications  Prior to Admission medications   Medication Sig Start Date End Date Taking? Authorizing Provider  albuterol (VENTOLIN HFA) 108 (90 Base) MCG/ACT inhaler Inhale 2 puffs into the lungs every 6 (six) hours as needed for wheezing or shortness of breath. 02/22/20   Thurnell Lose, MD  AURYXIA 1 GM 210 MG(Fe) tablet Take 630-1,050 mg by mouth See admin instructions. Take 5 tablets (1050 mg) by mouth with each meal & take 3 tablets (630 mg) by mouth with each snack 05/29/18   [provider]  b complex-vitamin c-folic acid (NEPHRO-VITE) 0.8 MG TABS tablet Take 1 tablet by mouth every Monday, Wednesday, and Friday with hemodialysis. 04/12/19   [provider]  calcitRIOL (ROCALTROL) 0.5 MCG capsule Take 0.5 mcg by mouth as directed. Given at Dialysis    [provider]  carbamide peroxide (DEBROX) 6.5 % OTIC solution Place 5 drops into both ears 2 (two) times daily. 10/01/20   Domenic Moras, PA-C  glucose blood (ONETOUCH VERIO) test strip 1 each by Other route 2 (two) times daily. And lancets 2/day 04/10/20   Renato Shin, MD  HUMIRA PEN 40 MG/0.4ML PNKT SMARTSIG:40 Milligram(s) SUB-Q Every 2 Weeks 08/22/20   [provider]  hydrOXYzine (ATARAX/VISTARIL) 25 MG tablet Take 25 mg by mouth daily.    [provider]  Insulin Lispro Prot & Lispro (HUMALOG 75/25 MIX) (75-25) 100 UNIT/ML Kwikpen Inject 35 Units into the skin 2 (two) times daily with a  meal. 05/01/20   Renato Shin, MD  lidocaine (LIDODERM) 5 % Place 2 patches onto the skin daily. Remove & Discard patch within 12 hours or as directed by MD 11/12/20   Redwine, Madison A, PA-C  methocarbamol (ROBAXIN) 500 MG tablet Take 1 tablet (500 mg total) by mouth every 8 (eight) hours as needed for muscle spasms. 04/25/20   Lucrezia Starch, MD  Methoxy PEG-Epoetin Beta (MIRCERA IJ) Mircera 09/01/19   [provider]  metoprolol succinate (TOPROL-XL) 100 MG 24 hr tablet Take 100 mg by mouth daily. Take with or immediately following a meal.    [provider]  mupirocin ointment (BACTROBAN) 2 % Apply 1 application topically 2 (two) times daily. 11/06/20   McDonald, Stephan Minister, DPM  NARCAN 4 MG/0.1ML LIQD nasal spray kit Place 1 spray into the nose as needed (accidental overdose.).  10/10/19   [provider]  Oxycodone HCl 10 MG TABS Take 10 mg by mouth 3 (three) times daily. 02/08/20   [provider]  pramipexole (MIRAPEX) 0.5 MG tablet Take 0.5 mg by mouth daily.  05/18/17   [provider]  predniSONE (DELTASONE) 10 MG tablet TAKE 4 TABLETS (40 MG TOTAL) BY MOUTH DAILY WITH BREAKFAST  FOR 4 DAYS. 02/28/20 02/27/21  Donne Hazel, MD  rosuvastatin (CRESTOR) 40 MG tablet TAKE 1 TABLET BY MOUTH DAILY *PATIENT NEEDS APPOINTMENT* 11/13/20   Minus Breeding, MD    Denisse Whitenack, DO PGY-3 IMTS

## 2020-11-19 NOTE — Progress Notes (Signed)
Report called to [facility] progressive bed 6. Writer spoke to [employer] receiving patient. Patient able to transfer in wheelchair. Vitals stable. Respiratory aware to transfer patients Bipap. Will prepare patient for transfer.

## 2020-11-19 NOTE — Evaluation (Signed)
Clinical/Bedside Swallow Evaluation Patient Details  Name: [name] MRN: [id number] Date of Birth: Apr 09, 1965  Today's Date: 11/19/2020 Time: SLP Start Time (ACUTE ONLY): 20 SLP Stop Time (ACUTE ONLY): 1046 SLP Time Calculation (min) (ACUTE ONLY): 16 min  Past Medical History:  Past Medical History:  Diagnosis Date   Anemia    Anxiety    Arthritis    "knees" "hands", "RA"   CAD (coronary artery disease)    Nonobstructive on CT 2019   CHF (congestive heart failure) ([place])    COVID-19 10/2018   Dyspnea    "when I have too much fluid."   ESRD (end stage renal disease) ([place])    Dialysis [facility]- [address] [facility]   Headache(784.0)    Heart murmur    High cholesterol    History of blood transfusion    Hypertension    Mitral regurgitation    moderate to severe MR 10/2018 echo   Nerve pain    "they say I have L5 nerve damage; my lumbar"   Pericardial effusion    Pneumonia    ; 11/16/2018- "touch of pneumonia" - seen in ED- 11/14/2018- on antibiotic. Has had it x 2   PVD (peripheral vascular disease) ([place])    Right leg stent in [place].  (No records)   Restless legs    Sleep apnea    does not use Cpap   Thoracic ascending aortic aneurysm    4.4 cm 11/14/18 CTA   Type II diabetes mellitus [facility])    Past Surgical History:  Past Surgical History:  Procedure Laterality Date   AMPUTATION Left 12/27/2018   Procedure: LEFT THUMB REVISION AMPUTATION DIGIT;  Surgeon: Charlotte Crumb, MD;  Location: [facility];  Service: Orthopedics;  Laterality: Left;   AV FISTULA PLACEMENT Left    AV FISTULA PLACEMENT Right 03/12/2019   Procedure: INSERTION OF ARTERIOVENOUS (AV) GORE-TEX GRAFT ARM;  Surgeon: Elam Dutch, MD;  Location: [facility];  Service: Vascular;  Laterality: Right;   AV FISTULA PLACEMENT Right 08/13/2019   Procedure: RIGHT UPPER ARM ARTERIOVENOUS (AV) FISTULA CREATION WITH BASILIC VEIN;  Surgeon: Elam Dutch, MD;  Location: [facility];  Service: Vascular;  Laterality: Right;    [place]; 1997   x 2   COLONOSCOPY     EYE SURGERY Bilateral    cataract surgery   INSERTION OF DIALYSIS CATHETER Right 09/26/2018   Procedure: INSERTION OF DIALYSIS CATHETER Right Internal Jugular.;  Surgeon: Elam Dutch, MD;  Location: [facility];  Service: Vascular;  Laterality: Right;   IR FLUORO GUIDE CV LINE RIGHT  05/04/2019   IR US GUIDE VASC ACCESS RIGHT  05/04/2019   LIGATION OF ARTERIOVENOUS  FISTULA Left 09/26/2018   Procedure: LIGATION OF ARTERIOVENOUS  FISTULA LEFT ARM;  Surgeon: Elam Dutch, MD;  Location: [facility];  Service: Vascular;  Laterality: Left;   PERICARDIAL WINDOW  02/2004   for pericardial effusion   PERIPHERAL VASCULAR INTERVENTION Right 10/26/2019   Procedure: PERIPHERAL VASCULAR INTERVENTION;  Surgeon: Elam Dutch, MD;  Location: [facility];  Service: Cardiovascular;  Laterality: Right;  arm  AV fistula   TUBAL LIGATION  05/1995   UPPER EXTREMITY VENOGRAPHY N/A 06/22/2019   Procedure: UPPER EXTREMITY VENOGRAPHY - Right Central;  Surgeon: Elam Dutch, MD;  Location: [facility];  Service: Cardiovascular;  Laterality: N/A;   HPI:  [name] is a 55 year old woman with CKD on HD who presented with R knee pain  after a fall on 10/14. Developed apnea after pain meds, intubated on 10/14 until 10/18. CXR shows multifocal opacities.  VBG with hypercarbia, compensated (after intubation).  Concern for possible respiratory cause partially contributing.    Assessment / Plan / Recommendation  Clinical Impression  Pt demonstrates signs of acute reversible dyspahgia following 5 day intubation. Pt is dysphonic, failed [place] and coughs after single sips of water and nectar thick liquids. Tolerates puree and solids well. Recommend pt take meds whole with puree, be allowed ice and floor stock snacks but no liquids yet. Expect improvement in the next 24 hours. Will consider FEES if no clinical improvement tomorrow. SLP Visit Diagnosis: Dysphagia,  oropharyngeal phase (R13.12)    Aspiration Risk  Mild aspiration risk    Diet Recommendation Other (Comment);Ice chips PRN after oral care (ice, floor stock solid snacks, pudding puree, meds in puree)   Medication Administration: Whole meds with puree Supervision: Staff to assist with self feeding Compensations: Slow rate;Small sips/bites Postural Changes: Seated upright at 90 degrees    Other  Recommendations      Recommendations for follow up therapy are one component of a multi-disciplinary discharge planning process, led by the attending physician.  Recommendations may be updated based on patient status, additional functional criteria and insurance authorization.  Follow up Recommendations 24 hour supervision/assistance      Frequency and Duration min 2x/week  2 weeks       Prognosis Prognosis for Safe Diet Advancement: Good      Swallow Study   General HPI: [name] is a 55 year old woman with CKD on HD who presented with R knee pain after a fall on 10/14. Developed apnea after pain meds, intubated on 10/14 until 10/18. CXR shows multifocal opacities.  VBG with hypercarbia, compensated (after intubation).  Concern for possible respiratory cause partially contributing. Type of Study: Bedside Swallow Evaluation Previous Swallow Assessment: none Diet Prior to this Study: NPO Temperature Spikes Noted: No Respiratory Status: Nasal cannula History of Recent Intubation: Yes Length of Intubations (days): 5 days Date extubated: 11/18/20 Behavior/Cognition: Alert;Cooperative Oral Cavity Assessment: Within Functional Limits Oral Care Completed by SLP: No Oral Cavity - Dentition: Adequate natural dentition Vision: Functional for self-feeding Self-Feeding Abilities: Needs assist (cant use right hand because of dialysis) Patient Positioning: Partially reclined Baseline Vocal Quality: Hoarse Volitional Cough: Strong    Oral/Motor/Sensory Function Overall Oral Motor/Sensory  Function: Within functional limits   Ice Chips Ice chips: Within functional limits   Thin Liquid Thin Liquid: Impaired Presentation: Cup;Straw Pharyngeal  Phase Impairments: Cough - Immediate    Nectar Thick Nectar Thick Liquid: Impaired Pharyngeal Phase Impairments: Cough - Immediate   Honey Thick Honey Thick Liquid: Not tested   Puree Puree: Within functional limits Presentation: Spoon   Solid     Solid: Within functional limits      Tearsa Kowalewski, Katherene Ponto 11/19/2020,10:57 AM

## 2020-11-20 DIAGNOSIS — G9340 Encephalopathy, unspecified: Secondary | ICD-10-CM | POA: Diagnosis not present

## 2020-11-20 DIAGNOSIS — N186 End stage renal disease: Secondary | ICD-10-CM | POA: Diagnosis not present

## 2020-11-20 DIAGNOSIS — J9622 Acute and chronic respiratory failure with hypercapnia: Secondary | ICD-10-CM

## 2020-11-20 LAB — GLUCOSE, CAPILLARY
Glucose-Capillary: 119 mg/dL — ABNORMAL HIGH (ref 70–99)
Glucose-Capillary: 97 mg/dL (ref 70–99)
Glucose-Capillary: 104 mg/dL — ABNORMAL HIGH (ref 70–99)
Glucose-Capillary: 132 mg/dL — ABNORMAL HIGH (ref 70–99)
Glucose-Capillary: 157 mg/dL — ABNORMAL HIGH (ref 70–99)
Glucose-Capillary: 148 mg/dL — ABNORMAL HIGH (ref 70–99)

## 2020-11-20 LAB — CULTURE, RESPIRATORY W GRAM STAIN: Culture: NORMAL

## 2020-11-20 LAB — HEPATITIS B SURFACE ANTIBODY, QUANTITATIVE: Hep B S AB Quant (Post): 32.3 m[IU]/mL (ref >9.9)

## 2020-11-20 MED ORDER — FERRIC CITRATE 1 GM 210 MG(FE) PO TABS
420.0000 mg | ORAL_TABLET | ORAL | Status: DC | PRN
  Filled 2020-11-20: qty 2

## 2020-11-20 NOTE — Progress Notes (Signed)
Pt refused to wear Bi-pap, pt assessed to have labored breathing maintaining O2 saturations above 92%, still alert & oriented x 4. Pt placed on  4L [place], will continue to monitor.   Elaina Hoops, RN

## 2020-11-20 NOTE — Progress Notes (Signed)
RRT returned to Pt at 2235, Pt still refusing Bi-PAP and is breathing and sleeping with no issues in the sitting position. Vitals are stable and RRT will continue to monitor as needed.

## 2020-11-20 NOTE — Evaluation (Signed)
Physical Therapy Evaluation Patient Details Name: [name] MRN: [id number] DOB: [birth year] Today's Date: 11/20/2020  History of Present Illness  55 yo female presents to [facility] on 10/17 from HD with R knee pain since fall on 10/14. Worsening mental status and apnea in ED possibly due to narcotic overdose narcan administered, ETT 10/17-10/18, requiring bipap at night for retained CO2. CXR shows bilat infiltrates suspect PNA. AVF issues with early termination of HD on 10/19. Fall OOB 10/20.  PMH includes anxiety, RA, CAD, HF, ESRD on HD MWF, HTN, RLS, sleep apnea, thoracic ascending aortic aneurysm, DM II with retinopathy, obesity, tobacco abuse, L thumb amputation.   Clinical Impression  Pt presents with generalized weakness, R knee pain, impaired cognition vs baseline, impaired respiratory status with mobility, and decreased activity tolerance vs baseline. Pt to benefit from acute PT to address deficits. Pt ambulated short distance with RW beside bed, overall requiring min guard to mod assist for bed mobiltiy, transfers, and gait. Anticipate pt will progress well with mobility acutely.  PT to progress mobility as tolerated, and will continue to follow acutely.         Recommendations for follow up therapy are one component of a multi-disciplinary discharge planning process, led by the attending physician.  Recommendations may be updated based on patient status, additional functional criteria and insurance authorization.  Follow Up Recommendations Home health PT;Supervision/Assistance - 24 hour    Equipment Recommendations  None recommended by PT    Recommendations for Other Services       Precautions / Restrictions Precautions Precautions: Fall Restrictions Weight Bearing Restrictions: No      Mobility  Bed Mobility Overal bed mobility: Needs Assistance Bed Mobility: Supine to Sit;Sit to Supine     Supine to sit: Mod assist;HOB elevated Sit to supine: Min assist;HOB  elevated   General bed mobility comments: min-mod assist for trunk and LE management, pt able to boost self up in bed with HOB flat.    Transfers Overall transfer level: Needs assistance Equipment used: Rolling walker (2 wheeled) Transfers: Sit to/from Stand Sit to Stand: Min assist;From elevated surface         General transfer comment: min assist for rise and steady, cues for hand placement when rising.  Ambulation/Gait Ambulation/Gait assistance: Min guard Gait Distance (Feet): 5 Feet Assistive device: Rolling walker (2 wheeled) Gait Pattern/deviations: Step-through pattern;Shuffle;Trunk flexed Gait velocity: decr   General Gait Details: close guard for safety, O2 removed during short distance gait with SpO2 drop to 78% requiring replacement of 4LO2 with recovery to 90s%. Pt does not wear O2 at home.  Stairs            Wheelchair Mobility    Modified Rankin (Stroke Patients Only)       Balance Overall balance assessment: Needs assistance;History of Falls Sitting-balance support: No upper extremity supported Sitting balance-Leahy Scale: Fair     Standing balance support: Bilateral upper extremity supported;During functional activity Standing balance-Leahy Scale: Poor Standing balance comment: reliant on external support                             Pertinent Vitals/Pain Pain Assessment: Faces Faces Pain Scale: Hurts little more Pain Location: R knee Pain Descriptors / Indicators: Sore;Discomfort Pain Intervention(s): Limited activity within patient's tolerance;Monitored during session;Repositioned    Home Living Family/patient expects to be discharged to:: Private residence Living Arrangements: Children;Spouse/significant other Available Help at Discharge: Available 24 hours/day;Family Type of  Home: House Home Access: Stairs to enter   [employer] of Steps: 1 Home Layout: One level Home Equipment: [place] - 4 wheels;Cane -  single point;Shower seat;Hand held shower head;Wheelchair - manual      Prior Function Level of Independence: Independent with assistive device(s)         Comments: pt reports using rollator for to/from dialysis, mobility in home and community. Pt has to use wheelcahir for longer distances     Hand Dominance   Dominant Hand: Right    Extremity/Trunk Assessment   Upper Extremity Assessment Upper Extremity Assessment: Defer to OT evaluation    Lower Extremity Assessment Lower Extremity Assessment: Generalized weakness    Cervical / Trunk Assessment Cervical / Trunk Assessment: Normal  Communication   Communication: No difficulties  Cognition Arousal/Alertness: Awake/alert Behavior During Therapy: WFL for tasks assessed/performed Overall Cognitive Status: Impaired/Different from baseline Area of Impairment: Attention;Memory;Following commands;Safety/judgement;Problem solving                   Current Attention Level: Selective Memory: Decreased short-term memory Following Commands: Follows one step commands with increased time Safety/Judgement: Decreased awareness of deficits;Decreased awareness of safety   Problem Solving: Slow processing;Requires verbal cues;Requires tactile cues General Comments: Pt does not recall events of hospitalization, states she has felt "confused" up until today.      General Comments General comments (skin integrity, edema, etc.): Spo37min 78% on RA, 4LO2 replaced with recovery to 96%. HR WFL    Exercises     Assessment/Plan    PT Assessment Patient needs continued PT services  PT Problem List Decreased strength;Decreased mobility;Decreased safety awareness;Decreased activity tolerance;Decreased balance;Decreased knowledge of use of DME;Pain;Obesity;Decreased cognition;Cardiopulmonary status limiting activity       PT Treatment Interventions DME instruction;Therapeutic activities;Gait training;Therapeutic  exercise;Patient/family education;Balance training;Stair training;Functional mobility training;Neuromuscular re-education    PT Goals (Current goals can be found in the Care Plan section)  Acute Rehab PT Goals Patient Stated Goal: home with family PT Goal Formulation: With patient Time For Goal Achievement: 12/04/20 Potential to Achieve Goals: Good    Frequency Min 3X/week   Barriers to discharge        Co-evaluation               AM-PAC PT "6 Clicks" Mobility  Outcome Measure Help needed turning from your back to your side while in a flat bed without using bedrails?: A Little Help needed moving from lying on your back to sitting on the side of a flat bed without using bedrails?: A Lot Help needed moving to and from a bed to a chair (including a wheelchair)?: A Little Help needed standing up from a chair using your arms (e.g., wheelchair or bedside chair)?: A Little Help needed to walk in hospital room?: A Little Help needed climbing 3-5 steps with a railing? : A Lot 6 Click Score: 16    End of Session Equipment Utilized During Treatment: Oxygen Activity Tolerance: Patient tolerated treatment well Patient left: in bed;with call bell/phone within reach;with bed alarm set;with restraints reapplied (waist restraint) Nurse Communication: Mobility status PT Visit Diagnosis: Other abnormalities of gait and mobility (R26.89);Muscle weakness (generalized) (M62.81)    Time: 3154-0086 PT Time Calculation (min) (ACUTE ONLY): 15 min   Charges:   PT Evaluation $PT Eval Low Complexity: 1 Low          Emeli Goguen S, PT DPT Acute Rehabilitation Services Pager [phone]  Office [phone]   Louis Matte 11/20/2020, 6:28 PM

## 2020-11-20 NOTE — Progress Notes (Signed)
PT refused Bi-PAP till a later time. Pt requested to go on at 2300.

## 2020-11-20 NOTE — Progress Notes (Signed)
SLP Cancellation Note  Patient Details Name: [name] MRN: [id number] DOB: 1965-05-04   Cancelled treatment:        Reason eval/treat not completed: Pt NPO for procedure and scheduled for dialysis in afternoon. SLP will continue to follow for readiness.   Dewitt Rota, SLP-Student    [place] Maryln Eastham 11/20/2020, 12:30 PM

## 2020-11-20 NOTE — Consult Note (Signed)
WOC Nurse Consult Note: Patient receiving care in [facility] [phone] Reason for Consult: BLE wounds Wound type: One small pink wound on the right shin surrounded by pink scar tissue. Appears to be a healing wound.  Left posterior LE wound that measures 1.5 x 1 and is red and moist. Left lateral LE wound that measures 4 x 2 and is yellow, pink with brown yellow drainage.  Anterior side of the left foot wound is 1 x 1 pink Pressure Injury POA: NA Periwound: Skin is dry and flaky Dressing procedure/placement/frequency: Clean both LE with soap and water, rinse and pat dry. Apply small foam dressing to all wounds except the Left lateral LE. Apply a hydrocolloid dressing Kellie Simmering # [phone]) over this wound. This dressing may remain in place up to 3 days unless soiled or starts to peel off then replace. May moisten the legs with [place] Moisturizing lotion (pink top in clean supply) daily.   Monitor the wound area(s) for worsening of condition such as: Signs/symptoms of infection, increase in size, development of or worsening of odor, development of pain, or increased pain at the affected locations.   Notify the medical team if any of these develop.  Thank you for the consult. [place] nurse will not follow at this time.   Please re-consult the [place] team if needed.  Cathlean Marseilles Tamala Julian, MSN, RN, [place], Lysle Pearl, [facility] Wound Treatment Associate Pager [phone]

## 2020-11-20 NOTE — Progress Notes (Signed)
[facility] Progress Note  Subjective: had further AVF issues and had to halt HD yesterday about 40 min early. Pt seen in room today, out of ICU, sitting on side of bed and in good spirits. Nasal O2  Vitals:   11/19/20 2100 11/19/20 2332 11/20/20 0326 11/20/20 0837  BP: (!) 108/57 (!) 118/57 (!) 128/59 126/63  Pulse: 84 87 92 88  Resp: 18 20 14 19   Temp:  98 F (36.7 C) 98.2 F (36.8 C) 98.6 F (37 C)  TempSrc:  Oral Oral Oral  SpO2: 100% 96% 97% 100%  Weight:        Exam:   alert, nad , obese, no distress,  nasal O2  no jvd  Chest cta bilat  Cor reg no RG  Abd soft ntnd no ascites   Ext no LE edema   Alert, NF, ox3    RUA AVF+bruit  Home meds include - humalog 75/25 mix 25 u bid, auryxia 4 ac tid, toprol xl 100, percocet prn, mirapex , nephrovite, humira, robaxin prn, narcan prn, crestor, prns   OP HD: G-O MWF  4h  450/800   111.5kg  2/2 bath  15ga  RUA AVF Hep 10,000  - calc 1.0 ug tiw   - parsabiv 5 mg IV tiw   Assessment/ Plan: Resp failure - CXR large bibasilar infiltrates, suspected PNA. Getting IV doxy +Rocephin. On vent 24 hrs, now on [place] O2 and dc'd from ICU.   Hypotension - resolved ESRD - on HD MWF.  HD tomorrow.  BP/volume - holding home BP meds, BP's soft. At dry wt today. Small UF w/ HD tomorrow.  MBD ckd - resume meds when eating ANemia ckd - not getting esa at OP. Hb down here 8.5 >> 7.0. Transfuse prn. Ordered esa w/ darbe 60 ug q wed, 1st dose given 10/19.  IDDM -  per pmd     Rob Timblin 11/20/2020, 11:23 AM   Recent Labs  Lab 11/18/20 0403 11/18/20 2045 11/19/20 0242  K 4.2 4.6 4.4  BUN 34*  --  44*  CREATININE 6.10*  --  7.69*  CALCIUM 8.2*  --  8.3*  HGB 7.4* 8.2* 7.0*    Inpatient medications:  chlorhexidine  15 mL Mouth Rinse BID   Chlorhexidine Gluconate Cloth  6 each Topical Daily   darbepoetin (ARANESP) injection - DIALYSIS  60 mcg Intravenous Q Wed-HD   ferric citrate  840 mg Oral TID WC   heparin  5,000  Units Subcutaneous Q8H   hydrOXYzine  25 mg Oral Daily   insulin aspart  0-15 Units Subcutaneous Q4H   LORazepam  2 mg Intramuscular Once   metoprolol succinate  100 mg Oral Daily   multivitamin  1 tablet Oral QHS   mupirocin ointment  1 application Topical BID   pramipexole  0.5 mg Oral Daily   rosuvastatin  40 mg Oral Daily    sodium chloride Stopped (11/19/20 1239)   cefTRIAXone (ROCEPHIN)  IV 1 g (11/20/20 0012)   doxycycline (VIBRAMYCIN) IV 100 mg (11/20/20 0204)   acetaminophen, albuterol, docusate sodium, ferric citrate, heparin, influenza vac split quadrivalent PF, LORazepam, magic mouthwash, oxyCODONE-acetaminophen **AND** oxyCODONE, polyethylene glycol

## 2020-11-20 NOTE — Progress Notes (Addendum)
At 8:00 am patient was yelling from room "help". I went to assess her and her nasal canula was taken out by the patient. Her oxygen saturation was in the 70's. So I placed nasal canula back in her nose. She said she couldn't breath and needed to sit up on the side of the bed. She stayed up on the side of the bed with both floor mats down from 800 am until 1230 pm and I asked her to get back in the bed because I was taking a break and needed her to be in the bed. I told charge nurse she has been wanting to get out of the bed and I thought she told me to get her in the bed or get a posey ordered. So that's why I put her in the bed with side rails up and alarm was on. My tech was there and she helped me get her back to bed. But when I left the floor she managed to crawl out of the bed and no one could get to the patient fast enough before she got to the end of the bed with her knees on the floor. She denied any pain or injury.

## 2020-11-20 NOTE — Progress Notes (Signed)
Patient ID: [name], female   DOB: Jun 07, 1965, 55 y.o.   MRN: [id number]  PROGRESS NOTE    MAKANA FEIGEL  KDT:[id number] DOB: 05/06/1965 DOA: 11/17/2020 PCP: Sandi Mariscal, MD   Brief Narrative:  55 year old female with end-stage renal disease on hemodialysis, hypertension, hyperlipidemia, COVID-19 infection, diabetes mellitus type 2, rheumatoid arthritis, anemia of chronic disease, CAD, moderate to severe mitral regurgitation, PAD, OSA, thoracic AAA presented with fall and right knee pain.  She was given IV Dilaudid in the ED for knee pain following which she developed a period of apnea, hypotension and hypoxia.  She did not respond to Narcan.  She was intubated for airway protection and was admitted to ICU.  She had to be started on Levophed drip.  She was extubated on 11/18/2020 and required BiPAP subsequently overnight for CO2 retention.  Nephrology was consulted to continue modalities.  She has been transferred to [facility] service from 11/20/2020 onwards.  Assessment & Plan:   Acute hypoxic and hypercapnic respiratory failure Iatrogenic narcotic overdose Acute toxic encephalopathy Possible aspiration pneumonia -Patient became encephalopathic with apnea, hypotension and hypoxia after IV Dilaudid use in the ED requiring intubation on 11/17/2020, brief Levophed and ICU admission. -Extubated on 11/18/2020 and required BiPAP overnight subsequently for CO2 retention -Transferred to [facility] service from 11/20/2020 onwards -Currently on 4 L oxygen via nasal cannula; wean off as able.  Continue BiPAP at night if patient tolerates -Also on Rocephin and doxycycline for possible pneumonia.  End-stage renal disease on hemodialysis AV fistula malfunction -Nephrology following.  Dialysis as per nephrology schedule.  IR has been consulted for issues with fistula malfunction.  For possible procedure by IR today.  Hypotension History of hypertension -Blood pressure much improved, still intermittently on  the lower side.  Currently on metoprolol  Hyperlipidemia -Continue rosuvastatin  Diabetes mellitus type 2 -Continue CBGs with SSI  Anemia of chronic disease -Hemoglobin 7 on 11/19/2020.  Check hemoglobin in a.m.  Transfuse if hemoglobin is less than 7.  Right knee pain -Secondary fall.  Right knee x-ray showed prepatellar soft tissue swelling, tricompartmental osteoarthritis and a small joint effusion.  Avoid IV opiates  Rheumatoid arthritis -Home Humira and prednisone on hold for now.  Possibly resume on discharge.    DVT prophylaxis: Heparin Code Status: Full Family Communication: None at bedside Disposition Plan: Status is: Inpatient  Remains inpatient appropriate because: Still on IV antibiotics.  Has issues with AV fistula which needs to be fixed by IR   Consultants: PCCM/nephrology/IR  Procedures: Intubation and extubation  Antimicrobials:  Rocephin and doxycycline  Subjective: Patient seen and examined at bedside.  Poor historian.  States that she is hungry.  No overnight fever, vomiting or agitation reported.  Objective: Vitals:   11/19/20 2100 11/19/20 2332 11/20/20 0326 11/20/20 0837  BP: (!) 108/57 (!) 118/57 (!) 128/59 126/63  Pulse: 84 87 92 88  Resp: 18 20 14 19   Temp:  98 F (36.7 C) 98.2 F (36.8 C) 98.6 F (37 C)  TempSrc:  Oral Oral Oral  SpO2: 100% 96% 97% 100%  Weight:        Intake/Output Summary (Last 24 hours) at 11/20/2020 1120 Last data filed at 11/19/2020 1700 Gross per 24 hour  Intake 250.36 ml  Output 349 ml  Net -98.64 ml   Filed Weights   11/17/20 2339 11/18/20 0500 11/19/20 0241  Weight: 113.7 kg 113.7 kg 111.5 kg    Examination:  General exam: Appears calm and comfortable.  Looks chronically ill  and deconditioned.  On 4 L oxygen via nasal cannula Respiratory system: Bilateral decreased breath sounds at bases with scattered crackles Cardiovascular system: S1 & S2 heard, Rate controlled Gastrointestinal system:  Abdomen is morbidly obese, nondistended, soft and nontender. Normal bowel sounds heard. Extremities: No cyanosis, clubbing; trace lower extremity edema present Central nervous system: Awake, slow to respond, poor historian. No focal neurological deficits. Moving extremities Skin: No obvious ecchymosis/rashes Psychiatry: Affect is mostly flat.  Does not participate in conversation much.   Data Reviewed: I have personally reviewed following labs and imaging studies  CBC: Recent Labs  Lab 11/17/20 1700 11/17/20 2005 11/18/20 0402 11/18/20 0403 11/18/20 2045 11/19/20 0242  WBC 8.3  --   --  7.1  --  8.6  NEUTROABS 6.7  --   --   --   --  6.8  HGB 8.4* 8.5* 10.5* 7.4* 8.2* 7.0*  HCT 27.3* 25.0* 31.0* 23.7* 24.0* 22.9*  MCV 101.5*  --   --  98.3  --  100.4*  PLT 264  --   --  226  --  732   Basic Metabolic Panel: Recent Labs  Lab 11/17/20 1700 11/17/20 2005 11/18/20 0402 11/18/20 0403 11/18/20 2045 11/19/20 0242  NA 135 133* 136 136 136 136  K 4.7 4.6 4.3 4.2 4.6 4.4  CL 97*  --   --  94*  --  94*  CO2 28  --   --  28  --  28  GLUCOSE 310*  --   --  224*  --  99  BUN 27*  --   --  34*  --  44*  CREATININE 5.23*  --   --  6.10*  --  7.69*  CALCIUM 7.8*  --   --  8.2*  --  8.3*  MG  --   --   --   --   --  2.0   GFR: Estimated Creatinine Clearance: 10.7 mL/min (A) (by C-G formula based on SCr of 7.69 mg/dL (H)). Liver Function Tests: Recent Labs  Lab 11/17/20 1700  AST 14*  ALT 14  ALKPHOS 144*  BILITOT 0.7  PROT 7.3  ALBUMIN 2.6*   No results for input(s): LIPASE, AMYLASE in the last 168 hours. No results for input(s): AMMONIA in the last 168 hours. Coagulation Profile: No results for input(s): INR, PROTIME in the last 168 hours. Cardiac Enzymes: No results for input(s): CKTOTAL, CKMB, CKMBINDEX, TROPONINI in the last 168 hours. BNP (last 3 results) No results for input(s): PROBNP in the last 8760 hours. HbA1C: No results for input(s): HGBA1C in the last 72  hours. CBG: Recent Labs  Lab 11/19/20 1655 11/19/20 1950 11/19/20 2327 11/20/20 0431 11/20/20 0835  GLUCAP 121* 173* 156* 119* 97   Lipid Profile: No results for input(s): CHOL, HDL, LDLCALC, TRIG, CHOLHDL, LDLDIRECT in the last 72 hours. Thyroid Function Tests: Recent Labs    11/18/20 0402  TSH 1.380   Anemia Panel: No results for input(s): VITAMINB12, FOLATE, FERRITIN, TIBC, IRON, RETICCTPCT in the last 72 hours. Sepsis Labs: Recent Labs  Lab 11/17/20 2224 11/18/20 0035  PROCALCITON 0.67  --   LATICACIDVEN  --  1.1    Recent Results (from the past 240 hour(s))  MRSA Next Gen by PCR, Nasal     Status: None   Collection Time: 11/17/20 10:28 PM   Specimen: Nasal Mucosa; Nasal Swab  Result Value Ref Range Status   MRSA by PCR Next Gen NOT DETECTED NOT DETECTED  Final    Comment: (NOTE) The GeneXpert MRSA Assay (FDA approved for NASAL specimens only), is one component of a comprehensive MRSA colonization surveillance program. It is not intended to diagnose MRSA infection nor to guide or monitor treatment for MRSA infections. Test performance is not FDA approved in patients less than 20 years old. Performed at [facility], [place] [address] [place], [place] [address]   Culture, blood (routine x 2)     Status: None (Preliminary result)   Collection Time: 11/18/20 12:09 AM   Specimen: BLOOD RIGHT HAND  Result Value Ref Range Status   Specimen Description BLOOD RIGHT HAND  Final   Special Requests   Final    BOTTLES DRAWN AEROBIC ONLY Blood Culture adequate volume   Culture   Final    NO GROWTH 1 DAY Performed at [facility], [place] [address], [place], [place] [address]    Report Status PENDING  Incomplete  Culture, blood (routine x 2)     Status: None (Preliminary result)   Collection Time: 11/18/20 12:19 AM   Specimen: BLOOD  Result Value Ref Range Status   Specimen Description BLOOD LEFT ANTECUBITAL  Final   Special Requests   Final    BOTTLES DRAWN  AEROBIC AND ANAEROBIC Blood Culture adequate volume   Culture   Final    NO GROWTH 1 DAY Performed at [facility], [place] [address], [place],  [address]    Report Status PENDING  Incomplete  Resp Panel by RT-PCR (Flu A&B, Covid) Nasopharyngeal Swab     Status: None   Collection Time: 11/18/20  4:08 AM   Specimen: Nasopharyngeal Swab; Nasopharyngeal(NP) swabs in vial transport medium  Result Value Ref Range Status   SARS Coronavirus 2 by RT PCR NEGATIVE NEGATIVE Final    Comment: (NOTE) SARS-CoV-2 target nucleic acids are NOT DETECTED.  The SARS-CoV-2 RNA is generally detectable in upper respiratory specimens during the acute phase of infection. The lowest concentration of SARS-CoV-2 viral copies this assay can detect is 138 copies/mL. A negative result does not preclude SARS-Cov-2 infection and should not be used as the sole basis for treatment or other patient management decisions. A negative result may occur with  improper specimen collection/handling, submission of specimen other than nasopharyngeal swab, presence of viral mutation(s) within the areas targeted by this assay, and inadequate number of viral copies(<138 copies/mL). A negative result must be combined with clinical observations, patient history, and epidemiological information. The expected result is Negative.  Fact Sheet for Patients:  EntrepreneurPulse.com.au  Fact Sheet for Healthcare Providers:  IncredibleEmployment.be  This test is no t yet approved or cleared by the [place] FDA and  has been authorized for detection and/or diagnosis of SARS-CoV-2 by FDA under an Emergency Use Authorization (EUA). This EUA will remain  in effect (meaning this test can be used) for the duration of the COVID-19 declaration under Section 564(b)(1) of the Act, 21 U.S.C.section 360bbb-3(b)(1), unless the authorization is terminated  or revoked sooner.       Influenza A by  PCR NEGATIVE NEGATIVE Final   Influenza B by PCR NEGATIVE NEGATIVE Final    Comment: (NOTE) The Xpert Xpress SARS-CoV-2/FLU/RSV plus assay is intended as an aid in the diagnosis of influenza from Nasopharyngeal swab specimens and should not be used as a sole basis for treatment. Nasal washings and aspirates are unacceptable for Xpert Xpress SARS-CoV-2/FLU/RSV testing.  Fact Sheet for Patients: EntrepreneurPulse.com.au  Fact Sheet for Healthcare Providers:  IncredibleEmployment.be  This test is not yet approved or cleared by the [place] and has been authorized for detection and/or diagnosis of SARS-CoV-2 by FDA under an Emergency Use Authorization (EUA). This EUA will remain in effect (meaning this test can be used) for the duration of the COVID-19 declaration under Section 564(b)(1) of the Act, 21 U.S.C. section 360bbb-3(b)(1), unless the authorization is terminated or revoked.  Performed at [facility], [place] [address], [place], [place] [address]   Culture, Respiratory w Gram Stain     Status: None   Collection Time: 11/18/20 10:14 AM   Specimen: Tracheal Aspirate; Respiratory  Result Value Ref Range Status   Specimen Description TRACHEAL ASPIRATE  Final   Special Requests NONE  Final   Gram Stain   Final    ABUNDANT WBC PRESENT,BOTH PMN AND MONONUCLEAR RARE GRAM POSITIVE COCCI RARE GRAM VARIABLE ROD    Culture   Final    RARE Normal respiratory flora-no Staph aureus or Pseudomonas seen Performed at [facility], [address], [place], [place] [address]    Report Status 11/20/2020 FINAL  Final         Radiology Studies: No results found.      Scheduled Meds:  chlorhexidine  15 mL Mouth Rinse BID   Chlorhexidine Gluconate Cloth  6 each Topical Daily   darbepoetin (ARANESP) injection - DIALYSIS  60 mcg Intravenous Q Wed-HD   ferric citrate  840 mg Oral TID WC   heparin  5,000 Units Subcutaneous Q8H    hydrOXYzine  25 mg Oral Daily   insulin aspart  0-15 Units Subcutaneous Q4H   LORazepam  2 mg Intramuscular Once   metoprolol succinate  100 mg Oral Daily   multivitamin  1 tablet Oral QHS   mupirocin ointment  1 application Topical BID   pramipexole  0.5 mg Oral Daily   rosuvastatin  40 mg Oral Daily   Continuous Infusions:  sodium chloride Stopped (11/19/20 1239)   cefTRIAXone (ROCEPHIN)  IV 1 g (11/20/20 0012)   doxycycline (VIBRAMYCIN) IV 100 mg (11/20/20 7741)          Aline August, MD [facility] 11/20/2020, 11:20 AM

## 2020-11-20 NOTE — Progress Notes (Signed)
    Secondary to emergent cases in IR today This pts IR procedure has been rescheduled to 10/21 am  See new orders placed

## 2020-11-20 NOTE — Care Management Important Message (Signed)
Important Message  Patient Details  Name: [name] MRN: [id number] Date of Birth: 26-May-1965   Medicare Important Message Given:  Yes     Vonnie Spagnolo 11/20/2020, 2:22 PM

## 2020-11-21 ENCOUNTER — Inpatient Hospital Stay (HOSPITAL_COMMUNITY): Payer: Medicare Other

## 2020-11-21 DIAGNOSIS — N186 End stage renal disease: Secondary | ICD-10-CM | POA: Diagnosis not present

## 2020-11-21 DIAGNOSIS — Z992 Dependence on renal dialysis: Secondary | ICD-10-CM | POA: Diagnosis not present

## 2020-11-21 DIAGNOSIS — G9340 Encephalopathy, unspecified: Secondary | ICD-10-CM | POA: Diagnosis not present

## 2020-11-21 HISTORY — PX: IR DIALY SHUNT INTRO NEEDLE/INTRACATH INITIAL W/IMG RIGHT: IMG6115

## 2020-11-21 HISTORY — PX: IR US GUIDE VASC ACCESS RIGHT: IMG2390

## 2020-11-21 LAB — CBC WITH DIFFERENTIAL/PLATELET
Abs Immature Granulocytes: 0.08 10*3/uL — ABNORMAL HIGH (ref 0.00–0.07)
Basophils Absolute: 0 10*3/uL (ref 0.0–0.1)
Basophils Relative: 0 %
Eosinophils Absolute: 0.2 10*3/uL (ref 0.0–0.5)
Eosinophils Relative: 4 %
HCT: 21.5 % — ABNORMAL LOW (ref 36.0–46.0)
Hemoglobin: 6.8 g/dL — CL (ref 12.0–15.0)
Immature Granulocytes: 1 %
Lymphocytes Relative: 14 %
Lymphs Abs: 0.8 10*3/uL (ref 0.7–4.0)
MCH: 31.1 pg (ref 26.0–34.0)
MCHC: 31.6 g/dL (ref 30.0–36.0)
MCV: 98.2 fL (ref 80.0–100.0)
Monocytes Absolute: 0.4 10*3/uL (ref 0.1–1.0)
Monocytes Relative: 7 %
Neutro Abs: 4.5 10*3/uL (ref 1.7–7.7)
Neutrophils Relative %: 74 %
Platelets: 207 10*3/uL (ref 150–400)
RBC: 2.19 MIL/uL — ABNORMAL LOW (ref 3.87–5.11)
RDW: 14.3 % (ref 11.5–15.5)
WBC: 6 10*3/uL (ref 4.0–10.5)
nRBC: 0 % (ref 0.0–0.2)

## 2020-11-21 LAB — BASIC METABOLIC PANEL
Anion gap: 12 (ref 5–15)
BUN: 39 mg/dL — ABNORMAL HIGH (ref 6–20)
CO2: 26 mmol/L (ref 22–32)
Calcium: 8.4 mg/dL — ABNORMAL LOW (ref 8.9–10.3)
Chloride: 98 mmol/L (ref 98–111)
Creatinine, Ser: 8.05 mg/dL — ABNORMAL HIGH (ref 0.44–1.00)
GFR, Estimated: 5 mL/min — ABNORMAL LOW (ref >60)
Glucose, Bld: 121 mg/dL — ABNORMAL HIGH (ref 70–99)
Potassium: 4.4 mmol/L (ref 3.5–5.1)
Sodium: 136 mmol/L (ref 135–145)

## 2020-11-21 LAB — GLUCOSE, CAPILLARY
Glucose-Capillary: 120 mg/dL — ABNORMAL HIGH (ref 70–99)
Glucose-Capillary: 105 mg/dL — ABNORMAL HIGH (ref 70–99)
Glucose-Capillary: 123 mg/dL — ABNORMAL HIGH (ref 70–99)
Glucose-Capillary: 95 mg/dL (ref 70–99)
Glucose-Capillary: 82 mg/dL (ref 70–99)

## 2020-11-21 LAB — HEMOGLOBIN AND HEMATOCRIT, BLOOD
HCT: 23.1 % — ABNORMAL LOW (ref 36.0–46.0)
Hemoglobin: 7.4 g/dL — ABNORMAL LOW (ref 12.0–15.0)

## 2020-11-21 LAB — PREPARE RBC (CROSSMATCH)

## 2020-11-21 MED ORDER — FENTANYL CITRATE (PF) 100 MCG/2ML IJ SOLN
INTRAMUSCULAR | Status: AC
  Filled 2020-11-21: qty 2

## 2020-11-21 MED ORDER — MIDAZOLAM HCL 2 MG/2ML IJ SOLN
INTRAMUSCULAR | Status: AC
  Filled 2020-11-21: qty 2

## 2020-11-21 MED ORDER — HEPARIN SODIUM (PORCINE) 1000 UNIT/ML DIALYSIS: 4000.0000 [IU] | INTRAMUSCULAR | Status: DC | PRN

## 2020-11-21 MED ORDER — LIDOCAINE HCL 1 % IJ SOLN
INTRAMUSCULAR | Status: AC
  Filled 2020-11-21: qty 20

## 2020-11-21 MED ORDER — IOHEXOL 300 MG/ML  SOLN
100.0000 mL | Freq: Once | INTRAMUSCULAR | Status: AC | PRN
  Administered 2020-11-21: 20 mL via INTRA_ARTERIAL

## 2020-11-21 MED ORDER — SODIUM CHLORIDE 0.9% IV SOLUTION: Freq: Once | INTRAVENOUS | Status: DC

## 2020-11-21 NOTE — Progress Notes (Addendum)
[facility] Progress Note  Subjective: seen in room, no c/o's. On schedule for fistulogram today.   Vitals:   11/21/20 0345 11/21/20 0719 11/21/20 0800 11/21/20 0817  BP:  126/70 125/61 (!) 143/65  Pulse:  87    Resp:  20 15 (!) 22  Temp:  98.5 F (36.9 C) 98.1 F (36.7 C) 98.2 F (36.8 C)  TempSrc:  Oral Oral Oral  SpO2:  96% 99% 95%  Weight: 114.5 kg       Exam:   alert, nad , obese, no distress,  nasal O2  no jvd  Chest cta bilat  Cor reg no RG  Abd soft ntnd no ascites   Ext no LE edema   Alert, NF, ox3    RUA AVF+bruit  Home meds include - humalog 75/25 mix 25 u bid, auryxia 4 ac tid, toprol xl 100, percocet prn, mirapex , nephrovite, humira, robaxin prn, narcan prn, crestor, prns   OP HD: [facility] MWF  4h  450/800   111.5kg  2/2 bath  15ga  RUA AVF Hep 10,000  - calc 1.0 ug tiw   - parsabiv 5 mg IV tiw   Assessment/ Plan: Resp failure - CXR large bibasilar infiltrates, suspected PNA. Getting IV doxy +Rocephin. Spent <24 hrs on the ventilator. Improving, on nasal O2.   Shock/ hypotension - resolved ESRD - on HD MWF.  On schedule w/ IR today for malfunctioning AVF. Plan HD later today/ tonight or possibly tomorrow.  BP/volume - BP's better, wts up 3kg+. UF 2 L w/ HD as tol MBD ckd - cont binders ANemia ckd - was not getting esa as OP. Hb low today, getting prbc's. Ordered esa w/ darbe 60 ug q wed, 1st dose given 10/19.  IDDM -  per pmd     Rob [place] 11/21/2020, 9:21 AM   Recent Labs  Lab 11/19/20 0242 11/21/20 0334  K 4.4 4.4  BUN 44* 39*  CREATININE 7.69* 8.05*  CALCIUM 8.3* 8.4*  HGB 7.0* 6.8*    Inpatient medications:  sodium chloride   Intravenous Once   chlorhexidine  15 mL Mouth Rinse BID   Chlorhexidine Gluconate Cloth  6 each Topical Daily   darbepoetin (ARANESP) injection - DIALYSIS  60 mcg Intravenous Q Wed-HD   ferric citrate  840 mg Oral TID WC   heparin  5,000 Units Subcutaneous Q8H   hydrOXYzine  25 mg Oral Daily    insulin aspart  0-15 Units Subcutaneous Q4H   metoprolol succinate  100 mg Oral Daily   multivitamin  1 tablet Oral QHS   mupirocin ointment  1 application Topical BID   pramipexole  0.5 mg Oral Daily   rosuvastatin  40 mg Oral Daily    sodium chloride Stopped (11/19/20 1239)   cefTRIAXone (ROCEPHIN)  IV 1 g (11/20/20 2320)   doxycycline (VIBRAMYCIN) IV 100 mg (11/21/20 0029)   acetaminophen, albuterol, docusate sodium, ferric citrate, heparin, influenza vac split quadrivalent PF, magic mouthwash, oxyCODONE-acetaminophen **AND** oxyCODONE, polyethylene glycol

## 2020-11-21 NOTE — Progress Notes (Signed)
On call, [place], notified of critical hemoglobin 6.8

## 2020-11-21 NOTE — Progress Notes (Signed)
Patient breathing labored and noted patient more drowsy after return from IR. Patient agreed to wear bipap for nap; requested respiratory to place. Patient now sleeping with bipap on.

## 2020-11-21 NOTE — Procedures (Signed)
Interventional Radiology Procedure Note  Procedure: Fistulagram  Findings: Please refer to procedural dictation for full description.  RUE brachiobasilic fistula widely patent.  Mild stenosis at cephalic arch confluence.  Indwelling stents patent.  Central veins patent.  Ultrasound evaluation demonstrates hematomas surrounding puncture zone.    Complications: None immediate  Estimated Blood Loss: < 5 mL  Recommendations: Fistula patent and OK to use.  Prior malfunction likely secondary to imprecise needle access given presence of hematoma.   Ruthann Cancer, MD Pager: [phone]

## 2020-11-21 NOTE — Sedation Documentation (Addendum)
Procedure done, no intervention done. No sedation given. Patient groggy but wakes on own. Knows her name and where she is but is forgetful.

## 2020-11-21 NOTE — Sedation Documentation (Addendum)
Patient is groggy with SpO2 90-93 on 5L [place]. Has wedge under chest head to help prop her up to facilitate respirations. Dr Serafina Royals aware and will hold off of sedation for now

## 2020-11-21 NOTE — Progress Notes (Signed)
SLP Cancellation Note  Patient Details Name: [name] MRN: [id number] DOB: [birth year]   Cancelled treatment:        Pt NPO this am for procedure, check chart after procedure and spoke with RN who states currently having labored breathing. Will plan to see tomorrow.    Houston Siren 11/21/2020, 3:49 PM   Orbie Pyo Colvin Caroli.Ed Risk analyst [phone] Office [phone]

## 2020-11-21 NOTE — Progress Notes (Signed)
Patient ID: [name], female   DOB: 10/02/1965, 55 y.o.   MRN: [id number]  PROGRESS NOTE    TOBI GROESBECK  YSA:[id number] DOB: 1965-11-22 DOA: 11/17/2020 PCP: Sandi Mariscal, MD   Brief Narrative:  55 year old female with end-stage renal disease on hemodialysis, hypertension, hyperlipidemia, COVID-19 infection, diabetes mellitus type 2, rheumatoid arthritis, anemia of chronic disease, CAD, moderate to severe mitral regurgitation, PAD, OSA, thoracic AAA presented with fall and right knee pain.  She was given IV Dilaudid in the ED for knee pain following which she developed a period of apnea, hypotension and hypoxia.  She did not respond to Narcan.  She was intubated for airway protection and was admitted to ICU.  She had to be started on Levophed drip.  She was extubated on 11/18/2020 and required BiPAP subsequently overnight for CO2 retention.  Nephrology was consulted to continue modalities.  She has been transferred to [facility] service from 11/20/2020 onwards.  Assessment & Plan:   Acute hypoxic and hypercapnic respiratory failure Iatrogenic narcotic overdose Acute toxic encephalopathy Possible aspiration pneumonia -Patient became encephalopathic with apnea, hypotension and hypoxia after IV Dilaudid use in the ED requiring intubation on 11/17/2020, brief Levophed and ICU admission. -Extubated on 11/18/2020 and required BiPAP overnight subsequently for CO2 retention -Transferred to [facility] service from 11/20/2020 onwards -Currently still on 4 L oxygen via nasal cannula; wean off as able.  Patient refused BiPAP last night. -Also on Rocephin and doxycycline for possible pneumonia: Finish 5-day course. -Mental status improving: Briefly required restraints on 11/20/2020.  End-stage renal disease on hemodialysis AV fistula malfunction -Nephrology following.  Dialysis as per nephrology schedule.  IR has been consulted for issues with fistula malfunction.  IR procedure has been postponed for  today.  Hypotension History of hypertension -Blood still intermittently on the lower side.  Currently on metoprolol with holding parameters  Hyperlipidemia -Continue rosuvastatin  Diabetes mellitus type 2 -Continue CBGs with SSI  Anemia of chronic disease -Hemoglobin 6.8 today.  Transfuse 1 unit packed red cells.  Monitor H&H.  Right knee pain -Secondary to fall.  Right knee x-ray showed prepatellar soft tissue swelling, tricompartmental osteoarthritis and a small joint effusion.  Avoid IV opiates  Rheumatoid arthritis -Home Humira and prednisone on hold for now.  Possibly resume on discharge.    DVT prophylaxis: Heparin Code Status: Full Family Communication: Sister on phone on 11/21/2020 Disposition Plan: Status is: Inpatient  Remains inpatient appropriate because: Still on IV antibiotics.  Has issues with AV fistula which needs to be fixed by IR   Consultants: PCCM/nephrology/IR  Procedures: Intubation and extubation  Antimicrobials:  Rocephin and doxycycline from 11/07/2020 onwards  Subjective: Patient seen and examined at bedside.  Poor historian.  Patient was agitated yesterday afternoon as per nursing staff requiring restraints.  No overnight fever, vomiting or chest pain reported.  Objective: Vitals:   11/20/20 1932 11/20/20 2315 11/21/20 0341 11/21/20 0345  BP: (!) 116/55 (!) 141/79 (!) 84/53   Pulse: 85 90    Resp: (!) 24 20    Temp: 98.5 F (36.9 C) 98.1 F (36.7 C) 98.3 F (36.8 C)   TempSrc: Oral Oral Oral   SpO2: 97% 97% 96%   Weight:    114.5 kg   No intake or output data in the 24 hours ending 11/21/20 0713  Filed Weights   11/18/20 0500 11/19/20 0241 11/21/20 0345  Weight: 113.7 kg 111.5 kg 114.5 kg    Examination:  General exam: Still on 4 L oxygen by  nasal cannula.  No distress.  Looks chronically ill and deconditioned.   Respiratory system: Clear breath sounds at bases bilaterally with some crackles  cardiovascular system: Rate  controlled, S1-S2 heard Gastrointestinal system: Abdomen is morbidly obese, distended slightly, soft and nontender.  Bowel sounds are heard  extremities: Mild lower extremity edema present; no clubbing  Central nervous system: More awake this morning, still slow to respond very poor historian no focal neurological deficits.  Moves extremities  skin: No obvious petechiae/lesions  psychiatry: Flat affect.   Data Reviewed: I have personally reviewed following labs and imaging studies  CBC: Recent Labs  Lab 11/17/20 1700 11/17/20 2005 11/18/20 0402 11/18/20 0403 11/18/20 2045 11/19/20 0242 11/21/20 0334  WBC 8.3  --   --  7.1  --  8.6 6.0  NEUTROABS 6.7  --   --   --   --  6.8 4.5  HGB 8.4*   < > 10.5* 7.4* 8.2* 7.0* 6.8*  HCT 27.3*   < > 31.0* 23.7* 24.0* 22.9* 21.5*  MCV 101.5*  --   --  98.3  --  100.4* 98.2  PLT 264  --   --  226  --  199 207   < > = values in this interval not displayed.    Basic Metabolic Panel: Recent Labs  Lab 11/17/20 1700 11/17/20 2005 11/18/20 0402 11/18/20 0403 11/18/20 2045 11/19/20 0242 11/21/20 0334  NA 135   < > 136 136 136 136 136  K 4.7   < > 4.3 4.2 4.6 4.4 4.4  CL 97*  --   --  94*  --  94* 98  CO2 28  --   --  28  --  28 26  GLUCOSE 310*  --   --  224*  --  99 121*  BUN 27*  --   --  34*  --  44* 39*  CREATININE 5.23*  --   --  6.10*  --  7.69* 8.05*  CALCIUM 7.8*  --   --  8.2*  --  8.3* 8.4*  MG  --   --   --   --   --  2.0  --    < > = values in this interval not displayed.    GFR: Estimated Creatinine Clearance: 10.4 mL/min (A) (by C-G formula based on SCr of 8.05 mg/dL (H)). Liver Function Tests: Recent Labs  Lab 11/17/20 1700  AST 14*  ALT 14  ALKPHOS 144*  BILITOT 0.7  PROT 7.3  ALBUMIN 2.6*    No results for input(s): LIPASE, AMYLASE in the last 168 hours. No results for input(s): AMMONIA in the last 168 hours. Coagulation Profile: No results for input(s): INR, PROTIME in the last 168 hours. Cardiac  Enzymes: No results for input(s): CKTOTAL, CKMB, CKMBINDEX, TROPONINI in the last 168 hours. BNP (last 3 results) No results for input(s): PROBNP in the last 8760 hours. HbA1C: No results for input(s): HGBA1C in the last 72 hours. CBG: Recent Labs  Lab 11/20/20 1133 11/20/20 1503 11/20/20 1940 11/20/20 2314 11/21/20 0344  GLUCAP 104* 132* 157* 148* 120*    Lipid Profile: No results for input(s): CHOL, HDL, LDLCALC, TRIG, CHOLHDL, LDLDIRECT in the last 72 hours. Thyroid Function Tests: No results for input(s): TSH, T4TOTAL, FREET4, T3FREE, THYROIDAB in the last 72 hours.  Anemia Panel: No results for input(s): VITAMINB12, FOLATE, FERRITIN, TIBC, IRON, RETICCTPCT in the last 72 hours. Sepsis Labs: Recent Labs  Lab 11/17/20 2224 11/18/20 0035  PROCALCITON 0.67  --   LATICACIDVEN  --  1.1     Recent Results (from the past 240 hour(s))  MRSA Next Gen by PCR, Nasal     Status: None   Collection Time: 11/17/20 10:28 PM   Specimen: Nasal Mucosa; Nasal Swab  Result Value Ref Range Status   MRSA by PCR Next Gen NOT DETECTED NOT DETECTED Final    Comment: (NOTE) The GeneXpert MRSA Assay (FDA approved for NASAL specimens only), is one component of a comprehensive MRSA colonization surveillance program. It is not intended to diagnose MRSA infection nor to guide or monitor treatment for MRSA infections. Test performance is not FDA approved in patients less than 60 years old. Performed at [facility], [place] [address] [place], [place] [address]   Culture, blood (routine x 2)     Status: None (Preliminary result)   Collection Time: 11/18/20 12:09 AM   Specimen: BLOOD RIGHT HAND  Result Value Ref Range Status   Specimen Description BLOOD RIGHT HAND  Final   Special Requests   Final    BOTTLES DRAWN AEROBIC ONLY Blood Culture adequate volume   Culture   Final    NO GROWTH 2 DAYS Performed at [facility], [place] [address], [place], [place] [address]    Report  Status PENDING  Incomplete  Culture, blood (routine x 2)     Status: None (Preliminary result)   Collection Time: 11/18/20 12:19 AM   Specimen: BLOOD  Result Value Ref Range Status   Specimen Description BLOOD LEFT ANTECUBITAL  Final   Special Requests   Final    BOTTLES DRAWN AEROBIC AND ANAEROBIC Blood Culture adequate volume   Culture   Final    NO GROWTH 2 DAYS Performed at [facility], [place] [address], [place], [place] [address]    Report Status PENDING  Incomplete  Resp Panel by RT-PCR (Flu A&B, Covid) Nasopharyngeal Swab     Status: None   Collection Time: 11/18/20  4:08 AM   Specimen: Nasopharyngeal Swab; Nasopharyngeal(NP) swabs in vial transport medium  Result Value Ref Range Status   SARS Coronavirus 2 by RT PCR NEGATIVE NEGATIVE Final    Comment: (NOTE) SARS-CoV-2 target nucleic acids are NOT DETECTED.  The SARS-CoV-2 RNA is generally detectable in upper respiratory specimens during the acute phase of infection. The lowest concentration of SARS-CoV-2 viral copies this assay can detect is 138 copies/mL. A negative result does not preclude SARS-Cov-2 infection and should not be used as the sole basis for treatment or other patient management decisions. A negative result may occur with  improper specimen collection/handling, submission of specimen other than nasopharyngeal swab, presence of viral mutation(s) within the areas targeted by this assay, and inadequate number of viral copies(<138 copies/mL). A negative result must be combined with clinical observations, patient history, and epidemiological information. The expected result is Negative.  Fact Sheet for Patients:  EntrepreneurPulse.com.au  Fact Sheet for Healthcare Providers:  IncredibleEmployment.be  This test is no t yet approved or cleared by the [place] FDA and  has been authorized for detection and/or diagnosis of SARS-CoV-2 by FDA under an Emergency Use  Authorization (EUA). This EUA will remain  in effect (meaning this test can be used) for the duration of the COVID-19 declaration under Section 564(b)(1) of the Act, 21 U.S.C.section 360bbb-3(b)(1), unless the authorization is terminated  or revoked sooner.       Influenza A by PCR NEGATIVE NEGATIVE Final  Influenza B by PCR NEGATIVE NEGATIVE Final    Comment: (NOTE) The Xpert Xpress SARS-CoV-2/FLU/RSV plus assay is intended as an aid in the diagnosis of influenza from Nasopharyngeal swab specimens and should not be used as a sole basis for treatment. Nasal washings and aspirates are unacceptable for Xpert Xpress SARS-CoV-2/FLU/RSV testing.  Fact Sheet for Patients: EntrepreneurPulse.com.au  Fact Sheet for Healthcare Providers: IncredibleEmployment.be  This test is not yet approved or cleared by the [place] FDA and has been authorized for detection and/or diagnosis of SARS-CoV-2 by FDA under an Emergency Use Authorization (EUA). This EUA will remain in effect (meaning this test can be used) for the duration of the COVID-19 declaration under Section 564(b)(1) of the Act, 21 U.S.C. section 360bbb-3(b)(1), unless the authorization is terminated or revoked.  Performed at [facility], [place] [address] [place], [place] [address]   Culture, Respiratory w Gram Stain     Status: None   Collection Time: 11/18/20 10:14 AM   Specimen: Tracheal Aspirate; Respiratory  Result Value Ref Range Status   Specimen Description TRACHEAL ASPIRATE  Final   Special Requests NONE  Final   Gram Stain   Final    ABUNDANT WBC PRESENT,BOTH PMN AND MONONUCLEAR RARE GRAM POSITIVE COCCI RARE GRAM VARIABLE ROD    Culture   Final    RARE Normal respiratory flora-no Staph aureus or Pseudomonas seen Performed at [facility], [address] [place], [place] [address]    Report Status 11/20/2020 FINAL  Final          Radiology Studies: No  results found.      Scheduled Meds:  sodium chloride   Intravenous Once   chlorhexidine  15 mL Mouth Rinse BID   Chlorhexidine Gluconate Cloth  6 each Topical Daily   darbepoetin (ARANESP) injection - DIALYSIS  60 mcg Intravenous Q Wed-HD   ferric citrate  840 mg Oral TID WC   heparin  5,000 Units Subcutaneous Q8H   hydrOXYzine  25 mg Oral Daily   insulin aspart  0-15 Units Subcutaneous Q4H   metoprolol succinate  100 mg Oral Daily   multivitamin  1 tablet Oral QHS   mupirocin ointment  1 application Topical BID   pramipexole  0.5 mg Oral Daily   rosuvastatin  40 mg Oral Daily   Continuous Infusions:  sodium chloride Stopped (11/19/20 1239)   cefTRIAXone (ROCEPHIN)  IV 1 g (11/20/20 2320)   doxycycline (VIBRAMYCIN) IV 100 mg (11/21/20 0029)          Aline August, MD [facility] 11/21/2020, 7:13 AM

## 2020-11-22 ENCOUNTER — Inpatient Hospital Stay (HOSPITAL_COMMUNITY): Payer: Medicare Other

## 2020-11-22 DIAGNOSIS — I953 Hypotension of hemodialysis: Secondary | ICD-10-CM

## 2020-11-22 DIAGNOSIS — D631 Anemia in chronic kidney disease: Secondary | ICD-10-CM

## 2020-11-22 DIAGNOSIS — E782 Mixed hyperlipidemia: Secondary | ICD-10-CM | POA: Diagnosis not present

## 2020-11-22 DIAGNOSIS — N186 End stage renal disease: Secondary | ICD-10-CM | POA: Diagnosis not present

## 2020-11-22 DIAGNOSIS — G9340 Encephalopathy, unspecified: Secondary | ICD-10-CM | POA: Diagnosis not present

## 2020-11-22 LAB — GLUCOSE, CAPILLARY
Glucose-Capillary: 104 mg/dL — ABNORMAL HIGH (ref 70–99)
Glucose-Capillary: 104 mg/dL — ABNORMAL HIGH (ref 70–99)
Glucose-Capillary: 140 mg/dL — ABNORMAL HIGH (ref 70–99)
Glucose-Capillary: 154 mg/dL — ABNORMAL HIGH (ref 70–99)
Glucose-Capillary: 158 mg/dL — ABNORMAL HIGH (ref 70–99)
Glucose-Capillary: 171 mg/dL — ABNORMAL HIGH (ref 70–99)

## 2020-11-22 LAB — CBC WITH DIFFERENTIAL/PLATELET
Abs Immature Granulocytes: 0.06 10*3/uL (ref 0.00–0.07)
Basophils Absolute: 0 10*3/uL (ref 0.0–0.1)
Basophils Relative: 0 %
Eosinophils Absolute: 0.2 10*3/uL (ref 0.0–0.5)
Eosinophils Relative: 4 %
HCT: 25.5 % — ABNORMAL LOW (ref 36.0–46.0)
Hemoglobin: 7.9 g/dL — ABNORMAL LOW (ref 12.0–15.0)
Immature Granulocytes: 1 %
Lymphocytes Relative: 12 %
Lymphs Abs: 0.7 10*3/uL (ref 0.7–4.0)
MCH: 30.4 pg (ref 26.0–34.0)
MCHC: 31 g/dL (ref 30.0–36.0)
MCV: 98.1 fL (ref 80.0–100.0)
Monocytes Absolute: 0.4 10*3/uL (ref 0.1–1.0)
Monocytes Relative: 7 %
Neutro Abs: 4.1 10*3/uL (ref 1.7–7.7)
Neutrophils Relative %: 76 %
Platelets: 203 10*3/uL (ref 150–400)
RBC: 2.6 MIL/uL — ABNORMAL LOW (ref 3.87–5.11)
RDW: 14.7 % (ref 11.5–15.5)
WBC: 5.5 10*3/uL (ref 4.0–10.5)
nRBC: 0.4 % — ABNORMAL HIGH (ref 0.0–0.2)

## 2020-11-22 LAB — BASIC METABOLIC PANEL
Anion gap: 12 (ref 5–15)
BUN: 18 mg/dL (ref 6–20)
CO2: 27 mmol/L (ref 22–32)
Calcium: 8.9 mg/dL (ref 8.9–10.3)
Chloride: 98 mmol/L (ref 98–111)
Creatinine, Ser: 5.09 mg/dL — ABNORMAL HIGH (ref 0.44–1.00)
GFR, Estimated: 9 mL/min — ABNORMAL LOW (ref >60)
Glucose, Bld: 112 mg/dL — ABNORMAL HIGH (ref 70–99)
Potassium: 3.9 mmol/L (ref 3.5–5.1)
Sodium: 137 mmol/L (ref 135–145)

## 2020-11-22 LAB — TYPE AND SCREEN
ABO/RH(D): A POS
Antibody Screen: NEGATIVE
Unit division: 0

## 2020-11-22 LAB — BPAM RBC
Blood Product Expiration Date: 202210232359
ISSUE DATE / TIME: 202210210742
Unit Type and Rh: 600

## 2020-11-22 NOTE — Progress Notes (Signed)
[facility] Progress Note  Subjective: seen in room, c/o new dyspnea when eating. Had fistulogram yesterday.   Vitals:   11/22/20 0738 11/22/20 0749 11/22/20 1204 11/22/20 1544  BP: (!) 163/145 (!) 163/145 (!) 141/69 (!) 144/67  Pulse: 88 82 82 78  Resp: (!) 24 16 19 20   Temp:  98.8 F (37.1 C) 98.3 F (36.8 C) 98.2 F (36.8 C)  TempSrc:  Oral Oral Oral  SpO2: (!) 83% 95% 100% 100%  Weight:        Exam:   alert, nad , obese, no distress,  nasal O2  no jvd  Chest cta bilat  Cor reg no RG  Abd soft ntnd no ascites   Ext no LE edema   Alert, NF, ox3    RUA AVF+bruit  Home meds include - humalog 75/25 mix 25 u bid, auryxia 4 ac tid, toprol xl 100, percocet prn, mirapex , nephrovite, humira, robaxin prn, narcan prn, crestor, prns   OP HD: G-O MWF  4h  450/800   111.5kg  2/2 bath  15ga  RUA AVF Hep 10,000  - calc 1.0 ug tiw   - parsabiv 5 mg IV tiw   Assessment/ Plan: Resp failure - CXR large bibasilar infiltrates, suspected PNA. Getting IV doxy +Rocephin. Spent <24 hrs on the ventilator. Improving, on nasal O2.   Shock/ hypotension - resolved ESRD - on HD MWF.   HD access - had infiltration, difficult to access on 10/19. Fistulogram per IR yest 10/21 showed patent AVF w/o any areas of stenosis. AVF worked better w/ HD last night.  BP/volume - BP's wnl. At dry wt today, but c/o SOB/ dyspnea when eating. Will get CXR.  MBD ckd - cont binders ANemia ckd - was not getting esa as OP. Hb low today, getting prbc's. Ordered esa w/ darbe 60 ug q wed, 1st dose given 10/19.  IDDM -  per pmd     Rob Bradshaw 11/22/2020, 5:49 PM   Recent Labs  Lab 11/21/20 0334 11/21/20 1224 11/22/20 0332  K 4.4  --  3.9  BUN 39*  --  18  CREATININE 8.05*  --  5.09*  CALCIUM 8.4*  --  8.9  HGB 6.8* 7.4* 7.9*    Inpatient medications:  sodium chloride   Intravenous Once   Chlorhexidine Gluconate Cloth  6 each Topical Daily   darbepoetin (ARANESP) injection - DIALYSIS   60 mcg Intravenous Q Wed-HD   ferric citrate  840 mg Oral TID WC   heparin  5,000 Units Subcutaneous Q8H   hydrOXYzine  25 mg Oral Daily   insulin aspart  0-15 Units Subcutaneous Q4H   metoprolol succinate  100 mg Oral Daily   multivitamin  1 tablet Oral QHS   mupirocin ointment  1 application Topical BID   pramipexole  0.5 mg Oral Daily   rosuvastatin  40 mg Oral Daily    sodium chloride Stopped (11/19/20 1239)   acetaminophen, albuterol, docusate sodium, ferric citrate, influenza vac split quadrivalent PF, magic mouthwash, oxyCODONE-acetaminophen **AND** oxyCODONE, polyethylene glycol

## 2020-11-22 NOTE — Progress Notes (Signed)
Speech Language Pathology Treatment: Dysphagia  Patient Details Name: [name] MRN: [id number] DOB: 01-Oct-1965 Today's Date: 11/22/2020 Time: 5615-3794 SLP Time Calculation (min) (ACUTE ONLY): 15 min  Assessment / Plan / Recommendation Clinical Impression  Patient seen for skilled ST session focusing on PO readiness as patient currently NPO. RR and SpO2 both remained WFL prior to, during and after PO intake. Patient's voice was strong, able to continuously phonate at phrase, sentence, conversation. No overt s/s aspiration or penetration with consecutive straw sips of thin liquids (water) and PO intake of regular solids (graham crackers). No observed difficulty at oral phase and swallow initiation appeared timely. SLP is recommending to allow patient to start regular texture solids and thin liquids PO's. No further SLP intervention warranted but please reorder if patient exhibiting any overt s/s dysphagia.    HPI HPI: [name] is a 55 year old woman with CKD on HD who presented with R knee pain after a fall on 10/14. Developed apnea after pain meds, intubated on 10/14 until 10/18. CXR shows multifocal opacities.  VBG with hypercarbia, compensated (after intubation).  Concern for possible respiratory cause partially contributing.      SLP Plan  Continue with current plan of care      Recommendations for follow up therapy are one component of a multi-disciplinary discharge planning process, led by the attending physician.  Recommendations may be updated based on patient status, additional functional criteria and insurance authorization.    Recommendations  Diet recommendations: Regular;Thin liquid Liquids provided via: Cup;Straw Medication Administration: Whole meds with liquid Supervision: Patient able to self feed Compensations: Slow rate;Small sips/bites Postural Changes and/or Swallow Maneuvers: Seated upright 90 degrees                Oral Care Recommendations: Oral  care BID Follow up Recommendations: None SLP Visit Diagnosis: Dysphagia, oropharyngeal phase (R13.12) Plan: Continue with current plan of care       Sonia Baller, MA, CCC-SLP Speech Therapy

## 2020-11-22 NOTE — Plan of Care (Signed)

## 2020-11-22 NOTE — Progress Notes (Addendum)
\  PROGRESS NOTE    [name]  WVP:[id number] DOB: [birth year] DOA: 11/17/2020 PCP: Sandi Mariscal, MD   Brief Narrative:  55 year old female with end-stage renal disease on hemodialysis, hypertension, hyperlipidemia, COVID-19 infection, diabetes mellitus type 2, rheumatoid arthritis, anemia of chronic disease, CAD, moderate to severe mitral regurgitation, PAD, OSA, thoracic AAA presented with fall and right knee pain.  She was given IV Dilaudid in the ED for knee pain following which she developed a period of apnea, hypotension and hypoxia.  She did not respond to Narcan.  She was intubated for airway protection and was admitted to ICU.  She had to be started on Levophed drip.  She was extubated on 11/18/2020 and required BiPAP subsequently overnight for CO2 retention.  Nephrology was consulted to continue modalities.  She has been transferred to [facility] service from 11/20/2020 onwards.  10/22 patient is status post fistulogram on 10/21.  Has no complaints this AM.  Assessment & Plan:   Acute hypoxic and hypercapnic respiratory failure Iatrogenic narcotic overdose Acute toxic encephalopathy Possible aspiration pneumonia -Patient became encephalopathic with apnea, hypotension and hypoxia after IV Dilaudid use in the ED requiring intubation on 11/17/2020, brief Levophed and ICU admission. -Extubated on 11/18/2020 and required BiPAP overnight subsequently for CO2 retention -Transferred to [facility] service from 11/20/2020 onwards -Currently still on 4 L oxygen via nasal cannula; wean off as able.  Patient refused BiPAP last night. -Also on Rocephin and doxycycline for possible pneumonia: Finish 5-day course. -Mental status improving: Briefly required restraints on 11/20/2020. 10/22 on nasal cannula 5 L.  Mentation improving.   End-stage renal disease on hemodialysis AV fistula malfunction -Nephrology following.  Dialysis as per nephrology schedule.  10/22 status post fistulogram placement by IR on  10/21. Dialysis Monday Wednesday Friday  plan for dialysis   Hypotension History of hypertension 10/22 hypotension resolved Continue beta-blockers  Hyperlipidemia Continue statin  Diabetes mellitus type 2 -Continue CBGs with SSI  Anemia of chronic disease -Hemoglobin 6.8  Transfuse 1 unit packed red cells.   10/22 hemoglobin stable at 7.9 continue to monitor  On darbepoetin  Right knee pain -Secondary to fall.  Right knee x-ray showed prepatellar soft tissue swelling, tricompartmental osteoarthritis and a small joint effusion.  Avoid IV opiates  Rheumatoid arthritis -Home Humira and prednisone on hold for now.  Possibly resume on discharge.    DVT prophylaxis: Heparin Code Status: Full Family Communication: None at bedside Disposition Plan: Status is: Inpatient  Remains inpatient appropriate because: Needs IV treatment and hospitalization for severity of his illness    Consultants: PCCM/nephrology/IR  Procedures: Intubation and extubation  Antimicrobials:  Rocephin and doxycycline from 11/07/2020 onwards  Subjective: Has no complaints of shortness of breath, chest pain, dizziness  Objective: Vitals:   11/22/20 0347 11/22/20 0423 11/22/20 0738 11/22/20 0749  BP:  (!) 134/91 (!) 163/145 (!) 163/145  Pulse:  86 88 82  Resp:  17 (!) 24 16  Temp:  98.7 F (37.1 C)  98.8 F (37.1 C)  TempSrc:  Oral  Oral  SpO2:  97% (!) 83% 95%  Weight: 111.3 kg       Intake/Output Summary (Last 24 hours) at 11/22/2020 0813 Last data filed at 11/21/2020 2220 Gross per 24 hour  Intake 346 ml  Output 2500 ml  Net -2154 ml   Filed Weights   11/21/20 0345 11/21/20 1820 11/22/20 0347  Weight: 114.5 kg 114.4 kg 111.3 kg    Examination: Sitting in chair, NAD Decreased breath sounds at  bases no wheezing Regular S1-S2 no gallops Soft benign positive bowel sounds Mild edema lower extremity Grossly intact Mood and affect appropriate in current setting  Data Reviewed: I  have personally reviewed following labs and imaging studies  CBC: Recent Labs  Lab 11/17/20 1700 11/17/20 2005 11/18/20 0403 11/18/20 2045 11/19/20 0242 11/21/20 0334 11/21/20 1224 11/22/20 0332  WBC 8.3  --  7.1  --  8.6 6.0  --  5.5  NEUTROABS 6.7  --   --   --  6.8 4.5  --  4.1  HGB 8.4*   < > 7.4* 8.2* 7.0* 6.8* 7.4* 7.9*  HCT 27.3*   < > 23.7* 24.0* 22.9* 21.5* 23.1* 25.5*  MCV 101.5*  --  98.3  --  100.4* 98.2  --  98.1  PLT 264  --  226  --  199 207  --  203   < > = values in this interval not displayed.   Basic Metabolic Panel: Recent Labs  Lab 11/17/20 1700 11/17/20 2005 11/18/20 0403 11/18/20 2045 11/19/20 0242 11/21/20 0334 11/22/20 0332  NA 135   < > 136 136 136 136 137  K 4.7   < > 4.2 4.6 4.4 4.4 3.9  CL 97*  --  94*  --  94* 98 98  CO2 28  --  28  --  28 26 27   GLUCOSE 310*  --  224*  --  99 121* 112*  BUN 27*  --  34*  --  44* 39* 18  CREATININE 5.23*  --  6.10*  --  7.69* 8.05* 5.09*  CALCIUM 7.8*  --  8.2*  --  8.3* 8.4* 8.9  MG  --   --   --   --  2.0  --   --    < > = values in this interval not displayed.   GFR: Estimated Creatinine Clearance: 16.2 mL/min (A) (by C-G formula based on SCr of 5.09 mg/dL (H)). Liver Function Tests: Recent Labs  Lab 11/17/20 1700  AST 14*  ALT 14  ALKPHOS 144*  BILITOT 0.7  PROT 7.3  ALBUMIN 2.6*   No results for input(s): LIPASE, AMYLASE in the last 168 hours. No results for input(s): AMMONIA in the last 168 hours. Coagulation Profile: No results for input(s): INR, PROTIME in the last 168 hours. Cardiac Enzymes: No results for input(s): CKTOTAL, CKMB, CKMBINDEX, TROPONINI in the last 168 hours. BNP (last 3 results) No results for input(s): PROBNP in the last 8760 hours. HbA1C: No results for input(s): HGBA1C in the last 72 hours. CBG: Recent Labs  Lab 11/21/20 1601 11/21/20 2256 11/22/20 0021 11/22/20 0421 11/22/20 0800  GLUCAP 95 82 104* 104* 140*   Lipid Profile: No results for input(s):  CHOL, HDL, LDLCALC, TRIG, CHOLHDL, LDLDIRECT in the last 72 hours. Thyroid Function Tests: No results for input(s): TSH, T4TOTAL, FREET4, T3FREE, THYROIDAB in the last 72 hours.  Anemia Panel: No results for input(s): VITAMINB12, FOLATE, FERRITIN, TIBC, IRON, RETICCTPCT in the last 72 hours. Sepsis Labs: Recent Labs  Lab 11/17/20 2224 11/18/20 0035  PROCALCITON 0.67  --   LATICACIDVEN  --  1.1    Recent Results (from the past 240 hour(s))  MRSA Next Gen by PCR, Nasal     Status: None   Collection Time: 11/17/20 10:28 PM   Specimen: Nasal Mucosa; Nasal Swab  Result Value Ref Range Status   MRSA by PCR Next Gen NOT DETECTED NOT DETECTED Final    Comment: (  NOTE) The GeneXpert MRSA Assay (FDA approved for NASAL specimens only), is one component of a comprehensive MRSA colonization surveillance program. It is not intended to diagnose MRSA infection nor to guide or monitor treatment for MRSA infections. Test performance is not FDA approved in patients less than 67 years old. Performed at [facility], [place] [address] [place], [place] [address]   Culture, blood (routine x 2)     Status: None (Preliminary result)   Collection Time: 11/18/20 12:09 AM   Specimen: BLOOD RIGHT HAND  Result Value Ref Range Status   Specimen Description BLOOD RIGHT HAND  Final   Special Requests   Final    BOTTLES DRAWN AEROBIC ONLY Blood Culture adequate volume   Culture   Final    NO GROWTH 3 DAYS Performed at [facility], [place] [address], [place], [place] [address]    Report Status PENDING  Incomplete  Culture, blood (routine x 2)     Status: None (Preliminary result)   Collection Time: 11/18/20 12:19 AM   Specimen: BLOOD  Result Value Ref Range Status   Specimen Description BLOOD LEFT ANTECUBITAL  Final   Special Requests   Final    BOTTLES DRAWN AEROBIC AND ANAEROBIC Blood Culture adequate volume   Culture   Final    NO GROWTH 3 DAYS Performed at [facility], [place]  [address] [place], [place] [address]    Report Status PENDING  Incomplete  Resp Panel by RT-PCR (Flu A&B, Covid) Nasopharyngeal Swab     Status: None   Collection Time: 11/18/20  4:08 AM   Specimen: Nasopharyngeal Swab; Nasopharyngeal(NP) swabs in vial transport medium  Result Value Ref Range Status   SARS Coronavirus 2 by RT PCR NEGATIVE NEGATIVE Final    Comment: (NOTE) SARS-CoV-2 target nucleic acids are NOT DETECTED.  The SARS-CoV-2 RNA is generally detectable in upper respiratory specimens during the acute phase of infection. The lowest concentration of SARS-CoV-2 viral copies this assay can detect is 138 copies/mL. A negative result does not preclude SARS-Cov-2 infection and should not be used as the sole basis for treatment or other patient management decisions. A negative result may occur with  improper specimen collection/handling, submission of specimen other than nasopharyngeal swab, presence of viral mutation(s) within the areas targeted by this assay, and inadequate number of viral copies(<138 copies/mL). A negative result must be combined with clinical observations, patient history, and epidemiological information. The expected result is Negative.  Fact Sheet for Patients:  EntrepreneurPulse.com.au  Fact Sheet for Healthcare Providers:  IncredibleEmployment.be  This test is no t yet approved or cleared by the [place] FDA and  has been authorized for detection and/or diagnosis of SARS-CoV-2 by FDA under an Emergency Use Authorization (EUA). This EUA will remain  in effect (meaning this test can be used) for the duration of the COVID-19 declaration under Section 564(b)(1) of the Act, 21 U.S.C.section 360bbb-3(b)(1), unless the authorization is terminated  or revoked sooner.       Influenza A by PCR NEGATIVE NEGATIVE Final   Influenza B by PCR NEGATIVE NEGATIVE Final    Comment: (NOTE) The Xpert Xpress SARS-CoV-2/FLU/RSV plus  assay is intended as an aid in the diagnosis of influenza from Nasopharyngeal swab specimens and should not be used as a sole basis for treatment. Nasal washings and aspirates are unacceptable for Xpert Xpress SARS-CoV-2/FLU/RSV testing.  Fact Sheet for Patients: EntrepreneurPulse.com.au  Fact Sheet for Healthcare Providers: IncredibleEmployment.be  This test is  not yet approved or cleared by the [place] and has been authorized for detection and/or diagnosis of SARS-CoV-2 by FDA under an Emergency Use Authorization (EUA). This EUA will remain in effect (meaning this test can be used) for the duration of the COVID-19 declaration under Section 564(b)(1) of the Act, 21 U.S.C. section 360bbb-3(b)(1), unless the authorization is terminated or revoked.  Performed at [facility], [place] [address] [place], [place] [address]   Culture, Respiratory w Gram Stain     Status: None   Collection Time: 11/18/20 10:14 AM   Specimen: Tracheal Aspirate; Respiratory  Result Value Ref Range Status   Specimen Description TRACHEAL ASPIRATE  Final   Special Requests NONE  Final   Gram Stain   Final    ABUNDANT WBC PRESENT,BOTH PMN AND MONONUCLEAR RARE GRAM POSITIVE COCCI RARE GRAM VARIABLE ROD    Culture   Final    RARE Normal respiratory flora-no Staph aureus or Pseudomonas seen Performed at [facility], [address] [place], [place] [address]    Report Status 11/20/2020 FINAL  Final         Radiology Studies: No results found.      Scheduled Meds:  sodium chloride   Intravenous Once   Chlorhexidine Gluconate Cloth  6 each Topical Daily   darbepoetin (ARANESP) injection - DIALYSIS  60 mcg Intravenous Q Wed-HD   ferric citrate  840 mg Oral TID WC   heparin  5,000 Units Subcutaneous Q8H   hydrOXYzine  25 mg Oral Daily   insulin aspart  0-15 Units Subcutaneous Q4H   metoprolol succinate  100 mg Oral Daily   multivitamin   1 tablet Oral QHS   mupirocin ointment  1 application Topical BID   pramipexole  0.5 mg Oral Daily   rosuvastatin  40 mg Oral Daily   Continuous Infusions:  sodium chloride Stopped (11/19/20 1239)   doxycycline (VIBRAMYCIN) IV 100 mg (11/22/20 0110)      35 minutes with more than 50% on [place], MD [facility] 11/22/2020, 8:13 AM

## 2020-11-23 DIAGNOSIS — I4729 Other ventricular tachycardia: Secondary | ICD-10-CM

## 2020-11-23 DIAGNOSIS — J9601 Acute respiratory failure with hypoxia: Principal | ICD-10-CM

## 2020-11-23 DIAGNOSIS — J189 Pneumonia, unspecified organism: Secondary | ICD-10-CM | POA: Diagnosis not present

## 2020-11-23 DIAGNOSIS — M1731 Unilateral post-traumatic osteoarthritis, right knee: Secondary | ICD-10-CM | POA: Diagnosis not present

## 2020-11-23 DIAGNOSIS — M069 Rheumatoid arthritis, unspecified: Secondary | ICD-10-CM

## 2020-11-23 DIAGNOSIS — G9340 Encephalopathy, unspecified: Secondary | ICD-10-CM | POA: Diagnosis not present

## 2020-11-23 LAB — GLUCOSE, CAPILLARY
Glucose-Capillary: 102 mg/dL — ABNORMAL HIGH (ref 70–99)
Glucose-Capillary: 128 mg/dL — ABNORMAL HIGH (ref 70–99)
Glucose-Capillary: 132 mg/dL — ABNORMAL HIGH (ref 70–99)
Glucose-Capillary: 150 mg/dL — ABNORMAL HIGH (ref 70–99)
Glucose-Capillary: 163 mg/dL — ABNORMAL HIGH (ref 70–99)
Glucose-Capillary: 203 mg/dL — ABNORMAL HIGH (ref 70–99)
Glucose-Capillary: 222 mg/dL — ABNORMAL HIGH (ref 70–99)

## 2020-11-23 LAB — CBC
HCT: 28.7 % — ABNORMAL LOW (ref 36.0–46.0)
Hemoglobin: 8.9 g/dL — ABNORMAL LOW (ref 12.0–15.0)
MCH: 30.7 pg (ref 26.0–34.0)
MCHC: 31 g/dL (ref 30.0–36.0)
MCV: 99 fL (ref 80.0–100.0)
Platelets: 214 10*3/uL (ref 150–400)
RBC: 2.9 MIL/uL — ABNORMAL LOW (ref 3.87–5.11)
RDW: 14.5 % (ref 11.5–15.5)
WBC: 5.7 10*3/uL (ref 4.0–10.5)
nRBC: 0 % (ref 0.0–0.2)

## 2020-11-23 LAB — BASIC METABOLIC PANEL
Anion gap: 12 (ref 5–15)
BUN: 21 mg/dL — ABNORMAL HIGH (ref 6–20)
CO2: 27 mmol/L (ref 22–32)
Calcium: 8.8 mg/dL — ABNORMAL LOW (ref 8.9–10.3)
Chloride: 95 mmol/L — ABNORMAL LOW (ref 98–111)
Creatinine, Ser: 4.89 mg/dL — ABNORMAL HIGH (ref 0.44–1.00)
GFR, Estimated: 10 mL/min — ABNORMAL LOW (ref >60)
Glucose, Bld: 229 mg/dL — ABNORMAL HIGH (ref 70–99)
Potassium: 4 mmol/L (ref 3.5–5.1)
Sodium: 134 mmol/L — ABNORMAL LOW (ref 135–145)

## 2020-11-23 LAB — CULTURE, BLOOD (ROUTINE X 2)
Culture: NO GROWTH
Culture: NO GROWTH
Special Requests: ADEQUATE
Special Requests: ADEQUATE

## 2020-11-23 LAB — PHOSPHORUS: Phosphorus: 2.8 mg/dL (ref 2.5–4.6)

## 2020-11-23 LAB — MAGNESIUM: Magnesium: 2 mg/dL (ref 1.7–2.4)

## 2020-11-23 MED ORDER — HEPARIN SODIUM (PORCINE) 1000 UNIT/ML DIALYSIS: 2000.0000 [IU] | INTRAMUSCULAR | Status: DC | PRN

## 2020-11-23 MED ORDER — IPRATROPIUM-ALBUTEROL 0.5-2.5 (3) MG/3ML IN SOLN
3.0000 mL | Freq: Four times a day (QID) | RESPIRATORY_TRACT | Status: DC
  Administered 2020-11-23 – 2020-11-24 (×2): 3 mL via RESPIRATORY_TRACT
  Filled 2020-11-23 (×2): qty 3

## 2020-11-23 MED ORDER — GUAIFENESIN ER 600 MG PO TB12
600.0000 mg | ORAL_TABLET | Freq: Two times a day (BID) | ORAL | Status: DC
  Administered 2020-11-23 – 2020-11-27 (×8): 600 mg via ORAL
  Filled 2020-11-23 (×8): qty 1

## 2020-11-23 NOTE — Progress Notes (Addendum)
PROGRESS NOTE    [name]  GYK:[id number]  DOB: 1965/04/06  PCP: Sandi Mariscal, MD [place] date:11/17/2020 Chief compliant: fall, rt knee pain 55 year old female with ESRD on hemodialysis, hypertension, hyperlipidemia, COVID-19 infection, diabetes mellitus type 2, rheumatoid arthritis, anemia of chronic disease, CAD, moderate to severe mitral regurgitation, PAD, OSA, thoracic AAA presented with fall and right knee pain.  ED Course: Afebrile. She was given IV Dilaudid in the ED for knee pain following which she developed a period of apnea, hypotension and hypoxia.  She did not respond to Narcan.  She was intubated for airway protection and was admitted to ICU.   Hospital course: She had to be started on Levophed drip.  She was extubated on 11/18/2020 and required BiPAP subsequently overnight for CO2 retention.  Nephrology was consulted to continue HD.  She was transferred to [facility] service on 11/20/2020. Also on Rocephin and doxycycline for possible pneumonia: Finish 5-day course.Mental status improving: Briefly required restraints on 11/20/2020. 10/22->Still requiring O2 5 lits o2, delirium improving    Subjective:   Patient sitting in chair and talking to family members at bedside.  She wanted me to speak to her sister who is an RN-nephew put her on speaker phone during my visit.  Patient concerned about persistent cough with "gold color" phlegm.  Asking if she can get azithromycin instead of current antibiotics as recommended by her sister. Reports feeling short of breath with minimal exertion, still requiring 4 L O2.  Had additional HD yesterday after chest x-ray showed edema.  T-max 100.6 F overnight.   Objective: Vitals:   11/23/20 1000 11/23/20 1100 11/23/20 1145 11/23/20 1504  BP: (!) 113/38 (!) 126/50 127/65 116/63  Pulse: 78 78 77 75  Resp: (!) 21 (!) 26 18 17   Temp:   98.7 F (37.1 C)   TempSrc:   Oral   SpO2: 94% 100% 97% 95%  Weight:        Intake/Output Summary (Last  24 hours) at 11/23/2020 1844 Last data filed at 11/23/2020 1700 Gross per 24 hour  Intake 840 ml  Output 3000 ml  Net -2160 ml   Filed Weights   11/22/20 0347 11/22/20 2022 11/23/20 0610  Weight: 111.3 kg 113.9 kg 109 kg    Physical Examination:  General: Sitting in chair, mild distress while talking full sentences, on 4 L nasal cannula. Head ENT: Atraumatic normocephalic, PERRLA, neck supple Heart: S1-S2 heard, regular rate and rhythm, no murmurs.  1-2+ pitting leg edema noted Lungs:  no rhonchi or rales on exam, decreased breath sounds at bases, no accessory muscle use Abdomen: Bowel sounds heard, soft, nontender, nondistended. No organomegaly.  No CVA tenderness Extremities: 2+ pitting lower extremity edema with dressings along chronic stasis wounds.  No cyanosis or clubbing. Neurological: Awake alert oriented x3, no focal weakness or numbness, strength and sensations to crude touch intact  Data Reviewed: I have personally reviewed following labs and imaging studies  CBC: Recent Labs  Lab 11/17/20 1700 11/17/20 2005 11/18/20 0403 11/18/20 2045 11/19/20 0242 11/21/20 0334 11/21/20 1224 11/22/20 0332  WBC 8.3  --  7.1  --  8.6 6.0  --  5.5  NEUTROABS 6.7  --   --   --  6.8 4.5  --  4.1  HGB 8.4*   < > 7.4* 8.2* 7.0* 6.8* 7.4* 7.9*  HCT 27.3*   < > 23.7* 24.0* 22.9* 21.5* 23.1* 25.5*  MCV 101.5*  --  98.3  --  100.4* 98.2  --  98.1  PLT 264  --  226  --  199 207  --  203   < > = values in this interval not displayed.   Basic Metabolic Panel: Recent Labs  Lab 11/17/20 1700 11/17/20 2005 11/18/20 0403 11/18/20 2045 11/19/20 0242 11/21/20 0334 11/22/20 0332  NA 135   < > 136 136 136 136 137  K 4.7   < > 4.2 4.6 4.4 4.4 3.9  CL 97*  --  94*  --  94* 98 98  CO2 28  --  28  --  28 26 27   GLUCOSE 310*  --  224*  --  99 121* 112*  BUN 27*  --  34*  --  44* 39* 18  CREATININE 5.23*  --  6.10*  --  7.69* 8.05* 5.09*  CALCIUM 7.8*  --  8.2*  --  8.3* 8.4* 8.9  MG  --    --   --   --  2.0  --   --    < > = values in this interval not displayed.   GFR: Estimated Creatinine Clearance: 16 mL/min (A) (by C-G formula based on SCr of 5.09 mg/dL (H)). Liver Function Tests: Recent Labs  Lab 11/17/20 1700  AST 14*  ALT 14  ALKPHOS 144*  BILITOT 0.7  PROT 7.3  ALBUMIN 2.6*   No results for input(s): LIPASE, AMYLASE in the last 168 hours. No results for input(s): AMMONIA in the last 168 hours. Coagulation Profile: No results for input(s): INR, PROTIME in the last 168 hours. Cardiac Enzymes: No results for input(s): CKTOTAL, CKMB, CKMBINDEX, TROPONINI in the last 168 hours. BNP (last 3 results) No results for input(s): PROBNP in the last 8760 hours. HbA1C: No results for input(s): HGBA1C in the last 72 hours. CBG: Recent Labs  Lab 11/23/20 0308 11/23/20 0426 11/23/20 0922 11/23/20 1149 11/23/20 1545  GLUCAP 132* 128* 163* 150* 203*   Lipid Profile: No results for input(s): CHOL, HDL, LDLCALC, TRIG, CHOLHDL, LDLDIRECT in the last 72 hours. Thyroid Function Tests: No results for input(s): TSH, T4TOTAL, FREET4, T3FREE, THYROIDAB in the last 72 hours. Anemia Panel: No results for input(s): VITAMINB12, FOLATE, FERRITIN, TIBC, IRON, RETICCTPCT in the last 72 hours. Sepsis Labs: Recent Labs  Lab 11/17/20 2224 11/18/20 0035  PROCALCITON 0.67  --   LATICACIDVEN  --  1.1    Recent Results (from the past 240 hour(s))  MRSA Next Gen by PCR, Nasal     Status: None   Collection Time: 11/17/20 10:28 PM   Specimen: Nasal Mucosa; Nasal Swab  Result Value Ref Range Status   MRSA by PCR Next Gen NOT DETECTED NOT DETECTED Final    Comment: (NOTE) The GeneXpert MRSA Assay (FDA approved for NASAL specimens only), is one component of a comprehensive MRSA colonization surveillance program. It is not intended to diagnose MRSA infection nor to guide or monitor treatment for MRSA infections. Test performance is not FDA approved in patients less than 69  years old. Performed at [facility], [place] [address], [place], [place] [address]   Culture, blood (routine x 2)     Status: None   Collection Time: 11/18/20 12:09 AM   Specimen: BLOOD RIGHT HAND  Result Value Ref Range Status   Specimen Description BLOOD RIGHT HAND  Final   Special Requests   Final    BOTTLES DRAWN AEROBIC ONLY Blood Culture adequate volume   Culture   Final    NO GROWTH 5  DAYS Performed at [facility], [place] [address] [place], [place] [address]    Report Status 11/23/2020 FINAL  Final  Culture, blood (routine x 2)     Status: None   Collection Time: 11/18/20 12:19 AM   Specimen: BLOOD  Result Value Ref Range Status   Specimen Description BLOOD LEFT ANTECUBITAL  Final   Special Requests   Final    BOTTLES DRAWN AEROBIC AND ANAEROBIC Blood Culture adequate volume   Culture   Final    NO GROWTH 5 DAYS Performed at [facility], [place] [address], [place], [place] [address]    Report Status 11/23/2020 FINAL  Final  Resp Panel by RT-PCR (Flu A&B, Covid) Nasopharyngeal Swab     Status: None   Collection Time: 11/18/20  4:08 AM   Specimen: Nasopharyngeal Swab; Nasopharyngeal(NP) swabs in vial transport medium  Result Value Ref Range Status   SARS Coronavirus 2 by RT PCR NEGATIVE NEGATIVE Final    Comment: (NOTE) SARS-CoV-2 target nucleic acids are NOT DETECTED.  The SARS-CoV-2 RNA is generally detectable in upper respiratory specimens during the acute phase of infection. The lowest concentration of SARS-CoV-2 viral copies this assay can detect is 138 copies/mL. A negative result does not preclude SARS-Cov-2 infection and should not be used as the sole basis for treatment or other patient management decisions. A negative result may occur with  improper specimen collection/handling, submission of specimen other than nasopharyngeal swab, presence of viral mutation(s) within the areas targeted by this assay, and inadequate number of  viral copies(<138 copies/mL). A negative result must be combined with clinical observations, patient history, and epidemiological information. The expected result is Negative.  Fact Sheet for Patients:  EntrepreneurPulse.com.au  Fact Sheet for Healthcare Providers:  IncredibleEmployment.be  This test is no t yet approved or cleared by the [place] FDA and  has been authorized for detection and/or diagnosis of SARS-CoV-2 by FDA under an Emergency Use Authorization (EUA). This EUA will remain  in effect (meaning this test can be used) for the duration of the COVID-19 declaration under Section 564(b)(1) of the Act, 21 U.S.C.section 360bbb-3(b)(1), unless the authorization is terminated  or revoked sooner.       Influenza A by PCR NEGATIVE NEGATIVE Final   Influenza B by PCR NEGATIVE NEGATIVE Final    Comment: (NOTE) The Xpert Xpress SARS-CoV-2/FLU/RSV plus assay is intended as an aid in the diagnosis of influenza from Nasopharyngeal swab specimens and should not be used as a sole basis for treatment. Nasal washings and aspirates are unacceptable for Xpert Xpress SARS-CoV-2/FLU/RSV testing.  Fact Sheet for Patients: EntrepreneurPulse.com.au  Fact Sheet for Healthcare Providers: IncredibleEmployment.be  This test is not yet approved or cleared by the [place] FDA and has been authorized for detection and/or diagnosis of SARS-CoV-2 by FDA under an Emergency Use Authorization (EUA). This EUA will remain in effect (meaning this test can be used) for the duration of the COVID-19 declaration under Section 564(b)(1) of the Act, 21 U.S.C. section 360bbb-3(b)(1), unless the authorization is terminated or revoked.  Performed at [facility], [place] [address], [place], [place] [address]   Culture, Respiratory w Gram Stain     Status: None   Collection Time: 11/18/20 10:14 AM   Specimen: Tracheal  Aspirate; Respiratory  Result Value Ref Range Status   Specimen Description TRACHEAL ASPIRATE  Final   Special Requests NONE  Final   Gram Stain   Final    ABUNDANT WBC  PRESENT,BOTH PMN AND MONONUCLEAR RARE GRAM POSITIVE COCCI RARE GRAM VARIABLE ROD    Culture   Final    RARE Normal respiratory flora-no Staph aureus or Pseudomonas seen Performed at [facility], [address] [place], [place] [address]    Report Status 11/20/2020 FINAL  Final      Radiology Studies: DG CHEST PORT 1 VIEW  Result Date: 11/22/2020 CLINICAL DATA:  Dyspnea on exertion EXAM: PORTABLE CHEST 1 VIEW COMPARISON:  November 17, 2020 FINDINGS: The cardiomediastinal silhouette is unchanged and enlarged in contour.Small RIGHT pleural effusion. No pneumothorax. Diffuse interstitial prominence. Scattered LEFT basilar linear opacities most consistent with atelectasis. Visualized abdomen is unremarkable. IMPRESSION: Constellation of findings are favored to reflect pulmonary edema with scattered atelectasis. Electronically Signed   By: Valentino Saxon M.D.   On: 11/22/2020 18:08      Scheduled Meds:  sodium chloride   Intravenous Once   Chlorhexidine Gluconate Cloth  6 each Topical Daily   darbepoetin (ARANESP) injection - DIALYSIS  60 mcg Intravenous Q Wed-HD   ferric citrate  840 mg Oral TID WC   heparin  5,000 Units Subcutaneous Q8H   hydrOXYzine  25 mg Oral Daily   insulin aspart  0-15 Units Subcutaneous Q4H   metoprolol succinate  100 mg Oral Daily   multivitamin  1 tablet Oral QHS   mupirocin ointment  1 application Topical BID   pramipexole  0.5 mg Oral Daily   rosuvastatin  40 mg Oral Daily   Continuous Infusions:  sodium chloride Stopped (11/19/20 1239)     Assessment/Plan:    1.  Acute hypoxic respiratory failure: Secondary to pneumonia versus pulmonary edema.  Intubated on presentation and extubated on day 2.  Patient being treated for aspiration/community-acquired pneumonia with IV  Rocephin, doxycycline and today is day 7/7.  Patient however reports persistent productive cough and dyspnea, had fever overnight while chest x-ray yesterday suggestive of basilar atelectasis and new pulmonary edema.  Seen by nephrology and underwent extra session of hemodialysis last night with removal of 3 L of fluid.  O2 requirement came down from 5 L to 4 L today but still dyspneic.  Will repeat chest x-ray in a.m.  Will send sputum cultures.  May need to treat for ventilator associated pneumonia if continues to spike fevers or sputum culture suggestive of resistant organisms.  No evidence of leukocytosis.  Taper O2 as tolerated.  Encourage incentive spirometry, consider Acapella for atelectasis/chest congestion.  On hydroxyzine, mucolytics ordered as well.  Check echo.  Add scheduled nebs x2 days as patient complaining of wheezing, continue as needed nebs.  2.  Low-grade fever: Secondary to atelectasis versus pneumonia.  Management as above.  Will obtain repeat chest x-ray in a.m. for follow-up.  Sister who is an Therapist, sports wants [place] to try azithromycin but patient already received doxycycline for 7 days which should cover atypical organisms.  Also advised of cardiac risk especially in the setting of problem #4.  Sister verbalized understanding.  3.   Fall/right knee pain: Imaging revealed prepatellar soft tissue swelling, tricompartmental osteoarthritis and a small joint effusion.  Avoid IV opiates as led to problem #1 on admission  4.  NSVT: RN reported overnight 20 beats NSVT.  Could not retrieve telemetry strip for review.  Currently sinus tach with heart rate in the 90s, occasional PVCs on bedside monitor.  Repeat BMP today and in a.m.  Correct electrolytes.  Anemia could be contributing.  On Toprol-XL 100 mg, continue telemetry monitoring.  Check echo  5.  ESRD: Appreciate nephrology evaluation and follow-up.  Underwent additional session of hemodialysis yesterday for ultrafiltrate removal-3 L  removed.  6.  Diabetes mellitus type 2: Currently only on sliding scale insulin and blood glucose levels acceptable.  7.  Rheumatoid arthritis: Immunosuppressants (Humira and prednisone) on hold while being treated for pneumonia.  Can likely resume once concerns for infection resolved  8.  Delirium/acute metabolic encephalopathy: Briefly required restraints on 10/20.  Now improved, oriented x3 and communicative.  Avoid sedatives.  Delirium precautions.  9. Chronic leg edema, stasis wounds: In the setting of ESRD, hypoalbuminemia.  Patient feels somewhat worse and now.  Given pulmonary edema and worsening leg edema, minimize IV infusions/antibiotics.  Continue local wound care   DVT prophylaxis: Heparin Code Status: Full code Family / Patient Communication: Discussed with patient, sister on the phone Disposition Plan:   Status is: Inpatient   Remains inpatient appropriate because: Ongoing respiratory issues, oxygen needs and fever work-up    Time spent: 45 mins     >50% time spent in discussions with care team and coordination of care.    Guilford Shi, MD [facility] Pager in [place]  If 7PM-7AM, please contact night-coverage [email or web address] 11/23/2020, 6:44 PM

## 2020-11-23 NOTE — Progress Notes (Signed)
[facility] Progress Note  Subjective: had HD overnight, 3 L net UF. Pt denies feeling any better. "I'm still wheezing".   Vitals:   11/23/20 0800 11/23/20 1000 11/23/20 1100 11/23/20 1145  BP: (!) 156/85 (!) 113/38 (!) 126/50 127/65  Pulse: 75 78 78 77  Resp: 19 (!) 21 (!) 26 18  Temp:    98.7 F (37.1 C)  TempSrc:    Oral  SpO2: 95% 94% 100% 97%  Weight:        Exam:   alert, nad , obese, no distress,  nasal O2  no jvd  Chest cta bilat, no rales or wheezing  Cor reg no RG  Abd soft ntnd no ascites   Ext no LE edema   Alert, NF, ox3    RUA AVF+bruit  Home meds include - humalog 75/25 mix 25 u bid, auryxia 4 ac tid, toprol xl 100, percocet prn, mirapex , nephrovite, humira, robaxin prn, narcan prn, crestor, prns   OP HD: G-O MWF  4h  450/800   111.5kg  2/2 bath  15ga  RUA AVF Hep 10,000  - calc 1.0 ug tiw   - parsabiv 5 mg IV tiw   Assessment/ Plan: Resp failure - was on ventilator support for less than 24 hrs. CXR w/ large bibasilar infiltrates, suspected PNA. Getting IV doxy +Rocephin, day #7 of IV abx. ESRD - on HD MWF.  Had extra HD Sat night see #3 below. Next HD monday BP/volume - SOB yest and new pulm edema by CXR 10/22. Did extra HD last night w/ 3 L off. Wean O2 down and will plan another 3 L UF goal w/ HD tomorrow, is probably losing body wt.   HD access - had infiltration, was difficult to access on 10/19. Fistulogram per IR yest 10/21 showed patent AVF w/o any areas of stenosis. AVF worked better w/ HD Friday and Sat MBD ckd - cont binders Anemia ckd/ blood draws - was not getting esa as OP. Hb dropped here and she was given 1u prbc's on 10/21. Ordered esa w/ darbe 60 ug q wed, 1st dose given 10/19.  IDDM -  per pmd Shock/ hypotension - resolved     Rob Sevana Grandinetti 11/23/2020, 12:07 PM   Recent Labs  Lab 11/21/20 0334 11/21/20 1224 11/22/20 0332  K 4.4  --  3.9  BUN 39*  --  18  CREATININE 8.05*  --  5.09*  CALCIUM 8.4*  --  8.9  HGB  6.8* 7.4* 7.9*    Inpatient medications:  sodium chloride   Intravenous Once   Chlorhexidine Gluconate Cloth  6 each Topical Daily   darbepoetin (ARANESP) injection - DIALYSIS  60 mcg Intravenous Q Wed-HD   ferric citrate  840 mg Oral TID WC   heparin  5,000 Units Subcutaneous Q8H   hydrOXYzine  25 mg Oral Daily   insulin aspart  0-15 Units Subcutaneous Q4H   metoprolol succinate  100 mg Oral Daily   multivitamin  1 tablet Oral QHS   mupirocin ointment  1 application Topical BID   pramipexole  0.5 mg Oral Daily   rosuvastatin  40 mg Oral Daily    sodium chloride Stopped (11/19/20 1239)   acetaminophen, albuterol, docusate sodium, ferric citrate, influenza vac split quadrivalent PF, magic mouthwash, oxyCODONE-acetaminophen **AND** oxyCODONE, polyethylene glycol

## 2020-11-24 ENCOUNTER — Other Ambulatory Visit (HOSPITAL_COMMUNITY): Payer: Medicare Other

## 2020-11-24 DIAGNOSIS — G9340 Encephalopathy, unspecified: Secondary | ICD-10-CM | POA: Diagnosis not present

## 2020-11-24 LAB — GLUCOSE, CAPILLARY
Glucose-Capillary: 197 mg/dL — ABNORMAL HIGH (ref 70–99)
Glucose-Capillary: 198 mg/dL — ABNORMAL HIGH (ref 70–99)
Glucose-Capillary: 207 mg/dL — ABNORMAL HIGH (ref 70–99)
Glucose-Capillary: 256 mg/dL — ABNORMAL HIGH (ref 70–99)
Glucose-Capillary: 270 mg/dL — ABNORMAL HIGH (ref 70–99)
Glucose-Capillary: 321 mg/dL — ABNORMAL HIGH (ref 70–99)

## 2020-11-24 LAB — BASIC METABOLIC PANEL WITH GFR
Anion gap: 12 (ref 5–15)
BUN: 32 mg/dL — ABNORMAL HIGH (ref 6–20)
CO2: 25 mmol/L (ref 22–32)
Calcium: 8.3 mg/dL — ABNORMAL LOW (ref 8.9–10.3)
Chloride: 96 mmol/L — ABNORMAL LOW (ref 98–111)
Creatinine, Ser: 5.45 mg/dL — ABNORMAL HIGH (ref 0.44–1.00)
GFR, Estimated: 9 mL/min — ABNORMAL LOW
Glucose, Bld: 233 mg/dL — ABNORMAL HIGH (ref 70–99)
Potassium: 4.4 mmol/L (ref 3.5–5.1)
Sodium: 133 mmol/L — ABNORMAL LOW (ref 135–145)

## 2020-11-24 MED ORDER — HEPARIN SODIUM (PORCINE) 1000 UNIT/ML DIALYSIS
4000.0000 [IU] | INTRAMUSCULAR | Status: DC | PRN
  Filled 2020-11-24: qty 4

## 2020-11-24 MED ORDER — FERRIC CITRATE 1 GM 210 MG(FE) PO TABS
420.0000 mg | ORAL_TABLET | Freq: Three times a day (TID) | ORAL | Status: DC
  Administered 2020-11-24 – 2020-11-27 (×10): 420 mg via ORAL
  Filled 2020-11-24 (×11): qty 2

## 2020-11-24 MED ORDER — INSULIN GLARGINE-YFGN 100 UNIT/ML ~~LOC~~ SOLN
14.0000 [IU] | Freq: Every day | SUBCUTANEOUS | Status: DC
  Administered 2020-11-24 – 2020-11-25 (×2): 14 [IU] via SUBCUTANEOUS
  Filled 2020-11-24 (×3): qty 0.14

## 2020-11-24 MED ORDER — INSULIN ASPART 100 UNIT/ML IJ SOLN
0.0000 [IU] | Freq: Every day | INTRAMUSCULAR | Status: DC
  Administered 2020-11-24: 4 [IU] via SUBCUTANEOUS
  Administered 2020-11-26: 3 [IU] via SUBCUTANEOUS

## 2020-11-24 MED ORDER — INSULIN ASPART 100 UNIT/ML IJ SOLN
0.0000 [IU] | Freq: Three times a day (TID) | INTRAMUSCULAR | Status: DC
  Administered 2020-11-24 – 2020-11-25 (×3): 3 [IU] via SUBCUTANEOUS
  Administered 2020-11-25: 2 [IU] via SUBCUTANEOUS
  Administered 2020-11-26: 4 [IU] via SUBCUTANEOUS
  Administered 2020-11-26: 3 [IU] via SUBCUTANEOUS
  Administered 2020-11-26: 4 [IU] via SUBCUTANEOUS
  Administered 2020-11-27: 1 [IU] via SUBCUTANEOUS
  Administered 2020-11-27: 2 [IU] via SUBCUTANEOUS

## 2020-11-24 NOTE — Progress Notes (Signed)
Pt already placed on BiPAP. Pt sat 96, RT will cont to monitor.

## 2020-11-24 NOTE — Progress Notes (Signed)
[facility] NEPHROLOGY PROGRESS NOTE  Assessment/ Plan:  OP HD: G-O MWF  4h  450/800   111.5kg  2/2 bath  15ga  RUA AVF Hep 10,000  - calc 1.0 ug tiw   - parsabiv 5 mg IV tiw  #Acute hypoxic respiratory failure, suspected pneumonia.  Currently on Doxy and ceftriaxone.  Extubated.  Requiring oxygen via nasal cannula.  # ESRD, on MWF schedule.  Status post fistulogram by IR on 10/21 with patent fistula.  Regular dialysis today.  #Shock/hypotension: Resolved  # Anemia of CKD: Received PRBC, started ESA.  Monitor hemoglobin.  # Secondary hyperparathyroidism: Lower Auryxia dose as phosphorus is on the low side.  Monitor electrolytes.  # HTN/volume: Blood pressure acceptable, UF during HD.  Subjective: Seen and examined dialysis unit.  Requiring oxygen via nasal cannula.  Still having some cough and shortness of breath.  Denies chest pain, nausea or vomiting. Objective Vital signs in last 24 hours: Vitals:   11/24/20 0505 11/24/20 0751 11/24/20 0806 11/24/20 0830  BP: 116/80 131/64 (!) 144/67 (P) 137/69  Pulse: 78 76 82 (P) 70  Resp: 17 18 17  (!) (P) 23  Temp: 99.5 F (37.5 C) (!) 97.1 F (36.2 C)    TempSrc: Oral Temporal    SpO2: 97% 100%    Weight:  112.3 kg     Weight change:   Intake/Output Summary (Last 24 hours) at 11/24/2020 0907 Last data filed at 11/24/2020 0300 Gross per 24 hour  Intake 960 ml  Output 0 ml  Net 960 ml       Labs: Basic Metabolic Panel: Recent Labs  Lab 11/22/20 0332 11/23/20 1807 11/24/20 0357  NA 137 134* 133*  K 3.9 4.0 4.4  CL 98 95* 96*  CO2 27 27 25   GLUCOSE 112* 229* 233*  BUN 18 21* 32*  CREATININE 5.09* 4.89* 5.45*  CALCIUM 8.9 8.8* 8.3*  PHOS  --  2.8  --    Liver Function Tests: Recent Labs  Lab 11/17/20 1700  AST 14*  ALT 14  ALKPHOS 144*  BILITOT 0.7  PROT 7.3  ALBUMIN 2.6*   No results for input(s): LIPASE, AMYLASE in the last 168 hours. No results for input(s): AMMONIA in the last 168  hours. CBC: Recent Labs  Lab 11/18/20 0403 11/18/20 2045 11/19/20 0242 11/21/20 0334 11/21/20 1224 11/22/20 0332 11/23/20 1807  WBC 7.1  --  8.6 6.0  --  5.5 5.7  NEUTROABS  --   --  6.8 4.5  --  4.1  --   HGB 7.4*   < > 7.0* 6.8* 7.4* 7.9* 8.9*  HCT 23.7*   < > 22.9* 21.5* 23.1* 25.5* 28.7*  MCV 98.3  --  100.4* 98.2  --  98.1 99.0  PLT 226  --  199 207  --  203 214   < > = values in this interval not displayed.   Cardiac Enzymes: No results for input(s): CKTOTAL, CKMB, CKMBINDEX, TROPONINI in the last 168 hours. CBG: Recent Labs  Lab 11/23/20 1149 11/23/20 1545 11/23/20 2000 11/24/20 0030 11/24/20 0508  GLUCAP 150* 203* 222* 197* 198*    Iron Studies: No results for input(s): IRON, TIBC, TRANSFERRIN, FERRITIN in the last 72 hours. Studies/Results: DG CHEST PORT 1 VIEW  Result Date: 11/22/2020 CLINICAL DATA:  Dyspnea on exertion EXAM: PORTABLE CHEST 1 VIEW COMPARISON:  November 17, 2020 FINDINGS: The cardiomediastinal silhouette is unchanged and enlarged in contour.Small RIGHT pleural effusion. No pneumothorax. Diffuse interstitial prominence. Scattered  LEFT basilar linear opacities most consistent with atelectasis. Visualized abdomen is unremarkable. IMPRESSION: Constellation of findings are favored to reflect pulmonary edema with scattered atelectasis. Electronically Signed   By: Valentino Saxon M.D.   On: 11/22/2020 18:08    Medications: Infusions:  sodium chloride Stopped (11/19/20 1239)    Scheduled Medications:  sodium chloride   Intravenous Once   Chlorhexidine Gluconate Cloth  6 each Topical Daily   darbepoetin (ARANESP) injection - DIALYSIS  60 mcg Intravenous Q Wed-HD   ferric citrate  840 mg Oral TID WC   guaiFENesin  600 mg Oral BID   heparin  5,000 Units Subcutaneous Q8H   hydrOXYzine  25 mg Oral Daily   insulin aspart  0-15 Units Subcutaneous Q4H   ipratropium-albuterol  3 mL Nebulization QID   metoprolol succinate  100 mg Oral Daily    multivitamin  1 tablet Oral QHS   mupirocin ointment  1 application Topical BID   pramipexole  0.5 mg Oral Daily   rosuvastatin  40 mg Oral Daily    have reviewed scheduled and prn medications.  Physical Exam: General:NAD, comfortable Heart:RRR, s1s2 nl Lungs: Clear anteriorly, no increased work of breathing Abdomen:soft, Non-tender, non-distended Extremities:No edema Dialysis Access: Right upper extremity AV fistula has good thrill and bruit  Twinkle Sockwell Prasad Taresa [place] 11/24/2020,9:07 AM  LOS: 7 days

## 2020-11-24 NOTE — Progress Notes (Signed)
PT Cancellation Note  Patient Details Name: NADRA HRITZ MRN: [id number] DOB: 1965/10/19   Cancelled Treatment:    Reason Eval/Treat Not Completed: Patient at procedure or test/unavailable Pt off floor at HD. Will follow.   Marguarite Arbour A Gracie Gupta 11/24/2020, 12:01 PM Marisa Severin, PT, DPT Acute Rehabilitation Services Pager [phone] Office [phone]

## 2020-11-24 NOTE — TOC Progression Note (Addendum)
Transition of Care [facility]) - Progression Note    Patient Details  Name: [name] MRN: [id number] Date of Birth: [birth year]  Transition of [facility]) CM/SW Contact  Loletha Grayer Beverely Pace, RN Phone Number: 11/24/2020, 3:16 PM  Clinical Narrative:   Patient has no preference for [place], referral called to [facility], [facility] , patient will require oxygen for home, sats 81% on RA, required 4L to recover. [employer] will deliver oxygen to patients room and will get cannisters to home when discharged.     Expected Discharge Plan: [place] Barriers to Discharge: Continued Medical Work up  Expected Discharge Plan and Services Expected Discharge Plan: [place]   Discharge Planning Services: CM Consult Post Acute Care Choice: [place] arrangements for the past 2 months: Apartment                 DME Arranged: Oxygen,[place] DME Agency: [employer] Date DME Agency Contacted: 11/24/20 Time DME Agency Contacted: [phone] Representative spoke with at DME Agency: Freda Munro HH Arranged: PT [place]: [place] Date [place]: 11/24/20 Time [place]: [place] Representative spoke with at [place]: [place] ([place]) Interventions    Readmission Risk Interventions Readmission Risk Prevention Plan 02/28/2020  Transportation Screening Complete  PCP or Specialist Appt within 5-7 Days Complete  Home Care Screening Complete  Medication Review (RN CM) Complete  Some recent data might be hidden

## 2020-11-24 NOTE — Plan of Care (Signed)

## 2020-11-24 NOTE — Progress Notes (Signed)
Inpatient Diabetes Program Recommendations  AACE/ADA: New Consensus Statement on Inpatient Glycemic Control (2015)  Target Ranges:  Prepandial:   less than 140 mg/dL      Peak postprandial:   less than 180 mg/dL (1-2 hours)      Critically ill patients:  140 - 180 mg/dL   Lab Results  Component Value Date   GLUCAP 198 (H) 11/24/2020   HGBA1C 9.7 (A) 05/01/2020    Review of Glycemic Control Results for [name], [name] (MRN [id number]) as of 11/24/2020 10:45  Ref. Range 11/23/2020 15:45 11/23/2020 20:00 11/24/2020 00:30 11/24/2020 05:08  Glucose-Capillary Latest Ref Range: 70 - 99 mg/dL 203 (H) 222 (H) 197 (H) 198 (H)   Diabetes history: Type 2 DM Outpatient Diabetes medications: Humalog 75/25 35 units BID Current orders for Inpatient glycemic control: Novolog 0-15 units Q4H  Inpatient Diabetes Program Recommendations:     Consider adding Semglee 14 units QD and changing correction to Novolog 0-6 units TID & HS.   Thanks, Bronson Curb, MSN, RNC-OB Diabetes Coordinator [phone] (8a-5p)

## 2020-11-24 NOTE — Progress Notes (Signed)
PROGRESS NOTE    [name]  JME:[id number] DOB: 24-Jul-1965 DOA: 11/17/2020 PCP: Sandi Mariscal, MD    Brief Narrative:  ESRD on hemodialysis, hypertension hyperlipidemia, type 2 diabetes on insulin, rheumatoid arthritis on Humira, anemia of chronic disease, obstructive sleep apnea with inconclusive sleep study presented to the hospital with fall and right knee pain.  She was given a dose of Dilaudid in the ER for knee pain following which she developed a period of apnea, hypertension and hypoxia where she was intubated for airway protection and admitted to ICU. 10/18-extubated to BiPAP, hemodialysis.  Transferred to [facility] service on 10/20.  Treated for aspiration pneumonia with Rocephin and doxycycline.  Mental status improving.   Assessment & Plan:   Active Problems:   Encephalopathy acute  Acute hypoxemic respiratory failure: Suspect secondary to pneumonia versus pulmonary edema.  Intubated on mechanical ventilation, extubated on day 2. Currently remains on nasal cannula oxygen to keep saturation more than 90%, using BiPAP at night.  Subsequent hemodialysis.  Respiratory therapy.  Mobility.  Incentive spirometry.  Completed antibiotic course. -May need oxygen therapy on discharge, will check for ambulatory oxygen need. -Clinical evidence of sleep apnea, incomplete sleep apnea studies last month, pulmonary will reschedule follow-up.  With current incompletely study and normal blood gas, she does not qualify for noninvasive ventilator.  Will discharge home with oxygen and refer back to pulmonary for follow-up.  ESRD on hemodialysis: Getting dialysis on a schedule.  She is receiving additional hemodialysis.  Fall and right knee pain: Soft tissue injury.  Avoid opiates.  Type 2 diabetes on insulin: Currently on reduced dose of insulin.  Appetite is improving.  Start long-acting insulin.  Delirium and acute metabolic encephalopathy: Due to ICU delirium.  Improved.  Rheumatoid arthritis:  On chronic Humira and prednisone.  Resume on discharge.   DVT prophylaxis: heparin injection 5,000 Units Start: 11/17/20 2230 SCDs Start: 11/17/20 2225   Code Status: Full code Family Communication: None at the bedside Disposition Plan: Status is: Inpatient  Remains inpatient appropriate because: Still on significant oxygen.  Hemodialysis needed.  Mobility evaluation pending.         Consultants:  Nephrology Critical care  Procedures:  None  Antimicrobials:  Completed   Subjective: Patient seen and examined.  Came back from hemodialysis.  Currently on 4 to 5 L oxygen.  Denies any chest pain or shortness of breath.  Mild right knee pain improved now. She thinks she will need a BiPAP to go home.  She could not complete her earlier study because she could not fall asleep.  Wants to talk to case management.  Objective: Vitals:   11/24/20 1100 11/24/20 1136 11/24/20 1141 11/24/20 1233  BP: (!) 114/51 (!) 144/64 (!) 144/69   Pulse:   79 80  Resp: (!) 22 19 19 19   Temp:   98.8 F (37.1 C)   TempSrc:   Oral   SpO2:   95% 96%  Weight:        Intake/Output Summary (Last 24 hours) at 11/24/2020 1312 Last data filed at 11/24/2020 1141 Gross per 24 hour  Intake 720 ml  Output 2076 ml  Net -1356 ml   Filed Weights   11/22/20 2022 11/23/20 0610 11/24/20 0751  Weight: 113.9 kg 109 kg 112.3 kg    Examination:  General exam: Appears calm and comfortable  Sitting at the edge of the bed and trying to eat his lunch.  On 4 L oxygen.  Not in any obvious distress. Respiratory  system: Clear to auscultation. Respiratory effort normal.  Mostly bilateral clear. Cardiovascular system: S1 & S2 heard, RRR.  Chronic venous stasis changes and stasis ulcers both lower extremities. Gastrointestinal system: Soft.  Nontender.  Bowel sounds present. Central nervous system: Alert and oriented. No focal neurological deficits. Both upper arms with AV fistula.    Data Reviewed: I have  personally reviewed following labs and imaging studies  CBC: Recent Labs  Lab 11/17/20 1700 11/17/20 2005 11/18/20 0403 11/18/20 2045 11/19/20 0242 11/21/20 0334 11/21/20 1224 11/22/20 0332 11/23/20 1807  WBC 8.3  --  7.1  --  8.6 6.0  --  5.5 5.7  NEUTROABS 6.7  --   --   --  6.8 4.5  --  4.1  --   HGB 8.4*   < > 7.4*   < > 7.0* 6.8* 7.4* 7.9* 8.9*  HCT 27.3*   < > 23.7*   < > 22.9* 21.5* 23.1* 25.5* 28.7*  MCV 101.5*  --  98.3  --  100.4* 98.2  --  98.1 99.0  PLT 264  --  226  --  199 207  --  203 214   < > = values in this interval not displayed.   Basic Metabolic Panel: Recent Labs  Lab 11/19/20 0242 11/21/20 0334 11/22/20 0332 11/23/20 1807 11/24/20 0357  NA 136 136 137 134* 133*  K 4.4 4.4 3.9 4.0 4.4  CL 94* 98 98 95* 96*  CO2 28 26 27 27 25   GLUCOSE 99 121* 112* 229* 233*  BUN 44* 39* 18 21* 32*  CREATININE 7.69* 8.05* 5.09* 4.89* 5.45*  CALCIUM 8.3* 8.4* 8.9 8.8* 8.3*  MG 2.0  --   --  2.0  --   PHOS  --   --   --  2.8  --    GFR: Estimated Creatinine Clearance: 15.2 mL/min (A) (by C-G formula based on SCr of 5.45 mg/dL (H)). Liver Function Tests: Recent Labs  Lab 11/17/20 1700  AST 14*  ALT 14  ALKPHOS 144*  BILITOT 0.7  PROT 7.3  ALBUMIN 2.6*   No results for input(s): LIPASE, AMYLASE in the last 168 hours. No results for input(s): AMMONIA in the last 168 hours. Coagulation Profile: No results for input(s): INR, PROTIME in the last 168 hours. Cardiac Enzymes: No results for input(s): CKTOTAL, CKMB, CKMBINDEX, TROPONINI in the last 168 hours. BNP (last 3 results) No results for input(s): PROBNP in the last 8760 hours. HbA1C: No results for input(s): HGBA1C in the last 72 hours. CBG: Recent Labs  Lab 11/23/20 1545 11/23/20 2000 11/24/20 0030 11/24/20 0508 11/24/20 1210  GLUCAP 203* 222* 197* 198* 207*   Lipid Profile: No results for input(s): CHOL, HDL, LDLCALC, TRIG, CHOLHDL, LDLDIRECT in the last 72 hours. Thyroid Function  Tests: No results for input(s): TSH, T4TOTAL, FREET4, T3FREE, THYROIDAB in the last 72 hours. Anemia Panel: No results for input(s): VITAMINB12, FOLATE, FERRITIN, TIBC, IRON, RETICCTPCT in the last 72 hours. Sepsis Labs: Recent Labs  Lab 11/17/20 2224 11/18/20 0035  PROCALCITON 0.67  --   LATICACIDVEN  --  1.1    Recent Results (from the past 240 hour(s))  MRSA Next Gen by PCR, Nasal     Status: None   Collection Time: 11/17/20 10:28 PM   Specimen: Nasal Mucosa; Nasal Swab  Result Value Ref Range Status   MRSA by PCR Next Gen NOT DETECTED NOT DETECTED Final    Comment: (NOTE) The GeneXpert MRSA Assay (FDA approved for  NASAL specimens only), is one component of a comprehensive MRSA colonization surveillance program. It is not intended to diagnose MRSA infection nor to guide or monitor treatment for MRSA infections. Test performance is not FDA approved in patients less than 11 years old. Performed at [facility], [place] [address] [place], [place] [address]   Culture, blood (routine x 2)     Status: None   Collection Time: 11/18/20 12:09 AM   Specimen: BLOOD RIGHT HAND  Result Value Ref Range Status   Specimen Description BLOOD RIGHT HAND  Final   Special Requests   Final    BOTTLES DRAWN AEROBIC ONLY Blood Culture adequate volume   Culture   Final    NO GROWTH 5 DAYS Performed at [facility], [address] [place], [place] [address]    Report Status 11/23/2020 FINAL  Final  Culture, blood (routine x 2)     Status: None   Collection Time: 11/18/20 12:19 AM   Specimen: BLOOD  Result Value Ref Range Status   Specimen Description BLOOD LEFT ANTECUBITAL  Final   Special Requests   Final    BOTTLES DRAWN AEROBIC AND ANAEROBIC Blood Culture adequate volume   Culture   Final    NO GROWTH 5 DAYS Performed at [facility], [place] [address] [place], [place] [address]    Report Status 11/23/2020 FINAL  Final  Resp Panel by RT-PCR (Flu A&B, Covid)  Nasopharyngeal Swab     Status: None   Collection Time: 11/18/20  4:08 AM   Specimen: Nasopharyngeal Swab; Nasopharyngeal(NP) swabs in vial transport medium  Result Value Ref Range Status   SARS Coronavirus 2 by RT PCR NEGATIVE NEGATIVE Final    Comment: (NOTE) SARS-CoV-2 target nucleic acids are NOT DETECTED.  The SARS-CoV-2 RNA is generally detectable in upper respiratory specimens during the acute phase of infection. The lowest concentration of SARS-CoV-2 viral copies this assay can detect is 138 copies/mL. A negative result does not preclude SARS-Cov-2 infection and should not be used as the sole basis for treatment or other patient management decisions. A negative result may occur with  improper specimen collection/handling, submission of specimen other than nasopharyngeal swab, presence of viral mutation(s) within the areas targeted by this assay, and inadequate number of viral copies(<138 copies/mL). A negative result must be combined with clinical observations, patient history, and epidemiological information. The expected result is Negative.  Fact Sheet for Patients:  EntrepreneurPulse.com.au  Fact Sheet for Healthcare Providers:  IncredibleEmployment.be  This test is no t yet approved or cleared by the [place] FDA and  has been authorized for detection and/or diagnosis of SARS-CoV-2 by FDA under an Emergency Use Authorization (EUA). This EUA will remain  in effect (meaning this test can be used) for the duration of the COVID-19 declaration under Section 564(b)(1) of the Act, 21 U.S.C.section 360bbb-3(b)(1), unless the authorization is terminated  or revoked sooner.       Influenza A by PCR NEGATIVE NEGATIVE Final   Influenza B by PCR NEGATIVE NEGATIVE Final    Comment: (NOTE) The Xpert Xpress SARS-CoV-2/FLU/RSV plus assay is intended as an aid in the diagnosis of influenza from Nasopharyngeal swab specimens and should not be  used as a sole basis for treatment. Nasal washings and aspirates are unacceptable for Xpert Xpress SARS-CoV-2/FLU/RSV testing.  Fact Sheet for Patients: EntrepreneurPulse.com.au  Fact Sheet for Healthcare Providers: IncredibleEmployment.be  This test is not yet approved or cleared by the [place] FDA  and has been authorized for detection and/or diagnosis of SARS-CoV-2 by FDA under an Emergency Use Authorization (EUA). This EUA will remain in effect (meaning this test can be used) for the duration of the COVID-19 declaration under Section 564(b)(1) of the Act, 21 U.S.C. section 360bbb-3(b)(1), unless the authorization is terminated or revoked.  Performed at [facility], [place] [address] [place], [place] [address]   Culture, Respiratory w Gram Stain     Status: None   Collection Time: 11/18/20 10:14 AM   Specimen: Tracheal Aspirate; Respiratory  Result Value Ref Range Status   Specimen Description TRACHEAL ASPIRATE  Final   Special Requests NONE  Final   Gram Stain   Final    ABUNDANT WBC PRESENT,BOTH PMN AND MONONUCLEAR RARE GRAM POSITIVE COCCI RARE GRAM VARIABLE ROD    Culture   Final    RARE Normal respiratory flora-no Staph aureus or Pseudomonas seen Performed at [facility], [address] [place], [place] [address]    Report Status 11/20/2020 FINAL  Final         Radiology Studies: DG CHEST PORT 1 VIEW  Result Date: 11/22/2020 CLINICAL DATA:  Dyspnea on exertion EXAM: PORTABLE CHEST 1 VIEW COMPARISON:  November 17, 2020 FINDINGS: The cardiomediastinal silhouette is unchanged and enlarged in contour.Small RIGHT pleural effusion. No pneumothorax. Diffuse interstitial prominence. Scattered LEFT basilar linear opacities most consistent with atelectasis. Visualized abdomen is unremarkable. IMPRESSION: Constellation of findings are favored to reflect pulmonary edema with scattered atelectasis. Electronically Signed    By: Valentino Saxon M.D.   On: 11/22/2020 18:08        Scheduled Meds:  sodium chloride   Intravenous Once   Chlorhexidine Gluconate Cloth  6 each Topical Daily   darbepoetin (ARANESP) injection - DIALYSIS  60 mcg Intravenous Q Wed-HD   ferric citrate  420 mg Oral TID WC   guaiFENesin  600 mg Oral BID   heparin  5,000 Units Subcutaneous Q8H   hydrOXYzine  25 mg Oral Daily   insulin aspart  0-5 Units Subcutaneous QHS   insulin aspart  0-6 Units Subcutaneous TID WC   insulin glargine-yfgn  14 Units Subcutaneous QHS   metoprolol succinate  100 mg Oral Daily   multivitamin  1 tablet Oral QHS   mupirocin ointment  1 application Topical BID   pramipexole  0.5 mg Oral Daily   rosuvastatin  40 mg Oral Daily   Continuous Infusions:  sodium chloride Stopped (11/19/20 1239)     LOS: 7 days    Time spent: 34 minutes    Barb Merino, MD [facility] Pager [phone]

## 2020-11-24 NOTE — Progress Notes (Signed)
Physical Therapy Treatment Patient Details Name: [name] MRN: [id number] DOB: 1965-10-18 Today's Date: 11/24/2020   History of Present Illness 55 yo female presents to [facility] on 10/17 from HD with R knee pain since fall on 10/14. Worsening mental status and apnea in ED possibly due to narcotic overdose narcan administered, ETT 10/17-10/18, requiring bipap at night for retained CO2. CXR shows bilat infiltrates suspect PNA. AVF issues with early termination of HD on 10/19. Fall OOB 10/20.  PMH includes anxiety, RA, CAD, HF, ESRD on HD MWF, HTN, RLS, sleep apnea, thoracic ascending aortic aneurysm, DM II with retinopathy, obesity, tobacco abuse, L thumb amputation.    PT Comments    Patient progressing well towards PT goals. Reports knee pain is improved as pt able to place weight through it. Tolerated short distance ambulation with Min A and use of RW for support, no buckling noted. Continues to have difficulty with balance and poor proprioception due to neuropathy and impaired sensation in bil feet. Pt with LOB posteriorly standing by bed needing assist to come forward, "push me forward." Continues to be a fall risk. SP02 dropped to 84% on RA sitting EOB, donned 3L/min 02 [place] and able to maintain Sp02 >90% during activity. Will continue to follow and progress.    Recommendations for follow up therapy are one component of a multi-disciplinary discharge planning process, led by the attending physician.  Recommendations may be updated based on patient status, additional functional criteria and insurance authorization.  Follow Up Recommendations  Home health PT     Assistance Recommended at Discharge Frequent or constant Supervision/Assistance  Equipment Recommendations  Rollator (4 wheels) (smaller rollator, not the wide one (does not fit through her doors))    Recommendations for Other Services       Precautions / Restrictions Precautions Precautions: Fall;Other (comment) Precaution  Comments: watch 02 Restrictions Weight Bearing Restrictions: No     Mobility  Bed Mobility               General bed mobility comments: Sitting EOB upon PT arrival. Coming down onto forearm a few times during session.    Transfers Overall transfer level: Needs assistance Equipment used: Rolling walker (2 wheels) Transfers: Sit to/from Stand Sit to Stand: Min assist           General transfer comment: min assist to rise and steady, cues for hand placement when rising. Posterior bias. Performed x2 from EOB.    Ambulation/Gait Ambulation/Gait assistance: Min guard Gait Distance (Feet): 25 Feet Assistive device: Rolling walker (2 wheels) Gait Pattern/deviations: Step-through pattern;Shuffle;Trunk flexed Gait velocity: decr   General Gait Details: Slow, guarded gait with RW for support; Sp02 remained >90% on 3L/min 02 [place], took off prior to mobilizing and dropped to 84% on RA.   Stairs             Wheelchair Mobility    Modified Rankin (Stroke Patients Only)       Balance Overall balance assessment: Needs assistance;History of Falls Sitting-balance support: Feet supported;No upper extremity supported Sitting balance-Leahy Scale: Fair Sitting balance - Comments: Fatigues and rests down on forearm on bed at times.   Standing balance support: During functional activity Standing balance-Leahy Scale: Poor Standing balance comment: reliant on external support and UE support; posterior LOB "push me forward" assist to donn pants.                            Cognition Arousal/Alertness:  Awake/alert Behavior During Therapy: WFL for tasks assessed/performed Overall Cognitive Status: No family/caregiver present to determine baseline cognitive functioning                       Memory: Decreased short-term memory         General Comments: Seems WFL for basic mobility tasks. Aware that she does not recall events getting to hospital.         Exercises      General Comments General comments (skin integrity, edema, etc.): SP02 dropped to 84% on RA sitting EOB, donned 3L and able to maintain >90% with mobility.      Pertinent Vitals/Pain Pain Assessment: No/denies pain    Home Living                          Prior Function            PT Goals (current goals can now be found in the care plan section) Progress towards PT goals: Progressing toward goals    Frequency    Min 3X/week      PT Plan Current plan remains appropriate    Co-evaluation              AM-PAC PT "6 Clicks" Mobility   Outcome Measure  Help needed turning from your back to your side while in a flat bed without using bedrails?: A Little Help needed moving from lying on your back to sitting on the side of a flat bed without using bedrails?: A Little Help needed moving to and from a bed to a chair (including a wheelchair)?: A Little Help needed standing up from a chair using your arms (e.g., wheelchair or bedside chair)?: A Little Help needed to walk in hospital room?: A Little Help needed climbing 3-5 steps with a railing? : A Lot 6 Click Score: 17    End of Session Equipment Utilized During Treatment: Oxygen;Gait belt Activity Tolerance: Patient tolerated treatment well Patient left: in bed;with call bell/phone within reach;with bed alarm set (sitting EOB) Nurse Communication: Mobility status PT Visit Diagnosis: Other abnormalities of gait and mobility (R26.89);Muscle weakness (generalized) (M62.81)     Time: 0786-7544 PT Time Calculation (min) (ACUTE ONLY): 19 min  Charges:  $Therapeutic Activity: 8-22 mins                     Marisa Severin, PT, DPT Acute Rehabilitation Services Pager [phone] Office [phone]      Marguarite Arbour A Sabra Heck 11/24/2020, 3:32 PM

## 2020-11-24 NOTE — Progress Notes (Signed)
SATURATION QUALIFICATIONS: (This note is used to comply with regulatory documentation for home oxygen)  Patient Saturations on Room Air at Rest = 90%  Patient Saturations on Room Air while Ambulating = 81%  Patient Saturations on 4 Liters of oxygen while Ambulating = 92%  Please briefly explain why patient needs home oxygen: Patient became very short of breath while ambulating on RA, resolved with oxygen and rest.

## 2020-11-24 NOTE — TOC Progression Note (Addendum)
Transition of Care [facility]) - Progression Note    Patient Details  Name: [name] MRN: [id number] Date of Birth: May 31, 1965  [facility] CM/SW Contact  Loletha Grayer Beverely Pace, RN Phone Number: 11/24/2020, 1:41 PM  Clinical Narrative:   Patient requested to speak with Case Manager about getting a BIPAP for home. CM explained that her sleep study was inconclusive per Md note,and we can not order BIPAP. Also explained that we will be checking her oxygen levels to see if she will need oxygen for home. MD requested that CM find out if patient qualifies for Trilogy. CM contacted Zack with [employer] and awaits his return call.  1445: Zack called and states that patient currently doesn't qualify for Trilogy, needs to return to the MD that ordered her sleep study.CM notified dr. Raelyn Mora.        Expected Discharge Plan and Services                                                 Social Determinants of Health (SDOH) Interventions    Readmission Risk Interventions Readmission Risk Prevention Plan 02/28/2020  Transportation Screening Complete  PCP or Specialist Appt within 5-7 Days Complete  Home Care Screening Complete  Medication Review (RN CM) Complete  Some recent data might be hidden

## 2020-11-25 ENCOUNTER — Inpatient Hospital Stay (HOSPITAL_COMMUNITY): Payer: Medicare Other

## 2020-11-25 DIAGNOSIS — I5030 Unspecified diastolic (congestive) heart failure: Secondary | ICD-10-CM

## 2020-11-25 DIAGNOSIS — G9340 Encephalopathy, unspecified: Secondary | ICD-10-CM | POA: Diagnosis not present

## 2020-11-25 LAB — POCT I-STAT 7, (LYTES, BLD GAS, ICA,H+H)
Acid-Base Excess: 2 mmol/L (ref 0.0–2.0)
Bicarbonate: 30 mmol/L — ABNORMAL HIGH (ref 20.0–28.0)
Calcium, Ion: 1.21 mmol/L (ref 1.15–1.40)
HCT: 29 % — ABNORMAL LOW (ref 36.0–46.0)
Hemoglobin: 9.9 g/dL — ABNORMAL LOW (ref 12.0–15.0)
O2 Saturation: 69 %
Patient temperature: 98.1
Potassium: 4.1 mmol/L (ref 3.5–5.1)
Sodium: 132 mmol/L — ABNORMAL LOW (ref 135–145)
TCO2: 32 mmol/L (ref 22–32)
pCO2 arterial: 63.2 mmHg — ABNORMAL HIGH (ref 32.0–48.0)
pH, Arterial: 7.284 — ABNORMAL LOW (ref 7.350–7.450)
pO2, Arterial: 41 mmHg — ABNORMAL LOW (ref 83.0–108.0)

## 2020-11-25 LAB — GLUCOSE, CAPILLARY
Glucose-Capillary: 259 mg/dL — ABNORMAL HIGH (ref 70–99)
Glucose-Capillary: 205 mg/dL — ABNORMAL HIGH (ref 70–99)
Glucose-Capillary: 263 mg/dL — ABNORMAL HIGH (ref 70–99)
Glucose-Capillary: 285 mg/dL — ABNORMAL HIGH (ref 70–99)
Glucose-Capillary: 258 mg/dL — ABNORMAL HIGH (ref 70–99)

## 2020-11-25 LAB — CBC
HCT: 27.2 % — ABNORMAL LOW (ref 36.0–46.0)
Hemoglobin: 8.3 g/dL — ABNORMAL LOW (ref 12.0–15.0)
MCH: 30.6 pg (ref 26.0–34.0)
MCHC: 30.5 g/dL (ref 30.0–36.0)
MCV: 100.4 fL — ABNORMAL HIGH (ref 80.0–100.0)
Platelets: 214 10*3/uL (ref 150–400)
RBC: 2.71 MIL/uL — ABNORMAL LOW (ref 3.87–5.11)
RDW: 14.4 % (ref 11.5–15.5)
WBC: 6.2 10*3/uL (ref 4.0–10.5)
nRBC: 0 % (ref 0.0–0.2)

## 2020-11-25 LAB — ECHOCARDIOGRAM COMPLETE
Area-P 1/2: 3.31 cm2
Calc EF: 61.7 %
S' Lateral: 3.4 cm
Single Plane A2C EF: 63.1 %
Single Plane A4C EF: 61.7 %
Weight: 3961.23 oz

## 2020-11-25 LAB — RENAL FUNCTION PANEL
Albumin: 2.7 g/dL — ABNORMAL LOW (ref 3.5–5.0)
Anion gap: 10 (ref 5–15)
BUN: 34 mg/dL — ABNORMAL HIGH (ref 6–20)
CO2: 28 mmol/L (ref 22–32)
Calcium: 8.5 mg/dL — ABNORMAL LOW (ref 8.9–10.3)
Chloride: 92 mmol/L — ABNORMAL LOW (ref 98–111)
Creatinine, Ser: 4.92 mg/dL — ABNORMAL HIGH (ref 0.44–1.00)
GFR, Estimated: 10 mL/min — ABNORMAL LOW (ref >60)
Glucose, Bld: 349 mg/dL — ABNORMAL HIGH (ref 70–99)
Phosphorus: 3.1 mg/dL (ref 2.5–4.6)
Potassium: 4.2 mmol/L (ref 3.5–5.1)
Sodium: 130 mmol/L — ABNORMAL LOW (ref 135–145)

## 2020-11-25 MED ORDER — CHLORHEXIDINE GLUCONATE CLOTH 2 % EX PADS
6.0000 | MEDICATED_PAD | Freq: Every day | CUTANEOUS | Status: DC
  Administered 2020-11-25: 6 via TOPICAL

## 2020-11-25 MED ORDER — LIDOCAINE-PRILOCAINE 2.5-2.5 % EX CREA
1.0000 "application " | TOPICAL_CREAM | CUTANEOUS | Status: DC | PRN
  Filled 2020-11-25: qty 5

## 2020-11-25 MED ORDER — PENTAFLUOROPROP-TETRAFLUOROETH EX AERO: 1.0000 "application " | INHALATION_SPRAY | CUTANEOUS | Status: DC | PRN

## 2020-11-25 MED ORDER — HEPARIN SODIUM (PORCINE) 1000 UNIT/ML DIALYSIS: 1000.0000 [IU] | INTRAMUSCULAR | Status: DC | PRN

## 2020-11-25 MED ORDER — IOHEXOL 350 MG/ML SOLN
75.0000 mL | Freq: Once | INTRAVENOUS | Status: AC | PRN
  Administered 2020-11-25: 75 mL via INTRAVENOUS

## 2020-11-25 MED ORDER — SODIUM CHLORIDE 0.9 % IV SOLN: 100.0000 mL | INTRAVENOUS | Status: DC | PRN

## 2020-11-25 MED ORDER — GUAIFENESIN 100 MG/5ML PO LIQD
5.0000 mL | Freq: Once | ORAL | Status: AC
  Administered 2020-11-25: 5 mL via ORAL
  Filled 2020-11-25: qty 10

## 2020-11-25 MED ORDER — HEPARIN SODIUM (PORCINE) 1000 UNIT/ML DIALYSIS: 20.0000 [IU]/kg | INTRAMUSCULAR | Status: DC | PRN

## 2020-11-25 MED ORDER — ALTEPLASE 2 MG IJ SOLR: 2.0000 mg | Freq: Once | INTRAMUSCULAR | Status: DC | PRN

## 2020-11-25 MED ORDER — LIDOCAINE HCL (PF) 1 % IJ SOLN: 5.0000 mL | INTRAMUSCULAR | Status: DC | PRN

## 2020-11-25 MED ORDER — CHLORHEXIDINE GLUCONATE CLOTH 2 % EX PADS: 6.0000 | MEDICATED_PAD | Freq: Every day | CUTANEOUS | Status: DC

## 2020-11-25 NOTE — Progress Notes (Signed)
Patient to dialysis via bed.  Report given to dialysis nurse.

## 2020-11-25 NOTE — Progress Notes (Signed)
OT Cancellation Note  Patient Details Name: [name] MRN: [id number] DOB: 01/23/1966   Cancelled Treatment:    Reason Eval/Treat Not Completed: Patient at procedure or test/ unavailable (CT)  [place], OT/L   Acute OT Clinical Specialist [place] Pager [phone] Office [phone]  11/25/2020, 2:04 PM

## 2020-11-25 NOTE — Progress Notes (Signed)
  [facility] Progress Note   Subjective:   Asked to evaluate patient for volume overload. Pt does not feel we are pulling enough fluid off with HD, typically removes 4-5L outpatient without issue. She is still feeling SOB, denies cough and CP. CXR today shows diffuse bilateral interstitial opacity, likely reflecting edema. CT scan with patchy ground glass opacities in both lungs.    Objective Vitals:   11/25/20 0355 11/25/20 0358 11/25/20 0739 11/25/20 1111  BP: 106/63  133/70 139/61  Pulse: 65  72 73  Resp: 20  16 15   Temp: 97.7 F (36.5 C)  98 F (36.7 C) 98.1 F (36.7 C)  TempSrc: Oral  Oral Oral  SpO2: 97% 96% 94% 97%  Weight:       Plan: We will plan extra HD today. She would like to attempt 4L UF, will attempt 4L as blood pressure allows.   Anice Paganini, PA-C 11/25/2020, 3:18 PM  [facility] Pager: [phone]

## 2020-11-25 NOTE — Progress Notes (Signed)
PROGRESS NOTE    [name]  WJX:[id number] DOB: 05/28/1965 DOA: 11/17/2020 PCP: Sandi Mariscal, MD    Brief Narrative:  ESRD on hemodialysis, hypertension hyperlipidemia, type 2 diabetes on insulin, rheumatoid arthritis on Humira, anemia of chronic disease, obstructive sleep apnea with inconclusive sleep study presented to the hospital with fall and right knee pain.  She was given a dose of Dilaudid in the ER for knee pain following which she developed a period of apnea, hypertension and hypoxia where she was intubated for airway protection and admitted to ICU. 10/18-extubated to BiPAP, hemodialysis.  Transferred to [facility] service on 10/20.  Treated for aspiration pneumonia with Rocephin and doxycycline.  Mental status improving but she is requiring persistent BiPAP and oxygen.   Assessment & Plan:   Active Problems:   Encephalopathy acute  Acute hypoxemic respiratory failure: Suspect secondary to pneumonia versus pulmonary edema.  Also with underlying sleep apnea.  Intubated on mechanical ventilation, extubated on day 2. Currently remains on nasal cannula oxygen to keep saturation more than 90%, using BiPAP at night.  Subsequent hemodialysis.  Respiratory therapy.  Mobility.  Incentive spirometry.  Completed antibiotic course. -Discharging home with oxygen. -Clinical evidence of sleep apnea, incomplete sleep apnea studies last month, pulmonary will reschedule follow-up.  With current incompletely study and normal blood gas, she does not qualify for noninvasive ventilator.  Will discharge home with oxygen and refer back to pulmonary for follow-up. -We will check for repeat ABG in the morning.  Also check CT scan of the chest to rule out pulmonary embolism/any complicated pneumonia.  ESRD on hemodialysis: Getting dialysis on schedule.  May need extra sessions of dialysis.  Will check chest x-ray today.  Fall and right knee pain: Soft tissue injury.  Avoid opiates.  Type 2 diabetes on  insulin: Currently on reduced dose of insulin.  Appetite is improving.  Started on long-acting insulin.  Keep on similar dose today.  Delirium and acute metabolic encephalopathy: Due to ICU delirium.  Improved.  Rheumatoid arthritis: On chronic Humira and prednisone.  Resume prednisone.  Humira as outpatient.   DVT prophylaxis: heparin injection 5,000 Units Start: 11/17/20 2230 SCDs Start: 11/17/20 2225   Code Status: Full code Family Communication: Sister on the phone. Disposition Plan: Status is: Inpatient  Remains inpatient appropriate because: Still on significant oxygen.  Hemodialysis needed.  Still using BiPAP.         Consultants:  Nephrology Critical care  Procedures:  None  Antimicrobials:  Completed   Subjective: Patient seen and examined.  Early morning she was very sleepy and snoring.  Reported shortness of breath and went on BiPAP.  Later on she awake on 4 L oxygen. Extra-large family size of chips bag she is eating from. She is asking me to call an ambulance to take her to outpatient hemodialysis center where they can remove more fluid from her lungs.  Afebrile.   Objective: Vitals:   11/25/20 0355 11/25/20 0358 11/25/20 0739 11/25/20 1111  BP: 106/63  133/70 139/61  Pulse: 65  72 73  Resp: 20  16 15   Temp: 97.7 F (36.5 C)  98 F (36.7 C) 98.1 F (36.7 C)  TempSrc: Oral  Oral Oral  SpO2: 97% 96% 94% 97%  Weight:        Intake/Output Summary (Last 24 hours) at 11/25/2020 1234 Last data filed at 11/25/2020 0700 Gross per 24 hour  Intake 2350 ml  Output --  Net 2350 ml   [employer]  11/22/20 2022 11/23/20 0610 11/24/20 0751  Weight: 113.9 kg 109 kg 112.3 kg    Examination:  General exam: Appears anxious. Eating Cheetos.  On 4 L oxygen.  Not in any obvious distress. Respiratory system: Clear to auscultation. Respiratory effort normal.  Mostly bilateral clear. Cardiovascular system: S1 & S2 heard, RRR.  Chronic venous stasis  changes and stasis ulcers both lower extremities. Gastrointestinal system: Soft.  Nontender.  Bowel sounds present. Central nervous system: Alert and oriented. No focal neurological deficits. Both upper arms with AV fistula.    Data Reviewed: I have personally reviewed following labs and imaging studies  CBC: Recent Labs  Lab 11/19/20 0242 11/21/20 0334 11/21/20 1224 11/22/20 0332 11/23/20 1807  WBC 8.6 6.0  --  5.5 5.7  NEUTROABS 6.8 4.5  --  4.1  --   HGB 7.0* 6.8* 7.4* 7.9* 8.9*  HCT 22.9* 21.5* 23.1* 25.5* 28.7*  MCV 100.4* 98.2  --  98.1 99.0  PLT 199 207  --  203 416   Basic Metabolic Panel: Recent Labs  Lab 11/19/20 0242 11/21/20 0334 11/22/20 0332 11/23/20 1807 11/24/20 0357  NA 136 136 137 134* 133*  K 4.4 4.4 3.9 4.0 4.4  CL 94* 98 98 95* 96*  CO2 28 26 27 27 25   GLUCOSE 99 121* 112* 229* 233*  BUN 44* 39* 18 21* 32*  CREATININE 7.69* 8.05* 5.09* 4.89* 5.45*  CALCIUM 8.3* 8.4* 8.9 8.8* 8.3*  MG 2.0  --   --  2.0  --   PHOS  --   --   --  2.8  --    GFR: Estimated Creatinine Clearance: 15.2 mL/min (A) (by C-G formula based on SCr of 5.45 mg/dL (H)). Liver Function Tests: No results for input(s): AST, ALT, ALKPHOS, BILITOT, PROT, ALBUMIN in the last 168 hours.  No results for input(s): LIPASE, AMYLASE in the last 168 hours. No results for input(s): AMMONIA in the last 168 hours. Coagulation Profile: No results for input(s): INR, PROTIME in the last 168 hours. Cardiac Enzymes: No results for input(s): CKTOTAL, CKMB, CKMBINDEX, TROPONINI in the last 168 hours. BNP (last 3 results) No results for input(s): PROBNP in the last 8760 hours. HbA1C: No results for input(s): HGBA1C in the last 72 hours. CBG: Recent Labs  Lab 11/24/20 1942 11/24/20 2327 11/25/20 0423 11/25/20 0738 11/25/20 1109  GLUCAP 321* 256* 259* 205* 263*   Lipid Profile: No results for input(s): CHOL, HDL, LDLCALC, TRIG, CHOLHDL, LDLDIRECT in the last 72 hours. Thyroid Function  Tests: No results for input(s): TSH, T4TOTAL, FREET4, T3FREE, THYROIDAB in the last 72 hours. Anemia Panel: No results for input(s): VITAMINB12, FOLATE, FERRITIN, TIBC, IRON, RETICCTPCT in the last 72 hours. Sepsis Labs: No results for input(s): PROCALCITON, LATICACIDVEN in the last 168 hours.   Recent Results (from the past 240 hour(s))  MRSA Next Gen by PCR, Nasal     Status: None   Collection Time: 11/17/20 10:28 PM   Specimen: Nasal Mucosa; Nasal Swab  Result Value Ref Range Status   MRSA by PCR Next Gen NOT DETECTED NOT DETECTED Final    Comment: (NOTE) The GeneXpert MRSA Assay (FDA approved for NASAL specimens only), is one component of a comprehensive MRSA colonization surveillance program. It is not intended to diagnose MRSA infection nor to guide or monitor treatment for MRSA infections. Test performance is not FDA approved in patients less than 28 years old. Performed at [facility], [place] [address] [place], [place] [address]  Culture, blood (routine x 2)     Status: None   Collection Time: 11/18/20 12:09 AM   Specimen: BLOOD RIGHT HAND  Result Value Ref Range Status   Specimen Description BLOOD RIGHT HAND  Final   Special Requests   Final    BOTTLES DRAWN AEROBIC ONLY Blood Culture adequate volume   Culture   Final    NO GROWTH 5 DAYS Performed at [facility], [address] [place], [place] [address]    Report Status 11/23/2020 FINAL  Final  Culture, blood (routine x 2)     Status: None   Collection Time: 11/18/20 12:19 AM   Specimen: BLOOD  Result Value Ref Range Status   Specimen Description BLOOD LEFT ANTECUBITAL  Final   Special Requests   Final    BOTTLES DRAWN AEROBIC AND ANAEROBIC Blood Culture adequate volume   Culture   Final    NO GROWTH 5 DAYS Performed at [place] [facility], [place] [address] [place], [place] [address]    Report Status 11/23/2020 FINAL  Final  Resp Panel by RT-PCR (Flu A&B, Covid) Nasopharyngeal Swab     Status:  None   Collection Time: 11/18/20  4:08 AM   Specimen: Nasopharyngeal Swab; Nasopharyngeal(NP) swabs in vial transport medium  Result Value Ref Range Status   SARS Coronavirus 2 by RT PCR NEGATIVE NEGATIVE Final    Comment: (NOTE) SARS-CoV-2 target nucleic acids are NOT DETECTED.  The SARS-CoV-2 RNA is generally detectable in upper respiratory specimens during the acute phase of infection. The lowest concentration of SARS-CoV-2 viral copies this assay can detect is 138 copies/mL. A negative result does not preclude SARS-Cov-2 infection and should not be used as the sole basis for treatment or other patient management decisions. A negative result may occur with  improper specimen collection/handling, submission of specimen other than nasopharyngeal swab, presence of viral mutation(s) within the areas targeted by this assay, and inadequate number of viral copies(<138 copies/mL). A negative result must be combined with clinical observations, patient history, and epidemiological information. The expected result is Negative.  Fact Sheet for Patients:  EntrepreneurPulse.com.au  Fact Sheet for Healthcare Providers:  IncredibleEmployment.be  This test is no t yet approved or cleared by the [place] FDA and  has been authorized for detection and/or diagnosis of SARS-CoV-2 by FDA under an Emergency Use Authorization (EUA). This EUA will remain  in effect (meaning this test can be used) for the duration of the COVID-19 declaration under Section 564(b)(1) of the Act, 21 U.S.C.section 360bbb-3(b)(1), unless the authorization is terminated  or revoked sooner.       Influenza A by PCR NEGATIVE NEGATIVE Final   Influenza B by PCR NEGATIVE NEGATIVE Final    Comment: (NOTE) The Xpert Xpress SARS-CoV-2/FLU/RSV plus assay is intended as an aid in the diagnosis of influenza from Nasopharyngeal swab specimens and should not be used as a sole basis for  treatment. Nasal washings and aspirates are unacceptable for Xpert Xpress SARS-CoV-2/FLU/RSV testing.  Fact Sheet for Patients: EntrepreneurPulse.com.au  Fact Sheet for Healthcare Providers: IncredibleEmployment.be  This test is not yet approved or cleared by the [place] FDA and has been authorized for detection and/or diagnosis of SARS-CoV-2 by FDA under an Emergency Use Authorization (EUA). This EUA will remain in effect (meaning this test can be used) for the duration of the COVID-19 declaration under Section 564(b)(1) of the Act, 21 U.S.C. section 360bbb-3(b)(1), unless the authorization is terminated or revoked.  Performed at [facility]  [facility], [place] [address] [place], [place] [address]   Culture, Respiratory w Gram Stain     Status: None   Collection Time: 11/18/20 10:14 AM   Specimen: Tracheal Aspirate; Respiratory  Result Value Ref Range Status   Specimen Description TRACHEAL ASPIRATE  Final   Special Requests NONE  Final   Gram Stain   Final    ABUNDANT WBC PRESENT,BOTH PMN AND MONONUCLEAR RARE GRAM POSITIVE COCCI RARE GRAM VARIABLE ROD    Culture   Final    RARE Normal respiratory flora-no Staph aureus or Pseudomonas seen Performed at [facility], [address] [place], [place] [address]    Report Status 11/20/2020 FINAL  Final         Radiology Studies: ECHOCARDIOGRAM COMPLETE  Result Date: 11/25/2020    ECHOCARDIOGRAM REPORT   Patient Name:   [name] Date of Exam: 11/25/2020 Medical Rec #:  [id number]          Height:       68.0 in Accession #:    [id number]         Weight:       247.6 lb Date of Birth:  April 03, 1965           BSA:          2.238 m Patient Age:    11 years           BP:           133/70 mmHg Patient Gender: F                  HR:           75 bpm. Exam Location:  Inpatient Procedure: 2D Echo, Cardiac Doppler and Color Doppler Indications:    CHF  History:        Patient has prior  history of Echocardiogram examinations, most                 recent 10/23/2018. Risk Factors:Former Smoker and Family History                 of Coronary Artery Disease.  Sonographer:    Helmut Muster Referring Phys: [id number] [place] Shi  Sonographer Comments: Echo performed with patient supine and on artificial respirator and patient is morbidly obese. IMPRESSIONS  1. Left ventricular ejection fraction, by estimation, is 60 to 65%. The left ventricle has normal function. The left ventricle has no regional wall motion abnormalities. There is moderate left ventricular hypertrophy. Left ventricular diastolic parameters are consistent with Grade II diastolic dysfunction (pseudonormalization). There is the interventricular septum is flattened in systole, consistent with right ventricular pressure overload.  2. Right ventricular systolic function is mildly reduced. The right ventricular size is mildly enlarged. There is moderately elevated pulmonary artery systolic pressure. The estimated right ventricular systolic pressure is 40.3 mmHg.  3. Left atrial size was severely dilated.  4. Right atrial size was severely dilated.  5. The mitral valve is grossly normal. Mild to moderate mitral valve regurgitation. No evidence of mitral stenosis.  6. The aortic valve is tricuspid. There is mild calcification of the aortic valve. Aortic valve regurgitation is not visualized. Mild aortic valve sclerosis is present, with no evidence of aortic valve stenosis.  7. The inferior vena cava is dilated in size with <50% respiratory variability, suggesting right atrial pressure of 15 mmHg. Comparison(s): No significant change from prior study. FINDINGS  Left Ventricle: Left ventricular ejection fraction, by  estimation, is 60 to 65%. The left ventricle has normal function. The left ventricle has no regional wall motion abnormalities. The left ventricular internal cavity size was normal in size. There is  moderate left ventricular  hypertrophy. The interventricular septum is flattened in systole, consistent with right ventricular pressure overload. Left ventricular diastolic parameters are consistent with Grade II diastolic dysfunction (pseudonormalization). Right Ventricle: The right ventricular size is mildly enlarged. No increase in right ventricular wall thickness. Right ventricular systolic function is mildly reduced. There is moderately elevated pulmonary artery systolic pressure. The tricuspid regurgitant velocity is 2.80 m/s, and with an assumed right atrial pressure of 15 mmHg, the estimated right ventricular systolic pressure is 63.1 mmHg. Left Atrium: Left atrial size was severely dilated. Right Atrium: Right atrial size was severely dilated. Pericardium: Trivial pericardial effusion is present. Mitral Valve: The mitral valve is grossly normal. Mild to moderate mitral valve regurgitation. No evidence of mitral valve stenosis. Tricuspid Valve: The tricuspid valve is grossly normal. Tricuspid valve regurgitation is mild . No evidence of tricuspid stenosis. Aortic Valve: The aortic valve is tricuspid. There is mild calcification of the aortic valve. Aortic valve regurgitation is not visualized. Mild aortic valve sclerosis is present, with no evidence of aortic valve stenosis. Pulmonic Valve: The pulmonic valve was grossly normal. Pulmonic valve regurgitation is trivial. No evidence of pulmonic stenosis. Aorta: The aortic root and ascending aorta are structurally normal, with no evidence of dilitation. Venous: The inferior vena cava is dilated in size with less than 50% respiratory variability, suggesting right atrial pressure of 15 mmHg. IAS/Shunts: The atrial septum is grossly normal.  LEFT VENTRICLE PLAX 2D LVIDd:         5.00 cm      Diastology LVIDs:         3.40 cm      LV e' medial:    9.03 cm/s LV PW:         1.50 cm      LV E/e' medial:  9.7 LV IVS:        1.50 cm      LV e' lateral:   9.90 cm/s LVOT diam:     2.00 cm      LV  E/e' lateral: 8.9 LV SV:         69 LV SV Index:   31 LVOT Area:     3.14 cm  LV Volumes (MOD) LV vol d, MOD A2C: 146.0 ml LV vol d, MOD A4C: 124.0 ml LV vol s, MOD A2C: 53.9 ml LV vol s, MOD A4C: 47.5 ml LV SV MOD A2C:     92.1 ml LV SV MOD A4C:     124.0 ml LV SV MOD BP:      83.4 ml RIGHT VENTRICLE            IVC RV S prime:     9.36 cm/s  IVC diam: 3.30 cm TAPSE (M-mode): 2.1 cm LEFT ATRIUM              Index        RIGHT ATRIUM           Index LA diam:        4.20 cm  1.88 cm/m   RA Area:     16.00 cm LA Vol (A2C):   127.0 ml 56.75 ml/m  RA Volume:   37.00 ml  16.53 ml/m LA Vol (A4C):   109.0 ml 48.71 ml/m LA Biplane Vol: 117.0 ml 52.28  ml/m  AORTIC VALVE LVOT Vmax:   120.00 cm/s LVOT Vmean:  63.500 cm/s LVOT VTI:    0.220 m  AORTA Ao Root diam: 3.40 cm Ao Asc diam:  3.62 cm MITRAL VALVE               TRICUSPID VALVE MV Area (PHT): 3.31 cm    TR Peak grad:   31.4 mmHg MV Decel Time: 230 msec    TR Vmax:        280.00 cm/s MV E velocity: 87.63 cm/s MV A velocity: 64.60 cm/s  SHUNTS MV E/A ratio:  1.36        Systemic VTI:  0.22 m                            Systemic Diam: 2.00 cm Eleonore Chiquito MD Electronically signed by Eleonore Chiquito MD Signature Date/Time: 11/25/2020/12:07:18 PM    Final         Scheduled Meds:  sodium chloride   Intravenous Once   Chlorhexidine Gluconate Cloth  6 each Topical Daily   Chlorhexidine Gluconate Cloth  6 each Topical Q0600   darbepoetin (ARANESP) injection - DIALYSIS  60 mcg Intravenous Q Wed-HD   ferric citrate  420 mg Oral TID WC   guaiFENesin  600 mg Oral BID   heparin  5,000 Units Subcutaneous Q8H   hydrOXYzine  25 mg Oral Daily   insulin aspart  0-5 Units Subcutaneous QHS   insulin aspart  0-6 Units Subcutaneous TID WC   insulin glargine-yfgn  14 Units Subcutaneous QHS   metoprolol succinate  100 mg Oral Daily   multivitamin  1 tablet Oral QHS   mupirocin ointment  1 application Topical BID   pramipexole  0.5 mg Oral Daily   rosuvastatin  40 mg  Oral Daily   Continuous Infusions:  sodium chloride Stopped (11/19/20 1239)     LOS: 8 days    Time spent: 34 minutes    Barb Merino, MD [facility] Pager [phone]

## 2020-11-25 NOTE — Plan of Care (Signed)

## 2020-11-25 NOTE — Progress Notes (Signed)
Pt placed on bipap 12/6. Sat 92%, Rt will cont to monitor.

## 2020-11-25 NOTE — Progress Notes (Signed)
Inpatient Diabetes Program Recommendations  AACE/ADA: New Consensus Statement on Inpatient Glycemic Control (2015)  Target Ranges:  Prepandial:   less than 140 mg/dL      Peak postprandial:   less than 180 mg/dL (1-2 hours)      Critically ill patients:  140 - 180 mg/dL   Lab Results  Component Value Date   GLUCAP 263 (H) 11/25/2020   HGBA1C 9.7 (A) 05/01/2020    Review of Glycemic Control Results for ARRAYAH, CONNORS (MRN [id number]) as of 11/25/2020 12:12  Ref. Range 11/24/2020 23:27 11/25/2020 04:23 11/25/2020 07:38 11/25/2020 11:09  Glucose-Capillary Latest Ref Range: 70 - 99 mg/dL 256 (H) 259 (H) 205 (H) 263 (H)   Diabetes history: Type 2 DM Outpatient Diabetes medications: Humalog 75/25 35 units BID Current orders for Inpatient glycemic control: Novolog 0-6 units TID, Semglee 14 QHS   Inpatient Diabetes Program Recommendations:     Consider increasing Semglee to 20 units QHS.   Thanks, Bronson Curb, MSN, RNC-OB Diabetes Coordinator [phone] (8a-5p)

## 2020-11-25 NOTE — Progress Notes (Signed)
[facility] NEPHROLOGY PROGRESS NOTE  Assessment/ Plan:  OP HD: [facility] MWF  4h  450/800   111.5kg  2/2 bath  15ga  RUA AVF Hep 10,000  - calc 1.0 ug tiw   - parsabiv 5 mg IV tiw  #Acute hypoxic respiratory failure, suspected pneumonia.  Treated with Doxy and ceftriaxone.  Extubated.  She required BiPAP this morning.  She probably has sleep apnea.  # ESRD, on MWF schedule.  Status post fistulogram by IR on 10/21 with patent fistula.  Status post dialysis yesterday with around 2 L ultrafiltration, tolerated well.  Next HD tomorrow.  #Shock/hypotension: Resolved  # Anemia of CKD: Received PRBC, started ESA.  Monitor hemoglobin.  # Secondary hyperparathyroidism: Lower Auryxia dose as phosphorus is on the low side.  Monitor electrolytes.  # HTN/volume: Blood pressure acceptable, UF during HD.  Subjective: Seen and examined dialysis unit.  Patient required BiPAP this morning.  She feels better on BiPAP.  Denies nausea vomiting.  Her nurse at bedside. Objective Vital signs in last 24 hours: Vitals:   11/24/20 2325 11/25/20 0355 11/25/20 0358 11/25/20 0739  BP: 125/63 106/63  133/70  Pulse: 77 65  72  Resp: 16 20  16   Temp: 98.3 F (36.8 C) 97.7 F (36.5 C)  98 F (36.7 C)  TempSrc: Axillary Oral  Oral  SpO2: 100% 97% 96% 94%  Weight:       Weight change:   Intake/Output Summary (Last 24 hours) at 11/25/2020 0930 Last data filed at 11/25/2020 0700 Gross per 24 hour  Intake 2350 ml  Output 2076 ml  Net 274 ml        Labs: Basic Metabolic Panel: Recent Labs  Lab 11/22/20 0332 11/23/20 1807 11/24/20 0357  NA 137 134* 133*  K 3.9 4.0 4.4  CL 98 95* 96*  CO2 27 27 25   GLUCOSE 112* 229* 233*  BUN 18 21* 32*  CREATININE 5.09* 4.89* 5.45*  CALCIUM 8.9 8.8* 8.3*  PHOS  --  2.8  --     Liver Function Tests: No results for input(s): AST, ALT, ALKPHOS, BILITOT, PROT, ALBUMIN in the last 168 hours.  No results for input(s): LIPASE, AMYLASE in the last  168 hours. No results for input(s): AMMONIA in the last 168 hours. CBC: Recent Labs  Lab 11/19/20 0242 11/21/20 0334 11/21/20 1224 11/22/20 0332 11/23/20 1807  WBC 8.6 6.0  --  5.5 5.7  NEUTROABS 6.8 4.5  --  4.1  --   HGB 7.0* 6.8* 7.4* 7.9* 8.9*  HCT 22.9* 21.5* 23.1* 25.5* 28.7*  MCV 100.4* 98.2  --  98.1 99.0  PLT 199 207  --  203 214    Cardiac Enzymes: No results for input(s): CKTOTAL, CKMB, CKMBINDEX, TROPONINI in the last 168 hours. CBG: Recent Labs  Lab 11/24/20 1511 11/24/20 1942 11/24/20 2327 11/25/20 0423 11/25/20 0738  GLUCAP 270* 321* 256* 259* 205*     Iron Studies: No results for input(s): IRON, TIBC, TRANSFERRIN, FERRITIN in the last 72 hours. Studies/Results: No results found.  Medications: Infusions:  sodium chloride Stopped (11/19/20 1239)    Scheduled Medications:  sodium chloride   Intravenous Once   Chlorhexidine Gluconate Cloth  6 each Topical Daily   darbepoetin (ARANESP) injection - DIALYSIS  60 mcg Intravenous Q Wed-HD   ferric citrate  420 mg Oral TID WC   guaiFENesin  600 mg Oral BID   heparin  5,000 Units Subcutaneous Q8H   hydrOXYzine  25 mg Oral  Daily   insulin aspart  0-5 Units Subcutaneous QHS   insulin aspart  0-6 Units Subcutaneous TID WC   insulin glargine-yfgn  14 Units Subcutaneous QHS   metoprolol succinate  100 mg Oral Daily   multivitamin  1 tablet Oral QHS   mupirocin ointment  1 application Topical BID   pramipexole  0.5 mg Oral Daily   rosuvastatin  40 mg Oral Daily    have reviewed scheduled and prn medications.  Physical Exam: General: Sitting on chair, using BiPAP Heart:RRR, s1s2 nl Lungs: Basal rhonchi, no increased work of breathing.  No wheezing Abdomen:soft, Non-tender, non-distended Extremities:No edema Dialysis Access: Right upper extremity AV fistula has good thrill and bruit  [name] [name] 11/25/2020,9:30 AM  LOS: 8 days

## 2020-11-25 NOTE — Evaluation (Signed)
Occupational Therapy Evaluation Patient Details Name: [name] MRN: [id number] DOB: 1965/11/18 Today's Date: 11/25/2020   History of Present Illness 55 yo female presents to [facility] on 10/17 from HD with R knee pain since fall on 10/14. Worsening mental status and apnea in ED possibly due to narcotic overdose narcan administered, ETT 10/17-10/18, requiring bipap at night for retained CO2. CXR shows bilat infiltrates suspect PNA. AVF issues with early termination of HD on 10/19. Fall OOB 10/20.  PMH includes anxiety, RA, CAD, HF, ESRD on HD MWF, HTN, RLS, sleep apnea, thoracic ascending aortic aneurysm, DM II with retinopathy, obesity, peripheral neuropathy,  tobacco abuse, L thumb amputation.   Clinical Impression   PTA pt lives at home with her husband and son. After her L thumb amputation, pt has had difficulty with ADL and IADL tasks. Pt able to mobilize with minguard A @ RW level with VSS on 3L. Mod A with ADL tasks. Poor pleth, however when O2 sensor placed on ear lobe, SpO2 100% on 3L. Will follow acutely to maximize functional level of independence to facilitate safe DC home with HHOT follow up.       Recommendations for follow up therapy are one component of a multi-disciplinary discharge planning process, led by the attending physician.  Recommendations may be updated based on patient status, additional functional criteria and insurance authorization.   Follow Up Recommendations  Home health OT    Assistance Recommended at Discharge Intermittent Supervision/Assistance  Functional Status Assessment  Patient has had a recent decline in their functional status and demonstrates the ability to make significant improvements in function in a reasonable and predictable amount of time.  Equipment Recommendations       Recommendations for Other Services       Precautions / Restrictions Precautions Precautions: Fall;Other (comment) Precaution Comments: watch 02      Mobility Bed  Mobility Overal bed mobility: Modified Independent                  Transfers Overall transfer level: Needs assistance Equipment used: Rolling walker (2 wheels) Transfers: Sit to/from Stand                    Balance Overall balance assessment: Needs assistance;History of Falls Sitting-balance support: Feet supported;No upper extremity supported Sitting balance-Leahy Scale: Good    Standing balance support: During functional activity Standing balance-Leahy Scale: Poor Standing balance comment: reliant on external support and UE support;                        ADL either performed or assessed with clinical judgement   ADL Overall ADL's : Needs assistance/impaired Eating/Feeding: Set up   Grooming: Moderate assistance   Upper Body Bathing: Minimal assistance   Lower Body Bathing: Moderate assistance   Upper Body Dressing : Moderate assistance   Lower Body Dressing: Moderate assistance   Toilet Transfer: Min guard;Ambulation;Rolling walker (2 wheels)   Toileting- Clothing Manipulation and Hygiene: Moderate assistance       Functional mobility during ADLs: Min guard;Rolling walker (2 wheels) General ADL Comments: diffiiculty grasping items     Vision Baseline Vision/History: 1 Wears glasses Vision Assessment?: Vision impaired- to be further tested in functional context Additional Comments: difficulty with vision however functional; hx of retinopathy     Perception     Praxis      Pertinent Vitals/Pain Pain Assessment: No/denies pain     Hand Dominance Right   Extremity/Trunk  Assessment Upper Extremity Assessment Upper Extremity Assessment: Generalized weakness;LUE deficits/detail (s/p thumb amputation)   Lower Extremity Assessment Lower Extremity Assessment: Defer to PT evaluation   Cervical / Trunk Assessment Cervical / Trunk Assessment: Normal   Communication Communication Communication: No difficulties   Cognition  Arousal/Alertness: Awake/alert Behavior During Therapy: WFL for tasks assessed/performed Overall Cognitive Status: No family/caregiver present to determine baseline cognitive functioning Area of Impairment: Attention;Memory;Safety/judgement;Problem solving;Awareness                   Current Attention Level: Selective Memory: Decreased short-term memory   Safety/Judgement: Decreased awareness of deficits;Decreased awareness of safety Awareness: Emergent Problem Solving: Slow processing General Comments: Seems WFL for basic mobility tasks. Aware that she does not recall events getting to hospital. " I was out of it".      General Comments  multiple open wounds BLE    Exercises     Shoulder Instructions      Home Living Family/patient expects to be discharged to:: Private residence Living Arrangements: Children;Spouse/significant other Available Help at Discharge: Available 24 hours/day;Family Type of Home: House Home Access: Stairs to enter [employer] of Steps: 1   Home Layout: One level     Bathroom Shower/Tub: Occupational psychologist: Handicapped height Bathroom Accessibility: Yes How Accessible: Accessible via walker (not rollator) Home Equipment: [place] (2 wheels);Rollator (4 wheels);Shower seat;Grab bars - tub/shower;Wheelchair - manual;Hand held shower head   Additional Comments: Reports her rollator was her mothers and is very worn out.  She lives with her son and spouse who assist as needed.  Pt reports she has access to a w/c that is her sister in laws      Prior Functioning/Environment Prior Level of Function : Needs assist;History of Falls (last six months)       Physical Assist : ADLs (physical)   ADLs (physical): Bathing;Dressing;IADLs   ADLs Comments: Family has assisted since thumb amputation        OT Problem List: Decreased strength;Decreased range of motion;Decreased activity tolerance;Impaired balance  (sitting and/or standing);Decreased coordination;Decreased cognition;Decreased safety awareness;Decreased knowledge of use of DME or AE;Decreased knowledge of precautions;Cardiopulmonary status limiting activity;Obesity;Impaired sensation;Impaired UE functional use      OT Treatment/Interventions: Self-care/ADL training;Therapeutic exercise;Energy conservation;DME and/or AE instruction;Splinting;Therapeutic activities;Patient/family education;Balance training    OT Goals(Current goals can be found in the care plan section) Acute Rehab OT Goals Patient Stated Goal: to be more independent OT Goal Formulation: With patient Time For Goal Achievement: 12/09/20 Potential to Achieve Goals: Good  OT Frequency: Min 2X/week   Barriers to D/C:            Co-evaluation              AM-PAC OT "6 Clicks" Daily Activity     Outcome Measure Help from another person eating meals?: A Little Help from another person taking care of personal grooming?: A Little Help from another person toileting, which includes using toliet, bedpan, or urinal?: A Little Help from another person bathing (including washing, rinsing, drying)?: A Lot Help from another person to put on and taking off regular upper body clothing?: A Lot Help from another person to put on and taking off regular lower body clothing?: A Lot 6 Click Score: 15   End of Session Equipment Utilized During Treatment: Rolling walker (2 wheels);Oxygen (3L) Nurse Communication: Mobility status  Activity Tolerance: Patient tolerated treatment well Patient left: in bed;with call bell/phone within reach;with bed alarm set  OT Visit  Diagnosis: Unsteadiness on feet (R26.81);Other abnormalities of gait and mobility (R26.89);Muscle weakness (generalized) (M62.81);History of falling (Z91.81);Other symptoms and signs involving cognitive function                Time: 1405-1431 OT Time Calculation (min): 26 min Charges:  OT General Charges $OT Visit: 1  Visit OT Evaluation $OT Eval Moderate Complexity: 1 Mod OT Treatments $Self Care/Home Management : 8-22 mins  Maurie Boettcher, OT/L   Acute OT Clinical Specialist Acute Rehabilitation Services Pager [phone] Office [phone]   [facility] 11/25/2020, 3:20 PM

## 2020-11-26 DIAGNOSIS — G9340 Encephalopathy, unspecified: Secondary | ICD-10-CM | POA: Diagnosis not present

## 2020-11-26 LAB — GLUCOSE, CAPILLARY
Glucose-Capillary: 320 mg/dL — ABNORMAL HIGH (ref 70–99)
Glucose-Capillary: 350 mg/dL — ABNORMAL HIGH (ref 70–99)
Glucose-Capillary: 319 mg/dL — ABNORMAL HIGH (ref 70–99)
Glucose-Capillary: 259 mg/dL — ABNORMAL HIGH (ref 70–99)
Glucose-Capillary: 298 mg/dL — ABNORMAL HIGH (ref 70–99)

## 2020-11-26 LAB — EXPECTORATED SPUTUM ASSESSMENT W GRAM STAIN, RFLX TO RESP C

## 2020-11-26 MED ORDER — INSULIN GLARGINE-YFGN 100 UNIT/ML ~~LOC~~ SOLN
14.0000 [IU] | Freq: Two times a day (BID) | SUBCUTANEOUS | Status: DC
  Administered 2020-11-26 – 2020-11-27 (×2): 14 [IU] via SUBCUTANEOUS
  Filled 2020-11-26 (×3): qty 0.14

## 2020-11-26 NOTE — Progress Notes (Signed)
Inpatient Diabetes Program Recommendations  AACE/ADA: New Consensus Statement on Inpatient Glycemic Control (2015)  Target Ranges:  Prepandial:   less than 140 mg/dL      Peak postprandial:   less than 180 mg/dL (1-2 hours)      Critically ill patients:  140 - 180 mg/dL   Lab Results  Component Value Date   GLUCAP 350 (H) 11/26/2020   HGBA1C 9.7 (A) 05/01/2020    Diabetes history: Type 2 DM Outpatient Diabetes medications: Humalog 75/25 35 units BID Current orders for Inpatient glycemic control: Novolog 0-6 units TID, Semglee 14 QHS   Inpatient Diabetes Program Recommendations:     Consider increasing Semglee to 14 units BID.    Thanks, Bronson Curb, MSN, RNC-OB Diabetes Coordinator [phone] (8a-5p)

## 2020-11-26 NOTE — Progress Notes (Signed)
PROGRESS NOTE    LEONELA KIVI  NOM:[id number] DOB: Dec 14, 1965 DOA: 11/17/2020 PCP: Sandi Mariscal, MD    Brief Narrative:  ESRD on hemodialysis, hypertension hyperlipidemia, type 2 diabetes on insulin, rheumatoid arthritis on Humira, anemia of chronic disease, obstructive sleep apnea with inconclusive sleep study presented to the hospital with fall and right knee pain.  She was given a dose of Dilaudid in the ER for knee pain following which she developed a period of apnea, hypertension and hypoxia where she was intubated for airway protection and admitted to ICU.  10/18-extubated to BiPAP, hemodialysis.  Transferred to [facility] service on 10/20.  Treated for aspiration pneumonia with Rocephin and doxycycline.  Mental status improving but she is requiring frequent BiPAP and oxygen.   Assessment & Plan:   Active Problems:   Encephalopathy acute  Acute hypoxemic respiratory failure: Suspect secondary to pneumonia versus pulmonary edema.  Also with underlying sleep apnea.  Intubated on mechanical ventilation, extubated on day 2. Currently remains on nasal cannula oxygen to keep saturation more than 90%, using BiPAP at night.  Subsequent hemodialysis.  Respiratory therapy.  Mobility.  Incentive spirometry.  Completed antibiotic course. -Discharging home with oxygen. -Clinical evidence of sleep apnea, incomplete sleep apnea studies last month, pulmonary will reschedule follow-up.  With current incompletely study and normal blood gas, she does not qualify for noninvasive ventilator.  Will discharge home with oxygen and refer back to pulmonary for follow-up.   ESRD on hemodialysis: Getting dialysis on schedule.  Good response to extra sessions of dialysis yesterday.  Fall and right knee pain: Soft tissue injury.  Avoid opiates.  Type 2 diabetes on insulin: Appetite improving.  Blood sugars improving.  Increased dose of long-acting insulin today.  Delirium and acute metabolic encephalopathy: Due to  ICU delirium.  Improved.  Rheumatoid arthritis: On chronic Humira .  She will resume Humira as outpatient.  Abnormal CT scan of the chest: A CTA done 10/25 negative for PE.  Negative for pneumonia. It shows multiple scattered nodules about 7 mm in size.  Also has history of rheumatoid arthritis. Apparently patient has history of renal tumor and abnormal PET scan last year.  Lung nodules were not present at that time. Called and discussed case with Dr. Irene Limbo from oncology who referred to urology as patient was following up and was on observation treatment. Will try to connect to Dr. Tresa Moore who has been following her up for renal tumor.  Could not see any positive scans. Lung nodules are not amenable to bronchoscopy and she is high risk for percutaneous biopsy.  If she has concerning renal lesion, may benefit with a biopsy.   DVT prophylaxis: heparin injection 5,000 Units Start: 11/17/20 2230 SCDs Start: 11/17/20 2225   Code Status: Full code Family Communication: Sister on the phone. Disposition Plan: Status is: Inpatient  Remains inpatient appropriate because: Still on significant oxygen.  Hemodialysis needed.  Still using BiPAP at night.    Consultants:  Nephrology Critical care  Procedures:  None  Antimicrobials:  Completed   Subjective: Seen and examined.  Today on 3 L oxygen.  Denies any shortness of breath.  She was very happy with a lot of fluid removed last night.  Still used CPAP last night because it helps her to sleep.  Denies any pain or shortness of breath or chest pain.   Objective: Vitals:   11/26/20 0235 11/26/20 0433 11/26/20 0728 11/26/20 1125  BP: 131/75 121/66 (!) 142/63 139/66  Pulse: 73 74 72 73  Resp: (!) 22 20 17 20   Temp:  98 F (36.7 C) 98.2 F (36.8 C) 98.1 F (36.7 C)  TempSrc:  Oral Oral Oral  SpO2: 100% 90% 97% 94%  Weight:        Intake/Output Summary (Last 24 hours) at 11/26/2020 1245 Last data filed at 11/26/2020 0900 Gross per 24  hour  Intake 500 ml  Output 3335 ml  Net -2835 ml   Filed Weights   11/24/20 0751 11/25/20 1611 11/25/20 2009  Weight: 112.3 kg 115.1 kg 110.6 kg    Examination:  General exam: Appears fairly comfortable today.  On 3 L oxygen sitting in the chair. Respiratory system: Clear to auscultation. Respiratory effort normal.  Mostly bilateral clear. Cardiovascular system: S1 & S2 heard, RRR.  Chronic venous stasis changes and stasis ulcers both lower extremities. Gastrointestinal system: Soft.  Nontender.  Bowel sounds present. Central nervous system: Alert and oriented. No focal neurological deficits. Both upper arms with AV fistula.    Data Reviewed: I have personally reviewed following labs and imaging studies  CBC: Recent Labs  Lab 11/21/20 0334 11/21/20 1224 11/22/20 0332 11/23/20 1807 11/25/20 1254 11/25/20 1600  WBC 6.0  --  5.5 5.7  --  6.2  NEUTROABS 4.5  --  4.1  --   --   --   HGB 6.8* 7.4* 7.9* 8.9* 9.9* 8.3*  HCT 21.5* 23.1* 25.5* 28.7* 29.0* 27.2*  MCV 98.2  --  98.1 99.0  --  100.4*  PLT 207  --  203 214  --  245   Basic Metabolic Panel: Recent Labs  Lab 11/21/20 0334 11/22/20 0332 11/23/20 1807 11/24/20 0357 11/25/20 1254 11/25/20 1600  NA 136 137 134* 133* 132* 130*  K 4.4 3.9 4.0 4.4 4.1 4.2  CL 98 98 95* 96*  --  92*  CO2 26 27 27 25   --  28  GLUCOSE 121* 112* 229* 233*  --  349*  BUN 39* 18 21* 32*  --  34*  CREATININE 8.05* 5.09* 4.89* 5.45*  --  4.92*  CALCIUM 8.4* 8.9 8.8* 8.3*  --  8.5*  MG  --   --  2.0  --   --   --   PHOS  --   --  2.8  --   --  3.1   GFR: Estimated Creatinine Clearance: 16.6 mL/min (A) (by C-G formula based on SCr of 4.92 mg/dL (H)). Liver Function Tests: Recent Labs  Lab 11/25/20 1600  ALBUMIN 2.7*    No results for input(s): LIPASE, AMYLASE in the last 168 hours. No results for input(s): AMMONIA in the last 168 hours. Coagulation Profile: No results for input(s): INR, PROTIME in the last 168 hours. Cardiac  Enzymes: No results for input(s): CKTOTAL, CKMB, CKMBINDEX, TROPONINI in the last 168 hours. BNP (last 3 results) No results for input(s): PROBNP in the last 8760 hours. HbA1C: No results for input(s): HGBA1C in the last 72 hours. CBG: Recent Labs  Lab 11/25/20 1529 11/25/20 2038 11/26/20 0437 11/26/20 0726 11/26/20 1121  GLUCAP 285* 258* 320* 350* 319*   Lipid Profile: No results for input(s): CHOL, HDL, LDLCALC, TRIG, CHOLHDL, LDLDIRECT in the last 72 hours. Thyroid Function Tests: No results for input(s): TSH, T4TOTAL, FREET4, T3FREE, THYROIDAB in the last 72 hours. Anemia Panel: No results for input(s): VITAMINB12, FOLATE, FERRITIN, TIBC, IRON, RETICCTPCT in the last 72 hours. Sepsis Labs: No results for input(s): PROCALCITON, LATICACIDVEN in the last 168 hours.   Recent Results (  from the past 240 hour(s))  MRSA Next Gen by PCR, Nasal     Status: None   Collection Time: 11/17/20 10:28 PM   Specimen: Nasal Mucosa; Nasal Swab  Result Value Ref Range Status   MRSA by PCR Next Gen NOT DETECTED NOT DETECTED Final    Comment: (NOTE) The GeneXpert MRSA Assay (FDA approved for NASAL specimens only), is one component of a comprehensive MRSA colonization surveillance program. It is not intended to diagnose MRSA infection nor to guide or monitor treatment for MRSA infections. Test performance is not FDA approved in patients less than 43 years old. Performed at [facility], [place] [address] [place], [place] [address]   Culture, blood (routine x 2)     Status: None   Collection Time: 11/18/20 12:09 AM   Specimen: BLOOD RIGHT HAND  Result Value Ref Range Status   Specimen Description BLOOD RIGHT HAND  Final   Special Requests   Final    BOTTLES DRAWN AEROBIC ONLY Blood Culture adequate volume   Culture   Final    NO GROWTH 5 DAYS Performed at [facility], [address] [place], [place] [address]    Report Status 11/23/2020 FINAL  Final  Culture, blood  (routine x 2)     Status: None   Collection Time: 11/18/20 12:19 AM   Specimen: BLOOD  Result Value Ref Range Status   Specimen Description BLOOD LEFT ANTECUBITAL  Final   Special Requests   Final    BOTTLES DRAWN AEROBIC AND ANAEROBIC Blood Culture adequate volume   Culture   Final    NO GROWTH 5 DAYS Performed at [facility], [place] [address], [place], [place] [address]    Report Status 11/23/2020 FINAL  Final  Resp Panel by RT-PCR (Flu A&B, Covid) Nasopharyngeal Swab     Status: None   Collection Time: 11/18/20  4:08 AM   Specimen: Nasopharyngeal Swab; Nasopharyngeal(NP) swabs in vial transport medium  Result Value Ref Range Status   SARS Coronavirus 2 by RT PCR NEGATIVE NEGATIVE Final    Comment: (NOTE) SARS-CoV-2 target nucleic acids are NOT DETECTED.  The SARS-CoV-2 RNA is generally detectable in upper respiratory specimens during the acute phase of infection. The lowest concentration of SARS-CoV-2 viral copies this assay can detect is 138 copies/mL. A negative result does not preclude SARS-Cov-2 infection and should not be used as the sole basis for treatment or other patient management decisions. A negative result may occur with  improper specimen collection/handling, submission of specimen other than nasopharyngeal swab, presence of viral mutation(s) within the areas targeted by this assay, and inadequate number of viral copies(<138 copies/mL). A negative result must be combined with clinical observations, patient history, and epidemiological information. The expected result is Negative.  Fact Sheet for Patients:  EntrepreneurPulse.com.au  Fact Sheet for Healthcare Providers:  IncredibleEmployment.be  This test is no t yet approved or cleared by the [place] FDA and  has been authorized for detection and/or diagnosis of SARS-CoV-2 by FDA under an Emergency Use Authorization (EUA). This EUA will remain  in effect (meaning  this test can be used) for the duration of the COVID-19 declaration under Section 564(b)(1) of the Act, 21 U.S.C.section 360bbb-3(b)(1), unless the authorization is terminated  or revoked sooner.       Influenza A by PCR NEGATIVE NEGATIVE Final   Influenza B by PCR NEGATIVE NEGATIVE Final    Comment: (NOTE) The Xpert Xpress SARS-CoV-2/FLU/RSV plus assay is  intended as an aid in the diagnosis of influenza from Nasopharyngeal swab specimens and should not be used as a sole basis for treatment. Nasal washings and aspirates are unacceptable for Xpert Xpress SARS-CoV-2/FLU/RSV testing.  Fact Sheet for Patients: EntrepreneurPulse.com.au  Fact Sheet for Healthcare Providers: IncredibleEmployment.be  This test is not yet approved or cleared by the [place] FDA and has been authorized for detection and/or diagnosis of SARS-CoV-2 by FDA under an Emergency Use Authorization (EUA). This EUA will remain in effect (meaning this test can be used) for the duration of the COVID-19 declaration under Section 564(b)(1) of the Act, 21 U.S.C. section 360bbb-3(b)(1), unless the authorization is terminated or revoked.  Performed at [facility], [place] [address] [place], [place] [address]   Culture, Respiratory w Gram Stain     Status: None   Collection Time: 11/18/20 10:14 AM   Specimen: Tracheal Aspirate; Respiratory  Result Value Ref Range Status   Specimen Description TRACHEAL ASPIRATE  Final   Special Requests NONE  Final   Gram Stain   Final    ABUNDANT WBC PRESENT,BOTH PMN AND MONONUCLEAR RARE GRAM POSITIVE COCCI RARE GRAM VARIABLE ROD    Culture   Final    RARE Normal respiratory flora-no Staph aureus or Pseudomonas seen Performed at [facility], [address], [place], [place] [address]    Report Status 11/20/2020 FINAL  Final  Expectorated Sputum Assessment w Gram Stain, Rflx to Resp Cult     Status: None   Collection Time:  11/26/20 10:07 AM   Specimen: Expectorated Sputum  Result Value Ref Range Status   Specimen Description EXPECTORATED SPUTUM  Final   Special Requests Immunocompromised  Final   Sputum evaluation   Final    Sputum specimen not acceptable for testing.  Please recollect.   RESULT CALLED TO, READ BACK BY AND VERIFIED WITH: RN Toni Amend [phone] FCP Performed at [facility], [place] [address] [place], [place] [address]    Report Status 11/26/2020 FINAL  Final         Radiology Studies: CT Angio Chest Pulmonary Embolism (PE) W or WO Contrast  Result Date: 11/25/2020 CLINICAL DATA:  Pulmonary embolism suspected, high probability. Decreased mobility. Persistent shortness of breath with hypoxia. Patient is reported to be immunocompromised. EXAM: CT ANGIOGRAPHY CHEST WITH CONTRAST TECHNIQUE: Multidetector CT imaging of the chest was performed using the standard protocol during bolus administration of intravenous contrast. Multiplanar CT image reconstructions and MIPs were obtained to evaluate the vascular anatomy. CONTRAST:  28mL OMNIPAQUE IOHEXOL 350 MG/ML SOLN COMPARISON:  Radiographs 11/25/2020 and 11/22/2020. CTA 02/19/2020. PET-CT 08/15/2019. FINDINGS: Cardiovascular: The pulmonary arteries are well opacified with contrast to the level of the subsegmental branches. There is no evidence of acute pulmonary embolism. There is central enlargement of the pulmonary arteries suspicious for pulmonary arterial hypertension. Atherosclerosis of the aorta, great vessels and coronary arteries noted. There is no aneurysm or dissection. The heart is moderately enlarged. No significant pericardial fluid. Mediastinum/Nodes: Stable mildly prominent mediastinal and axillary lymph nodes, including a 1.8 cm right paratracheal node on image 45/5. Based on stability, these nodes are likely reactive. The thyroid gland, trachea and esophagus appear stable, without significant findings. Lungs/Pleura: No significant  pleural effusion or pneumothorax. Pulmonary evaluation mildly limited by breathing artifact. Mosaic attenuation again noted with patchy ground-glass opacities in both lungs. There are multiple new solid and sub solid pulmonary nodules in both lungs. Representative nodules include a 7 mm right  upper lobe nodule on image 49/6, a 7 mm left lower lobe nodule on image 55/6 and a 7 mm right upper lobe nodule on image 56/6. Upper abdomen: No acute findings are seen within the visualized upper abdomen. The visualized spleen appears enlarged. The kidneys are not imaged in this patient who has a history of a mass involving the upper pole of the right kidney. Musculoskeletal/Chest wall: Diffuse osteosclerosis noted. No focal lytic lesion or acute osseous abnormality identified. No evidence of chest wall mass. Review of the MIP images confirms the above findings. IMPRESSION: 1. No evidence of acute pulmonary embolism or other acute vascular findings. 2. Cardiomegaly with central enlargement of the pulmonary arteries consistent with pulmonary arterial hypertension. 3. New bilateral pulmonary nodules, suspicious for metastatic disease in this patient with a history of urologic cancer per previous PET-CT. Prior studies have also demonstrated a mass in the upper pole of the right kidney suspicious for renal cell carcinoma. 4. Underlying chronic mosaic attenuation of the lungs which may reflect edema or hypersensitivity pneumonitis. 5. Generalized osteosclerosis, likely related to chronic renal failure. Electronically Signed   By: Richardean Sale M.D.   On: 11/25/2020 14:39   DG CHEST PORT 1 VIEW  Result Date: 11/25/2020 CLINICAL DATA:  Shortness of breath EXAM: PORTABLE CHEST 1 VIEW COMPARISON:  11/22/2020 FINDINGS: No significant change in AP portable chest radiograph with cardiomegaly and diffuse bilateral interstitial opacity. Possible small layering pleural effusions. No new or focal airspace opacity. IMPRESSION: No  significant change in AP portable chest radiograph with cardiomegaly and diffuse bilateral interstitial opacity, likely reflecting edema. Possible small layering pleural effusions. Electronically Signed   By: Delanna Ahmadi M.D.   On: 11/25/2020 12:43   ECHOCARDIOGRAM COMPLETE  Result Date: 11/25/2020    ECHOCARDIOGRAM REPORT   Patient Name:   NOELY KUHNLE Date of Exam: 11/25/2020 Medical Rec #:  [id number]          Height:       68.0 in Accession #:    [id number]         Weight:       247.6 lb Date of Birth:  1965/10/26           BSA:          2.238 m Patient Age:    31 years           BP:           133/70 mmHg Patient Gender: F                  HR:           75 bpm. Exam Location:  Inpatient Procedure: 2D Echo, Cardiac Doppler and Color Doppler Indications:    CHF  History:        Patient has prior history of Echocardiogram examinations, most                 recent 10/23/2018. Risk Factors:Former Smoker and Family History                 of Coronary Artery Disease.  Sonographer:    Helmut Muster Referring Phys: [id number] [place] Shi  Sonographer Comments: Echo performed with patient supine and on artificial respirator and patient is morbidly obese. IMPRESSIONS  1. Left ventricular ejection fraction, by estimation, is 60 to 65%. The left ventricle has normal function. The left ventricle has no regional wall motion abnormalities. There is moderate left ventricular hypertrophy. Left ventricular diastolic parameters  are consistent with Grade II diastolic dysfunction (pseudonormalization). There is the interventricular septum is flattened in systole, consistent with right ventricular pressure overload.  2. Right ventricular systolic function is mildly reduced. The right ventricular size is mildly enlarged. There is moderately elevated pulmonary artery systolic pressure. The estimated right ventricular systolic pressure is 35.3 mmHg.  3. Left atrial size was severely dilated.  4. Right atrial size was  severely dilated.  5. The mitral valve is grossly normal. Mild to moderate mitral valve regurgitation. No evidence of mitral stenosis.  6. The aortic valve is tricuspid. There is mild calcification of the aortic valve. Aortic valve regurgitation is not visualized. Mild aortic valve sclerosis is present, with no evidence of aortic valve stenosis.  7. The inferior vena cava is dilated in size with <50% respiratory variability, suggesting right atrial pressure of 15 mmHg. Comparison(s): No significant change from prior study. FINDINGS  Left Ventricle: Left ventricular ejection fraction, by estimation, is 60 to 65%. The left ventricle has normal function. The left ventricle has no regional wall motion abnormalities. The left ventricular internal cavity size was normal in size. There is  moderate left ventricular hypertrophy. The interventricular septum is flattened in systole, consistent with right ventricular pressure overload. Left ventricular diastolic parameters are consistent with Grade II diastolic dysfunction (pseudonormalization). Right Ventricle: The right ventricular size is mildly enlarged. No increase in right ventricular wall thickness. Right ventricular systolic function is mildly reduced. There is moderately elevated pulmonary artery systolic pressure. The tricuspid regurgitant velocity is 2.80 m/s, and with an assumed right atrial pressure of 15 mmHg, the estimated right ventricular systolic pressure is 29.9 mmHg. Left Atrium: Left atrial size was severely dilated. Right Atrium: Right atrial size was severely dilated. Pericardium: Trivial pericardial effusion is present. Mitral Valve: The mitral valve is grossly normal. Mild to moderate mitral valve regurgitation. No evidence of mitral valve stenosis. Tricuspid Valve: The tricuspid valve is grossly normal. Tricuspid valve regurgitation is mild . No evidence of tricuspid stenosis. Aortic Valve: The aortic valve is tricuspid. There is mild calcification of  the aortic valve. Aortic valve regurgitation is not visualized. Mild aortic valve sclerosis is present, with no evidence of aortic valve stenosis. Pulmonic Valve: The pulmonic valve was grossly normal. Pulmonic valve regurgitation is trivial. No evidence of pulmonic stenosis. Aorta: The aortic root and ascending aorta are structurally normal, with no evidence of dilitation. Venous: The inferior vena cava is dilated in size with less than 50% respiratory variability, suggesting right atrial pressure of 15 mmHg. IAS/Shunts: The atrial septum is grossly normal.  LEFT VENTRICLE PLAX 2D LVIDd:         5.00 cm      Diastology LVIDs:         3.40 cm      LV e' medial:    9.03 cm/s LV PW:         1.50 cm      LV E/e' medial:  9.7 LV IVS:        1.50 cm      LV e' lateral:   9.90 cm/s LVOT diam:     2.00 cm      LV E/e' lateral: 8.9 LV SV:         69 LV SV Index:   31 LVOT Area:     3.14 cm  LV Volumes (MOD) LV vol d, MOD A2C: 146.0 ml LV vol d, MOD A4C: 124.0 ml LV vol s, MOD A2C: 53.9 ml LV vol  s, MOD A4C: 47.5 ml LV SV MOD A2C:     92.1 ml LV SV MOD A4C:     124.0 ml LV SV MOD BP:      83.4 ml RIGHT VENTRICLE            IVC RV S prime:     9.36 cm/s  IVC diam: 3.30 cm TAPSE (M-mode): 2.1 cm LEFT ATRIUM              Index        RIGHT ATRIUM           Index LA diam:        4.20 cm  1.88 cm/m   RA Area:     16.00 cm LA Vol (A2C):   127.0 ml 56.75 ml/m  RA Volume:   37.00 ml  16.53 ml/m LA Vol (A4C):   109.0 ml 48.71 ml/m LA Biplane Vol: 117.0 ml 52.28 ml/m  AORTIC VALVE LVOT Vmax:   120.00 cm/s LVOT Vmean:  63.500 cm/s LVOT VTI:    0.220 m  AORTA Ao Root diam: 3.40 cm Ao Asc diam:  3.62 cm MITRAL VALVE               TRICUSPID VALVE MV Area (PHT): 3.31 cm    TR Peak grad:   31.4 mmHg MV Decel Time: 230 msec    TR Vmax:        280.00 cm/s MV E velocity: 87.63 cm/s MV A velocity: 64.60 cm/s  SHUNTS MV E/A ratio:  1.36        Systemic VTI:  0.22 m                            Systemic Diam: 2.00 cm Eleonore Chiquito MD  Electronically signed by Eleonore Chiquito MD Signature Date/Time: 11/25/2020/12:07:18 PM    Final         Scheduled Meds:  sodium chloride   Intravenous Once   Chlorhexidine Gluconate Cloth  6 each Topical Daily   Chlorhexidine Gluconate Cloth  6 each Topical Q0600   Chlorhexidine Gluconate Cloth  6 each Topical Q0600   darbepoetin (ARANESP) injection - DIALYSIS  60 mcg Intravenous Q Wed-HD   ferric citrate  420 mg Oral TID WC   guaiFENesin  600 mg Oral BID   heparin  5,000 Units Subcutaneous Q8H   hydrOXYzine  25 mg Oral Daily   insulin aspart  0-5 Units Subcutaneous QHS   insulin aspart  0-6 Units Subcutaneous TID WC   insulin glargine-yfgn  14 Units Subcutaneous BID   metoprolol succinate  100 mg Oral Daily   multivitamin  1 tablet Oral QHS   mupirocin ointment  1 application Topical BID   pramipexole  0.5 mg Oral Daily   rosuvastatin  40 mg Oral Daily   Continuous Infusions:  sodium chloride Stopped (11/19/20 1239)     LOS: 9 days    Time spent: 34 minutes    Barb Merino, MD [facility] Pager [phone]

## 2020-11-26 NOTE — Progress Notes (Signed)
RT went to place patient on BiPAP QHS.  Patient waiting to go to Dialysis.  RN to call RT when patient arrives back to her room.

## 2020-11-26 NOTE — Progress Notes (Signed)
Physical Therapy Treatment Patient Details Name: [name] MRN: [id number] DOB: Jul 10, 1965 Today's Date: 11/26/2020   History of Present Illness 55 yo female presents to [facility] on 10/17 from HD with R knee pain since fall on 10/14. Worsening mental status and apnea in ED possibly due to narcotic overdose narcan administered, ETT 10/17-10/18, requiring bipap at night for retained CO2. CXR shows bilat infiltrates suspect PNA. AVF issues with early termination of HD on 10/19. Fall OOB 10/20.  PMH includes anxiety, RA, CAD, HF, ESRD on HD MWF, HTN, RLS, sleep apnea, thoracic ascending aortic aneurysm, DM II with retinopathy, obesity, peripheral neuropathy,  tobacco abuse, L thumb amputation.    PT Comments    Pt initially reports she is too fatigued for PT, but once PT mentioned hallway ambulation, pt agreeable. Pt ambulatory 2x45 ft with use of RW and close guard to light assist from PT. Pt fatigues rather quickly in [place], but able to direct seated rest breaks as needed. SpO2 maintained 91% and greater on 2LO2 during gait. PT to continue to follow and progress mobility as able.     Recommendations for follow up therapy are one component of a multi-disciplinary discharge planning process, led by the attending physician.  Recommendations may be updated based on patient status, additional functional criteria and insurance authorization.  Follow Up Recommendations  Home health PT     Assistance Recommended at Discharge Frequent or constant Supervision/Assistance  Equipment Recommendations  Rollator (4 wheels) (smaller rollator, not the wide one (does not fit through her doors))    Recommendations for Other Services       Precautions / Restrictions Precautions Precautions: Fall;Other (comment) Precaution Comments: on 2LO2 Restrictions Weight Bearing Restrictions: No     Mobility  Bed Mobility Overal bed mobility: Needs Assistance             General bed mobility comments: up in  chair    Transfers Overall transfer level: Needs assistance Equipment used: Rolling walker (2 wheels) Transfers: Sit to/from Stand Sit to Stand: Min assist           General transfer comment: min assist for rise and steady, STS x2 from EOB and hallway chair. Requires multiple boost attempts before successful rise to full stand.    Ambulation/Gait Ambulation/Gait assistance: Min guard Gait Distance (Feet): 45 Feet Assistive device: Rolling walker (2 wheels) Gait Pattern/deviations: Step-through pattern;Shuffle;Trunk flexed Gait velocity: decr   General Gait Details: close guard fo safety, seated rest break x1 to recover LE fatigue. SpO2 91% and greater on 2LO2 when accurate (pulse ox placed on ear for better reading)   Stairs             Wheelchair Mobility    Modified Rankin (Stroke Patients Only)       Balance Overall balance assessment: Needs assistance;History of Falls Sitting-balance support: Feet supported;No upper extremity supported Sitting balance-Leahy Scale: Good     Standing balance support: During functional activity Standing balance-Leahy Scale: Poor Standing balance comment: reliant on external support                            Cognition Arousal/Alertness: Awake/alert Behavior During Therapy: WFL for tasks assessed/performed (initially falling alseep intermittently) Overall Cognitive Status: No family/caregiver present to determine baseline cognitive functioning Area of Impairment: Attention;Memory;Safety/judgement;Problem solving;Awareness                 Orientation Level: Place (initially thought she was at home)  Current Attention Level: Selective Memory: Decreased short-term memory Following Commands: Follows one step commands with increased time Safety/Judgement: Decreased awareness of deficits;Decreased awareness of safety Awareness: Emergent Problem Solving: Slow processing;Requires verbal cues General Comments:  When working with new AE required multimodal cues for problem solving. Concern for carryover        Exercises      General Comments        Pertinent Vitals/Pain Pain Assessment: No/denies pain Pain Intervention(s): Monitored during session    Home Living                          Prior Function            PT Goals (current goals can now be found in the care plan section) Acute Rehab PT Goals Patient Stated Goal: home with family PT Goal Formulation: With patient Time For Goal Achievement: 12/04/20 Potential to Achieve Goals: Good Progress towards PT goals: Progressing toward goals    Frequency    Min 3X/week      PT Plan Current plan remains appropriate    Co-evaluation              AM-PAC PT "6 Clicks" Mobility   Outcome Measure  Help needed turning from your back to your side while in a flat bed without using bedrails?: A Little Help needed moving from lying on your back to sitting on the side of a flat bed without using bedrails?: A Little Help needed moving to and from a bed to a chair (including a wheelchair)?: A Little Help needed standing up from a chair using your arms (e.g., wheelchair or bedside chair)?: A Little Help needed to walk in hospital room?: A Little Help needed climbing 3-5 steps with a railing? : A Lot 6 Click Score: 17    End of Session Equipment Utilized During Treatment: Oxygen;Gait belt Activity Tolerance: Patient tolerated treatment well Patient left: with call bell/phone within reach;in chair;with chair alarm set Nurse Communication: Mobility status PT Visit Diagnosis: Other abnormalities of gait and mobility (R26.89);Muscle weakness (generalized) (M62.81)     Time: 5732-2025 PT Time Calculation (min) (ACUTE ONLY): 22 min  Charges:  $Gait Training: 8-22 mins                    Stacie Glaze, PT DPT Acute Rehabilitation Services Pager [phone]  Office [phone]    [place] 11/26/2020, 11:50 AM

## 2020-11-26 NOTE — Progress Notes (Signed)
[facility] NEPHROLOGY PROGRESS NOTE  Assessment/ Plan:  OP HD: G-O MWF  4h  450/800   111.5kg  2/2 bath  15ga  RUA AVF Hep 10,000  - calc 1.0 ug tiw   - parsabiv 5 mg IV tiw  #Acute hypoxic respiratory failure, suspected pneumonia and fluid overload.  Treated with Doxy and ceftriaxone.  She initially required mechanical ventilation, now extubated.  She received extra HD yesterday for ultrafiltration with UF around 3.3 L.  We will attempt UF with HD today.  She probably has underlying sleep apnea.  # ESRD, on MWF schedule.  Status post fistulogram by IR on 10/21 with patent fistula.  Status post dialysis yesterday because of fluid overload/shortness of breath with 3.3 L UF.  Tolerated well.  Breathing is better today.  We will continue regular HD today.  #Shock/hypotension: Resolved  # Anemia of CKD: Received PRBC, started ESA.  Monitor hemoglobin.  # Secondary hyperparathyroidism: Lower Auryxia dose as phosphorus is on the low side.  Monitor electrolytes.  # HTN/volume: Blood pressure acceptable, UF during HD.  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  Need further evaluation including oncology consult.  I will discuss this with the primary team.  Subjective: Seen and examined.  Shortness of breath is better but still looking somnolent.  Requiring oxygen via nasal cannula.  No new event.  Tolerated dialysis well yesterday.    Objective Vital signs in last 24 hours: Vitals:   11/25/20 2309 11/26/20 0235 11/26/20 0433 11/26/20 0728  BP: 94/80 131/75 121/66 (!) 142/63  Pulse: 70 73 74 72  Resp: 20 (!) 22 20 17   Temp: 98.6 F (37 C)  98 F (36.7 C) 98.2 F (36.8 C)  TempSrc: Oral  Oral Oral  SpO2: 97% 100% 90% 97%  Weight:       Weight change: 2.8 kg  Intake/Output Summary (Last 24 hours) at 11/26/2020 1026 Last data filed at 11/26/2020  0900 Gross per 24 hour  Intake 500 ml  Output 3335 ml  Net -2835 ml        Labs: Basic Metabolic Panel: Recent Labs  Lab 11/23/20 1807 11/24/20 0357 11/25/20 1254 11/25/20 1600  NA 134* 133* 132* 130*  K 4.0 4.4 4.1 4.2  CL 95* 96*  --  92*  CO2 27 25  --  28  GLUCOSE 229* 233*  --  349*  BUN 21* 32*  --  34*  CREATININE 4.89* 5.45*  --  4.92*  CALCIUM 8.8* 8.3*  --  8.5*  PHOS 2.8  --   --  3.1    Liver Function Tests: Recent Labs  Lab 11/25/20 1600  ALBUMIN 2.7*    No results for input(s): LIPASE, AMYLASE in the last 168 hours. No results for input(s): AMMONIA in the last 168 hours. CBC: Recent Labs  Lab 11/21/20 0334 11/21/20 1224 11/22/20 0332 11/23/20 1807 11/25/20 1254 11/25/20 1600  WBC 6.0  --  5.5 5.7  --  6.2  NEUTROABS 4.5  --  4.1  --   --   --   HGB 6.8*   < > 7.9* 8.9* 9.9* 8.3*  HCT 21.5*   < > 25.5* 28.7* 29.0* 27.2*  MCV 98.2  --  98.1 99.0  --  100.4*  PLT 207  --  203 214  --  214   < > = values in this interval not  displayed.    Cardiac Enzymes: No results for input(s): CKTOTAL, CKMB, CKMBINDEX, TROPONINI in the last 168 hours. CBG: Recent Labs  Lab 11/25/20 1109 11/25/20 1529 11/25/20 2038 11/26/20 0437 11/26/20 0726  GLUCAP 263* 285* 258* 320* 350*     Iron Studies: No results for input(s): IRON, TIBC, TRANSFERRIN, FERRITIN in the last 72 hours. Studies/Results: CT Angio Chest Pulmonary Embolism (PE) W or WO Contrast  Result Date: 11/25/2020 CLINICAL DATA:  Pulmonary embolism suspected, high probability. Decreased mobility. Persistent shortness of breath with hypoxia. Patient is reported to be immunocompromised. EXAM: CT ANGIOGRAPHY CHEST WITH CONTRAST TECHNIQUE: Multidetector CT imaging of the chest was performed using the standard protocol during bolus administration of intravenous contrast. Multiplanar CT image reconstructions and MIPs were obtained to evaluate the vascular anatomy. CONTRAST:  65mL OMNIPAQUE IOHEXOL  350 MG/ML SOLN COMPARISON:  Radiographs 11/25/2020 and 11/22/2020. CTA 02/19/2020. PET-CT 08/15/2019. FINDINGS: Cardiovascular: The pulmonary arteries are well opacified with contrast to the level of the subsegmental branches. There is no evidence of acute pulmonary embolism. There is central enlargement of the pulmonary arteries suspicious for pulmonary arterial hypertension. Atherosclerosis of the aorta, great vessels and coronary arteries noted. There is no aneurysm or dissection. The heart is moderately enlarged. No significant pericardial fluid. Mediastinum/Nodes: Stable mildly prominent mediastinal and axillary lymph nodes, including a 1.8 cm right paratracheal node on image 45/5. Based on stability, these nodes are likely reactive. The thyroid gland, trachea and esophagus appear stable, without significant findings. Lungs/Pleura: No significant pleural effusion or pneumothorax. Pulmonary evaluation mildly limited by breathing artifact. Mosaic attenuation again noted with patchy ground-glass opacities in both lungs. There are multiple new solid and sub solid pulmonary nodules in both lungs. Representative nodules include a 7 mm right upper lobe nodule on image 49/6, a 7 mm left lower lobe nodule on image 55/6 and a 7 mm right upper lobe nodule on image 56/6. Upper abdomen: No acute findings are seen within the visualized upper abdomen. The visualized spleen appears enlarged. The kidneys are not imaged in this patient who has a history of a mass involving the upper pole of the right kidney. Musculoskeletal/Chest wall: Diffuse osteosclerosis noted. No focal lytic lesion or acute osseous abnormality identified. No evidence of chest wall mass. Review of the MIP images confirms the above findings. IMPRESSION: 1. No evidence of acute pulmonary embolism or other acute vascular findings. 2. Cardiomegaly with central enlargement of the pulmonary arteries consistent with pulmonary arterial hypertension. 3. New bilateral  pulmonary nodules, suspicious for metastatic disease in this patient with a history of urologic cancer per previous PET-CT. Prior studies have also demonstrated a mass in the upper pole of the right kidney suspicious for renal cell carcinoma. 4. Underlying chronic mosaic attenuation of the lungs which may reflect edema or hypersensitivity pneumonitis. 5. Generalized osteosclerosis, likely related to chronic renal failure. Electronically Signed   By: Richardean Sale M.D.   On: 11/25/2020 14:39   DG CHEST PORT 1 VIEW  Result Date: 11/25/2020 CLINICAL DATA:  Shortness of breath EXAM: PORTABLE CHEST 1 VIEW COMPARISON:  11/22/2020 FINDINGS: No significant change in AP portable chest radiograph with cardiomegaly and diffuse bilateral interstitial opacity. Possible small layering pleural effusions. No new or focal airspace opacity. IMPRESSION: No significant change in AP portable chest radiograph with cardiomegaly and diffuse bilateral interstitial opacity, likely reflecting edema. Possible small layering pleural effusions. Electronically Signed   By: Delanna Ahmadi M.D.   On: 11/25/2020 12:43   ECHOCARDIOGRAM COMPLETE  Result Date:  11/25/2020    ECHOCARDIOGRAM REPORT   Patient Name:   [name] Date of Exam: 11/25/2020 Medical Rec #:  [id number]          Height:       68.0 in Accession #:    [id number]         Weight:       247.6 lb Date of Birth:  29-May-1965           BSA:          2.238 m Patient Age:    55 years           BP:           133/70 mmHg Patient Gender: F                  HR:           75 bpm. Exam Location:  Inpatient Procedure: 2D Echo, Cardiac Doppler and Color Doppler Indications:    CHF  History:        Patient has prior history of Echocardiogram examinations, most                 recent 10/23/2018. Risk Factors:Former Smoker and Family History                 of Coronary Artery Disease.  Sonographer:    Helmut Muster Referring Phys: [id number] [place] Shi  Sonographer Comments: Echo  performed with patient supine and on artificial respirator and patient is morbidly obese. IMPRESSIONS  1. Left ventricular ejection fraction, by estimation, is 60 to 65%. The left ventricle has normal function. The left ventricle has no regional wall motion abnormalities. There is moderate left ventricular hypertrophy. Left ventricular diastolic parameters are consistent with Grade II diastolic dysfunction (pseudonormalization). There is the interventricular septum is flattened in systole, consistent with right ventricular pressure overload.  2. Right ventricular systolic function is mildly reduced. The right ventricular size is mildly enlarged. There is moderately elevated pulmonary artery systolic pressure. The estimated right ventricular systolic pressure is 92.4 mmHg.  3. Left atrial size was severely dilated.  4. Right atrial size was severely dilated.  5. The mitral valve is grossly normal. Mild to moderate mitral valve regurgitation. No evidence of mitral stenosis.  6. The aortic valve is tricuspid. There is mild calcification of the aortic valve. Aortic valve regurgitation is not visualized. Mild aortic valve sclerosis is present, with no evidence of aortic valve stenosis.  7. The inferior vena cava is dilated in size with <50% respiratory variability, suggesting right atrial pressure of 15 mmHg. Comparison(s): No significant change from prior study. FINDINGS  Left Ventricle: Left ventricular ejection fraction, by estimation, is 60 to 65%. The left ventricle has normal function. The left ventricle has no regional wall motion abnormalities. The left ventricular internal cavity size was normal in size. There is  moderate left ventricular hypertrophy. The interventricular septum is flattened in systole, consistent with right ventricular pressure overload. Left ventricular diastolic parameters are consistent with Grade II diastolic dysfunction (pseudonormalization). Right Ventricle: The right ventricular size is  mildly enlarged. No increase in right ventricular wall thickness. Right ventricular systolic function is mildly reduced. There is moderately elevated pulmonary artery systolic pressure. The tricuspid regurgitant velocity is 2.80 m/s, and with an assumed right atrial pressure of 15 mmHg, the estimated right ventricular systolic pressure is 26.8 mmHg. Left Atrium: Left atrial size was severely dilated. Right Atrium: Right atrial size was severely dilated. Pericardium:  Trivial pericardial effusion is present. Mitral Valve: The mitral valve is grossly normal. Mild to moderate mitral valve regurgitation. No evidence of mitral valve stenosis. Tricuspid Valve: The tricuspid valve is grossly normal. Tricuspid valve regurgitation is mild . No evidence of tricuspid stenosis. Aortic Valve: The aortic valve is tricuspid. There is mild calcification of the aortic valve. Aortic valve regurgitation is not visualized. Mild aortic valve sclerosis is present, with no evidence of aortic valve stenosis. Pulmonic Valve: The pulmonic valve was grossly normal. Pulmonic valve regurgitation is trivial. No evidence of pulmonic stenosis. Aorta: The aortic root and ascending aorta are structurally normal, with no evidence of dilitation. Venous: The inferior vena cava is dilated in size with less than 50% respiratory variability, suggesting right atrial pressure of 15 mmHg. IAS/Shunts: The atrial septum is grossly normal.  LEFT VENTRICLE PLAX 2D LVIDd:         5.00 cm      Diastology LVIDs:         3.40 cm      LV e' medial:    9.03 cm/s LV PW:         1.50 cm      LV E/e' medial:  9.7 LV IVS:        1.50 cm      LV e' lateral:   9.90 cm/s LVOT diam:     2.00 cm      LV E/e' lateral: 8.9 LV SV:         69 LV SV Index:   31 LVOT Area:     3.14 cm  LV Volumes (MOD) LV vol d, MOD A2C: 146.0 ml LV vol d, MOD A4C: 124.0 ml LV vol s, MOD A2C: 53.9 ml LV vol s, MOD A4C: 47.5 ml LV SV MOD A2C:     92.1 ml LV SV MOD A4C:     124.0 ml LV SV MOD BP:       83.4 ml RIGHT VENTRICLE            IVC RV S prime:     9.36 cm/s  IVC diam: 3.30 cm TAPSE (M-mode): 2.1 cm LEFT ATRIUM              Index        RIGHT ATRIUM           Index LA diam:        4.20 cm  1.88 cm/m   RA Area:     16.00 cm LA Vol (A2C):   127.0 ml 56.75 ml/m  RA Volume:   37.00 ml  16.53 ml/m LA Vol (A4C):   109.0 ml 48.71 ml/m LA Biplane Vol: 117.0 ml 52.28 ml/m  AORTIC VALVE LVOT Vmax:   120.00 cm/s LVOT Vmean:  63.500 cm/s LVOT VTI:    0.220 m  AORTA Ao Root diam: 3.40 cm Ao Asc diam:  3.62 cm MITRAL VALVE               TRICUSPID VALVE MV Area (PHT): 3.31 cm    TR Peak grad:   31.4 mmHg MV Decel Time: 230 msec    TR Vmax:        280.00 cm/s MV E velocity: 87.63 cm/s MV A velocity: 64.60 cm/s  SHUNTS MV E/A ratio:  1.36        Systemic VTI:  0.22 m  Systemic Diam: 2.00 cm Eleonore Chiquito MD Electronically signed by Eleonore Chiquito MD Signature Date/Time: 11/25/2020/12:07:18 PM    Final     Medications: Infusions:  sodium chloride Stopped (11/19/20 1239)    Scheduled Medications:  sodium chloride   Intravenous Once   Chlorhexidine Gluconate Cloth  6 each Topical Daily   Chlorhexidine Gluconate Cloth  6 each Topical Q0600   Chlorhexidine Gluconate Cloth  6 each Topical Q0600   darbepoetin (ARANESP) injection - DIALYSIS  60 mcg Intravenous Q Wed-HD   ferric citrate  420 mg Oral TID WC   guaiFENesin  600 mg Oral BID   heparin  5,000 Units Subcutaneous Q8H   hydrOXYzine  25 mg Oral Daily   insulin aspart  0-5 Units Subcutaneous QHS   insulin aspart  0-6 Units Subcutaneous TID WC   insulin glargine-yfgn  14 Units Subcutaneous QHS   metoprolol succinate  100 mg Oral Daily   multivitamin  1 tablet Oral QHS   mupirocin ointment  1 application Topical BID   pramipexole  0.5 mg Oral Daily   rosuvastatin  40 mg Oral Daily    have reviewed scheduled and prn medications.  Physical Exam: General: Using oxygen via nasal cannula.  Somnolent Heart:RRR, s1s2  nl Lungs: No increased work of breathing, some rhonchi on bases. Abdomen:soft, Non-tender, non-distended Extremities:No edema Dialysis Access: Right upper extremity AV fistula has good thrill and bruit  Floetta Brickey Prasad Demitrius Crass 11/26/2020,10:26 AM  LOS: 9 days

## 2020-11-26 NOTE — Progress Notes (Signed)
OT treatment note:  Clinical Impression: Pt progressing towards OT goals. TOday focused on AE to make ADL more accessible due to previous thumb amputation. Pt able to perform teach back on sock donner and button hook, Pt provided with elastic laces. Pt also min guard for sit<>stand from recliner for adjustment/comfort in chair. At this time recommending [place] [facility]acute to maximize safety and independence in ADL and functional transfers due to struggles with bathing/dressing and performing IADL not only due to lack of thumb and neuropathy in hands - but also fatigue from HD. Getting and OT into the home to problem solve with Pt/family and promote independence and fall prevention is key.  Pt is an excellent candidate for thumb prosthetic - has ROM and enough residual stub for attachment and if she had the thumb she could participate better and be more independent in her ADL.    11/26/20 0900  OT Visit Information  Last OT Received On 11/26/20  Assistance Needed +1  History of Present Illness 55 yo female [facility] on [facility]17 from HD with R knee pain since fall on 10/14. Worsening mental status and apnea in ED possibly due to narcotic overdose narcan administered, ETT 10/17-10/18, requiring bipap at night for retained CO2. CXR shows bilat infiltrates suspect PNA. AVF issues with early termination of HD on 10/19. Fall OOB 10/20.  PMH includes anxiety, RA, CAD, HF, ESRD on HD MWF, HTN, RLS, sleep apnea, thoracic ascending aortic aneurysm, DM II with retinopathy, obesity, peripheral neuropathy,  tobacco abuse, L thumb amputation.  Precautions  Precautions Fall;Other (comment)  Precaution Comments watch 02  Restrictions  Weight Bearing Restrictions No  Pain Assessment  Pain Assessment No/denies pain  Pain Intervention(s) Monitored during session  Cognition  Arousal/Alertness Awake/alert  Behavior During Therapy WFL for tasks assessed/performed (initially falling alseep intermittently)  Overall  Cognitive Status No family/caregiver present to determine baseline cognitive functioning  Area of Impairment Attention;Memory;Safety/judgement;Problem solving;Awareness;Orientation  Orientation Level Place (initially thought she was at home)  Current Attention Level Selective  Memory Decreased short-term memory  Following Commands Follows one step commands with increased time  Safety/Judgement Decreased awareness of deficits;Decreased awareness of safety  Awareness Emergent  Problem Solving Slow processing;Requires verbal cues  General Comments When working with new AE required multimodal cues for problem solving. Concern for carryover  Upper Extremity Assessment  Upper Extremity Assessment Generalized weakness (L thumb amputation)  ADL  Overall ADL's  Needs assistance/impaired  Upper Body Dressing  Min guard;With adaptive equipment;Sitting  Upper Body Dressing Details (indicate cue type and reason) educated on button hook tool, Pt able to demo on activity board held against chest  Lower Body Dressing Min guard;Minimal assistance;With adaptive equipment  Lower Body Dressing Details (indicate cue type and reason) educated and pt able to perform teach back on sock aide, also provided and educated on elastic shoe laces  General ADL Comments session focused on AE due to thumb amputation from previous admission that impacts her ability to perform ADL  Bed Mobility  General bed mobility comments up in chair at beginning and end of session  Transfers  Overall transfer level Needs assistance  Equipment used None  Transfers Sit to/from Stand  Sit to Stand Min guard  General transfer comment sit<>stand from recliner for re-adjustment  Balance  Overall balance assessment Needs assistance;History of Falls  Sitting-balance support Feet supported;No upper extremity supported  Sitting balance-Leahy Scale Good  OT - End of Session  Equipment Utilized During Treatment Oxygen;Other (comment) (2L,  AE)  Activity Tolerance Patient tolerated treatment well  Patient left in chair;with call bell/phone within reach;with chair alarm set  Nurse Communication Mobility status  OT Assessment/Plan  OT Plan Discharge plan remains appropriate  OT Visit Diagnosis Unsteadiness on feet (R26.81);Other abnormalities of gait and mobility (R26.89);Muscle weakness (generalized) (M62.81);History of falling (Z91.81);Other symptoms and signs involving cognitive function  OT Frequency (ACUTE ONLY) Min 2X/week  Recommendations for Other Services Other (comment) (After HHOT completion - would really benefit from [place])  Follow Up Recommendations Home health OT  Assistance recommended at discharge Intermittent Supervision/Assistance  AM-PAC OT "6 Clicks" Daily Activity Outcome Measure (Version 2)  Help from another person eating meals? 3  Help from another person taking care of personal grooming? 3  Help from another person toileting, which includes using toliet, bedpan, or urinal? 3  Help from another person bathing (including washing, rinsing, drying)? 3  Help from another person to put on and taking off regular upper body clothing? 3  Help from another person to put on and taking off regular lower body clothing? 3  6 Click Score 18  Progressive Mobility  What is the highest level of mobility based on the progressive mobility assessment? Level 5 (Walks with assist in room/hall) - Balance while stepping forward/back and can walk in room with assist - Complete  Mobility Ambulated with assistance in room  OT Goal Progression  Progress towards OT goals Progressing toward goals  Acute Rehab OT Goals  Patient Stated Goal to be more independent  OT Goal Formulation With patient  Time For Goal Achievement 12/09/20  Potential to Achieve Goals Good  OT Time Calculation  OT Start Time (ACUTE ONLY) 0916  OT Stop Time (ACUTE ONLY) 0958  OT Time Calculation (min) 42 min  OT General Charges  $OT Visit 1 Visit  OT  Treatments  $Self Care/Home Management  23-37 mins  $Therapeutic Activity 8-22 mins   Jesse Sans OTR/L Acute Rehabilitation Services Pager: [phone] Office: [phone]

## 2020-11-27 DIAGNOSIS — R9389 Abnormal findings on diagnostic imaging of other specified body structures: Secondary | ICD-10-CM

## 2020-11-27 DIAGNOSIS — G9340 Encephalopathy, unspecified: Secondary | ICD-10-CM | POA: Diagnosis not present

## 2020-11-27 DIAGNOSIS — G4733 Obstructive sleep apnea (adult) (pediatric): Secondary | ICD-10-CM | POA: Diagnosis present

## 2020-11-27 LAB — RENAL FUNCTION PANEL
Albumin: 2.9 g/dL — ABNORMAL LOW (ref 3.5–5.0)
Anion gap: 11 (ref 5–15)
BUN: 43 mg/dL — ABNORMAL HIGH (ref 6–20)
CO2: 26 mmol/L (ref 22–32)
Calcium: 8.8 mg/dL — ABNORMAL LOW (ref 8.9–10.3)
Chloride: 91 mmol/L — ABNORMAL LOW (ref 98–111)
Creatinine, Ser: 4.91 mg/dL — ABNORMAL HIGH (ref 0.44–1.00)
GFR, Estimated: 10 mL/min — ABNORMAL LOW (ref >60)
Glucose, Bld: 242 mg/dL — ABNORMAL HIGH (ref 70–99)
Phosphorus: 2.4 mg/dL — ABNORMAL LOW (ref 2.5–4.6)
Potassium: 4 mmol/L (ref 3.5–5.1)
Sodium: 128 mmol/L — ABNORMAL LOW (ref 135–145)

## 2020-11-27 LAB — CBC WITH DIFFERENTIAL/PLATELET
Abs Immature Granulocytes: 0.12 10*3/uL — ABNORMAL HIGH (ref 0.00–0.07)
Basophils Absolute: 0 10*3/uL (ref 0.0–0.1)
Basophils Relative: 1 %
Eosinophils Absolute: 0.4 10*3/uL (ref 0.0–0.5)
Eosinophils Relative: 6 %
HCT: 26.6 % — ABNORMAL LOW (ref 36.0–46.0)
Hemoglobin: 8.2 g/dL — ABNORMAL LOW (ref 12.0–15.0)
Immature Granulocytes: 2 %
Lymphocytes Relative: 23 %
Lymphs Abs: 1.4 10*3/uL (ref 0.7–4.0)
MCH: 30.3 pg (ref 26.0–34.0)
MCHC: 30.8 g/dL (ref 30.0–36.0)
MCV: 98.2 fL (ref 80.0–100.0)
Monocytes Absolute: 0.6 10*3/uL (ref 0.1–1.0)
Monocytes Relative: 10 %
Neutro Abs: 3.5 10*3/uL (ref 1.7–7.7)
Neutrophils Relative %: 58 %
Platelets: 197 10*3/uL (ref 150–400)
RBC: 2.71 MIL/uL — ABNORMAL LOW (ref 3.87–5.11)
RDW: 14.3 % (ref 11.5–15.5)
WBC: 6 10*3/uL (ref 4.0–10.5)
nRBC: 0 % (ref 0.0–0.2)

## 2020-11-27 LAB — GLUCOSE, CAPILLARY
Glucose-Capillary: 159 mg/dL — ABNORMAL HIGH (ref 70–99)
Glucose-Capillary: 171 mg/dL — ABNORMAL HIGH (ref 70–99)
Glucose-Capillary: 225 mg/dL — ABNORMAL HIGH (ref 70–99)
Glucose-Capillary: 248 mg/dL — ABNORMAL HIGH (ref 70–99)

## 2020-11-27 NOTE — Progress Notes (Signed)
SATURATION QUALIFICATIONS: (This note is used to comply with regulatory documentation for home oxygen)  Patient Saturations on Room Air at Rest = 89%  Patient Saturations on Room Air while Ambulating = 84%  Patient Saturations on 6 Liters of oxygen while Ambulating = 90%  Please briefly explain why patient needs home oxygen:  Patient with dyspnea and desaturation with activity, required 6L oxygen to obtain saturations 90% and above.  At rest, patient maintains oxygen saturations of 92-95% on 2L/[place].

## 2020-11-27 NOTE — Progress Notes (Signed)
Patient ID: [name], female   DOB: [birth year], 55 y.o.   MRN: [id number] S: Feeling better today. O:BP 122/63 (BP Location: Left Wrist)   Pulse 80   Temp 98.1 F (36.7 C) (Oral)   Resp 20   Ht 5\' 8"  (1.727 m)   Wt 109.9 kg   LMP  (LMP Unknown) Comment: LMP Feb 2011  SpO2 98%   BMI 36.84 kg/m   Intake/Output Summary (Last 24 hours) at 11/27/2020 0938 Last data filed at 11/27/2020 0435 Gross per 24 hour  Intake 440 ml  Output 1000 ml  Net -560 ml   Intake/Output: I/O last 3 completed shifts: In: 690 [P.O.:690] Out: 6314 [Other:4335]  Intake/Output this shift:  No intake/output data recorded. Weight change: -4.2 kg Gen:NAD CVS: RRR Resp:CTA Abd: +BS, soft, TN/Nd Ext: trace pretibial edema, RUE AVF +T/B  Recent Labs  Lab 11/21/20 0334 11/22/20 0332 11/23/20 1807 11/24/20 0357 11/25/20 1254 11/25/20 1600 11/27/20 0223  NA 136 137 134* 133* 132* 130* 128*  K 4.4 3.9 4.0 4.4 4.1 4.2 4.0  CL 98 98 95* 96*  --  92* 91*  CO2 26 27 27 25   --  28 26  GLUCOSE 121* 112* 229* 233*  --  349* 242*  BUN 39* 18 21* 32*  --  34* 43*  CREATININE 8.05* 5.09* 4.89* 5.45*  --  4.92* 4.91*  ALBUMIN  --   --   --   --   --  2.7* 2.9*  CALCIUM 8.4* 8.9 8.8* 8.3*  --  8.5* 8.8*  PHOS  --   --  2.8  --   --  3.1 2.4*   Liver Function Tests: Recent Labs  Lab 11/25/20 1600 11/27/20 0223  ALBUMIN 2.7* 2.9*   No results for input(s): LIPASE, AMYLASE in the last 168 hours. No results for input(s): AMMONIA in the last 168 hours. CBC: Recent Labs  Lab 11/21/20 0334 11/21/20 1224 11/22/20 0332 11/23/20 1807 11/25/20 1254 11/25/20 1600 11/27/20 0223  WBC 6.0  --  5.5 5.7  --  6.2 6.0  NEUTROABS 4.5  --  4.1  --   --   --  3.5  HGB 6.8*   < > 7.9* 8.9* 9.9* 8.3* 8.2*  HCT 21.5*   < > 25.5* 28.7* 29.0* 27.2* 26.6*  MCV 98.2  --  98.1 99.0  --  100.4* 98.2  PLT 207  --  203 214  --  214 197   < > = values in this interval not displayed.   Cardiac Enzymes: No results  for input(s): CKTOTAL, CKMB, CKMBINDEX, TROPONINI in the last 168 hours. CBG: Recent Labs  Lab 11/26/20 1529 11/26/20 1953 11/27/20 0026 11/27/20 0454 11/27/20 0802  GLUCAP 259* 298* 248* 171* 159*    Iron Studies: No results for input(s): IRON, TIBC, TRANSFERRIN, FERRITIN in the last 72 hours. Studies/Results: CT Angio Chest Pulmonary Embolism (PE) W or WO Contrast  Result Date: 11/25/2020 CLINICAL DATA:  Pulmonary embolism suspected, high probability. Decreased mobility. Persistent shortness of breath with hypoxia. Patient is reported to be immunocompromised. EXAM: CT ANGIOGRAPHY CHEST WITH CONTRAST TECHNIQUE: Multidetector CT imaging of the chest was performed using the standard protocol during bolus administration of intravenous contrast. Multiplanar CT image reconstructions and MIPs were obtained to evaluate the vascular anatomy. CONTRAST:  27mL OMNIPAQUE IOHEXOL 350 MG/ML SOLN COMPARISON:  Radiographs 11/25/2020 and 11/22/2020. CTA 02/19/2020. PET-CT 08/15/2019. FINDINGS: Cardiovascular: The pulmonary arteries are well opacified with contrast to the level of  the subsegmental branches. There is no evidence of acute pulmonary embolism. There is central enlargement of the pulmonary arteries suspicious for pulmonary arterial hypertension. Atherosclerosis of the aorta, great vessels and coronary arteries noted. There is no aneurysm or dissection. The heart is moderately enlarged. No significant pericardial fluid. Mediastinum/Nodes: Stable mildly prominent mediastinal and axillary lymph nodes, including a 1.8 cm right paratracheal node on image 45/5. Based on stability, these nodes are likely reactive. The thyroid gland, trachea and esophagus appear stable, without significant findings. Lungs/Pleura: No significant pleural effusion or pneumothorax. Pulmonary evaluation mildly limited by breathing artifact. Mosaic attenuation again noted with patchy ground-glass opacities in both lungs. There are  multiple new solid and sub solid pulmonary nodules in both lungs. Representative nodules include a 7 mm right upper lobe nodule on image 49/6, a 7 mm left lower lobe nodule on image 55/6 and a 7 mm right upper lobe nodule on image 56/6. Upper abdomen: No acute findings are seen within the visualized upper abdomen. The visualized spleen appears enlarged. The kidneys are not imaged in this patient who has a history of a mass involving the upper pole of the right kidney. Musculoskeletal/Chest wall: Diffuse osteosclerosis noted. No focal lytic lesion or acute osseous abnormality identified. No evidence of chest wall mass. Review of the MIP images confirms the above findings. IMPRESSION: 1. No evidence of acute pulmonary embolism or other acute vascular findings. 2. Cardiomegaly with central enlargement of the pulmonary arteries consistent with pulmonary arterial hypertension. 3. New bilateral pulmonary nodules, suspicious for metastatic disease in this patient with a history of urologic cancer per previous PET-CT. Prior studies have also demonstrated a mass in the upper pole of the right kidney suspicious for renal cell carcinoma. 4. Underlying chronic mosaic attenuation of the lungs which may reflect edema or hypersensitivity pneumonitis. 5. Generalized osteosclerosis, likely related to chronic renal failure. Electronically Signed   By: Richardean Sale M.D.   On: 11/25/2020 14:39   DG CHEST PORT 1 VIEW  Result Date: 11/25/2020 CLINICAL DATA:  Shortness of breath EXAM: PORTABLE CHEST 1 VIEW COMPARISON:  11/22/2020 FINDINGS: No significant change in AP portable chest radiograph with cardiomegaly and diffuse bilateral interstitial opacity. Possible small layering pleural effusions. No new or focal airspace opacity. IMPRESSION: No significant change in AP portable chest radiograph with cardiomegaly and diffuse bilateral interstitial opacity, likely reflecting edema. Possible small layering pleural effusions.  Electronically Signed   By: Delanna Ahmadi M.D.   On: 11/25/2020 12:43   ECHOCARDIOGRAM COMPLETE  Result Date: 11/25/2020    ECHOCARDIOGRAM REPORT   Patient Name:   [name] Date of Exam: 11/25/2020 Medical Rec #:  [id number]          Height:       68.0 in Accession #:    [id number]         Weight:       247.6 lb Date of Birth:  1965-10-25           BSA:          2.238 m Patient Age:    47 years           BP:           133/70 mmHg Patient Gender: F                  HR:           75 bpm. Exam Location:  Inpatient Procedure: 2D Echo, Cardiac Doppler and Color Doppler  Indications:    CHF  History:        Patient has prior history of Echocardiogram examinations, most                 recent 10/23/2018. Risk Factors:Former Smoker and Family History                 of Coronary Artery Disease.  Sonographer:    Helmut Muster Referring Phys: [id number] [place] Shi  Sonographer Comments: Echo performed with patient supine and on artificial respirator and patient is morbidly obese. IMPRESSIONS  1. Left ventricular ejection fraction, by estimation, is 60 to 65%. The left ventricle has normal function. The left ventricle has no regional wall motion abnormalities. There is moderate left ventricular hypertrophy. Left ventricular diastolic parameters are consistent with Grade II diastolic dysfunction (pseudonormalization). There is the interventricular septum is flattened in systole, consistent with right ventricular pressure overload.  2. Right ventricular systolic function is mildly reduced. The right ventricular size is mildly enlarged. There is moderately elevated pulmonary artery systolic pressure. The estimated right ventricular systolic pressure is 45.4 mmHg.  3. Left atrial size was severely dilated.  4. Right atrial size was severely dilated.  5. The mitral valve is grossly normal. Mild to moderate mitral valve regurgitation. No evidence of mitral stenosis.  6. The aortic valve is tricuspid. There is mild  calcification of the aortic valve. Aortic valve regurgitation is not visualized. Mild aortic valve sclerosis is present, with no evidence of aortic valve stenosis.  7. The inferior vena cava is dilated in size with <50% respiratory variability, suggesting right atrial pressure of 15 mmHg. Comparison(s): No significant change from prior study. FINDINGS  Left Ventricle: Left ventricular ejection fraction, by estimation, is 60 to 65%. The left ventricle has normal function. The left ventricle has no regional wall motion abnormalities. The left ventricular internal cavity size was normal in size. There is  moderate left ventricular hypertrophy. The interventricular septum is flattened in systole, consistent with right ventricular pressure overload. Left ventricular diastolic parameters are consistent with Grade II diastolic dysfunction (pseudonormalization). Right Ventricle: The right ventricular size is mildly enlarged. No increase in right ventricular wall thickness. Right ventricular systolic function is mildly reduced. There is moderately elevated pulmonary artery systolic pressure. The tricuspid regurgitant velocity is 2.80 m/s, and with an assumed right atrial pressure of 15 mmHg, the estimated right ventricular systolic pressure is 09.8 mmHg. Left Atrium: Left atrial size was severely dilated. Right Atrium: Right atrial size was severely dilated. Pericardium: Trivial pericardial effusion is present. Mitral Valve: The mitral valve is grossly normal. Mild to moderate mitral valve regurgitation. No evidence of mitral valve stenosis. Tricuspid Valve: The tricuspid valve is grossly normal. Tricuspid valve regurgitation is mild . No evidence of tricuspid stenosis. Aortic Valve: The aortic valve is tricuspid. There is mild calcification of the aortic valve. Aortic valve regurgitation is not visualized. Mild aortic valve sclerosis is present, with no evidence of aortic valve stenosis. Pulmonic Valve: The pulmonic valve  was grossly normal. Pulmonic valve regurgitation is trivial. No evidence of pulmonic stenosis. Aorta: The aortic root and ascending aorta are structurally normal, with no evidence of dilitation. Venous: The inferior vena cava is dilated in size with less than 50% respiratory variability, suggesting right atrial pressure of 15 mmHg. IAS/Shunts: The atrial septum is grossly normal.  LEFT VENTRICLE PLAX 2D LVIDd:         5.00 cm      Diastology LVIDs:  3.40 cm      LV e' medial:    9.03 cm/s LV PW:         1.50 cm      LV E/e' medial:  9.7 LV IVS:        1.50 cm      LV e' lateral:   9.90 cm/s LVOT diam:     2.00 cm      LV E/e' lateral: 8.9 LV SV:         69 LV SV Index:   31 LVOT Area:     3.14 cm  LV Volumes (MOD) LV vol d, MOD A2C: 146.0 ml LV vol d, MOD A4C: 124.0 ml LV vol s, MOD A2C: 53.9 ml LV vol s, MOD A4C: 47.5 ml LV SV MOD A2C:     92.1 ml LV SV MOD A4C:     124.0 ml LV SV MOD BP:      83.4 ml RIGHT VENTRICLE            IVC RV S prime:     9.36 cm/s  IVC diam: 3.30 cm TAPSE (M-mode): 2.1 cm LEFT ATRIUM              Index        RIGHT ATRIUM           Index LA diam:        4.20 cm  1.88 cm/m   RA Area:     16.00 cm LA Vol (A2C):   127.0 ml 56.75 ml/m  RA Volume:   37.00 ml  16.53 ml/m LA Vol (A4C):   109.0 ml 48.71 ml/m LA Biplane Vol: 117.0 ml 52.28 ml/m  AORTIC VALVE LVOT Vmax:   120.00 cm/s LVOT Vmean:  63.500 cm/s LVOT VTI:    0.220 m  AORTA Ao Root diam: 3.40 cm Ao Asc diam:  3.62 cm MITRAL VALVE               TRICUSPID VALVE MV Area (PHT): 3.31 cm    TR Peak grad:   31.4 mmHg MV Decel Time: 230 msec    TR Vmax:        280.00 cm/s MV E velocity: 87.63 cm/s MV A velocity: 64.60 cm/s  SHUNTS MV E/A ratio:  1.36        Systemic VTI:  0.22 m                            Systemic Diam: 2.00 cm Eleonore Chiquito MD Electronically signed by Eleonore Chiquito MD Signature Date/Time: 11/25/2020/12:07:18 PM    Final     sodium chloride   Intravenous Once   darbepoetin (ARANESP) injection - DIALYSIS  60 mcg  Intravenous Q Wed-HD   ferric citrate  420 mg Oral TID WC   guaiFENesin  600 mg Oral BID   heparin  5,000 Units Subcutaneous Q8H   hydrOXYzine  25 mg Oral Daily   insulin aspart  0-5 Units Subcutaneous QHS   insulin aspart  0-6 Units Subcutaneous TID WC   insulin glargine-yfgn  14 Units Subcutaneous BID   metoprolol succinate  100 mg Oral Daily   multivitamin  1 tablet Oral QHS   mupirocin ointment  1 application Topical BID   pramipexole  0.5 mg Oral Daily   rosuvastatin  40 mg Oral Daily    BMET    Component Value Date/Time   NA 128 (L) 11/27/2020 6962  NA 135 06/01/2017 0959   K 4.0 11/27/2020 0223   CL 91 (L) 11/27/2020 0223   CO2 26 11/27/2020 0223   GLUCOSE 242 (H) 11/27/2020 0223   BUN 43 (H) 11/27/2020 0223   BUN 27 (H) 06/01/2017 0959   CREATININE 4.91 (H) 11/27/2020 0223   CREATININE 5.48 (HH) 03/27/2020 1408   CREATININE 1.96 (H) 07/19/2011 1201   CALCIUM 8.8 (L) 11/27/2020 0223   GFRNONAA 10 (L) 11/27/2020 0223   GFRNONAA 9 (L) 03/27/2020 1408   GFRAA 7 (L) 07/16/2019 1152   CBC    Component Value Date/Time   WBC 6.0 11/27/2020 0223   RBC 2.71 (L) 11/27/2020 0223   HGB 8.2 (L) 11/27/2020 0223   HGB 8.1 (L) 09/18/2018 1227   HCT 26.6 (L) 11/27/2020 0223   HCT 39.5 06/01/2017 0959   PLT 197 11/27/2020 0223   PLT 238 06/01/2017 0959   MCV 98.2 11/27/2020 0223   MCV 90 06/01/2017 0959   MCH 30.3 11/27/2020 0223   MCHC 30.8 11/27/2020 0223   RDW 14.3 11/27/2020 0223   RDW 18.4 (H) 06/01/2017 0959   LYMPHSABS 1.4 11/27/2020 0223   MONOABS 0.6 11/27/2020 0223   EOSABS 0.4 11/27/2020 0223   BASOSABS 0.0 11/27/2020 0223   OP HD: G-O MWF  4h  450/800   111.5kg  2/2 bath  15ga  RUA AVF Hep 10,000  - calc 1.0 ug tiw   - parsabiv 5 mg IV tiw  Assessment/Plan:  Acute hypoxic respiratory failure - extubated and on supplemental oxygen.  Suspected PNA and volume overload.  Responded to extra HD session.  ESRD - continue with MWF schedule  HTN/volume - UF  as tolerated and challenge edw.  She is 2 kg below edw today. Anemia of ESRD - s/p blood transfusion and may need to hold ESA due to concerning pulmonary nodules. Shock/hypotension - resolved. Bilateral pulmonary nodules - concerning for metastatic disease.  Has history of right renal cell carcinoma.  Will need further evaluation. CKD-MBD -   Donetta Potts, MD [employer] [phone]

## 2020-11-27 NOTE — TOC Transition Note (Signed)
Transition of Care [facility]) - CM/SW Discharge Note   Patient Details  Name: [name] MRN: [id number] Date of Birth: 1965/09/06  Transition of Care [facility]) CM/SW Contact:  Ella Bodo, RN Phone Number: 11/27/2020, 12:45pm   Clinical Narrative:    Patient medically stable for discharge home today with husband and son to assist with care, and home health services as previously arranged.  Notified [facility] home [facility] of patient discharge today.  [facility] notified of patient's need for home oxygen; new ambulating O2 sat note completed by patient's bedside nurse.  Portable tanks delivered to bedside prior to discharge.  Patient also has an order for Rollator, however insurance will not cover as patient already has a Rollator.  I have notified patient of this, and she does not wish to pay privately for this equipment.   Final next level of care: [place] Barriers to Discharge: Continued Medical Work up       Choice offered to / list presented to : Patient                      Discharge Plan and Services   Discharge Planning Services: CM Consult Post Acute Care Choice: Home Health          DME Arranged: Oxygen DME Agency: [facility] Date DME Agency Contacted: 11/24/20 Time DME Agency Contacted: [id number] Representative spoke with at DME Agency: [place] Arranged: PT [place]: [place] Date [place]: 11/24/20 Time [place]: 1446 Representative spoke with at [place]: [facility]    Readmission Risk Interventions Readmission Risk Prevention Plan 02/28/2020  Transportation Screening Complete  PCP or Specialist Appt within 5-7 Days Complete  Home Care Screening Complete  Medication Review (RN CM) Complete  Some recent data might be hidden   Reinaldo Raddle, RN, BSN  Trauma/Neuro ICU Case Manager [phone]

## 2020-11-27 NOTE — Progress Notes (Signed)
D/C order noted. Contacted pt's clinic, [facility] to make them aware that pt for d/c today and will resume care tomorrow.    Melven Sartorius Renal Navigator [phone]

## 2020-11-27 NOTE — Discharge Summary (Signed)
Physician Discharge Summary  [name]:[id number] DOB: 11-11-1965 DOA: 11/17/2020  PCP: Sandi Mariscal, MD  Admit date: 11/17/2020 Discharge date: 11/27/2020  Admitted From: Home Disposition: Home with home health  Recommendations for Outpatient Follow-up:  Follow up with PCP in 1-2 weeks Go for a scheduled hemodialysis tomorrow. Will refer back to pulmonary, will communicate with the urology service to follow-up on new renal cell tumor.  Home Health: PT/OT/RN/home health aide Equipment/Devices: Oxygen, Rollator  Discharge Condition: Stable CODE STATUS: Full code Diet recommendation: Low-salt and low-carb diet.  Fluid restriction 1500 mL/day.    Discharge summary: ESRD on hemodialysis, hypertension hyperlipidemia, type 2 diabetes on insulin, rheumatoid arthritis on Humira, anemia of chronic disease, obstructive sleep apnea with inconclusive sleep study presented to the hospital with fall and right knee pain.  She was given a dose of Dilaudid in the ER for knee pain following which she developed a period of apnea, hypotension and hypoxia where she was intubated for airway protection and admitted to ICU.   10/18-extubated to BiPAP, hemodialysis.  Transferred to [facility] service on 10/20.  Treated for aspiration pneumonia with Rocephin and doxycycline.  Mental status improved but she required frequent BiPAP and oxygen support.      Acute hypoxemic and hypercarbic respiratory failure: Resolved.  Currently on 2 to 3 L of oxygen. Suspect secondary to pneumonia versus pulmonary edema.  Also with underlying sleep apnea.  Intubated on mechanical ventilation, extubated on day 2. Currently remains on nasal cannula oxygen to keep saturation more than 90%, using BiPAP at night. Much improved with extra sessions of hemodialysis.  Completed antibiotic therapy. Discharging home with supplemental oxygen on mobility. Clinical evidence of sleep apnea, incomplete sleep apnea studies last month, pulmonary  will reschedule follow-up.  With current incompletely study and normal blood gas, she does not qualify for noninvasive ventilator.  Will discharge home with oxygen and refer back to pulmonary for follow-up.  ESRD on hemodialysis: Getting dialysis on schedule.  Good response to extra sessions of dialysis yesterday.  Next dialysis tomorrow.  Fall and right knee pain: Soft tissue injury.  Avoid opiates.  Type 2 diabetes on insulin: Appetite improving.  Blood sugars improving.  Resume home doses of insulin.  Delirium and acute metabolic encephalopathy: Due to ICU delirium.  Improved.  Normalized.   Rheumatoid arthritis: On chronic Humira .  She will resume Humira once discharged.   Abnormal CT scan of the chest:  A CTA done 10/25 negative for PE.  Negative for pneumonia. It shows multiple scattered nodules about 7 mm in size.  Also has history of rheumatoid arthritis. Apparently patient has history of renal tumor and abnormal PET scan last year.  Lung nodules were not present at that time. Called and discussed case with Dr. Irene Limbo from oncology who referred to urology as patient was following up and was on observation treatment. Message sent to Dr. Tresa Moore who has been following her up for renal tumor.  Could not see any positive scans. Lung nodules are not amenable to bronchoscopy and she is high risk for percutaneous biopsy.  If she has concerning renal lesion, may benefit with a biopsy. Ambulatory referral sent to pulmonary for follow-up of lung tumors.   Discharge Diagnoses:  Active Problems:   Encephalopathy acute   Abnormal CT scan, chest    Discharge Instructions  Discharge Instructions     Ambulatory referral to Pulmonology   Complete by: As directed    Patient in need for repeat sleep apnea studies.  Patient also noted to have multiple pulmonary nodules that needs follow-up.   Reason for referral: Lung Mass/Lung Nodule   Call MD for:  difficulty breathing, headache or visual  disturbances   Complete by: As directed    Diet - low sodium heart healthy   Complete by: As directed    Diet Carb Modified   Complete by: As directed    Discharge wound care:   Complete by: As directed    Keep leg wounds clean dry and covered.  Change dressing for any swelling or soakage.   Increase activity slowly   Complete by: As directed       Allergies as of 11/27/2020       Reactions   Bupropion Itching        Medication List     STOP taking these medications    calcitRIOL 0.5 MCG capsule Commonly known as: ROCALTROL   carbamide peroxide 6.5 % OTIC solution Commonly known as: DEBROX   lidocaine 5 % Commonly known as: Lidoderm   magic mouthwash Soln   methocarbamol 500 MG tablet Commonly known as: ROBAXIN   MIRCERA IJ   mupirocin ointment 2 % Commonly known as: BACTROBAN       TAKE these medications    acetaminophen 500 MG tablet Commonly known as: TYLENOL Take 500-1,000 mg by mouth daily as needed for headache (pain).   albuterol 108 (90 Base) MCG/ACT inhaler Commonly known as: VENTOLIN HFA Inhale 2 puffs into the lungs every 6 (six) hours as needed for wheezing or shortness of breath.   Auryxia 1 GM 210 MG(Fe) tablet Generic drug: ferric citrate Take 840-1,050 mg by mouth See admin instructions. Take 5 tablets (1050 mg) by mouth twice daily with meals, occasionally take 4 tablets (840 mg) with a snack   b complex-vitamin c-folic acid 0.8 MG Tabs tablet Take 1 tablet by mouth every Monday, Wednesday, and Friday with hemodialysis.   FISH OIL PO Take 1 capsule by mouth every morning.   Humira Pen 40 MG/0.4ML Pnkt Generic drug: Adalimumab Inject 40 mg into the skin every 14 (fourteen) days.   hydrOXYzine 25 MG tablet Commonly known as: ATARAX/VISTARIL Take 25 mg by mouth every morning.   Insulin Lispro Prot & Lispro (75-25) 100 UNIT/ML Kwikpen Commonly known as: HUMALOG 75/25 MIX Inject 35 Units into the skin 2 (two) times daily with  a meal. What changed: when to take this   metoprolol succinate 100 MG 24 hr tablet Commonly known as: TOPROL-XL Take 100 mg by mouth daily. Take with or immediately following a meal.   Narcan 4 MG/0.1ML Liqd nasal spray kit Generic drug: naloxone Place 1 spray into the nose as needed (accidental overdose.).   OneTouch Verio test strip Generic drug: glucose blood 1 each by Other route 2 (two) times daily. And lancets 2/day   OVER THE COUNTER MEDICATION Take 1 capsule by mouth every morning. Omega XL   oxyCODONE-acetaminophen 10-325 MG tablet Commonly known as: PERCOCET Take 1 tablet by mouth 3 (three) times daily as needed for pain.   pramipexole 0.5 MG tablet Commonly known as: MIRAPEX Take 0.5 mg by mouth daily.   rosuvastatin 40 MG tablet Commonly known as: CRESTOR TAKE 1 TABLET BY MOUTH DAILY *PATIENT NEEDS APPOINTMENT* What changed: See the new instructions.   VITAMIN C PO Take 1 tablet by mouth every morning.   ZINC PO Take 1 tablet by mouth every morning.  Durable Medical Equipment  (From admission, onward)           Start     Ordered   11/24/20 1538  For home use only DME Other see comment  Once       Comments: Patient needs rollator.  Question:  Length of Need  Answer:  12 Months   11/24/20 1537   11/24/20 1514  For home use only DME oxygen  Once       Question Answer Comment  Length of Need Lifetime   Mode or (Route) Nasal cannula   Liters per Minute 4   Frequency Continuous (stationary and portable oxygen unit needed)   Oxygen conserving device Yes   Oxygen delivery system Gas      11/24/20 1513              Discharge Care Instructions  (From admission, onward)           Start     Ordered   11/27/20 0000  Discharge wound care:       Comments: Keep leg wounds clean dry and covered.  Change dressing for any swelling or soakage.   11/27/20 0954            Follow-up Information     [facility] Follow up.   Specialty: Home Health Services Why: someone from [place] contact you to arrnge start date and time for your therapy. Contact information: [address] [place] [address] [phone]         [facility] Follow up.   Why: Oxygen will be delivered by [employer]. Shouold you have any probolems or concerns please call [employer] at number listed [phone] Contact information: [address] [place] [address] [phone]                Allergies  Allergen Reactions   Bupropion Itching    Consultations: Pulmonary critical care Nephrology   Procedures/Studies: DG Shoulder Right  Result Date: 11/12/2020 CLINICAL DATA:  Fall, shoulder pain EXAM: RIGHT SHOULDER - 2+ VIEW COMPARISON:  Shoulder radiographs 06/05/2019 FINDINGS: There is no acute fracture or dislocation. Shoulder alignment is maintained. Degenerative changes about the [facility] joint are similar to the prior study. A right arm vascular stent and vascular calcifications are noted. IMPRESSION: No acute fracture or dislocation. Electronically Signed   By: Valetta Mole M.D.   On: 11/12/2020 16:37   DG Knee 1-2 Views Right  Result Date: 11/18/2020 CLINICAL DATA:  Pain EXAM: RIGHT KNEE - 1-2 VIEW COMPARISON:  None. 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 Prepatellar soft tissue swelling. Possible small joint effusion. Regional arterial calcification is noted. No acute bone finding. 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 Small joint effusion. Prepatellar soft tissue swelling. Electronically Signed   By: Nelson Chimes M.D.   On: 11/18/2020 13:12   CT Head Wo Contrast  Result Date: 11/17/2020 CLINICAL DATA:  Mental status changes of unknown cause EXAM: CT HEAD WITHOUT CONTRAST TECHNIQUE: Contiguous axial images were obtained from the base of the skull through the vertex  without intravenous contrast. COMPARISON:  04/23/2020 FINDINGS: Brain: Age related volume loss. There are chronic areas of low-density within the deep white matter including the corpus callosum. Findings could relate to chronic multiple sclerosis or small vessel disease. No evidence of recent infarction, mass lesion, hemorrhage, hydrocephalus or extra-axial collection. Vascular: There is atherosclerotic calcification of the major vessels at the  base of the brain. Skull: Negative Sinuses/Orbits: Mild mucosal inflammation of the sinuses. Orbits negative. Other: None IMPRESSION: Chronic appearing white matter insults affecting both cerebral hemispheres which could be due to chronic small vessel disease and or demyelinating disease. Given the vascular risk factors, this may simply represent atherosclerotic vascular disease. No sign of acute CT abnormality. Electronically Signed   By: Nelson Chimes M.D.   On: 11/17/2020 19:49   CT Angio Chest Pulmonary Embolism (PE) W or WO Contrast  Result Date: 11/25/2020 CLINICAL DATA:  Pulmonary embolism suspected, high probability. Decreased mobility. Persistent shortness of breath with hypoxia. Patient is reported to be immunocompromised. EXAM: CT ANGIOGRAPHY CHEST WITH CONTRAST TECHNIQUE: Multidetector CT imaging of the chest was performed using the standard protocol during bolus administration of intravenous contrast. Multiplanar CT image reconstructions and MIPs were obtained to evaluate the vascular anatomy. CONTRAST:  83m OMNIPAQUE IOHEXOL 350 MG/ML SOLN COMPARISON:  Radiographs 11/25/2020 and 11/22/2020. CTA 02/19/2020. PET-CT 08/15/2019. FINDINGS: Cardiovascular: The pulmonary arteries are well opacified with contrast to the level of the subsegmental branches. There is no evidence of acute pulmonary embolism. There is central enlargement of the pulmonary arteries suspicious for pulmonary arterial hypertension. Atherosclerosis of the aorta, great vessels and coronary  arteries noted. There is no aneurysm or dissection. The heart is moderately enlarged. No significant pericardial fluid. Mediastinum/Nodes: Stable mildly prominent mediastinal and axillary lymph nodes, including a 1.8 cm right paratracheal node on image 45/5. Based on stability, these nodes are likely reactive. The thyroid gland, trachea and esophagus appear stable, without significant findings. Lungs/Pleura: No significant pleural effusion or pneumothorax. Pulmonary evaluation mildly limited by breathing artifact. Mosaic attenuation again noted with patchy ground-glass opacities in both lungs. There are multiple new solid and sub solid pulmonary nodules in both lungs. Representative nodules include a 7 mm right upper lobe nodule on image 49/6, a 7 mm left lower lobe nodule on image 55/6 and a 7 mm right upper lobe nodule on image 56/6. Upper abdomen: No acute findings are seen within the visualized upper abdomen. The visualized spleen appears enlarged. The kidneys are not imaged in this patient who has a history of a mass involving the upper pole of the right kidney. Musculoskeletal/Chest wall: Diffuse osteosclerosis noted. No focal lytic lesion or acute osseous abnormality identified. No evidence of chest wall mass. Review of the MIP images confirms the above findings. IMPRESSION: 1. No evidence of acute pulmonary embolism or other acute vascular findings. 2. Cardiomegaly with central enlargement of the pulmonary arteries consistent with pulmonary arterial hypertension. 3. New bilateral pulmonary nodules, suspicious for metastatic disease in this patient with a history of urologic cancer per previous PET-CT. Prior studies have also demonstrated a mass in the upper pole of the right kidney suspicious for renal cell carcinoma. 4. Underlying chronic mosaic attenuation of the lungs which may reflect edema or hypersensitivity pneumonitis. 5. Generalized osteosclerosis, likely related to chronic renal failure.  Electronically Signed   By: WRichardean SaleM.D.   On: 11/25/2020 14:39   IR [place] Vasc Access Right  Result Date: 11/23/2020 INDICATION: 55year old female with history of end-stage renal disease presenting with poorly functioning right upper extremity brachiobasilic fistula. EXAM: DIALYSIS AV FISTULAGRAM COMPARISON:  None. MEDICATIONS: None. CONTRAST:  235mOMNIPAQUE IOHEXOL 300 MG/ML  SOLN ANESTHESIA/SEDATION: None. FLUOROSCOPY TIME:  0 minutes.  Six seconds.  Twenty-five mGy COMPLICATIONS: None immediate. PROCEDURE: The left arm dialysis graft was prepped with ChloraPrep in a sterile fashion, and a sterile drape was applied  covering the operative field. A diagnostic shunt study was performed via an 18 gauge angiocatheter introduced into venous outflow. Venous drainage was assessed to the level of the central veins in the chest. Arterial anastomosis, arterial inflow, puncture zone, and peripheral venous outflow were examined by grayscale and color Doppler. The angiocath was removed and hemostasis was achieved with manual compression. A dressing was placed. The patient tolerated the procedure well without immediate post procedural complication. FINDINGS: Ultrasound evaluation demonstrates widely patent brachial basilic anastomosis. In the peripheral puncture zone there is surrounding hematoma about the fistula. The puncture zone and central outflow are widely patent sonographically. There is brisk antegrade flow through the puncture zone, indwelling peripheral outflow stents, and central basilic vein. There is moderate stenosis about the cephalic arch confluence where there is an abandoned indwelling stent. The central veins are widely patent with brisk antegrade flow into the superior vena cava. IMPRESSION: 1. Moderate stenosis about the cephalic arch confluence, however unlikely to be flow limiting given brisk antegrade flow. The remaining fistula is widely patent. Therefore, no intervention was  performed. 2. Puncture zone hematoma surrounding the fistula, likely indicative of imprecise prior access. ACCESS: This access remains amenable to future percutaneous interventions as clinically indicated. Ruthann Cancer, MD [facility] Electronically Signed   By: Ruthann Cancer M.D.   On: 11/23/2020 08:10   DG CHEST PORT 1 VIEW  Result Date: 11/25/2020 CLINICAL DATA:  Shortness of breath EXAM: PORTABLE CHEST 1 VIEW COMPARISON:  11/22/2020 FINDINGS: No significant change in AP portable chest radiograph with cardiomegaly and diffuse bilateral interstitial opacity. Possible small layering pleural effusions. No new or focal airspace opacity. IMPRESSION: No significant change in AP portable chest radiograph with cardiomegaly and diffuse bilateral interstitial opacity, likely reflecting edema. Possible small layering pleural effusions. Electronically Signed   By: Delanna Ahmadi M.D.   On: 11/25/2020 12:43   DG CHEST PORT 1 VIEW  Result Date: 11/22/2020 CLINICAL DATA:  Dyspnea on exertion EXAM: PORTABLE CHEST 1 VIEW COMPARISON:  November 17, 2020 FINDINGS: The cardiomediastinal silhouette is unchanged and enlarged in contour.Small RIGHT pleural effusion. No pneumothorax. Diffuse interstitial prominence. Scattered LEFT basilar linear opacities most consistent with atelectasis. Visualized abdomen is unremarkable. IMPRESSION: Constellation of findings are favored to reflect pulmonary edema with scattered atelectasis. Electronically Signed   By: Valentino Saxon M.D.   On: 11/22/2020 18:08   DG Chest Port 1 View  Result Date: 11/17/2020 CLINICAL DATA:  Post intubation EXAM: PORTABLE CHEST 1 VIEW COMPARISON:  02/25/2020 FINDINGS: Endotracheal tube tip is about 3 cm superior to the carina. Esophageal tube tip below the diaphragm but incompletely visualized. Cardiomegaly with vascular congestion and probable pulmonary edema. More confluent ground-glass  opacity at the right apex and patchy consolidations at the bases. Widened mediastinal silhouette probably due to portable technique and lordotic positioning. IMPRESSION: 1. Endotracheal tube tip about 3 cm superior to carina 2. Cardiomegaly with vascular congestion and probable pulmonary edema. 3. More focal areas of consolidation and ground-glass opacity at the right apex and both bases suspicious for superimposed pneumonia 4. Enlarged mediastinal silhouette, likely augmented by portable technique and lordotic view. Electronically Signed   By: Donavan Foil M.D.   On: 11/17/2020 17:40   DG Knee Complete 4 Views Left  Result Date: 11/17/2020 CLINICAL DATA:  Right knee pain EXAM: LEFT KNEE - COMPLETE 4+ VIEW; DG HIP (WITH OR WITHOUT PELVIS) 2-3V RIGHT COMPARISON:  None. FINDINGS: No evidence of fracture or dislocation. Mild degenerative  changes of the right hip. Mild-to-moderate degenerative changes of the bilateral SI joints, evaluation somewhat limited due to overlying bowel gas. Limited evaluation of the sacrum and left iliac wing. Calcification in the pelvis, likely calcified fibroid. Single AP view of the left hip demonstrates mild degenerative changes in no acute osseous abnormality. Vascular calcifications. No acute fracture or dislocation of the left knee. Moderate left knee joint effusion. Moderate tricompartmental degenerative changes. Vascular calcifications. IMPRESSION: No acute osseous abnormality. Moderate left knee joint effusion. Electronically Signed   By: Yetta Glassman M.D.   On: 11/17/2020 14:12   DG Hand Complete Right  Result Date: 11/12/2020 CLINICAL DATA:  Fall, hand swelling EXAM: RIGHT HAND - COMPLETE 3+ VIEW COMPARISON:  None. FINDINGS: There is no acute fracture or dislocation. Bony alignment is normal. There is mild degenerative change at the index finger CMC joint likely reflecting osteoarthritis. There is diffuse soft tissue swelling about the hand. There is no soft tissue  gas. There is no radiopaque foreign body. IMPRESSION: 1. Diffuse soft tissue swelling about the hand; no soft tissue gas or radiopaque foreign body. 2. No acute osseous abnormality. 3. Mild degenerative changes at the index finger CMC joint. Electronically Signed   By: Valetta Mole M.D.   On: 11/12/2020 16:40   IR DIALY SHUNT INTRO NEEDLE/INTRACATH INITIAL W/IMG RIGHT  Result Date: 11/23/2020 INDICATION: 55 year old female with history of end-stage renal disease presenting with poorly functioning right upper extremity brachiobasilic fistula. EXAM: DIALYSIS AV FISTULAGRAM COMPARISON:  None. MEDICATIONS: None. CONTRAST:  33m OMNIPAQUE IOHEXOL 300 MG/ML  SOLN ANESTHESIA/SEDATION: None. FLUOROSCOPY TIME:  0 minutes.  Six seconds.  Twenty-five mGy COMPLICATIONS: None immediate. PROCEDURE: The left arm dialysis graft was prepped with ChloraPrep in a sterile fashion, and a sterile drape was applied covering the operative field. A diagnostic shunt study was performed via an 18 gauge angiocatheter introduced into venous outflow. Venous drainage was assessed to the level of the central veins in the chest. Arterial anastomosis, arterial inflow, puncture zone, and peripheral venous outflow were examined by grayscale and color Doppler. The angiocath was removed and hemostasis was achieved with manual compression. A dressing was placed. The patient tolerated the procedure well without immediate post procedural complication. FINDINGS: Ultrasound evaluation demonstrates widely patent brachial basilic anastomosis. In the peripheral puncture zone there is surrounding hematoma about the fistula. The puncture zone and central outflow are widely patent sonographically. There is brisk antegrade flow through the puncture zone, indwelling peripheral outflow stents, and central basilic vein. There is moderate stenosis about the cephalic arch confluence where there is an abandoned indwelling stent. The central veins are widely patent  with brisk antegrade flow into the superior vena cava. IMPRESSION: 1. Moderate stenosis about the cephalic arch confluence, however unlikely to be flow limiting given brisk antegrade flow. The remaining fistula is widely patent. Therefore, no intervention was performed. 2. Puncture zone hematoma surrounding the fistula, likely indicative of imprecise prior access. ACCESS: This access remains amenable to future percutaneous interventions as clinically indicated. DRuthann Cancer MD [facility] Electronically Signed   By: DRuthann CancerM.D.   On: 11/23/2020 08:10   ECHOCARDIOGRAM COMPLETE  Result Date: 11/25/2020    ECHOCARDIOGRAM REPORT   Patient Name:   TMEGEN MADEWELLDate of Exam: 11/25/2020 Medical Rec #:  [id number]         Height:       68.0 in Accession #:    [id number]  Weight:       247.6 lb Date of Birth:  [birth year]           BSA:          2.238 m Patient Age:    55 years           BP:           133/70 mmHg Patient Gender: F                  HR:           75 bpm. Exam Location:  Inpatient Procedure: 2D Echo, Cardiac Doppler and Color Doppler Indications:    CHF  History:        Patient has prior history of Echocardiogram examinations, most                 recent 10/23/2018. Risk Factors:Former Smoker and Family History                 of Coronary Artery Disease.  Sonographer:    Helmut Muster Referring Phys: [id number] [place]  Sonographer Comments: Echo performed with patient supine and on artificial respirator and patient is morbidly obese. IMPRESSIONS  1. Left ventricular ejection fraction, by estimation, is 60 to 65%. The left ventricle has normal function. The left ventricle has no regional wall motion abnormalities. There is moderate left ventricular hypertrophy. Left ventricular diastolic parameters are consistent with Grade II diastolic dysfunction (pseudonormalization). There is the interventricular septum is flattened in  systole, consistent with right ventricular pressure overload.  2. Right ventricular systolic function is mildly reduced. The right ventricular size is mildly enlarged. There is moderately elevated pulmonary artery systolic pressure. The estimated right ventricular systolic pressure is 41.6 mmHg.  3. Left atrial size was severely dilated.  4. Right atrial size was severely dilated.  5. The mitral valve is grossly normal. Mild to moderate mitral valve regurgitation. No evidence of mitral stenosis.  6. The aortic valve is tricuspid. There is mild calcification of the aortic valve. Aortic valve regurgitation is not visualized. Mild aortic valve sclerosis is present, with no evidence of aortic valve stenosis.  7. The inferior vena cava is dilated in size with <50% respiratory variability, suggesting right atrial pressure of 15 mmHg. Comparison(s): No significant change from prior study. FINDINGS  Left Ventricle: Left ventricular ejection fraction, by estimation, is 60 to 65%. The left ventricle has normal function. The left ventricle has no regional wall motion abnormalities. The left ventricular internal cavity size was normal in size. There is  moderate left ventricular hypertrophy. The interventricular septum is flattened in systole, consistent with right ventricular pressure overload. Left ventricular diastolic parameters are consistent with Grade II diastolic dysfunction (pseudonormalization). Right Ventricle: The right ventricular size is mildly enlarged. No increase in right ventricular wall thickness. Right ventricular systolic function is mildly reduced. There is moderately elevated pulmonary artery systolic pressure. The tricuspid regurgitant velocity is 2.80 m/s, and with an assumed right atrial pressure of 15 mmHg, the estimated right ventricular systolic pressure is 60.6 mmHg. Left Atrium: Left atrial size was severely dilated. Right Atrium: Right atrial size was severely dilated. Pericardium: Trivial  pericardial effusion is present. Mitral Valve: The mitral valve is grossly normal. Mild to moderate mitral valve regurgitation. No evidence of mitral valve stenosis. Tricuspid Valve: The tricuspid valve is grossly normal. Tricuspid valve regurgitation is mild . No evidence of tricuspid stenosis. Aortic Valve: The aortic valve is tricuspid. There is mild calcification of  the aortic valve. Aortic valve regurgitation is not visualized. Mild aortic valve sclerosis is present, with no evidence of aortic valve stenosis. Pulmonic Valve: The pulmonic valve was grossly normal. Pulmonic valve regurgitation is trivial. No evidence of pulmonic stenosis. Aorta: The aortic root and ascending aorta are structurally normal, with no evidence of dilitation. Venous: The inferior vena cava is dilated in size with less than 50% respiratory variability, suggesting right atrial pressure of 15 mmHg. IAS/Shunts: The atrial septum is grossly normal.  LEFT VENTRICLE PLAX 2D LVIDd:         5.00 cm      Diastology LVIDs:         3.40 cm      LV e' medial:    9.03 cm/s LV PW:         1.50 cm      LV E/e' medial:  9.7 LV IVS:        1.50 cm      LV e' lateral:   9.90 cm/s LVOT diam:     2.00 cm      LV E/e' lateral: 8.9 LV SV:         69 LV SV Index:   31 LVOT Area:     3.14 cm  LV Volumes (MOD) LV vol d, MOD A2C: 146.0 ml LV vol d, MOD A4C: 124.0 ml LV vol s, MOD A2C: 53.9 ml LV vol s, MOD A4C: 47.5 ml LV SV MOD A2C:     92.1 ml LV SV MOD A4C:     124.0 ml LV SV MOD BP:      83.4 ml RIGHT VENTRICLE            IVC RV S prime:     9.36 cm/s  IVC diam: 3.30 cm TAPSE (M-mode): 2.1 cm LEFT ATRIUM              Index        RIGHT ATRIUM           Index LA diam:        4.20 cm  1.88 cm/m   RA Area:     16.00 cm LA Vol (A2C):   127.0 ml 56.75 ml/m  RA Volume:   37.00 ml  16.53 ml/m LA Vol (A4C):   109.0 ml 48.71 ml/m LA Biplane Vol: 117.0 ml 52.28 ml/m  AORTIC VALVE LVOT Vmax:   120.00 cm/s LVOT Vmean:  63.500 cm/s LVOT VTI:    0.220 m  AORTA Ao  Root diam: 3.40 cm Ao Asc diam:  3.62 cm MITRAL VALVE               TRICUSPID VALVE MV Area (PHT): 3.31 cm    TR Peak grad:   31.4 mmHg MV Decel Time: 230 msec    TR Vmax:        280.00 cm/s MV E velocity: 87.63 cm/s MV A velocity: 64.60 cm/s  SHUNTS MV E/A ratio:  1.36        Systemic VTI:  0.22 m                            Systemic Diam: 2.00 cm Eleonore Chiquito MD Electronically signed by Eleonore Chiquito MD Signature Date/Time: 11/25/2020/12:07:18 PM    Final    DG Hip Unilat With Pelvis 2-3 Views Right  Result Date: 11/17/2020 CLINICAL DATA:  Right knee pain EXAM: LEFT KNEE - COMPLETE 4+ VIEW; DG  HIP (WITH OR WITHOUT PELVIS) 2-3V RIGHT COMPARISON:  None. FINDINGS: No evidence of fracture or dislocation. Mild degenerative changes of the right hip. Mild-to-moderate degenerative changes of the bilateral SI joints, evaluation somewhat limited due to overlying bowel gas. Limited evaluation of the sacrum and left iliac wing. Calcification in the pelvis, likely calcified fibroid. Single AP view of the left hip demonstrates mild degenerative changes in no acute osseous abnormality. Vascular calcifications. No acute fracture or dislocation of the left knee. Moderate left knee joint effusion. Moderate tricompartmental degenerative changes. Vascular calcifications. IMPRESSION: No acute osseous abnormality. Moderate left knee joint effusion. Electronically Signed   By: Yetta Glassman M.D.   On: 11/17/2020 14:12   (Echo, Carotid, EGD, Colonoscopy, ERCP)    Subjective: Patient seen and examined.  She is on 2 to 3 L of oxygen.  Feels better today.  Extra dialysis helped.  Denies any complaints today.  Did not wear BiPAP last night.  Feels like he can go home. I updated patient and her sister about the abnormal CT scan and following up with urology and also with pulmonary.   Discharge Exam: Vitals:   11/27/20 0451 11/27/20 0804  BP: 132/61 122/63  Pulse: 75 80  Resp: 20 20  Temp: 98.3 F (36.8 C) 98.1 F (36.7  C)  SpO2: 100% 98%   Vitals:   11/27/20 0430 11/27/20 0435 11/27/20 0451 11/27/20 0804  BP: 110/70 120/72 132/61 122/63  Pulse: 80 80 75 80  Resp:  20 20 20   Temp:  98.2 F (36.8 C) 98.3 F (36.8 C) 98.1 F (36.7 C)  TempSrc:  Oral Oral Oral  SpO2:  100% 100% 98%  Weight:  109.9 kg    Height:        General: Pt is alert, awake, not in acute distress On 2 L oxygen.  Looks comfortable.  Sitting at the edge of the bed.  Not in any distress. Cardiovascular: RRR, S1/S2 +, no rubs, no gallops Respiratory: CTA bilaterally, no wheezing, no rhonchi Abdominal: Soft, NT, ND, bowel sounds + Extremities: Chronic bilateral pedal edema.  Venous stasis changes and stasis ulcers.    The results of significant diagnostics from this hospitalization (including imaging, microbiology, ancillary and laboratory) are listed below for reference.     Microbiology: Recent Results (from the past 240 hour(s))  MRSA Next Gen by PCR, Nasal     Status: None   Collection Time: 11/17/20 10:28 PM   Specimen: Nasal Mucosa; Nasal Swab  Result Value Ref Range Status   MRSA by PCR Next Gen NOT DETECTED NOT DETECTED Final    Comment: (NOTE) The GeneXpert MRSA Assay (FDA approved for NASAL specimens only), is one component of a comprehensive MRSA colonization surveillance program. It is not intended to diagnose MRSA infection nor to guide or monitor treatment for MRSA infections. Test performance is not FDA approved in patients less than 49 years old. Performed at [facility], [place] [address], [place], [place] [address]   Culture, blood (routine x 2)     Status: None   Collection Time: 11/18/20 12:09 AM   Specimen: BLOOD RIGHT HAND  Result Value Ref Range Status   Specimen Description BLOOD RIGHT HAND  Final   Special Requests   Final    BOTTLES DRAWN AEROBIC ONLY Blood Culture adequate volume   Culture   Final    NO GROWTH 5 DAYS Performed at [facility], [address] [place],  [place] [address]  Report Status 11/23/2020 FINAL  Final  Culture, blood (routine x 2)     Status: None   Collection Time: 11/18/20 12:19 AM   Specimen: BLOOD  Result Value Ref Range Status   Specimen Description BLOOD LEFT ANTECUBITAL  Final   Special Requests   Final    BOTTLES DRAWN AEROBIC AND ANAEROBIC Blood Culture adequate volume   Culture   Final    NO GROWTH 5 DAYS Performed at [facility], [address] [place], [place] [address]    Report Status 11/23/2020 FINAL  Final  Resp Panel by RT-PCR (Flu A&B, Covid) Nasopharyngeal Swab     Status: None   Collection Time: 11/18/20  4:08 AM   Specimen: Nasopharyngeal Swab; Nasopharyngeal(NP) swabs in vial transport medium  Result Value Ref Range Status   SARS Coronavirus 2 by RT PCR NEGATIVE NEGATIVE Final    Comment: (NOTE) SARS-CoV-2 target nucleic acids are NOT DETECTED.  The SARS-CoV-2 RNA is generally detectable in upper respiratory specimens during the acute phase of infection. The lowest concentration of SARS-CoV-2 viral copies this assay can detect is 138 copies/mL. A negative result does not preclude SARS-Cov-2 infection and should not be used as the sole basis for treatment or other patient management decisions. A negative result may occur with  improper specimen collection/handling, submission of specimen other than nasopharyngeal swab, presence of viral mutation(s) within the areas targeted by this assay, and inadequate number of viral copies(<138 copies/mL). A negative result must be combined with clinical observations, patient history, and epidemiological information. The expected result is Negative.  Fact Sheet for Patients:  EntrepreneurPulse.com.au  Fact Sheet for Healthcare Providers:  IncredibleEmployment.be  This test is no t yet approved or cleared by the [place] FDA and  has been authorized for detection and/or diagnosis of SARS-CoV-2 by FDA under an  Emergency Use Authorization (EUA). This EUA will remain  in effect (meaning this test can be used) for the duration of the COVID-19 declaration under Section 564(b)(1) of the Act, 21 U.S.C.section 360bbb-3(b)(1), unless the authorization is terminated  or revoked sooner.       Influenza A by PCR NEGATIVE NEGATIVE Final   Influenza B by PCR NEGATIVE NEGATIVE Final    Comment: (NOTE) The Xpert Xpress SARS-CoV-2/FLU/RSV plus assay is intended as an aid in the diagnosis of influenza from Nasopharyngeal swab specimens and should not be used as a sole basis for treatment. Nasal washings and aspirates are unacceptable for Xpert Xpress SARS-CoV-2/FLU/RSV testing.  Fact Sheet for Patients: EntrepreneurPulse.com.au  Fact Sheet for Healthcare Providers: IncredibleEmployment.be  This test is not yet approved or cleared by the [place] FDA and has been authorized for detection and/or diagnosis of SARS-CoV-2 by FDA under an Emergency Use Authorization (EUA). This EUA will remain in effect (meaning this test can be used) for the duration of the COVID-19 declaration under Section 564(b)(1) of the Act, 21 U.S.C. section 360bbb-3(b)(1), unless the authorization is terminated or revoked.  Performed at [facility], [place] [address] [place], [place] [address]   Culture, Respiratory w Gram Stain     Status: None   Collection Time: 11/18/20 10:14 AM   Specimen: Tracheal Aspirate; Respiratory  Result Value Ref Range Status   Specimen Description TRACHEAL ASPIRATE  Final   Special Requests NONE  Final   Gram Stain   Final    ABUNDANT WBC PRESENT,BOTH PMN AND MONONUCLEAR RARE GRAM POSITIVE COCCI RARE GRAM VARIABLE ROD    Culture  Final    RARE Normal respiratory flora-no Staph aureus or Pseudomonas seen Performed at [facility] [address] [place], [place] [address]    Report Status 11/20/2020 FINAL  Final  Expectorated Sputum  Assessment w Gram Stain, Rflx to Resp Cult     Status: None   Collection Time: 11/26/20 10:07 AM   Specimen: Expectorated Sputum  Result Value Ref Range Status   Specimen Description EXPECTORATED SPUTUM  Final   Special Requests Immunocompromised  Final   Sputum evaluation   Final    Sputum specimen not acceptable for testing.  Please recollect.   RESULT CALLED TO, READ BACK BY AND VERIFIED WITH: RN Toni Amend [id number] FCP Performed at [facility], [place] [address] [place], [place] [address]    Report Status 11/26/2020 FINAL  Final     Labs: BNP (last 3 results) Recent Labs    02/20/20 0206 02/21/20 0514 02/26/20 0004  BNP 1,385.7* 1,069.9* 947.0*   Basic Metabolic Panel: Recent Labs  Lab 11/22/20 0332 11/23/20 1807 11/24/20 0357 11/25/20 1254 11/25/20 1600 11/27/20 0223  NA 137 134* 133* 132* 130* 128*  K 3.9 4.0 4.4 4.1 4.2 4.0  CL 98 95* 96*  --  92* 91*  CO2 27 27 25   --  28 26  GLUCOSE 112* 229* 233*  --  349* 242*  BUN 18 21* 32*  --  34* 43*  CREATININE 5.09* 4.89* 5.45*  --  4.92* 4.91*  CALCIUM 8.9 8.8* 8.3*  --  8.5* 8.8*  MG  --  2.0  --   --   --   --   PHOS  --  2.8  --   --  3.1 2.4*   Liver Function Tests: Recent Labs  Lab 11/25/20 1600 11/27/20 0223  ALBUMIN 2.7* 2.9*   No results for input(s): LIPASE, AMYLASE in the last 168 hours. No results for input(s): AMMONIA in the last 168 hours. CBC: Recent Labs  Lab 11/21/20 0334 11/21/20 1224 11/22/20 0332 11/23/20 1807 11/25/20 1254 11/25/20 1600 11/27/20 0223  WBC 6.0  --  5.5 5.7  --  6.2 6.0  NEUTROABS 4.5  --  4.1  --   --   --  3.5  HGB 6.8*   < > 7.9* 8.9* 9.9* 8.3* 8.2*  HCT 21.5*   < > 25.5* 28.7* 29.0* 27.2* 26.6*  MCV 98.2  --  98.1 99.0  --  100.4* 98.2  PLT 207  --  203 214  --  214 197   < > = values in this interval not displayed.   Cardiac Enzymes: No results for input(s): CKTOTAL, CKMB, CKMBINDEX, TROPONINI in the last 168 hours. BNP: Invalid input(s):  POCBNP CBG: Recent Labs  Lab 11/26/20 1529 11/26/20 1953 11/27/20 0026 11/27/20 0454 11/27/20 0802  GLUCAP 259* 298* 248* 171* 159*   D-Dimer No results for input(s): DDIMER in the last 72 hours. Hgb A1c No results for input(s): HGBA1C in the last 72 hours. Lipid Profile No results for input(s): CHOL, HDL, LDLCALC, TRIG, CHOLHDL, LDLDIRECT in the last 72 hours. Thyroid function studies No results for input(s): TSH, T4TOTAL, T3FREE, THYROIDAB in the last 72 hours.  Invalid input(s): FREET3 Anemia work up No results for input(s): VITAMINB12, FOLATE, FERRITIN, TIBC, IRON, RETICCTPCT in the last 72 hours. Urinalysis    Component Value Date/Time   COLORURINE YELLOW 07/22/2011 1526   APPEARANCEUR CLOUDY (A) 07/22/2011 1526   LABSPEC 1.022 07/22/2011 1526  PHURINE 5.5 07/22/2011 1526   GLUCOSEU 100 (A) 07/22/2011 1526   HGBUR MODERATE (A) 07/22/2011 1526   BILIRUBINUR NEGATIVE 07/22/2011 1526   BILIRUBINUR NEG 07/19/2011 1202   KETONESUR NEGATIVE 07/22/2011 1526   PROTEINUR >300 (A) 07/22/2011 1526   UROBILINOGEN 0.2 07/22/2011 1526   NITRITE NEGATIVE 07/22/2011 1526   LEUKOCYTESUR TRACE (A) 07/22/2011 1526   Sepsis Labs Invalid input(s): PROCALCITONIN,  WBC,  LACTICIDVEN Microbiology Recent Results (from the past 240 hour(s))  MRSA Next Gen by PCR, Nasal     Status: None   Collection Time: 11/17/20 10:28 PM   Specimen: Nasal Mucosa; Nasal Swab  Result Value Ref Range Status   MRSA by PCR Next Gen NOT DETECTED NOT DETECTED Final    Comment: (NOTE) The GeneXpert MRSA Assay (FDA approved for NASAL specimens only), is one component of a comprehensive MRSA colonization surveillance program. It is not intended to diagnose MRSA infection nor to guide or monitor treatment for MRSA infections. Test performance is not FDA approved in patients less than 69 years old. Performed at [facility], [place] [address], [place], [place] [address]   Culture, blood (routine x 2)      Status: None   Collection Time: 11/18/20 12:09 AM   Specimen: BLOOD RIGHT HAND  Result Value Ref Range Status   Specimen Description BLOOD RIGHT HAND  Final   Special Requests   Final    BOTTLES DRAWN AEROBIC ONLY Blood Culture adequate volume   Culture   Final    NO GROWTH 5 DAYS Performed at [facility], [address] [place], [place] [address]    Report Status 11/23/2020 FINAL  Final  Culture, blood (routine x 2)     Status: None   Collection Time: 11/18/20 12:19 AM   Specimen: BLOOD  Result Value Ref Range Status   Specimen Description BLOOD LEFT ANTECUBITAL  Final   Special Requests   Final    BOTTLES DRAWN AEROBIC AND ANAEROBIC Blood Culture adequate volume   Culture   Final    NO GROWTH 5 DAYS Performed at [facility], [place] [address] [place], [place] [address]    Report Status 11/23/2020 FINAL  Final  Resp Panel by RT-PCR (Flu A&B, Covid) Nasopharyngeal Swab     Status: None   Collection Time: 11/18/20  4:08 AM   Specimen: Nasopharyngeal Swab; Nasopharyngeal(NP) swabs in vial transport medium  Result Value Ref Range Status   SARS Coronavirus 2 by RT PCR NEGATIVE NEGATIVE Final    Comment: (NOTE) SARS-CoV-2 target nucleic acids are NOT DETECTED.  The SARS-CoV-2 RNA is generally detectable in upper respiratory specimens during the acute phase of infection. The lowest concentration of SARS-CoV-2 viral copies this assay can detect is 138 copies/mL. A negative result does not preclude SARS-Cov-2 infection and should not be used as the sole basis for treatment or other patient management decisions. A negative result may occur with  improper specimen collection/handling, submission of specimen other than nasopharyngeal swab, presence of viral mutation(s) within the areas targeted by this assay, and inadequate number of viral copies(<138 copies/mL). A negative result must be combined with clinical observations, patient history, and  epidemiological information. The expected result is Negative.  Fact Sheet for Patients:  EntrepreneurPulse.com.au  Fact Sheet for Healthcare Providers:  IncredibleEmployment.be  This test is no t yet approved or cleared by the [place] FDA and  has been authorized for detection and/or diagnosis of SARS-CoV-2 by FDA under an  Emergency Use Authorization (EUA). This EUA will remain  in effect (meaning this test can be used) for the duration of the COVID-19 declaration under Section 564(b)(1) of the Act, 21 U.S.C.section 360bbb-3(b)(1), unless the authorization is terminated  or revoked sooner.       Influenza A by PCR NEGATIVE NEGATIVE Final   Influenza B by PCR NEGATIVE NEGATIVE Final    Comment: (NOTE) The Xpert Xpress SARS-CoV-2/FLU/RSV plus assay is intended as an aid in the diagnosis of influenza from Nasopharyngeal swab specimens and should not be used as a sole basis for treatment. Nasal washings and aspirates are unacceptable for Xpert Xpress SARS-CoV-2/FLU/RSV testing.  Fact Sheet for Patients: EntrepreneurPulse.com.au  Fact Sheet for Healthcare Providers: IncredibleEmployment.be  This test is not yet approved or cleared by the [place] FDA and has been authorized for detection and/or diagnosis of SARS-CoV-2 by FDA under an Emergency Use Authorization (EUA). This EUA will remain in effect (meaning this test can be used) for the duration of the COVID-19 declaration under Section 564(b)(1) of the Act, 21 U.S.C. section 360bbb-3(b)(1), unless the authorization is terminated or revoked.  Performed at [facility], [place] [address], [place], [place] [address]   Culture, Respiratory w Gram Stain     Status: None   Collection Time: 11/18/20 10:14 AM   Specimen: Tracheal Aspirate; Respiratory  Result Value Ref Range Status   Specimen Description TRACHEAL ASPIRATE  Final   Special  Requests NONE  Final   Gram Stain   Final    ABUNDANT WBC PRESENT,BOTH PMN AND MONONUCLEAR RARE GRAM POSITIVE COCCI RARE GRAM VARIABLE ROD    Culture   Final    RARE Normal respiratory flora-no Staph aureus or Pseudomonas seen Performed at [facility], [address], [place], [place] [address]    Report Status 11/20/2020 FINAL  Final  Expectorated Sputum Assessment w Gram Stain, Rflx to Resp Cult     Status: None   Collection Time: 11/26/20 10:07 AM   Specimen: Expectorated Sputum  Result Value Ref Range Status   Specimen Description EXPECTORATED SPUTUM  Final   Special Requests Immunocompromised  Final   Sputum evaluation   Final    Sputum specimen not acceptable for testing.  Please recollect.   RESULT CALLED TO, READ BACK BY AND VERIFIED WITH: RN Toni Amend [phone] FCP Performed at [facility], [place] [address] [place], [place] [address]    Report Status 11/26/2020 FINAL  Final     Time coordinating discharge: 45 minutes  SIGNED:   Barb Merino, MD  [facility] 11/27/2020, 9:54 AM

## 2020-11-28 ENCOUNTER — Telehealth: Payer: Self-pay | Admitting: Physician Assistant

## 2020-11-28 NOTE — Telephone Encounter (Signed)
Transition of care contact from inpatient facility  Date of Discharge: 11/27/20 Date of Contact: 11/28/20 Method of contact: Phone  Attempted to contact patient to discuss transition of care from inpatient admission. Patient did not answer the phone. Phone rang repeatedly then cut off, unable to LM.  Anice Paganini, PA-C 11/28/2020, 1:22 PM  [employer]

## 2020-12-04 ENCOUNTER — Other Ambulatory Visit: Payer: Self-pay | Admitting: Cardiology

## 2021-01-08 ENCOUNTER — Emergency Department (HOSPITAL_COMMUNITY)
Admission: EM | Admit: 2021-01-08 | Discharge: 2021-01-08 | Disposition: A | Discharge status: Home / Self Care | Payer: Medicare Other | Source: Home / Self Care

## 2021-01-08 ENCOUNTER — Emergency Department (HOSPITAL_COMMUNITY): Payer: Medicare Other

## 2021-01-08 ENCOUNTER — Encounter (HOSPITAL_COMMUNITY): Payer: Self-pay

## 2021-01-08 ENCOUNTER — Other Ambulatory Visit: Payer: Self-pay

## 2021-01-08 DIAGNOSIS — E1169 Type 2 diabetes mellitus with other specified complication: Secondary | ICD-10-CM | POA: Diagnosis not present

## 2021-01-08 DIAGNOSIS — Z5321 Procedure and treatment not carried out due to patient leaving prior to being seen by health care provider: Secondary | ICD-10-CM | POA: Insufficient documentation

## 2021-01-08 DIAGNOSIS — M79645 Pain in left finger(s): Secondary | ICD-10-CM | POA: Insufficient documentation

## 2021-01-08 DIAGNOSIS — M869 Osteomyelitis, unspecified: Secondary | ICD-10-CM | POA: Diagnosis not present

## 2021-01-08 LAB — COMPREHENSIVE METABOLIC PANEL
ALT: 18 U/L (ref 0–44)
AST: 22 U/L (ref 15–41)
Albumin: 3.2 g/dL — ABNORMAL LOW (ref 3.5–5.0)
Alkaline Phosphatase: 196 U/L — ABNORMAL HIGH (ref 38–126)
Anion gap: 14 (ref 5–15)
BUN: 32 mg/dL — ABNORMAL HIGH (ref 6–20)
CO2: 26 mmol/L (ref 22–32)
Calcium: 8.8 mg/dL — ABNORMAL LOW (ref 8.9–10.3)
Chloride: 90 mmol/L — ABNORMAL LOW (ref 98–111)
Creatinine, Ser: 4.98 mg/dL — ABNORMAL HIGH (ref 0.44–1.00)
GFR, Estimated: 10 mL/min — ABNORMAL LOW (ref >60)
Glucose, Bld: 422 mg/dL — ABNORMAL HIGH (ref 70–99)
Potassium: 5 mmol/L (ref 3.5–5.1)
Sodium: 130 mmol/L — ABNORMAL LOW (ref 135–145)
Total Bilirubin: 0.6 mg/dL (ref 0.3–1.2)
Total Protein: 7.9 g/dL (ref 6.5–8.1)

## 2021-01-08 LAB — CBC WITH DIFFERENTIAL/PLATELET
Abs Immature Granulocytes: 0.07 10*3/uL (ref 0.00–0.07)
Basophils Absolute: 0 10*3/uL (ref 0.0–0.1)
Basophils Relative: 1 %
Eosinophils Absolute: 0.2 10*3/uL (ref 0.0–0.5)
Eosinophils Relative: 3 %
HCT: 30.7 % — ABNORMAL LOW (ref 36.0–46.0)
Hemoglobin: 9.8 g/dL — ABNORMAL LOW (ref 12.0–15.0)
Immature Granulocytes: 1 %
Lymphocytes Relative: 13 %
Lymphs Abs: 1.1 10*3/uL (ref 0.7–4.0)
MCH: 31.3 pg (ref 26.0–34.0)
MCHC: 31.9 g/dL (ref 30.0–36.0)
MCV: 98.1 fL (ref 80.0–100.0)
Monocytes Absolute: 0.4 10*3/uL (ref 0.1–1.0)
Monocytes Relative: 5 %
Neutro Abs: 6.2 10*3/uL (ref 1.7–7.7)
Neutrophils Relative %: 77 %
Platelets: 191 10*3/uL (ref 150–400)
RBC: 3.13 MIL/uL — ABNORMAL LOW (ref 3.87–5.11)
RDW: 15.5 % (ref 11.5–15.5)
WBC: 8 10*3/uL (ref 4.0–10.5)
nRBC: 0 % (ref 0.0–0.2)

## 2021-01-08 LAB — SEDIMENTATION RATE: Sed Rate: 123 mm/hr — ABNORMAL HIGH (ref 0–22)

## 2021-01-08 LAB — C-REACTIVE PROTEIN: CRP: 9.3 mg/dL — ABNORMAL HIGH (ref <1.0)

## 2021-01-08 LAB — LACTIC ACID, PLASMA: Lactic Acid, Venous: 2.5 mmol/L (ref 0.5–1.9)

## 2021-01-08 MED ORDER — ACETAMINOPHEN 325 MG PO TABS
650.0000 mg | ORAL_TABLET | Freq: Once | ORAL | Status: AC
  Administered 2021-01-08: 650 mg via ORAL
  Filled 2021-01-08: qty 2

## 2021-01-08 NOTE — ED Provider Notes (Signed)
Emergency Medicine Provider Triage Evaluation Note  [name] , a 55 y.o. female  was evaluated in triage.  Pt complains of left index finger pain x 3-4 days, now with purulent drainage from tip of finger. Right hand dominant. 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 Pt is a diabetic, denies injury to the finger.   Review of Systems  Positive: Finger pain, drainage  Negative: fever  Physical Exam  BP 138/60 (BP Location: Left Arm)   Pulse (!) 111   Temp 98.9 F (37.2 C) (Oral)   Resp 20   Ht 5\' 8"  (1.727 m)   Wt 110 kg   LMP  (LMP Unknown) Comment: LMP Feb 2011  SpO2 94%   BMI 36.87 kg/m  Gen:   Awake, no distress   Resp:  Normal effort  MSK:   Moves extremities without difficulty  Other:  Tenderness to left index finger with swelling and trace purulent drainage from tip of finger. No pain in remaining hand. Reports decreased sensation to tip of finger  Medical Decision Making  Medically screening exam initiated at 4:46 AM.  Appropriate orders placed.  [name] was informed that the remainder of the evaluation will be completed by another provider, this initial triage assessment does not replace that evaluation, and the importance of remaining in the ED until their evaluation is complete.     Tacy Learn, PA-C 01/08/21 0446    Maudie Flakes, MD 01/08/21 [phone]

## 2021-01-08 NOTE — ED Triage Notes (Signed)
Pt arrives POV for eval of L hand pain x 3-4 days. L thumb amputation d/t osteo last year, reports this pain is similar. Worst in her L pointer finger. Denies known injury or trauma.

## 2021-01-08 NOTE — ED Notes (Signed)
Pt name called multiple times for vitals, response

## 2021-01-09 ENCOUNTER — Observation Stay (HOSPITAL_COMMUNITY): Payer: Medicare Other | Admitting: Certified Registered"

## 2021-01-09 ENCOUNTER — Inpatient Hospital Stay (HOSPITAL_BASED_OUTPATIENT_CLINIC_OR_DEPARTMENT_OTHER)
Admission: EM | Admit: 2021-01-09 | Discharge: 2021-01-12 | DRG: 988 | Disposition: A | Discharge status: Home / Self Care | Payer: Medicare Other | Attending: Family Medicine | Admitting: Family Medicine

## 2021-01-09 ENCOUNTER — Encounter (HOSPITAL_BASED_OUTPATIENT_CLINIC_OR_DEPARTMENT_OTHER): Payer: Self-pay | Admitting: Emergency Medicine

## 2021-01-09 ENCOUNTER — Other Ambulatory Visit: Payer: Self-pay

## 2021-01-09 ENCOUNTER — Encounter (HOSPITAL_COMMUNITY)
Admission: EM | Disposition: A | Discharge status: Home / Self Care | Payer: Self-pay | Source: Home / Self Care | Attending: Family Medicine

## 2021-01-09 DIAGNOSIS — Z6837 Body mass index (BMI) 37.0-37.9, adult: Secondary | ICD-10-CM | POA: Diagnosis not present

## 2021-01-09 DIAGNOSIS — E1159 Type 2 diabetes mellitus with other circulatory complications: Secondary | ICD-10-CM | POA: Diagnosis present

## 2021-01-09 DIAGNOSIS — N186 End stage renal disease: Secondary | ICD-10-CM | POA: Diagnosis present

## 2021-01-09 DIAGNOSIS — Z885 Allergy status to narcotic agent status: Secondary | ICD-10-CM

## 2021-01-09 DIAGNOSIS — R918 Other nonspecific abnormal finding of lung field: Secondary | ICD-10-CM | POA: Diagnosis present

## 2021-01-09 DIAGNOSIS — L03012 Cellulitis of left finger: Secondary | ICD-10-CM | POA: Diagnosis present

## 2021-01-09 DIAGNOSIS — I2510 Atherosclerotic heart disease of native coronary artery without angina pectoris: Secondary | ICD-10-CM | POA: Diagnosis present

## 2021-01-09 DIAGNOSIS — D631 Anemia in chronic kidney disease: Secondary | ICD-10-CM | POA: Diagnosis not present

## 2021-01-09 DIAGNOSIS — Z20822 Contact with and (suspected) exposure to covid-19: Secondary | ICD-10-CM | POA: Diagnosis not present

## 2021-01-09 DIAGNOSIS — E1169 Type 2 diabetes mellitus with other specified complication: Secondary | ICD-10-CM | POA: Diagnosis not present

## 2021-01-09 DIAGNOSIS — G2581 Restless legs syndrome: Secondary | ICD-10-CM | POA: Diagnosis present

## 2021-01-09 DIAGNOSIS — N2581 Secondary hyperparathyroidism of renal origin: Secondary | ICD-10-CM | POA: Diagnosis present

## 2021-01-09 DIAGNOSIS — I152 Hypertension secondary to endocrine disorders: Secondary | ICD-10-CM | POA: Diagnosis present

## 2021-01-09 DIAGNOSIS — I83009 Varicose veins of unspecified lower extremity with ulcer of unspecified site: Secondary | ICD-10-CM | POA: Diagnosis not present

## 2021-01-09 DIAGNOSIS — L97929 Non-pressure chronic ulcer of unspecified part of left lower leg with unspecified severity: Secondary | ICD-10-CM | POA: Diagnosis present

## 2021-01-09 DIAGNOSIS — I509 Heart failure, unspecified: Secondary | ICD-10-CM | POA: Diagnosis present

## 2021-01-09 DIAGNOSIS — M069 Rheumatoid arthritis, unspecified: Secondary | ICD-10-CM | POA: Diagnosis not present

## 2021-01-09 DIAGNOSIS — J9611 Chronic respiratory failure with hypoxia: Secondary | ICD-10-CM | POA: Diagnosis not present

## 2021-01-09 DIAGNOSIS — I7121 Aneurysm of the ascending aorta, without rupture: Secondary | ICD-10-CM | POA: Diagnosis present

## 2021-01-09 DIAGNOSIS — Z823 Family history of stroke: Secondary | ICD-10-CM

## 2021-01-09 DIAGNOSIS — Z809 Family history of malignant neoplasm, unspecified: Secondary | ICD-10-CM

## 2021-01-09 DIAGNOSIS — D84821 Immunodeficiency due to drugs: Secondary | ICD-10-CM | POA: Diagnosis present

## 2021-01-09 DIAGNOSIS — I5189 Other ill-defined heart diseases: Secondary | ICD-10-CM

## 2021-01-09 DIAGNOSIS — R911 Solitary pulmonary nodule: Secondary | ICD-10-CM | POA: Diagnosis present

## 2021-01-09 DIAGNOSIS — Z720 Tobacco use: Secondary | ICD-10-CM | POA: Diagnosis present

## 2021-01-09 DIAGNOSIS — L089 Local infection of the skin and subcutaneous tissue, unspecified: Secondary | ICD-10-CM | POA: Diagnosis present

## 2021-01-09 DIAGNOSIS — M869 Osteomyelitis, unspecified: Secondary | ICD-10-CM | POA: Diagnosis present

## 2021-01-09 DIAGNOSIS — Z8249 Family history of ischemic heart disease and other diseases of the circulatory system: Secondary | ICD-10-CM

## 2021-01-09 DIAGNOSIS — Z9115 Patient's noncompliance with renal dialysis: Secondary | ICD-10-CM | POA: Diagnosis not present

## 2021-01-09 DIAGNOSIS — F17210 Nicotine dependence, cigarettes, uncomplicated: Secondary | ICD-10-CM | POA: Diagnosis present

## 2021-01-09 DIAGNOSIS — G4733 Obstructive sleep apnea (adult) (pediatric): Secondary | ICD-10-CM | POA: Diagnosis present

## 2021-01-09 DIAGNOSIS — Z83438 Family history of other disorder of lipoprotein metabolism and other lipidemia: Secondary | ICD-10-CM

## 2021-01-09 DIAGNOSIS — G8929 Other chronic pain: Secondary | ICD-10-CM | POA: Diagnosis present

## 2021-01-09 DIAGNOSIS — Z794 Long term (current) use of insulin: Secondary | ICD-10-CM

## 2021-01-09 DIAGNOSIS — F988 Other specified behavioral and emotional disorders with onset usually occurring in childhood and adolescence: Secondary | ICD-10-CM | POA: Diagnosis present

## 2021-01-09 DIAGNOSIS — Z888 Allergy status to other drugs, medicaments and biological substances status: Secondary | ICD-10-CM

## 2021-01-09 DIAGNOSIS — Z992 Dependence on renal dialysis: Secondary | ICD-10-CM

## 2021-01-09 DIAGNOSIS — N2889 Other specified disorders of kidney and ureter: Secondary | ICD-10-CM | POA: Diagnosis present

## 2021-01-09 DIAGNOSIS — Z8616 Personal history of COVID-19: Secondary | ICD-10-CM

## 2021-01-09 DIAGNOSIS — G894 Chronic pain syndrome: Secondary | ICD-10-CM | POA: Diagnosis present

## 2021-01-09 DIAGNOSIS — E1152 Type 2 diabetes mellitus with diabetic peripheral angiopathy with gangrene: Secondary | ICD-10-CM | POA: Diagnosis present

## 2021-01-09 DIAGNOSIS — Z833 Family history of diabetes mellitus: Secondary | ICD-10-CM

## 2021-01-09 DIAGNOSIS — D259 Leiomyoma of uterus, unspecified: Secondary | ICD-10-CM | POA: Diagnosis present

## 2021-01-09 DIAGNOSIS — Z9842 Cataract extraction status, left eye: Secondary | ICD-10-CM

## 2021-01-09 DIAGNOSIS — E7800 Pure hypercholesterolemia, unspecified: Secondary | ICD-10-CM | POA: Diagnosis present

## 2021-01-09 DIAGNOSIS — E669 Obesity, unspecified: Secondary | ICD-10-CM

## 2021-01-09 DIAGNOSIS — Z9841 Cataract extraction status, right eye: Secondary | ICD-10-CM

## 2021-01-09 DIAGNOSIS — Z89012 Acquired absence of left thumb: Secondary | ICD-10-CM

## 2021-01-09 DIAGNOSIS — Z79899 Other long term (current) drug therapy: Secondary | ICD-10-CM

## 2021-01-09 DIAGNOSIS — E1122 Type 2 diabetes mellitus with diabetic chronic kidney disease: Secondary | ICD-10-CM | POA: Diagnosis not present

## 2021-01-09 DIAGNOSIS — Z87892 Personal history of anaphylaxis: Secondary | ICD-10-CM

## 2021-01-09 HISTORY — PX: I & D EXTREMITY: SHX5045

## 2021-01-09 LAB — CBC WITH DIFFERENTIAL/PLATELET
Abs Immature Granulocytes: 0.03 10*3/uL (ref 0.00–0.07)
Basophils Absolute: 0 10*3/uL (ref 0.0–0.1)
Basophils Relative: 1 %
Eosinophils Absolute: 0.4 10*3/uL (ref 0.0–0.5)
Eosinophils Relative: 6 %
HCT: 29.7 % — ABNORMAL LOW (ref 36.0–46.0)
Hemoglobin: 9.4 g/dL — ABNORMAL LOW (ref 12.0–15.0)
Immature Granulocytes: 1 %
Lymphocytes Relative: 19 %
Lymphs Abs: 1.2 10*3/uL (ref 0.7–4.0)
MCH: 30.6 pg (ref 26.0–34.0)
MCHC: 31.6 g/dL (ref 30.0–36.0)
MCV: 96.7 fL (ref 80.0–100.0)
Monocytes Absolute: 0.4 10*3/uL (ref 0.1–1.0)
Monocytes Relative: 5 %
Neutro Abs: 4.6 10*3/uL (ref 1.7–7.7)
Neutrophils Relative %: 68 %
Platelets: 169 10*3/uL (ref 150–400)
RBC: 3.07 MIL/uL — ABNORMAL LOW (ref 3.87–5.11)
RDW: 15.2 % (ref 11.5–15.5)
WBC: 6.6 10*3/uL (ref 4.0–10.5)
nRBC: 0 % (ref 0.0–0.2)

## 2021-01-09 LAB — GLUCOSE, CAPILLARY: Glucose-Capillary: 132 mg/dL — ABNORMAL HIGH (ref 70–99)

## 2021-01-09 LAB — BASIC METABOLIC PANEL
Anion gap: 13 (ref 5–15)
BUN: 51 mg/dL — ABNORMAL HIGH (ref 6–20)
CO2: 28 mmol/L (ref 22–32)
Calcium: 9.1 mg/dL (ref 8.9–10.3)
Chloride: 92 mmol/L — ABNORMAL LOW (ref 98–111)
Creatinine, Ser: 7.29 mg/dL — ABNORMAL HIGH (ref 0.44–1.00)
GFR, Estimated: 6 mL/min — ABNORMAL LOW (ref >60)
Glucose, Bld: 283 mg/dL — ABNORMAL HIGH (ref 70–99)
Potassium: 4.9 mmol/L (ref 3.5–5.1)
Sodium: 133 mmol/L — ABNORMAL LOW (ref 135–145)

## 2021-01-09 LAB — RESP PANEL BY RT-PCR (FLU A&B, COVID) ARPGX2
Influenza A by PCR: NEGATIVE
Influenza B by PCR: NEGATIVE
SARS Coronavirus 2 by RT PCR: NEGATIVE

## 2021-01-09 LAB — LACTIC ACID, PLASMA: Lactic Acid, Venous: 1.5 mmol/L (ref 0.5–1.9)

## 2021-01-09 SURGERY — IRRIGATION AND DEBRIDEMENT EXTREMITY
Anesthesia: General | Site: Finger | Laterality: Left

## 2021-01-09 MED ORDER — SODIUM CHLORIDE 0.9 % IR SOLN
Status: DC | PRN
  Administered 2021-01-09: 3000 mL

## 2021-01-09 MED ORDER — SUGAMMADEX SODIUM 200 MG/2ML IV SOLN
INTRAVENOUS | Status: DC | PRN
  Administered 2021-01-09: 200 mg via INTRAVENOUS

## 2021-01-09 MED ORDER — VASOPRESSIN 20 UNIT/ML IV SOLN
INTRAVENOUS | Status: AC
  Filled 2021-01-09: qty 1

## 2021-01-09 MED ORDER — MIDAZOLAM HCL 2 MG/2ML IJ SOLN
INTRAMUSCULAR | Status: AC
  Filled 2021-01-09: qty 2

## 2021-01-09 MED ORDER — VANCOMYCIN HCL 1500 MG/300ML IV SOLN
1500.0000 mg | Freq: Once | INTRAVENOUS | Status: DC
  Filled 2021-01-09: qty 300

## 2021-01-09 MED ORDER — ROCURONIUM BROMIDE 100 MG/10ML IV SOLN
INTRAVENOUS | Status: DC | PRN
  Administered 2021-01-09: 30 mg via INTRAVENOUS

## 2021-01-09 MED ORDER — METRONIDAZOLE 500 MG/100ML IV SOLN: 500.0000 mg | Freq: Once | INTRAVENOUS | Status: DC

## 2021-01-09 MED ORDER — MIDAZOLAM HCL 5 MG/5ML IJ SOLN
INTRAMUSCULAR | Status: DC | PRN
  Administered 2021-01-09 (×2): 1 mg via INTRAVENOUS

## 2021-01-09 MED ORDER — SODIUM CHLORIDE 0.9 % IV SOLN
1.0000 g | INTRAVENOUS | Status: DC
  Administered 2021-01-10 – 2021-01-11 (×2): 1 g via INTRAVENOUS
  Filled 2021-01-09 (×4): qty 1

## 2021-01-09 MED ORDER — FENTANYL CITRATE (PF) 100 MCG/2ML IJ SOLN
25.0000 ug | INTRAMUSCULAR | Status: DC | PRN
Start: 2021-01-09 — End: 2021-01-10

## 2021-01-09 MED ORDER — PROPOFOL 10 MG/ML IV BOLUS
INTRAVENOUS | Status: DC | PRN
  Administered 2021-01-09: 80 mg via INTRAVENOUS

## 2021-01-09 MED ORDER — VANCOMYCIN HCL IN DEXTROSE 1-5 GM/200ML-% IV SOLN
1000.0000 mg | Freq: Once | INTRAVENOUS | Status: AC
  Administered 2021-01-09: 1000 mg via INTRAVENOUS
  Filled 2021-01-09: qty 200

## 2021-01-09 MED ORDER — VANCOMYCIN HCL IN DEXTROSE 1-5 GM/200ML-% IV SOLN: 1000.0000 mg | Freq: Once | INTRAVENOUS | Status: DC

## 2021-01-09 MED ORDER — PROPOFOL 10 MG/ML IV BOLUS
INTRAVENOUS | Status: AC
  Filled 2021-01-09: qty 20

## 2021-01-09 MED ORDER — FENTANYL CITRATE (PF) 250 MCG/5ML IJ SOLN
INTRAMUSCULAR | Status: DC | PRN
  Administered 2021-01-09: 50 ug via INTRAVENOUS

## 2021-01-09 MED ORDER — BUPIVACAINE HCL (PF) 0.25 % IJ SOLN
INTRAMUSCULAR | Status: AC
  Filled 2021-01-09: qty 30

## 2021-01-09 MED ORDER — 0.9 % SODIUM CHLORIDE (POUR BTL) OPTIME
TOPICAL | Status: DC | PRN
  Administered 2021-01-09: 1000 mL

## 2021-01-09 MED ORDER — ONDANSETRON HCL 4 MG/2ML IJ SOLN
INTRAMUSCULAR | Status: DC | PRN
  Administered 2021-01-09: 4 mg via INTRAVENOUS

## 2021-01-09 MED ORDER — SODIUM CHLORIDE 0.9 % IV SOLN
2.0000 g | Freq: Once | INTRAVENOUS | Status: AC
  Administered 2021-01-09: 2 g via INTRAVENOUS
  Filled 2021-01-09: qty 2

## 2021-01-09 MED ORDER — VANCOMYCIN HCL IN DEXTROSE 1-5 GM/200ML-% IV SOLN
1000.0000 mg | INTRAVENOUS | Status: DC
  Filled 2021-01-09: qty 200

## 2021-01-09 MED ORDER — PHENYLEPHRINE HCL (PRESSORS) 10 MG/ML IV SOLN
INTRAVENOUS | Status: DC | PRN
  Administered 2021-01-09: 40 ug via INTRAVENOUS
  Administered 2021-01-09: 80 ug via INTRAVENOUS
  Administered 2021-01-09: 40 ug via INTRAVENOUS

## 2021-01-09 MED ORDER — BUPIVACAINE HCL (PF) 0.25 % IJ SOLN
INTRAMUSCULAR | Status: DC | PRN
  Administered 2021-01-09: 10 mL

## 2021-01-09 MED ORDER — SUCCINYLCHOLINE CHLORIDE 200 MG/10ML IV SOSY
PREFILLED_SYRINGE | INTRAVENOUS | Status: DC | PRN
  Administered 2021-01-09: 100 mg via INTRAVENOUS

## 2021-01-09 MED ORDER — VANCOMYCIN HCL 500 MG/100ML IV SOLN
500.0000 mg | Freq: Once | INTRAVENOUS | Status: DC
  Filled 2021-01-09: qty 100

## 2021-01-09 MED ORDER — FENTANYL CITRATE PF 50 MCG/ML IJ SOSY
50.0000 ug | PREFILLED_SYRINGE | Freq: Once | INTRAMUSCULAR | Status: AC
  Administered 2021-01-09: 50 ug via INTRAVENOUS
  Filled 2021-01-09: qty 1

## 2021-01-09 MED ORDER — SODIUM CHLORIDE 0.9 % IV SOLN: INTRAVENOUS | Status: DC | PRN

## 2021-01-09 MED ORDER — FENTANYL CITRATE (PF) 250 MCG/5ML IJ SOLN
INTRAMUSCULAR | Status: AC
  Filled 2021-01-09: qty 5

## 2021-01-09 MED ORDER — VASOPRESSIN 20 UNIT/ML IV SOLN
INTRAVENOUS | Status: DC | PRN
  Administered 2021-01-09 (×2): 1 [IU] via INTRAVENOUS

## 2021-01-09 MED ORDER — NICOTINE 7 MG/24HR TD PT24
7.0000 mg | MEDICATED_PATCH | Freq: Every day | TRANSDERMAL | Status: DC
  Administered 2021-01-10 – 2021-01-11 (×2): 7 mg via TRANSDERMAL
  Filled 2021-01-09 (×3): qty 1

## 2021-01-09 MED ORDER — LIDOCAINE HCL (CARDIAC) PF 100 MG/5ML IV SOSY
PREFILLED_SYRINGE | INTRAVENOUS | Status: DC | PRN
  Administered 2021-01-09: 60 mg via INTRATRACHEAL

## 2021-01-09 SURGICAL SUPPLY — 59 items
ADAPTER CATH SYR TO TUBING 38M (ADAPTER) ×1 IMPLANT
ADPR CATH LL SYR 3/32 TPR (ADAPTER)
BAG COUNTER SPONGE SURGICOUNT (BAG) ×1 IMPLANT
BAG SPNG CNTER NS LX DISP (BAG)
BNDG CMPR 9X4 STRL LF SNTH (GAUZE/BANDAGES/DRESSINGS) ×1
BNDG COHESIVE 1X5 TAN STRL LF (GAUZE/BANDAGES/DRESSINGS) ×1 IMPLANT
BNDG COHESIVE 2X5 TAN STRL LF (GAUZE/BANDAGES/DRESSINGS) IMPLANT
BNDG ELASTIC 3X5.8 VLCR STR LF (GAUZE/BANDAGES/DRESSINGS) ×1 IMPLANT
BNDG ELASTIC 4X5.8 VLCR STR LF (GAUZE/BANDAGES/DRESSINGS) ×2 IMPLANT
BNDG ESMARK 4X9 LF (GAUZE/BANDAGES/DRESSINGS) ×1 IMPLANT
BNDG GAUZE ELAST 4 BULKY (GAUZE/BANDAGES/DRESSINGS) ×1 IMPLANT
CANNULA VESSEL 3MM 2 BLNT TIP (CANNULA) IMPLANT
CORD BIPOLAR FORCEPS 12FT (ELECTRODE) ×2 IMPLANT
COVER SURGICAL LIGHT HANDLE (MISCELLANEOUS) ×2 IMPLANT
CUFF TOURN SGL QUICK 18X4 (TOURNIQUET CUFF) IMPLANT
CUFF TOURN SGL QUICK 24 (TOURNIQUET CUFF)
CUFF TRNQT CYL 24X4X16.5-23 (TOURNIQUET CUFF) IMPLANT
DECANTER SPIKE VIAL GLASS SM (MISCELLANEOUS) ×2 IMPLANT
DRAIN PENROSE 1/4X12 LTX STRL (WOUND CARE) IMPLANT
DRSG PAD ABDOMINAL 8X10 ST (GAUZE/BANDAGES/DRESSINGS) ×4 IMPLANT
GAUZE PACKING IODOFORM 1/4X15 (PACKING) ×1 IMPLANT
GAUZE SPONGE 4X4 12PLY STRL (GAUZE/BANDAGES/DRESSINGS) ×2 IMPLANT
GAUZE SPONGE 4X4 12PLY STRL LF (GAUZE/BANDAGES/DRESSINGS) ×1 IMPLANT
GAUZE XEROFORM 1X8 LF (GAUZE/BANDAGES/DRESSINGS) ×2 IMPLANT
GLOVE SRG 8 PF TXTR STRL LF DI (GLOVE) ×1 IMPLANT
GLOVE SURG ENC MOIS LTX SZ7.5 (GLOVE) ×2 IMPLANT
GLOVE SURG ORTHO LTX SZ8 (GLOVE) IMPLANT
GLOVE SURG POLY ORTHO LF SZ8 (GLOVE) IMPLANT
GLOVE SURG UNDER POLY LF SZ8 (GLOVE) ×2
GLOVE SURG UNDER POLY LF SZ8.5 (GLOVE) IMPLANT
GOWN STRL REUS W/ TWL LRG LVL3 (GOWN DISPOSABLE) ×1 IMPLANT
GOWN STRL REUS W/ TWL XL LVL3 (GOWN DISPOSABLE) ×1 IMPLANT
GOWN STRL REUS W/TWL LRG LVL3 (GOWN DISPOSABLE) ×2
GOWN STRL REUS W/TWL XL LVL3 (GOWN DISPOSABLE) ×2
KIT BASIN OR (CUSTOM PROCEDURE TRAY) ×2 IMPLANT
KIT TURNOVER KIT B (KITS) ×2 IMPLANT
LOOP VESSEL MAXI BLUE (MISCELLANEOUS) IMPLANT
MANIFOLD NEPTUNE II (INSTRUMENTS) IMPLANT
NDL HYPO 25X1 1.5 SAFETY (NEEDLE) IMPLANT
NEEDLE HYPO 25X1 1.5 SAFETY (NEEDLE) ×2 IMPLANT
NS IRRIG 1000ML POUR BTL (IV SOLUTION) ×2 IMPLANT
PACK ORTHO EXTREMITY (CUSTOM PROCEDURE TRAY) ×2 IMPLANT
PAD ARMBOARD 7.5X6 YLW CONV (MISCELLANEOUS) ×4 IMPLANT
SET CYSTO W/LG BORE CLAMP LF (SET/KITS/TRAYS/PACK) IMPLANT
SOL PREP POV-IOD 4OZ 10% (MISCELLANEOUS) ×4 IMPLANT
SPLINT FINGER 5.25 W/BULB ALUM (SOFTGOODS) ×1 IMPLANT
SPONGE T-LAP 4X18 ~~LOC~~+RFID (SPONGE) ×2 IMPLANT
SUT ETHILON 4 0 P 3 18 (SUTURE) IMPLANT
SUT ETHILON 4 0 PS 2 18 (SUTURE) IMPLANT
SUT MON AB 5-0 P3 18 (SUTURE) IMPLANT
SWAB COLLECTION DEVICE MRSA (MISCELLANEOUS) IMPLANT
SWAB CULTURE ESWAB REG 1ML (MISCELLANEOUS) ×1 IMPLANT
SYR 20ML LL LF (SYRINGE) ×1 IMPLANT
SYR CONTROL 10ML LL (SYRINGE) ×1 IMPLANT
TOWEL GREEN STERILE (TOWEL DISPOSABLE) ×2 IMPLANT
TUBE CONNECTING 12X1/4 (SUCTIONS) ×2 IMPLANT
TUBE FEEDING ENTERAL 5FR 16IN (TUBING) IMPLANT
UNDERPAD 30X36 HEAVY ABSORB (UNDERPADS AND DIAPERS) ×2 IMPLANT
YANKAUER SUCT BULB TIP NO VENT (SUCTIONS) ×2 IMPLANT

## 2021-01-09 NOTE — H&P (Addendum)
[facility] Admission History and Physical Service Pager: [phone]  Patient name: [name] Medical record number: [id number] Date of birth: 06/15/1965 Age: 55 y.o. Gender: female  Primary Care Provider: Sandi Mariscal, MD Consultants: Orthopedic Surgery Code Status: Full Preferred Emergency Contact: Jerre Simon (sister) [phone]  Chief Complaint: L Index Finger Pain   Assessment and Plan: [name] is a 55 y.o. female presenting with 10 days of L index finger pain and swelling, now with purulent drainage and X-Ray findings concerning for osteomyelitis. PMH is significant for osteomyelitis of L thumb s/p amputation, ESRD on HD MWF, anemia T2DM, CAD, CHF, HLD, HTN, PVD, OSA.   Osteomyelitis of L Index Finger Patient presenting with 10 days of pain, swelling, and purulent drainage from L index finger. Vital signs stable. Afebrile. Was seen in the ER yesterday for same complaint and found to have lactic acidosis to 2.5 and CRP to 9.3 but left before workup completed. Admission labs today remarkable only for Hgb 9.4, Glucose 283, Cr 7.29 and BUN 51, consistent with known ESRD. Blood cx obtained. X-Ray with findings "highly concerning for acute osteomyelitis of the left second digital phalanx." Patient was evaluated by orthopedic surgery who recommend incision and drainage in the OR. Patient desires to try to save finger if possible.  - Admit to med-surg, Dr. McDiarmid attending - OR with ortho tonight at 2145 - Currently receiving vanc, cefepime, and flagyl--defer to ortho for post-operative abx - OT to evaluate after surgery - Follow blood cx - Avoid dilaudid for pain management, history of apnea with dilaudid  ESRD on HD MWF Last dialyzed on Wednesday. No evidence of uremia or significant volume overload at this time. - Will call nephro in the morning to make them aware she is here, will likely require in-hospital dialysis  T2DM Random  glucose 283. Home regimen is Humalog 75/25 35u BID with meals - A1c in the morning - Continue home Humalog - CBG monitoring with meals and bedtime  Anemia, chronic, likely 2/2 ESRD Pre-operative Hgb 9.4. - Monitor CBC - Continue home Auryxia   Tobacco Use Disorder Currently smokes 5-6 cigarettes daily, down from 1 ppd.  - Nicotine patch while in hospital   HTN BPs 150s-160s/60s-80s. Not on any home agents.  HLD  PVD - Continue home Crestor  FEN/GI: NPO prior to surgery, then carb-modified diet Prophylaxis: Hold off on heparin until after surgery  Disposition: Med-Surg  History of Present Illness:  [name] is a 55 y.o. female presenting with 10 days of pain and swelling of the distal aspect of her L second finger. She decided to seek care today due to "bleeding green pus" from under the nail. Denies any fever/chills. No other sites of pain/infection aside from a chronic venous stasis ulcer of the LLE. She endorses feeling very tired but attributes this to having spent so much time in the ED and not getting any rest. Denies CP/SOB/palpitations.  Dr. Fredna Dow, orthopedic surgery, saw her and would like to operate this evening. He asked that she be admitted to a general medicine team for medical management.   Review Of Systems: Per HPI with the following additions:   Review of Systems  Constitutional:  Negative for chills and fever.  Respiratory:  Negative for chest tightness and shortness of breath.   Cardiovascular:  Positive for leg swelling (chronic). Negative for chest pain and palpitations.  Skin:  Positive for wound.  All other systems reviewed and are negative.  Patient Active Problem List   Diagnosis Date Noted   Osteomyelitis of left hand ([place]) 01/09/2021   Abnormal CT scan, chest 11/27/2020   Encephalopathy acute 11/17/2020   Thyroid nodule 04/10/2020   Acute respiratory failure ([place]) 02/26/2020   Chronic pain 03/20/2019   Preop cardiovascular exam  12/19/2018   Dyslipidemia 11/20/2018   Shortness of breath 10/23/2018   Acute respiratory distress 10/23/2018   Chronic bilateral pleural effusions 10/23/2018   Obesity 10/23/2018   Coronary artery disease 10/23/2018   Tobacco abuse 10/23/2018   Volume overload 10/23/2018   Unspecified protein-calorie malnutrition ([place]) 10/11/2018   Pneumonia 10/10/2018   COVID-19 virus detected 10/10/2018   Acute encephalopathy 10/09/2018   End-stage renal disease on hemodialysis ([place]) 09/27/2018   Acute respiratory failure with hypoxia ([facility]) 09/26/2018   Chronic pain disorder 09/26/2018   Other encephalopathy 09/26/2018   COVID-19 virus infection 99/24/2683   Acute metabolic encephalopathy 41/96/2229   Gangrene ([place]) 09/18/2018   Allergy, unspecified, initial encounter 09/04/2018   Anaphylactic shock, unspecified, initial encounter 09/04/2018   Left arm pain 06/27/2018   Educated about COVID-19 virus infection 06/27/2018   Iron deficiency anemia, unspecified 01/10/2018   Coagulation defect, unspecified ([place]) 12/06/2017   Diarrhea, unspecified 10/11/2017   Anemia 09/26/2017   Left hip pain 09/26/2017   Neck pain 09/26/2017   Leukocytosis 09/26/2017   Pruritus, unspecified 09/22/2017   Secondary hyperparathyroidism of renal origin ([place]) 09/01/2017   Disorder of phosphorus metabolism, unspecified 08/25/2017   Anemia in chronic kidney disease 08/24/2017   Chest pain 07/07/2017   Left ventricular hypertrophy 06/17/2017   ESRD on dialysis ([place]) 05/21/2011   Diabetic retinopathy 79/89/2119   Diastolic dysfunction 41/74/0814   Hyperlipidemia 01/30/2011   Type II diabetes mellitus with complication, uncontrolled ([place]) 01/29/2011   Neuropathy 01/29/2011   Hypertension     Past Medical History: Past Medical History:  Diagnosis Date   Anemia    Anxiety    Arthritis    "knees" "hands", "RA"   CAD (coronary artery disease)    Nonobstructive on CT 2019   CHF (congestive heart failure) ([place])     COVID-19 10/2018   Dyspnea    "when I have too much fluid."   ESRD (end stage renal disease) ([place])    Dialysis [facility]- [address]   Headache(784.0)    Heart murmur    High cholesterol    History of blood transfusion    Hypertension    Mitral regurgitation    moderate to severe MR 10/2018 echo   Nerve pain    "they say I have L5 nerve damage; my lumbar"   Pericardial effusion    Pneumonia    ; 11/16/2018- "touch of pneumonia" - seen in ED- 11/14/2018- on antibiotic. Has had it x 2   PVD (peripheral vascular disease) ([place])    Right leg stent in [place].  (No records)   Restless legs    Sleep apnea    does not use Cpap   Thoracic ascending aortic aneurysm    4.4 cm 11/14/18 CTA   Type II diabetes mellitus [facility])     Past Surgical History: Past Surgical History:  Procedure Laterality Date   AMPUTATION Left 12/27/2018   Procedure: LEFT THUMB REVISION AMPUTATION DIGIT;  Surgeon: Charlotte Crumb, MD;  Location: [place];  Service: Orthopedics;  Laterality: Left;   AV FISTULA PLACEMENT Left    AV FISTULA PLACEMENT Right 03/12/2019   Procedure: INSERTION OF ARTERIOVENOUS (AV) GORE-TEX GRAFT ARM;  Surgeon: Ruta Hinds  E, MD;  Location: [facility] OR;  Service: Vascular;  Laterality: Right;   AV FISTULA PLACEMENT Right 08/13/2019   Procedure: RIGHT UPPER ARM ARTERIOVENOUS (AV) FISTULA CREATION WITH BASILIC VEIN;  Surgeon: Elam Dutch, MD;  Location: [place];  Service: Vascular;  Laterality: Right;   [place]; 1997   x 2   COLONOSCOPY     EYE SURGERY Bilateral    cataract surgery   INSERTION OF DIALYSIS CATHETER Right 09/26/2018   Procedure: INSERTION OF DIALYSIS CATHETER Right Internal Jugular.;  Surgeon: Elam Dutch, MD;  Location: [place];  Service: Vascular;  Laterality: Right;   IR DIALY SHUNT INTRO NEEDLE/INTRACATH INITIAL W/IMG RIGHT Right 11/21/2020   IR FLUORO GUIDE CV LINE RIGHT  05/04/2019   IR US GUIDE VASC ACCESS RIGHT  05/04/2019   IR US GUIDE VASC ACCESS  RIGHT  11/21/2020   LIGATION OF ARTERIOVENOUS  FISTULA Left 09/26/2018   Procedure: LIGATION OF ARTERIOVENOUS  FISTULA LEFT ARM;  Surgeon: Elam Dutch, MD;  Location: [place];  Service: Vascular;  Laterality: Left;   PERICARDIAL WINDOW  02/2004   for pericardial effusion   PERIPHERAL VASCULAR INTERVENTION Right 10/26/2019   Procedure: PERIPHERAL VASCULAR INTERVENTION;  Surgeon: Elam Dutch, MD;  Location: [place] CV LAB;  Service: Cardiovascular;  Laterality: Right;  arm  AV fistula   TUBAL LIGATION  05/1995   UPPER EXTREMITY VENOGRAPHY N/A 06/22/2019   Procedure: UPPER EXTREMITY VENOGRAPHY - Right [place];  Surgeon: Elam Dutch, MD;  Location: [place] CV LAB;  Service: Cardiovascular;  Laterality: N/A;    Social History: Social History   Tobacco Use   Smoking status: Every Day    Years: 33.00    Types: Cigarettes    Last attempt to quit: 07/17/2019    Years since quitting: 1.4   Smokeless tobacco: Never   Tobacco comments:    using NIcotene gum, rare 1/2 cigarette  Vaping Use   Vaping Use: Never used  Substance Use Topics   Alcohol use: No   Drug use: No   Additional social history: Currently smokes 5-6 cigarettes daily, down from 1 ppd  Family History: Family History  Problem Relation Age of Onset   Cancer Brother    Heart disease Father        Died age 58   Diabetes Father    Hyperlipidemia Father    Hypertension Father    Stroke Father    Diabetes Mother    Hypertension Mother    Stroke Mother    Dementia Mother    Diabetes Sister    Diabetes Brother    Hypertension Sister    Hypertension Brother     Allergies and Medications: Allergies  Allergen Reactions   Dilaudid [Hydromorphone] Other (See Comments)    Apnea, required intubation   Bupropion Itching   No current facility-administered medications on file prior to encounter.   Current Outpatient Medications on File Prior to Encounter  Medication Sig Dispense Refill   acetaminophen  (TYLENOL) 500 MG tablet Take 500-1,000 mg by mouth daily as needed for headache (pain).     albuterol (VENTOLIN HFA) 108 (90 Base) MCG/ACT inhaler Inhale 2 puffs into the lungs every 6 (six) hours as needed for wheezing or shortness of breath. (Patient not taking: No sig reported) 6.7 g 0   Ascorbic Acid (VITAMIN C PO) Take 1 tablet by mouth every morning.     AURYXIA 1 GM 210 MG(Fe) tablet Take 840-1,050 mg by  mouth See admin instructions. Take 5 tablets (1050 mg) by mouth twice daily with meals, occasionally take 4 tablets (840 mg) with a snack     b complex-vitamin c-folic acid (NEPHRO-VITE) 0.8 MG TABS tablet Take 1 tablet by mouth every Monday, Wednesday, and Friday with hemodialysis.     glucose blood (ONETOUCH VERIO) test strip 1 each by Other route 2 (two) times daily. And lancets 2/day 200 each 3   HUMIRA PEN 40 MG/0.4ML PNKT Inject 40 mg into the skin every 14 (fourteen) days.     hydrOXYzine (ATARAX/VISTARIL) 25 MG tablet Take 25 mg by mouth every morning.     Insulin Lispro Prot & Lispro (HUMALOG 75/25 MIX) (75-25) 100 UNIT/ML Kwikpen Inject 35 Units into the skin 2 (two) times daily with a meal. (Patient taking differently: Inject 35 Units into the skin See admin instructions.) 45 mL 3   metoprolol succinate (TOPROL-XL) 100 MG 24 hr tablet Take 100 mg by mouth daily. Take with or immediately following a meal.     Multiple Vitamins-Minerals (ZINC PO) Take 1 tablet by mouth every morning.     NARCAN 4 MG/0.1ML LIQD nasal spray kit Place 1 spray into the nose as needed (accidental overdose.).      Omega-3 Fatty Acids (FISH OIL PO) Take 1 capsule by mouth every morning.     OVER THE COUNTER MEDICATION Take 1 capsule by mouth every morning. Omega XL     oxyCODONE-acetaminophen (PERCOCET) 10-325 MG tablet Take 1 tablet by mouth 3 (three) times daily as needed for pain. (Patient not taking: Reported on 11/23/2020)     pramipexole (MIRAPEX) 0.5 MG tablet Take 0.5 mg by mouth daily.   1    rosuvastatin (CRESTOR) 40 MG tablet TAKE 1 TABLET BY MOUTH DAILY *PATIENT NEEDS APPOINTMENT* 30 tablet 10    Objective: BP (!) 162/84 (BP Location: Left Wrist)   Pulse 72   Temp 98.8 F (37.1 C) (Oral)   Resp 18   Ht _0  (1.727 m)   Wt 111.1 kg   LMP  (LMP Unknown) Comment: LMP Feb 2011  SpO2 97%   BMI 37.25 kg/m  Exam: General: Tired appearing, non-toxic, NAD Eyes: Sclerae anicteric, EOMs intact ENTM: MMM Cardiovascular: RRR, distant heart sounds. Distal pulses imperceptible in BLE.  Respiratory: Normal WOB on RA, no wheezes/rales/rhonchi Gastrointestinal: Obese, non-tender MSK: S/p L thumb amputation, L first finger indurated, wound on distal aspect of digit as photographed below, purulent drainage expressed. Distal aspect of the digit is ttp. Range of motion intact.  1+ pitting edema to mid-shin bilaterally Derm: Chronic venous stasis changes to BLE, chronic ulcer without active drainage as photographed below Neuro: A&O, no focal deficits Psych: Mood and affect normal       Labs and Imaging: CBC BMET  Recent Labs  Lab 01/09/21 1628  WBC 6.6  HGB 9.4*  HCT 29.7*  PLT 169   Recent Labs  Lab 01/09/21 1628  NA 133*  K 4.9  CL 92*  CO2 28  BUN 51*  CREATININE 7.29*  GLUCOSE 283*  CALCIUM 9.1     EKG: None obtained  DG Hand Complete Left CLINICAL DATA:  55 year old female with pain and purulent drainage from the left index finger.  EXAM: LEFT HAND - COMPLETE 3+ VIEW  COMPARISON:  No priors.  FINDINGS: Patient is status post amputation of the first digit beyond the base of the proximal phalanx. There is a moth-eaten appearance of the second distal phalanx with irregularity in  the overlying soft tissues and some soft tissue gas, highly concerning for osteomyelitis. Remaining bones of the left hand are otherwise unremarkable in appearance.  IMPRESSION: 1. Findings are highly concerning for acute osteomyelitis of the left second distal phalanx,  as above. 2. Status post amputation of the left first digit beyond the base of the proximal phalanx.  Electronically Signed   By: Vinnie Langton M.D.   On: 01/08/2021 05:18   Eppie Gibson, MD 01/09/2021, 8:31 PM PGY-1, [facility] Intern pager: [phone], text pages welcome  FPTS Upper-Level Resident Addendum   I have independently interviewed and examined the patient. I have discussed the above with Dr. Joelyn Oms and agree with the documented plan. My edits for correction/addition/clarification are included above. Please see any attending notes.   Alcus Dad, MD PGY-2, [facility] 01/09/2021 11:58 PM  [facility] Service pager: [phone] (text pages welcome through [place])

## 2021-01-09 NOTE — ED Triage Notes (Signed)
Pt arrives POV c/o Left 2nd digit pain x 3-4 days. L thumb amputation d/t osteo last year, reports this pain is similar. Worst in her L index finger. Denies known injury or trauma. Swelling and cut noted. Pt left AMA from [facility]

## 2021-01-09 NOTE — ED Notes (Signed)
Report given to [facility]. 

## 2021-01-09 NOTE — Op Note (Addendum)
NAME: [name] RECORD NO: [id number] DATE OF BIRTH: Dec 12, 1965 FACILITY: [facility] LOCATION: [facility] OR PHYSICIAN: Tennis Must, MD   OPERATIVE REPORT   DATE OF PROCEDURE: 01/09/21    PREOPERATIVE DIAGNOSIS: Left index finger infection   POSTOPERATIVE DIAGNOSIS: Left index finger infection and distal necrosis   PROCEDURE: Incision and drainage left index finger felon   SURGEON:  Leanora Cover, M.D.   ASSISTANT: none   ANESTHESIA:  General   INTRAVENOUS FLUIDS:  Per anesthesia flow sheet.   ESTIMATED BLOOD LOSS:  Minimal.   COMPLICATIONS:  None.   SPECIMENS: Cultures to micro   TOURNIQUET TIME:   Left forearm: Approximately 20 minutes at 250 mmHg   DISPOSITION:  Stable to PACU.   INDICATIONS: 55 year old female with end-stage renal disease.  She has noted a swollen and painful left index finger over the past week to week and a half.  She has had some purulent drainage from the tip of the finger.  Radiographs at med center draw bridge showed changes in the distal phalanx consistent with osteomyelitis.  Recommended incision and drainage in the operating room.  Risks, benefits and alternatives of surgery were discussed including the risks of blood loss, infection, damage to nerves, vessels, tendons, ligaments, bone for surgery, need for additional surgery, complications with wound healing, continued pain, stiffness, , need for repeat irrigation and debridement, amputation.  She voiced understanding of these risks and elected to proceed.  OPERATIVE COURSE:  After being identified preoperatively by myself,  the patient and I agreed on the procedure and site of the procedure.  The surgical site was marked.  Surgical consent had been signed. She was given IV antibiotics as preoperative antibiotic prophylaxis. She was transferred to the operating room and placed on the operating table in supine position with the Left upper extremity on an arm board.  General anesthesia was  induced by the anesthesiologist.  Left upper extremity was prepped and draped in normal sterile orthopedic fashion.  A surgical pause was performed between the surgeons, anesthesia, and operating room staff and all were in agreement as to the patient, procedure, and site of procedure.  Tourniquet at the proximal aspect of the forearm was inflated to 250 mmHg after exsanguination of the arm with an Esmarch bandage.  A freer elevator was used to remove the nail.  There was a small amount of purulence under the nail.  The nailbed was dark and without capillary refill.  The thickened skin around the distal aspect of the finger and pad had delaminated from the deeper tissues.  This was removed.  The tip of the finger was blackened.  It was soft and not dried out.  There was a hole into the subcutaneous tissues at the tip of the finger.  An incision was made at the radial side to access the pad.  The tissues were all soft without good turgor.  There was small amount of purulence in the subcutaneous tissues.  Cultures were taken for aerobes and anaerobes.  Some of the necrotic distal tissue was debrided.  The scissors were used to remove necrotic epidermis and some of the necrotic subcutaneous tissues.  The wound was copiously irrigated with sterile saline.  It was packed with quarter inch iodoform gauze.  A piece of Xeroform was placed in the nail fold the wound dressed with sterile Xeroform 4 x 4 and wrapped with a Coban dressing lightly.  An AlumaFoam splint was placed and wrapped lightly with Coban dressing.  The tourniquet was deflated at approximately 20 minutes.  Fingertips were pink with brisk capillary refill after deflation of tourniquet.  The operative  drapes were broken down.  The patient was awoken from anesthesia safely.  She was transferred back to the stretcher and taken to PACU in stable condition.  She is admitted to the hospitalist for antibiotics.   Leanora Cover, MD Electronically signed,  01/09/21  Addendum (01/15/2021): To clarify debridement.

## 2021-01-09 NOTE — ED Triage Notes (Signed)
Arrives to ED via [place] from [facility]. L thumb amputation last year due to osteomyelitis, L index finger pain 3-4 days.  Pain 8:10 132/71 70HR 97% RA AOx4 Currently receiving Vancomycin

## 2021-01-09 NOTE — Anesthesia Preprocedure Evaluation (Addendum)
Anesthesia Evaluation  Patient identified by MRN, date of birth, ID band Patient awake    Reviewed: Allergy & Precautions, H&P , NPO status , Patient's Chart, lab work & pertinent test results, reviewed documented beta blocker date and time   Airway Mallampati: III  TM Distance: >3 FB Neck ROM: Full    Dental no notable dental hx. (+) Teeth Intact, Dental Advisory Given   Pulmonary sleep apnea , Current Smoker and Patient abstained from smoking.,    Pulmonary exam normal breath sounds clear to auscultation       Cardiovascular hypertension, Pt. on medications and Pt. on home beta blockers + Peripheral Vascular Disease and +CHF  + Valvular Problems/Murmurs MR  Rhythm:Regular Rate:Normal     Neuro/Psych  Headaches, Anxiety    GI/Hepatic negative GI ROS, Neg liver ROS,   Endo/Other  diabetes, Insulin DependentMorbid obesity  Renal/GU ESRF and DialysisRenal disease  negative genitourinary   Musculoskeletal  (+) Arthritis , Osteoarthritis,    Abdominal   Peds  Hematology  (+) Blood dyscrasia, anemia ,   Anesthesia Other Findings   Reproductive/Obstetrics negative OB ROS                            Anesthesia Physical Anesthesia Plan  ASA: 3 and emergent  Anesthesia Plan: General   Post-op Pain Management: Ofirmev IV (intra-op)   Induction: Intravenous, Rapid sequence and Cricoid pressure planned  PONV Risk Score and Plan: 3 and Ondansetron, Midazolam and Treatment may vary due to age or medical condition  Airway Management Planned: Oral ETT  Additional Equipment:   Intra-op Plan:   Post-operative Plan: Extubation in OR  Informed Consent: I have reviewed the patients History and Physical, chart, labs and discussed the procedure including the risks, benefits and alternatives for the proposed anesthesia with the patient or authorized representative who has indicated his/her understanding  and acceptance.     Dental advisory given  Plan Discussed with: CRNA  Anesthesia Plan Comments:        Anesthesia Quick Evaluation

## 2021-01-09 NOTE — Anesthesia Procedure Notes (Signed)
Procedure Name: Intubation Date/Time: 01/09/2021 10:53 PM Performed by: Clovis Cao, CRNA Pre-anesthesia Checklist: Patient identified, Emergency Drugs available, Suction available and Patient being monitored Patient Re-evaluated:Patient Re-evaluated prior to induction Oxygen Delivery Method: Circle system utilized Preoxygenation: Pre-oxygenation with 100% oxygen Induction Type: IV induction, Rapid sequence and Cricoid Pressure applied Laryngoscope Size: Miller and 2 Grade View: Grade II Tube type: Oral Tube size: 7.0 mm Number of attempts: 1 Airway Equipment and Method: Stylet Placement Confirmation: ETT inserted through vocal cords under direct vision, positive ETCO2 and breath sounds checked- equal and bilateral Secured at: 22 cm Tube secured with: Tape Dental Injury: Teeth and Oropharynx as per pre-operative assessment

## 2021-01-09 NOTE — ED Provider Notes (Signed)
[place] EMERGENCY DEPT Provider Note   CSN: [id number] Arrival date & time: 01/09/21  1501     History Chief Complaint  Patient presents with   Hand Pain    [name] is a 55 y.o. female.  HPI  55 year old female history of anemia, anxiety, arthritis, CAD, CHF, COVID-19, ESRD on dialysis, murmur hyperlipidemia, hypertension, MR, pericardial effusion, pneumonia, peripheral vascular disease, sleep apnea, thoracic ascending aortic aneurysm, diabetes, who presents the emergency department today for evaluation of left finger pain that started a few days ago.  States she bites her nails and is not sure if this caused her symptoms.  She has had some bleeding and pus drainage from the finger.  She has not had any fevers or any systemic symptoms.  She was seen in the [facility] ED yesterday and in triage and had an x-ray ordered however she ultimately left prior to receiving care.  She does have a history of osteomyelitis to the thumb on the left hand.  Past Medical History:  Diagnosis Date   Anemia    Anxiety    Arthritis    "knees" "hands", "RA"   CAD (coronary artery disease)    Nonobstructive on CT 2019   CHF (congestive heart failure) ([place])    COVID-19 10/2018   Dyspnea    "when I have too much fluid."   ESRD (end stage renal disease) ([place])    Dialysis [facility]- [address]   Headache(784.0)    Heart murmur    High cholesterol    History of blood transfusion    Hypertension    Mitral regurgitation    moderate to severe MR 10/2018 echo   Nerve pain    "they say I have L5 nerve damage; my lumbar"   Pericardial effusion    Pneumonia    ; 11/16/2018- "touch of pneumonia" - seen in ED- 11/14/2018- on antibiotic. Has had it x 2   PVD (peripheral vascular disease) ([place])    Right leg stent in [place].  (No records)   Restless legs    Sleep apnea    does not use Cpap   Thoracic ascending aortic aneurysm    4.4 cm 11/14/18 CTA   Type II diabetes mellitus [facility])      Patient Active Problem List   Diagnosis Date Noted   Abnormal CT scan, chest 11/27/2020   Encephalopathy acute 11/17/2020   Thyroid nodule 04/10/2020   Acute respiratory failure ([place]) 02/26/2020   Chronic pain 03/20/2019   Preop cardiovascular exam 12/19/2018   Dyslipidemia 11/20/2018   Shortness of breath 10/23/2018   Acute respiratory distress 10/23/2018   Chronic bilateral pleural effusions 10/23/2018   Obesity 10/23/2018   Coronary artery disease 10/23/2018   Tobacco abuse 10/23/2018   Volume overload 10/23/2018   Unspecified protein-calorie malnutrition ([place]) 10/11/2018   Pneumonia 10/10/2018   COVID-19 virus detected 10/10/2018   Acute encephalopathy 10/09/2018   End-stage renal disease on hemodialysis ([place]) 09/27/2018   Acute respiratory failure with hypoxia ([place]) 09/26/2018   Chronic pain disorder 09/26/2018   Other encephalopathy 09/26/2018   COVID-19 virus infection 68/01/7516   Acute metabolic encephalopathy 00/17/4944   Gangrene ([place]) 09/18/2018   Allergy, unspecified, initial encounter 09/04/2018   Anaphylactic shock, unspecified, initial encounter 09/04/2018   Left arm pain 06/27/2018   Educated about COVID-19 virus infection 06/27/2018   Iron deficiency anemia, unspecified 01/10/2018   Coagulation defect, unspecified ([place]) 12/06/2017   Diarrhea, unspecified 10/11/2017   Anemia 09/26/2017   Left hip  pain 09/26/2017   Neck pain 09/26/2017   Leukocytosis 09/26/2017   Pruritus, unspecified 09/22/2017   Secondary hyperparathyroidism of renal origin (HCC) 09/01/2017   Disorder of phosphorus metabolism, unspecified 08/25/2017   Anemia in chronic kidney disease 08/24/2017   Chest pain 07/07/2017   Left ventricular hypertrophy 06/17/2017   ESRD on dialysis [facility]) 05/21/2011   Diabetic retinopathy 05/21/2011   Diastolic dysfunction 05/12/2011   Hyperlipidemia 01/30/2011   Type II diabetes mellitus with complication, uncontrolled (HCC) 01/29/2011   Neuropathy  01/29/2011   Hypertension     Past Surgical History:  Procedure Laterality Date   AMPUTATION Left 12/27/2018   Procedure: LEFT THUMB REVISION AMPUTATION DIGIT;  Surgeon: Dairl Ponder, MD;  Location: [facility] OR;  Service: Orthopedics;  Laterality: Left;   AV FISTULA PLACEMENT Left    AV FISTULA PLACEMENT Right 03/12/2019   Procedure: INSERTION OF ARTERIOVENOUS (AV) GORE-TEX GRAFT ARM;  Surgeon: Sherren Kerns, MD;  Location: [facility] OR;  Service: Vascular;  Laterality: Right;   AV FISTULA PLACEMENT Right 08/13/2019   Procedure: RIGHT UPPER ARM ARTERIOVENOUS (AV) FISTULA CREATION WITH BASILIC VEIN;  Surgeon: Sherren Kerns, MD;  Location: [facility] OR;  Service: Vascular;  Laterality: Right;   CESAREAN SECTION  1996; 1997   x 2   COLONOSCOPY     EYE SURGERY Bilateral    cataract surgery   INSERTION OF DIALYSIS CATHETER Right 09/26/2018   Procedure: INSERTION OF DIALYSIS CATHETER Right Internal Jugular.;  Surgeon: Sherren Kerns, MD;  Location: [facility] OR;  Service: Vascular;  Laterality: Right;   IR DIALY SHUNT INTRO NEEDLE/INTRACATH INITIAL W/IMG RIGHT Right 11/21/2020   IR FLUORO GUIDE CV LINE RIGHT  05/04/2019   IR US GUIDE VASC ACCESS RIGHT  05/04/2019   IR US GUIDE VASC ACCESS RIGHT  11/21/2020   LIGATION OF ARTERIOVENOUS  FISTULA Left 09/26/2018   Procedure: LIGATION OF ARTERIOVENOUS  FISTULA LEFT ARM;  Surgeon: Sherren Kerns, MD;  Location: [facility] OR;  Service: Vascular;  Laterality: Left;   PERICARDIAL WINDOW  02/2004   for pericardial effusion   PERIPHERAL VASCULAR INTERVENTION Right 10/26/2019   Procedure: PERIPHERAL VASCULAR INTERVENTION;  Surgeon: Sherren Kerns, MD;  Location: [facility] INVASIVE CV LAB;  Service: Cardiovascular;  Laterality: Right;  arm  AV fistula   TUBAL LIGATION  05/1995   UPPER EXTREMITY VENOGRAPHY N/A 06/22/2019   Procedure: UPPER EXTREMITY VENOGRAPHY - Right Central;  Surgeon: Sherren Kerns, MD;  Location: [facility] INVASIVE CV LAB;  Service: Cardiovascular;  Laterality: N/A;      OB History   No obstetric history on file.     Family History  Problem Relation Age of Onset   Cancer Brother    Heart disease Father        Died age 21   Diabetes Father    Hyperlipidemia Father    Hypertension Father    Stroke Father    Diabetes Mother    Hypertension Mother    Stroke Mother    Dementia Mother    Diabetes Sister    Diabetes Brother    Hypertension Sister    Hypertension Brother     Social History   Tobacco Use   Smoking status: Every Day    Years: 33.00    Types: Cigarettes    Last attempt to quit: 07/17/2019    Years since quitting: 1.4   Smokeless tobacco: Never   Tobacco comments:    using NIcotene gum, rare 1/2 cigarette  Vaping Use  Vaping Use: Never used  Substance Use Topics   Alcohol use: No   Drug use: No    Home Medications Prior to Admission medications   Medication Sig Start Date End Date Taking? Authorizing Provider  acetaminophen (TYLENOL) 500 MG tablet Take 500-1,000 mg by mouth daily as needed for headache (pain).    [provider]  albuterol (VENTOLIN HFA) 108 (90 Base) MCG/ACT inhaler Inhale 2 puffs into the lungs every 6 (six) hours as needed for wheezing or shortness of breath. Patient not taking: No sig reported 02/22/20   Thurnell Lose, MD  Ascorbic Acid (VITAMIN C PO) Take 1 tablet by mouth every morning.    [provider]  AURYXIA 1 GM 210 MG(Fe) tablet Take 840-1,050 mg by mouth See admin instructions. Take 5 tablets (1050 mg) by mouth twice daily with meals, occasionally take 4 tablets (840 mg) with a snack 05/29/18   [provider]  b complex-vitamin c-folic acid (NEPHRO-VITE) 0.8 MG TABS tablet Take 1 tablet by mouth every Monday, Wednesday, and Friday with hemodialysis. 04/12/19   [provider]  glucose blood (ONETOUCH VERIO) test strip 1 each by Other route 2 (two) times daily. And lancets 2/day 04/10/20   Renato Shin, MD  HUMIRA PEN 40 MG/0.4ML PNKT Inject 40 mg into  the skin every 14 (fourteen) days. 08/22/20   [provider]  hydrOXYzine (ATARAX/VISTARIL) 25 MG tablet Take 25 mg by mouth every morning.    [provider]  Insulin Lispro Prot & Lispro (HUMALOG 75/25 MIX) (75-25) 100 UNIT/ML Kwikpen Inject 35 Units into the skin 2 (two) times daily with a meal. Patient taking differently: Inject 35 Units into the skin See admin instructions. 05/01/20   Renato Shin, MD  metoprolol succinate (TOPROL-XL) 100 MG 24 hr tablet Take 100 mg by mouth daily. Take with or immediately following a meal.    [provider]  Multiple Vitamins-Minerals (ZINC PO) Take 1 tablet by mouth every morning.    [provider]  NARCAN 4 MG/0.1ML LIQD nasal spray kit Place 1 spray into the nose as needed (accidental overdose.).  10/10/19   [provider]  Omega-3 Fatty Acids (FISH OIL PO) Take 1 capsule by mouth every morning.    [provider]  OVER THE COUNTER MEDICATION Take 1 capsule by mouth every morning. Omega XL    [provider]  oxyCODONE-acetaminophen (PERCOCET) 10-325 MG tablet Take 1 tablet by mouth 3 (three) times daily as needed for pain. Patient not taking: Reported on 11/23/2020 11/04/20   [provider]  pramipexole (MIRAPEX) 0.5 MG tablet Take 0.5 mg by mouth daily.  05/18/17   [provider]  rosuvastatin (CRESTOR) 40 MG tablet TAKE 1 TABLET BY MOUTH DAILY *PATIENT NEEDS APPOINTMENT* 12/08/20   Minus Breeding, MD    Allergies    Bupropion  Review of Systems   Review of Systems  Constitutional:  Negative for chills and fever.  HENT:  Negative for ear pain and sore throat.   Eyes:  Negative for visual disturbance.  Respiratory:  Negative for cough and shortness of breath.   Cardiovascular:  Negative for chest pain.  Gastrointestinal:  Negative for abdominal pain, constipation, diarrhea, nausea and vomiting.  Genitourinary:  Negative for dysuria and hematuria.  Musculoskeletal:   Negative for back pain.       Left index finger pain  Skin:  Positive for color change and wound.  Neurological:  Negative for headaches.  All other systems  reviewed and are negative.  Physical Exam Updated Vital Signs BP (!) 162/84 (BP Location: Left Wrist)   Pulse 72   Temp 98.8 F (37.1 C) (Oral)   Resp 18   Ht _0  (1.727 m)   Wt 111.1 kg   LMP  (LMP Unknown) Comment: LMP Feb 2011  SpO2 97%   BMI 37.25 kg/m   Physical Exam Vitals and nursing note reviewed.  Constitutional:      General: She is not in acute distress.    Appearance: She is well-developed.  HENT:     Head: Normocephalic and atraumatic.  Eyes:     Conjunctiva/sclera: Conjunctivae normal.  Cardiovascular:     Rate and Rhythm: Normal rate.  Pulmonary:     Effort: Pulmonary effort is normal.  Abdominal:     Palpations: Abdomen is soft.  Musculoskeletal:     Cervical back: Neck supple.     Comments: TTP and discoloration noted to the tip of the left index finger. Appears to have purulence beneath the right fingernail and to the pad of the index finger. Scant amount of bleeding noted.   Skin:    General: Skin is warm and dry.     Capillary Refill: Capillary refill takes less than 2 seconds.  Neurological:     Mental Status: She is alert.  Psychiatric:        Mood and Affect: Mood normal.          ED Results / Procedures / Treatments   Labs (all labs ordered are listed, but only abnormal results are displayed) Labs Reviewed  CBC WITH DIFFERENTIAL/PLATELET - Abnormal; Notable for the following components:      Result Value   RBC 3.07 (*)    Hemoglobin 9.4 (*)    HCT 29.7 (*)    All other components within normal limits  BASIC METABOLIC PANEL - Abnormal; Notable for the following components:   Sodium 133 (*)    Chloride 92 (*)    Glucose, Bld 283 (*)    BUN 51 (*)    Creatinine, Ser 7.29 (*)    GFR, Estimated 6 (*)    All other components within normal limits  RESP PANEL BY RT-PCR (FLU  A&B, COVID) ARPGX2  CULTURE, BLOOD (ROUTINE X 2)  CULTURE, BLOOD (ROUTINE X 2)  LACTIC ACID, PLASMA  LACTIC ACID, PLASMA    EKG None  Radiology DG Hand Complete Left  Result Date: 01/08/2021 CLINICAL DATA:  55 year old female with pain and purulent drainage from the left index finger. EXAM: LEFT HAND - COMPLETE 3+ VIEW COMPARISON:  No priors. FINDINGS: Patient is status post amputation of the first digit beyond the base of the proximal phalanx. There is a moth-eaten appearance of the second distal phalanx with irregularity in the overlying soft tissues and some soft tissue gas, highly concerning for osteomyelitis. Remaining bones of the left hand are otherwise unremarkable in appearance. IMPRESSION: 1. Findings are highly concerning for acute osteomyelitis of the left second distal phalanx, as above. 2. Status post amputation of the left first digit beyond the base of the proximal phalanx. Electronically Signed   By: Vinnie Langton M.D.   On: 01/08/2021 05:18    Procedures Procedures   Medications Ordered in ED Medications  metroNIDAZOLE (FLAGYL) IVPB 500 mg (has no administration in time range)  vancomycin (VANCOCIN) IVPB 1000 mg/200 mL premix (1,000 mg Intravenous New Bag/Given 01/09/21 1740)    Followed by  vancomycin (VANCOCIN) IVPB 1000 mg/200 mL premix (has  no administration in time range)    Followed by  vancomycin (VANCOREADY) IVPB 500 mg/100 mL (has no administration in time range)  vancomycin (VANCOCIN) IVPB 1000 mg/200 mL premix (has no administration in time range)  ceFEPIme (MAXIPIME) 1 g in sodium chloride 0.9 % 100 mL IVPB (has no administration in time range)  ceFEPIme (MAXIPIME) 2 g in sodium chloride 0.9 % 100 mL IVPB (2 g Intravenous New Bag/Given 01/09/21 1634)  fentaNYL (SUBLIMAZE) injection 50 mcg (50 mcg Intravenous Given 01/09/21 1635)    ED Course  I have reviewed the triage vital signs and the nursing notes.  Pertinent labs & imaging results that were  available during my care of the patient were reviewed by me and considered in my medical decision making (see chart for details).    MDM Rules/Calculators/A&P                          55 year old female with a history of diabetes on ESRD presenting for evaluation of left index finger pain that started a few days ago.  Seen in the ED yesterday in triage and had an x-ray ordered which showed findings concerning for osteomyelitis.  She did leave prior to being fully evaluated and treated by provider.  Labs pending at time of transfer  Pt was given pain meds, broad spectrum abx. Will discuss with hand surgery  4:29 PM CONSULT with Dr. Fredna Dow with hand surgery who recommends ED to ED transfer. He will post pt for the OR tonight.  4:33 PM Case discussed with Dr. Laverta [place] who accepts patient for transfer to [facility]   Final Clinical Impression(s) / ED Diagnoses Final diagnoses:  Osteomyelitis of left hand, unspecified type [facility])    Rx / DC Orders ED Discharge Orders     None        Bishop Dublin 01/09/21 Dola Factor, MD 01/09/21 2330

## 2021-01-09 NOTE — Consult Note (Signed)
[name] is an 55 y.o. female.   Consult: Left index finger infection HPI: 55 year old female on dialysis with 1 to 1-1/2 weeks swelling and pain of the left index finger.  She has noted drainage from under the nail that she states is foul-smelling.  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  Seen at [place] where radiographs were taken revealing possible osteomyelitis of distal phalanx left index finger.  Transferred to [facility] for further care.  Allergies:  Allergies  Allergen Reactions   Bupropion Itching    Past Medical History:  Diagnosis Date   Anemia    Anxiety    Arthritis    "knees" "hands", "RA"   CAD (coronary artery disease)    Nonobstructive on CT 2019   CHF (congestive heart failure) ([place])    COVID-19 10/2018   Dyspnea    "when I have too much fluid."   ESRD (end stage renal disease) ([place])    Dialysis [facility]- 3rd st   Headache(784.0)    Heart murmur    High cholesterol    History of blood transfusion    Hypertension    Mitral regurgitation    moderate to severe MR 10/2018 echo   Nerve pain    "they say I have L5 nerve damage; my lumbar"   Pericardial effusion    Pneumonia    ; 11/16/2018- "touch of pneumonia" - seen in ED- 11/14/2018- on antibiotic. Has had it x 2   PVD (peripheral vascular disease) ([place])    Right leg stent in [place].  (No records)   Restless legs    Sleep apnea    does not use Cpap   Thoracic ascending aortic aneurysm    4.4 cm 11/14/18 CTA   Type II diabetes mellitus [facility])     Past Surgical History:  Procedure Laterality Date   AMPUTATION Left 12/27/2018   Procedure: LEFT THUMB REVISION AMPUTATION DIGIT;  Surgeon: Charlotte Crumb, MD;  Location: [place];  Service: Orthopedics;  Laterality: Left;   AV FISTULA PLACEMENT Left    AV FISTULA PLACEMENT Right 03/12/2019   Procedure: INSERTION OF ARTERIOVENOUS (AV) GORE-TEX GRAFT ARM;  Surgeon: Elam Dutch, MD;  Location: [place];  Service: Vascular;  Laterality: Right;   AV FISTULA PLACEMENT Right 08/13/2019   Procedure: RIGHT UPPER ARM ARTERIOVENOUS (AV) FISTULA CREATION WITH BASILIC VEIN;  Surgeon: Elam Dutch, MD;  Location: [place];  Service: Vascular;  Laterality: Right;   [place]; 1997   x 2   COLONOSCOPY     EYE SURGERY Bilateral    cataract surgery   INSERTION OF DIALYSIS CATHETER Right 09/26/2018   Procedure: INSERTION OF DIALYSIS CATHETER Right Internal Jugular.;  Surgeon: Elam Dutch, MD;  Location: [place];  Service: Vascular;  Laterality: Right;   IR DIALY SHUNT INTRO NEEDLE/INTRACATH INITIAL W/IMG RIGHT Right 11/21/2020   IR FLUORO GUIDE CV LINE RIGHT  05/04/2019   IR US GUIDE VASC ACCESS RIGHT  05/04/2019   IR US GUIDE VASC ACCESS RIGHT  11/21/2020   LIGATION OF ARTERIOVENOUS  FISTULA Left 09/26/2018   Procedure: LIGATION OF ARTERIOVENOUS  FISTULA LEFT ARM;  Surgeon: Elam Dutch, MD;  Location: [place];  Service: Vascular;  Laterality: Left;   PERICARDIAL WINDOW  02/2004   for pericardial effusion   PERIPHERAL VASCULAR INTERVENTION Right 10/26/2019   Procedure: PERIPHERAL VASCULAR INTERVENTION;  Surgeon: Elam Dutch, MD;  Location: [place] CV [facility];  Service: Cardiovascular;  Laterality: Right;  arm  AV fistula   TUBAL LIGATION  05/1995   UPPER EXTREMITY VENOGRAPHY N/A 06/22/2019   Procedure: UPPER EXTREMITY VENOGRAPHY - Right Central;  Surgeon: Elam Dutch, MD;  Location: [place] CV [facility];  Service: Cardiovascular;  Laterality: N/A;    Family History: Family History  Problem Relation Age of Onset   Cancer Brother    Heart disease Father        Died age 29   Diabetes Father    Hyperlipidemia Father    Hypertension Father    Stroke Father    Diabetes Mother    Hypertension Mother    Stroke Mother    Dementia Mother    Diabetes Sister    Diabetes Brother    Hypertension Sister    Hypertension Brother     Social History:   reports that she has been  smoking cigarettes. She has never used smokeless tobacco. She reports that she does not drink alcohol and does not use drugs.  Medications: (Not in a hospital admission)   Results for orders placed or performed during the hospital encounter of 01/09/21 (from the past 48 hour(s))  CBC with Differential     Status: Abnormal   Collection Time: 01/09/21  4:28 PM  Result Value Ref Range   WBC 6.6 4.0 - 10.5 K/uL   RBC 3.07 (L) 3.87 - 5.11 MIL/uL   Hemoglobin 9.4 (L) 12.0 - 15.0 g/dL   HCT 29.7 (L) 36.0 - 46.0 %   MCV 96.7 80.0 - 100.0 fL   MCH 30.6 26.0 - 34.0 pg   MCHC 31.6 30.0 - 36.0 g/dL   RDW 15.2 11.5 - 15.5 %   Platelets 169 150 - 400 K/uL   nRBC 0.0 0.0 - 0.2 %   Neutrophils Relative % 68 %   Neutro Abs 4.6 1.7 - 7.7 K/uL   Lymphocytes Relative 19 %   Lymphs Abs 1.2 0.7 - 4.0 K/uL   Monocytes Relative 5 %   Monocytes Absolute 0.4 0.1 - 1.0 K/uL   Eosinophils Relative 6 %   Eosinophils Absolute 0.4 0.0 - 0.5 K/uL   Basophils Relative 1 %   Basophils Absolute 0.0 0.0 - 0.1 K/uL   Immature Granulocytes 1 %   Abs Immature Granulocytes 0.03 0.00 - 0.07 K/uL    Comment: Performed at [facility], [address], [place], [place] [address]  Basic metabolic panel     Status: Abnormal   Collection Time: 01/09/21  4:28 PM  Result Value Ref Range   Sodium 133 (L) 135 - 145 mmol/L   Potassium 4.9 3.5 - 5.1 mmol/L   Chloride 92 (L) 98 - 111 mmol/L   CO2 28 22 - 32 mmol/L   Glucose, Bld 283 (H) 70 - 99 mg/dL    Comment: Glucose reference range applies only to samples taken after fasting for at least 8 hours.   BUN 51 (H) 6 - 20 mg/dL   Creatinine, Ser 7.29 (H) 0.44 - 1.00 mg/dL   Calcium 9.1 8.9 - 10.3 mg/dL   GFR, Estimated 6 (L) >60 mL/min    Comment: (NOTE) Calculated using the CKD-EPI Creatinine Equation (2021)    Anion gap 13 5 - 15    Comment: Performed at [facility], [place], [place] [address]  Lactic acid,  plasma     Status: None   Collection Time: 01/09/21  4:28 PM  Result Value Ref Range  Lactic Acid, Venous 1.5 0.5 - 1.9 mmol/L    Comment: Performed at [facility], [address] [place], [place] [address]  Resp Panel by RT-PCR (Flu A&B, Covid) Nasopharyngeal Swab     Status: None   Collection Time: 01/09/21  4:28 PM   Specimen: Nasopharyngeal Swab; Nasopharyngeal(NP) swabs in vial transport medium  Result Value Ref Range   SARS Coronavirus 2 by RT PCR NEGATIVE NEGATIVE    Comment: (NOTE) SARS-CoV-2 target nucleic acids are NOT DETECTED.  The SARS-CoV-2 RNA is generally detectable in upper respiratory specimens during the acute phase of infection. The lowest concentration of SARS-CoV-2 viral copies this assay can detect is 138 copies/mL. A negative result does not preclude SARS-Cov-2 infection and should not be used as the sole basis for treatment or other patient management decisions. A negative result may occur with  improper specimen collection/handling, submission of specimen other than nasopharyngeal swab, presence of viral mutation(s) within the areas targeted by this assay, and inadequate number of viral copies(<138 copies/mL). A negative result must be combined with clinical observations, patient history, and epidemiological information. The expected result is Negative.  Fact Sheet for Patients:  EntrepreneurPulse.com.au  Fact Sheet for Healthcare Providers:  IncredibleEmployment.be  This test is no t yet approved or cleared by the [place] FDA and  has been authorized for detection and/or diagnosis of SARS-CoV-2 by FDA under an Emergency Use Authorization (EUA). This EUA will remain  in effect (meaning this test can be used) for the duration of the COVID-19 declaration under Section 564(b)(1) of the Act, 21 U.S.C.section 360bbb-3(b)(1), unless the authorization is terminated  or revoked sooner.        Influenza A by PCR NEGATIVE NEGATIVE   Influenza B by PCR NEGATIVE NEGATIVE    Comment: (NOTE) The Xpert Xpress SARS-CoV-2/FLU/RSV plus assay is intended as an aid in the diagnosis of influenza from Nasopharyngeal swab specimens and should not be used as a sole basis for treatment. Nasal washings and aspirates are unacceptable for Xpert Xpress SARS-CoV-2/FLU/RSV testing.  Fact Sheet for Patients: EntrepreneurPulse.com.au  Fact Sheet for Healthcare Providers: IncredibleEmployment.be  This test is not yet approved or cleared by the [place] FDA and has been authorized for detection and/or diagnosis of SARS-CoV-2 by FDA under an Emergency Use Authorization (EUA). This EUA will remain in effect (meaning this test can be used) for the duration of the COVID-19 declaration under Section 564(b)(1) of the Act, 21 U.S.C. section 360bbb-3(b)(1), unless the authorization is terminated or revoked.  Performed at [facility], [address] [place], [place] [address]     DG Hand Complete Left  Result Date: 01/08/2021 CLINICAL DATA:  55 year old female with pain and purulent drainage from the left index finger. EXAM: LEFT HAND - COMPLETE 3+ VIEW COMPARISON:  No priors. FINDINGS: Patient is status post amputation of the first digit beyond the base of the proximal phalanx. There is a moth-eaten appearance of the second distal phalanx with irregularity in the overlying soft tissues and some soft tissue gas, highly concerning for osteomyelitis. Remaining bones of the left hand are otherwise unremarkable in appearance. IMPRESSION: 1. Findings are highly concerning for acute osteomyelitis of the left second distal phalanx, as above. 2. Status post amputation of the left first digit beyond the base of the proximal phalanx. Electronically Signed   By: Vinnie Langton M.D.   On: 01/08/2021 05:18      Blood pressure (!) 162/84, pulse 72,  temperature 98.8 F (37.1 C),  temperature source Oral, resp. rate 18, height 5\' 8"  (1.727 m), weight 111.1 kg, SpO2 97 %.  General appearance: alert, cooperative, and appears stated age Head: Normocephalic, without obvious abnormality, atraumatic Neck: supple, symmetrical, trachea midline Extremities: Intact sensation and capillary refill in the left hand with the exception of the index finger.  Thumb has been amputated.  The wound is healed.  In the index finger the skin over the distal phalanx is hardened.  There is purulent drainage from under the nail.  Tender to palpation in the pad of the distal phalanx and dorsally.  Mild tenderness over the middle phalanx nontender over the proximal phalanx.  Proximal and middle phalanges are not swollen.  She is able to flex the finger without pain.  No proximal streaking. Pulses: 2+ and symmetric Skin: Skin color, texture, turgor normal. No rashes or lesions Neurologic: Grossly normal Incision/Wound: As above  Radiographs: AP lateral oblique views left index finger show decreased bony density of the distal phalanx system with osteomyelitis.  Assessment/Plan Left index finger infection with possible osteomyelitis.  Recommend incision and drainage of left index finger in the OR.  This may include the middle phalanx or possibly flexor tendon sheath as necessary.  We discussed that with end-stage renal disease she may have some ischemia in this finger as well and this can lead to wound healing difficulty possibly requiring amputation of part or all of the digit.  She would like to try to save the finger.  Risks, benefits and alternatives of surgery were discussed including risks of blood loss, infection, damage to nerves/vessels/tendons/ligament/bone, failure of surgery, need for additional surgery, complication with wound healing, stiffness, need for repeat irrigation and debridement, amputation.  She voiced understanding of these risks and elected to proceed.     [name] 01/09/2021, 7:11 PM

## 2021-01-09 NOTE — Transfer of Care (Signed)
Immediate Anesthesia Transfer of Care Note  Patient: [name]  Procedure(s) Performed: IRRIGATION AND DEBRIDEMENT INDEX FINGER (Left: Finger)  Patient Location: PACU  Anesthesia Type:General  Level of Consciousness: drowsy  Airway & Oxygen Therapy: Patient Spontanous Breathing and Patient connected to face mask oxygen  Post-op Assessment: Report given to RN and Post -op Vital signs reviewed and stable  Post vital signs: Reviewed and stable  Last Vitals:  Vitals Value Taken Time  BP 133/74 01/09/21 2327  Temp    Pulse    Resp 24 01/09/21 2327  SpO2    Vitals shown include unvalidated device data.  Last Pain:  Vitals:   01/09/21 1842  TempSrc:   PainSc: 0-No pain         Complications: No notable events documented.

## 2021-01-09 NOTE — Progress Notes (Signed)
Pharmacy Antibiotic Note  [name] is a 55 y.o. female admitted on 01/09/2021 presenting with left digit pain, hx amputation 2/2 osteo.  Pharmacy has been consulted for vancomycin and cefepime dosing.  ESRD-HD usually MWF  Plan: Vancomycin 2500 mg IV x 1, then 1000 mg q HD Cefepime 2g IV x 1, then 1g IV q 24h Monitor HD plan, Cx and clinical progression to narrow Vancomycin random level as needed  Height: 5\' 8"  (172.7 cm) Weight: 111.1 kg (245 lb) IBW/kg (Calculated) : 63.9  Temp (24hrs), Avg:98.4 F (36.9 C), Min:98.4 F (36.9 C), Max:98.4 F (36.9 C)  Recent Labs  Lab 01/08/21 0452  WBC 8.0  CREATININE 4.98*  LATICACIDVEN 2.5*    Estimated Creatinine Clearance: 16.5 mL/min (A) (by C-G formula based on SCr of 4.98 mg/dL (H)).    Allergies  Allergen Reactions   Bupropion Itching    Bertis Ruddy, PharmD Clinical Pharmacist ED Pharmacist Phone # [phone] 01/09/2021 3:58 PM

## 2021-01-09 NOTE — Progress Notes (Signed)
Postop note: Status post incision and drainage left index finger.  Cultures sent.  Necrosis of nailbed and portion of distal phalanx.  Will likely need amputation of part or all of index finger for definitive management.

## 2021-01-09 NOTE — Progress Notes (Signed)
Family Medicine Teaching Service Daily Progress Note Intern Pager: [phone]  Patient name: [name] Medical record number: [id number] Date of birth: Feb 20, 1965 Age: 55 y.o. Gender: female  Primary Care Provider: Sandi Mariscal, MD Consultants: Orthopedic Surgery Code Status: Full  Pt Overview and Major Events to Date:  12/9- admitted 12/9- OR with ortho for I&D of L index finger  Assessment and Plan: [name] is a 55 y.o. female presenting with 10 days of L index finger pain and swelling, now with purulent drainage and X-Ray findings concerning for osteomyelitis. PMH is significant for osteomyelitis of L thumb s/p amputation, ESRD on HD MWF, anemia T2DM, CAD, CHF, HLD, HTN, PVD, OSA.   Osteomyelitis of L Index Finger S/p I&D with orthopedic surgery last night. Per Dr. Levell July post-op note, deep wound cultures were obtained. Concern that she may ultimately require amputation of the digit for definitive management. It seems that her habit of nailbiting may be contributing to her recurrent hand infections.  - Continue cefepime and vancomycin until cultures return - Can d/c flagyl - Follow blood and wound cultures - Elevate extremity - Neurovascular checks - Scheduled Tylenol - PRN oxycodone for post-operative pain  ESRD on HD MWF Last dialysis on Wednesday. Does not appear uremic or volume overloaded at this time. - Consult to nephro, likely needs to be dialyzed in hospital  T2DM Post operative glucose 139. - Continue home Humalog regimen  Anemia, chronic, likely 2/2 ESRD Hgb 9.4>9.0. - Daily CBC - Continue home Auryxia  HTN BP postoperatively 100s/70s.  HLD  PVD - Continue home Crestor   FEN/GI: Heart healthy Carb-modified PPx: SCDs- may need to return to OR Dispo:Home in 2-3 days. Barriers include possible need for further surgery.   Subjective:  [name] reports feeling quite well after surgery last night. She denies any pain in her hand and is  feeling up to eating and drinking this morning. No acute concerns.   Objective: Temp:  [97.5 F (36.4 C)-98.8 F (37.1 C)] 97.5 F (36.4 C) (12/10 0032) Pulse Rate:  [72-82] 75 (12/10 0032) Resp:  [18-26] 20 (12/10 0032) BP: (101-162)/(66-84) 101/71 (12/10 0032) SpO2:  [94 %-100 %] 94 % (12/10 0032) Weight:  [111.1 kg] 111.1 kg (12/09 1536) Physical Exam: General: Tired appearing, but non-toxic Cardiovascular: RRR, transmitted fistula sounds Respiratory: Normal WOB on 4L [place], diminished sounds in L lung base Abdomen: Soft, non-tender Extremities: L hand with surgical dressing and splint in place. Appears clean and dry.   Laboratory: Recent Labs  Lab 01/08/21 0452 01/09/21 1628 01/10/21 0102  WBC 8.0 6.6 7.0  HGB 9.8* 9.4* 9.0*  HCT 30.7* 29.7* 27.9*  PLT 191 169 159   Recent Labs  Lab 01/08/21 0452 01/09/21 1628 01/10/21 0102  NA 130* 133* 135  K 5.0 4.9 5.0  CL 90* 92* 95*  CO2 26 28 26   BUN 32* 51* 54*  CREATININE 4.98* 7.29* 7.83*  CALCIUM 8.8* 9.1 8.4*  PROT 7.9  --   --   BILITOT 0.6  --   --   ALKPHOS 196*  --   --   ALT 18  --   --   AST 22  --   --   GLUCOSE 422* 283* 131*    Imaging/Diagnostic Tests: No new imaging, tests   Eppie Gibson, MD 01/10/2021, 6:59 AM PGY-1, [place] Intern pager: [phone], text pages welcome

## 2021-01-09 NOTE — ED Provider Notes (Signed)
[facility] EMERGENCY DEPARTMENT Provider Note   CSN: [id number] Arrival date & time: 01/09/21  1501     History Chief Complaint  Patient presents with   Hand Pain    [name] is a 55 y.o. female with history of anemia, anxiety, arthritis, CAD, CHF, end-stage renal disease on dialysis, hypertension who presents to emergency department today for evaluation of left finger pain that started a few days ago.  Patient seen in [facility] ED day prior and in triage was planning to have x-ray ordered however ultimately left prior to receiving care.  Patient is transferred from [facility] where she was seen and diagnosed with osteomyelitis.  Hand surgeon was consulted, Dr. Fredna Dow, who instructed PA a trial bridge to send patient to [facility] ED for further management.   Hand Pain Pertinent negatives include no chest pain, no abdominal pain and no shortness of breath.      Past Medical History:  Diagnosis Date   Anemia    Anxiety    Arthritis    "knees" "hands", "RA"   CAD (coronary artery disease)    Nonobstructive on CT 2019   CHF (congestive heart failure) ([place])    COVID-19 10/2018   Dyspnea    "when I have too much fluid."   ESRD (end stage renal disease) (Home)    Dialysis [facility]   Headache(784.0)    Heart murmur    High cholesterol    History of blood transfusion    Hypertension    Mitral regurgitation    moderate to severe MR 10/2018 echo   Nerve pain    "they say I have L5 nerve damage; my lumbar"   Pericardial effusion    Pneumonia    ; 11/16/2018- "touch of pneumonia" - seen in ED- 11/14/2018- on antibiotic. Has had it x 2   PVD (peripheral vascular disease) ([place])    Right leg stent in [place].  (No records)   Restless legs    Sleep apnea    does not use Cpap   Thoracic ascending aortic aneurysm    4.4 cm 11/14/18 CTA   Type II diabetes mellitus [facility])     Patient Active Problem List   Diagnosis Date Noted   Abnormal CT scan, chest  11/27/2020   Encephalopathy acute 11/17/2020   Thyroid nodule 04/10/2020   Acute respiratory failure ([place]) 02/26/2020   Chronic pain 03/20/2019   Preop cardiovascular exam 12/19/2018   Dyslipidemia 11/20/2018   Shortness of breath 10/23/2018   Acute respiratory distress 10/23/2018   Chronic bilateral pleural effusions 10/23/2018   Obesity 10/23/2018   Coronary artery disease 10/23/2018   Tobacco abuse 10/23/2018   Volume overload 10/23/2018   Unspecified protein-calorie malnutrition ([place]) 10/11/2018   Pneumonia 10/10/2018   COVID-19 virus detected 10/10/2018   Acute encephalopathy 10/09/2018   End-stage renal disease on hemodialysis ([place]) 09/27/2018   Acute respiratory failure with hypoxia ([place]) 09/26/2018   Chronic pain disorder 09/26/2018   Other encephalopathy 09/26/2018   COVID-19 virus infection 93/81/8299   Acute metabolic encephalopathy 37/16/9678   Gangrene ([place]) 09/18/2018   Allergy, unspecified, initial encounter 09/04/2018   Anaphylactic shock, unspecified, initial encounter 09/04/2018   Left arm pain 06/27/2018   Educated about COVID-19 virus infection 06/27/2018   Iron deficiency anemia, unspecified 01/10/2018   Coagulation defect, unspecified ([place]) 12/06/2017   Diarrhea, unspecified 10/11/2017   Anemia 09/26/2017   Left hip pain 09/26/2017   Neck pain 09/26/2017   Leukocytosis 09/26/2017  Pruritus, unspecified 09/22/2017   Secondary hyperparathyroidism of renal origin ([place]) 09/01/2017   Disorder of phosphorus metabolism, unspecified 08/25/2017   Anemia in chronic kidney disease 08/24/2017   Chest pain 07/07/2017   Left ventricular hypertrophy 06/17/2017   ESRD on dialysis [facility]) 05/21/2011   Diabetic retinopathy 03/47/4259   Diastolic dysfunction 56/38/7564   Hyperlipidemia 01/30/2011   Type II diabetes mellitus with complication, uncontrolled ([place]) 01/29/2011   Neuropathy 01/29/2011   Hypertension     Past Surgical History:  Procedure Laterality Date    AMPUTATION Left 12/27/2018   Procedure: LEFT THUMB REVISION AMPUTATION DIGIT;  Surgeon: Charlotte Crumb, MD;  Location: [place];  Service: Orthopedics;  Laterality: Left;   AV FISTULA PLACEMENT Left    AV FISTULA PLACEMENT Right 03/12/2019   Procedure: INSERTION OF ARTERIOVENOUS (AV) GORE-TEX GRAFT ARM;  Surgeon: Elam Dutch, MD;  Location: [place];  Service: Vascular;  Laterality: Right;   AV FISTULA PLACEMENT Right 08/13/2019   Procedure: RIGHT UPPER ARM ARTERIOVENOUS (AV) FISTULA CREATION WITH BASILIC VEIN;  Surgeon: Elam Dutch, MD;  Location: [place];  Service: Vascular;  Laterality: Right;   [place]; 1997   x 2   COLONOSCOPY     EYE SURGERY Bilateral    cataract surgery   INSERTION OF DIALYSIS CATHETER Right 09/26/2018   Procedure: INSERTION OF DIALYSIS CATHETER Right Internal Jugular.;  Surgeon: Elam Dutch, MD;  Location: [place];  Service: Vascular;  Laterality: Right;   IR DIALY SHUNT INTRO NEEDLE/INTRACATH INITIAL W/IMG RIGHT Right 11/21/2020   IR FLUORO GUIDE CV LINE RIGHT  05/04/2019   IR US GUIDE VASC ACCESS RIGHT  05/04/2019   IR US GUIDE VASC ACCESS RIGHT  11/21/2020   LIGATION OF ARTERIOVENOUS  FISTULA Left 09/26/2018   Procedure: LIGATION OF ARTERIOVENOUS  FISTULA LEFT ARM;  Surgeon: Elam Dutch, MD;  Location: [place];  Service: Vascular;  Laterality: Left;   PERICARDIAL WINDOW  02/2004   for pericardial effusion   PERIPHERAL VASCULAR INTERVENTION Right 10/26/2019   Procedure: PERIPHERAL VASCULAR INTERVENTION;  Surgeon: Elam Dutch, MD;  Location: [place] CV LAB;  Service: Cardiovascular;  Laterality: Right;  arm  AV fistula   TUBAL LIGATION  05/1995   UPPER EXTREMITY VENOGRAPHY N/A 06/22/2019   Procedure: UPPER EXTREMITY VENOGRAPHY - Right Central;  Surgeon: Elam Dutch, MD;  Location: [place] CV LAB;  Service: Cardiovascular;  Laterality: N/A;     OB History   No obstetric history on file.     Family History  Problem  Relation Age of Onset   Cancer Brother    Heart disease Father        Died age 7   Diabetes Father    Hyperlipidemia Father    Hypertension Father    Stroke Father    Diabetes Mother    Hypertension Mother    Stroke Mother    Dementia Mother    Diabetes Sister    Diabetes Brother    Hypertension Sister    Hypertension Brother     Social History   Tobacco Use   Smoking status: Every Day    Years: 33.00    Types: Cigarettes    Last attempt to quit: 07/17/2019    Years since quitting: 1.4   Smokeless tobacco: Never   Tobacco comments:    using NIcotene gum, rare 1/2 cigarette  Vaping Use   Vaping Use: Never used  Substance Use Topics   Alcohol use: No  Drug use: No    Home Medications Prior to Admission medications   Medication Sig Start Date End Date Taking? Authorizing Provider  acetaminophen (TYLENOL) 500 MG tablet Take 500-1,000 mg by mouth daily as needed for headache (pain).    [provider]  albuterol (VENTOLIN HFA) 108 (90 Base) MCG/ACT inhaler Inhale 2 puffs into the lungs every 6 (six) hours as needed for wheezing or shortness of breath. Patient not taking: No sig reported 02/22/20   Thurnell Lose, MD  Ascorbic Acid (VITAMIN C PO) Take 1 tablet by mouth every morning.    [provider]  AURYXIA 1 GM 210 MG(Fe) tablet Take 840-1,050 mg by mouth See admin instructions. Take 5 tablets (1050 mg) by mouth twice daily with meals, occasionally take 4 tablets (840 mg) with a snack 05/29/18   [provider]  b complex-vitamin c-folic acid (NEPHRO-VITE) 0.8 MG TABS tablet Take 1 tablet by mouth every Monday, Wednesday, and Friday with hemodialysis. 04/12/19   [provider]  glucose blood (ONETOUCH VERIO) test strip 1 each by Other route 2 (two) times daily. And lancets 2/day 04/10/20   Renato Shin, MD  HUMIRA PEN 40 MG/0.4ML PNKT Inject 40 mg into the skin every 14 (fourteen) days. 08/22/20   [provider]  hydrOXYzine  (ATARAX/VISTARIL) 25 MG tablet Take 25 mg by mouth every morning.    [provider]  Insulin Lispro Prot & Lispro (HUMALOG 75/25 MIX) (75-25) 100 UNIT/ML Kwikpen Inject 35 Units into the skin 2 (two) times daily with a meal. Patient taking differently: Inject 35 Units into the skin See admin instructions. 05/01/20   Renato Shin, MD  metoprolol succinate (TOPROL-XL) 100 MG 24 hr tablet Take 100 mg by mouth daily. Take with or immediately following a meal.    [provider]  Multiple Vitamins-Minerals (ZINC PO) Take 1 tablet by mouth every morning.    [provider]  NARCAN 4 MG/0.1ML LIQD nasal spray kit Place 1 spray into the nose as needed (accidental overdose.).  10/10/19   [provider]  Omega-3 Fatty Acids (FISH OIL PO) Take 1 capsule by mouth every morning.    [provider]  OVER THE COUNTER MEDICATION Take 1 capsule by mouth every morning. Omega XL    [provider]  oxyCODONE-acetaminophen (PERCOCET) 10-325 MG tablet Take 1 tablet by mouth 3 (three) times daily as needed for pain. Patient not taking: Reported on 11/23/2020 11/04/20   [provider]  pramipexole (MIRAPEX) 0.5 MG tablet Take 0.5 mg by mouth daily.  05/18/17   [provider]  rosuvastatin (CRESTOR) 40 MG tablet TAKE 1 TABLET BY MOUTH DAILY *PATIENT NEEDS APPOINTMENT* 12/08/20   Minus Breeding, MD    Allergies    Bupropion  Review of Systems   Review of Systems  Constitutional:  Negative for chills and fever.  Respiratory:  Negative for shortness of breath.   Cardiovascular:  Negative for chest pain.  Gastrointestinal:  Negative for abdominal pain, nausea, rectal pain and vomiting.  Genitourinary:  Negative for dysuria and hematuria.  Skin:  Positive for color change and wound.  All other systems reviewed and are negative.  Physical Exam Updated Vital Signs BP (!) 162/84 (BP Location: Left Wrist)   Pulse 72   Temp 98.8 F (37.1 C) (Oral)    Resp 18   Ht _0  (1.727 m)   Wt 111.1 kg   LMP  (LMP Unknown) Comment: LMP Feb 2011  SpO2 97%  BMI 37.25 kg/m   Physical Exam Constitutional:      General: She is not in acute distress.    Appearance: She is not toxic-appearing.  HENT:     Head: Normocephalic.     Mouth/Throat:     Mouth: Mucous membranes are moist.  Cardiovascular:     Rate and Rhythm: Normal rate and regular rhythm.  Pulmonary:     Effort: Pulmonary effort is normal.     Breath sounds: Normal breath sounds. No wheezing.  Abdominal:     General: Abdomen is flat.     Palpations: Abdomen is soft.     Tenderness: There is no abdominal tenderness.  Musculoskeletal:     Cervical back: Normal range of motion.     Comments: TTP and discoloration noted to the tip of the left index finger. Appears to have purulence beneath the right fingernail and to the pad of the index finger. Scant amount of bleeding noted.    Skin:    General: Skin is warm and dry.     Capillary Refill: Capillary refill takes less than 2 seconds.  Neurological:     Mental Status: She is alert.  Psychiatric:        Mood and Affect: Mood normal.    ED Results / Procedures / Treatments   Labs (all labs ordered are listed, but only abnormal results are displayed) Labs Reviewed  CBC WITH DIFFERENTIAL/PLATELET - Abnormal; Notable for the following components:      Result Value   RBC 3.07 (*)    Hemoglobin 9.4 (*)    HCT 29.7 (*)    All other components within normal limits  BASIC METABOLIC PANEL - Abnormal; Notable for the following components:   Sodium 133 (*)    Chloride 92 (*)    Glucose, Bld 283 (*)    BUN 51 (*)    Creatinine, Ser 7.29 (*)    GFR, Estimated 6 (*)    All other components within normal limits  RESP PANEL BY RT-PCR (FLU A&B, COVID) ARPGX2  CULTURE, BLOOD (ROUTINE X 2)  CULTURE, BLOOD (ROUTINE X 2)  LACTIC ACID, PLASMA  LACTIC ACID, PLASMA    EKG None  Radiology DG Hand Complete Left  Result Date:  01/08/2021 CLINICAL DATA:  55 year old female with pain and purulent drainage from the left index finger. EXAM: LEFT HAND - COMPLETE 3+ VIEW COMPARISON:  No priors. FINDINGS: Patient is status post amputation of the first digit beyond the base of the proximal phalanx. There is a moth-eaten appearance of the second distal phalanx with irregularity in the overlying soft tissues and some soft tissue gas, highly concerning for osteomyelitis. Remaining bones of the left hand are otherwise unremarkable in appearance. IMPRESSION: 1. Findings are highly concerning for acute osteomyelitis of the left second distal phalanx, as above. 2. Status post amputation of the left first digit beyond the base of the proximal phalanx. Electronically Signed   By: Vinnie Langton M.D.   On: 01/08/2021 05:18    Procedures Procedures   Medications Ordered in ED Medications  metroNIDAZOLE (FLAGYL) IVPB 500 mg (has no administration in time range)  vancomycin (VANCOCIN) IVPB 1000 mg/200 mL premix (1,000 mg Intravenous New Bag/Given 01/09/21 1740)    Followed by  vancomycin (VANCOCIN) IVPB 1000 mg/200 mL premix (has no administration in time range)    Followed by  vancomycin (VANCOREADY) IVPB 500 mg/100 mL (has no administration in time range)  vancomycin (VANCOCIN) IVPB 1000 mg/200 mL premix (has no  administration in time range)  ceFEPIme (MAXIPIME) 1 g in sodium chloride 0.9 % 100 mL IVPB (has no administration in time range)  ceFEPIme (MAXIPIME) 2 g in sodium chloride 0.9 % 100 mL IVPB (2 g Intravenous New Bag/Given 01/09/21 1634)  fentaNYL (SUBLIMAZE) injection 50 mcg (50 mcg Intravenous Given 01/09/21 1635)    ED Course  I have reviewed the triage vital signs and the nursing notes.  Pertinent labs & imaging results that were available during my care of the patient were reviewed by me and considered in my medical decision making (see chart for details).    MDM Rules/Calculators/A&P                           55 year old female here with left hand pain.  Previously seen at [facility] ED, transferred to this ED. On imaging, appearance of osteomyelitis.  Hand surgeon has been consulted.  Dr. Fredna Dow wishes to have this patient admitted through hospitalist service.  Patient at end of shift is handed off to Dr. Sherry Ruffing for admit. Patient stable at time of admit.   Final Clinical Impression(s) / ED Diagnoses Final diagnoses:  Osteomyelitis of left hand, unspecified type [facility])    Rx / DC Orders ED Discharge Orders     None        Lawana Chambers 01/09/21 1858    Tegeler, Gwenyth Allegra, MD 01/09/21 2243

## 2021-01-09 NOTE — ED Notes (Signed)
Called [facility] to request transport to [facility] ED

## 2021-01-10 ENCOUNTER — Encounter (HOSPITAL_COMMUNITY): Payer: Self-pay | Admitting: Orthopedic Surgery

## 2021-01-10 DIAGNOSIS — Z794 Long term (current) use of insulin: Secondary | ICD-10-CM

## 2021-01-10 DIAGNOSIS — E1152 Type 2 diabetes mellitus with diabetic peripheral angiopathy with gangrene: Secondary | ICD-10-CM | POA: Diagnosis not present

## 2021-01-10 DIAGNOSIS — I152 Hypertension secondary to endocrine disorders: Secondary | ICD-10-CM

## 2021-01-10 DIAGNOSIS — G894 Chronic pain syndrome: Secondary | ICD-10-CM | POA: Diagnosis not present

## 2021-01-10 DIAGNOSIS — E669 Obesity, unspecified: Secondary | ICD-10-CM

## 2021-01-10 DIAGNOSIS — N186 End stage renal disease: Secondary | ICD-10-CM | POA: Diagnosis not present

## 2021-01-10 DIAGNOSIS — Z79899 Other long term (current) drug therapy: Secondary | ICD-10-CM

## 2021-01-10 DIAGNOSIS — Z9115 Patient's noncompliance with renal dialysis: Secondary | ICD-10-CM | POA: Diagnosis not present

## 2021-01-10 DIAGNOSIS — L97929 Non-pressure chronic ulcer of unspecified part of left lower leg with unspecified severity: Secondary | ICD-10-CM | POA: Diagnosis present

## 2021-01-10 DIAGNOSIS — E1169 Type 2 diabetes mellitus with other specified complication: Secondary | ICD-10-CM | POA: Diagnosis not present

## 2021-01-10 DIAGNOSIS — G8929 Other chronic pain: Secondary | ICD-10-CM | POA: Diagnosis present

## 2021-01-10 DIAGNOSIS — J9611 Chronic respiratory failure with hypoxia: Secondary | ICD-10-CM | POA: Diagnosis not present

## 2021-01-10 DIAGNOSIS — E1122 Type 2 diabetes mellitus with diabetic chronic kidney disease: Secondary | ICD-10-CM

## 2021-01-10 DIAGNOSIS — D84821 Immunodeficiency due to drugs: Secondary | ICD-10-CM | POA: Diagnosis not present

## 2021-01-10 DIAGNOSIS — R918 Other nonspecific abnormal finding of lung field: Secondary | ICD-10-CM | POA: Diagnosis present

## 2021-01-10 DIAGNOSIS — R911 Solitary pulmonary nodule: Secondary | ICD-10-CM

## 2021-01-10 DIAGNOSIS — D259 Leiomyoma of uterus, unspecified: Secondary | ICD-10-CM

## 2021-01-10 DIAGNOSIS — M069 Rheumatoid arthritis, unspecified: Secondary | ICD-10-CM

## 2021-01-10 DIAGNOSIS — M869 Osteomyelitis, unspecified: Secondary | ICD-10-CM | POA: Diagnosis not present

## 2021-01-10 DIAGNOSIS — Z992 Dependence on renal dialysis: Secondary | ICD-10-CM | POA: Diagnosis not present

## 2021-01-10 DIAGNOSIS — E1159 Type 2 diabetes mellitus with other circulatory complications: Secondary | ICD-10-CM | POA: Diagnosis not present

## 2021-01-10 DIAGNOSIS — Z720 Tobacco use: Secondary | ICD-10-CM

## 2021-01-10 DIAGNOSIS — K439 Ventral hernia without obstruction or gangrene: Secondary | ICD-10-CM | POA: Insufficient documentation

## 2021-01-10 DIAGNOSIS — G2581 Restless legs syndrome: Secondary | ICD-10-CM | POA: Diagnosis present

## 2021-01-10 DIAGNOSIS — G4733 Obstructive sleep apnea (adult) (pediatric): Secondary | ICD-10-CM

## 2021-01-10 DIAGNOSIS — N2889 Other specified disorders of kidney and ureter: Secondary | ICD-10-CM

## 2021-01-10 DIAGNOSIS — I509 Heart failure, unspecified: Secondary | ICD-10-CM | POA: Diagnosis present

## 2021-01-10 DIAGNOSIS — Z8616 Personal history of COVID-19: Secondary | ICD-10-CM | POA: Diagnosis not present

## 2021-01-10 DIAGNOSIS — D631 Anemia in chronic kidney disease: Secondary | ICD-10-CM | POA: Diagnosis not present

## 2021-01-10 DIAGNOSIS — L089 Local infection of the skin and subcutaneous tissue, unspecified: Secondary | ICD-10-CM | POA: Diagnosis not present

## 2021-01-10 DIAGNOSIS — Z6837 Body mass index (BMI) 37.0-37.9, adult: Secondary | ICD-10-CM | POA: Diagnosis not present

## 2021-01-10 DIAGNOSIS — N2581 Secondary hyperparathyroidism of renal origin: Secondary | ICD-10-CM | POA: Diagnosis present

## 2021-01-10 DIAGNOSIS — I83009 Varicose veins of unspecified lower extremity with ulcer of unspecified site: Secondary | ICD-10-CM | POA: Diagnosis not present

## 2021-01-10 DIAGNOSIS — Z20822 Contact with and (suspected) exposure to covid-19: Secondary | ICD-10-CM | POA: Diagnosis not present

## 2021-01-10 HISTORY — DX: Leiomyoma of uterus, unspecified: D25.9

## 2021-01-10 LAB — HEPATITIS B CORE ANTIBODY, TOTAL: Hep B Core Total Ab: NONREACTIVE

## 2021-01-10 LAB — RENAL FUNCTION PANEL
Albumin: 3 g/dL — ABNORMAL LOW (ref 3.5–5.0)
Anion gap: 14 (ref 5–15)
BUN: 54 mg/dL — ABNORMAL HIGH (ref 6–20)
CO2: 26 mmol/L (ref 22–32)
Calcium: 8.4 mg/dL — ABNORMAL LOW (ref 8.9–10.3)
Chloride: 95 mmol/L — ABNORMAL LOW (ref 98–111)
Creatinine, Ser: 7.83 mg/dL — ABNORMAL HIGH (ref 0.44–1.00)
GFR, Estimated: 6 mL/min — ABNORMAL LOW (ref >60)
Glucose, Bld: 131 mg/dL — ABNORMAL HIGH (ref 70–99)
Phosphorus: 6.1 mg/dL — ABNORMAL HIGH (ref 2.5–4.6)
Potassium: 5 mmol/L (ref 3.5–5.1)
Sodium: 135 mmol/L (ref 135–145)

## 2021-01-10 LAB — GLUCOSE, CAPILLARY
Glucose-Capillary: 102 mg/dL — ABNORMAL HIGH (ref 70–99)
Glucose-Capillary: 135 mg/dL — ABNORMAL HIGH (ref 70–99)
Glucose-Capillary: 139 mg/dL — ABNORMAL HIGH (ref 70–99)
Glucose-Capillary: 188 mg/dL — ABNORMAL HIGH (ref 70–99)
Glucose-Capillary: 219 mg/dL — ABNORMAL HIGH (ref 70–99)

## 2021-01-10 LAB — LACTIC ACID, PLASMA
Lactic Acid, Venous: 1.1 mmol/L (ref 0.5–1.9)
Lactic Acid, Venous: 0.9 mmol/L (ref 0.5–1.9)

## 2021-01-10 LAB — CBC
HCT: 27.9 % — ABNORMAL LOW (ref 36.0–46.0)
Hemoglobin: 9 g/dL — ABNORMAL LOW (ref 12.0–15.0)
MCH: 31.4 pg (ref 26.0–34.0)
MCHC: 32.3 g/dL (ref 30.0–36.0)
MCV: 97.2 fL (ref 80.0–100.0)
Platelets: 159 10*3/uL (ref 150–400)
RBC: 2.87 MIL/uL — ABNORMAL LOW (ref 3.87–5.11)
RDW: 15.2 % (ref 11.5–15.5)
WBC: 7 10*3/uL (ref 4.0–10.5)
nRBC: 0 % (ref 0.0–0.2)

## 2021-01-10 LAB — HEPATITIS B SURFACE ANTIGEN: Hepatitis B Surface Ag: NONREACTIVE

## 2021-01-10 MED ORDER — EPHEDRINE 5 MG/ML INJ
INTRAVENOUS | Status: AC
  Filled 2021-01-10: qty 10

## 2021-01-10 MED ORDER — CEFAZOLIN SODIUM 1 G IJ SOLR
INTRAMUSCULAR | Status: AC
  Filled 2021-01-10: qty 20

## 2021-01-10 MED ORDER — OXYCODONE HCL 5 MG PO TABS
5.0000 mg | ORAL_TABLET | ORAL | Status: DC | PRN
Start: 2021-01-10 — End: 2021-01-12

## 2021-01-10 MED ORDER — LACTATED RINGERS IV SOLN: INTRAVENOUS | Status: DC

## 2021-01-10 MED ORDER — PHENYLEPHRINE 40 MCG/ML (10ML) SYRINGE FOR IV PUSH (FOR BLOOD PRESSURE SUPPORT)
PREFILLED_SYRINGE | INTRAVENOUS | Status: AC
  Filled 2021-01-10: qty 20

## 2021-01-10 MED ORDER — LIDOCAINE 2% (20 MG/ML) 5 ML SYRINGE
INTRAMUSCULAR | Status: AC
  Filled 2021-01-10: qty 10

## 2021-01-10 MED ORDER — INSULIN ASPART PROT & ASPART (70-30 MIX) 100 UNIT/ML ~~LOC~~ SUSP
35.0000 [IU] | Freq: Two times a day (BID) | SUBCUTANEOUS | Status: DC
  Administered 2021-01-10 – 2021-01-11 (×3): 35 [IU] via SUBCUTANEOUS
  Filled 2021-01-10 (×2): qty 10

## 2021-01-10 MED ORDER — PRAMIPEXOLE DIHYDROCHLORIDE 0.125 MG PO TABS
0.5000 mg | ORAL_TABLET | Freq: Every day | ORAL | Status: DC
  Administered 2021-01-10 – 2021-01-11 (×2): 0.5 mg via ORAL
  Filled 2021-01-10 (×2): qty 4

## 2021-01-10 MED ORDER — FERRIC CITRATE 1 GM 210 MG(FE) PO TABS
1050.0000 mg | ORAL_TABLET | Freq: Three times a day (TID) | ORAL | Status: DC
  Administered 2021-01-10 – 2021-01-12 (×6): 1050 mg via ORAL
  Filled 2021-01-10 (×8): qty 5

## 2021-01-10 MED ORDER — ONDANSETRON HCL 4 MG/2ML IJ SOLN
INTRAMUSCULAR | Status: AC
  Filled 2021-01-10: qty 4

## 2021-01-10 MED ORDER — ROSUVASTATIN CALCIUM 20 MG PO TABS
40.0000 mg | ORAL_TABLET | Freq: Every day | ORAL | Status: DC
  Administered 2021-01-10 – 2021-01-11 (×2): 40 mg via ORAL
  Filled 2021-01-10 (×2): qty 2

## 2021-01-10 MED ORDER — FERRIC CITRATE 1 GM 210 MG(FE) PO TABS
840.0000 mg | ORAL_TABLET | ORAL | Status: DC | PRN
  Filled 2021-01-10: qty 4

## 2021-01-10 MED ORDER — ROCURONIUM BROMIDE 10 MG/ML (PF) SYRINGE
PREFILLED_SYRINGE | INTRAVENOUS | Status: AC
  Filled 2021-01-10: qty 30

## 2021-01-10 MED ORDER — CHLORHEXIDINE GLUCONATE CLOTH 2 % EX PADS
6.0000 | MEDICATED_PAD | Freq: Every day | CUTANEOUS | Status: DC
  Administered 2021-01-10 – 2021-01-12 (×3): 6 via TOPICAL

## 2021-01-10 MED ORDER — METOPROLOL TARTRATE 25 MG PO TABS
25.0000 mg | ORAL_TABLET | Freq: Two times a day (BID) | ORAL | Status: DC
  Administered 2021-01-11 (×2): 25 mg via ORAL
  Filled 2021-01-10 (×2): qty 1

## 2021-01-10 MED ORDER — METOPROLOL SUCCINATE ER 100 MG PO TB24
100.0000 mg | ORAL_TABLET | Freq: Every day | ORAL | Status: DC
  Administered 2021-01-10: 100 mg via ORAL
  Filled 2021-01-10: qty 1

## 2021-01-10 MED ORDER — DIPHENHYDRAMINE HCL 50 MG/ML IJ SOLN
INTRAMUSCULAR | Status: AC
  Filled 2021-01-10: qty 1

## 2021-01-10 MED ORDER — ACETAMINOPHEN 325 MG PO TABS
650.0000 mg | ORAL_TABLET | Freq: Four times a day (QID) | ORAL | Status: DC
  Administered 2021-01-10 – 2021-01-12 (×7): 650 mg via ORAL
  Filled 2021-01-10 (×7): qty 2

## 2021-01-10 MED ORDER — SUCCINYLCHOLINE CHLORIDE 200 MG/10ML IV SOSY
PREFILLED_SYRINGE | INTRAVENOUS | Status: AC
  Filled 2021-01-10: qty 30

## 2021-01-10 NOTE — Progress Notes (Signed)
FPTS Brief Progress Note  S:Patient awake with legs hanging off side of the bed when I entered the room. She endorses feeling well, just wondering what will be done about her finger. Discussed the plan for the amputation which she said she was aware of. She asked about finger prosthetics   O: BP 115/60 (BP Location: Left Arm)   Pulse 77   Temp 98 F (36.7 C)   Resp 17   Ht 5\' 8"  (1.727 m)   Wt 116.9 kg   LMP  (LMP Unknown) Comment: LMP Feb 2011  SpO2 91%   BMI 39.19 kg/m   General: alert, laying in bed, NAD CV: RRR Resp: normal WOB   A/P: Osteomyelitis of L 2nd digit  S/p I&D. Ortho following, anticipates amputation of L 2nd digit Tuesday or Thursday afternoon - Follow-up blood and wound cultures -Continue cefepime and Vanco until cultures return - Tylenol scheduled - Oxycodone as needed for postop pain  Remainder of management per progress note   - Orders reviewed. Labs for AM ordered, which was adjusted as needed.   Shary Key, DO 01/10/2021, 11:05 PM PGY-2, [facility] Night Resident  Please page [phone] with questions.

## 2021-01-10 NOTE — Anesthesia Postprocedure Evaluation (Signed)
Anesthesia Post Note  Patient: [name]  Procedure(s) Performed: IRRIGATION AND DEBRIDEMENT INDEX FINGER (Left: Finger)     Patient location during evaluation: PACU Anesthesia Type: General Level of consciousness: awake and alert Pain management: pain level controlled Vital Signs Assessment: post-procedure vital signs reviewed and stable Respiratory status: spontaneous breathing, nonlabored ventilation, respiratory function stable and patient connected to nasal cannula oxygen Cardiovascular status: blood pressure returned to baseline and stable Postop Assessment: no apparent nausea or vomiting Anesthetic complications: no   No notable events documented.  Last Vitals:  Vitals:   01/10/21 0010 01/10/21 0032  BP: 107/70 101/71  Pulse: 76 75  Resp: (!) 25 20  Temp: 36.8 C (!) 36.4 C  SpO2: 100% 94%    Last Pain:  Vitals:   01/10/21 0032  TempSrc: Oral  PainSc:                  Akshaj Besancon,W. EDMOND

## 2021-01-10 NOTE — Hospital Course (Addendum)
[name] is a 55 year old female presenting with osteomyelitis of the left index finger.  Past medical history significant for osteomyelitis of the left thumb status post amputation, ESRD on HD MWF, anemia, type 2 diabetes, CAD, CHF, HLD, HTN, PVD, OSA.  Hospital course as outlined below:   Osteomyelitis of L Index Finger Ms Dunivan presented to the [facility] on 12/9 with 10 days of L index finger pain, swelling, and purulent drainage. X ray obtained in the ED showed concern for osteomyelitis and orthopedic surgery recommended urgent surgical intervention. She was started on broad spectrum antibiotics and taken to the OR for an I&D. Deep wound cultures were obtained and did not grow anything for 2 days.  She was discharged in stable condition on 12/12 with plans for outpatient amputation of the finger per orthopedic surgery. She was discharged on doxycycline and Keflex for antibiotic coverage until definitive surgical cure could be achieved.   ESRD on HD MWF Nephrology was consulted and continue with hemodialysis while inpatient.  Type 2 diabetes Patient remained on her home regimen of Humalog 75/25 at 35 units twice daily with meals.   Pulmonary nodules on CT  Incidental finding on CTA chest 11/25/2020 which radiology interpretation mentions suspicion for metastatic disease given concern for renal cancer seen on 08/2019 CTAP.  Patient is a non-smoker.  Patient has not yet seen pulmonology.  Discussed case with pulmonology on 12/12 and she will follow-up with them in their office.  Chronic conditions that were medically managed/monitored: Hypertension, HLD, PVD, tobacco use disorder, anemia.   Items for PCP follow-up 1) Please ensure close follow-up with pulmonology for multiple lung nodules 2) Please ensure follow-up with ortho surgery s/p amputation 3) Patient has multiple risk factors for recurrent finger infections. Namely fingernail biting and immunosuppression on adalimumab. Consider  behavioral intervention  for nailbiting and/or discontinuation of adalimumab 4) Patient would benefit from both tobacco cessation and weight loss. Consider primary-care based interventions for both of these.

## 2021-01-10 NOTE — Progress Notes (Signed)
Subjective: 1 Day Post-Op Procedure(s) (LRB): IRRIGATION AND DEBRIDEMENT INDEX FINGER (Left) Patient reports no pain in finger.    Objective: Vital signs in last 24 hours: Temp:  [97.1 F (36.2 C)-98.8 F (37.1 C)] 97.8 F (36.6 C) (12/10 1722) Pulse Rate:  [69-76] 75 (12/10 1722) Resp:  [18-26] 18 (12/10 1722) BP: (101-162)/(51-84) 105/52 (12/10 1722) SpO2:  [94 %-100 %] 99 % (12/10 1722) Weight:  [116.9 kg-119.4 kg] 116.9 kg (12/10 1643)  Intake/Output from previous day: 12/09 0701 - 12/10 0700 In: 664.9 [P.O.:120; I.V.:544.9] Out: 5 [Blood:5] Intake/Output this shift: Total I/O In: 300 [P.O.:300] Out: 2513 [Other:2513]  Recent Labs    01/08/21 0452 01/09/21 1628 01/10/21 0102  HGB 9.8* 9.4* 9.0*   Recent Labs    01/09/21 1628 01/10/21 0102  WBC 6.6 7.0  RBC 3.07* 2.87*  HCT 29.7* 27.9*  PLT 169 159   Recent Labs    01/09/21 1628 01/10/21 0102  NA 133* 135  K 4.9 5.0  CL 92* 95*  CO2 28 26  BUN 51* 54*  CREATININE 7.29* 7.83*  GLUCOSE 283* 131*  CALCIUM 9.1 8.4*   No results for input(s): LABPT, INR in the last 72 hours.  Dressing c/d/i   Assessment/Plan: 1 Day Post-Op Procedure(s) (LRB): IRRIGATION AND DEBRIDEMENT INDEX FINGER (Left) Discussed that distal aspect of index finger is necrotic and will need amputation for definitive management.  She would like to maintain some length to the digit if possible.  Anticipate amputation through dip joint or through midportion of middle phalanx.  Expect procedure later this week, likely Tuesday or Thursday afternoon.  Okay for d/c home from hand standpoint with antibiotics and scheduling amputation as an outpatient if medically safe to do so.   Leanora Cover 01/10/2021, 5:59 PM

## 2021-01-10 NOTE — Consult Note (Addendum)
[facility] Renal Consultation Note    Indication for Consultation:  Management of ESRD/hemodialysis; anemia, hypertension/volume and secondary hyperparathyroidism  PCP:Sun, Mikeal Hawthorne, MD  HPI: [name] is a 55 y.o. female with ESRD on HD MWF at [facility].  Past medical history significant for Type 2 DM, HTN, CHF, hyperlipidemia, Hx pericardial window (2004), renal mass, Hx chronic wounds and osteomyelitis of L thumb s/p amputation, chronic hypoxic respiratory failure requiring 4L O2 at home.    Patient presented to the ED yesterday due to "green and red discharge" from R index finger.  Reports increased pain and malodorous discharge from R 2nd digit  starting 3 days ago.  Came to the ED on 12/8 but left after several hours prior to completion of work up due to needing to go to dialysis.  Although it was not her dialysis day and she did not go to HD that day.  Returned yesterday for evaluation.  Seen and examined in room.  Denies fever, chills, n/v/d, abdominal pain, SOB, CP, weakness, dizziness and fatigue.  Pain in her finger resolved.  Reports she missed dialysis yesterday because she was coming to the ED.  Of note, patient is typically complaint with HD.  She tends to have large intra dialytic weight gains but typically meets her dry weight with net UF 4-6L with each treatment.   Pertinent findings in work up include xray of L hand with osteomyelitis of L 2nd digit s/p I&D of L index finger felon.  Patient has been admitted for further evaluation and management.    Past Medical History:  Diagnosis Date   Acute encephalopathy 03/08/8525   Acute metabolic encephalopathy 7/82/4235   Acute respiratory distress 10/23/2018   Acute respiratory failure with hypoxia ([place]) 09/26/2018   Anaphylactic shock, unspecified, initial encounter 09/04/2018   Anemia    Anxiety    Arthritis    "knees" "hands", "RA"   CAD (coronary artery disease)    Nonobstructive on CT 2019   CHF  (congestive heart failure) ([place])    Chronic bilateral pleural effusions 10/23/2018   COVID-19 10/2018   COVID-19 virus detected 10/10/2018   COVID-19 virus infection 09/18/2018   Dyspnea    "when I have too much fluid."   ESRD (end stage renal disease) ([place])    Dialysis [facility]- 3rd st   Gangrene ([place]) 09/18/2018   Headache(784.0)    Heart murmur    High cholesterol    History of blood transfusion    Hypertension    Mitral regurgitation    moderate to severe MR 10/2018 echo   Nerve pain    "they say I have L5 nerve damage; my lumbar"   Neuropathy 01/29/2011   04/2011 MRI L-spine:  L5-S1: Bulge/shallow broad-based protrusion greatest centrally and in the right posterior lateral position. Minimal crowding of the  upper aspect of the S1 nerve root greater on the right. Left lateral disc osteophyte with mild encroachment upon but not  significant compression of the exiting left L5 nerve root.   05/2011: Dr. Sherwood Gambler ([facility]): DJD and mild disc bu   Pericardial effusion    Pneumonia    ; 11/16/2018- "touch of pneumonia" - seen in ED- 11/14/2018- on antibiotic. Has had it x 2   Pneumonia 10/10/2018   PVD (peripheral vascular disease) ([place])    Right leg stent in [place].  (No records)   Restless legs    Sleep apnea    does not use Cpap   Thoracic ascending  aortic aneurysm    4.4 cm 11/14/18 CTA   Type II diabetes mellitus ([place])    Uterine leiomyoma 01/10/2021   Past Surgical History:  Procedure Laterality Date   AMPUTATION Left 12/27/2018   Procedure: LEFT THUMB REVISION AMPUTATION DIGIT;  Surgeon: Charlotte Crumb, MD;  Location: [place];  Service: Orthopedics;  Laterality: Left;   AV FISTULA PLACEMENT Left    AV FISTULA PLACEMENT Right 03/12/2019   Procedure: INSERTION OF ARTERIOVENOUS (AV) GORE-TEX GRAFT ARM;  Surgeon: Elam Dutch, MD;  Location: [facility] OR;  Service: Vascular;  Laterality: Right;   AV FISTULA PLACEMENT Right 08/13/2019   Procedure: RIGHT UPPER ARM  ARTERIOVENOUS (AV) FISTULA CREATION WITH BASILIC VEIN;  Surgeon: Elam Dutch, MD;  Location: [place];  Service: Vascular;  Laterality: Right;   [place]; 1997   x 2   COLONOSCOPY     EYE SURGERY Bilateral    cataract surgery   I & D EXTREMITY Left 01/09/2021   Procedure: IRRIGATION AND DEBRIDEMENT INDEX FINGER;  Surgeon: Leanora Cover, MD;  Location: [place];  Service: Orthopedics;  Laterality: Left;   INSERTION OF DIALYSIS CATHETER Right 09/26/2018   Procedure: INSERTION OF DIALYSIS CATHETER Right Internal Jugular.;  Surgeon: Elam Dutch, MD;  Location: [facility] OR;  Service: Vascular;  Laterality: Right;   IR DIALY SHUNT INTRO NEEDLE/INTRACATH INITIAL W/IMG RIGHT Right 11/21/2020   IR FLUORO GUIDE CV LINE RIGHT  05/04/2019   IR US GUIDE VASC ACCESS RIGHT  05/04/2019   IR US GUIDE VASC ACCESS RIGHT  11/21/2020   LIGATION OF ARTERIOVENOUS  FISTULA Left 09/26/2018   Procedure: LIGATION OF ARTERIOVENOUS  FISTULA LEFT ARM;  Surgeon: Elam Dutch, MD;  Location: [place];  Service: Vascular;  Laterality: Left;   PERICARDIAL WINDOW  02/2004   for pericardial effusion   PERIPHERAL VASCULAR INTERVENTION Right 10/26/2019   Procedure: PERIPHERAL VASCULAR INTERVENTION;  Surgeon: Elam Dutch, MD;  Location: [place] [facility];  Service: Cardiovascular;  Laterality: Right;  arm  AV fistula   TUBAL LIGATION  05/1995   UPPER EXTREMITY VENOGRAPHY N/A 06/22/2019   Procedure: UPPER EXTREMITY VENOGRAPHY - Right [place];  Surgeon: Elam Dutch, MD;  Location: [place] [facility];  Service: Cardiovascular;  Laterality: N/A;   Family History  Problem Relation Age of Onset   Cancer Brother    Heart disease Father        Died age 90   Diabetes Father    Hyperlipidemia Father    Hypertension Father    Stroke Father    Diabetes Mother    Hypertension Mother    Stroke Mother    Dementia Mother    Diabetes Sister    Diabetes Brother    Hypertension Sister    Hypertension Brother     Social History:  reports that she has been smoking cigarettes. She has never used smokeless tobacco. She reports that she does not drink alcohol and does not use drugs. Allergies  Allergen Reactions   Dilaudid [Hydromorphone] Other (See Comments)    Apnea, required intubation   Bupropion Itching   Prior to Admission medications   Medication Sig Start Date End Date Taking? Authorizing Provider  acetaminophen (TYLENOL) 500 MG tablet Take 500-1,000 mg by mouth daily as needed for headache (pain).   Yes [provider]  albuterol (VENTOLIN HFA) 108 (90 Base) MCG/ACT inhaler Inhale 2 puffs into the lungs every 6 (six) hours as needed for wheezing or shortness of  breath. 02/22/20  Yes Thurnell Lose, MD  Ascorbic Acid (VITAMIN C PO) Take 1 tablet by mouth See admin instructions. At dialysis Monday,Wednesday and friday   Yes [provider]  AURYXIA 1 GM 210 MG(Fe) tablet Take 840-1,050 mg by mouth See admin instructions. Take 5 tablets (1050 mg) by mouth twice daily with meals, occasionally take 4 tablets (840 mg) with a snack 05/29/18  Yes [provider]  b complex-vitamin c-folic acid (NEPHRO-VITE) 0.8 MG TABS tablet Take 1 tablet by mouth every Monday, Wednesday, and Friday with hemodialysis. 04/12/19  Yes [provider]  HUMIRA PEN 40 MG/0.4ML PNKT Inject 40 mg into the skin every 14 (fourteen) days. 08/22/20  Yes [provider]  hydrOXYzine (ATARAX/VISTARIL) 25 MG tablet Take 25 mg by mouth every morning.   Yes [provider]  Insulin Lispro Prot & Lispro (HUMALOG 75/25 MIX) (75-25) 100 UNIT/ML Kwikpen Inject 35 Units into the skin 2 (two) times daily with a meal. Patient taking differently: Inject 35 Units into the skin in the morning and at bedtime. 05/01/20  Yes Renato Shin, MD  Methoxy PEG-Epoetin Beta (MIRCERA IJ) Dialysis Monday,Wednesday and friday 12/19/20 12/18/21 Yes [provider]  metoprolol succinate (TOPROL-XL)  100 MG 24 hr tablet Take 100 mg by mouth daily. Take with or immediately following a meal.   Yes [provider]  Multiple Vitamins-Minerals (ZINC PO) Take 1 tablet by mouth every morning.   Yes [provider]  NARCAN 4 MG/0.1ML LIQD nasal spray kit Place 1 spray into the nose as needed (accidental overdose.).  10/10/19  Yes [provider]  OVER THE COUNTER MEDICATION Take 1 capsule by mouth every morning. Omega XL   Yes [provider]  oxyCODONE-acetaminophen (PERCOCET) 10-325 MG tablet Take 1 tablet by mouth 3 (three) times daily as needed for pain. 11/04/20  Yes [provider]  pramipexole (MIRAPEX) 0.5 MG tablet Take 0.5 mg by mouth daily.  05/18/17  Yes [provider]  rosuvastatin (CRESTOR) 40 MG tablet TAKE 1 TABLET BY MOUTH DAILY *PATIENT NEEDS APPOINTMENT* Patient taking differently: Take 40 mg by mouth daily. 12/08/20  Yes Hochrein, Jeneen Rinks, MD  glucose blood (ONETOUCH VERIO) test strip 1 each by Other route 2 (two) times daily. And lancets 2/day 04/10/20   Renato Shin, MD  Omega-3 Fatty Acids (FISH OIL PO) Take 1 capsule by mouth every morning. Patient not taking: Reported on 01/09/2021    [provider]   Current Facility-Administered Medications  Medication Dose Route Frequency Provider Last Rate Last Admin   acetaminophen (TYLENOL) tablet 650 mg  650 mg Oral Q6H Eppie Gibson, MD   650 mg at 01/10/21 8676   ceFEPIme (MAXIPIME) 1 g in sodium chloride 0.9 % 100 mL IVPB  1 g Intravenous Q24H Eppie Gibson, MD       Chlorhexidine Gluconate Cloth 2 % PADS 6 each  6 each Topical Q0600 Penninger, Ria Comment, [place]   6 each at 01/10/21 0916   ferric citrate (AURYXIA) tablet 1,050 mg  1,050 mg Oral TID WC McDiarmid, Blane Ohara, MD   1,050 mg at 01/10/21 0915   ferric citrate (AURYXIA) tablet 840 mg  840 mg Oral PRN Eppie Gibson, MD       insulin aspart protamine- aspart (NOVOLOG MIX 70/30) injection 35 Units  35 Units Subcutaneous  BID WC Eppie Gibson, MD   35 Units at 01/10/21 7209   lactated ringers infusion   Intravenous Continuous Leanora Cover,  MD 75 mL/hr at 01/10/21 0036 New Bag at 01/10/21 0036   metoprolol succinate (TOPROL-XL) 24 hr tablet 100 mg  100 mg Oral Daily Eppie Gibson, MD   100 mg at 01/10/21 9244   nicotine (NICODERM CQ - dosed in mg/24 hr) patch 7 mg  7 mg Transdermal Daily Leanora Cover, MD   7 mg at 01/10/21 6286   oxyCODONE (Oxy IR/ROXICODONE) immediate release tablet 5-10 mg  5-10 mg Oral Q4H PRN Eppie Gibson, MD       pramipexole (MIRAPEX) tablet 0.5 mg  0.5 mg Oral Daily Jim Like B, MD   0.5 mg at 01/10/21 0914   rosuvastatin (CRESTOR) tablet 40 mg  40 mg Oral Daily Eppie Gibson, MD   40 mg at 01/10/21 0915   [START ON 01/12/2021] vancomycin (VANCOCIN) IVPB 1000 mg/200 mL premix  1,000 mg Intravenous Q M,W,F-HD Eppie Gibson, MD       vancomycin (VANCOREADY) IVPB 1500 mg/300 mL  1,500 mg Intravenous Once Leanora Cover, MD       Labs: Basic Metabolic Panel: Recent Labs  Lab 01/08/21 0452 01/09/21 1628 01/10/21 0102  NA 130* 133* 135  K 5.0 4.9 5.0  CL 90* 92* 95*  CO2 _0 GLUCOSE 422* 283* 131*  BUN 32* 51* 54*  CREATININE 4.98* 7.29* 7.83*  CALCIUM 8.8* 9.1 8.4*  PHOS  --   --  6.1*   Liver Function Tests: Recent Labs  Lab 01/08/21 0452 01/10/21 0102  AST 22  --   ALT 18  --   ALKPHOS 196*  --   BILITOT 0.6  --   PROT 7.9  --   ALBUMIN 3.2* 3.0*   CBC: Recent Labs  Lab 01/08/21 0452 01/09/21 1628 01/10/21 0102  WBC 8.0 6.6 7.0  NEUTROABS 6.2 4.6  --   HGB 9.8* 9.4* 9.0*  HCT 30.7* 29.7* 27.9*  MCV 98.1 96.7 97.2  PLT 191 169 159    CBG: Recent Labs  Lab 01/09/21 2328 01/10/21 0000 01/10/21 0859  GLUCAP 132* 139* 102*      ROS: All others negative except those listed in HPI.  Physical Exam: Vitals:   01/10/21 0000 01/10/21 0010 01/10/21 0032 01/10/21 0838  BP: 103/78 107/70 101/71 127/64  Pulse:  76 75 72  Resp: 20 (!)  _1 Temp:  98.2 F (36.8 C) (!) 97.5 F (36.4 C) (!) 97.5 F (36.4 C)  TempSrc:   Oral Axillary  SpO2: 100% 100% 94% 98%  Weight:      Height:         General: Chronically ill appearing female sitting in bedside chair in NAD Head: NCAT sclera not icteric MMM Neck: Supple. No lymphadenopathy.  Lungs: CTA bilaterally. No wheeze, rales or rhonchi. Breathing is unlabored on 4L via [place]. Heart: RRR. No murmur, rubs or gallops.  Abdomen: soft, nontender, +BS  Lower extremities:1+ edema b/l LE, R hand dressed, R thumb amputated.  Neuro: AAOx3. Moves all extremities spontaneously. Psych:  Responds to questions appropriately with a normal affect. Dialysis Access: LU AVF +b/t  Dialysis Orders:  MWF - [facility]  4hrs, BFR 450, DFR 800,  EDW 111kg, 2K/ 2Ca  Access: RU AVF  Heparin 10,000 unit Mircera 50 mcg q2wks - last 12/2 Calcitriol 1/55mg PO qHD   Assessment/Plan:  Osteomyelitis of R 2nd digit - noted on xray.  s/p I&D yesterday by ortho.  On cefepime and vanc.  Cultures pending. Per notes  will likely need amputation.   ESRD -  on HD MWF.  Orders written for HD today off schedule due to missed treatment.  Resume regular schedule from 12/12.   Hypertension/volume  - Blood pressure well controlled.  On metoprolol 73m BID at home, will change inpatient dose.  Edema noted on exam with chronically large gains.  Stop IVF.  UF goal 4L today.    Anemia of CKD - Hgb 9.0.  ESA recently dosed.  Monitor labs.   Secondary Hyperparathyroidism -  Ca at goal.  Phos elevated.  Continue VDRA and binders.   Nutrition - Renal diet w/fluid restrictions.  Chronic Resp failure - on 4L O2 at home - at baseline DMT2 - per PMD  LJen Mow PA-C [facility] 01/10/2021, 9:32 AM   Nephrology attending: I have personally seen and examined patient and I agree with the assessment plan as outlined above.  ESRD on HD admitted with osteomyelitis of finger.  On antibiotics.  Plan  for HD today as he missed dialysis yesterday.  Continue current medication and monitor labs.  DKatheran James MD [facility].

## 2021-01-10 NOTE — Progress Notes (Addendum)
Family Medicine Teaching Service Daily Progress Note Intern Pager: [phone]  Patient name: [name] Medical record number: [id number] Date of birth: 1965/05/09 Age: 55 y.o. Gender: female  Primary Care Provider: Sandi Mariscal, MD Consultants: Manson Passey, surgery  Code Status: Full   Pt Overview and Major Events to Date:  12/9- admitted 12/9- OR with ortho for I&D of L index finger  Assessment and Plan: [name] is a 55 y.o. female presenting with osteomyelitis of L 2nd digit, pending amputation. PMH is significant for osteomyelitis of L thumb s/p amputation, ESRD on HD MWF, anemia T2DM, CAD, CHF, HLD, HTN, PVD, OSA.   Osteomyelitis of L 2nd digit  S/p I&D. Ortho following, anticipates amputation of L 2nd digit Tuesday or Thursday afternoon, ok to d/c with antibiotics for outpatient amputation - Follow-up blood and wound cultures -Continue cefepime and Vanco until cultures return - Tylenol scheduled - Oxycodone as needed for postop pain (has not needed in las 24 h)  ESRD on HD MWF Last dialysis yesterday. Next session on 12/12 - Nephrology following, appreciate recommendations   DM2 CBG this am 274. Yesterday CBG 131 - very sensitive SSI - consider adding back home insulin  - continue to monitor   Anemia of CKD Hgb 9.2. On ESA - monitor CBC   HTN BP this am ranging from 105-133/52-59  HLD  PVD - continue Crestor   Other active problems that need follow up: Needs pulm consult outpatient for pulmonary nodules. Will make appointment prior to discharge    FEN/GI: heart healthy/carb modified  PPx: SCDs though if not going back to OR soon will need ppx   Disposition: Possible d/c tomorrow   Subjective:  No acute events overnight. Patient endorses feeling well this morning and without any concerns or complaints   Objective: Temp:  [97.1 F (36.2 C)-98.8 F (37.1 C)] 98 F (36.7 C) (12/10 2023) Pulse Rate:  [69-77] 77 (12/10 2023) Resp:  [17-26] 17 (12/10  2023) BP: (101-150)/(51-78) 115/60 (12/10 2023) SpO2:  [91 %-100 %] 91 % (12/10 2023) Weight:  [116.9 kg-119.4 kg] 116.9 kg (12/10 1643) Physical Exam: General: alert, sitting on edge of bed, NAD Cardiovascular: RRR no murmurs Respiratory: CTAB normal WOB on RA  Abdomen: soft, non distended  Extremities: L 2nd digit with dressing and splint in place. No drainage appreciated   Laboratory: Recent Labs  Lab 01/08/21 0452 01/09/21 1628 01/10/21 0102  WBC 8.0 6.6 7.0  HGB 9.8* 9.4* 9.0*  HCT 30.7* 29.7* 27.9*  PLT 191 169 159   Recent Labs  Lab 01/08/21 0452 01/09/21 1628 01/10/21 0102  NA 130* 133* 135  K 5.0 4.9 5.0  CL 90* 92* 95*  CO2 26 28 26   BUN 32* 51* 54*  CREATININE 4.98* 7.29* 7.83*  CALCIUM 8.8* 9.1 8.4*  PROT 7.9  --   --   BILITOT 0.6  --   --   ALKPHOS 196*  --   --   ALT 18  --   --   AST 22  --   --   GLUCOSE 422* 283* 131*     Imaging/Diagnostic Tests:  None new  Shary Key, DO 01/10/2021, 10:54 PM PGY-2, [place] Intern pager: [phone], text pages welcome

## 2021-01-10 NOTE — Evaluation (Signed)
Occupational Therapy Evaluation Patient Details Name: [name] MRN: [id number] DOB: 1965/11/09 Today's Date: 01/10/2021   History of Present Illness 55 yo female presents to [facility] on 10/17 from HD with R knee pain since fall on 10/14. Worsening mental status and apnea in ED possibly due to narcotic overdose narcan administered, ETT 10/17-10/18, requiring bipap at night for retained CO2. CXR shows bilat infiltrates suspect PNA. AVF issues with early termination of HD on 10/19. Fall OOB 10/20.  PMH includes anxiety, RA, CAD, HF, ESRD on HD MWF, HTN, RLS, sleep apnea, thoracic ascending aortic aneurysm, DM II with retinopathy, obesity, tobacco abuse, L thumb amputation.   Clinical Impression   Pt reports at baseline living with son and husband who provider her 24hr S/A, she requires intermittent A for dressing limited by L thumb amputation, but is otherwise mod indep with ADLs per report. Using Rollator in home for mobility, endorsing recently +8 falls without significant injury. RW was utilized during this session for increased stability, but encouraged decreased WB through wrist/forearm at this time d/t wound. Pt overall with decreased strength, balance, safety and insight; Requiring consistently for min A for transition to and from standing and brief bouts of anterior/posterior stepping and pivot transfer for simulated toileting transfer. Pt will benefit from post acute OT at [facility] level and OT will continue to follow acutely.    Recommendations for follow up therapy are one component of a multi-disciplinary discharge planning process, led by the attending physician.  Recommendations may be updated based on patient status, additional functional criteria and insurance authorization.   Follow Up Recommendations  Home health OT    Assistance Recommended at Discharge Frequent or constant Supervision/Assistance  Functional Status Assessment     Equipment Recommendations  None recommended by OT     Recommendations for Other Services       Precautions / Restrictions Precautions Precautions: Other (comment) Precaution Comments: L ring finger bandage, awaiting WB status from MD Required Braces or Orthoses: Splint/Cast Splint/Cast: L 1st digit      Mobility Bed Mobility               General bed mobility comments: pt recieved in and returned to recliner    Transfers Overall transfer level: Needs assistance Equipment used: Rolling walker (2 wheels) Transfers: Sit to/from Stand;Bed to chair/wheelchair/BSC Sit to Stand: Min assist Stand pivot transfers: Min assist         General transfer comment: cues for orientation of RW to body, as well as decreased WB to L hand as OT awaiting WB status/restrictions from MD. impulsive with slight posterior lean noted during session      Balance Overall balance assessment: Mild deficits observed, not formally tested                                         ADL either performed or assessed with clinical judgement   ADL Overall ADL's : Needs assistance/impaired Eating/Feeding: Set up   Grooming: Set up;Sitting;Wash/dry hands;Wash/dry face   Upper Body Bathing: Minimal assistance;Sitting   Lower Body Bathing: Moderate assistance;Sit to/from stand       Lower Body Dressing: Moderate assistance;Sit to/from stand   Toilet Transfer: Minimal assistance;Stand-pivot;Rolling walker (2 wheels)   Toileting- Clothing Manipulation and Hygiene: Moderate assistance         General ADL Comments: currently pt requiring near functional self care baseline for ADLs  limited by grasp/coordianotoin since thumb amputation. decreased insight to potential limitations caused by recent procedure to L index finger. impulsvie with balance deficits leading to increased level of A for standing clothing and bathing tasks. as well as toielting     Museum/gallery curator      Pertinent Vitals/Pain Pain  Assessment: No/denies pain     Hand Dominance Right   Extremity/Trunk Assessment         Cervical / Trunk Assessment Cervical / Trunk Assessment: Kyphotic   Communication Communication Communication: No difficulties   Cognition Arousal/Alertness: Awake/alert Behavior During Therapy: WFL for tasks assessed/performed Overall Cognitive Status: Within Functional Limits for tasks assessed                                 General Comments: appears to lack insight to functional deficits and general health, pleasant and able to follow directions well     General Comments  splint and bandage noted to L surgical site. callouses to B hands,    Exercises     Shoulder Instructions      Home Living Family/patient expects to be discharged to:: Private residence Living Arrangements: Children;Spouse/significant other Available Help at Discharge: Available 24 hours/day;Family Type of Home: House Home Access: Stairs to enter     Home Layout: One level     Bathroom Shower/Tub: Occupational psychologist: Handicapped height Bathroom Accessibility: Yes   Home Equipment: Conservation officer, nature (2 wheels);Rollator (4 wheels);Shower seat;Grab bars - tub/shower;Wheelchair - manual;Hand held shower head   Additional Comments: pt endorses using RW/Rollator for in home mobility, w/c stays in car for community mobility. son works 3rd shift husband works 1st shift but she always has someone home with her      Prior Functioning/Environment Prior Level of Function : Needs assist;History of Falls (last six months)               ADLs Comments: husband and son have had to assist wtih dressing since thumb amputation for pulling pants/socks up and buttoning/zipping. but endorses indep with bathing.        OT Problem List: Decreased strength;Decreased activity tolerance;Impaired balance (sitting and/or standing);Decreased coordination;Decreased safety awareness;Decreased  knowledge of use of DME or AE;Decreased knowledge of precautions      OT Treatment/Interventions: Self-care/ADL training;Therapeutic exercise;Neuromuscular education;DME and/or AE instruction;Therapeutic activities;Balance training;Patient/family education    OT Goals(Current goals can be found in the care plan section) Acute Rehab OT Goals Patient Stated Goal: to get more help at home OT Goal Formulation: With patient Time For Goal Achievement: 01/23/21 Potential to Achieve Goals: Fair ADL Goals Pt Will Perform Upper Body Bathing: with modified independence;with adaptive equipment Pt Will Perform Lower Body Bathing: with modified independence;with adaptive equipment Pt Will Transfer to Toilet: with modified independence;squat pivot transfer Pt Will Perform Toileting - Clothing Manipulation and hygiene: with modified independence;sit to/from stand  OT Frequency: Min 2X/week   Barriers to D/C:            Co-evaluation              AM-PAC OT "6 Clicks" Daily Activity     Outcome Measure Help from another person eating meals?: A Little Help from another person taking care of personal grooming?: A Little Help from another person toileting, which includes using toliet, bedpan, or urinal?: A Lot Help from another  person bathing (including washing, rinsing, drying)?: A Little Help from another person to put on and taking off regular upper body clothing?: A Little Help from another person to put on and taking off regular lower body clothing?: A Lot 6 Click Score: 16   End of Session Equipment Utilized During Treatment: Rolling walker (2 wheels) Nurse Communication: Mobility status  Activity Tolerance: Patient tolerated treatment well Patient left: in chair;with nursing/sitter in room  OT Visit Diagnosis: Unsteadiness on feet (R26.81)                Time: 3414-4360 OT Time Calculation (min): 25 min Charges:  OT General Charges $OT Visit: 1 Visit OT Evaluation $OT Eval Low  Complexity: 1 Low OT Treatments $Self Care/Home Management : 8-22 mins  Sybol Morre OTR/L acute rehab services Office: [phone]

## 2021-01-11 DIAGNOSIS — D638 Anemia in other chronic diseases classified elsewhere: Secondary | ICD-10-CM

## 2021-01-11 DIAGNOSIS — M869 Osteomyelitis, unspecified: Secondary | ICD-10-CM

## 2021-01-11 LAB — RENAL FUNCTION PANEL
Albumin: 3 g/dL — ABNORMAL LOW (ref 3.5–5.0)
Anion gap: 13 (ref 5–15)
BUN: 40 mg/dL — ABNORMAL HIGH (ref 6–20)
CO2: 24 mmol/L (ref 22–32)
Calcium: 8.5 mg/dL — ABNORMAL LOW (ref 8.9–10.3)
Chloride: 97 mmol/L — ABNORMAL LOW (ref 98–111)
Creatinine, Ser: 5.89 mg/dL — ABNORMAL HIGH (ref 0.44–1.00)
GFR, Estimated: 8 mL/min — ABNORMAL LOW
Glucose, Bld: 274 mg/dL — ABNORMAL HIGH (ref 70–99)
Phosphorus: 4.3 mg/dL (ref 2.5–4.6)
Potassium: 4.7 mmol/L (ref 3.5–5.1)
Sodium: 134 mmol/L — ABNORMAL LOW (ref 135–145)

## 2021-01-11 LAB — CBC
HCT: 28.3 % — ABNORMAL LOW (ref 36.0–46.0)
Hemoglobin: 9.2 g/dL — ABNORMAL LOW (ref 12.0–15.0)
MCH: 31.9 pg (ref 26.0–34.0)
MCHC: 32.5 g/dL (ref 30.0–36.0)
MCV: 98.3 fL (ref 80.0–100.0)
Platelets: 168 10*3/uL (ref 150–400)
RBC: 2.88 MIL/uL — ABNORMAL LOW (ref 3.87–5.11)
RDW: 15.1 % (ref 11.5–15.5)
WBC: 5.9 10*3/uL (ref 4.0–10.5)
nRBC: 0 % (ref 0.0–0.2)

## 2021-01-11 LAB — GLUCOSE, CAPILLARY
Glucose-Capillary: 277 mg/dL — ABNORMAL HIGH (ref 70–99)
Glucose-Capillary: 355 mg/dL — ABNORMAL HIGH (ref 70–99)
Glucose-Capillary: 307 mg/dL — ABNORMAL HIGH (ref 70–99)
Glucose-Capillary: 320 mg/dL — ABNORMAL HIGH (ref 70–99)

## 2021-01-11 LAB — HEPATITIS B SURFACE ANTIBODY, QUANTITATIVE: Hep B S AB Quant (Post): 22.4 m[IU]/mL (ref >9.9)

## 2021-01-11 MED ORDER — INSULIN NPH (HUMAN) (ISOPHANE) 100 UNIT/ML ~~LOC~~ SUSP: 35.0000 [IU] | Freq: Every day | SUBCUTANEOUS | Status: DC

## 2021-01-11 MED ORDER — INSULIN ASPART 100 UNIT/ML IJ SOLN
0.0000 [IU] | Freq: Three times a day (TID) | INTRAMUSCULAR | Status: DC
  Administered 2021-01-11: 4 [IU] via SUBCUTANEOUS
  Administered 2021-01-11: 5 [IU] via SUBCUTANEOUS

## 2021-01-11 MED ORDER — CHLORHEXIDINE GLUCONATE CLOTH 2 % EX PADS
6.0000 | MEDICATED_PAD | Freq: Every day | CUTANEOUS | Status: DC
  Administered 2021-01-12: 6 via TOPICAL

## 2021-01-11 MED ORDER — INSULIN NPH (HUMAN) (ISOPHANE) 100 UNIT/ML ~~LOC~~ SUSP: 15.0000 [IU] | Freq: Every day | SUBCUTANEOUS | Status: DC

## 2021-01-11 MED ORDER — VANCOMYCIN HCL IN DEXTROSE 1-5 GM/200ML-% IV SOLN
1000.0000 mg | Freq: Once | INTRAVENOUS | Status: AC
  Administered 2021-01-11: 1000 mg via INTRAVENOUS
  Filled 2021-01-11: qty 200

## 2021-01-11 NOTE — Progress Notes (Addendum)
[name] Progress Note   Subjective:   Patient seen and examined in room.  Sitting in bedside chair.  Reports she did not sleep well last night due to learning about a nodule on her lung.  Reports bloody drainage overnight from index finger.  No other specific complaints.    Objective Vitals:   01/10/21 1722 01/10/21 2023 01/11/21 0512 01/11/21 0729  BP: (!) 105/52 115/60 132/65 (!) 145/67  Pulse: 75 77 64 65  Resp: 18 17 16 16   Temp: 97.8 F (36.6 C) 98 F (36.7 C) 98.2 F (36.8 C) 97.6 F (36.4 C)  TempSrc: Oral   Axillary  SpO2: 99% 91% 98% 98%  Weight:      Height:       Physical Exam General:chronically ill appearing female in NAD Heart:RRR, no mrg Lungs:CTAB, nml WOB on RA Abdomen:soft, NTND Extremities:1+ LE edema, chronic skin changes Dialysis Access: RU AVF +b/t   Filed Weights   01/09/21 1536 01/10/21 1303 01/10/21 1643  Weight: 111.1 kg 119.4 kg 116.9 kg    Intake/Output Summary (Last 24 hours) at 01/11/2021 1221 Last data filed at 01/11/2021 0500 Gross per 24 hour  Intake 220 ml  Output 2513 ml  Net -2293 ml    Additional Objective Labs: Basic Metabolic Panel: Recent Labs  Lab 01/09/21 1628 01/10/21 0102 01/11/21 0042  NA 133* 135 134*  K 4.9 5.0 4.7  CL 92* 95* 97*  CO2 28 26 24   GLUCOSE 283* 131* 274*  BUN 51* 54* 40*  CREATININE 7.29* 7.83* 5.89*  CALCIUM 9.1 8.4* 8.5*  PHOS  --  6.1* 4.3   Liver Function Tests: Recent Labs  Lab 01/08/21 0452 01/10/21 0102 01/11/21 0042  AST 22  --   --   ALT 18  --   --   ALKPHOS 196*  --   --   BILITOT 0.6  --   --   PROT 7.9  --   --   ALBUMIN 3.2* 3.0* 3.0*   CBC: Recent Labs  Lab 01/08/21 0452 01/09/21 1628 01/10/21 0102 01/11/21 0042  WBC 8.0 6.6 7.0 5.9  NEUTROABS 6.2 4.6  --   --   HGB 9.8* 9.4* 9.0* 9.2*  HCT 30.7* 29.7* 27.9* 28.3*  MCV 98.1 96.7 97.2 98.3  PLT 191 169 159 168   Blood Culture    Component Value Date/Time   SDES BLOOD LEFT ANTECUBITAL  01/10/2021 0102   SPECREQUEST  01/10/2021 0102    BOTTLES DRAWN AEROBIC AND ANAEROBIC Blood Culture adequate volume   CULT  01/10/2021 0102    NO GROWTH 1 DAY Performed at [facility], [address], [place], [place] [address]    REPTSTATUS PENDING 01/10/2021 0102   Recent Labs  Lab 01/10/21 1152 01/10/21 1718 01/10/21 2029 01/11/21 0723 01/11/21 1130  GLUCAP 188* 135* 219* 277* 355*   Medications:  ceFEPime (MAXIPIME) IV 1 g (01/10/21 2048)   [START ON 01/12/2021] vancomycin     vancomycin      acetaminophen  650 mg Oral Q6H   Chlorhexidine Gluconate Cloth  6 each Topical Q0600   ferric citrate  1,050 mg Oral TID WC   insulin aspart  0-6 Units Subcutaneous TID WC   insulin aspart protamine- aspart  35 Units Subcutaneous BID WC   metoprolol tartrate  25 mg Oral BID   nicotine  7 mg Transdermal Daily   pramipexole  0.5 mg Oral Daily   rosuvastatin  40 mg Oral Daily  Dialysis Orders: MWF - [facility]  4hrs, BFR 450, DFR 800,  EDW 111kg, 2K/ 2Ca   Access: RU AVF  Heparin 10,000 unit Mircera 50 mcg q2wks - last 12/2 Calcitriol 1/60mcg PO qHD     Assessment/Plan:  Osteomyelitis of R 2nd digit - noted on xray.  s/p I&D yesterday by ortho.  On cefepime and vanc.  Cultures with NGTD Plan for amputation later this week.   ESRD -  on HD MWF.  Plan for next HD 12/12.   Hypertension/volume  - Blood pressure mildly elevated this AM. On metoprolol 25mg  BID at home. Edema noted on exam with chronically large gains.  Stop IVF.  UF 2.5L yesterday, plan for increased goal of 4L with HD tomorrow.  5kg over dry weight if weights correct.  Anemia of CKD - Hgb 9.2.  ESA recently dosed.  Monitor labs. Hold ESA with concern for malignancy.  Secondary Hyperparathyroidism -  Ca and phos at goal. Continue VDRA and binders.   Nutrition - Renal diet w/fluid restrictions.  Chronic Resp failure - on 4L O2 at home - at baseline DMT2 - per PMD Pulmonary nodules -  concern for mets -  noted on CT in 11/2020.  PMD plans to arrange pulmonary appointment prior to d/c  Catherine Mow, PA-C [facility] 01/11/2021,12:21 PM  LOS: 1 day   Nephrology attending on Patient was seen and examined.  Chart reviewed.  I agree with above.  ESRD on HD status post HD yesterday, tolerated well with 2.5 L ultrafiltration.  Plan for next regular dialysis tomorrow.  Receiving antibiotics for osteomyelitis of right second digit and plan for amputation noted.  Patient is clinically stable.  Catherine James, MD [facility].

## 2021-01-12 ENCOUNTER — Other Ambulatory Visit: Payer: Self-pay | Admitting: Orthopedic Surgery

## 2021-01-12 ENCOUNTER — Other Ambulatory Visit (HOSPITAL_COMMUNITY): Payer: Self-pay

## 2021-01-12 LAB — CBC
HCT: 28 % — ABNORMAL LOW (ref 36.0–46.0)
Hemoglobin: 9.1 g/dL — ABNORMAL LOW (ref 12.0–15.0)
MCH: 31.1 pg (ref 26.0–34.0)
MCHC: 32.5 g/dL (ref 30.0–36.0)
MCV: 95.6 fL (ref 80.0–100.0)
Platelets: 175 10*3/uL (ref 150–400)
RBC: 2.93 MIL/uL — ABNORMAL LOW (ref 3.87–5.11)
RDW: 14.8 % (ref 11.5–15.5)
WBC: 5.3 10*3/uL (ref 4.0–10.5)
nRBC: 0 % (ref 0.0–0.2)

## 2021-01-12 LAB — AEROBIC/ANAEROBIC CULTURE W GRAM STAIN (SURGICAL/DEEP WOUND): Culture: NORMAL

## 2021-01-12 LAB — RENAL FUNCTION PANEL
Albumin: 3.2 g/dL — ABNORMAL LOW (ref 3.5–5.0)
Anion gap: 17 — ABNORMAL HIGH (ref 5–15)
BUN: 58 mg/dL — ABNORMAL HIGH (ref 6–20)
CO2: 20 mmol/L — ABNORMAL LOW (ref 22–32)
Calcium: 8.7 mg/dL — ABNORMAL LOW (ref 8.9–10.3)
Chloride: 95 mmol/L — ABNORMAL LOW (ref 98–111)
Creatinine, Ser: 7.2 mg/dL — ABNORMAL HIGH (ref 0.44–1.00)
GFR, Estimated: 6 mL/min — ABNORMAL LOW (ref >60)
Glucose, Bld: 170 mg/dL — ABNORMAL HIGH (ref 70–99)
Phosphorus: 5.1 mg/dL — ABNORMAL HIGH (ref 2.5–4.6)
Potassium: 5.6 mmol/L — ABNORMAL HIGH (ref 3.5–5.1)
Sodium: 132 mmol/L — ABNORMAL LOW (ref 135–145)

## 2021-01-12 LAB — GLUCOSE, CAPILLARY: Glucose-Capillary: 79 mg/dL (ref 70–99)

## 2021-01-12 MED ORDER — DOXYCYCLINE HYCLATE 100 MG PO TABS
100.0000 mg | ORAL_TABLET | Freq: Two times a day (BID) | ORAL | 0 refills | Status: AC
Start: 2021-01-12 — End: 2021-01-17
  Filled 2021-01-12: qty 10, 5d supply, fill #0

## 2021-01-12 MED ORDER — CEPHALEXIN 500 MG PO CAPS
500.0000 mg | ORAL_CAPSULE | Freq: Every day | ORAL | 0 refills | Status: AC
  Filled 2021-01-12: qty 5, 5d supply, fill #0

## 2021-01-12 NOTE — Progress Notes (Signed)
[name] to be D/C'd  per MD order.  Discussed with the patient and all questions fully answered.  VSS, Skin clean, dry and intact without evidence of skin break down, no evidence of skin tears noted.  IV catheter discontinued intact. Site without signs and symptoms of complications. Dressing and pressure applied.  An After Visit Summary was printed and given to the patient. Patient received prescription from [facility].  D/c RE-education completed with patient/family including follow up instructions, medication list, d/c activities limitations if indicated, with other d/c instructions as indicated by MD - patient able to verbalize understanding, all questions fully answered.   Patient instructed to return to ED, call 911, or call MD for any changes in condition.   Patient to be escorted via [place], and D/C home via private auto.

## 2021-01-12 NOTE — Progress Notes (Addendum)
Pt refuses to sign the dialysis consent. Pt stated she would want to get discharged today and get her dialysis done from outside.

## 2021-01-12 NOTE — Progress Notes (Signed)
Contacted by Dr Jonnie Finner that pt refused HD this am and was dc to home. Alberteen Spindle, Quarry manager, at [employer]. Renee aware pt did not receive treatment today and clinic will work with pt to make sure her HD needs are met.   Melven Sartorius Renal Navigator [phone]

## 2021-01-12 NOTE — Discharge Summary (Addendum)
[facility] Discharge Summary  Patient name: [name] Medical record number: [id number] Date of birth: 08/26/1965 Age: 55 y.o. Gender: female Date of Admission: 01/09/2021  Date of Discharge: 01/12/2021 Admitting Physician: Blane Ohara McDiarmid, MD  Primary Care Provider: Sandi Mariscal, MD Consultants: Orthopedic Surgery  Indication for Hospitalization: Osteomyelitis and Necrosis of L Index Finger  Discharge Diagnoses/Problem List:  Principal Problem:   Finger infection, left hand, second digit Active Problems:   Hypertension associated with diabetes ([place])   Type 2 diabetes mellitus with diabetic chronic kidney disease (HCC)   Diastolic dysfunction   Chronic pain disorder   End-stage renal disease on hemodialysis (HCC)   Morbid obesity (HCC)   Tobacco abuse   Possible osteomyelitis of finger of left hand (HCC)   BMI 37.0-37.9, adult   OSA (obstructive sleep apnea), concern for   Pulmonary nodule seen on imaging study   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  Past medical history significant for osteomyelitis of the left thumb status post amputation, ESRD on HD MWF, anemia, type 2 diabetes, CAD, CHF, HLD, HTN, PVD, OSA.  Hospital course as outlined below:   Osteomyelitis of L Index Finger [name] presented to the [facility] on 12/9 with 10 days of L index finger pain,  swelling, and purulent drainage. X ray obtained in the ED showed concern for osteomyelitis and orthopedic surgery recommended urgent surgical intervention. She was started on broad spectrum antibiotics and taken to the OR for an I&D. Deep wound cultures were obtained and did not grow anything for 2 days.  She was discharged in stable condition on 12/12 with plans for outpatient amputation of the finger per orthopedic surgery. She was discharged on doxycycline and Keflex for antibiotic coverage until definitive surgical cure could be achieved.   ESRD on HD MWF Nephrology was consulted and continue with hemodialysis while inpatient.  Type 2 diabetes Patient remained on her home regimen of Humalog 75/25 at 35 units twice daily with meals.   Pulmonary nodules on CT  Incidental finding on CTA chest 11/25/2020 which radiology interpretation mentions suspicion for metastatic disease given concern for renal cancer seen on 08/2019 CTAP.  Patient is a non-smoker.  Patient has not yet seen pulmonology.  Discussed case with pulmonology on 12/12 and she will follow-up with them in their office.  Chronic conditions that were medically managed/monitored: Hypertension, HLD, PVD, tobacco use disorder, anemia.   Items for PCP follow-up 1) Please ensure close follow-up with pulmonology for multiple lung nodules 2) Please ensure follow-up with ortho surgery s/p amputation 3) Patient has multiple risk factors for recurrent finger infections. Namely fingernail biting and immunosuppression on adalimumab. Consider behavioral intervention  for nailbiting and/or discontinuation of adalimumab 4) Patient would benefit from both tobacco cessation and weight loss. Consider primary-care based interventions for both of these.    Significant Procedures: I&D of L index finger, 12/09. Dr. Fredna Dow, orthopedic surgery  Significant Labs and Imaging:  Recent Labs  Lab 01/10/21 0102 01/11/21 0042 01/12/21 0100  WBC 7.0 5.9 5.3   HGB 9.0* 9.2* 9.1*  HCT 27.9* 28.3* 28.0*  PLT 159 168 175   Recent Labs  Lab 01/08/21 0452 01/09/21 1628 01/10/21 0102 01/11/21 0042 01/12/21 0100  NA 130* 133* 135 134* 132*  K 5.0 4.9 5.0 4.7 5.6*  CL 90* 92* 95* 97* 95*  CO2 26 28 26 24  20*  GLUCOSE 422* 283* 131* 274* 170*  BUN 32* 51* 54* 40* 58*  CREATININE 4.98* 7.29* 7.83* 5.89* 7.20*  CALCIUM 8.8* 9.1 8.4* 8.5* 8.7*  PHOS  --   --  6.1* 4.3 5.1*  ALKPHOS 196*  --   --   --   --   AST 22  --   --   --   --   ALT 18  --   --   --   --   ALBUMIN 3.2*  --  3.0* 3.0* 3.2*   DG Hand Complete Left CLINICAL DATA:  55 year old female with pain and purulent drainage from the left index finger.  EXAM: LEFT HAND - COMPLETE 3+ VIEW  COMPARISON:  No priors.  FINDINGS: Patient is status post amputation of the first digit beyond the base of the proximal phalanx. There is a moth-eaten appearance of the second distal phalanx with irregularity in the overlying soft tissues and some soft tissue gas, highly concerning for osteomyelitis. Remaining bones of the left hand are otherwise unremarkable in appearance.  IMPRESSION: 1. Findings are highly concerning for acute osteomyelitis of the left second distal phalanx, as above. 2. Status post amputation of the left first digit beyond the base of the proximal phalanx.  Electronically Signed   By: Vinnie Langton M.D.   On: 01/08/2021 05:18   Results/Tests Pending at Time of Discharge: None  Discharge Medications:  Allergies as of 01/12/2021       Reactions   Dilaudid [hydromorphone] Other (See Comments)   Apnea, required intubation   Bupropion Itching        Medication List     STOP taking these medications    FISH OIL PO       TAKE these medications    acetaminophen 500 MG tablet Commonly known as: TYLENOL Take 500-1,000 mg by mouth daily as needed for headache (pain).   albuterol 108 (90 Base) MCG/ACT inhaler Commonly known as: VENTOLIN  HFA Inhale 2 puffs into the lungs every 6 (six) hours as needed for wheezing or shortness of breath.   Auryxia 1 GM 210 MG(Fe) tablet Generic drug: ferric citrate Take 840-1,050 mg by mouth See admin instructions. Take 5 tablets (1050 mg) by mouth twice daily with meals, occasionally take 4 tablets (840 mg) with a snack   b complex-vitamin c-folic acid 0.8 MG Tabs tablet Take 1 tablet by mouth every Monday, Wednesday, and Friday with hemodialysis.   cephALEXin 500 MG capsule Commonly known as: KEFLEX Take 1 capsule (500 mg total) by mouth daily for 5 days. On HD days, take if after HD session.   doxycycline 100 MG tablet Commonly known as: VIBRA-TABS Take 1 tablet (100 mg total) by mouth 2 (two) times daily for 5 days.   Humira Pen 40 MG/0.4ML Pnkt Generic drug: Adalimumab Inject 40 mg into the skin every 14 (fourteen) days.   hydrOXYzine 25 MG tablet Commonly known as: ATARAX Take 25 mg by mouth every morning.   Insulin Lispro Prot & Lispro (75-25) 100  UNIT/ML Kwikpen Commonly known as: HUMALOG 75/25 MIX Inject 35 Units into the skin 2 (two) times daily with a meal. What changed: when to take this   metoprolol succinate 100 MG 24 hr tablet Commonly known as: TOPROL-XL Take 100 mg by mouth daily. Take with or immediately following a meal.   MIRCERA IJ Dialysis Monday,Wednesday and friday   Narcan 4 MG/0.1ML Liqd nasal spray kit Generic drug: naloxone Place 1 spray into the nose as needed (accidental overdose.).   OneTouch Verio test strip Generic drug: glucose blood 1 each by Other route 2 (two) times daily. And lancets 2/day   OVER THE COUNTER MEDICATION Take 1 capsule by mouth every morning. Omega XL   oxyCODONE-acetaminophen 10-325 MG tablet Commonly known as: PERCOCET Take 1 tablet by mouth 3 (three) times daily as needed for pain.   pramipexole 0.5 MG tablet Commonly known as: MIRAPEX Take 0.5 mg by mouth daily.   rosuvastatin 40 MG tablet Commonly known  as: CRESTOR TAKE 1 TABLET BY MOUTH DAILY *PATIENT NEEDS APPOINTMENT* What changed: See the new instructions.   VITAMIN C PO Take 1 tablet by mouth See admin instructions. At dialysis Monday,Wednesday and friday   ZINC PO Take 1 tablet by mouth every morning.               Discharge Care Instructions  (From admission, onward)           Start     Ordered   01/12/21 0000  Discharge wound care:       Comments: Per wound care instructions   01/12/21 0809            Discharge Instructions: Please refer to Patient Instructions section of EMR for full details.  Patient was counseled important signs and symptoms that should prompt return to medical care, changes in medications, dietary instructions, activity restrictions, and follow up appointments.   Follow-Up Appointments:   Eppie Gibson, MD 01/12/2021, 9:02 AM PGY-1, [place]

## 2021-01-12 NOTE — Progress Notes (Signed)
FPTS Brief Progress Note  S: Patient seen at bedside for evening rounds. She has no specific complaints. Reports her pain is well-controlled. Wondering if they've set an official date for her next surgery.  Spoke with primary RN, Prescott Parma, who does not have any concerns at this time. Of note, patient was briefly placed on oxygen via nasal cannula earlier this evening because nursing staff felt she had increased work of breathing. No desaturations noted.  Patient denies shortness of breath. Nasal cannula was around her neck during our encounter.  O: BP (!) 149/61 (BP Location: Left Arm)   Pulse 66   Temp 98.2 F (36.8 C)   Resp 20   Ht 5\' 8"  (1.727 m)   Wt 116.9 kg   LMP  (LMP Unknown) Comment: LMP Feb 2011  SpO2 93%   BMI 39.19 kg/m   Gen: alert, no acute distress Resp: normal work of breathing on room air L hand: s/p amputation of 1st digit. Dressing in place on 2nd digit with small amount of blood noted through dressing.  A/P: Osteomyelitis of L 2nd Digit Patient notes dressing has not been changed since her I&D and there is some oozing blood noted. -Plan for dressing change tomorrow am -Continue vanc/cefepime, awaiting culture results -Amputation later this week per ortho  Remainder per day team progress note.  - Orders reviewed. Labs for AM ordered, which was adjusted as needed.    Alcus Dad, MD 01/12/2021, 12:01 AM PGY-2, [facility] Night Resident  Please page [phone] with questions.

## 2021-01-12 NOTE — Discharge Instructions (Addendum)
You were hospitalized due to osteomyelitis of your left index finger, orthopedic surgery is planning for for your surgery this week and will let you know of the official date. You were given IV antibiotics and are being sent home with 2 antibiotics (Keflex and Doxycyline), which you will take these medications until you see your orthopedist, who will determine if you need to stay on these medications.    We have also contacted pulmonology and recommend that you follow-up with them outpatient as you were found to have lung nodules on imaging that are new since last year and it will be important to have these further evaluated.   Please also make sure to follow-up with Dr. Alfredo Bach office in the next few weeks.

## 2021-01-12 NOTE — Progress Notes (Signed)
Pt continues to refuse dialysis today regardless of explaining the importance of dialysis done today basing on her blood works., MD on call aware. Hemodialysis notified.

## 2021-01-13 ENCOUNTER — Telehealth: Payer: Self-pay | Admitting: Nephrology

## 2021-01-13 LAB — CULTURE, BLOOD (SINGLE)
Culture: NO GROWTH
Special Requests: ADEQUATE

## 2021-01-13 NOTE — Telephone Encounter (Signed)
Transition of care contact from inpatient facility  Date of discharge: 01/12/21 Date of contact: 01/13/21 Method: Phone Spoke to: Patient  Patient contacted to discuss transition of care from recent inpatient hospitalization. Patient was admitted to [facility] from [facility]12/09/22/ to 01/12/21. with discharge diagnosis of Left Second Finger Necrosis and Osteomyelitis   Medication changes were reviewed.  Patient will follow up with his/her outpatient HD unit on: 01/14/21  [facility]

## 2021-01-14 ENCOUNTER — Telehealth: Payer: Self-pay

## 2021-01-14 ENCOUNTER — Encounter (HOSPITAL_COMMUNITY): Payer: Self-pay | Admitting: Orthopedic Surgery

## 2021-01-14 ENCOUNTER — Other Ambulatory Visit: Payer: Self-pay

## 2021-01-14 LAB — CULTURE, BLOOD (ROUTINE X 2)
Culture: NO GROWTH
Special Requests: ADEQUATE

## 2021-01-14 NOTE — Progress Notes (Signed)
Unable to reach pt and family after multiple calls. LVM on sister Patricia's line with arrival time of 0730, surgery 10am-10:45am. Nothing to eat after midnight, and may have clear liquids until 0700. Take theses medicines: Atarax, Toprol, Crestor, Mirapex. As needed: Albuterol Inhaler, Tylenol, Percocet. Hold Novolog 75/25 insulin in the morning. Call back number left, as well.

## 2021-01-14 NOTE — Telephone Encounter (Signed)
ATC patient to get her scheduled for nodule consult per Dr. Tamala Julian line just rang and rang. Will route to front pool

## 2021-01-14 NOTE — Progress Notes (Signed)
Anesthesia Chart Review: [name]  Case: [id number] Date/Time: 01/15/21 0945   Procedure: AMPUTATION OF DIGIT LEFT INDEX FINGER (Left)   Anesthesia type: Choice   Pre-op diagnosis: NECROTIC LEFT INDEX FINGER   Location: [place] OR ROOM 21 / [place] OR   Surgeons: Leanora Cover, MD       DISCUSSION: Patient is a 55 year old female scheduled for the above procedure.  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 Tresa Moore), COVID-19 (09/14/18).   - [facility] admission 01/09/21-01/12/21 for osteomyelitis and necrosis of left index finger. Orthopedics consulted. She was treated with IV broad spectrum antibiotics and taken to the OR for I&D on 01/09/2021.  Deep wound culture showed few normal skin flora, moderate bacteroides fragilis, beta lactamase positive with no Group A strop or Staph aureus. Outpatient amputation by orthopedic hand surgery planned.  She was discharged home on doxycycline and Keflex in the meantime. 01/10/21 blood cultures negative x 4 days as of 01/14/21. Nephrology managed inpatient hemodialysis.  Patient had not yet seen pulmonary following abnormal chest CTA from October, so they were contacted to schedule out-patient follow-up.  - [facility] admission 11/17/20-11/27/20 following fall with right knee pain since 11/14/20. She had not been able to complete hemodialysis due ot pain. She was given Dilaudid 2 mg but developed period of apnea,  hypotension, and hypoxia that did not respond initially to Narcan. She was intubated for airway protection and required pressor support. She was treated for possible aspiration pneumonia. Extubated 11/18/20 to BiPAP. Nephrology consulted for HD. She had an abnormal CT of the chest on 11/25/20 which showed multiple scattered lung nodules in setting of RA and known right renal tumor. Case was discussed with oncologist Dr. Irene Limbo and referred back to urology and also to pulmonology for follow-up. Patient improved on therapies and with dialysis. Discharged home with home O2.   She is followed by Dr. 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 Had moderate to severe MR on 10/2018 echo, but only mild-moderate MR on 11/2020 echo. Last office visit 12/20/18.  Anesthesia team to evaluate on the day of surgery.   VS:  BP Readings from Last 3 Encounters:  01/12/21 (!) 154/79  01/08/21 119/68  11/27/20 121/66   Pulse Readings from Last 3 Encounters:  01/12/21 71  01/08/21 93  11/27/20 70     PROVIDERS: Sandi Mariscal, MD is PCP - Alexis Frock, MD is urologist - Minus Breeding, MD is cardiologist. Last evaluation 12/20/18. No further CV tested indicated at that time for CAD. She had previously been unable to complete MRI, so he hoped she can tolerate a cardiac MRI in the future under sedation; however, she did not have to have it prior to undergoing thumb amputation (he cleared her at acceptable risk for that procedure). Repeat CTA in one year for TAA surveillance recommended. He also encouraged her to follow-up with nephrology about renal/splenic masses on her October and September CTA  scans. Six month cardiology follow-up recommended.  [facility] Pulmonology staff contacted her on 01/14/21  in attempts to scheduled follow-up for pulmonology nodules. She saw Sherrilyn Rist, MD  for OSA in August 2022. Melida Quitter, MD is ENT   LABS: For day of surgery as indicated. At minimum she will need an ISTAT or equivalent. Currently, most recent lab results include: Lab Results  Component Value Date   WBC 5.3 01/12/2021   HGB 9.1 (L) 01/12/2021   HCT 28.0 (L) 01/12/2021   PLT 175 01/12/2021   GLUCOSE 170 (H) 01/12/2021   ALT 18 01/08/2021   AST 22 01/08/2021   NA 132 (L) 01/12/2021   K 5.6 (H) 01/12/2021   CL 95 (L) 01/12/2021   CREATININE 7.20 (H) 01/12/2021   BUN 58 (H) 01/12/2021   CO2 20 (L) 01/12/2021   TSH 1.380 11/18/2020   HGBA1C 9.7 (A) 05/01/2020     Sleep Study 10/23/20: IMPRESSIONS - Mild Obstructive Sleep apnea(OSA) - EKG showed no cardiac abnormalities. - Mild Oxygen Desaturation - The patient snored with moderate snoring volume. - No significant periodic leg movements(PLMs) during sleep. However, no significant associated arousals. - Study was ended early secondary to patient not been able to sleep Recommendation: Optimize sleep hygiene Avoid supine sleep as possible   IMAGES: Xray left hand 01/08/21: IMPRESSION: 1. Findings are highly concerning for acute osteomyelitis of the left second distal phalanx, as above. 2. Status post amputation of the left first digit beyond the base of the proximal phalanx.   CTA Chest 11/25/20 (ordered by Barb Merino, MD): IMPRESSION: 1. No evidence of acute pulmonary embolism or other acute vascular findings. 2. Cardiomegaly with central enlargement of the pulmonary arteries consistent with pulmonary arterial hypertension. 3. New bilateral pulmonary nodules, suspicious for metastatic disease in this patient with a history of urologic cancer per previous PET-CT. Prior studies have also demonstrated a mass in the upper pole of the right kidney suspicious for renal cell carcinoma. 4. Underlying chronic mosaic attenuation of the lungs which may reflect edema or hypersensitivity pneumonitis. 5.  Generalized osteosclerosis, likely related to chronic renal failure.   1V PCXR 11/25/20: FINDINGS: No significant change in AP portable chest radiograph with cardiomegaly and diffuse bilateral interstitial opacity. Possible small layering pleural effusions. No new or focal airspace opacity. IMPRESSION: No significant change in AP portable chest radiograph with cardiomegaly and diffuse bilateral interstitial opacity, likely reflecting edema. Possible small layering pleural effusions.     EKG: 11/17/20: Sinus rhythm Multiple premature complexes, vent & supraven IVCD, consider atypical RBBB Probable lateral infarct, age indeterminate No significant change since prior 1/22 Confirmed by Aletta Edouard [phone]) on 11/17/2020 9:28:06 PM   CV: Echo 11/25/20: IMPRESSIONS   1. Left ventricular ejection fraction, by estimation, is 60 to 65%. The  left ventricle has normal function. The left ventricle has no regional  wall motion abnormalities. There is moderate left ventricular hypertrophy.  Left ventricular diastolic  parameters are consistent with Grade II diastolic dysfunction  (pseudonormalization). There is the interventricular septum is flattened  in systole, consistent with right ventricular pressure overload.   2. Right ventricular systolic function is mildly reduced. The right  ventricular size is mildly enlarged. There is moderately elevated  pulmonary artery systolic pressure. The estimated right ventricular  systolic pressure is 60.1 mmHg.   3. Left atrial size was severely dilated.   4. Right atrial size was severely dilated.   5. The mitral valve is grossly normal. Mild to moderate mitral  valve  regurgitation. No evidence of mitral stenosis.   6. The aortic valve is tricuspid. There is mild calcification of the  aortic valve. Aortic valve regurgitation is not visualized. Mild aortic  valve sclerosis is present, with no evidence of aortic valve stenosis.   7. The  inferior vena cava is dilated in size with <50% respiratory  variability, suggesting right atrial pressure of 15 mmHg.  Comparison(s): No significant change from prior study.  (Comparison echo 10/23/18: LVEF 55-60%, moderate-severe MR, moderately elevated PASP, RVSP 40.9 mmHg; echo 06/29/17: LVEF 50-55%, grade 1 DD, mild MR, PA peak pressure: 41 mm Hg)      MR Cardiac 07/31/18: IMPRESSION: 1.  Normal biventricular chamber size and function. 2. Asymmetric septal hypertrophy, 19 mm at the LV basal anteroseptum and anterior wall, and 15 mm at the LV mid inferoseptum. No significant flow dephasing to suggest LV outflow tract obstruction. No evidence of systolic anterior motion of the mitral valve. These findings in combination could suggest a variant morphology of hypertrophic cardiomyopathy. 3. Gadolinium contrast was not administered due to end stage renal disease. Unable to assess for burden of delayed enhancement in the evaluation of possible hypertrophic cardiomyopathy or other cardiomyopathic process.     CT Coronary with FFR 07/08/17: FINDINGS: Aorta:  Normal size.  Mild diffuse calcifications.  No dissection. Aortic Valve:  Trileaflet.  No calcifications. Coronary Arteries:  Normal coronary origin.  Right dominance. - RCA is a large dominant artery that gives rise to PDA and PLVB. There mild to moderate diffuse calcified plaque with maximum stenosis 50-69% in proximal to mid segment. PDA and PLA are poorly visualized secondary to patients size. - Left main is a large artery that gives rise to LAD and LCX arteries. Left main has no plaque. - LAD is a large vessel that gives rise to one diagonal artery. There is diffuse moderate calcified plaque with maximum stenosis of 50-69% in the mid segment. - D1 is a medium size artery with mild diffuse calcified plaque and maximum stenosis 25-50% in the proximal portion. - LCX is a medium size non-dominant artery that gives rise to one large  OM1 branch. There is only minimal calcified plaque. Other findings: - Normal pulmonary vein drainage into the left atrium. - Normal let atrial appendage without a thrombus. - Severely dilated pulmonary artery measuring 48 mm suggestive of pulmonary hypertension. IMPRESSION: 1. Coronary calcium score of 837. This was 79 percentile for age and sex matched control. 2. Normal coronary origin with right dominance. 3. Diffuse calcified plaque with moderate CAD in the proximal to mid RCA and mid LAD. Additional analysis with CT FFR will be submitted. Aggressive risk factor modification should be initiated. 4. Severely dilated pulmonary artery measuring 48 mm suggestive of pulmonary hypertension. (I do not see the FFR report, but cardiology notes indicate non-obstructive CAD with medication therapy recommended.)   Past Medical History:  Diagnosis Date   Acute encephalopathy 02/04/863   Acute metabolic encephalopathy 7/84/6962   Acute respiratory distress 10/23/2018   Acute respiratory failure with hypoxia (HCC) 09/26/2018   Anaphylactic shock, unspecified, initial encounter 09/04/2018   Anemia    Anxiety    Arthritis    "knees" "hands", "RA"   CAD (coronary artery disease)    Nonobstructive on CT 2019   CHF (congestive heart failure) ([place])    Chronic bilateral pleural effusions 10/23/2018   COVID-19 10/2018   COVID-19 virus detected 10/10/2018   COVID-19 virus infection 09/18/2018   Dyspnea    "  when I have too much fluid."   ESRD (end stage renal disease) ([place])    Dialysis [facility]- 3rd st   Gangrene ([place]) 09/18/2018   Headache(784.0)    Heart murmur    High cholesterol    History of blood transfusion    Hypertension    Mitral regurgitation    moderate to severe MR 10/2018 echo   Nerve pain    "they say I have L5 nerve damage; my lumbar"   Neuropathy 01/29/2011   04/2011 MRI L-spine:  L5-S1: Bulge/shallow broad-based protrusion greatest centrally and in the right posterior lateral  position. Minimal crowding of the  upper aspect of the S1 nerve root greater on the right. Left lateral disc osteophyte with mild encroachment upon but not  significant compression of the exiting left L5 nerve root.   05/2011: Dr. Sherwood Gambler ([facility]): DJD and mild disc bu   Pericardial effusion    Pneumonia    ; 11/16/2018- "touch of pneumonia" - seen in ED- 11/14/2018- on antibiotic. Has had it x 2   Pneumonia 10/10/2018   PVD (peripheral vascular disease) ([place])    Right leg stent in [place].  (No records)   Restless legs    Sleep apnea    does not use Cpap   Thoracic ascending aortic aneurysm    4.4 cm 11/14/18 CTA   Type II diabetes mellitus ([place])    Uterine leiomyoma 01/10/2021    Past Surgical History:  Procedure Laterality Date   AMPUTATION Left 12/27/2018   Procedure: LEFT THUMB REVISION AMPUTATION DIGIT;  Surgeon: Charlotte Crumb, MD;  Location: [facility];  Service: Orthopedics;  Laterality: Left;   AV FISTULA PLACEMENT Left    AV FISTULA PLACEMENT Right 03/12/2019   Procedure: INSERTION OF ARTERIOVENOUS (AV) GORE-TEX GRAFT ARM;  Surgeon: Elam Dutch, MD;  Location: [facility] OR;  Service: Vascular;  Laterality: Right;   AV FISTULA PLACEMENT Right 08/13/2019   Procedure: RIGHT UPPER ARM ARTERIOVENOUS (AV) FISTULA CREATION WITH BASILIC VEIN;  Surgeon: Elam Dutch, MD;  Location: [facility];  Service: Vascular;  Laterality: Right;   [place]; 1997   x 2   COLONOSCOPY     EYE SURGERY Bilateral    cataract surgery   I & D EXTREMITY Left 01/09/2021   Procedure: IRRIGATION AND DEBRIDEMENT INDEX FINGER;  Surgeon: Leanora Cover, MD;  Location: [facility];  Service: Orthopedics;  Laterality: Left;   INSERTION OF DIALYSIS CATHETER Right 09/26/2018   Procedure: INSERTION OF DIALYSIS CATHETER Right Internal Jugular.;  Surgeon: Elam Dutch, MD;  Location: [facility] OR;  Service: Vascular;  Laterality: Right;   IR DIALY SHUNT INTRO NEEDLE/INTRACATH INITIAL W/IMG RIGHT Right  11/21/2020   IR FLUORO GUIDE CV LINE RIGHT  05/04/2019   IR US GUIDE VASC ACCESS RIGHT  05/04/2019   IR US GUIDE VASC ACCESS RIGHT  11/21/2020   LIGATION OF ARTERIOVENOUS  FISTULA Left 09/26/2018   Procedure: LIGATION OF ARTERIOVENOUS  FISTULA LEFT ARM;  Surgeon: Elam Dutch, MD;  Location: [place];  Service: Vascular;  Laterality: Left;   PERICARDIAL WINDOW  02/2004   for pericardial effusion   PERIPHERAL VASCULAR INTERVENTION Right 10/26/2019   Procedure: PERIPHERAL VASCULAR INTERVENTION;  Surgeon: Elam Dutch, MD;  Location: [place] CV LAB;  Service: Cardiovascular;  Laterality: Right;  arm  AV fistula   TUBAL LIGATION  05/1995   UPPER EXTREMITY VENOGRAPHY N/A 06/22/2019   Procedure: UPPER EXTREMITY VENOGRAPHY - Right [place];  Surgeon: Ruta Hinds  E, MD;  Location: [place] CV LAB;  Service: Cardiovascular;  Laterality: N/A;    MEDICATIONS: No current facility-administered medications for this encounter.    acetaminophen (TYLENOL) 500 MG tablet   albuterol (VENTOLIN HFA) 108 (90 Base) MCG/ACT inhaler   Ascorbic Acid (VITAMIN C PO)   AURYXIA 1 GM 210 MG(Fe) tablet   b complex-vitamin c-folic acid (NEPHRO-VITE) 0.8 MG TABS tablet   cephALEXin (KEFLEX) 500 MG capsule   doxycycline (VIBRA-TABS) 100 MG tablet   glucose blood (ONETOUCH VERIO) test strip   HUMIRA PEN 40 MG/0.4ML PNKT   hydrOXYzine (ATARAX/VISTARIL) 25 MG tablet   Insulin Lispro Prot & Lispro (HUMALOG 75/25 MIX) (75-25) 100 UNIT/ML Kwikpen   Methoxy PEG-Epoetin Beta (MIRCERA IJ)   metoprolol succinate (TOPROL-XL) 100 MG 24 hr tablet   Multiple Vitamins-Minerals (ZINC PO)   NARCAN 4 MG/0.1ML LIQD nasal spray kit   OVER THE COUNTER MEDICATION   oxyCODONE-acetaminophen (PERCOCET) 10-325 MG tablet   pramipexole (MIRAPEX) 0.5 MG tablet   rosuvastatin (CRESTOR) 40 MG tablet    Myra Gianotti, PA-C Surgical Short Stay/Anesthesiology [facility] Phone [phone] [facility] Phone [phone] 01/14/2021 2:11  PM

## 2021-01-14 NOTE — Progress Notes (Signed)
PCP - Dr. Nancy Fetter  Cardiologist - Dr. Percival Spanish  EP- Denies  Endocrine- Denies  Pulm- Denies  Chest x-ray - 11/25/20 (E)  EKG - 11/17/20 (E)  Stress Test - Denies  ECHO - 11/25/20 (E)  Cardiac Cath - Denies  AICD-na PM-na LOOP-na  Dialysis- R- Arm access- M-W-F, had a full session 01/14/21  Sleep Study - Yes- Positive CPAP - Denies  LABS- 01/15/21: CBC, BMP  ASA- Denies  ERAS- Yes- clears until 0700  HA1C- 05/01/20 (E): 9.7 Fasting Blood Sugar - 76-305 Checks Blood Sugar ___2__ times a day  Anesthesia- Yes- recent hospital admit.  Pt denies having chest pain, sob, or fever during the pre-op phone call. All instructions explained to the pt, with a verbal understanding of the material including: as of today, stop taking all Aspirin (unless instructed by your doctor) and Other Aspirin containing products, Vitamins, Fish oils, and Herbal medications. Also stop all NSAIDS i.e. Advil, Ibuprofen, Motrin, Aleve, Anaprox, Naproxen, BC, Goody Powders, and all Supplements.   WHAT DO I DO ABOUT MY DIABETES MEDICATION?  Do not take Humalog 75/25 the morning of surgery.  If your CBG is greater than 220 mg/dL, you may take  of your sliding scale (correction) dose of insulin.   How do I manage my blood sugar before surgery? Check your blood sugar the morning of your surgery when you wake up and every 2 hours until you get to the Short Stay unit. If your blood sugar is less than 70 mg/dL, you will need to treat for low blood sugar: Do not take insulin. Treat a low blood sugar (less than 70 mg/dL) with  cup of clear juice (cranberry or apple), 4 glucose tablets, OR glucose gel. Recheck blood sugar in 15 minutes after treatment (to make sure it is greater than 70 mg/dL). If your blood sugar is not greater than 70 mg/dL on recheck, call [phone]  for further instructions. Report your blood sugar to the short stay nurse when you get to Short Stay.  Reviewed and Endorsed by [facility] Patient Education Committee, August 2015   Pt also instructed to wear a mask and social distance if she goes out. The opportunity to ask questions was provided.   Coronavirus Screening  Have you experienced the following symptoms:  Cough yes/no: No Fever (>100.33F)  yes/no: No Runny nose yes/no: No Sore throat yes/no: No Difficulty breathing/shortness of breath  yes/no: No  Have you or a family member traveled in the last 14 days and where? yes/no: No   If the patient indicates "YES" to the above questions, their PAT will be rescheduled to limit the exposure to others and, the surgeon will be notified. THE PATIENT WILL NEED TO BE ASYMPTOMATIC FOR 14 DAYS.   If the patient is not experiencing any of these symptoms, the PAT nurse will instruct them to NOT bring anyone with them to their appointment since they may have these symptoms or traveled as well.   Please remind your patients and families that hospital visitation restrictions are in effect and the importance of the restrictions.

## 2021-01-14 NOTE — Telephone Encounter (Signed)
-----   Message from Candee Furbish, MD sent at 01/12/2021  8:01 AM EST ----- Regarding: New multiple nodule appt Can be with anyone, will not be nav case.  Thanks Linna Hoff

## 2021-01-14 NOTE — Anesthesia Preprocedure Evaluation (Addendum)
Anesthesia Evaluation  Patient identified by MRN, date of birth, ID band Patient awake    Reviewed: Allergy & Precautions, NPO status , Patient's Chart, lab work & pertinent test results  Airway Mallampati: III  TM Distance: >3 FB Neck ROM: Full    Dental  (+) Missing   Pulmonary sleep apnea , Current SmokerPatient did not abstain from smoking.,    Pulmonary exam normal breath sounds clear to auscultation       Cardiovascular hypertension, Pt. on medications and Pt. on home beta blockers + CAD, + Peripheral Vascular Disease and +CHF  Normal cardiovascular exam+ Valvular Problems/Murmurs MR  Rhythm:Regular Rate:Normal  Left ventricular ejection fraction, by estimation, is 60 to 65%. The left ventricle has normal function. The left ventricle has no regional wall motion abnormalities. There is moderate left ventricular hypertrophy. Left ventricular diastolic parameters are consistent with Grade II diastolic dysfunction (pseudonormalization). There is the interventricular septum is flattened in systole, consistent with right ventricular pressure overload. 1. Right ventricular systolic function is mildly reduced. The right ventricular size is mildly enlarged. There is moderately elevated pulmonary artery systolic pressure. The estimated right ventricular systolic pressure is 24.8 mmHg. 2. 3. Left atrial size was severely dilated. 4. Right atrial size was severely dilated. The mitral valve is grossly normal. Mild to moderate mitral valve regurgitation. No evidence of mitral stenosis. 5. The aortic valve is tricuspid. There is mild calcification of the aortic valve. Aortic valve regurgitation is not visualized. Mild aortic valve sclerosis is present, with no evidence of aortic valve stenosis. 6. The inferior vena cava is dilated in size with <50% respiratory variability, suggesting right atrial pressure of 15 mmHg.   Neuro/Psych   Headaches, Anxiety    GI/Hepatic negative GI ROS, Neg liver ROS,   Endo/Other  diabetes, Insulin Dependent  Renal/GU ESRF and DialysisRenal disease     Musculoskeletal  (+) Arthritis ,   Abdominal (+) + obese,   Peds  Hematology  (+) anemia , HLD   Anesthesia Other Findings NECROTIC LEFT INDEX FINGER  Reproductive/Obstetrics                           Anesthesia Physical Anesthesia Plan  ASA: 3  Anesthesia Plan: General   Post-op Pain Management:    Induction: Intravenous  PONV Risk Score and Plan: 2 and Ondansetron and Treatment may vary due to age or medical condition  Airway Management Planned: LMA  Additional Equipment:   Intra-op Plan:   Post-operative Plan: Extubation in OR  Informed Consent: I have reviewed the patients History and Physical, chart, labs and discussed the procedure including the risks, benefits and alternatives for the proposed anesthesia with the patient or authorized representative who has indicated his/her understanding and acceptance.     Dental advisory given  Plan Discussed with: CRNA  Anesthesia Plan Comments: (Reviewed PAT note written 01/14/2021 by Myra Gianotti, PA-C. Regional anesthesia offered to and declined by patient )       Anesthesia Quick Evaluation

## 2021-01-15 ENCOUNTER — Encounter (HOSPITAL_COMMUNITY): Payer: Self-pay | Admitting: Orthopedic Surgery

## 2021-01-15 ENCOUNTER — Encounter (HOSPITAL_COMMUNITY)
Admission: RE | Disposition: A | Discharge status: Home / Self Care | Payer: Self-pay | Source: Home / Self Care | Attending: Orthopedic Surgery

## 2021-01-15 ENCOUNTER — Ambulatory Visit (HOSPITAL_COMMUNITY)
Admission: RE | Admit: 2021-01-15 | Discharge: 2021-01-15 | Disposition: A | Discharge status: Home / Self Care | Payer: Medicare Other | Attending: Orthopedic Surgery | Admitting: Orthopedic Surgery

## 2021-01-15 ENCOUNTER — Ambulatory Visit (HOSPITAL_COMMUNITY): Payer: Medicare Other | Admitting: Vascular Surgery

## 2021-01-15 DIAGNOSIS — Z6837 Body mass index (BMI) 37.0-37.9, adult: Secondary | ICD-10-CM | POA: Diagnosis not present

## 2021-01-15 DIAGNOSIS — D649 Anemia, unspecified: Secondary | ICD-10-CM | POA: Diagnosis not present

## 2021-01-15 DIAGNOSIS — E7800 Pure hypercholesterolemia, unspecified: Secondary | ICD-10-CM | POA: Diagnosis not present

## 2021-01-15 DIAGNOSIS — M1990 Unspecified osteoarthritis, unspecified site: Secondary | ICD-10-CM | POA: Insufficient documentation

## 2021-01-15 DIAGNOSIS — G4730 Sleep apnea, unspecified: Secondary | ICD-10-CM | POA: Insufficient documentation

## 2021-01-15 DIAGNOSIS — E785 Hyperlipidemia, unspecified: Secondary | ICD-10-CM | POA: Insufficient documentation

## 2021-01-15 DIAGNOSIS — I132 Hypertensive heart and chronic kidney disease with heart failure and with stage 5 chronic kidney disease, or end stage renal disease: Secondary | ICD-10-CM | POA: Diagnosis not present

## 2021-01-15 DIAGNOSIS — I509 Heart failure, unspecified: Secondary | ICD-10-CM | POA: Diagnosis not present

## 2021-01-15 DIAGNOSIS — F17210 Nicotine dependence, cigarettes, uncomplicated: Secondary | ICD-10-CM | POA: Diagnosis not present

## 2021-01-15 DIAGNOSIS — E1169 Type 2 diabetes mellitus with other specified complication: Secondary | ICD-10-CM | POA: Insufficient documentation

## 2021-01-15 DIAGNOSIS — M86142 Other acute osteomyelitis, left hand: Secondary | ICD-10-CM | POA: Insufficient documentation

## 2021-01-15 DIAGNOSIS — E1122 Type 2 diabetes mellitus with diabetic chronic kidney disease: Secondary | ICD-10-CM | POA: Diagnosis not present

## 2021-01-15 DIAGNOSIS — F419 Anxiety disorder, unspecified: Secondary | ICD-10-CM | POA: Diagnosis not present

## 2021-01-15 DIAGNOSIS — Z794 Long term (current) use of insulin: Secondary | ICD-10-CM | POA: Diagnosis not present

## 2021-01-15 DIAGNOSIS — E669 Obesity, unspecified: Secondary | ICD-10-CM | POA: Insufficient documentation

## 2021-01-15 DIAGNOSIS — R519 Headache, unspecified: Secondary | ICD-10-CM | POA: Diagnosis not present

## 2021-01-15 DIAGNOSIS — I2510 Atherosclerotic heart disease of native coronary artery without angina pectoris: Secondary | ICD-10-CM | POA: Diagnosis not present

## 2021-01-15 DIAGNOSIS — Z8616 Personal history of COVID-19: Secondary | ICD-10-CM | POA: Diagnosis not present

## 2021-01-15 DIAGNOSIS — E1151 Type 2 diabetes mellitus with diabetic peripheral angiopathy without gangrene: Secondary | ICD-10-CM | POA: Insufficient documentation

## 2021-01-15 DIAGNOSIS — N186 End stage renal disease: Secondary | ICD-10-CM | POA: Insufficient documentation

## 2021-01-15 DIAGNOSIS — Z992 Dependence on renal dialysis: Secondary | ICD-10-CM | POA: Insufficient documentation

## 2021-01-15 HISTORY — PX: AMPUTATION: SHX166

## 2021-01-15 LAB — POCT I-STAT, CHEM 8
BUN: 49 mg/dL — ABNORMAL HIGH (ref 6–20)
Calcium, Ion: 1 mmol/L — ABNORMAL LOW (ref 1.15–1.40)
Chloride: 94 mmol/L — ABNORMAL LOW (ref 98–111)
Creatinine, Ser: 5.7 mg/dL — ABNORMAL HIGH (ref 0.44–1.00)
Glucose, Bld: 324 mg/dL — ABNORMAL HIGH (ref 70–99)
HCT: 31 % — ABNORMAL LOW (ref 36.0–46.0)
Hemoglobin: 10.5 g/dL — ABNORMAL LOW (ref 12.0–15.0)
Potassium: 4.6 mmol/L (ref 3.5–5.1)
Sodium: 133 mmol/L — ABNORMAL LOW (ref 135–145)
TCO2: 27 mmol/L (ref 22–32)

## 2021-01-15 LAB — GLUCOSE, CAPILLARY
Glucose-Capillary: 327 mg/dL — ABNORMAL HIGH (ref 70–99)
Glucose-Capillary: 264 mg/dL — ABNORMAL HIGH (ref 70–99)
Glucose-Capillary: 240 mg/dL — ABNORMAL HIGH (ref 70–99)

## 2021-01-15 LAB — CULTURE, BLOOD (ROUTINE X 2)
Culture: NO GROWTH
Culture: NO GROWTH
Special Requests: ADEQUATE
Special Requests: ADEQUATE

## 2021-01-15 SURGERY — AMPUTATION DIGIT
Anesthesia: General | Laterality: Left

## 2021-01-15 MED ORDER — EPHEDRINE SULFATE-NACL 50-0.9 MG/10ML-% IV SOSY
PREFILLED_SYRINGE | INTRAVENOUS | Status: DC | PRN
  Administered 2021-01-15: 10 mg via INTRAVENOUS
  Administered 2021-01-15: 15 mg via INTRAVENOUS

## 2021-01-15 MED ORDER — ONDANSETRON HCL 4 MG/2ML IJ SOLN: 4.0000 mg | Freq: Once | INTRAMUSCULAR | Status: DC | PRN

## 2021-01-15 MED ORDER — VASOPRESSIN 20 UNIT/ML IV SOLN
INTRAVENOUS | Status: AC
  Filled 2021-01-15: qty 1

## 2021-01-15 MED ORDER — IPRATROPIUM-ALBUTEROL 0.5-2.5 (3) MG/3ML IN SOLN
3.0000 mL | RESPIRATORY_TRACT | Status: DC
  Administered 2021-01-15: 3 mL via RESPIRATORY_TRACT

## 2021-01-15 MED ORDER — MIDAZOLAM HCL 2 MG/2ML IJ SOLN
INTRAMUSCULAR | Status: AC
  Filled 2021-01-15: qty 2

## 2021-01-15 MED ORDER — PROPOFOL 10 MG/ML IV BOLUS
INTRAVENOUS | Status: DC | PRN
  Administered 2021-01-15: 150 mg via INTRAVENOUS
  Administered 2021-01-15: 50 mg via INTRAVENOUS

## 2021-01-15 MED ORDER — CHLORHEXIDINE GLUCONATE 0.12 % MT SOLN
15.0000 mL | OROMUCOSAL | Status: AC
  Filled 2021-01-15: qty 15

## 2021-01-15 MED ORDER — LIDOCAINE 2% (20 MG/ML) 5 ML SYRINGE
INTRAMUSCULAR | Status: DC | PRN
  Administered 2021-01-15: 60 mg via INTRAVENOUS

## 2021-01-15 MED ORDER — ALBUMIN HUMAN 5 % IV SOLN: INTRAVENOUS | Status: DC | PRN

## 2021-01-15 MED ORDER — 0.9 % SODIUM CHLORIDE (POUR BTL) OPTIME
TOPICAL | Status: DC | PRN
  Administered 2021-01-15: 1000 mL

## 2021-01-15 MED ORDER — INSULIN ASPART 100 UNIT/ML IJ SOLN: 5.0000 [IU] | Freq: Once | INTRAMUSCULAR | Status: DC

## 2021-01-15 MED ORDER — VASOPRESSIN 20 UNIT/ML IV SOLN
INTRAVENOUS | Status: DC | PRN
  Administered 2021-01-15: 1 [IU] via INTRAVENOUS
  Administered 2021-01-15: 2 [IU] via INTRAVENOUS
  Administered 2021-01-15: 1 [IU] via INTRAVENOUS

## 2021-01-15 MED ORDER — DEXAMETHASONE SODIUM PHOSPHATE 10 MG/ML IJ SOLN
INTRAMUSCULAR | Status: DC | PRN
  Administered 2021-01-15: 4 mg via INTRAVENOUS

## 2021-01-15 MED ORDER — FENTANYL CITRATE (PF) 100 MCG/2ML IJ SOLN: 25.0000 ug | INTRAMUSCULAR | Status: DC | PRN

## 2021-01-15 MED ORDER — PHENYLEPHRINE HCL (PRESSORS) 10 MG/ML IV SOLN
INTRAVENOUS | Status: DC | PRN
  Administered 2021-01-15 (×2): 120 ug via INTRAVENOUS
  Administered 2021-01-15: 40 ug via INTRAVENOUS
  Administered 2021-01-15: 120 ug via INTRAVENOUS
  Administered 2021-01-15 (×2): 80 ug via INTRAVENOUS

## 2021-01-15 MED ORDER — BUPIVACAINE HCL 0.25 % IJ SOLN
INTRAMUSCULAR | Status: DC | PRN
  Administered 2021-01-15: 10 mL

## 2021-01-15 MED ORDER — ACETAMINOPHEN 10 MG/ML IV SOLN: 1000.0000 mg | Freq: Once | INTRAVENOUS | Status: DC | PRN

## 2021-01-15 MED ORDER — SODIUM CHLORIDE 0.9 % IV SOLN: INTRAVENOUS | Status: DC

## 2021-01-15 MED ORDER — OXYCODONE HCL 5 MG PO TABS: ORAL_TABLET | ORAL | 0 refills | Status: DC

## 2021-01-15 MED ORDER — IPRATROPIUM-ALBUTEROL 0.5-2.5 (3) MG/3ML IN SOLN
RESPIRATORY_TRACT | Status: AC
  Filled 2021-01-15: qty 3

## 2021-01-15 MED ORDER — PROPOFOL 10 MG/ML IV BOLUS
INTRAVENOUS | Status: AC
  Filled 2021-01-15: qty 20

## 2021-01-15 MED ORDER — BUPIVACAINE HCL (PF) 0.25 % IJ SOLN
INTRAMUSCULAR | Status: AC
  Filled 2021-01-15: qty 30

## 2021-01-15 MED ORDER — FENTANYL CITRATE (PF) 250 MCG/5ML IJ SOLN
INTRAMUSCULAR | Status: DC | PRN
  Administered 2021-01-15: 50 ug via INTRAVENOUS
  Administered 2021-01-15: 25 ug via INTRAVENOUS

## 2021-01-15 MED ORDER — ONDANSETRON HCL 4 MG/2ML IJ SOLN
INTRAMUSCULAR | Status: DC | PRN
  Administered 2021-01-15: 4 mg via INTRAVENOUS

## 2021-01-15 MED ORDER — CHLORHEXIDINE GLUCONATE 0.12 % MT SOLN
OROMUCOSAL | Status: AC
  Administered 2021-01-15: 15 mL
  Filled 2021-01-15: qty 15

## 2021-01-15 MED ORDER — FENTANYL CITRATE (PF) 250 MCG/5ML IJ SOLN
INTRAMUSCULAR | Status: AC
  Filled 2021-01-15: qty 5

## 2021-01-15 MED ORDER — INSULIN ASPART 100 UNIT/ML IJ SOLN
INTRAMUSCULAR | Status: AC
  Administered 2021-01-15: 5 [IU] via SUBCUTANEOUS
  Filled 2021-01-15: qty 1

## 2021-01-15 MED ORDER — CEFAZOLIN SODIUM-DEXTROSE 2-4 GM/100ML-% IV SOLN
INTRAVENOUS | Status: AC
  Filled 2021-01-15: qty 100

## 2021-01-15 MED ORDER — CEFAZOLIN SODIUM-DEXTROSE 2-4 GM/100ML-% IV SOLN
2.0000 g | INTRAVENOUS | Status: AC
  Administered 2021-01-15: 2 g via INTRAVENOUS

## 2021-01-15 MED ORDER — INSULIN ASPART 100 UNIT/ML IJ SOLN: 5.0000 [IU] | Freq: Once | INTRAMUSCULAR | Status: AC

## 2021-01-15 SURGICAL SUPPLY — 39 items
BAG COUNTER SPONGE SURGICOUNT (BAG) ×3 IMPLANT
BAG SPNG CNTER NS LX DISP (BAG) ×1
BLADE LONG MED 31X9 (MISCELLANEOUS) ×3 IMPLANT
BNDG CMPR 9X4 STRL LF SNTH (GAUZE/BANDAGES/DRESSINGS) ×1
BNDG COHESIVE 1X5 TAN STRL LF (GAUZE/BANDAGES/DRESSINGS) ×3 IMPLANT
BNDG ESMARK 4X9 LF (GAUZE/BANDAGES/DRESSINGS) ×3 IMPLANT
BNDG GAUZE ELAST 4 BULKY (GAUZE/BANDAGES/DRESSINGS) IMPLANT
CORD BIPOLAR FORCEPS 12FT (ELECTRODE) ×3 IMPLANT
CUFF TOURN SGL QUICK 18X4 (TOURNIQUET CUFF) ×2 IMPLANT
CUFF TOURN SGL QUICK 24 (TOURNIQUET CUFF) ×2
CUFF TRNQT CYL 24X4X16.5-23 (TOURNIQUET CUFF) IMPLANT
DRSG XEROFORM 1X8 (GAUZE/BANDAGES/DRESSINGS) ×1 IMPLANT
DURAPREP 26ML APPLICATOR (WOUND CARE) ×2 IMPLANT
GAUZE SPONGE 4X4 12PLY STRL (GAUZE/BANDAGES/DRESSINGS) ×2 IMPLANT
GAUZE XEROFORM 1X8 LF (GAUZE/BANDAGES/DRESSINGS) ×3 IMPLANT
GLOVE SRG 8 PF TXTR STRL LF DI (GLOVE) ×2 IMPLANT
GLOVE SURG ENC MOIS LTX SZ7.5 (GLOVE) ×3 IMPLANT
GLOVE SURG UNDER POLY LF SZ8 (GLOVE) ×2
GOWN STRL REUS W/ TWL LRG LVL3 (GOWN DISPOSABLE) ×2 IMPLANT
GOWN STRL REUS W/ TWL XL LVL3 (GOWN DISPOSABLE) ×2 IMPLANT
GOWN STRL REUS W/TWL LRG LVL3 (GOWN DISPOSABLE) ×2
GOWN STRL REUS W/TWL XL LVL3 (GOWN DISPOSABLE) ×2
KIT BASIN OR (CUSTOM PROCEDURE TRAY) ×3 IMPLANT
KIT TURNOVER KIT B (KITS) ×3 IMPLANT
NDL HYPO 25GX1X1/2 BEV (NEEDLE) IMPLANT
NEEDLE HYPO 25GX1X1/2 BEV (NEEDLE) ×2 IMPLANT
NS IRRIG 1000ML POUR BTL (IV SOLUTION) ×3 IMPLANT
PACK ORTHO EXTREMITY (CUSTOM PROCEDURE TRAY) ×3 IMPLANT
PAD ARMBOARD 7.5X6 YLW CONV (MISCELLANEOUS) ×6 IMPLANT
PAD CAST 4YDX4 CTTN HI CHSV (CAST SUPPLIES) IMPLANT
PADDING CAST COTTON 4X4 STRL (CAST SUPPLIES) ×2
SPECIMEN JAR SMALL (MISCELLANEOUS) ×3 IMPLANT
SPLINT FINGER 2.25 911902 (SOFTGOODS) ×1 IMPLANT
SUT CHROMIC 6 0 PS 4 (SUTURE) IMPLANT
SUT MON AB 5-0 PS2 18 (SUTURE) ×1 IMPLANT
SUT VICRYL 4-0 PS2 18IN ABS (SUTURE) IMPLANT
SYR CONTROL 10ML LL (SYRINGE) ×1 IMPLANT
TOWEL GREEN STERILE (TOWEL DISPOSABLE) ×3 IMPLANT
UNDERPAD 30X36 HEAVY ABSORB (UNDERPADS AND DIAPERS) ×3 IMPLANT

## 2021-01-15 NOTE — Transfer of Care (Signed)
Immediate Anesthesia Transfer of Care Note  Patient: EVGENIA MERRIMAN  Procedure(s) Performed: AMPUTATION OF DIGIT LEFT INDEX FINGER (Left)  Patient Location: PACU  Anesthesia Type:General  Level of Consciousness: sedated  Airway & Oxygen Therapy: Patient Spontanous Breathing and Patient connected to face mask oxygen  Post-op Assessment: Report given to RN and Post -op Vital signs reviewed and stable  Post vital signs: Reviewed and stable  Last Vitals:  Vitals Value Taken Time  BP    Temp    Pulse 72 01/15/21 1152  Resp 16 01/15/21 1152  SpO2 90 % 01/15/21 1152  Vitals shown include unvalidated device data.  Last Pain:  Vitals:   01/15/21 0837  TempSrc:   PainSc: 0-No pain      Patients Stated Pain Goal: 2 (26/41/58 3094)  Complications: No notable events documented.

## 2021-01-15 NOTE — Anesthesia Procedure Notes (Signed)
Procedure Name: LMA Insertion Date/Time: 01/15/2021 10:50 AM Performed by: Clearnce Sorrel, CRNA Pre-anesthesia Checklist: Patient identified, Emergency Drugs available, Suction available and Patient being monitored Patient Re-evaluated:Patient Re-evaluated prior to induction Oxygen Delivery Method: Circle System Utilized Preoxygenation: Pre-oxygenation with 100% oxygen Induction Type: IV induction Ventilation: Mask ventilation without difficulty LMA: LMA inserted LMA Size: 4.0 Number of attempts: 1 Airway Equipment and Method: Bite block Placement Confirmation: positive ETCO2 Tube secured with: Tape Dental Injury: Teeth and Oropharynx as per pre-operative assessment

## 2021-01-15 NOTE — Discharge Instructions (Signed)

## 2021-01-15 NOTE — Op Note (Addendum)
NAME: [name] RECORD NO: [id number] DATE OF BIRTH: 03/07/1965 FACILITY: [facility] LOCATION: [facility] OR PHYSICIAN: Tennis Must, MD   OPERATIVE REPORT   DATE OF PROCEDURE: 01/15/21    PREOPERATIVE DIAGNOSIS: Left index finger necrosis   POSTOPERATIVE DIAGNOSIS: Left index finger necrosis   PROCEDURE: Amputation left index finger through middle phalanx   SURGEON:  Leanora Cover, M.D.   ASSISTANT: none   ANESTHESIA:  General   INTRAVENOUS FLUIDS:  Per anesthesia flow sheet.   ESTIMATED BLOOD LOSS:  Minimal.   COMPLICATIONS:  None.   SPECIMENS: Left index finger to pathology for gross exam   TOURNIQUET TIME:    Total Tourniquet Time Documented: Arm  (Left) - 21 minutes Total: Arm  (Left) - 21 minutes    DISPOSITION:  Stable to PACU.   INDICATIONS: 55 year old female with necrosis of distal aspect of left index finger.  She wishes to proceed with amputation.  Risks, benefits and alternatives of surgery were discussed including the risks of blood loss, infection, damage to nerves, vessels, tendons, ligaments, bone for surgery, need for additional surgery, complications with wound healing, continued pain, stiffness, potential need for further amputation.  She voiced understanding of these risks and elected to proceed.  OPERATIVE COURSE:  After being identified preoperatively by myself,  the patient and I agreed on the procedure and site of the procedure.  The surgical site was marked.  Surgical consent had been signed. She was given IV antibiotics as preoperative antibiotic prophylaxis. She was transferred to the operating room and placed on the operating table in supine position with the Left upper extremity on an arm board.  General anesthesia was induced by the anesthesiologist.  Left upper extremity was prepped and draped in normal sterile orthopedic fashion.  A surgical pause was performed between the surgeons, anesthesia, and operating room staff and all were in  agreement as to the patient, procedure, and site of procedure.  Tourniquet at the proximal aspect of the forearm was inflated to 250 mmHg after exsanguination of the arm with an Esmarch bandage.  A fishmouth incision was made over the middle phalanx to provide skin that appeared healthy enough for healing.  This was carried into subcutaneous tissues by spreading technique.  The radial and ulnar neurovascular bundles were identified.  The digital nerves were placed under traction bipolared and allowed to retract.  The digital arteries were treated with bipolar electrocautery.  The flexor and extensor tendons were transected.  The bone was amputated with the bone cutters.  The distal portion of the finger was removed.  The rongeurs were used to smooth out the distal portion of the bone.  Bipolar electrocautery was used to obtain hemostasis.  The wound was copiously irrigated with sterile saline.  Was closed with 5-0 Monocryl in a interrupted fashion.  Good tension-free apposition of soft tissues was obtained.  Digital block was performed with quarter percent plain Marcaine to aid in postoperative analgesia.  The wound was dressed with sterile Xeroform 4 x 4 and wrapped with a Coban dressing lightly.  An AlumaFoam splint was placed and wrapped lightly with Coban dressing.  The tourniquet was deflated at 21 minutes.  Fingertips were pink with brisk capillary refill after deflation of tourniquet.  The operative  drapes were broken down.  The patient was awoken from anesthesia safely.  She was transferred back to the stretcher and taken to PACU in stable condition.  I will see her back in the office in 1 week  for postoperative followup.  I will give her a prescription for oxycodone 5 mg 1 p.o. every 6 hours as needed pain dispense #15.   Leanora Cover, MD Electronically signed, 01/15/21

## 2021-01-15 NOTE — H&P (Addendum)
[name] is an 55 y.o. female.   Chief Complaint: finger necrosis HPI: 55 yo with necrosis distal aspect left index finger.  Had I&D last week.  She wishes to proceed with amputation of left index finger for necrosis.  Allergies:  Allergies  Allergen Reactions   Dilaudid [Hydromorphone] Other (See Comments)    Apnea, required intubation   Bupropion Itching    Past Medical History:  Diagnosis Date   Acute encephalopathy 06/02/8839   Acute metabolic encephalopathy 6/60/6301   Acute respiratory distress 10/23/2018   Acute respiratory failure with hypoxia ([place]) 09/26/2018   Anaphylactic shock, unspecified, initial encounter 09/04/2018   Anemia    Anxiety    Arthritis    "knees" "hands", "RA"   CAD (coronary artery disease)    Nonobstructive on CT 2019   CHF (congestive heart failure) ([place])    Chronic bilateral pleural effusions 10/23/2018   COVID-19 10/2018   COVID-19 virus detected 10/10/2018   COVID-19 virus infection 09/18/2018   Dyspnea    "when I have too much fluid."   ESRD (end stage renal disease) ([place])    Dialysis [facility] [address] [facility]   Gangrene ([place]) 09/18/2018   Headache(784.0)    Heart murmur    High cholesterol    History of blood transfusion    Hypertension    Mitral regurgitation    moderate to severe MR 10/2018 echo   Nerve pain    "they say I have L5 nerve damage; my lumbar"   Neuropathy 01/29/2011   04/2011 MRI L-spine:  L5-S1: Bulge/shallow broad-based protrusion greatest centrally and in the right posterior lateral position. Minimal crowding of the  upper aspect of the S1 nerve root greater on the right. Left lateral disc osteophyte with mild encroachment upon but not  significant compression of the exiting left L5 nerve root.   05/2011: Dr. Sherwood Gambler ([facility]): DJD and mild disc bu   Pericardial effusion    Pneumonia    ; 11/16/2018- "touch of pneumonia" - seen in ED- 11/14/2018- on antibiotic. Has had it x 2   Pneumonia 10/10/2018   PVD (peripheral  vascular disease) ([place])    Right leg stent in [place].  (No records)   Restless legs    Sleep apnea    does not use Cpap   Thoracic ascending aortic aneurysm    4.4 cm 11/14/18 CTA   Type II diabetes mellitus ([place])    Uterine leiomyoma 01/10/2021    Past Surgical History:  Procedure Laterality Date   AMPUTATION Left 12/27/2018   Procedure: LEFT THUMB REVISION AMPUTATION DIGIT;  Surgeon: Charlotte Crumb, MD;  Location: [place];  Service: Orthopedics;  Laterality: Left;   AV FISTULA PLACEMENT Left    AV FISTULA PLACEMENT Right 03/12/2019   Procedure: INSERTION OF ARTERIOVENOUS (AV) GORE-TEX GRAFT ARM;  Surgeon: Elam Dutch, MD;  [facility];  Service: Vascular;  Laterality: Right;   AV FISTULA PLACEMENT Right 08/13/2019   Procedure: RIGHT UPPER ARM ARTERIOVENOUS (AV) FISTULA CREATION WITH BASILIC VEIN;  Surgeon: Elam Dutch, MD;  Location: [place];  Service: Vascular;  Laterality: Right;   [place]; 1997   x 2   COLONOSCOPY     EYE SURGERY Bilateral    cataract surgery   I & D EXTREMITY Left 01/09/2021   Procedure: IRRIGATION AND DEBRIDEMENT INDEX FINGER;  Surgeon: Leanora Cover, MD;  Location: [place];  Service: Orthopedics;  Laterality: Left;   INSERTION OF DIALYSIS CATHETER Right 09/26/2018  Procedure: INSERTION OF DIALYSIS CATHETER Right Internal Jugular.;  Surgeon: Elam Dutch, MD;  Location: [facility] OR;  Service: Vascular;  Laterality: Right;   IR DIALY SHUNT INTRO NEEDLE/INTRACATH INITIAL W/IMG RIGHT Right 11/21/2020   IR FLUORO GUIDE CV LINE RIGHT  05/04/2019   IR US GUIDE VASC ACCESS RIGHT  05/04/2019   IR US GUIDE VASC ACCESS RIGHT  11/21/2020   LIGATION OF ARTERIOVENOUS  FISTULA Left 09/26/2018   Procedure: LIGATION OF ARTERIOVENOUS  FISTULA LEFT ARM;  Surgeon: Elam Dutch, MD;  Location: [place];  Service: Vascular;  Laterality: Left;   PERICARDIAL WINDOW  02/2004   for pericardial effusion   PERIPHERAL VASCULAR INTERVENTION Right 10/26/2019    Procedure: PERIPHERAL VASCULAR INTERVENTION;  Surgeon: Elam Dutch, MD;  Location: [place] [facility];  Service: Cardiovascular;  Laterality: Right;  arm  AV fistula   TUBAL LIGATION  05/1995   UPPER EXTREMITY VENOGRAPHY N/A 06/22/2019   Procedure: UPPER EXTREMITY VENOGRAPHY - Right [place];  Surgeon: Elam Dutch, MD;  Location: [place] [facility];  Service: Cardiovascular;  Laterality: N/A;    Family History: Family History  Problem Relation Age of Onset   Cancer Brother    Heart disease Father        Died age 55   Diabetes Father    Hyperlipidemia Father    Hypertension Father    Stroke Father    Diabetes Mother    Hypertension Mother    Stroke Mother    Dementia Mother    Diabetes Sister    Diabetes Brother    Hypertension Sister    Hypertension Brother     Social History:   reports that she has been smoking cigarettes. She has a 8.25 pack-year smoking history. She has never used smokeless tobacco. She reports that she does not drink alcohol and does not use drugs.  Medications: Medications Prior to Admission  Medication Sig Dispense Refill   acetaminophen (TYLENOL) 500 MG tablet Take 500-1,000 mg by mouth daily as needed for headache (pain).     albuterol (VENTOLIN HFA) 108 (90 Base) MCG/ACT inhaler Inhale 2 puffs into the lungs every 6 (six) hours as needed for wheezing or shortness of breath. 6.7 g 0   Ascorbic Acid (VITAMIN C PO) Take 1 tablet by mouth See admin instructions. At dialysis Monday,Wednesday and friday     AURYXIA 1 GM 210 MG(Fe) tablet Take 840-1,050 mg by mouth See admin instructions. Take 5 tablets (1050 mg) by mouth twice daily with meals, occasionally take 4 tablets (840 mg) with a snack     b complex-vitamin c-folic acid (NEPHRO-VITE) 0.8 MG TABS tablet Take 1 tablet by mouth every Monday, Wednesday, and Friday with hemodialysis.     cephALEXin (KEFLEX) 500 MG capsule Take 1 capsule (500 mg total) by mouth daily for 5 days. On HD days, take if  after HD session. 5 capsule 0   doxycycline (VIBRA-TABS) 100 MG tablet Take 1 tablet (100 mg total) by mouth 2 (two) times daily for 5 days. 10 tablet 0   HUMIRA PEN 40 MG/0.4ML PNKT Inject 40 mg into the skin every 14 (fourteen) days.     hydrOXYzine (ATARAX/VISTARIL) 25 MG tablet Take 25 mg by mouth every morning.     Insulin Lispro Prot & Lispro (HUMALOG 75/25 MIX) (75-25) 100 UNIT/ML Kwikpen Inject 35 Units into the skin 2 (two) times daily with a meal. (Patient taking differently: Inject 35 Units into the skin in the morning and  at bedtime.) 45 mL 3   Methoxy PEG-Epoetin Beta (MIRCERA IJ) Dialysis Monday,Wednesday and friday     metoprolol succinate (TOPROL-XL) 100 MG 24 hr tablet Take 100 mg by mouth daily. Take with or immediately following a meal.     Multiple Vitamins-Minerals (ZINC PO) Take 1 tablet by mouth every morning.     OVER THE COUNTER MEDICATION Take 1 capsule by mouth every morning. Omega XL     oxyCODONE-acetaminophen (PERCOCET) 10-325 MG tablet Take 1 tablet by mouth 3 (three) times daily as needed for pain.     pramipexole (MIRAPEX) 0.5 MG tablet Take 0.5 mg by mouth daily.   1   rosuvastatin (CRESTOR) 40 MG tablet TAKE 1 TABLET BY MOUTH DAILY *PATIENT NEEDS APPOINTMENT* (Patient taking differently: Take 40 mg by mouth daily.) 30 tablet 10   glucose blood (ONETOUCH VERIO) test strip 1 each by Other route 2 (two) times daily. And lancets 2/day 200 each 3   NARCAN 4 MG/0.1ML LIQD nasal spray kit Place 1 spray into the nose as needed (accidental overdose.).       Results for orders placed or performed during the hospital encounter of 01/15/21 (from the past 48 hour(s))  Glucose, capillary     Status: Abnormal   Collection Time: 01/15/21  7:57 AM  Result Value Ref Range   Glucose-Capillary 327 (H) 70 - 99 mg/dL    Comment: Glucose reference range applies only to samples taken after fasting for at least 8 hours.  I-STAT, chem 8     Status: Abnormal   Collection Time: 01/15/21   8:30 AM  Result Value Ref Range   Sodium 133 (L) 135 - 145 mmol/L   Potassium 4.6 3.5 - 5.1 mmol/L   Chloride 94 (L) 98 - 111 mmol/L   BUN 49 (H) 6 - 20 mg/dL   Creatinine, Ser 5.70 (H) 0.44 - 1.00 mg/dL   Glucose, Bld 324 (H) 70 - 99 mg/dL    Comment: Glucose reference range applies only to samples taken after fasting for at least 8 hours.   Calcium, Ion 1.00 (L) 1.15 - 1.40 mmol/L   TCO2 27 22 - 32 mmol/L   Hemoglobin 10.5 (L) 12.0 - 15.0 g/dL   HCT 31.0 (L) 36.0 - 46.0 %    No results found.    Blood pressure (!) 150/60, pulse 83, temperature 98.7 F (37.1 C), temperature source Oral, resp. rate 18, height 5' 8"  (1.727 m), weight 113 kg, SpO2 90 %.  General appearance: alert, cooperative, and appears stated age Head: Normocephalic, without obvious abnormality, atraumatic Neck: supple, symmetrical, trachea midline Extremities: Intact sensation and capillary refill all digits.  +epl/fpl/io.  Necrosis left index finger Pulses: 2+ and symmetric Skin: Skin color, texture, turgor normal. No rashes or lesions Neurologic: Grossly normal Incision/Wound: as above  Assessment/Plan Left index finger necrosis.  Plan amputation left index finger.  Risks, benefits, and alternatives of surgery have been discussed and the patient agrees with the plan of care.   Leanora Cover 01/15/2021, 8:48 AM

## 2021-01-16 ENCOUNTER — Encounter (HOSPITAL_COMMUNITY): Payer: Self-pay | Admitting: Orthopedic Surgery

## 2021-01-16 LAB — SURGICAL PATHOLOGY

## 2021-01-16 NOTE — Anesthesia Postprocedure Evaluation (Signed)
Anesthesia Post Note  Patient: [name]  Procedure(s) Performed: AMPUTATION OF DIGIT LEFT INDEX FINGER (Left)     Patient location during evaluation: PACU Anesthesia Type: General Level of consciousness: awake and alert Pain management: pain level controlled Vital Signs Assessment: post-procedure vital signs reviewed and stable Respiratory status: spontaneous breathing, nonlabored ventilation, respiratory function stable and patient connected to nasal cannula oxygen Cardiovascular status: blood pressure returned to baseline and stable Postop Assessment: no apparent nausea or vomiting Anesthetic complications: no   No notable events documented.  Last Vitals:  Vitals:   01/15/21 1335 01/15/21 1350  BP: 116/62 109/69  Pulse: 79 74  Resp: 20 19  Temp:  (!) 36.4 C  SpO2: 93% 90%    Last Pain:  Vitals:   01/15/21 1350  TempSrc:   PainSc: 0-No pain                 Belenda Cruise P Ouita Nish

## 2021-01-16 NOTE — Telephone Encounter (Signed)
Appointment scheduled 02/06/2021 2:30pm with Dr.Olalere. Nothing further needed.

## 2021-02-04 ENCOUNTER — Other Ambulatory Visit: Payer: Self-pay | Admitting: Nurse Practitioner

## 2021-02-04 DIAGNOSIS — I714 Abdominal aortic aneurysm, without rupture, unspecified: Secondary | ICD-10-CM

## 2021-02-06 ENCOUNTER — Ambulatory Visit: Payer: Medicare Other | Admitting: Pulmonary Disease

## 2021-03-10 ENCOUNTER — Telehealth: Payer: Self-pay | Admitting: Hematology

## 2021-03-10 NOTE — Telephone Encounter (Signed)
Sch per 2/7 inbasket,unable to leave message, mailed calendar

## 2021-03-19 ENCOUNTER — Other Ambulatory Visit: Payer: Self-pay | Admitting: Nurse Practitioner

## 2021-03-19 DIAGNOSIS — Z0000 Encounter for general adult medical examination without abnormal findings: Secondary | ICD-10-CM

## 2021-03-19 DIAGNOSIS — I714 Abdominal aortic aneurysm, without rupture, unspecified: Secondary | ICD-10-CM

## 2021-03-26 ENCOUNTER — Other Ambulatory Visit: Payer: Self-pay

## 2021-03-26 DIAGNOSIS — R591 Generalized enlarged lymph nodes: Secondary | ICD-10-CM

## 2021-03-27 ENCOUNTER — Other Ambulatory Visit: Payer: Self-pay

## 2021-03-27 ENCOUNTER — Inpatient Hospital Stay: Payer: Medicare Other | Attending: Hematology

## 2021-03-27 ENCOUNTER — Inpatient Hospital Stay (HOSPITAL_BASED_OUTPATIENT_CLINIC_OR_DEPARTMENT_OTHER): Payer: Medicare Other | Admitting: Hematology

## 2021-03-27 VITALS — BP 114/80 | HR 84 | Temp 98.1°F | Resp 18

## 2021-03-27 DIAGNOSIS — F17210 Nicotine dependence, cigarettes, uncomplicated: Secondary | ICD-10-CM | POA: Diagnosis not present

## 2021-03-27 DIAGNOSIS — R918 Other nonspecific abnormal finding of lung field: Secondary | ICD-10-CM

## 2021-03-27 DIAGNOSIS — M069 Rheumatoid arthritis, unspecified: Secondary | ICD-10-CM | POA: Diagnosis not present

## 2021-03-27 DIAGNOSIS — N289 Disorder of kidney and ureter, unspecified: Secondary | ICD-10-CM

## 2021-03-27 DIAGNOSIS — R591 Generalized enlarged lymph nodes: Secondary | ICD-10-CM

## 2021-03-27 DIAGNOSIS — Z85528 Personal history of other malignant neoplasm of kidney: Secondary | ICD-10-CM | POA: Diagnosis not present

## 2021-03-27 DIAGNOSIS — E1165 Type 2 diabetes mellitus with hyperglycemia: Secondary | ICD-10-CM | POA: Diagnosis not present

## 2021-03-27 LAB — CBC WITH DIFFERENTIAL (CANCER CENTER ONLY)
Abs Immature Granulocytes: 0.06 10*3/uL (ref 0.00–0.07)
Basophils Absolute: 0 10*3/uL (ref 0.0–0.1)
Basophils Relative: 1 %
Eosinophils Absolute: 0.2 10*3/uL (ref 0.0–0.5)
Eosinophils Relative: 3 %
HCT: 30.9 % — ABNORMAL LOW (ref 36.0–46.0)
Hemoglobin: 9.8 g/dL — ABNORMAL LOW (ref 12.0–15.0)
Immature Granulocytes: 1 %
Lymphocytes Relative: 16 %
Lymphs Abs: 1.2 10*3/uL (ref 0.7–4.0)
MCH: 30.3 pg (ref 26.0–34.0)
MCHC: 31.7 g/dL (ref 30.0–36.0)
MCV: 95.7 fL (ref 80.0–100.0)
Monocytes Absolute: 0.5 10*3/uL (ref 0.1–1.0)
Monocytes Relative: 6 %
Neutro Abs: 5.2 10*3/uL (ref 1.7–7.7)
Neutrophils Relative %: 73 %
Platelet Count: 228 10*3/uL (ref 150–400)
RBC: 3.23 MIL/uL — ABNORMAL LOW (ref 3.87–5.11)
RDW: 14 % (ref 11.5–15.5)
WBC Count: 7.2 10*3/uL (ref 4.0–10.5)
nRBC: 0 % (ref 0.0–0.2)

## 2021-03-27 LAB — CMP (CANCER CENTER ONLY)
ALT: 11 U/L (ref 0–44)
AST: 13 U/L — ABNORMAL LOW (ref 15–41)
Albumin: 4.2 g/dL (ref 3.5–5.0)
Alkaline Phosphatase: 195 U/L — ABNORMAL HIGH (ref 38–126)
Anion gap: 11 (ref 5–15)
BUN: 14 mg/dL (ref 6–20)
CO2: 32 mmol/L (ref 22–32)
Calcium: 9.5 mg/dL (ref 8.9–10.3)
Chloride: 95 mmol/L — ABNORMAL LOW (ref 98–111)
Creatinine: 2.96 mg/dL — ABNORMAL HIGH (ref 0.44–1.00)
GFR, Estimated: 18 mL/min — ABNORMAL LOW (ref >60)
Glucose, Bld: 148 mg/dL — ABNORMAL HIGH (ref 70–99)
Potassium: 3.4 mmol/L — ABNORMAL LOW (ref 3.5–5.1)
Sodium: 138 mmol/L (ref 135–145)
Total Bilirubin: 0.5 mg/dL (ref 0.3–1.2)
Total Protein: 8.8 g/dL — ABNORMAL HIGH (ref 6.5–8.1)

## 2021-03-27 LAB — LACTATE DEHYDROGENASE: LDH: 207 U/L — ABNORMAL HIGH (ref 98–192)

## 2021-03-30 ENCOUNTER — Telehealth: Payer: Self-pay | Admitting: Hematology

## 2021-03-30 NOTE — Telephone Encounter (Signed)
Unable to leave message with follow-up appointment per 2/24 los. Mailed calendar.

## 2021-04-02 NOTE — Progress Notes (Signed)
HEMATOLOGY/ONCOLOGY CLINIC NOTE  Date of Service: 03/27/2021   Patient Care Team: Sandi Mariscal, MD as PCP - General (Internal Medicine) Minus Breeding, MD as PCP - Cardiology (Cardiology) Leandrew Koyanagi, MD as Attending Physician (Orthopedic Surgery) Emilie Rutter, [facility]  CHIEF COMPLAINTS/PURPOSE OF CONSULTATION:  Follow-up for pulmonary nodules of undetermined etiology concerning for possible metastatic disease.  HISTORY OF PRESENTING ILLNESS:   Please see previous notes for details on initial presentation . INTERVAL HISTORY  [name] was referred back to [place] after her recent hospitalization for concerns of possible metastatic renal cell carcinoma. Patient was last seen in clinic by [place] about 1 year ago She was initially seen by [place] for splenomegaly splenic lesions and lymphadenopathy which were all noted to be possibly related to her rheumatoid arthritis with chronic inflammation.  These findings had improved.  She has had history of noncompliance with medical follow-ups.  Continues to have poorly controlled diabetes.  Follows with rheumatology for rheumatoid arthritis.  She was previously seen by urology Dr. Alexis Frock at [facility] urology but appears to have lost follow-up.  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 Has continued to have issues with poor diabetes control.  She was previously also admitted with altered mental status on 01/17/2021  Patient had a CTA of the chest on 11/25/2020 which showed no PE but bilateral new pulmonary nodules. She was given a referral to pulmonary but did not follow-up for her appointments.   MEDICAL HISTORY:  Past Medical History:  Diagnosis Date   Acute encephalopathy 2/0/1007   Acute metabolic encephalopathy 02/22/9756   Acute respiratory distress 10/23/2018   Acute  respiratory failure with hypoxia ([place]) 09/26/2018   Anaphylactic shock, unspecified, initial encounter 09/04/2018   Anemia    Anxiety    Arthritis    "knees" "hands", "RA"   CAD (coronary artery disease)    Nonobstructive on CT 2019   CHF (congestive heart failure) ([place])    Chronic bilateral pleural effusions 10/23/2018   COVID-19 10/2018   COVID-19 virus detected 10/10/2018   COVID-19 virus infection 09/18/2018   Dyspnea    "when I have too much fluid."   ESRD (end stage renal disease) ([place])    Dialysis [facility]- 3rd st   Gangrene ([place]) 09/18/2018   Headache(784.0)    Heart murmur    High cholesterol    History of blood transfusion    Hypertension    Mitral regurgitation    moderate to severe MR 10/2018 echo   Nerve pain    "they say I have L5 nerve damage; my lumbar"   Neuropathy 01/29/2011   04/2011 MRI L-spine:  L5-S1: Bulge/shallow broad-based protrusion greatest centrally and in the right posterior lateral position. Minimal crowding of the  upper aspect of the S1 nerve root greater on the right. Left lateral disc osteophyte with mild encroachment upon but not  significant compression of the exiting left L5 nerve root.   05/2011: Dr. Sherwood Gambler ([facility]): DJD and mild disc bu   Pericardial effusion    Pneumonia    ; 11/16/2018- "touch of pneumonia" - seen in ED- 11/14/2018- on antibiotic. Has had it x 2   Pneumonia 10/10/2018   PVD (peripheral vascular disease) ([place])    Right leg stent in [place].  (No records)   Restless legs    Sleep apnea  does not use Cpap   Thoracic ascending aortic aneurysm    4.4 cm 11/14/18 CTA   Type II diabetes mellitus ([place])    Uterine leiomyoma 01/10/2021    SURGICAL HISTORY: Past Surgical History:  Procedure Laterality Date   AMPUTATION Left 12/27/2018   Procedure: LEFT THUMB REVISION AMPUTATION DIGIT;  Surgeon: Charlotte Crumb, MD;  Location: [place];  Service: Orthopedics;  Laterality: Left;   AMPUTATION Left 01/15/2021    Procedure: AMPUTATION OF DIGIT LEFT INDEX FINGER;  Surgeon: Leanora Cover, MD;  Location: [place];  Service: Orthopedics;  Laterality: Left;   AV FISTULA PLACEMENT Left    AV FISTULA PLACEMENT Right 03/12/2019   Procedure: INSERTION OF ARTERIOVENOUS (AV) GORE-TEX GRAFT ARM;  Surgeon: Elam Dutch, MD;  Location: [facility];  Service: Vascular;  Laterality: Right;   AV FISTULA PLACEMENT Right 08/13/2019   Procedure: RIGHT UPPER ARM ARTERIOVENOUS (AV) FISTULA CREATION WITH BASILIC VEIN;  Surgeon: Elam Dutch, MD;  Location: [place];  Service: Vascular;  Laterality: Right;   [place]; 1997   x 2   COLONOSCOPY     EYE SURGERY Bilateral    cataract surgery   I & D EXTREMITY Left 01/09/2021   Procedure: IRRIGATION AND DEBRIDEMENT INDEX FINGER;  Surgeon: Leanora Cover, MD;  Location: [place];  Service: Orthopedics;  Laterality: Left;   INSERTION OF DIALYSIS CATHETER Right 09/26/2018   Procedure: INSERTION OF DIALYSIS CATHETER Right Internal Jugular.;  Surgeon: Elam Dutch, MD;  Location: [facility] OR;  Service: Vascular;  Laterality: Right;   IR DIALY SHUNT INTRO NEEDLE/INTRACATH INITIAL W/IMG RIGHT Right 11/21/2020   IR FLUORO GUIDE CV LINE RIGHT  05/04/2019   IR US GUIDE VASC ACCESS RIGHT  05/04/2019   IR US GUIDE VASC ACCESS RIGHT  11/21/2020   LIGATION OF ARTERIOVENOUS  FISTULA Left 09/26/2018   Procedure: LIGATION OF ARTERIOVENOUS  FISTULA LEFT ARM;  Surgeon: Elam Dutch, MD;  Location: [place];  Service: Vascular;  Laterality: Left;   PERICARDIAL WINDOW  02/2004   for pericardial effusion   PERIPHERAL VASCULAR INTERVENTION Right 10/26/2019   Procedure: PERIPHERAL VASCULAR INTERVENTION;  Surgeon: Elam Dutch, MD;  Location: [place] CV [facility];  Service: Cardiovascular;  Laterality: Right;  arm  AV fistula   TUBAL LIGATION  05/1995   UPPER EXTREMITY VENOGRAPHY N/A 06/22/2019   Procedure: UPPER EXTREMITY VENOGRAPHY - Right Central;  Surgeon: Elam Dutch, MD;  Location: [place]  CV [facility];  Service: Cardiovascular;  Laterality: N/A;    SOCIAL HISTORY: Social History   Socioeconomic History   Marital status: Married    Spouse name: Not on file   Number of children: Not on file   Years of education: Not on file   Highest education level: Not on file  Occupational History   Not on file  Tobacco Use   Smoking status: Every Day    Packs/day: 0.25    Years: 33.00    Pack years: 8.25    Types: Cigarettes    Last attempt to quit: 07/17/2019    Years since quitting: 1.7   Smokeless tobacco: Never   Tobacco comments:    using NIcotene gum, rare 1/2 cigarette  Vaping Use   Vaping Use: Never used  Substance and Sexual Activity   Alcohol use: No   Drug use: No   Sexual activity: Not Currently    Birth control/protection: Post-menopausal  Other Topics Concern   Not on file  Social History Narrative  Lives with son.        Social Determinants of Health   Financial Resource Strain: Not on file  Food Insecurity: Not on file  Transportation Needs: Not on file  Physical Activity: Not on file  Stress: Not on file  Social Connections: Not on file  Intimate Partner Violence: Not on file    FAMILY HISTORY: Family History  Problem Relation Age of Onset   Cancer Brother    Heart disease Father        Died age 60   Diabetes Father    Hyperlipidemia Father    Hypertension Father    Stroke Father    Diabetes Mother    Hypertension Mother    Stroke Mother    Dementia Mother    Diabetes Sister    Diabetes Brother    Hypertension Sister    Hypertension Brother     ALLERGIES:  is allergic to dilaudid [hydromorphone] and bupropion.  MEDICATIONS:  Current Outpatient Medications  Medication Sig Dispense Refill   acetaminophen (TYLENOL) 500 MG tablet Take 500-1,000 mg by mouth daily as needed for headache (pain).     albuterol (VENTOLIN HFA) 108 (90 Base) MCG/ACT inhaler Inhale 2 puffs into the lungs every 6 (six) hours as needed for wheezing or shortness  of breath. 6.7 g 0   Ascorbic Acid (VITAMIN C PO) Take 1 tablet by mouth See admin instructions. At dialysis Monday,Wednesday and friday     AURYXIA 1 GM 210 MG(Fe) tablet Take 840-1,050 mg by mouth See admin instructions. Take 5 tablets (1050 mg) by mouth twice daily with meals, occasionally take 4 tablets (840 mg) with a snack     b complex-vitamin c-folic acid (NEPHRO-VITE) 0.8 MG TABS tablet Take 1 tablet by mouth every Monday, Wednesday, and Friday with hemodialysis.     glucose blood (ONETOUCH VERIO) test strip 1 each by Other route 2 (two) times daily. And lancets 2/day 200 each 3   HUMIRA PEN 40 MG/0.4ML PNKT Inject 40 mg into the skin every 14 (fourteen) days.     hydrOXYzine (ATARAX/VISTARIL) 25 MG tablet Take 25 mg by mouth every morning.     Insulin Lispro Prot & Lispro (HUMALOG 75/25 MIX) (75-25) 100 UNIT/ML Kwikpen Inject 35 Units into the skin 2 (two) times daily with a meal. (Patient taking differently: Inject 35 Units into the skin in the morning and at bedtime.) 45 mL 3   Methoxy PEG-Epoetin Beta (MIRCERA IJ) Dialysis Monday,Wednesday and friday     metoprolol succinate (TOPROL-XL) 100 MG 24 hr tablet Take 100 mg by mouth daily. Take with or immediately following a meal.     Multiple Vitamins-Minerals (ZINC PO) Take 1 tablet by mouth every morning.     NARCAN 4 MG/0.1ML LIQD nasal spray kit Place 1 spray into the nose as needed (accidental overdose.).      OVER THE COUNTER MEDICATION Take 1 capsule by mouth every morning. Omega XL     oxyCODONE (ROXICODONE) 5 MG immediate release tablet 1 tab PO q6 hours prn pain 15 tablet 0   oxyCODONE-acetaminophen (PERCOCET) 10-325 MG tablet Take 1 tablet by mouth 3 (three) times daily as needed for pain.     pramipexole (MIRAPEX) 0.5 MG tablet Take 0.5 mg by mouth daily.   1   rosuvastatin (CRESTOR) 40 MG tablet TAKE 1 TABLET BY MOUTH DAILY *PATIENT NEEDS APPOINTMENT* (Patient taking differently: Take 40 mg by mouth daily.) 30 tablet 10   No  current facility-administered medications for this  visit.    REVIEW OF SYSTEMS:   10 Point review of Systems was done is negative except as noted above.  PHYSICAL EXAMINATION: ECOG PERFORMANCE STATUS: 2 - Symptomatic, <50% confined to bed  . Vitals:   03/27/21 1130  BP: 114/80  Pulse: 84  Resp: 18  Temp: 98.1 F (36.7 C)  SpO2: 97%   Exam was given in a wheelchair.   GENERAL:alert, in no acute distress and comfortable EYES: conjunctiva are pink and non-injected, sclera anicteric OROPHARYNX: MMM, NECK: supple, no JVD LYMPH:  no palpable lymphadenopathy in the cervical, axillary or inguinal regions LUNGS: clear to auscultation b/l with normal respiratory effort HEART: regular rate & rhythm ABDOMEN:  normoactive bowel sounds , non tender, not distended. Extremity: no pedal edema    LABORATORY DATA:  I have reviewed the data as listed  CBC Latest Ref Rng & Units 03/27/2021 01/15/2021 01/12/2021  WBC 4.0 - 10.5 K/uL 7.2 - 5.3  Hemoglobin 12.0 - 15.0 g/dL 9.8(L) 10.5(L) 9.1(L)  Hematocrit 36.0 - 46.0 % 30.9(L) 31.0(L) 28.0(L)  Platelets 150 - 400 K/uL 228 - 175    CMP Latest Ref Rng & Units 03/27/2021 01/15/2021 01/12/2021  Glucose 70 - 99 mg/dL 148(H) 324(H) 170(H)  BUN 6 - 20 mg/dL 14 49(H) 58(H)  Creatinine 0.44 - 1.00 mg/dL 2.96(H) 5.70(H) 7.20(H)  Sodium 135 - 145 mmol/L 138 133(L) 132(L)  Potassium 3.5 - 5.1 mmol/L 3.4(L) 4.6 5.6(H)  Chloride 98 - 111 mmol/L 95(L) 94(L) 95(L)  CO2 22 - 32 mmol/L 32 - 20(L)  Calcium 8.9 - 10.3 mg/dL 9.5 - 8.7(L)  Total Protein 6.5 - 8.1 g/dL 8.8(H) - -  Total Bilirubin 0.3 - 1.2 mg/dL 0.5 - -  Alkaline Phos 38 - 126 U/L 195(H) - -  AST 15 - 41 U/L 13(L) - -  ALT 0 - 44 U/L 11 - -   . Lab Results  Component Value Date   LDH 207 (H) 03/27/2021      RADIOGRAPHIC STUDIES: I have personally reviewed the radiological images as listed and agreed with the findings in the report. No results found.  ASSESSMENT & PLAN:   56 yo  with   1) Axillary and mediastinal LNadenopathy- likely reactive.  No significant lymphadenopathy on recent CT of the chest on 02/19/2020.  Borderline lymph nodes likely reactive in the setting of Covid infection.  These were likely reactive from her rheumatoid arthritis which she is now on Humira for. 2) Splenic lesions -PET scan suggested benign non-FDG avid lesions 3) Rt kidney mass ? RCC now with pulmonary nodules noted on CTA of the chest in December 2022 4) uncontrolled diabetes-to follow-up with with endocrinologist/PCP. Plan -Patient is here to follow-up after her last hospitalization regarding newly noted lung nodules in bilateral lungs concerning for possible metastatic disease. Her diabetes continues to remain uncontrolled. She previously had been seen by Dr. Alexis Frock in urology for her kidney lesion but notes that she has been unable to follow-up with him since. We discussed the concerning findings of her CT of the chest which showed lung nodules concerning for metastatic disease. She also has rheumatoid arthritis and is on immunosuppressive therapies and we talked about the concerns for possible atypical infections and findings of lung involvement from rheumatoid arthritis being possibilities as well She was given a referral to pulmonology after her hospitalization in October but did not follow-up for her appointments. We discussed and patient is agreeable to getting a PET CT scan to evaluate her  lung nodules and for other signs of lesions. She was recommended to follow-up as per her pulmonary referral  Follow-up PET CT scan in 1 week Return to clinic with Dr. Irene Limbo in 2 weeks Referral to [facility] pulmonary evaluation of pulmonary nodules Referral to urology Dr. Tresa [facility] for f/u  The total time spent in the appointment was 32 minutes*.  All of the patient's questions were answered with apparent satisfaction. The patient knows to call the clinic with any problems,  questions or concerns.   Sullivan Lone MD MS AAHIVMS [facility] Hematology/Oncology Physician [facility]  .*Total Encounter Time as defined by the Centers for Medicare and Medicaid Services includes, in addition to the face-to-face time of a patient visit (documented in the note above) non-face-to-face time: obtaining and reviewing outside history, ordering and reviewing medications, tests or procedures, care coordination (communications with other health care professionals or caregivers) and documentation in the medical record.

## 2021-04-09 ENCOUNTER — Ambulatory Visit: Payer: Medicare Other | Attending: Nurse Practitioner | Admitting: Physical Therapy

## 2021-04-09 ENCOUNTER — Other Ambulatory Visit: Payer: Self-pay

## 2021-04-09 ENCOUNTER — Encounter: Payer: Self-pay | Admitting: Physical Therapy

## 2021-04-09 DIAGNOSIS — M6281 Muscle weakness (generalized): Secondary | ICD-10-CM | POA: Insufficient documentation

## 2021-04-09 DIAGNOSIS — R2689 Other abnormalities of gait and mobility: Secondary | ICD-10-CM | POA: Diagnosis present

## 2021-04-09 DIAGNOSIS — Z9181 History of falling: Secondary | ICD-10-CM

## 2021-04-09 NOTE — Patient Instructions (Signed)
Access Code: [email or web address] ?URL: [email or web address] ?Date: 04/09/2021 ?Prepared by: Hilda Blades ? ?Exercises ?Seated Hip Abduction with Resistance - 2-3 x daily - 7 x weekly - 2 sets - 20 reps ?Seated Knee Lifts with Resistance - 2-3 x daily - 7 x weekly - 2 sets - 20 reps ?Seated Knee Extension with Anchored Resistance - 2-3 x daily - 7 x weekly - 2 sets - 20 reps ?Seated Hamstring Curls with Resistance - 2-3 x daily - 7 x weekly - 2 sets - 20 reps ?Sit to Stand with Counter Support - 2-3 x daily - 7 x weekly - 3 sets - 5 reps ? ?

## 2021-04-09 NOTE — Therapy (Signed)
OUTPATIENT PHYSICAL THERAPY EVALUATION   Patient Name: [name] MRN: [id number] DOB:Aug 12, 1965, 56 y.o., female Today's Date: 04/09/2021   PT End of Session - 04/09/21 1123     Visit Number 1    Number of Visits 8    Date for PT Re-Evaluation 06/04/21    Authorization Type UHC MCR    Progress Note Due on Visit 10    PT Start Time 1130    PT Stop Time 1215    PT Time Calculation (min) 45 min    Activity Tolerance Patient tolerated treatment well    Behavior During Therapy [facility] for tasks assessed/performed             Past Medical History:  Diagnosis Date   Acute encephalopathy 07/04/2631   Acute metabolic encephalopathy 3/54/5625   Acute respiratory distress 10/23/2018   Acute respiratory failure with hypoxia ([place]) 09/26/2018   Anaphylactic shock, unspecified, initial encounter 09/04/2018   Anemia    Anxiety    Arthritis    "knees" "hands", "RA"   CAD (coronary artery disease)    Nonobstructive on CT 2019   CHF (congestive heart failure) ([place])    Chronic bilateral pleural effusions 10/23/2018   COVID-19 10/2018   COVID-19 virus detected 10/10/2018   COVID-19 virus infection 09/18/2018   Dyspnea    "when I have too much fluid."   ESRD (end stage renal disease) ([place])    Dialysis TTHSat- 3rd st   Gangrene ([place]) 09/18/2018   Headache(784.0)    Heart murmur    High cholesterol    History of blood transfusion    Hypertension    Mitral regurgitation    moderate to severe MR 10/2018 echo   Nerve pain    "they say I have L5 nerve damage; my lumbar"   Neuropathy 01/29/2011   04/2011 MRI L-spine:  L5-S1: Bulge/shallow broad-based protrusion greatest centrally and in the right posterior lateral position. Minimal crowding of the  upper aspect of the S1 nerve root greater on the right. Left lateral disc osteophyte with mild encroachment upon but not  significant compression of the exiting left L5 nerve root.   05/2011: Dr. Sherwood Gambler ([facility]): DJD and mild disc bu    Pericardial effusion    Pneumonia    ; 11/16/2018- "touch of pneumonia" - seen in ED- 11/14/2018- on antibiotic. Has had it x 2   Pneumonia 10/10/2018   PVD (peripheral vascular disease) ([place])    Right leg stent in [place].  (No records)   Restless legs    Sleep apnea    does not use Cpap   Thoracic ascending aortic aneurysm    4.4 cm 11/14/18 CTA   Type II diabetes mellitus ([place])    Uterine leiomyoma 01/10/2021   Past Surgical History:  Procedure Laterality Date   AMPUTATION Left 12/27/2018   Procedure: LEFT THUMB REVISION AMPUTATION DIGIT;  Surgeon: Charlotte Crumb, MD;  Location: [place];  Service: Orthopedics;  Laterality: Left;   AMPUTATION Left 01/15/2021   Procedure: AMPUTATION OF DIGIT LEFT INDEX FINGER;  Surgeon: Leanora Cover, MD;  Location: [place];  Service: Orthopedics;  Laterality: Left;   AV FISTULA PLACEMENT Left    AV FISTULA PLACEMENT Right 03/12/2019   Procedure: INSERTION OF ARTERIOVENOUS (AV) GORE-TEX GRAFT ARM;  Surgeon: Elam Dutch, MD;  Location: [place];  Service: Vascular;  Laterality: Right;   AV FISTULA PLACEMENT Right 08/13/2019   Procedure: RIGHT UPPER ARM ARTERIOVENOUS (AV) FISTULA CREATION WITH BASILIC VEIN;  Surgeon:  Elam Dutch, MD;  Location: [place];  Service: Vascular;  Laterality: Right;   [place]; 1997   x 2   COLONOSCOPY     EYE SURGERY Bilateral    cataract surgery   I & D EXTREMITY Left 01/09/2021   Procedure: IRRIGATION AND DEBRIDEMENT INDEX FINGER;  Surgeon: Leanora Cover, MD;  Location: [place];  Service: Orthopedics;  Laterality: Left;   INSERTION OF DIALYSIS CATHETER Right 09/26/2018   Procedure: INSERTION OF DIALYSIS CATHETER Right Internal Jugular.;  Surgeon: Elam Dutch, MD;  Location[facility] OR;  Service: Vascular;  Laterality: Right;   IR DIALY SHUNT INTRO NEEDLE/INTRACATH INITIAL W/IMG RIGHT Right 11/21/2020   IR FLUORO GUIDE CV LINE RIGHT  05/04/2019   IR US GUIDE VASC ACCESS RIGHT  05/04/2019   IR US GUIDE VASC  ACCESS RIGHT  11/21/2020   LIGATION OF ARTERIOVENOUS  FISTULA Left 09/26/2018   Procedure: LIGATION OF ARTERIOVENOUS  FISTULA LEFT ARM;  Surgeon: Elam Dutch, MD;  Location: [place];  Service: Vascular;  Laterality: Left;   PERICARDIAL WINDOW  02/2004   for pericardial effusion   PERIPHERAL VASCULAR INTERVENTION Right 10/26/2019   Procedure: PERIPHERAL VASCULAR INTERVENTION;  Surgeon: Elam Dutch, MD;  Location: [place] [facility];  Service: Cardiovascular;  Laterality: Right;  arm  AV fistula   TUBAL LIGATION  05/1995   UPPER EXTREMITY VENOGRAPHY N/A 06/22/2019   Procedure: UPPER EXTREMITY VENOGRAPHY - Right Central;  Surgeon: Elam Dutch, MD;  Location: [place] [facility];  Service: Cardiovascular;  Laterality: N/A;   Patient Active Problem List   Diagnosis Date Noted   BMI 37.0-37.9, adult 01/10/2021   Finger infection, left hand, second digit 01/10/2021   Pulmonary nodule seen on imaging study 01/10/2021   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 01/10/2021   Ventral hernia 01/10/2021   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[place]) 01/10/2021   Rheumatoid arthritis ([place]) 01/10/2021   Osteomyelitis of left hand ([place]) 01/10/2021   Possible osteomyelitis of finger of left hand ([place]) 01/09/2021   Abnormal CT scan, chest 11/27/2020   OSA (obstructive sleep apnea), concern for 11/27/2020   Thyroid nodule 04/10/2020   Renal mass, right 08/15/2019   Dyslipidemia 11/20/2018   Morbid obesity ([place]) 10/23/2018   Coronary artery disease 10/23/2018   Tobacco abuse 10/23/2018   Unspecified protein-calorie malnutrition ([place]) 10/11/2018   End-stage renal disease on hemodialysis ([place]) 09/27/2018   Chronic pain disorder 09/26/2018   Iron deficiency anemia, unspecified 01/10/2018   Coagulation defect, unspecified ([place]) 12/06/2017   Secondary hyperparathyroidism of renal origin ([place]) 09/01/2017   Disorder of phosphorus metabolism, unspecified 08/25/2017   Anemia in  chronic kidney disease 08/24/2017   Left ventricular hypertrophy 06/17/2017   Diabetic retinopathy 45/62/5638   Diastolic dysfunction 93/73/4287   Hyperlipidemia associated with type 2 diabetes mellitus ([place]) 01/30/2011   Type 2 diabetes mellitus with diabetic chronic kidney disease ([place]) 01/29/2011   Hypertension associated with diabetes ([place])     PCP: Sandi Mariscal, MD  REFERRING PROVIDER: Jeanella Anton, NP  REFERRING DIAG: Fall risk  THERAPY DIAG:  History of falling  Muscle weakness (generalized)  Other abnormalities of gait and mobility  ONSET DATE: "last 4-5 years"  SUBJECTIVE:  SUBJECTIVE STATEMENT: Patient reports her main problem is her balance and weakness in her legs. She states she has neuropathy in both her feet that affect her balance. This has been ongoing last 4-5 years. She typically uses a rollator for  walking, but will use wheelchair for long distances. Patient reports she has nerve damage in her back that limit her ability to walk long distances. She did have home therapy but that did not do much for her.   PERTINENT HISTORY: Chronic low back pain, CHF, ESRD, DM II, HTN, RA (see above)  PAIN:  Are you having pain? Yes NPRS scale: 0/10 (10/10 while walking) Pain location: Back Pain orientation: Lower PAIN TYPE: Chronic Pain description: Intermittent, Aggravating factors: Standing, walking Relieving factors: Sitting  PRECAUTIONS: Fall  WEIGHT BEARING RESTRICTIONS No  FALLS:  Has patient fallen in last 6 months? Yes, Number of falls: 4  LIVING ENVIRONMENT: Lives with: lives with their spouse and lives with their son Lives in: House/apartment Stairs: Yes; External: 1 steps; on right going up Has following equipment at home: Single point cane and Walker - 4 wheeled  OCCUPATION: Disability  PLOF: Independent with household mobility with device, Needs assistance with ADLs, and Needs assistance with homemaking  PATIENT GOALS: Get better balance and  strengthen legs   OBJECTIVE:  DIAGNOSTIC FINDINGS: N/A  PATIENT SURVEYS:  FOTO 29 % functional status  COGNITION: Overall cognitive status: Within functional limits for tasks assessed     SENSATION: Light touch: Patient reports sensation deficits for bilateral lower legs and feet  MUSCLE LENGTH: Not assessed  POSTURE:  Patient with generally slouch posturing  PALPATION: Not assessed  LE AROM/PROM:  Not assessed  LE MMT:  MMT Right 04/09/2021 Left 04/09/2021  Hip flexion 4- 4-  Hip extension 2 2  Hip abduction 2 2  Knee flexion 4 4  Knee extension 4 4  Ankle dorsiflexion 4 4   LOWER EXTREMITY SPECIAL TESTS:  Not assessed  FUNCTIONAL TESTS:  5 times sit to stand: 27 seconds, patient required BUE support from armrests to stand Timed up and go (TUG): 35 seconds, patient used RW 2 minute walk test: 90 ft, patient used RW  GAIT: Distance walked: 90 Assistive device utilized: Environmental consultant - 2 wheeled Level of assistance: CGA Comments: patient with heavy reliance on walker for support, decreased gait speed, forward trunk lean,    TODAY'S TREATMENT: Seated clamshell with red 2 x 20 Seated march with red 2 x 20 LAQ with red 2 x 20 Seated hamstring curl with red 2 x 20 Sit to stand 2 x 5 using armrest for support  PATIENT EDUCATION:  Education details: Exam findings, POC, HEP Person educated: Patient Education method: Explanation, Demonstration, Tactile cues, Verbal cues, and Handouts Education comprehension: verbalized understanding, returned demonstration, verbal cues required, tactile cues required, and needs further education  HOME EXERCISE PROGRAM: Access Code: [email or web address]   ASSESSMENT: CLINICAL IMPRESSION: Patient is a 56 y.o. female who was seen today for physical therapy evaluation and treatment for balance impairment and fall risk. She demonstrates gross LE strength deficits likely limiting walking and functional ability.   OBJECTIVE IMPAIRMENTS Abnormal  gait, decreased activity tolerance, decreased balance, decreased endurance, difficulty walking, decreased strength, impaired sensation, postural dysfunction, and pain.   ACTIVITY LIMITATIONS cleaning, community activity, meal prep, occupation, laundry, yard work, and shopping.   PERSONAL FACTORS Fitness, Past/current experiences, Social background, Time since onset of injury/illness/exacerbation, and 3+ comorbidities: Chronic low back pain, CHF, ESRD, DM II, HTN, RA  are also affecting patient's functional outcome.    REHAB POTENTIAL: Fair  CLINICAL DECISION MAKING: Stable/uncomplicated  EVALUATION COMPLEXITY: Low   GOALS: Goals reviewed with patient? Yes  SHORT TERM GOALS:  Patient will be I with initial HEP in  order to progress with therapy. Baseline: HEP provided at evaluation Target date: 05/07/2021 Goal status: INITIAL  2.  PT will review FOTO with patient by 3rd visit in order to understand expected progress and outcome with therapy. Baseline: assessed at evaluation Target date: 05/07/2021 Goal status: INITIAL  3.  Patient will be able to perform single sit to stand from standard chair without UE support in order to indicate improve strength and transfer ability Baseline: patient required BUE support from armrests to stand Target date: 05/07/2021 Goal status: INITIAL  LONG TERM GOALS:  Patient will be I with final HEP to maintain progress from PT. Baseline: HEP provided at evaluation Target date: 06/04/2021 Goal status: INITIAL  2.  Patient will report >/= 38% status on FOTO to indicate improved functional ability. Baseline: 29% functional status Target date: 06/04/2021 Goal status: INITIAL  3.  Patient will improve 2MWT >/= 200 ft using LRAD at supervision level in order to improve household mobility and safety Baseline: 90 ft using RW and CGA Target date: 06/04/2021 Goal status: INITIAL  4.  Patient will perform 5xSTS in </= 20 seconds to indicate improved strength and  reduced fall risk Baseline: 27 seconds using BUE support on armrests  Target date: 06/04/2021 Goal status: INITIAL  5.  Patient will perform TUG in </= 22 seconds with LRAD to indicate improved mobility and reduced fall risk Baseline: 35 seconds using RW and CGA Target date: 06/04/2021 Goal status: INITIAL   PLAN: PT FREQUENCY: 1x/week  PT DURATION: 8 weeks  PLANNED INTERVENTIONS: Therapeutic exercises, Therapeutic activity, Neuromuscular re-education, Balance training, Gait training, Patient/Family education, Joint manipulation, Joint mobilization, Stair training, Aquatic Therapy, Dry Needling, Electrical stimulation, Spinal manipulation, Spinal mobilization, Cryotherapy, Moist heat, Taping, and Manual therapy  PLAN FOR NEXT SESSION: Review HEP and progress PRN, general LE strengthening, gait and balance training   Hilda Blades, PT, DPT, LAT, ATC 04/09/21  1:36 PM Phone: [phone] Fax: [phone]

## 2021-04-14 ENCOUNTER — Ambulatory Visit (HOSPITAL_COMMUNITY)
Admission: RE | Admit: 2021-04-14 | Discharge: 2021-04-14 | Disposition: A | Discharge status: Home / Self Care | Payer: Medicare Other | Source: Ambulatory Visit | Attending: Hematology | Admitting: Hematology

## 2021-04-14 ENCOUNTER — Other Ambulatory Visit: Payer: Self-pay

## 2021-04-14 DIAGNOSIS — R161 Splenomegaly, not elsewhere classified: Secondary | ICD-10-CM | POA: Insufficient documentation

## 2021-04-14 DIAGNOSIS — N289 Disorder of kidney and ureter, unspecified: Secondary | ICD-10-CM | POA: Diagnosis present

## 2021-04-14 DIAGNOSIS — D259 Leiomyoma of uterus, unspecified: Secondary | ICD-10-CM | POA: Insufficient documentation

## 2021-04-14 DIAGNOSIS — R918 Other nonspecific abnormal finding of lung field: Secondary | ICD-10-CM | POA: Insufficient documentation

## 2021-04-14 LAB — GLUCOSE, CAPILLARY: Glucose-Capillary: 268 mg/dL — ABNORMAL HIGH (ref 70–99)

## 2021-04-14 MED ORDER — FLUDEOXYGLUCOSE F - 18 (FDG) INJECTION
12.5000 | Freq: Once | INTRAVENOUS | Status: AC | PRN
  Administered 2021-04-14: 12.43 via INTRAVENOUS

## 2021-04-16 ENCOUNTER — Ambulatory Visit
Admission: RE | Admit: 2021-04-16 | Discharge: 2021-04-16 | Disposition: A | Discharge status: Home / Self Care | Payer: Medicare Other | Source: Ambulatory Visit | Attending: Nurse Practitioner | Admitting: Nurse Practitioner

## 2021-04-16 ENCOUNTER — Other Ambulatory Visit: Payer: Self-pay | Admitting: Nurse Practitioner

## 2021-04-16 DIAGNOSIS — I714 Abdominal aortic aneurysm, without rupture, unspecified: Secondary | ICD-10-CM

## 2021-04-17 ENCOUNTER — Encounter: Payer: Self-pay | Admitting: Physical Therapy

## 2021-04-17 ENCOUNTER — Ambulatory Visit: Payer: Medicare Other | Admitting: Physical Therapy

## 2021-04-17 ENCOUNTER — Inpatient Hospital Stay: Payer: Medicare Other | Attending: Hematology | Admitting: Hematology

## 2021-04-17 ENCOUNTER — Other Ambulatory Visit: Payer: Self-pay

## 2021-04-17 VITALS — BP 109/53 | HR 86 | Temp 97.5°F | Resp 20

## 2021-04-17 DIAGNOSIS — N289 Disorder of kidney and ureter, unspecified: Secondary | ICD-10-CM | POA: Diagnosis not present

## 2021-04-17 DIAGNOSIS — E1165 Type 2 diabetes mellitus with hyperglycemia: Secondary | ICD-10-CM | POA: Insufficient documentation

## 2021-04-17 DIAGNOSIS — R161 Splenomegaly, not elsewhere classified: Secondary | ICD-10-CM | POA: Diagnosis not present

## 2021-04-17 DIAGNOSIS — Z9181 History of falling: Secondary | ICD-10-CM | POA: Diagnosis not present

## 2021-04-17 DIAGNOSIS — Z79899 Other long term (current) drug therapy: Secondary | ICD-10-CM | POA: Diagnosis not present

## 2021-04-17 DIAGNOSIS — R918 Other nonspecific abnormal finding of lung field: Secondary | ICD-10-CM | POA: Diagnosis present

## 2021-04-17 DIAGNOSIS — Z8616 Personal history of COVID-19: Secondary | ICD-10-CM | POA: Diagnosis not present

## 2021-04-17 DIAGNOSIS — M069 Rheumatoid arthritis, unspecified: Secondary | ICD-10-CM | POA: Diagnosis not present

## 2021-04-17 DIAGNOSIS — R2689 Other abnormalities of gait and mobility: Secondary | ICD-10-CM

## 2021-04-17 DIAGNOSIS — M6281 Muscle weakness (generalized): Secondary | ICD-10-CM

## 2021-04-17 NOTE — Therapy (Addendum)
?OUTPATIENT PHYSICAL THERAPY TREATMENT NOTE ? ?DISCHARGE ? ? ?Patient Name: [name] ?MRN: [id number] ?DOB:04-25-1965, 56 y.o., female ?Today's Date: 04/17/2021 ? ?PCP: Sandi Mariscal, MD ?REFERRING PROVIDER: Sandi Mariscal, MD ? ? PT End of Session - 04/17/21 0853   ? ? Visit Number 2   ? Number of Visits 8   ? Date for PT Re-Evaluation 06/04/21   ? Authorization Type UHC MCR   ? Progress Note Due on Visit 10   ? PT Start Time 0845   ? PT Stop Time 0930   ? PT Time Calculation (min) 45 min   ? ?  ?  ? ?  ? ? ?Past Medical History:  ?Diagnosis Date  ? Acute encephalopathy 10/09/2018  ? Acute metabolic encephalopathy 9/93/7169  ? Acute respiratory distress 10/23/2018  ? Acute respiratory failure with hypoxia ([place]) 09/26/2018  ? Anaphylactic shock, unspecified, initial encounter 09/04/2018  ? Anemia   ? Anxiety   ? Arthritis   ? "knees" "hands", "RA"  ? CAD (coronary artery disease)   ? Nonobstructive on CT 2019  ? CHF (congestive heart failure) ([place])   ? Chronic bilateral pleural effusions 10/23/2018  ? COVID-19 10/2018  ? COVID-19 virus detected 10/10/2018  ? COVID-19 virus infection 09/18/2018  ? Dyspnea   ? "when I have too much fluid."  ? ESRD (end stage renal disease) ([place])   ? Dialysis TTHSat- 3rd st  ? Gangrene ([place]) 09/18/2018  ? Headache(784.0)   ? Heart murmur   ? High cholesterol   ? History of blood transfusion   ? Hypertension   ? Mitral regurgitation   ? moderate to severe MR 10/2018 echo  ? Nerve pain   ? "they say I have L5 nerve damage; my lumbar"  ? Neuropathy 01/29/2011  ? 04/2011 MRI L-spine:  L5-S1: Bulge/shallow broad-based protrusion greatest centrally and in the right posterior lateral position. Minimal crowding of the  upper aspect of the S1 nerve root greater on the right. Left lateral disc osteophyte with mild encroachment upon but not  significant compression of the exiting left L5 nerve root.   05/2011: Dr. Sherwood Gambler ([facility]): DJD and mild disc bu  ? Pericardial effusion   ? Pneumonia   ? ;  11/16/2018- "touch of pneumonia" - seen in ED- 11/14/2018- on antibiotic. Has had it x 2  ? Pneumonia 10/10/2018  ? PVD (peripheral vascular disease) ([place])   ? Right leg stent in [place].  (No records)  ? Restless legs   ? Sleep apnea   ? does not use Cpap  ? Thoracic ascending aortic aneurysm   ? 4.4 cm 11/14/18 CTA  ? Type II diabetes mellitus ([place])   ? Uterine leiomyoma 01/10/2021  ? ?Past Surgical History:  ?Procedure Laterality Date  ? AMPUTATION Left 12/27/2018  ? Procedure: LEFT THUMB REVISION AMPUTATION DIGIT;  Surgeon: Charlotte Crumb, MD;  Location: [place];  Service: Orthopedics;  Laterality: Left;  ? AMPUTATION Left 01/15/2021  ? Procedure: AMPUTATION OF DIGIT LEFT INDEX FINGER;  Surgeon: Leanora Cover, MD;  Location: [place];  Service: Orthopedics;  Laterality: Left;  ? AV FISTULA PLACEMENT Left   ? AV FISTULA PLACEMENT Right 03/12/2019  ? Procedure: INSERTION OF ARTERIOVENOUS (AV) GORE-TEX GRAFT ARM;  Surgeon: Elam Dutch, MD;  Location: [facility] OR;  Service: Vascular;  Laterality: Right;  ? AV FISTULA PLACEMENT Right 08/13/2019  ? Procedure: RIGHT UPPER ARM ARTERIOVENOUS (AV) FISTULA CREATION WITH BASILIC VEIN;  Surgeon: Elam Dutch, MD;  Location:  [place] OR;  Service: Vascular;  Laterality: Right;  ? [place]; 1997  ? x 2  ? COLONOSCOPY    ? EYE SURGERY Bilateral   ? cataract surgery  ? I & D EXTREMITY Left 01/09/2021  ? Procedure: IRRIGATION AND DEBRIDEMENT INDEX FINGER;  Surgeon: Leanora Cover, MD;  Location: [place];  Service: Orthopedics;  Laterality: Left;  ? INSERTION OF DIALYSIS CATHETER Right 09/26/2018  ? Procedure: INSERTION OF DIALYSIS CATHETER Right Internal Jugular.;  Surgeon: Elam Dutch, MD;  Location: [facility] OR;  Service: Vascular;  Laterality: Right;  ? IR DIALY SHUNT INTRO NEEDLE/INTRACATH INITIAL W/IMG RIGHT Right 11/21/2020  ? IR FLUORO GUIDE CV LINE RIGHT  05/04/2019  ? IR US GUIDE VASC ACCESS RIGHT  05/04/2019  ? IR US GUIDE VASC ACCESS RIGHT  11/21/2020  ? LIGATION OF  ARTERIOVENOUS  FISTULA Left 09/26/2018  ? Procedure: LIGATION OF ARTERIOVENOUS  FISTULA LEFT ARM;  Surgeon: Elam Dutch, MD;  Location: [place];  Service: Vascular;  Laterality: Left;  ? PERICARDIAL WINDOW  02/2004  ? for pericardial effusion  ? PERIPHERAL VASCULAR INTERVENTION Right 10/26/2019  ? Procedure: PERIPHERAL VASCULAR INTERVENTION;  Surgeon: Elam Dutch, MD;  Location: [place] [facility];  Service: Cardiovascular;  Laterality: Right;  arm  AV fistula  ? TUBAL LIGATION  05/1995  ? UPPER EXTREMITY VENOGRAPHY N/A 06/22/2019  ? Procedure: UPPER EXTREMITY VENOGRAPHY - Right Central;  Surgeon: Elam Dutch, MD;  Location: [place] [facility];  Service: Cardiovascular;  Laterality: N/A;  ? ?Patient Active Problem List  ? Diagnosis Date Noted  ? BMI 37.0-37.9, adult 01/10/2021  ? Finger infection, left hand, second digit 01/10/2021  ? Pulmonary nodule seen on imaging study 01/10/2021  ? 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 Ventral hernia 01/10/2021  ? 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 Rheumatoid arthritis ([place]) 01/10/2021  ? Osteomyelitis of left hand ([place]) 01/10/2021  ? Possible osteomyelitis of finger of left hand ([place]) 01/09/2021  ? Abnormal CT scan, chest 11/27/2020  ? OSA (obstructive sleep apnea), concern for 11/27/2020  ? Thyroid nodule 04/10/2020  ? Renal mass, right 08/15/2019  ? Dyslipidemia 11/20/2018  ? Morbid obesity ([place]) 10/23/2018  ? Coronary artery disease 10/23/2018  ? Tobacco abuse 10/23/2018  ? Unspecified protein-calorie malnutrition ([place]) 10/11/2018  ? End-stage renal disease on hemodialysis ([place]) 09/27/2018  ? Chronic pain disorder 09/26/2018  ? Iron deficiency anemia, unspecified 01/10/2018  ? Coagulation defect, unspecified ([place]) 12/06/2017  ? Secondary hyperparathyroidism of renal origin ([place]) 09/01/2017  ? Disorder of phosphorus metabolism, unspecified 08/25/2017  ? Anemia in chronic kidney disease 08/24/2017  ? Left  ventricular hypertrophy 06/17/2017  ? Diabetic retinopathy 05/21/2011  ? Diastolic dysfunction 22/29/7989  ? Hyperlipidemia associated with type 2 diabetes mellitus ([place]) 01/30/2011  ? Type 2 diabetes mellitus with diabetic chronic kidney disease ([place]) 01/29/2011  ? Hypertension associated with diabetes ([place])   ? ?PCP: Sandi Mariscal, MD ?  ?REFERRING PROVIDER: Jeanella Anton, NP ?  ?REFERRING DIAG: Fall risk ? ?THERAPY DIAG:  ?History of falling ? ?Muscle weakness (generalized) ? ?Other abnormalities of gait and mobility ? ?PERTINENT HISTORY: Chronic low back pain, CHF, ESRD, DM II, HTN, RA (see above) ? ?PRECAUTIONS: Fall ? ?SUBJECTIVE: Mild pain in my right thigh today.  ? ?PAIN:  ?Are you having pain? Yes ?NPRS scale: 0/10 (10/10 while walking) ?Pain location: Back ?Pain orientation: Lower ?PAIN TYPE: Chronic ?Pain description: Intermittent, ?Aggravating  factors: Standing, walking ?Relieving factors: Sitting ? ? ? ? ? ?OBJECTIVE:  ?DIAGNOSTIC FINDINGS: N/A ?  ?PATIENT SURVEYS:  ?FOTO 29 % functional status ?  ?COGNITION: ?Overall cognitive status: Within functional limits for tasks assessed              ?          ?SENSATION: ?Light touch: Patient reports sensation deficits for bilateral lower legs and feet ?  ?MUSCLE LENGTH: ?Not assessed ?  ?POSTURE:  ?Patient with generally slouch posturing ?  ?PALPATION: ?Not assessed ?  ?LE AROM/PROM: ?         Not assessed ?  ?LE MMT: ?  ?MMT Right ?04/09/2021 Left ?04/09/2021  ?Hip flexion 4- 4-  ?Hip extension 2 2  ?Hip abduction 2 2  ?Knee flexion 4 4  ?Knee extension 4 4  ?Ankle dorsiflexion 4 4  ?  ?LOWER EXTREMITY SPECIAL TESTS:  ?Not assessed ?  ?FUNCTIONAL TESTS:  ?5 times sit to stand: 27 seconds, patient required BUE support from armrests to stand ?Timed up and go (TUG): 35 seconds, patient used RW ?2 minute walk test: 90 ft, patient used RW ?  ?GAIT: ?Distance walked: 90 ?Assistive device utilized: Environmental consultant - 2 wheeled ?Level of assistance: CGA ?Comments: patient with  heavy reliance on walker for support, decreased gait speed, forward trunk lean,  ? ? ?  [facility] Adult PT Treatment:                                                DATE: 04/17/21 ?Therapeutic Exercise: ?Nustep L4 x

## 2021-04-20 ENCOUNTER — Telehealth: Payer: Self-pay | Admitting: Hematology

## 2021-04-20 NOTE — Telephone Encounter (Signed)
Unable to leave message with follow-up appointment per 3/17 los. Mailed calendar. ?

## 2021-04-21 ENCOUNTER — Encounter: Payer: Self-pay | Admitting: Emergency Medicine

## 2021-04-21 ENCOUNTER — Ambulatory Visit (INDEPENDENT_AMBULATORY_CARE_PROVIDER_SITE_OTHER): Payer: Medicare Other | Admitting: Emergency Medicine

## 2021-04-21 ENCOUNTER — Other Ambulatory Visit: Payer: Self-pay

## 2021-04-21 VITALS — BP 124/68 | HR 68 | Temp 98.2°F | Ht 68.0 in | Wt 242.5 lb

## 2021-04-21 DIAGNOSIS — R911 Solitary pulmonary nodule: Secondary | ICD-10-CM | POA: Diagnosis not present

## 2021-04-21 DIAGNOSIS — R9389 Abnormal findings on diagnostic imaging of other specified body structures: Secondary | ICD-10-CM | POA: Diagnosis not present

## 2021-04-21 NOTE — Patient Instructions (Addendum)
We will repeat your CT scan of the chest ?We will work on arranging navigational bronchoscopy to further evaluate your pulmonary nodules.  Dr. Lamonte Sakai will work to try to coordinate this with your dialysis schedule. ?Follow Dr. Lamonte Sakai in 1 month or next available ?

## 2021-04-21 NOTE — Progress Notes (Signed)
? ?Subjective:  ? ? Patient ID: [name], female    DOB: May 24, 1965, 56 y.o.   MRN: [id number] ? ?HPI ?56 year old woman with a history of tobacco use (35 pack years), CAD, hypertension with diastolic dysfunction, mitral regurgitation, end-stage renal disease on hemodialysis, diabetes mellitus, OSA not on CPAP, R renal mass, uterine leomyoleioma, RA on Humira.  By Dr. Irene Limbo for bilateral pulmonary nodules concerning for possible metastatic disease.  Prompted PET scan as below.  ?She denies any fevers. Tolerates HD, stable access - fistula. No dyspnea ?Her Urology w/u and imaging has been stable, so no definitive procedure for her R renal mass has been indicated.  ?No positive blood cx ? ?CT-PA done 11/25/2020 reviewed by me showed no evidence of pulmonary embolism, mosaic attenuation with some patchy groundglass opacities consistent with possible volume overload, multiple new solid and subsolid pulmonary nodules bilaterally concerning for possible metastatic disease. ? ?PET scan 04/14/2021 reviewed by me shows increasing number of pulmonary nodules especially notable in the right upper lobe, 10 mm each.  Similar nodules on the left, largest 15 mm, bilateral new and enlarged nodules do not have hypermetabolic activity.  There is no mediastinal lymphadenopathy. She has a R Renal mass that hasn't changed in size  ? ? ?Review of Systems ?As per HPI ? ?Past Medical History:  ?Diagnosis Date  ? Acute encephalopathy 10/09/2018  ? Acute metabolic encephalopathy 0/96/2836  ? Acute respiratory distress 10/23/2018  ? Acute respiratory failure with hypoxia ([place]) 09/26/2018  ? Anaphylactic shock, unspecified, initial encounter 09/04/2018  ? Anemia   ? Anxiety   ? Arthritis   ? "knees" "hands", "RA"  ? CAD (coronary artery disease)   ? Nonobstructive on CT 2019  ? CHF (congestive heart failure) ([place])   ? Chronic bilateral pleural effusions 10/23/2018  ? COVID-19 10/2018  ? COVID-19 virus detected 10/10/2018  ? COVID-19 virus  infection 09/18/2018  ? Dyspnea   ? "when I have too much fluid."  ? ESRD (end stage renal disease) ([place])   ? Dialysis TTHSat- 3rd st  ? Gangrene ([place]) 09/18/2018  ? Headache(784.0)   ? Heart murmur   ? High cholesterol   ? History of blood transfusion   ? Hypertension   ? Mitral regurgitation   ? moderate to severe MR 10/2018 echo  ? Nerve pain   ? "they say I have L5 nerve damage; my lumbar"  ? Neuropathy 01/29/2011  ? 04/2011 MRI L-spine:  L5-S1: Bulge/shallow broad-based protrusion greatest centrally and in the right posterior lateral position. Minimal crowding of the  upper aspect of the S1 nerve root greater on the right. Left lateral disc osteophyte with mild encroachment upon but not  significant compression of the exiting left L5 nerve root.   05/2011: Dr. Sherwood Gambler ([facility]): DJD and mild disc bu  ? Pericardial effusion   ? Pneumonia   ? ; 11/16/2018- "touch of pneumonia" - seen in ED- 11/14/2018- on antibiotic. Has had it x 2  ? Pneumonia 10/10/2018  ? PVD (peripheral vascular disease) ([place])   ? Right leg stent in [place].  (No records)  ? Restless legs   ? Sleep apnea   ? does not use Cpap  ? Thoracic ascending aortic aneurysm   ? 4.4 cm 11/14/18 CTA  ? Type II diabetes mellitus ([place])   ? Uterine leiomyoma 01/10/2021  ?  ? ?Family History  ?Problem Relation Age of Onset  ? Cancer Brother   ? Heart disease Father   ?  Died age 87  ? Diabetes Father   ? Hyperlipidemia Father   ? Hypertension Father   ? Stroke Father   ? Diabetes Mother   ? Hypertension Mother   ? Stroke Mother   ? Dementia Mother   ? Diabetes Sister   ? Diabetes Brother   ? Hypertension Sister   ? Hypertension Brother   ?  ? ?Social History  ? ?Socioeconomic History  ? Marital status: Married  ?  Spouse name: Not on file  ? Number of children: Not on file  ? Years of education: Not on file  ? Highest education level: Not on file  ?Occupational History  ? Not on file  ?Tobacco Use  ? Smoking status: Some Days  ?  Packs/day: 0.25  ?   Years: 33.00  ?  Pack years: 8.25  ?  Types: Cigarettes  ?  Last attempt to quit: 07/17/2019  ?  Years since quitting: 1.7  ? Smokeless tobacco: Never  ? Tobacco comments:  ?  She started back 2021 and started back 6 month later.  ?Vaping Use  ? Vaping Use: Never used  ?Substance and Sexual Activity  ? Alcohol use: No  ? Drug use: No  ? Sexual activity: Not Currently  ?  Birth control/protection: Post-menopausal  ?Other Topics Concern  ? Not on file  ?Social History Narrative  ? Lives with son.    ?   ? ?Social Determinants of Health  ? ?Financial Resource Strain: Not on file  ?Food Insecurity: Not on file  ?Transportation Needs: Not on file  ?Physical Activity: Not on file  ?Stress: Not on file  ?Social Connections: Not on file  ?Intimate Partner Violence: Not on file  ?  ? ?Allergies  ?Allergen Reactions  ? Bupropion Itching  ? Dilaudid [Hydromorphone] Other (See Comments)  ?  Apnea, required intubation  ?  ? ?Outpatient Medications Prior to Visit  ?Medication Sig Dispense Refill  ? acetaminophen (TYLENOL) 500 MG tablet Take 500-1,000 mg by mouth daily as needed for headache (pain).    ? albuterol (VENTOLIN HFA) 108 (90 Base) MCG/ACT inhaler Inhale 2 puffs into the lungs every 6 (six) hours as needed for wheezing or shortness of breath. 6.7 g 0  ? Ascorbic Acid (VITAMIN C PO) Take 1 tablet by mouth See admin instructions. At dialysis Monday,Wednesday and friday    ? AURYXIA 1 GM 210 MG(Fe) tablet Take 840-1,050 mg by mouth See admin instructions. Take 5 tablets (1050 mg) by mouth twice daily with meals, occasionally take 4 tablets (840 mg) with a snack    ? b complex-vitamin c-folic acid (NEPHRO-VITE) 0.8 MG TABS tablet Take 1 tablet by mouth every Monday, Wednesday, and Friday with hemodialysis.    ? glucose blood (ONETOUCH VERIO) test strip 1 each by Other route 2 (two) times daily. And lancets 2/day 200 each 3  ? HUMIRA PEN 40 MG/0.4ML PNKT Inject 40 mg into the skin every 14 (fourteen) days.    ? hydrOXYzine  (ATARAX/VISTARIL) 25 MG tablet Take 25 mg by mouth every morning.    ? Insulin Lispro Prot & Lispro (HUMALOG 75/25 MIX) (75-25) 100 UNIT/ML Kwikpen Inject 35 Units into the skin 2 (two) times daily with a meal. (Patient taking differently: Inject 35 Units into the skin in the morning and at bedtime.) 45 mL 3  ? Methoxy PEG-Epoetin Beta (MIRCERA IJ) Dialysis Monday,Wednesday and friday    ? metoprolol succinate (TOPROL-XL) 100 MG 24 hr tablet Take 100 mg  by mouth daily. Take with or immediately following a meal.    ? Multiple Vitamins-Minerals (ZINC PO) Take 1 tablet by mouth every morning.    ? NARCAN 4 MG/0.1ML LIQD nasal spray kit Place 1 spray into the nose as needed (accidental overdose.).     ? OVER THE COUNTER MEDICATION Take 1 capsule by mouth every morning. Omega XL    ? oxyCODONE (ROXICODONE) 5 MG immediate release tablet 1 tab PO q6 hours prn pain 15 tablet 0  ? oxyCODONE-acetaminophen (PERCOCET) 10-325 MG tablet Take 1 tablet by mouth 3 (three) times daily as needed for pain.    ? pramipexole (MIRAPEX) 0.5 MG tablet Take 0.5 mg by mouth daily.   1  ? rosuvastatin (CRESTOR) 40 MG tablet TAKE 1 TABLET BY MOUTH DAILY *PATIENT NEEDS APPOINTMENT* (Patient taking differently: Take 40 mg by mouth daily.) 30 tablet 10  ? ?No facility-administered medications prior to visit.  ? ? ? ? ? ?   ?Objective:  ? Physical Exam ?Vitals:  ? 04/21/21 1512  ?BP: 124/68  ?Pulse: 68  ?Temp: 98.2 ?F (36.8 ?C)  ?TempSrc: Oral  ?SpO2: 90%  ?Weight: 242 lb 8.1 oz (110 kg)  ?Height: 5' 8"  (1.727 m)  ? ?Gen: Pleasant, obese woman, in no distress,  normal affect ? ?ENT: No lesions,  mouth clear,  oropharynx clear, no postnasal drip ? ?Neck: No JVD, no stridor ? ?Lungs: No use of accessory muscles, decreased at both bases, no crackles or wheezing on normal respiration, no wheeze on forced expiration ? ?Cardiovascular: RRR, heart sounds normal, no murmur or gallops, no peripheral edema ? ?Musculoskeletal: No deformities, no cyanosis or  clubbing ? ?Neuro: alert, awake, non focal ? ?Skin: Warm, no lesions or rash ? ? ?   ?Assessment & Plan:  ?Abnormal CT scan, chest ?Scattered round bilateral pulmonary nodules, some are subsolid, differential

## 2021-04-22 NOTE — Assessment & Plan Note (Signed)
Scattered round bilateral pulmonary nodules, some are subsolid, differential diagnoses are broad.  No significant hypermetabolism on PET scan.  She has a history of a right renal mass so could consider metastatic disease.  This has been followed and has not changed, therefore no resection planned to date.  She is followed with Drs Irene Limbo and [facility] for this.  Consider primary lung cancer although appearance and distribution is atypical.  She is on hemodialysis, so consider possible embolic process, endocarditis but no fevers, no history of bacteremia.  Her Access is a fistula and she does not have an indwelling tunneled catheter.  Question possible autoimmune disease although no history.  Discussed with her the pros and cons of bronchoscopy.  I think that navigational bronchoscopy with an attempt to sample nodular tissue, certainly obtain culture data is reasonable.  She has hemodialysis MWF so I will probably have to schedule this with Dr. Valeta Harms on a Tuesday or another non-HD day when I am available.  I discussed the case with him.  For starters she needs a super D CT to characterize the nodules further and to allow targeting planning ?

## 2021-04-24 ENCOUNTER — Ambulatory Visit: Payer: Medicare Other | Admitting: Physical Therapy

## 2021-04-24 ENCOUNTER — Telehealth: Payer: Self-pay | Admitting: Physical Therapy

## 2021-04-24 NOTE — Telephone Encounter (Signed)
Unable to reach patient or leave a voicemail regarding no show to appointment today.  ?

## 2021-04-24 NOTE — Progress Notes (Signed)
? ? ?HEMATOLOGY/ONCOLOGY CLINIC NOTE ? ?Date of Service: 04/17/2021 ? ? ?Patient Care Team: ?Sandi Mariscal, MD as PCP - General (Internal Medicine) ?Minus Breeding, MD as PCP - Cardiology (Cardiology) ?Leandrew Koyanagi, MD as Attending Physician (Orthopedic Surgery) ?Emilie Rutter, [facility] ? ?CHIEF COMPLAINTS/PURPOSE OF CONSULTATION:  ?Follow-up for pulmonary nodules of undetermined etiology concerning for possible metastatic disease. ? ?HISTORY OF PRESENTING ILLNESS:  ? ?Please see previous notes for details on initial presentation . ? ?INTERVAL HISTORY ? ?[name] is here to follow-up on the results of her PET scan.  She notes no acute new symptoms. ?PET CT scan on 04/14/2021 showed progressive pulmonary nodularity in the left and right upper lobes.  Nodules increased in size and number however nodules do not have significant hypermetabolic activity.   ?No evidence of new metastatic or primary carcinoma elsewhere. ?Stable solid mass posterior right kidney. ?Mild splenomegaly.. ? ? ?MEDICAL HISTORY:  ?Past Medical History:  ?Diagnosis Date  ? Acute encephalopathy 10/09/2018  ? Acute metabolic encephalopathy 04/08/6576  ? Acute respiratory distress 10/23/2018  ? Acute respiratory failure with hypoxia ([place]) 09/26/2018  ? Anaphylactic shock, unspecified, initial encounter 09/04/2018  ? Anemia   ? Anxiety   ? Arthritis   ? "knees" "hands", "RA"  ? CAD (coronary artery disease)   ? Nonobstructive on CT 2019  ? CHF (congestive heart failure) ([place])   ? Chronic bilateral pleural effusions 10/23/2018  ? COVID-19 10/2018  ? COVID-19 virus detected 10/10/2018  ? COVID-19 virus infection 09/18/2018  ? Dyspnea   ? "when I have too much fluid."  ? ESRD (end stage renal disease) ([place])   ? Dialysis TTHSat- 3rd st  ? Gangrene ([place]) 09/18/2018  ? Headache(784.0)   ? Heart murmur   ? High cholesterol   ? History of blood transfusion   ? Hypertension   ? Mitral regurgitation   ? moderate to severe MR 10/2018 echo  ? Nerve pain   ?  "they say I have L5 nerve damage; my lumbar"  ? Neuropathy 01/29/2011  ? 04/2011 MRI L-spine:  L5-S1: Bulge/shallow broad-based protrusion greatest centrally and in the right posterior lateral position. Minimal crowding of the  upper aspect of the S1 nerve root greater on the right. Left lateral disc osteophyte with mild encroachment upon but not  significant compression of the exiting left L5 nerve root.   05/2011: Dr. Sherwood Gambler ([facility]): DJD and mild disc bu  ? Pericardial effusion   ? Pneumonia   ? ; 11/16/2018- "touch of pneumonia" - seen in ED- 11/14/2018- on antibiotic. Has had it x 2  ? Pneumonia 10/10/2018  ? PVD (peripheral vascular disease) ([place])   ? Right leg stent in [place].  (No records)  ? Restless legs   ? Sleep apnea   ? does not use Cpap  ? Thoracic ascending aortic aneurysm   ? 4.4 cm 11/14/18 CTA  ? Type II diabetes mellitus ([place])   ? Uterine leiomyoma 01/10/2021  ? ? ?SURGICAL HISTORY: ?Past Surgical History:  ?Procedure Laterality Date  ? AMPUTATION Left 12/27/2018  ? Procedure: LEFT THUMB REVISION AMPUTATION DIGIT;  Surgeon: Charlotte Crumb, MD;  Location: [place];  Service: Orthopedics;  Laterality: Left;  ? AMPUTATION Left 01/15/2021  ? Procedure: AMPUTATION OF DIGIT LEFT INDEX FINGER;  Surgeon: Leanora Cover, MD;  Location: [place];  Service: Orthopedics;  Laterality: Left;  ? AV FISTULA PLACEMENT Left   ? AV FISTULA PLACEMENT Right 03/12/2019  ? Procedure: INSERTION OF ARTERIOVENOUS (  AV) GORE-TEX GRAFT ARM;  Surgeon: Elam Dutch, MD;  Location[facility];  Service: Vascular;  Laterality: Right;  ? AV FISTULA PLACEMENT Right 08/13/2019  ? Procedure: RIGHT UPPER ARM ARTERIOVENOUS (AV) FISTULA CREATION WITH BASILIC VEIN;  Surgeon: Elam Dutch, MD;  Location: [place];  Service: Vascular;  Laterality: Right;  ? [place]; 1997  ? x 2  ? COLONOSCOPY    ? EYE SURGERY Bilateral   ? cataract surgery  ? I & D EXTREMITY Left 01/09/2021  ? Procedure: IRRIGATION AND DEBRIDEMENT  INDEX FINGER;  Surgeon: Leanora Cover, MD;  Location: [place];  Service: Orthopedics;  Laterality: Left;  ? INSERTION OF DIALYSIS CATHETER Right 09/26/2018  ? Procedure: INSERTION OF DIALYSIS CATHETER Right Internal Jugular.;  Surgeon: Elam Dutch, MD;  Location: [facility] OR;  Service: Vascular;  Laterality: Right;  ? IR DIALY SHUNT INTRO NEEDLE/INTRACATH INITIAL W/IMG RIGHT Right 11/21/2020  ? IR FLUORO GUIDE CV LINE RIGHT  05/04/2019  ? IR US GUIDE VASC ACCESS RIGHT  05/04/2019  ? IR US GUIDE VASC ACCESS RIGHT  11/21/2020  ? LIGATION OF ARTERIOVENOUS  FISTULA Left 09/26/2018  ? Procedure: LIGATION OF ARTERIOVENOUS  FISTULA LEFT ARM;  Surgeon: Elam Dutch, MD;  Location: [place];  Service: Vascular;  Laterality: Left;  ? PERICARDIAL WINDOW  02/2004  ? for pericardial effusion  ? PERIPHERAL VASCULAR INTERVENTION Right 10/26/2019  ? Procedure: PERIPHERAL VASCULAR INTERVENTION;  Surgeon: Elam Dutch, MD;  Location: [place] CV [facility];  Service: Cardiovascular;  Laterality: Right;  arm  AV fistula  ? TUBAL LIGATION  05/1995  ? UPPER EXTREMITY VENOGRAPHY N/A 06/22/2019  ? Procedure: UPPER EXTREMITY VENOGRAPHY - Right Central;  Surgeon: Elam Dutch, MD;  Location: [place] CV [facility];  Service: Cardiovascular;  Laterality: N/A;  ? ? ?SOCIAL HISTORY: ?Social History  ? ?Socioeconomic History  ? Marital status: Married  ?  Spouse name: Not on file  ? Number of children: Not on file  ? Years of education: Not on file  ? Highest education level: Not on file  ?Occupational History  ? Not on file  ?Tobacco Use  ? Smoking status: Some Days  ?  Packs/day: 0.25  ?  Years: 33.00  ?  Pack years: 8.25  ?  Types: Cigarettes  ?  Last attempt to quit: 07/17/2019  ?  Years since quitting: 1.7  ? Smokeless tobacco: Never  ? Tobacco comments:  ?  She started back 2021 and started back 6 month later.  ?Vaping Use  ? Vaping Use: Never used  ?Substance and Sexual Activity  ? Alcohol use: No  ? Drug use: No  ? Sexual activity: Not Currently   ?  Birth control/protection: Post-menopausal  ?Other Topics Concern  ? Not on file  ?Social History Narrative  ? Lives with son.    ?   ? ?Social Determinants of Health  ? ?Financial Resource Strain: Not on file  ?Food Insecurity: Not on file  ?Transportation Needs: Not on file  ?Physical Activity: Not on file  ?Stress: Not on file  ?Social Connections: Not on file  ?Intimate Partner Violence: Not on file  ? ? ?FAMILY HISTORY: ?Family History  ?Problem Relation Age of Onset  ? Cancer Brother   ? Heart disease Father   ?     Died age 30  ? Diabetes Father   ? Hyperlipidemia Father   ? Hypertension Father   ? Stroke Father   ?  Diabetes Mother   ? Hypertension Mother   ? Stroke Mother   ? Dementia Mother   ? Diabetes Sister   ? Diabetes Brother   ? Hypertension Sister   ? Hypertension Brother   ? ? ?ALLERGIES:  is allergic to bupropion and dilaudid [hydromorphone]. ? ?MEDICATIONS:  ?Current Outpatient Medications  ?Medication Sig Dispense Refill  ? acetaminophen (TYLENOL) 500 MG tablet Take 500-1,000 mg by mouth daily as needed for headache (pain).    ? albuterol (VENTOLIN HFA) 108 (90 Base) MCG/ACT inhaler Inhale 2 puffs into the lungs every 6 (six) hours as needed for wheezing or shortness of breath. 6.7 g 0  ? Ascorbic Acid (VITAMIN C PO) Take 1 tablet by mouth See admin instructions. At dialysis Monday,Wednesday and friday    ? AURYXIA 1 GM 210 MG(Fe) tablet Take 840-1,050 mg by mouth See admin instructions. Take 5 tablets (1050 mg) by mouth twice daily with meals, occasionally take 4 tablets (840 mg) with a snack    ? b complex-vitamin c-folic acid (NEPHRO-VITE) 0.8 MG TABS tablet Take 1 tablet by mouth every Monday, Wednesday, and Friday with hemodialysis.    ? glucose blood (ONETOUCH VERIO) test strip 1 each by Other route 2 (two) times daily. And lancets 2/day 200 each 3  ? HUMIRA PEN 40 MG/0.4ML PNKT Inject 40 mg into the skin every 14 (fourteen) days.    ? hydrOXYzine (ATARAX/VISTARIL) 25 MG tablet Take 25  mg by mouth every morning.    ? Insulin Lispro Prot & Lispro (HUMALOG 75/25 MIX) (75-25) 100 UNIT/ML Kwikpen Inject 35 Units into the skin 2 (two) times daily with a meal. (Patient taking differently: Inj

## 2021-04-26 ENCOUNTER — Encounter (HOSPITAL_COMMUNITY): Payer: Self-pay

## 2021-04-26 ENCOUNTER — Inpatient Hospital Stay (HOSPITAL_COMMUNITY): Payer: Medicare Other

## 2021-04-26 ENCOUNTER — Emergency Department (HOSPITAL_COMMUNITY): Payer: Medicare Other

## 2021-04-26 ENCOUNTER — Other Ambulatory Visit: Payer: Self-pay

## 2021-04-26 ENCOUNTER — Inpatient Hospital Stay (HOSPITAL_COMMUNITY)
Admission: EM | Admit: 2021-04-26 | Discharge: 2021-04-27 | DRG: 189 | Disposition: A | Discharge status: Home / Self Care | Payer: Medicare Other | Attending: Internal Medicine | Admitting: Internal Medicine

## 2021-04-26 DIAGNOSIS — Z6841 Body Mass Index (BMI) 40.0 and over, adult: Secondary | ICD-10-CM | POA: Diagnosis not present

## 2021-04-26 DIAGNOSIS — G4733 Obstructive sleep apnea (adult) (pediatric): Secondary | ICD-10-CM | POA: Diagnosis present

## 2021-04-26 DIAGNOSIS — G8929 Other chronic pain: Secondary | ICD-10-CM | POA: Diagnosis present

## 2021-04-26 DIAGNOSIS — F419 Anxiety disorder, unspecified: Secondary | ICD-10-CM | POA: Diagnosis present

## 2021-04-26 DIAGNOSIS — I509 Heart failure, unspecified: Principal | ICD-10-CM

## 2021-04-26 DIAGNOSIS — Z79891 Long term (current) use of opiate analgesic: Secondary | ICD-10-CM

## 2021-04-26 DIAGNOSIS — Z885 Allergy status to narcotic agent status: Secondary | ICD-10-CM | POA: Diagnosis not present

## 2021-04-26 DIAGNOSIS — I160 Hypertensive urgency: Secondary | ICD-10-CM | POA: Diagnosis present

## 2021-04-26 DIAGNOSIS — Z833 Family history of diabetes mellitus: Secondary | ICD-10-CM

## 2021-04-26 DIAGNOSIS — J810 Acute pulmonary edema: Secondary | ICD-10-CM | POA: Diagnosis not present

## 2021-04-26 DIAGNOSIS — E875 Hyperkalemia: Secondary | ICD-10-CM | POA: Diagnosis present

## 2021-04-26 DIAGNOSIS — Z888 Allergy status to other drugs, medicaments and biological substances status: Secondary | ICD-10-CM

## 2021-04-26 DIAGNOSIS — R918 Other nonspecific abnormal finding of lung field: Secondary | ICD-10-CM | POA: Diagnosis present

## 2021-04-26 DIAGNOSIS — Z992 Dependence on renal dialysis: Secondary | ICD-10-CM

## 2021-04-26 DIAGNOSIS — M059 Rheumatoid arthritis with rheumatoid factor, unspecified: Secondary | ICD-10-CM | POA: Diagnosis present

## 2021-04-26 DIAGNOSIS — J9601 Acute respiratory failure with hypoxia: Principal | ICD-10-CM | POA: Diagnosis present

## 2021-04-26 DIAGNOSIS — I120 Hypertensive chronic kidney disease with stage 5 chronic kidney disease or end stage renal disease: Secondary | ICD-10-CM | POA: Diagnosis present

## 2021-04-26 DIAGNOSIS — Z83438 Family history of other disorder of lipoprotein metabolism and other lipidemia: Secondary | ICD-10-CM

## 2021-04-26 DIAGNOSIS — E8770 Fluid overload, unspecified: Secondary | ICD-10-CM | POA: Diagnosis present

## 2021-04-26 DIAGNOSIS — Z8616 Personal history of COVID-19: Secondary | ICD-10-CM | POA: Diagnosis not present

## 2021-04-26 DIAGNOSIS — N186 End stage renal disease: Secondary | ICD-10-CM | POA: Diagnosis present

## 2021-04-26 DIAGNOSIS — F17210 Nicotine dependence, cigarettes, uncomplicated: Secondary | ICD-10-CM | POA: Diagnosis present

## 2021-04-26 DIAGNOSIS — I7121 Aneurysm of the ascending aorta, without rupture: Secondary | ICD-10-CM | POA: Diagnosis present

## 2021-04-26 DIAGNOSIS — G2581 Restless legs syndrome: Secondary | ICD-10-CM | POA: Diagnosis present

## 2021-04-26 DIAGNOSIS — D849 Immunodeficiency, unspecified: Secondary | ICD-10-CM | POA: Diagnosis present

## 2021-04-26 DIAGNOSIS — Z79899 Other long term (current) drug therapy: Secondary | ICD-10-CM

## 2021-04-26 DIAGNOSIS — G9341 Metabolic encephalopathy: Secondary | ICD-10-CM | POA: Diagnosis present

## 2021-04-26 DIAGNOSIS — I2510 Atherosclerotic heart disease of native coronary artery without angina pectoris: Secondary | ICD-10-CM | POA: Diagnosis present

## 2021-04-26 DIAGNOSIS — E1151 Type 2 diabetes mellitus with diabetic peripheral angiopathy without gangrene: Secondary | ICD-10-CM | POA: Diagnosis present

## 2021-04-26 DIAGNOSIS — E1165 Type 2 diabetes mellitus with hyperglycemia: Secondary | ICD-10-CM | POA: Diagnosis present

## 2021-04-26 DIAGNOSIS — E1122 Type 2 diabetes mellitus with diabetic chronic kidney disease: Secondary | ICD-10-CM | POA: Diagnosis present

## 2021-04-26 DIAGNOSIS — Z8249 Family history of ischemic heart disease and other diseases of the circulatory system: Secondary | ICD-10-CM

## 2021-04-26 DIAGNOSIS — Z20822 Contact with and (suspected) exposure to covid-19: Secondary | ICD-10-CM | POA: Diagnosis present

## 2021-04-26 DIAGNOSIS — D631 Anemia in chronic kidney disease: Secondary | ICD-10-CM | POA: Diagnosis present

## 2021-04-26 DIAGNOSIS — E669 Obesity, unspecified: Secondary | ICD-10-CM | POA: Diagnosis present

## 2021-04-26 DIAGNOSIS — Z9115 Patient's noncompliance with renal dialysis: Secondary | ICD-10-CM

## 2021-04-26 DIAGNOSIS — Z818 Family history of other mental and behavioral disorders: Secondary | ICD-10-CM

## 2021-04-26 DIAGNOSIS — Z823 Family history of stroke: Secondary | ICD-10-CM

## 2021-04-26 DIAGNOSIS — E1140 Type 2 diabetes mellitus with diabetic neuropathy, unspecified: Secondary | ICD-10-CM | POA: Diagnosis present

## 2021-04-26 DIAGNOSIS — Z794 Long term (current) use of insulin: Secondary | ICD-10-CM

## 2021-04-26 DIAGNOSIS — I714 Abdominal aortic aneurysm, without rupture, unspecified: Secondary | ICD-10-CM

## 2021-04-26 DIAGNOSIS — Z87892 Personal history of anaphylaxis: Secondary | ICD-10-CM

## 2021-04-26 DIAGNOSIS — E7800 Pure hypercholesterolemia, unspecified: Secondary | ICD-10-CM | POA: Diagnosis present

## 2021-04-26 HISTORY — DX: Rheumatoid arthritis, unspecified: M06.9

## 2021-04-26 LAB — IRON AND TIBC
Iron: 85 ug/dL (ref 28–170)
Saturation Ratios: 37 % — ABNORMAL HIGH (ref 10.4–31.8)
TIBC: 227 ug/dL — ABNORMAL LOW (ref 250–450)
UIBC: 142 ug/dL

## 2021-04-26 LAB — RETICULOCYTES
Immature Retic Fract: 20.6 % — ABNORMAL HIGH (ref 2.3–15.9)
RBC.: 2.38 MIL/uL — ABNORMAL LOW (ref 3.87–5.11)
Retic Count, Absolute: 44 10*3/uL (ref 19.0–186.0)
Retic Ct Pct: 1.9 % (ref 0.4–3.1)

## 2021-04-26 LAB — MRSA NEXT GEN BY PCR, NASAL: MRSA by PCR Next Gen: NOT DETECTED

## 2021-04-26 LAB — CBC WITH DIFFERENTIAL/PLATELET
Abs Immature Granulocytes: 0.07 10*3/uL (ref 0.00–0.07)
Basophils Absolute: 0 10*3/uL (ref 0.0–0.1)
Basophils Relative: 0 %
Eosinophils Absolute: 0.3 10*3/uL (ref 0.0–0.5)
Eosinophils Relative: 4 %
HCT: 23.9 % — ABNORMAL LOW (ref 36.0–46.0)
Hemoglobin: 7.5 g/dL — ABNORMAL LOW (ref 12.0–15.0)
Immature Granulocytes: 1 %
Lymphocytes Relative: 16 %
Lymphs Abs: 1.2 10*3/uL (ref 0.7–4.0)
MCH: 31.3 pg (ref 26.0–34.0)
MCHC: 31.4 g/dL (ref 30.0–36.0)
MCV: 99.6 fL (ref 80.0–100.0)
Monocytes Absolute: 0.5 10*3/uL (ref 0.1–1.0)
Monocytes Relative: 6 %
Neutro Abs: 5.8 10*3/uL (ref 1.7–7.7)
Neutrophils Relative %: 73 %
Platelets: 189 10*3/uL (ref 150–400)
RBC: 2.4 MIL/uL — ABNORMAL LOW (ref 3.87–5.11)
RDW: 14.5 % (ref 11.5–15.5)
WBC: 7.9 10*3/uL (ref 4.0–10.5)
nRBC: 0 % (ref 0.0–0.2)

## 2021-04-26 LAB — BASIC METABOLIC PANEL
Anion gap: 15 (ref 5–15)
BUN: 74 mg/dL — ABNORMAL HIGH (ref 6–20)
CO2: 22 mmol/L (ref 22–32)
Calcium: 8.2 mg/dL — ABNORMAL LOW (ref 8.9–10.3)
Chloride: 96 mmol/L — ABNORMAL LOW (ref 98–111)
Creatinine, Ser: 8.92 mg/dL — ABNORMAL HIGH (ref 0.44–1.00)
GFR, Estimated: 5 mL/min — ABNORMAL LOW (ref >60)
Glucose, Bld: 262 mg/dL — ABNORMAL HIGH (ref 70–99)
Potassium: 5.9 mmol/L — ABNORMAL HIGH (ref 3.5–5.1)
Sodium: 133 mmol/L — ABNORMAL LOW (ref 135–145)

## 2021-04-26 LAB — GLUCOSE, CAPILLARY
Glucose-Capillary: 106 mg/dL — ABNORMAL HIGH (ref 70–99)
Glucose-Capillary: 114 mg/dL — ABNORMAL HIGH (ref 70–99)
Glucose-Capillary: 180 mg/dL — ABNORMAL HIGH (ref 70–99)
Glucose-Capillary: 223 mg/dL — ABNORMAL HIGH (ref 70–99)

## 2021-04-26 LAB — HEPATITIS B SURFACE ANTIBODY,QUALITATIVE: Hep B S Ab: REACTIVE — AB

## 2021-04-26 LAB — HEMOGLOBIN A1C
Hgb A1c MFr Bld: 11.3 % — ABNORMAL HIGH (ref 4.8–5.6)
Mean Plasma Glucose: 277.61 mg/dL

## 2021-04-26 LAB — FERRITIN: Ferritin: 1408 ng/mL — ABNORMAL HIGH (ref 11–307)

## 2021-04-26 LAB — HEPATITIS B SURFACE ANTIGEN: Hepatitis B Surface Ag: NONREACTIVE

## 2021-04-26 LAB — RESP PANEL BY RT-PCR (FLU A&B, COVID) ARPGX2
Influenza A by PCR: NEGATIVE
Influenza B by PCR: NEGATIVE
SARS Coronavirus 2 by RT PCR: NEGATIVE

## 2021-04-26 LAB — HIV ANTIBODY (ROUTINE TESTING W REFLEX): HIV Screen 4th Generation wRfx: NONREACTIVE

## 2021-04-26 MED ORDER — CHLORHEXIDINE GLUCONATE CLOTH 2 % EX PADS
6.0000 | MEDICATED_PAD | Freq: Every day | CUTANEOUS | Status: DC
  Administered 2021-04-26: 6 via TOPICAL

## 2021-04-26 MED ORDER — INSULIN DETEMIR 100 UNIT/ML ~~LOC~~ SOLN
10.0000 [IU] | Freq: Two times a day (BID) | SUBCUTANEOUS | Status: DC
  Administered 2021-04-26: 10 [IU] via SUBCUTANEOUS
  Filled 2021-04-26 (×3): qty 0.1

## 2021-04-26 MED ORDER — NITROGLYCERIN IN D5W 200-5 MCG/ML-% IV SOLN
0.0000 ug/min | INTRAVENOUS | Status: DC
  Administered 2021-04-26: 5 ug/min via INTRAVENOUS
  Filled 2021-04-26: qty 250

## 2021-04-26 MED ORDER — DOCUSATE SODIUM 100 MG PO CAPS: 100.0000 mg | ORAL_CAPSULE | Freq: Two times a day (BID) | ORAL | Status: DC | PRN

## 2021-04-26 MED ORDER — ORAL CARE MOUTH RINSE: 15.0000 mL | Freq: Two times a day (BID) | OROMUCOSAL | Status: DC

## 2021-04-26 MED ORDER — HEPARIN SODIUM (PORCINE) 1000 UNIT/ML DIALYSIS
3000.0000 [IU] | INTRAMUSCULAR | Status: DC | PRN
Start: 2021-04-26 — End: 2021-04-27
  Filled 2021-04-26 (×2): qty 3

## 2021-04-26 MED ORDER — POLYETHYLENE GLYCOL 3350 17 G PO PACK: 17.0000 g | PACK | Freq: Every day | ORAL | Status: DC | PRN

## 2021-04-26 MED ORDER — CHLORHEXIDINE GLUCONATE 0.12 % MT SOLN
15.0000 mL | Freq: Two times a day (BID) | OROMUCOSAL | Status: DC
  Administered 2021-04-26: 15 mL via OROMUCOSAL
  Filled 2021-04-26 (×2): qty 15

## 2021-04-26 MED ORDER — CHLORHEXIDINE GLUCONATE CLOTH 2 % EX PADS
6.0000 | MEDICATED_PAD | Freq: Every day | CUTANEOUS | Status: DC
Start: 2021-04-26 — End: 2021-04-27
  Administered 2021-04-26: 6 via TOPICAL

## 2021-04-26 MED ORDER — ROSUVASTATIN CALCIUM 20 MG PO TABS
40.0000 mg | ORAL_TABLET | Freq: Every day | ORAL | Status: DC
  Administered 2021-04-26 – 2021-04-27 (×2): 40 mg via ORAL
  Filled 2021-04-26 (×2): qty 2

## 2021-04-26 MED ORDER — INSULIN ASPART 100 UNIT/ML IJ SOLN
0.0000 [IU] | INTRAMUSCULAR | Status: DC
  Administered 2021-04-26: 2 [IU] via SUBCUTANEOUS
  Administered 2021-04-27: 3 [IU] via SUBCUTANEOUS

## 2021-04-26 MED ORDER — IPRATROPIUM BROMIDE 0.02 % IN SOLN: 0.5000 mg | RESPIRATORY_TRACT | Status: DC | PRN

## 2021-04-26 MED ORDER — PRAMIPEXOLE DIHYDROCHLORIDE 0.25 MG PO TABS
0.5000 mg | ORAL_TABLET | Freq: Every day | ORAL | Status: DC
  Administered 2021-04-26 – 2021-04-27 (×2): 0.5 mg via ORAL
  Filled 2021-04-26 (×2): qty 2

## 2021-04-26 MED ORDER — HEPARIN SODIUM (PORCINE) 5000 UNIT/ML IJ SOLN
5000.0000 [IU] | Freq: Three times a day (TID) | INTRAMUSCULAR | Status: DC
  Administered 2021-04-26 – 2021-04-27 (×2): 5000 [IU] via SUBCUTANEOUS
  Filled 2021-04-26: qty 1

## 2021-04-26 MED ORDER — ALBUMIN HUMAN 25 % IV SOLN: 25.0000 g | Freq: Once | INTRAVENOUS | Status: DC

## 2021-04-26 MED ORDER — SODIUM CHLORIDE 0.9 % IV SOLN: INTRAVENOUS | Status: DC

## 2021-04-26 NOTE — Progress Notes (Signed)
eLink Physician-Brief Progress Note ?Patient Name: [name] ?DOB: 03-05-1965 ?MRN: [id number] ? ? ?Date of Service ? 04/26/2021  ?HPI/Events of Note ? Request for diet - Patient was NPO while on BiPAP. Now off of BiPAP. Patient passed bedside swallow study. History ESRD on DM.  ?eICU Interventions ? Plan: ?Advance diet to Renal/Carb modified diet with fluid restriction.   ? ? ? ?Intervention Category ?Major Interventions: Other: ? ?Johnrobert Foti Cornelia Copa ?04/26/2021, 9:54 PM ?

## 2021-04-26 NOTE — Procedures (Signed)
Central Venous Catheter Insertion Procedure Note ? ?[name]  ?[id number]  ?06/21/1965 ? ?Date:04/26/21  ?Time:5:36 PM  ? ?Provider Performing:Kriya Westra Darrick Meigs  ? ?Procedure: Insertion of Non-tunneled Central Venous Catheter(36556) with US guidance (30076)  ? ?Indication(s) ?Medication administration and Difficult access ? ?Consent ?Risks of the procedure as well as the alternatives and risks of each were explained to the patient and/or caregiver.  Consent for the procedure was obtained and is signed in the bedside chart ? ?Anesthesia ?Topical only with 1% lidocaine  ? ?Timeout ?Verified patient identification, verified procedure, site/side was marked, verified correct patient position, special equipment/implants available, medications/allergies/relevant history reviewed, required imaging and test results available. ? ?Sterile Technique ?Maximal sterile technique including full sterile barrier drape, hand hygiene, sterile gown, sterile gloves, mask, hair covering, sterile ultrasound probe cover (if used). ? ?Procedure Description ?Area of catheter insertion was cleaned with chlorhexidine and draped in sterile fashion.  With real-time ultrasound guidance a central venous catheter was placed into the right internal jugular vein. Nonpulsatile blood flow and easy flushing noted in all ports.  The catheter was sutured in place and sterile dressing applied. ? ?Complications/Tolerance ?None; patient tolerated the procedure well. ?Chest X-ray is ordered to verify placement for internal jugular or subclavian cannulation.   Chest x-ray is not ordered for femoral cannulation. ? ?EBL ?Minimal ? ?Specimen(s) ?None  ? ?Mitzi Hansen, MD ?Internal Medicine Resident PGY-3 ?[facility] ?Pager: [phone] ?04/26/2021 5:36 PM  ?  ?

## 2021-04-26 NOTE — ED Provider Notes (Addendum)
?[facility] ?Provider Note ? ? ?CSN: [id number] ?Arrival date & time: 04/26/21  1058 ? ?  ? ?History ? ?No chief complaint on file. ? ? ?[name] is a 56 y.o. female. ? ?56 year old female who presents with shortness of breath.  Patient has end-stage renal disease and was last dialyzed several days ago.  Tried to go to dialysis yesterday but was unable to.  Patient is normally scheduled for dialysis Monday Wednesday Friday.  Has noted increasing dyspnea exertion x1 day.  No fever or chills.  Slight cough noted.  No chest pain or chest pressure. ? ? ?  ? ?Home Medications ?Prior to Admission medications   ?Medication Sig Start Date End Date Taking? Authorizing Provider  ?acetaminophen (TYLENOL) 500 MG tablet Take 500-1,000 mg by mouth daily as needed for headache (pain).    [provider]  ?albuterol (VENTOLIN HFA) 108 (90 Base) MCG/ACT inhaler Inhale 2 puffs into the lungs every 6 (six) hours as needed for wheezing or shortness of breath. 02/22/20   Thurnell Lose, MD  ?Ascorbic Acid (VITAMIN C PO) Take 1 tablet by mouth See admin instructions. At dialysis [facility] and friday    [provider]  ?AURYXIA 1 GM 210 MG(Fe) tablet Take 840-1,050 mg by mouth See admin instructions. Take 5 tablets (1050 mg) by mouth twice daily with meals, occasionally take 4 tablets (840 mg) with a snack 05/29/18   [provider]  ?b complex-vitamin c-folic acid (NEPHRO-VITE) 0.8 MG TABS tablet Take 1 tablet by mouth every Monday, Wednesday, and Friday with hemodialysis. 04/12/19   [provider]  ?glucose blood (ONETOUCH VERIO) test strip 1 each by Other route 2 (two) times daily. And lancets 2/day 04/10/20   Renato Shin, MD  ?HUMIRA PEN 40 MG/0.4ML PNKT Inject 40 mg into the skin every 14 (fourteen) days. 08/22/20   [provider]  ?hydrOXYzine (ATARAX/VISTARIL) 25 MG tablet Take 25 mg by mouth every morning.    [provider]  ?Insulin Lispro Prot & Lispro (HUMALOG 75/25 MIX) (75-25) 100 UNIT/ML Kwikpen Inject 35 Units into the skin 2 (two) times daily with a meal. ?Patient taking differently: Inject 35 Units into the skin in the morning and at bedtime. 05/01/20   Renato Shin, MD  ?Methoxy PEG-Epoetin Beta (MIRCERA IJ) Dialysis Monday,Wednesday and friday 12/19/20 12/18/21  [provider]  ?metoprolol succinate (TOPROL-XL) 100 MG 24 hr tablet Take 100 mg by mouth daily. Take with or immediately following a meal.    [provider]  ?Multiple Vitamins-Minerals (ZINC PO) Take 1 tablet by mouth every morning.    [provider]  ?NARCAN 4 MG/0.1ML LIQD nasal spray kit Place 1 spray into the nose as needed (accidental overdose.).  10/10/19   [provider]  ?OVER THE COUNTER MEDICATION Take 1 capsule by mouth every morning. Omega XL    [provider]  ?oxyCODONE (ROXICODONE) 5 MG immediate release tablet 1 tab PO q6 hours prn pain 01/15/21   Leanora Cover, MD  ?oxyCODONE-acetaminophen (PERCOCET) 10-325 MG tablet Take 1 tablet by mouth 3 (three) times daily as needed for pain. 11/04/20   [provider]  ?pramipexole (MIRAPEX) 0.5 MG tablet Take 0.5 mg by mouth daily.  05/18/17   [provider]  ?rosuvastatin (CRESTOR) 40 MG tablet TAKE 1 TABLET BY MOUTH DAILY *PATIENT NEEDS APPOINTMENT* ?Patient taking differently: Take 40 mg by mouth daily. 12/08/20   Minus Breeding, MD  ?   ? ?  Allergies    ?Bupropion and Dilaudid [hydromorphone]   ? ?Review of Systems   ?Review of Systems  ?All other systems reviewed and are negative. ? ?Physical Exam ?Updated Vital Signs ?LMP  (LMP Unknown) Comment: LMP Feb 2011 ?Physical Exam ?Vitals and nursing note reviewed.  ?Constitutional:   ?   General: She is not in acute distress. ?   Appearance: Normal appearance. She is well-developed. She is not toxic-appearing.  ?HENT:  ?   Head: Normocephalic and atraumatic.  ?Eyes:  ?   General:  Lids are normal.  ?   Conjunctiva/sclera: Conjunctivae normal.  ?   Pupils: Pupils are equal, round, and reactive to light.  ?Neck:  ?   Thyroid: No thyroid mass.  ?   Trachea: No tracheal deviation.  ?Cardiovascular:  ?   Rate and Rhythm: Normal rate and regular rhythm.  ?   Heart sounds: Normal heart sounds. No murmur heard. ?  No gallop.  ?Pulmonary:  ?   Effort: Tachypnea and respiratory distress present.  ?   Breath sounds: No stridor. Examination of the right-upper field reveals decreased breath sounds. Examination of the left-upper field reveals decreased breath sounds. Decreased breath sounds present. No wheezing, rhonchi or rales.  ?Abdominal:  ?   General: There is no distension.  ?   Palpations: Abdomen is soft.  ?   Tenderness: There is no abdominal tenderness. There is no rebound.  ?Musculoskeletal:     ?   General: No tenderness. Normal range of motion.  ?   Cervical back: Normal range of motion and neck supple.  ?Skin: ?   General: Skin is warm and dry.  ?   Findings: No abrasion or rash.  ?Neurological:  ?   Mental Status: She is alert and oriented to person, place, and time. Mental status is at baseline.  ?   GCS: GCS eye subscore is 4. GCS verbal subscore is 5. GCS motor subscore is 6.  ?   Cranial Nerves: No cranial nerve deficit.  ?   Sensory: No sensory deficit.  ?   Motor: Motor function is intact.  ?Psychiatric:     ?   Attention and Perception: Attention normal.     ?   Speech: Speech normal.     ?   Behavior: Behavior normal.  ? ? ?ED Results / Procedures / Treatments   ?Labs ?(all labs ordered are listed, but only abnormal results are displayed) ?Labs Reviewed  ?RESP PANEL BY RT-PCR (FLU A&B, COVID) [id number]  ?CBC WITH DIFFERENTIAL/PLATELET  ?BASIC METABOLIC PANEL  ? ? ?EKG ?EKG Interpretation ? ?Date/Time:  Sunday April 26 2021 11:04:33 EDT ?Ventricular Rate:  79 ?PR Interval:  254 ?QRS Duration: 156 ?QT Interval:  434 ?QTC Calculation: 498 ?R Axis:   91 ?Text Interpretation: Sinus rhythm  Prolonged PR interval RBBB and LPFB No significant change since last tracing Confirmed by Lacretia Leigh ([id number]) on 04/26/2021 11:14:39 AM ? ?Radiology ?No results found. ? ?Procedures ?Procedures  ? ? ?Medications Ordered in ED ?Medications  ?0.9 %  sodium chloride infusion (has no administration in time range)  ? ? ?ED Course/ Medical Decision Making/ A&P ?  ?                        ?Medical Decision Making ?Amount and/or Complexity of Data Reviewed ?Labs: ordered. ?Radiology: ordered. ? ?Risk ?Prescription drug management. ?Decision regarding hospitalization. ? ?Patient initially presented require oxygen at 6 L with nasal cannula. ?  Patient presented with likely fluid overload.  Checks x-ray per my interpretation shows CHF.  EKG per my interpretation shows no signs of severe hyperkalemia.  Electrolytes do show hyperkalemia.  Case discussed with Dr. Soyla Murphy from nephrology who will come and see the patient and arrange for dialysis ? ?1:13 PM ?Patient seen by nephrology and due to worsening respiratory status was placed on BiPAP.  Patient needs emergent dialysis and will require ICU admission.  I spoke with the ICU team and they will come to meet the patient ? ?CRITICAL CARE ?Performed by: Leota Jacobsen ?Total critical care time: 55 minutes ?Critical care time was exclusive of separately billable procedures and treating other patients. ?Critical care was necessary to treat or prevent imminent or life-threatening deterioration. ?Critical care was time spent personally by me on the following activities: development of treatment plan with patient and/or surrogate as well as nursing, discussions with consultants, evaluation of patient's response to treatment, examination of patient, obtaining history from patient or surrogate, ordering and performing treatments and interventions, ordering and review of laboratory studies, ordering and review of radiographic studies, pulse oximetry and re-evaluation of patient's  condition. ? ? ? ? ? ? ? ? ?Final Clinical Impression(s) / ED Diagnoses ?Final diagnoses:  ?None  ? ? ?Rx / DC Orders ?ED Discharge Orders   ? ? None  ? ?  ? ? ?  ?Lacretia Leigh, MD ?04/26/21 1233 ? ?  ?Lacretia Leigh, Jerilynn Mages

## 2021-04-26 NOTE — H&P (Signed)
? ?NAME:  [name], MRN:  [id number], DOB:  Oct 07, 1965, LOS: 0 ?ADMISSION DATE:  04/26/2021, CONSULTATION DATE:  04/26/21 ?REFERRING MD:  Dr. Melvia Heaps, CHIEF COMPLAINT: Shortness of breath ? ?History of Present Illness:  ?[name] is a 56 year old female with past medical history of type 2 diabetes, ESRD on HD MWF, RA, hyperlipidemia, iron deficiency anemia, mild OSA, chronic pain on opioids, who was admitted to the hospital for shortness of breath.  She missed her dissection last Friday.  Chest x-ray shows bilateral pulmonary edema.  She was started on nonrebreather and transition to BiPAP for worsening respiratory status.  She was too unstable for HD floor so she will be dialyzed in the ICU. ? ?Pertinent  Medical History  ? ?Past Medical History:  ?Diagnosis Date  ? Anaphylactic shock, unspecified, initial encounter 09/04/2018  ? Anemia   ? Anxiety   ? Arthritis   ? "knees" "hands", "RA"  ? CAD (coronary artery disease)   ? Nonobstructive on CT 2019  ? CHF (congestive heart failure) ([place])   ? Chronic bilateral pleural effusions 10/23/2018  ? COVID-19 10/2018  ? COVID-19 virus detected 10/10/2018  ? COVID-19 virus infection 09/18/2018  ? ESRD (end stage renal disease) ([place])   ? Dialysis [facility]- 3[address]  ? 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 Headache(784.0)   ? High cholesterol   ? History of blood transfusion   ? Hypertension   ? Mitral regurgitation   ? moderate to severe MR 10/2018 echo  ? Nerve pain   ? "they say I have L5 nerve damage; my lumbar"  ? Neuropathy 01/29/2011  ? 04/2011 MRI L-spine:  L5-S1: Bulge/shallow broad-based protrusion greatest centrally and in the right posterior lateral position. Minimal crowding of the  upper aspect of the S1 nerve root greater on the right. Left lateral disc osteophyte with mild encroachment upon but not  significant compression of the exiting left L5 nerve root.   05/2011: Dr. Sherwood Gambler ([facility]): DJD and mild disc bu  ? Pericardial effusion   ? Pneumonia   ?  ; 11/16/2018- "touch of pneumonia" - seen in ED- 11/14/2018- on antibiotic. Has had it x 2  ? Pneumonia 10/10/2018  ? PVD (peripheral vascular disease) ([place])   ? Right leg stent in [place].  (No records)  ? Restless legs   ? Sleep apnea   ? does not use Cpap  ? Thoracic ascending aortic aneurysm   ? 4.4 cm 11/14/18 CTA  ? Type II diabetes mellitus ([place])   ? Uterine leiomyoma 01/10/2021  ?  ? ?Significant Hospital Events: ?Including procedures, antibiotic start and stop dates in addition to other pertinent events   ?3/26: put on Bipap ? ?Interim History / Subjective:  ? ?Patient is somnolent but awakes to verbal stimulation.  Said that she only missed 1 section.  Limited information obtained due to BiPAP mask. ? ?Objective   ?Blood pressure (!) 119/25, pulse 81, resp. rate 20, SpO2 100 %. ?   ?   ?No intake or output data in the 24 hours ending 04/26/21 1321 ?There were no vitals filed for this visit. ? ?Examination: ?General: Chronically ill-appearing female.  Somnolent and fatigued appearance ?HENT: Dry mucous mucosa.  Sclera anicteric ?Lungs: Diminished breath sounds bilateral lung bases. ?Cardiovascular: Regular rate rhythm.  No murmur.  Unable to assess for DVT given body habitus. ?Abdomen: Soft, nontender, bowel sound presents ?Extremities: +1 edema bilateral lower extremity. RUE fistula palpable thrill palpated.  The site appears clean and noninfected. ?  Neuro: Somnolent but awakes to verbal stimulation.  Could not assess for orientation given BiPAP mask. ? ? ?Resolved Hospital Problem list   ? ? ?Assessment & Plan:  ?Acute hypoxic respiratory failure require Bipap 2/2 pulmonary edema 2/2 missed HD ?ESRD on HD ?Appears volume overloaded on exam.  Chest x-ray showed bilateral pulmonary edema.  Plan for HD this afternoon in the ICU while on BiPAP. ?-Nephrology is following ?-Continue nitro drip for afterload reduction ?-Recheck ABG this afternoon after dialysis.  Suspect she a chronic CO2 retainer ? ?Acute  metabolic encephalopathy 2/2 uremia ?Uremia ?Hyperkalemia ?BUN of 74 ?-HD this afternoon ?-N.p.o. for now.  Reassess mental status and swallow ability before starting diet. ? ?Hypertension ?Home medication include Toprol 100 mg daily. ?Currently on nitro drip for hypertension and afterload reduction ?-Continue nitro drip for now until getting HD.   ?-Will resume Toprol later ? ?Normocytic anemia ?History of iron deficiency anemia ?She is on iron supplement at home.  Hemoglobin trending down since February. ?-Will check iron study ?-EPO per nephrology ?-CBC in a.m. ? ?Type 2 diabetes ?Home regimen include Humalog 35 u BID ?Repeat A1c ?-Start Levemir 10 units twice daily with sliding scale for now ? ?Bilateral lung nodules ?Right posterior renal cell carcinoma ?Today no finding on CTA 11/2020, suspicious for metastatic disease from her Right posterior renal cell carcinoma.  She saw Dr. Lamonte Sakai on 04/2021 and was scheduled for a bronchoscopy. ?There was also questionable endocarditis but less likely given the absence of fever or leukocytosis.  She does have osteomyelitis of the left index finger but her last several blood culture has been negative.  She however is immunosuppressed with Humira which can cause typical infection. ?-Will hold off on antibiotics.   ?-Repeat echocardiogram. ?-We may plan for bronchoscopy this admission ? ?Rheumatoid arthritis ?She is taking Humira injection every 2 weeks. Refilled consistently per pharmacy. Last filled 3/23 with unknown last injected dose.  ?-Will obtain more information when patient is more awake ?-Appreciate pharmacy assistance ? ?Chronic pain on opioids ?-Holding opioids for now as setting of encephalopathy ? ?Mild OSA ?No indication for CPAP ? ?Best Practice (right click and "Reselect all SmartList Selections" daily)  ? ?Diet/type: NPO ?DVT prophylaxis: prophylactic heparin  ?GI prophylaxis: N/A ?Lines: N/A ?Foley:  N/A ?Code Status:  full code ?Last date of  multidisciplinary goals of care discussion [will update family] ? ?Labs   ?CBC: ?Recent Labs  ?Lab 04/26/21 ?1138  ?WBC 7.9  ?NEUTROABS 5.8  ?HGB 7.5*  ?HCT 23.9*  ?MCV 99.6  ?PLT 189  ? ? ?Basic Metabolic Panel: ?Recent Labs  ?Lab 04/26/21 ?1138  ?NA 133*  ?K 5.9*  ?CL 96*  ?CO2 22  ?GLUCOSE 262*  ?BUN 74*  ?CREATININE 8.92*  ?CALCIUM 8.2*  ? ?GFR: ?Estimated Creatinine Clearance: 9.1 mL/min (A) (by C-G formula based on SCr of 8.92 mg/dL (H)). ?Recent Labs  ?Lab 04/26/21 ?1138  ?WBC 7.9  ? ? ?Liver Function Tests: ?No results for input(s): AST, ALT, ALKPHOS, BILITOT, PROT, ALBUMIN in the last 168 hours. ?No results for input(s): LIPASE, AMYLASE in the last 168 hours. ?No results for input(s): AMMONIA in the last 168 hours. ? ?ABG ?   ?Component Value Date/Time  ? PHART 7.284 (L) 11/25/2020 1254  ? PCO2ART 63.2 (H) 11/25/2020 1254  ? PO2ART 41 (L) 11/25/2020 1254  ? HCO3 30.0 (H) 11/25/2020 1254  ? TCO2 27 01/15/2021 0830  ? O2SAT 69.0 11/25/2020 1254  ?  ? ?Coagulation Profile: ?No results for  input(s): INR, PROTIME in the last 168 hours. ? ?Cardiac Enzymes: ?No results for input(s): CKTOTAL, CKMB, CKMBINDEX, TROPONINI in the last 168 hours. ? ?HbA1C: ?Hemoglobin A1C  ?Date/Time Value Ref Range Status  ?05/01/2020 08:28 AM 9.7 (A) 4.0 - 5.6 % Final  ? ?Hgb A1c MFr Bld  ?Date/Time Value Ref Range Status  ?02/18/2020 07:06 PM 8.5 (H) 4.8 - 5.6 % Final  ?  Comment:  ?  (NOTE) ?Pre diabetes:          5.7%-6.4% ? ?Diabetes:              >6.4% ? ?Glycemic control for   <7.0% ?adults with diabetes ?  ?09/19/2018 08:33 AM 8.7 (H) 4.8 - 5.6 % Final  ?  Comment:  ?  (NOTE) ?Pre diabetes:          5.7%-6.4% ?Diabetes:              >6.4% ?Glycemic control for   <7.0% ?adults with diabetes ?  ? ? ?CBG: ?No results for input(s): GLUCAP in the last 168 hours. ? ?Review of Systems:   ?Unable to obtain due to mental status ? ?Past Medical History:  ?She,  has a past medical history of Acute encephalopathy (02/02/1973), Acute  metabolic encephalopathy (8/83/2549), Acute respiratory distress (10/23/2018), Acute respiratory failure with hypoxia ([place]) (09/26/2018), Anaphylactic shock, unspecified, initial encounter (09/04/2018), Anemia, Anxiety, Arthritis

## 2021-04-26 NOTE — ED Notes (Signed)
Critical Care paged '@[phone]'$ :[phone] ?

## 2021-04-26 NOTE — Procedures (Signed)
? ?  I was present at this dialysis session, have reviewed the session itself and made  appropriate changes ?Kelly Splinter MD ?[employer] ?pager [phone]   ?04/26/2021, 4:14 PM ? ? ?

## 2021-04-26 NOTE — ED Triage Notes (Signed)
Pt BIB [facility] from home with c/o SOB since she woke up this AM d/t missed dialysis on Friday. She reports trying to get a dialysis app today but was unable. EMS reports 100% on 12 L O2 while en route, A/Ox4, diminished lung sounds. ?

## 2021-04-26 NOTE — Consult Note (Addendum)
Renal Service ?Consult Note ?[facility] ? ?[name] ?04/26/2021 ?Catherine Blazing, MD ?Requesting Physician: Dr. Zenia Resides ? ?Reason for Consult: ESRD pt w/ resp distress ?HPI: The patient is a 56 y.o. year-old w/ hx of anemia, CAD, ESRD on HD, HL, HTN, DM2, OSA who presented to ED this morning c/o SOB, brought by EMS from home. Woke up this AM SOB. Missed HD on Friday. In ED pt is on NRB mask at 15 L flow rate. BP 140/ 50, HR 80, R 20.   CXR showing mod-severe bilat pulm edema. K+ 4.9, BN 74, glu 262.  WBC 7K  Hb 7.5.  Asked to see for HD.  ? ?Pt somnolent and difficult to arouse.  Oriented to name, not to year "2019". Cannot give any history, too somnolent.  ? ? ?ROS - n/q ? ? ?Past Medical History  ?Past Medical History:  ?Diagnosis Date  ? Acute encephalopathy 10/09/2018  ? Acute metabolic encephalopathy 02/07/2692  ? Acute respiratory distress 10/23/2018  ? Acute respiratory failure with hypoxia ([place]) 09/26/2018  ? Anaphylactic shock, unspecified, initial encounter 09/04/2018  ? Anemia   ? Anxiety   ? Arthritis   ? "knees" "hands", "RA"  ? CAD (coronary artery disease)   ? Nonobstructive on CT 2019  ? CHF (congestive heart failure) ([place])   ? Chronic bilateral pleural effusions 10/23/2018  ? COVID-19 10/2018  ? COVID-19 virus detected 10/10/2018  ? COVID-19 virus infection 09/18/2018  ? Dyspnea   ? "when I have too much fluid."  ? ESRD (end stage renal disease) ([place])   ? Dialysis TTHSat- 3rd st  ? Gangrene ([place]) 09/18/2018  ? Headache(784.0)   ? Heart murmur   ? High cholesterol   ? History of blood transfusion   ? Hypertension   ? Mitral regurgitation   ? moderate to severe MR 10/2018 echo  ? Nerve pain   ? "they say I have L5 nerve damage; my lumbar"  ? Neuropathy 01/29/2011  ? 04/2011 MRI L-spine:  L5-S1: Bulge/shallow broad-based protrusion greatest centrally and in the right posterior lateral position. Minimal crowding of the  upper aspect of the S1 nerve root greater on the right. Left lateral disc  osteophyte with mild encroachment upon but not  significant compression of the exiting left L5 nerve root.   05/2011: Dr. Sherwood Gambler ([facility]): DJD and mild disc bu  ? Pericardial effusion   ? Pneumonia   ? ; 11/16/2018- "touch of pneumonia" - seen in ED- 11/14/2018- on antibiotic. Has had it x 2  ? Pneumonia 10/10/2018  ? PVD (peripheral vascular disease) ([place])   ? Right leg stent in [place].  (No records)  ? Restless legs   ? Sleep apnea   ? does not use Cpap  ? Thoracic ascending aortic aneurysm   ? 4.4 cm 11/14/18 CTA  ? Type II diabetes mellitus ([place])   ? Uterine leiomyoma 01/10/2021  ? ?Past Surgical History  ?Past Surgical History:  ?Procedure Laterality Date  ? AMPUTATION Left 12/27/2018  ? Procedure: LEFT THUMB REVISION AMPUTATION DIGIT;  Surgeon: Charlotte Crumb, MD;  Location: [place];  Service: Orthopedics;  Laterality: Left;  ? AMPUTATION Left 01/15/2021  ? Procedure: AMPUTATION OF DIGIT LEFT INDEX FINGER;  Surgeon: Leanora Cover, MD;  Location: [place];  Service: Orthopedics;  Laterality: Left;  ? AV FISTULA PLACEMENT Left   ? AV FISTULA PLACEMENT Right 03/12/2019  ? Procedure: INSERTION OF ARTERIOVENOUS (AV) GORE-TEX GRAFT ARM;  Surgeon: Elam Dutch,  MD;  Location: [facility];  Service: Vascular;  Laterality: Right;  ? AV FISTULA PLACEMENT Right 08/13/2019  ? Procedure: RIGHT UPPER ARM ARTERIOVENOUS (AV) FISTULA CREATION WITH BASILIC VEIN;  Surgeon: Elam Dutch, MD;  Location: [place];  Service: Vascular;  Laterality: Right;  ? [place]; 1997  ? x 2  ? COLONOSCOPY    ? EYE SURGERY Bilateral   ? cataract surgery  ? I & D EXTREMITY Left 01/09/2021  ? Procedure: IRRIGATION AND DEBRIDEMENT INDEX FINGER;  Surgeon: Leanora Cover, MD;  Location: [place];  Service: Orthopedics;  Laterality: Left;  ? INSERTION OF DIALYSIS CATHETER Right 09/26/2018  ? Procedure: INSERTION OF DIALYSIS CATHETER Right Internal Jugular.;  Surgeon: Elam Dutch, MD;  Location: [facility] OR;  Service: Vascular;   Laterality: Right;  ? IR DIALY SHUNT INTRO NEEDLE/INTRACATH INITIAL W/IMG RIGHT Right 11/21/2020  ? IR FLUORO GUIDE CV LINE RIGHT  05/04/2019  ? IR US GUIDE VASC ACCESS RIGHT  05/04/2019  ? IR US GUIDE VASC ACCESS RIGHT  11/21/2020  ? LIGATION OF ARTERIOVENOUS  FISTULA Left 09/26/2018  ? Procedure: LIGATION OF ARTERIOVENOUS  FISTULA LEFT ARM;  Surgeon: Elam Dutch, MD;  Location: [place];  Service: Vascular;  Laterality: Left;  ? PERICARDIAL WINDOW  02/2004  ? for pericardial effusion  ? PERIPHERAL VASCULAR INTERVENTION Right 10/26/2019  ? Procedure: PERIPHERAL VASCULAR INTERVENTION;  Surgeon: Elam Dutch, MD;  Location: [place] [facility];  Service: Cardiovascular;  Laterality: Right;  arm  AV fistula  ? TUBAL LIGATION  05/1995  ? UPPER EXTREMITY VENOGRAPHY N/A 06/22/2019  ? Procedure: UPPER EXTREMITY VENOGRAPHY - Right Central;  Surgeon: Elam Dutch, MD;  Location: [place] [facility];  Service: Cardiovascular;  Laterality: N/A;  ? ?Family History  ?Family History  ?Problem Relation Age of Onset  ? Cancer Brother   ? Heart disease Father   ?     Died age 96  ? Diabetes Father   ? Hyperlipidemia Father   ? Hypertension Father   ? Stroke Father   ? Diabetes Mother   ? Hypertension Mother   ? Stroke Mother   ? Dementia Mother   ? Diabetes Sister   ? Diabetes Brother   ? Hypertension Sister   ? Hypertension Brother   ? ?Social History  reports that she has been smoking cigarettes. She has a 8.25 pack-year smoking history. She has never used smokeless tobacco. She reports that she does not drink alcohol and does not use drugs. ?Allergies  ?Allergies  ?Allergen Reactions  ? Bupropion Itching  ? Dilaudid [Hydromorphone] Other (See Comments)  ?  Apnea, required intubation  ? ?Home medications ?Prior to Admission medications   ?Medication Sig Start Date End Date Taking? Authorizing Provider  ?acetaminophen (TYLENOL) 500 MG tablet Take 500-1,000 mg by mouth daily as needed for headache (pain).    [provider]  ?albuterol (VENTOLIN HFA) 108 (90 Base) MCG/ACT inhaler Inhale 2 puffs into the lungs every 6 (six) hours as needed for wheezing or shortness of breath. 02/22/20   Thurnell Lose, MD  ?Ascorbic Acid (VITAMIN C PO) Take 1 tablet by mouth See admin instructions. At dialysis [facility] and friday    [provider]  ?AURYXIA 1 GM 210 MG(Fe) tablet Take 840-1,050 mg by mouth See admin instructions. Take 5 tablets (1050 mg) by mouth twice daily with meals, occasionally take 4 tablets (840 mg) with a snack 05/29/18   [provider]  ?  b complex-vitamin c-folic acid (NEPHRO-VITE) 0.8 MG TABS tablet Take 1 tablet by mouth every Monday, Wednesday, and Friday with hemodialysis. 04/12/19   [provider]  ?glucose blood (ONETOUCH VERIO) test strip 1 each by Other route 2 (two) times daily. And lancets 2/day 04/10/20   Renato Shin, MD  ?HUMIRA PEN 40 MG/0.4ML PNKT Inject 40 mg into the skin every 14 (fourteen) days. 08/22/20   [provider]  ?hydrOXYzine (ATARAX/VISTARIL) 25 MG tablet Take 25 mg by mouth every morning.    [provider]  ?Insulin Lispro Prot & Lispro (HUMALOG 75/25 MIX) (75-25) 100 UNIT/ML Kwikpen Inject 35 Units into the skin 2 (two) times daily with a meal. ?Patient taking differently: Inject 35 Units into the skin in the morning and at bedtime. 05/01/20   Renato Shin, MD  ?Methoxy PEG-Epoetin Beta (MIRCERA IJ) Dialysis Monday,Wednesday and friday 12/19/20 12/18/21  [provider]  ?metoprolol succinate (TOPROL-XL) 100 MG 24 hr tablet Take 100 mg by mouth daily. Take with or immediately following a meal.    [provider]  ?Multiple Vitamins-Minerals (ZINC PO) Take 1 tablet by mouth every morning.    [provider]  ?NARCAN 4 MG/0.1ML LIQD nasal spray kit Place 1 spray into the nose as needed (accidental overdose.).  10/10/19   [provider]  ?OVER THE COUNTER MEDICATION Take 1 capsule by mouth every morning.  Omega XL    [provider]  ?oxyCODONE (ROXICODONE) 5 MG immediate release tablet 1 tab PO q6 hours prn pain 01/15/21   Leanora Cover, MD  ?oxyCODONE-acetaminophen (PERCOCET) 10-325 MG tablet Take 1

## 2021-04-26 NOTE — ED Notes (Signed)
RT at bedside starting Bipap per EDP Zenia Resides order. ?

## 2021-04-26 NOTE — Progress Notes (Signed)
RT note: Patient completed dialysis and wished to be taken off BIPAP. Patient placed on 3L nasal canula. Vital signs stable at this time. Beside RN aware. ?

## 2021-04-27 ENCOUNTER — Other Ambulatory Visit (HOSPITAL_COMMUNITY): Payer: Medicare Other

## 2021-04-27 LAB — BASIC METABOLIC PANEL
Anion gap: 13 (ref 5–15)
BUN: 39 mg/dL — ABNORMAL HIGH (ref 6–20)
CO2: 28 mmol/L (ref 22–32)
Calcium: 8.2 mg/dL — ABNORMAL LOW (ref 8.9–10.3)
Chloride: 93 mmol/L — ABNORMAL LOW (ref 98–111)
Creatinine, Ser: 5.52 mg/dL — ABNORMAL HIGH (ref 0.44–1.00)
GFR, Estimated: 9 mL/min — ABNORMAL LOW (ref >60)
Glucose, Bld: 284 mg/dL — ABNORMAL HIGH (ref 70–99)
Potassium: 4.2 mmol/L (ref 3.5–5.1)
Sodium: 134 mmol/L — ABNORMAL LOW (ref 135–145)

## 2021-04-27 LAB — CBC
HCT: 26.8 % — ABNORMAL LOW (ref 36.0–46.0)
Hemoglobin: 8.8 g/dL — ABNORMAL LOW (ref 12.0–15.0)
MCH: 31.3 pg (ref 26.0–34.0)
MCHC: 32.8 g/dL (ref 30.0–36.0)
MCV: 95.4 fL (ref 80.0–100.0)
Platelets: 185 10*3/uL (ref 150–400)
RBC: 2.81 MIL/uL — ABNORMAL LOW (ref 3.87–5.11)
RDW: 14.4 % (ref 11.5–15.5)
WBC: 6.2 10*3/uL (ref 4.0–10.5)
nRBC: 0 % (ref 0.0–0.2)

## 2021-04-27 LAB — GLUCOSE, CAPILLARY
Glucose-Capillary: 258 mg/dL — ABNORMAL HIGH (ref 70–99)
Glucose-Capillary: 265 mg/dL — ABNORMAL HIGH (ref 70–99)

## 2021-04-27 MED ORDER — INSULIN DETEMIR 100 UNIT/ML ~~LOC~~ SOLN
20.0000 [IU] | Freq: Two times a day (BID) | SUBCUTANEOUS | Status: DC
  Filled 2021-04-27 (×2): qty 0.2

## 2021-04-27 MED ORDER — INSULIN ASPART 100 UNIT/ML IJ SOLN: 0.0000 [IU] | Freq: Three times a day (TID) | INTRAMUSCULAR | Status: DC

## 2021-04-27 MED ORDER — INSULIN ASPART 100 UNIT/ML IJ SOLN
0.0000 [IU] | Freq: Three times a day (TID) | INTRAMUSCULAR | Status: DC
  Administered 2021-04-27: 3 [IU] via SUBCUTANEOUS

## 2021-04-27 MED ORDER — ACETAMINOPHEN 500 MG PO TABS
500.0000 mg | ORAL_TABLET | Freq: Four times a day (QID) | ORAL | Status: DC | PRN
  Administered 2021-04-27 (×2): 500 mg via ORAL
  Filled 2021-04-27 (×2): qty 1

## 2021-04-27 NOTE — TOC Progression Note (Signed)
Transition of Care (TOC) - Progression Note  ? ? ?Patient Details  ?Name: [name] ?MRN: [id number] ?Date of Birth: 04-Oct-1965 ? ?Transition of Care (TOC) CM/SW Contact  ?Angelita Ingles, RN ?Phone Number:[phone] ? ?04/27/2021, 9:11 AM ? ?Clinical Narrative:    ?CM received message from nurse stating that patient will be discharging and needs a walker. CM has verified with [place] that patient is not able to get walker through insurance without PT recommendation with notes and script.  ? ? ?  ?  ? ?Expected Discharge Plan and Services ?  ?  ?  ?  ?  ?Expected Discharge Date: 04/27/21               ?  ?  ?  ?  ?  ?  ?  ?  ?  ?  ? ? ?Social Determinants of Health (SDOH) Interventions ?  ? ?Readmission Risk Interventions ? ?  02/28/2020  ?  1:08 PM  ?Readmission Risk Prevention Plan  ?Transportation Screening Complete  ?PCP or Specialist Appt within 5-7 Days Complete  ?Home Care Screening Complete  ?Medication Review (RN CM) Complete  ? ? ?

## 2021-04-27 NOTE — Progress Notes (Signed)
[name] to be D/C'd Home per MD order.  Discussed with the patient and all questions fully answered. ? ?VSS, Skin clean, dry and intact without evidence of skin break down, no evidence of skin tears noted. ?IV catheter discontinued intact. Site without signs and symptoms of complications. Dressing and pressure applied. ? ?An After Visit Summary was printed and given to the patient. Patient received prescription. ? ?D/c education completed with patient/family including follow up instructions, medication list, d/c activities limitations if indicated, with other d/c instructions as indicated by MD - patient able to verbalize understanding, all questions fully answered.  ?Central line discontinue prior to D/C today.  ? ?Patient instructed to return to ED, call 911, or call MD for any changes in condition.  ? ?Patient escorted via [place], and D/C home via private auto.  ? ?Hosie Spangle ?04/27/2021 9:50 AM  ?

## 2021-04-27 NOTE — Discharge Summary (Signed)
DISCHARGE SUMMARY ? ? ?Name: [name] ?MRN: [id number] ?DOB: [birth year] 56 y.o. ?PCP: Sandi Mariscal, MD ? ?Date of Admission: 04/26/2021 10:59 AM ?Date of Discharge: 04/27/2021 ?Attending Physician: Erskine Emery ? ?Discharge Diagnosis: ?Acute hypoxic respiratory failure requiring NIPPV ?Hypertensive urgency ?Metabolic encephalopathy ?Hyperkalemia ?Hypervolemia ?ESRD on HD ? ?Discharge Medications: ?Allergies as of 04/27/2021   ? ?   Reactions  ? Bupropion Itching  ? Dilaudid [hydromorphone] Other (See Comments)  ? Apnea, required intubation  ? ?  ? ?  ?Medication List  ?  ? ?TAKE these medications   ? ?acetaminophen 500 MG tablet ?Commonly known as: TYLENOL ?Take 500-1,000 mg by mouth daily as needed for headache (pain). ?  ?albuterol 108 (90 Base) MCG/ACT inhaler ?Commonly known as: VENTOLIN HFA ?Inhale 2 puffs into the lungs every 6 (six) hours as needed for wheezing or shortness of breath. ?  ?Auryxia 1 GM 210 MG(Fe) tablet ?Generic drug: ferric citrate ?Take 420-840 mg by mouth See admin instructions. Take  4 tablets (840 mg) by mouth twice daily with meals, occasionally take 2 tablets (460m) with a snack ?  ?b complex-vitamin c-folic acid 0.8 MG Tabs tablet ?Take 1 tablet by mouth every Monday, Wednesday, and Friday with hemodialysis. ?  ?Humira Pen 40 MG/0.4ML Pnkt ?Generic drug: Adalimumab ?Inject 40 mg into the skin every 14 (fourteen) days. ?  ?hydrOXYzine 25 MG tablet ?Commonly known as: ATARAX ?Take 25 mg by mouth every morning. ?  ?Insulin Lispro Prot & Lispro (75-25) 100 UNIT/ML Kwikpen ?Commonly known as: HUMALOG 75/25 MIX ?Inject 35 Units into the skin 2 (two) times daily with a meal. ?What changed: when to take this ?  ?metoprolol succinate 100 MG 24 hr tablet ?Commonly known as: TOPROL-XL ?Take 100 mg by mouth daily. Take with or immediately following a meal. ?  ?MIRCERA IJ ?Dialysis Monday,Wednesday and friday ?  ?Narcan 4 MG/0.1ML Liqd nasal spray kit ?Generic drug: naloxone ?Place 1 spray into the  nose as needed (accidental overdose.). ?  ?OneTouch Verio test strip ?Generic drug: glucose blood ?1 each by Other route 2 (two) times daily. And lancets 2/day ?  ?OVER THE COUNTER MEDICATION ?Take 1 capsule by mouth every morning. Omega XL ?  ?oxyCODONE-acetaminophen 10-325 MG tablet ?Commonly known as: PERCOCET ?Take 1 tablet by mouth 3 (three) times daily as needed for pain. ?  ?pramipexole 0.5 MG tablet ?Commonly known as: MIRAPEX ?Take 0.5 mg by mouth daily. ?  ?rosuvastatin 40 MG tablet ?Commonly known as: CRESTOR ?TAKE 1 TABLET BY MOUTH DAILY *PATIENT NEEDS APPOINTMENT* ?What changed: See the new instructions. ?  ?VITAMIN C PO ?Take 1 tablet by mouth See admin instructions. At dialysis Monday,Wednesday and friday ?  ?ZINC PO ?Take 1 tablet by mouth every morning. ?  ? ?  ? ? ?Disposition and follow-up:   ?[facility]  At the hospital follow up visit please address: ? ?ESRD on HD: encourage adherence to HD schedule ? ?Lung nodules with hx of right renal mass ?-Ensure completion of chest CT ?-Follow up with Dr. BLamonte Sakai?-Follow up with urology for renal mass ? ? ?Labs / imaging needed at time of follow-up: N/A ? ?Pending labs/ test needing follow-up: hepatitis B surface antibody ? ?Follow-up Appointments: ?Pulmonology with Dr. BLamonte Sakai5/4/23 ?Oncology 05/18/21 ? ?Hospital Course: ?Catherine OKIis a 56year old chronically ill female with ESRD on HD [facility] and  confusion in the setting of missed HD 2 days prior. Found to have hypertensive urgency and respiratory failure requiring BiPAP therapy. She was admitted to ICU. She underwent hemodialysis with resolution of symptoms. She was weaned to room air and blood pressure was stable by hospital day #1. She had outpatient dialysis scheduled at 10:45AM. She was discharged with instructions to undergo another HD session today, which she agreed  to. Education provided surrounding the importance of adherence to HD. ? ?Of additional note: She is currently following with Dr. Lamonte Sakai in pulmology for lung nodules. Super D chest CT is scheduled for 05/28/21 @ 10:40am.  ? ? ? ?Discharge Vitals:   ?BP (!) 115/55 (BP Location: Left Arm)   Pulse 84   Temp 98.9 ?F (37.2 ?C) (Oral)   Resp (!) 21   Ht 5' 8"  (1.727 m)   Wt 119.9 kg   LMP  (LMP Unknown) Comment: LMP Feb 2011  SpO2 97%   BMI 40.19 kg/m?  ? ?General: chronically ill appearing female sitting up in the recliner, recently having finished breakfast. ?Cardiac: RRR ?Pulm: breathing comfortably on room air. Saturations 100%. Lung sounds diminished throughout ?GI: soft, non-tender ?Skin: warm and dry ?Neuro: a/ox4. Converses normally. No focal neurologic deficit.  ? ?Pertinent Labs, Studies, and Procedures:  ? ? ?  Latest Ref Rng & Units 04/27/2021  ?  4:04 AM 04/26/2021  ? 11:38 AM 03/27/2021  ? 11:13 AM  ?CBC  ?WBC 4.0 - 10.5 K/uL 6.2   7.9   7.2    ?Hemoglobin 12.0 - 15.0 g/dL 8.8   7.5   9.8    ?Hematocrit 36.0 - 46.0 % 26.8   23.9   30.9    ?Platelets 150 - 400 K/uL 185   189   228    ? ? ?  Latest Ref Rng & Units 04/27/2021  ?  4:04 AM 04/26/2021  ? 11:38 AM 03/27/2021  ? 11:13 AM  ?BMP  ?Glucose 70 - 99 mg/dL 284   262   148    ?BUN 6 - 20 mg/dL 39   74   14    ?Creatinine 0.44 - 1.00 mg/dL 5.52   8.92   2.96    ?Sodium 135 - 145 mmol/L 134   133   138    ?Potassium 3.5 - 5.1 mmol/L 4.2   5.9   3.4    ?Chloride 98 - 111 mmol/L 93   96   95    ?CO2 22 - 32 mmol/L 28   22   32    ?Calcium 8.9 - 10.3 mg/dL 8.2   8.2   9.5    ? ? ?  Latest Ref Rng & Units 03/27/2021  ? 11:13 AM 01/12/2021  ?  1:00 AM 01/11/2021  ? 12:42 AM  ?Hepatic Function  ?Total Protein 6.5 - 8.1 g/dL 8.8      ?Albumin 3.5 - 5.0 g/dL 4.2   3.2   3.0    ?AST 15 - 41 U/L 13      ?ALT 0 - 44 U/L 11      ?Alk Phosphatase 38 - 126 U/L 195      ?Total Bilirubin 0.3 - 1.2 mg/dL 0.5      ? ?HBV sAb reactive ?HBV sAg negative ?HIV unreactive ?COVID  19, influenza NEG ?Ferritin 1408 ? ?DG Chest Port 1 View ? ?Result Date: 04/26/2021 ?CLINICAL DATA:  Short of breath, missed dialysis EXAM: PORTABLE CHEST 1 VIEW COMPARISON:  04/26/2021 FINDINGS: Single frontal view of the chest demonstrates right internal jugular catheter tip overlying the superior vena cava. Cardiac silhouette is enlarged. There is persistent vascular congestion with bilateral ground-glass airspace disease consistent with fluid overload. The known bilateral pulmonary nodule seen on prior PET scan are obscured by the ground-glass airspace disease. Stable left pleural effusion. No pneumothorax. IMPRESSION: 1. Stable pulmonary edema and left pleural effusion. Electronically Signed   By: Randa Ngo M.D.   On: 04/26/2021 18:01  ? ?DG Chest Port 1 View ? ?Result Date: 04/26/2021 ?CLINICAL DATA:  Shortness of breath EXAM: PORTABLE CHEST 1 VIEW COMPARISON:  Chest radiograph dated 11/25/2020 FINDINGS: The heart is enlarged. Moderate diffuse bilateral interstitial opacities likely represent pulmonary edema. There is a small left pleural effusion with associated atelectasis. A few areas of patchy opacities in both lungs likely reflect previously documented pulmonary nodules from exam dated 04/14/2021. Degenerative changes are seen in the spine. IMPRESSION: 1. Moderate diffuse bilateral interstitial opacities likely represent pulmonary edema. 2. Small left pleural effusion with associated atelectasis. 3. Opacities in both lungs likely reflect previously documented pulmonary nodules. Electronically Signed   By: Zerita Boers M.D.   On: 04/26/2021 12:09    ? ?Discharge Instructions: ?Discharge Instructions   ? ? Call MD for:  difficulty breathing, headache or visual disturbances   Complete by: As directed ?  ? Call MD for:  persistant dizziness or light-headedness   Complete by: As directed ?  ? Call MD for:  persistant nausea and vomiting   Complete by: As directed ?  ? Call MD for:  severe uncontrolled pain    Complete by: As directed ?  ? Call MD for:  temperature >100.4   Complete by: As directed ?  ? Increase activity slowly   Complete by: As directed ?  ? No wound care   Complete by: As directed ?  ? ?  ? ? ?

## 2021-04-27 NOTE — Progress Notes (Signed)
eLink Physician-Brief Progress Note ?Patient Name: [name] ?DOB: [birth year] ?MRN: [id number] ? ? ?Date of Service ? 04/27/2021  ?HPI/Events of Note ? Hand pain - Felt to be d/t severe RA. Patient request for home Tylenol. AST and ALT are both normal.   ?eICU Interventions ? Plan: ?Tylenol 500 mg PO Q 6 hours PRN pain.   ? ? ? ?Intervention Category ?Major Interventions: Other: ? ?[name] ?04/27/2021, 12:07 AM ?

## 2021-04-27 NOTE — Progress Notes (Signed)
Contacted by nephrologist and attending regarding pt's likely d/c this am. Plan is for pt to d/c and receive out-pt HD at [facility]). Navigator contacted [facility] and spoke to [place] who confirms pt is on the schedule for today and pt has 10:45 appt. Renee advised of plan for pt to d/c and receive treatment as out-pt. Contacted renal NP regarding clinic's need for orders for treatment. Will assist as needed.  ? ?[name] ?Renal Navigator ?[phone] ?

## 2021-04-28 ENCOUNTER — Telehealth (HOSPITAL_COMMUNITY): Payer: Self-pay | Admitting: Nephrology

## 2021-04-28 LAB — HEPATITIS B SURFACE ANTIBODY, QUANTITATIVE: Hep B S AB Quant (Post): 21.7 m[IU]/mL (ref >9.9)

## 2021-04-28 NOTE — Telephone Encounter (Signed)
Transition of care contact from inpatient facility ? ?Date of Discharge: 04/27/21 ?Date of Contact: 04/28/21 - attempted ?Method of contact: Phone ? ?Attempted to contact patient to discuss transition of care from inpatient admission. Patient did not answer the phone. Unable to leave message - voice mailbox full. Will try to reach her at outpatient dialysis this week. ? ?Veneta Penton, PA-C ?[facility] ?Pager [phone] ? ?

## 2021-05-01 ENCOUNTER — Ambulatory Visit: Payer: Medicare Other | Admitting: Physical Therapy

## 2021-05-01 ENCOUNTER — Telehealth: Payer: Self-pay | Admitting: Physical Therapy

## 2021-05-01 NOTE — Telephone Encounter (Signed)
Attempted to contact patient regarding second no-show to appointments. Voicemail full and unable to leave voicemail. Will cancel future appointments at this time per attendance policy.  ?

## 2021-05-15 ENCOUNTER — Ambulatory Visit: Payer: Medicare Other | Admitting: Physical Therapy

## 2021-05-18 ENCOUNTER — Inpatient Hospital Stay: Payer: Medicare Other | Attending: Hematology | Admitting: Hematology

## 2021-05-18 DIAGNOSIS — Z79899 Other long term (current) drug therapy: Secondary | ICD-10-CM | POA: Diagnosis not present

## 2021-05-18 DIAGNOSIS — R918 Other nonspecific abnormal finding of lung field: Secondary | ICD-10-CM | POA: Diagnosis present

## 2021-05-18 DIAGNOSIS — F17210 Nicotine dependence, cigarettes, uncomplicated: Secondary | ICD-10-CM | POA: Insufficient documentation

## 2021-05-18 DIAGNOSIS — M069 Rheumatoid arthritis, unspecified: Secondary | ICD-10-CM | POA: Insufficient documentation

## 2021-05-18 DIAGNOSIS — N289 Disorder of kidney and ureter, unspecified: Secondary | ICD-10-CM | POA: Diagnosis not present

## 2021-05-18 DIAGNOSIS — N2889 Other specified disorders of kidney and ureter: Secondary | ICD-10-CM | POA: Insufficient documentation

## 2021-05-20 NOTE — Addendum Note (Signed)
Addended by: Sullivan Lone on: 05/20/2021 11:58 PM ? ? Modules accepted: Level of Service ? ?

## 2021-05-20 NOTE — Progress Notes (Addendum)
? ? ?HEMATOLOGY/ONCOLOGY PHONE VISIT NOTE ? ?Date of Service: 05/18/2021 ? ? ?Patient Care Team: ?Sandi Mariscal, MD as PCP - General (Internal Medicine) ?Minus Breeding, MD as PCP - Cardiology (Cardiology) ?Leandrew Koyanagi, MD as Attending Physician (Orthopedic Surgery) ?Emilie Rutter, [facility] ? ?CHIEF COMPLAINTS/PURPOSE OF CONSULTATION:  ?Follow-up for pulmonary nodules of undetermined etiology concerning for possible metastatic disease. ? ?HISTORY OF PRESENTING ILLNESS:  ? ?Please see previous notes for details on initial presentation . ? ?INTERVAL HISTORY ? ?.I connected with [name] on .05/18/2021 at  3:00 PM EDT by telephone visit and verified that I am speaking with the correct person using two identifiers.  ? ?I discussed the limitations, risks, security and privacy concerns of performing an evaluation and management service by telemedicine and the availability of in-person appointments. I also discussed with the patient that there may be a patient responsible charge related to this service. The patient expressed understanding and agreed to proceed.  ? ?Other persons participating in the visit and their role in the encounter: none  ? ?Patient?s location: home   ?Provider?s location: [facility]  ? ?Chief Complaint: f/u for renal lesion and pulmonary mets ? ?Patient was called to follow-up with her.  She notes she did see Dr. Tresa Moore in urology and the imaging study showing the renal lesion was considered stable and have not changed in size significantly and a conservative approach was discussed and recommended by urology at this time. ?Patient notes some mild dizziness with rapid change of body position however no dizziness at this time. ? ?Patient has a follow-up CT chest high-resolution scheduled for 05/28/2021 by Dr. Lamonte Sakai ?MEDICAL HISTORY:  ?Past Medical History:  ?Diagnosis Date  ? Anaphylactic shock, unspecified, initial encounter 09/04/2018  ? Anemia   ? Anxiety   ? CAD  (coronary artery disease)   ? Nonobstructive on CT 2019  ? CHF (congestive heart failure) ([place])   ? Chronic bilateral pleural effusions 10/23/2018  ? COVID-19 10/2018  ? COVID-19 virus detected 10/10/2018  ? COVID-19 virus infection 09/18/2018  ? ESRD (end stage renal disease) ([place])   ? Dialysis [facility]- 3[address]  ? Gangrene ([place]) 09/18/2018  ? Headache(784.0)   ? High cholesterol   ? History of blood transfusion   ? Hypertension   ? Mitral regurgitation   ? moderate to severe MR 10/2018 echo  ? Nerve pain   ? "they say I have L5 nerve damage; my lumbar"  ? Neuropathy 01/29/2011  ? 04/2011 MRI L-spine:  L5-S1: Bulge/shallow broad-based protrusion greatest centrally and in the right posterior lateral position. Minimal crowding of the  upper aspect of the S1 nerve root greater on the right. Left lateral disc osteophyte with mild encroachment upon but not  significant compression of the exiting left L5 nerve root.   05/2011: Dr. Sherwood Gambler ([facility]): DJD and mild disc bu  ? Pericardial effusion   ? Pneumonia   ? ; 11/16/2018- "touch of pneumonia" - seen in ED- 11/14/2018- on antibiotic. Has had it x 2  ? Pneumonia 10/10/2018  ? PVD (peripheral vascular disease) ([place])   ? Right leg stent in [place].  (No records)  ? Restless legs   ? Rheumatoid arthritis ([place])   ? "knees" "hands", "RA"  ? Sleep apnea   ? does not use Cpap  ? Thoracic ascending aortic aneurysm   ? 4.4 cm 11/14/18 CTA  ? Type II diabetes mellitus ([place])   ? Uterine leiomyoma 01/10/2021  ? ? ?  SURGICAL HISTORY: ?Past Surgical History:  ?Procedure Laterality Date  ? AMPUTATION Left 12/27/2018  ? Procedure: LEFT THUMB REVISION AMPUTATION DIGIT;  Surgeon: Charlotte Crumb, MD;  Location: [place];  Service: Orthopedics;  Laterality: Left;  ? AMPUTATION Left 01/15/2021  ? Procedure: AMPUTATION OF DIGIT LEFT INDEX FINGER;  Surgeon: Leanora Cover, MD;  Location: [place];  Service: Orthopedics;  Laterality: Left;  ? AV FISTULA PLACEMENT Left   ? AV FISTULA  PLACEMENT Right 03/12/2019  ? Procedure: INSERTION OF ARTERIOVENOUS (AV) GORE-TEX GRAFT ARM;  Surgeon: Elam Dutch, MD;  Location: [facility];  Service: Vascular;  Laterality: Right;  ? AV FISTULA PLACEMENT Right 08/13/2019  ? Procedure: RIGHT UPPER ARM ARTERIOVENOUS (AV) FISTULA CREATION WITH BASILIC VEIN;  Surgeon: Elam Dutch, MD;  Location: [place];  Service: Vascular;  Laterality: Right;  ? [place]; 1997  ? x 2  ? COLONOSCOPY    ? EYE SURGERY Bilateral   ? cataract surgery  ? I & D EXTREMITY Left 01/09/2021  ? Procedure: IRRIGATION AND DEBRIDEMENT INDEX FINGER;  Surgeon: Leanora Cover, MD;  Location: [place];  Service: Orthopedics;  Laterality: Left;  ? INSERTION OF DIALYSIS CATHETER Right 09/26/2018  ? Procedure: INSERTION OF DIALYSIS CATHETER Right Internal Jugular.;  Surgeon: Elam Dutch, MD;  Location: [facility] OR;  Service: Vascular;  Laterality: Right;  ? IR DIALY SHUNT INTRO NEEDLE/INTRACATH INITIAL W/IMG RIGHT Right 11/21/2020  ? IR FLUORO GUIDE CV LINE RIGHT  05/04/2019  ? IR US GUIDE VASC ACCESS RIGHT  05/04/2019  ? IR US GUIDE VASC ACCESS RIGHT  11/21/2020  ? LIGATION OF ARTERIOVENOUS  FISTULA Left 09/26/2018  ? Procedure: LIGATION OF ARTERIOVENOUS  FISTULA LEFT ARM;  Surgeon: Elam Dutch, MD;  Location: [place];  Service: Vascular;  Laterality: Left;  ? PERICARDIAL WINDOW  02/2004  ? for pericardial effusion  ? PERIPHERAL VASCULAR INTERVENTION Right 10/26/2019  ? Procedure: PERIPHERAL VASCULAR INTERVENTION;  Surgeon: Elam Dutch, MD;  Location: [place] [facility];  Service: Cardiovascular;  Laterality: Right;  arm  AV fistula  ? TUBAL LIGATION  05/1995  ? UPPER EXTREMITY VENOGRAPHY N/A 06/22/2019  ? Procedure: UPPER EXTREMITY VENOGRAPHY - Right [place];  Surgeon: Elam Dutch, MD;  Location: [place] [facility];  Service: Cardiovascular;  Laterality: N/A;  ? ? ?SOCIAL HISTORY: ?Social History  ? ?Socioeconomic History  ? Marital status: Married  ?  Spouse name: Not on file  ? Number  of children: Not on file  ? Years of education: Not on file  ? Highest education level: Not on file  ?Occupational History  ? Not on file  ?Tobacco Use  ? Smoking status: Some Days  ?  Packs/day: 0.25  ?  Years: 33.00  ?  Pack years: 8.25  ?  Types: Cigarettes  ?  Last attempt to quit: 07/17/2019  ?  Years since quitting: 1.8  ? Smokeless tobacco: Never  ? Tobacco comments:  ?  She started back 2021 and started back 6 month later.  ?Vaping Use  ? Vaping Use: Never used  ?Substance and Sexual Activity  ? Alcohol use: No  ? Drug use: No  ? Sexual activity: Not Currently  ?  Birth control/protection: Post-menopausal  ?Other Topics Concern  ? Not on file  ?Social History Narrative  ? Lives with son.    ?   ? ?Social Determinants of Health  ? ?Financial Resource Strain: Not on file  ?Food Insecurity: Not on  file  ?Transportation Needs: Not on file  ?Physical Activity: Not on file  ?Stress: Not on file  ?Social Connections: Not on file  ?Intimate Partner Violence: Not on file  ? ? ?FAMILY HISTORY: ?Family History  ?Problem Relation Age of Onset  ? Cancer Brother   ? Heart disease Father   ?     Died age 83  ? Diabetes Father   ? Hyperlipidemia Father   ? Hypertension Father   ? Stroke Father   ? Diabetes Mother   ? Hypertension Mother   ? Stroke Mother   ? Dementia Mother   ? Diabetes Sister   ? Diabetes Brother   ? Hypertension Sister   ? Hypertension Brother   ? ? ?ALLERGIES:  is allergic to bupropion and dilaudid [hydromorphone]. ? ?MEDICATIONS:  ?Current Outpatient Medications  ?Medication Sig Dispense Refill  ? acetaminophen (TYLENOL) 500 MG tablet Take 500-1,000 mg by mouth daily as needed for headache (pain).    ? albuterol (VENTOLIN HFA) 108 (90 Base) MCG/ACT inhaler Inhale 2 puffs into the lungs every 6 (six) hours as needed for wheezing or shortness of breath. 6.7 g 0  ? Ascorbic Acid (VITAMIN C PO) Take 1 tablet by mouth See admin instructions. At dialysis Monday,Wednesday and friday    ? AURYXIA 1 GM 210 MG(Fe)  tablet Take 420-840 mg by mouth See admin instructions. Take  4 tablets (840 mg) by mouth twice daily with meals, occasionally take 2 tablets ('420mg'$ ) with a snack    ? b complex-vitamin c-folic acid (NEPHRO-VITE)

## 2021-05-25 ENCOUNTER — Encounter (HOSPITAL_COMMUNITY): Payer: Self-pay

## 2021-05-25 ENCOUNTER — Inpatient Hospital Stay (HOSPITAL_COMMUNITY)
Admission: EM | Admit: 2021-05-25 | Discharge: 2021-06-11 | DRG: 616 | Disposition: A | Discharge status: Skilled Nursing Facility | Payer: Medicare Other | Attending: Internal Medicine | Admitting: Internal Medicine

## 2021-05-25 ENCOUNTER — Other Ambulatory Visit: Payer: Self-pay

## 2021-05-25 ENCOUNTER — Emergency Department (HOSPITAL_COMMUNITY): Payer: Medicare Other

## 2021-05-25 ENCOUNTER — Encounter (HOSPITAL_COMMUNITY): Payer: Self-pay | Admitting: Emergency Medicine

## 2021-05-25 ENCOUNTER — Ambulatory Visit (HOSPITAL_COMMUNITY)
Admission: EM | Admit: 2021-05-25 | Discharge: 2021-05-25 | Disposition: A | Discharge status: Home / Self Care | Payer: Medicare Other

## 2021-05-25 DIAGNOSIS — M25562 Pain in left knee: Secondary | ICD-10-CM | POA: Diagnosis not present

## 2021-05-25 DIAGNOSIS — Z6841 Body Mass Index (BMI) 40.0 and over, adult: Secondary | ICD-10-CM

## 2021-05-25 DIAGNOSIS — E662 Morbid (severe) obesity with alveolar hypoventilation: Secondary | ICD-10-CM | POA: Diagnosis present

## 2021-05-25 DIAGNOSIS — E1152 Type 2 diabetes mellitus with diabetic peripheral angiopathy with gangrene: Secondary | ICD-10-CM | POA: Diagnosis present

## 2021-05-25 DIAGNOSIS — D62 Acute posthemorrhagic anemia: Secondary | ICD-10-CM | POA: Diagnosis not present

## 2021-05-25 DIAGNOSIS — R7881 Bacteremia: Secondary | ICD-10-CM | POA: Diagnosis present

## 2021-05-25 DIAGNOSIS — R278 Other lack of coordination: Secondary | ICD-10-CM | POA: Diagnosis present

## 2021-05-25 DIAGNOSIS — Z89022 Acquired absence of left finger(s): Secondary | ICD-10-CM

## 2021-05-25 DIAGNOSIS — E669 Obesity, unspecified: Secondary | ICD-10-CM

## 2021-05-25 DIAGNOSIS — L97509 Non-pressure chronic ulcer of other part of unspecified foot with unspecified severity: Secondary | ICD-10-CM | POA: Diagnosis present

## 2021-05-25 DIAGNOSIS — E8770 Fluid overload, unspecified: Secondary | ICD-10-CM | POA: Diagnosis present

## 2021-05-25 DIAGNOSIS — N2581 Secondary hyperparathyroidism of renal origin: Secondary | ICD-10-CM | POA: Diagnosis present

## 2021-05-25 DIAGNOSIS — Z83438 Family history of other disorder of lipoprotein metabolism and other lipidemia: Secondary | ICD-10-CM

## 2021-05-25 DIAGNOSIS — I2510 Atherosclerotic heart disease of native coronary artery without angina pectoris: Secondary | ICD-10-CM | POA: Diagnosis not present

## 2021-05-25 DIAGNOSIS — E1159 Type 2 diabetes mellitus with other circulatory complications: Secondary | ICD-10-CM

## 2021-05-25 DIAGNOSIS — E1165 Type 2 diabetes mellitus with hyperglycemia: Secondary | ICD-10-CM | POA: Diagnosis present

## 2021-05-25 DIAGNOSIS — F419 Anxiety disorder, unspecified: Secondary | ICD-10-CM | POA: Diagnosis present

## 2021-05-25 DIAGNOSIS — Z9981 Dependence on supplemental oxygen: Secondary | ICD-10-CM

## 2021-05-25 DIAGNOSIS — Z8249 Family history of ischemic heart disease and other diseases of the circulatory system: Secondary | ICD-10-CM

## 2021-05-25 DIAGNOSIS — L299 Pruritus, unspecified: Secondary | ICD-10-CM | POA: Diagnosis not present

## 2021-05-25 DIAGNOSIS — Z888 Allergy status to other drugs, medicaments and biological substances status: Secondary | ICD-10-CM

## 2021-05-25 DIAGNOSIS — I4891 Unspecified atrial fibrillation: Secondary | ICD-10-CM | POA: Diagnosis not present

## 2021-05-25 DIAGNOSIS — E871 Hypo-osmolality and hyponatremia: Secondary | ICD-10-CM | POA: Diagnosis not present

## 2021-05-25 DIAGNOSIS — E1122 Type 2 diabetes mellitus with diabetic chronic kidney disease: Secondary | ICD-10-CM | POA: Diagnosis present

## 2021-05-25 DIAGNOSIS — E785 Hyperlipidemia, unspecified: Secondary | ICD-10-CM

## 2021-05-25 DIAGNOSIS — I953 Hypotension of hemodialysis: Secondary | ICD-10-CM | POA: Diagnosis not present

## 2021-05-25 DIAGNOSIS — Z87892 Personal history of anaphylaxis: Secondary | ICD-10-CM

## 2021-05-25 DIAGNOSIS — Z823 Family history of stroke: Secondary | ICD-10-CM

## 2021-05-25 DIAGNOSIS — G894 Chronic pain syndrome: Secondary | ICD-10-CM

## 2021-05-25 DIAGNOSIS — N186 End stage renal disease: Secondary | ICD-10-CM | POA: Diagnosis not present

## 2021-05-25 DIAGNOSIS — I70262 Atherosclerosis of native arteries of extremities with gangrene, left leg: Secondary | ICD-10-CM | POA: Diagnosis present

## 2021-05-25 DIAGNOSIS — M869 Osteomyelitis, unspecified: Secondary | ICD-10-CM | POA: Diagnosis not present

## 2021-05-25 DIAGNOSIS — I5189 Other ill-defined heart diseases: Secondary | ICD-10-CM

## 2021-05-25 DIAGNOSIS — R0602 Shortness of breath: Secondary | ICD-10-CM

## 2021-05-25 DIAGNOSIS — E7800 Pure hypercholesterolemia, unspecified: Secondary | ICD-10-CM | POA: Diagnosis present

## 2021-05-25 DIAGNOSIS — I5032 Chronic diastolic (congestive) heart failure: Secondary | ICD-10-CM | POA: Diagnosis present

## 2021-05-25 DIAGNOSIS — Z79899 Other long term (current) drug therapy: Secondary | ICD-10-CM

## 2021-05-25 DIAGNOSIS — Z885 Allergy status to narcotic agent status: Secondary | ICD-10-CM

## 2021-05-25 DIAGNOSIS — I152 Hypertension secondary to endocrine disorders: Secondary | ICD-10-CM

## 2021-05-25 DIAGNOSIS — Y92239 Unspecified place in hospital as the place of occurrence of the external cause: Secondary | ICD-10-CM | POA: Diagnosis not present

## 2021-05-25 DIAGNOSIS — Z833 Family history of diabetes mellitus: Secondary | ICD-10-CM

## 2021-05-25 DIAGNOSIS — F17210 Nicotine dependence, cigarettes, uncomplicated: Secondary | ICD-10-CM | POA: Diagnosis present

## 2021-05-25 DIAGNOSIS — Z89012 Acquired absence of left thumb: Secondary | ICD-10-CM

## 2021-05-25 DIAGNOSIS — Z992 Dependence on renal dialysis: Secondary | ICD-10-CM

## 2021-05-25 DIAGNOSIS — J9611 Chronic respiratory failure with hypoxia: Secondary | ICD-10-CM | POA: Diagnosis present

## 2021-05-25 DIAGNOSIS — I132 Hypertensive heart and chronic kidney disease with heart failure and with stage 5 chronic kidney disease, or end stage renal disease: Secondary | ICD-10-CM | POA: Diagnosis present

## 2021-05-25 DIAGNOSIS — N189 Chronic kidney disease, unspecified: Secondary | ICD-10-CM | POA: Diagnosis present

## 2021-05-25 DIAGNOSIS — E1169 Type 2 diabetes mellitus with other specified complication: Principal | ICD-10-CM

## 2021-05-25 DIAGNOSIS — A498 Other bacterial infections of unspecified site: Secondary | ICD-10-CM

## 2021-05-25 DIAGNOSIS — G253 Myoclonus: Secondary | ICD-10-CM | POA: Diagnosis not present

## 2021-05-25 DIAGNOSIS — G9341 Metabolic encephalopathy: Secondary | ICD-10-CM | POA: Diagnosis not present

## 2021-05-25 DIAGNOSIS — Z8616 Personal history of COVID-19: Secondary | ICD-10-CM

## 2021-05-25 DIAGNOSIS — E11621 Type 2 diabetes mellitus with foot ulcer: Secondary | ICD-10-CM | POA: Diagnosis present

## 2021-05-25 DIAGNOSIS — A419 Sepsis, unspecified organism: Secondary | ICD-10-CM | POA: Diagnosis present

## 2021-05-25 DIAGNOSIS — T40605A Adverse effect of unspecified narcotics, initial encounter: Secondary | ICD-10-CM | POA: Diagnosis not present

## 2021-05-25 DIAGNOSIS — I96 Gangrene, not elsewhere classified: Secondary | ICD-10-CM

## 2021-05-25 DIAGNOSIS — I7121 Aneurysm of the ascending aorta, without rupture: Secondary | ICD-10-CM | POA: Diagnosis present

## 2021-05-25 DIAGNOSIS — I872 Venous insufficiency (chronic) (peripheral): Secondary | ICD-10-CM | POA: Diagnosis present

## 2021-05-25 DIAGNOSIS — Z993 Dependence on wheelchair: Secondary | ICD-10-CM

## 2021-05-25 DIAGNOSIS — G2581 Restless legs syndrome: Secondary | ICD-10-CM | POA: Diagnosis present

## 2021-05-25 DIAGNOSIS — Z794 Long term (current) use of insulin: Secondary | ICD-10-CM

## 2021-05-25 DIAGNOSIS — E875 Hyperkalemia: Secondary | ICD-10-CM | POA: Diagnosis not present

## 2021-05-25 DIAGNOSIS — D631 Anemia in chronic kidney disease: Secondary | ICD-10-CM | POA: Diagnosis present

## 2021-05-25 DIAGNOSIS — L97529 Non-pressure chronic ulcer of other part of left foot with unspecified severity: Secondary | ICD-10-CM | POA: Diagnosis present

## 2021-05-25 DIAGNOSIS — E1140 Type 2 diabetes mellitus with diabetic neuropathy, unspecified: Secondary | ICD-10-CM | POA: Diagnosis present

## 2021-05-25 DIAGNOSIS — I959 Hypotension, unspecified: Secondary | ICD-10-CM

## 2021-05-25 DIAGNOSIS — M069 Rheumatoid arthritis, unspecified: Secondary | ICD-10-CM | POA: Diagnosis present

## 2021-05-25 LAB — CBC
HCT: 23.1 % — ABNORMAL LOW (ref 36.0–46.0)
Hemoglobin: 7.2 g/dL — ABNORMAL LOW (ref 12.0–15.0)
MCH: 30.6 pg (ref 26.0–34.0)
MCHC: 31.2 g/dL (ref 30.0–36.0)
MCV: 98.3 fL (ref 80.0–100.0)
Platelets: 252 10*3/uL (ref 150–400)
RBC: 2.35 MIL/uL — ABNORMAL LOW (ref 3.87–5.11)
RDW: 14 % (ref 11.5–15.5)
WBC: 11.8 10*3/uL — ABNORMAL HIGH (ref 4.0–10.5)
nRBC: 0 % (ref 0.0–0.2)

## 2021-05-25 LAB — COMPREHENSIVE METABOLIC PANEL
ALT: 21 U/L (ref 0–44)
AST: 22 U/L (ref 15–41)
Albumin: 2.5 g/dL — ABNORMAL LOW (ref 3.5–5.0)
Alkaline Phosphatase: 145 U/L — ABNORMAL HIGH (ref 38–126)
Anion gap: 12 (ref 5–15)
BUN: 27 mg/dL — ABNORMAL HIGH (ref 6–20)
CO2: 30 mmol/L (ref 22–32)
Calcium: 8.3 mg/dL — ABNORMAL LOW (ref 8.9–10.3)
Chloride: 87 mmol/L — ABNORMAL LOW (ref 98–111)
Creatinine, Ser: 4.67 mg/dL — ABNORMAL HIGH (ref 0.44–1.00)
GFR, Estimated: 10 mL/min — ABNORMAL LOW (ref >60)
Glucose, Bld: 400 mg/dL — ABNORMAL HIGH (ref 70–99)
Potassium: 4.3 mmol/L (ref 3.5–5.1)
Sodium: 129 mmol/L — ABNORMAL LOW (ref 135–145)
Total Bilirubin: 0.7 mg/dL (ref 0.3–1.2)
Total Protein: 7.7 g/dL (ref 6.5–8.1)

## 2021-05-25 LAB — CBG MONITORING, ED: Glucose-Capillary: 391 mg/dL — ABNORMAL HIGH (ref 70–99)

## 2021-05-25 LAB — LACTIC ACID, PLASMA: Lactic Acid, Venous: 2.4 mmol/L (ref 0.5–1.9)

## 2021-05-25 MED ORDER — SODIUM CHLORIDE 0.9 % IV SOLN
1.0000 g | INTRAVENOUS | Status: DC
  Administered 2021-05-26 – 2021-06-07 (×10): 1 g via INTRAVENOUS
  Filled 2021-05-25 (×14): qty 10

## 2021-05-25 MED ORDER — ACETAMINOPHEN 325 MG PO TABS
650.0000 mg | ORAL_TABLET | Freq: Four times a day (QID) | ORAL | Status: DC | PRN
  Administered 2021-06-06 – 2021-06-10 (×3): 650 mg via ORAL
  Filled 2021-05-25 (×8): qty 2

## 2021-05-25 MED ORDER — ROSUVASTATIN CALCIUM 20 MG PO TABS
40.0000 mg | ORAL_TABLET | Freq: Every day | ORAL | Status: DC
Start: 2021-05-26 — End: 2021-06-11
  Administered 2021-05-26 – 2021-06-11 (×14): 40 mg via ORAL
  Filled 2021-05-25 (×15): qty 2

## 2021-05-25 MED ORDER — MORPHINE SULFATE (PF) 4 MG/ML IV SOLN
4.0000 mg | Freq: Once | INTRAVENOUS | Status: AC
  Administered 2021-05-25: 4 mg via INTRAVENOUS
  Filled 2021-05-25: qty 1

## 2021-05-25 MED ORDER — SODIUM CHLORIDE 0.9% FLUSH
3.0000 mL | Freq: Two times a day (BID) | INTRAVENOUS | Status: DC
  Administered 2021-05-29 – 2021-06-10 (×10): 3 mL via INTRAVENOUS

## 2021-05-25 MED ORDER — METOPROLOL SUCCINATE ER 100 MG PO TB24
100.0000 mg | ORAL_TABLET | Freq: Every day | ORAL | Status: DC
  Filled 2021-05-25: qty 4
  Filled 2021-05-25: qty 1

## 2021-05-25 MED ORDER — INSULIN ASPART 100 UNIT/ML IJ SOLN: 0.0000 [IU] | Freq: Three times a day (TID) | INTRAMUSCULAR | Status: DC

## 2021-05-25 MED ORDER — INSULIN ASPART PROT & ASPART (70-30 MIX) 100 UNIT/ML ~~LOC~~ SUSP
20.0000 [IU] | Freq: Two times a day (BID) | SUBCUTANEOUS | Status: DC
  Filled 2021-05-25: qty 10

## 2021-05-25 MED ORDER — HYDROCODONE-ACETAMINOPHEN 7.5-325 MG PO TABS: 1.0000 | ORAL_TABLET | Freq: Four times a day (QID) | ORAL | Status: DC | PRN

## 2021-05-25 MED ORDER — SODIUM CHLORIDE 0.9 % IV SOLN
2.0000 g | Freq: Once | INTRAVENOUS | Status: AC
  Administered 2021-05-25: 2 g via INTRAVENOUS
  Filled 2021-05-25: qty 12.5

## 2021-05-25 MED ORDER — VANCOMYCIN HCL 2000 MG/400ML IV SOLN
2000.0000 mg | Freq: Once | INTRAVENOUS | Status: AC
Start: 2021-05-25 — End: 2021-05-26
  Administered 2021-05-25: 2000 mg via INTRAVENOUS
  Filled 2021-05-25: qty 400

## 2021-05-25 MED ORDER — ACETAMINOPHEN 650 MG RE SUPP
650.0000 mg | Freq: Four times a day (QID) | RECTAL | Status: DC | PRN
  Administered 2021-06-04: 650 mg via RECTAL
  Filled 2021-05-25: qty 1

## 2021-05-25 MED ORDER — POLYETHYLENE GLYCOL 3350 17 G PO PACK
17.0000 g | PACK | Freq: Every day | ORAL | Status: DC | PRN
  Administered 2021-06-07: 17 g via ORAL
  Filled 2021-05-25: qty 1

## 2021-05-25 MED ORDER — HYDROCODONE-ACETAMINOPHEN 5-325 MG PO TABS
1.5000 | ORAL_TABLET | Freq: Four times a day (QID) | ORAL | Status: DC | PRN
  Administered 2021-05-26 – 2021-06-03 (×6): 1.5 via ORAL
  Filled 2021-05-25 (×6): qty 2

## 2021-05-25 MED ORDER — ACETAMINOPHEN 325 MG PO TABS
650.0000 mg | ORAL_TABLET | Freq: Once | ORAL | Status: AC
  Administered 2021-05-25: 650 mg via ORAL
  Filled 2021-05-25: qty 2

## 2021-05-25 NOTE — ED Provider Notes (Signed)
?[place] ?Provider Note ? ? ?CSN: [id number] ?Arrival date & time: 05/25/21  2001 ? ?  ? ?History ? ?Chief Complaint  ?Patient presents with  ? Wound Check  ? ? ?[name] is a 56 y.o. female. ? ?HPI ? ?Patient is a 56 year old female with a history of hypertension, end-stage renal disease on hemodialysis who presents to the emergency department for left foot swelling and tenderness with yellow discharge.  Patient reported her symptoms started 3 days ago.  She went to see an urgent care and was transferred here by EMS.  She reports that she has been getting her hemodialysis and has not missed any session.  In addition, she reports she is supposed to get another hemodialysis tomorrow given her chronic symptoms of shortness of breath.  She currently denies any chest pain.  She denies any recent history of fever.  Denies abdominal pain, nausea, vomiting or diarrhea.  Otherwise no other complaints.  ? ?Home Medications ?Prior to Admission medications   ?Medication Sig Start Date End Date Taking? Authorizing Provider  ?acetaminophen (TYLENOL) 500 MG tablet Take 500-1,000 mg by mouth daily as needed for headache (pain).    [provider]  ?albuterol (VENTOLIN HFA) 108 (90 Base) MCG/ACT inhaler Inhale 2 puffs into the lungs every 6 (six) hours as needed for wheezing or shortness of breath. 02/22/20   Thurnell Lose, MD  ?Ascorbic Acid (VITAMIN C PO) Take 1 tablet by mouth See admin instructions. At dialysis [facility] and friday    [provider]  ?AURYXIA 1 GM 210 MG(Fe) tablet Take 420-840 mg by mouth See admin instructions. Take  4 tablets (840 mg) by mouth twice daily with meals, occasionally take 2 tablets (459m) with a snack 05/29/18   [provider]  ?b complex-vitamin c-folic acid (NEPHRO-VITE) 0.8 MG TABS tablet Take 1 tablet by mouth every Monday, Wednesday, and Friday with hemodialysis. 04/12/19   [provider]   ?glucose blood (ONETOUCH VERIO) test strip 1 each by Other route 2 (two) times daily. And lancets 2/day 04/10/20   ERenato Shin MD  ?HUMIRA PEN 40 MG/0.4ML PNKT Inject 40 mg into the skin every 14 (fourteen) days. 08/22/20   [provider]  ?hydrOXYzine (ATARAX/VISTARIL) 25 MG tablet Take 25 mg by mouth every morning.    [provider]  ?Insulin Lispro Prot & Lispro (HUMALOG 75/25 MIX) (75-25) 100 UNIT/ML Kwikpen Inject 35 Units into the skin 2 (two) times daily with a meal. ?Patient taking differently: Inject 35 Units into the skin in the morning and at bedtime. 05/01/20   ERenato Shin MD  ?Methoxy PEG-Epoetin Beta (MIRCERA IJ) Dialysis Monday,Wednesday and friday 12/19/20 12/18/21  [provider]  ?metoprolol succinate (TOPROL-XL) 100 MG 24 hr tablet Take 100 mg by mouth daily. Take with or immediately following a meal.    [provider]  ?Multiple Vitamins-Minerals (ZINC PO) Take 1 tablet by mouth every morning.    [provider]  ?NARCAN 4 MG/0.1ML LIQD nasal spray kit Place 1 spray into the nose as needed (accidental overdose.).  10/10/19   [provider]  ?OVER THE COUNTER MEDICATION Take 1 capsule by mouth every morning. Omega XL    [provider]  ?oxyCODONE-acetaminophen (PERCOCET) 10-325 MG tablet Take 1 tablet by mouth 3 (three) times daily as needed for pain. 11/04/20   [provider]  ?pramipexole (MIRAPEX) 0.5 MG tablet Take 0.5 mg by mouth daily.  05/18/17   [provider]  ?rosuvastatin (CRESTOR) 40 MG tablet TAKE 1 TABLET BY MOUTH DAILY *PATIENT NEEDS APPOINTMENT* ?Patient taking differently: Take 40 mg by mouth daily. 12/08/20   Minus Breeding, MD  ?   ? ?Allergies    ?Bupropion and Dilaudid [hydromorphone]   ? ?Review of Systems   ?Review of Systems ? ?Physical Exam ?Updated Vital Signs ?BP 111/67   Pulse (!) 117   Temp 97.7 ?F (36.5 ?C) (Oral)   Resp 20   Ht 5' 8" (1.727 m)   Wt 119.9 kg   LMP  (LMP  Unknown) Comment: LMP Feb 2011  SpO2 100%   BMI 40.19 kg/m?  ?Physical Exam ?Constitutional:   ?   Comments: Chronically ill-appearing  ?HENT:  ?   Head: Normocephalic and atraumatic.  ?   Nose: Nose normal. No congestion.  ?Eyes:  ?   Extraocular Movements: Extraocular movements intact.  ?   Pupils: Pupils are equal, round, and reactive to light.  ?Cardiovascular:  ?   Rate and Rhythm: Tachycardia present.  ?Chest:  ?   Chest wall: No tenderness.  ?Abdominal:  ?   Tenderness: There is no abdominal tenderness. There is no guarding or rebound.  ?Musculoskeletal:     ?   General: Tenderness and signs of injury present. No deformity.  ?   Cervical back: Normal range of motion. No tenderness.  ?   Comments: Left 5th digit swelling and redness and tenderness. Skin is pealing from the 5th digit.   ?Skin: ?   Capillary Refill: Capillary refill takes more than 3 seconds.  ?   Findings: Lesion present.  ?Neurological:  ?   General: No focal deficit present.  ?   Mental Status: She is oriented to person, place, and time.  ?   Sensory: No sensory deficit.  ?   Motor: No weakness.  ? ? ?ED Results / Procedures / Treatments   ?Labs ?(all labs ordered are listed, but only abnormal results are displayed) ?Labs Reviewed  ?CBC - Abnormal; Notable for the following components:  ?    Result Value  ? WBC 11.8 (*)   ? RBC 2.35 (*)   ? Hemoglobin 7.2 (*)   ? HCT 23.1 (*)   ? All other components within normal limits  ?COMPREHENSIVE METABOLIC PANEL - Abnormal; Notable for the following components:  ? Sodium 129 (*)   ? Chloride 87 (*)   ? Glucose, Bld 400 (*)   ? BUN 27 (*)   ? Creatinine, Ser 4.67 (*)   ? Calcium 8.3 (*)   ? Albumin 2.5 (*)   ? Alkaline Phosphatase 145 (*)   ? GFR, Estimated 10 (*)   ? All other components within normal limits  ?LACTIC ACID, PLASMA - Abnormal; Notable for the following components:  ? Lactic Acid, Venous 2.4 (*)   ? All other components within normal limits  ?CULTURE, BLOOD (ROUTINE X 2)  ?CULTURE,  BLOOD (ROUTINE X 2)  ?LACTIC ACID, PLASMA  ?COMPREHENSIVE METABOLIC PANEL  ?CBC  ? ? ?EKG ?None ? ?Radiology ?DG Foot 2 Views Left ? ?Result Date: 05/25/2021 ?CLINICAL DATA:  Osteomyelitis of fifth digit. EXAM: LEFT FOOT - 2 VIEW COMPARISON:  None. FINDINGS: There is soft tissue swelling and air in the region of the fourth and fifth metatarsals, fourth proximal phalanx, and throughout the fifth toe worrisome for infection. There is fragmentation which may represent erosion versus fracture involving the base of the fifth distal phalanx in the distal aspect of  the fifth proximal phalanx. This appears nondisplaced. No dislocation. There is likely a healed fracture of the fifth proximal metatarsal. Peripheral vascular calcifications are present. IMPRESSION: 1. Soft tissue swelling and air involving the fourth and fifth metatarsals and phalanges worrisome for infection. 2. Fragmentation secondary to osteomyelitis versus fracture involving the fifth proximal and distal phalanx. Please correlate clinically. Electronically Signed   By: Ronney Asters M.D.   On: 05/25/2021 20:33   ? ?Procedures ?Procedures  ? ?Medications Ordered in ED ?Medications  ?vancomycin (VANCOREADY) IVPB 2000 mg/400 mL (2,000 mg Intravenous New Bag/Given 05/25/21 2228)  ?ceFEPIme (MAXIPIME) 1 g in sodium chloride 0.9 % 100 mL IVPB (has no administration in time range)  ?metoprolol succinate (TOPROL-XL) 24 hr tablet 100 mg (has no administration in time range)  ?rosuvastatin (CRESTOR) tablet 40 mg (has no administration in time range)  ?sodium chloride flush (NS) 0.9 % injection 3 mL (3 mLs Intravenous Not Given 05/25/21 2321)  ?acetaminophen (TYLENOL) tablet 650 mg (has no administration in time range)  ?  Or  ?acetaminophen (TYLENOL) suppository 650 mg (has no administration in time range)  ?polyethylene glycol (MIRALAX / GLYCOLAX) packet 17 g (has no administration in time range)  ?insulin aspart (novoLOG) injection 0-6 Units (has no administration in  time range)  ?insulin aspart protamine- aspart (NOVOLOG MIX 70/30) injection 20 Units (has no administration in time range)  ?HYDROcodone-acetaminophen (NORCO) 7.5-325 MG per tablet 1 tablet (has no administration in tim

## 2021-05-25 NOTE — Progress Notes (Signed)
Pharmacy Antibiotic Note ? ?[name] is a 56 y.o. female admitted on 05/25/2021 with  wound infection .  Pharmacy has been consulted for cefepime dosing. ? ?Of note, patient has a h/o of ESRD on MWF HD  ? ?Plan: ?-Cefepime 2 gm IV in the ED followed by cefepime 1 gm IV Q 24 hours  ?-Monitor CBC, cultures and clinical progress ?-F/u if maintenance vancomycin dose is necessary  ? ?Height: '5\' 8"'$  (172.7 cm) ?Weight: 119.9 kg (264 lb 5.3 oz) ?IBW/kg (Calculated) : 63.9 ? ?Temp (24hrs), Avg:100.3 ?F (37.9 ?C), Min:98.7 ?F (37.1 ?C), Max:101.8 ?F (38.8 ?C) ? ?No results for input(s): WBC, CREATININE, LATICACIDVEN, VANCOTROUGH, VANCOPEAK, VANCORANDOM, GENTTROUGH, GENTPEAK, GENTRANDOM, TOBRATROUGH, TOBRAPEAK, TOBRARND, AMIKACINPEAK, AMIKACINTROU, AMIKACIN in the last 168 hours.  ?CrCl cannot be calculated (Patient's most recent lab result is older than the maximum 21 days allowed.).   ? ?Allergies  ?Allergen Reactions  ? Bupropion Itching  ? Dilaudid [Hydromorphone] Other (See Comments)  ?  Apnea, required intubation  ? ? ?Antimicrobials this admission: ?Cefepime 4/24 >>  ?Vancomycin 4/24 >>  ? ?Dose adjustments this admission: ? ? ?Microbiology results: ?4/24 BCx:  ? ?Thank you for allowing pharmacy to be a part of this patient?s care. ? ?Albertina Parr, PharmD., BCCCP ?Clinical Pharmacist ?Please refer to AMION for unit-specific pharmacist  ? ? ?

## 2021-05-25 NOTE — ED Notes (Signed)
[facility] called and en route ? ?Charge RN [employer] notified of transfer and given report ?

## 2021-05-25 NOTE — ED Notes (Signed)
Unable to give vancomycin at this time d/t difficult IV starts. EDP at bedside attempting [place] IV and set of blood cultures. Phlebotomy also at bedside. ?

## 2021-05-25 NOTE — ED Notes (Signed)
EDP attempted [place] IV, no success. IV team consult ordered. ?

## 2021-05-25 NOTE — ED Notes (Signed)
This RN noticed pt's HR jump from 90s to 120s. I assessed pt and she stated she wasn't symptomatic to her HR changing. EKG completed and given to EDP. ?

## 2021-05-25 NOTE — ED Notes (Signed)
Patient is being discharged from the Urgent Care and sent to the Emergency Department via [facility] . Per dr Lanny Cramp, patient is in need of higher level of care due to complex history and presentation. Patient is aware and verbalizes understanding of plan of care.  ?Vitals:  ? 05/25/21 1938 05/25/21 1945  ?BP: (!) 158/128   ?Pulse: (!) 142   ?Resp: 20   ?Temp:  98.7 ?F (37.1 ?C)  ?SpO2: (!) 89%   ?  ?

## 2021-05-25 NOTE — ED Triage Notes (Signed)
Pt presents with infected left pinky toe from unknown source; pt states the toe started bleeding yesterday and had a foul odor. ?

## 2021-05-25 NOTE — Progress Notes (Signed)
Consult received for left 5th metatarsal ulcer. ? ?Given the nature, extent, and duration of the wound as well as the patient's comorbidities, I have asked my colleague, Dr. Meridee Score, for his assistance with further assessment and management given his expertise and experience in this domain. Either Dr. Sharol Given or I will evaluate the patient tomorrow. ? ?Altamese Umatilla, MD ?[facility], [place] ?[phone] ? ?

## 2021-05-25 NOTE — ED Notes (Signed)
Pt placed on the monitor as requested by CN. Pt is on O2 [place] with poor SPO2 reading, low BP and fever. CN notified for review of bed placement. ?

## 2021-05-25 NOTE — Progress Notes (Signed)
PIV consult: Arrived to ED, EJ placed by MD. No additional access needed at this time, per MD. ?

## 2021-05-25 NOTE — H&P (Addendum)
?History and Physical  ? ?[name]:[id number] DOB: Jun 09, 1965 DOA: 05/25/2021 ? ?PCP: Sandi Mariscal, MD  ? ?Patient coming from: Home ? ?Chief Complaint: Concern for acute infection ? ?HPI: [name] is a 56 y.o. female with medical history significant of hypertension, diabetes, hyperlipidemia, CHF, ESRD on HD, chronic pain, CAD, anemia, Buerger disease, rheumatoid arthritis presenting with concern for infected left fifth toe. ? ?Patient presenting with concern for possible infection of left fifth toe.  Patient reports 3 days of bleeding noted at the toe.  Also noted a foul order starting yesterday.  She does not report any pain that she has significant neuropathy. ? ?She denies fevers, chills, chest pain, shortness of breath, abdominal pain, constipation, diarrhea, nausea, vomiting. ? ?Of note, she states she completed her last dialysis session today but states they were plan to do an additional session tomorrow as she was volume overloaded but they stopped being able to get a good blood pressure so they took her off machine. ? ?ED Course: Vital signs in the ED significant for fever to 101.8, initial tachycardia to 140s improved to the 90s in the ED, blood pressure ranging in the 48G to 891Q systolic.  On 4 L supplemental oxygen.  Lab work-up included CMP with sodium 129 which Corrects to 134 considering glucose of 400, chloride 87, BUN 27, creatinine stable at 4.67 in the setting of ESRD, calcium 8.3, albumin 2.5, alk phos stable 145.  CBC with leukocytosis to 11.8 hemoglobin 7.2 which is similar to previous 1 month ago but down from 9 2 months ago.  Lactic acid elevated at 2.4 on first check with repeat pending.  Blood cultures pending.  Left foot x-ray showed soft tissue swelling in the area of the fourth and fifth metatarsal concerning for infection with bony changes concerning for osteomyelitis versus fracture at the fifth phalanx.  Patient started on vancomycin and cefepime in the ED as well as  received a dose of morphine.  Orthopedics consulted and will see the patient with plan for possible surgery tomorrow.  Recommend n.p.o. at midnight and continue with antibiotics. ? ?Review of Systems: As per HPI otherwise all other systems reviewed and are negative. ? ?Past Medical History:  ?Diagnosis Date  ? Anaphylactic shock, unspecified, initial encounter 09/04/2018  ? Anemia   ? Anxiety   ? CAD (coronary artery disease)   ? Nonobstructive on CT 2019  ? CHF (congestive heart failure) ([place])   ? Chronic bilateral pleural effusions 10/23/2018  ? COVID-19 10/2018  ? COVID-19 virus detected 10/10/2018  ? COVID-19 virus infection 09/18/2018  ? ESRD (end stage renal disease) ([place])   ? Dialysis TTHSat- 3rd st  ? Gangrene ([place]) 09/18/2018  ? Headache(784.0)   ? High cholesterol   ? History of blood transfusion   ? Hypertension   ? Mitral regurgitation   ? moderate to severe MR 10/2018 echo  ? Nerve pain   ? "they say I have L5 nerve damage; my lumbar"  ? Neuropathy 01/29/2011  ? 04/2011 MRI L-spine:  L5-S1: Bulge/shallow broad-based protrusion greatest centrally and in the right posterior lateral position. Minimal crowding of the  upper aspect of the S1 nerve root greater on the right. Left lateral disc osteophyte with mild encroachment upon but not  significant compression of the exiting left L5 nerve root.   05/2011: Dr. Sherwood Gambler ([facility]): DJD and mild disc bu  ? Pericardial effusion   ? Pneumonia   ? ; 11/16/2018- "touch of pneumonia" -  seen in ED- 11/14/2018- on antibiotic. Has had it x 2  ? Pneumonia 10/10/2018  ? PVD (peripheral vascular disease) ([place])   ? Right leg stent in [place].  (No records)  ? Restless legs   ? Rheumatoid arthritis ([place])   ? "knees" "hands", "RA"  ? Sleep apnea   ? does not use Cpap  ? Thoracic ascending aortic aneurysm ([place])   ? 4.4 cm 11/14/18 CTA  ? Type II diabetes mellitus ([place])   ? Uterine leiomyoma 01/10/2021  ? ? ?Past Surgical History:  ?Procedure Laterality Date  ?  AMPUTATION Left 12/27/2018  ? Procedure: LEFT THUMB REVISION AMPUTATION DIGIT;  Surgeon: Charlotte Crumb, MD;  Location: [place];  Service: Orthopedics;  Laterality: Left;  ? AMPUTATION Left 01/15/2021  ? Procedure: AMPUTATION OF DIGIT LEFT INDEX FINGER;  Surgeon: Leanora Cover, MD;  Location: [place];  Service: Orthopedics;  Laterality: Left;  ? AV FISTULA PLACEMENT Left   ? AV FISTULA PLACEMENT Right 03/12/2019  ? Procedure: INSERTION OF ARTERIOVENOUS (AV) GORE-TEX GRAFT ARM;  Surgeon: Elam Dutch, MD;  [facility];  Service: Vascular;  Laterality: Right;  ? AV FISTULA PLACEMENT Right 08/13/2019  ? Procedure: RIGHT UPPER ARM ARTERIOVENOUS (AV) FISTULA CREATION WITH BASILIC VEIN;  Surgeon: Elam Dutch, MD;  Location: [place];  Service: Vascular;  Laterality: Right;  ? [place]; 1997  ? x 2  ? COLONOSCOPY    ? EYE SURGERY Bilateral   ? cataract surgery  ? I & D EXTREMITY Left 01/09/2021  ? Procedure: IRRIGATION AND DEBRIDEMENT INDEX FINGER;  Surgeon: Leanora Cover, MD;  Location: [place];  Service: Orthopedics;  Laterality: Left;  ? INSERTION OF DIALYSIS CATHETER Right 09/26/2018  ? Procedure: INSERTION OF DIALYSIS CATHETER Right Internal Jugular.;  Surgeon: Elam Dutch, MD;  Location: [facility] OR;  Service: Vascular;  Laterality: Right;  ? IR DIALY SHUNT INTRO NEEDLE/INTRACATH INITIAL W/IMG RIGHT Right 11/21/2020  ? IR FLUORO GUIDE CV LINE RIGHT  05/04/2019  ? IR US GUIDE VASC ACCESS RIGHT  05/04/2019  ? IR US GUIDE VASC ACCESS RIGHT  11/21/2020  ? LIGATION OF ARTERIOVENOUS  FISTULA Left 09/26/2018  ? Procedure: LIGATION OF ARTERIOVENOUS  FISTULA LEFT ARM;  Surgeon: Elam Dutch, MD;  Location: [place];  Service: Vascular;  Laterality: Left;  ? PERICARDIAL WINDOW  02/2004  ? for pericardial effusion  ? PERIPHERAL VASCULAR INTERVENTION Right 10/26/2019  ? Procedure: PERIPHERAL VASCULAR INTERVENTION;  Surgeon: Elam Dutch, MD;  Location: [place] [facility];  Service: Cardiovascular;  Laterality:  Right;  arm  AV fistula  ? TUBAL LIGATION  05/1995  ? UPPER EXTREMITY VENOGRAPHY N/A 06/22/2019  ? Procedure: UPPER EXTREMITY VENOGRAPHY - Right Central;  Surgeon: Elam Dutch, MD;  Location: [place] [facility];  Service: Cardiovascular;  Laterality: N/A;  ? ? ?Social History ? reports that she has been smoking cigarettes. She has a 8.25 pack-year smoking history. She has never used smokeless tobacco. She reports that she does not drink alcohol and does not use drugs. ? ?Allergies  ?Allergen Reactions  ? Bupropion Itching  ? Dilaudid [Hydromorphone] Other (See Comments)  ?  Apnea, required intubation  ? ? ?Family History  ?Problem Relation Age of Onset  ? Cancer Brother   ? Heart disease Father   ?     Died age 75  ? Diabetes Father   ? Hyperlipidemia Father   ? Hypertension Father   ? Stroke Father   ?  Diabetes Mother   ? Hypertension Mother   ? Stroke Mother   ? Dementia Mother   ? Diabetes Sister   ? Diabetes Brother   ? Hypertension Sister   ? Hypertension Brother   ?Reviewed on admission ? ?Prior to Admission medications   ?Medication Sig Start Date End Date Taking? Authorizing Provider  ?acetaminophen (TYLENOL) 500 MG tablet Take 500-1,000 mg by mouth daily as needed for headache (pain).    [provider]  ?albuterol (VENTOLIN HFA) 108 (90 Base) MCG/ACT inhaler Inhale 2 puffs into the lungs every 6 (six) hours as needed for wheezing or shortness of breath. 02/22/20   Thurnell Lose, MD  ?Ascorbic Acid (VITAMIN C PO) Take 1 tablet by mouth See admin instructions. At dialysis [facility] and friday    [provider]  ?AURYXIA 1 GM 210 MG(Fe) tablet Take 420-840 mg by mouth See admin instructions. Take  4 tablets (840 mg) by mouth twice daily with meals, occasionally take 2 tablets (424m) with a snack 05/29/18   [provider]  ?b complex-vitamin c-folic acid (NEPHRO-VITE) 0.8 MG TABS tablet Take 1 tablet by mouth every Monday, Wednesday, and Friday with hemodialysis.  04/12/19   [provider]  ?glucose blood (ONETOUCH VERIO) test strip 1 each by Other route 2 (two) times daily. And lancets 2/day 04/10/20   ERenato Shin MD  ?HUMIRA PEN 40 MG/0.4ML PNKT Inject 40

## 2021-05-25 NOTE — ED Notes (Signed)
Attempted to get pulse ox reading.  Patient currently on  3 liters O2.  Patient doses off easily, but wakens to name.  Patient reports she has graft in right arm.  Patient reports dialysis has issues getting blood pressure readings .  Patient has a tremor and cool fingers making pulse ox readings difficult to obtain and question accuracy of heart rate readings based on equipment.  Patient answers in appropriate, and complete sentences.  Patient came to [facility] for to weeping.  Patient has left little toe oozing, foul odor.  Sister arrived and states patient is presenting at her baseline.  Patient and sister states patient dozes off easily, that this is baseline.   ?

## 2021-05-25 NOTE — ED Triage Notes (Signed)
HD pt MWF, last HD today brought to ED by care link with left foot toe infection with a lot of drainage and smell. Pt is AO x 4 on arrival.  ?

## 2021-05-26 ENCOUNTER — Observation Stay (HOSPITAL_COMMUNITY): Payer: Medicare Other

## 2021-05-26 DIAGNOSIS — R7881 Bacteremia: Secondary | ICD-10-CM | POA: Diagnosis present

## 2021-05-26 DIAGNOSIS — A419 Sepsis, unspecified organism: Secondary | ICD-10-CM | POA: Diagnosis not present

## 2021-05-26 DIAGNOSIS — I739 Peripheral vascular disease, unspecified: Secondary | ICD-10-CM | POA: Diagnosis not present

## 2021-05-26 DIAGNOSIS — E43 Unspecified severe protein-calorie malnutrition: Secondary | ICD-10-CM | POA: Diagnosis not present

## 2021-05-26 DIAGNOSIS — E1122 Type 2 diabetes mellitus with diabetic chronic kidney disease: Secondary | ICD-10-CM | POA: Diagnosis present

## 2021-05-26 DIAGNOSIS — G9341 Metabolic encephalopathy: Secondary | ICD-10-CM | POA: Diagnosis not present

## 2021-05-26 DIAGNOSIS — Y92239 Unspecified place in hospital as the place of occurrence of the external cause: Secondary | ICD-10-CM | POA: Diagnosis not present

## 2021-05-26 DIAGNOSIS — I5032 Chronic diastolic (congestive) heart failure: Secondary | ICD-10-CM | POA: Diagnosis present

## 2021-05-26 DIAGNOSIS — N2581 Secondary hyperparathyroidism of renal origin: Secondary | ICD-10-CM | POA: Diagnosis present

## 2021-05-26 DIAGNOSIS — N186 End stage renal disease: Secondary | ICD-10-CM

## 2021-05-26 DIAGNOSIS — Z6841 Body Mass Index (BMI) 40.0 and over, adult: Secondary | ICD-10-CM | POA: Diagnosis not present

## 2021-05-26 DIAGNOSIS — D631 Anemia in chronic kidney disease: Secondary | ICD-10-CM | POA: Diagnosis present

## 2021-05-26 DIAGNOSIS — M86272 Subacute osteomyelitis, left ankle and foot: Secondary | ICD-10-CM

## 2021-05-26 DIAGNOSIS — S91302A Unspecified open wound, left foot, initial encounter: Secondary | ICD-10-CM | POA: Diagnosis not present

## 2021-05-26 DIAGNOSIS — E662 Morbid (severe) obesity with alveolar hypoventilation: Secondary | ICD-10-CM | POA: Diagnosis present

## 2021-05-26 DIAGNOSIS — E1140 Type 2 diabetes mellitus with diabetic neuropathy, unspecified: Secondary | ICD-10-CM | POA: Diagnosis present

## 2021-05-26 DIAGNOSIS — E1169 Type 2 diabetes mellitus with other specified complication: Secondary | ICD-10-CM | POA: Diagnosis present

## 2021-05-26 DIAGNOSIS — E1152 Type 2 diabetes mellitus with diabetic peripheral angiopathy with gangrene: Secondary | ICD-10-CM | POA: Diagnosis present

## 2021-05-26 DIAGNOSIS — D62 Acute posthemorrhagic anemia: Secondary | ICD-10-CM | POA: Diagnosis not present

## 2021-05-26 DIAGNOSIS — Z992 Dependence on renal dialysis: Secondary | ICD-10-CM | POA: Diagnosis not present

## 2021-05-26 DIAGNOSIS — M869 Osteomyelitis, unspecified: Secondary | ICD-10-CM | POA: Diagnosis present

## 2021-05-26 DIAGNOSIS — E11621 Type 2 diabetes mellitus with foot ulcer: Secondary | ICD-10-CM | POA: Diagnosis present

## 2021-05-26 DIAGNOSIS — Z8616 Personal history of COVID-19: Secondary | ICD-10-CM | POA: Diagnosis not present

## 2021-05-26 DIAGNOSIS — G4730 Sleep apnea, unspecified: Secondary | ICD-10-CM | POA: Diagnosis not present

## 2021-05-26 DIAGNOSIS — J9611 Chronic respiratory failure with hypoxia: Secondary | ICD-10-CM | POA: Diagnosis present

## 2021-05-26 DIAGNOSIS — I70262 Atherosclerosis of native arteries of extremities with gangrene, left leg: Secondary | ICD-10-CM

## 2021-05-26 DIAGNOSIS — I509 Heart failure, unspecified: Secondary | ICD-10-CM

## 2021-05-26 DIAGNOSIS — I2510 Atherosclerotic heart disease of native coronary artery without angina pectoris: Secondary | ICD-10-CM

## 2021-05-26 DIAGNOSIS — G2581 Restless legs syndrome: Secondary | ICD-10-CM | POA: Diagnosis present

## 2021-05-26 DIAGNOSIS — E871 Hypo-osmolality and hyponatremia: Secondary | ICD-10-CM | POA: Diagnosis not present

## 2021-05-26 DIAGNOSIS — M069 Rheumatoid arthritis, unspecified: Secondary | ICD-10-CM | POA: Diagnosis present

## 2021-05-26 DIAGNOSIS — F419 Anxiety disorder, unspecified: Secondary | ICD-10-CM | POA: Diagnosis not present

## 2021-05-26 DIAGNOSIS — E1151 Type 2 diabetes mellitus with diabetic peripheral angiopathy without gangrene: Secondary | ICD-10-CM | POA: Diagnosis not present

## 2021-05-26 DIAGNOSIS — Z794 Long term (current) use of insulin: Secondary | ICD-10-CM | POA: Diagnosis not present

## 2021-05-26 DIAGNOSIS — I132 Hypertensive heart and chronic kidney disease with heart failure and with stage 5 chronic kidney disease, or end stage renal disease: Secondary | ICD-10-CM | POA: Diagnosis present

## 2021-05-26 DIAGNOSIS — I953 Hypotension of hemodialysis: Secondary | ICD-10-CM | POA: Diagnosis not present

## 2021-05-26 DIAGNOSIS — I4891 Unspecified atrial fibrillation: Secondary | ICD-10-CM | POA: Diagnosis not present

## 2021-05-26 LAB — COMPREHENSIVE METABOLIC PANEL
ALT: 14 U/L (ref 0–44)
AST: 15 U/L (ref 15–41)
Albumin: 2.4 g/dL — ABNORMAL LOW (ref 3.5–5.0)
Alkaline Phosphatase: 119 U/L (ref 38–126)
Anion gap: 16 — ABNORMAL HIGH (ref 5–15)
BUN: 34 mg/dL — ABNORMAL HIGH (ref 6–20)
CO2: 25 mmol/L (ref 22–32)
Calcium: 8 mg/dL — ABNORMAL LOW (ref 8.9–10.3)
Chloride: 88 mmol/L — ABNORMAL LOW (ref 98–111)
Creatinine, Ser: 5 mg/dL — ABNORMAL HIGH (ref 0.44–1.00)
GFR, Estimated: 10 mL/min — ABNORMAL LOW (ref >60)
Glucose, Bld: 308 mg/dL — ABNORMAL HIGH (ref 70–99)
Potassium: 4 mmol/L (ref 3.5–5.1)
Sodium: 129 mmol/L — ABNORMAL LOW (ref 135–145)
Total Bilirubin: 0.4 mg/dL (ref 0.3–1.2)
Total Protein: 7.1 g/dL (ref 6.5–8.1)

## 2021-05-26 LAB — BLOOD CULTURE ID PANEL (REFLEXED) - BCID2

## 2021-05-26 LAB — CBC
HCT: 22.6 % — ABNORMAL LOW (ref 36.0–46.0)
HCT: 26.7 % — ABNORMAL LOW (ref 36.0–46.0)
Hemoglobin: 7 g/dL — ABNORMAL LOW (ref 12.0–15.0)
Hemoglobin: 8.4 g/dL — ABNORMAL LOW (ref 12.0–15.0)
MCH: 30.8 pg (ref 26.0–34.0)
MCH: 30.2 pg (ref 26.0–34.0)
MCHC: 31 g/dL (ref 30.0–36.0)
MCHC: 31.5 g/dL (ref 30.0–36.0)
MCV: 99.6 fL (ref 80.0–100.0)
MCV: 96 fL (ref 80.0–100.0)
Platelets: 234 10*3/uL (ref 150–400)
Platelets: 254 10*3/uL (ref 150–400)
RBC: 2.27 MIL/uL — ABNORMAL LOW (ref 3.87–5.11)
RBC: 2.78 MIL/uL — ABNORMAL LOW (ref 3.87–5.11)
RDW: 14.2 % (ref 11.5–15.5)
RDW: 15.5 % (ref 11.5–15.5)
WBC: 11.5 10*3/uL — ABNORMAL HIGH (ref 4.0–10.5)
WBC: 12.9 10*3/uL — ABNORMAL HIGH (ref 4.0–10.5)
nRBC: 0 % (ref 0.0–0.2)
nRBC: 0 % (ref 0.0–0.2)

## 2021-05-26 LAB — RENAL FUNCTION PANEL
Albumin: 2.4 g/dL — ABNORMAL LOW (ref 3.5–5.0)
Albumin: 2.9 g/dL — ABNORMAL LOW (ref 3.5–5.0)
Anion gap: 18 — ABNORMAL HIGH (ref 5–15)
Anion gap: 17 — ABNORMAL HIGH (ref 5–15)
BUN: 34 mg/dL — ABNORMAL HIGH (ref 6–20)
BUN: 43 mg/dL — ABNORMAL HIGH (ref 6–20)
CO2: 24 mmol/L (ref 22–32)
CO2: 28 mmol/L (ref 22–32)
Calcium: 8.1 mg/dL — ABNORMAL LOW (ref 8.9–10.3)
Calcium: 8.9 mg/dL (ref 8.9–10.3)
Chloride: 90 mmol/L — ABNORMAL LOW (ref 98–111)
Chloride: 89 mmol/L — ABNORMAL LOW (ref 98–111)
Creatinine, Ser: 4.99 mg/dL — ABNORMAL HIGH (ref 0.44–1.00)
Creatinine, Ser: 5.85 mg/dL — ABNORMAL HIGH (ref 0.44–1.00)
GFR, Estimated: 10 mL/min — ABNORMAL LOW (ref >60)
GFR, Estimated: 8 mL/min — ABNORMAL LOW (ref >60)
Glucose, Bld: 297 mg/dL — ABNORMAL HIGH (ref 70–99)
Glucose, Bld: 282 mg/dL — ABNORMAL HIGH (ref 70–99)
Phosphorus: 4 mg/dL (ref 2.5–4.6)
Phosphorus: 5 mg/dL — ABNORMAL HIGH (ref 2.5–4.6)
Potassium: 4.1 mmol/L (ref 3.5–5.1)
Potassium: 4.7 mmol/L (ref 3.5–5.1)
Sodium: 132 mmol/L — ABNORMAL LOW (ref 135–145)
Sodium: 134 mmol/L — ABNORMAL LOW (ref 135–145)

## 2021-05-26 LAB — GLUCOSE, CAPILLARY
Glucose-Capillary: 301 mg/dL — ABNORMAL HIGH (ref 70–99)
Glucose-Capillary: 162 mg/dL — ABNORMAL HIGH (ref 70–99)

## 2021-05-26 LAB — PREPARE RBC (CROSSMATCH)

## 2021-05-26 LAB — HEPATITIS B SURFACE ANTIGEN: Hepatitis B Surface Ag: NONREACTIVE

## 2021-05-26 LAB — CBG MONITORING, ED: Glucose-Capillary: 270 mg/dL — ABNORMAL HIGH (ref 70–99)

## 2021-05-26 LAB — LACTIC ACID, PLASMA: Lactic Acid, Venous: 1.9 mmol/L (ref 0.5–1.9)

## 2021-05-26 MED ORDER — HEPARIN SODIUM (PORCINE) 1000 UNIT/ML DIALYSIS: 1000.0000 [IU] | INTRAMUSCULAR | Status: DC | PRN

## 2021-05-26 MED ORDER — SODIUM CHLORIDE 0.9 % IV SOLN: 100.0000 mL | INTRAVENOUS | Status: DC | PRN

## 2021-05-26 MED ORDER — ALTEPLASE 2 MG IJ SOLR: 2.0000 mg | Freq: Once | INTRAMUSCULAR | Status: DC | PRN

## 2021-05-26 MED ORDER — ALBUMIN HUMAN 25 % IV SOLN
25.0000 g | Freq: Once | INTRAVENOUS | Status: AC
  Administered 2021-05-26: 25 g via INTRAVENOUS

## 2021-05-26 MED ORDER — LIDOCAINE HCL (PF) 1 % IJ SOLN
5.0000 mL | INTRAMUSCULAR | Status: DC | PRN
  Filled 2021-05-26: qty 5

## 2021-05-26 MED ORDER — CHLORHEXIDINE GLUCONATE CLOTH 2 % EX PADS
6.0000 | MEDICATED_PAD | Freq: Every day | CUTANEOUS | Status: DC
  Administered 2021-05-26 – 2021-06-01 (×5): 6 via TOPICAL

## 2021-05-26 MED ORDER — INSULIN ASPART 100 UNIT/ML IJ SOLN
0.0000 [IU] | Freq: Three times a day (TID) | INTRAMUSCULAR | Status: DC
  Administered 2021-05-26: 4 [IU] via SUBCUTANEOUS
  Administered 2021-05-26: 3 [IU] via SUBCUTANEOUS
  Administered 2021-05-27: 2 [IU] via SUBCUTANEOUS
  Administered 2021-05-27: 3 [IU] via SUBCUTANEOUS
  Administered 2021-05-28 (×2): 1 [IU] via SUBCUTANEOUS
  Administered 2021-05-30 – 2021-05-31 (×5): 2 [IU] via SUBCUTANEOUS
  Administered 2021-05-31: 4 [IU] via SUBCUTANEOUS
  Administered 2021-06-01: 1 [IU] via SUBCUTANEOUS
  Administered 2021-06-01 (×2): 2 [IU] via SUBCUTANEOUS
  Administered 2021-06-02: 3 [IU] via SUBCUTANEOUS
  Administered 2021-06-02: 2 [IU] via SUBCUTANEOUS
  Administered 2021-06-03: 3 [IU] via SUBCUTANEOUS
  Administered 2021-06-05 – 2021-06-06 (×2): 2 [IU] via SUBCUTANEOUS
  Administered 2021-06-08 – 2021-06-09 (×4): 1 [IU] via SUBCUTANEOUS
  Administered 2021-06-10: 3 [IU] via SUBCUTANEOUS

## 2021-05-26 MED ORDER — INSULIN ASPART PROT & ASPART (70-30 MIX) 100 UNIT/ML ~~LOC~~ SUSP
20.0000 [IU] | Freq: Two times a day (BID) | SUBCUTANEOUS | Status: DC
  Administered 2021-05-26 – 2021-05-28 (×3): 20 [IU] via SUBCUTANEOUS
  Filled 2021-05-26: qty 10

## 2021-05-26 MED ORDER — HEPARIN SODIUM (PORCINE) 1000 UNIT/ML DIALYSIS
8000.0000 [IU] | INTRAMUSCULAR | Status: DC | PRN
  Filled 2021-05-26: qty 8

## 2021-05-26 MED ORDER — MIDODRINE HCL 5 MG PO TABS
10.0000 mg | ORAL_TABLET | Freq: Once | ORAL | Status: AC
  Administered 2021-05-26: 10 mg via ORAL

## 2021-05-26 MED ORDER — LIDOCAINE-PRILOCAINE 2.5-2.5 % EX CREA
1.0000 "application " | TOPICAL_CREAM | CUTANEOUS | Status: DC | PRN
  Filled 2021-05-26: qty 5

## 2021-05-26 MED ORDER — HEPARIN SODIUM (PORCINE) 1000 UNIT/ML DIALYSIS
1000.0000 [IU] | INTRAMUSCULAR | Status: DC | PRN
  Filled 2021-05-26: qty 1

## 2021-05-26 MED ORDER — PENTAFLUOROPROP-TETRAFLUOROETH EX AERO: 1.0000 "application " | INHALATION_SPRAY | CUTANEOUS | Status: DC | PRN

## 2021-05-26 MED ORDER — CHLORHEXIDINE GLUCONATE CLOTH 2 % EX PADS
6.0000 | MEDICATED_PAD | Freq: Every day | CUTANEOUS | Status: DC
  Administered 2021-05-27 – 2021-06-11 (×17): 6 via TOPICAL

## 2021-05-26 MED ORDER — MIDODRINE HCL 5 MG PO TABS
ORAL_TABLET | ORAL | Status: AC
  Filled 2021-05-26: qty 2

## 2021-05-26 MED ORDER — PENTAFLUOROPROP-TETRAFLUOROETH EX AERO
1.0000 "application " | INHALATION_SPRAY | CUTANEOUS | Status: DC | PRN
  Filled 2021-05-26: qty 116

## 2021-05-26 MED ORDER — ALBUMIN HUMAN 25 % IV SOLN
INTRAVENOUS | Status: AC
  Filled 2021-05-26: qty 100

## 2021-05-26 MED ORDER — SODIUM CHLORIDE 0.9% IV SOLUTION: Freq: Once | INTRAVENOUS | Status: AC

## 2021-05-26 MED ORDER — INSULIN ASPART 100 UNIT/ML IJ SOLN
0.0000 [IU] | Freq: Every day | INTRAMUSCULAR | Status: DC
  Administered 2021-05-26: 5 [IU] via SUBCUTANEOUS
  Administered 2021-05-28: 2 [IU] via SUBCUTANEOUS
  Administered 2021-05-29: 4 [IU] via SUBCUTANEOUS
  Administered 2021-05-30: 2 [IU] via SUBCUTANEOUS
  Administered 2021-05-31: 4 [IU] via SUBCUTANEOUS
  Administered 2021-06-01 – 2021-06-05 (×3): 3 [IU] via SUBCUTANEOUS
  Administered 2021-06-08 – 2021-06-10 (×3): 2 [IU] via SUBCUTANEOUS

## 2021-05-26 MED ORDER — LIDOCAINE HCL (PF) 1 % IJ SOLN: 5.0000 mL | INTRAMUSCULAR | Status: DC | PRN

## 2021-05-26 NOTE — Consult Note (Addendum)
[facility] [place] ?CONSULT NOTE ? ? ?MRN : [id number] ? ?Reason for Consult: Left foot with ischemic 5th toe ulcer ?Referring Physician: ED ? ?History of Present Illness: 56 y/o female with 2 week history of left 5th toe bleeding followed by pain.  She has noted malodor.  She states the past 3 months she has been WC bound for mobility.  Prior to that she ambulated short distances with a Rolator until it broke.  She denies history of claudication, rest pain or previous non healing wounds.   ? She has a history of finger loss due to non healing wounds with osteomyelitis.  She has been seen by our practice in the past for ESRD and access.  No history of intervention of lower extremities. ?  Past medical history includes: CAD, CHF, COVID,Cholesterolemia, RA, uncontrolled DM and ESRD on HD MWF. ? ? ?Current Facility-Administered Medications  ?Medication Dose Route Frequency Provider Last Rate Last Admin  ? acetaminophen (TYLENOL) tablet 650 mg  650 mg Oral Q6H PRN Marcelyn Bruins, MD      ? Or  ? acetaminophen (TYLENOL) suppository 650 mg  650 mg Rectal Q6H PRN Marcelyn Bruins, MD      ? ceFEPIme (MAXIPIME) 1 g in sodium chloride 0.9 % 100 mL IVPB  1 g Intravenous Q24H Marcelyn Bruins, MD      ? HYDROcodone-acetaminophen (NORCO/VICODIN) 5-325 MG per tablet 1.5 tablet  1.5 tablet Oral Q6H PRN Marcelyn Bruins, MD   1.5 tablet at 05/26/21 4496  ? insulin aspart (novoLOG) injection 0-5 Units  0-5 Units Subcutaneous QHS Marcelyn Bruins, MD   5 Units at 05/26/21 0026  ? insulin aspart (novoLOG) injection 0-6 Units  0-6 Units Subcutaneous TID WC Marcelyn Bruins, MD   3 Units at 05/26/21 0802  ? insulin aspart protamine- aspart (NOVOLOG MIX 70/30) injection 20 Units  20 Units Subcutaneous BID WC Marcelyn Bruins, MD   20 Units at 05/26/21 0040  ? metoprolol succinate (TOPROL-XL) 24 hr tablet 100 mg  100 mg Oral Daily Marcelyn Bruins, MD      ? polyethylene glycol (MIRALAX /  GLYCOLAX) packet 17 g  17 g Oral Daily PRN Marcelyn Bruins, MD      ? rosuvastatin (CRESTOR) tablet 40 mg  40 mg Oral Daily Marcelyn Bruins, MD   40 mg at 05/26/21 7591  ? sodium chloride flush (NS) 0.9 % injection 3 mL  3 mL Intravenous Q12H Marcelyn Bruins, MD      ? ?Current Outpatient Medications  ?Medication Sig Dispense Refill  ? acetaminophen (TYLENOL) 500 MG tablet Take 500-1,000 mg by mouth daily as needed for headache (pain).    ? albuterol (VENTOLIN HFA) 108 (90 Base) MCG/ACT inhaler Inhale 2 puffs into the lungs every 6 (six) hours as needed for wheezing or shortness of breath. 6.7 g 0  ? Ascorbic Acid (VITAMIN C PO) Take 1 tablet by mouth See admin instructions. At dialysis Monday,Wednesday and friday    ? AURYXIA 1 GM 210 MG(Fe) tablet Take 420-840 mg by mouth See admin instructions. Take  4 tablets (840 mg) by mouth twice daily with meals, occasionally take 2 tablets (470m) with a snack    ? b complex-vitamin c-folic acid (NEPHRO-VITE) 0.8 MG TABS tablet Take 1 tablet by mouth every Monday, Wednesday, and Friday with hemodialysis.    ? glucose blood (ONETOUCH VERIO) test strip 1 each by Other route 2 (two) times daily.  And lancets 2/day 200 each 3  ? HUMIRA PEN 40 MG/0.4ML PNKT Inject 40 mg into the skin every 14 (fourteen) days.    ? hydrOXYzine (ATARAX/VISTARIL) 25 MG tablet Take 25 mg by mouth every morning.    ? Insulin Lispro Prot & Lispro (HUMALOG 75/25 MIX) (75-25) 100 UNIT/ML Kwikpen Inject 35 Units into the skin 2 (two) times daily with a meal. (Patient taking differently: Inject 35 Units into the skin in the morning and at bedtime.) 45 mL 3  ? Methoxy PEG-Epoetin Beta (MIRCERA IJ) Dialysis Monday,Wednesday and friday    ? metoprolol succinate (TOPROL-XL) 100 MG 24 hr tablet Take 100 mg by mouth daily. Take with or immediately following a meal.    ? Multiple Vitamins-Minerals (ZINC PO) Take 1 tablet by mouth every morning.    ? NARCAN 4 MG/0.1ML LIQD nasal spray kit Place 1 spray  into the nose as needed (accidental overdose.).     ? OVER THE COUNTER MEDICATION Take 1 capsule by mouth every morning. Omega XL    ? oxyCODONE-acetaminophen (PERCOCET) 10-325 MG tablet Take 1 tablet by mouth 3 (three) times daily as needed for pain.    ? pramipexole (MIRAPEX) 0.5 MG tablet Take 0.5 mg by mouth daily.   1  ? rosuvastatin (CRESTOR) 40 MG tablet TAKE 1 TABLET BY MOUTH DAILY *PATIENT NEEDS APPOINTMENT* (Patient taking differently: Take 40 mg by mouth daily.) 30 tablet 10  ? ? ?Pt meds include: ?Statin :Yes ?Betablocker: Yes ?ASA: No ?Other anticoagulants/antiplatelets: none ? ?Past Medical History:  ?Diagnosis Date  ? Anaphylactic shock, unspecified, initial encounter 09/04/2018  ? Anemia   ? Anxiety   ? CAD (coronary artery disease)   ? Nonobstructive on CT 2019  ? CHF (congestive heart failure) ([place])   ? Chronic bilateral pleural effusions 10/23/2018  ? COVID-19 10/2018  ? COVID-19 virus detected 10/10/2018  ? COVID-19 virus infection 09/18/2018  ? ESRD (end stage renal disease) ([place])   ? Dialysis TTHSat- 3rd st  ? Gangrene ([place]) 09/18/2018  ? Headache(784.0)   ? High cholesterol   ? History of blood transfusion   ? Hypertension   ? Mitral regurgitation   ? moderate to severe MR 10/2018 echo  ? Nerve pain   ? "they say I have L5 nerve damage; my lumbar"  ? Neuropathy 01/29/2011  ? 04/2011 MRI L-spine:  L5-S1: Bulge/shallow broad-based protrusion greatest centrally and in the right posterior lateral position. Minimal crowding of the  upper aspect of the S1 nerve root greater on the right. Left lateral disc osteophyte with mild encroachment upon but not  significant compression of the exiting left L5 nerve root.   05/2011: Dr. Sherwood Gambler ([facility]): DJD and mild disc bu  ? Pericardial effusion   ? Pneumonia   ? ; 11/16/2018- "touch of pneumonia" - seen in ED- 11/14/2018- on antibiotic. Has had it x 2  ? Pneumonia 10/10/2018  ? PVD (peripheral vascular disease) ([place])   ? Right leg stent in  [place].  (No records)  ? Restless legs   ? Rheumatoid arthritis ([place])   ? "knees" "hands", "RA"  ? Sleep apnea   ? does not use Cpap  ? Thoracic ascending aortic aneurysm ([place])   ? 4.4 cm 11/14/18 CTA  ? Type II diabetes mellitus ([place])   ? Uterine leiomyoma 01/10/2021  ? ? ?Past Surgical History:  ?Procedure Laterality Date  ? AMPUTATION Left 12/27/2018  ? Procedure: LEFT THUMB REVISION AMPUTATION DIGIT;  Surgeon: Charlotte Crumb, MD;  Location: [name];  Service: Orthopedics;  Laterality: Left;  ? AMPUTATION Left 01/15/2021  ? Procedure: AMPUTATION OF DIGIT LEFT INDEX FINGER;  Surgeon: Leanora Cover, MD;  Location: [place];  Service: Orthopedics;  Laterality: Left;  ? AV FISTULA PLACEMENT Left   ? AV FISTULA PLACEMENT Right 03/12/2019  ? Procedure: INSERTION OF ARTERIOVENOUS (AV) GORE-TEX GRAFT ARM;  Surgeon: Elam Dutch, MD;  Location: [facility];  Service: Vascular;  Laterality: Right;  ? AV FISTULA PLACEMENT Right 08/13/2019  ? Procedure: RIGHT UPPER ARM ARTERIOVENOUS (AV) FISTULA CREATION WITH BASILIC VEIN;  Surgeon: Elam Dutch, MD;  Location: [place];  Service: Vascular;  Laterality: Right;  ? [place]; 1997  ? x 2  ? COLONOSCOPY    ? EYE SURGERY Bilateral   ? cataract surgery  ? I & D EXTREMITY Left 01/09/2021  ? Procedure: IRRIGATION AND DEBRIDEMENT INDEX FINGER;  Surgeon: Leanora Cover, MD;  Location: [place];  Service: Orthopedics;  Laterality: Left;  ? INSERTION OF DIALYSIS CATHETER Right 09/26/2018  ? Procedure: INSERTION OF DIALYSIS CATHETER Right Internal Jugular.;  Surgeon: Elam Dutch, MD;  Location: [facility] OR;  Service: Vascular;  Laterality: Right;  ? IR DIALY SHUNT INTRO NEEDLE/INTRACATH INITIAL W/IMG RIGHT Right 11/21/2020  ? IR FLUORO GUIDE CV LINE RIGHT  05/04/2019  ? IR US GUIDE VASC ACCESS RIGHT  05/04/2019  ? IR US GUIDE VASC ACCESS RIGHT  11/21/2020  ? LIGATION OF ARTERIOVENOUS  FISTULA Left 09/26/2018  ? Procedure: LIGATION OF ARTERIOVENOUS  FISTULA LEFT ARM;  Surgeon: Elam Dutch, MD;  Location: [place];  Service: Vascular;  Laterality: Left;  ? PERICARDIAL WINDOW  02/2004  ? for pericardial effusion  ? PERIPHERAL VASCULAR INTERVENTION Right 10/26/2019  ? Procedure: PERIPHERAL VA

## 2021-05-26 NOTE — ED Notes (Signed)
Pt's left 5th toe is necrotic and a chunk of the toe is separating from the other part of the toe. Pt has 3+ swelling of the left foot. Left foot is warm to touch. The left posterior tibial pulse was audible by doppler. ?

## 2021-05-26 NOTE — Progress Notes (Signed)
PHARMACY - PHYSICIAN COMMUNICATION ?CRITICAL VALUE ALERT - BLOOD CULTURE IDENTIFICATION (BCID) ? ?[name] is an 55 y.o. female who presented to [facility] on 05/25/2021 with a chief complaint of fever and concern for sepsis. ? ?Assessment:  Patient has a chronic wound on left fifth toe that has experienced bleeding and draining over the last few days. WBC is 11.5 and patient is afebrile. BCID showed Staph. epidermitis in one anaerobic bottle out of 4. Although MecA resistance is present, it is likely a contaminant.  ? ?Name of physician (or Provider) Contacted: Dr. Sloan Leiter ? ?Current antibiotics: vancomycin and cefepime ? ?Changes to prescribed antibiotics recommended:  ?No changes at this time. Deescalate based on clinical progress. ? ?Results for orders placed or performed during the hospital encounter of 05/25/21  ?Blood Culture ID Panel (Reflexed) (Collected: 05/25/2021  9:42 PM)  ?Result Value Ref Range  ? Enterococcus faecalis NOT DETECTED NOT DETECTED  ? Enterococcus Faecium NOT DETECTED NOT DETECTED  ? Listeria monocytogenes NOT DETECTED NOT DETECTED  ? Staphylococcus species DETECTED (A) NOT DETECTED  ? Staphylococcus aureus (BCID) NOT DETECTED NOT DETECTED  ? Staphylococcus epidermidis DETECTED (A) NOT DETECTED  ? Staphylococcus lugdunensis NOT DETECTED NOT DETECTED  ? Streptococcus species NOT DETECTED NOT DETECTED  ? Streptococcus agalactiae NOT DETECTED NOT DETECTED  ? Streptococcus pneumoniae NOT DETECTED NOT DETECTED  ? Streptococcus pyogenes NOT DETECTED NOT DETECTED  ? A.calcoaceticus-baumannii NOT DETECTED NOT DETECTED  ? Bacteroides fragilis NOT DETECTED NOT DETECTED  ? Enterobacterales NOT DETECTED NOT DETECTED  ? Enterobacter cloacae complex NOT DETECTED NOT DETECTED  ? Escherichia coli NOT DETECTED NOT DETECTED  ? Klebsiella aerogenes NOT DETECTED NOT DETECTED  ? Klebsiella oxytoca NOT DETECTED NOT DETECTED  ? Klebsiella pneumoniae NOT DETECTED NOT DETECTED  ? Proteus species NOT  DETECTED NOT DETECTED  ? Salmonella species NOT DETECTED NOT DETECTED  ? Serratia marcescens NOT DETECTED NOT DETECTED  ? Haemophilus influenzae NOT DETECTED NOT DETECTED  ? Neisseria meningitidis NOT DETECTED NOT DETECTED  ? Pseudomonas aeruginosa NOT DETECTED NOT DETECTED  ? Stenotrophomonas maltophilia NOT DETECTED NOT DETECTED  ? Candida albicans NOT DETECTED NOT DETECTED  ? Candida auris NOT DETECTED NOT DETECTED  ? Candida glabrata NOT DETECTED NOT DETECTED  ? Candida krusei NOT DETECTED NOT DETECTED  ? Candida parapsilosis NOT DETECTED NOT DETECTED  ? Candida tropicalis NOT DETECTED NOT DETECTED  ? Cryptococcus neoformans/gattii NOT DETECTED NOT DETECTED  ? Methicillin resistance mecA/C DETECTED (A) NOT DETECTED  ? ? ?Rita Vialpando S Cung Masterson ?05/26/2021  3:09 PM ? ?

## 2021-05-26 NOTE — Progress Notes (Signed)
removed 3333ms net fluid.  gave 1 unit of blood, 25 grams albumin 10 mg midodrine for bp support.  unable to remove more due to hypotension.  pre bp 100/49 post bp 101/49  unable to weight pt no scale on stretch pt unable to stand to shortness of breath.  gave hydrocodone for pain in back and shoulders. 2 bandages to rua avf no bleeding dressing cdi. ?

## 2021-05-26 NOTE — ED Notes (Signed)
I tried to change out the Sa02 cord and I put a Sa02 on both ears and I can't get the Sa02 to pick up. The tech tried also and couldn't get it to pick up. ?

## 2021-05-26 NOTE — Progress Notes (Signed)
?PROGRESS NOTE ? ? ? ?[name]  UUV:[id number] DOB: 01/25/1966 DOA: 05/25/2021 ?PCP: Sandi Mariscal, MD  ? ? 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  She had a chronic wound on her left fifth toe and last 3 days it has been bleeding and draining.  In the emergency room temperature 101.8, tachycardia 140, blood pressure stable.  On 4 L oxygen.  Hemoglobin 7.2.  Lactic acid 2.4.  Left foot x-ray with soft tissue swelling and osteomyelitis fifth phalanx.  Started on antibiotics, orthopedics and nephrology consulted. ? ? ?Assessment & Plan: ?  ?Sepsis present on admission secondary to left foot osteomyelitis, diabetic foot ulcer with osteomyelitis: ?Blood cultures drawn, currently on vancomycin and cefepime. ?Orthopedics consulted, determining level of amputation. ?Vascular surgery consulted, ABI pending, possible angiogram and stenting tomorrow. ? ?Essential hypertension: Blood pressure stable.  Home metoprolol. ? ?Hyperlipidemia: On statin. ? ?Rheumatoid arthritis: Takes Humira at home.  On hold due to acute infection. ? ?Anemia of chronic disease: Hemoglobin 7.2, she will receive EPO with dialysis. ? ?ESRD on hemodialysis: Receive dialysis 4/24.  Still fluid overloaded.  Discussed with nephrology for additional dialysis needed. ? ?Type 2 diabetes, poorly controlled with hyperglycemia: ?On insulin 75/25, 35 units twice daily at home.  NPO.  Keep on reduced dose of insulin and sliding scale insulin. ? ?Morbid obesity: BMI more than 40. ? ? ? ?DVT prophylaxis:   SCDs ? ? ?Code Status: Full code ?Family Communication: None ?Disposition Plan: Status is: Observation ?The patient will require care spanning > 2 midnights and should be moved to  inpatient because: Inpatient surgery planned.  Infected foot. ?  ? ? ?Consultants:  ?Orthopedics ?Nephrology ?Vascular surgery ? ?Procedures:  ?None ? ?Antimicrobials:  ?Vancomycin and cefepime 4/24--- ? ? ?Subjective: ?I examined patient in the emergency room.  Had some shortness of breath.  Denies any other complaints. ? ?Objective: ?Vitals:  ? 05/26/21 1030 05/26/21 1045 05/26/21 1100 05/26/21 1237  ?BP:  110/69 132/87 (!) 100/49  ?Pulse:  81    ?Resp: 14 19 (!) 23 (!) 21  ?Temp:    97.7 ?F (36.5 ?C)  ?TempSrc:      ?SpO2:      ?Weight:      ?Height:      ? ? ?Intake/Output Summary (Last 24 hours) at 05/26/2021 1254 ?Last data filed at 05/26/2021 0041 ?Gross per 24 hour  ?Intake 500 ml  ?Output --  ?Net 500 ml  ? ?Filed Weights  ? 05/25/21 2010  ?Weight: 119.9 kg  ? ? ?Examination: ? ?General exam: Appears calm and comfortable  ?Frail and debilitated.  Chronically sick looking.  Not in any distress. ?Respiratory system: Clear to auscultation. Respiratory effort normal.  No added sounds.  On 4 L oxygen and adequately saturating.  ?Cardiovascular system: S1 & S2 heard, RRR.  ?Gastrointestinal system: Soft.  Nontender.  Bowel sound present. ?Central nervous system: Alert and oriented. No focal neurological deficits. ?Extremities: ?Multiple digit amputations of the hand.  Stable. ?Grossly purulent, chronic ischemic changes, draining ulcer left fifth toe. ? ? ?Data Reviewed: I have personally reviewed following labs and imaging studies ? ?CBC: ?Recent Labs  ?Lab 05/25/21 ?2105 05/26/21 ?4742  ?WBC 11.8* 11.5*  ?HGB 7.2* 7.0*  ?HCT 23.1* 22.6*  ?MCV 98.3 99.6  ?PLT 252 234  ? ?  Basic Metabolic Panel: ?Recent Labs  ?Lab 05/25/21 ?2105 05/26/21 ?1497  ?NA 129* 129*  ?K 4.3 4.0  ?CL 87* 88*  ?CO2 30 25  ?GLUCOSE 400* 308*  ?BUN 27* 34*  ?CREATININE 4.67* 5.00*  ?CALCIUM 8.3* 8.0*  ? ?GFR: ?Estimated Creatinine Clearance: 17.1 mL/min (A) (by C-G formula based on SCr of 5 mg/dL (H)). ?Liver Function Tests: ?Recent Labs  ?Lab  05/25/21 ?2105 05/26/21 ?0263  ?AST 22 15  ?ALT 21 14  ?ALKPHOS 145* 119  ?BILITOT 0.7 0.4  ?PROT 7.7 7.1  ?ALBUMIN 2.5* 2.4*  ? ?No results for input(s): LIPASE, AMYLASE in the last 168 hours. ?No results for input(s): AMMONIA in the last 168 hours. ?Coagulation Profile: ?No results for input(s): INR, PROTIME in the last 168 hours. ?Cardiac Enzymes: ?No results for input(s): CKTOTAL, CKMB, CKMBINDEX, TROPONINI in the last 168 hours. ?BNP (last 3 results) ?No results for input(s): PROBNP in the last 8760 hours. ?HbA1C: ?No results for input(s): HGBA1C in the last 72 hours. ?CBG: ?Recent Labs  ?Lab 05/25/21 ?2351 05/26/21 ?0745  ?GLUCAP 391* 270*  ? ?Lipid Profile: ?No results for input(s): CHOL, HDL, LDLCALC, TRIG, CHOLHDL, LDLDIRECT in the last 72 hours. ?Thyroid Function Tests: ?No results for input(s): TSH, T4TOTAL, FREET4, T3FREE, THYROIDAB in the last 72 hours. ?Anemia Panel: ?No results for input(s): VITAMINB12, FOLATE, FERRITIN, TIBC, IRON, RETICCTPCT in the last 72 hours. ?Sepsis Labs: ?Recent Labs  ?Lab 05/25/21 ?2105 05/26/21 ?0040  ?LATICACIDVEN 2.4* 1.9  ? ? ?Recent Results (from the past 240 hour(s))  ?Blood culture (routine x 2)     Status: None (Preliminary result)  ? Collection Time: 05/25/21  9:20 PM  ? Specimen: BLOOD LEFT HAND  ?Result Value Ref Range Status  ? Specimen Description BLOOD LEFT HAND  Final  ? Special Requests   Final  ?  BOTTLES DRAWN AEROBIC AND ANAEROBIC Blood Culture adequate volume  ? Culture   Final  ?  NO GROWTH < 12 HOURS ?Performed at [facility], [place] [address] [place], [place] [address] ?  ? Report Status PENDING  Incomplete  ?Blood culture (routine x 2)     Status: None (Preliminary result)  ? Collection Time: 05/25/21  9:42 PM  ? Specimen: BLOOD  ?Result Value Ref Range Status  ? Specimen Description BLOOD  Final  ? Special Requests   Final  ?  L EJ BOTTLES DRAWN AEROBIC AND ANAEROBIC Blood Culture adequate volume  ? Culture   Final  ?  NO GROWTH < 12  HOURS ?Performed at [facility], [place] [address], [place],  [address] ?  ? Report Status PENDING  Incomplete  ?  ? ? ? ? ? ?Radiology Studies: ?DG Foot 2 Views Left ? ?Result Date: 05/25/2021 ?CLINICAL DATA:  Osteomyelitis of fifth digit. EXAM: LEFT FOOT - 2 VIEW COMPARISON:  None. FINDINGS: There is soft tissue swelling and air in the region of the fourth and fifth metatarsals, fourth proximal phalanx, and throughout the fifth toe worrisome for infection. There is fragmentation which may represent erosion versus fracture involving the base of the fifth distal phalanx in the distal aspect of the fifth proximal phalanx. This appears nondisplaced. No dislocation. There is likely a healed fracture of the fifth proximal metatarsal. Peripheral vascular calcifications are present. IMPRESSION: 1. Soft tissue swelling and air involving the fourth and fifth metatarsals and phalanges worrisome for infection. 2. Fragmentation secondary to osteomyelitis versus fracture involving the fifth proximal and distal phalanx. Please  correlate clinically. Electronically Signed   By: Ronney Asters M.D.   On: 05/25/2021 20:33  ? ?VAS [place] ABI WITH/WO TBI ? ?Result Date: 05/26/2021 ? LOWER EXTREMITY DOPPLER STUDY Patient Name:  [name]  Date of Exam:   05/26/2021 Medical Rec #: [id number]           Accession #:    [id number] Date of Birth: [birth year]            Patient Gender: F Patient Age:   89 years Exam Location:  [facility] Procedure:      VAS [place] ABI WITH/WO TBI Referring Phys: MARCUS DUDA --------------------------------------------------------------------------------  Indications: PVD w/ gangrene LE left. High Risk         Hypertension, hyperlipidemia, Diabetes, coronary artery Factors:          disease.  Comparison Study: no prior Performing Technologist: Archie Patten RVS  Examination Guidelines: A complete evaluation includes at minimum, Doppler waveform signals and systolic blood pressure reading at the  level of bilateral brachial, anterior tibial, and posterior tibial arteries, when vessel segments are accessible. Bilateral testing is considered an integral part of a complete examination. Photoelectric Plethysmograph

## 2021-05-26 NOTE — Progress Notes (Signed)
ABI has been completed.  ? ?Preliminary results in CV Proc.  ? ?[name] [name] ?05/26/2021 11:54 AM    ?

## 2021-05-26 NOTE — ED Notes (Signed)
Patient transported to vascular. 

## 2021-05-26 NOTE — Consult Note (Addendum)
[facility] Renal Consultation Note    Indication for Consultation:  Management of ESRD/hemodialysis; anemia, hypertension/volume and secondary hyperparathyroidism  PCP:Sun, Charyl Dancer, MD  HPI: [name] is a 56 y.o. female. ESRD on HD MWF at [facility].  Past medical history significant for hypertension, DMT2, HLD, CHF, CAD, Hx pericardial window, renal mass, RA, Hx chronic wounds and osteomyelitis of L thoumb s/p amputation, and HX chronic hypoxic respiratory failure requiring 4L O2 at home.    Patient presents today due to swelling in left foot with bleeding and yellow discharge. States it started bleeding about 3 days ago and she put an ointment on it.  The follow day noticed a malodorous discharge and continued to worsen so she came to the ED.  Denies fever, chills, abdominal pain and n/v/d.  Reports shortening HD last week due to doctor appointments and had 10L on when she went to HD yesterday.  Removed 4.6L and had planned on extra outpatient HD today.  Admits to intermittent SOB, especially with exertion.  No longer using chronic O2 at home during the day, only while sleeping.      Pertinent findings in the ED include temp 101.8, tachycardia, hypotension, WBC 11.5, Hgb 7.0 and xray left foot concerning for osteomyelitis.  Patient admitted for further evaluation and management.    Past Medical History:  Diagnosis Date   Anaphylactic shock, unspecified, initial encounter 09/04/2018   Anemia    Anxiety    CAD (coronary artery disease)    Nonobstructive on CT 2019   CHF (congestive heart failure) (HCC)    Chronic bilateral pleural effusions 10/23/2018   COVID-19 10/2018   COVID-19 virus detected 10/10/2018   COVID-19 virus infection 09/18/2018   ESRD (end stage renal disease) (HCC)    Dialysis [facility]- 3rd st   Gangrene (HCC) 09/18/2018   Headache(784.0)    High cholesterol    History of blood transfusion    Hypertension    Mitral regurgitation    moderate to  severe MR 10/2018 echo   Nerve pain    "they say I have L5 nerve damage; my lumbar"   Neuropathy 01/29/2011   04/2011 MRI L-spine:  L5-S1: Bulge/shallow broad-based protrusion greatest centrally and in the right posterior lateral position. Minimal crowding of the  upper aspect of the S1 nerve root greater on the right. Left lateral disc osteophyte with mild encroachment upon but not  significant compression of the exiting left L5 nerve root.   05/2011: Dr. Newell Coral ([facility]): DJD and mild disc bu   Pericardial effusion    Pneumonia    ; 11/16/2018- "touch of pneumonia" - seen in ED- 11/14/2018- on antibiotic. Has had it x 2   Pneumonia 10/10/2018   PVD (peripheral vascular disease) (HCC)    Right leg stent in [place].  (No records)   Restless legs    Rheumatoid arthritis (HCC)    "knees" "hands", "RA"   Sleep apnea    does not use Cpap   Thoracic ascending aortic aneurysm (HCC)    4.4 cm 11/14/18 CTA   Type II diabetes mellitus (HCC)    Uterine leiomyoma 01/10/2021   Past Surgical History:  Procedure Laterality Date   AMPUTATION Left 12/27/2018   Procedure: LEFT THUMB REVISION AMPUTATION DIGIT;  Surgeon: Dairl Ponder, MD;  Location: [facility] OR;  Service: Orthopedics;  Laterality: Left;   AMPUTATION Left 01/15/2021   Procedure: AMPUTATION OF DIGIT LEFT INDEX FINGER;  Surgeon: Betha Loa, MD;  Location: [facility] OR;  Service: Orthopedics;  Laterality: Left;   AV FISTULA PLACEMENT Left    AV FISTULA PLACEMENT Right 03/12/2019   Procedure: INSERTION OF ARTERIOVENOUS (AV) GORE-TEX GRAFT ARM;  Surgeon: Sherren Kerns, MD;  Location[facility] OR;  Service: Vascular;  Laterality: Right;   AV FISTULA PLACEMENT Right 08/13/2019   Procedure: RIGHT UPPER ARM ARTERIOVENOUS (AV) FISTULA CREATION WITH BASILIC VEIN;  Surgeon: Sherren Kerns, MD;  Location: [facility] OR;  Service: Vascular;  Laterality: Right;   CESAREAN SECTION  1996; 1997   x 2   COLONOSCOPY     EYE SURGERY Bilateral    cataract  surgery   I & D EXTREMITY Left 01/09/2021   Procedure: IRRIGATION AND DEBRIDEMENT INDEX FINGER;  Surgeon: Betha Loa, MD;  Location: [facility] OR;  Service: Orthopedics;  Laterality: Left;   INSERTION OF DIALYSIS CATHETER Right 09/26/2018   Procedure: INSERTION OF DIALYSIS CATHETER Right Internal Jugular.;  Surgeon: Sherren Kerns, MD;  Location: [facility] OR;  Service: Vascular;  Laterality: Right;   IR DIALY SHUNT INTRO NEEDLE/INTRACATH INITIAL W/IMG RIGHT Right 11/21/2020   IR FLUORO GUIDE CV LINE RIGHT  05/04/2019   IR US GUIDE VASC ACCESS RIGHT  05/04/2019   IR US GUIDE VASC ACCESS RIGHT  11/21/2020   LIGATION OF ARTERIOVENOUS  FISTULA Left 09/26/2018   Procedure: LIGATION OF ARTERIOVENOUS  FISTULA LEFT ARM;  Surgeon: Sherren Kerns, MD;  Location: [facility] OR;  Service: Vascular;  Laterality: Left;   PERICARDIAL WINDOW  02/2004   for pericardial effusion   PERIPHERAL VASCULAR INTERVENTION Right 10/26/2019   Procedure: PERIPHERAL VASCULAR INTERVENTION;  Surgeon: Sherren Kerns, MD;  Location: [facility] INVASIVE CV LAB;  Service: Cardiovascular;  Laterality: Right;  arm  AV fistula   TUBAL LIGATION  05/1995   UPPER EXTREMITY VENOGRAPHY N/A 06/22/2019   Procedure: UPPER EXTREMITY VENOGRAPHY - Right Central;  Surgeon: Sherren Kerns, MD;  Location: [facility] INVASIVE CV LAB;  Service: Cardiovascular;  Laterality: N/A;   Family History  Problem Relation Age of Onset   Cancer Brother    Heart disease Father        Died age 63   Diabetes Father    Hyperlipidemia Father    Hypertension Father    Stroke Father    Diabetes Mother    Hypertension Mother    Stroke Mother    Dementia Mother    Diabetes Sister    Diabetes Brother    Hypertension Sister    Hypertension Brother    Social History:  reports that she has been smoking cigarettes. She has a 8.25 pack-year smoking history. She has never used smokeless tobacco. She reports that she does not drink alcohol and does not use drugs. Allergies  Allergen Reactions    Bupropion Itching   Dilaudid [Hydromorphone] Other (See Comments)    Apnea, required intubation   Prior to Admission medications   Medication Sig Start Date End Date Taking? Authorizing Provider  acetaminophen (TYLENOL) 500 MG tablet Take 500-1,000 mg by mouth daily as needed for headache (pain).    [provider]  albuterol (VENTOLIN HFA) 108 (90 Base) MCG/ACT inhaler Inhale 2 puffs into the lungs every 6 (six) hours as needed for wheezing or shortness of breath. 02/22/20   Leroy Sea, MD  Ascorbic Acid (VITAMIN C PO) Take 1 tablet by mouth See admin instructions. At dialysis [facility] and friday    [provider]  AURYXIA 1 GM 210 MG(Fe) tablet Take 420-840 mg by mouth See admin  instructions. 840 mg twice daily with meals, occasionally take 420 mg with a snack 05/29/18   [provider]  b complex-vitamin c-folic acid (NEPHRO-VITE) 0.8 MG TABS tablet Take 1 tablet by mouth every Monday, Wednesday, and Friday with hemodialysis. 04/12/19   [provider]  glucose blood (ONETOUCH VERIO) test strip 1 each by Other route 2 (two) times daily. And lancets 2/day 04/10/20   Romero Belling, MD  HUMIRA PEN 40 MG/0.4ML PNKT Inject 40 mg into the skin every 14 (fourteen) days. 08/22/20   [provider]  hydrOXYzine (ATARAX/VISTARIL) 25 MG tablet Take 25 mg by mouth every morning.    [provider]  Insulin Lispro Prot & Lispro (HUMALOG 75/25 MIX) (75-25) 100 UNIT/ML Kwikpen Inject 35 Units into the skin 2 (two) times daily with a meal. Patient taking differently: Inject 35 Units into the skin in the morning and at bedtime. 05/01/20   Romero Belling, MD  Methoxy PEG-Epoetin Beta (MIRCERA IJ) Dialysis Monday,Wednesday and friday 12/19/20 12/18/21  [provider]  metoprolol succinate (TOPROL-XL) 100 MG 24 hr tablet Take 100 mg by mouth daily.    [provider]  Multiple Vitamins-Minerals (ZINC PO) Take 1 tablet by mouth  every morning.    [provider]  NARCAN 4 MG/0.1ML LIQD nasal spray kit Place 1 spray into the nose as needed (accidental overdose.).  10/10/19   [provider]  OVER THE COUNTER MEDICATION Take 1 capsule by mouth every morning. Omega XL    [provider]  oxyCODONE-acetaminophen (PERCOCET) 10-325 MG tablet Take 1 tablet by mouth 3 (three) times daily as needed for pain. 11/04/20   [provider]  pramipexole (MIRAPEX) 0.5 MG tablet Take 0.5 mg by mouth daily.  05/18/17   [provider]  rosuvastatin (CRESTOR) 40 MG tablet TAKE 1 TABLET BY MOUTH DAILY *PATIENT NEEDS APPOINTMENT* Patient taking differently: Take 40 mg by mouth daily. 12/08/20   Rollene Rotunda, MD   Current Facility-Administered Medications  Medication Dose Route Frequency Provider Last Rate Last Admin   0.9 %  sodium chloride infusion  100 mL Intravenous PRN Penninger, Lillia Abed, PA       0.9 %  sodium chloride infusion  100 mL Intravenous PRN Penninger, Lillia Abed, PA       acetaminophen (TYLENOL) tablet 650 mg  650 mg Oral Q6H PRN Synetta Fail, MD       Or   acetaminophen (TYLENOL) suppository 650 mg  650 mg Rectal Q6H PRN Synetta Fail, MD       alteplase (CATHFLO ACTIVASE) injection 2 mg  2 mg Intracatheter Once PRN Penninger, Lillia Abed, PA       ceFEPIme (MAXIPIME) 1 g in sodium chloride 0.9 % 100 mL IVPB  1 g Intravenous Q24H Synetta Fail, MD       Chlorhexidine Gluconate Cloth 2 % PADS 6 each  6 each Topical Q0600 Penninger, Lillia Abed, PA       heparin injection 1,000 Units  1,000 Units Dialysis PRN Penninger, [place], [place]       [START ON 05/27/2021] heparin injection 8,000 Units  8,000 Units Dialysis PRN Penninger, Lillia Abed, PA       HYDROcodone-acetaminophen (NORCO/VICODIN) 5-325 MG per tablet 1.5 tablet  1.5 tablet Oral Q6H PRN Synetta Fail, MD   1.5 tablet at 05/26/21 0506   insulin aspart (novoLOG) injection 0-5 Units  0-5 Units Subcutaneous QHS Synetta Fail, MD   5 Units at 05/26/21 0026   insulin  aspart (novoLOG) injection 0-6 Units  0-6 Units Subcutaneous TID WC Synetta Fail, MD   3 Units at 05/26/21 0802   insulin aspart protamine- aspart (NOVOLOG MIX 70/30) injection 20 Units  20 Units Subcutaneous BID WC Synetta Fail, MD   20 Units at 05/26/21 0040   lidocaine (PF) (XYLOCAINE) 1 % injection 5 mL  5 mL Intradermal PRN Penninger, Lillia Abed, PA       lidocaine-prilocaine (EMLA) cream 1 application.  1 application. Topical PRN Penninger, Lillia Abed, [place]       metoprolol succinate (TOPROL-XL) 24 hr tablet 100 mg  100 mg Oral Daily Synetta Fail, MD       pentafluoroprop-tetrafluoroeth (GEBAUERS) aerosol 1 application.  1 application. Topical PRN Penninger, Lillia Abed, PA       polyethylene glycol (MIRALAX / GLYCOLAX) packet 17 g  17 g Oral Daily PRN Synetta Fail, MD       rosuvastatin (CRESTOR) tablet 40 mg  40 mg Oral Daily Synetta Fail, MD   40 mg at 05/26/21 1610   sodium chloride flush (NS) 0.9 % injection 3 mL  3 mL Intravenous Q12H Synetta Fail, MD       Current Outpatient Medications  Medication Sig Dispense Refill   acetaminophen (TYLENOL) 500 MG tablet Take 500-1,000 mg by mouth daily as needed for headache (pain).     albuterol (VENTOLIN HFA) 108 (90 Base) MCG/ACT inhaler Inhale 2 puffs into the lungs every 6 (six) hours as needed for wheezing or shortness of breath. 6.7 g 0   Ascorbic Acid (VITAMIN C PO) Take 1 tablet by mouth See admin instructions. At dialysis Monday,Wednesday and friday     AURYXIA 1 GM 210 MG(Fe) tablet Take 420-840 mg by mouth See admin instructions. 840 mg twice daily with meals, occasionally take 420 mg with a snack     b complex-vitamin c-folic acid (NEPHRO-VITE) 0.8 MG TABS tablet Take 1 tablet by mouth every Monday, Wednesday, and Friday with hemodialysis.     glucose blood (ONETOUCH VERIO) test strip 1 each by Other route 2 (two) times daily. And lancets 2/day 200 each  3   HUMIRA PEN 40 MG/0.4ML PNKT Inject 40 mg into the skin every 14 (fourteen) days.     hydrOXYzine (ATARAX/VISTARIL) 25 MG tablet Take 25 mg by mouth every morning.     Insulin Lispro Prot & Lispro (HUMALOG 75/25 MIX) (75-25) 100 UNIT/ML Kwikpen Inject 35 Units into the skin 2 (two) times daily with a meal. (Patient taking differently: Inject 35 Units into the skin in the morning and at bedtime.) 45 mL 3   Methoxy PEG-Epoetin Beta (MIRCERA IJ) Dialysis Monday,Wednesday and friday     metoprolol succinate (TOPROL-XL) 100 MG 24 hr tablet Take 100 mg by mouth daily.     Multiple Vitamins-Minerals (ZINC PO) Take 1 tablet by mouth every morning.     NARCAN 4 MG/0.1ML LIQD nasal spray kit Place 1 spray into the nose as needed (accidental overdose.).      OVER THE COUNTER MEDICATION Take 1 capsule by mouth every morning. Omega XL     oxyCODONE-acetaminophen (PERCOCET) 10-325 MG tablet Take 1 tablet by mouth 3 (three) times daily as needed for pain.     pramipexole (MIRAPEX) 0.5 MG tablet Take 0.5 mg by mouth daily.   1   rosuvastatin (CRESTOR) 40 MG tablet TAKE 1 TABLET BY MOUTH DAILY *PATIENT NEEDS APPOINTMENT* (Patient taking differently: Take 40 mg by mouth daily.) 30 tablet 10  Labs: Basic Metabolic Panel: Recent Labs  Lab 05/25/21 2105 05/26/21 0513  NA 129* 129*  K 4.3 4.0  CL 87* 88*  CO2 30 25  GLUCOSE 400* 308*  BUN 27* 34*  CREATININE 4.67* 5.00*  CALCIUM 8.3* 8.0*   Liver Function Tests: Recent Labs  Lab 05/25/21 2105 05/26/21 0513  AST 22 15  ALT 21 14  ALKPHOS 145* 119  BILITOT 0.7 0.4  PROT 7.7 7.1  ALBUMIN 2.5* 2.4*   CBC: Recent Labs  Lab 05/25/21 2105 05/26/21 0513  WBC 11.8* 11.5*  HGB 7.2* 7.0*  HCT 23.1* 22.6*  MCV 98.3 99.6  PLT 252 234   Cardiac Enzymes: No results for input(s): CKTOTAL, CKMB, CKMBINDEX, TROPONINI in the last 168 hours. CBG: Recent Labs  Lab 05/25/21 2351 05/26/21 0745  GLUCAP 391* 270*   Iron Studies: No results for  input(s): IRON, TIBC, TRANSFERRIN, FERRITIN in the last 72 hours. Studies/Results: DG Foot 2 Views Left  Result Date: 05/25/2021 CLINICAL DATA:  Osteomyelitis of fifth digit. EXAM: LEFT FOOT - 2 VIEW COMPARISON:  None. FINDINGS: There is soft tissue swelling and air in the region of the fourth and fifth metatarsals, fourth proximal phalanx, and throughout the fifth toe worrisome for infection. There is fragmentation which may represent erosion versus fracture involving the base of the fifth distal phalanx in the distal aspect of the fifth proximal phalanx. This appears nondisplaced. No dislocation. There is likely a healed fracture of the fifth proximal metatarsal. Peripheral vascular calcifications are present. IMPRESSION: 1. Soft tissue swelling and air involving the fourth and fifth metatarsals and phalanges worrisome for infection. 2. Fragmentation secondary to osteomyelitis versus fracture involving the fifth proximal and distal phalanx. Please correlate clinically. Electronically Signed   By: Darliss Cheney M.D.   On: 05/25/2021 20:33   VAS [place] ABI WITH/WO TBI  Result Date: 05/26/2021  LOWER EXTREMITY DOPPLER STUDY Patient Name:  [name]  Date of Exam:   05/26/2021 Medical Rec #: [id number]           Accession #:    [id number] Date of Birth: [birth year]            Patient Gender: F Patient Age:   59 years Exam Location:  [facility] Procedure:      VAS [place] ABI WITH/WO TBI Referring Phys: MARCUS DUDA --------------------------------------------------------------------------------  Indications: PVD w/ gangrene LE left. High Risk         Hypertension, hyperlipidemia, Diabetes, coronary artery Factors:          disease.  Comparison Study: no prior Performing Technologist: [place] Ponder RVS  Examination Guidelines: A complete evaluation includes at minimum, Doppler waveform signals and systolic blood pressure reading at the level of bilateral brachial, anterior tibial, and posterior tibial  arteries, when vessel segments are accessible. Bilateral testing is considered an integral part of a complete examination. Photoelectric Plethysmograph (PPG) waveforms and toe systolic pressure readings are included as required and additional duplex testing as needed. Limited examinations for reoccurring indications may be performed as noted.  ABI Findings: +---------+------------------+-----+----------+--------------------------------+ Right    Rt Pressure (mmHg)IndexWaveform  Comment                          +---------+------------------+-----+----------+--------------------------------+ Brachial                                  fistula                          +---------+------------------+-----+----------+--------------------------------+  PTA      79                0.70 monophasic                                 +---------+------------------+-----+----------+--------------------------------+ DP       255               2.26 monophasic                                 +---------+------------------+-----+----------+--------------------------------+ Great Toe                                 unable to obtain pressure due to                                           decreased toe amplitude          +---------+------------------+-----+----------+--------------------------------+ +---------+------------------+-----+-------------------+-------+ Left     Lt Pressure (mmHg)IndexWaveform           Comment +---------+------------------+-----+-------------------+-------+ Brachial 113                    triphasic                  +---------+------------------+-----+-------------------+-------+ PTA      92                0.81 monophasic                 +---------+------------------+-----+-------------------+-------+ DP       91                0.81 dampened monophasic        +---------+------------------+-----+-------------------+-------+ Great Toe26                 0.23 Abnormal                   +---------+------------------+-----+-------------------+-------+ +-------+-----------+-----------+------------+------------+ ABI/TBIToday's ABIToday's TBIPrevious ABIPrevious TBI +-------+-----------+-----------+------------+------------+ Right  2.25                                           +-------+-----------+-----------+------------+------------+ Left   0.81       023                                 +-------+-----------+-----------+------------+------------+  Summary: Right: Resting right ankle-brachial index indicates noncompressible right lower extremity arteries. Left: Resting left ankle-brachial index indicates mild left lower extremity arterial disease. The left toe-brachial index is abnormal. *See table(s) above for measurements and observations.     Preliminary     ROS: All others negative except those listed in HPI.  Physical Exam: Vitals:   05/26/21 1015 05/26/21 1030 05/26/21 1045 05/26/21 1100  BP:   110/69 132/87  Pulse:   81   Resp: 17 14 19  (!) 23  Temp:      TempSrc:      SpO2:      Weight:      Height:  General: WDWN female in NAD Head: NCAT sclera not icteric MMM Neck: Supple. No lymphadenopathy Lungs: CTA bilaterally. No wheeze, rales or rhonchi. Breathing is unlabored. Heart: RRR. No murmur, rubs or gallops.  Abdomen: soft, nontender, +BS, no guarding, no rebound tenderness M/S:  Equal strength b/l in upper and lower extremities.  Lower extremities:no edema, ischemic changes, or open wounds  Neuro: AAOx3. Moves all extremities spontaneously. Psych:  Responds to questions appropriately with a normal affect. Dialysis Access:  RU AVF +b/t  Dialysis Orders:  MWF  - GOC  4hrs, BFR 450, DFR 800,  EDW 110.6kg, 2K/ 2.5Ca  Access: RU AVF  Heparin 96045 unit bolus No ESA 2/2 malignancy Calcitriol 2.5mg  qHD Parsabiv 7.5mg  qHD   Assessment/Plan:  Gangrenous LLE 5th digit/Osteomyelitis/chronic wounds on BLE  in setting of PVD - ortho and vascular consulting.  Plan for angiogram with possible intervention tomorrow per vascular.  If circulation improved may be able to save half of her foot otherwise will require BKA.  Volume overload - Completed HD yesterday but left 5kg over her dry weight.  History of large gains.  Extra HD today maximizing UF and then HD again tomorrow.  ESRD -  on HD MWF.  Extra HD today due to volume overload.  Then resume regular schedule tomorrow around vascular procedure.   Hypertension - BP soft.  Hold home meds for now. Midodrine and albumin with HD for BP support.   Anemia of CKD - Hgb 7.0.  No ESA d/t malignancy.  Will order 1 unit pRBC to be transfused with HD today and potentially another 1 unit w/HD tomorrow.   Secondary Hyperparathyroidism -  CCa in goal. Check phos.  Continue VDRA on MWF.  Unable to give parsabiv not on formulary.  Continue binders.  Nutrition - Renal diet w/fluid restrictions.  DMT2 - per PMD RA -humira on hold Chronic pain - per PMD  Virgina Norfolk, PA-C [facility] 05/26/2021, 12:00 PM  Nephrology attending: I have personally seen and examined patient.  Chart reviewed.  I agree with consult note including assessment and plan as outlined above.  56 year old female with history of poorly controlled diabetes, HLD, CHF, CAD, ESRD on HD who presents with worsening left foot pain and worsening wounds.  It is consistent with left foot osteomyelitis/diabetic foot ulcer.  Currently on empiric antibiotics including vancomycin and cefepime.  Seen by orthopedics and vascular surgeon.  Plan for ABI and then possible surgical intervention. The patient looks fluid overloaded and complaining of shortness of breath.  Reportedly, this her dry weight and unable to meet her goal at outpatient.  We will plan for extra dialysis today for ultrafiltration and regular scheduled HD tomorrow. Blood transfusion for anemia, as patient is going for procedure  tomorrow.  Continue current management.  We will continue to follow with you. Eddie North, MD [facility].

## 2021-05-26 NOTE — ED Notes (Signed)
Patient transported to dialysis

## 2021-05-26 NOTE — Consult Note (Signed)
? ? ?ORTHOPAEDIC CONSULTATION ? ?REQUESTING PHYSICIAN: Barb Merino, MD ? ?Chief Complaint: Swelling both lower extremities with ulceration and gangrene left little toe. ? ?HPI: ?[name] is a 56 y.o. female who presents with gangrene left little toe and chronic venous insufficiency ulcerations both lower extremities.  Patient has active ulcers on the left side.  Patient has a history of uncontrolled type 2 diabetes with peripheral vascular disease and on dialysis. ? ?Past Medical History:  ?Diagnosis Date  ? Anaphylactic shock, unspecified, initial encounter 09/04/2018  ? Anemia   ? Anxiety   ? CAD (coronary artery disease)   ? Nonobstructive on CT 2019  ? CHF (congestive heart failure) ([place])   ? Chronic bilateral pleural effusions 10/23/2018  ? COVID-19 10/2018  ? COVID-19 virus detected 10/10/2018  ? COVID-19 virus infection 09/18/2018  ? ESRD (end stage renal disease) ([place])   ? Dialysis [facility]- [address]  ? Gangrene ([place]) 09/18/2018  ? Headache(784.0)   ? High cholesterol   ? History of blood transfusion   ? Hypertension   ? Mitral regurgitation   ? moderate to severe MR 10/2018 echo  ? Nerve pain   ? "they say I have L5 nerve damage; my lumbar"  ? Neuropathy 01/29/2011  ? 04/2011 MRI L-spine:  L5-S1: Bulge/shallow broad-based protrusion greatest centrally and in the right posterior lateral position. Minimal crowding of the  upper aspect of the S1 nerve root greater on the right. Left lateral disc osteophyte with mild encroachment upon but not  significant compression of the exiting left L5 nerve root.   05/2011: Dr. Sherwood Gambler ([facility]): DJD and mild disc bu  ? Pericardial effusion   ? Pneumonia   ? ; 11/16/2018- "touch of pneumonia" - seen in ED- 11/14/2018- on antibiotic. Has had it x 2  ? Pneumonia 10/10/2018  ? PVD (peripheral vascular disease) ([place])   ? Right leg stent in [place].  (No records)  ? Restless legs   ? Rheumatoid arthritis ([place])   ? "knees" "hands", "RA"  ? Sleep apnea   ?  does not use Cpap  ? Thoracic ascending aortic aneurysm ([place])   ? 4.4 cm 11/14/18 CTA  ? Type II diabetes mellitus ([place])   ? Uterine leiomyoma 01/10/2021  ? ?Past Surgical History:  ?Procedure Laterality Date  ? AMPUTATION Left 12/27/2018  ? Procedure: LEFT THUMB REVISION AMPUTATION DIGIT;  Surgeon: Charlotte Crumb, MD;  Location: [place];  Service: Orthopedics;  Laterality: Left;  ? AMPUTATION Left 01/15/2021  ? Procedure: AMPUTATION OF DIGIT LEFT INDEX FINGER;  Surgeon: Leanora Cover, MD;  Location: [place];  Service: Orthopedics;  Laterality: Left;  ? AV FISTULA PLACEMENT Left   ? AV FISTULA PLACEMENT Right 03/12/2019  ? Procedure: INSERTION OF ARTERIOVENOUS (AV) GORE-TEX GRAFT ARM;  Surgeon: Elam Dutch, MD;  Location: [facility] OR;  Service: Vascular;  Laterality: Right;  ? AV FISTULA PLACEMENT Right 08/13/2019  ? Procedure: RIGHT UPPER ARM ARTERIOVENOUS (AV) FISTULA CREATION WITH BASILIC VEIN;  Surgeon: Elam Dutch, MD;  Location: [place];  Service: Vascular;  Laterality: Right;  ? [place]; 1997  ? x 2  ? COLONOSCOPY    ? EYE SURGERY Bilateral   ? cataract surgery  ? I & D EXTREMITY Left 01/09/2021  ? Procedure: IRRIGATION AND DEBRIDEMENT INDEX FINGER;  Surgeon: Leanora Cover, MD;  Location: [place];  Service: Orthopedics;  Laterality: Left;  ? INSERTION OF DIALYSIS CATHETER Right 09/26/2018  ? Procedure: INSERTION OF DIALYSIS  CATHETER Right Internal Jugular.;  Surgeon: Elam Dutch, MD;  Location: [facility] OR;  Service: Vascular;  Laterality: Right;  ? IR DIALY SHUNT INTRO NEEDLE/INTRACATH INITIAL W/IMG RIGHT Right 11/21/2020  ? IR FLUORO GUIDE CV LINE RIGHT  05/04/2019  ? IR US GUIDE VASC ACCESS RIGHT  05/04/2019  ? IR US GUIDE VASC ACCESS RIGHT  11/21/2020  ? LIGATION OF ARTERIOVENOUS  FISTULA Left 09/26/2018  ? Procedure: LIGATION OF ARTERIOVENOUS  FISTULA LEFT ARM;  Surgeon: Elam Dutch, MD;  Location: [place];  Service: Vascular;  Laterality: Left;  ? PERICARDIAL WINDOW  02/2004  ? for pericardial  effusion  ? PERIPHERAL VASCULAR INTERVENTION Right 10/26/2019  ? Procedure: PERIPHERAL VASCULAR INTERVENTION;  Surgeon: Elam Dutch, MD;  Location: [place] [facility];  Service: Cardiovascular;  Laterality: Right;  arm  AV fistula  ? TUBAL LIGATION  05/1995  ? UPPER EXTREMITY VENOGRAPHY N/A 06/22/2019  ? Procedure: UPPER EXTREMITY VENOGRAPHY - Right [place];  Surgeon: Elam Dutch, MD;  Location: [place] [facility];  Service: Cardiovascular;  Laterality: N/A;  ? ?Social History  ? ?Socioeconomic History  ? Marital status: Married  ?  Spouse name: Not on file  ? Number of children: Not on file  ? Years of education: Not on file  ? Highest education level: Not on file  ?Occupational History  ? Not on file  ?Tobacco Use  ? Smoking status: Some Days  ?  Packs/day: 0.25  ?  Years: 33.00  ?  Pack years: 8.25  ?  Types: Cigarettes  ?  Last attempt to quit: 07/17/2019  ?  Years since quitting: 1.8  ? Smokeless tobacco: Never  ? Tobacco comments:  ?  She started back 2021 and started back 6 month later.  ?Vaping Use  ? Vaping Use: Never used  ?Substance and Sexual Activity  ? Alcohol use: No  ? Drug use: No  ? Sexual activity: Not Currently  ?  Birth control/protection: Post-menopausal  ?Other Topics Concern  ? Not on file  ?Social History Narrative  ? Lives with son.    ?   ? ?Social Determinants of Health  ? ?Financial Resource Strain: Not on file  ?Food Insecurity: Not on file  ?Transportation Needs: Not on file  ?Physical Activity: Not on file  ?Stress: Not on file  ?Social Connections: Not on file  ? ?Family History  ?Problem Relation Age of Onset  ? Cancer Brother   ? Heart disease Father   ?     Died age 69  ? Diabetes Father   ? Hyperlipidemia Father   ? Hypertension Father   ? Stroke Father   ? Diabetes Mother   ? Hypertension Mother   ? Stroke Mother   ? Dementia Mother   ? Diabetes Sister   ? Diabetes Brother   ? Hypertension Sister   ? Hypertension Brother   ? ?- negative except otherwise stated in the  family history section ?Allergies  ?Allergen Reactions  ? Bupropion Itching  ? Dilaudid [Hydromorphone] Other (See Comments)  ?  Apnea, required intubation  ? ?Prior to Admission medications   ?Medication Sig Start Date End Date Taking? Authorizing Provider  ?acetaminophen (TYLENOL) 500 MG tablet Take 500-1,000 mg by mouth daily as needed for headache (pain).    [provider]  ?albuterol (VENTOLIN HFA) 108 (90 Base) MCG/ACT inhaler Inhale 2 puffs into the lungs every 6 (six) hours as needed for wheezing or shortness of breath. 02/22/20  Thurnell Lose, MD  ?Ascorbic Acid (VITAMIN C PO) Take 1 tablet by mouth See admin instructions. At dialysis [facility] and friday    [provider]  ?AURYXIA 1 GM 210 MG(Fe) tablet Take 420-840 mg by mouth See admin instructions. Take  4 tablets (840 mg) by mouth twice daily with meals, occasionally take 2 tablets ('420mg'$ ) with a snack 05/29/18   [provider]  ?b complex-vitamin c-folic acid (NEPHRO-VITE) 0.8 MG TABS tablet Take 1 tablet by mouth every Monday, Wednesday, and Friday with hemodialysis. 04/12/19   [provider]  ?glucose blood (ONETOUCH VERIO) test strip 1 each by Other route 2 (two) times daily. And lancets 2/day 04/10/20   Renato Shin, MD  ?HUMIRA PEN 40 MG/0.4ML PNKT Inject 40 mg into the skin every 14 (fourteen) days. 08/22/20   [provider]  ?hydrOXYzine (ATARAX/VISTARIL) 25 MG tablet Take 25 mg by mouth every morning.    [provider]  ?Insulin Lispro Prot & Lispro (HUMALOG 75/25 MIX) (75-25) 100 UNIT/ML Kwikpen Inject 35 Units into the skin 2 (two) times daily with a meal. ?Patient taking differently: Inject 35 Units into the skin in the morning and at bedtime. 05/01/20   Renato Shin, MD  ?Methoxy PEG-Epoetin Beta (MIRCERA IJ) Dialysis Monday,Wednesday and friday 12/19/20 12/18/21  [provider]  ?metoprolol succinate (TOPROL-XL) 100 MG 24 hr tablet Take 100 mg by mouth daily.  Take with or immediately following a meal.    [provider]  ?Multiple Vitamins-Minerals (ZINC PO) Take 1 tablet by mouth every morning.    [provider]  ?NARCAN 4 MG/0.1ML LIQD nasal

## 2021-05-27 ENCOUNTER — Encounter (HOSPITAL_COMMUNITY)
Admission: EM | Disposition: A | Discharge status: Skilled Nursing Facility | Payer: Self-pay | Source: Home / Self Care | Attending: Internal Medicine

## 2021-05-27 DIAGNOSIS — I70262 Atherosclerosis of native arteries of extremities with gangrene, left leg: Secondary | ICD-10-CM | POA: Diagnosis not present

## 2021-05-27 DIAGNOSIS — M86272 Subacute osteomyelitis, left ankle and foot: Secondary | ICD-10-CM | POA: Diagnosis not present

## 2021-05-27 DIAGNOSIS — N186 End stage renal disease: Secondary | ICD-10-CM | POA: Diagnosis not present

## 2021-05-27 DIAGNOSIS — Z992 Dependence on renal dialysis: Secondary | ICD-10-CM | POA: Diagnosis not present

## 2021-05-27 DIAGNOSIS — D631 Anemia in chronic kidney disease: Secondary | ICD-10-CM | POA: Diagnosis not present

## 2021-05-27 HISTORY — PX: ABDOMINAL AORTOGRAM W/LOWER EXTREMITY: CATH118223

## 2021-05-27 HISTORY — PX: PERIPHERAL VASCULAR ATHERECTOMY: CATH118256

## 2021-05-27 LAB — CBC
HCT: 25.5 % — ABNORMAL LOW (ref 36.0–46.0)
Hemoglobin: 7.9 g/dL — ABNORMAL LOW (ref 12.0–15.0)
MCH: 29.7 pg (ref 26.0–34.0)
MCHC: 31 g/dL (ref 30.0–36.0)
MCV: 95.9 fL (ref 80.0–100.0)
Platelets: 254 10*3/uL (ref 150–400)
RBC: 2.66 MIL/uL — ABNORMAL LOW (ref 3.87–5.11)
RDW: 15.8 % — ABNORMAL HIGH (ref 11.5–15.5)
WBC: 13.8 10*3/uL — ABNORMAL HIGH (ref 4.0–10.5)
nRBC: 0 % (ref 0.0–0.2)

## 2021-05-27 LAB — GLUCOSE, CAPILLARY
Glucose-Capillary: 289 mg/dL — ABNORMAL HIGH (ref 70–99)
Glucose-Capillary: 283 mg/dL — ABNORMAL HIGH (ref 70–99)
Glucose-Capillary: 241 mg/dL — ABNORMAL HIGH (ref 70–99)
Glucose-Capillary: 273 mg/dL — ABNORMAL HIGH (ref 70–99)

## 2021-05-27 SURGERY — ABDOMINAL AORTOGRAM W/LOWER EXTREMITY
Anesthesia: LOCAL

## 2021-05-27 MED ORDER — SODIUM CHLORIDE 0.9% FLUSH
3.0000 mL | Freq: Two times a day (BID) | INTRAVENOUS | Status: DC
  Administered 2021-05-29 – 2021-06-10 (×17): 3 mL via INTRAVENOUS

## 2021-05-27 MED ORDER — LIDOCAINE HCL (PF) 1 % IJ SOLN
INTRAMUSCULAR | Status: AC
  Filled 2021-05-27: qty 30

## 2021-05-27 MED ORDER — FENTANYL CITRATE (PF) 100 MCG/2ML IJ SOLN
INTRAMUSCULAR | Status: AC
  Filled 2021-05-27: qty 2

## 2021-05-27 MED ORDER — ACETAMINOPHEN 325 MG PO TABS
650.0000 mg | ORAL_TABLET | ORAL | Status: DC | PRN
  Administered 2021-06-04 – 2021-06-09 (×12): 650 mg via ORAL
  Filled 2021-05-27 (×7): qty 2

## 2021-05-27 MED ORDER — HEPARIN SODIUM (PORCINE) 1000 UNIT/ML IJ SOLN
INTRAMUSCULAR | Status: AC
  Filled 2021-05-27: qty 10

## 2021-05-27 MED ORDER — IODIXANOL 320 MG/ML IV SOLN
INTRAVENOUS | Status: DC | PRN
Start: 2021-05-27 — End: 2021-05-27
  Administered 2021-05-27: 170 mL

## 2021-05-27 MED ORDER — VANCOMYCIN HCL IN DEXTROSE 1-5 GM/200ML-% IV SOLN
1000.0000 mg | INTRAVENOUS | Status: DC
  Administered 2021-05-27 – 2021-05-29 (×2): 1000 mg via INTRAVENOUS
  Filled 2021-05-27 (×3): qty 200

## 2021-05-27 MED ORDER — HEPARIN (PORCINE) IN NACL 1000-0.9 UT/500ML-% IV SOLN
INTRAVENOUS | Status: AC
  Filled 2021-05-27: qty 1000

## 2021-05-27 MED ORDER — ONDANSETRON HCL 4 MG/2ML IJ SOLN
4.0000 mg | Freq: Four times a day (QID) | INTRAMUSCULAR | Status: DC | PRN
  Administered 2021-05-31: 4 mg via INTRAVENOUS
  Filled 2021-05-27: qty 2

## 2021-05-27 MED ORDER — LABETALOL HCL 5 MG/ML IV SOLN: 10.0000 mg | INTRAVENOUS | Status: DC | PRN

## 2021-05-27 MED ORDER — HEPARIN SODIUM (PORCINE) 1000 UNIT/ML IJ SOLN
INTRAMUSCULAR | Status: DC | PRN
  Administered 2021-05-27: 5000 [IU] via INTRAVENOUS
  Administered 2021-05-27: 10000 [IU] via INTRAVENOUS

## 2021-05-27 MED ORDER — LIDOCAINE HCL (PF) 1 % IJ SOLN
INTRAMUSCULAR | Status: DC | PRN
  Administered 2021-05-27: 12 mL

## 2021-05-27 MED ORDER — SODIUM CHLORIDE 0.9% FLUSH: 3.0000 mL | INTRAVENOUS | Status: DC | PRN

## 2021-05-27 MED ORDER — FENTANYL CITRATE (PF) 100 MCG/2ML IJ SOLN
INTRAMUSCULAR | Status: DC | PRN
  Administered 2021-05-27: 50 ug via INTRAVENOUS

## 2021-05-27 MED ORDER — SODIUM CHLORIDE 0.9 % WEIGHT BASED INFUSION: 1.0000 mL/kg/h | INTRAVENOUS | Status: DC

## 2021-05-27 MED ORDER — HYDRALAZINE HCL 20 MG/ML IJ SOLN: 5.0000 mg | INTRAMUSCULAR | Status: DC | PRN

## 2021-05-27 MED ORDER — HEPARIN (PORCINE) IN NACL 1000-0.9 UT/500ML-% IV SOLN
INTRAVENOUS | Status: DC | PRN
  Administered 2021-05-27 (×2): 500 mL

## 2021-05-27 MED ORDER — SODIUM CHLORIDE 0.9 % IV SOLN: 250.0000 mL | INTRAVENOUS | Status: DC | PRN

## 2021-05-27 MED ORDER — CLOPIDOGREL BISULFATE 75 MG PO TABS
75.0000 mg | ORAL_TABLET | Freq: Every day | ORAL | Status: DC
  Administered 2021-05-28 – 2021-06-11 (×14): 75 mg via ORAL
  Filled 2021-05-27 (×14): qty 1

## 2021-05-27 SURGICAL SUPPLY — 23 items
BALLN JADE .014 2.5  X 240 (BALLOONS) ×3
BALLN JADE .014 2.5 X 240 (BALLOONS) ×2
BALLOON JADE .014 2.5 X 240 (BALLOONS) IMPLANT
CATH AURYON ATHERECTOMY 0.9 (CATHETERS) ×1 IMPLANT
CATH OMNI FLUSH 5F 65CM (CATHETERS) ×1 IMPLANT
CATH QUICKCROSS .018X135CM (MICROCATHETER) ×1 IMPLANT
CATH QUICKCROSS .035X135CM (MICROCATHETER) ×1 IMPLANT
DEVICE CLOSURE MYNXGRIP 6/7F (Vascular Products) ×1 IMPLANT
GLIDEWIRE ADV .035X260CM (WIRE) ×1 IMPLANT
KIT ENCORE 26 ADVANTAGE (KITS) ×1 IMPLANT
KIT MICROPUNCTURE NIT STIFF (SHEATH) ×1 IMPLANT
KIT PV (KITS) ×4 IMPLANT
MAT PREVALON FULL STRYKER (MISCELLANEOUS) ×1 IMPLANT
SHEATH PINNACLE 5F 10CM (SHEATH) ×1 IMPLANT
SHEATH PINNACLE 6F 10CM (SHEATH) ×1 IMPLANT
SHEATH PINNACLE ST 6F 65CM (SHEATH) ×1 IMPLANT
SHEATH PROBE COVER 6X72 (BAG) ×1 IMPLANT
SYR MEDRAD MARK V 150ML (SYRINGE) ×1 IMPLANT
TRANSDUCER W/STOPCOCK (MISCELLANEOUS) ×4 IMPLANT
TRAY PV CATH (CUSTOM PROCEDURE TRAY) ×4 IMPLANT
WIRE BENTSON .035X145CM (WIRE) ×1 IMPLANT
WIRE G V18X300CM (WIRE) ×1 IMPLANT
WIRE SPARTACORE .014X300CM (WIRE) ×1 IMPLANT

## 2021-05-27 NOTE — Progress Notes (Signed)
Pharmacy Antibiotic Note ? ?[name] is a 56 y.o. female admitted on 05/25/2021 with  wound infection .  Pharmacy has been consulted for cefepime and vancomycin dosing. ? ?On schedule HD MWF, currently in HD, vanc loading dose given in ED 4/24 ? ?Plan: ?Vancomycin 1g IV q HD ?Continue cefepime 1g IV q 24h ?Monitor Cx results, VVS and ortho intervention plan to narrow ?Vancomycin random level as needed ? ?Height: '5\' 8"'$  (172.7 cm) ?Weight: 119 kg (262 lb 5.6 oz) ?IBW/kg (Calculated) : 63.9 ? ?Temp (24hrs), Avg:97.7 ?F (36.5 ?C), Min:97.3 ?F (36.3 ?C), Max:98.7 ?F (37.1 ?C) ? ?Recent Labs  ?Lab 05/25/21 ?2105 05/26/21 ?0040 05/26/21 ?3818 05/26/21 ?1500 05/26/21 ?2148  ?WBC 11.8*  --  11.5*  --  12.9*  ?CREATININE 4.67*  --  5.00* 4.99* 5.85*  ?LATICACIDVEN 2.4* 1.9  --   --   --   ?  ?Estimated Creatinine Clearance: 14.6 mL/min (A) (by C-G formula based on SCr of 5.85 mg/dL (H)).   ? ?Allergies  ?Allergen Reactions  ? Bupropion Itching  ? Dilaudid [Hydromorphone] Other (See Comments)  ?  Apnea, required intubation  ? ? ?Antimicrobials this admission: ?Cefepime 4/24 >>  ?Vancomycin 4/24 >>  ? ?Dose adjustments this admission: ? ? ?Microbiology results: ?4/24 BCx: 2/4 staph epi ? ?Bertis Ruddy, PharmD ?Clinical Pharmacist ?ED Pharmacist Phone # [phone] ?05/27/2021 8:53 AM ? ? ? ?

## 2021-05-27 NOTE — Progress Notes (Signed)
?PROGRESS NOTE ? ? ? ?[name]  IPJ:[id number] DOB: Oct 06, 1965 DOA: 05/25/2021 ?PCP: Sandi Mariscal, MD  ? ? 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  She had a chronic wound on her left fifth toe and last 3 days it has been bleeding and draining.  In the emergency room temperature 101.8, tachycardia 140, blood pressure stable.  On 4 L oxygen.  Hemoglobin 7.2.  Lactic acid 2.4.  Left foot x-ray with soft tissue swelling and osteomyelitis fifth phalanx.  Started on antibiotics, orthopedics and nephrology consulted. ? ? ?Assessment & Plan: ?  ?Sepsis present on admission secondary to left foot osteomyelitis, diabetic foot ulcer with osteomyelitis: ?1 out of 4 blood cultures positive for Staph epidermidis that is MRSA.  Currently remains on vancomycin and cefepime. ?Orthopedics consulted, determining level of amputation. ?Scheduled for aortogram and possible intervention today with vascular surgery. ? ?Essential hypertension: Blood pressure stable.  Home metoprolol. ? ?Hyperlipidemia: On statin. ? ?Rheumatoid arthritis: Takes Humira at home.  On hold due to acute infection. ? ?Anemia of chronic disease: Received 1 unit of PRBC with dialysis 4/25.  Hemoglobin is 7.9. ? ?ESRD on hemodialysis: Receive dialysis 4/24 as outpatient.  ?Optimizing with subsequent hemodialysis 4/25, 4/26.  Nephrology following.   ? ?Type 2 diabetes, poorly controlled with hyperglycemia: ?On insulin 75/25, 35 units twice daily at home.  NPO.  Keep on reduced dose of insulin and sliding scale insulin. ? ?Morbid obesity: BMI more than 40. ? ? ? ?DVT prophylaxis:   SCDs ? ? ?Code Status: Full code ?Family Communication: None ?Disposition Plan: Status is: Inpatient.  Remains  inpatient because of surgical procedure needed, amputation needed.  IV antibiotics needed. ?  ? ? ?Consultants:  ?Orthopedics ?Nephrology ?Vascular surgery ? ?Procedures:  ?None ? ?Antimicrobials:  ?Vancomycin and cefepime 4/24--- ? ? ?Subjective: ? ?Patient seen in dialysis.  Denies any complaints.  Denies any leg pain.  Afebrile overnight.  Going for procedure after the dialysis. ? ? ?Objective: ?Vitals:  ? 05/27/21 0945 05/27/21 1000 05/27/21 1030 05/27/21 1100  ?BP: 130/66 127/63 (!) 102/59 107/63  ?Pulse:      ?Resp: '16 16 16 13  '$ ?Temp:      ?TempSrc:      ?SpO2: 95% 97% 94% 96%  ?Weight:      ?Height:      ? ? ?Intake/Output Summary (Last 24 hours) at 05/27/2021 1133 ?Last data filed at 05/27/2021 0755 ?Gross per 24 hour  ?Intake 850 ml  ?Output 3301 ml  ?Net -2451 ml  ? ?Filed Weights  ? 05/25/21 2010 05/27/21 0500  ?Weight: 119.9 kg 119 kg  ? ? ?Examination: ? ?General exam: Appears calm.  Chronically sick looking.  Frail and debilitated.  Not in any distress.  On 4 L oxygen.  Getting dialysis today. ?Respiratory system: Clear to auscultation. Respiratory effort normal.  No added sounds.  On 4 L oxygen and adequately saturating.  ?Cardiovascular system: S1 & S2 heard, RRR.  ?Gastrointestinal system: Soft.  Nontender.  Bowel sound present. ?Central nervous system: Alert and oriented. No focal neurological deficits. ?Extremities: ?Multiple digit amputations of the hand.  Stable. ?Grossly purulent, chronic ischemic changes, draining ulcer left fifth toe.  Edematous leg. ? ? ?Data Reviewed: I have personally reviewed following labs and imaging studies ? ?CBC: ?Recent  Labs  ?Lab 05/25/21 ?2105 05/26/21 ?3614 05/26/21 ?2148 05/27/21 ?0844  ?WBC 11.8* 11.5* 12.9* 13.8*  ?HGB 7.2* 7.0* 8.4* 7.9*  ?HCT 23.1* 22.6* 26.7* 25.5*  ?MCV 98.3 99.6 96.0 95.9  ?PLT 252 234 254 254  ? ?Basic Metabolic Panel: ?Recent Labs  ?Lab 05/25/21 ?2105 05/26/21 ?4315 05/26/21 ?1500 05/26/21 ?2148  ?NA 129* 129* 132* 134*  ?K 4.3 4.0 4.1  4.7  ?CL 87* 88* 90* 89*  ?CO2 '30 25 24 28  '$ ?GLUCOSE 400* 308* 297* 282*  ?BUN 27* 34* 34* 43*  ?CREATININE 4.67* 5.00* 4.99* 5.85*  ?CALCIUM 8.3* 8.0* 8.1* 8.9  ?PHOS  --   --  4.0 5.0*  ? ?GFR: ?Estimated Creatinine Clearance: 14.6 mL/min (A) (by C-G formula based on SCr of 5.85 mg/dL (H)). ?Liver Function Tests: ?Recent Labs  ?Lab 05/25/21 ?2105 05/26/21 ?4008 05/26/21 ?1500 05/26/21 ?2148  ?AST 22 15  --   --   ?ALT 21 14  --   --   ?ALKPHOS 145* 119  --   --   ?BILITOT 0.7 0.4  --   --   ?PROT 7.7 7.1  --   --   ?ALBUMIN 2.5* 2.4* 2.4* 2.9*  ? ?No results for input(s): LIPASE, AMYLASE in the last 168 hours. ?No results for input(s): AMMONIA in the last 168 hours. ?Coagulation Profile: ?No results for input(s): INR, PROTIME in the last 168 hours. ?Cardiac Enzymes: ?No results for input(s): CKTOTAL, CKMB, CKMBINDEX, TROPONINI in the last 168 hours. ?BNP (last 3 results) ?No results for input(s): PROBNP in the last 8760 hours. ?HbA1C: ?No results for input(s): HGBA1C in the last 72 hours. ?CBG: ?Recent Labs  ?Lab 05/25/21 ?2351 05/26/21 ?0745 05/26/21 ?1826 05/26/21 ?2142 05/27/21 ?6761  ?GLUCAP 391* 270* 301* 162* 289*  ? ?Lipid Profile: ?No results for input(s): CHOL, HDL, LDLCALC, TRIG, CHOLHDL, LDLDIRECT in the last 72 hours. ?Thyroid Function Tests: ?No results for input(s): TSH, T4TOTAL, FREET4, T3FREE, THYROIDAB in the last 72 hours. ?Anemia Panel: ?No results for input(s): VITAMINB12, FOLATE, FERRITIN, TIBC, IRON, RETICCTPCT in the last 72 hours. ?Sepsis Labs: ?Recent Labs  ?Lab 05/25/21 ?2105 05/26/21 ?0040  ?LATICACIDVEN 2.4* 1.9  ? ? ?Recent Results (from the past 240 hour(s))  ?Blood culture (routine x 2)     Status: None (Preliminary result)  ? Collection Time: 05/25/21  9:20 PM  ? Specimen: BLOOD LEFT HAND  ?Result Value Ref Range Status  ? Specimen Description BLOOD LEFT HAND  Final  ? Special Requests   Final  ?  BOTTLES DRAWN AEROBIC AND ANAEROBIC Blood Culture adequate volume  ? Culture   Final   ?  NO GROWTH 2 DAYS ?Performed at [facility], [place] [address], [place], [place] [address] ?  ? Report Status PENDING  Incomplete  ?Blood culture (routine x 2)     Status: Abnormal (Preliminary result)  ? Collection Time: 05/25/21  9:42 PM  ? Specimen: BLOOD  ?Result Value Ref Range Status  ? Specimen Description BLOOD  Final  ? Special Requests   Final  ?  L EJ BOTTLES DRAWN AEROBIC AND ANAEROBIC Blood Culture adequate volume  ? Culture  Setup Time   Final  ?  GRAM POSITIVE COCCI IN CLUSTERS ?IN BOTH AEROBIC AND ANAEROBIC BOTTLES ?CRITICAL RESULT CALLED TO, READ BACK BY AND VERIFIED WITH: [place]. [id number] FCP ?  ? Culture (A)  Final  ?  STAPHYLOCOCCUS EPIDERMIDIS ?THE SIGNIFICANCE OF ISOLATING THIS ORGANISM FROM A SINGLE SET  OF BLOOD CULTURES WHEN MULTIPLE SETS ARE DRAWN IS UNCERTAIN. PLEASE NOTIFY THE MICROBIOLOGY DEPARTMENT WITHIN ONE WEEK IF SPECIATION AND SENSITIVITIES ARE REQUIRED. ?Performed at [facility], [place] [address] [place], [place] [address] ?  ? Report Status PENDING  Incomplete  ?Blood Culture ID Panel (Reflexed)     Status: Abnormal  ? Collection Time: 05/25/21  9:42 PM  ?Result Value Ref Range Status  ? Enterococcus faecalis NOT DETECTED NOT DETECTED Final  ? Enterococcus Faecium NOT DETECTED NOT DETECTED Final  ? Listeria monocytogenes NOT DETECTED NOT DETECTED Final  ? Staphylococcus species DETECTED (A) NOT DETECTED Final  ?  Comment: CRITICAL RESULT CALLED TO, READ BACK BY AND VERIFIED WITH: ?[place]. [id number] FCP ?  ? Staphylococcus aureus (BCID) NOT DETECTED NOT DETECTED Final  ? Staphylococcus epidermidis DETECTED (A) NOT DETECTED Final  ?  Comment: Methicillin (oxacillin) resistant coagulase negative staphylococcus. Possible blood culture contaminant (unless isolated from more than one blood culture draw or clinical case suggests pathogenicity). No antibiotic treatment is indicated for blood  ?culture contaminants. ?CRITICAL RESULT CALLED TO, READ BACK BY  AND VERIFIED WITH: ?[place]. [place] [id number] FCP ?  ? Staphylococcus lugdunensis NOT DETECTED NOT DETECTED Final  ? Streptococcus species NOT DETECTED NOT DETECTED Final  ? Streptococcus agalactiae NO

## 2021-05-27 NOTE — Progress Notes (Addendum)
?[name] ?Progress Note  ? ?Subjective:  Seen on HD - 2.5L UFG and tolerating. She did have an extra HD for volume yesterday, got 3L off. Denies CP/dyspnea. No foot pain at the moment. ? ?Objective ?Vitals:  ? 05/26/21 2330 05/27/21 0305 05/27/21 0500 05/27/21 0750  ?BP: 111/76 129/69  (!) 112/47  ?Pulse: 87   (!) 106  ?Resp: '17 18  20  '$ ?Temp: 97.9 ?F (36.6 ?C) 97.7 ?F (36.5 ?C)  98.7 ?F (37.1 ?C)  ?TempSrc: Oral Oral  Oral  ?SpO2: 95%     ?Weight:   119 kg   ?Height:      ? ?Physical Exam ?General: Well appearing woman, NAD. Nasal O2 in place ?Heart: RRR; no murmur ?Lungs: CTA anteriorly ?Abdomen: soft, non-tender ?Extremities: 1+ BLE edema ?Dialysis Access: RUE AVF + bruit ? ?Additional Objective ?Labs: ?Basic Metabolic Panel: ?Recent Labs  ?Lab 05/26/21 ?0174 05/26/21 ?1500 05/26/21 ?2148  ?NA 129* 132* 134*  ?K 4.0 4.1 4.7  ?CL 88* 90* 89*  ?CO2 '25 24 28  '$ ?GLUCOSE 308* 297* 282*  ?BUN 34* 34* 43*  ?CREATININE 5.00* 4.99* 5.85*  ?CALCIUM 8.0* 8.1* 8.9  ?PHOS  --  4.0 5.0*  ? ?Liver Function Tests: ?Recent Labs  ?Lab 05/25/21 ?2105 05/26/21 ?9449 05/26/21 ?1500 05/26/21 ?2148  ?AST 22 15  --   --   ?ALT 21 14  --   --   ?ALKPHOS 145* 119  --   --   ?BILITOT 0.7 0.4  --   --   ?PROT 7.7 7.1  --   --   ?ALBUMIN 2.5* 2.4* 2.4* 2.9*  ? ?CBC: ?Recent Labs  ?Lab 05/25/21 ?2105 05/26/21 ?6759 05/26/21 ?2148 05/27/21 ?0844  ?WBC 11.8* 11.5* 12.9* 13.8*  ?HGB 7.2* 7.0* 8.4* 7.9*  ?HCT 23.1* 22.6* 26.7* 25.5*  ?MCV 98.3 99.6 96.0 95.9  ?PLT 252 234 254 254  ? ?Blood Culture ?   ?Component Value Date/Time  ? [place] BLOOD 05/25/2021 2142  ? SPECREQUEST  05/25/2021 2142  ?  L EJ BOTTLES DRAWN AEROBIC AND ANAEROBIC Blood Culture adequate volume  ? CULT GRAM POSITIVE COCCI 05/25/2021 2142  ? REPTSTATUS PENDING 05/25/2021 2142  ? ?Studies/Results: ?DG Foot 2 Views Left ? ?Result Date: 05/25/2021 ?CLINICAL DATA:  Osteomyelitis of fifth digit. EXAM: LEFT FOOT - 2 VIEW COMPARISON:  None. FINDINGS: There is soft  tissue swelling and air in the region of the fourth and fifth metatarsals, fourth proximal phalanx, and throughout the fifth toe worrisome for infection. There is fragmentation which may represent erosion versus fracture involving the base of the fifth distal phalanx in the distal aspect of the fifth proximal phalanx. This appears nondisplaced. No dislocation. There is likely a healed fracture of the fifth proximal metatarsal. Peripheral vascular calcifications are present. IMPRESSION: 1. Soft tissue swelling and air involving the fourth and fifth metatarsals and phalanges worrisome for infection. 2. Fragmentation secondary to osteomyelitis versus fracture involving the fifth proximal and distal phalanx. Please correlate clinically. Electronically Signed   By: Ronney Asters M.D.   On: 05/25/2021 20:33  ? ?VAS [place] ABI WITH/WO TBI ? ?Result Date: 05/26/2021 ? LOWER EXTREMITY DOPPLER STUDY Patient Name:  [name]  Date of Exam:   05/26/2021 Medical Rec #: [id number]           Accession #:    [id number] Date of Birth: [birth year]            Patient Gender: F Patient Age:   56  years Exam Location:  [facility] Procedure:      VAS [place] ABI WITH/WO TBI Referring Phys: MARCUS DUDA --------------------------------------------------------------------------------  Indications: PVD w/ gangrene LE left. High Risk         Hypertension, hyperlipidemia, Diabetes, coronary artery Factors:          disease.  Comparison Study: no prior Performing Technologist: Archie Patten RVS  Examination Guidelines: A complete evaluation includes at minimum, Doppler waveform signals and systolic blood pressure reading at the level of bilateral brachial, anterior tibial, and posterior tibial arteries, when vessel segments are accessible. Bilateral testing is considered an integral part of a complete examination. Photoelectric Plethysmograph (PPG) waveforms and toe systolic pressure readings are included as required and additional duplex  testing as needed. Limited examinations for reoccurring indications may be performed as noted.  ABI Findings: +---------+------------------+-----+----------+--------------------------------+ Right    Rt Pressure (mmHg)IndexWaveform  Comment                          +---------+------------------+-----+----------+--------------------------------+ Brachial                                  fistula                          +---------+------------------+-----+----------+--------------------------------+ PTA      79                0.70 monophasic                                 +---------+------------------+-----+----------+--------------------------------+ DP       255               2.26 monophasic                                 +---------+------------------+-----+----------+--------------------------------+ Great Toe                                 unable to obtain pressure due to                                           decreased toe amplitude          +---------+------------------+-----+----------+--------------------------------+ +---------+------------------+-----+-------------------+-------+ Left     Lt Pressure (mmHg)IndexWaveform           Comment +---------+------------------+-----+-------------------+-------+ Brachial 113                    triphasic                  +---------+------------------+-----+-------------------+-------+ PTA      92                0.81 monophasic                 +---------+------------------+-----+-------------------+-------+ DP       91                0.81 dampened monophasic        +---------+------------------+-----+-------------------+-------+ [name]  0.23 Abnormal                   +---------+------------------+-----+-------------------+-------+ +-------+-----------+-----------+------------+------------+ ABI/TBIToday's ABIToday's TBIPrevious ABIPrevious TBI  +-------+-----------+-----------+------------+------------+ Right  2.25                                           +-------+-----------+-----------+------------+------------+ Left   0.81       0.23                                +-------+-----------+-----------+------------+------------+ Arterial wall calcification precludes accurate ankle pressures and ABIs.  Summary: Right: Resting right ankle-brachial index indicates noncompressible right lower extremity arteries. Left: Resting left ankle-brachial index indicates mild left lower extremity arterial disease. The left toe-brachial index is abnormal. ABI's may be unreliable due to due calcification. *See table(s) above for measurements and observations.  Electronically signed by Servando Snare MD on 05/26/2021 at 4:04:17 PM.    Final    ? ?Medications: ? sodium chloride    ? sodium chloride    ? ceFEPime (MAXIPIME) IV 1 g (05/26/21 2302)  ? vancomycin    ? ? Chlorhexidine Gluconate Cloth  6 each Topical Q0600  ? Chlorhexidine Gluconate Cloth  6 each Topical Q0600  ? insulin aspart  0-5 Units Subcutaneous QHS  ? insulin aspart  0-6 Units Subcutaneous TID WC  ? insulin aspart protamine- aspart  20 Units Subcutaneous BID WC  ? metoprolol succinate  100 mg Oral Daily  ? rosuvastatin  40 mg Oral Daily  ? sodium chloride flush  3 mL Intravenous Q12H  ? ? ?Dialysis Orders: ?MWF  - GOC ?4hrs, BFR 450, DFR 800,  EDW 110.6kg, 2K/ 2.5Ca, RU AVF, Heparin 10000 unit bolus ? - No ESA 2/2 ?malignancy (no diagnosis - has stable renal lesion, pulm lesions -> saw onc 4/17, plan is for bronchoscopy with Bx of lung nodules in near future) ?- Calcitriol 2.'5mg'$  qHD ?- Parsabiv 7.'5mg'$  qHD  ? ?Assessment/Plan: ? 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  Plan for angiogram with possible intervention 4/26 per vascular.  If circulation improved may be able to save half of her foot otherwise will require BKA. 1 of 2  Blood Cx + for MRSA. On Vanc/Cefepime. ?Volume overload: S/p extra HD 4/25, now back on usual schedule. ?ESRD: Continue HD per usual MWF schedule - HD now - 2.5L UFG. Remains above EDW.  ?Hypertension: BP variable, on metoprolol QHS, may need to reduce dose. Can utilize midodrine and albumin with HD for BP support.  ?Anem

## 2021-05-27 NOTE — Progress Notes (Signed)
?  Transition of Care (TOC) Screening Note ? ? ?Patient Details  ?Name: [name] ?Date of Birth: 03-Dec-1965 ? ? ?Transition of Care (TOC) CM/SW Contact:    ?Benard Halsted, LCSW ?Phone Number: ?05/27/2021, 8:49 AM ? ? ? ?Transition of Care Department [facility]) has reviewed patient and no TOC needs have been identified at this time. We will continue to monitor patient advancement through interdisciplinary progression rounds. If new patient transition needs arise, please place a TOC consult. ? ? ?

## 2021-05-27 NOTE — Op Note (Addendum)
? ? ?Patient name: [name] MRN: [id number] DOB: 10/20/1965 Sex: female ? 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  Ultrasound-guided micropuncture access of the right common femoral artery ?2.  Aortogram ?3.  Second-order cannulation, left lower extremity angiogram ?4.  Laser atherectomy of the anterior tibial artery ?5.  Balloon angioplasty of the anterior tibial artery ?6.  Device assisted closure-Mynx ? ?Indications: Patient is a 56 year old female who presented to the ED with dry gangrene of the left fifth toe.  ABIs demonstrated monophasic waveforms in the left lower extremity.  After discussing risks and benefits of left lower extremity angiography in an effort to define and improve distal perfusion for wound healing, Temitope ? ?Findings:  ?Aortogram: Bilateral renal arteries patent, no flow-limiting stenosis. ?On the left: Normal common femoral artery, normal profunda, Fishel femoral artery with previously placed stents.  These are patent.  Popliteal artery without stenosis.  The posterior tibial artery provides single-vessel inline flow to the foot with filling of medial and lateral plantar arteries.  The peroneal artery occludes in the mid tibia, anterior tibial artery occludes immediately distal to its ostia. ? ?  ?Procedure:  The patient was identified in the holding area and taken to room 8.  The patient was then placed supine on the table and prepped and draped in the usual sterile fashion.  A time out was called.  Ultrasound was used to evaluate the right common femoral artery.  It was patent .  A digital ultrasound image was acquired.  A micropuncture needle was used to access the right common femoral artery under ultrasound guidance.  An 018 wire was advanced without resistance and a micropuncture sheath was placed.  The 018 wire was removed and a  benson wire was placed.  The micropuncture sheath was exchanged for a 5 french sheath.  An omniflush catheter was advanced over the wire to the level of L-1.  An abdominal angiogram was obtained.  Next, using the omniflush catheter and a benson wire, the aortic bifurcation was crossed and the catheter was placed into theleft external iliac artery and left runoff was obtained.  right runoff was performed via retrograde sheath injections. ? ?Being that the patient has tissue loss on the left lower extremity, I elected to try anterior tibial artery recanalization.  The patient was heparinized.  A 6 x 65 cm sheath was brought onto the field and parked in the superficial femoral artery, from this location, a series of wires and catheters were used to select the anterior tibial artery, traverse the artery, and into the dorsalis pedis in the foot.  True lumen was confirmed with use of angiography.  Next, 0.9 mm laser atherectomy was brought onto the field and an effort to create a larger lumen.  This passed without issue.  Next, a 2.5 x 222m balloon was brought onto the field and expanded from the dorsalis pedis, into the below-knee popliteal artery.  On follow-up angiography, there was no filling of the anterior tibial artery.  The area was ballooned again with a noncompliant J balloon, and again, there was no filling of the anterior tibial artery.  There did not appear to be any thrombus in the lumen, my concern was for dissection flap.  Being that the patient had inline flow to the foot, I elected not to stent the proximal anterior tibial artery. ? ?Impression: Unsuccessful attempt at recanalization of the  anterior tibial artery using laser atherectomy, balloon angioplasty. ?Patient with single-vessel posterior tibial artery runoff to the foot ? ? ?J. Melene Muller, MD ?[facility] ?Office: [phone] ? ? ? ?

## 2021-05-27 NOTE — Procedures (Signed)
Patient was seen on dialysis and the procedure was supervised.  BFR 400  Via AVF BP is  127/63. ? ? Patient appears to be tolerating treatment well. ? ?[place] ?05/27/2021 ? ?

## 2021-05-28 ENCOUNTER — Encounter (HOSPITAL_COMMUNITY): Payer: Self-pay | Admitting: Vascular Surgery

## 2021-05-28 ENCOUNTER — Inpatient Hospital Stay (HOSPITAL_COMMUNITY): Payer: Medicare Other | Admitting: Anesthesiology

## 2021-05-28 ENCOUNTER — Inpatient Hospital Stay
Admission: RE | Admit: 2021-05-28 | Discharge: 2021-05-28 | Disposition: A | Discharge status: Home / Self Care | Payer: Medicare Other | Source: Ambulatory Visit | Attending: Emergency Medicine | Admitting: Emergency Medicine

## 2021-05-28 ENCOUNTER — Ambulatory Visit: Payer: Medicare Other | Admitting: Podiatrist

## 2021-05-28 ENCOUNTER — Telehealth: Payer: Self-pay | Admitting: Emergency Medicine

## 2021-05-28 ENCOUNTER — Encounter (HOSPITAL_COMMUNITY)
Admission: EM | Disposition: A | Discharge status: Skilled Nursing Facility | Payer: Self-pay | Source: Home / Self Care | Attending: Internal Medicine

## 2021-05-28 DIAGNOSIS — N186 End stage renal disease: Secondary | ICD-10-CM | POA: Diagnosis not present

## 2021-05-28 DIAGNOSIS — M86272 Subacute osteomyelitis, left ankle and foot: Secondary | ICD-10-CM | POA: Diagnosis not present

## 2021-05-28 DIAGNOSIS — G4730 Sleep apnea, unspecified: Secondary | ICD-10-CM

## 2021-05-28 DIAGNOSIS — Z992 Dependence on renal dialysis: Secondary | ICD-10-CM | POA: Diagnosis not present

## 2021-05-28 DIAGNOSIS — I70262 Atherosclerosis of native arteries of extremities with gangrene, left leg: Secondary | ICD-10-CM | POA: Diagnosis not present

## 2021-05-28 DIAGNOSIS — F419 Anxiety disorder, unspecified: Secondary | ICD-10-CM

## 2021-05-28 DIAGNOSIS — E1152 Type 2 diabetes mellitus with diabetic peripheral angiopathy with gangrene: Secondary | ICD-10-CM

## 2021-05-28 DIAGNOSIS — D631 Anemia in chronic kidney disease: Secondary | ICD-10-CM | POA: Diagnosis not present

## 2021-05-28 DIAGNOSIS — Z794 Long term (current) use of insulin: Secondary | ICD-10-CM

## 2021-05-28 HISTORY — PX: AMPUTATION: SHX166

## 2021-05-28 LAB — LIPID PANEL
Cholesterol: 158 mg/dL (ref 0–200)
HDL: 24 mg/dL — ABNORMAL LOW (ref >40)
LDL Cholesterol: 75 mg/dL (ref 0–99)
Total CHOL/HDL Ratio: 6.6 RATIO
Triglycerides: 296 mg/dL — ABNORMAL HIGH (ref <150)
VLDL: 59 mg/dL — ABNORMAL HIGH (ref 0–40)

## 2021-05-28 LAB — POCT I-STAT, CHEM 8
BUN: 70 mg/dL — ABNORMAL HIGH (ref 6–20)
Calcium, Ion: 1.04 mmol/L — ABNORMAL LOW (ref 1.15–1.40)
Chloride: 97 mmol/L — ABNORMAL LOW (ref 98–111)
Creatinine, Ser: 7.9 mg/dL — ABNORMAL HIGH (ref 0.44–1.00)
Glucose, Bld: 221 mg/dL — ABNORMAL HIGH (ref 70–99)
HCT: 24 % — ABNORMAL LOW (ref 36.0–46.0)
Hemoglobin: 8.2 g/dL — ABNORMAL LOW (ref 12.0–15.0)
Potassium: 5.2 mmol/L — ABNORMAL HIGH (ref 3.5–5.1)
Sodium: 130 mmol/L — ABNORMAL LOW (ref 135–145)
TCO2: 24 mmol/L (ref 22–32)

## 2021-05-28 LAB — BLOOD CULTURE ID PANEL (REFLEXED) - BCID2

## 2021-05-28 LAB — HEPATITIS B SURFACE ANTIGEN: Hepatitis B Surface Ag: NONREACTIVE

## 2021-05-28 LAB — CULTURE, BLOOD (ROUTINE X 2)

## 2021-05-28 LAB — GLUCOSE, CAPILLARY
Glucose-Capillary: 194 mg/dL — ABNORMAL HIGH (ref 70–99)
Glucose-Capillary: 229 mg/dL — ABNORMAL HIGH (ref 70–99)
Glucose-Capillary: 184 mg/dL — ABNORMAL HIGH (ref 70–99)
Glucose-Capillary: 187 mg/dL — ABNORMAL HIGH (ref 70–99)
Glucose-Capillary: 200 mg/dL — ABNORMAL HIGH (ref 70–99)
Glucose-Capillary: 243 mg/dL — ABNORMAL HIGH (ref 70–99)

## 2021-05-28 LAB — HEPATITIS B SURFACE ANTIBODY,QUALITATIVE: Hep B S Ab: REACTIVE — AB

## 2021-05-28 SURGERY — AMPUTATION, FOOT, RAY
Anesthesia: Monitor Anesthesia Care | Site: Foot | Laterality: Left

## 2021-05-28 MED ORDER — CHLORHEXIDINE GLUCONATE 0.12 % MT SOLN
OROMUCOSAL | Status: AC
  Administered 2021-05-28: 15 mL via OROMUCOSAL
  Filled 2021-05-28: qty 15

## 2021-05-28 MED ORDER — METOPROLOL SUCCINATE ER 25 MG PO TB24
ORAL_TABLET | ORAL | Status: AC
  Administered 2021-05-28: 100 mg via ORAL
  Filled 2021-05-28: qty 4

## 2021-05-28 MED ORDER — INSULIN ASPART 100 UNIT/ML IJ SOLN
INTRAMUSCULAR | Status: AC
  Administered 2021-05-28: 3 [IU] via SUBCUTANEOUS
  Filled 2021-05-28: qty 1

## 2021-05-28 MED ORDER — CHLORHEXIDINE GLUCONATE 0.12 % MT SOLN: 15.0000 mL | Freq: Once | OROMUCOSAL | Status: AC

## 2021-05-28 MED ORDER — PROPOFOL 500 MG/50ML IV EMUL
INTRAVENOUS | Status: DC | PRN
  Administered 2021-05-28: 25 ug/kg/min via INTRAVENOUS

## 2021-05-28 MED ORDER — SODIUM CHLORIDE 0.9 % IV SOLN: INTRAVENOUS | Status: DC

## 2021-05-28 MED ORDER — INSULIN ASPART PROT & ASPART (70-30 MIX) 100 UNIT/ML ~~LOC~~ SUSP
35.0000 [IU] | Freq: Two times a day (BID) | SUBCUTANEOUS | Status: DC
  Administered 2021-05-28 – 2021-06-06 (×11): 35 [IU] via SUBCUTANEOUS

## 2021-05-28 MED ORDER — LIDOCAINE HCL (PF) 1 % IJ SOLN
INTRAMUSCULAR | Status: AC
  Filled 2021-05-28: qty 30

## 2021-05-28 MED ORDER — 0.9 % SODIUM CHLORIDE (POUR BTL) OPTIME
TOPICAL | Status: DC | PRN
  Administered 2021-05-28: 1000 mL

## 2021-05-28 MED ORDER — LIDOCAINE 2% (20 MG/ML) 5 ML SYRINGE
INTRAMUSCULAR | Status: DC | PRN
  Administered 2021-05-28: 40 mg via INTRAVENOUS

## 2021-05-28 MED ORDER — FENTANYL CITRATE (PF) 100 MCG/2ML IJ SOLN: 25.0000 ug | INTRAMUSCULAR | Status: DC | PRN

## 2021-05-28 MED ORDER — ACETAMINOPHEN 10 MG/ML IV SOLN: 1000.0000 mg | Freq: Once | INTRAVENOUS | Status: DC | PRN

## 2021-05-28 MED ORDER — ORAL CARE MOUTH RINSE: 15.0000 mL | Freq: Once | OROMUCOSAL | Status: AC

## 2021-05-28 MED ORDER — INSULIN ASPART 100 UNIT/ML IJ SOLN: 0.0000 [IU] | INTRAMUSCULAR | Status: DC | PRN

## 2021-05-28 MED ORDER — LIDOCAINE HCL (PF) 1 % IJ SOLN
INTRAMUSCULAR | Status: DC | PRN
  Administered 2021-05-28: 15 mL

## 2021-05-28 SURGICAL SUPPLY — 30 items
BAG COUNTER SPONGE SURGICOUNT (BAG) ×3 IMPLANT
BAG SPNG CNTER NS LX DISP (BAG) ×1
BNDG ELASTIC 4X5.8 VLCR STR LF (GAUZE/BANDAGES/DRESSINGS) ×3 IMPLANT
BNDG GAUZE ELAST 4 BULKY (GAUZE/BANDAGES/DRESSINGS) ×3 IMPLANT
CANISTER SUCT 3000ML PPV (MISCELLANEOUS) ×3 IMPLANT
CLIP LIGATING EXTRA MED SLVR (CLIP) ×3 IMPLANT
CLIP LIGATING EXTRA SM BLUE (MISCELLANEOUS) ×3 IMPLANT
COVER SURGICAL LIGHT HANDLE (MISCELLANEOUS) ×3 IMPLANT
DRAPE EXTREMITY T 121X128X90 (DISPOSABLE) ×3 IMPLANT
DRAPE HALF SHEET 40X57 (DRAPES) ×3 IMPLANT
ELECT REM PT RETURN 9FT ADLT (ELECTROSURGICAL) ×2
ELECTRODE REM PT RTRN 9FT ADLT (ELECTROSURGICAL) ×2 IMPLANT
GAUZE SPONGE 4X4 12PLY STRL (GAUZE/BANDAGES/DRESSINGS) ×3 IMPLANT
GLOVE BIO SURGEON STRL SZ7.5 (GLOVE) ×3 IMPLANT
GOWN STRL REUS W/ TWL LRG LVL3 (GOWN DISPOSABLE) ×4 IMPLANT
GOWN STRL REUS W/ TWL XL LVL3 (GOWN DISPOSABLE) ×2 IMPLANT
GOWN STRL REUS W/TWL LRG LVL3 (GOWN DISPOSABLE) ×4
GOWN STRL REUS W/TWL XL LVL3 (GOWN DISPOSABLE) ×2
KIT BASIN OR (CUSTOM PROCEDURE TRAY) ×3 IMPLANT
KIT TURNOVER KIT B (KITS) ×3 IMPLANT
NDL HYPO 25GX1X1/2 BEV (NEEDLE) IMPLANT
NEEDLE HYPO 25GX1X1/2 BEV (NEEDLE) IMPLANT
NS IRRIG 1000ML POUR BTL (IV SOLUTION) ×3 IMPLANT
PACK GENERAL/GYN (CUSTOM PROCEDURE TRAY) ×3 IMPLANT
PAD ARMBOARD 7.5X6 YLW CONV (MISCELLANEOUS) ×6 IMPLANT
SUT ETHILON 3 0 PS 1 (SUTURE) ×3 IMPLANT
SYR CONTROL 10ML LL (SYRINGE) IMPLANT
TOWEL GREEN STERILE (TOWEL DISPOSABLE) ×6 IMPLANT
UNDERPAD 30X36 HEAVY ABSORB (UNDERPADS AND DIAPERS) ×3 IMPLANT
WATER STERILE IRR 1000ML POUR (IV SOLUTION) ×3 IMPLANT

## 2021-05-28 NOTE — Anesthesia Preprocedure Evaluation (Signed)
Anesthesia Evaluation  ?Patient identified by MRN, date of birth, ID band ?Patient awake ? ? ? ?Airway ?Mallampati: III ? ?TM Distance: >3 FB ?Neck ROM: Full ? ? ? Dental ?no notable dental hx. ? ?  ?Pulmonary ?sleep apnea , Current Smoker and Patient abstained from smoking.,  ?  ?Pulmonary exam normal ? ? ? ? ? ? ? Cardiovascular ?hypertension, Pt. on medications and Pt. on home beta blockers ?+ CAD, + Peripheral Vascular Disease and +CHF  ? ?Rhythm:Regular Rate:Normal ? ? ?  ?Neuro/Psych ? Headaches, Anxiety   ? GI/Hepatic ?negative GI ROS, Neg liver ROS,   ?Endo/Other  ?diabetes, Type 2, Insulin Dependent ? Renal/GU ?ESRF and DialysisRenal disease  ?negative genitourinary ?  ?Musculoskeletal ? ?(+) Arthritis , Osteoarthritis,  Gangrenous toe  ? Abdominal ?Normal abdominal exam  (+)   ?Peds ? Hematology ? ?(+) Blood dyscrasia, anemia ,   ?Anesthesia Other Findings ? ? Reproductive/Obstetrics ? ?  ? ? ? ? ? ? ? ? ? ? ? ? ? ?  ?  ? ? ? ? ? ? ? ? ?Anesthesia Physical ?Anesthesia Plan ? ?ASA: 3 ? ?Anesthesia Plan: MAC  ? ?Post-op Pain Management:   ? ?Induction: Intravenous ? ?PONV Risk Score and Plan: 1 and Ondansetron, Dexamethasone, Propofol infusion, Midazolam and Treatment may vary due to age or medical condition ? ?Airway Management Planned: Simple Face Mask, Natural Airway and Nasal Cannula ? ?Additional Equipment: None ? ?Intra-op Plan:  ? ?Post-operative Plan:  ? ?Informed Consent: I have reviewed the patients History and Physical, chart, labs and discussed the procedure including the risks, benefits and alternatives for the proposed anesthesia with the patient or authorized representative who has indicated his/her understanding and acceptance.  ? ? ? ?Dental advisory given ? ?Plan Discussed with: CRNA ? ?Anesthesia Plan Comments: (ECHO 10/22: ??1. Left ventricular ejection fraction, by estimation, is 60 to 65%. The  ?left ventricle has normal function. The left ventricle has  no regional  ?wall motion abnormalities. There is moderate left ventricular hypertrophy.  ?Left ventricular diastolic  ?parameters are consistent with Grade II diastolic dysfunction  ?(pseudonormalization). There is the interventricular septum is flattened  ?in systole, consistent with right ventricular pressure overload.  ??2. Right ventricular systolic function is mildly reduced. The right  ?ventricular size is mildly enlarged. There is moderately elevated  ?pulmonary artery systolic pressure. The estimated right ventricular  ?systolic pressure is 54.0 mmHg.  ??3. Left atrial size was severely dilated.  ??4. Right atrial size was severely dilated.  ??5. The mitral valve is grossly normal. Mild to moderate mitral valve  ?regurgitation. No evidence of mitral stenosis.  ??6. The aortic valve is tricuspid. There is mild calcification of the  ?aortic valve. Aortic valve regurgitation is not visualized. Mild aortic  ?valve sclerosis is present, with no evidence of aortic valve stenosis.  ??7. The inferior vena cava is dilated in size with <50% respiratory  ?variability, suggesting right atrial pressure of 15 mmHg.  ? ?Comparison(s): No significant change from prior study.  ? ?Lab Results ?     Component                Value               Date                 ?     WBC  13.8 (H)            05/27/2021           ?     HGB                      7.9 (L)             05/27/2021           ?     HCT                      25.5 (L)            05/27/2021           ?     MCV                      95.9                05/27/2021           ?     PLT                      254                 05/27/2021           ?Lab Results ?     Component                Value               Date                 ?     NA                       134 (L)             05/26/2021           ?     K                        4.7                 05/26/2021           ?     CO2                      28                  05/26/2021           ?     GLUCOSE                   282 (H)             05/26/2021           ?     BUN                      43 (H)              05/26/2021           ?     CREATININE               5.85 (H)            05/26/2021           ?  CALCIUM                  8.9                 05/26/2021           ?     GFRNONAA                 8 (L)               05/26/2021          )  ? ? ? ? ? ? ?Anesthesia Quick Evaluation ? ?

## 2021-05-28 NOTE — Transfer of Care (Signed)
Immediate Anesthesia Transfer of Care Note ? ?Patient: [name] ? ?Procedure(s) Performed: LEFT 5TH TOE RAY AMPUTATION (Left: Foot) ? ?Patient Location: PACU ? ?Anesthesia Type:MAC ? ?Level of Consciousness: awake, alert  and oriented ? ?Airway & Oxygen Therapy: Patient Spontanous Breathing and Patient connected to nasal cannula oxygen ? ?Post-op Assessment: Report given to RN and Post -op Vital signs reviewed and stable ? ?Post vital signs: Reviewed and stable ? ?Last Vitals:  ?Vitals Value Taken Time  ?BP 112/58 05/28/21 1025  ?Temp 36.5 ?C 05/28/21 1025  ?Pulse 91 05/28/21 1029  ?Resp 27 05/28/21 1032  ?SpO2 97 % 05/28/21 1029  ?Vitals shown include unvalidated device data. ? ?Last Pain:  ?Vitals:  ? 05/28/21 1025  ?TempSrc:   ?PainSc: 0-No pain  ?   ? ?Patients Stated Pain Goal: 4 (05/28/21 0846) ? ?Complications: No notable events documented. ?

## 2021-05-28 NOTE — Plan of Care (Signed)
?  Problem: Education: ?Goal: Knowledge of General Education information will improve ?Description: Including pain rating scale, medication(s)/side effects and non-pharmacologic comfort measures ?Outcome: Progressing ?  ?Problem: Health Behavior/Discharge Planning: ?Goal: Ability to manage health-related needs will improve ?Outcome: Progressing ?  ?Problem: Clinical Measurements: ?Goal: Ability to maintain clinical measurements within normal limits will improve ?Outcome: Progressing ?Goal: Will remain free from infection ?Outcome: Progressing ?Goal: Diagnostic test results will improve ?Outcome: Progressing ?Goal: Respiratory complications will improve ?Outcome: Progressing ?Goal: Cardiovascular complication will be avoided ?Outcome: Progressing ?  ?Problem: Elimination: ?Goal: Will not experience complications related to bowel motility ?Outcome: Progressing ?Goal: Will not experience complications related to urinary retention ?Outcome: Progressing ?  ?Problem: Safety: ?Goal: Ability to remain free from injury will improve ?Outcome: Progressing ?  ?Problem: Skin Integrity: ?Goal: Risk for impaired skin integrity will decrease ?Outcome: Progressing ?  ?

## 2021-05-28 NOTE — Telephone Encounter (Signed)
Denise from [facility] called to let us know she had to cancel [name]'s CT scan today because she is currently admitted to the hospital.   ?

## 2021-05-28 NOTE — Anesthesia Postprocedure Evaluation (Signed)
Anesthesia Post Note ? ?Patient: [name] ? ?Procedure(s) Performed: LEFT 5TH TOE RAY AMPUTATION (Left: Foot) ? ?  ? ?Patient location during evaluation: PACU ?Anesthesia Type: MAC ?Level of consciousness: awake and alert ?Pain management: pain level controlled ?Vital Signs Assessment: post-procedure vital signs reviewed and stable ?Respiratory status: spontaneous breathing, nonlabored ventilation, respiratory function stable and patient connected to nasal cannula oxygen ?Cardiovascular status: stable and blood pressure returned to baseline ?Postop Assessment: no apparent nausea or vomiting ?Anesthetic complications: no ? ? ?No notable events documented. ? ?Last Vitals:  ?Vitals:  ? 05/28/21 1030 05/28/21 1038  ?BP:  107/60  ?Pulse: 91 91  ?Resp: 19 20  ?Temp:    ?SpO2: 97% 95%  ?  ?Last Pain:  ?Vitals:  ? 05/28/21 1025  ?TempSrc:   ?PainSc: 0-No pain  ? ? ?  ?  ?  ?  ?  ?  ? ?March Rummage [name] ? ? ? ? ?

## 2021-05-28 NOTE — Op Note (Signed)
? ? ?  Patient name: [name] MRN: [id number] DOB: 1965-02-14 Sex: female ? ?05/28/2021 ?Pre-operative Diagnosis: left foot infection ?Post-operative diagnosis:  Same ?Surgeon:  Eda Paschal. Donzetta Matters, MD ?Procedure Performed:  Amputation of the left fourth and fifth toes including metatarsal bones and sharp excisional debridement left foot of skin and soft tissue measuring 12 x 7 cm ? ? ?Indications: 56 year old female presented with malodorous infected left foot confirmed with x-ray.  She was thought to have a viable foot underwent angiography which demonstrates posterior tibial runoff she did have attempted treatment of the anterior tibial artery.  Given the extensive necrosis she is now indicated for amputation of at least the fifth possibly the fourth toe and debridement of necrotic tissue. ? ?Findings: Necrotic tissue extends back to the middle of the foot.  The fourth and fifth toes were removed including the metatarsal bones.  There was significant dishwater type fluid that was removed.  At completion I placed a wet-to-dry Betadine soaked kerlix in the wound. ? ?Patient is at high risk for left transtibial amputation. ? ? ?  ?Procedure:  The patient was identified in the holding area and taken to the operating room where she is placed supine operative table and MAC anesthesia was induced.  She was sterilely prepped and draped in the left foot and ankle in usual fashion, antibiotics were minister timeout was called.  I injected approximately 20 cc of 1% lidocaine around the infected tissue.  I then initially made an incision around the base of the fifth toe and then realized there was necrosis over the fourth toe.  Incision was carried over the fourth toe this was taken back all the way to the metatarsal tarsal joint and the metatarsal bones of both were removed with the toes and block.  I then sharply debrided tissue back to bleeding tissue proximally in the foot that included skin and soft tissue up to 12 x 7  cm.  The third toe does look somewhat marginal at its base but I left this.  I thoroughly irrigated the wound obtaining stasis and then packed it with Betadine soaked Kerlix.  She will be at high risk for BKA. ? ?EBL: 10 cc ? ?Karista Aispuro C. Donzetta Matters, MD ?[facility] ?Office: [phone] ?Pager: [phone] ? ? ?

## 2021-05-28 NOTE — Progress Notes (Signed)
?PROGRESS NOTE ? ? ? ?[name]  ZOX:[id number] DOB: [birth year] DOA: 05/25/2021 ?PCP: Sandi Mariscal, MD  ? ? 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  She had a chronic wound on her left fifth toe and last 3 days it has been bleeding and draining.  In the emergency room temperature 101.8, tachycardia 140, blood pressure stable.  On 4 L oxygen.  Hemoglobin 7.2.  Lactic acid 2.4.  Left foot x-ray with soft tissue swelling and osteomyelitis fifth phalanx.  Started on antibiotics, orthopedics and nephrology consulted. ? ? ?Assessment & Plan: ?  ?Sepsis present on admission secondary to left foot osteomyelitis, diabetic foot ulcer with osteomyelitis: ?1 out of 4 blood cultures positive for Staph epidermidis that is MRSA.  Currently remains on vancomycin and cefepime. ?4/26, angiography, posterior tibial runoff.  Balloon dilatation. ?4/27, fourth and fifth toe ray amputation, trying to preserve the leg, however high risk of transtibial amputation as per surgery.   ?Patient agreeable for transtibial amputation if no chance of healing or complicated postop course. ? ?Essential hypertension: Blood pressure stable.  Home metoprolol. ? ?Hyperlipidemia: On statin. ? ?Rheumatoid arthritis: Takes Humira at home.  On hold due to acute infection. ? ?Anemia of chronic disease: Received 1 unit of PRBC with dialysis 4/25.  Hemoglobin is 8.2. ? ?ESRD on hemodialysis: Receive dialysis 4/24 as outpatient.  ?Optimizing with subsequent hemodialysis 4/25, 4/26.  Nephrology following.  Next HD tomorrow. ? ?Type 2 diabetes, poorly controlled with hyperglycemia: ?On insulin 75/25, 35 units twice daily at home.  NPO.  Keep on reduced dose of insulin and sliding scale  insulin. ?Patient eating diabetic diet now.  Increase dose of insulin today. ? ?Morbid obesity: BMI more than 40. ? ? ? ?DVT prophylaxis:   SCDs ? ? ?Code Status: Full code ?Family Communication: None ?Disposition Plan: Status is: Inpatient.  Remains inpatient, inpatient surgical procedures, hemodialysis. ? ? ?Consultants:  ?Orthopedics ?Nephrology ?Vascular surgery ? ?Procedures:  ?None ? ?Antimicrobials:  ?Vancomycin and cefepime 4/24--- ? ? ?Subjective: ? ?Patient seen and examined.  Sitting at the edge of the bed.  Came back from procedure.  She asked me about surgeons description of the wound.  I explained to her.  She is convinced that the foot may not improve and she is ready to go for transtibial amputation if that is what is recommended.  Patient has very reasonable expectations.  No other events.  Pain is controlled. ? ? ?Objective: ?Vitals:  ? 05/28/21 1115 05/28/21 1130 05/28/21 1145 05/28/21 1200  ?BP: (!) 101/51 109/61 105/62 (!) 109/58  ?Pulse: 86 84 84 79  ?Resp: 20 (!) 22 (!) 22 (!) 22  ?Temp:      ?TempSrc:      ?SpO2: 94% 91% 94% 91%  ?Weight:      ?Height:      ? ? ?Intake/Output Summary (Last 24 hours) at 05/28/2021 1453 ?Last data filed at 05/28/2021 0600 ?Gross per 24 hour  ?Intake 143.25 ml  ?Output --  ?Net 143.25 ml  ? ?Filed Weights  ? 05/25/21 2010 05/27/21 0500 05/28/21 0604  ?Weight: 119.9 kg 119 kg 113.4 kg  ? ? ?Examination: ? ?General: Chronically sick looking.  Frail and debilitated.  Comfortable at rest. ?Cardiovascular: S1-S2 normal.  Regular rate rhythm. ?Respiratory: Bilateral clear.  No  added sounds. ?Gastrointestinal: Soft.  Nontender.  Obese and pendulous. ?Ext: Chronic ischemic changes.  Multiple digital amputations on the upper extremity. ?Left foot with compression dressing, immediate postop.  Not removed by me. ?Neuro: Intact. ? ? ? ? ?Data Reviewed: I have personally reviewed following labs and imaging studies ? ?CBC: ?Recent Labs  ?Lab 05/25/21 ?2105 05/26/21 ?4010  05/26/21 ?2148 05/27/21 ?2725 05/28/21 ?3664  ?WBC 11.8* 11.5* 12.9* 13.8*  --   ?HGB 7.2* 7.0* 8.4* 7.9* 8.2*  ?HCT 23.1* 22.6* 26.7* 25.5* 24.0*  ?MCV 98.3 99.6 96.0 95.9  --   ?PLT 252 234 254 254  --   ? ?Basic Metabolic Panel: ?Recent Labs  ?Lab 05/25/21 ?2105 05/26/21 ?4034 05/26/21 ?1500 05/26/21 ?2148 05/28/21 ?0851  ?NA 129* 129* 132* 134* 130*  ?K 4.3 4.0 4.1 4.7 5.2*  ?CL 87* 88* 90* 89* 97*  ?CO2 '30 25 24 28  '$ --   ?GLUCOSE 400* 308* 297* 282* 221*  ?BUN 27* 34* 34* 43* 70*  ?CREATININE 4.67* 5.00* 4.99* 5.85* 7.90*  ?CALCIUM 8.3* 8.0* 8.1* 8.9  --   ?PHOS  --   --  4.0 5.0*  --   ? ?GFR: ?Estimated Creatinine Clearance: 10.5 mL/min (A) (by C-G formula based on SCr of 7.9 mg/dL (H)). ?Liver Function Tests: ?Recent Labs  ?Lab 05/25/21 ?2105 05/26/21 ?7425 05/26/21 ?1500 05/26/21 ?2148  ?AST 22 15  --   --   ?ALT 21 14  --   --   ?ALKPHOS 145* 119  --   --   ?BILITOT 0.7 0.4  --   --   ?PROT 7.7 7.1  --   --   ?ALBUMIN 2.5* 2.4* 2.4* 2.9*  ? ?No results for input(s): LIPASE, AMYLASE in the last 168 hours. ?No results for input(s): AMMONIA in the last 168 hours. ?Coagulation Profile: ?No results for input(s): INR, PROTIME in the last 168 hours. ?Cardiac Enzymes: ?No results for input(s): CKTOTAL, CKMB, CKMBINDEX, TROPONINI in the last 168 hours. ?BNP (last 3 results) ?No results for input(s): PROBNP in the last 8760 hours. ?HbA1C: ?No results for input(s): HGBA1C in the last 72 hours. ?CBG: ?Recent Labs  ?Lab 05/27/21 ?2301 05/28/21 ?9563 05/28/21 ?8756 05/28/21 ?1027 05/28/21 ?1057  ?GLUCAP 273* 194* 229* 184* 187*  ? ?Lipid Profile: ?Recent Labs  ?  05/28/21 ?0155  ?CHOL 158  ?HDL 24*  ?[place] 75  ?TRIG 296*  ?CHOLHDL 6.6  ? ?Thyroid Function Tests: ?No results for input(s): TSH, T4TOTAL, FREET4, T3FREE, THYROIDAB in the last 72 hours. ?Anemia Panel: ?No results for input(s): VITAMINB12, FOLATE, FERRITIN, TIBC, IRON, RETICCTPCT in the last 72 hours. ?Sepsis Labs: ?Recent Labs  ?Lab 05/25/21 ?2105  05/26/21 ?0040  ?LATICACIDVEN 2.4* 1.9  ? ? ?Recent Results (from the past 240 hour(s))  ?Blood culture (routine x 2)     Status: None (Preliminary result)  ? Collection Time: 05/25/21  9:20 PM  ? Specimen: BLOOD LEFT HAND  ?Result Value Ref Range Status  ? Specimen Description BLOOD LEFT HAND  Final  ? Special Requests   Final  ?  BOTTLES DRAWN AEROBIC AND ANAEROBIC Blood Culture adequate volume  ? Culture   Final  ?  NO GROWTH 3 DAYS ?Performed at [facility], [place] [address] [place], [place] [address] ?  ? Report Status PENDING  Incomplete  ?Blood culture (routine x 2)     Status: Abnormal  ? Collection Time: 05/25/21  9:42 PM  ? Specimen: BLOOD  ?Result Value Ref  Range Status  ? Specimen Description BLOOD  Final  ? Special Requests   Final  ?  L EJ BOTTLES DRAWN AEROBIC AND ANAEROBIC Blood Culture adequate volume  ? Culture  Setup Time   Final  ?  GRAM POSITIVE COCCI IN CLUSTERS ?IN BOTH AEROBIC AND ANAEROBIC BOTTLES ?CRITICAL RESULT CALLED TO, READ BACK BY AND VERIFIED WITH: [place]. [id number] FCP ?  ? Culture (A)  Final  ?  STAPHYLOCOCCUS EPIDERMIDIS ?THE SIGNIFICANCE OF ISOLATING THIS ORGANISM FROM A SINGLE SET OF BLOOD CULTURES WHEN MULTIPLE SETS ARE DRAWN IS UNCERTAIN. PLEASE NOTIFY THE MICROBIOLOGY DEPARTMENT WITHIN ONE WEEK IF SPECIATION AND SENSITIVITIES ARE REQUIRED. ?Performed at [facility], [place] [address], [place], [place] [address] ?  ? Report Status 05/28/2021 FINAL  Final  ?Blood Culture ID Panel (Reflexed)     Status: Abnormal  ? Collection Time: 05/25/21  9:42 PM  ?Result Value Ref Range Status  ? Enterococcus faecalis NOT DETECTED NOT DETECTED Final  ? Enterococcus Faecium NOT DETECTED NOT DETECTED Final  ? Listeria monocytogenes NOT DETECTED NOT DETECTED Final  ? Staphylococcus species DETECTED (A) NOT DETECTED Final  ?  Comment: CRITICAL RESULT CALLED TO, READ BACK BY AND VERIFIED WITH: ?[place]. [id number] FCP ?  ? Staphylococcus aureus (BCID) NOT DETECTED NOT  DETECTED Final  ? Staphylococcus epidermidis DETECTED (A) NOT DETECTED Final  ?  Comment: Methicillin (oxacillin) resistant coagulase negative staphylococcus. Possible blood culture contaminant (unless isolated from mor

## 2021-05-28 NOTE — Progress Notes (Signed)
PHARMACY - PHYSICIAN COMMUNICATION ?CRITICAL VALUE ALERT - BLOOD CULTURE IDENTIFICATION (BCID) ? ?[name] is an 56 y.o. female who presented to [facility] on 05/25/2021 with a chief complaint of infected left fifth toe.  ? ?Assessment:  Prior 2 of 4 blood cultures with methicilly resistant stapylococcus epidermidis. Now 1 of 4 blood cultures with Klebsiella aerogenes (no resistance detected).  ? ?Name of physician (or Provider) Contacted: N/A  ? ?Current antibiotics: Cefepime and Vancomycin - renally adjusted for hemodialysis.  ? ?Changes to prescribed antibiotics recommended:  ?Patient is on recommended antibiotics - No changes needed ? ?Results for orders placed or performed during the hospital encounter of 05/25/21  ?Blood Culture ID Panel (Reflexed) (Collected: 05/25/2021  9:20 PM)  ?Result Value Ref Range  ? Enterococcus faecalis NOT DETECTED NOT DETECTED  ? Enterococcus Faecium NOT DETECTED NOT DETECTED  ? Listeria monocytogenes NOT DETECTED NOT DETECTED  ? Staphylococcus species NOT DETECTED NOT DETECTED  ? Staphylococcus aureus (BCID) NOT DETECTED NOT DETECTED  ? Staphylococcus epidermidis NOT DETECTED NOT DETECTED  ? Staphylococcus lugdunensis NOT DETECTED NOT DETECTED  ? Streptococcus species NOT DETECTED NOT DETECTED  ? Streptococcus agalactiae NOT DETECTED NOT DETECTED  ? Streptococcus pneumoniae NOT DETECTED NOT DETECTED  ? Streptococcus pyogenes NOT DETECTED NOT DETECTED  ? A.calcoaceticus-baumannii NOT DETECTED NOT DETECTED  ? Bacteroides fragilis NOT DETECTED NOT DETECTED  ? Enterobacterales DETECTED (A) NOT DETECTED  ? Enterobacter cloacae complex NOT DETECTED NOT DETECTED  ? Escherichia coli NOT DETECTED NOT DETECTED  ? Klebsiella aerogenes DETECTED (A) NOT DETECTED  ? Klebsiella oxytoca NOT DETECTED NOT DETECTED  ? Klebsiella pneumoniae NOT DETECTED NOT DETECTED  ? Proteus species NOT DETECTED NOT DETECTED  ? Salmonella species NOT DETECTED NOT DETECTED  ? Serratia marcescens NOT  DETECTED NOT DETECTED  ? Haemophilus influenzae NOT DETECTED NOT DETECTED  ? Neisseria meningitidis NOT DETECTED NOT DETECTED  ? Pseudomonas aeruginosa NOT DETECTED NOT DETECTED  ? Stenotrophomonas maltophilia NOT DETECTED NOT DETECTED  ? Candida albicans NOT DETECTED NOT DETECTED  ? Candida auris NOT DETECTED NOT DETECTED  ? Candida glabrata NOT DETECTED NOT DETECTED  ? Candida krusei NOT DETECTED NOT DETECTED  ? Candida parapsilosis NOT DETECTED NOT DETECTED  ? Candida tropicalis NOT DETECTED NOT DETECTED  ? Cryptococcus neoformans/gattii NOT DETECTED NOT DETECTED  ? CTX-M ESBL NOT DETECTED NOT DETECTED  ? Carbapenem resistance IMP NOT DETECTED NOT DETECTED  ? Carbapenem resistance KPC NOT DETECTED NOT DETECTED  ? Carbapenem resistance NDM NOT DETECTED NOT DETECTED  ? Carbapenem resist OXA 48 LIKE NOT DETECTED NOT DETECTED  ? Carbapenem resistance VIM NOT DETECTED NOT DETECTED  ? ? ?Brain Hilts ?05/28/2021  7:05 PM ? ?

## 2021-05-28 NOTE — Progress Notes (Addendum)
?[name] ?Progress Note  ? ?Subjective:  Seen in room - s/p L 4th/5th metatarsal amputations + debridement this AM. Denies CP/dyspnea. ? ?Objective ?Vitals:  ? 05/28/21 1025 05/28/21 1027 05/28/21 1030 05/28/21 1038  ?BP: (!) 112/58   107/60  ?Pulse: 98 94 91 91  ?Resp: 20 (!) '22 19 20  '$ ?Temp: 97.7 ?F (36.5 ?C)     ?TempSrc:      ?SpO2: 98% 96% 97% 95%  ?Weight:      ?Height:      ? ?Physical Exam ?General: Well appearing woman, NAD. Nasal O2 in place ?Heart: RRR; no murmur ?Lungs: CTA anteriorly ?Abdomen: soft, non-tender ?Extremities: 1+ BLE edema; L foot bandaged ?Access: RUE AVF + bruit ? ?Additional Objective ?Labs: ?Basic Metabolic Panel: ?Recent Labs  ?Lab 05/26/21 ?4917 05/26/21 ?1500 05/26/21 ?2148 05/28/21 ?0851  ?NA 129* 132* 134* 130*  ?K 4.0 4.1 4.7 5.2*  ?CL 88* 90* 89* 97*  ?CO2 '25 24 28  '$ --   ?GLUCOSE 308* 297* 282* 221*  ?BUN 34* 34* 43* 70*  ?CREATININE 5.00* 4.99* 5.85* 7.90*  ?CALCIUM 8.0* 8.1* 8.9  --   ?PHOS  --  4.0 5.0*  --   ? ?Liver Function Tests: ?Recent Labs  ?Lab 05/25/21 ?2105 05/26/21 ?9150 05/26/21 ?1500 05/26/21 ?2148  ?AST 22 15  --   --   ?ALT 21 14  --   --   ?ALKPHOS 145* 119  --   --   ?BILITOT 0.7 0.4  --   --   ?PROT 7.7 7.1  --   --   ?ALBUMIN 2.5* 2.4* 2.4* 2.9*  ? ?CBC: ?Recent Labs  ?Lab 05/25/21 ?2105 05/26/21 ?5697 05/26/21 ?2148 05/27/21 ?9480 05/28/21 ?1655  ?WBC 11.8* 11.5* 12.9* 13.8*  --   ?HGB 7.2* 7.0* 8.4* 7.9* 8.2*  ?HCT 23.1* 22.6* 26.7* 25.5* 24.0*  ?MCV 98.3 99.6 96.0 95.9  --   ?PLT 252 234 254 254  --   ? ?Blood Culture ?   ?Component Value Date/Time  ? [place] BLOOD 05/25/2021 2142  ? SPECREQUEST  05/25/2021 2142  ?  L EJ BOTTLES DRAWN AEROBIC AND ANAEROBIC Blood Culture adequate volume  ? CULT (A) 05/25/2021 2142  ?  STAPHYLOCOCCUS EPIDERMIDIS ?THE SIGNIFICANCE OF ISOLATING THIS ORGANISM FROM A SINGLE SET OF BLOOD CULTURES WHEN MULTIPLE SETS ARE DRAWN IS UNCERTAIN. PLEASE NOTIFY THE MICROBIOLOGY DEPARTMENT WITHIN ONE WEEK IF SPECIATION  AND SENSITIVITIES ARE REQUIRED. ?Performed at [facility], [place] [address] [place], [place] [address] ?  ? REPTSTATUS 05/28/2021 FINAL 05/25/2021 2142  ? ?Studies/Results: ?PERIPHERAL VASCULAR CATHETERIZATION ? ?Result Date: 05/27/2021 ?Images from the original result were not included. Patient name: [name] MRN: [id number] DOB: 1965/09/21 Sex: female 05/27/2021 Pre-operative Diagnosis: Left lower extremity [place] 5 critical limb ischemia Post-operative diagnosis:  Same Surgeon:  Catherine John, MD Procedure Performed: 1.  Ultrasound-guided micropuncture access of the right common femoral artery 2.  Aortogram 3.  Second-order cannulation, left lower extremity angiogram 4.  Laser atherectomy of the anterior tibial artery 5.  Balloon angioplasty of the anterior tibial artery 6.  Device assisted closure-Mynx Indications: Patient is a 56 year old female who presented to the ED with dry gangrene of the left fifth toe.  ABIs demonstrated monophasic waveforms in the left lower extremity.  After discussing risks and benefits of left lower extremity angiography in an effort to define and improve distal perfusion for wound healing, [name] Findings: Aortogram: Bilateral renal arteries patent, no flow-limiting stenosis. On  the left: Normal common femoral artery, normal profunda, [name] femoral artery with previously placed stents.  These are patent.  Popliteal artery without stenosis.  The posterior tibial artery provides single-vessel inline flow to the foot with filling of medial and lateral plantar arteries.  The peroneal artery occludes in the mid tibia, anterior tibial artery occludes immediately distal to its ostia.  Procedure:  The patient was identified in the holding area and taken to room 8.  The patient was then placed supine on the table and prepped and draped in the usual sterile fashion.  A time out was called.  Ultrasound was used to evaluate the right common femoral artery.  It was patent .   A digital ultrasound image was acquired.  A micropuncture needle was used to access the right common femoral artery under ultrasound guidance.  An 018 wire was advanced without resistance and a micropuncture sheath was placed.  The 018 wire was removed and a benson wire was placed.  The micropuncture sheath was exchanged for a 5 french sheath.  An omniflush catheter was advanced over the wire to the level of L-1.  An abdominal angiogram was obtained.  Next, using the omniflush catheter and a benson wire, the aortic bifurcation was crossed and the catheter was placed into theleft external iliac artery and left runoff was obtained.  right runoff was performed via retrograde sheath injections. Being that the patient has tissue loss on the left lower extremity, I elected to try anterior tibial artery recanalization.  The patient was heparinized.  A 6 x 65 cm sheath was brought onto the field and parked in the superficial femoral artery, from this location, a series of wires and catheters were used to select the anterior tibial artery, traverse the artery, and into the dorsalis pedis in the foot.  True lumen was confirmed with use of angiography.  Next, 0.9 mm laser atherectomy was brought onto the field and an effort to create a larger lumen.  This passed without issue.  Next, a 2.5 x 269m balloon was brought onto the field and expanded from the dorsalis pedis, into the below-knee popliteal artery.  On follow-up angiography, there was no filling of the anterior tibial artery.  The area was ballooned again with a noncompliant J balloon, and again, there was no filling of the anterior tibial artery.  There did not appear to be any thrombus in the lumen, my concern was for dissection flap.  Being that the patient had inline flow to the foot, I elected not to stent the proximal anterior tibial artery. Impression: Unsuccessful attempt at recanalization of the anterior tibial artery using laser atherectomy, balloon  angioplasty. Patient with single-vessel posterior tibial artery runoff to the foot JCassandria Santee MD [facility] [place]: [phone] ? ?VAS [place]ABI WITH/WO TBI ? ?Result Date: 05/26/2021 ? LOWER EXTREMITY DOPPLER STUDY Patient Name:  TMACKENIZE DELGADILLO Date of Exam:   05/26/2021 Medical Rec #: [id number]          Accession #:    [id number] of Birth: 7December 03, 1967           Patient Gender: F Patient Age:   58years [facility]      VAS [place]ABI WITH/WO TBI Referring Phys: MARCUS DUDA --------------------------------------------------------------------------------  Indications: PVD w/ gangrene LE left. High Risk         Hypertension, hyperlipidemia, Diabetes, coronary artery Factors:  disease.  Comparison Study: no prior Performing Technologist: [name] RVS  Examination Guidelines: A complete evaluation includes at minimum, Doppler waveform signals and systolic blood pressure reading at the level of bilateral brachial, anterior tibial, and posterior tibial arteries, when vessel segments are accessible. Bilateral testing is considered an integral part of a complete examination. Photoelectric Plethysmograph (PPG) waveforms and toe systolic pressure readings are included as required and additional duplex testing as needed. Limited examinations for reoccurring indications may be performed as noted.  ABI Findings: +---------+------------------+-----+----------+--------------------------------+ Right    Rt Pressure (mmHg)IndexWaveform  Comment                          +---------+------------------+-----+----------+--------------------------------+ Brachial                                  fistula                          +---------+------------------+-----+----------+--------------------------------+ PTA      79                0.70 monophasic                                  +---------+------------------+-----+----------+--------------------------------+ DP       255               2.26 monophasic                                 +---------+------------------+-----+----------+--------------------------------+ Great Toe                                 u

## 2021-05-28 NOTE — Progress Notes (Signed)
Pt receives out-pt HD at [facility] on MWF. Pt arrives at 10:25 for 10:45 chair time. Will assist as needed.  ? ?Melven Sartorius ?Renal Navigator ?[phone] ?

## 2021-05-28 NOTE — Progress Notes (Addendum)
[facility] ? ?Subjective  - No new changes ? ? ?Objective ?(!) 121/93 ?90 ?98.6 ?F (37 ?C) (Oral) ?18 ?91% ? ?Intake/Output Summary (Last 24 hours) at 05/28/2021 0731 ?Last data filed at 05/28/2021 0600 ?Gross per 24 hour  ?Intake 143.25 ml  ?Output 1608 ml  ?Net -1464.75 ml  ? ? ?Left 5th toe ischemia with malodor ?Lungs non labored breathing ?Heart RRR ? ?Assessment/Planning: ?POD # 1 angio with failed AT intervention ?Now with single vessel runoff PT ?Plan for left 5th toe today with Dr. Virl Cagey ?I placed NPO order this am and patient agrees with the plan ? ? ?[name] ?05/28/2021 ?7:31 AM ?-- ? ?Laboratory ?Lab Results: ?Recent Labs  ?  05/26/21 ?2148 05/27/21 ?0844  ?WBC 12.9* 13.8*  ?HGB 8.4* 7.9*  ?HCT 26.7* 25.5*  ?PLT 254 254  ? ?BMET ?Recent Labs  ?  05/26/21 ?1500 05/26/21 ?2148  ?NA 132* 134*  ?K 4.1 4.7  ?CL 90* 89*  ?CO2 24 28  ?GLUCOSE 297* 282*  ?BUN 34* 43*  ?CREATININE 4.99* 5.85*  ?CALCIUM 8.1* 8.9  ? ? ?COAG ?Lab Results  ?Component Value Date  ? INR 1.0 11/14/2018  ? INR 0.90 01/30/2011  ? ?No results found for: PTT ? ?I have independently interviewed and examined patient and agree with PA assessment and plan above. Plan for amputation of at least left small toe possible adjacent toe or 2 and debridement of left foot.  ? ?Shanique Aslinger C. Donzetta Matters, MD ?[facility] ?Office: [phone] ?Pager: [phone] ? ? ?

## 2021-05-29 ENCOUNTER — Inpatient Hospital Stay: Payer: Self-pay

## 2021-05-29 ENCOUNTER — Encounter (HOSPITAL_COMMUNITY): Payer: Self-pay | Admitting: Vascular Surgery

## 2021-05-29 DIAGNOSIS — Z992 Dependence on renal dialysis: Secondary | ICD-10-CM | POA: Diagnosis not present

## 2021-05-29 DIAGNOSIS — N186 End stage renal disease: Secondary | ICD-10-CM | POA: Diagnosis not present

## 2021-05-29 DIAGNOSIS — D631 Anemia in chronic kidney disease: Secondary | ICD-10-CM | POA: Diagnosis not present

## 2021-05-29 DIAGNOSIS — M86272 Subacute osteomyelitis, left ankle and foot: Secondary | ICD-10-CM | POA: Diagnosis not present

## 2021-05-29 LAB — GLUCOSE, CAPILLARY
Glucose-Capillary: 127 mg/dL — ABNORMAL HIGH (ref 70–99)
Glucose-Capillary: 212 mg/dL — ABNORMAL HIGH (ref 70–99)
Glucose-Capillary: 307 mg/dL — ABNORMAL HIGH (ref 70–99)

## 2021-05-29 LAB — RENAL FUNCTION PANEL
Albumin: 2.6 g/dL — ABNORMAL LOW (ref 3.5–5.0)
Anion gap: 19 — ABNORMAL HIGH (ref 5–15)
BUN: 80 mg/dL — ABNORMAL HIGH (ref 6–20)
CO2: 19 mmol/L — ABNORMAL LOW (ref 22–32)
Calcium: 8.7 mg/dL — ABNORMAL LOW (ref 8.9–10.3)
Chloride: 89 mmol/L — ABNORMAL LOW (ref 98–111)
Creatinine, Ser: 8.51 mg/dL — ABNORMAL HIGH (ref 0.44–1.00)
GFR, Estimated: 5 mL/min — ABNORMAL LOW (ref >60)
Glucose, Bld: 207 mg/dL — ABNORMAL HIGH (ref 70–99)
Phosphorus: 5 mg/dL — ABNORMAL HIGH (ref 2.5–4.6)
Potassium: 5.7 mmol/L — ABNORMAL HIGH (ref 3.5–5.1)
Sodium: 127 mmol/L — ABNORMAL LOW (ref 135–145)

## 2021-05-29 LAB — HEPATITIS B SURFACE ANTIBODY, QUANTITATIVE: Hep B S AB Quant (Post): 19.2 m[IU]/mL (ref >9.9)

## 2021-05-29 LAB — CBC
HCT: 24.2 % — ABNORMAL LOW (ref 36.0–46.0)
HCT: 23.3 % — ABNORMAL LOW (ref 36.0–46.0)
Hemoglobin: 7.8 g/dL — ABNORMAL LOW (ref 12.0–15.0)
Hemoglobin: 7.6 g/dL — ABNORMAL LOW (ref 12.0–15.0)
MCH: 30.4 pg (ref 26.0–34.0)
MCH: 30.3 pg (ref 26.0–34.0)
MCHC: 32.2 g/dL (ref 30.0–36.0)
MCHC: 32.6 g/dL (ref 30.0–36.0)
MCV: 94.2 fL (ref 80.0–100.0)
MCV: 92.8 fL (ref 80.0–100.0)
Platelets: 250 10*3/uL (ref 150–400)
Platelets: 237 10*3/uL (ref 150–400)
RBC: 2.57 MIL/uL — ABNORMAL LOW (ref 3.87–5.11)
RBC: 2.51 MIL/uL — ABNORMAL LOW (ref 3.87–5.11)
RDW: 15 % (ref 11.5–15.5)
RDW: 15.1 % (ref 11.5–15.5)
WBC: 15 10*3/uL — ABNORMAL HIGH (ref 4.0–10.5)
WBC: 15.6 10*3/uL — ABNORMAL HIGH (ref 4.0–10.5)
nRBC: 0 % (ref 0.0–0.2)
nRBC: 0 % (ref 0.0–0.2)

## 2021-05-29 MED ORDER — SODIUM CHLORIDE 0.9 % IV SOLN
2.0000 g | Freq: Once | INTRAVENOUS | Status: AC
  Administered 2021-05-29: 2 g via INTRAVENOUS
  Filled 2021-05-29: qty 12.5

## 2021-05-29 MED ORDER — ALBUMIN HUMAN 25 % IV SOLN
25.0000 g | Freq: Once | INTRAVENOUS | Status: AC
  Administered 2021-05-29: 25 g via INTRAVENOUS
  Filled 2021-05-29: qty 100

## 2021-05-29 MED ORDER — METOPROLOL SUCCINATE ER 50 MG PO TB24
50.0000 mg | ORAL_TABLET | Freq: Every day | ORAL | Status: DC
  Administered 2021-05-29 – 2021-06-02 (×5): 50 mg via ORAL
  Filled 2021-05-29 (×5): qty 1

## 2021-05-29 MED ORDER — MIDODRINE HCL 5 MG PO TABS
10.0000 mg | ORAL_TABLET | Freq: Once | ORAL | Status: AC
  Administered 2021-05-29: 10 mg via ORAL

## 2021-05-29 MED ORDER — MIDODRINE HCL 5 MG PO TABS
ORAL_TABLET | ORAL | Status: AC
  Filled 2021-05-29: qty 2

## 2021-05-29 NOTE — Progress Notes (Addendum)
?  ? ? ?  Subjective  - Feels cold, not much pain. ? ? ?Objective ?(!) 112/59 ?87 ?98.1 ?F (36.7 ?C) (Oral) ?20 ?90% ?No intake or output data in the 24 hours ending 05/29/21 2542 ? ?Left foot dressing intact clean and dry ?Right groin soft ?Lungs non labored  ? ? ? ?Assessment/Planning: ?Left LE ischemia s/p 4th/5th toe amputation ? ?No re vascularization options.  She is at high risk of BKA. ?Dressing clean and dry ?Plan to change dressing tomorrow ?WBC leukocytosis, Afebrile.  Blood cultures Staph aureus, Enterobacterials, Klebsiella and epidermidis detected.  Currently on Cefepime and Vanc.   ? ? ? ?Roxy Horseman ?05/29/2021 ?7:12 AM ?-- ? ?Laboratory ?Lab Results: ?Recent Labs  ?  05/26/21 ?2148 05/27/21 ?7062 05/28/21 ?3762  ?WBC 12.9* 13.8*  --   ?HGB 8.4* 7.9* 8.2*  ?HCT 26.7* 25.5* 24.0*  ?PLT 254 254  --   ? ?BMET ?Recent Labs  ?  05/26/21 ?1500 05/26/21 ?2148 05/28/21 ?0851  ?NA 132* 134* 130*  ?K 4.1 4.7 5.2*  ?CL 90* 89* 97*  ?CO2 24 28  --   ?GLUCOSE 297* 282* 221*  ?BUN 34* 43* 70*  ?CREATININE 4.99* 5.85* 7.90*  ?CALCIUM 8.1* 8.9  --   ? ? ?COAG ?Lab Results  ?Component Value Date  ? INR 1.0 11/14/2018  ? INR 0.90 01/30/2011  ? ?No results found for: PTT ? ?I have independently interviewed and examined patient and agree with PA assessment and plan above.  We will evaluate this weekend and Monday possibly will need below-knee amputation. ? ?Shantese Raven C. Donzetta Matters, MD ?[facility] ?Office: [phone] ?Pager: [phone] ? ? ?

## 2021-05-29 NOTE — Telephone Encounter (Signed)
Message routed to Dr. Lamonte Sakai for [place]. Nothing further needed at this time.  ?

## 2021-05-29 NOTE — Progress Notes (Signed)
removed 2969ms net fluid unable to remove more due to hypotension as low as 668/864systolic.  gave 10 mg midodrine, 25 grams albuming for bp support ran on cold temperature and uf profile 4.  gave vancomycin and cefipime as ordered. cefipine does dialyze out, spoke to pharmacy as she has no other peripheral access and he stated he would rather her get some then none.  also gave oxycodone for severe back pain.  2 bandages to ruba avf no bleeding dressing cdi.  pt bent arm many times during treatment and stopped flow some clotting in system gave saline flushed to prevent clotting. ?

## 2021-05-29 NOTE — Progress Notes (Signed)
?PROGRESS NOTE ? ? ? ?[name]  OBS:[id number] DOB: September 06, 1965 DOA: 05/25/2021 ?PCP: Sandi Mariscal, MD  ? ? 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  She had a chronic wound on her left fifth toe and last 3 days it has been bleeding and draining.  In the emergency room temperature 101.8, tachycardia 140, blood pressure stable.  On 4 L oxygen.  Hemoglobin 7.2.  Lactic acid 2.4.  Left foot x-ray with soft tissue swelling and osteomyelitis fifth phalanx.  Started on antibiotics, orthopedics , vascular and nephrology consulted. ?Fourth and fifth metatarsal amputation 4/27.  Unhealthy base. ? ?Assessment & Plan: ?  ?Sepsis present on admission secondary to left foot osteomyelitis, diabetic foot ulcer with osteomyelitis: ?Blood cultures with Staph epidermidis 1 out of 4 bottles that is MRSA ?Enterobacter aerogenes likely Klebsiella. ?Remains on vancomycin and cefepime.  Continue.  We will repeat blood cultures tomorrow. ?4/26, angiography, posterior tibial runoff.  Balloon dilatation. ?4/27, fourth and fifth toe ray amputation, trying to preserve the leg, however high risk of transtibial amputation as per surgery.   ?Patient agreeable for transtibial amputation if no chance of healing or complicated postop course. ?I updated surgery. ? ?Essential hypertension: Blood pressure stable.  Home metoprolol. ? ?Hyperlipidemia: On statin. ? ?Rheumatoid arthritis: Takes Humira at home.  On hold due to acute infection. ? ?Anemia of chronic disease: Received 1 unit of PRBC with dialysis 4/25.  Hemoglobin is 7.6 ? ?ESRD on hemodialysis:  ?Receiving multiple hemodialysis to try to optimize volume status.  Difficult to tolerate.   ? ?Type 2 diabetes, poorly  controlled with hyperglycemia: ?On home dose of insulin.  Stable today. ? ?Morbid obesity: BMI more than 40. ? ? ? ?DVT prophylaxis:   SCDs ? ? ?Code Status: Full code ?Family Communication: None ?Disposition Plan: Status is: Inpatient.  Remains inpatient, inpatient surgical procedures, hemodialysis. ? ? ?Consultants:  ?Orthopedics ?Nephrology ?Vascular surgery ? ?Procedures:  ?None ? ?Antimicrobials:  ?Vancomycin and cefepime 4/24--- ? ? ?Subjective: ? ?Patient seen and examined.  She is getting hemodialysis.  Sleepy today.  Denies any complaints. ? ? ? ?Objective: ?Vitals:  ? 05/29/21 1000 05/29/21 1030 05/29/21 1100 05/29/21 1214  ?BP: (!) 104/44 (!) 104/30 (!) 92/41 (!) 101/50  ?Pulse: 85 78 80 84  ?Resp:    20  ?Temp:      ?TempSrc:      ?SpO2:    97%  ?Weight:      ?Height:      ? ?No intake or output data in the 24 hours ending 05/29/21 1226 ? ?Filed Weights  ? 05/27/21 0500 05/28/21 0604 05/29/21 0723  ?Weight: 119 kg 113.4 kg 114.7 kg  ? ? ?Examination: ? ?General: Chronically sick looking.  Frail and debilitated.  Irregular breathing pattern noted when sleeping.  Denies sleep apnea. ?Cardiovascular: S1-S2 normal.  Regular rate rhythm. ?Respiratory: Bilateral clear.  No added sounds. ?Gastrointestinal: Soft.  Nontender.  Obese and pendulous. ?Ext: Chronic ischemic changes.  Multiple digital amputations on the upper extremity. ?Left foot with compression dressing, immediate postop.  Not removed by me. ?Neuro: Intact. ? ? ? ? ?Data Reviewed: I have personally reviewed following labs and imaging studies ? ?CBC: ?Recent Labs  ?Lab 05/26/21 ?4765 05/26/21 ?2148 05/27/21 ?4650 05/28/21 ?3546 05/29/21 ?5681 05/29/21 ?0747  ?WBC  11.5* 12.9* 13.8*  --  15.0* 15.6*  ?HGB 7.0* 8.4* 7.9* 8.2* 7.8* 7.6*  ?HCT 22.6* 26.7* 25.5* 24.0* 24.2* 23.3*  ?MCV 99.6 96.0 95.9  --  94.2 92.8  ?PLT 234 254 254  --  250 237  ? ?Basic Metabolic Panel: ?Recent Labs  ?Lab 05/25/21 ?2105 05/26/21 ?6270 05/26/21 ?1500 05/26/21 ?2148  05/28/21 ?3500 05/29/21 ?0747  ?NA 129* 129* 132* 134* 130* 127*  ?K 4.3 4.0 4.1 4.7 5.2* 5.7*  ?CL 87* 88* 90* 89* 97* 89*  ?CO2 '30 25 24 28  '$ --  19*  ?GLUCOSE 400* 308* 297* 282* 221* 207*  ?BUN 27* 34* 34* 43* 70* 80*  ?CREATININE 4.67* 5.00* 4.99* 5.85* 7.90* 8.51*  ?CALCIUM 8.3* 8.0* 8.1* 8.9  --  8.7*  ?PHOS  --   --  4.0 5.0*  --  5.0*  ? ?GFR: ?Estimated Creatinine Clearance: 9.8 mL/min (A) (by C-G formula based on SCr of 8.51 mg/dL (H)). ?Liver Function Tests: ?Recent Labs  ?Lab 05/25/21 ?2105 05/26/21 ?9381 05/26/21 ?1500 05/26/21 ?2148 05/29/21 ?0747  ?AST 22 15  --   --   --   ?ALT 21 14  --   --   --   ?ALKPHOS 145* 119  --   --   --   ?BILITOT 0.7 0.4  --   --   --   ?PROT 7.7 7.1  --   --   --   ?ALBUMIN 2.5* 2.4* 2.4* 2.9* 2.6*  ? ?No results for input(s): LIPASE, AMYLASE in the last 168 hours. ?No results for input(s): AMMONIA in the last 168 hours. ?Coagulation Profile: ?No results for input(s): INR, PROTIME in the last 168 hours. ?Cardiac Enzymes: ?No results for input(s): CKTOTAL, CKMB, CKMBINDEX, TROPONINI in the last 168 hours. ?BNP (last 3 results) ?No results for input(s): PROBNP in the last 8760 hours. ?HbA1C: ?No results for input(s): HGBA1C in the last 72 hours. ?CBG: ?Recent Labs  ?Lab 05/28/21 ?0833 05/28/21 ?1027 05/28/21 ?1057 05/28/21 ?1617 05/28/21 ?2103  ?GLUCAP 229* 184* 187* 200* 243*  ? ?Lipid Profile: ?Recent Labs  ?  05/28/21 ?0155  ?CHOL 158  ?HDL 24*  ?Grand 75  ?TRIG 296*  ?CHOLHDL 6.6  ? ?Thyroid Function Tests: ?No results for input(s): TSH, T4TOTAL, FREET4, T3FREE, THYROIDAB in the last 72 hours. ?Anemia Panel: ?No results for input(s): VITAMINB12, FOLATE, FERRITIN, TIBC, IRON, RETICCTPCT in the last 72 hours. ?Sepsis Labs: ?Recent Labs  ?Lab 05/25/21 ?2105 05/26/21 ?0040  ?LATICACIDVEN 2.4* 1.9  ? ? ?Recent Results (from the past 240 hour(s))  ?Blood culture (routine x 2)     Status: Abnormal (Preliminary result)  ? Collection Time: 05/25/21  9:20 PM  ? Specimen: BLOOD  LEFT HAND  ?Result Value Ref Range Status  ? Specimen Description BLOOD LEFT HAND  Final  ? Special Requests   Final  ?  BOTTLES DRAWN AEROBIC AND ANAEROBIC Blood Culture adequate volume  ? Culture  Setup Time   Final  ?  GRAM NEGATIVE RODS ?AEROBIC BOTTLE ONLY ?CRITICAL RESULT CALLED TO, READ BACK BY AND VERIFIED WITH: PHARMD JESSICA MILLEN ON 05/28/21 @ 1900 BY DRT ?  ? Culture (A)  Final  ?  ENTEROBACTER AEROGENES ?SUSCEPTIBILITIES TO FOLLOW ?Performed at [facility], [place] [address], [place], [place] [address] ?  ? Report Status PENDING  Incomplete  ?Blood Culture ID Panel (Reflexed)     Status: Abnormal  ? Collection Time: 05/25/21  9:20 PM  ?Result Value Ref  Range Status  ? Enterococcus faecalis NOT DETECTED NOT DETECTED Final  ? Enterococcus Faecium NOT DETECTED NOT DETECTED Final  ? Listeria monocytogenes NOT DETECTED NOT DETECTED Final  ? Staphylococcus species NOT DETECTED NOT DETECTED Final  ? Staphylococcus aureus (BCID) NOT DETECTED NOT DETECTED Final  ? Staphylococcus epidermidis NOT DETECTED NOT DETECTED Final  ? Staphylococcus lugdunensis NOT DETECTED NOT DETECTED Final  ? Streptococcus species NOT DETECTED NOT DETECTED Final  ? Streptococcus agalactiae NOT DETECTED NOT DETECTED Final  ? Streptococcus pneumoniae NOT DETECTED NOT DETECTED Final  ? Streptococcus pyogenes NOT DETECTED NOT DETECTED Final  ? A.calcoaceticus-baumannii NOT DETECTED NOT DETECTED Final  ? Bacteroides fragilis NOT DETECTED NOT DETECTED Final  ? Enterobacterales DETECTED (A) NOT DETECTED Final  ?  Comment: Enterobacterales represent a large order of gram negative bacteria, not a single organism. ?CRITICAL RESULT CALLED TO, READ BACK BY AND VERIFIED WITH: ?PHARMD JESSICA MILLEN ON 05/28/21 @ 1900 BY DRT ?  ? Enterobacter cloacae complex NOT DETECTED NOT DETECTED Final  ? Escherichia coli NOT DETECTED NOT DETECTED Final  ? Klebsiella aerogenes DETECTED (A) NOT DETECTED Final  ?  Comment: CRITICAL RESULT CALLED TO, READ BACK  BY AND VERIFIED WITH: ?PHARMD JESSICA MILLEN ON 05/28/21 @ 1900 BY DRT ?  ? Klebsiella oxytoca NOT DETECTED NOT DETECTED Final  ? Klebsiella pneumoniae NOT DETECTED NOT DETECTED Final  ? Proteus species NOT DETEC

## 2021-05-29 NOTE — Progress Notes (Signed)
Patient lost her peripheral access consequently unable to receive ordered IV antibiotic.Subsequent attempts to place a peripheral IV with the IV team has been unsuccessful. On-call physician made aware of loss of access.  ?

## 2021-05-29 NOTE — Care Management Important Message (Signed)
Important Message ? ?Patient Details  ?Name: [name] ?MRN: [id number] ?Date of Birth: 09/07/1965 ? ? ?Medicare Important Message Given:  Yes ? ? ? ? ?Shelda Altes ?05/29/2021, 7:42 AM ?

## 2021-05-29 NOTE — Progress Notes (Signed)
CBG 127 

## 2021-05-29 NOTE — Progress Notes (Signed)
Pharmacy Antibiotic Note ? ?[name] is a 56 y.o. female admitted on 05/25/2021 with  wound infection/bacteremia .  Pharmacy has been consulted for cefepime and vancomycin dosing. ? ?On schedule HD MWF, currently in HD, vanc loading dose given in ED 4/24 ? ?Patient growing Klebsiella and 2/4 MRSE in blood. Patient lost PIV access on 1/27 and IV team unable to obtain per floor RN. Therefore, patient missed cefepime dose on 1/27. Have asked HD RN to give cefepime 2gm with HD today and will reschedule cefepime 1gm q24h for 48 hours from today's dose (4/30). Patient will also receive vancomycin in HD today. Will need to follow up IV access issues ? ?Plan: ?Vancomycin 1g IV q MWF in HD ?Cefepime 2gm IV x 1 today in HD then Continue cefepime 1g IV q 24h on 4/30 if PIV access is obtained ?Monitor Cx results, VVS and ortho intervention plan to narrow ?Vancomycin random level as needed ? ?Height: '5\' 8"'$  (172.7 cm) ?Weight: 114.7 kg (252 lb 13.9 oz) ?IBW/kg (Calculated) : 63.9 ? ?Temp (24hrs), Avg:97.9 ?F (36.6 ?C), Min:97.7 ?F (36.5 ?C), Max:98.1 ?F (36.7 ?C) ? ?Recent Labs  ?Lab 05/25/21 ?2105 05/26/21 ?0040 05/26/21 ?0867 05/26/21 ?1500 05/26/21 ?2148 05/27/21 ?6195 05/28/21 ?0932 05/29/21 ?6712 05/29/21 ?0747  ?WBC 11.8*  --  11.5*  --  12.9* 13.8*  --  15.0* 15.6*  ?CREATININE 4.67*  --  5.00* 4.99* 5.85*  --  7.90*  --  8.51*  ?LATICACIDVEN 2.4* 1.9  --   --   --   --   --   --   --   ? ?  ?Estimated Creatinine Clearance: 9.8 mL/min (A) (by C-G formula based on SCr of 8.51 mg/dL (H)).   ? ?Allergies  ?Allergen Reactions  ? Bupropion Itching  ? Dilaudid [Hydromorphone] Other (See Comments)  ?  Apnea, required intubation  ? ? ?Antimicrobials this admission: ?Cefepime 4/24 >>  ?Vancomycin 4/24 >>  ? ?Dose adjustments this admission: ? ? ?Microbiology results: ?4/24 BCx: 2/4 staph epi, klebsiella aerogenes 1/4 ? ?[name] A. Levada Dy, PharmD, BCPS, FNKF ?Clinical Pharmacist ?[facility] ?Please utilize Amion for  appropriate phone number to reach the unit pharmacist ([facility]) ? ?05/29/2021 8:41 AM ? ? ? ?

## 2021-05-29 NOTE — Procedures (Signed)
Patient was seen on dialysis and the procedure was supervised.  BFR 350  Via AVF BP is  98/51. ?She has fluid on+, goal UF 4 kg.  ?I will order a dose of midodrine 10 mg and lower dialysate temp to 36 for intra-dialytic hypotension. D/w RN. ? Patient appears to be tolerating treatment well. ? ?[name] ?05/29/2021 ? ?

## 2021-05-29 NOTE — Progress Notes (Addendum)
?[facility] ?Progress Note  ? ?Subjective: S/P amputation of L 4th and 5th toes 05/28/2021. At high risk for BKA. VVS following. On HD tolerating without issues. NO C/Os.  ? ?Objective ?Vitals:  ? 05/29/21 0830 05/29/21 0900 05/29/21 0930 05/29/21 0952  ?BP: (!) 113/59 (!) 98/51 (!) 90/52 (!) 74/37  ?Pulse: 95 93 88 70  ?Resp:      ?Temp:      ?TempSrc:      ?SpO2:      ?Weight:      ?Height:      ? ?Physical Exam ?General: Chronically ill appearing obese female in NAD ?Heart: S1,S2 RRR No M/R/G  ?Lungs: CTAB Anteriorly. No WOB ?Abdomen: Obese, NABS ?Extremities: 1+ Bilateral LE edema. Woody appearance LE. ACE wrap L foot.  ?Dialysis Access: R AVF Cannulated at present ? ? ?Additional Objective ?Labs: ?Basic Metabolic Panel: ?Recent Labs  ?Lab 05/26/21 ?1500 05/26/21 ?2148 05/28/21 ?1779 05/29/21 ?0747  ?NA 132* 134* 130* 127*  ?K 4.1 4.7 5.2* 5.7*  ?CL 90* 89* 97* 89*  ?CO2 24 28  --  19*  ?GLUCOSE 297* 282* 221* 207*  ?BUN 34* 43* 70* 80*  ?CREATININE 4.99* 5.85* 7.90* 8.51*  ?CALCIUM 8.1* 8.9  --  8.7*  ?PHOS 4.0 5.0*  --  5.0*  ? ?Liver Function Tests: ?Recent Labs  ?Lab 05/25/21 ?2105 05/26/21 ?3903 05/26/21 ?1500 05/26/21 ?2148 05/29/21 ?0747  ?AST 22 15  --   --   --   ?ALT 21 14  --   --   --   ?ALKPHOS 145* 119  --   --   --   ?BILITOT 0.7 0.4  --   --   --   ?PROT 7.7 7.1  --   --   --   ?ALBUMIN 2.5* 2.4* 2.4* 2.9* 2.6*  ? ?No results for input(s): LIPASE, AMYLASE in the last 168 hours. ?CBC: ?Recent Labs  ?Lab 05/26/21 ?0092 05/26/21 ?2148 05/27/21 ?3300 05/28/21 ?7622 05/29/21 ?6333 05/29/21 ?0747  ?WBC 11.5* 12.9* 13.8*  --  15.0* 15.6*  ?HGB 7.0* 8.4* 7.9* 8.2* 7.8* 7.6*  ?HCT 22.6* 26.7* 25.5* 24.0* 24.2* 23.3*  ?MCV 99.6 96.0 95.9  --  94.2 92.8  ?PLT 234 254 254  --  250 237  ? ?Blood Culture ?   ?Component Value Date/Time  ? [place] BLOOD 05/25/2021 2142  ? SPECREQUEST  05/25/2021 2142  ?  L EJ BOTTLES DRAWN AEROBIC AND ANAEROBIC Blood Culture adequate volume  ? CULT (A) 05/25/2021  2142  ?  STAPHYLOCOCCUS EPIDERMIDIS ?THE SIGNIFICANCE OF ISOLATING THIS ORGANISM FROM A SINGLE SET OF BLOOD CULTURES WHEN MULTIPLE SETS ARE DRAWN IS UNCERTAIN. PLEASE NOTIFY THE MICROBIOLOGY DEPARTMENT WITHIN ONE WEEK IF SPECIATION AND SENSITIVITIES ARE REQUIRED. ?Performed at [facility], [place] [address], [place], [place] [address] ?  ? REPTSTATUS 05/28/2021 FINAL 05/25/2021 2142  ? ? ?Cardiac Enzymes: ?No results for input(s): CKTOTAL, CKMB, CKMBINDEX, TROPONINI in the last 168 hours. ?CBG: ?Recent Labs  ?Lab 05/28/21 ?0833 05/28/21 ?1027 05/28/21 ?1057 05/28/21 ?1617 05/28/21 ?2103  ?GLUCAP 229* 184* 187* 200* 243*  ? ?Iron Studies: No results for input(s): IRON, TIBC, TRANSFERRIN, FERRITIN in the last 72 hours. ?'@lablastinr3'$ @ ?Studies/Results: ?PERIPHERAL VASCULAR CATHETERIZATION ? ?Result Date: 05/27/2021 ?Images from the original result were not included. Patient name: [name] MRN: [id number] DOB: 1965-03-25 Sex: female 05/27/2021 Pre-operative Diagnosis: Left lower extremity [place] 5 critical limb ischemia Post-operative diagnosis:  Same Surgeon:  Broadus John, MD Procedure  Performed: 1.  Ultrasound-guided micropuncture access of the right common femoral artery 2.  Aortogram 3.  Second-order cannulation, left lower extremity angiogram 4.  Laser atherectomy of the anterior tibial artery 5.  Balloon angioplasty of the anterior tibial artery 6.  Device assisted closure-Mynx Indications: Patient is a 56 year old female who presented to the ED with dry gangrene of the left fifth toe.  ABIs demonstrated monophasic waveforms in the left lower extremity.  After discussing risks and benefits of left lower extremity angiography in an effort to define and improve distal perfusion for wound healing, [place] Findings: Aortogram: Bilateral renal arteries patent, no flow-limiting stenosis. On the left: Normal common femoral artery, normal profunda, [place] femoral artery with previously placed stents.   These are patent.  Popliteal artery without stenosis.  The posterior tibial artery provides single-vessel inline flow to the foot with filling of medial and lateral plantar arteries.  The peroneal artery occludes in the mid tibia, anterior tibial artery occludes immediately distal to its ostia.  Procedure:  The patient was identified in the holding area and taken to room 8.  The patient was then placed supine on the table and prepped and draped in the usual sterile fashion.  A time out was called.  Ultrasound was used to evaluate the right common femoral artery.  It was patent .  A digital ultrasound image was acquired.  A micropuncture needle was used to access the right common femoral artery under ultrasound guidance.  An 018 wire was advanced without resistance and a micropuncture sheath was placed.  The 018 wire was removed and a benson wire was placed.  The micropuncture sheath was exchanged for a 5 french sheath.  An omniflush catheter was advanced over the wire to the level of L-1.  An abdominal angiogram was obtained.  Next, using the omniflush catheter and a benson wire, the aortic bifurcation was crossed and the catheter was placed into theleft external iliac artery and left runoff was obtained.  right runoff was performed via retrograde sheath injections. Being that the patient has tissue loss on the left lower extremity, I elected to try anterior tibial artery recanalization.  The patient was heparinized.  A 6 x 65 cm sheath was brought onto the field and parked in the superficial femoral artery, from this location, a series of wires and catheters were used to select the anterior tibial artery, traverse the artery, and into the dorsalis pedis in the foot.  True lumen was confirmed with use of angiography.  Next, 0.9 mm laser atherectomy was brought onto the field and an effort to create a larger lumen.  This passed without issue.  Next, a 2.5 x 242m balloon was brought onto the field and expanded from  the dorsalis pedis, into the below-knee popliteal artery.  On follow-up angiography, there was no filling of the anterior tibial artery.  The area was ballooned again with a noncompliant J balloon, and again, there was no filling of the anterior tibial artery.  There did not appear to be any thrombus in the lumen, my concern was for dissection flap.  Being that the patient had inline flow to the foot, I elected not to stent the proximal anterior tibial artery. Impression: Unsuccessful attempt at recanalization of the anterior tibial artery using laser atherectomy, balloon angioplasty. Patient with single-vessel posterior tibial artery runoff to the foot JCassandria Santee MD [facility] GBellevilleOffice: [phone]  ?Medications: ? sodium chloride    ? ceFEPime (MAXIPIME)  IV 1 g (05/27/21 2359)  ? ceFEPime (MAXIPIME) IV    ? vancomycin Stopped (05/27/21 1228)  ? ? Chlorhexidine Gluconate Cloth  6 each Topical Q0600  ? Chlorhexidine Gluconate Cloth  6 each Topical Q0600  ? clopidogrel  75 mg Oral Q breakfast  ? insulin aspart  0-5 Units Subcutaneous QHS  ? insulin aspart  0-6 Units Subcutaneous TID WC  ? insulin aspart protamine- aspart  35 Units Subcutaneous BID WC  ? metoprolol succinate  100 mg Oral Daily  ? midodrine      ? rosuvastatin  40 mg Oral Daily  ? sodium chloride flush  3 mL Intravenous Q12H  ? sodium chloride flush  3 mL Intravenous Q12H  ? ? ? ?Dialysis Orders: ?MWF  - GOC ?4hrs, BFR 450, DFR 800,  EDW 110.6kg, 2K/ 2.5Ca, RU AVF,  ?-Heparin 10000 unit IV bolus TIW ? - No ESA 2/2 ?malignancy (no diagnosis - has stable renal lesion, pulm lesions -> saw onc 4/17, plan is for bronchoscopy with Bx of lung nodules in near future) ?- Calcitriol 2.5 mcg PO qHD ?- Parsabiv 7.5 mg IV qHD  ?  ?Assessment/Plan: ? 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  S/p balloon angioplasty and lasert atherectomy to LATA 4/26, then  amputation of L 4th/5th metarsals 05/28/2021 Dr. Donzetta Matters. At high risk for L BKA per VVS note.  1 of 2 Blood Cx + for MRSA. On Vanc/Cefepime. ?Volume overload: S/p extra HD 4/25, now back on usual schedule.Cont

## 2021-05-30 DIAGNOSIS — Z992 Dependence on renal dialysis: Secondary | ICD-10-CM | POA: Diagnosis not present

## 2021-05-30 DIAGNOSIS — M86272 Subacute osteomyelitis, left ankle and foot: Secondary | ICD-10-CM | POA: Diagnosis not present

## 2021-05-30 DIAGNOSIS — D631 Anemia in chronic kidney disease: Secondary | ICD-10-CM | POA: Diagnosis not present

## 2021-05-30 DIAGNOSIS — N186 End stage renal disease: Secondary | ICD-10-CM | POA: Diagnosis not present

## 2021-05-30 LAB — CULTURE, BLOOD (ROUTINE X 2): Special Requests: ADEQUATE

## 2021-05-30 LAB — BPAM RBC
Blood Product Expiration Date: 202305092359
Blood Product Expiration Date: 202305092359
ISSUE DATE / TIME: 202304251423
ISSUE DATE / TIME: 202304251423
Unit Type and Rh: 6200
Unit Type and Rh: 6200

## 2021-05-30 LAB — GLUCOSE, CAPILLARY
Glucose-Capillary: 225 mg/dL — ABNORMAL HIGH (ref 70–99)
Glucose-Capillary: 223 mg/dL — ABNORMAL HIGH (ref 70–99)
Glucose-Capillary: 221 mg/dL — ABNORMAL HIGH (ref 70–99)
Glucose-Capillary: 201 mg/dL — ABNORMAL HIGH (ref 70–99)

## 2021-05-30 LAB — TYPE AND SCREEN
ABO/RH(D): A POS
Antibody Screen: NEGATIVE
Unit division: 0
Unit division: 0

## 2021-05-30 NOTE — Progress Notes (Signed)
?PROGRESS NOTE ? ? ? ?[name]  XAJ:[id number] DOB: Dec 12, 1965 DOA: 05/25/2021 ?PCP: Sandi Mariscal, MD  ? ? 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  She had a chronic wound on her left fifth toe and last 3 days it has been bleeding and draining.  In the emergency room temperature 101.8, tachycardia 140, blood pressure stable.  On 4 L oxygen.  Hemoglobin 7.2.  Lactic acid 2.4.  Left foot x-ray with soft tissue swelling and osteomyelitis fifth phalanx.  Started on antibiotics, orthopedics , vascular and nephrology consulted. ?Fourth and fifth metatarsal amputation 4/27. ? ?Assessment & Plan: ?  ?Sepsis present on admission secondary to left foot osteomyelitis, diabetic foot ulcer with osteomyelitis: ? ?Blood cultures with Staph epidermidis 1 out of 4 bottles that is MRSA this is likely contaminant. ?Enterobacter aerogenes , on cefepime. ?Remains on vancomycin and cefepime.  Continue.  We will repeat blood cultures today. ?4/26, angiography, posterior tibial runoff.  Balloon dilatation. ?4/27, fourth and fifth toe ray amputation, trying to preserve the leg, however high risk of transtibial amputation. ?Patient agreeable for transtibial amputation if no chance of healing or complicated postop course. ? ?Essential hypertension: Blood pressure stable.  Home metoprolol. ? ?Hyperlipidemia: On statin. ? ?Rheumatoid arthritis: Takes Humira at home.  On hold due to acute infection. ? ?Anemia of chronic disease: Received 1 unit of PRBC with dialysis 4/25.  Hemoglobin is 7.6 ? ?ESRD on hemodialysis:  ?Receiving multiple hemodialysis to try to optimize volume status.  Next Allises Monday. ? ?Type 2 diabetes, poorly controlled with hyperglycemia: ?On home  dose of insulin.  Stable today. ? ?Morbid obesity: BMI more than 40. ? ? ? ?DVT prophylaxis:   SCDs ? ? ?Code Status: Full code ?Family Communication: None ?Disposition Plan: Status is: Inpatient.  Remains inpatient, inpatient surgical procedures, hemodialysis. ? ? ?Consultants:  ?Orthopedics ?Nephrology ?Vascular surgery ? ?Procedures:  ?None ? ?Antimicrobials:  ?Vancomycin and cefepime 4/24--- ? ? ?Subjective: ? ?Seen and examined.  No overnight events. ?Showed pictures on her phone.  She is convinced that this wound is not going to heal. ? ? ? ?Objective: ?Vitals:  ? 05/29/21 2052 05/29/21 2346 05/30/21 0503 05/30/21 0959  ?BP: 102/61 (!) 103/45 (!) 113/57 107/79  ?Pulse: 90 86 82 95  ?Resp: '18 18 18 19  '$ ?Temp: 98.1 ?F (36.7 ?C) 98.3 ?F (36.8 ?C) 99.1 ?F (37.3 ?C) 98.5 ?F (36.9 ?C)  ?TempSrc: Oral Oral Oral Oral  ?SpO2: 98% 91% 91% 95%  ?Weight:   113.3 kg   ?Height:      ? ? ?Intake/Output Summary (Last 24 hours) at 05/30/2021 1121 ?Last data filed at 05/30/2021 0900 ?Gross per 24 hour  ?Intake 240 ml  ?Output 2799 ml  ?Net -2559 ml  ? ? ?Filed Weights  ? 05/29/21 0723 05/29/21 1141 05/30/21 0503  ?Weight: 114.7 kg 111.2 kg 113.3 kg  ? ? ?Examination: ? ?General: Chronically sick looking.  Not in any distress.  Looks fairly comfortable and talkative. ?Cardiovascular: S1-S2 normal.  Regular rate rhythm. ?Respiratory: Bilateral clear.  No added sounds. ?Gastrointestinal: Soft.  Nontender.  Obese and pendulous. ?Ext: Chronic ischemic changes.  Multiple digital amputations on the upper extremity. ?Left foot with dressing applied. ?Wound examined by vascular surgery, pictures available in the chart. ?Looks grossly unhealthy margins with  no bleeding tissues. ?Neuro: Intact. ? ? ? ? ?Data Reviewed: I have personally reviewed following labs and imaging studies ? ?CBC: ?Recent Labs  ?Lab 05/26/21 ?1610 05/26/21 ?2148 05/27/21 ?9604 05/28/21 ?5409 05/29/21 ?8119 05/29/21 ?0747  ?WBC 11.5* 12.9* 13.8*  --  15.0* 15.6*  ?HGB  7.0* 8.4* 7.9* 8.2* 7.8* 7.6*  ?HCT 22.6* 26.7* 25.5* 24.0* 24.2* 23.3*  ?MCV 99.6 96.0 95.9  --  94.2 92.8  ?PLT 234 254 254  --  250 237  ? ?Basic Metabolic Panel: ?Recent Labs  ?Lab 05/25/21 ?2105 05/26/21 ?1478 05/26/21 ?1500 05/26/21 ?2148 05/28/21 ?2956 05/29/21 ?0747  ?NA 129* 129* 132* 134* 130* 127*  ?K 4.3 4.0 4.1 4.7 5.2* 5.7*  ?CL 87* 88* 90* 89* 97* 89*  ?CO2 '30 25 24 28  '$ --  19*  ?GLUCOSE 400* 308* 297* 282* 221* 207*  ?BUN 27* 34* 34* 43* 70* 80*  ?CREATININE 4.67* 5.00* 4.99* 5.85* 7.90* 8.51*  ?CALCIUM 8.3* 8.0* 8.1* 8.9  --  8.7*  ?PHOS  --   --  4.0 5.0*  --  5.0*  ? ?GFR: ?Estimated Creatinine Clearance: 9.8 mL/min (A) (by C-G formula based on SCr of 8.51 mg/dL (H)). ?Liver Function Tests: ?Recent Labs  ?Lab 05/25/21 ?2105 05/26/21 ?2130 05/26/21 ?1500 05/26/21 ?2148 05/29/21 ?0747  ?AST 22 15  --   --   --   ?ALT 21 14  --   --   --   ?ALKPHOS 145* 119  --   --   --   ?BILITOT 0.7 0.4  --   --   --   ?PROT 7.7 7.1  --   --   --   ?ALBUMIN 2.5* 2.4* 2.4* 2.9* 2.6*  ? ?No results for input(s): LIPASE, AMYLASE in the last 168 hours. ?No results for input(s): AMMONIA in the last 168 hours. ?Coagulation Profile: ?No results for input(s): INR, PROTIME in the last 168 hours. ?Cardiac Enzymes: ?No results for input(s): CKTOTAL, CKMB, CKMBINDEX, TROPONINI in the last 168 hours. ?BNP (last 3 results) ?No results for input(s): PROBNP in the last 8760 hours. ?HbA1C: ?No results for input(s): HGBA1C in the last 72 hours. ?CBG: ?Recent Labs  ?Lab 05/29/21 ?1218 05/29/21 ?1613 05/29/21 ?2056 05/30/21 ?8657 05/30/21 ?1105  ?GLUCAP 127* 212* 307* 225* 223*  ? ?Lipid Profile: ?Recent Labs  ?  05/28/21 ?0155  ?CHOL 158  ?HDL 24*  ?[place] 75  ?TRIG 296*  ?CHOLHDL 6.6  ? ?Thyroid Function Tests: ?No results for input(s): TSH, T4TOTAL, FREET4, T3FREE, THYROIDAB in the last 72 hours. ?Anemia Panel: ?No results for input(s): VITAMINB12, FOLATE, FERRITIN, TIBC, IRON, RETICCTPCT in the last 72 hours. ?Sepsis  Labs: ?Recent Labs  ?Lab 05/25/21 ?2105 05/26/21 ?0040  ?LATICACIDVEN 2.4* 1.9  ? ? ?Recent Results (from the past 240 hour(s))  ?Blood culture (routine x 2)     Status: Abnormal  ? Collection Time: 05/25/21  9:20 PM  ? Specimen: BLOOD LEFT HAND  ?Result Value Ref Range Status  ? Specimen Description BLOOD LEFT HAND  Final  ? Special Requests   Final  ?  BOTTLES DRAWN AEROBIC AND ANAEROBIC Blood Culture adequate volume  ? Culture  Setup Time   Final  ?  GRAM NEGATIVE RODS ?AEROBIC BOTTLE ONLY ?CRITICAL RESULT CALLED TO, READ BACK BY AND VERIFIED WITH: PHARMD JESSICA MILLEN ON 05/28/21 @ 1900 BY DRT ?Performed at [facility], [place] [address] [place], [place] [address] ?  ? Culture ENTEROBACTER AEROGENES (A)  Final  ?  Report Status 05/30/2021 FINAL  Final  ? Organism ID, Bacteria ENTEROBACTER AEROGENES  Final  ?    Susceptibility  ? Enterobacter aerogenes - MIC*  ?  CEFAZOLIN >=64 RESISTANT Resistant   ?  CEFEPIME <=0.12 SENSITIVE Sensitive   ?  CEFTAZIDIME <=1 SENSITIVE Sensitive   ?  CEFTRIAXONE <=0.25 SENSITIVE Sensitive   ?  CIPROFLOXACIN <=0.25 SENSITIVE Sensitive   ?  GENTAMICIN <=1 SENSITIVE Sensitive   ?  IMIPENEM 1 SENSITIVE Sensitive   ?  TRIMETH/SULFA <=20 SENSITIVE Sensitive   ?  PIP/TAZO <=4 SENSITIVE Sensitive   ?  * ENTEROBACTER AEROGENES  ?Blood Culture ID Panel (Reflexed)     Status: Abnormal  ? Collection Time: 05/25/21  9:20 PM  ?Result Value Ref Range Status  ? Enterococcus faecalis NOT DETECTED NOT DETECTED Final  ? Enterococcus Faecium NOT DETECTED NOT DETECTED Final  ? Listeria monocytogenes NOT DETECTED NOT DETECTED Final  ? Staphylococcus species NOT DETECTED NOT DETECTED Final  ? Staphylococcus aureus (BCID) NOT DETECTED NOT DETECTED Final  ? Staphylococcus epidermidis NOT DETECTED NOT DETECTED Final  ? Staphylococcus lugdunensis NOT DETECTED NOT DETECTED Final  ? Streptococcus species NOT DETECTED NOT DETECTED Final  ? Streptococcus agalactiae NOT DETECTED NOT DETECTED Final  ?  Streptococcus pneumoniae NOT DETECTED NOT DETECTED Final  ? Streptococcus pyogenes NOT DETECTED NOT DETECTED Final  ? A.calcoaceticus-baumannii NOT DETECTED NOT DETECTED Final  ? Bacteroides fragilis NOT DETECTED NOT DE

## 2021-05-30 NOTE — Progress Notes (Addendum)
[facility] [place] ? ?Subjective  - No new complaints ? ? ?Objective ?(!) 113/57 ?82 ?99.1 ?F (37.3 ?C) (Oral) ?18 ?91% ? ?Intake/Output Summary (Last 24 hours) at 05/30/2021 7412 ?Last data filed at 05/29/2021 1141 ?Gross per 24 hour  ?Intake --  ?Output 2799 ml  ?Net -2799 ml  ? ? ?Left foot cool totuch ? ?No active motor in toes, Doppler peroneal signal ?Tissue is pale no active bleeding ?Dry dressing applied ? ? ? ? ?Assessment/Planning: ?Left foot with non healing wound ischemic 4th and 5th toe amputation ?We will observe the wound healing over the weekend for ischemia ?She has a peroneal signal, no active motor and is at high risk of amputation.    ? ?On IV antibiotics for positive blood cultures ?Tm 99 ?Positive leukocytosis ?HGB 7.6 asymptomatic  acute on chronic anemia.  Will defer transfusion to Nephrology. ? ?Roxy Horseman ?05/30/2021 ?8:21 AM ?-- ? ?VASCULAR STAFF ADDENDUM: ?I have independently interviewed and examined the patient. ?I agree with the above.  ?Pt aware she will likely require higher level of amputation ? ?J. Melene Muller, MD ?[facility] ?Office Phone Number: [phone] ?05/30/2021 8:46 AM ? ? ? ?Laboratory ?Lab Results: ?Recent Labs  ?  05/29/21 ?4709 05/29/21 ?6283  ?WBC 15.0* 15.6*  ?HGB 7.8* 7.6*  ?HCT 24.2* 23.3*  ?PLT 250 237  ? ?BMET ?Recent Labs  ?  05/28/21 ?0851 05/29/21 ?6629  ?NA 130* 127*  ?K 5.2* 5.7*  ?CL 97* 89*  ?CO2  --  19*  ?GLUCOSE 221* 207*  ?BUN 70* 80*  ?CREATININE 7.90* 8.51*  ?CALCIUM  --  8.7*  ? ? ?COAG ?Lab Results  ?Component Value Date  ? INR 1.0 11/14/2018  ? INR 0.90 01/30/2011  ? ?No results found for: PTT ? ? ? ?

## 2021-05-30 NOTE — Progress Notes (Addendum)
?[facility] ?Progress Note  ? ?Subjective: Seen in room sitting up at bedside.  Patient says if they have to do a BKA, she is fine with them proceeding with amputation. VVS planning to monitor over W/E. No C/Os.  ? ?Objective ?Vitals:  ? 05/29/21 1214 05/29/21 2052 05/29/21 2346 05/30/21 0503  ?BP: (!) 101/50 102/61 (!) 103/45 (!) 113/57  ?Pulse: 84 90 86 82  ?Resp: '20 18 18 18  '$ ?Temp:  98.1 ?F (36.7 ?C) 98.3 ?F (36.8 ?C) 99.1 ?F (37.3 ?C)  ?TempSrc:  Oral Oral Oral  ?SpO2: 97% 98% 91% 91%  ?Weight:    113.3 kg  ?Height:      ? ?Physical Exam ?General: Chronically ill appearing obese female in NAD ?Heart: S1,S2 RRR No M/R/G  ?Lungs: CTAB Anteriorly. No WOB ?Abdomen: Obese, NABS ?Extremities: Trace LLE  pre-tib edema. No RLE edema. Woody appearance LE. ACE wrap L foot.  ?Dialysis Access: R AVF +T/B ?  ? ?Additional Objective ?Labs: ?Basic Metabolic Panel: ?Recent Labs  ?Lab 05/26/21 ?1500 05/26/21 ?2148 05/28/21 ?2330 05/29/21 ?0747  ?NA 132* 134* 130* 127*  ?K 4.1 4.7 5.2* 5.7*  ?CL 90* 89* 97* 89*  ?CO2 24 28  --  19*  ?GLUCOSE 297* 282* 221* 207*  ?BUN 34* 43* 70* 80*  ?CREATININE 4.99* 5.85* 7.90* 8.51*  ?CALCIUM 8.1* 8.9  --  8.7*  ?PHOS 4.0 5.0*  --  5.0*  ? ?Liver Function Tests: ?Recent Labs  ?Lab 05/25/21 ?2105 05/26/21 ?0762 05/26/21 ?1500 05/26/21 ?2148 05/29/21 ?0747  ?AST 22 15  --   --   --   ?ALT 21 14  --   --   --   ?ALKPHOS 145* 119  --   --   --   ?BILITOT 0.7 0.4  --   --   --   ?PROT 7.7 7.1  --   --   --   ?ALBUMIN 2.5* 2.4* 2.4* 2.9* 2.6*  ? ?No results for input(s): LIPASE, AMYLASE in the last 168 hours. ?CBC: ?Recent Labs  ?Lab 05/26/21 ?2633 05/26/21 ?2148 05/27/21 ?3545 05/28/21 ?6256 05/29/21 ?3893 05/29/21 ?0747  ?WBC 11.5* 12.9* 13.8*  --  15.0* 15.6*  ?HGB 7.0* 8.4* 7.9* 8.2* 7.8* 7.6*  ?HCT 22.6* 26.7* 25.5* 24.0* 24.2* 23.3*  ?MCV 99.6 96.0 95.9  --  94.2 92.8  ?PLT 234 254 254  --  250 237  ? ?Blood Culture ?   ?Component Value Date/Time  ? [place] BLOOD 05/25/2021 2142   ? SPECREQUEST  05/25/2021 2142  ?  L EJ BOTTLES DRAWN AEROBIC AND ANAEROBIC Blood Culture adequate volume  ? CULT (A) 05/25/2021 2142  ?  STAPHYLOCOCCUS EPIDERMIDIS ?THE SIGNIFICANCE OF ISOLATING THIS ORGANISM FROM A SINGLE SET OF BLOOD CULTURES WHEN MULTIPLE SETS ARE DRAWN IS UNCERTAIN. PLEASE NOTIFY THE MICROBIOLOGY DEPARTMENT WITHIN ONE WEEK IF SPECIATION AND SENSITIVITIES ARE REQUIRED. ?Performed at [facility], [place] [address] [place], [place] [address] ?  ? REPTSTATUS 05/28/2021 FINAL 05/25/2021 2142  ? ? ?Cardiac Enzymes: ?No results for input(s): CKTOTAL, CKMB, CKMBINDEX, TROPONINI in the last 168 hours. ?CBG: ?Recent Labs  ?Lab 05/28/21 ?2103 05/29/21 ?1218 05/29/21 ?1613 05/29/21 ?2056 05/30/21 ?7681  ?GLUCAP 243* 127* 212* 307* 225*  ? ?Iron Studies: No results for input(s): IRON, TIBC, TRANSFERRIN, FERRITIN in the last 72 hours. ?'@lablastinr3'$ @ ?Studies/Results: ?[place] EKG SITE RITE ? ?Result Date: 05/29/2021 ?If [employer] not attached, placement could not be confirmed due to current cardiac rhythm.  ?Medications: ?  sodium chloride    ? ceFEPime (MAXIPIME) IV 1 g (05/27/21 2359)  ? vancomycin Stopped (05/29/21 1254)  ? ? Chlorhexidine Gluconate Cloth  6 each Topical Q0600  ? Chlorhexidine Gluconate Cloth  6 each Topical Q0600  ? clopidogrel  75 mg Oral Q breakfast  ? insulin aspart  0-5 Units Subcutaneous QHS  ? insulin aspart  0-6 Units Subcutaneous TID WC  ? insulin aspart protamine- aspart  35 Units Subcutaneous BID WC  ? metoprolol succinate  50 mg Oral QHS  ? rosuvastatin  40 mg Oral Daily  ? sodium chloride flush  3 mL Intravenous Q12H  ? sodium chloride flush  3 mL Intravenous Q12H  ? ? ? ?Dialysis Orders: ?MWF  - GOC ?4hrs, BFR 450, DFR 800,  EDW 110.6kg, 2K/ 2.5Ca, RU AVF,  ?-Heparin 10000 unit IV bolus TIW ? - No ESA 2/2 ?malignancy (no diagnosis - has stable renal lesion, pulm lesions -> saw onc 4/17, plan is for bronchoscopy with Bx of lung nodules in near future) ?- Calcitriol 2.5  mcg PO qHD ?- Parsabiv 7.5 mg IV qHD  ?  ?Assessment/Plan: ? 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  S/p balloon angioplasty and lasert atherectomy to LATA 4/26, then amputation of L 4th/5th metarsals 05/28/2021 Dr. Donzetta Matters. At high risk for L BKA per VVS note.  1 of 2 Blood Cx + for MRSA. On Vanc/Cefepime. ?Volume overload: S/p extra HD 4/25, now back on usual schedule.Continue lowering volume as tolerated with HD.  ?ESRD: Continue HD per usual MWF schedule - HD today on schedule. Next HD 06/01/2021. ?Mild hyperkalemia: K+ 5.7 today. Appropriate for 2.0 K bath. Follow labs.  ?Hypertension: BP variable, on metoprolol succinate 100 mg PO QHS. Decrease dose to 50 mg PO q HS. Can utilize midodrine and albumin with HD for BP support.  ?Anemia of ESRD: Hgb 7 on admit, s/p 1U PRBCs 4/25. Hgb 8.2 today. Not getting outpatient ESA d/t potential malignancy.   ?Secondary Hyperparathyroidism: Ca/Phos ok. Continue VDRA on MWF.  Unable to give parsabiv not on formulary.  Continue binders. ? Nutrition - Renal diet w/fluid restrictions.  ?DMT2 - per PMD ?RA -humira on hold ?Chronic pain - per PMD ? ?Rita H. Brown NP-C ?05/30/2021, 9:01 AM  ?[facility] ?[phone] ? ?Nephrology attending ?I have personally seen and examined the patient.  Chart reviewed.  I agree with above. ?Vascular surgeon is planning for observation over the weekend and possible amputation early next week.  She is tolerating dialysis well, last HD yesterday with 2.8 L UF.  Plan for regular HD on Monday.  Continue current management.  Hyponatremia and hyperkalemia management dialysis. ? ?D.  Carolin Sicks, CKA ? ? ?  ? ?

## 2021-05-31 DIAGNOSIS — Z992 Dependence on renal dialysis: Secondary | ICD-10-CM | POA: Diagnosis not present

## 2021-05-31 DIAGNOSIS — D631 Anemia in chronic kidney disease: Secondary | ICD-10-CM | POA: Diagnosis not present

## 2021-05-31 DIAGNOSIS — N186 End stage renal disease: Secondary | ICD-10-CM | POA: Diagnosis not present

## 2021-05-31 DIAGNOSIS — M86272 Subacute osteomyelitis, left ankle and foot: Secondary | ICD-10-CM | POA: Diagnosis not present

## 2021-05-31 LAB — GLUCOSE, CAPILLARY
Glucose-Capillary: 234 mg/dL — ABNORMAL HIGH (ref 70–99)
Glucose-Capillary: 228 mg/dL — ABNORMAL HIGH (ref 70–99)
Glucose-Capillary: 308 mg/dL — ABNORMAL HIGH (ref 70–99)
Glucose-Capillary: 344 mg/dL — ABNORMAL HIGH (ref 70–99)

## 2021-05-31 LAB — RENAL FUNCTION PANEL
Albumin: 2.5 g/dL — ABNORMAL LOW (ref 3.5–5.0)
Anion gap: 16 — ABNORMAL HIGH (ref 5–15)
BUN: 64 mg/dL — ABNORMAL HIGH (ref 6–20)
CO2: 22 mmol/L (ref 22–32)
Calcium: 8.6 mg/dL — ABNORMAL LOW (ref 8.9–10.3)
Chloride: 94 mmol/L — ABNORMAL LOW (ref 98–111)
Creatinine, Ser: 7.8 mg/dL — ABNORMAL HIGH (ref 0.44–1.00)
GFR, Estimated: 6 mL/min — ABNORMAL LOW (ref >60)
Glucose, Bld: 212 mg/dL — ABNORMAL HIGH (ref 70–99)
Phosphorus: 4.9 mg/dL — ABNORMAL HIGH (ref 2.5–4.6)
Potassium: 5.2 mmol/L — ABNORMAL HIGH (ref 3.5–5.1)
Sodium: 132 mmol/L — ABNORMAL LOW (ref 135–145)

## 2021-05-31 MED ORDER — SODIUM ZIRCONIUM CYCLOSILICATE 10 G PO PACK
10.0000 g | PACK | Freq: Once | ORAL | Status: AC
  Administered 2021-05-31: 10 g via ORAL
  Filled 2021-05-31: qty 1

## 2021-05-31 NOTE — Progress Notes (Addendum)
[facility] [place] ? ?Subjective  - No new complaints  ? ? ?Objective ?(!) 109/50 ?84 ?98.4 ?F (36.9 ?C) (Oral) ?18 ?99% ? ?Intake/Output Summary (Last 24 hours) at 05/31/2021 0816 ?Last data filed at 05/30/2021 2152 ?Gross per 24 hour  ?Intake 243 ml  ?Output --  ?Net 243 ml  ? ?Left dressing clean and dry  ?No toe motor and foot is cool to touch ?Lungs non labored breathing ?General no acute distress ? ? ? ? ?Assessment/Planning: ? ?Left foot with non healing wound ischemic 4th and 5th toe amputation ?We will observe the wound healing over the weekend for ischemia ?She has a peroneal signal, no active motor and is at high risk of amputation.    ? ?No new labs today ?HD followed by Nephrology ?Afebrile this am ?Pending wound appearence and exam by Dr. Donzetta Matters in the am she is still at high risk of left LE BKA ? ?Roxy Horseman ?05/31/2021 ?8:16 AM ?-- ? ?VASCULAR STAFF ADDENDUM: ?I have independently interviewed and examined the patient. ?I agree with the above.  ?Pt aware she will likely require higher level of amputation. ? ?J. Melene Muller, MD ?[facility] ?Office Phone Number: [phone] ?05/31/2021 8:24 AM ?  ? ?Laboratory ?Lab Results: ?Recent Labs  ?  05/29/21 ?0177 05/29/21 ?9390  ?WBC 15.0* 15.6*  ?HGB 7.8* 7.6*  ?HCT 24.2* 23.3*  ?PLT 250 237  ? ?BMET ?Recent Labs  ?  05/28/21 ?0851 05/29/21 ?3009  ?NA 130* 127*  ?K 5.2* 5.7*  ?CL 97* 89*  ?CO2  --  19*  ?GLUCOSE 221* 207*  ?BUN 70* 80*  ?CREATININE 7.90* 8.51*  ?CALCIUM  --  8.7*  ? ? ?COAG ?Lab Results  ?Component Value Date  ? INR 1.0 11/14/2018  ? INR 0.90 01/30/2011  ? ?No results found for: PTT ? ? ? ?

## 2021-05-31 NOTE — Progress Notes (Addendum)
?[facility] ?Progress Note  ? ?Subjective: Seen in room.  HD tomorrow on schedule.     ? ?Objective ?Vitals:  ? 05/30/21 2008 05/31/21 0021 05/31/21 0406 05/31/21 0751  ?BP: (!) 126/40 (!) 102/51 (!) 113/51 (!) 109/50  ?Pulse: 95 87 88 84  ?Resp:  '16 20 18  '$ ?Temp: 99 ?F (37.2 ?C) 98.7 ?F (37.1 ?C) 98.4 ?F (36.9 ?C) 98.4 ?F (36.9 ?C)  ?TempSrc: Oral Oral Oral Oral  ?SpO2: 97% 98% 98% 99%  ?Weight:      ?Height:      ? ?Physical Exam ?General: Chronically ill appearing obese female in NAD ?Heart: S1,S2 RRR No M/R/G  ?Lungs: CTAB Anteriorly. No WOB ?Abdomen: Obese, NABS ?Extremities: Trace LLE  pre-tib edema. No RLE edema. Woody appearance LE. ACE wrap L foot.  ?Dialysis Access: R AVF +T/B ? ? ? ?Additional Objective ?Labs: ?Basic Metabolic Panel: ?Recent Labs  ?Lab 05/26/21 ?1500 05/26/21 ?2148 05/28/21 ?1324 05/29/21 ?0747  ?NA 132* 134* 130* 127*  ?K 4.1 4.7 5.2* 5.7*  ?CL 90* 89* 97* 89*  ?CO2 24 28  --  19*  ?GLUCOSE 297* 282* 221* 207*  ?BUN 34* 43* 70* 80*  ?CREATININE 4.99* 5.85* 7.90* 8.51*  ?CALCIUM 8.1* 8.9  --  8.7*  ?PHOS 4.0 5.0*  --  5.0*  ? ?Liver Function Tests: ?Recent Labs  ?Lab 05/25/21 ?2105 05/26/21 ?4010 05/26/21 ?1500 05/26/21 ?2148 05/29/21 ?0747  ?AST 22 15  --   --   --   ?ALT 21 14  --   --   --   ?ALKPHOS 145* 119  --   --   --   ?BILITOT 0.7 0.4  --   --   --   ?PROT 7.7 7.1  --   --   --   ?ALBUMIN 2.5* 2.4* 2.4* 2.9* 2.6*  ? ?No results for input(s): LIPASE, AMYLASE in the last 168 hours. ?CBC: ?Recent Labs  ?Lab 05/26/21 ?2725 05/26/21 ?2148 05/27/21 ?3664 05/28/21 ?4034 05/29/21 ?7425 05/29/21 ?0747  ?WBC 11.5* 12.9* 13.8*  --  15.0* 15.6*  ?HGB 7.0* 8.4* 7.9* 8.2* 7.8* 7.6*  ?HCT 22.6* 26.7* 25.5* 24.0* 24.2* 23.3*  ?MCV 99.6 96.0 95.9  --  94.2 92.8  ?PLT 234 254 254  --  250 237  ? ?Blood Culture ?   ?Component Value Date/Time  ? [place] BLOOD LEFT HAND 05/30/2021 0136  ? SPECREQUEST  05/30/2021 0136  ?  BOTTLES DRAWN AEROBIC AND ANAEROBIC Blood Culture adequate volume   ? CULT  05/30/2021 0136  ?  NO GROWTH 1 DAY ?Performed at [facility], [place] [address], [place], [place] [address] ?  ? REPTSTATUS PENDING 05/30/2021 0136  ? ? ?Cardiac Enzymes: ?No results for input(s): CKTOTAL, CKMB, CKMBINDEX, TROPONINI in the last 168 hours. ?CBG: ?Recent Labs  ?Lab 05/30/21 ?0609 05/30/21 ?1105 05/30/21 ?1748 05/30/21 ?2036 05/31/21 ?0622  ?GLUCAP 225* 223* 221* 201* 234*  ? ?Iron Studies: No results for input(s): IRON, TIBC, TRANSFERRIN, FERRITIN in the last 72 hours. ?'@lablastinr3'$ @ ?Studies/Results: ?[place] EKG SITE RITE ? ?Result Date: 05/29/2021 ?If [employer] not attached, placement could not be confirmed due to current cardiac rhythm.  ?Medications: ? sodium chloride    ? ceFEPime (MAXIPIME) IV 1 g (05/27/21 2359)  ? ? Chlorhexidine Gluconate Cloth  6 each Topical Q0600  ? Chlorhexidine Gluconate Cloth  6 each Topical Q0600  ? clopidogrel  75 mg Oral Q breakfast  ? insulin aspart  0-5 Units Subcutaneous  QHS  ? insulin aspart  0-6 Units Subcutaneous TID WC  ? insulin aspart protamine- aspart  35 Units Subcutaneous BID WC  ? metoprolol succinate  50 mg Oral QHS  ? rosuvastatin  40 mg Oral Daily  ? sodium chloride flush  3 mL Intravenous Q12H  ? sodium chloride flush  3 mL Intravenous Q12H  ? ? ? ?Dialysis Orders: ?MWF  - GOC ?4hrs, BFR 450, DFR 800,  EDW 110.6kg, 2K/ 2.5Ca, RU AVF,  ?-Heparin 10000 unit IV bolus TIW ? - No ESA 2/2 ?malignancy (no diagnosis - has stable renal lesion, pulm lesions -> saw onc 4/17, plan is for bronchoscopy with Bx of lung nodules in near future) ?- Calcitriol 2.5 mcg PO qHD ?- Parsabiv 7.5 mg IV qHD  ?  ?Assessment/Plan: ? 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  S/p balloon angioplasty and lasert atherectomy to LATA 4/26, then amputation of L 4th/5th metarsals 05/28/2021 Dr. Donzetta Matters. At high risk for L BKA per VVS note.  1 of 2 Blood Cx + for MRSA. On Vanc/Cefepime. ?Volume overload:  S/p extra HD 4/25, now back on usual schedule.Na falling corrected Na 129. Continue lowering volume as tolerated with HD.  ?ESRD: Continue HD per usual MWF schedule - HD today on schedule. Next HD 06/01/2021. ?Mild hyperkalemia: K+ 5.7 05/30/2021. Renal Function panel this AM and treat with lokelma if needed.  ?Hypertension: BP variable, on metoprolol succinate 100 mg PO QHS. Decrease dose to 50 mg PO q HS. Can utilize midodrine and albumin with HD for BP support.  ?Anemia of ESRD: Hgb 7 on admit, s/p 1U PRBCs 4/25. Hgb 8.2 today. Not getting outpatient ESA d/t potential malignancy.   ?Secondary Hyperparathyroidism: Ca/Phos ok. Continue VDRA on MWF.  Unable to give parsabiv not on formulary.  Continue binders. ? Nutrition - Renal diet w/fluid restrictions.  ?DMT2 - per PMD ?RA -humira on hold ?Chronic pain - per PMD ? ?Rita H. Brown NP-C ?05/31/2021, 9:18 AM  ?[facility] ?[phone] ? ?Nephrology attending: ?I have personally seen and examined the patient.  Chart reviewed.  I agree with above. ?The patient is status post left fourth and fifth metatarsal amputation on 4/27, now vascular is following for possible higher level of amputation.  Currently on antibiotics.  Clinically stable therefore plan for regular dialysis tomorrow.  Continue current management. ? ?D.  Evert Wenrich ?CKA ? ? ? ? ?  ? ?

## 2021-05-31 NOTE — Progress Notes (Signed)
?PROGRESS NOTE ? ? ? ?[name]  ENI:[id number] DOB: Jun 28, 1965 DOA: 05/25/2021 ?PCP: Sandi Mariscal, MD  ? ? 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  She had a chronic wound on her left fifth toe and last 3 days it has been bleeding and draining.  In the emergency room temperature 101.8, tachycardia 140, blood pressure stable.  On 4 L oxygen.  Hemoglobin 7.2.  Lactic acid 2.4.  Left foot x-ray with soft tissue swelling and osteomyelitis fifth phalanx.  Started on antibiotics, orthopedics , vascular and nephrology consulted. ?Fourth and fifth metatarsal amputation 4/27. ? ?Assessment & Plan: ?  ?Sepsis present on admission secondary to left foot osteomyelitis, diabetic foot ulcer with osteomyelitis: ? ?Blood cultures with Staph epidermidis 1 out of 4 bottles that is MRSA this is likely contaminant. ?Enterobacter aerogenes , on cefepime. ?Repeat blood cultures negative so far. Discontinue vancomycin, continue cefepime. ?4/26, angiography, posterior tibial runoff.  Balloon dilatation. ?4/27, fourth and fifth toe ray amputation, trying to preserve the leg, however high risk of transtibial amputation. ?Patient agreeable for transtibial amputation if no chance of healing or complicated postop course. ? ?Essential hypertension: Blood pressure stable.  Home metoprolol. ? ?Hyperlipidemia: On statin. ? ?Rheumatoid arthritis: Takes Humira at home.  On hold due to acute infection. ? ?Anemia of chronic disease: Received 1 unit of PRBC with dialysis 4/25.  Hemoglobin is stable since then. ? ?ESRD on hemodialysis:  ?Receiving multiple hemodialysis to try to optimize volume status.  Next HD tomorrow. ? ?Type 2 diabetes, poorly controlled with  hyperglycemia: ?On home dose of insulin.  Stable today. ? ?Morbid obesity: BMI more than 40.  Patient probably has underlying sleep apnea, she will need outpatient investigations after discharge.  She had attempted sleep study in the past but was unsuccessful.  She could not fall asleep in the lab. ? ? ? ?DVT prophylaxis:   SCDs ? ? ?Code Status: Full code ?Family Communication: None ?Disposition Plan: Status is: Inpatient.  Remains inpatient, inpatient surgical procedures, hemodialysis. ? ? ?Consultants:  ?Orthopedics ?Nephrology ?Vascular surgery ? ?Procedures:  ?None ? ?Antimicrobials:  ?Vancomycin and cefepime 4/24--- 4/30 ?Cefepime 4/24-- ? ? ?Subjective: ? ?Seen and examined.  No overnight events.  She looked at the picture of the wound and she feels it is healthy. ? ? ? ?Objective: ?Vitals:  ? 05/31/21 0021 05/31/21 0406 05/31/21 0751 05/31/21 1116  ?BP: (!) 102/51 (!) 113/51 (!) 109/50 106/61  ?Pulse: 87 88 84 89  ?Resp: '16 20 18 20  '$ ?Temp: 98.7 ?F (37.1 ?C) 98.4 ?F (36.9 ?C) 98.4 ?F (36.9 ?C) 98.1 ?F (36.7 ?C)  ?TempSrc: Oral Oral Oral Oral  ?SpO2: 98% 98% 99% 100%  ?Weight:      ?Height:      ? ? ?Intake/Output Summary (Last 24 hours) at 05/31/2021 1325 ?Last data filed at 05/30/2021 2152 ?Gross per 24 hour  ?Intake 3 ml  ?Output --  ?Net 3 ml  ? ? ?Filed Weights  ? 05/29/21 0723 05/29/21 1141 05/30/21 0503  ?Weight: 114.7 kg 111.2 kg 113.3 kg  ? ? ?Examination: ? ?General: Comfortably sitting and eating breakfast. ?Cardiovascular: S1-S2 normal.  Regular rate rhythm ?Respiratory: Bilateral clear on room air. ?Gastrointestinal: Soft.  Pendulous. ?Ext: Chronic ischemic changes.  Left foot with dressing on. ?Neuro: Intact. ?Musculoskeletal:  No deformities. ? ? ? ? ? ? ?Data Reviewed: I have personally reviewed following labs and imaging studies ? ?CBC: ?Recent Labs  ?Lab 05/26/21 ?7564 05/26/21 ?2148 05/27/21 ?3329 05/28/21 ?5188 05/29/21 ?4166 05/29/21 ?0747  ?WBC 11.5* 12.9* 13.8*  --  15.0* 15.6*  ?HGB 7.0*  8.4* 7.9* 8.2* 7.8* 7.6*  ?HCT 22.6* 26.7* 25.5* 24.0* 24.2* 23.3*  ?MCV 99.6 96.0 95.9  --  94.2 92.8  ?PLT 234 254 254  --  250 237  ? ?Basic Metabolic Panel: ?Recent Labs  ?Lab 05/26/21 ?0630 05/26/21 ?1500 05/26/21 ?2148 05/28/21 ?1601 05/29/21 ?0747 05/31/21 ?0932  ?NA 129* 132* 134* 130* 127* 132*  ?K 4.0 4.1 4.7 5.2* 5.7* 5.2*  ?CL 88* 90* 89* 97* 89* 94*  ?CO2 '25 24 28  '$ --  19* 22  ?GLUCOSE 308* 297* 282* 221* 207* 212*  ?BUN 34* 34* 43* 70* 80* 64*  ?CREATININE 5.00* 4.99* 5.85* 7.90* 8.51* 7.80*  ?CALCIUM 8.0* 8.1* 8.9  --  8.7* 8.6*  ?PHOS  --  4.0 5.0*  --  5.0* 4.9*  ? ?GFR: ?Estimated Creatinine Clearance: 10.6 mL/min (A) (by C-G formula based on SCr of 7.8 mg/dL (H)). ?Liver Function Tests: ?Recent Labs  ?Lab 05/25/21 ?2105 05/26/21 ?3557 05/26/21 ?1500 05/26/21 ?2148 05/29/21 ?0747 05/31/21 ?0929  ?AST 22 15  --   --   --   --   ?ALT 21 14  --   --   --   --   ?ALKPHOS 145* 119  --   --   --   --   ?BILITOT 0.7 0.4  --   --   --   --   ?PROT 7.7 7.1  --   --   --   --   ?ALBUMIN 2.5* 2.4* 2.4* 2.9* 2.6* 2.5*  ? ?No results for input(s): LIPASE, AMYLASE in the last 168 hours. ?No results for input(s): AMMONIA in the last 168 hours. ?Coagulation Profile: ?No results for input(s): INR, PROTIME in the last 168 hours. ?Cardiac Enzymes: ?No results for input(s): CKTOTAL, CKMB, CKMBINDEX, TROPONINI in the last 168 hours. ?BNP (last 3 results) ?No results for input(s): PROBNP in the last 8760 hours. ?HbA1C: ?No results for input(s): HGBA1C in the last 72 hours. ?CBG: ?Recent Labs  ?Lab 05/30/21 ?1105 05/30/21 ?1748 05/30/21 ?2036 05/31/21 ?0622 05/31/21 ?1115  ?GLUCAP 223* 221* 201* 234* 228*  ? ?Lipid Profile: ?No results for input(s): CHOL, HDL, LDLCALC, TRIG, CHOLHDL, LDLDIRECT in the last 72 hours. ? ?Thyroid Function Tests: ?No results for input(s): TSH, T4TOTAL, FREET4, T3FREE, THYROIDAB in the last 72 hours. ?Anemia Panel: ?No results for input(s): VITAMINB12, FOLATE, FERRITIN, TIBC, IRON, RETICCTPCT  in the last 72 hours. ?Sepsis Labs: ?Recent Labs  ?Lab 05/25/21 ?2105 05/26/21 ?0040  ?LATICACIDVEN 2.4* 1.9  ? ? ?Recent Results (from the past 240 hour(s))  ?Blood culture (routine x 2)     Status: Abnormal  ? Collection Time: 05/25/21  9:20 PM  ? Specimen: BLOOD LEFT HAND  ?Result Value Ref Range Status  ? Specimen Description BLOOD LEFT HAND  Final  ? Special Requests   Final  ?  BOTTLES DRAWN AEROBIC AND ANAEROBIC Blood Culture adequate volume  ? Culture  Setup Time   Final  ?  GRAM NEGATIVE RODS ?AEROBIC BOTTLE ONLY ?CRITICAL RESULT CALLED TO, READ BACK BY AND VERIFIED WITH: PHARMD JESSICA MILLEN ON 05/28/21 @ 1900 BY DRT ?Performed at [facility], [place] [address], [place], [place] [address] ?  ?  Culture ENTEROBACTER AEROGENES (A)  Final  ? Report Status 05/30/2021 FINAL  Final  ? Organism ID, Bacteria ENTEROBACTER AEROGENES  Final  ?    Susceptibility  ? Enterobacter aerogenes - MIC*  ?  CEFAZOLIN >=64 RESISTANT Resistant   ?  CEFEPIME <=0.12 SENSITIVE Sensitive   ?  CEFTAZIDIME <=1 SENSITIVE Sensitive   ?  CEFTRIAXONE <=0.25 SENSITIVE Sensitive   ?  CIPROFLOXACIN <=0.25 SENSITIVE Sensitive   ?  GENTAMICIN <=1 SENSITIVE Sensitive   ?  IMIPENEM 1 SENSITIVE Sensitive   ?  TRIMETH/SULFA <=20 SENSITIVE Sensitive   ?  PIP/TAZO <=4 SENSITIVE Sensitive   ?  * ENTEROBACTER AEROGENES  ?Blood Culture ID Panel (Reflexed)     Status: Abnormal  ? Collection Time: 05/25/21  9:20 PM  ?Result Value Ref Range Status  ? Enterococcus faecalis NOT DETECTED NOT DETECTED Final  ? Enterococcus Faecium NOT DETECTED NOT DETECTED Final  ? Listeria monocytogenes NOT DETECTED NOT DETECTED Final  ? Staphylococcus species NOT DETECTED NOT DETECTED Final  ? Staphylococcus aureus (BCID) NOT DETECTED NOT DETECTED Final  ? Staphylococcus epidermidis NOT DETECTED NOT DETECTED Final  ? Staphylococcus lugdunensis NOT DETECTED NOT DETECTED Final  ? Streptococcus species NOT DETECTED NOT DETECTED Final  ? Streptococcus agalactiae NOT DETECTED  NOT DETECTED Final  ? Streptococcus pneumoniae NOT DETECTED NOT DETECTED Final  ? Streptococcus pyogenes NOT DETECTED NOT DETECTED Final  ? A.calcoaceticus-baumannii NOT DETECTED NOT DETECTED Final  ? Bacteroides fr

## 2021-06-01 DIAGNOSIS — A419 Sepsis, unspecified organism: Secondary | ICD-10-CM

## 2021-06-01 DIAGNOSIS — R6520 Severe sepsis without septic shock: Secondary | ICD-10-CM

## 2021-06-01 DIAGNOSIS — D631 Anemia in chronic kidney disease: Secondary | ICD-10-CM | POA: Diagnosis not present

## 2021-06-01 DIAGNOSIS — N186 End stage renal disease: Secondary | ICD-10-CM | POA: Diagnosis not present

## 2021-06-01 DIAGNOSIS — M86272 Subacute osteomyelitis, left ankle and foot: Secondary | ICD-10-CM | POA: Diagnosis not present

## 2021-06-01 LAB — CBC
HCT: 20.1 % — ABNORMAL LOW (ref 36.0–46.0)
Hemoglobin: 6.4 g/dL — CL (ref 12.0–15.0)
MCH: 31.1 pg (ref 26.0–34.0)
MCHC: 31.8 g/dL (ref 30.0–36.0)
MCV: 97.6 fL (ref 80.0–100.0)
Platelets: 239 10*3/uL (ref 150–400)
RBC: 2.06 MIL/uL — ABNORMAL LOW (ref 3.87–5.11)
RDW: 15.1 % (ref 11.5–15.5)
WBC: 17.8 10*3/uL — ABNORMAL HIGH (ref 4.0–10.5)
nRBC: 0 % (ref 0.0–0.2)

## 2021-06-01 LAB — GLUCOSE, CAPILLARY
Glucose-Capillary: 218 mg/dL — ABNORMAL HIGH (ref 70–99)
Glucose-Capillary: 186 mg/dL — ABNORMAL HIGH (ref 70–99)
Glucose-Capillary: 221 mg/dL — ABNORMAL HIGH (ref 70–99)
Glucose-Capillary: 297 mg/dL — ABNORMAL HIGH (ref 70–99)

## 2021-06-01 LAB — RENAL FUNCTION PANEL
Albumin: 2.5 g/dL — ABNORMAL LOW (ref 3.5–5.0)
Anion gap: 17 — ABNORMAL HIGH (ref 5–15)
BUN: 75 mg/dL — ABNORMAL HIGH (ref 6–20)
CO2: 22 mmol/L (ref 22–32)
Calcium: 8.6 mg/dL — ABNORMAL LOW (ref 8.9–10.3)
Chloride: 92 mmol/L — ABNORMAL LOW (ref 98–111)
Creatinine, Ser: 9.08 mg/dL — ABNORMAL HIGH (ref 0.44–1.00)
GFR, Estimated: 5 mL/min — ABNORMAL LOW (ref >60)
Glucose, Bld: 243 mg/dL — ABNORMAL HIGH (ref 70–99)
Phosphorus: 6.9 mg/dL — ABNORMAL HIGH (ref 2.5–4.6)
Potassium: 5.3 mmol/L — ABNORMAL HIGH (ref 3.5–5.1)
Sodium: 131 mmol/L — ABNORMAL LOW (ref 135–145)

## 2021-06-01 LAB — PREPARE RBC (CROSSMATCH)

## 2021-06-01 MED ORDER — SODIUM CHLORIDE 0.9% IV SOLUTION: Freq: Once | INTRAVENOUS | Status: DC

## 2021-06-01 MED ORDER — HEPARIN SODIUM (PORCINE) 1000 UNIT/ML DIALYSIS
10000.0000 [IU] | INTRAMUSCULAR | Status: DC | PRN
  Administered 2021-06-01: 10000 [IU] via INTRAVENOUS_CENTRAL
  Filled 2021-06-01 (×3): qty 10

## 2021-06-01 MED ORDER — MIDODRINE HCL 5 MG PO TABS
10.0000 mg | ORAL_TABLET | ORAL | Status: DC | PRN
  Administered 2021-06-01 – 2021-06-10 (×4): 10 mg via ORAL
  Filled 2021-06-01 (×5): qty 2

## 2021-06-01 NOTE — Progress Notes (Signed)
?[facility] ?Progress Note  ? ?Subjective:   Patient seen and examined at bedside in HD. Hypotension limiting UF so far this AM.  Hemoglobin dropped to 6.4 this AM.  Denies CP, SOB, abdominal pain, n/v/d, weakness, dizziness and fatigue.  Reports being sleepy this AM. ? ?Objective ?Vitals:  ? 06/01/21 0441 06/01/21 0723 06/01/21 0740 06/01/21 0747  ?BP: (!) 95/58 (!) 107/58 (!) 101/52 (!) 93/55  ?Pulse: 65 70 68 65  ?Resp:  '19 16 16  '$ ?Temp: (!) 97.4 ?F (36.3 ?C) 97.7 ?F (36.5 ?C) (!) 97.3 ?F (36.3 ?C)   ?TempSrc: Oral Oral Temporal   ?SpO2: 96% 98% 100%   ?Weight: 116.4 kg  117.3 kg   ?Height:      ? ?Physical Exam ?General:chronically ill appearing, sleepy female in NAD ?Heart:RRR, no mrg ?Lungs:CTAB, nml WOB ?Abdomen:soft, NTND ?Extremities: trace LE edema, woody appearance. LLE dressed ?Dialysis Access: RU AVF +b/t  ? ?Filed Weights  ? 05/30/21 0503 06/01/21 0441 06/01/21 0740  ?Weight: 113.3 kg 116.4 kg 117.3 kg  ? ? ?Intake/Output Summary (Last 24 hours) at 06/01/2021 1324 ?Last data filed at 06/01/2021 4010 ?Gross per 24 hour  ?Intake 963 ml  ?Output 0 ml  ?Net 963 ml  ? ? ?Additional Objective ?Labs: ?Basic Metabolic Panel: ?Recent Labs  ?Lab 05/26/21 ?2148 05/28/21 ?2725 05/29/21 ?3664 05/31/21 ?4034  ?NA 134* 130* 127* 132*  ?K 4.7 5.2* 5.7* 5.2*  ?CL 89* 97* 89* 94*  ?CO2 28  --  19* 22  ?GLUCOSE 282* 221* 207* 212*  ?BUN 43* 70* 80* 64*  ?CREATININE 5.85* 7.90* 8.51* 7.80*  ?CALCIUM 8.9  --  8.7* 8.6*  ?PHOS 5.0*  --  5.0* 4.9*  ? ?Liver Function Tests: ?Recent Labs  ?Lab 05/25/21 ?2105 05/26/21 ?7425 05/26/21 ?1500 05/26/21 ?2148 05/29/21 ?0747 05/31/21 ?0929  ?AST 22 15  --   --   --   --   ?ALT 21 14  --   --   --   --   ?ALKPHOS 145* 119  --   --   --   --   ?BILITOT 0.7 0.4  --   --   --   --   ?PROT 7.7 7.1  --   --   --   --   ?ALBUMIN 2.5* 2.4*   < > 2.9* 2.6* 2.5*  ? < > = values in this interval not displayed.  ? ?No results for input(s): LIPASE, AMYLASE in the last 168  hours. ?CBC: ?Recent Labs  ?Lab 05/26/21 ?2148 05/27/21 ?9563 05/28/21 ?8756 05/29/21 ?4332 05/29/21 ?9518 06/01/21 ?8416  ?WBC 12.9* 13.8*  --  15.0* 15.6* 17.8*  ?HGB 8.4* 7.9*   < > 7.8* 7.6* 6.4*  ?HCT 26.7* 25.5*   < > 24.2* 23.3* 20.1*  ?MCV 96.0 95.9  --  94.2 92.8 97.6  ?PLT 254 254  --  250 237 239  ? < > = values in this interval not displayed.  ? ?Blood Culture ?   ?Component Value Date/Time  ? SDES BLOOD LEFT HAND 05/30/2021 0136  ? SPECREQUEST  05/30/2021 0136  ?  BOTTLES DRAWN AEROBIC AND ANAEROBIC Blood Culture adequate volume  ? CULT  05/30/2021 0136  ?  NO GROWTH 1 DAY ?Performed at [facility], [place] [address] [place], [place] [address] ?  ? REPTSTATUS PENDING 05/30/2021 0136  ? ? ?Cardiac Enzymes: ?No results for input(s): CKTOTAL, CKMB, CKMBINDEX, TROPONINI in the last 168 hours. ?CBG: ?Recent Labs  ?  Lab 05/31/21 ?0622 05/31/21 ?1115 05/31/21 ?1836 05/31/21 ?2108 06/01/21 ?1030  ?GLUCAP 234* 228* 308* 344* 218*  ? ?Iron Studies: No results for input(s): IRON, TIBC, TRANSFERRIN, FERRITIN in the last 72 hours. ?Lab Results  ?Component Value Date  ? INR 1.0 11/14/2018  ? INR 0.90 01/30/2011  ? ?Studies/Results: ?No results found. ? ?Medications: ? sodium chloride    ? ceFEPime (MAXIPIME) IV 1 g (05/31/21 1127)  ? ? sodium chloride   Intravenous Once  ? Chlorhexidine Gluconate Cloth  6 each Topical Q0600  ? Chlorhexidine Gluconate Cloth  6 each Topical Q0600  ? clopidogrel  75 mg Oral Q breakfast  ? insulin aspart  0-5 Units Subcutaneous QHS  ? insulin aspart  0-6 Units Subcutaneous TID WC  ? insulin aspart protamine- aspart  35 Units Subcutaneous BID WC  ? metoprolol succinate  50 mg Oral QHS  ? rosuvastatin  40 mg Oral Daily  ? sodium chloride flush  3 mL Intravenous Q12H  ? sodium chloride flush  3 mL Intravenous Q12H  ? ? ?Dialysis Orders: ?MWF  - GOC ?4hrs, BFR 450, DFR 800,  EDW 110.6kg, 2K/ 2.5Ca, RU AVF,  ?-Heparin 10000 unit IV bolus TIW ? - No ESA 2/2 ?malignancy (no diagnosis - has  stable renal lesion, pulm lesions -> saw onc 4/17, plan is for bronchoscopy with Bx of lung nodules in near future) ?- Calcitriol 2.5 mcg PO qHD ?- Parsabiv 7.5 mg IV qHD  ?  ?Assessment/Plan: ? 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  S/p balloon angioplasty and lasert atherectomy to LATA 4/26, then amputation of L 4th/5th metarsals 05/28/2021 Dr. Donzetta Matters. At high risk for L BKA per VVS note.  1 of 2 Blood Cx + for MRSA. On Vanc/Cefepime. ?Volume overload: S/p extra HD 4/25, now back on usual schedule.Na falling corrected Na 129. Continue lowering volume as tolerated with HD.  ?ESRD: Continue HD per usual MWF schedule. HD today per regular schedule.  ?Mild hyperkalemia: K+in low 5s over weekend.  Given lokelma.  ?Hypertension: BP variable, on metoprolol succinate 100 mg PO QHS. Decrease dose to 50 mg PO q HS. Hypotensive with HD, can utilize midodrine and albumin with HD for BP support.  ?Anemia of ESRD: Hgb 7 on admit, s/p 1U PRBCs 4/25. Hgb drop this AM to 6.4, 2 units pRBC ordered.  Not getting outpatient ESA d/t potential malignancy.   ?Secondary Hyperparathyroidism: Ca/Phos ok. Continue VDRA on MWF.  Unable to give parsabiv not on formulary.  Continue binders. ? Nutrition - Renal diet w/fluid restrictions.  ?DMT2 - per PMD ?RA -humira on hold ?Chronic pain - per PMD ? ?[name], PA-C ?[facility] ?06/01/2021,8:22 AM ? LOS: 6 days  ? ? ?

## 2021-06-01 NOTE — Progress Notes (Addendum)
? ?  Patient currently in HD ?Pending exam and appearence of left foot wound by Dr. Donzetta Matters. ?He and the patient will discuss plan of continued wound car verse BKA ? ? ?A/P  ?Left foot with non healing wound ischemic 4th and 5th toe amputation ?We will observe the wound healing over the weekend for ischemia ?She has a peroneal signal, no active motor and is at high risk of amputation. ? ? ?Roxy Horseman ?PA-C ? ?I have independently interviewed patient and agree with PA assessment and plan above. Foot does not appear to be healing.  Plan left bka tomorrow in OR.  ? ? ? ?Zuha Dejonge C. Donzetta Matters, MD ?[facility] ?Office: [phone] ?Pager: [phone] ? ?

## 2021-06-01 NOTE — Progress Notes (Signed)
?PROGRESS NOTE ? ? ? ?[name]  XHB:[id number] DOB: 1965/12/12 DOA: 05/25/2021 ?PCP: Sandi Mariscal, MD  ? ? 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  She had a chronic wound on her left fifth toe and last 3 days it has been bleeding and draining.  In the emergency room temperature 101.8, tachycardia 140, blood pressure stable.  On 4 L oxygen.  Hemoglobin 7.2.  Lactic acid 2.4.  Left foot x-ray with soft tissue swelling and osteomyelitis fifth phalanx.  Started on antibiotics, orthopedics , vascular and nephrology consulted. ?Fourth and fifth metatarsal amputation 4/27. ? ?Assessment & Plan: ?  ?Sepsis present on admission secondary to left foot osteomyelitis, diabetic foot ulcer with osteomyelitis: ? ?Blood cultures with Staph epidermidis 1 out of 4 bottles that is MRSA this is likely contaminant. ?Enterobacter aerogenes , on cefepime. ?Repeat blood cultures negative so far. Discontinue vancomycin, continue cefepime. ?4/26, angiography, posterior tibial runoff.  Balloon dilatation. ?4/27, fourth and fifth toe ray amputation, trying to preserve the leg, however high risk of transtibial amputation. ?Patient agreeable for transtibial amputation if no chance of healing or complicated postop course. ? ?Essential hypertension: Blood pressure stable.  Home metoprolol. ? ?Hyperlipidemia: On statin. ? ?Rheumatoid arthritis: Takes Humira at home.  On hold due to acute infection. ? ?Anemia of chronic disease: Received 1 unit of PRBC with dialysis 4/25.  Hemoglobin is 6.4.  Getting 2 units of PRBC today. ? ?ESRD on hemodialysis:  ?Receiving multiple hemodialysis to try to optimize volume status.  Receiving dialysis today. ? ?Type 2 diabetes, poorly  controlled with hyperglycemia: ?On home dose of insulin.  Stable today. ? ?Morbid obesity: BMI more than 40.  Patient probably has underlying sleep apnea, she will need outpatient investigations after discharge.  She had attempted sleep study in the past but was unsuccessful.  She could not fall asleep in the lab. ? ? ? ?DVT prophylaxis:   SCDs ? ? ?Code Status: Full code ?Family Communication: None ?Disposition Plan: Status is: Inpatient.  Remains inpatient, inpatient surgical procedures, hemodialysis. ? ? ?Consultants:  ?Orthopedics ?Nephrology ?Vascular surgery ? ?Procedures:  ?None ? ?Antimicrobials:  ?Vancomycin and cefepime 4/24--- 4/30 ?Cefepime 4/24-- ? ? ?Subjective: ? ?Getting dialysis.  Obvious irregular breathing patterns but wakes up fine.  Denied any overnight events.  Waiting to go to floor so that her surgeon can look at her leg. ?Patient also getting 2 units of PRBC with dialysis. ? ? ? ?Objective: ?Vitals:  ? 06/01/21 1035 06/01/21 1100 06/01/21 1110 06/01/21 1130  ?BP:  106/65 106/65 100/66  ?Pulse:  75  73  ?Resp:      ?Temp: 97.6 ?F (36.4 ?C)  97.7 ?F (36.5 ?C)   ?TempSrc:   Temporal   ?SpO2:   98%   ?Weight:      ?Height:      ? ? ?Intake/Output Summary (Last 24 hours) at 06/01/2021 1152 ?Last data filed at 06/01/2021 1110 ?Gross per 24 hour  ?Intake 1353 ml  ?Output 0 ml  ?Net 1353 ml  ? ? ?Filed Weights  ? 05/30/21 0503 06/01/21 0441 06/01/21 0740  ?Weight: 113.3 kg 116.4 kg 117.3 kg  ? ? ?Examination: ? ?General: Looks fairly comfortable receiving dialysis. ? ? ? ? ? ? ?Data Reviewed: I have personally reviewed following labs and  imaging studies ? ?CBC: ?Recent Labs  ?Lab 05/26/21 ?2148 05/27/21 ?0626 05/28/21 ?9485 05/29/21 ?4627 05/29/21 ?0350 06/01/21 ?0938  ?WBC 12.9* 13.8*  --  15.0* 15.6* 17.8*  ?HGB 8.4* 7.9* 8.2* 7.8* 7.6* 6.4*  ?HCT 26.7* 25.5* 24.0* 24.2* 23.3* 20.1*  ?MCV 96.0 95.9  --  94.2 92.8 97.6  ?PLT 254 254  --  250 237 239  ? ?Basic Metabolic Panel: ?Recent Labs  ?Lab  05/26/21 ?1500 05/26/21 ?2148 05/28/21 ?1829 05/29/21 ?9371 05/31/21 ?6967 06/01/21 ?0755  ?NA 132* 134* 130* 127* 132* 131*  ?K 4.1 4.7 5.2* 5.7* 5.2* 5.3*  ?CL 90* 89* 97* 89* 94* 92*  ?CO2 24 28  --  19* 22 22  ?GLUCOSE 297* 282* 221* 207* 212* 243*  ?BUN 34* 43* 70* 80* 64* 75*  ?CREATININE 4.99* 5.85* 7.90* 8.51* 7.80* 9.08*  ?CALCIUM 8.1* 8.9  --  8.7* 8.6* 8.6*  ?PHOS 4.0 5.0*  --  5.0* 4.9* 6.9*  ? ?GFR: ?Estimated Creatinine Clearance: 9.3 mL/min (A) (by C-G formula based on SCr of 9.08 mg/dL (H)). ?Liver Function Tests: ?Recent Labs  ?Lab 05/25/21 ?2105 05/26/21 ?8938 05/26/21 ?1500 05/26/21 ?2148 05/29/21 ?0747 05/31/21 ?1017 06/01/21 ?0755  ?AST 22 15  --   --   --   --   --   ?ALT 21 14  --   --   --   --   --   ?ALKPHOS 145* 119  --   --   --   --   --   ?BILITOT 0.7 0.4  --   --   --   --   --   ?PROT 7.7 7.1  --   --   --   --   --   ?ALBUMIN 2.5* 2.4* 2.4* 2.9* 2.6* 2.5* 2.5*  ? ?No results for input(s): LIPASE, AMYLASE in the last 168 hours. ?No results for input(s): AMMONIA in the last 168 hours. ?Coagulation Profile: ?No results for input(s): INR, PROTIME in the last 168 hours. ?Cardiac Enzymes: ?No results for input(s): CKTOTAL, CKMB, CKMBINDEX, TROPONINI in the last 168 hours. ?BNP (last 3 results) ?No results for input(s): PROBNP in the last 8760 hours. ?HbA1C: ?No results for input(s): HGBA1C in the last 72 hours. ?CBG: ?Recent Labs  ?Lab 05/31/21 ?0622 05/31/21 ?1115 05/31/21 ?1836 05/31/21 ?2108 06/01/21 ?5102  ?GLUCAP 234* 228* 308* 344* 218*  ? ?Lipid Profile: ?No results for input(s): CHOL, HDL, LDLCALC, TRIG, CHOLHDL, LDLDIRECT in the last 72 hours. ? ?Thyroid Function Tests: ?No results for input(s): TSH, T4TOTAL, FREET4, T3FREE, THYROIDAB in the last 72 hours. ?Anemia Panel: ?No results for input(s): VITAMINB12, FOLATE, FERRITIN, TIBC, IRON, RETICCTPCT in the last 72 hours. ?Sepsis Labs: ?Recent Labs  ?Lab 05/25/21 ?2105 05/26/21 ?0040  ?LATICACIDVEN 2.4* 1.9  ? ? ?Recent Results  (from the past 240 hour(s))  ?Blood culture (routine x 2)     Status: Abnormal  ? Collection Time: 05/25/21  9:20 PM  ? Specimen: BLOOD LEFT HAND  ?Result Value Ref Range Status  ? Specimen Description BLOOD LEFT HAND  Final  ? Special Requests   Final  ?  BOTTLES DRAWN AEROBIC AND ANAEROBIC Blood Culture adequate volume  ? Culture  Setup Time   Final  ?  GRAM NEGATIVE RODS ?AEROBIC BOTTLE ONLY ?CRITICAL RESULT CALLED TO, READ BACK BY AND VERIFIED WITH: PHARMD JESSICA MILLEN ON 05/28/21 @ 1900 BY DRT ?Performed at [facility], [place] [address] [place], [place] [address] ?  ?  Culture ENTEROBACTER AEROGENES (A)  Final  ? Report Status 05/30/2021 FINAL  Final  ? Organism ID, Bacteria ENTEROBACTER AEROGENES  Final  ?    Susceptibility  ? Enterobacter aerogenes - MIC*  ?  CEFAZOLIN >=64 RESISTANT Resistant   ?  CEFEPIME <=0.12 SENSITIVE Sensitive   ?  CEFTAZIDIME <=1 SENSITIVE Sensitive   ?  CEFTRIAXONE <=0.25 SENSITIVE Sensitive   ?  CIPROFLOXACIN <=0.25 SENSITIVE Sensitive   ?  GENTAMICIN <=1 SENSITIVE Sensitive   ?  IMIPENEM 1 SENSITIVE Sensitive   ?  TRIMETH/SULFA <=20 SENSITIVE Sensitive   ?  PIP/TAZO <=4 SENSITIVE Sensitive   ?  * ENTEROBACTER AEROGENES  ?Blood Culture ID Panel (Reflexed)     Status: Abnormal  ? Collection Time: 05/25/21  9:20 PM  ?Result Value Ref Range Status  ? Enterococcus faecalis NOT DETECTED NOT DETECTED Final  ? Enterococcus Faecium NOT DETECTED NOT DETECTED Final  ? Listeria monocytogenes NOT DETECTED NOT DETECTED Final  ? Staphylococcus species NOT DETECTED NOT DETECTED Final  ? Staphylococcus aureus (BCID) NOT DETECTED NOT DETECTED Final  ? Staphylococcus epidermidis NOT DETECTED NOT DETECTED Final  ? Staphylococcus lugdunensis NOT DETECTED NOT DETECTED Final  ? Streptococcus species NOT DETECTED NOT DETECTED Final  ? Streptococcus agalactiae NOT DETECTED NOT DETECTED Final  ? Streptococcus pneumoniae NOT DETECTED NOT DETECTED Final  ? Streptococcus pyogenes NOT DETECTED NOT  DETECTED Final  ? A.calcoaceticus-baumannii NOT DETECTED NOT DETECTED Final  ? Bacteroides fragilis NOT DETECTED NOT DETECTED Final  ? Enterobacterales DETECTED (A) NOT DETECTED Final  ?  Comment: Enterobacterales represent

## 2021-06-01 NOTE — Progress Notes (Signed)
Pharmacy Antibiotic Note ? ?[name] is a 56 y.o. female admitted on 05/25/2021 with  wound infection/bacteremia .  Pharmacy has been consulted for cefepime and vancomycin dosing. ? ?On schedule HD MWF, last HD this AM. ? ?Patient growing Klebsiella and 2/4 MRSE in blood. ? ?Plan: ?Continue cefepime 1g IV q 24h  ?Monitor Cx results, VVS and ortho intervention plan to narrow ? ? ?Height: '5\' 8"'$  (172.7 cm) ?Weight: 117.3 kg (258 lb 9.6 oz) ?IBW/kg (Calculated) : 63.9 ? ?Temp (24hrs), Avg:97.8 ?F (36.6 ?C), Min:97.3 ?F (36.3 ?C), Max:98.9 ?F (37.2 ?C) ? ?Recent Labs  ?Lab 05/25/21 ?2105 05/26/21 ?0040 05/26/21 ?9024 05/26/21 ?2148 05/27/21 ?0973 05/28/21 ?5329 05/29/21 ?9242 05/29/21 ?6834 05/31/21 ?1962 06/01/21 ?2297  ?WBC 11.8*  --    < > 12.9* 13.8*  --  15.0* 15.6*  --  17.8*  ?CREATININE 4.67*  --    < > 5.85*  --  7.90*  --  8.51* 7.80* 9.08*  ?LATICACIDVEN 2.4* 1.9  --   --   --   --   --   --   --   --   ? < > = values in this interval not displayed.  ? ?  ?Estimated Creatinine Clearance: 9.3 mL/min (A) (by C-G formula based on SCr of 9.08 mg/dL (H)).   ? ?Allergies  ?Allergen Reactions  ? Bupropion Itching  ? Dilaudid [Hydromorphone] Other (See Comments)  ?  Apnea, required intubation  ? ? ?Antimicrobials this admission: ?Cefepime 4/24 >>  ?Vancomycin 4/24 >> 4/30 ? ?Dose adjustments this admission: ? ? ?Microbiology results: ?4/24 BCx: 2/4 MR s epi, 1/4 Enterobacter aerogenes (R: cefazolin, sens everything else) ?4/27BCID (1/4) Kleb aerogenes ? ?[place] Crane, Pharm D, BCPS, BCCP ?Clinical Pharmacist ? 06/01/2021 1:40 PM  ? ?[facility] pharmacy phone numbers are listed on [email or web address] ? ? ? ? ?

## 2021-06-02 ENCOUNTER — Inpatient Hospital Stay (HOSPITAL_COMMUNITY): Payer: Medicare Other | Admitting: Anesthesiology

## 2021-06-02 ENCOUNTER — Encounter (HOSPITAL_COMMUNITY): Payer: Self-pay | Admitting: Internal Medicine

## 2021-06-02 ENCOUNTER — Other Ambulatory Visit: Payer: Self-pay

## 2021-06-02 ENCOUNTER — Encounter (HOSPITAL_COMMUNITY)
Admission: EM | Disposition: A | Discharge status: Skilled Nursing Facility | Payer: Self-pay | Source: Home / Self Care | Attending: Internal Medicine

## 2021-06-02 DIAGNOSIS — D631 Anemia in chronic kidney disease: Secondary | ICD-10-CM | POA: Diagnosis not present

## 2021-06-02 DIAGNOSIS — S91302A Unspecified open wound, left foot, initial encounter: Secondary | ICD-10-CM

## 2021-06-02 DIAGNOSIS — N186 End stage renal disease: Secondary | ICD-10-CM | POA: Diagnosis not present

## 2021-06-02 DIAGNOSIS — I70262 Atherosclerosis of native arteries of extremities with gangrene, left leg: Secondary | ICD-10-CM | POA: Diagnosis not present

## 2021-06-02 DIAGNOSIS — M86272 Subacute osteomyelitis, left ankle and foot: Secondary | ICD-10-CM | POA: Diagnosis not present

## 2021-06-02 DIAGNOSIS — E1151 Type 2 diabetes mellitus with diabetic peripheral angiopathy without gangrene: Secondary | ICD-10-CM

## 2021-06-02 DIAGNOSIS — Z794 Long term (current) use of insulin: Secondary | ICD-10-CM

## 2021-06-02 DIAGNOSIS — A419 Sepsis, unspecified organism: Secondary | ICD-10-CM | POA: Diagnosis not present

## 2021-06-02 HISTORY — PX: AMPUTATION: SHX166

## 2021-06-02 LAB — POCT I-STAT, CHEM 8
BUN: 40 mg/dL — ABNORMAL HIGH (ref 6–20)
BUN: 44 mg/dL — ABNORMAL HIGH (ref 6–20)
Calcium, Ion: 0.99 mmol/L — ABNORMAL LOW (ref 1.15–1.40)
Calcium, Ion: 1.14 mmol/L — ABNORMAL LOW (ref 1.15–1.40)
Chloride: 95 mmol/L — ABNORMAL LOW (ref 98–111)
Chloride: 94 mmol/L — ABNORMAL LOW (ref 98–111)
Creatinine, Ser: 5.9 mg/dL — ABNORMAL HIGH (ref 0.44–1.00)
Creatinine, Ser: 5.6 mg/dL — ABNORMAL HIGH (ref 0.44–1.00)
Glucose, Bld: 261 mg/dL — ABNORMAL HIGH (ref 70–99)
Glucose, Bld: 223 mg/dL — ABNORMAL HIGH (ref 70–99)
HCT: 23 % — ABNORMAL LOW (ref 36.0–46.0)
HCT: 24 % — ABNORMAL LOW (ref 36.0–46.0)
Hemoglobin: 7.8 g/dL — ABNORMAL LOW (ref 12.0–15.0)
Hemoglobin: 8.2 g/dL — ABNORMAL LOW (ref 12.0–15.0)
Potassium: 4.8 mmol/L (ref 3.5–5.1)
Potassium: 4.5 mmol/L (ref 3.5–5.1)
Sodium: 130 mmol/L — ABNORMAL LOW (ref 135–145)
Sodium: 133 mmol/L — ABNORMAL LOW (ref 135–145)
TCO2: 25 mmol/L (ref 22–32)
TCO2: 30 mmol/L (ref 22–32)

## 2021-06-02 LAB — GLUCOSE, CAPILLARY
Glucose-Capillary: 283 mg/dL — ABNORMAL HIGH (ref 70–99)
Glucose-Capillary: 245 mg/dL — ABNORMAL HIGH (ref 70–99)
Glucose-Capillary: 222 mg/dL — ABNORMAL HIGH (ref 70–99)
Glucose-Capillary: 214 mg/dL — ABNORMAL HIGH (ref 70–99)
Glucose-Capillary: 220 mg/dL — ABNORMAL HIGH (ref 70–99)
Glucose-Capillary: 282 mg/dL — ABNORMAL HIGH (ref 70–99)

## 2021-06-02 LAB — SURGICAL PCR SCREEN
MRSA, PCR: NEGATIVE
Staphylococcus aureus: NEGATIVE

## 2021-06-02 SURGERY — AMPUTATION BELOW KNEE
Anesthesia: Monitor Anesthesia Care | Site: Leg Lower | Laterality: Left

## 2021-06-02 MED ORDER — 0.9 % SODIUM CHLORIDE (POUR BTL) OPTIME
TOPICAL | Status: DC | PRN
  Administered 2021-06-02: 1000 mL

## 2021-06-02 MED ORDER — ALUM & MAG HYDROXIDE-SIMETH 200-200-20 MG/5ML PO SUSP: 15.0000 mL | ORAL | Status: DC | PRN

## 2021-06-02 MED ORDER — ONDANSETRON HCL 4 MG/2ML IJ SOLN
INTRAMUSCULAR | Status: DC | PRN
  Administered 2021-06-02: 4 mg via INTRAVENOUS

## 2021-06-02 MED ORDER — SODIUM CHLORIDE 0.9 % IV SOLN: INTRAVENOUS | Status: DC

## 2021-06-02 MED ORDER — ALBUMIN HUMAN 5 % IV SOLN
INTRAVENOUS | Status: AC
  Filled 2021-06-02: qty 250

## 2021-06-02 MED ORDER — FENTANYL CITRATE (PF) 250 MCG/5ML IJ SOLN
INTRAMUSCULAR | Status: AC
  Filled 2021-06-02: qty 5

## 2021-06-02 MED ORDER — MIDAZOLAM HCL 2 MG/2ML IJ SOLN: 1.0000 mg | Freq: Once | INTRAMUSCULAR | Status: AC

## 2021-06-02 MED ORDER — PROPOFOL 10 MG/ML IV BOLUS
INTRAVENOUS | Status: DC | PRN
  Administered 2021-06-02: 10 mg via INTRAVENOUS

## 2021-06-02 MED ORDER — ALBUMIN HUMAN 5 % IV SOLN: INTRAVENOUS | Status: DC | PRN

## 2021-06-02 MED ORDER — SODIUM CHLORIDE (PF) 0.9 % IJ SOLN
INTRAMUSCULAR | Status: AC
  Filled 2021-06-02: qty 20

## 2021-06-02 MED ORDER — PHENYLEPHRINE 80 MCG/ML (10ML) SYRINGE FOR IV PUSH (FOR BLOOD PRESSURE SUPPORT)
PREFILLED_SYRINGE | INTRAVENOUS | Status: DC | PRN
  Administered 2021-06-02 (×2): 80 ug via INTRAVENOUS
  Administered 2021-06-02: 160 ug via INTRAVENOUS
  Administered 2021-06-02 (×3): 80 ug via INTRAVENOUS
  Administered 2021-06-02: 160 ug via INTRAVENOUS

## 2021-06-02 MED ORDER — ALBUMIN HUMAN 5 % IV SOLN
12.5000 g | Freq: Once | INTRAVENOUS | Status: AC
  Administered 2021-06-02: 12.5 g via INTRAVENOUS

## 2021-06-02 MED ORDER — MIDAZOLAM HCL 2 MG/2ML IJ SOLN
INTRAMUSCULAR | Status: AC
  Filled 2021-06-02: qty 2

## 2021-06-02 MED ORDER — MIDAZOLAM HCL 2 MG/2ML IJ SOLN
INTRAMUSCULAR | Status: AC
  Administered 2021-06-02: 1 mg via INTRAVENOUS
  Filled 2021-06-02: qty 2

## 2021-06-02 MED ORDER — HEPARIN SODIUM (PORCINE) 5000 UNIT/ML IJ SOLN
5000.0000 [IU] | Freq: Three times a day (TID) | INTRAMUSCULAR | Status: DC
  Administered 2021-06-03 – 2021-06-05 (×7): 5000 [IU] via SUBCUTANEOUS
  Filled 2021-06-02 (×7): qty 1

## 2021-06-02 MED ORDER — PROPOFOL 500 MG/50ML IV EMUL
INTRAVENOUS | Status: DC | PRN
  Administered 2021-06-02: 50 ug/kg/min via INTRAVENOUS

## 2021-06-02 MED ORDER — DOCUSATE SODIUM 100 MG PO CAPS
100.0000 mg | ORAL_CAPSULE | Freq: Every day | ORAL | Status: DC
  Administered 2021-06-04 – 2021-06-07 (×4): 100 mg via ORAL
  Filled 2021-06-02 (×6): qty 1

## 2021-06-02 MED ORDER — MORPHINE SULFATE (PF) 2 MG/ML IV SOLN
2.0000 mg | INTRAVENOUS | Status: DC | PRN
  Administered 2021-06-03: 2 mg via INTRAVENOUS
  Filled 2021-06-02: qty 1

## 2021-06-02 MED ORDER — FENTANYL CITRATE (PF) 100 MCG/2ML IJ SOLN
25.0000 ug | Freq: Once | INTRAMUSCULAR | Status: AC
  Administered 2021-06-02: 25 ug via INTRAVENOUS

## 2021-06-02 MED ORDER — ONDANSETRON HCL 4 MG/2ML IJ SOLN
INTRAMUSCULAR | Status: AC
  Filled 2021-06-02: qty 2

## 2021-06-02 MED ORDER — ACETAMINOPHEN 500 MG PO TABS
1000.0000 mg | ORAL_TABLET | Freq: Once | ORAL | Status: AC
  Administered 2021-06-02: 1000 mg via ORAL

## 2021-06-02 MED ORDER — FENTANYL CITRATE (PF) 100 MCG/2ML IJ SOLN
INTRAMUSCULAR | Status: AC
  Filled 2021-06-02: qty 2

## 2021-06-02 MED ORDER — ACETAMINOPHEN 500 MG PO TABS
ORAL_TABLET | ORAL | Status: AC
Start: 2021-06-02 — End: 2021-06-02
  Filled 2021-06-02: qty 2

## 2021-06-02 MED ORDER — FENTANYL CITRATE (PF) 100 MCG/2ML IJ SOLN
INTRAMUSCULAR | Status: AC
  Administered 2021-06-02: 50 ug via INTRAVENOUS
  Filled 2021-06-02: qty 2

## 2021-06-02 MED ORDER — MAGNESIUM SULFATE 2 GM/50ML IV SOLN: 2.0000 g | Freq: Every day | INTRAVENOUS | Status: DC | PRN

## 2021-06-02 MED ORDER — CEFAZOLIN SODIUM-DEXTROSE 2-3 GM-%(50ML) IV SOLR
INTRAVENOUS | Status: DC | PRN
  Administered 2021-06-02: 2 g via INTRAVENOUS

## 2021-06-02 MED ORDER — CHLORHEXIDINE GLUCONATE 0.12 % MT SOLN: 15.0000 mL | Freq: Once | OROMUCOSAL | Status: AC

## 2021-06-02 MED ORDER — POTASSIUM CHLORIDE CRYS ER 20 MEQ PO TBCR: 20.0000 meq | EXTENDED_RELEASE_TABLET | Freq: Every day | ORAL | Status: DC | PRN

## 2021-06-02 MED ORDER — ORAL CARE MOUTH RINSE: 15.0000 mL | Freq: Once | OROMUCOSAL | Status: AC

## 2021-06-02 MED ORDER — PANTOPRAZOLE SODIUM 40 MG PO TBEC
40.0000 mg | DELAYED_RELEASE_TABLET | Freq: Every day | ORAL | Status: DC
  Administered 2021-06-02 – 2021-06-11 (×9): 40 mg via ORAL
  Filled 2021-06-02 (×9): qty 1

## 2021-06-02 MED ORDER — CEFAZOLIN SODIUM 1 G IJ SOLR
INTRAMUSCULAR | Status: AC
  Filled 2021-06-02: qty 20

## 2021-06-02 MED ORDER — INSULIN ASPART 100 UNIT/ML IJ SOLN
0.0000 [IU] | INTRAMUSCULAR | Status: DC | PRN
  Administered 2021-06-02: 3 [IU] via SUBCUTANEOUS
  Filled 2021-06-02: qty 1

## 2021-06-02 MED ORDER — FENTANYL CITRATE (PF) 100 MCG/2ML IJ SOLN: 50.0000 ug | Freq: Once | INTRAMUSCULAR | Status: AC

## 2021-06-02 MED ORDER — CHLORHEXIDINE GLUCONATE 0.12 % MT SOLN
OROMUCOSAL | Status: AC
  Administered 2021-06-02: 15 mL via OROMUCOSAL
  Filled 2021-06-02: qty 15

## 2021-06-02 MED ORDER — ROPIVACAINE HCL 5 MG/ML IJ SOLN
INTRAMUSCULAR | Status: DC | PRN
  Administered 2021-06-02: 10 mL via PERINEURAL
  Administered 2021-06-02: 30 mL via PERINEURAL

## 2021-06-02 SURGICAL SUPPLY — 70 items
BAG COUNTER SPONGE SURGICOUNT (BAG) ×3 IMPLANT
BAG SPNG CNTER NS LX DISP (BAG) ×1
BANDAGE ESMARK 6X9 LF (GAUZE/BANDAGES/DRESSINGS) IMPLANT
BLADE LONG MED 31X9 (MISCELLANEOUS) IMPLANT
BLADE SAW GIGLI 510 (BLADE) ×2 IMPLANT
BLADE SAW SAG 73X25 THK (BLADE) ×1
BLADE SAW SGTL 73X25 THK (BLADE) IMPLANT
BNDG CMPR 9X6 STRL LF SNTH (GAUZE/BANDAGES/DRESSINGS) ×1
BNDG COHESIVE 6X5 TAN STRL LF (GAUZE/BANDAGES/DRESSINGS) ×3 IMPLANT
BNDG ELASTIC 4X5.8 VLCR STR LF (GAUZE/BANDAGES/DRESSINGS) IMPLANT
BNDG ELASTIC 6X5.8 VLCR STR LF (GAUZE/BANDAGES/DRESSINGS) ×1 IMPLANT
BNDG ESMARK 6X9 LF (GAUZE/BANDAGES/DRESSINGS) ×2
BNDG GAUZE ELAST 4 BULKY (GAUZE/BANDAGES/DRESSINGS) ×1 IMPLANT
CANISTER SUCT 3000ML PPV (MISCELLANEOUS) ×3 IMPLANT
CLIP LIGATING EXTRA MED SLVR (CLIP) ×3 IMPLANT
CLIP LIGATING EXTRA SM BLUE (MISCELLANEOUS) ×3 IMPLANT
COVER SURGICAL LIGHT HANDLE (MISCELLANEOUS) ×3 IMPLANT
CUFF TOURN SGL QUICK 34 (TOURNIQUET CUFF)
CUFF TOURN SGL QUICK 42 (TOURNIQUET CUFF) IMPLANT
CUFF TRNQT CYL 34X4.125X (TOURNIQUET CUFF) IMPLANT
DRAIN CHANNEL 19F RND (DRAIN) IMPLANT
DRAPE HALF SHEET 40X57 (DRAPES) ×3 IMPLANT
DRAPE INCISE IOBAN 85X60 (DRAPES) ×1 IMPLANT
DRAPE ORTHO SPLIT 77X108 STRL (DRAPES) ×4
DRAPE STERI IOBAN 125X83 (DRAPES) ×1 IMPLANT
DRAPE SURG ORHT 6 SPLT 77X108 (DRAPES) ×4 IMPLANT
DRSG ADAPTIC 3X8 NADH LF (GAUZE/BANDAGES/DRESSINGS) ×3 IMPLANT
ELECT REM PT RETURN 9FT ADLT (ELECTROSURGICAL) ×2
ELECTRODE REM PT RTRN 9FT ADLT (ELECTROSURGICAL) ×2 IMPLANT
EVACUATOR SILICONE 100CC (DRAIN) IMPLANT
GAUZE PAD ABD 8X10 STRL (GAUZE/BANDAGES/DRESSINGS) ×2 IMPLANT
GAUZE SPONGE 4X4 12PLY STRL (GAUZE/BANDAGES/DRESSINGS) ×3 IMPLANT
GAUZE XEROFORM 5X9 LF (GAUZE/BANDAGES/DRESSINGS) ×1 IMPLANT
GLOVE BIO SURGEON STRL SZ7.5 (GLOVE) ×3 IMPLANT
GLOVE BIOGEL PI IND STRL 6 (GLOVE) IMPLANT
GLOVE BIOGEL PI IND STRL 6.5 (GLOVE) IMPLANT
GLOVE BIOGEL PI IND STRL 8 (GLOVE) IMPLANT
GLOVE BIOGEL PI INDICATOR 6 (GLOVE) ×1
GLOVE BIOGEL PI INDICATOR 6.5 (GLOVE) ×2
GLOVE BIOGEL PI INDICATOR 8 (GLOVE) ×1
GOWN STRL REUS W/ TWL LRG LVL3 (GOWN DISPOSABLE) ×4 IMPLANT
GOWN STRL REUS W/ TWL XL LVL3 (GOWN DISPOSABLE) ×2 IMPLANT
GOWN STRL REUS W/TWL LRG LVL3 (GOWN DISPOSABLE) ×4
GOWN STRL REUS W/TWL XL LVL3 (GOWN DISPOSABLE) ×2
IMMOBILIZER KNEE 20 (SOFTGOODS) ×2 IMPLANT
IMMOBILIZER KNEE 20 THIGH 36 (SOFTGOODS) IMPLANT
KIT BASIN OR (CUSTOM PROCEDURE TRAY) ×3 IMPLANT
KIT TURNOVER KIT B (KITS) ×3 IMPLANT
NS IRRIG 1000ML POUR BTL (IV SOLUTION) ×3 IMPLANT
PACK GENERAL/GYN (CUSTOM PROCEDURE TRAY) ×3 IMPLANT
PAD ARMBOARD 7.5X6 YLW CONV (MISCELLANEOUS) ×6 IMPLANT
STAPLER VISISTAT 35W (STAPLE) ×3 IMPLANT
STOCKINETTE IMPERVIOUS LG (DRAPES) ×3 IMPLANT
SUT BONE WAX W31G (SUTURE) IMPLANT
SUT ETHILON 2 0 PSLX (SUTURE) ×3 IMPLANT
SUT ETHILON 3 0 PS 1 (SUTURE) IMPLANT
SUT SILK 0 TIES 10X30 (SUTURE) ×3 IMPLANT
SUT SILK 2 0 (SUTURE) ×2
SUT SILK 2 0 SH CR/8 (SUTURE) ×4 IMPLANT
SUT SILK 2-0 18XBRD TIE 12 (SUTURE) ×2 IMPLANT
SUT SILK 3 0 (SUTURE)
SUT SILK 3-0 18XBRD TIE 12 (SUTURE) IMPLANT
SUT VIC AB 0 CT1 18XCR BRD 8 (SUTURE) IMPLANT
SUT VIC AB 0 CT1 27 (SUTURE) ×6
SUT VIC AB 0 CT1 27XBRD ANBCTR (SUTURE) IMPLANT
SUT VIC AB 0 CT1 8-18 (SUTURE) ×6
SUT VIC AB 2-0 CT1 18 (SUTURE) ×4 IMPLANT
TOWEL GREEN STERILE (TOWEL DISPOSABLE) ×6 IMPLANT
UNDERPAD 30X36 HEAVY ABSORB (UNDERPADS AND DIAPERS) ×3 IMPLANT
WATER STERILE IRR 1000ML POUR (IV SOLUTION) ×3 IMPLANT

## 2021-06-02 NOTE — Progress Notes (Signed)
[facility] [place] ? ?Subjective  - No new complaints  ? ? ?Objective ?122/67 ?80 ?98.2 ?F (36.8 ?C) (Oral) ?14 ?100% ? ?Intake/Output Summary (Last 24 hours) at 06/02/2021 1036 ?Last data filed at 06/01/2021 1326 ?Gross per 24 hour  ?Intake 555 ml  ?Output -500 ml  ?Net 1055 ml  ? ? ?Left dressing clean and dry  ?No toe motor and foot is cool to touch ?Lungs non labored breathing ?General no acute distress ? ?Assessment/Planning: ? ?Left foot with non healing wound ischemic 4th and 5th toe amputation ?Unfortunately, this remains ischemic necessitating below knee amputation.  ?[name] is aware even this will have difficulty healing due to her comorbidities and peripheral arterial disease.  ?I would not be surprised if she required high revision later. At this time, [name] asked to pursue BKA to increase her likelihood of ambulating in the future with a prothesis .  ? ?[name] ?06/02/2021 ?10:36 AM ?-- ? ?Laboratory ?Lab Results: ?Recent Labs  ?  06/01/21 ?6962 06/02/21 ?1020  ?WBC 17.8*  --   ?HGB 6.4* 7.8*  ?HCT 20.1* 23.0*  ?PLT 239  --   ? ? ?BMET ?Recent Labs  ?  05/31/21 ?0929 06/01/21 ?0755 06/02/21 ?1020  ?NA 132* 131* 130*  ?K 5.2* 5.3* 4.8  ?CL 94* 92* 95*  ?CO2 22 22  --   ?GLUCOSE 212* 243* 261*  ?BUN 64* 75* 40*  ?CREATININE 7.80* 9.08* 5.90*  ?CALCIUM 8.6* 8.6*  --   ? ? ? ?COAG ?Lab Results  ?Component Value Date  ? INR 1.0 11/14/2018  ? INR 0.90 01/30/2011  ? ?No results found for: PTT ? ? ? ?

## 2021-06-02 NOTE — Transfer of Care (Signed)
Immediate Anesthesia Transfer of Care Note ? ?Patient: [name] ? ?Procedure(s) Performed: AMPUTATION BELOW KNEE (Left: Leg Lower) ? ?Patient Location: PACU ? ?Anesthesia Type:MAC and Regional ? ?Level of Consciousness: awake, alert  and oriented ? ?Airway & Oxygen Therapy: Patient Spontanous Breathing and Patient connected to face mask oxygen ? ?Post-op Assessment: Report given to RN and Post -op Vital signs reviewed and stable ? ?Post vital signs: Reviewed and stable ? ?Last Vitals:  ?Vitals Value Taken Time  ?BP 95/51 06/02/21 1214  ?Temp    ?Pulse 70 06/02/21 1213  ?Resp 16 06/02/21 1215  ?SpO2 98 % 06/02/21 1213  ?Vitals shown include unvalidated device data. ? ?Last Pain:  ?Vitals:  ? 06/02/21 1030  ?TempSrc:   ?PainSc: 0-No pain  ?   ? ?Patients Stated Pain Goal: 4 (05/28/21 0846) ? ?Complications: No notable events documented. ?

## 2021-06-02 NOTE — Progress Notes (Signed)
?[facility] ?Progress Note  ? ?Subjective:    ?Had dialysis yesterday -minimal UF. Transfused 2 units prbcs with HD.  ?VVS planning for BKA.  ? ?Objective ?Vitals:  ? 06/01/21 2225 06/02/21 0017 06/02/21 5170 06/02/21 0743  ?BP: 120/61 (!) 114/54 120/64 (!) 102/55  ?Pulse: 84 80 86 76  ?Resp:  '18 18 17  '$ ?Temp:  98.6 ?F (37 ?C) 98.4 ?F (36.9 ?C) 98.2 ?F (36.8 ?C)  ?TempSrc:  Oral Oral Oral  ?SpO2:  92% 98% 97%  ?Weight:      ?Height:      ? ?Physical Exam ?General: chronically ill appearing, sleepy female in NAD ?Heart: RRR, no mrg ?Lungs: CTAB, nml WOB ?Abdomen: soft, NTND ?Extremities: trace LE edema, woody appearance. LLE dressed ?Dialysis Access: RU AVF +b/t  ? ?Filed Weights  ? 06/01/21 0441 06/01/21 0740 06/01/21 1157  ?Weight: 116.4 kg 117.3 kg 117.3 kg  ? ? ?Intake/Output Summary (Last 24 hours) at 06/02/2021 1005 ?Last data filed at 06/01/2021 1326 ?Gross per 24 hour  ?Intake 870 ml  ?Output -500 ml  ?Net 1370 ml  ? ? ? ?Additional Objective ?Labs: ?Basic Metabolic Panel: ?Recent Labs  ?Lab 05/29/21 ?0174 05/31/21 ?0929 06/01/21 ?9449  ?NA 127* 132* 131*  ?K 5.7* 5.2* 5.3*  ?CL 89* 94* 92*  ?CO2 19* 22 22  ?GLUCOSE 207* 212* 243*  ?BUN 80* 64* 75*  ?CREATININE 8.51* 7.80* 9.08*  ?CALCIUM 8.7* 8.6* 8.6*  ?PHOS 5.0* 4.9* 6.9*  ? ? ?Liver Function Tests: ?Recent Labs  ?Lab 05/29/21 ?6759 05/31/21 ?0929 06/01/21 ?1638  ?ALBUMIN 2.6* 2.5* 2.5*  ? ? ?No results for input(s): LIPASE, AMYLASE in the last 168 hours. ?CBC: ?Recent Labs  ?Lab 05/26/21 ?2148 05/27/21 ?4665 05/28/21 ?9935 05/29/21 ?7017 05/29/21 ?7939 06/01/21 ?0300  ?WBC 12.9* 13.8*  --  15.0* 15.6* 17.8*  ?HGB 8.4* 7.9*   < > 7.8* 7.6* 6.4*  ?HCT 26.7* 25.5*   < > 24.2* 23.3* 20.1*  ?MCV 96.0 95.9  --  94.2 92.8 97.6  ?PLT 254 254  --  250 237 239  ? < > = values in this interval not displayed.  ? ? ?Blood Culture ?   ?Component Value Date/Time  ? SDES BLOOD LEFT HAND 05/30/2021 0136  ? SPECREQUEST  05/30/2021 0136  ?  BOTTLES DRAWN  AEROBIC AND ANAEROBIC Blood Culture adequate volume  ? CULT  05/30/2021 0136  ?  NO GROWTH 2 DAYS ?Performed at [facility], [place] [address] [place], [place] [address] ?  ? REPTSTATUS PENDING 05/30/2021 0136  ? ? ?Cardiac Enzymes: ?No results for input(s): CKTOTAL, CKMB, CKMBINDEX, TROPONINI in the last 168 hours. ?CBG: ?Recent Labs  ?Lab 06/01/21 ?1239 06/01/21 ?1647 06/01/21 ?2106 06/02/21 ?0762 06/02/21 ?0949  ?GLUCAP 186* 221* 297* 283* 245*  ? ? ?Iron Studies: No results for input(s): IRON, TIBC, TRANSFERRIN, FERRITIN in the last 72 hours. ?Lab Results  ?Component Value Date  ? INR 1.0 11/14/2018  ? INR 0.90 01/30/2011  ? ?Studies/Results: ?No results found. ? ?Medications: ? [MAR Hold] sodium chloride    ? [MAR Hold] ceFEPime (MAXIPIME) IV 1 g (06/02/21 0859)  ? ? [MAR Hold] sodium chloride   Intravenous Once  ? chlorhexidine      ? [MAR Hold] Chlorhexidine Gluconate Cloth  6 each Topical Q0600  ? [MAR Hold] Chlorhexidine Gluconate Cloth  6 each Topical Q0600  ? [MAR Hold] clopidogrel  75 mg Oral Q breakfast  ? fentaNYL      ? [  MAR Hold] insulin aspart  0-5 Units Subcutaneous QHS  ? [MAR Hold] insulin aspart  0-6 Units Subcutaneous TID WC  ? [MAR Hold] insulin aspart protamine- aspart  35 Units Subcutaneous BID WC  ? [MAR Hold] metoprolol succinate  50 mg Oral QHS  ? midazolam      ? [MAR Hold] rosuvastatin  40 mg Oral Daily  ? [MAR Hold] sodium chloride flush  3 mL Intravenous Q12H  ? [MAR Hold] sodium chloride flush  3 mL Intravenous Q12H  ? ? ?Dialysis Orders: ?MWF  - GOC ?4hrs, BFR 450, DFR 800,  EDW 110.6kg, 2K/ 2.5Ca, RU AVF,  ?-Heparin 10000 unit IV bolus TIW ? - No ESA 2/2 ?malignancy (no diagnosis - has stable renal lesion, pulm lesions -> saw onc 4/17, plan is for bronchoscopy with Bx of lung nodules in near future) ?- Calcitriol 2.5 mcg PO qHD ?- Parsabiv 7.5 mg IV qHD  ?  ?Assessment/Plan: ? 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  S/p balloon angioplasty and lasert atherectomy to LATA 4/26, then amputation of L 4th/5th metarsals 05/28/2021 Dr. Donzetta Matters.  1 of 2 Blood Cx + for MRSA. On Vanc/Cefepime. For L BKA per VVS.  ?Volume overload:  Continue lowering volume as tolerated with HD.  ?ESRD: Continue HD per usual MWF schedule. Next HD 5/3.  ?Mild hyperkalemia: K+in low 5s over weekend.  Given lokelma.  ?Hypertension: BP variable, on metoprolol succinate 100 mg PO QHS. Decrease dose to 50 mg PO q HS. Hypotensive with HD, can utilize midodrine and albumin with HD for BP support.  ?Anemia of ESRD: Hgb 7 on admit, s/p 1U PRBCs 4/25, 2 units pRBC 5/1.   Not getting outpatient ESA d/t potential malignancy.   ?Secondary Hyperparathyroidism: Ca/Phos ok. Continue VDRA on MWF.  Unable to give parsabiv not on formulary.  Continue binders. ? Nutrition - Renal diet w/fluid restrictions.  ?DMT2 - per PMD ?RA -humira on hold ?Chronic pain - per PMD ? ?Lynnda Child PA-C ?[facility] ?06/02/2021,10:07 AM ? ? ?

## 2021-06-02 NOTE — Op Note (Signed)
? ? ?  NAME: [name]    MRN: [id number] ?DOB: [birth year]    DATE OF OPERATION: 06/02/2021 ? ?PREOP DIAGNOSIS:   ? ?Left foot wound with unreconstructable vascular disease ? ?POSTOP DIAGNOSIS:   ? ?Same ? ?PROCEDURE:  ?  ?Left below-knee amputation ? ?SURGEON: Broadus John  ? ?ANESTHESIA: Moderate, block ? ?EBL: 250 mL ? ?INDICATIONS:  ? ? [name] is a 56 y.o. female with nonhealing fourth and fifth ischemic toes of the left foot.  She underwent revascularization attempt which was unsuccessful.  Initially, Gracelin underwent fourth/fifth ray amputation, however this demonstrated poor bleeding, and poor wound healing over the course of several days.  After discussing the risk and benefits of below-knee amputation, [name] elected to proceed. ? ?She is aware that I am worried that the BKA incision may not heal due to her end-stage renal failure, peripheral arterial disease, diabetes.  She has significant edema in the leg as well which will also affect postoperative healing. ? ?FINDINGS:  ? ?Significant edema in the left lower extremity ?Gastrocnemius muscle with pale coloration, very little bleeding.  Soleus with healthy bleeding. ? ?TECHNIQUE:  ? ?After informed consent was obtained, the patient was brought to the OR laid in the supine position.  Pt was blocked and moderate anesthesia was induced, and the patient's left leg was prepped and draped in standard fashion. ? ?Incisions were marked for a below the knee amputation about 10 cm below the tibial tuberosity.  Tourniquet was placed and inflated.  The skin, subcutaneous tissue, muscle and fascia were divided sharply.  Posterior compartment tissue beds appeared pale, deep posterior compartment with healthy bleeding.  There was significant subcutaneous edema extending through the adipose tissue.  The tibia and fibula were dissected free of their attachments. Periosteal elevators were used to dissect these cephalad. The fibula was cut with a rib  cutter cephalad to the skin incision. The tibia was transected with an stryker saw. The anterior aspect of the bone was bevelled. All sharp aspects of the bone edge were smoothed with a bone rasp. The posterior flap was cut with an amputation knife. Hemostasis was achieved with 3-0 silk stick ties and electrocautery. The flap was measured and trimmed to fit the anterior skin border. The flap was irrigated with copious warm saline. The fascia was approximated with interrupted 2-0 vicryl suture. The skin was brought together with staples. A dry sterile dressing was applied.  ? ?Anesthesia was successfully terminated. The patient was brought to the recovery room in stable condition.  ? ?I am concerned, due to the poor coloration in the posterior compartment that the below-knee amputation may not heal.  Her peripheral arterial disease coupled with end-stage renal disease, diabetes, and significant edema, places the amputation at high risk of breakdown. ? ?[name], MD ?[facility] ?DATE OF DICTATION:   06/02/2021 ? ?

## 2021-06-02 NOTE — Progress Notes (Signed)
Inpatient Rehabilitation Admissions Coordinator  ? ?Rehab consult received due to BKA. Current payor trends are that CIR will not be approved for this diagnosis. Other rehab venues to be pursued. We will sign off. ? ?Danne Baxter, RN, MSN ?Rehab Admissions Coordinator ?[phone] ?06/02/2021 3:04 PM ? ?

## 2021-06-02 NOTE — Anesthesia Preprocedure Evaluation (Addendum)
Anesthesia Evaluation  ?Patient identified by MRN, date of birth, ID band ?Patient awake ? ? ? ?Reviewed: ?Allergy & Precautions, NPO status , Patient's Chart, lab work & pertinent test results, reviewed documented beta blocker date and time  ? ?Airway ?Mallampati: III ? ?TM Distance: >3 FB ?Neck ROM: Full ? ? ? Dental ? ?(+) Teeth Intact, Dental Advisory Given ?  ?Pulmonary ?sleep apnea , Current Smoker and Patient abstained from smoking.,  ?  ?Pulmonary exam normal ?breath sounds clear to auscultation ? ? ? ? ? ? Cardiovascular ?hypertension, Pt. on home beta blockers ?+ CAD, + Peripheral Vascular Disease and +CHF  ?Normal cardiovascular exam+ Valvular Problems/Murmurs MR  ?Rhythm:Regular Rate:Normal ? ? ?  ?Neuro/Psych ? Headaches, PSYCHIATRIC DISORDERS Anxiety  Neuromuscular disease   ? GI/Hepatic ?negative GI ROS, Neg liver ROS,   ?Endo/Other  ?diabetes, Insulin DependentMorbid obesity ? Renal/GU ?ESRF and DialysisRenal disease  ? ?  ?Musculoskeletal ? ?(+) Arthritis , Rheumatoid disorders,   ? Abdominal ?  ?Peds ? Hematology ? ?(+) Blood dyscrasia, anemia ,   ?Anesthesia Other Findings ?Day of surgery medications reviewed with the patient. ? Reproductive/Obstetrics ? ?  ? ? ? ? ? ? ? ? ? ? ? ? ? ?  ?  ? ? ? ? ? ? ? ? ?Anesthesia Physical ?Anesthesia Plan ? ?ASA: 3 ? ?Anesthesia Plan: Regional and MAC  ? ?Post-op Pain Management: Tylenol PO (pre-op)*  ? ?Induction: Intravenous ? ?PONV Risk Score and Plan: 1 ? ?Airway Management Planned: Simple Face Mask ? ?Additional Equipment:  ? ?Intra-op Plan:  ? ?Post-operative Plan:  ? ?Informed Consent: I have reviewed the patients History and Physical, chart, labs and discussed the procedure including the risks, benefits and alternatives for the proposed anesthesia with the patient or authorized representative who has indicated his/her understanding and acceptance.  ? ? ? ?Dental advisory given ? ?Plan Discussed with: CRNA ? ?Anesthesia  Plan Comments:   ? ? ? ? ? ? ?Anesthesia Quick Evaluation ? ?

## 2021-06-02 NOTE — Anesthesia Procedure Notes (Signed)
Procedure Name: Beadle ?Date/Time: 06/02/2021 10:50 AM ?Performed by: Harden Mo, CRNA ?Pre-anesthesia Checklist: Patient identified, Emergency Drugs available, Suction available and Patient being monitored ?Patient Re-evaluated:Patient Re-evaluated prior to induction ?Oxygen Delivery Method: Simple face mask ?Preoxygenation: Pre-oxygenation with 100% oxygen ?Induction Type: IV induction ?Placement Confirmation: positive ETCO2 and breath sounds checked- equal and bilateral ?Dental Injury: Teeth and Oropharynx as per pre-operative assessment  ? ? ? ? ?

## 2021-06-02 NOTE — Anesthesia Procedure Notes (Signed)
Anesthesia Regional Block: Popliteal block  ? ?Pre-Anesthetic Checklist: , timeout performed,  Correct Patient, Correct Site, Correct Laterality,  Correct Procedure, Correct Position, site marked,  Risks and benefits discussed,  Surgical consent,  Pre-op evaluation,  At surgeon's request and post-op pain management ? ?Laterality: Left ? ?Prep: chloraprep     ?  ?Needles:  ?Injection technique: Single-shot ? ?Needle Type: Echogenic Needle   ? ? ?Needle Length: 9cm  ?Needle Gauge: 21  ? ? ? ?Additional Needles: ? ? ?Procedures:,,,, ultrasound used (permanent image in chart),,    ?Narrative:  ?Start time: 06/02/2021 10:08 AM ?End time: 06/02/2021 10:15 AM ?Injection made incrementally with aspirations every 5 mL. ? ?Performed by: Personally  ?Anesthesiologist: Santa Lighter, MD ? ?Additional Notes: ?No pain on injection. No increased resistance to injection. Injection made in 5cc increments.  Good needle visualization.  Patient tolerated procedure well. ? ? ? ? ?

## 2021-06-02 NOTE — Progress Notes (Signed)
?PROGRESS NOTE ? ? ? ?[name]  TAV:[id number] DOB: 1965-12-04 DOA: 05/25/2021 ?PCP: Sandi Mariscal, MD  ? ? 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  She had a chronic wound on her left fifth toe and last 3 days it has been bleeding and draining.  In the emergency room temperature 101.8, tachycardia 140, blood pressure stable.  On 4 L oxygen.  Hemoglobin 7.2.  Lactic acid 2.4.  Left foot x-ray with soft tissue swelling and osteomyelitis fifth phalanx.  Started on antibiotics, orthopedics , vascular and nephrology consulted. ?Fourth and fifth metatarsal amputation 4/27. 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 ? ?Assessment & Plan: ?  ?Sepsis present on admission secondary to left foot osteomyelitis, diabetic foot ulcer with osteomyelitis: ? ?Blood cultures with Staph epidermidis 1 out of 4 bottles that is MRSA this is likely contaminant. ?Enterobacter aerogenes , on cefepime. ?Repeat blood cultures negative so far.  Vancomycin discontinued.  Continue cefepime. ?4/26, angiography, posterior tibial runoff.  Balloon dilatation. ?4/27, fourth and fifth toe ray amputation, trying to preserve the leg, however high risk of transtibial amputation. ?5/2, left below-knee amputation, reportedly edematous and unhealthy tissue. ?Continue cefepime for now. ? ?Essential hypertension: Blood pressure stable.  Home metoprolol. ? ?Hyperlipidemia: On statin. ? ?Rheumatoid arthritis: Takes Humira at home.  On hold due to acute infection. ? ?Anemia of chronic disease: Received 1 unit of PRBC with dialysis 4/25.  2 units PRBC with hemodialysis on 5/1.  Hemoglobin 8.2.  ? ?ESRD on hemodialysis:  ?Receiving multiple hemodialysis to try to optimize volume status.  Next  dialysis tomorrow. ? ?Type 2 diabetes, poorly controlled with hyperglycemia: ?On home dose of insulin.  Stable today. ? ?Morbid obesity: BMI more than 40.  Patient probably has underlying sleep apnea, she will need outpatient investigations after discharge.  She had attempted sleep study in the past but was unsuccessful.  She could not fall asleep in the lab. ? ? ? ?DVT prophylaxis: heparin injection 5,000 Units Start: 06/03/21 0600 ?SCD's Start: 06/02/21 1416  SCDs ? ? ?Code Status: Full code ?Family Communication: None ?Disposition Plan: Status is: Inpatient.  Remains inpatient, inpatient surgical procedures, hemodialysis. ? ? ?Consultants:  ?Orthopedics ?Nephrology ?Vascular surgery ? ?Procedures:  ?None ? ?Antimicrobials:  ?Vancomycin and cefepime 4/24--- 4/30 ?Cefepime 4/24-- ? ? ?Subjective: ? ?I examined patient in the morning rounds before she is going for surgery.  Denies any complaints.  She was asking about the surgical procedure and I explained. ?By the time this note is created, she is back from surgery.  BKA stump was closed, however surgery notes unhealthy muscles.  High risk of dehiscence and above-knee amputation. ? ? ? ?Objective: ?Vitals:  ? 06/02/21 1325 06/02/21 1340 06/02/21 1355 06/02/21 1409  ?BP: (!) 96/51 (!) 85/57 (!) 95/55 (!) 95/52  ?Pulse: 66 (!) 58 66 65  ?Resp: '14 19 18 18  '$ ?Temp:   97.8 ?F (36.6 ?C) 97.6 ?F (36.4 ?C)  ?TempSrc:    Oral  ?SpO2: 100% 100% 100% 93%  ?Weight:      ?Height:      ? ? ?Intake/Output Summary (Last 24 hours) at 06/02/2021 1426 ?Last data filed at 06/02/2021 1150 ?Gross per 24 hour  ?Intake 800 ml  ?Output 250 ml  ?Net 550 ml  ? ? ?Filed Weights  ? 06/01/21  3818 06/01/21 0740 06/01/21 1157  ?Weight: 116.4 kg 117.3 kg 117.3 kg  ? ? ?Examination: ? ?General: Looks fairly comfortable at rest.  Talking on the phone with the family. ?Cardiovascular: S1-S2 normal.  Regular rate rhythm. ?Respiratory: Bilateral clear.  On room air. ?Gastrointestinal: Soft.   Nontender. ?Ext: Multiple digits amputation of the upper extremities. ?Dressing on the left foot.  Edematous and chronic ischemic changes both foot. ?Neuro: Intact. ?Musculoskeletal: No deformities. ? ? ?Data Reviewed: I have personally reviewed following labs and imaging studies ? ?CBC: ?Recent Labs  ?Lab 05/26/21 ?2148 05/27/21 ?2993 05/28/21 ?7169 05/29/21 ?6789 05/29/21 ?3810 06/01/21 ?1751 06/02/21 ?1020 06/02/21 ?1243  ?WBC 12.9* 13.8*  --  15.0* 15.6* 17.8*  --   --   ?HGB 8.4* 7.9*   < > 7.8* 7.6* 6.4* 7.8* 8.2*  ?HCT 26.7* 25.5*   < > 24.2* 23.3* 20.1* 23.0* 24.0*  ?MCV 96.0 95.9  --  94.2 92.8 97.6  --   --   ?PLT 254 254  --  250 237 239  --   --   ? < > = values in this interval not displayed.  ? ?Basic Metabolic Panel: ?Recent Labs  ?Lab 05/26/21 ?1500 05/26/21 ?2148 05/28/21 ?0258 05/29/21 ?5277 05/31/21 ?8242 06/01/21 ?0755 06/02/21 ?1020 06/02/21 ?1243  ?NA 132* 134*   < > 127* 132* 131* 130* 133*  ?K 4.1 4.7   < > 5.7* 5.2* 5.3* 4.8 4.5  ?CL 90* 89*   < > 89* 94* 92* 95* 94*  ?CO2 24 28  --  19* 22 22  --   --   ?GLUCOSE 297* 282*   < > 207* 212* 243* 261* 223*  ?BUN 34* 43*   < > 80* 64* 75* 40* 44*  ?CREATININE 4.99* 5.85*   < > 8.51* 7.80* 9.08* 5.90* 5.60*  ?CALCIUM 8.1* 8.9  --  8.7* 8.6* 8.6*  --   --   ?PHOS 4.0 5.0*  --  5.0* 4.9* 6.9*  --   --   ? < > = values in this interval not displayed.  ? ?GFR: ?Estimated Creatinine Clearance: 15.1 mL/min (A) (by C-G formula based on SCr of 5.6 mg/dL (H)). ?Liver Function Tests: ?Recent Labs  ?Lab 05/26/21 ?1500 05/26/21 ?2148 05/29/21 ?0747 05/31/21 ?3536 06/01/21 ?0755  ?ALBUMIN 2.4* 2.9* 2.6* 2.5* 2.5*  ? ?No results for input(s): LIPASE, AMYLASE in the last 168 hours. ?No results for input(s): AMMONIA in the last 168 hours. ?Coagulation Profile: ?No results for input(s): INR, PROTIME in the last 168 hours. ?Cardiac Enzymes: ?No results for input(s): CKTOTAL, CKMB, CKMBINDEX, TROPONINI in the last 168 hours. ?BNP (last 3 results) ?No results for  input(s): PROBNP in the last 8760 hours. ?HbA1C: ?No results for input(s): HGBA1C in the last 72 hours. ?CBG: ?Recent Labs  ?Lab 06/01/21 ?2106 06/02/21 ?1443 06/02/21 ?1540 06/02/21 ?1217 06/02/21 ?1407  ?GLUCAP 297* 283* 245* 222* 214*  ? ?Lipid Profile: ?No results for input(s): CHOL, HDL, LDLCALC, TRIG, CHOLHDL, LDLDIRECT in the last 72 hours. ? ?Thyroid Function Tests: ?No results for input(s): TSH, T4TOTAL, FREET4, T3FREE, THYROIDAB in the last 72 hours. ?Anemia Panel: ?No results for input(s): VITAMINB12, FOLATE, FERRITIN, TIBC, IRON, RETICCTPCT in the last 72 hours. ?Sepsis Labs: ?No results for input(s): PROCALCITON, LATICACIDVEN in the last 168 hours. ? ? ?Recent Results (from the past 240 hour(s))  ?Blood culture (routine x 2)     Status: Abnormal  ? Collection Time: 05/25/21  9:20 PM  ? Specimen:  BLOOD LEFT HAND  ?Result Value Ref Range Status  ? Specimen Description BLOOD LEFT HAND  Final  ? Special Requests   Final  ?  BOTTLES DRAWN AEROBIC AND ANAEROBIC Blood Culture adequate volume  ? Culture  Setup Time   Final  ?  GRAM NEGATIVE RODS ?AEROBIC BOTTLE ONLY ?CRITICAL RESULT CALLED TO, READ BACK BY AND VERIFIED WITH: PHARMD JESSICA MILLEN ON 05/28/21 @ 1900 BY DRT ?Performed at [facility], [place] [address], [place], [place] [address] ?  ? Culture ENTEROBACTER AEROGENES (A)  Final  ? Report Status 05/30/2021 FINAL  Final  ? Organism ID, Bacteria ENTEROBACTER AEROGENES  Final  ?    Susceptibility  ? Enterobacter aerogenes - MIC*  ?  CEFAZOLIN >=64 RESISTANT Resistant   ?  CEFEPIME <=0.12 SENSITIVE Sensitive   ?  CEFTAZIDIME <=1 SENSITIVE Sensitive   ?  CEFTRIAXONE <=0.25 SENSITIVE Sensitive   ?  CIPROFLOXACIN <=0.25 SENSITIVE Sensitive   ?  GENTAMICIN <=1 SENSITIVE Sensitive   ?  IMIPENEM 1 SENSITIVE Sensitive   ?  TRIMETH/SULFA <=20 SENSITIVE Sensitive   ?  PIP/TAZO <=4 SENSITIVE Sensitive   ?  * ENTEROBACTER AEROGENES  ?Blood Culture ID Panel (Reflexed)     Status: Abnormal  ? Collection Time:  05/25/21  9:20 PM  ?Result Value Ref Range Status  ? Enterococcus faecalis NOT DETECTED NOT DETECTED Final  ? Enterococcus Faecium NOT DETECTED NOT DETECTED Final  ? Listeria monocytogenes NOT DETECTED NOT DETECTED Final  ? Sta

## 2021-06-02 NOTE — Anesthesia Procedure Notes (Signed)
Anesthesia Regional Block: Adductor canal block  ? ?Pre-Anesthetic Checklist: , timeout performed,  Correct Patient, Correct Site, Correct Laterality,  Correct Procedure, Correct Position, site marked,  Risks and benefits discussed,  Surgical consent,  Pre-op evaluation,  At surgeon's request and post-op pain management ? ?Laterality: Left ? ?Prep: chloraprep     ?  ?Needles:  ?Injection technique: Single-shot ? ?Needle Type: Echogenic Needle   ? ? ?Needle Length: 9cm  ?Needle Gauge: 21  ? ? ? ?Additional Needles: ? ? ?Procedures:,,,, ultrasound used (permanent image in chart),,    ?Narrative:  ?Start time: 06/02/2021 10:15 AM ?End time: 06/02/2021 10:19 AM ?Injection made incrementally with aspirations every 5 mL. ? ?Performed by: Personally  ?Anesthesiologist: Santa Lighter, MD ? ?Additional Notes: ?No pain on injection. No increased resistance to injection. Injection made in 5cc increments.  Good needle visualization.  Patient tolerated procedure well. ? ? ? ? ?

## 2021-06-03 ENCOUNTER — Encounter (HOSPITAL_COMMUNITY): Payer: Self-pay | Admitting: Vascular Surgery

## 2021-06-03 DIAGNOSIS — A498 Other bacterial infections of unspecified site: Secondary | ICD-10-CM

## 2021-06-03 DIAGNOSIS — I959 Hypotension, unspecified: Secondary | ICD-10-CM

## 2021-06-03 DIAGNOSIS — M86272 Subacute osteomyelitis, left ankle and foot: Secondary | ICD-10-CM | POA: Diagnosis not present

## 2021-06-03 LAB — PREPARE RBC (CROSSMATCH)

## 2021-06-03 LAB — CBC
HCT: 20 % — ABNORMAL LOW (ref 36.0–46.0)
Hemoglobin: 6.1 g/dL — CL (ref 12.0–15.0)
MCH: 29.3 pg (ref 26.0–34.0)
MCHC: 30.5 g/dL (ref 30.0–36.0)
MCV: 96.2 fL (ref 80.0–100.0)
Platelets: 247 10*3/uL (ref 150–400)
RBC: 2.08 MIL/uL — ABNORMAL LOW (ref 3.87–5.11)
RDW: 16.9 % — ABNORMAL HIGH (ref 11.5–15.5)
WBC: 10.6 10*3/uL — ABNORMAL HIGH (ref 4.0–10.5)
nRBC: 0.2 % (ref 0.0–0.2)

## 2021-06-03 LAB — BASIC METABOLIC PANEL
Anion gap: 14 (ref 5–15)
BUN: 44 mg/dL — ABNORMAL HIGH (ref 6–20)
CO2: 23 mmol/L (ref 22–32)
Calcium: 8.3 mg/dL — ABNORMAL LOW (ref 8.9–10.3)
Chloride: 93 mmol/L — ABNORMAL LOW (ref 98–111)
Creatinine, Ser: 6.49 mg/dL — ABNORMAL HIGH (ref 0.44–1.00)
GFR, Estimated: 7 mL/min — ABNORMAL LOW (ref >60)
Glucose, Bld: 308 mg/dL — ABNORMAL HIGH (ref 70–99)
Potassium: 4.7 mmol/L (ref 3.5–5.1)
Sodium: 130 mmol/L — ABNORMAL LOW (ref 135–145)

## 2021-06-03 LAB — GLUCOSE, CAPILLARY
Glucose-Capillary: 265 mg/dL — ABNORMAL HIGH (ref 70–99)
Glucose-Capillary: 129 mg/dL — ABNORMAL HIGH (ref 70–99)
Glucose-Capillary: 140 mg/dL — ABNORMAL HIGH (ref 70–99)
Glucose-Capillary: 150 mg/dL — ABNORMAL HIGH (ref 70–99)

## 2021-06-03 LAB — SURGICAL PATHOLOGY

## 2021-06-03 LAB — HEMOGLOBIN AND HEMATOCRIT, BLOOD
HCT: 22.6 % — ABNORMAL LOW (ref 36.0–46.0)
Hemoglobin: 7.4 g/dL — ABNORMAL LOW (ref 12.0–15.0)

## 2021-06-03 MED ORDER — SODIUM CHLORIDE 0.9% IV SOLUTION: Freq: Once | INTRAVENOUS | Status: DC

## 2021-06-03 MED ORDER — PENTAFLUOROPROP-TETRAFLUOROETH EX AERO: 1.0000 "application " | INHALATION_SPRAY | CUTANEOUS | Status: DC | PRN

## 2021-06-03 MED ORDER — NALOXONE HCL 0.4 MG/ML IJ SOLN
0.4000 mg | INTRAMUSCULAR | Status: DC | PRN
  Filled 2021-06-03: qty 1

## 2021-06-03 MED ORDER — NALOXONE HCL 0.4 MG/ML IJ SOLN
INTRAMUSCULAR | Status: AC
Start: 2021-06-03 — End: 2021-06-03
  Administered 2021-06-03: 0.4 mg
  Filled 2021-06-03: qty 1

## 2021-06-03 MED ORDER — SODIUM CHLORIDE 0.9 % IV SOLN: 100.0000 mL | INTRAVENOUS | Status: DC | PRN

## 2021-06-03 MED ORDER — ALBUMIN HUMAN 25 % IV SOLN: 25.0000 g | Freq: Once | INTRAVENOUS | Status: AC

## 2021-06-03 MED ORDER — LIDOCAINE HCL (PF) 1 % IJ SOLN: 5.0000 mL | INTRAMUSCULAR | Status: DC | PRN

## 2021-06-03 MED ORDER — LIDOCAINE-PRILOCAINE 2.5-2.5 % EX CREA: 1.0000 "application " | TOPICAL_CREAM | CUTANEOUS | Status: DC | PRN

## 2021-06-03 MED ORDER — ALBUMIN HUMAN 25 % IV SOLN
INTRAVENOUS | Status: AC
  Administered 2021-06-03: 25 g via INTRAVENOUS
  Filled 2021-06-03: qty 100

## 2021-06-03 MED ORDER — ALTEPLASE 2 MG IJ SOLR: 2.0000 mg | Freq: Once | INTRAMUSCULAR | Status: DC | PRN

## 2021-06-03 MED ORDER — HEPARIN SODIUM (PORCINE) 1000 UNIT/ML DIALYSIS: 1000.0000 [IU] | INTRAMUSCULAR | Status: DC | PRN

## 2021-06-03 MED ORDER — TRAMADOL HCL 50 MG PO TABS
50.0000 mg | ORAL_TABLET | Freq: Once | ORAL | Status: AC
  Administered 2021-06-03: 50 mg via ORAL
  Filled 2021-06-03: qty 1

## 2021-06-03 MED ORDER — SODIUM CHLORIDE 0.9% IV SOLUTION: Freq: Once | INTRAVENOUS | Status: AC

## 2021-06-03 NOTE — Significant Event (Signed)
Rapid Response Event Note  ? ?Reason for Call :  ?Patient difficult to arouse ? ?Initial Focused Assessment:  ?She is mostly obtunded.  She does react a little to deep painful pressure.   ?Lung sounds decreased bases ?Heart tones regular ?BP 76/57  HR 72  RR 12  O2 sat 92%   ? ? ?Interventions:  ?0.4 mg Narcan IV ?Pt woke up,  is restless but not oriented. ?Dr Florene Glen came to bedside ? ?Plan of Care:  ?RN to call if patient's mental status does not continue to improve. ? ? ?Event Summary:  ? ?MD Notified: Florene Glen ?Call Time: 1515 ?Arrival Time: 1518 ?End Time: 2158 ? ?Raliegh Ip, RN ?

## 2021-06-03 NOTE — Progress Notes (Signed)
removed 38108ms net fluid.  gave 1 unit of blood as ordered.  gave '10mg'$  midorine, 25grams of albumin ,  2 mg morphine and 1.5 tabs oxycodone for pain as ordered.  bp tolerated t dialysis well.  2 bandages to rua avf no bleeding dressing cdi.  pre bp 106/49  post bp 101/44 pre weight 118.0kg post weight 114.5kg.  bed scales. ?

## 2021-06-03 NOTE — Anesthesia Postprocedure Evaluation (Signed)
Anesthesia Post Note ? ?Patient: [name] ? ?Procedure(s) Performed: AMPUTATION BELOW KNEE (Left: Leg Lower) ? ?  ? ?Patient location during evaluation: PACU ?Anesthesia Type: Regional ?Level of consciousness: awake and alert ?Pain management: pain level controlled ?Vital Signs Assessment: post-procedure vital signs reviewed and stable ?Respiratory status: spontaneous breathing, nonlabored ventilation, respiratory function stable and patient connected to nasal cannula oxygen ?Cardiovascular status: stable and blood pressure returned to baseline ?Postop Assessment: no apparent nausea or vomiting ?Anesthetic complications: no ? ? ?No notable events documented. ? ?Last Vitals:  ?Vitals:  ? 06/03/21 0728 06/03/21 0743  ?BP: (!) 99/48 (!) 106/49  ?Pulse: 70 70  ?Resp:    ?Temp: 36.7 ?C   ?SpO2:    ?  ?Last Pain:  ?Vitals:  ? 06/03/21 0733  ?TempSrc:   ?PainSc: 0-No pain  ? ? ?  ?  ?  ?  ?  ?  ? ?Santa Lighter ? ? ? ? ?

## 2021-06-03 NOTE — Progress Notes (Signed)
OT Cancellation Note ? ?Patient Details ?Name: [name] ?MRN: [id number] ?DOB: 1965/06/28 ? ? ?Cancelled Treatment:    Reason Eval/Treat Not Completed: Patient at procedure or test/ unavailable;Medical issues which prohibited therapy (Hgb 6.1 and pt scheduled for blood transfusion, pt off floor in HD). Will assess for OT as schedule allows.) ? ?Percell Miller Beth Dixon, OTR/L ?06/03/2021, 9:00 AM ?

## 2021-06-03 NOTE — Hospital Course (Addendum)
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  She had Choua Ikner chronic wound on her left fifth toe and last 3 days it has been bleeding and draining.  She had x ray on 4/24 with findings with soft tissue swelling and air in the 4th and 5th metatarsals and phalanges worrisome for infection as well as fragmentation secondary to osteomyelitis vs fracture involving the 5th proximal and distal phalanx.  She's s/p aortogram on 4/26 with laser atherectomy of the anterior tibial artery and balloon angioplasty of the anterior tibial artery.  On 4/27, she had amputation of the left 4th and 5th toes including metatarsal bones and sharp excisional debridement L foot of skin and soft tissue measuring 12x7 cm.   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  Hospitalization complicated by enterobacter aerogenes bacteremia, now complete Tressie Ragin course of cefepime.  Hospitalization c/b encephalopathy, daytime sleepiness.  She seems improved with bipap.  Currently awaiting SNF. ? ?See below for additional details   ?

## 2021-06-03 NOTE — Assessment & Plan Note (Addendum)
S/p 5 unit pRBC  ?Follow post transfusion Hb ?Repeat anemia labs - AOCD ?

## 2021-06-03 NOTE — Assessment & Plan Note (Addendum)
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 ?

## 2021-06-03 NOTE — Progress Notes (Addendum)
?PROGRESS NOTE ? ? ? ?[name]  YQM:[id number] DOB: 1965-10-11 DOA: 05/25/2021 ?PCP: Sandi Mariscal, MD  ?Chief Complaint  ?Patient presents with  ? Wound Check  ? ? 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  She had Stacy Sailer chronic wound on her left fifth toe and last 3 days it has been bleeding and draining.  In the emergency room temperature 101.8, tachycardia 140, blood pressure stable.  On 4 L oxygen.  Hemoglobin 7.2.  Lactic acid 2.4.  Left foot x-ray with soft tissue swelling and osteomyelitis fifth phalanx.  Started on antibiotics, orthopedics , vascular and nephrology consulted. ?Fourth and fifth metatarsal amputation 4/27. 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  ? ? ?Assessment & Plan: ?  ?Principal Problem: ?  Osteomyelitis ([place]) ?Active Problems: ?  Bacteremia due to Enterobacter aerogenes ?  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  Anemia in chronic kidney disease ?  Hypotension ?  Hypertension associated with diabetes ([place]) ?  Hyperlipidemia associated with type 2 diabetes mellitus ([place]) ?  Rheumatoid arthritis ([place]) ?  ESRD (end stage renal disease) ([place]) ?  Type 2 diabetes mellitus with diabetic chronic kidney disease ([place]) ?  Diastolic dysfunction ?  Chronic pain disorder ?  Coronary artery disease ?  Obesity (BMI 30-39.9) ?  Sepsis ([place]) ? ?Addendum: hypotension, AMS - improved after narcan. Hb 7.4 post transfusion, will transfusion an additional unit.  Narcan prn.  D/c all opiates.  ? ?Assessment and Plan: ?Bacteremia due to Enterobacter aerogenes ?1/2 sets with enterobacter aerogenes from 4/24 (aerobic bottle only) ?1/2 sets with staph epidermidis (aerobic/anaerobic) - thought contaminant  ?Repeat cx 4/28 and 4/29 no  growth  ?Enterobacter aerogenes resistant to ancef  ?Cefepime 4/28 - present ?Plain films from 4/24 with evidence of soft tissue infection/osteomyelitis  ?Now s/p L BKA  ? ? ? 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 ? ?Anemia in chronic kidney disease ?S/p 4 unit pRBC  ?Follow post transfusion Hb ? ?Hypotension ?Soft BP, follow post transfusion ?Midodrine, albumin during dialysis ?Hold metoprolol  ? ?Hypertension associated with diabetes ([place]) ?Hold metop  ? ?Hyperlipidemia associated with type 2 diabetes mellitus ([place]) ?rosuvastatin ? ?Rheumatoid arthritis ([place]) ?humira on hold ? ?ESRD (end stage renal disease) ([place]) ?Renal consulted ? ? ?Type 2 diabetes mellitus with diabetic chronic kidney disease ([place]) ?SSI, 70/30 ? ? ?DVT prophylaxis: SCD ?Code Status: full ?Family Communication: none ?Disposition:  ? ?Status is: Inpatient ?Remains inpatient appropriate because: need for continued abx, continued vascular eval ?  ?Consultants:  ?Vascular ?renal ? ?Procedures:  ?4/26 ?Procedure Performed: ?1.  Ultrasound-guided micropuncture access of the right common femoral artery ?2.  Aortogram ?3.  Second-order cannulation, left lower extremity angiogram ?4.  Laser atherectomy of the anterior tibial artery ?5.  Balloon angioplasty  of the anterior tibial artery ?6.  Device assisted closure-Mynx ? ?4/27 ?Amputation of the left fourth and fifth toes  including metatarsal bones and sharp excisional debridement left foot of skin and soft tissue measuring 12 x 7 cm ?  ?5/2 ?Left below-knee amputation ? ?Antimicrobials:  ?Anti-infectives (From admission, onward)  ? ? Start     Dose/Rate Route Frequency Ordered Stop  ? 05/29/21 0845  ceFEPIme (MAXIPIME) 2 g in sodium chloride 0.9 % 100 mL IVPB       ? 2 g ?200 mL/hr over 30 Minutes Intravenous  Once 05/29/21 0836 05/29/21 1220  ? 05/27/21 1200  vancomycin (VANCOCIN) IVPB 1000 mg/200 mL premix  Status:  Discontinued       ? 1,000 mg ?200 mL/hr over 60 Minutes Intravenous Every M-W-F (Hemodialysis) 05/27/21 0844 05/31/21 0724  ? 05/26/21 2200  ceFEPIme (MAXIPIME) 1 g in sodium chloride 0.9 % 100 mL IVPB       ? 1 g ?200 mL/hr over 30 Minutes Intravenous Every 24 hours 05/25/21 2255    ? 05/25/21 2200  ceFEPIme (MAXIPIME) 2 g in sodium chloride 0.9 % 100 mL IVPB       ? 2 g ?200 mL/hr over 30 Minutes Intravenous  Once 05/25/21 2123 05/25/21 2229  ? 05/25/21 2100  vancomycin (VANCOREADY) IVPB 2000 mg/400 mL       ? 2,000 mg ?200 mL/hr over 120 Minutes Intravenous  Once 05/25/21 2012 05/26/21 0041  ? ?  ? ? ?Subjective: ?Denies complaints ?No LH, CP, SOB ?Pain to lower extremity ? ?Objective: ?Vitals:  ? 06/03/21 1200 06/03/21 1204 06/03/21 1224 06/03/21 1249  ?BP: 114/70  (!) 78/50 (!) 91/52  ?Pulse: 72  92   ?Resp: 16  12   ?Temp:  97.7 ?F (36.5 ?C) 98.2 ?F (36.8 ?C)   ?TempSrc:  Oral Oral   ?SpO2:   90%   ?Weight: 114.5 kg     ?Height:      ? ? ?Intake/Output Summary (Last 24 hours) at 06/03/2021 1336 ?Last data filed at 06/03/2021 1300 ?Gross per 24 hour  ?Intake 611.73 ml  ?Output 1500 ml  ?Net -888.27 ml  ? ?Filed Weights  ? 06/03/21 0500 06/03/21 0731 06/03/21 1200  ?Weight: 122.4 kg 118 kg 114.5 kg  ? ? ?Examination: ? ?General exam: Appears calm and comfortable, seen on dialysis ?Respiratory system: unlabored ?Cardiovascular system: RRR ?Gastrointestinal system: Abdomen is nondistended, soft and nontender ?Central  nervous system: Alert and oriented. ?Extremities: LLE with dressing in place ? ? ? ?Data Reviewed: I have personally reviewed following labs and imaging studies ? ?CBC: ?Recent Labs  ?Lab 05/29/21 ?4097 05/29/21 ?3532 06/01/21 ?9924 06/02/21 ?1020 06/02/21 ?1243 06/03/21 ?0134 06/03/21 ?1312  ?WBC 15.0* 15.6* 17.8*  --   --  10.6*  --   ?HGB 7.8* 7.6* 6.4* 7.8* 8.2* 6.1* 7.4*  ?HCT 24.2* 23.3* 20.1* 23.0* 24.0* 20.0* 22.6*  ?MCV 94.2 92.8 97.6  --   --  96.2  --   ?PLT 250 237 239  --   --  247  --   ? ? ?Basic Metabolic Panel: ?Recent Labs  ?Lab 05/29/21 ?2683 05/31/21 ?0929 06/01/21 ?4196 06/02/21 ?1020 06/02/21 ?1243 06/03/21 ?0134  ?NA 127* 132* 131* 130* 133* 130*  ?K 5.7* 5.2* 5.3* 4.8 4.5 4.7  ?CL 89* 94* 92* 95* 94* 93*  ?CO2 19* 22 22  --   --  23  ?GLUCOSE 207* 212* 243* 261* 223* 308*  ?BUN 80* 64* 75*  40* 44* 44*  ?CREATININE 8.51* 7.80* 9.08* 5.90* 5.60* 6.49*  ?CALCIUM 8.7* 8.6* 8.6*  --   --  8.3*  ?PHOS 5.0* 4.9* 6.9*  --   --   --   ? ? ?GFR: ?Estimated Creatinine Clearance: 12.9 mL/min ([facility]) (by C-G formula based on SCr of 6.49 mg/dL (H)). ? ?Liver Function Tests: ?Recent Labs  ?Lab 05/29/21 ?8115 05/31/21 ?0929 06/01/21 ?7262  ?ALBUMIN 2.6* 2.5* 2.5*  ? ? ?CBG: ?Recent Labs  ?Lab 06/02/21 ?1407 06/02/21 ?1657 06/02/21 ?2135 06/03/21 ?0355 06/03/21 ?1223  ?GLUCAP 214* 220* 282* 265* 129*  ? ? ? ?Recent Results (from the past 240 hour(s))  ?Blood culture (routine x 2)     Status: Abnormal  ? Collection Time: 05/25/21  9:20 PM  ? Specimen: BLOOD LEFT HAND  ?Result Value Ref Range Status  ? Specimen Description BLOOD LEFT HAND  Final  ? Special Requests   Final  ?  BOTTLES DRAWN AEROBIC AND ANAEROBIC Blood Culture adequate volume  ? Culture  Setup Time   Final  ?  GRAM NEGATIVE RODS ?AEROBIC BOTTLE ONLY ?CRITICAL RESULT CALLED TO, READ BACK BY AND VERIFIED WITH: PHARMD JESSICA MILLEN ON 05/28/21 @ 1900 BY DRT ?Performed at [facility], [place] [address] [place], [place] [address] ?  ? Culture  ENTEROBACTER AEROGENES ([name])  Final  ? Report Status 05/30/2021 FINAL  Final  ? Organism ID, Bacteria ENTEROBACTER AEROGENES  Final  ?    Susceptibility  ? Enterobacter aerogenes - MIC*  ?  CEFAZOLIN >=64 RESISTANT Re

## 2021-06-03 NOTE — Assessment & Plan Note (Signed)
Renal consulted ? ?

## 2021-06-03 NOTE — Assessment & Plan Note (Signed)
rosuvastatin

## 2021-06-03 NOTE — Assessment & Plan Note (Addendum)
Likely due to osteomyelitis to L foot ?Now s/p amputation ?1/2 sets with enterobacter aerogenes from 4/24 (aerobic bottle only) ?1/2 sets with staph epidermidis (aerobic/anaerobic) - thought contaminant  ?Repeat cx 4/28 and 4/29 no growth  ?Recurrent fever 5/3 PM -> follow repeat cultures (no growth), CXR with consolidation in bases edema with patchy lower zonal haziness (ground glass edema and/or pneumonia) -> repeat CXR 5/7 (hazy perihilar and bibasilar opacification, edema vs infection) ?Enterobacter aerogenes resistant to ancef  ?Cefepime 4/28 - 5/7 (will d/c cefepime today with her persistent AMS and myoclonic jerking/asterixis).   9 days abx after source control on 4/27 should be adequate for gram negative bacteremia. 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 ? ?

## 2021-06-03 NOTE — Assessment & Plan Note (Addendum)
Improved (5/5 needed bolus and midodrine), will follow ?Midodrine, albumin during dialysis ?Metoprolol ordered for rate control ?

## 2021-06-03 NOTE — Progress Notes (Signed)
? [facility] ?Progress Note  ? ?Subjective:    ?Had L BKA yesterday. Hgb dropped to 6.1. Getting transfused with dialysis.  ?Seen in HD unit. She's alert, some pain this am.  ? ?Objective ?Vitals:  ? 06/03/21 0816 06/03/21 0830 06/03/21 0840 06/03/21 0856  ?BP: 115/61 107/63 107/63 107/65  ?Pulse: 71 72 72 69  ?Resp:      ?Temp: 97.8 ?F (36.6 ?C)  97.7 ?F (36.5 ?C) 97.7 ?F (36.5 ?C)  ?TempSrc: Oral  Oral Oral  ?SpO2:      ?Weight:      ?Height:      ? ?Physical Exam ?General: chronically ill appearing, sleepy female in NAD ?Heart: RRR, no mrg ?Lungs: CTAB, nml WOB ?Abdomen: soft, NTND ?Extremities: trace LE edema, woody appearance. L BKA, stump wrapped.  ?Dialysis Access: RU AVF +b/t  ? ?Filed Weights  ? 06/01/21 1157 06/03/21 0500 06/03/21 0731  ?Weight: 117.3 kg 122.4 kg 118 kg  ? ? ?Intake/Output Summary (Last 24 hours) at 06/03/2021 0901 ?Last data filed at 06/03/2021 0215 ?Gross per 24 hour  ?Intake 1095.06 ml  ?Output 250 ml  ?Net 845.06 ml  ? ? ? ?Additional Objective ?Labs: ?Basic Metabolic Panel: ?Recent Labs  ?Lab 05/29/21 ?9326 05/31/21 ?0929 06/01/21 ?7124 06/02/21 ?1020 06/02/21 ?1243 06/03/21 ?0134  ?NA 127* 132* 131* 130* 133* 130*  ?K 5.7* 5.2* 5.3* 4.8 4.5 4.7  ?CL 89* 94* 92* 95* 94* 93*  ?CO2 19* 22 22  --   --  23  ?GLUCOSE 207* 212* 243* 261* 223* 308*  ?BUN 80* 64* 75* 40* 44* 44*  ?CREATININE 8.51* 7.80* 9.08* 5.90* 5.60* 6.49*  ?CALCIUM 8.7* 8.6* 8.6*  --   --  8.3*  ?PHOS 5.0* 4.9* 6.9*  --   --   --   ? ? ?Liver Function Tests: ?Recent Labs  ?Lab 05/29/21 ?5809 05/31/21 ?0929 06/01/21 ?9833  ?ALBUMIN 2.6* 2.5* 2.5*  ? ? ?No results for input(s): LIPASE, AMYLASE in the last 168 hours. ?CBC: ?Recent Labs  ?Lab 05/29/21 ?8250 05/29/21 ?5397 06/01/21 ?6734 06/02/21 ?1020 06/02/21 ?1243 06/03/21 ?0134  ?WBC 15.0* 15.6* 17.8*  --   --  10.6*  ?HGB 7.8* 7.6* 6.4* 7.8* 8.2* 6.1*  ?HCT 24.2* 23.3* 20.1* 23.0* 24.0* 20.0*  ?MCV 94.2 92.8 97.6  --   --  96.2  ?PLT 250 237 239  --   --  247   ? ? ?Blood Culture ?   ?Component Value Date/Time  ? SDES BLOOD LEFT HAND 05/30/2021 0136  ? SPECREQUEST  05/30/2021 0136  ?  BOTTLES DRAWN AEROBIC AND ANAEROBIC Blood Culture adequate volume  ? CULT  05/30/2021 0136  ?  NO GROWTH 4 DAYS ?Performed at [facility], [place] [address], [place], [place] [address] ?  ? REPTSTATUS PENDING 05/30/2021 0136  ? ? ?Cardiac Enzymes: ?No results for input(s): CKTOTAL, CKMB, CKMBINDEX, TROPONINI in the last 168 hours. ?CBG: ?Recent Labs  ?Lab 06/02/21 ?1217 06/02/21 ?1407 06/02/21 ?1657 06/02/21 ?2135 06/03/21 ?0240  ?GLUCAP 222* 214* 220* 282* 265*  ? ? ?Iron Studies: No results for input(s): IRON, TIBC, TRANSFERRIN, FERRITIN in the last 72 hours. ?Lab Results  ?Component Value Date  ? INR 1.0 11/14/2018  ? INR 0.90 01/30/2011  ? ?Studies/Results: ?No results found. ? ?Medications: ? sodium chloride    ? ceFEPime (MAXIPIME) IV 1 g (06/02/21 0859)  ? magnesium sulfate bolus IVPB    ? ? sodium chloride   Intravenous Once  ? sodium  chloride   Intravenous Once  ? Chlorhexidine Gluconate Cloth  6 each Topical Q0600  ? clopidogrel  75 mg Oral Q breakfast  ? docusate sodium  100 mg Oral Daily  ? heparin  5,000 Units Subcutaneous Q8H  ? insulin aspart  0-5 Units Subcutaneous QHS  ? insulin aspart  0-6 Units Subcutaneous TID WC  ? insulin aspart protamine- aspart  35 Units Subcutaneous BID WC  ? metoprolol succinate  50 mg Oral QHS  ? pantoprazole  40 mg Oral Daily  ? rosuvastatin  40 mg Oral Daily  ? sodium chloride flush  3 mL Intravenous Q12H  ? sodium chloride flush  3 mL Intravenous Q12H  ? ? ?Dialysis Orders: ?MWF  - GOC ?4hrs, BFR 450, DFR 800,  EDW 110.6kg, 2K/ 2.5Ca, RU AVF,  ?-Heparin 10000 unit IV bolus TIW ? - No ESA 2/2 ?malignancy (no diagnosis - has stable renal lesion, pulm lesions -> saw onc 4/17, plan is for bronchoscopy with Bx of lung nodules in near future) ?- Calcitriol 2.5 mcg PO qHD ?- Parsabiv 7.5 mg IV qHD  ?  ?Assessment/Plan: ?Gangrenous LLE 5th  digit/Osteomyelitis/chronic wounds on BLE in setting of PVD - ortho and vascular consulting.  S/p balloon angioplasty and laser atherectomy to LATA 4/26, then amputation of L 4th/5th metarsals 05/28/2021 Dr. Donzetta Matters. 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  ?Volume overload:  Continue lowering volume as tolerated with HD.  ?ESRD: Continue HD per usual MWF schedule. HD today  ?Hypertension: BP variable. Metoprolol decreased to 50 mg qhs. Hypotensive with HD, can utilize midodrine and albumin with HD for BP support.  ?Anemia of ESRD: Hgb 7 on admit, s/p 1U PRBCs 4/25, 2 units pRBC 5/1. Hgb down to 6.1 post-op. Transfusing another unit 5/3. Follow trends.   Not getting outpatient ESA d/t potential malignancy.   ?Secondary Hyperparathyroidism: Ca/Phos ok. Continue VDRA on MWF.  Unable to give parsabiv not on formulary.  Continue binders. ?Nutrition - Renal diet w/fluid restrictions.  ?DMT2 - per PMD ? ? ?Lynnda Child PA-C ?[facility] ?06/03/2021,9:01 AM ? ? ?

## 2021-06-03 NOTE — Assessment & Plan Note (Addendum)
Metop with holding parameters ?

## 2021-06-03 NOTE — Assessment & Plan Note (Signed)
SSI, 70/30 ?

## 2021-06-03 NOTE — Plan of Care (Signed)
  Problem: Clinical Measurements: Goal: Will remain free from infection Outcome: Progressing   Problem: Health Behavior/Discharge Planning: Goal: Ability to manage health-related needs will improve Outcome: Not Progressing   

## 2021-06-03 NOTE — Progress Notes (Addendum)
?  Progress Note ? ? ? ?06/03/2021 ?8:41 AM ?1 Day Post-Op ? ?Subjective:  no complaints.  Asleep on HD ? ? ?Vitals:  ? 06/03/21 0830 06/03/21 0840  ?BP: 107/63 107/63  ?Pulse: 72 72  ?Resp:    ?Temp:  97.7 ?F (36.5 ?C)  ?SpO2:    ? ?Physical Exam: ?Lungs:  non labored ?Incisions:  L BKA with dressing in place ?Neurologic: A&O ? ?CBC ?   ?Component Value Date/Time  ? WBC 10.6 (H) 06/03/2021 0134  ? RBC 2.08 (L) 06/03/2021 0134  ? HGB 6.1 (LL) 06/03/2021 0134  ? HGB 9.8 (L) 03/27/2021 1113  ? HGB 8.1 (L) 09/18/2018 1227  ? HCT 20.0 (L) 06/03/2021 0134  ? HCT 39.5 06/01/2017 0959  ? PLT 247 06/03/2021 0134  ? PLT 228 03/27/2021 1113  ? PLT 238 06/01/2017 0959  ? MCV 96.2 06/03/2021 0134  ? MCV 90 06/01/2017 0959  ? MCH 29.3 06/03/2021 0134  ? MCHC 30.5 06/03/2021 0134  ? RDW 16.9 (H) 06/03/2021 0134  ? RDW 18.4 (H) 06/01/2017 0959  ? LYMPHSABS 1.2 04/26/2021 1138  ? MONOABS 0.5 04/26/2021 1138  ? EOSABS 0.3 04/26/2021 1138  ? BASOSABS 0.0 04/26/2021 1138  ? ? ?BMET ?   ?Component Value Date/Time  ? NA 130 (L) 06/03/2021 0134  ? NA 135 06/01/2017 0959  ? K 4.7 06/03/2021 0134  ? CL 93 (L) 06/03/2021 0134  ? CO2 23 06/03/2021 0134  ? GLUCOSE 308 (H) 06/03/2021 0134  ? BUN 44 (H) 06/03/2021 0134  ? BUN 27 (H) 06/01/2017 0959  ? CREATININE 6.49 (H) 06/03/2021 0134  ? CREATININE 2.96 (H) 03/27/2021 1113  ? CREATININE 1.96 (H) 07/19/2011 1201  ? CALCIUM 8.3 (L) 06/03/2021 0134  ? GFRNONAA 7 (L) 06/03/2021 0134  ? GFRNONAA 18 (L) 03/27/2021 1113  ? GFRAA 7 (L) 07/16/2019 1152  ? ? ?INR ?   ?Component Value Date/Time  ? INR 1.0 11/14/2018 1424  ? ? ? ?Intake/Output Summary (Last 24 hours) at 06/03/2021 0841 ?Last data filed at 06/03/2021 0215 ?Gross per 24 hour  ?Intake 1095.06 ml  ?Output 250 ml  ?Net 845.06 ml  ? ? ? ?Assessment/Plan:  56 y.o. female is s/p L BKA 1 Day Post-Op  ? ?No breakthrough bleeding from L BKA ?Pain medications on schedule today ?Plans noted for transfusion during HD treatment today ?Dressing change Friday  or Monday ? ? ?Dagoberto Ligas, PA-C ?[facility] ?[phone] ?06/03/2021 ?8:41 AM ? ? ? ?

## 2021-06-03 NOTE — Progress Notes (Signed)
Notified Dr. Bridgett Larsson, about patient's hgb 6.1. Dr. Bridgett Larsson stated, "to defer to rounding and nephrology. ?

## 2021-06-03 NOTE — Assessment & Plan Note (Signed)
humira on hold ?

## 2021-06-03 NOTE — Progress Notes (Signed)
Patient returned from HD extremely somnolent.  Patient is arousable to voice, however, cannot stay awake and is not felt safe to swallow pills at this time.  Vital signs are stable. MD is aware and BP's are being cycled as well as repeat labs drawn.  Will continue to monitor closely. ?

## 2021-06-03 NOTE — Progress Notes (Signed)
Patient became increasingly somnolent and unarousable.  BP 70's/50's; O2 sat 90-95 on 3L [place].  RR called to bedside.  Narcan administered slow push.  Patient waking slowly and BP's improving, 105/78.  MD made aware.  Will continue to monitor. ?

## 2021-06-03 NOTE — Progress Notes (Addendum)
PT Cancellation Note ? ?Patient Details ?Name: [name] ?MRN: [id number] ?DOB: 23-Aug-1965 ? ? ?Cancelled Treatment:    Reason Eval/Treat Not Completed: Medical issues which prohibited therapy (Hgb 6.1 and pt has not yet received blood transfusion) ? ?Addendum 15:53: Attempted to see patient again. Pt with recent somnolence and received Narcan. RN at bedside ? ?[name], PT, DPT ?[facility] ?Pager [phone] ?Office [phone] ? ? ? ?Deno Etienne ?06/03/2021, 8:01 AM ?

## 2021-06-04 ENCOUNTER — Ambulatory Visit: Payer: Medicare Other | Admitting: Emergency Medicine

## 2021-06-04 ENCOUNTER — Inpatient Hospital Stay (HOSPITAL_COMMUNITY): Payer: Medicare Other

## 2021-06-04 DIAGNOSIS — G9341 Metabolic encephalopathy: Secondary | ICD-10-CM

## 2021-06-04 DIAGNOSIS — R278 Other lack of coordination: Secondary | ICD-10-CM

## 2021-06-04 DIAGNOSIS — M86272 Subacute osteomyelitis, left ankle and foot: Secondary | ICD-10-CM | POA: Diagnosis not present

## 2021-06-04 LAB — TYPE AND SCREEN
ABO/RH(D): A POS
Antibody Screen: NEGATIVE
Unit division: 0
Unit division: 0
Unit division: 0
Unit division: 0

## 2021-06-04 LAB — BPAM RBC
Blood Product Expiration Date: 202305172359
Blood Product Expiration Date: 202305172359
Blood Product Expiration Date: 202305182359
Blood Product Expiration Date: 202305242359
ISSUE DATE / TIME: 202305010951
ISSUE DATE / TIME: 202305010951
ISSUE DATE / TIME: 202305030811
ISSUE DATE / TIME: 202305031611
Unit Type and Rh: 6200
Unit Type and Rh: 6200
Unit Type and Rh: 6200
Unit Type and Rh: 6200

## 2021-06-04 LAB — COMPREHENSIVE METABOLIC PANEL
ALT: 43 U/L (ref 0–44)
AST: 73 U/L — ABNORMAL HIGH (ref 15–41)
Albumin: 2.7 g/dL — ABNORMAL LOW (ref 3.5–5.0)
Alkaline Phosphatase: 171 U/L — ABNORMAL HIGH (ref 38–126)
Anion gap: 15 (ref 5–15)
BUN: 25 mg/dL — ABNORMAL HIGH (ref 6–20)
CO2: 24 mmol/L (ref 22–32)
Calcium: 8.4 mg/dL — ABNORMAL LOW (ref 8.9–10.3)
Chloride: 96 mmol/L — ABNORMAL LOW (ref 98–111)
Creatinine, Ser: 4.74 mg/dL — ABNORMAL HIGH (ref 0.44–1.00)
GFR, Estimated: 10 mL/min — ABNORMAL LOW (ref >60)
Glucose, Bld: 147 mg/dL — ABNORMAL HIGH (ref 70–99)
Potassium: 4.3 mmol/L (ref 3.5–5.1)
Sodium: 135 mmol/L (ref 135–145)
Total Bilirubin: 0.8 mg/dL (ref 0.3–1.2)
Total Protein: 7.6 g/dL (ref 6.5–8.1)

## 2021-06-04 LAB — CBC WITH DIFFERENTIAL/PLATELET
Abs Immature Granulocytes: 0.22 10*3/uL — ABNORMAL HIGH (ref 0.00–0.07)
Basophils Absolute: 0.1 10*3/uL (ref 0.0–0.1)
Basophils Relative: 0 %
Eosinophils Absolute: 0.2 10*3/uL (ref 0.0–0.5)
Eosinophils Relative: 2 %
HCT: 23.6 % — ABNORMAL LOW (ref 36.0–46.0)
Hemoglobin: 7.6 g/dL — ABNORMAL LOW (ref 12.0–15.0)
Immature Granulocytes: 2 %
Lymphocytes Relative: 8 %
Lymphs Abs: 1.1 10*3/uL (ref 0.7–4.0)
MCH: 30.5 pg (ref 26.0–34.0)
MCHC: 32.2 g/dL (ref 30.0–36.0)
MCV: 94.8 fL (ref 80.0–100.0)
Monocytes Absolute: 1 10*3/uL (ref 0.1–1.0)
Monocytes Relative: 8 %
Neutro Abs: 10.3 10*3/uL — ABNORMAL HIGH (ref 1.7–7.7)
Neutrophils Relative %: 80 %
Platelets: 238 10*3/uL (ref 150–400)
RBC: 2.49 MIL/uL — ABNORMAL LOW (ref 3.87–5.11)
RDW: 16.2 % — ABNORMAL HIGH (ref 11.5–15.5)
WBC: 12.9 10*3/uL — ABNORMAL HIGH (ref 4.0–10.5)
nRBC: 0.4 % — ABNORMAL HIGH (ref 0.0–0.2)

## 2021-06-04 LAB — AMMONIA: Ammonia: 27 umol/L (ref 9–35)

## 2021-06-04 LAB — HEMOGLOBIN AND HEMATOCRIT, BLOOD
HCT: 25.7 % — ABNORMAL LOW (ref 36.0–46.0)
Hemoglobin: 8 g/dL — ABNORMAL LOW (ref 12.0–15.0)

## 2021-06-04 LAB — GLUCOSE, CAPILLARY
Glucose-Capillary: 141 mg/dL — ABNORMAL HIGH (ref 70–99)
Glucose-Capillary: 173 mg/dL — ABNORMAL HIGH (ref 70–99)
Glucose-Capillary: 157 mg/dL — ABNORMAL HIGH (ref 70–99)
Glucose-Capillary: 162 mg/dL — ABNORMAL HIGH (ref 70–99)

## 2021-06-04 LAB — CULTURE, BLOOD (ROUTINE X 2)
Culture: NO GROWTH
Culture: NO GROWTH
Special Requests: ADEQUATE
Special Requests: ADEQUATE

## 2021-06-04 LAB — MAGNESIUM: Magnesium: 1.9 mg/dL (ref 1.7–2.4)

## 2021-06-04 LAB — PHOSPHORUS: Phosphorus: 4.7 mg/dL — ABNORMAL HIGH (ref 2.5–4.6)

## 2021-06-04 MED ORDER — PROSOURCE PLUS PO LIQD
30.0000 mL | Freq: Two times a day (BID) | ORAL | Status: DC
  Administered 2021-06-04 – 2021-06-10 (×11): 30 mL via ORAL
  Filled 2021-06-04 (×6): qty 30

## 2021-06-04 NOTE — Progress Notes (Signed)
?PROGRESS NOTE ? ? ? ?[name]  HQI:[id number] DOB: 1965/06/11 DOA: 05/25/2021 ?PCP: Sandi Mariscal, MD  ?Chief Complaint  ?Patient presents with  ? Wound Check  ? ? 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  She had Nell Schrack chronic wound on her left fifth toe and last 3 days it has been bleeding and draining.  In the emergency room temperature 101.8, tachycardia 140, blood pressure stable.  On 4 L oxygen.  Hemoglobin 7.2.  Lactic acid 2.4.  Left foot x-ray with soft tissue swelling and osteomyelitis fifth phalanx.  Started on antibiotics, orthopedics , vascular and nephrology consulted. ?Fourth and fifth metatarsal amputation 4/27. 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  ? ? ?Assessment & Plan: ?  ?Principal Problem: ?  Osteomyelitis ([place]) ?Active Problems: ?  Bacteremia due to Enterobacter aerogenes ?  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  Anemia in chronic kidney disease ?  Asterixis ?  Acute metabolic encephalopathy ?  Hypotension ?  Hypertension associated with diabetes ([place]) ?  Hyperlipidemia associated with type 2 diabetes mellitus ([place]) ?  Rheumatoid arthritis ([place]) ?  ESRD (end stage renal disease) ([place]) ?  Type 2 diabetes mellitus with diabetic chronic kidney disease ([place]) ?  Diastolic dysfunction ?  Chronic pain disorder ?  Coronary artery disease ?  Obesity (BMI 30-39.9) ?  Sepsis ([place]) ? ?Addendum: hypotension, AMS - improved after narcan. Hb 7.4 post transfusion, will transfusion an additional unit.  Narcan prn.  D/c all opiates.  ? ?Assessment and Plan: ?Bacteremia due to Enterobacter aerogenes ?Likely due to osteomyelitis to L foot ?Now s/p amputation ?1/2 sets with enterobacter aerogenes from 4/24 (aerobic bottle  only) ?1/2 sets with staph epidermidis (aerobic/anaerobic) - thought contaminant  ?Repeat cx 4/28 and 4/29 no growth  ?Recurrent fever 5/3 PM -> follow repeat cultures ?Enterobacter aerogenes resistant to ancef  ?Cefepime 4/28 - present ?Plain films from 4/24 with evidence of soft tissue infection/osteomyelitis  ?S/p amputation L 4th and 5th toes on 4/27 ?Now s/p L BKA on 5/2  ? ? ? 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 ? ?Anemia in chronic kidney disease ?S/p 5 unit pRBC  ?Follow post transfusion Hb ? ?Acute metabolic encephalopathy ?Related to opiates, s/p narcan ?Opiates d/c'd ?She's improved today, still some myoclonic jerking and asterixis, continue to hold opiates ? ?Asterixis ?Suspect related to opiates in renal dz  ?Continue to hold ? ?Hypotension ?Related to anemia and opiates ?Midodrine, albumin during dialysis ?Hold metoprolol  ?Follow, improved today ? ?Hypertension associated with diabetes ([place]) ?Hold metop  ? ?Hyperlipidemia associated with type 2 diabetes mellitus ([place]) ?rosuvastatin ? ?Rheumatoid arthritis ([place]) ?humira on hold ? ?ESRD (end stage renal disease) ([place]) ?Renal consulted ? ? ?Type 2 diabetes mellitus with diabetic chronic kidney disease ([place]) ?SSI, 70/30 ? ? ?DVT prophylaxis: SCD ?Code Status: full ?  Family Communication: none ?Disposition:  ? ?Status is: Inpatient ?Remains inpatient appropriate because: need for continued abx, continued vascular eval ?   ?Consultants:  ?Vascular ?renal ? ?Procedures:  ?4/26 ?Procedure Performed: ?1.  Ultrasound-guided micropuncture access of the right common femoral artery ?2.  Aortogram ?3.  Second-order cannulation, left lower extremity angiogram ?4.  Laser atherectomy of the anterior tibial artery ?5.  Balloon angioplasty of the anterior tibial artery ?6.  Device assisted closure-Mynx ? ?4/27 ?Amputation of the left fourth and fifth toes including metatarsal bones and sharp excisional debridement left foot of skin and soft tissue measuring 12 x 7 cm ?  ?5/2 ?Left below-knee amputation ? ?Antimicrobials:  ?Anti-infectives (From admission, onward)  ? ? Start     Dose/Rate Route Frequency Ordered Stop  ? 05/29/21 0845  ceFEPIme (MAXIPIME) 2 g in sodium chloride 0.9 % 100 mL IVPB       ? 2 g ?200 mL/hr over 30 Minutes Intravenous  Once 05/29/21 0836 05/29/21 1220  ? 05/27/21 1200  vancomycin (VANCOCIN) IVPB 1000 mg/200 mL premix  Status:  Discontinued       ? 1,000 mg ?200 mL/hr over 60 Minutes Intravenous Every M-W-F (Hemodialysis) 05/27/21 0844 05/31/21 0724  ? 05/26/21 2200  ceFEPIme (MAXIPIME) 1 g in sodium chloride 0.9 % 100 mL IVPB       ? 1 g ?200 mL/hr over 30 Minutes Intravenous Every 24 hours 05/25/21 2255    ? 05/25/21 2200  ceFEPIme (MAXIPIME) 2 g in sodium chloride 0.9 % 100 mL IVPB       ? 2 g ?200 mL/hr over 30 Minutes Intravenous  Once 05/25/21 2123 05/25/21 2229  ? 05/25/21 2100  vancomycin (VANCOREADY) IVPB 2000 mg/400 mL       ? 2,000 mg ?200 mL/hr over 120 Minutes Intravenous  Once 05/25/21 2012 05/26/21 0041  ? ?  ? ? ?Subjective: ?Denies any new complaint ?Pain ok ? ?Objective: ?Vitals:  ? 06/04/21 0343 06/04/21 0346 06/04/21 0747 06/04/21 1546  ?BP: 111/60  113/75 (!) 121/58  ?Pulse: 80  83 83  ?Resp: '19  16 20  '$ ?Temp: 98.5 ?F (36.9 ?C)  98.4 ?F (36.9 ?C) 98 ?F (36.7 ?C)  ?TempSrc: Oral  Oral   ?SpO2: 92%  91% 97%  ?Weight:  112.4 kg    ?Height:      ? ? ?Intake/Output Summary (Last 24 hours) at 06/04/2021  1735 ?Last data filed at 06/04/2021 0208 ?Gross per 24 hour  ?Intake 450 ml  ?Output --  ?Net 450 ml  ? ?Filed Weights  ? 06/03/21 0731 06/03/21 1200 06/04/21 0346  ?Weight: 118 kg 114.5 kg 112.4 kg  ? ? ?Examination: ? ?General: No acute distress. ?Cardiovascular: RRR ?Lungs: unlabored ?Abdomen: Soft, nontender, nondistended  ?Neurological: Alert and oriented ?3. Moves all extremities ?4 . Cranial nerves II through XII grossly intact. Myoclonic jerks, asterixis ?Skin: Warm and dry. No rashes or lesions. ?Extremities: LLE with dressing intact ? ? ?Data Reviewed: I have personally reviewed following labs and imaging studies ? ?CBC: ?Recent Labs  ?Lab 05/29/21 ?6789 05/29/21 ?3810 06/01/21 ?1751 06/02/21 ?1020 06/02/21 ?1243 06/03/21 ?0134 06/03/21 ?1312 06/04/21 ?0203 06/04/21 ?1437  ?WBC 15.0* 15.6* 17.8*  --   --  10.6*  --  12.9*  --   ?NEUTROABS  --   --   --   --   --   --   --  10.3*  --   ?HGB 7.8* 7.6* 6.4*   < > 8.2*  6.1* 7.4* 7.6* 8.0*  ?HCT 24.2* 23.3* 20.1*   < > 24.0* 20.0* 22.6* 23.6* 25.7*  ?MCV 94.2 92.8 97.6  --   --  96.2  --  94.8  --   ?PLT 250 237 239  --   --  247  --  238  --   ? < > = values in this interval not displayed.  ? ? ?Basic Metabolic Panel: ?Recent Labs  ?Lab 05/29/21 ?8182 05/31/21 ?0929 06/01/21 ?9937 06/02/21 ?1020 06/02/21 ?1243 06/03/21 ?0134 06/04/21 ?0203  ?NA 127* 132* 131* 130* 133* 130* 135  ?K 5.7* 5.2* 5.3* 4.8 4.5 4.7 4.3  ?CL 89* 94* 92* 95* 94* 93* 96*  ?CO2 19* 22 22  --   --  23 24  ?GLUCOSE 207* 212* 243* 261* 223* 308* 147*  ?BUN 80* 64* 75* 40* 44* 44* 25*  ?CREATININE 8.51* 7.80* 9.08* 5.90* 5.60* 6.49* 4.74*  ?CALCIUM 8.7* 8.6* 8.6*  --   --  8.3* 8.4*  ?MG  --   --   --   --   --   --  1.9  ?PHOS 5.0* 4.9* 6.9*  --   --   --  4.7*  ? ? ?GFR: ?Estimated Creatinine Clearance: 17.4 mL/min (Iram Lundberg) (by C-G formula based on SCr of 4.74 mg/dL (H)). ? ?Liver Function Tests: ?Recent Labs  ?Lab 05/29/21 ?1696 05/31/21 ?0929 06/01/21 ?7893 06/04/21 ?0203  ?AST  --   --   --   73*  ?ALT  --   --   --  43  ?ALKPHOS  --   --   --  171*  ?BILITOT  --   --   --  0.8  ?PROT  --   --   --  7.6  ?ALBUMIN 2.6* 2.5* 2.5* 2.7*  ? ? ?CBG: ?Recent Labs  ?Lab 06/03/21 ?1557 06/03/21 ?2139 06/04/21 ?8101 05/0

## 2021-06-04 NOTE — Evaluation (Signed)
Occupational Therapy Evaluation ?Patient Details ?Name: [name] ?MRN: [id number] ?DOB: 1965-09-15 ?Today's Date: 06/04/2021 ? ? ?History of Present Illness 56 y.o. female with medical history significant of hypertension, diabetes, hyperlipidemia, CHF, ESRD on HD, chronic pain, CAD, anemia, Buerger disease, rheumatoid arthritis presenting with infected left fifth toe.  S/p L BKA.  ? ?Clinical Impression ?  ?Patient admitted for the diagnosis and procedure above.  PTA she lives at home with her son and spouse.  Deficits impacting independence are listed below.  Currently she is needing up to Max A for lower body ADL at bedlevel, and up to Mod A for scooting towards [place].  Patient unable to perform sit to stand this date.  OT will follow patient in the acute setting, but SNF is recommended for post acute rehab prior to returning home.  AIR has already signed off at this point.      ?   ? ?Recommendations for follow up therapy are one component of a multi-disciplinary discharge planning process, led by the attending physician.  Recommendations may be updated based on patient status, additional functional criteria and insurance authorization.  ? ?Follow Up Recommendations ? Skilled nursing-short term rehab (<3 hours/day)  ?  ?Assistance Recommended at Discharge Frequent or constant Supervision/Assistance  ?Patient can return home with the following A lot of help with bathing/dressing/bathroom;Two people to help with bathing/dressing/bathroom;Assistance with cooking/housework;Help with stairs or ramp for entrance;Assist for transportation ? ?  ?Functional Status Assessment ? Patient has had a recent decline in their functional status and demonstrates the ability to make significant improvements in function in a reasonable and predictable amount of time.  ?Equipment Recommendations ? None recommended by OT  ?  ?Recommendations for Other Services   ? ? ?  ?Precautions / Restrictions Precautions ?Precautions:  Fall ?Restrictions ?Weight Bearing Restrictions: Yes ?LLE Weight Bearing: Non weight bearing  ? ?  ? ?Mobility Bed Mobility ?Overal bed mobility: Needs Assistance ?Bed Mobility: Supine to Sit, Sit to Supine ?  ?  ?Supine to sit: Min guard ?Sit to supine: Min guard ?  ?  ?Patient Response: Cooperative ? ?Transfers ?Overall transfer level: Needs assistance ?  ?  ?  ?  ?  ?  ?  ?  ?General transfer comment: lateral scoot to [facility] with Mod A ?  ? ?  ?Balance Overall balance assessment: Needs assistance ?Sitting-balance support: Feet supported, Bilateral upper extremity supported ?Sitting balance-Leahy Scale: Fair ?  ?Postural control: Posterior lean ?  ?  ?  ?  ?  ?  ?  ?  ?  ?  ?  ?  ?  ?  ?   ? ?ADL either performed or assessed with clinical judgement  ? ?ADL Overall ADL's : Needs assistance/impaired ?Eating/Feeding: Supervision/ safety;Sitting ?Eating/Feeding Details (indicate cue type and reason): EOB ?Grooming: Wash/dry hands;Wash/dry face;Supervision/safety;Sitting ?Grooming Details (indicate cue type and reason): EOB ?  ?  ?  ?  ?Upper Body Dressing : Minimal assistance;Sitting ?  ?Lower Body Dressing: Maximal assistance;Bed level ?  ?  ?  ?  ?  ?  ?  ?  ?   ? ? ? ?Vision Patient Visual Report: No change from baseline ?   ?   ?Perception Perception ?Perception: Not tested ?  ?Praxis Praxis ?Praxis: Not tested ?  ? ?Pertinent Vitals/Pain Pain Assessment ?Pain Assessment: Faces ?Faces Pain Scale: Hurts even more ?Pain Location: L leg ?Pain Descriptors / Indicators: Sharp, Guarding, Grimacing ?Pain Intervention(s): Monitored during session  ? ? ? ?  Hand Dominance Right ?  ?Extremity/Trunk Assessment Upper Extremity Assessment ?Upper Extremity Assessment: Generalized weakness ?  ?Lower Extremity Assessment ?Lower Extremity Assessment: Defer to PT evaluation ?  ?Cervical / Trunk Assessment ?Cervical / Trunk Assessment: Kyphotic ?  ?Communication Communication ?Communication: No difficulties ?  ?Cognition  Arousal/Alertness: Lethargic, Suspect due to medications ?Behavior During Therapy: Flat affect ?Overall Cognitive Status: Within Functional Limits for tasks assessed ?  ?  ?  ?  ?  ?  ?  ?  ?  ?  ?  ?  ?  ?  ?  ?  ?General Comments: decreased safety ?  ?  ?General Comments   VVS on O2 ? ?  ?Exercises   ?  ?Shoulder Instructions    ? ? ?Home Living Family/patient expects to be discharged to:: Private residence ?Living Arrangements: Spouse/significant other;Children ?Available Help at Discharge: Family ?Type of Home: House ?Home Access: Stairs to enter ?Entrance Stairs-Number of Steps: 1 ?  ?Home Layout: One level ?  ?  ?Bathroom Shower/Tub: Walk-in shower ?  ?Bathroom Toilet: Handicapped height ?Bathroom Accessibility: Yes ?How Accessible: Accessible via walker ?Home Equipment: Conservation officer, nature (2 wheels);Rollator (4 wheels);Shower seat;Grab bars - tub/shower;Wheelchair - manual;Hand held shower head ?  ?Additional Comments: pt endorses using RW/Rollator for in home mobility, w/c stays in car for community mobility. son works 3rd shift husband works 1st shift but she always has someone home with her ?  ? ?  ?Prior Functioning/Environment   ?  ?  ?  ?  ?  ?  ?  ?ADLs Comments: husband and son have had to assist wtih dressing since thumb amputation for pulling pants/socks up and buttoning/zipping. but endorses indep with bathing. ?  ? ?  ?  ?OT Problem List: Decreased strength;Decreased activity tolerance;Impaired balance (sitting and/or standing);Decreased safety awareness;Pain;Impaired sensation ?  ?   ?OT Treatment/Interventions: Self-care/ADL training;Therapeutic activities;DME and/or AE instruction;Balance training;Patient/family education  ?  ?OT Goals(Current goals can be found in the care plan section) Acute Rehab OT Goals ?Patient Stated Goal: Hoping to get better and go home ?OT Goal Formulation: With patient ?Time For Goal Achievement: 06/18/21 ?Potential to Achieve Goals: Good ?ADL Goals ?Pt Will Perform  Grooming: with modified independence;sitting ?Pt Will Perform Upper Body Dressing: with modified independence;sitting ?Pt Will Perform Lower Body Dressing: with min assist;sitting/lateral leans ?Pt Will Transfer to Toilet: stand pivot transfer;bedside commode;with mod assist  ?OT Frequency: Min 2X/week ?  ? ?Co-evaluation   ?  ?  ?  ?  ? ?  ?AM-PAC OT "6 Clicks" Daily Activity     ?Outcome Measure Help from another person eating meals?: None ?Help from another person taking care of personal grooming?: A Little ?Help from another person toileting, which includes using toliet, bedpan, or urinal?: A Lot ?Help from another person bathing (including washing, rinsing, drying)?: A Lot ?Help from another person to put on and taking off regular upper body clothing?: A Lot ?Help from another person to put on and taking off regular lower body clothing?: A Lot ?6 Click Score: 15 ?  ?End of Session Equipment Utilized During Treatment: Oxygen ?Nurse Communication: Mobility status ? ?Activity Tolerance: Patient limited by lethargy;Patient limited by pain ?Patient left: in bed;with call bell/phone within reach;with bed alarm set ? ?OT Visit Diagnosis: Unsteadiness on feet (R26.81);Muscle weakness (generalized) (M62.81);Pain ?Pain - Right/Left: Left ?Pain - part of body: Leg  ?              ?Time: 1442-1500 ?OT Time Calculation (  min): 18 min ?Charges:  OT General Charges ?$OT Visit: 1 Visit ?OT Evaluation ?$OT Eval Moderate Complexity: 1 Mod ? ?06/04/2021 ? ?RP, OTR/L ? ?Acute Rehabilitation Services ? ?Office:  [phone] ? ? ?Sheresa Cullop D Charnele Semple ?06/04/2021, 3:41 PM ?

## 2021-06-04 NOTE — Assessment & Plan Note (Addendum)
Related to opiates, s/p narcan ?Opiates d/c'd ?Still very sleepy, falls asleep during conversation - sister notes this is not much different than home.  ? OSA -> trial of bipap (mental status is improved today) ?Ammonia wnl, VBG without hypercarbia  ?She's got some asterixis, myoclonic jerking -> this is improved. ?She's on cefepime, possible contributor? Stop this, follow EEG -> moderate diffuse encephalopathy. ? ?

## 2021-06-04 NOTE — Progress Notes (Signed)
Pharmacy Antibiotic Note ? ?[name] is a 56 y.o. female admitted on 05/25/2021 with  wound infection/bacteremia .  Pharmacy has been consulted for cefepime  dosing. She is noted s/p Toe amp 4/27 and L BKA 5/2 ?-Patient growing Klebsiella and MRSE (likely contaminant) in blood. ? ?Plan: ?Continue cefepime 1g IV q 24h  ?Will follow plans for length of therapy ? ? ?Height: '5\' 8"'$  (172.7 cm) ?Weight: 112.4 kg (247 lb 12.8 oz) ?IBW/kg (Calculated) : 63.9 ? ?Temp (24hrs), Avg:98.7 ?F (37.1 ?C), Min:97.7 ?F (36.5 ?C), Max:101.1 ?F (38.4 ?C) ? ?Recent Labs  ?Lab 05/29/21 ?9628 05/29/21 ?3662 05/31/21 ?0929 06/01/21 ?9476 06/02/21 ?1020 06/02/21 ?1243 06/03/21 ?0134 06/04/21 ?0203  ?WBC 15.0* 15.6*  --  17.8*  --   --  10.6* 12.9*  ?CREATININE  --  8.51*   < > 9.08* 5.90* 5.60* 6.49* 4.74*  ? < > = values in this interval not displayed.  ? ?  ?Estimated Creatinine Clearance: 17.4 mL/min (A) (by C-G formula based on SCr of 4.74 mg/dL (H)).   ? ?Allergies  ?Allergen Reactions  ? Bupropion Itching  ? Dilaudid [Hydromorphone] Other (See Comments)  ?  Apnea, required intubation  ? ? ?Antimicrobials this admission: ?Cefepime 4/24 >>  ?Vancomycin 4/24 >> 4/30 ? ?Dose adjustments this admission: ? ? ?Microbiology results: ?4/24 BCx: 2/4 MR s epi, 1/4 Enterobacter aerogenes (R: cefazolin, sens everything else) ?4/27BCID (1/4) Kleb aerogenes ? ?Hildred Laser, PharmD ?Clinical Pharmacist ?**Pharmacist phone directory can now be found on [email or web address] ([email or web address]).  Listed under [place]. ? ? ? ? ? ? ?

## 2021-06-04 NOTE — Progress Notes (Signed)
?[facility] ?Progress Note  ? ?Subjective:   ?Completed HD yesterday -net UF 1.5L d/t hypotension ?No new complaints this am. Would like to do inpatient rehab.  ? ?Objective ?Vitals:  ? 06/04/21 0102 06/04/21 0175 06/04/21 0346 06/04/21 0747  ?BP: 114/67 111/60  113/75  ?Pulse: 84 80  83  ?Resp: '20 19  16  '$ ?Temp: 98.9 ?F (37.2 ?C) 98.5 ?F (36.9 ?C)  98.4 ?F (36.9 ?C)  ?TempSrc: Oral Oral  Oral  ?SpO2: 97% 92%  91%  ?Weight:   112.4 kg   ?Height:      ? ?Physical Exam ?General: chronically ill appearing, sleepy female in NAD ?Heart: RRR, no mrg ?Lungs: CTAB, nml WOB ?Abdomen: soft, NTND ?Extremities: trace LE edema, woody appearance. L BKA in brace ?Dialysis Access: RU AVF +b/t  ? ?Filed Weights  ? 06/03/21 0731 06/03/21 1200 06/04/21 0346  ?Weight: 118 kg 114.5 kg 112.4 kg  ? ? ?Intake/Output Summary (Last 24 hours) at 06/04/2021 0958 ?Last data filed at 06/04/2021 0208 ?Gross per 24 hour  ?Intake 450 ml  ?Output 1500 ml  ?Net -1050 ml  ? ? ? ?Additional Objective ?Labs: ?Basic Metabolic Panel: ?Recent Labs  ?Lab 05/31/21 ?0929 06/01/21 ?1025 06/02/21 ?1020 06/02/21 ?1243 06/03/21 ?0134 06/04/21 ?0203  ?NA 132* 131*   < > 133* 130* 135  ?K 5.2* 5.3*   < > 4.5 4.7 4.3  ?CL 94* 92*   < > 94* 93* 96*  ?CO2 22 22  --   --  23 24  ?GLUCOSE 212* 243*   < > 223* 308* 147*  ?BUN 64* 75*   < > 44* 44* 25*  ?CREATININE 7.80* 9.08*   < > 5.60* 6.49* 4.74*  ?CALCIUM 8.6* 8.6*  --   --  8.3* 8.4*  ?PHOS 4.9* 6.9*  --   --   --  4.7*  ? < > = values in this interval not displayed.  ? ? ?Liver Function Tests: ?Recent Labs  ?Lab 05/31/21 ?0929 06/01/21 ?8527 06/04/21 ?0203  ?AST  --   --  73*  ?ALT  --   --  43  ?ALKPHOS  --   --  171*  ?BILITOT  --   --  0.8  ?PROT  --   --  7.6  ?ALBUMIN 2.5* 2.5* 2.7*  ? ? ?No results for input(s): LIPASE, AMYLASE in the last 168 hours. ?CBC: ?Recent Labs  ?Lab 05/29/21 ?7824 05/29/21 ?2353 06/01/21 ?6144 06/02/21 ?1020 06/03/21 ?0134 06/03/21 ?1312 06/04/21 ?0203  ?WBC 15.0* 15.6*  17.8*  --  10.6*  --  12.9*  ?NEUTROABS  --   --   --   --   --   --  10.3*  ?HGB 7.8* 7.6* 6.4*   < > 6.1* 7.4* 7.6*  ?HCT 24.2* 23.3* 20.1*   < > 20.0* 22.6* 23.6*  ?MCV 94.2 92.8 97.6  --  96.2  --  94.8  ?PLT 250 237 239  --  247  --  238  ? < > = values in this interval not displayed.  ? ? ?Blood Culture ?   ?Component Value Date/Time  ? SDES BLOOD LEFT HAND 05/30/2021 0136  ? SPECREQUEST  05/30/2021 0136  ?  BOTTLES DRAWN AEROBIC AND ANAEROBIC Blood Culture adequate volume  ? CULT  05/30/2021 0136  ?  NO GROWTH 5 DAYS ?Performed at [facility], [place] [address], [place], [place] [address] ?  ? REPTSTATUS 06/04/2021 FINAL 05/30/2021 0136  ? ? ?  Cardiac Enzymes: ?No results for input(s): CKTOTAL, CKMB, CKMBINDEX, TROPONINI in the last 168 hours. ?CBG: ?Recent Labs  ?Lab 06/03/21 ?0628 06/03/21 ?1223 06/03/21 ?1557 06/03/21 ?2139 06/04/21 ?1610  ?GLUCAP 265* 129* 140* 150* 141*  ? ? ?Iron Studies: No results for input(s): IRON, TIBC, TRANSFERRIN, FERRITIN in the last 72 hours. ?Lab Results  ?Component Value Date  ? INR 1.0 11/14/2018  ? INR 0.90 01/30/2011  ? ?Studies/Results: ?DG Chest 1 View ? ?Result Date: 06/04/2021 ?CLINICAL DATA:  Encounter for shortness of breath. EXAM: CHEST  1 VIEW COMPARISON:  Portable chest 04/26/2021. FINDINGS: The heart is moderately enlarged. There is perihilar vascular congestion and mild lower zonal interstitial consolidation consistent with edema. There are minimal pleural effusions. Patchy haziness similar to the prior study is noted in the lower lung fields and may suggest ground-glass edema, pneumonia or combination. The upper lung fields are generally clear. There is a stable mediastinal configuration with aortic atherosclerosis and tortuosity. Thoracic cage intact. IMPRESSION: 1. Cardiomegaly with mild vascular prominence. 2. Interstitial consolidation in the bases most likely due to edema, with patchy lower zonal haziness which could be ground-glass edema and/or  pneumonia. 3. Minimal pleural effusions. 4. Clinical correlation and radiographic follow-up recommended. 5. Aortic atherosclerosis. Electronically Signed   By: Telford Nab M.D.   On: 06/04/2021 02:07   ? ?Medications: ? sodium chloride    ? ceFEPime (MAXIPIME) IV 1 g (06/03/21 1444)  ? magnesium sulfate bolus IVPB    ? ? sodium chloride   Intravenous Once  ? sodium chloride   Intravenous Once  ? Chlorhexidine Gluconate Cloth  6 each Topical Q0600  ? clopidogrel  75 mg Oral Q breakfast  ? docusate sodium  100 mg Oral Daily  ? heparin  5,000 Units Subcutaneous Q8H  ? insulin aspart  0-5 Units Subcutaneous QHS  ? insulin aspart  0-6 Units Subcutaneous TID WC  ? insulin aspart protamine- aspart  35 Units Subcutaneous BID WC  ? pantoprazole  40 mg Oral Daily  ? rosuvastatin  40 mg Oral Daily  ? sodium chloride flush  3 mL Intravenous Q12H  ? sodium chloride flush  3 mL Intravenous Q12H  ? ? ?Dialysis Orders: ?MWF  - GOC ?4hrs, BFR 450, DFR 800,  EDW 110.6kg, 2K/ 2.5Ca, RU AVF,  ?-Heparin 10000 unit IV bolus TIW ? - No ESA 2/2 ?malignancy (no diagnosis - has stable renal lesion, pulm lesions -> saw onc 4/17, plan is for bronchoscopy with Bx of lung nodules in near future) ?- Calcitriol 2.5 mcg PO qHD ?- Parsabiv 7.5 mg IV qHD  ?  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  S/p balloon angioplasty and laser atherectomy to LATA 4/26, then amputation of L 4th/5th metarsals 05/28/2021 Dr. Donzetta Matters. 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  ?Volume overload:  Continue lowering volume as tolerated with HD.  ?ESRD: Continue HD per usual MWF schedule. Next HD 5/5  ?Hypertension: BP variable. Metoprolol has been held.  Hypotensive with HD, can utilize midodrine and albumin with HD for BP support.  ?Anemia of ESRD: Hgb 7 on admit, s/p 1U PRBCs 4/25, 2 units pRBC 5/1, 2 units on 5/3.  Hb 7.6.  Follow trends.   Not getting outpatient ESA d/t potential malignancy.    ?Secondary Hyperparathyroidism: Ca/Phos ok. Continue VDRA on MWF.  Unable to give parsabiv not on formulary.  Continue binders. ?Nutrition - Renal diet w/fluid restrictions.  ?DMT2 - per PMD ? ? ?  Lynnda Child PA-C ?[facility] ?06/04/2021,9:58 AM ? ? ?

## 2021-06-04 NOTE — TOC Initial Note (Signed)
Transition of Care (TOC) - Initial/Assessment Note  ? ? ?Patient Details  ?Name: [name] ?MRN: [id number] ?Date of Birth: 08-14-1965 ? ?Transition of Care (TOC) CM/SW Contact:    ?Vinie Sill, LCSW ?Phone Number: ?06/04/2021, 4:22 PM ? ?Clinical Narrative:           ? ?CSW  met with patient. CSW introduced self and explained role. CSW discussed short term rehab at [facility]. Patient initially declined because she believed she would have to give up her monthly income. CSW explained the SNF process and explained insurance coverage. Patient stats understanding and is agreeable to SNF placement. She acknowledges, she could benefits form short term rehab. She reports she lives with her spouse and son. She states her son takes her to dialysis MWF [facility] chair time 10:45 am. No preferred SNF at this time.   ? ?TOC will provide bed offers once available ?TOC will continue to follow and assist with discharge planning.  ? ?[name], MSW, LCSW ?Clinical Social Worker ? ?     ? ? ?Expected Discharge Plan: [place] ?Barriers to Discharge: Ship broker, Continued Medical Work up, SNF Pending bed offer ? ? ?Patient Goals and CMS Choice ?  ?  ?  ? ?Expected Discharge Plan and Services ?Expected Discharge Plan: [place] ?In-house Referral: Clinical Social Work ?  ?  ?Living arrangements for the past 2 months: [place] ?                ?  ?  ?  ?  ?  ?  ?  ?  ?  ?  ? ?Prior Living Arrangements/Services ?Living arrangements for the past 2 months: [place] ?Lives with:: Self, Spouse, Adult Children ?Patient language and need for interpreter reviewed:: No ?       ?Need for Family Participation in Patient Care: No (Comment) ?Care giver support system in place?: No (comment) ?  ?Criminal Activity/Legal Involvement Pertinent to Current Situation/Hospitalization: No - Comment as needed ? ?Activities of Daily Living ?  ?  ? ?Permission  Sought/Granted ?Permission sought to share information with : Family Supports ?  ? Share Information with NAME: Charvi Gammage ? Permission granted to share info w AGENCY: SNFs ? Permission granted to share info w Relationship: son ? Permission granted to share info w Contact Information: [phone] ? ?Emotional Assessment ?Appearance:: Appears stated age ?Attitude/Demeanor/Rapport: Engaged ?Affect (typically observed): Accepting, Pleasant, Appropriate ?Orientation: : Oriented to Self, Oriented to Place, Oriented to  Time, Oriented to Situation ?Alcohol / Substance Use: Not Applicable ?Psych Involvement: No (comment) ? ?Admission diagnosis:  Osteomyelitis ([place]) [M86.9] ?Osteomyelitis of left foot, unspecified type ([place]) [M86.9] ?Sepsis ([place]) [A41.9] ?Patient Active Problem List  ? Diagnosis Date Noted  ? Bacteremia due to Enterobacter aerogenes 06/03/2021  ? Hypotension 06/03/2021  ? Sepsis ([place]) 05/26/2021  ? PVD (peripheral vascular disease) ([place])   ? Osteomyelitis ([place]) 05/25/2021  ? Acute respiratory failure with hypoxia ([place]) 04/26/2021  ? Hypertensive urgency   ? Acute pulmonary edema (HCC)   ? Obesity (BMI 30-39.9) 01/10/2021  ? Finger infection, left hand, second digit 01/10/2021  ? Pulmonary nodule seen on imaging study 01/10/2021  ? 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 Ventral hernia 01/10/2021  ? 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 Rheumatoid arthritis ([place]) 01/10/2021  ? Osteomyelitis of left hand ([place]) 01/10/2021  ? Possible osteomyelitis of finger of left hand ([place])  01/09/2021  ? Abnormal CT scan, chest 11/27/2020  ? OSA (obstructive sleep apnea), concern for 11/27/2020  ? Thyroid nodule 04/10/2020  ? Renal mass, right 08/15/2019  ? Dyslipidemia 11/20/2018  ? Morbid obesity ([place]) 10/23/2018  ? Coronary artery disease 10/23/2018  ? Tobacco abuse 10/23/2018  ? Unspecified protein-calorie malnutrition ([place]) 10/11/2018  ? End-stage renal  disease on hemodialysis ([place]) 09/27/2018  ? ESRD (end stage renal disease) ([place]) 09/26/2018  ? Chronic pain disorder 09/26/2018  ? left foot wound with unreconstructable vascular disease 09/18/2018  ? Iron deficiency anemia, unspecified 01/10/2018  ? Coagulation defect, unspecified ([place]) 12/06/2017  ? Secondary hyperparathyroidism of renal origin ([place]) 09/01/2017  ? Disorder of phosphorus metabolism, unspecified 08/25/2017  ? Anemia in chronic kidney disease 08/24/2017  ? Left ventricular hypertrophy 06/17/2017  ? Diabetic retinopathy 05/21/2011  ? Diastolic dysfunction 46/56/8127  ? Hyperlipidemia associated with type 2 diabetes mellitus ([place]) 01/30/2011  ? Type 2 diabetes mellitus with diabetic chronic kidney disease ([place]) 01/29/2011  ? Hypertension associated with diabetes ([place])   ? ?PCP:  Sandi Mariscal, MD ?Pharmacy:   ?[facility] [address] ?[address] ?[place] [address] ?Phone: [phone] Fax: [phone] ? ?[facility] - [place], [place] - [address] ?[address] ?[address], [address] ?[place] [address] ?Phone: [phone] Fax: [phone] ? ? ? ? ?Social Determinants of Health (SDOH) Interventions ?  ? ?Readmission Risk Interventions ? ?  02/28/2020  ?  1:08 PM  ?Readmission Risk Prevention Plan  ?Transportation Screening Complete  ?PCP or Specialist Appt within 5-7 Days Complete  ?Home Care Screening Complete  ?Medication Review (RN CM) Complete  ? ? ? ?

## 2021-06-04 NOTE — Plan of Care (Signed)
?  Problem: Nutrition: Goal: Adequate nutrition will be maintained Outcome: Progressing   Problem: Activity: Goal: Risk for activity intolerance will decrease Outcome: Not Progressing   

## 2021-06-04 NOTE — Assessment & Plan Note (Addendum)
Initially suspected related to opiates in renal dz, but she's been off these several days now ?Suspect OSA/OHS as above, but no hypercarbia - I don't know if this would be typical cause without hypercarbia? ?Seems improved after bipap ?VBG without hypercarbia ?Continue to hold opiates ?Stop cefepime today, following EEG with her myoclonic jerks (as above) ?

## 2021-06-04 NOTE — [place] FL2 (Signed)
?[place] MEDICAID FL2 LEVEL OF CARE SCREENING TOOL  ?  ? ?IDENTIFICATION  ?Patient Name: ?[name] Birthdate: 07/21/1965 Sex: female Admission Date (Current Location): ?05/25/2021  ?[place] and [place] Number: ? [place] ?  Facility and Address:  ?The [place], [place] [address], [place], [place] [address] ?     Provider Number: ?[id number]  ?Attending Physician Name and Address:  ?Elodia Florence., * ? Relative Name and Phone Number:  ?  ?   ?Current Level of Care: ?Hospital Recommended Level of Care: ?[place] Prior Approval Number: ?  ? ?Date Approved/Denied: ?  PASRR Number: ?[id number] A ? ?Discharge Plan: ?SNF ?  ? ?Current Diagnoses: ?Patient Active Problem List  ? Diagnosis Date Noted  ? Bacteremia due to Enterobacter aerogenes 06/03/2021  ? Hypotension 06/03/2021  ? Sepsis ([place]) 05/26/2021  ? PVD (peripheral vascular disease) ([place])   ? Osteomyelitis ([place]) 05/25/2021  ? Acute respiratory failure with hypoxia ([place]) 04/26/2021  ? Hypertensive urgency   ? Acute pulmonary edema (HCC)   ? Obesity (BMI 30-39.9) 01/10/2021  ? Finger infection, left hand, second digit 01/10/2021  ? Pulmonary nodule seen on imaging study 01/10/2021  ? 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 Ventral hernia 01/10/2021  ? 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 Rheumatoid arthritis ([place]) 01/10/2021  ? Osteomyelitis of left hand ([place]) 01/10/2021  ? Possible osteomyelitis of finger of left hand ([place]) 01/09/2021  ? Abnormal CT scan, chest 11/27/2020  ? OSA (obstructive sleep apnea), concern for 11/27/2020  ? Thyroid nodule 04/10/2020  ? Renal mass, right 08/15/2019  ? Dyslipidemia 11/20/2018  ? Morbid obesity ([place]) 10/23/2018  ? Coronary artery disease 10/23/2018  ? Tobacco abuse 10/23/2018  ? Unspecified protein-calorie malnutrition ([place]) 10/11/2018  ? End-stage renal disease on hemodialysis ([place]) 09/27/2018  ? ESRD (end stage renal  disease) ([place]) 09/26/2018  ? Chronic pain disorder 09/26/2018  ? left foot wound with unreconstructable vascular disease 09/18/2018  ? Iron deficiency anemia, unspecified 01/10/2018  ? Coagulation defect, unspecified ([place]) 12/06/2017  ? Secondary hyperparathyroidism of renal origin ([place]) 09/01/2017  ? Disorder of phosphorus metabolism, unspecified 08/25/2017  ? Anemia in chronic kidney disease 08/24/2017  ? Left ventricular hypertrophy 06/17/2017  ? Diabetic retinopathy 05/21/2011  ? Diastolic dysfunction 23/53/6144  ? Hyperlipidemia associated with type 2 diabetes mellitus ([place]) 01/30/2011  ? Type 2 diabetes mellitus with diabetic chronic kidney disease ([place]) 01/29/2011  ? Hypertension associated with diabetes ([place])   ? ? ?Orientation RESPIRATION BLADDER Height & Weight   ?  ?Self, Time, Situation, Place ? O2 Continent Weight: 247 lb 12.8 oz (112.4 kg) ?Height:  '5\' 8"'$  (172.7 cm)  ?BEHAVIORAL SYMPTOMS/MOOD NEUROLOGICAL BOWEL NUTRITION STATUS  ?    Continent Diet (please see discharge summary)  ?AMBULATORY STATUS COMMUNICATION OF NEEDS Skin   ?Supervision Verbally Surgical wounds (Left BKA) ?  ?  ?  ?    ?     ?     ? ? ?Personal Care Assistance Level of Assistance  ?Bathing, Feeding, Dressing Bathing Assistance: Limited assistance ?Feeding assistance: Independent ?Dressing Assistance: Limited assistance ?   ? ?Functional Limitations Info  ?Sight, Hearing, Speech Sight Info: Adequate ?Hearing Info: Adequate ?Speech Info: Adequate  ? ? ?SPECIAL CARE FACTORS FREQUENCY  ?PT (By licensed PT), OT (By licensed OT)   ?  ?PT Frequency: 5x per week ?OT Frequency: 5x per week ?  ?  ?  ?   ? ? ?  Contractures Contractures Info: Not present  ? ? ?Additional Factors Info  ?Code Status, Allergies Code Status Info: FULL ?Allergies Info: Bupropion,Dilaudid ?  ?  ?  ?   ? ?Current Medications (06/04/2021):  This is the current hospital active medication list ?Current Facility-Administered Medications  ?Medication Dose Route Frequency  Provider Last Rate Last Admin  ? (feeding supplement) PROSource Plus liquid 30 mL  30 mL Oral BID BM Ejigiri, Thomos Lemons, PA-C   30 mL at 06/04/21 1400  ? 0.9 %  sodium chloride infusion (Manually program via Guardrails IV Fluids)   Intravenous Once Dagoberto Ligas, PA-C      ? 0.9 %  sodium chloride infusion (Manually program via Guardrails IV Fluids)   Intravenous Once Elodia Florence., MD      ? 0.9 %  sodium chloride infusion  250 mL Intravenous PRN Dagoberto Ligas, PA-C      ? acetaminophen (TYLENOL) tablet 650 mg  650 mg Oral Q6H PRN Dagoberto Ligas, PA-C      ? Or  ? acetaminophen (TYLENOL) suppository 650 mg  650 mg Rectal Q6H PRN Dagoberto Ligas, PA-C   650 mg at 06/04/21 0003  ? acetaminophen (TYLENOL) tablet 650 mg  650 mg Oral Q4H PRN Dagoberto Ligas, PA-C   650 mg at 06/04/21 1450  ? ceFEPIme (MAXIPIME) 1 g in sodium chloride 0.9 % 100 mL IVPB  1 g Intravenous Q24H Dagoberto Ligas, PA-C 200 mL/hr at 06/04/21 1019 1 g at 06/04/21 1019  ? Chlorhexidine Gluconate Cloth 2 % PADS 6 each  6 each Topical Q0600 Dagoberto Ligas, PA-C   6 each at 06/04/21 0542  ? clopidogrel (PLAVIX) tablet 75 mg  75 mg Oral Q breakfast Dagoberto Ligas, PA-C   75 mg at 06/04/21 9373  ? docusate sodium (COLACE) capsule 100 mg  100 mg Oral Daily Dagoberto Ligas, PA-C   100 mg at 06/04/21 1015  ? heparin injection 5,000 Units  5,000 Units Subcutaneous Q8H Dagoberto Ligas, PA-C   5,000 Units at 06/04/21 4287  ? insulin aspart (novoLOG) injection 0-5 Units  0-5 Units Subcutaneous QHS Dagoberto Ligas, PA-C   3 Units at 06/02/21 2247  ? insulin aspart (novoLOG) injection 0-6 Units  0-6 Units Subcutaneous TID WC Dagoberto Ligas, PA-C   3 Units at 06/03/21 6811  ? insulin aspart protamine- aspart (NOVOLOG MIX 70/30) injection 35 Units  35 Units Subcutaneous BID WC Dagoberto Ligas, PA-C   35 Units at 06/04/21 1703  ? midodrine (PROAMATINE) tablet 10 mg  10 mg Oral PRN Dagoberto Ligas, PA-C   10 mg at 06/03/21 5726  ?  naloxone [facility]) injection 0.4 mg  0.4 mg Intravenous PRN Elodia Florence., MD      ? ondansetron [facility]) injection 4 mg  4 mg Intravenous Q6H PRN Dagoberto Ligas, PA-C   4 mg at 05/31/21 2336  ? pantoprazole (PROTONIX) EC tablet 40 mg  40 mg Oral Daily Dagoberto Ligas, PA-C   40 mg at 06/04/21 1015  ? polyethylene glycol (MIRALAX / GLYCOLAX) packet 17 g  17 g Oral Daily PRN Dagoberto Ligas, PA-C      ? rosuvastatin (CRESTOR) tablet 40 mg  40 mg Oral Daily Dagoberto Ligas, PA-C   40 mg at 06/04/21 2035  ? sodium chloride flush (NS) 0.9 % injection 3 mL  3 mL Intravenous Q12H Dagoberto Ligas, PA-C   3 mL at 06/02/21 2240  ? sodium chloride flush (NS) 0.9 % injection 3 mL  3 mL Intravenous  Q12H Dagoberto Ligas, PA-C   3 mL at 06/04/21 5102  ? sodium chloride flush (NS) 0.9 % injection 3 mL  3 mL Intravenous PRN Dagoberto Ligas, PA-C      ? ? ? ?Discharge Medications: ?Please see discharge summary for a list of discharge medications. ? ?Relevant Imaging Results: ? ?Relevant Lab Results: ? ? ?Additional Information ?SSN [id number] Moderna COVID-19 Vaccine 07/30/2020 , 12/05/2019 , 05/18/2019 , 04/20/2019, [facility] MWF arrive 10:25am, chair time 10:45am ? ?Vinie Sill, LCSW ? ? ? ? ?

## 2021-06-04 NOTE — Plan of Care (Signed)
  Problem: Clinical Measurements: Goal: Will remain free from infection Outcome: Progressing Goal: Respiratory complications will improve Outcome: Progressing Goal: Cardiovascular complication will be avoided Outcome: Progressing   

## 2021-06-04 NOTE — Progress Notes (Signed)
Cristina Gong, RN ?Rehab Admission Coordinator ?Physical Medicine and Rehabilitation ?Progress Notes     ?Signed ?Date of Service:  06/02/2021  3:03 PM ?  ?Signed    ?  ?Show:Clear all ?'[x]'$ Written'[x]'$ Templated'[]'$ Copied ? ?Added by: ?'[x]'$ Cristina Gong, RN ? ?'[]'$ Hover for details ?   ?   ?   ?   ?   ?   ?   ?   ?   ?   ?   ?   ?   ?   ?   ?   ?   ?   ?Inpatient Rehabilitation Admissions Coordinator  ?  ?Rehab consult received due to BKA. Current payor trends are that CIR will not be approved for this diagnosis. Other rehab venues to be pursued. We will sign off. ?  ?Danne Baxter, RN, MSN ?Rehab Admissions Coordinator ?[phone] ?06/02/2021 3:04 PM ?   ?  ?  ?  ? ?Note Details ? ?Teresa Pelton, RN File Time 06/02/2021  3:05 PM  ?[place] Type Rehab Admission Coordinator Status Signed  ?Last Editor Cristina Gong, RN Service Physical Medicine and Rehabilitation  ?Hospital Acct # [id number] Admit Date 05/25/2021  ? ?

## 2021-06-04 NOTE — Progress Notes (Signed)
RT unable to retrieve ABG from patient due to restricted sites because of dialysis and IV's.  Suggested to RN to have DR. stick patient femoral.  ?

## 2021-06-04 NOTE — Evaluation (Signed)
Physical Therapy Evaluation ?Patient Details ?Name: [name] ?MRN: [id number] ?DOB: 25-Jan-1966 ?Today's Date: 06/04/2021 ? ?History of Present Illness ? 56 y.o. female presented 05/25/21 with infected L 5th toe. S/p L leg aortogram, angiogram, laser atherectomy of the anterior tibial artery, and balloon angioplasty of anterior tibial artery 4/26. 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 PMH: hypertension, diabetes, hyperlipidemia, CHF, ESRD on HD, chronic pain, CAD, anemia, Buerger disease, rheumatoid arthritis ?  ?Clinical Impression ? Pt presents with condition above and deficits mentioned  below, see PT Problem List. PTA, she was living with her family in a 1-level house with 1 STE. At baseline, she was mod I for mobility using the rollator in the home and she would use her w/c in the community. She would intermittently need assistance to get OOB. She endorses x3 falls in the past 6 months. Currently, pt is limited by L residual limb pain and displays deficits in gross overall strength, R leg sensation, balance, and activity tolerance. She is at risk for falls and is requiring modA to scoot laterally on EOB (scoots better to her R) and maxA with R knee block and bil UE support to transfer and stand briefly at EOB. She would benefit from intensive therapy in the AIR setting, but per chart insurance will not cover AIR. Thus, recommending SNF for rehab. Will continue to follow acutely. ?   ? ?Recommendations for follow up therapy are one component of a multi-disciplinary discharge planning process, led by the attending physician.  Recommendations may be updated based on patient status, additional functional criteria and insurance authorization. ? ?Follow Up Recommendations Skilled nursing-short term rehab (<3 hours/day) (would recommend AIR but insurance won't approve) ? ?  ?Assistance Recommended at Discharge Frequent or constant Supervision/Assistance  ?Patient can return home with  the following ? Two people to help with walking and/or transfers;A lot of help with bathing/dressing/bathroom;Assistance with cooking/housework;Assist for transportation;Help with stairs or ramp for entrance ? ?  ?Equipment Recommendations BSC/3in1;Rolling walker (2 wheels) (drop-arm bedside commode; current RW is broken)  ?Recommendations for Other Services ?    ?  ?Functional Status Assessment Patient has had a recent decline in their functional status and demonstrates the ability to make significant improvements in function in a reasonable and predictable amount of time.  ? ?  ?Precautions / Restrictions Precautions ?Precautions: Fall ?Precaution Comments: new L BKA 06/02/21 (knee immobilizer on) ?Required Braces or Orthoses:  (KI present and on upon arrival, placed order for limb protector s/p BKA instead) ?Restrictions ?Weight Bearing Restrictions: Yes ?LLE Weight Bearing: Non weight bearing  ? ?  ? ?Mobility ? Bed Mobility ?  ?  ?  ?  ?  ?  ?  ?General bed mobility comments: Pt sitting EOB upon arrival. ?  ? ?Transfers ?Overall transfer level: Needs assistance ?Equipment used: 1 person hand held assist ?Transfers: Sit to/from Stand ?Sit to Stand: Max assist, From elevated surface ?  ?  ?  ?  ?  ?General transfer comment: EOB elevated and R knee blocked, cuing pt to hold onto therapist anterior to her, providing maxA under buttocks to power up to stand at EOB. Scoots slightly, better to R than to L, with modA. ?  ? ?Ambulation/Gait ?  ?  ?  ?  ?  ?  ?  ?General Gait Details: deferred ? ?Stairs ?  ?  ?  ?  ?  ? ?Wheelchair Mobility ?  ? ?Modified Rankin (Stroke Patients  Only) ?  ? ?  ? ?Balance Overall balance assessment: Needs assistance ?Sitting-balance support: Bilateral upper extremity supported, Feet supported, No upper extremity supported ?Sitting balance-Leahy Scale: Fair ?Sitting balance - Comments: Able to sit statically without UE support or LOB but tends to prefer UE support ?  ?Standing balance  support: Bilateral upper extremity supported ?Standing balance-Leahy Scale: Zero ?Standing balance comment: MaxA, R knee block, and bil UE support to stand < 15 sec ?  ?  ?  ?  ?  ?  ?  ?  ?  ?  ?  ?   ? ? ? ?Pertinent Vitals/Pain Pain Assessment ?Pain Assessment: Faces ?Faces Pain Scale: Hurts even more ?Pain Location: L residual limb ?Pain Descriptors / Indicators: Discomfort, Grimacing, Operative site guarding ?Pain Intervention(s): Limited activity within patient's tolerance, Monitored during session, Repositioned  ? ? ?Home Living Family/patient expects to be discharged to:: Private residence ?Living Arrangements: Spouse/significant other;Children ?Available Help at Discharge: Family ?Type of Home: House ?Home Access: Stairs to enter ?  ?Entrance Stairs-Number of Steps: 1 ?  ?Home Layout: One level ?Home Equipment: Conservation officer, nature (2 wheels);Rollator (4 wheels);Shower seat;Grab bars - tub/shower;Wheelchair - manual;Hand held shower head (RW is broken) ?Additional Comments: pt endorses using RW/Rollator for in home mobility, w/c stays in car for community mobility. son works 3rd shift husband works 1st shift but she always has someone home with her  ?  ?Prior Function Prior Level of Function : Needs assist;History of Falls (last six months) ?  ?  ?  ?  ?  ?  ?Mobility Comments: Pt ambulates in the home mod I with rollator but uses w/c in community. Intermittently needs assistance to get OOB. x3 falls in past 6 months ?ADLs Comments: husband and son have had to assist wtih dressing since thumb amputation for pulling pants/socks up and buttoning/zipping. but endorses indep with bathing. ?  ? ? ?Hand Dominance  ? Dominant Hand: Right ? ?  ?Extremity/Trunk Assessment  ? Upper Extremity Assessment ?Upper Extremity Assessment: Defer to OT evaluation ?  ? ?Lower Extremity Assessment ?Lower Extremity Assessment: RLE deficits/detail;LLE deficits/detail ?RLE Deficits / Details: reports "tingling" in foot but sensation  intact in knee and superiorly; Grossly 4/5 strength ?RLE Sensation: decreased light touch ?LLE Deficits / Details: s/p BKA with pt reporting detecting touch distally; knee immobilizer on upon arrival ?  ? ?Cervical / Trunk Assessment ?Cervical / Trunk Assessment: Kyphotic  ?Communication  ? Communication: HOH (out of R ear)  ?Cognition Arousal/Alertness: Awake/alert ?Behavior During Therapy: [facility] for tasks assessed/performed ?Overall Cognitive Status: Within Functional Limits for tasks assessed ?  ?  ?  ?  ?  ?  ?  ?  ?  ?  ?  ?  ?  ?  ?  ?  ?General Comments: Pt asking PT to repeat often, likely due to R ear HOH per pt ?  ?  ? ?  ?General Comments General comments (skin integrity, edema, etc.): educated pt on elevating residual limb to manage edema ? ?  ?Exercises    ? ?Assessment/Plan  ?  ?PT Assessment Patient needs continued PT services  ?PT Problem List Decreased strength;Decreased activity tolerance;Decreased balance;Decreased mobility;Decreased safety awareness;Impaired sensation;Pain;Decreased skin integrity ? ?   ?  ?PT Treatment Interventions DME instruction;Gait training;Stair training;Functional mobility training;Therapeutic activities;Therapeutic exercise;Balance training;Neuromuscular re-education;Cognitive remediation;Patient/family education;Wheelchair mobility training   ? ?PT Goals (Current goals can be found in the Care Plan section)  ?Acute Rehab PT Goals ?Patient Stated Goal: to get  better ?PT Goal Formulation: With patient ?Time For Goal Achievement: 06/18/21 ?Potential to Achieve Goals: Good ? ?  ?Frequency Min 3X/week ?  ? ? ?Co-evaluation   ?  ?  ?  ?  ? ? ?  ?AM-PAC PT "6 Clicks" Mobility  ?Outcome Measure Help needed turning from your back to your side while in a flat bed without using bedrails?: A Little ?Help needed moving from lying on your back to sitting on the side of a flat bed without using bedrails?: A Little ?Help needed moving to and from a bed to a chair (including a  wheelchair)?: A Lot ?Help needed standing up from a chair using your arms (e.g., wheelchair or bedside chair)?: A Lot ?Help needed to walk in hospital room?: Total ?Help needed climbing 3-5 steps with a railing? : Total ?6 C

## 2021-06-04 NOTE — Progress Notes (Addendum)
[facility] ? ?Subjective  - No new complaints this am.   ? ? ?Objective ?111/60 ?80 ?98.5 ?F (36.9 ?C) (Oral) ?19 ?92% ? ?Intake/Output Summary (Last 24 hours) at 06/04/2021 0726 ?Last data filed at 06/04/2021 0208 ?Gross per 24 hour  ?Intake 766.67 ml  ?Output 1500 ml  ?Net -733.33 ml  ? ? 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 ? 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   ? ?[name] ?06/04/2021 ?7:26 AM ?-- ? ?Dressing removed. Stump viable, suture line intact, healthy ?PT, consider inpt rehab ? ?[name] ? ? ?Laboratory ?Lab Results: ?Recent Labs  ?  06/03/21 ?0134 06/03/21 ?1312 06/04/21 ?0203  ?WBC 10.6*  --  12.9*  ?HGB 6.1* 7.4* 7.6*  ?HCT 20.0* 22.6* 23.6*  ?PLT 247  --  238  ? ?BMET ?Recent Labs  ?  06/03/21 ?0134 06/04/21 ?0203  ?NA 130* 135  ?K 4.7 4.3  ?CL 93* 96*  ?CO2 23 24  ?GLUCOSE 308* 147*  ?BUN 44* 25*  ?CREATININE 6.49* 4.74*  ?CALCIUM 8.3* 8.4*  ? ? ?COAG ?Lab Results  ?Component Value Date  ? INR 1.0 11/14/2018  ? INR 0.90 01/30/2011  ? ?No results found for: PTT ? ? ? ?

## 2021-06-04 NOTE — Progress Notes (Signed)
Orthopedic Tech Progress Note ?Patient Details:  ?[name] ?1965-03-22 ?[id number] ? ?Patient ID: ERA PARR, female   DOB: 1965/11/29, 56 y.o.   MRN: [id number] ? ?Charline Bills Matty Vanroekel ?06/04/2021, 5:16 PM ?Called in order with [facility]. ?

## 2021-06-05 ENCOUNTER — Inpatient Hospital Stay (HOSPITAL_COMMUNITY): Payer: Medicare Other

## 2021-06-05 DIAGNOSIS — I4891 Unspecified atrial fibrillation: Secondary | ICD-10-CM | POA: Diagnosis not present

## 2021-06-05 DIAGNOSIS — M86272 Subacute osteomyelitis, left ankle and foot: Secondary | ICD-10-CM | POA: Diagnosis not present

## 2021-06-05 LAB — ECHOCARDIOGRAM COMPLETE
AR max vel: 1.6 cm2
AV Area VTI: 1.73 cm2
AV Area mean vel: 1.68 cm2
AV Mean grad: 9 mmHg
AV Peak grad: 17.3 mmHg
Ao pk vel: 2.08 m/s
Height: 68 in
S' Lateral: 2.3 cm
Weight: 3971.81 oz

## 2021-06-05 LAB — CBC WITH DIFFERENTIAL/PLATELET
Abs Immature Granulocytes: 0.23 10*3/uL — ABNORMAL HIGH (ref 0.00–0.07)
Basophils Absolute: 0.1 10*3/uL (ref 0.0–0.1)
Basophils Relative: 0 %
Eosinophils Absolute: 0.3 10*3/uL (ref 0.0–0.5)
Eosinophils Relative: 3 %
HCT: 24 % — ABNORMAL LOW (ref 36.0–46.0)
Hemoglobin: 7.5 g/dL — ABNORMAL LOW (ref 12.0–15.0)
Immature Granulocytes: 2 %
Lymphocytes Relative: 9 %
Lymphs Abs: 1.1 10*3/uL (ref 0.7–4.0)
MCH: 29.5 pg (ref 26.0–34.0)
MCHC: 31.3 g/dL (ref 30.0–36.0)
MCV: 94.5 fL (ref 80.0–100.0)
Monocytes Absolute: 0.6 10*3/uL (ref 0.1–1.0)
Monocytes Relative: 5 %
Neutro Abs: 9.4 10*3/uL — ABNORMAL HIGH (ref 1.7–7.7)
Neutrophils Relative %: 81 %
Platelets: 259 10*3/uL (ref 150–400)
RBC: 2.54 MIL/uL — ABNORMAL LOW (ref 3.87–5.11)
RDW: 16 % — ABNORMAL HIGH (ref 11.5–15.5)
WBC: 11.6 10*3/uL — ABNORMAL HIGH (ref 4.0–10.5)
nRBC: 0.3 % — ABNORMAL HIGH (ref 0.0–0.2)

## 2021-06-05 LAB — TSH: TSH: 3.789 u[IU]/mL (ref 0.350–4.500)

## 2021-06-05 LAB — HEMOGLOBIN AND HEMATOCRIT, BLOOD
HCT: 24.9 % — ABNORMAL LOW (ref 36.0–46.0)
Hemoglobin: 8 g/dL — ABNORMAL LOW (ref 12.0–15.0)

## 2021-06-05 LAB — COMPREHENSIVE METABOLIC PANEL
ALT: 24 U/L (ref 0–44)
AST: 42 U/L — ABNORMAL HIGH (ref 15–41)
Albumin: 2.6 g/dL — ABNORMAL LOW (ref 3.5–5.0)
Alkaline Phosphatase: 183 U/L — ABNORMAL HIGH (ref 38–126)
Anion gap: 14 (ref 5–15)
BUN: 40 mg/dL — ABNORMAL HIGH (ref 6–20)
CO2: 23 mmol/L (ref 22–32)
Calcium: 8.3 mg/dL — ABNORMAL LOW (ref 8.9–10.3)
Chloride: 96 mmol/L — ABNORMAL LOW (ref 98–111)
Creatinine, Ser: 6.32 mg/dL — ABNORMAL HIGH (ref 0.44–1.00)
GFR, Estimated: 7 mL/min — ABNORMAL LOW (ref >60)
Glucose, Bld: 155 mg/dL — ABNORMAL HIGH (ref 70–99)
Potassium: 4.2 mmol/L (ref 3.5–5.1)
Sodium: 133 mmol/L — ABNORMAL LOW (ref 135–145)
Total Bilirubin: 0.6 mg/dL (ref 0.3–1.2)
Total Protein: 7.6 g/dL (ref 6.5–8.1)

## 2021-06-05 LAB — GLUCOSE, CAPILLARY
Glucose-Capillary: 139 mg/dL — ABNORMAL HIGH (ref 70–99)
Glucose-Capillary: 113 mg/dL — ABNORMAL HIGH (ref 70–99)
Glucose-Capillary: 239 mg/dL — ABNORMAL HIGH (ref 70–99)
Glucose-Capillary: 262 mg/dL — ABNORMAL HIGH (ref 70–99)

## 2021-06-05 LAB — MAGNESIUM: Magnesium: 1.9 mg/dL (ref 1.7–2.4)

## 2021-06-05 LAB — PHOSPHORUS: Phosphorus: 4.3 mg/dL (ref 2.5–4.6)

## 2021-06-05 MED ORDER — MIDODRINE HCL 5 MG PO TABS
10.0000 mg | ORAL_TABLET | Freq: Once | ORAL | Status: AC
  Administered 2021-06-05: 10 mg via ORAL
  Filled 2021-06-05: qty 2

## 2021-06-05 MED ORDER — PERFLUTREN LIPID MICROSPHERE
1.0000 mL | INTRAVENOUS | Status: AC | PRN
  Administered 2021-06-05: 3 mL via INTRAVENOUS
  Filled 2021-06-05: qty 10

## 2021-06-05 MED ORDER — METOPROLOL TARTRATE 5 MG/5ML IV SOLN: 5.0000 mg | INTRAVENOUS | Status: DC | PRN

## 2021-06-05 MED ORDER — METOPROLOL TARTRATE 25 MG PO TABS
25.0000 mg | ORAL_TABLET | Freq: Two times a day (BID) | ORAL | Status: DC
  Administered 2021-06-05: 25 mg via ORAL
  Filled 2021-06-05: qty 1

## 2021-06-05 MED ORDER — SODIUM CHLORIDE 0.9 % IV BOLUS
250.0000 mL | Freq: Once | INTRAVENOUS | Status: AC
  Administered 2021-06-05: 250 mL via INTRAVENOUS

## 2021-06-05 MED ORDER — METOPROLOL TARTRATE 12.5 MG HALF TABLET
12.5000 mg | ORAL_TABLET | Freq: Two times a day (BID) | ORAL | Status: DC
  Administered 2021-06-06 – 2021-06-11 (×11): 12.5 mg via ORAL
  Filled 2021-06-05 (×12): qty 1

## 2021-06-05 MED ORDER — LACTATED RINGERS IV BOLUS
250.0000 mL | Freq: Once | INTRAVENOUS | Status: AC
  Administered 2021-06-05: 250 mL via INTRAVENOUS

## 2021-06-05 MED ORDER — HEPARIN (PORCINE) 25000 UT/250ML-% IV SOLN
2500.0000 [IU]/h | INTRAVENOUS | Status: AC
  Administered 2021-06-05: 1400 [IU]/h via INTRAVENOUS
  Administered 2021-06-07: 1950 [IU]/h via INTRAVENOUS
  Administered 2021-06-08: 2500 [IU]/h via INTRAVENOUS
  Filled 2021-06-05 (×7): qty 250

## 2021-06-05 MED ORDER — METOPROLOL TARTRATE 12.5 MG HALF TABLET: 12.5000 mg | ORAL_TABLET | Freq: Two times a day (BID) | ORAL | Status: DC

## 2021-06-05 NOTE — TOC Progression Note (Signed)
Transition of Care (TOC) - Progression Note  ? ? ?Patient Details  ?Name: [name] ?MRN: [id number] ?Date of Birth: 09-12-1965 ? ?Transition of Care (TOC) CM/SW Contact  ?Vinie Sill, LCSW ?Phone Number: ?06/05/2021, 4:19 PM ? ?Clinical Narrative:    ? ?Patient and family has selected [facility]-  ? ?CSW sent message to [facility] - requesting confirmation of bed offer -CSW waiting on response ? ?TOC will continue to follow and assist with bed offer. ? ? ?[name], MSW, LCSW ?Clinical Social Worker ? ?  ? ? ? ?Expected Discharge Plan: [place] ?Barriers to Discharge: Ship broker (waiting on [facility]  to confirm of SNF placement & transport to HD clinic) ? ?Expected Discharge Plan and Services ?Expected Discharge Plan: [place] ?In-house Referral: Clinical Social Work ?  ?  ?Living arrangements for the past 2 months: [place] ?                ?  ?  ?  ?  ?  ?  ?  ?  ?  ?  ? ? ?Social Determinants of Health (SDOH) Interventions ?  ? ?Readmission Risk Interventions ? ?  02/28/2020  ?  1:08 PM  ?Readmission Risk Prevention Plan  ?Transportation Screening Complete  ?PCP or Specialist Appt within 5-7 Days Complete  ?Home Care Screening Complete  ?Medication Review (RN CM) Complete  ? ? ?

## 2021-06-05 NOTE — Progress Notes (Signed)
ANTICOAGULATION CONSULT NOTE - Initial Consult ? ?Pharmacy Consult for heparin ?Indication: atrial fibrillation ? ?Allergies  ?Allergen Reactions  ? Bupropion Itching  ? Dilaudid [Hydromorphone] Other (See Comments)  ?  Apnea, required intubation  ? ? ?Patient Measurements: ?Height: 5' 8"  (172.7 cm) ?Weight: 112.6 kg (248 lb 3.8 oz) ?IBW/kg (Calculated) : 63.9 ?Heparin Dosing Weight: 93kg ? ?Vital Signs: ?Temp: 98.8 ?F (37.1 ?C) (05/05 1301) ?Temp Source: Oral (05/05 1301) ?BP: 89/52 (05/05 1755) ?Pulse Rate: 119 (05/05 1755) ? ?Labs: ?Recent Labs  ?  06/03/21 ?0134 06/03/21 ?1312 06/04/21 ?0203 06/04/21 ?1437 06/05/21 ?0210 06/05/21 ?1640  ?HGB 6.1*   < > 7.6* 8.0* 7.5* 8.0*  ?HCT 20.0*   < > 23.6* 25.7* 24.0* 24.9*  ?PLT 247  --  238  --  259  --   ?CREATININE 6.49*  --  4.74*  --  6.32*  --   ? < > = values in this interval not displayed.  ? ? ?Estimated Creatinine Clearance: 13.1 mL/min (A) (by C-G formula based on SCr of 6.32 mg/dL (H)). ? ? ?Medical History: ?Past Medical History:  ?Diagnosis Date  ? Anaphylactic shock, unspecified, initial encounter 09/04/2018  ? Anemia   ? Anxiety   ? CAD (coronary artery disease)   ? Nonobstructive on CT 2019  ? CHF (congestive heart failure) ([place])   ? Chronic bilateral pleural effusions 10/23/2018  ? COVID-19 10/2018  ? COVID-19 virus detected 10/10/2018  ? COVID-19 virus infection 09/18/2018  ? ESRD (end stage renal disease) ([place])   ? Dialysis TTHSat- 3rd st  ? Gangrene ([place]) 09/18/2018  ? Headache(784.0)   ? High cholesterol   ? History of blood transfusion   ? Hypertension   ? Mitral regurgitation   ? moderate to severe MR 10/2018 echo  ? Nerve pain   ? "they say I have L5 nerve damage; my lumbar"  ? Neuropathy 01/29/2011  ? 04/2011 MRI L-spine:  L5-S1: Bulge/shallow broad-based protrusion greatest centrally and in the right posterior lateral position. Minimal crowding of the  upper aspect of the S1 nerve root greater on the right. Left lateral disc osteophyte with mild  encroachment upon but not  significant compression of the exiting left L5 nerve root.   05/2011: Dr. Sherwood Gambler ([facility]): DJD and mild disc bu  ? Pericardial effusion   ? Pneumonia   ? ; 11/16/2018- "touch of pneumonia" - seen in ED- 11/14/2018- on antibiotic. Has had it x 2  ? Pneumonia 10/10/2018  ? PVD (peripheral vascular disease) ([place])   ? Right leg stent in [place].  (No records)  ? Restless legs   ? Rheumatoid arthritis ([place])   ? "knees" "hands", "RA"  ? Sleep apnea   ? does not use Cpap  ? Thoracic ascending aortic aneurysm ([place])   ? 4.4 cm 11/14/18 CTA  ? Type II diabetes mellitus ([place])   ? Uterine leiomyoma 01/10/2021  ? ? ?Medications:  ?Medications Prior to Admission  ?Medication Sig Dispense Refill Last Dose  ? acetaminophen (TYLENOL) 500 MG tablet Take 500-1,000 mg by mouth daily as needed for headache (pain).   Past Week  ? Ascorbic Acid (VITAMIN C PO) Take 1 tablet by mouth See admin instructions. At dialysis Monday,Wednesday and friday   Past Week  ? AURYXIA 1 GM 210 MG(Fe) tablet Take 420-840 mg by mouth See admin instructions. 840 mg twice daily with meals, occasionally take 420 mg with a snack   Past Week  ? b complex-vitamin c-folic acid (  NEPHRO-VITE) 0.8 MG TABS tablet Take 1 tablet by mouth every Monday, Wednesday, and Friday with hemodialysis.   Past Week  ? HUMIRA PEN 40 MG/0.4ML PNKT Inject 40 mg into the skin every 14 (fourteen) days.   Past Week  ? hydrOXYzine (ATARAX/VISTARIL) 25 MG tablet Take 25 mg by mouth every morning.   Past Week  ? Insulin Lispro Prot & Lispro (HUMALOG 75/25 MIX) (75-25) 100 UNIT/ML Kwikpen Inject 35 Units into the skin 2 (two) times daily with a meal. (Patient taking differently: Inject 35 Units into the skin in the morning and at bedtime.) 45 mL 3 Past Week  ? Methoxy PEG-Epoetin Beta (MIRCERA IJ) Dialysis Monday,Wednesday and friday   Past Week  ? metoprolol succinate (TOPROL-XL) 100 MG 24 hr tablet Take 100 mg by mouth daily.   Past Week at un[facility]  ?  Multiple Vitamins-Minerals (ZINC PO) Take 1 tablet by mouth every morning.   Past Week  ? OVER THE COUNTER MEDICATION Take 1 capsule by mouth every morning. Omega XL   Past Month  ? oxyCODONE-acetaminophen (PERCOCET) 10-325 MG tablet Take 1 tablet by mouth 3 (three) times daily as needed for pain.   Past Week  ? pramipexole (MIRAPEX) 0.5 MG tablet Take 0.5 mg by mouth daily.   1 Past Week  ? rosuvastatin (CRESTOR) 40 MG tablet TAKE 1 TABLET BY MOUTH DAILY *PATIENT NEEDS APPOINTMENT* (Patient taking differently: Take 40 mg by mouth daily.) 30 tablet 10 Past Week  ? albuterol (VENTOLIN HFA) 108 (90 Base) MCG/ACT inhaler Inhale 2 puffs into the lungs every 6 (six) hours as needed for wheezing or shortness of breath. (Patient not taking: Reported on 05/27/2021) 6.7 g 0 Not Taking  ? glucose blood (ONETOUCH VERIO) test strip 1 each by Other route 2 (two) times daily. And lancets 2/day 200 each 3   ? NARCAN 4 MG/0.1ML LIQD nasal spray kit Place 1 spray into the nose as needed (accidental overdose.).  (Patient not taking: Reported on 05/27/2021)   Not Taking  ? ?Scheduled:  ? (feeding supplement) PROSource Plus  30 mL Oral BID BM  ? sodium chloride   Intravenous Once  ? sodium chloride   Intravenous Once  ? Chlorhexidine Gluconate Cloth  6 each Topical Q0600  ? clopidogrel  75 mg Oral Q breakfast  ? docusate sodium  100 mg Oral Daily  ? insulin aspart  0-5 Units Subcutaneous QHS  ? insulin aspart  0-6 Units Subcutaneous TID WC  ? insulin aspart protamine- aspart  35 Units Subcutaneous BID WC  ? metoprolol tartrate  12.5 mg Oral BID  ? midodrine  10 mg Oral Once  ? pantoprazole  40 mg Oral Daily  ? rosuvastatin  40 mg Oral Daily  ? sodium chloride flush  3 mL Intravenous Q12H  ? sodium chloride flush  3 mL Intravenous Q12H  ? ?Infusions:  ? sodium chloride    ? ceFEPime (MAXIPIME) IV 1 g (06/05/21 1403)  ? heparin    ? ? ?Assessment: ?Pt with multiple medical issues who is s/p BKA. Heparin has been ordered for her afib.  Holding off NOAC right now because of high risk for bleeding. She has received multiple PRBCs. ? ?Hgb 7.5, plt wnl ? ? ?Goal of Therapy:  ?Heparin level 0.3-0.5 units/ml ?Monitor platelets by anticoagulation protocol: Yes ?  ?Plan:  ?Heparin infusion at 1400 units/hr ?HL in AM then daily ? ?Onnie Boer, PharmD, BCIDP, AAHIVP, CPP ?Infectious Disease Pharmacist ?06/05/2021 6:13 PM ? ? ? ?

## 2021-06-05 NOTE — Progress Notes (Signed)
?  Progress Note ? ? ? ?06/05/2021 ?7:37 AM ?3 Days Post-Op ? ?Subjective:  no complaints ? ? ?Vitals:  ? 06/04/21 1945 06/04/21 2354  ?BP: 114/69 (!) 148/82  ?Pulse: 82 84  ?Resp: 19 16  ?Temp: 98 ?F (36.7 ?C) 98 ?F (36.7 ?C)  ?SpO2: 96% 98%  ? ?Physical Exam: ?Lungs:  non labored ?Incisions:  L BKA healing well ?Neurologic: A&O ? ?CBC ?   ?Component Value Date/Time  ? WBC 11.6 (H) 06/05/2021 0210  ? RBC 2.54 (L) 06/05/2021 0210  ? HGB 7.5 (L) 06/05/2021 0210  ? HGB 9.8 (L) 03/27/2021 1113  ? HGB 8.1 (L) 09/18/2018 1227  ? HCT 24.0 (L) 06/05/2021 0210  ? HCT 39.5 06/01/2017 0959  ? PLT 259 06/05/2021 0210  ? PLT 228 03/27/2021 1113  ? PLT 238 06/01/2017 0959  ? MCV 94.5 06/05/2021 0210  ? MCV 90 06/01/2017 0959  ? MCH 29.5 06/05/2021 0210  ? MCHC 31.3 06/05/2021 0210  ? RDW 16.0 (H) 06/05/2021 0210  ? RDW 18.4 (H) 06/01/2017 0959  ? LYMPHSABS 1.1 06/05/2021 0210  ? MONOABS 0.6 06/05/2021 0210  ? EOSABS 0.3 06/05/2021 0210  ? BASOSABS 0.1 06/05/2021 0210  ? ? ?BMET ?   ?Component Value Date/Time  ? NA 133 (L) 06/05/2021 0210  ? NA 135 06/01/2017 0959  ? K 4.2 06/05/2021 0210  ? CL 96 (L) 06/05/2021 0210  ? CO2 23 06/05/2021 0210  ? GLUCOSE 155 (H) 06/05/2021 0210  ? BUN 40 (H) 06/05/2021 0210  ? BUN 27 (H) 06/01/2017 0959  ? CREATININE 6.32 (H) 06/05/2021 0210  ? CREATININE 2.96 (H) 03/27/2021 1113  ? CREATININE 1.96 (H) 07/19/2011 1201  ? CALCIUM 8.3 (L) 06/05/2021 0210  ? GFRNONAA 7 (L) 06/05/2021 0210  ? GFRNONAA 18 (L) 03/27/2021 1113  ? GFRAA 7 (L) 07/16/2019 1152  ? ? ?INR ?   ?Component Value Date/Time  ? INR 1.0 11/14/2018 1424  ? ? ? ?Intake/Output Summary (Last 24 hours) at 06/05/2021 0737 ?Last data filed at 06/05/2021 0459 ?Gross per 24 hour  ?Intake 340 ml  ?Output --  ?Net 340 ml  ? ? ? ?Assessment/Plan:  56 y.o. female is s/p L BKA 3 Days Post-Op  ? ?L BKA well appearing ?Continue daily dressing changes ?Ok for discharge to SNF when bed approved ?Call vascular on call with questions over the  weekend ? ? ?Dagoberto Ligas, PA-C ?[facility] ?[phone] ?06/05/2021 ?7:37 AM ? ? ? ?

## 2021-06-05 NOTE — Progress Notes (Signed)
?[facility] ?Progress Note  ? ?Subjective:    ?Seen and examined patient on HD. BP currently stable and tolerating UFG 3.5L. She denies SOB, CP, and N/V. ? ?Objective ?Vitals:  ? 06/05/21 0801 06/05/21 0830 06/05/21 0900 06/05/21 0930  ?BP: 126/77 (!) 135/59 (!) 152/63 140/69  ?Pulse: 82 71 79 68  ?Resp: 19 (!) 26 (!) 32 (!) 25  ?Temp:      ?TempSrc:      ?SpO2: 100% 100% 100% 100%  ?Weight:      ?Height:      ? ?Physical Exam ?General: chronically ill appearing, NAD ?Heart: RRR, no mrg ?Lungs: CTAB, nml WOB ?Abdomen: soft, NTND ?Extremities: trace LE edema, woody appearance. L BKA  ?Dialysis Access: RU AVF +b/t  ? ?Filed Weights  ? 06/04/21 0346 06/05/21 0625 06/05/21 0750  ?Weight: 112.4 kg 112.4 kg 115.8 kg  ? ? ?Intake/Output Summary (Last 24 hours) at 06/05/2021 0958 ?Last data filed at 06/05/2021 0459 ?Gross per 24 hour  ?Intake 340 ml  ?Output --  ?Net 340 ml  ? ? ?Additional Objective ?Labs: ?Basic Metabolic Panel: ?Recent Labs  ?Lab 06/01/21 ?0755 06/02/21 ?1020 06/03/21 ?0134 06/04/21 ?0203 06/05/21 ?0210  ?NA 131*   < > 130* 135 133*  ?K 5.3*   < > 4.7 4.3 4.2  ?CL 92*   < > 93* 96* 96*  ?CO2 22  --  '23 24 23  '$ ?GLUCOSE 243*   < > 308* 147* 155*  ?BUN 75*   < > 44* 25* 40*  ?CREATININE 9.08*   < > 6.49* 4.74* 6.32*  ?CALCIUM 8.6*  --  8.3* 8.4* 8.3*  ?PHOS 6.9*  --   --  4.7* 4.3  ? < > = values in this interval not displayed.  ? ?Liver Function Tests: ?Recent Labs  ?Lab 06/01/21 ?9983 06/04/21 ?0203 06/05/21 ?0210  ?AST  --  73* 42*  ?ALT  --  43 24  ?ALKPHOS  --  171* 183*  ?BILITOT  --  0.8 0.6  ?PROT  --  7.6 7.6  ?ALBUMIN 2.5* 2.7* 2.6*  ? ?No results for input(s): LIPASE, AMYLASE in the last 168 hours. ?CBC: ?Recent Labs  ?Lab 06/01/21 ?0755 06/02/21 ?1020 06/03/21 ?0134 06/03/21 ?1312 06/04/21 ?0203 06/04/21 ?1437 06/05/21 ?0210  ?WBC 17.8*  --  10.6*  --  12.9*  --  11.6*  ?NEUTROABS  --   --   --   --  10.3*  --  9.4*  ?HGB 6.4*   < > 6.1*   < > 7.6* 8.0* 7.5*  ?HCT 20.1*   < > 20.0*    < > 23.6* 25.7* 24.0*  ?MCV 97.6  --  96.2  --  94.8  --  94.5  ?PLT 239  --  247  --  238  --  259  ? < > = values in this interval not displayed.  ? ?Blood Culture ?   ?Component Value Date/Time  ? SDES BLOOD BLOOD LEFT FOREARM 06/04/2021 1840  ? SPECREQUEST  06/04/2021 1840  ?  BOTTLES DRAWN AEROBIC ONLY Blood Culture results may not be optimal due to an inadequate volume of blood received in culture bottles  ? CULT  06/04/2021 1840  ?  NO GROWTH < 24 HOURS ?Performed at [facility], [place] [address], [place], [place] [address] ?  ? REPTSTATUS PENDING 06/04/2021 1840  ? ? ?Cardiac Enzymes: ?No results for input(s): CKTOTAL, CKMB, CKMBINDEX, TROPONINI in the last 168 hours. ?  CBG: ?Recent Labs  ?Lab 06/03/21 ?2139 06/04/21 ?9702 06/04/21 ?1055 06/04/21 ?1608 06/04/21 ?2127  ?GLUCAP 150* 141* 173* 157* 162*  ? ?Iron Studies: No results for input(s): IRON, TIBC, TRANSFERRIN, FERRITIN in the last 72 hours. ?Lab Results  ?Component Value Date  ? INR 1.0 11/14/2018  ? INR 0.90 01/30/2011  ? ?Studies/Results: ?DG Chest 1 View ? ?Result Date: 06/04/2021 ?CLINICAL DATA:  Encounter for shortness of breath. EXAM: CHEST  1 VIEW COMPARISON:  Portable chest 04/26/2021. FINDINGS: The heart is moderately enlarged. There is perihilar vascular congestion and mild lower zonal interstitial consolidation consistent with edema. There are minimal pleural effusions. Patchy haziness similar to the prior study is noted in the lower lung fields and may suggest ground-glass edema, pneumonia or combination. The upper lung fields are generally clear. There is a stable mediastinal configuration with aortic atherosclerosis and tortuosity. Thoracic cage intact. IMPRESSION: 1. Cardiomegaly with mild vascular prominence. 2. Interstitial consolidation in the bases most likely due to edema, with patchy lower zonal haziness which could be ground-glass edema and/or pneumonia. 3. Minimal pleural effusions. 4. Clinical correlation and radiographic  follow-up recommended. 5. Aortic atherosclerosis. Electronically Signed   By: Telford Nab M.D.   On: 06/04/2021 02:07   ? ?Medications: ? sodium chloride    ? ceFEPime (MAXIPIME) IV 1 g (06/04/21 1019)  ? ? (feeding supplement) PROSource Plus  30 mL Oral BID BM  ? sodium chloride   Intravenous Once  ? sodium chloride   Intravenous Once  ? Chlorhexidine Gluconate Cloth  6 each Topical Q0600  ? clopidogrel  75 mg Oral Q breakfast  ? docusate sodium  100 mg Oral Daily  ? heparin  5,000 Units Subcutaneous Q8H  ? insulin aspart  0-5 Units Subcutaneous QHS  ? insulin aspart  0-6 Units Subcutaneous TID WC  ? insulin aspart protamine- aspart  35 Units Subcutaneous BID WC  ? pantoprazole  40 mg Oral Daily  ? rosuvastatin  40 mg Oral Daily  ? sodium chloride flush  3 mL Intravenous Q12H  ? sodium chloride flush  3 mL Intravenous Q12H  ? ? ?Dialysis Orders: ?MWF  - GOC ?4hrs, BFR 450, DFR 800,  EDW 110.6kg, 2K/ 2.5Ca, RU AVF,  ?-Heparin 10000 unit IV bolus TIW ? - No ESA 2/2 ?malignancy (no diagnosis - has stable renal lesion, pulm lesions -> saw onc 4/17, plan is for bronchoscopy with Bx of lung nodules in near future) ?- Calcitriol 2.5 mcg PO qHD ?- Parsabiv 7.5 mg IV qHD  ? 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  S/p balloon angioplasty and laser atherectomy to LATA 4/26, then amputation of L 4th/5th metarsals 05/28/2021 Dr. Donzetta Matters. 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  ?Volume overload:  Continue lowering volume as tolerated with HD.  ?ESRD: Continue HD per usual MWF schedule. On HD. ?Hypertension: BP variable. Metoprolol has been held.  Hypotensive with HD, can utilize midodrine and albumin with HD for BP support.  ?Anemia of ESRD: Hgb 7 on admit, s/p 1U PRBCs 4/25, 2 units pRBC 5/1, 2 units on 5/3.  Hb 7.5.  Follow trends. Not getting outpatient ESA d/t potential malignancy.   ?Secondary Hyperparathyroidism: Ca/Phos ok. Continue VDRA on MWF.   Unable to give parsabiv not on formulary.  Continue binders. ?Nutrition - Renal diet w/fluid restrictions.  ?DMT2 - per PMD ? ?Tobie Poet, NP ?[facility] ?06/05/2021,9:58 AM ? LOS: 10 days  ?  ?

## 2021-06-05 NOTE — TOC Progression Note (Signed)
Transition of Care (TOC) - Progression Note  ? ? ?Patient Details  ?Name: [name] ?MRN: [id number] ?Date of Birth: Aug 01, 1965 ? ?Transition of Care (TOC) CM/SW Contact  ?Vinie Sill, LCSW ?Phone Number: ?06/05/2021, 3:00 PM ? ?Clinical Narrative:    ? ?CSW informed patient and her sister,Patricia of bed offers- informed will need SNF choice. Family states they will call back today with choice. ? ?TOC will continue to follow and assist with discharge planning. ? ?[name], MSW, LCSW ?Clinical Social Worker ? ? ? ? ?Expected Discharge Plan: [place] ?Barriers to Discharge: Insurance Authorization (Needs SNF choice) ? ?Expected Discharge Plan and Services ?Expected Discharge Plan: [place] ?In-house Referral: Clinical Social Work ?  ?  ?Living arrangements for the past 2 months: [place] ?                ?  ?  ?  ?  ?  ?  ?  ?  ?  ?  ? ? ?Social Determinants of Health (SDOH) Interventions ?  ? ?Readmission Risk Interventions ? ?  02/28/2020  ?  1:08 PM  ?Readmission Risk Prevention Plan  ?Transportation Screening Complete  ?PCP or Specialist Appt within 5-7 Days Complete  ?Home Care Screening Complete  ?Medication Review (RN CM) Complete  ? ? ?

## 2021-06-05 NOTE — Assessment & Plan Note (Addendum)
Improved, rate now controlled ?TSH wnl ?chadvasc at least 4 ?Echo with Ef 60-65%, IV septum flattened in systole, c/w RV pressure overload, RVSF mildly reduced, severely elevated PASP -> increased RVSP from prior study, IV septum flattened in systole noted on 11/2020 echo ?Transitioned to eliquis ?Metop PO and IV ordered with holding parameters ?

## 2021-06-05 NOTE — Progress Notes (Addendum)
?PROGRESS NOTE ? ? ? ?[name]  BJS:[id number] DOB: July 30, 1965 DOA: 05/25/2021 ?PCP: Catherine Mariscal, MD  ?Chief Complaint  ?Patient presents with  ? Wound Check  ? ? 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  She had [name] chronic wound on her left fifth toe and last 3 days it has been bleeding and draining.  In the emergency room temperature 101.8, tachycardia 140, blood pressure stable.  On 4 L oxygen.  Hemoglobin 7.2.  Lactic acid 2.4.  Left foot x-ray with soft tissue swelling and osteomyelitis fifth phalanx.  Started on antibiotics, orthopedics , vascular and nephrology consulted. ?Fourth and fifth metatarsal amputation 4/27. 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  ? ? ?Assessment & Plan: ?  ?Principal Problem: ?  Osteomyelitis ([name]) ?Active Problems: ?  Hypotension ?  Atrial fibrillation with RVR ([name]) ?  Bacteremia due to Enterobacter aerogenes ?  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  Anemia in chronic kidney disease ?  Asterixis ?  Acute metabolic encephalopathy ?  Hypertension associated with diabetes ([name]) ?  Hyperlipidemia associated with type 2 diabetes mellitus ([name]) ?  Rheumatoid arthritis ([name]) ?  ESRD (end stage renal disease) ([name]) ?  Type 2 diabetes mellitus with diabetic chronic kidney disease ([name]) ?  Diastolic dysfunction ?  Chronic pain disorder ?  Coronary artery disease ?  Obesity (BMI 30-39.9) ?  Sepsis ([name]) ? ?Assessment and Plan: ?Atrial fibrillation with RVR ([name]) ?New onset, in the setting of hypotension on [name] dialysis day ?TSH wnl ?chadvasc at least 4 ?Echo with Ef 60-65%, RVSF mildly reduced, severely elevated PASP -> increased RVSP from prior study ?Will start heparin gtt (need to watch Hb closely with  need for transfusion previously, no bolus) ?HR's currently in the 100's to low 110's ?Metop PO and IV ordered with holding parameters ?If unable to get metop due to BP, may need to go with amiodarone ? ?Addendum: BP now improved, give oral metop, follow rates.  IV metop with holding parameters ordered.  Follow HR. ? ?Hypotension ?After dialysis ?Give midodrine and small bolus ?Midodrine, albumin during dialysis ?BP soft today after dialysis, also new RVR, she's asymptomatic now, follow with midodrine + small bolus ? ?Bacteremia due to Enterobacter aerogenes ?Likely due to osteomyelitis to L foot ?Now s/p amputation ?1/2 sets with enterobacter aerogenes from 4/24 (aerobic bottle only) ?1/2 sets with staph epidermidis (aerobic/anaerobic) - thought contaminant  ?Repeat cx 4/28 and 4/29 no growth  ?Recurrent fever 5/3 PM -> follow repeat cultures (no growth), CXR with consolidation in bases edema with patchy lower zonal haziness (ground glass edema and/or pneumonia) -> repeat CXR 5/6 (pending) ?Enterobacter aerogenes resistant to ancef  ?Cefepime 4/28 - present ?Plain films from 4/24 with evidence of soft tissue infection/osteomyelitis  ?S/p amputation L 4th and 5th toes on 4/27 ?Now s/p L BKA on 5/2  ? ? ? 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 ? ?Anemia in chronic kidney disease ?S/p 5 unit pRBC  ?Follow post transfusion Hb ?Repeat anemia labs  ? ?Acute metabolic encephalopathy ?Related to  opiates, s/p narcan ?Opiates d/c'd ?She's improved today, still some myoclonic jerking and asterixis - though overall improved, continue to hold opiates ? ?Asterixis ?Suspect related to opiates in renal dz  ?Continue to hold ? ?Hypertension associated with diabetes ([place]) ?Metop with holding parameters ? ?Hyperlipidemia associated with type 2 diabetes mellitus ([place]) ?rosuvastatin ? ?Rheumatoid arthritis ([place]) ?humira on hold ? ?ESRD (end stage renal disease) ([place]) ?Renal consulted ? ? ?Type 2 diabetes mellitus with diabetic chronic kidney disease ([place]) ?SSI, 70/30 ? ? ?DVT prophylaxis: SCD ?Code Status: full ?Family Communication: none ?Disposition:  ? ?Status is: Inpatient ?Remains inpatient appropriate because: need for continued abx, continued vascular eval ?  ?Consultants:  ?Vascular ?renal ? ?Procedures:  ?4/26 ?Procedure Performed: ?1.  Ultrasound-guided micropuncture access of the right common femoral artery ?2.  Aortogram ?3.  Second-order cannulation, left lower extremity angiogram ?4.  Laser atherectomy of the anterior tibial artery ?5.  Balloon angioplasty of the anterior tibial artery ?6.  Device assisted closure-Mynx ? ?4/27 ?Amputation of the left fourth and fifth toes including metatarsal bones and sharp excisional debridement left foot of skin and soft tissue measuring 12 x 7 cm ?  ?5/2 ?Left below-knee amputation ? ?Antimicrobials:  ?Anti-infectives (From admission, onward)  ? ? Start     Dose/Rate Route Frequency Ordered Stop  ? 05/29/21 0845  ceFEPIme (MAXIPIME) 2 g in sodium chloride 0.9 % 100 mL IVPB       ? 2 g ?200 mL/hr over 30 Minutes Intravenous  Once 05/29/21 0836 05/29/21 1220  ? 05/27/21 1200  vancomycin (VANCOCIN) IVPB 1000 mg/200 mL premix  Status:  Discontinued       ? 1,000 mg ?200 mL/hr over 60 Minutes Intravenous Every M-W-F (Hemodialysis) 05/27/21 0844 05/31/21 0724  ? 05/26/21 2200  ceFEPIme (MAXIPIME) 1 g in sodium chloride 0.9 % 100 mL IVPB       ? 1 g ?200 mL/hr over 30 Minutes  Intravenous Every 24 hours 05/25/21 2255    ? 05/25/21 2200  ceFEPIme (MAXIPIME) 2 g in sodium chloride 0.9 % 100 mL IVPB       ? 2 g ?200 mL/hr over 30 Minutes Intravenous  Once 05/25/21 2123 05/25/21 2229  ? 05/25/21 2100  vancomycin (VANCOREADY) IVPB 2000 mg/400 mL       ? 2,000 mg ?200 mL/hr over 120 Minutes Intravenous  Once 05/25/21 2012 05/26/21 0041  ? ?  ? ? ?Subjective: ?Denies CP, notes palpitations ? ?Objective: ?Vitals:  ? 06/05/21 1551 06/05/21 1644 06/05/21 1725 06/05/21 1755  ?BP: (!) 84/56 (!) 90/54 (!) 90/48 (!) 89/52  ?Pulse: 65 (!) 120 (!) 15 (!) 119  ?Resp: '20 20 15 19  '$ ?Temp:      ?TempSrc:      ?SpO2:      ?Weight:      ?Height:      ? ? ?Intake/Output Summary (Last 24 hours) at 06/05/2021 1850 ?Last data filed at 06/05/2021 1223 ?Gross per 24 hour  ?Intake 340 ml  ?Output 3000 ml  ?Net -2660 ml  ? ?Filed Weights  ? 06/05/21 0625 06/05/21 0750 06/05/21 1223  ?Weight: 112.4 kg 115.8 kg 112.6 kg  ? ? ?Examination: ? ?General: No acute distress. ?Cardiovascular: irregualrly irregular, tachy ?Lungs: unlabored ?Neurological: Alert  and oriented ?3. Moves all extremities ?4.  Improved asterixis/myoclonic jerks, still mildly present. Cranial nerves II through XII grossly intact. ?Extremities: L BKA with intact dressing ? ? ? ?Data Reviewed: I have personally reviewed following labs and imaging studies ? ?CBC: ?Recent Labs  ?Lab 06/01/21 ?0755 06/02/21 ?1020 06/03/21 ?0134 06/03/21 ?1312 06/04/21 ?0203 06/04/21 ?1437 06/05/21 ?0210 06/05/21 ?1640  ?WBC 17.8*  --  10.6*  --  12.9*  --  11.6*  --   ?NEUTROABS  --   --   --   --  10.3*  --  9.4*  --   ?HGB 6.4*   < > 6.1* 7.4* 7.6* 8.0* 7.5* 8.0*  ?HCT 20.1*   < > 20.0* 22.6* 23.6* 25.7* 24.0* 24.9*  ?MCV 97.6  --  96.2  --  94.8  --  94.5  --   ?PLT 239  --  247  --  238  --  259  --   ? < > = values in this interval not displayed.  ? ? ?Basic Metabolic Panel: ?Recent Labs  ?Lab 05/31/21 ?0929 06/01/21 ?0086 06/02/21 ?1020 06/02/21 ?1243 06/03/21 ?0134  06/04/21 ?0203 06/05/21 ?0210  ?NA 132* 131* 130* 133* 130* 135 133*  ?K 5.2* 5.3* 4.8 4.5 4.7 4.3 4.2  ?CL 94* 92* 95* 94* 93* 96* 96*  ?CO2 22 22  --   --  '23 24 23  '$ ?GLUCOSE 212* 243* 261* 223* 308* 147* 1

## 2021-06-06 ENCOUNTER — Inpatient Hospital Stay (HOSPITAL_COMMUNITY): Payer: Medicare Other

## 2021-06-06 DIAGNOSIS — M86272 Subacute osteomyelitis, left ankle and foot: Secondary | ICD-10-CM | POA: Diagnosis not present

## 2021-06-06 LAB — CBC WITH DIFFERENTIAL/PLATELET
Abs Immature Granulocytes: 0.28 10*3/uL — ABNORMAL HIGH (ref 0.00–0.07)
Basophils Absolute: 0 10*3/uL (ref 0.0–0.1)
Basophils Relative: 0 %
Eosinophils Absolute: 0.2 10*3/uL (ref 0.0–0.5)
Eosinophils Relative: 2 %
HCT: 24.6 % — ABNORMAL LOW (ref 36.0–46.0)
Hemoglobin: 7.6 g/dL — ABNORMAL LOW (ref 12.0–15.0)
Immature Granulocytes: 3 %
Lymphocytes Relative: 12 %
Lymphs Abs: 1.2 10*3/uL (ref 0.7–4.0)
MCH: 29.3 pg (ref 26.0–34.0)
MCHC: 30.9 g/dL (ref 30.0–36.0)
MCV: 95 fL (ref 80.0–100.0)
Monocytes Absolute: 0.6 10*3/uL (ref 0.1–1.0)
Monocytes Relative: 6 %
Neutro Abs: 7.5 10*3/uL (ref 1.7–7.7)
Neutrophils Relative %: 77 %
Platelets: 260 10*3/uL (ref 150–400)
RBC: 2.59 MIL/uL — ABNORMAL LOW (ref 3.87–5.11)
RDW: 15.8 % — ABNORMAL HIGH (ref 11.5–15.5)
WBC: 9.8 10*3/uL (ref 4.0–10.5)
nRBC: 1.1 % — ABNORMAL HIGH (ref 0.0–0.2)

## 2021-06-06 LAB — PHOSPHORUS: Phosphorus: 3.2 mg/dL (ref 2.5–4.6)

## 2021-06-06 LAB — COMPREHENSIVE METABOLIC PANEL
ALT: 36 U/L (ref 0–44)
AST: 76 U/L — ABNORMAL HIGH (ref 15–41)
Albumin: 2.6 g/dL — ABNORMAL LOW (ref 3.5–5.0)
Alkaline Phosphatase: 178 U/L — ABNORMAL HIGH (ref 38–126)
Anion gap: 14 (ref 5–15)
BUN: 29 mg/dL — ABNORMAL HIGH (ref 6–20)
CO2: 25 mmol/L (ref 22–32)
Calcium: 8.4 mg/dL — ABNORMAL LOW (ref 8.9–10.3)
Chloride: 96 mmol/L — ABNORMAL LOW (ref 98–111)
Creatinine, Ser: 4.67 mg/dL — ABNORMAL HIGH (ref 0.44–1.00)
GFR, Estimated: 10 mL/min — ABNORMAL LOW (ref >60)
Glucose, Bld: 95 mg/dL (ref 70–99)
Potassium: 4.1 mmol/L (ref 3.5–5.1)
Sodium: 135 mmol/L (ref 135–145)
Total Bilirubin: 0.6 mg/dL (ref 0.3–1.2)
Total Protein: 7.7 g/dL (ref 6.5–8.1)

## 2021-06-06 LAB — IRON AND TIBC
Iron: 40 ug/dL (ref 28–170)
Saturation Ratios: 23 % (ref 10.4–31.8)
TIBC: 174 ug/dL — ABNORMAL LOW (ref 250–450)
UIBC: 134 ug/dL

## 2021-06-06 LAB — MAGNESIUM: Magnesium: 1.9 mg/dL (ref 1.7–2.4)

## 2021-06-06 LAB — GLUCOSE, CAPILLARY
Glucose-Capillary: 95 mg/dL (ref 70–99)
Glucose-Capillary: 163 mg/dL — ABNORMAL HIGH (ref 70–99)
Glucose-Capillary: 217 mg/dL — ABNORMAL HIGH (ref 70–99)
Glucose-Capillary: 175 mg/dL — ABNORMAL HIGH (ref 70–99)

## 2021-06-06 LAB — FOLATE: Folate: 11.6 ng/mL (ref >5.9)

## 2021-06-06 LAB — HEPARIN LEVEL (UNFRACTIONATED)
Heparin Unfractionated: <0.1 IU/mL — ABNORMAL LOW (ref 0.30–0.70)
Heparin Unfractionated: <0.1 IU/mL — ABNORMAL LOW (ref 0.30–0.70)

## 2021-06-06 LAB — VITAMIN B12: Vitamin B-12: 1657 pg/mL — ABNORMAL HIGH (ref 180–914)

## 2021-06-06 LAB — FERRITIN: Ferritin: >7500 ng/mL — ABNORMAL HIGH (ref 11–307)

## 2021-06-06 MED ORDER — INSULIN ASPART PROT & ASPART (70-30 MIX) 100 UNIT/ML ~~LOC~~ SUSP
28.0000 [IU] | Freq: Two times a day (BID) | SUBCUTANEOUS | Status: DC
  Administered 2021-06-06 – 2021-06-11 (×7): 28 [IU] via SUBCUTANEOUS

## 2021-06-06 MED ORDER — TRAZODONE HCL 50 MG PO TABS
50.0000 mg | ORAL_TABLET | Freq: Every day | ORAL | Status: DC
  Administered 2021-06-06: 50 mg via ORAL
  Filled 2021-06-06: qty 1

## 2021-06-06 NOTE — Progress Notes (Signed)
HOSPITAL MEDICINE OVERNIGHT EVENT NOTE   ? ?According to nursing, since patient's development of rapid atrial fibrillation yesterday afternoon patient ha achieved improved rate control. ? ?Patient is currently back in sinus tachycardia with heart rates just over 100 bpm.  Patient has continued to exhibit bouts of hypotension however which resulted in nursing holding the patient's evening dose of oral metoprolol due to systolic blood pressure in the 90s. ? ?If patient converts back into rapid atrial fibrillation we will likely either initiate amiodarone or digoxin depending on the degree of associated hypotension.  Continue to monitor closely on telemetry. ? ?Catherine Emerald  MD ?[facility]  ? ? ? ? ? ? ? ? ? ? ? ?

## 2021-06-06 NOTE — Progress Notes (Signed)
Physical Therapy Treatment ?Patient Details ?Name: [name] ?MRN: [id number] ?DOB: Jan 11, 1966 ?Today's Date: 06/06/2021 ? ? ?History of Present Illness 56 y.o. female presented 05/25/21 with infected L 5th toe. S/p L leg aortogram, angiogram, laser atherectomy of the anterior tibial artery, and balloon angioplasty of anterior tibial artery 4/26. 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 PMH: hypertension, diabetes, hyperlipidemia, CHF, ESRD on HD, chronic pain, CAD, anemia, Buerger disease, rheumatoid arthritis ? ?  ?PT Comments  ? ? Pt very motivated to get up in recliner, RN assisted with mobility. Attempted to utilize Stedy,however pt unable to clear hips from bed with assist x2. Pt returned to supine and was able to perform lateral scoot with sliding board with min A. D/c plan remains appropriate. PT will continue to follow acutely.  ?   ?Recommendations for follow up therapy are one component of a multi-disciplinary discharge planning process, led by the attending physician.  Recommendations may be updated based on patient status, additional functional criteria and insurance authorization. ? ?Follow Up Recommendations ? Skilled nursing-short term rehab (<3 hours/day) (would recommend AIR but insurance won't approve) ?  ?  ?Assistance Recommended at Discharge Frequent or constant Supervision/Assistance  ?Patient can return home with the following Two people to help with walking and/or transfers;A lot of help with bathing/dressing/bathroom;Assistance with cooking/housework;Assist for transportation;Help with stairs or ramp for entrance ?  ?Equipment Recommendations ? BSC/3in1;Rolling walker (2 wheels) (drop-arm bedside commode; current RW is broken)  ?  ?   ?Precautions / Restrictions Precautions ?Precautions: Fall ?Precaution Comments: new L BKA 06/02/21 (knee immobilizer on) ?Required Braces or Orthoses: Other Brace (limb protector) ?Restrictions ?Weight Bearing Restrictions: Yes ?LLE  Weight Bearing: Non weight bearing  ?  ? ?Mobility ? Bed Mobility ?Overal bed mobility: Modified Independent ?  ?  ?  ?  ?  ?  ?General bed mobility comments: Pt sitting EOB upon arrival able to return to supine with use of bed rails ?  ? ?Transfers ?Overall transfer level: Needs assistance ?Equipment used: Ambulation equipment used, Sliding board ?Transfers: Sit to/from Stand, Bed to chair/wheelchair/BSC ?Sit to Stand: Max assist, From elevated surface, +2 physical assistance, +2 safety/equipment ?  ?  ?  ?  ? Lateral/Scoot Transfers: With slide board, From elevated surface, Min assist ?General transfer comment: attempted to utilize Stedy to transfer to recliner, pt lacking strength to pull herself into upright, attempted x2, returned to bed and HoB elevated, and matched to drop arm recliner on pt R. able to lean to L for placement of sliding board under R hip and pad, utilized pad for reduced friction, with PT management of pad pt able to perform lateral scoot with min A, maximal cuing for sequencing ?  ? ?Ambulation/Gait ?  ?  ?  ?  ?  ?  ?  ?General Gait Details: deferred ? ? ?  ? ? ?  ?Balance Overall balance assessment: Needs assistance ?Sitting-balance support: Bilateral upper extremity supported, Feet supported, No upper extremity supported ?Sitting balance-Leahy Scale: Fair ?Sitting balance - Comments: Able to sit statically without UE support or LOB but tends to prefer UE support ?  ?Standing balance support: Bilateral upper extremity supported ?Standing balance-Leahy Scale: Zero ?  ?  ?  ?  ?  ?  ?  ?  ?  ?  ?  ?  ?  ? ?  ?Cognition Arousal/Alertness: Awake/alert ?Behavior During Therapy: [facility] for tasks assessed/performed ?Overall Cognitive Status: Within Functional Limits for  tasks assessed ?  ?  ?  ?  ?  ?  ?  ?  ?  ?  ?  ?  ?  ?  ?  ?  ?  ?  ?  ? ?  ?Exercises Amputee Exercises ?Knee Extension: AROM, Left, 10 reps, Seated ?Other Exercises ?Other Exercises: hip internal rotation x10 bilaterally ?Other  Exercises: crunches in chair to sit with back unsupported and no use of UE x 10 ? ?  ?General Comments  VSS on RA ?  ?  ? ?Pertinent Vitals/Pain Pain Assessment ?Pain Assessment: Faces ?Faces Pain Scale: Hurts little more ?Pain Location: L residual limb ?Pain Descriptors / Indicators: Discomfort, Grimacing, Operative site guarding ?Pain Intervention(s): Limited activity within patient's tolerance, Monitored during session, Repositioned  ? ? ? ?PT Goals (current goals can now be found in the care plan section) Acute Rehab PT Goals ?Patient Stated Goal: to get better ?PT Goal Formulation: With patient ?Time For Goal Achievement: 06/18/21 ?Potential to Achieve Goals: Good ?Progress towards PT goals: Progressing toward goals ? ?  ?Frequency ? ? ? Min 3X/week ? ? ? ?  ?PT Plan Current plan remains appropriate  ? ? ?   ?AM-PAC PT "6 Clicks" Mobility   ?Outcome Measure ? Help needed turning from your back to your side while in a flat bed without using bedrails?: A Little ?Help needed moving from lying on your back to sitting on the side of a flat bed without using bedrails?: A Little ?Help needed moving to and from a bed to a chair (including a wheelchair)?: A Lot ?Help needed standing up from a chair using your arms (e.g., wheelchair or bedside chair)?: A Lot ?Help needed to walk in hospital room?: Total ?Help needed climbing 3-5 steps with a railing? : Total ?6 Click Score: 12 ? ?  ?End of Session Equipment Utilized During Treatment: Gait belt ?Activity Tolerance: Patient tolerated treatment well ?Patient left: in bed;with call bell/phone within reach;with bed alarm set ?  ?PT Visit Diagnosis: Unsteadiness on feet (R26.81);Muscle weakness (generalized) (M62.81);History of falling (Z91.81);Difficulty in walking, not elsewhere classified (R26.2);Pain ?Pain - Right/Left: Left ?Pain - part of body: Leg ?  ? ? ?Time: 5035-4656 ?PT Time Calculation (min) (ACUTE ONLY): 39 min ? ?Charges:  $Therapeutic Exercise: 8-22  mins ?$Therapeutic Activity: 8-22 mins          ?          ? ?Peggie Hornak B. Migdalia Dk PT, DPT ?Acute Rehabilitation Services ?Please use secure chat or  ?Call Office [phone] ? ? ? ?Bailey Mech Fleet ?06/06/2021, 5:40 PM ? ?

## 2021-06-06 NOTE — Progress Notes (Signed)
ANTICOAGULATION CONSULT NOTE ? ?Pharmacy Consult for heparin ?Indication: atrial fibrillation ? ?Allergies  ?Allergen Reactions  ? Bupropion Itching  ? Dilaudid [Hydromorphone] Other (See Comments)  ?  Apnea, required intubation  ? ? ?Patient Measurements: ?Height: 5' 8"  (172.7 cm) ?Weight: 110.6 kg (243 lb 13.3 oz) ?IBW/kg (Calculated) : 63.9 ?Heparin Dosing Weight: 93kg ? ?Vital Signs: ?Temp: 97.8 ?F (36.6 ?C) (05/06 1657) ?Temp Source: Oral (05/06 1657) ?BP: 102/84 (05/06 1657) ?Pulse Rate: 88 (05/06 1657) ? ?Labs: ?Recent Labs  ?  06/04/21 ?0203 06/04/21 ?1437 06/05/21 ?0210 06/05/21 ?1640 06/06/21 ?2947 06/06/21 ?1609  ?HGB 7.6*   < > 7.5* 8.0* 7.6*  --   ?HCT 23.6*   < > 24.0* 24.9* 24.6*  --   ?PLT 238  --  259  --  260  --   ?HEPARINUNFRC  --   --   --   --  <0.10* <0.10*  ?CREATININE 4.74*  --  6.32*  --  4.67*  --   ? < > = values in this interval not displayed.  ? ? ? ?Estimated Creatinine Clearance: 17.5 mL/min (A) (by C-G formula based on SCr of 4.67 mg/dL (H)). ? ? ?Medical History: ?Past Medical History:  ?Diagnosis Date  ? Anaphylactic shock, unspecified, initial encounter 09/04/2018  ? Anemia   ? Anxiety   ? CAD (coronary artery disease)   ? Nonobstructive on CT 2019  ? CHF (congestive heart failure) ([place])   ? Chronic bilateral pleural effusions 10/23/2018  ? COVID-19 10/2018  ? COVID-19 virus detected 10/10/2018  ? COVID-19 virus infection 09/18/2018  ? ESRD (end stage renal disease) ([place])   ? Dialysis TTHSat- 3[address]  ? Gangrene ([place]) 09/18/2018  ? Headache(784.0)   ? High cholesterol   ? History of blood transfusion   ? Hypertension   ? Mitral regurgitation   ? moderate to severe MR 10/2018 echo  ? Nerve pain   ? "they say I have L5 nerve damage; my lumbar"  ? Neuropathy 01/29/2011  ? 04/2011 MRI L-spine:  L5-S1: Bulge/shallow broad-based protrusion greatest centrally and in the right posterior lateral position. Minimal crowding of the  upper aspect of the S1 nerve root greater on the right. Left  lateral disc osteophyte with mild encroachment upon but not  significant compression of the exiting left L5 nerve root.   05/2011: Dr. Sherwood Gambler ([facility]): DJD and mild disc bu  ? Pericardial effusion   ? Pneumonia   ? ; 11/16/2018- "touch of pneumonia" - seen in ED- 11/14/2018- on antibiotic. Has had it x 2  ? Pneumonia 10/10/2018  ? PVD (peripheral vascular disease) ([place])   ? Right leg stent in [place].  (No records)  ? Restless legs   ? Rheumatoid arthritis ([place])   ? "knees" "hands", "RA"  ? Sleep apnea   ? does not use Cpap  ? Thoracic ascending aortic aneurysm ([place])   ? 4.4 cm 11/14/18 CTA  ? Type II diabetes mellitus ([place])   ? Uterine leiomyoma 01/10/2021  ? ? ?Medications:  ?Medications Prior to Admission  ?Medication Sig Dispense Refill Last Dose  ? acetaminophen (TYLENOL) 500 MG tablet Take 500-1,000 mg by mouth daily as needed for headache (pain).   Past Week  ? Ascorbic Acid (VITAMIN C PO) Take 1 tablet by mouth See admin instructions. At dialysis Monday,Wednesday and friday   Past Week  ? AURYXIA 1 GM 210 MG(Fe) tablet Take 420-840 mg by mouth See admin instructions. 840 mg twice daily with  meals, occasionally take 420 mg with a snack   Past Week  ? b complex-vitamin c-folic acid (NEPHRO-VITE) 0.8 MG TABS tablet Take 1 tablet by mouth every Monday, Wednesday, and Friday with hemodialysis.   Past Week  ? HUMIRA PEN 40 MG/0.4ML PNKT Inject 40 mg into the skin every 14 (fourteen) days.   Past Week  ? hydrOXYzine (ATARAX/VISTARIL) 25 MG tablet Take 25 mg by mouth every morning.   Past Week  ? Insulin Lispro Prot & Lispro (HUMALOG 75/25 MIX) (75-25) 100 UNIT/ML Kwikpen Inject 35 Units into the skin 2 (two) times daily with a meal. (Patient taking differently: Inject 35 Units into the skin in the morning and at bedtime.) 45 mL 3 Past Week  ? Methoxy PEG-Epoetin Beta (MIRCERA IJ) Dialysis Monday,Wednesday and friday   Past Week  ? metoprolol succinate (TOPROL-XL) 100 MG 24 hr tablet Take 100 mg by  mouth daily.   Past Week at unk  ? Multiple Vitamins-Minerals (ZINC PO) Take 1 tablet by mouth every morning.   Past Week  ? OVER THE COUNTER MEDICATION Take 1 capsule by mouth every morning. Omega XL   Past Month  ? oxyCODONE-acetaminophen (PERCOCET) 10-325 MG tablet Take 1 tablet by mouth 3 (three) times daily as needed for pain.   Past Week  ? pramipexole (MIRAPEX) 0.5 MG tablet Take 0.5 mg by mouth daily.   1 Past Week  ? rosuvastatin (CRESTOR) 40 MG tablet TAKE 1 TABLET BY MOUTH DAILY *PATIENT NEEDS APPOINTMENT* (Patient taking differently: Take 40 mg by mouth daily.) 30 tablet 10 Past Week  ? albuterol (VENTOLIN HFA) 108 (90 Base) MCG/ACT inhaler Inhale 2 puffs into the lungs every 6 (six) hours as needed for wheezing or shortness of breath. (Patient not taking: Reported on 05/27/2021) 6.7 g 0 Not Taking  ? glucose blood (ONETOUCH VERIO) test strip 1 each by Other route 2 (two) times daily. And lancets 2/day 200 each 3   ? NARCAN 4 MG/0.1ML LIQD nasal spray kit Place 1 spray into the nose as needed (accidental overdose.).  (Patient not taking: Reported on 05/27/2021)   Not Taking  ? ?Scheduled:  ? (feeding supplement) PROSource Plus  30 mL Oral BID BM  ? sodium chloride   Intravenous Once  ? sodium chloride   Intravenous Once  ? Chlorhexidine Gluconate Cloth  6 each Topical Q0600  ? clopidogrel  75 mg Oral Q breakfast  ? docusate sodium  100 mg Oral Daily  ? insulin aspart  0-5 Units Subcutaneous QHS  ? insulin aspart  0-6 Units Subcutaneous TID WC  ? insulin aspart protamine- aspart  28 Units Subcutaneous BID WC  ? metoprolol tartrate  12.5 mg Oral BID  ? pantoprazole  40 mg Oral Daily  ? rosuvastatin  40 mg Oral Daily  ? sodium chloride flush  3 mL Intravenous Q12H  ? sodium chloride flush  3 mL Intravenous Q12H  ? ?Infusions:  ? sodium chloride    ? ceFEPime (MAXIPIME) IV 1 g (06/06/21 0926)  ? heparin 1,700 Units/hr (06/06/21 0920)  ? ? ?Assessment: ?Pt with multiple medical issues who is s/p BKA. Heparin  has been ordered for her afib. Holding off DOAC right now because of high risk for bleeding. She has received multiple PRBCs.  ? ?Heparin level remains <0.1, no infusion issues per nursing. Mild bleeding where lab was drawn. ? ?Goal of Therapy:  ?Heparin level 0.3-0.5 units/ml ?Monitor platelets by anticoagulation protocol: Yes ?  ?Plan:  ?Increase heparin to  1950 units/h ?Recheck heparin level in 8h ? ?Arrie Senate, PharmD, BCPS, BCCP ?Clinical Pharmacist ?[phone] ?Please check AMION for all [place] numbers ?06/06/2021 ? ? ? ?

## 2021-06-06 NOTE — Progress Notes (Addendum)
?PROGRESS NOTE ? ? ? ?[name]  ZOX:[id number] DOB: Jan 10, 1966 DOA: 05/25/2021 ?PCP: Sandi Mariscal, MD  ?Chief Complaint  ?Patient presents with  ? Wound Check  ? ? 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  She had Kinley Ferrentino chronic wound on her left fifth toe and last 3 days it has been bleeding and draining.  In the emergency room temperature 101.8, tachycardia 140, blood pressure stable.  On 4 L oxygen.  Hemoglobin 7.2.  Lactic acid 2.4.  Left foot x-ray with soft tissue swelling and osteomyelitis fifth phalanx.  Started on antibiotics, orthopedics , vascular and nephrology consulted. ?Fourth and fifth metatarsal amputation 4/27. 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  ? ? ?Assessment & Plan: ?  ?Principal Problem: ?  Osteomyelitis ([place]) ?Active Problems: ?  Hypotension ?  Atrial fibrillation with RVR ([place]) ?  Bacteremia due to Enterobacter aerogenes ?  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  Anemia in chronic kidney disease ?  Asterixis ?  Acute metabolic encephalopathy ?  Hypertension associated with diabetes ([place]) ?  Hyperlipidemia associated with type 2 diabetes mellitus ([place]) ?  Rheumatoid arthritis ([place]) ?  ESRD (end stage renal disease) ([place]) ?  Type 2 diabetes mellitus with diabetic chronic kidney disease ([place]) ?  Diastolic dysfunction ?  Chronic pain disorder ?  Coronary artery disease ?  Obesity (BMI 30-39.9) ?  Sepsis ([place]) ? ?Assessment and Plan: ?Atrial fibrillation with RVR ([place]) ?Improved, now in sinus, rate controlled ?TSH wnl ?chadvasc at least 4 ?Echo with Ef 60-65%, IV septum flattened in systole, c/w RV pressure overload, RVSF mildly reduced, severely elevated PASP -> increased RVSP from prior study, IV septum  flattened in systole noted on 11/2020 echo ?Continue heparin gtt (need to watch Hb closely with need for transfusion previously, no bolus) ?Metop PO and IV ordered with holding parameters ?If unable to get metop due to BP, may need to go with amiodarone ? ?Hypotension ?Improved today (5/5 needed bolus and midodrine), will follow ?Midodrine, albumin during dialysis ? ?Bacteremia due to Enterobacter aerogenes ?Likely due to osteomyelitis to L foot ?Now s/p amputation ?1/2 sets with enterobacter aerogenes from 4/24 (aerobic bottle only) ?1/2 sets with staph epidermidis (aerobic/anaerobic) - thought contaminant  ?Repeat cx 4/28 and 4/29 no growth  ?Recurrent fever 5/3 PM -> follow repeat cultures (no growth), CXR with consolidation in bases edema with patchy lower zonal haziness (ground glass edema and/or pneumonia) -> repeat CXR 5/6 (pending) ?Enterobacter aerogenes resistant to ancef  ?Cefepime 4/28 - present ?Plain films from 4/24 with evidence of soft tissue infection/osteomyelitis  ?S/p amputation L 4th and 5th toes on 4/27 ?Now s/p L BKA on 5/2  ? ? ? 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 ? ?Anemia in chronic kidney disease ?S/p 5 unit pRBC  ?Follow post transfusion Hb ?Repeat anemia  labs - AOCD ? ?Acute metabolic encephalopathy ?Related to opiates, s/p narcan ?Opiates d/c'd ?She's improved today overall - but Sora Olivo little more sleepy today than yesterday, still some myoclonic jerking and  asterixis - though overall improved, continue to hold opiates (of note, apparently RN heard her asking someone on phone to bring her pain meds, will continue to monitor - she's not asking me for anything more than Apap today) ? ?Asterixis ?Suspect related to opiates in renal dz  ?Continue to hold ? ?Hypertension associated with diabetes ([place]) ?Metop with holding parameters ? ?Hyperlipidemia associated with type 2 diabetes mellitus ([place]) ?rosuvastatin ? ?Rheumatoid arthritis ([place]) ?humira on hold ? ?ESRD (end stage renal disease) ([place]) ?Renal consulted ? ? ?Type 2 diabetes mellitus with diabetic chronic kidney disease ([place]) ?SSI, 70/30 ? ? ?DVT prophylaxis: SCD ?Code Status: full ?Family Communication: none ?Disposition:  ? ?Status is: Inpatient ?Remains inpatient appropriate because: need for continued abx, continued vascular eval ?  ?Consultants:  ?Vascular ?renal ? ?Procedures:  ?4/26 ?Procedure Performed: ?1.  Ultrasound-guided micropuncture access of the right common femoral artery ?2.  Aortogram ?3.  Second-order cannulation, left lower extremity angiogram ?4.  Laser atherectomy of the anterior tibial artery ?5.  Balloon angioplasty of the anterior tibial artery ?6.  Device assisted closure-Mynx ? ?4/27 ?Amputation of the left fourth and fifth toes including metatarsal bones and sharp excisional debridement left foot of skin and soft tissue measuring 12 x 7 cm ?  ?5/2 ?Left below-knee amputation ? ?Antimicrobials:  ?Anti-infectives (From admission, onward)  ? ? Start     Dose/Rate Route Frequency Ordered Stop  ? 05/29/21 0845  ceFEPIme (MAXIPIME) 2 g in sodium chloride 0.9 % 100 mL IVPB       ? 2 g ?200 mL/hr over 30 Minutes Intravenous  Once 05/29/21 0836 05/29/21 1220  ? 05/27/21 1200  vancomycin (VANCOCIN) IVPB 1000 mg/200 mL premix  Status:  Discontinued       ? 1,000 mg ?200 mL/hr over 60 Minutes Intravenous Every M-W-F (Hemodialysis) 05/27/21 0844 05/31/21 0724  ? 05/26/21 2200  ceFEPIme (MAXIPIME) 1 g in  sodium chloride 0.9 % 100 mL IVPB       ? 1 g ?200 mL/hr over 30 Minutes Intravenous Every 24 hours 05/25/21 2255    ? 05/25/21 2200  ceFEPIme (MAXIPIME) 2 g in sodium chloride 0.9 % 100 mL IVPB       ? 2 g ?200 mL/hr over 30 Minutes Intravenous  Once 05/25/21 2123 05/25/21 2229  ? 05/25/21 2100  vancomycin (VANCOREADY) IVPB 2000 mg/400 mL       ? 2,000 mg ?200 mL/hr over 120 Minutes Intravenous  Once 05/25/21 2012 05/26/21 0041  ? ?  ? ? ?Subjective: ?Asks for APAP for pain ? ?Objective: ?Vitals:  ? 06/06/21 0556 06/06/21 0626 06/06/21 0744 06/06/21 1657  ?BP: 113/64 113/68 105/60 102/84  ?Pulse: (!) 128 (!) 128 (!) 129 88  ?Resp: '17 17 16 '$ (!) 30  ?Temp: 98.2 ?F (36.8 ?C) 98.2 ?F (36.8 ?C) 98.2 ?F (36.8 ?C) 97.8 ?F (36.6 ?C)  ?TempSrc:   Oral Oral  ?SpO2:   97% 93%  ?Weight: 110.6 kg     ?Height:      ? ?No intake or output data in the 24 hours ending 06/06/21 1703 ? ?Filed Weights  ? 06/05/21 0750 06/05/21 1223 06/06/21 0556  ?Weight: 115.8 kg 112.6 kg 110.6 kg  ? ? ?Examination: ? ?General: No acute distress. ?Cardiovascular: RRR ?Lungs: unlabored ?Abdomen: Soft, nontender,  nondistended  ?Neurological: Alphonsine Minium bit sleepy, closes eyes during our discussion, but answers questions appropriately - mild asterixis, myoclonic jerking ?Extremities: L BKA ? ? ?Data Reviewed: I have personally reviewed following labs and imaging studies ? ?CBC: ?Recent Labs  ?Lab 06/01/21 ?0755 06/02/21 ?1020 06/03/21 ?0134 06/03/21 ?1312 06/04/21 ?0203 06/04/21 ?1437 06/05/21 ?0210 06/05/21 ?1640 06/06/21 ?3005  ?WBC 17.8*  --  10.6*  --  12.9*  --  11.6*  --  9.8  ?NEUTROABS  --   --   --   --  10.3*  --  9.4*  --  7.5  ?HGB 6.4*   < > 6.1*   < > 7.6* 8.0* 7.5* 8.0* 7.6*  ?HCT 20.1*   < > 20.0*   < > 23.6* 25.7* 24.0* 24.9* 24.6*  ?MCV 97.6  --  96.2  --  94.8  --  94.5  --  95.0  ?PLT 239  --  247  --  238  --  259  --  260  ? < > = values in this interval not displayed.  ? ? ?Basic Metabolic Panel: ?Recent Labs  ?Lab 05/31/21 ?0929  06/01/21 ?1102 06/02/21 ?1020 06/02/21 ?1243 06/03/21 ?0134 06/04/21 ?0203 06/05/21 ?0210 06/06/21 ?1117  ?NA 132* 131*   < > 133* 130* 135 133* 135  ?K 5.2* 5.3*   < > 4.5 4.7 4.3 4.2 4.1  ?CL 94* 92*   < > 94* 93* 9

## 2021-06-06 NOTE — Progress Notes (Addendum)
ANTICOAGULATION CONSULT NOTE - Initial Consult ? ?Pharmacy Consult for heparin ?Indication: atrial fibrillation ? ?Allergies  ?Allergen Reactions  ? Bupropion Itching  ? Dilaudid [Hydromorphone] Other (See Comments)  ?  Apnea, required intubation  ? ? ?Patient Measurements: ?Height: 5' 8"  (172.7 cm) ?Weight: 110.6 kg (243 lb 13.3 oz) ?IBW/kg (Calculated) : 63.9 ?Heparin Dosing Weight: 93kg ? ?Vital Signs: ?Temp: 98.2 ?F (36.8 ?C) (05/06 2248) ?Temp Source: Axillary (05/05 2318) ?BP: 113/68 (05/06 2500) ?Pulse Rate: 128 (05/06 0626) ? ?Labs: ?Recent Labs  ?  06/04/21 ?0203 06/04/21 ?1437 06/05/21 ?0210 06/05/21 ?1640 06/06/21 ?3704  ?HGB 7.6*   < > 7.5* 8.0* 7.6*  ?HCT 23.6*   < > 24.0* 24.9* 24.6*  ?PLT 238  --  259  --  260  ?CREATININE 4.74*  --  6.32*  --  4.67*  ? < > = values in this interval not displayed.  ? ? ? ?Estimated Creatinine Clearance: 17.5 mL/min (A) (by C-G formula based on SCr of 4.67 mg/dL (H)). ? ? ?Medical History: ?Past Medical History:  ?Diagnosis Date  ? Anaphylactic shock, unspecified, initial encounter 09/04/2018  ? Anemia   ? Anxiety   ? CAD (coronary artery disease)   ? Nonobstructive on CT 2019  ? CHF (congestive heart failure) ([place])   ? Chronic bilateral pleural effusions 10/23/2018  ? COVID-19 10/2018  ? COVID-19 virus detected 10/10/2018  ? COVID-19 virus infection 09/18/2018  ? ESRD (end stage renal disease) ([place])   ? Dialysis TTHSat- 3rd st  ? Gangrene ([place]) 09/18/2018  ? Headache(784.0)   ? High cholesterol   ? History of blood transfusion   ? Hypertension   ? Mitral regurgitation   ? moderate to severe MR 10/2018 echo  ? Nerve pain   ? "they say I have L5 nerve damage; my lumbar"  ? Neuropathy 01/29/2011  ? 04/2011 MRI L-spine:  L5-S1: Bulge/shallow broad-based protrusion greatest centrally and in the right posterior lateral position. Minimal crowding of the  upper aspect of the S1 nerve root greater on the right. Left lateral disc osteophyte with mild encroachment upon but not   significant compression of the exiting left L5 nerve root.   05/2011: Dr. Sherwood Gambler ([facility]): DJD and mild disc bu  ? Pericardial effusion   ? Pneumonia   ? ; 11/16/2018- "touch of pneumonia" - seen in ED- 11/14/2018- on antibiotic. Has had it x 2  ? Pneumonia 10/10/2018  ? PVD (peripheral vascular disease) ([place])   ? Right leg stent in [place].  (No records)  ? Restless legs   ? Rheumatoid arthritis ([place])   ? "knees" "hands", "RA"  ? Sleep apnea   ? does not use Cpap  ? Thoracic ascending aortic aneurysm ([place])   ? 4.4 cm 11/14/18 CTA  ? Type II diabetes mellitus ([place])   ? Uterine leiomyoma 01/10/2021  ? ? ?Medications:  ?Medications Prior to Admission  ?Medication Sig Dispense Refill Last Dose  ? acetaminophen (TYLENOL) 500 MG tablet Take 500-1,000 mg by mouth daily as needed for headache (pain).   Past Week  ? Ascorbic Acid (VITAMIN C PO) Take 1 tablet by mouth See admin instructions. At dialysis Monday,Wednesday and friday   Past Week  ? AURYXIA 1 GM 210 MG(Fe) tablet Take 420-840 mg by mouth See admin instructions. 840 mg twice daily with meals, occasionally take 420 mg with a snack   Past Week  ? b complex-vitamin c-folic acid (NEPHRO-VITE) 0.8 MG TABS tablet Take 1 tablet by  mouth every Monday, Wednesday, and Friday with hemodialysis.   Past Week  ? HUMIRA PEN 40 MG/0.4ML PNKT Inject 40 mg into the skin every 14 (fourteen) days.   Past Week  ? hydrOXYzine (ATARAX/VISTARIL) 25 MG tablet Take 25 mg by mouth every morning.   Past Week  ? Insulin Lispro Prot & Lispro (HUMALOG 75/25 MIX) (75-25) 100 UNIT/ML Kwikpen Inject 35 Units into the skin 2 (two) times daily with a meal. (Patient taking differently: Inject 35 Units into the skin in the morning and at bedtime.) 45 mL 3 Past Week  ? Methoxy PEG-Epoetin Beta (MIRCERA IJ) Dialysis Monday,Wednesday and friday   Past Week  ? metoprolol succinate (TOPROL-XL) 100 MG 24 hr tablet Take 100 mg by mouth daily.   Past Week at unk  ? Multiple Vitamins-Minerals  (ZINC PO) Take 1 tablet by mouth every morning.   Past Week  ? OVER THE COUNTER MEDICATION Take 1 capsule by mouth every morning. Omega XL   Past Month  ? oxyCODONE-acetaminophen (PERCOCET) 10-325 MG tablet Take 1 tablet by mouth 3 (three) times daily as needed for pain.   Past Week  ? pramipexole (MIRAPEX) 0.5 MG tablet Take 0.5 mg by mouth daily.   1 Past Week  ? rosuvastatin (CRESTOR) 40 MG tablet TAKE 1 TABLET BY MOUTH DAILY *PATIENT NEEDS APPOINTMENT* (Patient taking differently: Take 40 mg by mouth daily.) 30 tablet 10 Past Week  ? albuterol (VENTOLIN HFA) 108 (90 Base) MCG/ACT inhaler Inhale 2 puffs into the lungs every 6 (six) hours as needed for wheezing or shortness of breath. (Patient not taking: Reported on 05/27/2021) 6.7 g 0 Not Taking  ? glucose blood (ONETOUCH VERIO) test strip 1 each by Other route 2 (two) times daily. And lancets 2/day 200 each 3   ? NARCAN 4 MG/0.1ML LIQD nasal spray kit Place 1 spray into the nose as needed (accidental overdose.).  (Patient not taking: Reported on 05/27/2021)   Not Taking  ? ?Scheduled:  ? (feeding supplement) PROSource Plus  30 mL Oral BID BM  ? sodium chloride   Intravenous Once  ? sodium chloride   Intravenous Once  ? Chlorhexidine Gluconate Cloth  6 each Topical Q0600  ? clopidogrel  75 mg Oral Q breakfast  ? docusate sodium  100 mg Oral Daily  ? insulin aspart  0-5 Units Subcutaneous QHS  ? insulin aspart  0-6 Units Subcutaneous TID WC  ? insulin aspart protamine- aspart  35 Units Subcutaneous BID WC  ? metoprolol tartrate  12.5 mg Oral BID  ? pantoprazole  40 mg Oral Daily  ? rosuvastatin  40 mg Oral Daily  ? sodium chloride flush  3 mL Intravenous Q12H  ? sodium chloride flush  3 mL Intravenous Q12H  ? ?Infusions:  ? sodium chloride    ? ceFEPime (MAXIPIME) IV 1 g (06/05/21 1403)  ? heparin 1,400 Units/hr (06/05/21 1930)  ? ? ?Assessment: ?Pt with multiple medical issues who is s/p BKA. Heparin has been ordered for her afib. Holding off DOAC right now  because of high risk for bleeding. She has received multiple PRBCs. Heparin level <0.1.  ? ?Hgb 7.6, plt wnl. There was a delay in lab reporting heparin level. ? ? ?Goal of Therapy:  ?Heparin level 0.3-0.5 units/ml ?Monitor platelets by anticoagulation protocol: Yes ?  ?Plan:  ?Increase heparin infusion to 1700 units/hr ?HL in 8 hours then daily ?F/u plans to transition to [place] ? ?Thank you for allowing pharmacy to participate in this  patient's care. ? ?Reatha Harps, PharmD ?PGY1 Pharmacy Resident ?06/06/2021 8:48 AM ?Check [email or web address] for unit specific pharmacy number ? ? ? ? ?

## 2021-06-06 NOTE — Progress Notes (Signed)
?[facility] ?Progress Note  ? ?Subjective:    ?Seen and examined patient at bedside. Tolerated yesterday's HD with net UF 3L. Today c/o L knee pain. Informed of patient converting to Afib with hypotension yesterday afternoon after HD. Apparently Midodrine PRN dose was not given. Small bolus was given and evening BP meds held. Appears patient converted back to SR and Bps currently soft but stable. She denies SOB, CP, and N/V.  ? ?Objective ?Vitals:  ? 06/05/21 2318 06/06/21 7989 06/06/21 2119 06/06/21 0744  ?BP: (!) 113/57 113/64 113/68 105/60  ?Pulse: 99 (!) 128 (!) 128 (!) 129  ?Resp: '12 17 17 16  '$ ?Temp: 98.1 ?F (36.7 ?C) 98.2 ?F (36.8 ?C) 98.2 ?F (36.8 ?C) 98.2 ?F (36.8 ?C)  ?TempSrc: Axillary   Oral  ?SpO2: 96%   97%  ?Weight:  110.6 kg    ?Height:      ? ?Physical Exam ?General: chronically ill appearing, NAD ?Heart: RRR, no mrg ?Lungs: CTAB, nml WOB ?Abdomen: soft, NTND ?Extremities: trace LE edema, woody appearance. L BKA  ?Dialysis Access: RU AVF +b/t  ? ?Filed Weights  ? 06/05/21 0750 06/05/21 1223 06/06/21 0556  ?Weight: 115.8 kg 112.6 kg 110.6 kg  ? ? ?Intake/Output Summary (Last 24 hours) at 06/06/2021 1140 ?Last data filed at 06/05/2021 1223 ?Gross per 24 hour  ?Intake --  ?Output 3000 ml  ?Net -3000 ml  ? ? ?Additional Objective ?Labs: ?Basic Metabolic Panel: ?Recent Labs  ?Lab 06/04/21 ?0203 06/05/21 ?0210 06/06/21 ?4174  ?NA 135 133* 135  ?K 4.3 4.2 4.1  ?CL 96* 96* 96*  ?CO2 '24 23 25  '$ ?GLUCOSE 147* 155* 95  ?BUN 25* 40* 29*  ?CREATININE 4.74* 6.32* 4.67*  ?CALCIUM 8.4* 8.3* 8.4*  ?PHOS 4.7* 4.3 3.2  ? ?Liver Function Tests: ?Recent Labs  ?Lab 06/04/21 ?0203 06/05/21 ?0210 06/06/21 ?0814  ?AST 73* 42* 76*  ?ALT 43 24 36  ?ALKPHOS 171* 183* 178*  ?BILITOT 0.8 0.6 0.6  ?PROT 7.6 7.6 7.7  ?ALBUMIN 2.7* 2.6* 2.6*  ? ?No results for input(s): LIPASE, AMYLASE in the last 168 hours. ?CBC: ?Recent Labs  ?Lab 06/01/21 ?0755 06/02/21 ?1020 06/03/21 ?0134 06/03/21 ?1312 06/04/21 ?0203 06/04/21 ?1437  06/05/21 ?0210 06/05/21 ?1640 06/06/21 ?4818  ?WBC 17.8*  --  10.6*  --  12.9*  --  11.6*  --  9.8  ?NEUTROABS  --   --   --   --  10.3*  --  9.4*  --  7.5  ?HGB 6.4*   < > 6.1*   < > 7.6*   < > 7.5* 8.0* 7.6*  ?HCT 20.1*   < > 20.0*   < > 23.6*   < > 24.0* 24.9* 24.6*  ?MCV 97.6  --  96.2  --  94.8  --  94.5  --  95.0  ?PLT 239  --  247  --  238  --  259  --  260  ? < > = values in this interval not displayed.  ? ?Blood Culture ?   ?Component Value Date/Time  ? SDES BLOOD BLOOD LEFT FOREARM 06/04/2021 1840  ? SPECREQUEST  06/04/2021 1840  ?  BOTTLES DRAWN AEROBIC ONLY Blood Culture results may not be optimal due to an inadequate volume of blood received in culture bottles  ? CULT  06/04/2021 1840  ?  NO GROWTH 2 DAYS ?Performed at [facility], [place] [address], [place], [place] [address] ?  ? REPTSTATUS PENDING 06/04/2021 1840  ? ? ?  Cardiac Enzymes: ?No results for input(s): CKTOTAL, CKMB, CKMBINDEX, TROPONINI in the last 168 hours. ?CBG: ?Recent Labs  ?Lab 06/05/21 ?1128 06/05/21 ?1605 06/05/21 ?2111 06/06/21 ?8563 06/06/21 ?1134  ?GLUCAP 113* 239* 262* 95 163*  ? ?Iron Studies:  ?Recent Labs  ?  06/06/21 ?1497  ?IRON 40  ?TIBC 174*  ?FERRITIN >7,500*  ? ?Lab Results  ?Component Value Date  ? INR 1.0 11/14/2018  ? INR 0.90 01/30/2011  ? ?Studies/Results: ?DG CHEST PORT 1 VIEW ? ?Result Date: 06/06/2021 ?CLINICAL DATA:  Osteomyelitis. EXAM: PORTABLE CHEST 1 VIEW COMPARISON:  06/04/2021 FINDINGS: Lungs are adequately inflated and demonstrate mild linear density over the left midlung likely atelectasis. Hazy prominence of the pulmonary vasculature without change which may be due to mild vascular congestion/edema. Hazy left base/retrocardiac density unchanged and may be due to atelectasis or layering pleural fluid. Stable cardiomegaly. Remainder of the exam is unchanged. IMPRESSION: Stable cardiomegaly with suggestion of mild vascular congestion/edema. Stable hazy left base/retrocardiac density which may be due to  atelectasis or layering pleural fluid. Electronically Signed   By: Marin Olp M.D.   On: 06/06/2021 08:59  ? ?ECHOCARDIOGRAM COMPLETE ? ?Result Date: 06/05/2021 ?   ECHOCARDIOGRAM REPORT   Patient Name:   [name] Date of Exam: 06/05/2021 Medical Rec #:  [id number]          Height:       68.0 in Accession #:    [id number]         Weight:       248.2 lb Date of Birth:  09/19/1965           BSA:          2.240 m? Patient Age:    56 years           BP:           181/159 mmHg Patient Gender: F                  HR:           105 bpm. Exam Location:  Inpatient Procedure: 2D Echo, Cardiac Doppler, Color Doppler and Intracardiac            Opacification Agent Indications:    Atrial fibrillation  History:        Patient has prior history of Echocardiogram examinations, most                 recent 11/25/2020. CAD; Risk Factors:Hypertension, Diabetes and                 Dyslipidemia. ESRD.  Sonographer:    Clayton Lefort RDCS (AE) Referring Phys: [phone] A CALDWELL POWELL JR  Sonographer Comments: Technically challenging study due to limited acoustic windows, Technically difficult study due to poor echo windows and patient is morbidly obese. Image acquisition challenging due to patient body habitus. Extremely challenging exam. 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 Patient moving thoughout exam. IMPRESSIONS  1. Left ventricular ejection fraction, by estimation, is 60 to 65%. The left ventricle has normal function. The left ventricle has no regional wall motion abnormalities. There is moderate left ventricular hypertrophy. Left ventricular diastolic function  could not be evaluated. There is the interventricular septum is flattened in systole, consistent with right ventricular pressure overload.  2. Right ventricular systolic function is mildly reduced. The right ventricular size is mildly enlarged. There is severely elevated pulmonary artery systolic pressure.  3. The mitral valve  is  grossly normal. Mild mitral valve regurgitation.  4. Tricuspid valve regurgitation is moderate.  5. Aortic valve regurgitation is not visualized.  6. Aortic no significant aortic aneurysm. Conclusion(s)/Recommendation(s): RVSP has increased from prior study. FINDINGS  Left Ventricle: Left ventricular ejection fraction, by estimation, is 60 to 65%. The left ventricle has normal function. The left ventricle has no regional wall motion abnormalities. Definity contrast agent was given IV to delineate the left ventricular  endocardial borders. The left ventricular internal cavity size was normal in size. There is moderate left ventricular hypertrophy. The interventricular septum is flattened in systole, consistent with right ventricular pressure overload. Left ventricular  diastolic function could not be evaluated. Right Ventricle: The right ventricular size is mildly enlarged. Right ventricular systolic function is mildly reduced. There is severely elevated pulmonary artery systolic pressure. The tricuspid regurgitant velocity is 3.77 m/s, and with an assumed right atrial pressure of 15 mmHg, the estimated right ventricular systolic pressure is 27.2 mmHg. Left Atrium: Left atrial size was not well visualized. Right Atrium: Right atrial size was not well visualized. Pericardium: There is no evidence of pericardial effusion. Mitral Valve: The mitral valve is grossly normal. Mild mitral valve regurgitation. Tricuspid Valve: The tricuspid valve is grossly normal. Tricuspid valve regurgitation is moderate. Aortic Valve: Aortic valve regurgitation is not visualized. Aortic valve mean gradient measures 9.0 mmHg. Aortic valve peak gradient measures 17.3 mmHg. Aortic valve area, by VTI measures 1.73 cm?. Pulmonic Valve: Pulmonic valve regurgitation is not visualized. Aorta: No significant aortic aneurysm. IAS/Shunts: The interatrial septum was not well visualized.  LEFT VENTRICLE PLAX 2D LVIDd:         4.30 cm LVIDs:          2.30 cm LV PW:         1.70 cm LV IVS:        1.20 cm LVOT diam:     2.00 cm LV SV:         53 LV SV Index:   24 LVOT Area:     3.14 cm?  RIGHT VENTRICLE             IVC RV Basal diam:  3.70 cm     I

## 2021-06-07 ENCOUNTER — Inpatient Hospital Stay (HOSPITAL_COMMUNITY): Payer: Medicare Other

## 2021-06-07 DIAGNOSIS — M86272 Subacute osteomyelitis, left ankle and foot: Secondary | ICD-10-CM | POA: Diagnosis not present

## 2021-06-07 LAB — BLOOD GAS, VENOUS
Acid-Base Excess: 1.2 mmol/L (ref 0.0–2.0)
Bicarbonate: 27.6 mmol/L (ref 20.0–28.0)
Drawn by: 164
O2 Saturation: 98.6 %
Patient temperature: 37
pCO2, Ven: 50 mmHg (ref 44–60)
pH, Ven: 7.35 (ref 7.25–7.43)
pO2, Ven: 101 mmHg — ABNORMAL HIGH (ref 32–45)

## 2021-06-07 LAB — COMPREHENSIVE METABOLIC PANEL
ALT: 66 U/L — ABNORMAL HIGH (ref 0–44)
AST: 128 U/L — ABNORMAL HIGH (ref 15–41)
Albumin: 2.5 g/dL — ABNORMAL LOW (ref 3.5–5.0)
Alkaline Phosphatase: 199 U/L — ABNORMAL HIGH (ref 38–126)
Anion gap: 15 (ref 5–15)
BUN: 40 mg/dL — ABNORMAL HIGH (ref 6–20)
CO2: 23 mmol/L (ref 22–32)
Calcium: 8.6 mg/dL — ABNORMAL LOW (ref 8.9–10.3)
Chloride: 97 mmol/L — ABNORMAL LOW (ref 98–111)
Creatinine, Ser: 5.6 mg/dL — ABNORMAL HIGH (ref 0.44–1.00)
GFR, Estimated: 8 mL/min — ABNORMAL LOW (ref >60)
Glucose, Bld: 120 mg/dL — ABNORMAL HIGH (ref 70–99)
Potassium: 4.3 mmol/L (ref 3.5–5.1)
Sodium: 135 mmol/L (ref 135–145)
Total Bilirubin: 0.7 mg/dL (ref 0.3–1.2)
Total Protein: 7.4 g/dL (ref 6.5–8.1)

## 2021-06-07 LAB — CBC WITH DIFFERENTIAL/PLATELET
Abs Immature Granulocytes: 0.43 10*3/uL — ABNORMAL HIGH (ref 0.00–0.07)
Basophils Absolute: 0.1 10*3/uL (ref 0.0–0.1)
Basophils Relative: 1 %
Eosinophils Absolute: 0.4 10*3/uL (ref 0.0–0.5)
Eosinophils Relative: 4 %
HCT: 24.2 % — ABNORMAL LOW (ref 36.0–46.0)
Hemoglobin: 7.4 g/dL — ABNORMAL LOW (ref 12.0–15.0)
Immature Granulocytes: 5 %
Lymphocytes Relative: 15 %
Lymphs Abs: 1.4 10*3/uL (ref 0.7–4.0)
MCH: 29.5 pg (ref 26.0–34.0)
MCHC: 30.6 g/dL (ref 30.0–36.0)
MCV: 96.4 fL (ref 80.0–100.0)
Monocytes Absolute: 0.6 10*3/uL (ref 0.1–1.0)
Monocytes Relative: 6 %
Neutro Abs: 6.6 10*3/uL (ref 1.7–7.7)
Neutrophils Relative %: 69 %
Platelets: 266 10*3/uL (ref 150–400)
RBC: 2.51 MIL/uL — ABNORMAL LOW (ref 3.87–5.11)
RDW: 15.9 % — ABNORMAL HIGH (ref 11.5–15.5)
WBC: 9.4 10*3/uL (ref 4.0–10.5)
nRBC: 1 % — ABNORMAL HIGH (ref 0.0–0.2)

## 2021-06-07 LAB — GLUCOSE, CAPILLARY
Glucose-Capillary: 101 mg/dL — ABNORMAL HIGH (ref 70–99)
Glucose-Capillary: 92 mg/dL (ref 70–99)
Glucose-Capillary: 127 mg/dL — ABNORMAL HIGH (ref 70–99)
Glucose-Capillary: 122 mg/dL — ABNORMAL HIGH (ref 70–99)

## 2021-06-07 LAB — HEPARIN LEVEL (UNFRACTIONATED)
Heparin Unfractionated: <0.1 IU/mL — ABNORMAL LOW (ref 0.30–0.70)
Heparin Unfractionated: 0.19 IU/mL — ABNORMAL LOW (ref 0.30–0.70)
Heparin Unfractionated: 0.24 IU/mL — ABNORMAL LOW (ref 0.30–0.70)

## 2021-06-07 LAB — PHOSPHORUS: Phosphorus: 3.8 mg/dL (ref 2.5–4.6)

## 2021-06-07 LAB — MAGNESIUM: Magnesium: 2 mg/dL (ref 1.7–2.4)

## 2021-06-07 MED ORDER — SODIUM CHLORIDE 0.9 % IV SOLN: 100.0000 mL | INTRAVENOUS | Status: DC | PRN

## 2021-06-07 MED ORDER — LIDOCAINE HCL (PF) 1 % IJ SOLN: 5.0000 mL | INTRAMUSCULAR | Status: DC | PRN

## 2021-06-07 MED ORDER — LIDOCAINE-PRILOCAINE 2.5-2.5 % EX CREA
1.0000 "application " | TOPICAL_CREAM | CUTANEOUS | Status: DC | PRN
  Filled 2021-06-07: qty 5

## 2021-06-07 MED ORDER — LEVALBUTEROL HCL 1.25 MG/0.5ML IN NEBU: 1.2500 mg | INHALATION_SOLUTION | Freq: Three times a day (TID) | RESPIRATORY_TRACT | Status: DC | PRN

## 2021-06-07 MED ORDER — HEPARIN SODIUM (PORCINE) 1000 UNIT/ML DIALYSIS
1000.0000 [IU] | INTRAMUSCULAR | Status: DC | PRN
  Filled 2021-06-07 (×2): qty 1

## 2021-06-07 MED ORDER — ALTEPLASE 2 MG IJ SOLR: 2.0000 mg | Freq: Once | INTRAMUSCULAR | Status: DC | PRN

## 2021-06-07 MED ORDER — PRAMIPEXOLE DIHYDROCHLORIDE 0.125 MG PO TABS
0.1250 mg | ORAL_TABLET | Freq: Every day | ORAL | Status: DC
  Administered 2021-06-07 – 2021-06-10 (×4): 0.125 mg via ORAL
  Filled 2021-06-07 (×5): qty 1

## 2021-06-07 MED ORDER — SODIUM CHLORIDE 0.9 % IV SOLN
2.0000 g | INTRAVENOUS | Status: DC
  Filled 2021-06-07: qty 12.5

## 2021-06-07 MED ORDER — PENTAFLUOROPROP-TETRAFLUOROETH EX AERO: 1.0000 "application " | INHALATION_SPRAY | CUTANEOUS | Status: DC | PRN

## 2021-06-07 NOTE — Progress Notes (Signed)
Patient refused CPAP.  Patient states she does not wear one. ?

## 2021-06-07 NOTE — Progress Notes (Signed)
ANTICOAGULATION CONSULT NOTE ? ?Pharmacy Consult for heparin ?Indication: atrial fibrillation ? ?Allergies  ?Allergen Reactions  ? Bupropion Itching  ? Dilaudid [Hydromorphone] Other (See Comments)  ?  Apnea, required intubation  ? ? ?Patient Measurements: ?Height: 5' 8" (172.7 cm) ?Weight: 117.5 kg (259 lb 0.7 oz) ?IBW/kg (Calculated) : 63.9 ?Heparin Dosing Weight: 93kg ? ?Vital Signs: ?Temp: 98 ?F (36.7 ?C) (05/07 1159) ?Temp Source: Oral (05/07 1159) ?BP: 137/40 (05/07 1159) ?Pulse Rate: 82 (05/07 0839) ? ?Labs: ?Recent Labs  ?  06/05/21 ?0210 06/05/21 ?1640 06/05/21 ?1640 06/06/21 ?6283 06/06/21 ?1609 06/07/21 ?0142 06/07/21 ?1120  ?HGB 7.5* 8.0*  --  7.6*  --  7.4*  --   ?HCT 24.0* 24.9*  --  24.6*  --  24.2*  --   ?PLT 259  --   --  260  --  266  --   ?HEPARINUNFRC  --   --    < > <0.10* <0.10* <0.10* 0.19*  ?CREATININE 6.32*  --   --  4.67*  --  5.60*  --   ? < > = values in this interval not displayed.  ? ? ? ?Estimated Creatinine Clearance: 15.1 mL/min (A) (by C-G formula based on SCr of 5.6 mg/dL (H)). ? ? ?Medical History: ?Past Medical History:  ?Diagnosis Date  ? Anaphylactic shock, unspecified, initial encounter 09/04/2018  ? Anemia   ? Anxiety   ? CAD (coronary artery disease)   ? Nonobstructive on CT 2019  ? CHF (congestive heart failure) ([place])   ? Chronic bilateral pleural effusions 10/23/2018  ? COVID-19 10/2018  ? COVID-19 virus detected 10/10/2018  ? COVID-19 virus infection 09/18/2018  ? ESRD (end stage renal disease) ([place])   ? Dialysis TTHSat- 3rd st  ? Gangrene ([place]) 09/18/2018  ? Headache(784.0)   ? High cholesterol   ? History of blood transfusion   ? Hypertension   ? Mitral regurgitation   ? moderate to severe MR 10/2018 echo  ? Nerve pain   ? "they say I have L5 nerve damage; my lumbar"  ? Neuropathy 01/29/2011  ? 04/2011 MRI L-spine:  L5-S1: Bulge/shallow broad-based protrusion greatest centrally and in the right posterior lateral position. Minimal crowding of the  upper aspect of the S1  nerve root greater on the right. Left lateral disc osteophyte with mild encroachment upon but not  significant compression of the exiting left L5 nerve root.   05/2011: Dr. Sherwood Gambler ([facility]): DJD and mild disc bu  ? Pericardial effusion   ? Pneumonia   ? ; 11/16/2018- "touch of pneumonia" - seen in ED- 11/14/2018- on antibiotic. Has had it x 2  ? Pneumonia 10/10/2018  ? PVD (peripheral vascular disease) ([place])   ? Right leg stent in [place].  (No records)  ? Restless legs   ? Rheumatoid arthritis ([place])   ? "knees" "hands", "RA"  ? Sleep apnea   ? does not use Cpap  ? Thoracic ascending aortic aneurysm ([place])   ? 4.4 cm 11/14/18 CTA  ? Type II diabetes mellitus ([place])   ? Uterine leiomyoma 01/10/2021  ? ? ?Medications:  ?Medications Prior to Admission  ?Medication Sig Dispense Refill Last Dose  ? acetaminophen (TYLENOL) 500 MG tablet Take 500-1,000 mg by mouth daily as needed for headache (pain).   Past Week  ? Ascorbic Acid (VITAMIN C PO) Take 1 tablet by mouth See admin instructions. At dialysis Monday,Wednesday and friday   Past Week  ? AURYXIA 1 GM 210 MG(Fe) tablet Take  420-840 mg by mouth See admin instructions. 840 mg twice daily with meals, occasionally take 420 mg with a snack   Past Week  ? b complex-vitamin c-folic acid (NEPHRO-VITE) 0.8 MG TABS tablet Take 1 tablet by mouth every Monday, Wednesday, and Friday with hemodialysis.   Past Week  ? HUMIRA PEN 40 MG/0.4ML PNKT Inject 40 mg into the skin every 14 (fourteen) days.   Past Week  ? hydrOXYzine (ATARAX/VISTARIL) 25 MG tablet Take 25 mg by mouth every morning.   Past Week  ? Insulin Lispro Prot & Lispro (HUMALOG 75/25 MIX) (75-25) 100 UNIT/ML Kwikpen Inject 35 Units into the skin 2 (two) times daily with a meal. (Patient taking differently: Inject 35 Units into the skin in the morning and at bedtime.) 45 mL 3 Past Week  ? Methoxy PEG-Epoetin Beta (MIRCERA IJ) Dialysis Monday,Wednesday and friday   Past Week  ? metoprolol succinate (TOPROL-XL)  100 MG 24 hr tablet Take 100 mg by mouth daily.   Past Week at unk  ? Multiple Vitamins-Minerals (ZINC PO) Take 1 tablet by mouth every morning.   Past Week  ? OVER THE COUNTER MEDICATION Take 1 capsule by mouth every morning. Omega XL   Past Month  ? oxyCODONE-acetaminophen (PERCOCET) 10-325 MG tablet Take 1 tablet by mouth 3 (three) times daily as needed for pain.   Past Week  ? pramipexole (MIRAPEX) 0.5 MG tablet Take 0.5 mg by mouth daily.   1 Past Week  ? rosuvastatin (CRESTOR) 40 MG tablet TAKE 1 TABLET BY MOUTH DAILY *PATIENT NEEDS APPOINTMENT* (Patient taking differently: Take 40 mg by mouth daily.) 30 tablet 10 Past Week  ? albuterol (VENTOLIN HFA) 108 (90 Base) MCG/ACT inhaler Inhale 2 puffs into the lungs every 6 (six) hours as needed for wheezing or shortness of breath. (Patient not taking: Reported on 05/27/2021) 6.7 g 0 Not Taking  ? glucose blood (ONETOUCH VERIO) test strip 1 each by Other route 2 (two) times daily. And lancets 2/day 200 each 3   ? NARCAN 4 MG/0.1ML LIQD nasal spray kit Place 1 spray into the nose as needed (accidental overdose.).  (Patient not taking: Reported on 05/27/2021)   Not Taking  ? ?Scheduled:  ? (feeding supplement) PROSource Plus  30 mL Oral BID BM  ? sodium chloride   Intravenous Once  ? sodium chloride   Intravenous Once  ? Chlorhexidine Gluconate Cloth  6 each Topical Q0600  ? clopidogrel  75 mg Oral Q breakfast  ? docusate sodium  100 mg Oral Daily  ? insulin aspart  0-5 Units Subcutaneous QHS  ? insulin aspart  0-6 Units Subcutaneous TID WC  ? insulin aspart protamine- aspart  28 Units Subcutaneous BID WC  ? metoprolol tartrate  12.5 mg Oral BID  ? pantoprazole  40 mg Oral Daily  ? rosuvastatin  40 mg Oral Daily  ? sodium chloride flush  3 mL Intravenous Q12H  ? sodium chloride flush  3 mL Intravenous Q12H  ? ?Infusions:  ? sodium chloride    ? sodium chloride    ? sodium chloride    ? ceFEPime (MAXIPIME) IV 1 g (06/07/21 1223)  ? heparin 2,150 Units/hr (06/07/21 0606)   ? ? ?Assessment: ?Pt with multiple medical issues who is s/p BKA. Heparin has been ordered for her afib. Holding off DOAC right now because of high risk for bleeding. She has received multiple PRBCs. Heparin level is detectible, but subtherapeutic at 0.19. Hg declined to 7.4. Platelets stable. ? ?  Goal of Therapy:  ?Heparin level 0.3-0.5 units/ml ?Monitor platelets by anticoagulation protocol: Yes ?  ?Plan:  ?Increase heparin to 2350 units/h ?Recheck heparin level in 8 hours ? ?Thank you for allowing pharmacy to participate in this patient's care. ? ?Reatha Harps, PharmD ?PGY1 Pharmacy Resident ?06/07/2021 1:02 PM ?Check [email or web address] for unit specific pharmacy number ? ? ? ? ?

## 2021-06-07 NOTE — Progress Notes (Signed)
ANTICOAGULATION CONSULT NOTE ? ?Pharmacy Consult for heparin ?Indication: atrial fibrillation ? ?Allergies  ?Allergen Reactions  ? Bupropion Itching  ? Dilaudid [Hydromorphone] Other (See Comments)  ?  Apnea, required intubation  ? ? ?Patient Measurements: ?Height: 5' 8"  (172.7 cm) ?Weight: 110.6 kg (243 lb 13.3 oz) ?IBW/kg (Calculated) : 63.9 ?Heparin Dosing Weight: 93kg ? ?Vital Signs: ?Temp: 98.3 ?F (36.8 ?C) (05/06 2100) ?Temp Source: Oral (05/06 2100) ?BP: 129/81 (05/06 2100) ?Pulse Rate: 81 (05/06 2100) ? ?Labs: ?Recent Labs  ?  06/05/21 ?0210 06/05/21 ?1640 06/06/21 ?6237 06/06/21 ?1609 06/07/21 ?0142  ?HGB 7.5* 8.0* 7.6*  --  7.4*  ?HCT 24.0* 24.9* 24.6*  --  24.2*  ?PLT 259  --  260  --  266  ?HEPARINUNFRC  --   --  <0.10* <0.10* <0.10*  ?CREATININE 6.32*  --  4.67*  --  5.60*  ? ? ? ?Estimated Creatinine Clearance: 14.6 mL/min (A) (by C-G formula based on SCr of 5.6 mg/dL (H)). ? ? ?Medical History: ?Past Medical History:  ?Diagnosis Date  ? Anaphylactic shock, unspecified, initial encounter 09/04/2018  ? Anemia   ? Anxiety   ? CAD (coronary artery disease)   ? Nonobstructive on CT 2019  ? CHF (congestive heart failure) ([place])   ? Chronic bilateral pleural effusions 10/23/2018  ? COVID-19 10/2018  ? COVID-19 virus detected 10/10/2018  ? COVID-19 virus infection 09/18/2018  ? ESRD (end stage renal disease) ([place])   ? Dialysis TTHSat- 3rd st  ? Gangrene ([place]) 09/18/2018  ? Headache(784.0)   ? High cholesterol   ? History of blood transfusion   ? Hypertension   ? Mitral regurgitation   ? moderate to severe MR 10/2018 echo  ? Nerve pain   ? "they say I have L5 nerve damage; my lumbar"  ? Neuropathy 01/29/2011  ? 04/2011 MRI L-spine:  L5-S1: Bulge/shallow broad-based protrusion greatest centrally and in the right posterior lateral position. Minimal crowding of the  upper aspect of the S1 nerve root greater on the right. Left lateral disc osteophyte with mild encroachment upon but not  significant compression of  the exiting left L5 nerve root.   05/2011: Dr. Sherwood Gambler ([facility]): DJD and mild disc bu  ? Pericardial effusion   ? Pneumonia   ? ; 11/16/2018- "touch of pneumonia" - seen in ED- 11/14/2018- on antibiotic. Has had it x 2  ? Pneumonia 10/10/2018  ? PVD (peripheral vascular disease) ([place])   ? Right leg stent in [place].  (No records)  ? Restless legs   ? Rheumatoid arthritis ([place])   ? "knees" "hands", "RA"  ? Sleep apnea   ? does not use Cpap  ? Thoracic ascending aortic aneurysm ([place])   ? 4.4 cm 11/14/18 CTA  ? Type II diabetes mellitus ([place])   ? Uterine leiomyoma 01/10/2021  ? ? ?Medications:  ?Medications Prior to Admission  ?Medication Sig Dispense Refill Last Dose  ? acetaminophen (TYLENOL) 500 MG tablet Take 500-1,000 mg by mouth daily as needed for headache (pain).   Past Week  ? Ascorbic Acid (VITAMIN C PO) Take 1 tablet by mouth See admin instructions. At dialysis Monday,Wednesday and friday   Past Week  ? AURYXIA 1 GM 210 MG(Fe) tablet Take 420-840 mg by mouth See admin instructions. 840 mg twice daily with meals, occasionally take 420 mg with a snack   Past Week  ? b complex-vitamin c-folic acid (NEPHRO-VITE) 0.8 MG TABS tablet Take 1 tablet by mouth every Monday, Wednesday, and  Friday with hemodialysis.   Past Week  ? HUMIRA PEN 40 MG/0.4ML PNKT Inject 40 mg into the skin every 14 (fourteen) days.   Past Week  ? hydrOXYzine (ATARAX/VISTARIL) 25 MG tablet Take 25 mg by mouth every morning.   Past Week  ? Insulin Lispro Prot & Lispro (HUMALOG 75/25 MIX) (75-25) 100 UNIT/ML Kwikpen Inject 35 Units into the skin 2 (two) times daily with a meal. (Patient taking differently: Inject 35 Units into the skin in the morning and at bedtime.) 45 mL 3 Past Week  ? Methoxy PEG-Epoetin Beta (MIRCERA IJ) Dialysis Monday,Wednesday and friday   Past Week  ? metoprolol succinate (TOPROL-XL) 100 MG 24 hr tablet Take 100 mg by mouth daily.   Past Week at unk  ? Multiple Vitamins-Minerals (ZINC PO) Take 1 tablet by  mouth every morning.   Past Week  ? OVER THE COUNTER MEDICATION Take 1 capsule by mouth every morning. Omega XL   Past Month  ? oxyCODONE-acetaminophen (PERCOCET) 10-325 MG tablet Take 1 tablet by mouth 3 (three) times daily as needed for pain.   Past Week  ? pramipexole (MIRAPEX) 0.5 MG tablet Take 0.5 mg by mouth daily.   1 Past Week  ? rosuvastatin (CRESTOR) 40 MG tablet TAKE 1 TABLET BY MOUTH DAILY *PATIENT NEEDS APPOINTMENT* (Patient taking differently: Take 40 mg by mouth daily.) 30 tablet 10 Past Week  ? albuterol (VENTOLIN HFA) 108 (90 Base) MCG/ACT inhaler Inhale 2 puffs into the lungs every 6 (six) hours as needed for wheezing or shortness of breath. (Patient not taking: Reported on 05/27/2021) 6.7 g 0 Not Taking  ? glucose blood (ONETOUCH VERIO) test strip 1 each by Other route 2 (two) times daily. And lancets 2/day 200 each 3   ? NARCAN 4 MG/0.1ML LIQD nasal spray kit Place 1 spray into the nose as needed (accidental overdose.).  (Patient not taking: Reported on 05/27/2021)   Not Taking  ? ?Scheduled:  ? (feeding supplement) PROSource Plus  30 mL Oral BID BM  ? sodium chloride   Intravenous Once  ? sodium chloride   Intravenous Once  ? Chlorhexidine Gluconate Cloth  6 each Topical Q0600  ? clopidogrel  75 mg Oral Q breakfast  ? docusate sodium  100 mg Oral Daily  ? insulin aspart  0-5 Units Subcutaneous QHS  ? insulin aspart  0-6 Units Subcutaneous TID WC  ? insulin aspart protamine- aspart  28 Units Subcutaneous BID WC  ? metoprolol tartrate  12.5 mg Oral BID  ? pantoprazole  40 mg Oral Daily  ? rosuvastatin  40 mg Oral Daily  ? sodium chloride flush  3 mL Intravenous Q12H  ? sodium chloride flush  3 mL Intravenous Q12H  ? traZODone  50 mg Oral QHS  ? ?Infusions:  ? sodium chloride    ? ceFEPime (MAXIPIME) IV 1 g (06/06/21 0926)  ? heparin 1,950 Units/hr (06/07/21 0228)  ? ? ?Assessment: ?Pt with multiple medical issues who is s/p BKA. Heparin has been ordered for her afib. Holding off DOAC right now  because of high risk for bleeding. She has received multiple PRBCs.  ? ?5/7 AM update:  ?Heparin level low ?Hgb low but stable from yesterday  ? ?Goal of Therapy:  ?Heparin level 0.3-0.5 units/ml ?Monitor platelets by anticoagulation protocol: Yes ?  ?Plan:  ?Increase heparin to 2150 units/h ?Recheck heparin level in 8 hours ? ?Narda Bonds, PharmD, BCPS ?Clinical Pharmacist ?Phone: [phone] ? ? ? ?

## 2021-06-07 NOTE — Progress Notes (Addendum)
Pharmacy Antibiotic Note ? ?[name] is a 56 y.o. female admitted on 05/25/2021 with  wound infection/bacteremia .  Pharmacy has been consulted for cefepime  dosing. She is noted s/p Toe amp 4/27 and L BKA 5/2. Patient growing Klebsiella and MRSE (likely contaminant) in blood. HD regimen MWF, will transition cefepime to with HD. ? ?Original plan was to treat 5-7 days post-op. 5/9 would be 7 days.  ? ?Plan: ?Continue cefepime 2g IV MWF with HD ?Will follow plans for length of therapy ? ? ?Height: '5\' 8"'$  (172.7 cm) ?Weight: 117.5 kg (259 lb 0.7 oz) ?IBW/kg (Calculated) : 63.9 ? ?Temp (24hrs), Avg:98 ?F (36.7 ?C), Min:97.5 ?F (36.4 ?C), Max:98.3 ?F (36.8 ?C) ? ?Recent Labs  ?Lab 06/03/21 ?0134 06/04/21 ?0203 06/05/21 ?0210 06/06/21 ?1601 06/07/21 ?0142  ?WBC 10.6* 12.9* 11.6* 9.8 9.4  ?CREATININE 6.49* 4.74* 6.32* 4.67* 5.60*  ? ?  ?Estimated Creatinine Clearance: 15.1 mL/min (A) (by C-G formula based on SCr of 5.6 mg/dL (H)).   ? ?Allergies  ?Allergen Reactions  ? Bupropion Itching  ? Dilaudid [Hydromorphone] Other (See Comments)  ?  Apnea, required intubation  ? ? ?Antimicrobials this admission: ?Cefepime 4/24 >>  ?Vancomycin 4/24 >> 4/30 ? ?Dose adjustments this admission: ? ? ?Microbiology results: ?4/24 BCx: 2/4 MR s epi, 1/4 Enterobacter aerogenes (R: cefazolin, sens everything else) ?4/27BCID (1/4) Kleb aerogenes ? ?Thank you for allowing pharmacy to participate in this patient's care. ? ?Reatha Harps, PharmD ?PGY1 Pharmacy Resident ?06/07/2021 1:19 PM ?Check [email or web address] for unit specific pharmacy number ? ? ? ? ?

## 2021-06-07 NOTE — Assessment & Plan Note (Addendum)
CXR with evidence of edema, possible L effusion ?Currently on 2 L, satting in 90's ?Will continue to monitor, volume per renal  ?Continue nightly bipap, I think this has improved her mental status today ?

## 2021-06-07 NOTE — Progress Notes (Signed)
?PROGRESS NOTE ? ? ? ?[name]  MEQ:[id number] DOB: 14-Jan-1966 DOA: 05/25/2021 ?PCP: Sandi Mariscal, MD  ?Chief Complaint  ?Patient presents with  ? Wound Check  ? ? 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  She had Selah Klang chronic wound on her left fifth toe and last 3 days it has been bleeding and draining.  In the emergency room temperature 101.8, tachycardia 140, blood pressure stable.  On 4 L oxygen.  Hemoglobin 7.2.  Lactic acid 2.4.  Left foot x-ray with soft tissue swelling and osteomyelitis fifth phalanx.  Started on antibiotics, orthopedics , vascular and nephrology consulted. ?Fourth and fifth metatarsal amputation 4/27. 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  ? ? ?Assessment & Plan: ?  ?Principal Problem: ?  Osteomyelitis ([place]) ?Active Problems: ?  Shortness of breath ?  Bacteremia due to Enterobacter aerogenes ?  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  Anemia in chronic kidney disease ?  Hypotension ?  Atrial fibrillation with RVR ([place]) ?  Asterixis ?  Acute metabolic encephalopathy ?  Hypertension associated with diabetes ([place]) ?  Hyperlipidemia associated with type 2 diabetes mellitus ([place]) ?  Rheumatoid arthritis ([place]) ?  ESRD (end stage renal disease) ([place]) ?  Type 2 diabetes mellitus with diabetic chronic kidney disease ([place]) ?  Diastolic dysfunction ?  Chronic pain disorder ?  Coronary artery disease ?  Obesity (BMI 30-39.9) ?  Sepsis ([place]) ? ?Assessment and Plan: ?Shortness of breath ?CXR with vascular congestion/edema ?Repeat today ?Currently on 2 L, satting in 90's ?Will continue to monitor, volume per renal ? ?Bacteremia due to Enterobacter aerogenes ?Likely due to osteomyelitis to L foot ?Now s/p amputation ?1/2  sets with enterobacter aerogenes from 4/24 (aerobic bottle only) ?1/2 sets with staph epidermidis (aerobic/anaerobic) - thought contaminant  ?Repeat cx 4/28 and 4/29 no growth  ?Recurrent fever 5/3 PM -> follow repeat cultures (no growth), CXR with consolidation in bases edema with patchy lower zonal haziness (ground glass edema and/or pneumonia) -> repeat CXR 5/6 (pending) ?Enterobacter aerogenes resistant to ancef  ?Cefepime 4/28 - (plan for 5/9) ?Plain films from 4/24 with evidence of soft tissue infection/osteomyelitis  ?S/p amputation L 4th and 5th toes on 4/27 ?Now s/p L BKA on 5/2  ? ? ? 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 ? ?Atrial fibrillation with RVR ([place]) ?Improved, rate now controlled ?TSH wnl ?chadvasc at least 4 ?Echo with Ef 60-65%, IV septum flattened in systole, c/w RV pressure overload, RVSF mildly reduced, severely elevated PASP -> increased RVSP from prior study, IV septum flattened in systole noted on 11/2020 echo ?Continue heparin gtt (need to watch Hb closely with need for transfusion previously, no bolus) ?Metop PO and IV ordered with holding parameters ? ?Hypotension ?Improved (5/5 needed bolus and midodrine), will follow ?Midodrine, albumin during dialysis ? ?Anemia in  chronic kidney disease ?S/p 5 unit pRBC  ?Follow post transfusion Hb ?Repeat anemia labs - AOCD ? ?Acute metabolic encephalopathy ?Related to opiates, s/p narcan ?Opiates d/c'd ?Still very sleepy,  falls asleep during conversation - sister notes this is not much different than home.  ? OSA -> trial of bipap.   ?Ammonia wnl, VBG without hypercarbia ? ?Asterixis ?Suspect related to opiates in renal dz  ?VBG without hypercarbia ?Continue to hold opiates ? ?Hypertension associated with diabetes ([place]) ?Metop with holding parameters ? ?Hyperlipidemia associated with type 2 diabetes mellitus ([place]) ?rosuvastatin ? ?Rheumatoid arthritis ([place]) ?humira on hold ? ?ESRD (end stage renal disease) ([place]) ?Renal consulted ? ? ?Type 2 diabetes mellitus with diabetic chronic kidney disease ([place]) ?SSI, 70/30 ? ? ?DVT prophylaxis: SCD ?Code Status: full ?Family Communication: none ?Disposition:  ? ?Status is: Inpatient ?Remains inpatient appropriate because: need for continued abx, continued vascular eval ?  ?Consultants:  ?Vascular ?renal ? ?Procedures:  ?4/26 ?Procedure Performed: ?1.  Ultrasound-guided micropuncture access of the right common femoral artery ?2.  Aortogram ?3.  Second-order cannulation, left lower extremity angiogram ?4.  Laser atherectomy of the anterior tibial artery ?5.  Balloon angioplasty of the anterior tibial artery ?6.  Device assisted closure-Mynx ? ?4/27 ?Amputation of the left fourth and fifth toes including metatarsal bones and sharp excisional debridement left foot of skin and soft tissue measuring 12 x 7 cm ?  ?5/2 ?Left below-knee amputation ? ?Antimicrobials:  ?Anti-infectives (From admission, onward)  ? ? Start     Dose/Rate Route Frequency Ordered Stop  ? 05/29/21 0845  ceFEPIme (MAXIPIME) 2 g in sodium chloride 0.9 % 100 mL IVPB       ? 2 g ?200 mL/hr over 30 Minutes Intravenous  Once 05/29/21 0836 05/29/21 1220  ? 05/27/21 1200  vancomycin (VANCOCIN) IVPB 1000 mg/200 mL premix  Status:  Discontinued       ? 1,000 mg ?200 mL/hr over 60 Minutes Intravenous Every M-W-F (Hemodialysis) 05/27/21 0844 05/31/21 0724  ? 05/26/21 2200  ceFEPIme (MAXIPIME) 1 g in sodium chloride 0.9 % 100 mL IVPB        ? 1 g ?200 mL/hr over 30 Minutes Intravenous Every 24 hours 05/25/21 2255    ? 05/25/21 2200  ceFEPIme (MAXIPIME) 2 g in sodium chloride 0.9 % 100 mL IVPB       ? 2 g ?200 mL/hr over 30 Minutes Intravenous  Once 05/25/21 2123 05/25/21 2229  ? 05/25/21 2100  vancomycin (VANCOREADY) IVPB 2000 mg/400 mL       ? 2,000 mg ?200 mL/hr over 120 Minutes Intravenous  Once 05/25/21 2012 05/26/21 0041  ? ?  ? ? ?Subjective: ?No complaints ?Family at bedside ?She's sleepy ? ?Objective: ?Vitals:  ? 06/06/21 2100 06/07/21 0600 06/07/21 0839 06/07/21 1159  ?BP: 129/81 103/76 119/67 (!) 137/40  ?Pulse: 81 78 82   ?Resp:  (!) 22 20   ?Temp: 98.3 ?F (36.8 ?C) 98.3 ?F (36.8 ?C) (!) 97.5 ?F (36.4 ?C) 98 ?F (36.7 ?C)  ?TempSrc: Oral Oral Oral Oral  ?SpO2: 100% 93% 95% 93%  ?Weight:  117.5 kg    ?Height:      ? ?No intake or output data in the 24 hours ending 06/07/21 1244 ? ?Filed Weights  ? 06/05/21 1223 06/06/21 0556 06/07/21 0600  ?Weight: 112.6 kg 110.6 kg 117.5 kg  ? ? ?Examination: ? ?General: No acute distress. ?Cardiovascular: RRR ?Lungs: unlabored ?Abdomen: Soft, nontender, nondistended ?Neurological: sleepy, but awakens to voice,  nods off frequently during conversation, sister says this is not unusual even at home ?Extremities: L BKA, no RLE edema ? ? ? ?Data Reviewed: I have personally reviewed following labs and imaging studies ? ?CBC: ?Recent Labs  ?Lab 06/03/21 ?0134 06/03/21 ?1312 06/04/21 ?0203 06/04/21 ?1437 06/05/21 ?0210 06/05/21 ?1640 06/06/21 ?2263 06/07/21 ?0142  ?WBC 10.6*  --  12.9*  --  11.6*  --  9.8 9.4  ?NEUTROABS  --   --  10.3*  --  9.4*  --  7.5 6.6  ?HGB 6.1*   < > 7.6* 8.0* 7.5* 8.0* 7.6* 7.4*  ?HCT 20.0*   < > 23.6* 25.7* 24.0* 24.9* 24.6* 24.2*  ?MCV 96.2  --  94.8  --  94.5  --  95.0 96.4  ?PLT 247  --  238  --  259  --  260 266  ? < > = values in this interval not displayed.  ? ? ?Basic Metabolic Panel: ?Recent Labs  ?Lab 06/01/21 ?0755 06/02/21 ?1020 06/03/21 ?0134 06/04/21 ?0203 06/05/21 ?0210  06/06/21 ?3354 06/07/21 ?0142  ?NA 131*   < > 130* 135 133* 135 135  ?K 5.3*   < > 4.7 4.3 4.2 4.1 4.3  ?CL 92*   < > 93* 96* 96* 96* 97*  ?CO2 22  --  '23 24 23 25 23  '$ ?GLUCOSE 243*   < > 308* 147* 155* 95

## 2021-06-07 NOTE — Progress Notes (Signed)
?[facility] ?Progress Note  ? ?Subjective:    ?Seen and examined patient at bedside. Patient brother and sister also at bedside. No complaints/concerns. Denies SOB, CP, and N/V. Plan for HD 06/08/21. ? ?Objective ?Vitals:  ? 06/06/21 2100 06/07/21 0600 06/07/21 0839 06/07/21 1159  ?BP: 129/81 103/76 119/67 (!) 137/40  ?Pulse: 81 78 82   ?Resp:  (!) 22 20   ?Temp: 98.3 ?F (36.8 ?C) 98.3 ?F (36.8 ?C) (!) 97.5 ?F (36.4 ?C) 98 ?F (36.7 ?C)  ?TempSrc: Oral Oral Oral Oral  ?SpO2: 100% 93% 95% 93%  ?Weight:  117.5 kg    ?Height:      ? ?Physical Exam ?General: chronically ill appearing, NAD ?Heart: RRR, no mrg ?Lungs: CTAB, nml WOB ?Abdomen: soft, NTND ?Extremities: trace LE edema, woody appearance. L BKA  ?Dialysis Access: RU AVF +b/t  ? ?Filed Weights  ? 06/05/21 1223 06/06/21 0556 06/07/21 0600  ?Weight: 112.6 kg 110.6 kg 117.5 kg  ? ?No intake or output data in the 24 hours ending 06/07/21 1224 ? ?Additional Objective ?Labs: ?Basic Metabolic Panel: ?Recent Labs  ?Lab 06/05/21 ?0210 06/06/21 ?3546 06/07/21 ?0142  ?NA 133* 135 135  ?K 4.2 4.1 4.3  ?CL 96* 96* 97*  ?CO2 '23 25 23  '$ ?GLUCOSE 155* 95 120*  ?BUN 40* 29* 40*  ?CREATININE 6.32* 4.67* 5.60*  ?CALCIUM 8.3* 8.4* 8.6*  ?PHOS 4.3 3.2 3.8  ? ?Liver Function Tests: ?Recent Labs  ?Lab 06/05/21 ?0210 06/06/21 ?5681 06/07/21 ?0142  ?AST 42* 76* 128*  ?ALT 24 36 66*  ?ALKPHOS 183* 178* 199*  ?BILITOT 0.6 0.6 0.7  ?PROT 7.6 7.7 7.4  ?ALBUMIN 2.6* 2.6* 2.5*  ? ?No results for input(s): LIPASE, AMYLASE in the last 168 hours. ?CBC: ?Recent Labs  ?Lab 06/03/21 ?0134 06/03/21 ?1312 06/04/21 ?0203 06/04/21 ?1437 06/05/21 ?0210 06/05/21 ?1640 06/06/21 ?2751 06/07/21 ?0142  ?WBC 10.6*  --  12.9*  --  11.6*  --  9.8 9.4  ?NEUTROABS  --    < > 10.3*  --  9.4*  --  7.5 6.6  ?HGB 6.1*   < > 7.6*   < > 7.5* 8.0* 7.6* 7.4*  ?HCT 20.0*   < > 23.6*   < > 24.0* 24.9* 24.6* 24.2*  ?MCV 96.2  --  94.8  --  94.5  --  95.0 96.4  ?PLT 247  --  238  --  259  --  260 266  ? < > =  values in this interval not displayed.  ? ?Blood Culture ?   ?Component Value Date/Time  ? SDES BLOOD BLOOD LEFT FOREARM 06/04/2021 1840  ? SPECREQUEST  06/04/2021 1840  ?  BOTTLES DRAWN AEROBIC ONLY Blood Culture results may not be optimal due to an inadequate volume of blood received in culture bottles  ? CULT  06/04/2021 1840  ?  NO GROWTH 3 DAYS ?Performed at [facility], [place] [address] [place], [place] [address] ?  ? REPTSTATUS PENDING 06/04/2021 1840  ? ? ?Cardiac Enzymes: ?No results for input(s): CKTOTAL, CKMB, CKMBINDEX, TROPONINI in the last 168 hours. ?CBG: ?Recent Labs  ?Lab 06/06/21 ?1134 06/06/21 ?1651 06/06/21 ?2113 06/07/21 ?4944 06/07/21 ?1204  ?GLUCAP 163* 217* 175* 101* 92  ? ?Iron Studies:  ?Recent Labs  ?  06/06/21 ?9675  ?IRON 40  ?TIBC 174*  ?FERRITIN >7,500*  ? ?Lab Results  ?Component Value Date  ? INR 1.0 11/14/2018  ? INR 0.90 01/30/2011  ? ?Studies/Results: ?DG CHEST PORT  1 VIEW ? ?Result Date: 06/06/2021 ?CLINICAL DATA:  Osteomyelitis. EXAM: PORTABLE CHEST 1 VIEW COMPARISON:  06/04/2021 FINDINGS: Lungs are adequately inflated and demonstrate mild linear density over the left midlung likely atelectasis. Hazy prominence of the pulmonary vasculature without change which may be due to mild vascular congestion/edema. Hazy left base/retrocardiac density unchanged and may be due to atelectasis or layering pleural fluid. Stable cardiomegaly. Remainder of the exam is unchanged. IMPRESSION: Stable cardiomegaly with suggestion of mild vascular congestion/edema. Stable hazy left base/retrocardiac density which may be due to atelectasis or layering pleural fluid. Electronically Signed   By: Marin Olp M.D.   On: 06/06/2021 08:59  ? ?ECHOCARDIOGRAM COMPLETE ? ?Result Date: 06/05/2021 ?   ECHOCARDIOGRAM REPORT   Patient Name:   [name] Date of Exam: 06/05/2021 Medical Rec #:  [id number]          Height:       68.0 in Accession #:    [id number]         Weight:       248.2 lb Date of  Birth:  May 22, 1965           BSA:          2.240 m? Patient Age:    56 years           BP:           181/159 mmHg Patient Gender: F                  HR:           105 bpm. Exam Location:  Inpatient Procedure: 2D Echo, Cardiac Doppler, Color Doppler and Intracardiac            Opacification Agent Indications:    Atrial fibrillation  History:        Patient has prior history of Echocardiogram examinations, most                 recent 11/25/2020. CAD; Risk Factors:Hypertension, Diabetes and                 Dyslipidemia. ESRD.  Sonographer:    Clayton Lefort RDCS (AE) Referring Phys: [phone] A CALDWELL POWELL JR  Sonographer Comments: Technically challenging study due to limited acoustic windows, Technically difficult study due to poor echo windows and patient is morbidly obese. Image acquisition challenging due to patient body habitus. Extremely challenging exam. 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 Patient moving thoughout exam. IMPRESSIONS  1. Left ventricular ejection fraction, by estimation, is 60 to 65%. The left ventricle has normal function. The left ventricle has no regional wall motion abnormalities. There is moderate left ventricular hypertrophy. Left ventricular diastolic function  could not be evaluated. There is the interventricular septum is flattened in systole, consistent with right ventricular pressure overload.  2. Right ventricular systolic function is mildly reduced. The right ventricular size is mildly enlarged. There is severely elevated pulmonary artery systolic pressure.  3. The mitral valve is grossly normal. Mild mitral valve regurgitation.  4. Tricuspid valve regurgitation is moderate.  5. Aortic valve regurgitation is not visualized.  6. Aortic no significant aortic aneurysm. Conclusion(s)/Recommendation(s): RVSP has increased from prior study. FINDINGS  Left Ventricle: Left ventricular ejection fraction, by estimation, is 60 to 65%. The left ventricle  has normal function. The left ventricle has no regional wall motion abnormalities. Definity contrast agent was given IV to delineate the left ventricular  endocardial borders. The left ventricular internal  cavity size was normal in size. There is moderate left ventricular hypertrophy. The interventricular septum is flattened in systole, consistent with right ventricular pressure overload. Left ventricular  diastolic function could not be evaluated. Right Ventricle: The right ventricular size is mildly enlarged. Right ventricular systolic function is mildly reduced. There is severely elevated pulmonary artery systolic pressure. The tricuspid regurgitant velocity is 3.77 m/s, and with an assumed right atrial pressure of 15 mmHg, the estimated right ventricular systolic pressure is 30.8 mmHg. Left Atrium: Left atrial size was not well visualized. Right Atrium: Right atrial size was not well visualized. Pericardium: There is no evidence of pericardial effusion. Mitral Valve: The mitral valve is grossly normal. Mild mitral valve regurgitation. Tricuspid Valve: The tricuspid valve is grossly normal. Tricuspid valve regurgitation is moderate. Aortic Valve: Aortic valve regurgitation is not visualized. Aortic valve mean gradient measures 9.0 mmHg. Aortic valve peak gradient measures 17.3 mmHg. Aortic valve area, by VTI measures 1.73 cm?. Pulmonic Valve: Pulmonic valve regurgitation is not visualized. Aorta: No significant aortic aneurysm. IAS/Shunts: The interatrial septum was not well visualized.  LEFT VENTRICLE PLAX 2D LVIDd:         4.30 cm LVIDs:         2.30 cm LV PW:         1.70 cm LV IVS:        1.20 cm LVOT diam:     2.00 cm LV SV:         53 LV SV Index:   24 LVOT Area:     3.14 cm?  RIGHT VENTRICLE             IVC RV Basal diam:  3.70 cm     IVC diam: 3.20 cm RV Mid diam:    4.10 cm RV S prime:     13.60 cm/s TAPSE (M-mode): 1.7 cm LEFT ATRIUM           Index        RIGHT ATRIUM           Index LA diam:      4.50  cm 2.01 cm/m?   RA Area:     20.20 cm? LA Vol (A2C): 60.7 ml 27.09 ml/m?  RA Volume:   56.80 ml  25.35 ml/m? LA Vol (A4C): 65.9 ml 29.42 ml/m?  AORTIC VALVE AV Area (Vmax):    1.60 cm? AV Area (Vmean):   1

## 2021-06-07 NOTE — Progress Notes (Signed)
ANTICOAGULATION CONSULT NOTE ? ?Pharmacy Consult for heparin ?Indication: atrial fibrillation ? ?Allergies  ?Allergen Reactions  ? Bupropion Itching  ? Dilaudid [Hydromorphone] Other (See Comments)  ?  Apnea, required intubation  ? ? ?Patient Measurements: ?Height: 5' 8"  (172.7 cm) ?Weight: 117.5 kg (259 lb 0.7 oz) ?IBW/kg (Calculated) : 63.9 ?Heparin Dosing Weight: 93kg ? ?Vital Signs: ?Temp: 97.5 ?F (36.4 ?C) (05/07 1600) ?Temp Source: Oral (05/07 1600) ?BP: 150/61 (05/07 1600) ? ?Labs: ?Recent Labs  ?  06/05/21 ?0210 06/05/21 ?1640 06/06/21 ?0623 06/06/21 ?1609 06/07/21 ?0142 06/07/21 ?1120 06/07/21 ?2106  ?HGB 7.5* 8.0* 7.6*  --  7.4*  --   --   ?HCT 24.0* 24.9* 24.6*  --  24.2*  --   --   ?PLT 259  --  260  --  266  --   --   ?HEPARINUNFRC  --   --  <0.10*   < > <0.10* 0.19* 0.24*  ?CREATININE 6.32*  --  4.67*  --  5.60*  --   --   ? < > = values in this interval not displayed.  ? ? ? ?Estimated Creatinine Clearance: 15.1 mL/min (A) (by C-G formula based on SCr of 5.6 mg/dL (H)). ? ? ?Medical History: ?Past Medical History:  ?Diagnosis Date  ? Anaphylactic shock, unspecified, initial encounter 09/04/2018  ? Anemia   ? Anxiety   ? CAD (coronary artery disease)   ? Nonobstructive on CT 2019  ? CHF (congestive heart failure) ([place])   ? Chronic bilateral pleural effusions 10/23/2018  ? COVID-19 10/2018  ? COVID-19 virus detected 10/10/2018  ? COVID-19 virus infection 09/18/2018  ? ESRD (end stage renal disease) ([place])   ? Dialysis TTHSat- 3rd st  ? Gangrene ([place]) 09/18/2018  ? Headache(784.0)   ? High cholesterol   ? History of blood transfusion   ? Hypertension   ? Mitral regurgitation   ? moderate to severe MR 10/2018 echo  ? Nerve pain   ? "they say I have L5 nerve damage; my lumbar"  ? Neuropathy 01/29/2011  ? 04/2011 MRI L-spine:  L5-S1: Bulge/shallow broad-based protrusion greatest centrally and in the right posterior lateral position. Minimal crowding of the  upper aspect of the S1 nerve root greater on the  right. Left lateral disc osteophyte with mild encroachment upon but not  significant compression of the exiting left L5 nerve root.   05/2011: Dr. Sherwood Gambler ([facility]): DJD and mild disc bu  ? Pericardial effusion   ? Pneumonia   ? ; 11/16/2018- "touch of pneumonia" - seen in ED- 11/14/2018- on antibiotic. Has had it x 2  ? Pneumonia 10/10/2018  ? PVD (peripheral vascular disease) ([place])   ? Right leg stent in [place].  (No records)  ? Restless legs   ? Rheumatoid arthritis ([place])   ? "knees" "hands", "RA"  ? Sleep apnea   ? does not use Cpap  ? Thoracic ascending aortic aneurysm ([place])   ? 4.4 cm 11/14/18 CTA  ? Type II diabetes mellitus ([place])   ? Uterine leiomyoma 01/10/2021  ? ? ?Medications:  ?Medications Prior to Admission  ?Medication Sig Dispense Refill Last Dose  ? acetaminophen (TYLENOL) 500 MG tablet Take 500-1,000 mg by mouth daily as needed for headache (pain).   Past Week  ? Ascorbic Acid (VITAMIN C PO) Take 1 tablet by mouth See admin instructions. At dialysis Monday,Wednesday and friday   Past Week  ? AURYXIA 1 GM 210 MG(Fe) tablet Take 420-840 mg by mouth See  admin instructions. 840 mg twice daily with meals, occasionally take 420 mg with a snack   Past Week  ? b complex-vitamin c-folic acid (NEPHRO-VITE) 0.8 MG TABS tablet Take 1 tablet by mouth every Monday, Wednesday, and Friday with hemodialysis.   Past Week  ? HUMIRA PEN 40 MG/0.4ML PNKT Inject 40 mg into the skin every 14 (fourteen) days.   Past Week  ? hydrOXYzine (ATARAX/VISTARIL) 25 MG tablet Take 25 mg by mouth every morning.   Past Week  ? Insulin Lispro Prot & Lispro (HUMALOG 75/25 MIX) (75-25) 100 UNIT/ML Kwikpen Inject 35 Units into the skin 2 (two) times daily with a meal. (Patient taking differently: Inject 35 Units into the skin in the morning and at bedtime.) 45 mL 3 Past Week  ? Methoxy PEG-Epoetin Beta (MIRCERA IJ) Dialysis Monday,Wednesday and friday   Past Week  ? metoprolol succinate (TOPROL-XL) 100 MG 24 hr tablet Take  100 mg by mouth daily.   Past Week at unk  ? Multiple Vitamins-Minerals (ZINC PO) Take 1 tablet by mouth every morning.   Past Week  ? OVER THE COUNTER MEDICATION Take 1 capsule by mouth every morning. Omega XL   Past Month  ? oxyCODONE-acetaminophen (PERCOCET) 10-325 MG tablet Take 1 tablet by mouth 3 (three) times daily as needed for pain.   Past Week  ? pramipexole (MIRAPEX) 0.5 MG tablet Take 0.5 mg by mouth daily.   1 Past Week  ? rosuvastatin (CRESTOR) 40 MG tablet TAKE 1 TABLET BY MOUTH DAILY *PATIENT NEEDS APPOINTMENT* (Patient taking differently: Take 40 mg by mouth daily.) 30 tablet 10 Past Week  ? albuterol (VENTOLIN HFA) 108 (90 Base) MCG/ACT inhaler Inhale 2 puffs into the lungs every 6 (six) hours as needed for wheezing or shortness of breath. (Patient not taking: Reported on 05/27/2021) 6.7 g 0 Not Taking  ? glucose blood (ONETOUCH VERIO) test strip 1 each by Other route 2 (two) times daily. And lancets 2/day 200 each 3   ? NARCAN 4 MG/0.1ML LIQD nasal spray kit Place 1 spray into the nose as needed (accidental overdose.).  (Patient not taking: Reported on 05/27/2021)   Not Taking  ? ?Scheduled:  ? (feeding supplement) PROSource Plus  30 mL Oral BID BM  ? sodium chloride   Intravenous Once  ? sodium chloride   Intravenous Once  ? Chlorhexidine Gluconate Cloth  6 each Topical Q0600  ? clopidogrel  75 mg Oral Q breakfast  ? docusate sodium  100 mg Oral Daily  ? insulin aspart  0-5 Units Subcutaneous QHS  ? insulin aspart  0-6 Units Subcutaneous TID WC  ? insulin aspart protamine- aspart  28 Units Subcutaneous BID WC  ? metoprolol tartrate  12.5 mg Oral BID  ? pantoprazole  40 mg Oral Daily  ? pramipexole  0.125 mg Oral QHS  ? rosuvastatin  40 mg Oral Daily  ? sodium chloride flush  3 mL Intravenous Q12H  ? sodium chloride flush  3 mL Intravenous Q12H  ? ?Infusions:  ? sodium chloride    ? sodium chloride    ? sodium chloride    ? [START ON 06/08/2021] ceFEPime (MAXIPIME) IV    ? heparin 2,350 Units/hr  (06/07/21 1317)  ? ? ?Assessment: ?Pt with multiple medical issues who is s/p BKA. Heparin has been ordered for her afib. Holding off DOAC right now because of high risk for bleeding. She has received multiple PRBCs.  ? ?Heparin level tonight trending up but remains below goal  at 0.24. ? ?Goal of Therapy:  ?Heparin level 0.3-0.5 units/ml ?Monitor platelets by anticoagulation protocol: Yes ?  ?Plan:  ?Increase heparin to 2350 units/h ?Recheck heparin level with am labs ? ?Arrie Senate, PharmD, BCPS, BCCP ?Clinical Pharmacist ?[phone] ?Please check AMION for all [place] numbers ?06/07/2021 ? ? ? ? ?

## 2021-06-08 ENCOUNTER — Inpatient Hospital Stay (HOSPITAL_COMMUNITY): Payer: Medicare Other

## 2021-06-08 ENCOUNTER — Other Ambulatory Visit (HOSPITAL_COMMUNITY): Payer: Self-pay

## 2021-06-08 DIAGNOSIS — G9341 Metabolic encephalopathy: Secondary | ICD-10-CM

## 2021-06-08 LAB — CBC
HCT: 26.2 % — ABNORMAL LOW (ref 36.0–46.0)
Hemoglobin: 7.8 g/dL — ABNORMAL LOW (ref 12.0–15.0)
MCH: 29.3 pg (ref 26.0–34.0)
MCHC: 29.8 g/dL — ABNORMAL LOW (ref 30.0–36.0)
MCV: 98.5 fL (ref 80.0–100.0)
Platelets: 261 10*3/uL (ref 150–400)
RBC: 2.66 MIL/uL — ABNORMAL LOW (ref 3.87–5.11)
RDW: 16.3 % — ABNORMAL HIGH (ref 11.5–15.5)
WBC: 9.7 10*3/uL (ref 4.0–10.5)
nRBC: 1.1 % — ABNORMAL HIGH (ref 0.0–0.2)

## 2021-06-08 LAB — RENAL FUNCTION PANEL
Albumin: 2.6 g/dL — ABNORMAL LOW (ref 3.5–5.0)
Anion gap: 16 — ABNORMAL HIGH (ref 5–15)
BUN: 52 mg/dL — ABNORMAL HIGH (ref 6–20)
CO2: 22 mmol/L (ref 22–32)
Calcium: 8.6 mg/dL — ABNORMAL LOW (ref 8.9–10.3)
Chloride: 93 mmol/L — ABNORMAL LOW (ref 98–111)
Creatinine, Ser: 7.01 mg/dL — ABNORMAL HIGH (ref 0.44–1.00)
GFR, Estimated: 6 mL/min — ABNORMAL LOW (ref >60)
Glucose, Bld: 135 mg/dL — ABNORMAL HIGH (ref 70–99)
Phosphorus: 4.9 mg/dL — ABNORMAL HIGH (ref 2.5–4.6)
Potassium: 5 mmol/L (ref 3.5–5.1)
Sodium: 131 mmol/L — ABNORMAL LOW (ref 135–145)

## 2021-06-08 LAB — GLUCOSE, CAPILLARY
Glucose-Capillary: 161 mg/dL — ABNORMAL HIGH (ref 70–99)
Glucose-Capillary: 103 mg/dL — ABNORMAL HIGH (ref 70–99)
Glucose-Capillary: 170 mg/dL — ABNORMAL HIGH (ref 70–99)
Glucose-Capillary: 214 mg/dL — ABNORMAL HIGH (ref 70–99)

## 2021-06-08 LAB — BLOOD GAS, VENOUS
Acid-Base Excess: 6.4 mmol/L — ABNORMAL HIGH (ref 0.0–2.0)
Bicarbonate: 31.9 mmol/L — ABNORMAL HIGH (ref 20.0–28.0)
Drawn by: 6101
O2 Saturation: 43.7 %
Patient temperature: 36.5
pCO2, Ven: 47 mmHg (ref 44–60)
pH, Ven: 7.44 — ABNORMAL HIGH (ref 7.25–7.43)
pO2, Ven: <31 mmHg — CL (ref 32–45)

## 2021-06-08 LAB — HEPARIN LEVEL (UNFRACTIONATED): Heparin Unfractionated: 0.31 IU/mL (ref 0.30–0.70)

## 2021-06-08 MED ORDER — DIPHENHYDRAMINE HCL 25 MG PO CAPS
25.0000 mg | ORAL_CAPSULE | Freq: Once | ORAL | Status: AC | PRN
  Administered 2021-06-08: 25 mg via ORAL

## 2021-06-08 MED ORDER — OXYCODONE HCL 5 MG PO TABS: 10.0000 mg | ORAL_TABLET | ORAL | Status: DC | PRN

## 2021-06-08 MED ORDER — APIXABAN 5 MG PO TABS
5.0000 mg | ORAL_TABLET | Freq: Two times a day (BID) | ORAL | Status: DC
  Administered 2021-06-08 – 2021-06-11 (×6): 5 mg via ORAL
  Filled 2021-06-08 (×8): qty 1

## 2021-06-08 MED ORDER — LORAZEPAM 0.5 MG PO TABS: 0.5000 mg | ORAL_TABLET | Freq: Once | ORAL | Status: DC | PRN

## 2021-06-08 MED ORDER — OXYCODONE HCL 5 MG PO TABS: 5.0000 mg | ORAL_TABLET | ORAL | Status: DC | PRN

## 2021-06-08 MED ORDER — DIPHENHYDRAMINE HCL 25 MG PO CAPS
25.0000 mg | ORAL_CAPSULE | Freq: Four times a day (QID) | ORAL | Status: DC | PRN
  Administered 2021-06-08 – 2021-06-10 (×4): 25 mg via ORAL
  Filled 2021-06-08 (×6): qty 1

## 2021-06-08 NOTE — Discharge Instructions (Addendum)

## 2021-06-08 NOTE — Progress Notes (Signed)
ANTICOAGULATION CONSULT NOTE- follow-up ? ?Pharmacy Consult for heparin ?Indication: atrial fibrillation ? ?Allergies  ?Allergen Reactions  ? Bupropion Itching  ? Dilaudid [Hydromorphone] Other (See Comments)  ?  Apnea, required intubation  ? ? ?Patient Measurements: ?Height: _0  (172.7 cm) ?Weight: 116.2 kg (256 lb 2.8 oz) ?IBW/kg (Calculated) : 63.9 ?Heparin Dosing Weight: 93kg ? ?Vital Signs: ?Temp: 97.7 ?F (36.5 ?C) (05/08 1607) ?Temp Source: Oral (05/08 0319) ?BP: 117/71 (05/08 0742) ?Pulse Rate: 75 (05/08 0742) ? ?Labs: ?Recent Labs  ?  06/06/21 ?3710 06/06/21 ?1609 06/07/21 ?0142 06/07/21 ?1120 06/07/21 ?2106 06/08/21 ?0300  ?HGB 7.6*  --  7.4*  --   --  7.8*  ?HCT 24.6*  --  24.2*  --   --  26.2*  ?PLT 260  --  266  --   --  261  ?HEPARINUNFRC <0.10*   < > <0.10* 0.19* 0.24* 0.31  ?CREATININE 4.67*  --  5.60*  --   --  7.01*  ? < > = values in this interval not displayed.  ? ? ? ?Estimated Creatinine Clearance: 12 mL/min (A) (by C-G formula based on SCr of 7.01 mg/dL (H)). ? ? ?Medical History: ?Past Medical History:  ?Diagnosis Date  ? Anaphylactic shock, unspecified, initial encounter 09/04/2018  ? Anemia   ? Anxiety   ? CAD (coronary artery disease)   ? Nonobstructive on CT 2019  ? CHF (congestive heart failure) ([place])   ? Chronic bilateral pleural effusions 10/23/2018  ? COVID-19 10/2018  ? COVID-19 virus detected 10/10/2018  ? COVID-19 virus infection 09/18/2018  ? ESRD (end stage renal disease) ([place])   ? Dialysis TTHSat- 3rd st  ? Gangrene ([place]) 09/18/2018  ? Headache(784.0)   ? High cholesterol   ? History of blood transfusion   ? Hypertension   ? Mitral regurgitation   ? moderate to severe MR 10/2018 echo  ? Nerve pain   ? "they say I have L5 nerve damage; my lumbar"  ? Neuropathy 01/29/2011  ? 04/2011 MRI L-spine:  L5-S1: Bulge/shallow broad-based protrusion greatest centrally and in the right posterior lateral position. Minimal crowding of the  upper aspect of the S1 nerve root greater on the  right. Left lateral disc osteophyte with mild encroachment upon but not  significant compression of the exiting left L5 nerve root.   05/2011: Dr. Sherwood Gambler ([facility]): DJD and mild disc bu  ? Pericardial effusion   ? Pneumonia   ? ; 11/16/2018- "touch of pneumonia" - seen in ED- 11/14/2018- on antibiotic. Has had it x 2  ? Pneumonia 10/10/2018  ? PVD (peripheral vascular disease) ([place])   ? Right leg stent in [place].  (No records)  ? Restless legs   ? Rheumatoid arthritis ([place])   ? "knees" "hands", "RA"  ? Sleep apnea   ? does not use Cpap  ? Thoracic ascending aortic aneurysm ([place])   ? 4.4 cm 11/14/18 CTA  ? Type II diabetes mellitus ([place])   ? Uterine leiomyoma 01/10/2021  ? ? ?Medications:  ?Medications Prior to Admission  ?Medication Sig Dispense Refill Last Dose  ? acetaminophen (TYLENOL) 500 MG tablet Take 500-1,000 mg by mouth daily as needed for headache (pain).   Past Week  ? Ascorbic Acid (VITAMIN C PO) Take 1 tablet by mouth See admin instructions. At dialysis Monday,Wednesday and friday   Past Week  ? AURYXIA 1 GM 210 MG(Fe) tablet Take 420-840 mg by mouth See admin instructions. 840 mg twice daily with meals, occasionally  take 420 mg with a snack   Past Week  ? b complex-vitamin c-folic acid (NEPHRO-VITE) 0.8 MG TABS tablet Take 1 tablet by mouth every Monday, Wednesday, and Friday with hemodialysis.   Past Week  ? HUMIRA PEN 40 MG/0.4ML PNKT Inject 40 mg into the skin every 14 (fourteen) days.   Past Week  ? hydrOXYzine (ATARAX/VISTARIL) 25 MG tablet Take 25 mg by mouth every morning.   Past Week  ? Insulin Lispro Prot & Lispro (HUMALOG 75/25 MIX) (75-25) 100 UNIT/ML Kwikpen Inject 35 Units into the skin 2 (two) times daily with a meal. (Patient taking differently: Inject 35 Units into the skin in the morning and at bedtime.) 45 mL 3 Past Week  ? Methoxy PEG-Epoetin Beta (MIRCERA IJ) Dialysis Monday,Wednesday and friday   Past Week  ? metoprolol succinate (TOPROL-XL) 100 MG 24 hr tablet Take  100 mg by mouth daily.   Past Week at unk  ? Multiple Vitamins-Minerals (ZINC PO) Take 1 tablet by mouth every morning.   Past Week  ? OVER THE COUNTER MEDICATION Take 1 capsule by mouth every morning. Omega XL   Past Month  ? oxyCODONE-acetaminophen (PERCOCET) 10-325 MG tablet Take 1 tablet by mouth 3 (three) times daily as needed for pain.   Past Week  ? pramipexole (MIRAPEX) 0.5 MG tablet Take 0.5 mg by mouth daily.   1 Past Week  ? rosuvastatin (CRESTOR) 40 MG tablet TAKE 1 TABLET BY MOUTH DAILY *PATIENT NEEDS APPOINTMENT* (Patient taking differently: Take 40 mg by mouth daily.) 30 tablet 10 Past Week  ? albuterol (VENTOLIN HFA) 108 (90 Base) MCG/ACT inhaler Inhale 2 puffs into the lungs every 6 (six) hours as needed for wheezing or shortness of breath. (Patient not taking: Reported on 05/27/2021) 6.7 g 0 Not Taking  ? glucose blood (ONETOUCH VERIO) test strip 1 each by Other route 2 (two) times daily. And lancets 2/day 200 each 3   ? NARCAN 4 MG/0.1ML LIQD nasal spray kit Place 1 spray into the nose as needed (accidental overdose.).  (Patient not taking: Reported on 05/27/2021)   Not Taking  ? ?Scheduled:  ? (feeding supplement) PROSource Plus  30 mL Oral BID BM  ? sodium chloride   Intravenous Once  ? sodium chloride   Intravenous Once  ? Chlorhexidine Gluconate Cloth  6 each Topical Q0600  ? clopidogrel  75 mg Oral Q breakfast  ? docusate sodium  100 mg Oral Daily  ? insulin aspart  0-5 Units Subcutaneous QHS  ? insulin aspart  0-6 Units Subcutaneous TID WC  ? insulin aspart protamine- aspart  28 Units Subcutaneous BID WC  ? metoprolol tartrate  12.5 mg Oral BID  ? pantoprazole  40 mg Oral Daily  ? pramipexole  0.125 mg Oral QHS  ? rosuvastatin  40 mg Oral Daily  ? sodium chloride flush  3 mL Intravenous Q12H  ? sodium chloride flush  3 mL Intravenous Q12H  ? ?Infusions:  ? sodium chloride    ? sodium chloride    ? sodium chloride    ? ceFEPime (MAXIPIME) IV    ? heparin 2,500 Units/hr (06/08/21 0116)   ? ? ?Assessment: ?Pt with multiple medical issues who is s/p BKA. Heparin has been ordered for her afib. Holding off DOAC right now because of high risk for bleeding. She has received multiple PRBCs.  ? ?Heparin level this morning therapeutic at 0.31 ? ?Goal of Therapy:  ?Heparin level 0.3-0.5 units/ml ?Monitor platelets by anticoagulation protocol:  Yes ?  ?Plan:  ?Continue heparin at 2500 units/h ?Recheck a confirmatory heparin level at 10 AM ? ?Genevia Bouldin BS, PharmD, BCPS ?Clinical Pharmacist ?06/08/2021 8:02 AM ? ?Contact: [phone] after 3 PM ? ?"Be curious, not judgmental..." -Jamal Maes ? ? ? ? ?

## 2021-06-08 NOTE — Progress Notes (Signed)
Bipap set up in pt room per physician order. Pt educated on importance of pap and mask fit. Verbalizes understanding. Pt currently eating lunch. Will place on pt 1hr after eating. RN aware. RT will continue to monitor and be available as needed.  ?

## 2021-06-08 NOTE — Progress Notes (Signed)
Physical Therapy Treatment ?Patient Details ?Name: [name] ?MRN: [id number] ?DOB: 07/01/1965 ?Today's Date: 06/08/2021 ? ? ?History of Present Illness 56 y.o. female presented 05/25/21 with infected L 5th toe. S/p L leg aortogram, angiogram, laser atherectomy of the anterior tibial artery, and balloon angioplasty of anterior tibial artery 4/26. 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 PMH: hypertension, diabetes, hyperlipidemia, CHF, ESRD on HD, chronic pain, CAD, anemia, Buerger disease, rheumatoid arthritis ? ?  ?PT Comments  ? ? Pt received in supine, sleeping with bipap on and able to be awoken with effort/increased time/ RN present to remove bipap. Pt desat to 88% then 84% on room air after transition to edge of bed from supine so pt replaced on 2L O2 [place] and SpO2 WFL within 2 minutes with cues for pursed-lip breathing. Pt performed sit<>stand successfully from elevated bed<>Stedy on 2 of 4 attempts with +2 maxA and needs minA for anterior/posterior scooting at EOB/in chair and for bed mobility. Pt requiring greatly increased time to initiate/perform all mobility tasks today due to lethargy, she was more alert once attempting standing transfers. Pt given HEP handout (link: [email or web address] Access Code: [email or web address]) after transfer to chair and noted to be falling asleep again, RN notified. Pt sister Mardene Celeste present and encouraging during session. Pt continues to benefit from PT services to progress toward functional mobility goals. Continue to recommend short term low to moderate intensity post-acute rehab as per chart review high intensity post-acute rehab not available due to insurance denial.  ?Recommendations for follow up therapy are one component of a multi-disciplinary discharge planning process, led by the attending physician.  Recommendations may be updated based on patient status, additional functional criteria and insurance authorization. ? ?Follow Up  Recommendations ? Skilled nursing-short term rehab (<3 hours/day) (would recommend AIR but insurance won't approve) ?  ?  ?Assistance Recommended at Discharge Frequent or constant Supervision/Assistance  ?Patient can return home with the following Two people to help with walking and/or transfers;A lot of help with bathing/dressing/bathroom;Assistance with cooking/housework;Assist for transportation;Help with stairs or ramp for entrance ?  ?Equipment Recommendations ? BSC/3in1;Rolling walker (2 wheels) (drop-arm bedside commode; current RW is broken)  ?  ?Recommendations for Other Services   ? ? ?  ?Precautions / Restrictions Precautions ?Precautions: Fall ?Precaution Comments: new L BKA 06/02/21 ?Required Braces or Orthoses: Other Brace ?Other Brace: limb guard ?Restrictions ?Weight Bearing Restrictions: Yes ?LLE Weight Bearing: Non weight bearing  ?  ? ?Mobility ? Bed Mobility ?Overal bed mobility: Needs Assistance ?Bed Mobility: Supine to Sit ?  ?  ?Supine to sit: Min assist, HOB elevated ?  ?  ?General bed mobility comments: pt likely requiring increased assist today due to lethargy; max cues for technique/use of bed rails to perform ?  ? ?Transfers ?Overall transfer level: Needs assistance ?Equipment used: Ambulation equipment used, Sliding board ?Transfers: Sit to/from Stand, Bed to chair/wheelchair/BSC ?Sit to Stand: Max assist, From elevated surface, +2 physical assistance, +2 safety/equipment ?  ?  ?  ?  ?  ?General transfer comment: Charlaine Dalton (pt able to stand on third attempt from elevated bed height) with +2 assist using bed pad/gait belt and max cues for anterior lean; pillow in front of RLE for improved comfort/knee block once pt standing; third person present for stand>sit safety to move Stedy flaps out of her way upon standing from [place] and maxA +2 for safety to lower back into recliner. NT/RN notified to have per perform slide board or mechanical  lift transfer back to bed once ready to return from  chair. ?  ? ?Ambulation/Gait ?  ?  ?  ?  ?  ?  ?  ?General Gait Details: deferred ? ? ?  ?Balance Overall balance assessment: Needs assistance ?Sitting-balance support: Bilateral upper extremity supported, Feet supported, No upper extremity supported ?Sitting balance-Leahy Scale: Fair ?Sitting balance - Comments: Able to sit statically without UE support or LOB but tends to prefer UE support ?  ?Standing balance support: Bilateral upper extremity supported ?Standing balance-Leahy Scale: Zero ?Standing balance comment: MaxA, R knee block with Stedy frame, stands <15 sec ?  ?  ?  ?  ?  ?  ?  ?  ?  ?  ?  ?  ? ?  ?Cognition Arousal/Alertness: Lethargic ?Behavior During Therapy: [facility] for tasks assessed/performed ?Overall Cognitive Status: Within Functional Limits for tasks assessed ?  ?  ?  ?  ?  ?  ?  ?  ?  ?  ?  ?  ?  ?  ?  ?  ?General Comments: increased time/effort to awaken pt (pt wearing Bipap when PTA entered room and RN entered room to assist her to doff it), pt still drowsy while seated EOB but able to maintain eyes open more during sit<>stand trials at [facility] and once up in recliner. Pt appears somewhat HoH also. Pt sister ?  ?  ? ?  ?Exercises Amputee Exercises ?Quad Sets: AROM, Seated (3 reps with vcs for technique and handout given) ?Gluteal Sets: AROM (3 reps with vcs for technique and handout given) ?Hip ABduction/ADduction: AAROM, Both, 5 reps, Other (comment) (reclined) ?Hip Flexion/Marching: AROM, Both, 5 reps, Other (comment) (reclined) ?Chair Push Up: AROM, Seated, 5 reps (x5 attempts while seated in chair with cues for UE activation/technique; pt unable to lift hips off of chair seat when attempting) ?Other Exercises ?Other Exercises: visual/verbal cues for pulling to long sit/sit-ups in bed (per HEP handout) with ? ?  ?General Comments General comments (skin integrity, edema, etc.): SpO2 88% with transfer to EOB on RA, then desat to 84% after sitting ~3 mins, so replaced on 2L O2 [place] and SpO2 improved  to 96% within 2 minutes. HR 80's bpm resting pre/post exertion. ?  ?  ? ?Pertinent Vitals/Pain Pain Assessment ?Pain Assessment: Faces ?Faces Pain Scale: Hurts little more ?Pain Location: L residual limb ?Pain Descriptors / Indicators: Discomfort, Grimacing, Operative site guarding ?Pain Intervention(s): Monitored during session, Repositioned  ? ? ? ?PT Goals (current goals can now be found in the care plan section) Acute Rehab PT Goals ?Patient Stated Goal: to get better ?PT Goal Formulation: With patient ?Time For Goal Achievement: 06/18/21 ?Progress towards PT goals: Progressing toward goals ? ?  ?Frequency ? ? ? Min 3X/week ? ? ? ?  ?PT Plan Current plan remains appropriate  ? ? ?   ?AM-PAC PT "6 Clicks" Mobility   ?Outcome Measure ? Help needed turning from your back to your side while in a flat bed without using bedrails?: A Little ?Help needed moving from lying on your back to sitting on the side of a flat bed without using bedrails?: A Little ?Help needed moving to and from a bed to a chair (including a wheelchair)?: A Lot ?Help needed standing up from a chair using your arms (e.g., wheelchair or bedside chair)?: A Lot ?Help needed to walk in hospital room?: Total ?Help needed climbing 3-5 steps with a railing? : Total ?6 Click Score: 12 ? ?  ?  End of Session Equipment Utilized During Treatment: Gait belt;Oxygen ?Activity Tolerance: Patient tolerated treatment well ?Patient left: with call bell/phone within reach;in chair;with chair alarm set;with family/visitor present ?Nurse Communication: Mobility status;Need for lift equipment;Other (comment) (use slide board vs mechanical lift (pad under her in chair) for safe transfer back to bed; instruction on safe tehcnique for scoot tf) ?PT Visit Diagnosis: Unsteadiness on feet (R26.81);Muscle weakness (generalized) (M62.81);History of falling (Z91.81);Difficulty in walking, not elsewhere classified (R26.2);Pain ?Pain - Right/Left: Left ?Pain - part of body: Leg ?   ? ? ?Time: 5638-7564 ?PT Time Calculation (min) (ACUTE ONLY): 50 min ? ?Charges:  $Therapeutic Exercise: 8-22 mins ?$Therapeutic Activity: 38-52 mins          ?          ? ?Jeffrie Stander P., PTA ?Acute Rehabilitation Services ?

## 2021-06-08 NOTE — Progress Notes (Signed)
?[name] ?Progress Note  ? ?Subjective:    ?Seen and examined patient at bedside. C/o pain and anxiety but also falls to sleep easily.  No c/o related to HD.  ? ?Objective ?Vitals:  ? 06/07/21 2342 06/08/21 9476 06/08/21 5465 06/08/21 0354  ?BP: 121/64 119/63 114/66 136/72  ?Pulse:  (!) 30 65 72  ?Resp: 20 (!) 22 (!) 23 20  ?Temp: 97.7 ?F (36.5 ?C) 97.7 ?F (36.5 ?C) 97.7 ?F (36.5 ?C)   ?TempSrc: Oral Oral    ?SpO2: 100% 100%    ?Weight:  120.4 kg 116.2 kg   ?Height:      ? ?Physical Exam ?General: chronically ill appearing, NAD ?Heart: RRR, no mrg ?Lungs: CTAB, nml WOB ?Abdomen: soft, NTND ?Extremities: trace LE edema, woody appearance. L BKA  ?Dialysis Access: RU AVF +b/t  ? ?Filed Weights  ? 06/07/21 0600 06/08/21 0319 06/08/21 0646  ?Weight: 117.5 kg 120.4 kg 116.2 kg  ? ?No intake or output data in the 24 hours ending 06/08/21 0736 ? ?Additional Objective ?Labs: ?Basic Metabolic Panel: ?Recent Labs  ?Lab 06/06/21 ?6568 06/07/21 ?0142 06/08/21 ?0300  ?NA 135 135 131*  ?K 4.1 4.3 5.0  ?CL 96* 97* 93*  ?CO2 '25 23 22  '$ ?GLUCOSE 95 120* 135*  ?BUN 29* 40* 52*  ?CREATININE 4.67* 5.60* 7.01*  ?CALCIUM 8.4* 8.6* 8.6*  ?PHOS 3.2 3.8 4.9*  ? ? ?Liver Function Tests: ?Recent Labs  ?Lab 06/05/21 ?0210 06/06/21 ?1275 06/07/21 ?0142 06/08/21 ?0300  ?AST 42* 76* 128*  --   ?ALT 24 36 66*  --   ?ALKPHOS 183* 178* 199*  --   ?BILITOT 0.6 0.6 0.7  --   ?PROT 7.6 7.7 7.4  --   ?ALBUMIN 2.6* 2.6* 2.5* 2.6*  ? ? ?No results for input(s): LIPASE, AMYLASE in the last 168 hours. ?CBC: ?Recent Labs  ?Lab 06/04/21 ?0203 06/04/21 ?1437 06/05/21 ?0210 06/05/21 ?1640 06/06/21 ?1700 06/07/21 ?0142 06/08/21 ?0300  ?WBC 12.9*  --  11.6*  --  9.8 9.4 9.7  ?NEUTROABS 10.3*  --  9.4*  --  7.5 6.6  --   ?HGB 7.6*   < > 7.5*   < > 7.6* 7.4* 7.8*  ?HCT 23.6*   < > 24.0*   < > 24.6* 24.2* 26.2*  ?MCV 94.8  --  94.5  --  95.0 96.4 98.5  ?PLT 238  --  259  --  260 266 261  ? < > = values in this interval not displayed.  ? ? ?Blood  Culture ?   ?Component Value Date/Time  ? SDES BLOOD BLOOD LEFT FOREARM 06/04/2021 1840  ? SPECREQUEST  06/04/2021 1840  ?  BOTTLES DRAWN AEROBIC ONLY Blood Culture results may not be optimal due to an inadequate volume of blood received in culture bottles  ? CULT  06/04/2021 1840  ?  NO GROWTH 3 DAYS ?Performed at [facility], [place] [address], [place], [place] [address] ?  ? REPTSTATUS PENDING 06/04/2021 1840  ? ? ?Cardiac Enzymes: ?No results for input(s): CKTOTAL, CKMB, CKMBINDEX, TROPONINI in the last 168 hours. ?CBG: ?Recent Labs  ?Lab 06/07/21 ?4967 06/07/21 ?1204 06/07/21 ?1612 06/07/21 ?2107 06/08/21 ?5916  ?GLUCAP 101* 92 127* 122* 161*  ? ? ?Iron Studies:  ?Recent Labs  ?  06/06/21 ?3846  ?IRON 40  ?TIBC 174*  ?FERRITIN >7,500*  ? ? ?Lab Results  ?Component Value Date  ? INR 1.0 11/14/2018  ? INR 0.90 01/30/2011  ? ?Studies/Results: ?  DG CHEST PORT 1 VIEW ? ?Result Date: 06/07/2021 ?CLINICAL DATA:  Shortness of breath. EXAM: PORTABLE CHEST 1 VIEW COMPARISON:  06/06/2021 FINDINGS: Lungs are hypoinflated with hazy perihilar and bibasilar opacification unchanged. This may be due to edema versus infection. Possible left pleural effusion unchanged. Stable cardiomegaly. Remainder of the exam is unchanged. IMPRESSION: 1. Stable hazy perihilar and bibasilar opacification which may be due to edema versus infection. Possible left pleural effusion unchanged. 2. Stable cardiomegaly. Electronically Signed   By: Marin Olp M.D.   On: 06/07/2021 14:42   ? ?Medications: ? sodium chloride    ? sodium chloride    ? sodium chloride    ? ceFEPime (MAXIPIME) IV    ? heparin 2,500 Units/hr (06/08/21 0116)  ? ? (feeding supplement) PROSource Plus  30 mL Oral BID BM  ? sodium chloride   Intravenous Once  ? sodium chloride   Intravenous Once  ? Chlorhexidine Gluconate Cloth  6 each Topical Q0600  ? clopidogrel  75 mg Oral Q breakfast  ? docusate sodium  100 mg Oral Daily  ? insulin aspart  0-5 Units Subcutaneous QHS  ?  insulin aspart  0-6 Units Subcutaneous TID WC  ? insulin aspart protamine- aspart  28 Units Subcutaneous BID WC  ? metoprolol tartrate  12.5 mg Oral BID  ? pantoprazole  40 mg Oral Daily  ? pramipexole  0.125 mg Oral QHS  ? rosuvastatin  40 mg Oral Daily  ? sodium chloride flush  3 mL Intravenous Q12H  ? sodium chloride flush  3 mL Intravenous Q12H  ? ? ?Dialysis Orders: ?MWF  - GOC ?4hrs, BFR 450, DFR 800,  EDW 110.6kg, 2K/ 2.5Ca, RU AVF,  ?-Heparin 10000 unit IV bolus TIW ? - No ESA 2/2 ?malignancy (no diagnosis - has stable renal lesion, pulm lesions -> saw onc 4/17, plan is for bronchoscopy with Bx of lung nodules in near future) ?- Calcitriol 2.5 mcg PO qHD ?- Parsabiv 7.5 mg IV qHD  ? 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  S/p balloon angioplasty and laser atherectomy to LATA 4/26, then amputation of L 4th/5th metarsals 05/28/2021 Dr. Donzetta Matters. 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 On IV Cefepime. ?Afib with RVR- new onset 5/6 after HD. Remains in SR. Continue Metoprolol and Heparin infusion for now. Per primary, may switch to Digoxin or Amiodarone if Afib persist or worsens. ?Volume overload:  Continue lowering volume as tolerated with HD.  ?ESRD: Continue HD per usual MWF schedule. HD 06/08/21. UFG 4 L.  ?Hypertension: BP variable. On metoprolol for rate control. Has had hypotension with HD, can utilizing midodrine with HD for BP support.   ?Anemia of ESRD: Hgb 7 on admit, s/p 5 units PRBCs total during hospitalization so far. Hb 7.8.  Follow trends. Not getting outpatient ESA d/t potential malignancy. Transfusion for Hgb <7.  ?Secondary Hyperparathyroidism: Ca/Phos ok. Continue VDRA on MWF.  Unable to give parsabiv not on formulary.  Continue binders. ?Nutrition - Renal diet w/fluid restrictions.  ?DMT2 - per PMD ? ?Jannifer Hick MD ?[facility] ?Pager [phone] ? ?  ?

## 2021-06-08 NOTE — Progress Notes (Addendum)
?  ? ? ?  Subjective  - very tired today after working with PT and having HD. ? ? ?Objective ?127/61 ?91 ?97.8 ?F (36.6 ?C) (Oral) ?(!) 24 ?93% ? ?Intake/Output Summary (Last 24 hours) at 06/08/2021 1708 ?Last data filed at 06/08/2021 1030 ?Gross per 24 hour  ?Intake --  ?Output 4000 ml  ?Net -4000 ml  ? ? ? ?Assessment/Planning: 56 year old female status post left below-knee amputation. ? ? ? ?Roxy Horseman ?06/08/2021 ?5:08 PM ?-- ? ?Laboratory ?Lab Results: ?Recent Labs  ?  06/07/21 ?0142 06/08/21 ?0300  ?WBC 9.4 9.7  ?HGB 7.4* 7.8*  ?HCT 24.2* 26.2*  ?PLT 266 261  ? ?BMET ?Recent Labs  ?  06/07/21 ?0142 06/08/21 ?0300  ?NA 135 131*  ?K 4.3 5.0  ?CL 97* 93*  ?CO2 23 22  ?GLUCOSE 120* 135*  ?BUN 40* 52*  ?CREATININE 5.60* 7.01*  ?CALCIUM 8.6* 8.6*  ? ? ?COAG ?Lab Results  ?Component Value Date  ? INR 1.0 11/14/2018  ? INR 0.90 01/30/2011  ? ?No results found for: PTT ? ? ? ? ?I have independently interviewed and examined patient and agree with PA assessment and plan above.  We will take down dressing tomorrow and evaluate below-knee amputation on the left.  She appears to be progressing well. ? ?Tawnya Pujol C. Donzetta Matters, MD ?[facility] ?Office: [phone] ?Pager: [phone] ? ? ?

## 2021-06-08 NOTE — TOC Progression Note (Signed)
Transition of Care (TOC) - Progression Note  ? ? ?Patient Details  ?Name: [name] ?MRN: [id number] ?Date of Birth: July 21, 1965 ? ?Transition of Care (TOC) CM/SW Contact  ?Vinie Sill, LCSW ?Phone Number: ?06/08/2021, 11:05 AM ? ?Clinical Narrative:    ? ?CSW spoke with [facility]/Admissions- they will review to determine if they can accommodate dialysis schedule and met her needs for CPAP. ? ?TOC will continue to follow and assist with discahrge plan. ? ?[name], MSW, LCSW ?Clinical Social Worker ? ? ? ? ?Expected Discharge Plan: [place] ?Barriers to Discharge: Insurance Authorization (waiting on confirmation of bed offer form [facility]) ? ?Expected Discharge Plan and Services ?Expected Discharge Plan: [place] ?In-house Referral: Clinical Social Work ?  ?  ?Living arrangements for the past 2 months: [place] ?                ?  ?  ?  ?  ?  ?  ?  ?  ?  ?  ? ? ?Social Determinants of Health (SDOH) Interventions ?  ? ?Readmission Risk Interventions ? ?  02/28/2020  ?  1:08 PM  ?Readmission Risk Prevention Plan  ?Transportation Screening Complete  ?PCP or Specialist Appt within 5-7 Days Complete  ?Home Care Screening Complete  ?Medication Review (RN CM) Complete  ? ? ?

## 2021-06-08 NOTE — Progress Notes (Signed)
RT in to assess pt for routine bipap check since being placed on by RN post lunch. Bipap 14/6 R10 30% FiO2. Upon arrival to room, pt off bipap at this time. RT will continue to monitor and be available as needed.  ?

## 2021-06-08 NOTE — Progress Notes (Signed)
Pharmacy Antibiotic Note- follow-up ? ?[name] is a 56 y.o. female admitted on 05/25/2021 with  wound infection/bacteremia .  Pharmacy has been consulted for cefepime  dosing. She is noted s/p Toe amp 4/27 and L BKA 5/2. Patient growing Klebsiella and MRSE (likely contaminant) in blood. HD regimen MWF, will transition cefepime to with HD. ? ?Original plan was to treat 5-7 days post-op. 5/9 will be 7 days.  ? ?Plan: ?Continue cefepime 2g IV MWF with HD ?Will follow plans for length of therapy ? ? ?Height: '5\' 8"'$  (172.7 cm) ?Weight: 116.2 kg (256 lb 2.8 oz) ?IBW/kg (Calculated) : 63.9 ? ?Temp (24hrs), Avg:97.7 ?F (36.5 ?C), Min:97.5 ?F (36.4 ?C), Max:98 ?F (36.7 ?C) ? ?Recent Labs  ?Lab 06/04/21 ?0203 06/05/21 ?0210 06/06/21 ?5053 06/07/21 ?0142 06/08/21 ?0300  ?WBC 12.9* 11.6* 9.8 9.4 9.7  ?CREATININE 4.74* 6.32* 4.67* 5.60* 7.01*  ? ?  ?Estimated Creatinine Clearance: 12 mL/min (A) (by C-G formula based on SCr of 7.01 mg/dL (H)).   ? ?Allergies  ?Allergen Reactions  ? Bupropion Itching  ? Dilaudid [Hydromorphone] Other (See Comments)  ?  Apnea, required intubation  ? ? ?Antimicrobials this admission: ?Cefepime 4/24 >>  ?Vancomycin 4/24 >> 4/30 ? ?Microbiology results: ?4/24 BCx: 2/4 MR s epi, 1/4 Enterobacter aerogenes (R: cefazolin, sens everything else) ?4/27BCID (1/4) Kleb aerogenes ? ?Thank you for allowing pharmacy to participate in this patient's care. ? ?Jeaneen Cala BS, PharmD, BCPS ?Clinical Pharmacist ?06/08/2021 8:13 AM ? ?Contact: [phone] after 3 PM ? ?"Be curious, not judgmental..." -Jamal Maes ? ? ? ? ?

## 2021-06-08 NOTE — Progress Notes (Signed)
SATURATION QUALIFICATIONS: (This note is used to comply with regulatory documentation for home oxygen) ? ?Patient Saturations on Room Air at Rest = 84% ? ?Patient Saturations on Room Air while Ambulating = N/A ? ?Patient Saturations on 2 Liters of oxygen with activity = 93% ? ?Please briefly explain why patient needs home oxygen: Pt hypoxic at rest on room air, needed 2L O2 [place] to maintain SpO2 93% and greater during functional mobility tasks. ?

## 2021-06-08 NOTE — Progress Notes (Signed)
EEG complete - results pending 

## 2021-06-08 NOTE — Progress Notes (Signed)
?PROGRESS NOTE ? ? ? ?[name]  TFT:[id number] DOB: 04-06-1965 DOA: 05/25/2021 ?PCP: Catherine Mariscal, MD  ?Chief Complaint  ?Patient presents with  ? Wound Check  ? ? 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  She had Xaria Judon chronic wound on her left fifth toe and last 3 days it has been bleeding and draining.  In the emergency room temperature 101.8, tachycardia 140, blood pressure stable.  On 4 L oxygen.  Hemoglobin 7.2.  Lactic acid 2.4.  Left foot x-ray with soft tissue swelling and osteomyelitis fifth phalanx.  Started on antibiotics, orthopedics , vascular and nephrology consulted. ?Fourth and fifth metatarsal amputation 4/27. 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  ? ? ?Assessment & Plan: ?  ?Principal Problem: ?  Osteomyelitis ([place]) ?Active Problems: ?  Shortness of breath ?  Acute metabolic encephalopathy ?  Bacteremia due to Enterobacter aerogenes ?  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  Anemia in chronic kidney disease ?  Hypotension ?  Atrial fibrillation with RVR ([place]) ?  Asterixis ?  Hypertension associated with diabetes ([place]) ?  Hyperlipidemia associated with type 2 diabetes mellitus ([place]) ?  Rheumatoid arthritis ([place]) ?  ESRD (end stage renal disease) ([place]) ?  Type 2 diabetes mellitus with diabetic chronic kidney disease ([place]) ?  Itching ?  Diastolic dysfunction ?  Chronic pain disorder ?  Coronary artery disease ?  Obesity (BMI 30-39.9) ?  Sepsis ([place]) ? ?Assessment and Plan: ?Acute metabolic encephalopathy ?Related to opiates, s/p narcan ?Opiates d/c'd ?Still very sleepy, falls asleep during conversation - sister notes this is not much different than home.  ? OSA -> trial of bipap (refused 5/7-8 PM - she doesn't  remember this -> will encourage use today).   ?Ammonia wnl, VBG without hypercarbia (repeat today) ?She's got some asterixis, myoclonic jerking. ?She's on cefepime, possible contributor? Stop this, follow EEG. ? ? ?Shortness of breath ?CXR with evidence of edema, possible L effusion ?Currently on 2 L, satting in 90's ?Will continue to monitor, volume per renal  ?Will trial CPAP/Bipap for her to see if this helps ? ?Bacteremia due to Enterobacter aerogenes ?Likely due to osteomyelitis to L foot ?Now s/p amputation ?1/2 sets with enterobacter aerogenes from 4/24 (aerobic bottle only) ?1/2 sets with staph epidermidis (aerobic/anaerobic) - thought contaminant  ?Repeat cx 4/28 and 4/29 no growth  ?Recurrent fever 5/3 PM -> follow repeat cultures (no growth), CXR with consolidation in bases edema with patchy lower zonal haziness (ground glass edema and/or pneumonia) -> repeat CXR 5/6 (pending) ?Enterobacter aerogenes resistant to ancef  ?Cefepime 4/28 - 5/7 (will d/c cefepime today with her persistent AMS and myoclonic jerking/asterixis).   9 days abx after source control on 4/27 should be adequate for gram negative bacteremia. 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 ? ? 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 ? ?Atrial fibrillation with RVR ([place]) ?Improved, rate now controlled ?TSH wnl ?chadvasc  at least 4 ?Echo with Ef 60-65%, IV septum flattened in systole, c/w RV pressure overload, RVSF mildly reduced, severely elevated PASP -> increased RVSP from prior study, IV septum flattened in systole noted on 11/2020 echo ?Transition to eliquis from heparin gtt today ?Metop PO and IV ordered with holding parameters ? ?Hypotension ?Improved (5/5 needed bolus and midodrine), will follow ?Midodrine, albumin during dialysis ?Metoprolol ordered for rate control ? ?Anemia in chronic kidney disease ?S/p 5 unit pRBC  ?Follow post transfusion Hb ?Repeat anemia labs - AOCD ? ?Asterixis ?Initially suspected related to opiates in renal dz, but she's been off these several days now ?Suspect OSA/OHS as above, but no hypercarbia - I don't know if this would be typical cause without hypercarbia? ?VBG without hypercarbia ?Continue to hold opiates ?Stop cefepime today, following EEG with her myoclonic jerks ? ?Hypertension associated with diabetes ([place]) ?Metop with holding parameters ? ?Hyperlipidemia associated with type 2 diabetes mellitus ([place]) ?rosuvastatin ? ?Rheumatoid arthritis ([place]) ?humira on hold ? ?ESRD (end stage renal disease) ([place]) ?Renal consulted ? ? ?Type 2 diabetes mellitus with diabetic chronic kidney disease ([place]) ?SSI, 70/30 ? ?Itching ?C/o itching to back during dialysis ?Will give benadryl x1 (this will affect her sleepiness) ? ? ?DVT prophylaxis: SCD ?Code Status: full ?Family Communication: none ?Disposition:  ? ?Status is: Inpatient ?Remains inpatient appropriate because: need for continued abx, continued vascular eval ?  ?Consultants:  ?Vascular ?renal ? ?Procedures:  ?4/26 ?Procedure Performed: ?1.  Ultrasound-guided micropuncture access of the right common femoral artery ?2.  Aortogram ?3.  Second-order cannulation, left lower extremity angiogram ?4.  Laser atherectomy of the anterior tibial artery ?5.  Balloon angioplasty of the anterior tibial artery ?6.  Device assisted  closure-M[employer] ? ?4/27 ?Amputation of the left fourth and fifth toes including metatarsal bones and sharp excisional debridement left foot of skin and soft tissue measuring 12 x 7 cm ?  ?5/2 ?Left below-knee amputation ? ?Antimicrobials:  ?Anti-infectives (From admission, onward)  ? ? Start     Dose/Rate Route Frequency Ordered Stop  ? 06/08/21 1200  ceFEPIme (MAXIPIME) 2 g in sodium chloride 0.9 % 100 mL IVPB  Status:  Discontinued       ? 2 g ?200 mL/hr over 30 Minutes Intravenous Every M-W-F (Hemodialysis) 06/07/21 1332 06/08/21 0922  ? 05/29/21 0845  ceFEPIme (MAXIPIME) 2 g in sodium chloride 0.9 % 100 mL IVPB       ? 2 g ?200 mL/hr over 30 Minutes Intravenous  Once 05/29/21 0836 05/29/21 1220  ? 05/27/21 1200  vancomycin (VANCOCIN) IVPB 1000 mg/200 mL premix  Status:  Discontinued       ? 1,000 mg ?200 mL/hr over 60 Minutes Intravenous Every M-W-F (Hemodialysis) 05/27/21 0844 05/31/21 0724  ? 05/26/21 2200  ceFEPIme (MAXIPIME) 1 g in sodium chloride 0.9 % 100 mL IVPB  Status:  Discontinued       ? 1 g ?200 mL/hr over 30 Minutes Intravenous Every 24 hours 05/25/21 2255 06/07/21 1332  ? 05/25/21 2200  ceFEPIme (MAXIPIME) 2 g in sodium chloride 0.9 % 100 mL IVPB       ? 2 g ?200 mL/hr over 30 Minutes Intravenous  Once 05/25/21 2123 05/25/21 2229  ? 05/25/21 2100  vancomycin (VANCOREADY) IVPB 2000  mg/400 mL       ? 2,000 mg ?200 mL/hr over 120 Minutes Intravenous  Once 05/25/21 2012 05/26/21 0041  ? ?  ? ? ?Subjective: ?C/o itching ? ?Objective: ?Vitals:  ? 06/08/21 0800 06/08/21 0830 06/08/21 0900 06/08/21 0930  ?BP: 120/65 112/75 (!) 125/59 117/62  ?Pulse: 74 84 65 76  ?Resp: (!) 21 (!) 26 (!) 21 (!) 22  ?Temp:      ?TempSrc:      ?SpO2:      ?Weight:      ?Height:      ? ?No intake or output data in the 24 hours ending 06/08/21 0942 ? ?Filed Weights  ? 06/07/21 0600 06/08/21 0319 06/08/21 0646  ?Weight: 117.5 kg 120.4 kg 116.2 kg  ? ? ?Examination: ? ?General: No acute distress. ?Cardiovascular: RRR ?Lungs:  unlabored ?Abdomen: Soft, nontender, protuberant ?Neurological: moving all extremities, sleepy again today, hard to get her to stay awake ?Extremities: L BKA, dressing ? ? ? ? ?Data Reviewed: I have personally reviewed following labs and ima

## 2021-06-08 NOTE — Procedures (Signed)
Patient Name: [name]  ?MRN: [id number]  ?Epilepsy Attending: Lora Havens  ?Referring Physician/Provider: Elodia Florence., MD ?Date: 06/08/2021 ?Duration: 21.30 mins ? ?Patient history: 56 year old female with altered mental status.  EEG to evaluate for seizure. ? ?Level of alertness: lethargic  ? ?AEDs during EEG study: None ? ?Technical aspects: This EEG study was done with scalp electrodes positioned according to the 10-20 International system of electrode placement. Electrical activity was acquired at a sampling rate of '500Hz'$  and reviewed with a high frequency filter of '70Hz'$  and a low frequency filter of '1Hz'$ . EEG data were recorded continuously and digitally stored.  ? ?Description: No posterior dominant rhythm was seen. EEG showed continuous generalized 3 to 6 Hz theta-delta slowing. Hyperventilation and photic stimulation were not performed.    ? ?ABNORMALITY ?- Continuous slow, generalized ? ?IMPRESSION: ?This study is suggestive of moderate diffuse encephalopathy, nonspecific etiology. No seizures or epileptiform discharges were seen throughout the recording. ? ?Lora Havens  ? ?

## 2021-06-08 NOTE — TOC Benefit Eligibility Note (Signed)
Patient Advocate Encounter ?  ?Insurance verification completed.   ?  ?The patient is currently admitted and upon discharge could be taking eliquis '5mg'$ . ?  ?The current 30 day co-pay is, $0.  ? ?The patient is insured through [employer]. ? ? ?   ?

## 2021-06-08 NOTE — Progress Notes (Signed)
ANTICOAGULATION CONSULT NOTE ? ?Pharmacy Consult for htransition from heparin infusion to apixaban ?Indication: atrial fibrillation ? ?Allergies  ?Allergen Reactions  ? Bupropion Itching  ? Dilaudid [Hydromorphone] Other (See Comments)  ?  Apnea, required intubation  ? ? ?Patient Measurements: ?Height: _0  (172.7 cm) ?Weight: 116.2 kg (256 lb 2.8 oz) ?IBW/kg (Calculated) : 63.9 ? ?Vital Signs: ?Temp: 97.7 ?F (36.5 ?C) (05/08 0947) ?Temp Source: Oral (05/08 0319) ?BP: 117/62 (05/08 0930) ?Pulse Rate: 76 (05/08 0930) ? ?Labs: ?Recent Labs  ?  06/06/21 ?0962 06/06/21 ?1609 06/07/21 ?0142 06/07/21 ?1120 06/07/21 ?2106 06/08/21 ?0300  ?HGB 7.6*  --  7.4*  --   --  7.8*  ?HCT 24.6*  --  24.2*  --   --  26.2*  ?PLT 260  --  266  --   --  261  ?HEPARINUNFRC <0.10*   < > <0.10* 0.19* 0.24* 0.31  ?CREATININE 4.67*  --  5.60*  --   --  7.01*  ? < > = values in this interval not displayed.  ? ? ? ?Estimated Creatinine Clearance: 12 mL/min (A) (by C-G formula based on SCr of 7.01 mg/dL (H)). ? ? ?Medical History: ?Past Medical History:  ?Diagnosis Date  ? Anaphylactic shock, unspecified, initial encounter 09/04/2018  ? Anemia   ? Anxiety   ? CAD (coronary artery disease)   ? Nonobstructive on CT 2019  ? CHF (congestive heart failure) ([place])   ? Chronic bilateral pleural effusions 10/23/2018  ? COVID-19 10/2018  ? COVID-19 virus detected 10/10/2018  ? COVID-19 virus infection 09/18/2018  ? ESRD (end stage renal disease) ([place])   ? Dialysis TTHSat- 3rd st  ? Gangrene ([place]) 09/18/2018  ? Headache(784.0)   ? High cholesterol   ? History of blood transfusion   ? Hypertension   ? Mitral regurgitation   ? moderate to severe MR 10/2018 echo  ? Nerve pain   ? "they say I have L5 nerve damage; my lumbar"  ? Neuropathy 01/29/2011  ? 04/2011 MRI L-spine:  L5-S1: Bulge/shallow broad-based protrusion greatest centrally and in the right posterior lateral position. Minimal crowding of the  upper aspect of the S1 nerve root greater on the right.  Left lateral disc osteophyte with mild encroachment upon but not  significant compression of the exiting left L5 nerve root.   05/2011: Dr. Sherwood Gambler ([facility]): DJD and mild disc bu  ? Pericardial effusion   ? Pneumonia   ? ; 11/16/2018- "touch of pneumonia" - seen in ED- 11/14/2018- on antibiotic. Has had it x 2  ? Pneumonia 10/10/2018  ? PVD (peripheral vascular disease) ([place])   ? Right leg stent in [place].  (No records)  ? Restless legs   ? Rheumatoid arthritis ([place])   ? "knees" "hands", "RA"  ? Sleep apnea   ? does not use Cpap  ? Thoracic ascending aortic aneurysm ([place])   ? 4.4 cm 11/14/18 CTA  ? Type II diabetes mellitus ([place])   ? Uterine leiomyoma 01/10/2021  ? ? ?Medications:  ?Medications Prior to Admission  ?Medication Sig Dispense Refill Last Dose  ? acetaminophen (TYLENOL) 500 MG tablet Take 500-1,000 mg by mouth daily as needed for headache (pain).   Past Week  ? Ascorbic Acid (VITAMIN C PO) Take 1 tablet by mouth See admin instructions. At dialysis Monday,Wednesday and friday   Past Week  ? AURYXIA 1 GM 210 MG(Fe) tablet Take 420-840 mg by mouth See admin instructions. 840 mg twice daily with meals, occasionally  take 420 mg with a snack   Past Week  ? b complex-vitamin c-folic acid (NEPHRO-VITE) 0.8 MG TABS tablet Take 1 tablet by mouth every Monday, Wednesday, and Friday with hemodialysis.   Past Week  ? HUMIRA PEN 40 MG/0.4ML PNKT Inject 40 mg into the skin every 14 (fourteen) days.   Past Week  ? hydrOXYzine (ATARAX/VISTARIL) 25 MG tablet Take 25 mg by mouth every morning.   Past Week  ? Insulin Lispro Prot & Lispro (HUMALOG 75/25 MIX) (75-25) 100 UNIT/ML Kwikpen Inject 35 Units into the skin 2 (two) times daily with a meal. (Patient taking differently: Inject 35 Units into the skin in the morning and at bedtime.) 45 mL 3 Past Week  ? Methoxy PEG-Epoetin Beta (MIRCERA IJ) Dialysis Monday,Wednesday and friday   Past Week  ? metoprolol succinate (TOPROL-XL) 100 MG 24 hr tablet Take 100 mg  by mouth daily.   Past Week at [facility]  ? Multiple Vitamins-Minerals (ZINC PO) Take 1 tablet by mouth every morning.   Past Week  ? OVER THE COUNTER MEDICATION Take 1 capsule by mouth every morning. Omega XL   Past Month  ? oxyCODONE-acetaminophen (PERCOCET) 10-325 MG tablet Take 1 tablet by mouth 3 (three) times daily as needed for pain.   Past Week  ? pramipexole (MIRAPEX) 0.5 MG tablet Take 0.5 mg by mouth daily.   1 Past Week  ? rosuvastatin (CRESTOR) 40 MG tablet TAKE 1 TABLET BY MOUTH DAILY *PATIENT NEEDS APPOINTMENT* (Patient taking differently: Take 40 mg by mouth daily.) 30 tablet 10 Past Week  ? albuterol (VENTOLIN HFA) 108 (90 Base) MCG/ACT inhaler Inhale 2 puffs into the lungs every 6 (six) hours as needed for wheezing or shortness of breath. (Patient not taking: Reported on 05/27/2021) 6.7 g 0 Not Taking  ? glucose blood (ONETOUCH VERIO) test strip 1 each by Other route 2 (two) times daily. And lancets 2/day 200 each 3   ? NARCAN 4 MG/0.1ML LIQD nasal spray kit Place 1 spray into the nose as needed (accidental overdose.).  (Patient not taking: Reported on 05/27/2021)   Not Taking  ? ?Scheduled:  ? (feeding supplement) PROSource Plus  30 mL Oral BID BM  ? sodium chloride   Intravenous Once  ? sodium chloride   Intravenous Once  ? apixaban  5 mg Oral BID  ? Chlorhexidine Gluconate Cloth  6 each Topical Q0600  ? clopidogrel  75 mg Oral Q breakfast  ? docusate sodium  100 mg Oral Daily  ? insulin aspart  0-5 Units Subcutaneous QHS  ? insulin aspart  0-6 Units Subcutaneous TID WC  ? insulin aspart protamine- aspart  28 Units Subcutaneous BID WC  ? metoprolol tartrate  12.5 mg Oral BID  ? pantoprazole  40 mg Oral Daily  ? pramipexole  0.125 mg Oral QHS  ? rosuvastatin  40 mg Oral Daily  ? sodium chloride flush  3 mL Intravenous Q12H  ? sodium chloride flush  3 mL Intravenous Q12H  ? ?Infusions:  ? sodium chloride    ? sodium chloride    ? sodium chloride    ? heparin 2,500 Units/hr (06/08/21 0116)   ? ? ?Assessment: ?Pt with multiple medical issues who is s/p BKA. Heparin has been ordered for her afib. Holding off DOAC right now because of high risk for bleeding. She has received multiple PRBCs.  ? ?Consult received 06/08/2021 to transition to apixaban after hemodialysis today or later this evening.  ? ?Goal of Therapy:  ?  Monitor platelets by anticoagulation protocol: Yes ?  ?Plan:  ? ?DISCONTINUE heparin infusion 06/08/2021 at 20:59 ? ? ?START Apixaban 5 mg po BID on 06/09/2019 at 21:00 ? ? ?Monitor CBC, signs of bleeding. Pharmacy will sign off of the consult but continue to monitor along with internal medicine and make recommendations PRN. Thank you ? ?Lorieann Argueta BS, PharmD, BCPS ?Clinical Pharmacist ?06/08/2021 10:18 AM ? ?Contact: [phone] after 3 PM ? ?"Be curious, not judgmental..." -Jamal Maes ? ? ? ? ?

## 2021-06-08 NOTE — Progress Notes (Signed)
removed 4076ms net fluid.  pt constantly moving right arm and causing trauma to needles,  fussing at dialysis tuff due to not being able to rest sleep and itching,  gave patient benadryl 25 mg oral.  pre bp 136/72 post bp 117/62  gave midodrine 10 mg for bp support .  2 bandages to rua avf no bleeding dressing cdi  pre weight 116.2kg post weight 112.0kg bed scales. ?

## 2021-06-08 NOTE — Assessment & Plan Note (Addendum)
improved

## 2021-06-08 NOTE — Progress Notes (Signed)
PT Cancellation Note ? ?Patient Details ?Name: [name] ?MRN: [id number] ?DOB: 1965-11-25 ? ? ?Cancelled Treatment:    Reason Eval/Treat Not Completed: (P) Patient at procedure or test/unavailable (pt at HD dept.) Will continue efforts in afternoon per PT plan of care as schedule permits. ? ? ?Kara Pacer Vijay Durflinger ?06/08/2021, 11:09 AM ? ? ?

## 2021-06-09 LAB — CULTURE, BLOOD (ROUTINE X 2)
Culture: NO GROWTH
Culture: NO GROWTH

## 2021-06-09 LAB — CBC WITH DIFFERENTIAL/PLATELET
Abs Immature Granulocytes: 0 10*3/uL (ref 0.00–0.07)
Basophils Absolute: 0 10*3/uL (ref 0.0–0.1)
Basophils Relative: 0 %
Eosinophils Absolute: 0.4 10*3/uL (ref 0.0–0.5)
Eosinophils Relative: 4 %
HCT: 27.3 % — ABNORMAL LOW (ref 36.0–46.0)
Hemoglobin: 8.6 g/dL — ABNORMAL LOW (ref 12.0–15.0)
Lymphocytes Relative: 12 %
Lymphs Abs: 1.1 10*3/uL (ref 0.7–4.0)
MCH: 30.7 pg (ref 26.0–34.0)
MCHC: 31.5 g/dL (ref 30.0–36.0)
MCV: 97.5 fL (ref 80.0–100.0)
Monocytes Absolute: 0.6 10*3/uL (ref 0.1–1.0)
Monocytes Relative: 7 %
Neutro Abs: 6.8 10*3/uL (ref 1.7–7.7)
Neutrophils Relative %: 77 %
Platelets: 247 10*3/uL (ref 150–400)
RBC: 2.8 MIL/uL — ABNORMAL LOW (ref 3.87–5.11)
RDW: 16.1 % — ABNORMAL HIGH (ref 11.5–15.5)
WBC: 8.8 10*3/uL (ref 4.0–10.5)
nRBC: 1.8 % — ABNORMAL HIGH (ref 0.0–0.2)

## 2021-06-09 LAB — COMPREHENSIVE METABOLIC PANEL
ALT: 37 U/L (ref 0–44)
AST: 44 U/L — ABNORMAL HIGH (ref 15–41)
Albumin: 2.6 g/dL — ABNORMAL LOW (ref 3.5–5.0)
Alkaline Phosphatase: 191 U/L — ABNORMAL HIGH (ref 38–126)
Anion gap: 13 (ref 5–15)
BUN: 34 mg/dL — ABNORMAL HIGH (ref 6–20)
CO2: 25 mmol/L (ref 22–32)
Calcium: 8.6 mg/dL — ABNORMAL LOW (ref 8.9–10.3)
Chloride: 94 mmol/L — ABNORMAL LOW (ref 98–111)
Creatinine, Ser: 4.96 mg/dL — ABNORMAL HIGH (ref 0.44–1.00)
GFR, Estimated: 10 mL/min — ABNORMAL LOW (ref >60)
Glucose, Bld: 189 mg/dL — ABNORMAL HIGH (ref 70–99)
Potassium: 4.4 mmol/L (ref 3.5–5.1)
Sodium: 132 mmol/L — ABNORMAL LOW (ref 135–145)
Total Bilirubin: 0.7 mg/dL (ref 0.3–1.2)
Total Protein: 7.7 g/dL (ref 6.5–8.1)

## 2021-06-09 LAB — PHOSPHORUS: Phosphorus: 3.2 mg/dL (ref 2.5–4.6)

## 2021-06-09 LAB — GLUCOSE, CAPILLARY
Glucose-Capillary: 141 mg/dL — ABNORMAL HIGH (ref 70–99)
Glucose-Capillary: 174 mg/dL — ABNORMAL HIGH (ref 70–99)
Glucose-Capillary: 172 mg/dL — ABNORMAL HIGH (ref 70–99)
Glucose-Capillary: 247 mg/dL — ABNORMAL HIGH (ref 70–99)

## 2021-06-09 LAB — MAGNESIUM: Magnesium: 2 mg/dL (ref 1.7–2.4)

## 2021-06-09 NOTE — Progress Notes (Signed)
Pt placed on bipap per request. Tolerating well. ?

## 2021-06-09 NOTE — Progress Notes (Signed)
Occupational Therapy Treatment ?Patient Details ?Name: [name] ?MRN: [id number] ?DOB: 11/28/1965 ?Today's Date: 06/09/2021 ? ? ?History of present illness 56 y.o. female presented 05/25/21 with infected L 5th toe. S/p L leg aortogram, angiogram, laser atherectomy of the anterior tibial artery, and balloon angioplasty of anterior tibial artery 4/26. 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 PMH: hypertension, diabetes, hyperlipidemia, CHF, ESRD on HD, chronic pain, CAD, anemia, Buerger disease, rheumatoid arthritis ?  ?OT comments ? Patient seated on EOB and eager to get into chair. Patient was able to get underwear up to knees and required assistance to pull up.  Patient was max assist +2 to stand into Stedy and max assist to pull up underwear. Patient rested on pads and transferred to wheelchair. Patient performed wheelchair mobility with verbal cues.  Transfer to recliner performed with sliding board with setup, verbal cues and min guard assist. Patient is making good progress and is mobility towards therapy.   ? ?Recommendations for follow up therapy are one component of a multi-disciplinary discharge planning process, led by the attending physician.  Recommendations may be updated based on patient status, additional functional criteria and insurance authorization. ?   ?Follow Up Recommendations ? Skilled nursing-short term rehab (<3 hours/day)  ?  ?Assistance Recommended at Discharge Frequent or constant Supervision/Assistance  ?Patient can return home with the following ? A lot of help with bathing/dressing/bathroom;Two people to help with bathing/dressing/bathroom;Assistance with cooking/housework;Help with stairs or ramp for entrance;Assist for transportation ?  ?Equipment Recommendations ? None recommended by OT  ?  ?Recommendations for Other Services   ? ?  ?Precautions / Restrictions Precautions ?Precautions: Fall ?Precaution Comments: new L BKA 06/02/21 ?Required Braces or  Orthoses: Other Brace ?Other Brace: limb guard ?Restrictions ?Weight Bearing Restrictions: Yes ?LLE Weight Bearing: Non weight bearing  ? ? ?  ? ?Mobility Bed Mobility ?Overal bed mobility: Needs Assistance ?  ?  ?  ?  ?  ?  ?General bed mobility comments: seated on EOB upon entry ?  ? ?Transfers ?Overall transfer level: Needs assistance ?Equipment used: Ambulation equipment used, Sliding board ?Transfers: Sit to/from Stand, Bed to chair/wheelchair/BSC ?Sit to Stand: Max assist, From elevated surface, +2 physical assistance, +2 safety/equipment ?  ?  ?  ?  ? Lateral/Scoot Transfers: Min guard, With slide board ?General transfer comment: transfer from EOB to wheelchair with Stedy and transfer from wheelchair to recliner with sliding board ?Transfer via Lift Equipment: Stedy ?  ?Balance Overall balance assessment: Needs assistance ?Sitting-balance support: Bilateral upper extremity supported, Feet supported, No upper extremity supported ?Sitting balance-Leahy Scale: Fair ?Sitting balance - Comments: seated on EOB upon entry ?  ?Standing balance support: Bilateral upper extremity supported ?Standing balance-Leahy Scale: Poor ?Standing balance comment: stood in stedy to pull up underwear and to transfer to wheelchair ?  ?  ?  ?  ?  ?  ?  ?  ?  ?  ?  ?   ? ?ADL either performed or assessed with clinical judgement  ? ?ADL Overall ADL's : Needs assistance/impaired ?  ?  ?  ?  ?  ?  ?  ?  ?  ?  ?Lower Body Dressing: Maximal assistance;Sit to/from stand ?Lower Body Dressing Details (indicate cue type and reason): patient had underwear up to her knees and required assistance to pull up. Patient stood in [place] with max assist of 2 for balance and max assist to pull up ?  ?  ?  ?  ?  ?  ?  ?  ?  ? ?  Extremity/Trunk Assessment   ?  ?  ?  ?  ?  ? ?Vision   ?  ?  ?Perception   ?  ?Praxis   ?  ? ?Cognition Arousal/Alertness: Awake/alert ?Behavior During Therapy: [facility] for tasks assessed/performed ?Overall Cognitive Status: Within  Functional Limits for tasks assessed ?  ?  ?  ?  ?  ?  ?  ?  ?  ?  ?  ?  ?  ?  ?  ?  ?General Comments: patient motivated to get out of bed and participate with therapy ?  ?  ?   ?Exercises   ? ?  ?Shoulder Instructions   ? ? ?  ?General Comments wheelchair mobility performed in hallway  ? ? ?Pertinent Vitals/ Pain       Pain Assessment ?Pain Assessment: Faces ?Faces Pain Scale: Hurts a little bit ?Pain Location: L residual limb ?Pain Descriptors / Indicators: Discomfort, Grimacing ?Pain Intervention(s): Monitored during session ? ?Home Living   ?  ?  ?  ?  ?  ?  ?  ?  ?  ?  ?  ?  ?  ?  ?  ?  ?  ?  ? ?  ?Prior Functioning/Environment    ?  ?  ?  ?   ? ?Frequency ? Min 2X/week  ? ? ? ? ?  ?Progress Toward Goals ? ?OT Goals(current goals can now be found in the care plan section) ? Progress towards OT goals: Progressing toward goals ? ?Acute Rehab OT Goals ?Patient Stated Goal: get better ?OT Goal Formulation: With patient ?Time For Goal Achievement: 06/18/21 ?Potential to Achieve Goals: Good ?ADL Goals ?Pt Will Perform Grooming: with modified independence;sitting ?Pt Will Perform Upper Body Dressing: with modified independence;sitting ?Pt Will Perform Lower Body Dressing: with min assist;sitting/lateral leans ?Pt Will Transfer to Toilet: stand pivot transfer;bedside commode;with mod assist  ?Plan Discharge plan remains appropriate   ? ?Co-evaluation ? ? ? PT/OT/SLP Co-Evaluation/Treatment: Yes ?Reason for Co-Treatment: For patient/therapist safety;To address functional/ADL transfers ?  ?OT goals addressed during session: ADL's and self-care ?  ? ?  ?AM-PAC OT "6 Clicks" Daily Activity     ?Outcome Measure ? ? Help from another person eating meals?: None ?Help from another person taking care of personal grooming?: A Little ?Help from another person toileting, which includes using toliet, bedpan, or urinal?: A Lot ?Help from another person bathing (including washing, rinsing, drying)?: A Lot ?Help from another person  to put on and taking off regular upper body clothing?: A Lot ?Help from another person to put on and taking off regular lower body clothing?: A Lot ?6 Click Score: 15 ? ?  ?End of Session Equipment Utilized During Treatment: Gait belt;Oxygen;Other (comment) (stedy and wheelchair) ? ?OT Visit Diagnosis: Unsteadiness on feet (R26.81);Muscle weakness (generalized) (M62.81);Pain ?Pain - Right/Left: Left ?Pain - part of body: Leg ?  ?Activity Tolerance Patient tolerated treatment well ?  ?Patient Left in chair;with call bell/phone within reach;with chair alarm set ?  ?Nurse Communication Mobility status ?  ? ?   ? ?Time: 2947-6546 ?OT Time Calculation (min): 36 min ? ?Charges: OT General Charges ?$OT Visit: 1 Visit ?OT Treatments ?$Self Care/Home Management : 8-22 mins ? ?Lodema Hong, OTA ?Acute Rehabilitation Services  ?Pager [phone] ?Office [phone] ? ? ?Yazmyne Sara Alexis Goodell ?06/09/2021, 1:12 PM ?

## 2021-06-09 NOTE — Progress Notes (Signed)
Pt removed bipap- wore for about 3 hours. She did not want it put back on. Falls asleep easily after repositioning. 95% on 2L nasal cannula ?

## 2021-06-09 NOTE — Progress Notes (Signed)
?[facility] ?Progress Note  ? ?Subjective:    ?Seen and examined patient at bedside. Tolerated 4L UF with HD yest.  Wore cpap for a few hrs overnight, couldn't tolerate longer.  Much more alert this AM ? ?Objective ?Vitals:  ? 06/09/21 0114 06/09/21 0410 06/09/21 9476 06/09/21 5465  ?BP:  129/71  125/60  ?Pulse:  77 79 74  ?Resp:  20  20  ?Temp:  98.2 ?F (36.8 ?C)  98.1 ?F (36.7 ?C)  ?TempSrc:  Oral  Oral  ?SpO2:  94% 96% 96%  ?Weight: 113.1 kg     ?Height:      ? ?Physical Exam ?General: chronically ill appearing, NAD, sitting at eob ?Heart: RRR, no mrg ?Lungs: CTAB, nml WOB ?Abdomen: soft, NTND ?Extremities: trace LE edema, woody appearance. L BKA incision c/d/i ?Dialysis Access: RU AVF +b/t   ? ?Filed Weights  ? 06/08/21 0646 06/08/21 1030 06/09/21 0114  ?Weight: 116.2 kg 112.8 kg 113.1 kg  ? ? ?Intake/Output Summary (Last 24 hours) at 06/09/2021 0906 ?Last data filed at 06/08/2021 2123 ?Gross per 24 hour  ?Intake 1348.18 ml  ?Output 4000 ml  ?Net -2651.82 ml  ? ? ?Additional Objective ?Labs: ?Basic Metabolic Panel: ?Recent Labs  ?Lab 06/07/21 ?0142 06/08/21 ?0300 06/09/21 ?0102  ?NA 135 131* 132*  ?K 4.3 5.0 4.4  ?CL 97* 93* 94*  ?CO2 '23 22 25  '$ ?GLUCOSE 120* 135* 189*  ?BUN 40* 52* 34*  ?CREATININE 5.60* 7.01* 4.96*  ?CALCIUM 8.6* 8.6* 8.6*  ?PHOS 3.8 4.9* 3.2  ? ? ?Liver Function Tests: ?Recent Labs  ?Lab 06/06/21 ?0354 06/07/21 ?0142 06/08/21 ?0300 06/09/21 ?0102  ?AST 76* 128*  --  44*  ?ALT 36 66*  --  37  ?ALKPHOS 178* 199*  --  191*  ?BILITOT 0.6 0.7  --  0.7  ?PROT 7.7 7.4  --  7.7  ?ALBUMIN 2.6* 2.5* 2.6* 2.6*  ? ? ?No results for input(s): LIPASE, AMYLASE in the last 168 hours. ?CBC: ?Recent Labs  ?Lab 06/05/21 ?0210 06/05/21 ?1640 06/06/21 ?6568 06/07/21 ?0142 06/08/21 ?0300 06/09/21 ?0102  ?WBC 11.6*  --  9.8 9.4 9.7 8.8  ?NEUTROABS 9.4*  --  7.5 6.6  --  6.8  ?HGB 7.5*   < > 7.6* 7.4* 7.8* 8.6*  ?HCT 24.0*   < > 24.6* 24.2* 26.2* 27.3*  ?MCV 94.5  --  95.0 96.4 98.5 97.5  ?PLT 259  --   260 266 261 247  ? < > = values in this interval not displayed.  ? ? ?Blood Culture ?   ?Component Value Date/Time  ? SDES BLOOD BLOOD LEFT FOREARM 06/04/2021 1840  ? SPECREQUEST  06/04/2021 1840  ?  BOTTLES DRAWN AEROBIC ONLY Blood Culture results may not be optimal due to an inadequate volume of blood received in culture bottles  ? CULT  06/04/2021 1840  ?  NO GROWTH 4 DAYS ?Performed at [facility], [place] [address] [place], [place] [address] ?  ? REPTSTATUS PENDING 06/04/2021 1840  ? ? ?Cardiac Enzymes: ?No results for input(s): CKTOTAL, CKMB, CKMBINDEX, TROPONINI in the last 168 hours. ?CBG: ?Recent Labs  ?Lab 06/08/21 ?0628 06/08/21 ?1117 06/08/21 ?1619 06/08/21 ?2054 06/09/21 ?7001  ?GLUCAP 161* 103* 170* 214* 141*  ? ? ?Iron Studies:  ?No results for input(s): IRON, TIBC, TRANSFERRIN, FERRITIN in the last 72 hours. ? ?Lab Results  ?Component Value Date  ? INR 1.0 11/14/2018  ? INR 0.90 01/30/2011  ? ?Studies/Results: ?DG CHEST  PORT 1 VIEW ? ?Result Date: 06/07/2021 ?CLINICAL DATA:  Shortness of breath. EXAM: PORTABLE CHEST 1 VIEW COMPARISON:  06/06/2021 FINDINGS: Lungs are hypoinflated with hazy perihilar and bibasilar opacification unchanged. This may be due to edema versus infection. Possible left pleural effusion unchanged. Stable cardiomegaly. Remainder of the exam is unchanged. IMPRESSION: 1. Stable hazy perihilar and bibasilar opacification which may be due to edema versus infection. Possible left pleural effusion unchanged. 2. Stable cardiomegaly. Electronically Signed   By: Marin Olp M.D.   On: 06/07/2021 14:42  ? ?EEG adult ? ?Result Date: 06/08/2021 ?Lora Havens, MD     06/08/2021  3:00 PM Patient Name: [name] MRN: [id number] Epilepsy Attending: Lora Havens Referring Physician/Provider: Elodia Florence., MD Date: 06/08/2021 Duration: 21.30 mins Patient history: 56 year old female with altered mental status.  EEG to evaluate for seizure. Level of alertness: lethargic  AEDs during EEG study: None Technical aspects: This EEG study was done with scalp electrodes positioned according to the 10-20 International system of electrode placement. Electrical activity was acquired at a sampling rate of '500Hz'$  and reviewed with a high frequency filter of '70Hz'$  and a low frequency filter of '1Hz'$ . EEG data were recorded continuously and digitally stored. Description: No posterior dominant rhythm was seen. EEG showed continuous generalized 3 to 6 Hz theta-delta slowing. Hyperventilation and photic stimulation were not performed.   ABNORMALITY - Continuous slow, generalized IMPRESSION: This study is suggestive of moderate diffuse encephalopathy, nonspecific etiology. No seizures or epileptiform discharges were seen throughout the recording. Priyanka Barbra Sarks   ? ?Medications: ? sodium chloride    ? ? (feeding supplement) PROSource Plus  30 mL Oral BID BM  ? sodium chloride   Intravenous Once  ? sodium chloride   Intravenous Once  ? apixaban  5 mg Oral BID  ? Chlorhexidine Gluconate Cloth  6 each Topical Q0600  ? clopidogrel  75 mg Oral Q breakfast  ? docusate sodium  100 mg Oral Daily  ? insulin aspart  0-5 Units Subcutaneous QHS  ? insulin aspart  0-6 Units Subcutaneous TID WC  ? insulin aspart protamine- aspart  28 Units Subcutaneous BID WC  ? metoprolol tartrate  12.5 mg Oral BID  ? pantoprazole  40 mg Oral Daily  ? pramipexole  0.125 mg Oral QHS  ? rosuvastatin  40 mg Oral Daily  ? sodium chloride flush  3 mL Intravenous Q12H  ? sodium chloride flush  3 mL Intravenous Q12H  ? ? ?Dialysis Orders: ?MWF  - GOC ?4hrs, BFR 450, DFR 800,  EDW 110.6kg, 2K/ 2.5Ca, RU AVF,  ?-Heparin 10000 unit IV bolus TIW ? - No ESA 2/2 ?malignancy (no diagnosis - has stable renal lesion, pulm lesions -> saw onc 4/17, plan is for bronchoscopy with Bx of lung nodules in near future) ?- Calcitriol 2.5 mcg PO qHD ?- Parsabiv 7.5 mg IV qHD  ? 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  S/p balloon angioplasty and laser atherectomy to LATA 4/26, then amputation of L 4th/5th metarsals 05/28/2021 Dr. Donzetta Matters. 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  ?Afib with RVR- new onset 5/6 after HD. Remains in SR. Continue Metoprolol.  Now on eliquis. Per primary, may switch to Digoxin or Amiodarone if Afib persist or worsens. ?Volume overload:  Continue lowering volume as tolerated with HD. 2.5kg over EDW - will try UFG 4L with HD tomorrow. Fluid restriction in place ?ESRD: Continue HD  per usual MWF schedule.  ?Hypertension: BP variable - normal this AM. On metoprolol for rate control. Has had hypotension with HD, can utilizing midodrine with HD for BP support.   ?Anemia of ESRD: Hgb 7 on admit, s/p 5 units PRBCs total during hospitalization so far. Hb 8.6.  Follow trends. Not getting outpatient ESA d/t potential malignancy. Transfusion for Hgb <7.  ?Secondary Hyperparathyroidism: Ca/Phos ok. Continue VDRA on MWF.  Unable to give parsabiv not on formulary.  Continue binders. ?Nutrition - Renal diet w/fluid restrictions.  ?DMT2 - per PMD ? ?Jannifer Hick MD ?[facility] ?Pager [phone] ? ?  ?

## 2021-06-09 NOTE — Progress Notes (Signed)
Orthopedic Tech Progress Note ?Patient Details:  ?[name] ?09/19/1965 ?[id number] ? ?Called in order to [employer]  For a [place]  ? ?Patient ID: [name], female   DOB: [birth year], 56 y.o.   MRN: [id number] ? ?Janit Pagan ?06/09/2021, 8:41 AM ? ?

## 2021-06-09 NOTE — Progress Notes (Signed)
Physical Therapy Treatment ?Patient Details ?Name: [name] ?MRN: [id number] ?DOB: 07-27-1965 ?Today's Date: 06/09/2021 ? ? ?History of Present Illness 56 y.o. female presented 05/25/21 with infected L 5th toe. S/p L leg aortogram, angiogram, laser atherectomy of the anterior tibial artery, and balloon angioplasty of anterior tibial artery 4/26. 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 PMH: hypertension, diabetes, hyperlipidemia, CHF, ESRD on HD, chronic pain, CAD, anemia, Buerger disease, rheumatoid arthritis ? ?  ?PT Comments  ? ? Pt received in supine, agreeable to collaborative therapy session with OT for transfer training and wheelchair mobility training. Pt more alert this session with improved technique and carryover of safety cues. Pt able to perform multiple sit<>stand with up to +2 maxA and scoot transfer from wheelchair>drop arm recliner with min guard and slide board (+2 for line mgmt/safety). Pt able to propel and turn wheelchair in hallway with up to minA for safe technique but upper extremities fatigued prior to return to room and she needed to be pushed back the remaining ~87f. Pt continues to benefit from PT services to progress toward functional mobility goals.   ?Recommendations for follow up therapy are one component of a multi-disciplinary discharge planning process, led by the attending physician.  Recommendations may be updated based on patient status, additional functional criteria and insurance authorization. ? ?Follow Up Recommendations ? Skilled nursing-short term rehab (<3 hours/day) (would recommend AIR but insurance won't approve it) ?  ?  ?Assistance Recommended at Discharge Frequent or constant Supervision/Assistance  ?Patient can return home with the following Two people to help with walking and/or transfers;A lot of help with bathing/dressing/bathroom;Assistance with cooking/housework;Assist for transportation;Help with stairs or ramp for entrance ?   ?Equipment Recommendations ? BSC/3in1;Rolling walker (2 wheels) (drop-arm BSC, current RW is broken)  ?  ?Recommendations for Other Services   ? ? ?  ?Precautions / Restrictions Precautions ?Precautions: Fall ?Precaution Comments: new L BKA 06/02/21 ?Required Braces or Orthoses: Other Brace ?Other Brace: limb guard ?Restrictions ?Weight Bearing Restrictions: Yes ?LLE Weight Bearing: Non weight bearing  ?  ? ?Mobility ? Bed Mobility ?Overal bed mobility: Needs Assistance ?  ?  ?  ?  ?  ?  ?General bed mobility comments: seated on EOB upon entry ?  ? ?Transfers ?Overall transfer level: Needs assistance ?Equipment used: Ambulation equipment used, Sliding board ?Transfers: Sit to/from Stand, Bed to chair/wheelchair/BSC ?Sit to Stand: Max assist, From elevated surface, +2 physical assistance, +2 safety/equipment ?  ?  ?  ?  ? Lateral/Scoot Transfers: Min guard, With slide board, +2 safety/equipment ?General transfer comment: [facility] to recliner with sliding board. Pt stood x1 from EOB and x2 reps from [place]. ?Transfer via Lift Equipment: Stedy ? ? ?Wheelchair Mobility ?Wheelchair Mobility ?Wheelchair mobility: Yes ?Wheelchair propulsion: Both upper extremities ?Wheelchair parts: Needs assistance ?Distance: 60 ?Wheelchair Assistance Details (indicate cue type and reason): cues for B hand placement on wheelchair rails with good carryover, cues for forward/backward and turns needed as well as for activity pacing; pt VSS on 2L O2 [place], up to minA for w/c mgmt. Pt fatigued after 685fand needed therapist to push wheelchair the rest of the way back into room. ? ?Modified Rankin (Stroke Patients Only) ?  ? ? ?  ?Balance Overall balance assessment: Needs assistance ?Sitting-balance support: Bilateral upper extremity supported, Feet supported, No upper extremity supported ?Sitting balance-Leahy Scale: Fair ?Sitting balance - Comments: seated on EOB  upon entry ?   ?Standing balance support: Bilateral upper extremity supported ?Standing balance-Leahy Scale: Poor ?Standing balance comment: stood in stedy to pull up underwear and to transfer to wheelchair, able to stand upright ~15-20 seconds only prior to sitting back down ?  ?  ?  ?  ?  ?  ?  ?  ?  ?  ?  ?  ? ?  ?Cognition Arousal/Alertness: Awake/alert ?Behavior During Therapy: [facility] for tasks assessed/performed ?Overall Cognitive Status: Within Functional Limits for tasks assessed ?  ?  ?  ?  ?  ?  ?  ?  ?  ?  ?  ?  ?  ?  ?  ?  ?General Comments: patient motivated to get out of bed and participate with therapy, much more alert today with improved command following but still needs repetition of some cues, possibly due to R ear HoH ?  ?  ? ?  ?Exercises Amputee Exercises ?Quad Sets: AROM, Seated, 5 reps ?Gluteal Sets: 5 reps, Seated ?Chair Push Up: AROM, 5 reps, Seated (cues for technique, rest breaks between each attempt; pt unable to fully extend triceps and minimal hip lift achieved) ? ?  ?General Comments General comments (skin integrity, edema, etc.): HR 70's-80's bpm, SpO2 93-96% on 2L O2 [place] when sensor reading ?  ?  ? ?Pertinent Vitals/Pain Pain Assessment ?Pain Assessment: Faces ?Faces Pain Scale: Hurts a little bit ?Pain Location: L residual limb ?Pain Descriptors / Indicators: Discomfort, Grimacing ?Pain Intervention(s): Premedicated before session, Monitored during session, Repositioned  ? ? ? ?PT Goals (current goals can now be found in the care plan section) Acute Rehab PT Goals ?Patient Stated Goal: to get better ?PT Goal Formulation: With patient ?Time For Goal Achievement: 06/18/21 ?Progress towards PT goals: Progressing toward goals ? ?  ?Frequency ? ? ? Min 3X/week ? ? ? ?  ?PT Plan Current plan remains appropriate  ? ? ?Co-evaluation PT/OT/SLP Co-Evaluation/Treatment: Yes ?Reason for Co-Treatment: For patient/therapist safety;To address functional/ADL transfers ?PT goals addressed during session:  Mobility/safety with mobility;Balance;Proper use of DME;Strengthening/ROM ?OT goals addressed during session: ADL's and self-care ?  ? ?  ?AM-PAC PT "6 Clicks" Mobility   ?Outcome Measure ? Help needed turning from your back to your side while in a flat bed without using bedrails?: A Little ?Help needed moving from lying on your back to sitting on the side of a flat bed without using bedrails?: A Little ?Help needed moving to and from a bed to a chair (including a wheelchair)?: A Lot ?Help needed standing up from a chair using your arms (e.g., wheelchair or bedside chair)?: Total ?Help needed to walk in hospital room?: Total ?Help needed climbing 3-5 steps with a railing? : Total ?6 Click Score: 11 ? ?  ?End of Session Equipment Utilized During Treatment: Gait belt;Oxygen ?Activity Tolerance: Patient tolerated treatment well ?Patient left: in chair;with call bell/phone within reach;with chair alarm set ?Nurse Communication: Mobility status;Other (comment);Need for lift equipment (pt does well with SB transfer +1 assist, lift pad under her if she is too fatigued to scoot) ?PT Visit Diagnosis: Unsteadiness on feet (R26.81);Muscle weakness (generalized) (M62.81);History of falling (Z91.81);Difficulty in walking, not elsewhere classified (R26.2);Pain ?Pain - Right/Left: Left ?Pain - part of body: Leg ?  ? ? ?Time: 1110-1145 ?PT Time Calculation (min) (ACUTE ONLY): 35 min ? ?Charges:  $Wheel Chair Management: 8-22 mins          ?          ? ?  Safiyya Stokes P., PTA ?Acute Rehabilitation Services ?Secure Chat Preferred 9a-5:30pm ?Office: [phone]  ? ? ?Kara Pacer Katlyne Nishida ?06/09/2021, 3:01 PM ? ?

## 2021-06-09 NOTE — Care Management Important Message (Signed)
Important Message ? ?Patient Details  ?Name: [name] ?MRN: [id number] ?Date of Birth: Jan 31, 1966 ? ? ?Medicare Important Message Given:  Yes ? ? ? ? ?Shelda Altes ?06/09/2021, 8:48 AM ?

## 2021-06-09 NOTE — Progress Notes (Signed)
?PROGRESS NOTE ? ? ? ?Catherine KLINCK  AJO:[id number] DOB: May 17, 1965 DOA: 05/25/2021 ?PCP: Sandi Mariscal, MD  ?Chief Complaint  ?Patient presents with  ? Wound Check  ? ? 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  She had Cindie Rajagopalan chronic wound on her left fifth toe and last 3 days it has been bleeding and draining.  She had x ray on 4/24 with findings with soft tissue swelling and air in the 4th and 5th metatarsals and phalanges worrisome for infection as well as fragmentation secondary to osteomyelitis vs fracture involving the 5th proximal and distal phalanx.  She's s/p aortogram on 4/26 with laser atherectomy of the anterior tibial artery and balloon angioplasty of the anterior tibial artery.  On 4/27, she had amputation of the left 4th and 5th toes including metatarsal bones and sharp excisional debridement L foot of skin and soft tissue measuring 12x7 cm.   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  Hospitalization complicated by enterobacter aerogenes bacteremia, now complete Chrislyn Seedorf course of cefepime.  Hospitalization c/b encephalopathy, daytime sleepiness.  She seems improved with bipap.  Currently awaiting SNF. ? ?See below for additional details    ? ? ?Assessment & Plan: ?  ?Principal Problem: ?  Osteomyelitis ([place]) ?Active Problems: ?  Shortness of breath ?  Acute metabolic encephalopathy ?  Bacteremia due to Enterobacter aerogenes ?  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  Anemia in chronic kidney disease ?  Hypotension ?  Atrial fibrillation with RVR ([place]) ?  Asterixis ?  Hypertension associated with diabetes ([place]) ?  Hyperlipidemia associated with type 2 diabetes mellitus ([place]) ?  Rheumatoid arthritis ([place]) ?  ESRD (end stage  renal disease) ([place]) ?  Type 2 diabetes mellitus with diabetic chronic kidney disease ([place]) ?  Itching ?  Diastolic dysfunction ?  Chronic pain disorder ?  Coronary artery disease ?  Obesity (BMI 30-39.9) ?  Sepsis ([place]) ? ? ?Assessment and Plan: ?Acute metabolic encephalopathy ?Related to opiates, s/p narcan ?Opiates d/c'd ?Still very sleepy, falls asleep during conversation - sister notes this is not much different than home.  ? OSA -> trial of bipap (mental status is improved today) ?Ammonia wnl, VBG without hypercarbia  ?She's got some asterixis, myoclonic jerking -> this is improved. ?She's on cefepime, possible contributor? Stop this, follow EEG -> moderate diffuse encephalopathy. ? ? ?Shortness of breath ?CXR with evidence of edema, possible L effusion ?Currently on 2 L, satting in 90's ?Will continue to monitor, volume per renal  ?Continue nightly bipap, I think this has improved her mental status today ? ?Bacteremia due to Enterobacter aerogenes ?Likely due to osteomyelitis to L foot ?Now s/p amputation ?1/2 sets with enterobacter aerogenes from 4/24 (aerobic bottle only) ?1/2 sets with staph epidermidis (aerobic/anaerobic) - thought contaminant  ?Repeat cx 4/28 and 4/29 no growth  ?Recurrent fever 5/3 PM -> follow repeat cultures (no growth), CXR with consolidation in bases edema with patchy lower zonal haziness (ground glass edema and/or pneumonia) -> repeat CXR 5/7 (hazy perihilar and bibasilar opacification, edema vs infection) ?Enterobacter aerogenes resistant to ancef  ?Cefepime 4/28 - 5/7 (will d/c cefepime today with her persistent AMS and myoclonic jerking/asterixis).   9 days abx after source control on 4/27 should be adequate for gram negative bacteremia. 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 ? ? 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 ? ?Atrial fibrillation with RVR ([place]) ?Improved, rate now controlled ?TSH wnl ?chadvasc at least 4 ?Echo with Ef 60-65%, IV septum flattened in systole, c/w RV pressure overload, RVSF mildly reduced, severely elevated PASP -> increased RVSP from prior study, IV septum flattened in systole noted on 11/2020 echo ?Transitioned to eliquis ?Metop PO and IV ordered with holding parameters ? ?Hypotension ?Improved (5/5 needed bolus and midodrine), will follow ?Midodrine, albumin during dialysis ?Metoprolol ordered for rate control ? ?Anemia in chronic kidney disease ?S/p 5 unit pRBC  ?Follow post transfusion Hb ?Repeat anemia labs - AOCD ? ?Asterixis ?Initially suspected related to opiates in renal dz, but she's been off these several days now ?Suspect OSA/OHS as above, but no hypercarbia - I don't know if this would be typical cause without hypercarbia? ?Seems improved after bipap ?VBG without hypercarbia ?Continue to hold opiates ?Stop cefepime today, following EEG with her myoclonic jerks (as above) ? ?Hypertension associated with diabetes ([place]) ?Metop with holding parameters ? ?Hyperlipidemia associated with type 2 diabetes mellitus ([place]) ?rosuvastatin ? ?Rheumatoid arthritis ([place]) ?humira on hold ? ?ESRD (end stage renal disease) ([place]) ?Renal consulted ? ? ?Type 2 diabetes mellitus with diabetic chronic kidney disease ([place]) ?SSI, 70/30 ? ?Itching ?improved ? ? ?DVT prophylaxis: eliquis ?Code Status: full ?Family Communication: none ?Disposition:   ? ?Status is: Inpatient ?Remains inpatient appropriate because: awaiting placement ?  ?Consultants:  ?Nephrology ?vascular ? ?Procedures:  ?4/26 ?Procedure Performed: ?1.  Ultrasound-guided micropuncture access of the right common femoral artery ?2.  Aortogram ?3.  Second-order cannulation, left lower extremity angiogram ?4.  Laser atherectomy of the anterior tibial artery ?5.  Balloon angioplasty of the anterior tibial artery ?6.  Device assisted closure-M[employer] ?  ?4/27 ?Amputation of the left fourth and fifth toes including metatarsal bones and sharp excisional debridement left foot of skin and soft tissue measuring 12 x 7 cm ?  ?5/2 ?Left below-knee amputation ?  ?EEG ?IMPRESSION: ?This study is suggestive of moderate diffuse encephalopathy, nonspecific etiology. No seizures or epileptiform discharges were seen throughout the recording. ? ?Echo ?IMPRESSIONS  ? ? ? 1. Left ventricular ejection fraction, by estimation, is 60 to 65%. The  ?left ventricle has normal function. The left ventricle has no regional  ?wall motion abnormalities. There is moderate left ventricular hypertrophy.  ?Left ventricular diastolic function  ? could not be evaluated. There is the interventricular septum is flattened  ?in systole, consistent with right ventricular pressure overload.  ? 2. Right ventricular systolic function is mildly reduced. The right  ?ventricular size is mildly enlarged. There is severely elevated pulmonary  ?artery systolic pressure.  ? 3. The mitral valve is grossly normal. Mild mitral valve regurgitation.  ? 4. Tricuspid valve regurgitation is moderate.  ? 5. Aortic valve regurgitation is not visualized.  ? 6. Aortic  no significant aortic aneurysm.  ? ?Conclusion(s)/Recommendation(s): RVSP has increased from prior study. ? ?Antimicrobials:  ?Anti-infectives (From admission, onward)  ? ? Start     Dose/Rate Route Frequency Ordered Stop  ? 06/08/21 1200  ceFEPIme (MAXIPIME) 2 g in sodium chloride 0.9 % 100 mL IVPB   Status:  Discontinued       ? 2 g ?200 mL/hr over 30 Minutes Intravenous Every M-W-F (Hemodialysis) 06/07/21 1332 06/08/21 0922  ? 05/29/21 0845  ceFEPIme (MAXIPIME) 2 g in sodium chloride 0.9 % 100 mL IV

## 2021-06-09 NOTE — Progress Notes (Addendum)
[facility] ? ?Subjective  - Doing well this am ? ? ?Objective ?129/71 ?79 ?98.2 ?F (36.8 ?C) (Oral) ?20 ?96% ? ?Intake/Output Summary (Last 24 hours) at 06/09/2021 0731 ?Last data filed at 06/08/2021 2123 ?Gross per 24 hour  ?Intake 1348.18 ml  ?Output 4000 ml  ?Net -2651.82 ml  ? ? ?Left BKA healing well left open to air ?Good knee mobility ?Lungs non labored breathing ? ? ?Assessment/Planning: ?S/P left BKA ?Healing well will maintain staples and nylon sutures for 4 weeks ?Wear limb protector for PT ?Will order stump sock from [facility] ? ?Roxy Horseman ?06/09/2021 ?7:31 AM ?-- ? ?VASCULAR STAFF ADDENDUM: ?I have independently interviewed and examined the patient. ?I agree with the above.  ?Vascular will se intermittently at this point as the wound is progressing appropriately.  ?Please continue daily dry dressing changes.  ?Available should any questions of concerns arise.  ?Please contact prior to dispo to ensure short follow up in scheduled accordingly ? ?J. Melene Muller, MD ?[facility] ?Office Phone Number: [phone] ?06/09/2021 9:26 AM ? ? ? ?Laboratory ?Lab Results: ?Recent Labs  ?  06/08/21 ?0300 06/09/21 ?0102  ?WBC 9.7 8.8  ?HGB 7.8* 8.6*  ?HCT 26.2* 27.3*  ?PLT 261 247  ? ?BMET ?Recent Labs  ?  06/08/21 ?0300 06/09/21 ?0102  ?NA 131* 132*  ?K 5.0 4.4  ?CL 93* 94*  ?CO2 22 25  ?GLUCOSE 135* 189*  ?BUN 52* 34*  ?CREATININE 7.01* 4.96*  ?CALCIUM 8.6* 8.6*  ? ? ?COAG ?Lab Results  ?Component Value Date  ? INR 1.0 11/14/2018  ? INR 0.90 01/30/2011  ? ?No results found for: PTT ? ? ? ?

## 2021-06-10 LAB — CBC WITH DIFFERENTIAL/PLATELET
Abs Immature Granulocytes: 0.39 10*3/uL — ABNORMAL HIGH (ref 0.00–0.07)
Basophils Absolute: 0 10*3/uL (ref 0.0–0.1)
Basophils Relative: 0 %
Eosinophils Absolute: 0.3 10*3/uL (ref 0.0–0.5)
Eosinophils Relative: 4 %
HCT: 27.2 % — ABNORMAL LOW (ref 36.0–46.0)
Hemoglobin: 8.4 g/dL — ABNORMAL LOW (ref 12.0–15.0)
Immature Granulocytes: 5 %
Lymphocytes Relative: 13 %
Lymphs Abs: 1.1 10*3/uL (ref 0.7–4.0)
MCH: 30.4 pg (ref 26.0–34.0)
MCHC: 30.9 g/dL (ref 30.0–36.0)
MCV: 98.6 fL (ref 80.0–100.0)
Monocytes Absolute: 0.7 10*3/uL (ref 0.1–1.0)
Monocytes Relative: 8 %
Neutro Abs: 5.8 10*3/uL (ref 1.7–7.7)
Neutrophils Relative %: 70 %
Platelets: 218 10*3/uL (ref 150–400)
RBC: 2.76 MIL/uL — ABNORMAL LOW (ref 3.87–5.11)
RDW: 16.2 % — ABNORMAL HIGH (ref 11.5–15.5)
WBC: 8.2 10*3/uL (ref 4.0–10.5)
nRBC: 1 % — ABNORMAL HIGH (ref 0.0–0.2)

## 2021-06-10 LAB — COMPREHENSIVE METABOLIC PANEL
ALT: 26 U/L (ref 0–44)
AST: 29 U/L (ref 15–41)
Albumin: 2.7 g/dL — ABNORMAL LOW (ref 3.5–5.0)
Alkaline Phosphatase: 179 U/L — ABNORMAL HIGH (ref 38–126)
Anion gap: 13 (ref 5–15)
BUN: 48 mg/dL — ABNORMAL HIGH (ref 6–20)
CO2: 24 mmol/L (ref 22–32)
Calcium: 8.6 mg/dL — ABNORMAL LOW (ref 8.9–10.3)
Chloride: 94 mmol/L — ABNORMAL LOW (ref 98–111)
Creatinine, Ser: 6.58 mg/dL — ABNORMAL HIGH (ref 0.44–1.00)
GFR, Estimated: 7 mL/min — ABNORMAL LOW (ref >60)
Glucose, Bld: 145 mg/dL — ABNORMAL HIGH (ref 70–99)
Potassium: 4.5 mmol/L (ref 3.5–5.1)
Sodium: 131 mmol/L — ABNORMAL LOW (ref 135–145)
Total Bilirubin: 0.5 mg/dL (ref 0.3–1.2)
Total Protein: 7.7 g/dL (ref 6.5–8.1)

## 2021-06-10 LAB — MAGNESIUM: Magnesium: 2 mg/dL (ref 1.7–2.4)

## 2021-06-10 LAB — GLUCOSE, CAPILLARY
Glucose-Capillary: 121 mg/dL — ABNORMAL HIGH (ref 70–99)
Glucose-Capillary: 130 mg/dL — ABNORMAL HIGH (ref 70–99)
Glucose-Capillary: 283 mg/dL — ABNORMAL HIGH (ref 70–99)
Glucose-Capillary: 233 mg/dL — ABNORMAL HIGH (ref 70–99)

## 2021-06-10 LAB — PHOSPHORUS: Phosphorus: 3.9 mg/dL (ref 2.5–4.6)

## 2021-06-10 MED ORDER — SODIUM CHLORIDE 0.9 % IV SOLN: 100.0000 mL | INTRAVENOUS | Status: DC | PRN

## 2021-06-10 MED ORDER — ALTEPLASE 2 MG IJ SOLR: 2.0000 mg | Freq: Once | INTRAMUSCULAR | Status: DC | PRN

## 2021-06-10 MED ORDER — LIDOCAINE HCL (PF) 1 % IJ SOLN: 5.0000 mL | INTRAMUSCULAR | Status: DC | PRN

## 2021-06-10 MED ORDER — HEPARIN SODIUM (PORCINE) 1000 UNIT/ML DIALYSIS: 1000.0000 [IU] | INTRAMUSCULAR | Status: DC | PRN

## 2021-06-10 MED ORDER — PENTAFLUOROPROP-TETRAFLUOROETH EX AERO: 1.0000 "application " | INHALATION_SPRAY | CUTANEOUS | Status: DC | PRN

## 2021-06-10 MED ORDER — OXYCODONE HCL 5 MG PO TABS: 5.0000 mg | ORAL_TABLET | Freq: Four times a day (QID) | ORAL | Status: DC | PRN

## 2021-06-10 MED ORDER — LIDOCAINE-PRILOCAINE 2.5-2.5 % EX CREA: 1.0000 "application " | TOPICAL_CREAM | CUTANEOUS | Status: DC | PRN

## 2021-06-10 NOTE — Progress Notes (Signed)
Mobility Specialist Progress Note ? ? 06/10/21 1630  ?Mobility  ?Activity Turned to back - supine ?(Bed Level Exercises)  ?Level of Assistance Standby assist, set-up cues, supervision of patient - no hands on  ?Assistive Device None  ?LLE Weight Bearing NWB  ?Activity Response Tolerated well  ?$Mobility charge 1 Mobility  ? ?Pre Mobility: 78 HR, 108/63 BP, 93% SpO2 ?During Mobility: 81 HR, 91% SpO2 ?Post Mobility: 74 HR, 124/65 BP, 92%SpO2 ? ?Received in bed having no c/o and agreeable to bed level exercises. No faults during but requiring VC on proper breathing techniques. Pt also demonstrating good/moderate strength in all extremities but c/o R shoulder pain d/t arthritis. Placed on EOB w/ call bell in reach and all needs met.  ? ?Holland Falling ?Mobility Specialist ?Phone Number [phone] ? ?

## 2021-06-10 NOTE — Progress Notes (Signed)
?[facility] ?Progress Note  ? ?Subjective:    ?Seen and examined patient at bedside during HD.  No new complaints.  Denies dyspnea, CP, f/c.  ? ?Objective ?Vitals:  ? 06/10/21 0648 06/10/21 0658 06/10/21 0730 06/10/21 0800  ?BP: 126/69 138/75 123/61 126/61  ?Pulse: 73 71 67 66  ?Resp: (!) 22 20  (!) 23  ?Temp: 97.7 ?F (36.5 ?C)     ?TempSrc:      ?SpO2:      ?Weight: 116 kg     ?Height:      ? ?Physical Exam ?General: chronically ill appearing, NAD ?Heart: RRR, no mrg ?Lungs: CTAB, nml WOB ?Abdomen: soft, NTND ?Extremities: trace to 1+ LE edema, woody appearance. L BKA incision c/d/i ?Dialysis Access: RU AVF +b/t   ? ?Filed Weights  ? 06/09/21 0114 06/10/21 0500 06/10/21 0648  ?Weight: 113.1 kg 116 kg 116 kg  ? ? ?Intake/Output Summary (Last 24 hours) at 06/10/2021 0817 ?Last data filed at 06/09/2021 1900 ?Gross per 24 hour  ?Intake 480 ml  ?Output --  ?Net 480 ml  ? ? ? ?Additional Objective ?Labs: ?Basic Metabolic Panel: ?Recent Labs  ?Lab 06/08/21 ?0300 06/09/21 ?0102 06/10/21 ?0134  ?NA 131* 132* 131*  ?K 5.0 4.4 4.5  ?CL 93* 94* 94*  ?CO2 '22 25 24  '$ ?GLUCOSE 135* 189* 145*  ?BUN 52* 34* 48*  ?CREATININE 7.01* 4.96* 6.58*  ?CALCIUM 8.6* 8.6* 8.6*  ?PHOS 4.9* 3.2 3.9  ? ? ?Liver Function Tests: ?Recent Labs  ?Lab 06/07/21 ?0142 06/08/21 ?0300 06/09/21 ?0102 06/10/21 ?0134  ?AST 128*  --  44* 29  ?ALT 66*  --  37 26  ?ALKPHOS 199*  --  191* 179*  ?BILITOT 0.7  --  0.7 0.5  ?PROT 7.4  --  7.7 7.7  ?ALBUMIN 2.5* 2.6* 2.6* 2.7*  ? ? ?No results for input(s): LIPASE, AMYLASE in the last 168 hours. ?CBC: ?Recent Labs  ?Lab 06/06/21 ?3846 06/07/21 ?0142 06/08/21 ?0300 06/09/21 ?0102 06/10/21 ?0134  ?WBC 9.8 9.4 9.7 8.8 8.2  ?NEUTROABS 7.5 6.6  --  6.8 5.8  ?HGB 7.6* 7.4* 7.8* 8.6* 8.4*  ?HCT 24.6* 24.2* 26.2* 27.3* 27.2*  ?MCV 95.0 96.4 98.5 97.5 98.6  ?PLT 260 266 261 247 218  ? ? ?Blood Culture ?   ?Component Value Date/Time  ? SDES BLOOD BLOOD LEFT FOREARM 06/04/2021 1840  ? SPECREQUEST  06/04/2021 1840   ?  BOTTLES DRAWN AEROBIC ONLY Blood Culture results may not be optimal due to an inadequate volume of blood received in culture bottles  ? CULT  06/04/2021 1840  ?  NO GROWTH 5 DAYS ?Performed at [facility], [place] [address] [place], [place] [address] ?  ? REPTSTATUS 06/09/2021 FINAL 06/04/2021 1840  ? ? ?Cardiac Enzymes: ?No results for input(s): CKTOTAL, CKMB, CKMBINDEX, TROPONINI in the last 168 hours. ?CBG: ?Recent Labs  ?Lab 06/09/21 ?5701 06/09/21 ?7793 06/09/21 ?1712 06/09/21 ?2152 06/10/21 ?0604  ?GLUCAP 141* 174* 172* 247* 121*  ? ? ?Iron Studies:  ?No results for input(s): IRON, TIBC, TRANSFERRIN, FERRITIN in the last 72 hours. ? ?Lab Results  ?Component Value Date  ? INR 1.0 11/14/2018  ? INR 0.90 01/30/2011  ? ?Studies/Results: ?EEG adult ? ?Result Date: 06/08/2021 ?Lora Havens, MD     06/08/2021  3:00 PM Patient Name: [name] MRN: [id number] Epilepsy Attending: Lora Havens Referring Physician/Provider: Elodia Florence., MD Date: 06/08/2021 Duration: 21.30 mins Patient history: 56 year old female with  altered mental status.  EEG to evaluate for seizure. Level of alertness: lethargic AEDs during EEG study: None Technical aspects: This EEG study was done with scalp electrodes positioned according to the 10-20 International system of electrode placement. Electrical activity was acquired at a sampling rate of '500Hz'$  and reviewed with a high frequency filter of '70Hz'$  and a low frequency filter of '1Hz'$ . EEG data were recorded continuously and digitally stored. Description: No posterior dominant rhythm was seen. EEG showed continuous generalized 3 to 6 Hz theta-delta slowing. Hyperventilation and photic stimulation were not performed.   ABNORMALITY - Continuous slow, generalized IMPRESSION: This study is suggestive of moderate diffuse encephalopathy, nonspecific etiology. No seizures or epileptiform discharges were seen throughout the recording. Priyanka Barbra Sarks   ? ?Medications: ?  sodium chloride    ? ? (feeding supplement) PROSource Plus  30 mL Oral BID BM  ? sodium chloride   Intravenous Once  ? sodium chloride   Intravenous Once  ? apixaban  5 mg Oral BID  ? Chlorhexidine Gluconate Cloth  6 each Topical Q0600  ? clopidogrel  75 mg Oral Q breakfast  ? docusate sodium  100 mg Oral Daily  ? insulin aspart  0-5 Units Subcutaneous QHS  ? insulin aspart  0-6 Units Subcutaneous TID WC  ? insulin aspart protamine- aspart  28 Units Subcutaneous BID WC  ? metoprolol tartrate  12.5 mg Oral BID  ? pantoprazole  40 mg Oral Daily  ? pramipexole  0.125 mg Oral QHS  ? rosuvastatin  40 mg Oral Daily  ? sodium chloride flush  3 mL Intravenous Q12H  ? sodium chloride flush  3 mL Intravenous Q12H  ? ? ?Dialysis Orders: ?MWF  - GOC ?4hrs, BFR 450, DFR 800,  EDW 110.6kg, 2K/ 2.5Ca, RU AVF,  ?-Heparin 10000 unit IV bolus TIW ? - No ESA 2/2 ?malignancy (no diagnosis - has stable renal lesion, pulm lesions -> saw onc 4/17, plan is for bronchoscopy with Bx of lung nodules in near future) ?- Calcitriol 2.5 mcg PO qHD ?- Parsabiv 7.5 mg IV qHD  ? 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  S/p balloon angioplasty and laser atherectomy to LATA 4/26, then amputation of L 4th/5th metarsals 05/28/2021 Dr. Donzetta Matters. 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  ?Afib with RVR- new onset 5/6 after HD. Remains in SR. Continue Metoprolol.  Now on eliquis.  ?Volume overload:  Continue lowering volume as tolerated with HD. Well over EDW but bed weight.  After BKA will need EDW adjusted outpt.  4L UF today - midodrine given.  ?ESRD: Continue HD per usual MWF schedule.  ?Hypertension: BP variable - normal this AM pre HD. On metoprolol for rate control. Has had hypotension with HD, can utilizing midodrine with HD for BP support.   ?Anemia of ESRD: Hgb 7 on admit, s/p 5 units PRBCs total during hospitalization so far. Hb 8.4.  Follow trends. Not getting outpatient ESA d/t  potential malignancy. Transfusion for Hgb <7.  ?Secondary Hyperparathyroidism: Ca/Phos ok. Continue VDRA on MWF.  Unable to give parsabiv not on formulary.  Continue binders. ?Nutrition - Renal diet w/fluid restrictions.  ?DMT2 - per PMD ? ?Jannifer Hick MD ?[facility] ?Pager [phone] ? ?  ?

## 2021-06-10 NOTE — Progress Notes (Signed)
Mobility Specialist Progress Note ? ? 06/10/21 1220  ?Mobility  ?Activity Dangled on edge of bed  ?Level of Assistance Standby assist, set-up cues, supervision of patient - no hands on  ?Assistive Device None  ?Activity Response Tolerated well  ?$Mobility charge 1 Mobility  ? ?Pre Mobility: 103 HR, 120/62 BP ?During Mobility: 143 HR ?Post Mobility: 94 HR ? ?Received pt just getting back from HD but stating to be feeling well and agreeable. Sessions's focus today was extremity strengthening w/ bed exercises. Prior to starting, pt's HR was increasing and decreasing abnormally presenting w/ Afib the entire time, HR reached a max of 143 bpm prior to mobility. Rest and waited until pt's HR was <100bpm to start,  Pt able to tolerate and perform x2 exercises w/o complaint but session cut short d/t pt's lunch arriving and pt preferring to stop so that they can eat lunch while it's hot. Will f/u later this afternoon to continue session if time permits. Notified RN about HR. ? ?Holland Falling ?Mobility Specialist ?Phone Number [phone] ? ?

## 2021-06-10 NOTE — TOC Progression Note (Signed)
Transition of Care (TOC) - Progression Note  ? ? ?Patient Details  ?Name: [name] ?MRN: [id number] ?Date of Birth: Jun 07, 1965 ? ?Transition of Care (TOC) CM/SW Contact  ?Vinie Sill, LCSW ?Phone Number: ?06/10/2021, 2:02 PM ? ?Clinical Narrative:    ? ?CSW has received insurance approval  ?[phone] ?5/10-5/12 ? ?[facility] states they should have patient's bipap tomorrow ? ?[name], MSW, LCSW ?Clinical Social Worker ? ? ? ?Expected Discharge Plan: [place] ?Barriers to Discharge:  (SNF has to get bipap before patient can d/c there) ? ?Expected Discharge Plan and Services ?Expected Discharge Plan: [place] ?In-house Referral: Clinical Social Work ?  ?  ?Living arrangements for the past 2 months: [place] ?                ?  ?  ?  ?  ?  ?  ?  ?  ?  ?  ? ? ?Social Determinants of Health (SDOH) Interventions ?  ? ?Readmission Risk Interventions ? ?  02/28/2020  ?  1:08 PM  ?Readmission Risk Prevention Plan  ?Transportation Screening Complete  ?PCP or Specialist Appt within 5-7 Days Complete  ?Home Care Screening Complete  ?Medication Review (RN CM) Complete  ? ? ?

## 2021-06-10 NOTE — Progress Notes (Signed)
Wore bipap from about 12a-6a and tolerated well. Placed back on 2L nasal cannula.  ?

## 2021-06-10 NOTE — Progress Notes (Signed)
removed 4066ms net fluid.  complained of shoulder pain gave tylenol.  benadryl for itching and midodrine for bp support.  pt would not keep fistula arm still 100 plus alarms,  2 bandages to rua avf no bleeding dressing cdi.  pre bp 126/69  post bp 125/81 pre weight 116.0kg post weight 112.0kg bed scales. ?

## 2021-06-10 NOTE — TOC Progression Note (Signed)
Transition of Care (TOC) - Progression Note  ? ? ?Patient Details  ?Name: [name] ?MRN: [id number] ?Date of Birth: Jun 04, 1965 ? ?Transition of Care (TOC) CM/SW Contact  ?Vinie Sill, LCSW ?Phone Number: ?06/10/2021, 11:06 AM ? ?Clinical Narrative:    ? ?Insurance remains pending ?[facility] confirmed bed offer- per SNF, they will have bipap by tomorrow - CSW sent bipap order and settings as requested  ? ?TOC will continue to follow and assist with discharge planning. ? ?[name], MSW, LCSW ?Clinical Social Worker ? ? ? ?Expected Discharge Plan: [place] ?Barriers to Discharge: Insurance Authorization (waiting on confirmation of bed offer form [facility]) ? ?Expected Discharge Plan and Services ?Expected Discharge Plan: [place] ?In-house Referral: Clinical Social Work ?  ?  ?Living arrangements for the past 2 months: [place] ?                ?  ?  ?  ?  ?  ?  ?  ?  ?  ?  ? ? ?Social Determinants of Health (SDOH) Interventions ?  ? ?Readmission Risk Interventions ? ?  02/28/2020  ?  1:08 PM  ?Readmission Risk Prevention Plan  ?Transportation Screening Complete  ?PCP or Specialist Appt within 5-7 Days Complete  ?Home Care Screening Complete  ?Medication Review (RN CM) Complete  ? ? ?

## 2021-06-10 NOTE — Progress Notes (Signed)
Patient brought back to 4E from dialysis. VSS. ? ?Daymon Larsen, RN  ?

## 2021-06-10 NOTE — Progress Notes (Signed)
?PROGRESS NOTE ? ?[name] YTK:[id number] DOB: 1966-01-16 DOA: 05/25/2021 ?PCP: Sandi Mariscal, MD ? ? LOS: 15 days  ? 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  She had a chronic wound on her left fifth toe and last 3 days it has been bleeding and draining.  She had x ray on 4/24 with findings with soft tissue swelling and air in the 4th and 5th metatarsals and phalanges worrisome for infection as well as fragmentation secondary to osteomyelitis vs fracture involving the 5th proximal and distal phalanx.  She's s/p aortogram on 4/26 with laser atherectomy of the anterior tibial artery and balloon angioplasty of the anterior tibial artery.  On 4/27, she had amputation of the left 4th and 5th toes including metatarsal bones and sharp excisional debridement L foot of skin and soft tissue measuring 12x7 cm.   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  Hospitalization complicated by enterobacter aerogenes bacteremia, now complete a course of cefepime.  Hospitalization c/b encephalopathy, daytime sleepiness.  She seems improved with bipap.  Currently awaiting SNF. ? ?Subjective / 24h Interval events: ?Seen in dialysis.  Asking about rehab ? ?Assesement and Plan: ?Principal Problem: ?  Osteomyelitis ([place]) ?Active Problems: ?  Shortness of breath ?  Acute metabolic encephalopathy ?  Bacteremia due to Enterobacter aerogenes ?  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  Anemia in chronic kidney disease ?  Hypotension ?  Atrial fibrillation with RVR ([place]) ?  Asterixis ?  Hypertension associated with diabetes ([place]) ?  Hyperlipidemia associated with type 2 diabetes mellitus ([place]) ?  Rheumatoid arthritis ([place]) ?  ESRD (end  stage renal disease) ([place]) ?  Type 2 diabetes mellitus with diabetic chronic kidney disease ([place]) ?  Itching ?  Diastolic dysfunction ?  Chronic pain disorder ?  Coronary artery disease ?  Obesity (BMI 30-39.9) ?  Sepsis ([place]) ? ? 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  She also underwent left fourth and fifth toes amputation on 4/27.  1/2 sets with Enterobacter aerogenes from aerobic bottle only, and 1/2 sets with Staph epidermidis (aerobic and anaerobic)-thought to be contaminant.  Repeat cultures 4/28 and 4/29 no growth.  She has been maintained on cefepime for 9 days, felt like he was adequate coverage postoperatively and cefepime was discontinued 5/9 due to concern for contributing to her encephalopathy. ? ?Active problems ?Acute metabolic encephalopathy -postoperatively, possibly related to opiates, s/p narcan, she was also given a trial of BiPAP, mental status seems to be improving.  There is no clear-cut evidence of hypercarbia.  In addition, she was on cefepime for bacteremia as below and this was discontinued.  Underwent an EEG which showed moderate diffuse encephalopathy ? ?Shortness of breath -CXR with evidence of edema, possible L effusion.  Volume management per nephrology.  She was started on BiPAP while here, mental status is better, however I doubt she will qualify for home BiPAP ?  ?Left foot wound with unreconstructable vascular disease -Critical limb ischemia of LLE ?S/p US guided micropuncture acess of R common femoral artery, aortogram, second order cannulation LLE angiogram, laser atherectomy of anterior tibial artery, balloon angioplasty of anterior tibial artery,  devise assisted closure - mynx on 4/26. 4/27 amputation of L 4th and 5th toes including metatarsal bones and sharp excisional debridement L foot of skin and soft tissue  measuring 12x7 cm. Now s/p L BKA 5/2 -> stump viable, suture line intact, healthy ?Appreciate vascular recs  ?  ?Atrial fibrillation with RVR ([facility]) -Improved, rate now controlled, TSH wnl.  Echo with Ef 60-65%, IV septum flattened in systole, c/w RV pressure overload, RVSF mildly reduced, severely elevated PASP -> increased RVSP from prior study, IV septum flattened in systole noted on 11/2020 echo.  Continue metoprolol, Eliquis ? ?Hypotension -Improved (5/5 needed bolus and midodrine), will follow, Midodrine, albumin during dialysis ?  ?Anemia in chronic kidney disease, postop acute blood loss anemia-S/p 5 unit pRBC.  Hemoglobin now stable ?  ?Asterixis -Initially suspected related to opiates in renal dz, but she's been off these several days now, Suspect OSA/OHS as above, but no hypercarbia. Seems better after BiPAP ? ?Hypertension associated with diabetes ([place]) -Metop with holding parameters ?  ?Hyperlipidemia associated with type 2 diabetes mellitus (HCC) -rosuvastatin ?  ?Rheumatoid arthritis ([place]) -humira on hold ?  ?ESRD (end stage renal disease) ([place]) -Renal consulted ?  ?Type 2 diabetes mellitus with diabetic chronic kidney disease ([facility]CC) -SSI, 70/30 ? ?CBG (last 3)  ?Recent Labs  ?  06/09/21 ?2152 06/10/21 ?0604 06/10/21 ?1150  ?GLUCAP 247* 121* 130*  ?  ? ?Itching -improved ? ?Scheduled Meds: ? (feeding supplement) PROSource Plus  30 mL Oral BID BM  ? sodium chloride   Intravenous Once  ? sodium chloride   Intravenous Once  ? apixaban  5 mg Oral BID  ? Chlorhexidine Gluconate Cloth  6 each Topical Q0600  ? clopidogrel  75 mg Oral Q breakfast  ? docusate sodium  100 mg Oral Daily  ? insulin aspart  0-5 Units Subcutaneous QHS  ? insulin aspart  0-6 Units Subcutaneous TID WC  ? insulin aspart protamine- aspart  28 Units Subcutaneous BID WC  ? metoprolol tartrate  12.5 mg Oral BID  ? pantoprazole  40 mg Oral Daily  ? pramipexole  0.125 mg Oral QHS  ? rosuvastatin  40 mg Oral Daily  ? sodium chloride flush  3  mL Intravenous Q12H  ? sodium chloride flush  3 mL Intravenous Q12H  ? ?Continuous Infusions: ? sodium chloride    ? ?PRN Meds:.sodium chloride, acetaminophen **OR** acetaminophen, acetaminophen, diphenhydrAMINE, levalbuterol, metoprolol tartrate, midodrine, naLOXone (NARCAN)  injection, ondansetron (ZOFRAN) IV, oxyCODONE, polyethylene glycol, sodium chloride flush ? ?Diet Orders (From admission, onward)  ? ?  Start     Ordered  ? 06/09/21 3846  Diet regular Room service appropriate? Yes with Assist; Fluid consistency: Thin; Fluid restriction: 1500 mL Fluid  Diet effective now       ?Question Answer Comment  ?Room service appropriate? Yes with Assist   ?Fluid consistency: Thin   ?Fluid restriction: 1500 mL Fluid   ?  ? 06/09/21 0951  ? ?  ?  ? ?  ? ? ?DVT prophylaxis: SCD's Start: 06/02/21 1416 ?apixaban (ELIQUIS) tablet 5 mg  ? ?Lab Results  ?Component Value Date  ? PLT 218 06/10/2021  ? ? ?  Code Status: Full Code ? ?Family Communication: no family at bedside  ? ?Status is: Inpatient ?Remains inpatient appropriate because: awaiting placement ? ?Level of care: Progressive Cardiac ? ?Consultants:  ?Nephrology  ?vascular surgery  ? ?Objective: ?Vitals:  ? 06/10/21 1000 06/10/21 1028 06/10/21 1146 06/10/21 1148  ?BP: 122/70 120/69  102/69  ?Pulse: (!) 114 (!) 114  98  ?Resp:  (!) '27 20 19  '$ ?Temp:  97.9 ?F (36.6 ?C)  98.1 ?F (36.7 ?C)  ?TempSrc:    Oral  ?SpO2:    99%  ?Weight:  115.1 kg    ?Height:      ? ? ?Intake/Output Summary (Last 24 hours) at 06/10/2021 1220 ?Last data filed at 06/10/2021 1028 ?Gross per 24 hour  ?Intake 480 ml  ?Output 4000 ml  ?Net -3520 ml  ? ?Wt Readings from Last 3 Encounters:  ?06/10/21 115.1 kg  ?04/26/21 119.9 kg  ?04/21/21 110 kg  ? ? ?Examination: ? ?Constitutional: NAD ?Eyes: no scleral icterus ?ENMT: Mucous membranes are moist.  ?Neck: normal, supple ?Respiratory: clear to auscultation bilaterally, no wheezing, no crackles.  ?Cardiovascular: Regular rate and rhythm, no murmurs / rubs /  gallops.  ?Abdomen: non distended, no tenderness. Bowel sounds positive.  ?Musculoskeletal: no clubbing / cyanosis.  ?Skin: no rashes ?Neurologic: non focal  ? ? ?Data Reviewed: I have independently revie

## 2021-06-11 LAB — GLUCOSE, CAPILLARY: Glucose-Capillary: 139 mg/dL — ABNORMAL HIGH (ref 70–99)

## 2021-06-11 MED ORDER — CLOPIDOGREL BISULFATE 75 MG PO TABS: 75.0000 mg | ORAL_TABLET | Freq: Every day | ORAL | Status: AC

## 2021-06-11 MED ORDER — APIXABAN 5 MG PO TABS: 5.0000 mg | ORAL_TABLET | Freq: Two times a day (BID) | ORAL | Status: AC

## 2021-06-11 MED ORDER — OXYCODONE-ACETAMINOPHEN 10-325 MG PO TABS: 1.0000 | ORAL_TABLET | Freq: Three times a day (TID) | ORAL | 0 refills | Status: DC | PRN

## 2021-06-11 MED ORDER — MIDODRINE HCL 10 MG PO TABS: 10.0000 mg | ORAL_TABLET | ORAL | Status: DC | PRN

## 2021-06-11 MED ORDER — METOPROLOL TARTRATE 25 MG PO TABS: 12.5000 mg | ORAL_TABLET | Freq: Two times a day (BID) | ORAL | Status: AC

## 2021-06-11 NOTE — Progress Notes (Signed)
D/C order noted. Contacted Emilie Rutter to advise clinic of pt's d/c today and that pt will resume care tomorrow. Clinic also advised that pt will be going to snf at d/c.  ? ?Melven Sartorius ?Renal Navigator ?[phone] ?

## 2021-06-11 NOTE — Progress Notes (Addendum)
Physical Therapy Treatment ?Patient Details ?Name: [name] ?MRN: [id number] ?DOB: 07-14-1965 ?Today's Date: 06/11/2021 ? ? ?History of Present Illness 56 y.o. female presented 05/25/21 with infected L 5th toe. S/p L leg aortogram, angiogram, laser atherectomy of the anterior tibial artery, and balloon angioplasty of anterior tibial artery 4/26. 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 PMH: hypertension, diabetes, hyperlipidemia, CHF, ESRD on HD, chronic pain, CAD, anemia, Buerger disease, rheumatoid arthritis ? ?  ?PT Comments  ? ? Pt received seated EOB, pleasantly agreeable to therapy session and son Vonna Kotyk present and encouraging. Pt eager to progress standing trials with RW and able to perform x4 total trials at RW and then with Stedy (x2 including from [place]). Pt up to recliner via Stedy at end of session, pt unable to stand long enough to attempt stand pivot transfer to chair. Pt performed seated reaching/scooting for strengthening/balance and making good progress toward goals as evidenced by increased tolerance for standing trials x6 successful today. Pt did need bed height significantly elevated to stand fully upright at RW. Pt continues to benefit from PT services to progress toward functional mobility goals.    ?Recommendations for follow up therapy are one component of a multi-disciplinary discharge planning process, led by the attending physician.  Recommendations may be updated based on patient status, additional functional criteria and insurance authorization. ? ?Follow Up Recommendations ? Skilled nursing-short term rehab (<3 hours/day) (would recommend AIR but insurance won't approve it) ?  ?  ?Assistance Recommended at Discharge Frequent or constant Supervision/Assistance  ?Patient can return home with the following Two people to help with walking and/or transfers;A lot of help with bathing/dressing/bathroom;Assistance with cooking/housework;Assist for  transportation;Help with stairs or ramp for entrance ?  ?Equipment Recommendations ? BSC/3in1;Rolling walker (2 wheels) (drop-arm BSC, current RW is broken)  ?  ?Recommendations for Other Services   ? ? ?  ?Precautions / Restrictions Precautions ?Precautions: Fall ?Precaution Comments: new L BKA 06/02/21 ?Required Braces or Orthoses: Other Brace ?Other Brace: limb guard ?Restrictions ?Weight Bearing Restrictions: Yes ?LLE Weight Bearing: Non weight bearing  ?  ? ?Mobility ? Bed Mobility ?Overal bed mobility: Needs Assistance ?  ?  ?  ?  ?  ?  ?General bed mobility comments: seated on EOB upon entry ?  ? ?Transfers ?Overall transfer level: Needs assistance ?Equipment used: Rolling walker (2 wheels), Ambulation equipment used ?Transfers: Sit to/from Stand, Bed to chair/wheelchair/BSC ?Sit to Stand: +2 physical assistance, From elevated surface, Max assist ?  ?  ?  ?  ?  ?General transfer comment: Patient performed 4 stands from EOB to RW and used Stedy to transfer to recliner. Consistent maxA for sit>stand from each surface height. Needed EOB significantly elevated after first attempt to stand when she couldn't get fully upright; minA for posterior/lateral scooting along EOB ?Transfer via Lift Equipment: Stedy ? ?Ambulation/Gait ?  ?  ?  ?  ?  ?  ?  ?General Gait Details: deferred -pt unable to hop or pivot in stance ? ?  ?Balance Overall balance assessment: Needs assistance ?Sitting-balance support: Bilateral upper extremity supported, Feet supported, No upper extremity supported ?Sitting balance-Leahy Scale: Fair ?Sitting balance - Comments: seated on EOB upon entry; able to reach outside BOS 2-3" with BUE anterior/laterally ?  ?Standing balance support: Bilateral upper extremity supported ?Standing balance-Leahy Scale: Poor ?Standing balance comment: Stood from EOB x4 to RW with max assist x2 and stood a fifth time in stedy for transfer  to recliner ?  ?  ?  ?  ?  ?  ?  ?  ?  ?  ?  ?  ? ?  ?Cognition  Arousal/Alertness: Awake/alert ?Behavior During Therapy: [facility] for tasks assessed/performed ?Overall Cognitive Status: Within Functional Limits for tasks assessed ?  ?  ?  ?  ?  ?General Comments: motivated toward continuing therapy and increasing standing, somewhat decreased insight into deficits at times ?  ?  ? ?  ?Exercises Other Exercises ?Other Exercises: STS x5 reps ?Other Exercises: static standing for RLE strengthening (~10-30 seconds at a time) ?Other Exercises: seated BUE AROM: shoulder flexion and lateral reaching x5 reps ?Other Exercises: seated lateral scooting x5 reps for BUE strengthening ? ?  ?General Comments General comments (skin integrity, edema, etc.): HR elevated to 128 bpm with exertion, decreases to ~115 bpm bpm within 1-2 minutes seated break; SpO2 WFL on 2L O2 [place] ?  ?  ? ?Pertinent Vitals/Pain Pain Assessment ?Pain Assessment: Faces ?Faces Pain Scale: Hurts a little bit ?Pain Location: L residual limb ?Pain Descriptors / Indicators: Discomfort, Grimacing ?Pain Intervention(s): Monitored during session, Repositioned  ? ? ?   ?   ? ?PT Goals (current goals can now be found in the care plan section) Acute Rehab PT Goals ?Patient Stated Goal: to get stronger ?PT Goal Formulation: With patient ?Time For Goal Achievement: 06/18/21 ?Progress towards PT goals: Progressing toward goals ? ?  ?Frequency ? ? ? Min 3X/week ? ? ? ?  ?PT Plan Current plan remains appropriate  ? ? ?Co-evaluation PT/OT/SLP Co-Evaluation/Treatment: Yes ?Reason for Co-Treatment: For patient/therapist safety;To address functional/ADL transfers ?PT goals addressed during session: Mobility/safety with mobility;Balance;Proper use of DME;Strengthening/ROM ?OT goals addressed during session: ADL's and self-care ?  ? ?  ?AM-PAC PT "6 Clicks" Mobility   ?Outcome Measure ? Help needed turning from your back to your side while in a flat bed without using bedrails?: None ?Help needed moving from lying on your back to sitting on the side  of a flat bed without using bedrails?: A Little ?Help needed moving to and from a bed to a chair (including a wheelchair)?: A Lot ?Help needed standing up from a chair using your arms (e.g., wheelchair or bedside chair)?: Total ?Help needed to walk in hospital room?: Total ?Help needed climbing 3-5 steps with a railing? : Total ?6 Click Score: 12 ? ?  ?End of Session Equipment Utilized During Treatment: Gait belt;Oxygen ?Activity Tolerance: Patient tolerated treatment well ?Patient left: in chair;with call bell/phone within reach;with chair alarm set ?Nurse Communication: Mobility status;Other (comment);Need for lift equipment (pt does well with SB for return transfer, needed heavy +2 to stand into [place]) ?PT Visit Diagnosis: Unsteadiness on feet (R26.81);Muscle weakness (generalized) (M62.81);History of falling (Z91.81);Difficulty in walking, not elsewhere classified (R26.2);Pain ?Pain - Right/Left: Left ?Pain - part of body: Leg ?  ? ? ?Time: 1093-2355 ?PT Time Calculation (min) (ACUTE ONLY): 27 min ? ?Charges:  $Therapeutic Exercise: 8-22 mins          ?          ? ?Hershell Brandl P., PTA ?Acute Rehabilitation Services ?Secure Chat Preferred 9a-5:30pm ?Office: [phone]  ? ? ?Lando Alcalde M Stephone Gum ?06/11/2021, 1:10 PM ? ?

## 2021-06-11 NOTE — TOC Transition Note (Signed)
Transition of Care (TOC) - CM/SW Discharge Note ? ? ?Patient Details  ?Name: [name] ?MRN: [id number] ?Date of Birth: 31-Jan-1966 ? ?Transition of Care (TOC) CM/SW Contact:  ?Vinie Sill, LCSW ?Phone Number: ?06/11/2021, 1:05 PM ? ? ?Clinical Narrative:    ? ?Patient will Discharge to: [facility] ?Discharge Date: 06/11/2021 ?Family Notified: spouse & son ?Transport By: family ? ?Per MD patient is ready for discharge. RN, patient, and facility notified of discharge. Discharge Summary sent to facility. RN given number for [phone], Room 306-A. Patient will be transported by family. ? ?Clinical Social Worker signing off. ? ?[name], MSW, LCSW ?Clinical Social Worker ? ? ? ?Final next level of care: [place] ?Barriers to Discharge: Barriers Resolved ? ? ?Patient Goals and CMS Choice ?  ?  ?  ? ?Discharge Placement ?  ?           ?Patient chooses bed at: [facility] ?Patient to be transferred to facility by: family ?Name of family member notified: son and sposue ?Patient and family notified of of transfer: 06/11/21 ? ?Discharge Plan and Services ?In-house Referral: Clinical Social Work ?  ?           ?  ?  ?  ?  ?  ?  ?  ?  ?  ?  ? ?Social Determinants of Health (SDOH) Interventions ?  ? ? ?Readmission Risk Interventions ? ?  02/28/2020  ?  1:08 PM  ?Readmission Risk Prevention Plan  ?Transportation Screening Complete  ?PCP or Specialist Appt within 5-7 Days Complete  ?Home Care Screening Complete  ?Medication Review (RN CM) Complete  ? ? ? ? ? ?

## 2021-06-11 NOTE — Progress Notes (Signed)
Called RN Print production planner at [place] and gave report. All questions answered. Payton Emerald, RN ? ?

## 2021-06-11 NOTE — Progress Notes (Signed)
?[facility] ?Progress Note  ? ?Subjective:    ?Seen and examined patient at bedside.  Sleeping on bipap - arouses, denies complaints.  Doesn't have d/c plan yet.  ? ?Objective ?Vitals:  ? 06/10/21 2325 06/11/21 0450 06/11/21 8466 06/11/21 0819  ?BP: 122/77 (!) 128/47  122/74  ?Pulse: 86 98  (!) 108  ?Resp: (!) '24 20  20  '$ ?Temp: 97.7 ?F (36.5 ?C) 98.4 ?F (36.9 ?C)  (!) 97.3 ?F (36.3 ?C)  ?TempSrc: Oral Oral  Oral  ?SpO2: 90% 100%  97%  ?Weight:   115.3 kg   ?Height:      ? ?Physical Exam ?General: chronically ill appearing, NAD ?Heart: RRR, no mrg ?Lungs: CTAB, nml WOB ?Abdomen: soft, NTND ?Extremities: trace to 1+ LE edema, woody appearance. L BKA incision c/d/i ?Dialysis Access: RU AVF +b/t   ? ?Filed Weights  ? 06/10/21 0648 06/10/21 1028 06/11/21 0625  ?Weight: 116 kg 115.1 kg 115.3 kg  ? ? ?Intake/Output Summary (Last 24 hours) at 06/11/2021 0844 ?Last data filed at 06/10/2021 2130 ?Gross per 24 hour  ?Intake 240 ml  ?Output 4000 ml  ?Net -3760 ml  ? ? ? ?Additional Objective ?Labs: ?Basic Metabolic Panel: ?Recent Labs  ?Lab 06/08/21 ?0300 06/09/21 ?0102 06/10/21 ?0134  ?NA 131* 132* 131*  ?K 5.0 4.4 4.5  ?CL 93* 94* 94*  ?CO2 '22 25 24  '$ ?GLUCOSE 135* 189* 145*  ?BUN 52* 34* 48*  ?CREATININE 7.01* 4.96* 6.58*  ?CALCIUM 8.6* 8.6* 8.6*  ?PHOS 4.9* 3.2 3.9  ? ? ?Liver Function Tests: ?Recent Labs  ?Lab 06/07/21 ?0142 06/08/21 ?0300 06/09/21 ?0102 06/10/21 ?0134  ?AST 128*  --  44* 29  ?ALT 66*  --  37 26  ?ALKPHOS 199*  --  191* 179*  ?BILITOT 0.7  --  0.7 0.5  ?PROT 7.4  --  7.7 7.7  ?ALBUMIN 2.5* 2.6* 2.6* 2.7*  ? ? ?No results for input(s): LIPASE, AMYLASE in the last 168 hours. ?CBC: ?Recent Labs  ?Lab 06/06/21 ?5993 06/07/21 ?0142 06/08/21 ?0300 06/09/21 ?0102 06/10/21 ?0134  ?WBC 9.8 9.4 9.7 8.8 8.2  ?NEUTROABS 7.5 6.6  --  6.8 5.8  ?HGB 7.6* 7.4* 7.8* 8.6* 8.4*  ?HCT 24.6* 24.2* 26.2* 27.3* 27.2*  ?MCV 95.0 96.4 98.5 97.5 98.6  ?PLT 260 266 261 247 218  ? ? ?Blood Culture ?   ?Component Value  Date/Time  ? SDES BLOOD BLOOD LEFT FOREARM 06/04/2021 1840  ? SPECREQUEST  06/04/2021 1840  ?  BOTTLES DRAWN AEROBIC ONLY Blood Culture results may not be optimal due to an inadequate volume of blood received in culture bottles  ? CULT  06/04/2021 1840  ?  NO GROWTH 5 DAYS ?Performed at [facility], [place] [address], [place],  [address] ?  ? REPTSTATUS 06/09/2021 FINAL 06/04/2021 1840  ? ? ?Cardiac Enzymes: ?No results for input(s): CKTOTAL, CKMB, CKMBINDEX, TROPONINI in the last 168 hours. ?CBG: ?Recent Labs  ?Lab 06/10/21 ?0604 06/10/21 ?1150 06/10/21 ?1638 06/10/21 ?2109 06/11/21 ?7939  ?GLUCAP 121* 130* 283* 233* 139*  ? ? ?Iron Studies:  ?No results for input(s): IRON, TIBC, TRANSFERRIN, FERRITIN in the last 72 hours. ? ?Lab Results  ?Component Value Date  ? INR 1.0 11/14/2018  ? INR 0.90 01/30/2011  ? ?Studies/Results: ?No results found. ? ?Medications: ? sodium chloride    ? ? (feeding supplement) PROSource Plus  30 mL Oral BID BM  ? sodium chloride   Intravenous Once  ? sodium chloride  Intravenous Once  ? apixaban  5 mg Oral BID  ? Chlorhexidine Gluconate Cloth  6 each Topical Q0600  ? clopidogrel  75 mg Oral Q breakfast  ? docusate sodium  100 mg Oral Daily  ? insulin aspart  0-5 Units Subcutaneous QHS  ? insulin aspart  0-6 Units Subcutaneous TID WC  ? insulin aspart protamine- aspart  28 Units Subcutaneous BID WC  ? metoprolol tartrate  12.5 mg Oral BID  ? pantoprazole  40 mg Oral Daily  ? pramipexole  0.125 mg Oral QHS  ? rosuvastatin  40 mg Oral Daily  ? sodium chloride flush  3 mL Intravenous Q12H  ? sodium chloride flush  3 mL Intravenous Q12H  ? ? ?Dialysis Orders: ?MWF  - GOC ?4hrs, BFR 450, DFR 800,  EDW 110.6kg, 2K/ 2.5Ca, RU AVF,  ?-Heparin 10000 unit IV bolus TIW ? - No ESA 2/2 ?malignancy (no diagnosis - has stable renal lesion, pulm lesions -> saw onc 4/17, plan is for bronchoscopy with Bx of lung nodules in near future) ?- Calcitriol 2.5 mcg PO qHD ?- Parsabiv 7.5 mg IV qHD   ? 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  S/p balloon angioplasty and laser atherectomy to LATA 4/26, then amputation of L 4th/5th metarsals 05/28/2021 Dr. Donzetta Matters. 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  ?Afib with RVR- new onset 5/6 after HD. Remains in SR. Continue Metoprolol.  Now on eliquis.  ?Volume overload:  Continue lowering volume as tolerated with HD. Well over EDW but bed weight.  After BKA will need EDW adjusted outpt. Tolerated 4L with HD yesterday.  ?ESRD: Continue HD per usual MWF schedule.  ?Hypertension: BP variable - normal this AM pre HD. On metoprolol for rate control. Has had hypotension with HD, can utilizing midodrine with HD for BP support.   ?Anemia of ESRD: Hgb 7 on admit, s/p 5 units PRBCs total during hospitalization so far. Hb 8.4.  Follow trends. Not getting outpatient ESA d/t potential malignancy. Transfusion for Hgb <7.  ?Secondary Hyperparathyroidism: Ca/Phos ok. Continue VDRA on MWF.  Unable to give parsabiv not on formulary.  Continue binders. ?Nutrition - Renal diet w/fluid restrictions.  ?DMT2 - per PMD ? ?Jannifer Hick MD ?[facility] ?Pager [phone] ? ?  ?

## 2021-06-11 NOTE — Discharge Summary (Signed)
She ? ?Physician Discharge Summary  ?[name] NTI:[id number] DOB: 1965-12-22 DOA: 05/25/2021 ? ?PCP: Sandi Mariscal, MD ? ?Admit date: 05/25/2021 ?Discharge date: 06/11/2021 ? ?Admitted From: home ?Disposition:  SNF ? ?Recommendations for Outpatient Follow-up:  ?Follow up with PCP in 1-2 weeks ?Continue HD as scheduled ? ?Home Health: none ?Equipment/Devices: none ? ?Discharge Condition: stable ?CODE STATUS: Full code ?Diet Orders (From admission, onward)  ? ?  Start     Ordered  ? 06/09/21 8676  Diet regular Room service appropriate? Yes with Assist; Fluid consistency: Thin; Fluid restriction: 1500 mL Fluid  Diet effective now       ?Question Answer Comment  ?Room service appropriate? Yes with Assist   ?Fluid consistency: Thin   ?Fluid restriction: 1500 mL Fluid   ?  ? 06/09/21 0951  ? ?  ?  ? ?  ? ? ?HPI: Per admitting MD, ?[name] is a 57 y.o. female with medical history significant of hypertension, diabetes, hyperlipidemia, CHF, ESRD on HD, chronic pain, CAD, anemia, Buerger disease, rheumatoid arthritis presenting with concern for infected left fifth toe. Patient presenting with concern for possible infection of left fifth toe.  Patient reports 3 days of bleeding noted at the toe.  Also noted a foul order starting yesterday.  She does not report any pain that she has significant neuropathy. She denies fevers, chills, chest pain, shortness of breath, abdominal pain, constipation, diarrhea, nausea, vomiting. Of note, she states she completed her last dialysis session today but states they were plan to do an additional session tomorrow as she was volume overloaded but they stopped being able to get a good blood pressure so they took her off machine. ? ?Hospital Course / Discharge diagnoses: ?Principal Problem: ?  Osteomyelitis ([place]) ?Active Problems: ?  Shortness of breath ?  Acute metabolic encephalopathy ?  Bacteremia due to Enterobacter aerogenes ?  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  Anemia in chronic kidney disease ?  Hypotension ?  Atrial fibrillation with RVR ([place]) ?  Asterixis ?  Hypertension associated with diabetes ([place]) ?  Hyperlipidemia associated with type 2 diabetes mellitus ([place]) ?  Rheumatoid arthritis ([place]) ?  ESRD (end stage renal disease) ([place]) ?  Type 2 diabetes mellitus with diabetic chronic kidney disease ([place]) ?  Itching ?  Diastolic dysfunction ?  Chronic pain disorder ?  Coronary artery disease ?  Obesity (BMI 30-39.9) ?  Sepsis ([place]) ? ? 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  She also underwent left fourth and fifth toes amputation on 4/27.  1/2 sets with Enterobacter aerogenes from aerobic bottle only, and 1/2 sets with Staph epidermidis (aerobic and anaerobic)-thought to be contaminant.  Repeat cultures 4/28 and 4/29 no growth.  She has been maintained on cefepime for 9 days, felt like he was adequate coverage postoperatively and cefepime was discontinued 5/9 due to concern for contributing to her encephalopathy. ?  ?Active problems ?Acute metabolic encephalopathy -postoperatively, possibly related to opiates, s/p narcan, she was also given a trial of BiPAP, mental status seems to be improving.  There is no clear-cut evidence of hypercarbia.  In addition, she was on cefepime for bacteremia as below and this was discontinued.  Underwent an EEG which showed moderate diffuse encephalopathy  ?Shortness of breath -CXR with evidence of edema, possible L effusion.  She also possibly has obesity  hypoventilation/OSA. volume management per nephrology.  She was started on BiPAP at nighttime while here, mental status is better, will need follow-up with PFTs and sleep study as an outpatient ?Left foot wound with unreconstructable vascular disease -Critical limb ischemia of LLE ?S/p US guided micropuncture acess  of R common femoral artery, aortogram, second order cannulation LLE angiogram, laser atherectomy of anterior tibial artery, balloon angioplasty of anterior tibial artery, devise assisted closure - mynx on 4/26. 4/27 amputation of L 4th and 5th toes including metatarsal bones and sharp excisional debridement L foot of skin and soft tissue measuring 12x7 cm. Now s/p L BKA 5/2 -> stump viable, suture line intact, healthy ?Appreciate vascular recs.  Currently on Plavix ?Atrial fibrillation with RVR ([facility]) -Improved, rate now controlled, TSH wnl.  Echo with Ef 60-65%, IV septum flattened in systole, c/w RV pressure overload, RVSF mildly reduced, severely elevated PASP -> increased RVSP from prior study, IV septum flattened in systole noted on 11/2020 echo.  Continue metoprolol, Eliquis ?Hypotension -Improved (5/5 needed bolus and midodrine), will follow, Midodrine, albumin during dialysis ?Anemia in chronic kidney disease, postop acute blood loss anemia-S/p 5 unit pRBC.  Hemoglobin now stable ?Asterixis -Initially suspected related to opiates in renal dz, but she's been off these several days now, Suspect OSA/OHS as above, but no hypercarbia. Seems better after BiPAP ?Hypertension associated with diabetes ([place]) ?Hyperlipidemia associated with type 2 diabetes mellitus (HCC) -rosuvastatin ?Rheumatoid arthritis ([place]) -humira  ?ESRD (end stage renal disease) ([place]) -Renal consulted ?Type 2 diabetes mellitus with diabetic chronic kidney disease ([facility]) -SSI, 70/30 ? ?Sepsis ruled out ? ? ?Discharge Instructions ? ? ?Allergies as of 06/11/2021   ? ?   Reactions  ? Bupropion Itching  ? Dilaudid [hydromorphone] Other (See Comments)  ? Apnea, required intubation  ? ?  ? ?  ?Medication List  ?  ? ?STOP taking these medications   ? ?metoprolol succinate 100 MG 24 hr tablet ?Commonly known as: TOPROL-XL ?  ? ?  ? ?TAKE these medications   ? ?acetaminophen 500 MG tablet ?Commonly known as: TYLENOL ?Take 500-1,000 mg by mouth daily as  needed for headache (pain). ?  ?albuterol 108 (90 Base) MCG/ACT inhaler ?Commonly known as: VENTOLIN HFA ?Inhale 2 puffs into the lungs every 6 (six) hours as needed for wheezing or shortness of breath. ?  ?apixaban 5 MG Tabs tablet ?Commonly known as: ELIQUIS ?Take 1 tablet (5 mg total) by mouth 2 (two) times daily. ?  ?Auryxia 1 GM 210 MG(Fe) tablet ?Generic drug: ferric citrate ?Take 420-840 mg by mouth See admin instructions. 840 mg twice daily with meals, occasionally take 420 mg with a snack ?  ?b complex-vitamin c-folic acid 0.8 MG Tabs tablet ?Take 1 tablet by mouth every Monday, Wednesday, and Friday with hemodialysis. ?  ?clopidogrel 75 MG tablet ?Commonly known as: PLAVIX ?Take 1 tablet (75 mg total) by mouth daily with breakfast. ?  ?Humira Pen 40 MG/0.4ML Pnkt ?Generic drug: Adalimumab ?Inject 40 mg into the skin every 14 (fourteen) days. ?  ?hydrOXYzine 25 MG tablet ?Commonly known as: ATARAX ?Take 25 mg by mouth every morning. ?  ?Insulin Lispro Prot & Lispro (75-25) 100 UNIT/ML Kwikpen ?Commonly known as: HUMALOG 75/25 MIX ?Inject 35 Units into the skin 2 (two) times daily with a meal. ?What changed: when to take this ?  ?metoprolol tartrate 25 MG tablet ?Commonly known as: LOPRESSOR ?Take 0.5 tablets (12.5 mg total) by mouth 2 (two) times daily. ?  ?midodrine 10 MG  tablet ?Commonly known as: PROAMATINE ?Take 1 tablet (10 mg total) by mouth as needed (as needed for hypotension in dialysis). ?  ?MIRCERA IJ ?Dialysis Monday,Wednesday and friday ?  ?Narcan 4 MG/0.1ML Liqd nasal spray kit ?Generic drug: naloxone ?Place 1 spray into the nose as needed (accidental overdose.). ?  ?OneTouch Verio test strip ?Generic drug: glucose blood ?1 each by Other route 2 (two) times daily. And lancets 2/day ?  ?OVER THE COUNTER MEDICATION ?Take 1 capsule by mouth every morning. Omega XL ?  ?oxyCODONE-acetaminophen 10-325 MG tablet ?Commonly known as: PERCOCET ?Take 1 tablet by mouth 3 (three) times daily as needed for  pain. ?  ?pramipexole 0.5 MG tablet ?Commonly known as: MIRAPEX ?Take 0.5 mg by mouth daily. ?  ?rosuvastatin 40 MG tablet ?Commonly known as: CRESTOR ?TAKE 1 TABLET BY MOUTH DAILY *PATIENT NEEDS APPOINTMENT* ?

## 2021-06-11 NOTE — Care Management Important Message (Signed)
Important Message ? ?Patient Details  ?Name: [name] ?MRN: [id number] ?Date of Birth: 1965-04-15 ? ? ?Medicare Important Message Given:  Yes ? ? ? ? ?Shelda Altes ?06/11/2021, 10:17 AM ?

## 2021-06-11 NOTE — Progress Notes (Signed)
Occupational Therapy Treatment ?Patient Details ?Name: [name] ?MRN: [id number] ?DOB: [birth year] ?Today's Date: 06/11/2021 ? ? ?History of present illness 56 y.o. female presented 05/25/21 with infected L 5th toe. S/p L leg aortogram, angiogram, laser atherectomy of the anterior tibial artery, and balloon angioplasty of anterior tibial artery 4/26. 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 PMH: hypertension, diabetes, hyperlipidemia, CHF, ESRD on HD, chronic pain, CAD, anemia, Buerger disease, rheumatoid arthritis ?  ?OT comments ? Patient seated on EOB and had completed dressing. Patient stated she stood with son and staff to pull up pants. Patient stood x4 from EOB with RW and max assist x2. Patient stood into stedy for transfer to recliner with max assist +2 to stand and was mod assist +2 to lower into chair. Patient expected to discharge to SNF for continued therapy services.   ? ?Recommendations for follow up therapy are one component of a multi-disciplinary discharge planning process, led by the attending physician.  Recommendations may be updated based on patient status, additional functional criteria and insurance authorization. ?   ?Follow Up Recommendations ? Skilled nursing-short term rehab (<3 hours/day)  ?  ?Assistance Recommended at Discharge Frequent or constant Supervision/Assistance  ?Patient can return home with the following ? A lot of help with bathing/dressing/bathroom;Assistance with cooking/housework;Help with stairs or ramp for entrance;Assist for transportation;A lot of help with walking and/or transfers ?  ?Equipment Recommendations ? None recommended by OT  ?  ?Recommendations for Other Services   ? ?  ?Precautions / Restrictions Precautions ?Precautions: Fall ?Precaution Comments: new L BKA 06/02/21 ?Required Braces or Orthoses: Other Brace ?Other Brace: limb guard ?Restrictions ?Weight Bearing Restrictions: Yes ?LLE Weight Bearing: Non weight bearing  ? ? ?   ? ?Mobility Bed Mobility ?Overal bed mobility: Needs Assistance ?  ?  ?  ?  ?  ?  ?General bed mobility comments: seated on EOB upon entry ?  ? ?Transfers ?Overall transfer level: Needs assistance ?Equipment used: Rolling walker (2 wheels), Ambulation equipment used ?Transfers: Sit to/from Stand, Bed to chair/wheelchair/BSC ?Sit to Stand: +2 physical assistance, From elevated surface, Max assist ?  ?  ?  ?  ?  ?General transfer comment: Patient performed 4 stands from EOB to RW and used Stedy to transfer to recliner ?Transfer via Lift Equipment: Stedy ?  ?Balance Overall balance assessment: Needs assistance ?Sitting-balance support: Bilateral upper extremity supported, Feet supported, No upper extremity supported ?Sitting balance-Leahy Scale: Fair ?Sitting balance - Comments: seated on EOB upon entry ?  ?Standing balance support: Bilateral upper extremity supported ?Standing balance-Leahy Scale: Poor ?Standing balance comment: Stood from EOB x4 to RW with max assist x2 and stood a fifth time in stedy for transfer to recliner ?  ?  ?  ?  ?  ?  ?  ?  ?  ?  ?  ?   ? ?ADL either performed or assessed with clinical judgement  ? ?ADL Overall ADL's : Needs assistance/impaired ?  ?  ?Grooming: Wash/dry hands;Wash/dry face;Supervision/safety;Sitting ?Grooming Details (indicate cue type and reason): EOB ?  ?  ?  ?  ?  ?  ?  ?  ?  ?  ?  ?  ?  ?  ?  ?General ADL Comments: Patient dressed upon entry ?  ? ?Extremity/Trunk Assessment   ?  ?  ?  ?  ?  ? ?Vision   ?  ?  ?Perception   ?  ?Praxis   ?  ? ?  Cognition Arousal/Alertness: Awake/alert ?Behavior During Therapy: [facility] for tasks assessed/performed ?Overall Cognitive Status: Within Functional Limits for tasks assessed ?  ?  ?  ?  ?  ?  ?  ?  ?  ?  ?  ?  ?  ?  ?  ?  ?General Comments: motivated toward continuing therapy and increasing standing ?  ?  ?   ?Exercises   ? ?  ?Shoulder Instructions   ? ? ?  ?General Comments    ? ? ?Pertinent Vitals/ Pain       Pain Assessment ?Pain  Assessment: Faces ?Faces Pain Scale: Hurts a little bit ?Pain Location: L residual limb ?Pain Descriptors / Indicators: Discomfort, Grimacing ?Pain Intervention(s): Monitored during session ? ?Home Living   ?  ?  ?  ?  ?  ?  ?  ?  ?  ?  ?  ?  ?  ?  ?  ?  ?  ?  ? ?  ?Prior Functioning/Environment    ?  ?  ?  ?   ? ?Frequency ? Min 2X/week  ? ? ? ? ?  ?Progress Toward Goals ? ?OT Goals(current goals can now be found in the care plan section) ? Progress towards OT goals: Progressing toward goals ? ?Acute Rehab OT Goals ?Patient Stated Goal: get better ?OT Goal Formulation: With patient ?Time For Goal Achievement: 06/18/21 ?Potential to Achieve Goals: Good ?ADL Goals ?Pt Will Perform Grooming: with modified independence;sitting ?Pt Will Perform Upper Body Dressing: with modified independence;sitting ?Pt Will Perform Lower Body Dressing: with min assist;sitting/lateral leans ?Pt Will Transfer to Toilet: stand pivot transfer;bedside commode;with mod assist  ?Plan Discharge plan remains appropriate   ? ?Co-evaluation ? ? ? PT/OT/SLP Co-Evaluation/Treatment: Yes ?Reason for Co-Treatment: For patient/therapist safety;To address functional/ADL transfers ?  ?OT goals addressed during session: ADL's and self-care ?  ? ?  ?AM-PAC OT "6 Clicks" Daily Activity     ?Outcome Measure ? ? Help from another person eating meals?: None ?Help from another person taking care of personal grooming?: A Little ?Help from another person toileting, which includes using toliet, bedpan, or urinal?: A Lot ?Help from another person bathing (including washing, rinsing, drying)?: A Lot ?Help from another person to put on and taking off regular upper body clothing?: A Lot ?Help from another person to put on and taking off regular lower body clothing?: A Lot ?6 Click Score: 15 ? ?  ?End of Session Equipment Utilized During Treatment: Gait belt;Rolling walker (2 wheels);Oxygen;Other (comment) Charlaine Dalton) ? ?OT Visit Diagnosis: Unsteadiness on feet  (R26.81);Muscle weakness (generalized) (M62.81);Pain ?Pain - Right/Left: Left ?Pain - part of body: Leg ?  ?Activity Tolerance Patient tolerated treatment well ?  ?Patient Left in chair;with call bell/phone within reach;with family/visitor present ?  ?Nurse Communication Mobility status ?  ? ?   ? ?Time: 5027-7412 ?OT Time Calculation (min): 26 min ? ?Charges: OT General Charges ?$OT Visit: 1 Visit ?OT Treatments ?$Therapeutic Activity: 8-22 mins ? ?Lodema Hong, OTA ?Acute Rehabilitation Services  ?Pager [phone] ?Office [phone] ? ? ?[place] ?06/11/2021, 12:13 PM ?

## 2021-06-12 ENCOUNTER — Non-Acute Institutional Stay (SKILLED_NURSING_FACILITY): Payer: Medicare Other | Admitting: Internal Medicine

## 2021-06-12 ENCOUNTER — Encounter: Payer: Self-pay | Admitting: Internal Medicine

## 2021-06-12 DIAGNOSIS — I96 Gangrene, not elsewhere classified: Secondary | ICD-10-CM

## 2021-06-12 DIAGNOSIS — D631 Anemia in chronic kidney disease: Secondary | ICD-10-CM

## 2021-06-12 DIAGNOSIS — E1122 Type 2 diabetes mellitus with diabetic chronic kidney disease: Secondary | ICD-10-CM

## 2021-06-12 DIAGNOSIS — Z992 Dependence on renal dialysis: Secondary | ICD-10-CM

## 2021-06-12 DIAGNOSIS — N186 End stage renal disease: Secondary | ICD-10-CM | POA: Diagnosis not present

## 2021-06-12 DIAGNOSIS — Z794 Long term (current) use of insulin: Secondary | ICD-10-CM

## 2021-06-12 DIAGNOSIS — G4733 Obstructive sleep apnea (adult) (pediatric): Secondary | ICD-10-CM

## 2021-06-12 NOTE — Assessment & Plan Note (Signed)
4/24 - 06/11/2021 required 5 units of packed red cells perioperatively for ray amputation and subsequent left BKA. ?Nadir H/H 6.4/20.1 final H/H 8.4/27.2.  Monitor for any bleeding dyscrasias at the SNF. ?

## 2021-06-12 NOTE — Patient Instructions (Signed)
See assessment and plan under each diagnosis in the problem list and acutely for this visit 

## 2021-06-12 NOTE — Assessment & Plan Note (Signed)
Risk of sleep apnea of stroke discussed with her.  Outpatient formal evaluation for sleep apnea to be scheduled by PCP. ?

## 2021-06-12 NOTE — Assessment & Plan Note (Signed)
Wound care nurse will monitor at SNF 

## 2021-06-12 NOTE — Progress Notes (Signed)
? ?NURSING HOME LOCATION:  [facility] ?ROOM NUMBER:  [id number] ? ?CODE STATUS:  Full Code ? ?PCP:  Sandi Mariscal MD ? ?This is a comprehensive admission note to this SNFperformed on this date less than 30 days from date of admission. ?Included are preadmission medical/surgical history; reconciled medication list; family history; social history and comprehensive review of systems.  ?Corrections and additions to the records were documented. Comprehensive physical exam was also performed. Additionally a clinical summary was entered for each active diagnosis pertinent to this admission in the Problem List to enhance continuity of care. ? ?HPI: She was hospitalized 4/24 - 06/11/2021 with 3 days of bleeding from the left fifth toe associated with a foul odor beginning 4/23.  There was no associated pain as she has significant peripheral neuropathy. ?The patient had hemodialysis the day of admission and additional dialysis session was planned for 4/25 as she was clinically volume overloaded.  They had to interrupt the 4/24 dialysis because of soft blood pressures. ?Vascular surgery consulted . Critical limb ischemia of the LLE was present.  On 4/26 ultrasound-guided micropuncture access of right common femoral artery, aortogram, second-order cannulation, LLE angiogram, laser atherectomy of anterior tibial artery, balloon angioplasty of anterior tibial artery were performed.  The left fourth and fifth toes were amputated on 4/27.  Metatarsal bones with sharp excisional debridement of the left foot of the skin and soft tissues measuring 12 x 7 cm. ?Vascular surgery deemed the peripheral vascular disease to be unreconstructable and performed a BKA on 5/2.  The stump was viable with healthy suture line.Marland Kitchen   ?Admission white count was 17,800 and lactic acid level was 2.4.  Cultures at that time revealed Enterococcus from aerobic bottle only.  Cefepime was initiated and given for total 9 days. ?Postop course was  complicated by acute metabolic encephalopathy attributed to opiates & probable OSA.  She received Narcan and BiPAP was initiated.  EEG revealed moderate diffuse encephalopathy. ?Dyspnea was attributed to some interstitial edema and possible left effusion on imaging.  This is in the context of probable obesity hypoventilation/OSA.  Nephrology managed her volume status.  BiPAP at night was initiated with improvement in mental status. ?A-fib with RVR was treated with resulting good rate control.  PASP was severely elevated. ?Intermittent hypotension necessitated fluid boli and midodrine.  She also received albumin during dialysis. ?She had progressive anemia.  Admission H/H was 7.2/23.1.  Nadir H/H was 6.4/20.1.  She received 5 units of packed cells during hospitalization.  Final H/H was 8.4/27.2 with normochromic, normocytic indices. ?She also demonstrated asterixis although she had been off the opioids for several days.  This did improve with BiPAP. ?Hospitalized glucoses ranged from a low of 92 up to high of 391 at admission.  Her current A1c will was 11.3% on 04/26/2021. ?She was felt to be clinically stable enough for discharge to the SNF for rehab. ? ?Past medical and surgical history: Includes past history of anaphylactoid shock, ESRD on hemodialysis with chronic anemia, CAD, history of congestive heart failure, dyslipidemia, essential hypertension, uncontrolled diabetes with peripheral neuropathy and PVD, probable OSA, and Buerger's disease. ?Surgery &  procedures include amputations of thumb and fingers of the left hand, AV fistula placement on multiple occasions for ESRD, and upper extremity venography. ? ?Social history: Nondrinker; former smoker. ? ?Family history: Extensive history reviewed; strong history of hypertension, diabetes, and stroke. ?  ?Review of systems: States that she was evaluated for obstructive sleep apnea few months  ago but could not complete the study as she could not go to sleep.  She  realizes she did receive CPAP in the hospital.  She states she has been snoring for 20 years and was first told of apnea in 2013.   ?She is not checking her sugars at home as "my machine went caput".  She has anuria with the ESRD. She has chronic back pain.  She describes frequent loose stools.  She also has paroxysmal nocturnal dyspnea intermittently. ? ?Constitutional: No fever, significant weight change ?Eyes: No redness, discharge, pain, vision change ?ENT/mouth: No nasal congestion, purulent discharge, earache, change in hearing, sore throat  ?Cardiovascular: No chest pain, palpitations, edema  ?Respiratory: No cough, sputum production, hemoptysis ?Gastrointestinal: No heartburn, dysphagia, abdominal pain, nausea /vomiting, rectal bleeding, melena ?Musculoskeletal: No joint stiffness, joint swelling ?Dermatologic: No rash, pruritus, change in appearance of skin ?Neurologic: No dizziness, headache, syncope, seizures, numbness, tingling ?Psychiatric: No significant anxiety, depression, insomnia, anorexia ?Endocrine: No change in hair/skin/nails, excessive thirst, excessive hunger, excessive urination  ?Hematologic/lymphatic: No significant bruising, lymphadenopathy, abnormal bleeding ?Allergy/immunology: No itchy/watery eyes, significant sneezing, urticaria, angioedema ? ?Physical exam:  ?Pertinent or positive findings: She is morbidly obese.  First and second heart sounds are increased and the second heart sound is split.  Rhythm is slightly irregular.  She has some minor rhonchi bilaterally.  Abdomen is protuberant.  BKA is present on the left.  There is 1/2+ edema at the right sock line.  Scarring is present at the ankle.  Pedal pulses are decreased.  She is missing the left thumb and index finger.  There is some scarring in an irregular distribution over the left hand. ? ?General appearance: no acute distress, increased work of breathing is present.   ?Lymphatic: No lymphadenopathy about the head, neck,  axilla. ?Eyes: No conjunctival inflammation or lid edema is present. There is no scleral icterus. ?Ears:  External ear exam shows no significant lesions or deformities.   ?Nose:  External nasal examination shows no deformity or inflammation. Nasal mucosa are pink and moist without lesions, exudates ?Oral exam: Lips and gums are healthy appearing.There is no oropharyngeal erythema or exudate. ?Neck:  No thyromegaly, masses, tenderness noted.    ?Heart:  No gallop, murmur, click, rub.  ?Lungs:  without wheezes,  rales, rubs. ?Abdomen: Bowel sounds are normal.  Abdomen is soft and nontender with no organomegaly, hernias, masses. ?GU: Deferred  ?Extremities:  No cyanosis, clubbing. ?Neurologic exam:  Balance, Rhomberg, finger to nose testing could not be completed due to clinical state ?Skin: Warm & dry w/o tenting. ? ?See clinical summary under each active problem in the Problem List with associated updated therapeutic plan ? ?

## 2021-06-12 NOTE — Assessment & Plan Note (Addendum)
Current A1c is 11.3%.  The associated risk of almost 90% of increased risk of heart attack or stroke was discussed with her.  Dietary interventions with avoidance of high fructose corn syrup sugar was discussed.  I gave her a diagram on calculating grams of sugar intake from HFCS. ?

## 2021-06-15 ENCOUNTER — Inpatient Hospital Stay (HOSPITAL_COMMUNITY): Payer: Medicare Other

## 2021-06-15 ENCOUNTER — Other Ambulatory Visit: Payer: Self-pay

## 2021-06-15 ENCOUNTER — Emergency Department (HOSPITAL_COMMUNITY): Payer: Medicare Other

## 2021-06-15 ENCOUNTER — Inpatient Hospital Stay (HOSPITAL_COMMUNITY)
Admission: EM | Admit: 2021-06-15 | Discharge: 2021-06-18 | DRG: 917 | Disposition: A | Discharge status: Skilled Nursing Facility | Payer: Medicare Other | Attending: Internal Medicine | Admitting: Internal Medicine

## 2021-06-15 ENCOUNTER — Encounter (HOSPITAL_COMMUNITY): Payer: Self-pay | Admitting: *Deleted

## 2021-06-15 DIAGNOSIS — T402X1A Poisoning by other opioids, accidental (unintentional), initial encounter: Secondary | ICD-10-CM | POA: Diagnosis present

## 2021-06-15 DIAGNOSIS — I959 Hypotension, unspecified: Secondary | ICD-10-CM | POA: Diagnosis present

## 2021-06-15 DIAGNOSIS — G2581 Restless legs syndrome: Secondary | ICD-10-CM | POA: Diagnosis present

## 2021-06-15 DIAGNOSIS — I4891 Unspecified atrial fibrillation: Secondary | ICD-10-CM

## 2021-06-15 DIAGNOSIS — Z794 Long term (current) use of insulin: Secondary | ICD-10-CM | POA: Diagnosis not present

## 2021-06-15 DIAGNOSIS — I482 Chronic atrial fibrillation, unspecified: Secondary | ICD-10-CM | POA: Diagnosis present

## 2021-06-15 DIAGNOSIS — N186 End stage renal disease: Secondary | ICD-10-CM | POA: Diagnosis present

## 2021-06-15 DIAGNOSIS — E7800 Pure hypercholesterolemia, unspecified: Secondary | ICD-10-CM | POA: Diagnosis present

## 2021-06-15 DIAGNOSIS — I132 Hypertensive heart and chronic kidney disease with heart failure and with stage 5 chronic kidney disease, or end stage renal disease: Secondary | ICD-10-CM | POA: Diagnosis present

## 2021-06-15 DIAGNOSIS — I872 Venous insufficiency (chronic) (peripheral): Secondary | ICD-10-CM | POA: Diagnosis present

## 2021-06-15 DIAGNOSIS — R34 Anuria and oliguria: Secondary | ICD-10-CM | POA: Diagnosis present

## 2021-06-15 DIAGNOSIS — Z833 Family history of diabetes mellitus: Secondary | ICD-10-CM

## 2021-06-15 DIAGNOSIS — E875 Hyperkalemia: Secondary | ICD-10-CM | POA: Diagnosis present

## 2021-06-15 DIAGNOSIS — Z7901 Long term (current) use of anticoagulants: Secondary | ICD-10-CM

## 2021-06-15 DIAGNOSIS — E1122 Type 2 diabetes mellitus with diabetic chronic kidney disease: Secondary | ICD-10-CM | POA: Diagnosis present

## 2021-06-15 DIAGNOSIS — E1165 Type 2 diabetes mellitus with hyperglycemia: Secondary | ICD-10-CM | POA: Diagnosis present

## 2021-06-15 DIAGNOSIS — E871 Hypo-osmolality and hyponatremia: Secondary | ICD-10-CM | POA: Diagnosis present

## 2021-06-15 DIAGNOSIS — Z8249 Family history of ischemic heart disease and other diseases of the circulatory system: Secondary | ICD-10-CM

## 2021-06-15 DIAGNOSIS — I739 Peripheral vascular disease, unspecified: Secondary | ICD-10-CM | POA: Diagnosis present

## 2021-06-15 DIAGNOSIS — G4733 Obstructive sleep apnea (adult) (pediatric): Secondary | ICD-10-CM | POA: Diagnosis present

## 2021-06-15 DIAGNOSIS — I6200 Nontraumatic subdural hemorrhage, unspecified: Secondary | ICD-10-CM | POA: Diagnosis present

## 2021-06-15 DIAGNOSIS — Z992 Dependence on renal dialysis: Secondary | ICD-10-CM

## 2021-06-15 DIAGNOSIS — Z89022 Acquired absence of left finger(s): Secondary | ICD-10-CM

## 2021-06-15 DIAGNOSIS — D631 Anemia in chronic kidney disease: Secondary | ICD-10-CM | POA: Diagnosis present

## 2021-06-15 DIAGNOSIS — Z87892 Personal history of anaphylaxis: Secondary | ICD-10-CM

## 2021-06-15 DIAGNOSIS — I5032 Chronic diastolic (congestive) heart failure: Secondary | ICD-10-CM | POA: Diagnosis present

## 2021-06-15 DIAGNOSIS — Z823 Family history of stroke: Secondary | ICD-10-CM

## 2021-06-15 DIAGNOSIS — Z888 Allergy status to other drugs, medicaments and biological substances status: Secondary | ICD-10-CM

## 2021-06-15 DIAGNOSIS — Z89012 Acquired absence of left thumb: Secondary | ICD-10-CM

## 2021-06-15 DIAGNOSIS — I5189 Other ill-defined heart diseases: Secondary | ICD-10-CM

## 2021-06-15 DIAGNOSIS — L97919 Non-pressure chronic ulcer of unspecified part of right lower leg with unspecified severity: Secondary | ICD-10-CM | POA: Diagnosis present

## 2021-06-15 DIAGNOSIS — F419 Anxiety disorder, unspecified: Secondary | ICD-10-CM | POA: Diagnosis present

## 2021-06-15 DIAGNOSIS — I2510 Atherosclerotic heart disease of native coronary artery without angina pectoris: Secondary | ICD-10-CM | POA: Diagnosis present

## 2021-06-15 DIAGNOSIS — M069 Rheumatoid arthritis, unspecified: Secondary | ICD-10-CM | POA: Diagnosis present

## 2021-06-15 DIAGNOSIS — Z89512 Acquired absence of left leg below knee: Secondary | ICD-10-CM

## 2021-06-15 DIAGNOSIS — Z8616 Personal history of COVID-19: Secondary | ICD-10-CM

## 2021-06-15 DIAGNOSIS — Z6841 Body Mass Index (BMI) 40.0 and over, adult: Secondary | ICD-10-CM

## 2021-06-15 DIAGNOSIS — I731 Thromboangiitis obliterans [Buerger's disease]: Secondary | ICD-10-CM | POA: Diagnosis present

## 2021-06-15 DIAGNOSIS — F17210 Nicotine dependence, cigarettes, uncomplicated: Secondary | ICD-10-CM | POA: Diagnosis present

## 2021-06-15 DIAGNOSIS — I953 Hypotension of hemodialysis: Secondary | ICD-10-CM | POA: Diagnosis present

## 2021-06-15 DIAGNOSIS — Z809 Family history of malignant neoplasm, unspecified: Secondary | ICD-10-CM

## 2021-06-15 DIAGNOSIS — E1169 Type 2 diabetes mellitus with other specified complication: Secondary | ICD-10-CM | POA: Diagnosis present

## 2021-06-15 DIAGNOSIS — G9340 Encephalopathy, unspecified: Secondary | ICD-10-CM | POA: Diagnosis present

## 2021-06-15 DIAGNOSIS — T40601A Poisoning by unspecified narcotics, accidental (unintentional), initial encounter: Secondary | ICD-10-CM | POA: Insufficient documentation

## 2021-06-15 DIAGNOSIS — Z83438 Family history of other disorder of lipoprotein metabolism and other lipidemia: Secondary | ICD-10-CM

## 2021-06-15 DIAGNOSIS — Z9842 Cataract extraction status, left eye: Secondary | ICD-10-CM

## 2021-06-15 DIAGNOSIS — Z818 Family history of other mental and behavioral disorders: Secondary | ICD-10-CM

## 2021-06-15 DIAGNOSIS — I878 Other specified disorders of veins: Secondary | ICD-10-CM | POA: Diagnosis present

## 2021-06-15 DIAGNOSIS — Z9841 Cataract extraction status, right eye: Secondary | ICD-10-CM

## 2021-06-15 DIAGNOSIS — Z89422 Acquired absence of other left toe(s): Secondary | ICD-10-CM

## 2021-06-15 LAB — BLOOD GAS, VENOUS
Acid-Base Excess: 0.9 mmol/L (ref 0.0–2.0)
Bicarbonate: 28.5 mmol/L — ABNORMAL HIGH (ref 20.0–28.0)
Drawn by: 56089
O2 Saturation: 58.8 %
Patient temperature: 36.5
pCO2, Ven: 61 mmHg — ABNORMAL HIGH (ref 44–60)
pH, Ven: 7.28 (ref 7.25–7.43)
pO2, Ven: 38 mmHg (ref 32–45)

## 2021-06-15 LAB — CBC WITH DIFFERENTIAL/PLATELET
Abs Immature Granulocytes: 0.03 10*3/uL (ref 0.00–0.07)
Basophils Absolute: 0.1 10*3/uL (ref 0.0–0.1)
Basophils Relative: 1 %
Eosinophils Absolute: 0.4 10*3/uL (ref 0.0–0.5)
Eosinophils Relative: 6 %
HCT: 28.8 % — ABNORMAL LOW (ref 36.0–46.0)
Hemoglobin: 9 g/dL — ABNORMAL LOW (ref 12.0–15.0)
Immature Granulocytes: 0 %
Lymphocytes Relative: 17 %
Lymphs Abs: 1.2 10*3/uL (ref 0.7–4.0)
MCH: 30.6 pg (ref 26.0–34.0)
MCHC: 31.3 g/dL (ref 30.0–36.0)
MCV: 98 fL (ref 80.0–100.0)
Monocytes Absolute: 0.5 10*3/uL (ref 0.1–1.0)
Monocytes Relative: 8 %
Neutro Abs: 4.8 10*3/uL (ref 1.7–7.7)
Neutrophils Relative %: 68 %
Platelets: 203 10*3/uL (ref 150–400)
RBC: 2.94 MIL/uL — ABNORMAL LOW (ref 3.87–5.11)
RDW: 16.8 % — ABNORMAL HIGH (ref 11.5–15.5)
WBC: 7 10*3/uL (ref 4.0–10.5)
nRBC: 0.4 % — ABNORMAL HIGH (ref 0.0–0.2)

## 2021-06-15 LAB — GLUCOSE, CAPILLARY
Glucose-Capillary: 84 mg/dL (ref 70–99)
Glucose-Capillary: 87 mg/dL (ref 70–99)
Glucose-Capillary: 101 mg/dL — ABNORMAL HIGH (ref 70–99)
Glucose-Capillary: 118 mg/dL — ABNORMAL HIGH (ref 70–99)
Glucose-Capillary: 204 mg/dL — ABNORMAL HIGH (ref 70–99)

## 2021-06-15 LAB — I-STAT VENOUS BLOOD GAS, ED
Acid-Base Excess: 4 mmol/L — ABNORMAL HIGH (ref 0.0–2.0)
Acid-Base Excess: 5 mmol/L — ABNORMAL HIGH (ref 0.0–2.0)
Bicarbonate: 29.6 mmol/L — ABNORMAL HIGH (ref 20.0–28.0)
Bicarbonate: 31.4 mmol/L — ABNORMAL HIGH (ref 20.0–28.0)
Calcium, Ion: 0.9 mmol/L — ABNORMAL LOW (ref 1.15–1.40)
Calcium, Ion: 1.01 mmol/L — ABNORMAL LOW (ref 1.15–1.40)
HCT: 27 % — ABNORMAL LOW (ref 36.0–46.0)
HCT: 29 % — ABNORMAL LOW (ref 36.0–46.0)
Hemoglobin: 9.2 g/dL — ABNORMAL LOW (ref 12.0–15.0)
Hemoglobin: 9.9 g/dL — ABNORMAL LOW (ref 12.0–15.0)
O2 Saturation: 36 %
O2 Saturation: 55 %
Potassium: 4.7 mmol/L (ref 3.5–5.1)
Potassium: >8.5 mmol/L (ref 3.5–5.1)
Sodium: 125 mmol/L — ABNORMAL LOW (ref 135–145)
Sodium: 129 mmol/L — ABNORMAL LOW (ref 135–145)
TCO2: 31 mmol/L (ref 22–32)
TCO2: 33 mmol/L — ABNORMAL HIGH (ref 22–32)
pCO2, Ven: 51.2 mmHg (ref 44–60)
pCO2, Ven: 55.6 mmHg (ref 44–60)
pH, Ven: 7.36 (ref 7.25–7.43)
pH, Ven: 7.371 (ref 7.25–7.43)
pO2, Ven: 23 mmHg — CL (ref 32–45)
pO2, Ven: 30 mmHg — CL (ref 32–45)

## 2021-06-15 LAB — COMPREHENSIVE METABOLIC PANEL
ALT: 29 U/L (ref 0–44)
AST: 38 U/L (ref 15–41)
Albumin: 2.9 g/dL — ABNORMAL LOW (ref 3.5–5.0)
Alkaline Phosphatase: 150 U/L — ABNORMAL HIGH (ref 38–126)
Anion gap: 16 — ABNORMAL HIGH (ref 5–15)
BUN: 57 mg/dL — ABNORMAL HIGH (ref 6–20)
CO2: 23 mmol/L (ref 22–32)
Calcium: 8.2 mg/dL — ABNORMAL LOW (ref 8.9–10.3)
Chloride: 90 mmol/L — ABNORMAL LOW (ref 98–111)
Creatinine, Ser: 6.5 mg/dL — ABNORMAL HIGH (ref 0.44–1.00)
GFR, Estimated: 7 mL/min — ABNORMAL LOW (ref >60)
Glucose, Bld: 77 mg/dL (ref 70–99)
Potassium: 5.9 mmol/L — ABNORMAL HIGH (ref 3.5–5.1)
Sodium: 129 mmol/L — ABNORMAL LOW (ref 135–145)
Total Bilirubin: 1.6 mg/dL — ABNORMAL HIGH (ref 0.3–1.2)
Total Protein: 7.4 g/dL (ref 6.5–8.1)

## 2021-06-15 LAB — I-STAT CHEM 8, ED
BUN: 63 mg/dL — ABNORMAL HIGH (ref 6–20)
Calcium, Ion: 0.97 mmol/L — ABNORMAL LOW (ref 1.15–1.40)
Chloride: 91 mmol/L — ABNORMAL LOW (ref 98–111)
Creatinine, Ser: 7.6 mg/dL — ABNORMAL HIGH (ref 0.44–1.00)
Glucose, Bld: 81 mg/dL (ref 70–99)
HCT: 29 % — ABNORMAL LOW (ref 36.0–46.0)
Hemoglobin: 9.9 g/dL — ABNORMAL LOW (ref 12.0–15.0)
Potassium: 4.7 mmol/L (ref 3.5–5.1)
Sodium: 129 mmol/L — ABNORMAL LOW (ref 135–145)
TCO2: 27 mmol/L (ref 22–32)

## 2021-06-15 LAB — PROCALCITONIN: Procalcitonin: 0.64 ng/mL

## 2021-06-15 LAB — BASIC METABOLIC PANEL
Anion gap: 17 — ABNORMAL HIGH (ref 5–15)
BUN: 59 mg/dL — ABNORMAL HIGH (ref 6–20)
CO2: 23 mmol/L (ref 22–32)
Calcium: 8.4 mg/dL — ABNORMAL LOW (ref 8.9–10.3)
Chloride: 91 mmol/L — ABNORMAL LOW (ref 98–111)
Creatinine, Ser: 6.98 mg/dL — ABNORMAL HIGH (ref 0.44–1.00)
GFR, Estimated: 6 mL/min — ABNORMAL LOW (ref >60)
Glucose, Bld: 73 mg/dL (ref 70–99)
Potassium: 4.9 mmol/L (ref 3.5–5.1)
Sodium: 131 mmol/L — ABNORMAL LOW (ref 135–145)

## 2021-06-15 LAB — PROTIME-INR
INR: 1.7 — ABNORMAL HIGH (ref 0.8–1.2)
Prothrombin Time: 19.6 seconds — ABNORMAL HIGH (ref 11.4–15.2)

## 2021-06-15 LAB — MRSA NEXT GEN BY PCR, NASAL: MRSA by PCR Next Gen: NOT DETECTED

## 2021-06-15 LAB — ETHANOL: Alcohol, Ethyl (B): <10 mg/dL (ref <10)

## 2021-06-15 MED ORDER — ORAL CARE MOUTH RINSE
15.0000 mL | Freq: Two times a day (BID) | OROMUCOSAL | Status: DC
  Administered 2021-06-15 – 2021-06-18 (×7): 15 mL via OROMUCOSAL

## 2021-06-15 MED ORDER — SODIUM ZIRCONIUM CYCLOSILICATE 10 G PO PACK
10.0000 g | PACK | Freq: Once | ORAL | Status: DC
  Filled 2021-06-15: qty 1

## 2021-06-15 MED ORDER — POLYETHYLENE GLYCOL 3350 17 G PO PACK: 17.0000 g | PACK | Freq: Every day | ORAL | Status: DC | PRN

## 2021-06-15 MED ORDER — EMPTY CONTAINERS FLEXIBLE MISC
1800.0000 mg | Freq: Once | Status: DC
  Filled 2021-06-15: qty 180

## 2021-06-15 MED ORDER — EMPTY CONTAINERS FLEXIBLE MISC
900.0000 mg | Freq: Once | Status: AC
  Administered 2021-06-15: 900 mg via INTRAVENOUS
  Filled 2021-06-15: qty 90

## 2021-06-15 MED ORDER — METOPROLOL TARTRATE 12.5 MG HALF TABLET
12.5000 mg | ORAL_TABLET | Freq: Two times a day (BID) | ORAL | Status: DC
  Administered 2021-06-15 (×2): 12.5 mg via ORAL
  Filled 2021-06-15 (×2): qty 1

## 2021-06-15 MED ORDER — OXYCODONE HCL 5 MG PO TABS: 5.0000 mg | ORAL_TABLET | ORAL | Status: DC | PRN

## 2021-06-15 MED ORDER — INSULIN ASPART 100 UNIT/ML IJ SOLN: 0.0000 [IU] | Freq: Every day | INTRAMUSCULAR | Status: DC

## 2021-06-15 MED ORDER — PANTOPRAZOLE SODIUM 40 MG IV SOLR
40.0000 mg | Freq: Every day | INTRAVENOUS | Status: DC
  Administered 2021-06-15: 40 mg via INTRAVENOUS
  Filled 2021-06-15: qty 10

## 2021-06-15 MED ORDER — INSULIN ASPART 100 UNIT/ML IJ SOLN: 0.0000 [IU] | Freq: Three times a day (TID) | INTRAMUSCULAR | Status: DC

## 2021-06-15 MED ORDER — CHLORHEXIDINE GLUCONATE CLOTH 2 % EX PADS
6.0000 | MEDICATED_PAD | Freq: Every day | CUTANEOUS | Status: DC
  Administered 2021-06-15: 6 via TOPICAL

## 2021-06-15 MED ORDER — DOCUSATE SODIUM 100 MG PO CAPS: 100.0000 mg | ORAL_CAPSULE | Freq: Two times a day (BID) | ORAL | Status: DC | PRN

## 2021-06-15 MED ORDER — OXYCODONE-ACETAMINOPHEN 5-325 MG PO TABS
1.0000 | ORAL_TABLET | ORAL | Status: DC | PRN
  Administered 2021-06-15: 1 via ORAL
  Filled 2021-06-15: qty 1

## 2021-06-15 MED ORDER — CALCIUM GLUCONATE-NACL 1-0.675 GM/50ML-% IV SOLN
1.0000 g | Freq: Once | INTRAVENOUS | Status: AC
  Administered 2021-06-15: 1000 mg via INTRAVENOUS
  Filled 2021-06-15: qty 50

## 2021-06-15 MED ORDER — DEXTROSE-NACL 5-0.45 % IV SOLN: INTRAVENOUS | Status: DC

## 2021-06-15 MED ORDER — CALCITRIOL 0.25 MCG PO CAPS
2.5000 ug | ORAL_CAPSULE | ORAL | Status: DC
  Administered 2021-06-15 – 2021-06-16 (×2): 2.5 ug via ORAL
  Filled 2021-06-15 (×2): qty 10

## 2021-06-15 MED ORDER — DILTIAZEM HCL-DEXTROSE 125-5 MG/125ML-% IV SOLN (PREMIX)
5.0000 mg/h | INTRAVENOUS | Status: DC
  Administered 2021-06-15: 5 mg/h via INTRAVENOUS
  Filled 2021-06-15: qty 125

## 2021-06-15 MED ORDER — CHLORHEXIDINE GLUCONATE CLOTH 2 % EX PADS
6.0000 | MEDICATED_PAD | Freq: Every day | CUTANEOUS | Status: DC
  Administered 2021-06-15 – 2021-06-18 (×3): 6 via TOPICAL

## 2021-06-15 MED ORDER — NALOXONE HCL 4 MG/10ML IJ SOLN
0.2500 mg/h | INTRAVENOUS | Status: DC
  Filled 2021-06-15: qty 10

## 2021-06-15 MED ORDER — PRAMIPEXOLE DIHYDROCHLORIDE 0.25 MG PO TABS
0.5000 mg | ORAL_TABLET | Freq: Three times a day (TID) | ORAL | Status: DC
  Administered 2021-06-15 – 2021-06-18 (×9): 0.5 mg via ORAL
  Filled 2021-06-15 (×14): qty 2

## 2021-06-15 MED ORDER — INSULIN ASPART 100 UNIT/ML IJ SOLN: 0.0000 [IU] | INTRAMUSCULAR | Status: DC

## 2021-06-15 MED ORDER — NALOXONE HCL 0.4 MG/ML IJ SOLN
0.4000 mg | INTRAMUSCULAR | Status: DC | PRN
  Administered 2021-06-15: 0.4 mg via INTRAVENOUS
  Filled 2021-06-15: qty 1

## 2021-06-15 MED ORDER — ONDANSETRON HCL 4 MG/2ML IJ SOLN: 4.0000 mg | Freq: Four times a day (QID) | INTRAMUSCULAR | Status: DC | PRN

## 2021-06-15 MED ORDER — INSULIN ASPART 100 UNIT/ML IJ SOLN
0.0000 [IU] | Freq: Three times a day (TID) | INTRAMUSCULAR | Status: DC
  Administered 2021-06-16: 2 [IU] via SUBCUTANEOUS
  Administered 2021-06-16: 3 [IU] via SUBCUTANEOUS
  Administered 2021-06-16: 2 [IU] via SUBCUTANEOUS
  Administered 2021-06-17 (×2): 3 [IU] via SUBCUTANEOUS
  Administered 2021-06-18: 2 [IU] via SUBCUTANEOUS

## 2021-06-15 NOTE — ED Notes (Signed)
RN entered room w/ pharmacist Byrk to start andexxa. Critical care providers in room at time, pt non-compliant w/ staying in bed, pt had stump hanging through bed rails that began to bleed as pt partially opened staple site. NP Scott aware. Pt has provided to be difficult to obtain access on due to bilateral fistulas and erratic flailing in which pt self removed initial IV access. Education provided w/ multiple attempts to assess understanding. Pt does not appear to absorb education.  ?

## 2021-06-15 NOTE — Consult Note (Signed)
Renal Service ?Consult Note ?[facility] ? ?[name] ?06/15/2021 ?Sol Blazing, MD ?Requesting Physician: Dr. Vaughan Browner ? ?Reason for Consult: ESRD pt w/ AMS ?HPI: The patient is a 56 y.o. year-old w/ hx of anemia, anxiety, ESRD on HD, HL, HTN, PNA, PAD, RA, OSA, DM2 on insulin who presented to ED from SNF for AMS. She had been going out and coming back from the SNF and this time she came back she was acting strange. She rec'd Narcan IV then narcan gtt w/ some response. CT showed 4 mm SDH. Eliquis was reversed w/ Andexxa. IV dilt started for afib /RVR. No signs of infection, afeb w/ normal WBC. Asked to see for ESRD.   ? ?Pt seen in ICU. Pt is confused and lethargic. Does follow simple commands. ? ?Recent admit 4/24 - 06/11/21 > for bleeding and order of the toes on the R foot. She had evidence of L foot infection. 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 Blood cx's initially were+ for enterobacter aerogenes. F/u cx's no growth. Had AMS due to cefepime, vs opiates and got narcan and temp bipap and cefepime was dc'd. MS improved. She also had afib / RVR issues, esrd on HD, anemia, DM2. ? ?ROS - n/a ? ? ?Past Medical History  ?Past Medical History:  ?Diagnosis Date  ? Anaphylactic shock, unspecified, initial encounter 09/04/2018  ? Anemia   ? Anxiety   ? CAD (coronary artery disease)   ? Nonobstructive on CT 2019  ? CHF (congestive heart failure) ([place])   ? Chronic bilateral pleural effusions 10/23/2018  ? COVID-19 10/2018  ? COVID-19 virus detected 10/10/2018  ? COVID-19 virus infection 09/18/2018  ? ESRD (end stage renal disease) ([place])   ? Dialysis TTHSat- 3rd st  ? Gangrene ([place]) 09/18/2018  ? Headache(784.0)   ? High cholesterol   ? History of blood transfusion   ? Hypertension   ? Mitral regurgitation   ? moderate to severe MR 10/2018 echo  ? Nerve pain   ? "they say I have L5 nerve damage; my lumbar"  ? Neuropathy 01/29/2011  ? 04/2011 MRI L-spine:   L5-S1: Bulge/shallow broad-based protrusion greatest centrally and in the right posterior lateral position. Minimal crowding of the  upper aspect of the S1 nerve root greater on the right. Left lateral disc osteophyte with mild encroachment upon but not  significant compression of the exiting left L5 nerve root.   05/2011: Dr. Sherwood Gambler ([facility]): DJD and mild disc bu  ? Pericardial effusion   ? Pneumonia   ? ; 11/16/2018- "touch of pneumonia" - seen in ED- 11/14/2018- on antibiotic. Has had it x 2  ? Pneumonia 10/10/2018  ? PVD (peripheral vascular disease) ([place])   ? Right leg stent in [place].  (No records)  ? Restless legs   ? Rheumatoid arthritis ([place])   ? "knees" "hands", "RA"  ? Sleep apnea   ? does not use Cpap  ? Thoracic ascending aortic aneurysm ([place])   ? 4.4 cm 11/14/18 CTA  ? Type II diabetes mellitus ([place])   ? Uterine leiomyoma 01/10/2021  ? ?Past Surgical History  ?Past Surgical History:  ?Procedure Laterality Date  ? ABDOMINAL AORTOGRAM W/LOWER EXTREMITY N/A 05/27/2021  ? Procedure: ABDOMINAL AORTOGRAM W/LOWER EXTREMITY;  Surgeon: Broadus John, MD;  Location: [place] [facility];  Service: Cardiovascular;  Laterality: N/A;  ? AMPUTATION Left 12/27/2018  ? Procedure: LEFT THUMB  REVISION AMPUTATION DIGIT;  Surgeon: Charlotte Crumb, MD;  Location: [place];  Service: Orthopedics;  Laterality: Left;  ? AMPUTATION Left 01/15/2021  ? Procedure: AMPUTATION OF DIGIT LEFT INDEX FINGER;  Surgeon: Leanora Cover, MD;  Location: [place];  Service: Orthopedics;  Laterality: Left;  ? AMPUTATION Left 05/28/2021  ? Procedure: LEFT 5TH TOE RAY AMPUTATION;  Surgeon: Waynetta Sandy, MD;  Location: [place];  Service: Vascular;  Laterality: Left;  ? AMPUTATION Left 06/02/2021  ? Procedure: AMPUTATION BELOW KNEE;  Surgeon: Broadus John, MD;  Location: [place];  Service: Vascular;  Laterality: Left;  ? AV FISTULA PLACEMENT Left   ? AV FISTULA PLACEMENT Right 03/12/2019  ? Procedure: INSERTION OF ARTERIOVENOUS  (AV) GORE-TEX GRAFT ARM;  Surgeon: Elam Dutch, MD;  Location: [facility] OR;  Service: Vascular;  Laterality: Right;  ? AV FISTULA PLACEMENT Right 08/13/2019  ? Procedure: RIGHT UPPER ARM ARTERIOVENOUS (AV) FISTULA CREATION WITH BASILIC VEIN;  Surgeon: Elam Dutch, MD;  Location: [place];  Service: Vascular;  Laterality: Right;  ? [place]; 1997  ? x 2  ? COLONOSCOPY    ? EYE SURGERY Bilateral   ? cataract surgery  ? I & D EXTREMITY Left 01/09/2021  ? Procedure: IRRIGATION AND DEBRIDEMENT INDEX FINGER;  Surgeon: Leanora Cover, MD;  Location: [place];  Service: Orthopedics;  Laterality: Left;  ? INSERTION OF DIALYSIS CATHETER Right 09/26/2018  ? Procedure: INSERTION OF DIALYSIS CATHETER Right Internal Jugular.;  Surgeon: Elam Dutch, MD;  Location[facility] OR;  Service: Vascular;  Laterality: Right;  ? IR DIALY SHUNT INTRO NEEDLE/INTRACATH INITIAL W/IMG RIGHT Right 11/21/2020  ? IR FLUORO GUIDE CV LINE RIGHT  05/04/2019  ? IR US GUIDE VASC ACCESS RIGHT  05/04/2019  ? IR US GUIDE VASC ACCESS RIGHT  11/21/2020  ? LIGATION OF ARTERIOVENOUS  FISTULA Left 09/26/2018  ? Procedure: LIGATION OF ARTERIOVENOUS  FISTULA LEFT ARM;  Surgeon: Elam Dutch, MD;  Location: [place];  Service: Vascular;  Laterality: Left;  ? PERICARDIAL WINDOW  02/2004  ? for pericardial effusion  ? PERIPHERAL VASCULAR ATHERECTOMY  05/27/2021  ? Procedure: PERIPHERAL VASCULAR ATHERECTOMY;  Surgeon: Broadus John, MD;  Location: [place] [facility];  Service: Cardiovascular;;  Left AT  ? PERIPHERAL VASCULAR INTERVENTION Right 10/26/2019  ? Procedure: PERIPHERAL VASCULAR INTERVENTION;  Surgeon: Elam Dutch, MD;  Location: [place] [facility];  Service: Cardiovascular;  Laterality: Right;  arm  AV fistula  ? TUBAL LIGATION  05/1995  ? UPPER EXTREMITY VENOGRAPHY N/A 06/22/2019  ? Procedure: UPPER EXTREMITY VENOGRAPHY - Right Central;  Surgeon: Elam Dutch, MD;  Location: [place]  [facility];  Service: Cardiovascular;  Laterality: N/A;   ? ?Family History  ?Family History  ?Problem Relation Age of Onset  ? Cancer Brother   ? Heart disease Father   ?     Died age 107  ? Diabetes Father   ? Hyperlipidemia Father   ? Hypertension Father   ? Stroke Father   ? Diabetes Mother   ? Hypertension Mother   ? Stroke Mother   ? Dementia Mother   ? Diabetes Sister   ? Diabetes Brother   ? Hypertension Sister   ? Hypertension Brother   ? ?Social History  reports that she has been smoking cigarettes. She has a 8.25 pack-year smoking history. She has never used smokeless tobacco. She reports that she does not drink alcohol and does not use drugs. ?Allergies  ?  Allergies  ?Allergen Reactions  ? Bupropion Itching  ? Dilaudid [Hydromorphone] Other (See Comments)  ?  Apnea, required intubation  ? ?Home medications ?Prior to Admission medications   ?Medication Sig Start Date End Date Taking? Authorizing Provider  ?acetaminophen (TYLENOL) 500 MG tablet Take 1,000 mg by mouth daily as needed for headache (pain).   Yes [provider]  ?albuterol (VENTOLIN HFA) 108 (90 Base) MCG/ACT inhaler Inhale 2 puffs into the lungs every 6 (six) hours as needed for wheezing or shortness of breath. 02/22/20  Yes Thurnell Lose, MD  ?apixaban (ELIQUIS) 5 MG TABS tablet Take 1 tablet (5 mg total) by mouth 2 (two) times daily. 06/11/21  Yes Gherghe, Vella Redhead, MD  ?AURYXIA 1 GM 210 MG(Fe) tablet Take 420-840 mg by mouth See admin instructions. 840 mg twice daily with meals, ? 420 mg with a snacks 05/29/18  Yes [provider]  ?b complex-vitamin c-folic acid (NEPHRO-VITE) 0.8 MG TABS tablet Take 1 tablet by mouth every Monday, Wednesday, and Friday with hemodialysis. 04/12/19  Yes [provider]  ?bisacodyl (DULCOLAX) 10 MG suppository Place 10 mg rectally as needed for moderate constipation.   Yes [provider]  ?clopidogrel (PLAVIX) 75 MG tablet Take 1 tablet (75 mg total) by mouth daily with breakfast. 06/11/21  Yes Gherghe, Vella Redhead, MD  ?HUMIRA PEN  40 MG/0.4ML PNKT Inject 40 mg into the skin every 14 (fourteen) days. 08/22/20  Yes [provider]  ?hydrOXYzine (ATARAX/VISTARIL) 25 MG tablet Take 25 mg by mouth every morning.   Yes Provider,

## 2021-06-15 NOTE — ED Triage Notes (Signed)
Pt arrived from [facility] with EMS for AMS. EMS arrived to facility, pt had inadequate breathing, pupils 2. Given '1mg'$  Narcan with improvement. Per staff, pt has been leaving the facility and coming back in more altered. 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 Pt lethargic but answering questions. MWF dialysis pt; uses fistula to R upper arm; old fistula to L arm. EMS placed 20g IV to R arm. EMS VS 160/78, pulse 76, CBG 171 ?

## 2021-06-15 NOTE — Consult Note (Signed)
Reason for Consult:SDH ?Referring Physician: Fatima Blank, MD  ? ? ?HPI: [name], is a 56 y.o. 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 The patient is from [place] and was reported to have left the facility and returned altered. EMS was notified and administered Narcan. On arrival, she was unresponsive. She was subsequently started on a Narcan gtt with response. CT head was obtained and was concerning for subdural hemorrhage.  Patient on Eliquis and was reversed with Andexxa.  Patient was also found to be in A-fib with RVR and started on a diltiazem drip.  Neurosurgery consult was requested due to findings in the CT head.  ? ?Past Medical History:  ?Diagnosis Date  ? Anaphylactic shock, unspecified, initial encounter 09/04/2018  ? Anemia   ? Anxiety   ? CAD (coronary artery disease)   ? Nonobstructive on CT 2019  ? CHF (congestive heart failure) ([place])   ? Chronic bilateral pleural effusions 10/23/2018  ? COVID-19 10/2018  ? COVID-19 virus detected 10/10/2018  ? COVID-19 virus infection 09/18/2018  ? ESRD (end stage renal disease) ([place])   ? Dialysis TTHSat- 3rd st  ? Gangrene ([place]) 09/18/2018  ? Headache(784.0)   ? High cholesterol   ? History of blood transfusion   ? Hypertension   ? Mitral regurgitation   ? moderate to severe MR 10/2018 echo  ? Nerve pain   ? "they say I have L5 nerve damage; my lumbar"  ? Neuropathy 01/29/2011  ? 04/2011 MRI L-spine:  L5-S1: Bulge/shallow broad-based protrusion greatest centrally and in the right posterior lateral position. Minimal crowding of the  upper aspect of the S1 nerve root greater on the right. Left lateral disc osteophyte with mild encroachment upon but not  significant compression of the exiting left L5 nerve root.   05/2011: Dr. Sherwood Gambler ([facility]): DJD and mild disc bu  ?  Pericardial effusion   ? Pneumonia   ? ; 11/16/2018- "touch of pneumonia" - seen in ED- 11/14/2018- on antibiotic. Has had it x 2  ? Pneumonia 10/10/2018  ? PVD (peripheral vascular disease) ([place])   ? Right leg stent in [place].  (No records)  ? Restless legs   ? Rheumatoid arthritis ([place])   ? "knees" "hands", "RA"  ? Sleep apnea   ? does not use Cpap  ? Thoracic ascending aortic aneurysm ([place])   ? 4.4 cm 11/14/18 CTA  ? Type II diabetes mellitus ([place])   ? Uterine leiomyoma 01/10/2021  ? ? ?Past Surgical History:  ?Procedure Laterality Date  ? ABDOMINAL AORTOGRAM W/LOWER EXTREMITY N/A 05/27/2021  ? Procedure: ABDOMINAL AORTOGRAM W/LOWER EXTREMITY;  Surgeon: Broadus John, MD;  Location: [place] [facility];  Service: Cardiovascular;  Laterality: N/A;  ? AMPUTATION Left 12/27/2018  ? Procedure: LEFT THUMB REVISION AMPUTATION DIGIT;  Surgeon: Charlotte Crumb, MD;  Location: [place];  Service: Orthopedics;  Laterality: Left;  ? AMPUTATION Left 01/15/2021  ? Procedure: AMPUTATION OF DIGIT LEFT INDEX FINGER;  Surgeon: Leanora Cover, MD;  Location: [place];  Service: Orthopedics;  Laterality: Left;  ? AMPUTATION Left 05/28/2021  ? Procedure: LEFT 5TH TOE RAY AMPUTATION;  Surgeon: Waynetta Sandy, MD;  Location: [place];  Service: Vascular;  Laterality: Left;  ? AMPUTATION Left 06/02/2021  ? Procedure: AMPUTATION BELOW KNEE;  Surgeon: Virl Cagey,  Carolann Littler, MD;  Location: [place];  Service: Vascular;  Laterality: Left;  ? AV FISTULA PLACEMENT Left   ? AV FISTULA PLACEMENT Right 03/12/2019  ? Procedure: INSERTION OF ARTERIOVENOUS (AV) GORE-TEX GRAFT ARM;  Surgeon: Elam Dutch, MD;  Location: [facility] OR;  Service: Vascular;  Laterality: Right;  ? AV FISTULA PLACEMENT Right 08/13/2019  ? Procedure: RIGHT UPPER ARM ARTERIOVENOUS (AV) FISTULA CREATION WITH BASILIC VEIN;  Surgeon: Elam Dutch, MD;  Location: [place];  Service: Vascular;  Laterality: Right;  ? [place]; 1997  ? x 2  ? COLONOSCOPY    ? EYE SURGERY  Bilateral   ? cataract surgery  ? I & D EXTREMITY Left 01/09/2021  ? Procedure: IRRIGATION AND DEBRIDEMENT INDEX FINGER;  Surgeon: Leanora Cover, MD;  Location: [place];  Service: Orthopedics;  Laterality: Left;  ? INSERTION OF DIALYSIS CATHETER Right 09/26/2018  ? Procedure: INSERTION OF DIALYSIS CATHETER Right Internal Jugular.;  Surgeon: Elam Dutch, MD;  Location: [facility] OR;  Service: Vascular;  Laterality: Right;  ? IR DIALY SHUNT INTRO NEEDLE/INTRACATH INITIAL W/IMG RIGHT Right 11/21/2020  ? IR FLUORO GUIDE CV LINE RIGHT  05/04/2019  ? IR US GUIDE VASC ACCESS RIGHT  05/04/2019  ? IR US GUIDE VASC ACCESS RIGHT  11/21/2020  ? LIGATION OF ARTERIOVENOUS  FISTULA Left 09/26/2018  ? Procedure: LIGATION OF ARTERIOVENOUS  FISTULA LEFT ARM;  Surgeon: Elam Dutch, MD;  Location: [place];  Service: Vascular;  Laterality: Left;  ? PERICARDIAL WINDOW  02/2004  ? for pericardial effusion  ? PERIPHERAL VASCULAR ATHERECTOMY  05/27/2021  ? Procedure: PERIPHERAL VASCULAR ATHERECTOMY;  Surgeon: Broadus John, MD;  Location: [place] [facility];  Service: Cardiovascular;;  Left AT  ? PERIPHERAL VASCULAR INTERVENTION Right 10/26/2019  ? Procedure: PERIPHERAL VASCULAR INTERVENTION;  Surgeon: Elam Dutch, MD;  Location: [place] [facility];  Service: Cardiovascular;  Laterality: Right;  arm  AV fistula  ? TUBAL LIGATION  05/1995  ? UPPER EXTREMITY VENOGRAPHY N/A 06/22/2019  ? Procedure: UPPER EXTREMITY VENOGRAPHY - Right Central;  Surgeon: Elam Dutch, MD;  Location: [place] [facility];  Service: Cardiovascular;  Laterality: N/A;  ? ? ?Family History  ?Problem Relation Age of Onset  ? Cancer Brother   ? Heart disease Father   ?     Died age 49  ? Diabetes Father   ? Hyperlipidemia Father   ? Hypertension Father   ? Stroke Father   ? Diabetes Mother   ? Hypertension Mother   ? Stroke Mother   ? Dementia Mother   ? Diabetes Sister   ? Diabetes Brother   ? Hypertension Sister   ? Hypertension Brother   ? ? ?Social History:  reports  that she has been smoking cigarettes. She has a 8.25 pack-year smoking history. She has never used smokeless tobacco. She reports that she does not drink alcohol and does not use drugs. ? ?Allergies:  ?Allergies  ?Allergen Reactions  ? Bupropion Itching  ? Dilaudid [Hydromorphone] Other (See Comments)  ?  Apnea, required intubation  ? ? ?Medications: I have reviewed the patient's current medications. ? ?Results for orders placed or performed during the hospital encounter of 06/15/21 (from the past 48 hour(s))  ?Comprehensive metabolic panel     Status: Abnormal  ? Collection Time: 06/15/21 12:36 AM  ?Result Value Ref Range  ? Sodium 129 (L) 135 - 145 mmol/L  ? Potassium 5.9 (H) 3.5 - 5.1 mmol/L  ?  Chloride 90 (L) 98 - 111 mmol/L  ? CO2 23 22 - 32 mmol/L  ? Glucose, Bld 77 70 - 99 mg/dL  ?  Comment: Glucose reference range applies only to samples taken after fasting for at least 8 hours.  ? BUN 57 (H) 6 - 20 mg/dL  ? Creatinine, Ser 6.50 (H) 0.44 - 1.00 mg/dL  ? Calcium 8.2 (L) 8.9 - 10.3 mg/dL  ? Total Protein 7.4 6.5 - 8.1 g/dL  ? Albumin 2.9 (L) 3.5 - 5.0 g/dL  ? AST 38 15 - 41 U/L  ? ALT 29 0 - 44 U/L  ? Alkaline Phosphatase 150 (H) 38 - 126 U/L  ? Total Bilirubin 1.6 (H) 0.3 - 1.2 mg/dL  ? GFR, Estimated 7 (L) >60 mL/min  ?  Comment: (NOTE) ?Calculated using the CKD-EPI Creatinine Equation (2021) ?  ? Anion gap 16 (H) 5 - 15  ?  Comment: Performed at [facility], [place] [address] [place], [place] [address]  ?CBC with Differential/Platelet     Status: Abnormal  ? Collection Time: 06/15/21 12:36 AM  ?Result Value Ref Range  ? WBC 7.0 4.0 - 10.5 K/uL  ? RBC 2.94 (L) 3.87 - 5.11 MIL/uL  ? Hemoglobin 9.0 (L) 12.0 - 15.0 g/dL  ? HCT 28.8 (L) 36.0 - 46.0 %  ? MCV 98.0 80.0 - 100.0 fL  ? MCH 30.6 26.0 - 34.0 pg  ? MCHC 31.3 30.0 - 36.0 g/dL  ? RDW 16.8 (H) 11.5 - 15.5 %  ? Platelets 203 150 - 400 K/uL  ? nRBC 0.4 (H) 0.0 - 0.2 %  ? Neutrophils Relative % 68 %  ? Neutro Abs 4.8 1.7 - 7.7 K/uL  ? Lymphocytes  Relative 17 %  ? Lymphs Abs 1.2 0.7 - 4.0 K/uL  ? Monocytes Relative 8 %  ? Monocytes Absolute 0.5 0.1 - 1.0 K/uL  ? Eosinophils Relative 6 %  ? Eosinophils Absolute 0.4 0.0 - 0.5 K/uL  ? Basophils Relative 1 %  ? Clarise Cruz

## 2021-06-15 NOTE — ED Notes (Signed)
Verbalized with MD that pt has bilateral Dialysis fistulas. MD made aware of bilateral access and verbalized verbal okay for the bilateral acces due to patient being a hard sick. MD Duwayne Heck. ?

## 2021-06-15 NOTE — Progress Notes (Addendum)
eLink Physician-Brief Progress Note ?Patient Name: [name] ?DOB: Nov 24, 1965 ?MRN: [id number] ? ? ?Date of Service ? 06/15/2021  ?HPI/Events of Note ? Pt is on a diet and will need to changed insulin sliding scale coverage.  ?eICU Interventions ? Change POC glucose checks to AC and HS.   ?Decrease to moderate range sliding scale AC and HS.  ? ? ? ?Intervention Category ?Intermediate Interventions: Hyperglycemia - evaluation and treatment ? ?Elsie Lincoln ?06/15/2021, 8:25 PM ? ?10:44 PM ?Pt requesting for percocet 10/325 which she takes for chronic shoulder pain.  ? ?Pt is awake and alert. ? ?Plan> ?Will restart percocet 10/325. ?

## 2021-06-15 NOTE — ED Provider Notes (Signed)
?[facility] ?Provider Note ? ?CSN: [id number] ?Arrival date & time: 06/15/21 0003 ? ?Chief Complaint(s) ?Altered Mental Status ?ED Triage Notes ?Catherine Senters, [name] (Registered Nurse)   Date of Service: 06/15/2021 12:15 AM   Signed ?  ?Pt arrived from [place] with EMS for AMS. EMS arrived to facility, pt had inadequate breathing, pupils 2. Given 67m Narcan with improvement. Per staff, pt has been leaving the facility and coming back in more altered. 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 Pt lethargic but answering questions. MWF dialysis pt; uses fistula to R upper arm; old fistula to L arm. EMS placed 20g IV to R arm. EMS VS 160/78, pulse 76, CBG   ?  ? ?HPI ?Catherine FENTERis a 56y.o. female with extensive past medical history listed below including ESRD on dialysis MWF, hypertension, hyperlipidemia, diabetes, peripheral vascular disease and osteomyelitis status post recent left BKA here from a facility for altered mental status.  Patient apparently went home and returned altered.  EMS was called and noted patient was lethargic with pinpoint pupils.  She responded to Narcan.  Patient denies taking any narcotic medications at home.  Denies any alcohol use or other illicit drug use. ? ? ?Altered Mental Status ? ?Past Medical History ?Past Medical History:  ?Diagnosis Date  ? Anaphylactic shock, unspecified, initial encounter 09/04/2018  ? Anemia   ? Anxiety   ? CAD (coronary artery disease)   ? Nonobstructive on CT 2019  ? CHF (congestive heart failure) ([place]   ? Chronic bilateral pleural effusions 10/23/2018  ? COVID-19 10/2018  ? COVID-19 virus detected 10/10/2018  ? COVID-19 virus infection 09/18/2018  ? ESRD (end stage renal disease) ([place]   ? Dialysis TTHSat- 3rd st  ? Gangrene ([place] 09/18/2018  ? Headache(784.0)   ? High cholesterol   ? History of blood transfusion   ? Hypertension   ? Mitral regurgitation   ? moderate to severe MR 10/2018 echo  ?  Nerve pain   ? "they say I have L5 nerve damage; my lumbar"  ? Neuropathy 01/29/2011  ? 04/2011 MRI L-spine:  L5-S1: Bulge/shallow broad-based protrusion greatest centrally and in the right posterior lateral position. Minimal crowding of the  upper aspect of the S1 nerve root greater on the right. Left lateral disc osteophyte with mild encroachment upon but not  significant compression of the exiting left L5 nerve root.   05/2011: Dr. NSherwood Gambler([facility]): DJD and mild disc bu  ? Pericardial effusion   ? Pneumonia   ? ; 11/16/2018- "touch of pneumonia" - seen in ED- 11/14/2018- on antibiotic. Has had it x 2  ? Pneumonia 10/10/2018  ? PVD (peripheral vascular disease) ([place]   ? [place]  (No records)  ? Restless legs   ? Rheumatoid arthritis ([place]   ? "knees" "hands", "RA"  ? Sleep apnea   ? does not use Cpap  ? Thoracic ascending aortic aneurysm (HF[place]   ? 4.4 cm 11/14/18 CTA  ? Type II diabetes mellitus ([place]   ? Uterine leiomyoma 01/10/2021  ? ?Patient Active Problem List  ? Diagnosis Date Noted  ? Atrial fibrillation with RVR ([place] 06/05/2021  ? Asterixis 06/04/2021  ? Acute metabolic encephalopathy 050/72/2575 ? Bacteremia due to Enterobacter aerogenes 06/03/2021  ? Hypotension 06/03/2021  ? Sepsis (HGas 05/26/2021  ? PVD (peripheral vascular disease) (HEast [place]   ? Osteomyelitis (HS[place] 05/25/2021  ? Acute  respiratory failure with hypoxia ([place]) 04/26/2021  ? Hypertensive urgency   ? Acute pulmonary edema (HCC)   ? Obesity (BMI 30-39.9) 01/10/2021  ? Finger infection, left hand, second digit 01/10/2021  ? Pulmonary nodule seen on imaging study 01/10/2021  ? 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 Ventral hernia 01/10/2021  ? 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 Rheumatoid arthritis ([place]) 01/10/2021  ? Osteomyelitis of left hand ([place]) 01/10/2021  ? Possible osteomyelitis of finger of left hand ([place]) 01/09/2021  ? Abnormal CT scan, chest  11/27/2020  ? OSA (obstructive sleep apnea), concern for 11/27/2020  ? Thyroid nodule 04/10/2020  ? Renal mass, right 08/15/2019  ? Dyslipidemia 11/20/2018  ? Shortness of breath 10/23/2018  ? Morbid obesity ([place]) 10/23/2018  ? Coronary artery disease 10/23/2018  ? Tobacco abuse 10/23/2018  ? Unspecified protein-calorie malnutrition ([place]) 10/11/2018  ? End-stage renal disease on hemodialysis ([place]) 09/27/2018  ? ESRD (end stage renal disease) ([place]) 09/26/2018  ? Chronic pain disorder 09/26/2018  ? left foot wound with unreconstructable vascular disease 09/18/2018  ? Iron deficiency anemia, unspecified 01/10/2018  ? Coagulation defect, unspecified ([place]) 12/06/2017  ? Itching 09/22/2017  ? Secondary hyperparathyroidism of renal origin ([place]) 09/01/2017  ? Disorder of phosphorus metabolism, unspecified 08/25/2017  ? Anemia in chronic kidney disease 08/24/2017  ? Left ventricular hypertrophy 06/17/2017  ? Diabetic retinopathy 05/21/2011  ? Diastolic dysfunction 78/24/2353  ? Hyperlipidemia associated with type 2 diabetes mellitus ([place]) 01/30/2011  ? Type 2 diabetes mellitus with diabetic chronic kidney disease ([place]) 01/29/2011  ? Hypertension associated with diabetes ([place])   ? ?Home Medication(s) ?Prior to Admission medications   ?Medication Sig Start Date End Date Taking? Authorizing Provider  ?acetaminophen (TYLENOL) 500 MG tablet Take 1,000 mg by mouth daily as needed for headache (pain).   Yes [provider]  ?albuterol (VENTOLIN HFA) 108 (90 Base) MCG/ACT inhaler Inhale 2 puffs into the lungs every 6 (six) hours as needed for wheezing or shortness of breath. 02/22/20  Yes Thurnell Lose, MD  ?apixaban (ELIQUIS) 5 MG TABS tablet Take 1 tablet (5 mg total) by mouth 2 (two) times daily. 06/11/21  Yes Gherghe, Vella Redhead, MD  ?AURYXIA 1 GM 210 MG(Fe) tablet Take 420-840 mg by mouth See admin instructions. 840 mg twice daily with meals, ? 420 mg with a snacks 05/29/18  Yes [provider]  ?b  complex-vitamin c-folic acid (NEPHRO-VITE) 0.8 MG TABS tablet Take 1 tablet by mouth every Monday, Wednesday, and Friday with hemodialysis. 04/12/19  Yes [provider]  ?bisacodyl (DULCOLAX) 10 MG suppository Place 10 mg rectally as needed for moderate constipation.   Yes [provider]  ?clopidogrel (PLAVIX) 75 MG tablet Take 1 tablet (75 mg total) by mouth daily with breakfast. 06/11/21  Yes Gherghe, Vella Redhead, MD  ?HUMIRA PEN 40 MG/0.4ML PNKT Inject 40 mg into the skin every 14 (fourteen) days. 08/22/20  Yes [provider]  ?hydrOXYzine (ATARAX/VISTARIL) 25 MG tablet Take 25 mg by mouth every morning.   Yes [provider]  ?Insulin Lispro Prot & Lispro (HUMALOG 75/25 MIX) (75-25) 100 UNIT/ML Kwikpen Inject 35 Units into the skin 2 (two) times daily with a meal. 05/01/20  Yes Renato Shin, MD  ?metoprolol tartrate (LOPRESSOR) 25 MG tablet Take 0.5 tablets (12.5 mg total) by mouth 2 (two) times daily. 06/11/21  Yes Caren Griffins, MD  ?midodrine (PROAMATINE) 10 MG tablet Take 1 tablet (10 mg total)  by mouth as needed (as needed for hypotension in dialysis). 06/11/21  Yes Caren Griffins, MD  ?Karma Greaser 4 MG/0.1ML LIQD nasal spray kit Place 1 spray into the nose as needed (accidental overdose.). 10/10/19  Yes [provider]  ?NON FORMULARY BIPAP at bedtime   Yes [provider]  ?omega-3 fish oil (MAXEPA) 1000 MG CAPS capsule Take 1,000 mg by mouth daily.   Yes [provider]  ?oxyCODONE-acetaminophen (PERCOCET) 10-325 MG tablet Take 1 tablet by mouth 3 (three) times daily as needed for pain. 06/11/21  Yes Caren Griffins, MD  ?OXYGEN Inhale 2 L into the lungs continuous.   Yes [provider]  ?Pollen Extracts (PROSTAT PO) Take 30 mLs by mouth in the morning and at bedtime.   Yes [provider]  ?pramipexole (MIRAPEX) 0.5 MG tablet Take 0.5 mg by mouth daily.  05/18/17  Yes [provider]  ?rosuvastatin (CRESTOR) 40 MG  tablet TAKE 1 TABLET BY MOUTH DAILY *PATIENT NEEDS APPOINTMENT* ?Patient taking differently: Take 40 mg by mouth at bedtime. 12/08/20  Yes Minus Breeding, MD  ?Sodium Phosphates (RA SALINE ENEMA RE) Place 1 Dose rec

## 2021-06-15 NOTE — Progress Notes (Signed)
? ?NAME:  [name], MRN:  [id number], DOB:  01-04-1966, LOS: 0 ?ADMISSION DATE:  06/15/2021, CONSULTATION DATE:  5/15 ?REFERRING MD:  Cardama, CHIEF COMPLAINT:  AMS  ? ?History of Present Illness:  ?[name], is a 56 y.o. female, who presented to the [facility] ED with a chief complaint of AMS ? 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 ?Patient from [facility]. Prior to arrival, reports of patient leaving facility and coming back altered. EMS called on 5/14 for AMS, responded to to narcan. ? ?ED course was notable for continued unresponsiveness.  Patient started on Narcan drip with response.  Head CT CT was obtained which was concerning for a small 4 mm subdural hemorrhage.  Neurosurgery was consulted.  Patient was previously on Eliquis and was reversed with Andexxa.  Patient was also found to be in A-fib with RVR and started on a diltiazem drip.  Afebrile.  No leukocytosis. ? ?PCCM was consulted for admission ? 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 ?Significant Hospital Events: ?Including procedures, antibiotic start and stop dates in addition to other pertinent events   ?5/15 presented to [facility] concerning for subdural.  Eliquis reversed.  PCCM consult.  She was briefly on Narcan drip ? ?Interim History / Subjective:  ?More awake today.  Continues on Cardizem drip ? ?Objective   ?Blood pressure (!) 117/97, pulse 83, temperature 98.7 ?F (37.1 ?C), temperature source Oral, resp. rate 20, height '5\' 8"'$  (1.727 m), weight 119.1 kg, SpO2 90 %. ?   ?   ? ?Intake/Output Summary (Last 24 hours) at 06/15/2021 0858 ?Last data filed at 06/15/2021 0700 ?Gross per 24 hour  ?Intake 202.79 ml  ?Output 0 ml  ?Net 202.79 ml  ? ?Filed Weights  ? 06/15/21 0458  ?Weight: 119.1 kg   ? ? ?Examination: ?Gen:      No acute distress ?HEENT:  EOMI, sclera anicteric ?Neck:     No masses; no thyromegaly ?Lungs:    Clear to auscultation bilaterally; normal respiratory effort ?CV:         Irregular ?Abd:      + bowel sounds; soft, non-tender; no palpable masses, no distension ?Ext:    Left BKA, right lower extremity ulcer, left hand digital amputations ?Skin:      Warm and dry; no rash ?Neuro: Awake, responsive ? ?Labs/imaging reviewed.   ?Significant for sodium 129, BUN/creatinine 63/7.60 ?WBC 7, hemoglobin 9 ?No new imaging ? ?Resolved Hospital Problem list   ? ? ?Assessment & Plan:  ?Altered mental status, question secondary to opioid overdose versus subdural hemorrhage.  Patient afebrile with no significant leukocytosis, do not suspect infectious etiology. ??  Opioid overdose, responsive to Narcan, patient denies narcotic use.  Denies illicit drug use ?Possible 4 mm subdural hemorrhage as seen on CT head 5/15, unclear etiology no reports of head trauma, patient on anticoagulation ?Neurosurgery consulted.  No indication for surgery at present.  Repeat CT at 1 PM today ?Eliquis reversed with Andexxa ?Continue neuro monitoring ? ? ?Afib with RVR on eliquis ?Off anticoagulation ?Continue Cardizem drip ? ?Hyperkalemia > improved ?End-stage renal disease ?Monitor labs ?Nephrology consulted ? ? ?Hyponatremia ?Sodium 129. Appears hyponatremic for past 7 days. ?Monitor labs ? ?HX CAD ?HX  CHF, last echo EF 60 to 65% on 06/02/2021 ?HX HLD ?HX HTN ?-Continue rosuvastatin when able to take p.o. ?-Hold home antihypertensives at this time ? ?Normocytic, anemia  ?suspect chronic. history of kidney disease ?-Transfuse PRBC if HBG less than 7 ?-Obtain AM CBC to trend H&H ?-Monitor for signs of bleeding ? ?DM2 ?-Blood Glucose goal 140-180. ?-SSI ? ?Left BKA ?Right venous stasis wound ?-Wound care consult ? ?Best Practice (right click and "Reselect all SmartList Selections" daily)  ? ?Diet/type: NPO ?DVT prophylaxis:  SCD ?GI prophylaxis: PPI ?Lines: N/A ?Foley:  N/A ?Code Status:  full code ?Last date of multidisciplinary goals of care discussion [pending] ? ?Critical care time:   ? ?The patient is critically ill with multiple organ system failure and requires high complexity decision making for assessment and support, frequent evaluation and titration of therapies, advanced monitoring, review of radiographic studies and interpretation of complex data.  ? ?Critical Care Time devoted to patient care services, exclusive of separately billable procedures, described in this note is 35 minutes.  ? ?Marshell Garfinkel MD ?[facility] ?See Amion for pager ? ?If no response to pager , please call [phone] until 7pm ?After 7:00 pm call Elink  [phone] ?06/15/2021, 9:13 AM  ? ? ?

## 2021-06-15 NOTE — Progress Notes (Addendum)
eLink Physician-Brief Progress Note ?Patient Name: [name] ?DOB: 1965-09-14 ?MRN: [id number] ? ? ?Date of Service ? 06/15/2021  ?HPI/Events of Note ? 55/F with hx of ESRD pm JD, L BKA on 06/02/21, atrial fibrillation on eliquis, CADm presenting with altered mental status.  Pt responded to Narcan and placed on narcan gtt.  CT head showing possible subdural bleed upto 22m in thickness.  Pt given andexxa. Narcan gtt has since been held. ? ?Pt with afib in RVR and started on cardizem gtt. Pt also hyperkalemic with K >8.5 and with medical management, K has improved to 4.7.  ? ?BP HR 87, RR 15, O2 sats 96% ?  ?eICU Interventions ? Encephalopathy ?Subdural bleed ?Atrial fibrillation on eliquis ?Hyperkalemia ?Anemia ?DM ?ESRD on HD ? ?Continue to hold off on Narcan with RR >15. ?Atrial fibrillation is rate controlled.  ?Repeat CT head in 12 hours as per neurosurgery recommendations.  ?HD as per Renal. K is now within normal limits.  ?Insulin for glucose control.  ?Protonix for GI prophylaxis.   ? ? ? ?Intervention Category ?Evaluation Type: New Patient Evaluation ? ?VElsie Lincoln?06/15/2021, 5:01 AM ?

## 2021-06-15 NOTE — TOC Progression Note (Signed)
Transition of Care (TOC) - Initial/Assessment Note  ? ? ?Patient Details  ?Name: [name] ?MRN: [id number] ?Date of Birth: Aug 13, 1965 ? ?Transition of Care (TOC) CM/SW Contact:    ?Paulene Floor Dierdra Salameh, LCSWA ?Phone Number: ?06/15/2021, 2:48 PM ? ?Clinical Narrative:                 ?CSW received consult for SNF placement.  The patient was ST at [facility] prior to admission and will need PT and OT evaluation along with insurance authorization prior to returning.  ? ?TOC will continue to follow.   ? ? ?Patient Goals and CMS Choice ?  ?  ?  ? ?Expected Discharge Plan and Services ?  ?  ?  ?  ?  ?                ?  ?  ?  ?  ?  ?  ?  ?  ?  ?  ? ?Prior Living Arrangements/Services ?  ?  ?  ?       ?  ?  ?  ?  ? ?Activities of Daily Living ?Home Assistive Devices/Equipment: Wheelchair ?ADL Screening (condition at time of admission) ?Patient's cognitive ability adequate to safely complete daily activities?: No ?Is the patient deaf or have difficulty hearing?: No ?Does the patient have difficulty seeing, even when wearing glasses/contacts?: No ?Does the patient have difficulty concentrating, remembering, or making decisions?: Yes ?Patient able to express need for assistance with ADLs?: Yes ?Does the patient have difficulty dressing or bathing?: Yes ?Independently performs ADLs?: No ?Communication: Independent ?Dressing (OT): Needs assistance ?Is this a change from baseline?: Pre-admission baseline ?Grooming: Needs assistance ?Is this a change from baseline?: Pre-admission baseline ?Feeding: Independent ?Bathing: Needs assistance ?Is this a change from baseline?: Pre-admission baseline ?Toileting: Needs assistance ?Is this a change from baseline?: Pre-admission baseline ?In/Out Bed: Needs assistance ?Is this a change from baseline?: Pre-admission baseline ?Walks in Home: Needs assistance ?Is this a change from baseline?: Pre-admission baseline ?Does the patient have difficulty walking or climbing stairs?: Yes ?Weakness  of Legs: Right ?Weakness of Arms/Hands: Both ? ?Permission Sought/Granted ?  ?  ?   ?   ?   ?   ? ?Emotional Assessment ?  ?  ?  ?  ?  ?  ? ?Admission diagnosis:  Acute encephalopathy [G93.40] ?Atrial fibrillation with RVR ([place]) [I48.91] ?Subdural bleeding ([place]) [I62.00] ?Opiate overdose, accidental or unintentional, initial encounter ([place]) [T40.601A] ?Patient Active Problem List  ? Diagnosis Date Noted  ? Acute encephalopathy 06/15/2021  ? Opiate overdose ([place])   ? Subdural bleeding ([place])   ? Atrial fibrillation with RVR ([place]) 06/05/2021  ? Asterixis 06/04/2021  ? Acute metabolic encephalopathy 40/98/1191  ? Bacteremia due to Enterobacter aerogenes 06/03/2021  ? Hypotension 06/03/2021  ? Sepsis ([place]) 05/26/2021  ? PVD (peripheral vascular disease) ([place])   ? Osteomyelitis ([place]) 05/25/2021  ? Acute respiratory failure with hypoxia ([place]) 04/26/2021  ? Hypertensive urgency   ? Acute pulmonary edema (HCC)   ? Obesity (BMI 30-39.9) 01/10/2021  ? Finger infection, left hand, second digit 01/10/2021  ? Pulmonary nodule seen on imaging study 01/10/2021  ? 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 Ventral hernia 01/10/2021  ? 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 Rheumatoid arthritis ([place]) 01/10/2021  ? Osteomyelitis of left hand ([place]) 01/10/2021  ? Possible osteomyelitis of finger of left hand ([place]) 01/09/2021  ? Abnormal CT scan, chest  11/27/2020  ? OSA (obstructive sleep apnea), concern for 11/27/2020  ? Thyroid nodule 04/10/2020  ? Renal mass, right 08/15/2019  ? Dyslipidemia 11/20/2018  ? Shortness of breath 10/23/2018  ? Morbid obesity ([place]) 10/23/2018  ? Coronary artery disease 10/23/2018  ? Tobacco abuse 10/23/2018  ? Unspecified protein-calorie malnutrition ([place]) 10/11/2018  ? End-stage renal disease on hemodialysis ([place]) 09/27/2018  ? ESRD (end stage renal disease) ([place]) 09/26/2018  ? Chronic pain disorder 09/26/2018  ? left foot wound with unreconstructable  vascular disease 09/18/2018  ? Iron deficiency anemia, unspecified 01/10/2018  ? Coagulation defect, unspecified ([place]) 12/06/2017  ? Itching 09/22/2017  ? Secondary hyperparathyroidism of renal origin ([place]) 09/01/2017  ? Disorder of phosphorus metabolism, unspecified 08/25/2017  ? Anemia in chronic kidney disease 08/24/2017  ? Left ventricular hypertrophy 06/17/2017  ? Diabetic retinopathy 05/21/2011  ? Diastolic dysfunction 00/71/2197  ? Hyperlipidemia associated with type 2 diabetes mellitus ([place]) 01/30/2011  ? Type 2 diabetes mellitus with diabetic chronic kidney disease ([place]) 01/29/2011  ? Hypertension associated with diabetes ([place])   ? ?PCP:  Sandi Mariscal, MD ?Pharmacy:  No Pharmacies Listed ? ? ? ?Social Determinants of Health (SDOH) Interventions ?  ? ?Readmission Risk Interventions ? ?  02/28/2020  ?  1:08 PM  ?Readmission Risk Prevention Plan  ?Transportation Screening Complete  ?PCP or Specialist Appt within 5-7 Days Complete  ?Home Care Screening Complete  ?Medication Review (RN CM) Complete  ? ? ? ?

## 2021-06-15 NOTE — H&P (Signed)
? ?NAME:  [name], MRN:  [id number], DOB:  01/25/1966, LOS: 0 ?ADMISSION DATE:  06/15/2021, CONSULTATION DATE:  5/15 ?REFERRING MD:  Cardama, CHIEF COMPLAINT:  AMS  ? ?History of Present Illness:  ?[name], is a 56 y.o. female, who presented to the [facility] ED with a chief complaint of AMS ? 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 ?Patient from [place]. Prior to arrival, reports of patient leaving facility and coming back altered. EMS called on 5/14 for AMS, responded to to narcan. ? ?ED course was notable for continued unresponsiveness.  Patient started on Narcan drip with response.  Head CT CT was obtained which was concerning for a small 4 mm subdural hemorrhage.  Neurosurgery was consulted.  Patient was previously on Eliquis and was reversed with Andexxa.  Patient was also found to be in A-fib with RVR and started on a diltiazem drip.  Afebrile.  No leukocytosis. ? ?PCCM was consulted for admission ? 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 ?Significant Hospital Events: ?Including procedures, antibiotic start and stop dates in addition to other pertinent events   ?5/15 presented to [facility] concerning for subdural.  Eliquis reversed.  PCCM consult ? ?Interim History / Subjective:  ?See above ? ?Subjective: Denies chest pain, denies shortness of breath, denies headache ? ?Objective   ?Blood pressure (!) 124/96, pulse (!) 108, temperature 98.1 ?F (36.7 ?C), temperature source Oral, resp. rate (!) 24, SpO2 98 %. ?   ?   ?No intake or output data in the 24 hours ending 06/15/21 0324 ?There were no vitals filed for this visit. ? ?Examination: ?General: In bed, NAD, appears comfortable ?HEENT: MM pink/moist, anicteric, atraumatic ?Neuro: RASS 0, PERRL 2 mm, GCS 15 ?CV: S1S2,  Afib, no m/r/g appreciated ?PULM:  clear in the upper lobes, diminished in the lower lobes, trachea midline, chest expansion symmetric ?GI: soft, bsx4 active, non-tender   ?Extremities: warm/dry, no pretibial edema, capillary refill less than 3 seconds, fistula with bruit and thrill ?Skin: see images below, L BKA with staples, Rt venous stasis ulcers ? ? ? ? ? ?Sodium 129, potassium 5.9, chloride 90 ?BUN 57, creatinine 6.5 ?Alk phos 150 ?Albumin 2.9 ?Bilirubin 1.6 ?Anion gap 16, CO2 23 ?Hemoglobin 9 ?Ethanol within normal limits ?INR 1.7  ?UA and UDS pending ?VBG 7.36/55/23/31 ? ?Head CT: Possible 4 mm subdural hemorrhage along the posterior falx per radiology ?Chest x-ray: No effusion, no pneumothorax, possible atelectasis at lung bases vs edema ?Twelve-lead: A-fib, IVCD. ? ?Resolved Hospital Problem list   ? ? ?Assessment & Plan:  ?Altered mental status, question secondary to opioid overdose versus subdural hemorrhage.  Patient afebrile with no significant leukocytosis, do not suspect infectious etiology. ??  Opioid overdose, responsive to Narcan, patient denies narcotic use.  Denies illicit drug use ?Possible 4 mm subdural hemorrhage as seen on CT head 5/15, unclear etiology no reports of head trauma, patient on anticoagulation ?-Neurosurgery consulted appreciate assistance.  Repeat head CT in 12 hours.  Awaiting recommendations. ?-Eliquis reversed in the ED with Andexxa ?-Holding anticoagulation in setting of head bleed ?-Admit to ICU with continuous SPO2 and telemetry ?-Continue neuroprotective measures- normothermia, euglycemia, HOB greater than 30, head in neutral alignment, normocapnia, normoxia.  ?-Continue  neurochecks ?-Narcan drip off.  If patient becomes more somnolent resume Narcan drip. ?-N.p.o. ? ?Afib with RVR on eliquis ?Eliquis reversed with Andexxa ?-hold all anticoagulation in setting of subdural hemorrhage. ?-Continue diltiazem drip ? ?Hyperkalemia ?Potassium 5.9. Status post Lokelma ?-Follow-up  potassium level ?-HD when appropriate ? ?Hyponatremia ?Sodium 129. Appears hyponatremic for past 7 days. ?-Monitor ?-No free water ? ?HX CAD ?HX CHF, last echo EF 60 to 65% on 06/02/2021 ?HX HLD ?HX HTN ?-Continue rosuvastatin when able to take p.o. ?-Hold home antihypertensives at this time ? ?ESRD on HD (MWF) ?Patient reports anuric ?-AM Renal consult ? ?Normocytic, anemia  ?suspect chronic. history of kidney disease ?-Transfuse PRBC if HBG less than 7 ?-Obtain AM CBC to trend H&H ?-Monitor for signs of bleeding ? ?DM2 ?-Blood Glucose goal 140-180. ?-SSI ? ?Left BKA ?Right venous stasis wound ?-Wound care consult ? ?Best Practice (right click and "Reselect all SmartList Selections" daily)  ? ?Diet/type: NPO ?DVT prophylaxis: SCD ?GI prophylaxis: PPI ?Lines: N/A ?Foley:  N/A ?Code Status:  full code ?Last date of multidisciplinary goals of care discussion [pending] ? ?Labs   ?CBC: ?Recent Labs  ?Lab 06/09/21 ?0102 06/10/21 ?0134 06/15/21 ?0036 06/15/21 ?1610 06/15/21 ?9604 06/15/21 ?0308  ?WBC 8.8 8.2 7.0  --   --   --   ?NEUTROABS 6.8 5.8 4.8  --   --   --   ?HGB 8.6* 8.4* 9.0* 9.9* 9.9* 9.2*  ?HCT 27.3* 27.2* 28.8* 29.0* 29.0* 27.0*  ?MCV 97.5 98.6 98.0  --   --   --   ?PLT 247 218 203  --   --   --   ? ? ?Basic Metabolic Panel: ?Recent Labs  ?Lab 06/09/21 ?0102 06/10/21 ?0134 06/15/21 ?0036 06/15/21 ?5409 06/15/21 ?8119 06/15/21 ?0308  ?NA 132* 131* 129* 125* 129* 129*  ?K 4.4 4.5 5.9* >8.5* 4.7 4.7  ?CL 94* 94* 90*  --  91*  --   ?CO2 _0 --   --   --   ?GLUCOSE 189* 145* 77  --  81  --   ?BUN 34* 48* 57*  --  63*  --   ?CREATININE 4.96* 6.58* 6.50*  --  7.60*  --   ?CALCIUM 8.6* 8.6* 8.2*  --   --   --   ?MG 2.0 2.0  --   --   --   --   ?PHOS 3.2 3.9  --   --   --   --   ? ?GFR: ?Estimated Creatinine Clearance: 11 mL/min (A) (by C-G formula based on SCr of 7.6 mg/dL (H)). ?Recent Labs  ?Lab 06/09/21 ?0102 06/10/21 ?0134 06/15/21 ?0036  ?WBC 8.8 8.2 7.0  ? ? ?Liver Function Tests: ?Recent Labs  ?Lab  06/09/21 ?0102 06/10/21 ?0134 06/15/21 ?0036  ?AST 44* 29 38  ?ALT 37 26 29  ?ALKPHOS 191* 179* 150*  ?BILITOT 0.7 0.5 1.6*  ?PROT 7.7 7.7 7.4  ?ALBUMIN 2.6* 2.7* 2.9*  ? ?No results for input(s): LIPASE, AMYLASE in the last 168 hours. ?No results for input(s): AMMONIA in the last 168 hours. ? ?ABG ?   ?Component Value Date/Time  ? PHART 7.284 (L) 11/25/2020 1254  ? PCO2ART 63.2 (H) 11/25/2020 1254  ? PO2ART 41 (L) 11/25/2020 1254  ? HCO3 29.6 (H) 06/15/2021 0308  ? TCO2 31 06/15/2021 0308  ? O2SAT 55 06/15/2021 0308  ?  ? ?Coagulation Profile: ?Recent Labs  ?Lab 06/15/21 ?0036  ?INR 1.7*  ? ? ?Cardiac Enzymes: ?  No results for input(s): CKTOTAL, CKMB, CKMBINDEX, TROPONINI in the last 168 hours. ? ?HbA1C: ?Hemoglobin A1C  ?Date/Time Value Ref Range Status  ?05/01/2020 08:28 AM 9.7 (A) 4.0 - 5.6 % Final  ? ?Hgb A1c MFr Bld  ?Date/Time Value Ref Range Status  ?04/26/2021 02:22 PM 11.3 (H) 4.8 - 5.6 % Final  ?  Comment:  ?  (NOTE) ?Pre diabetes:          5.7%-6.4% ? ?Diabetes:              >6.4% ? ?Glycemic control for   <7.0% ?adults with diabetes ?  ?02/18/2020 07:06 PM 8.5 (H) 4.8 - 5.6 % Final  ?  Comment:  ?  (NOTE) ?Pre diabetes:          5.7%-6.4% ? ?Diabetes:              >6.4% ? ?Glycemic control for   <7.0% ?adults with diabetes ?  ? ? ?CBG: ?Recent Labs  ?Lab 06/10/21 ?0604 06/10/21 ?1150 06/10/21 ?1638 06/10/21 ?2109 06/11/21 ?2197  ?GLUCAP 121* 130* 283* 233* 139*  ? ? ?Review of Systems:   ?Positives in bold ? ?Gen: fever, chills, weight change, fatigue, night sweats ?HEENT:  blurred vision, double vision, hearing loss, tinnitus, sinus congestion, rhinorrhea, sore throat, neck stiffness, dysphagia ?PULM:  shortness of breath, cough, sputum production, hemoptysis, wheezing ?CV: chest pain, edema, orthopnea, paroxysmal nocturnal dyspnea, palpitations ?GI:  abdominal pain, nausea, vomiting, diarrhea, hematochezia, melena, constipation, change in bowel habits ?GU: dysuria, hematuria, polyuria, oliguria,  urethral discharge ?Endocrine: hot or cold intolerance, polyuria, polyphagia or appetite change ?Derm: rash, dry skin, scaling or peeling skin change ?Heme: easy bruising, bleeding, bleeding gums ?Neuro: headach

## 2021-06-16 LAB — BASIC METABOLIC PANEL
Anion gap: 17 — ABNORMAL HIGH (ref 5–15)
BUN: 74 mg/dL — ABNORMAL HIGH (ref 6–20)
CO2: 23 mmol/L (ref 22–32)
Calcium: 8.1 mg/dL — ABNORMAL LOW (ref 8.9–10.3)
Chloride: 86 mmol/L — ABNORMAL LOW (ref 98–111)
Creatinine, Ser: 8.09 mg/dL — ABNORMAL HIGH (ref 0.44–1.00)
GFR, Estimated: 5 mL/min — ABNORMAL LOW (ref >60)
Glucose, Bld: 167 mg/dL — ABNORMAL HIGH (ref 70–99)
Potassium: 5.3 mmol/L — ABNORMAL HIGH (ref 3.5–5.1)
Sodium: 126 mmol/L — ABNORMAL LOW (ref 135–145)

## 2021-06-16 LAB — CBC
HCT: 26.8 % — ABNORMAL LOW (ref 36.0–46.0)
Hemoglobin: 8.4 g/dL — ABNORMAL LOW (ref 12.0–15.0)
MCH: 30.4 pg (ref 26.0–34.0)
MCHC: 31.3 g/dL (ref 30.0–36.0)
MCV: 97.1 fL (ref 80.0–100.0)
Platelets: 196 10*3/uL (ref 150–400)
RBC: 2.76 MIL/uL — ABNORMAL LOW (ref 3.87–5.11)
RDW: 16.6 % — ABNORMAL HIGH (ref 11.5–15.5)
WBC: 7.6 10*3/uL (ref 4.0–10.5)
nRBC: 0.7 % — ABNORMAL HIGH (ref 0.0–0.2)

## 2021-06-16 LAB — GLUCOSE, CAPILLARY
Glucose-Capillary: 135 mg/dL — ABNORMAL HIGH (ref 70–99)
Glucose-Capillary: 140 mg/dL — ABNORMAL HIGH (ref 70–99)
Glucose-Capillary: 161 mg/dL — ABNORMAL HIGH (ref 70–99)
Glucose-Capillary: 192 mg/dL — ABNORMAL HIGH (ref 70–99)
Glucose-Capillary: 178 mg/dL — ABNORMAL HIGH (ref 70–99)
Glucose-Capillary: 191 mg/dL — ABNORMAL HIGH (ref 70–99)

## 2021-06-16 LAB — CORTISOL: Cortisol, Plasma: 19.4 ug/dL

## 2021-06-16 LAB — TSH: TSH: 2.742 u[IU]/mL (ref 0.350–4.500)

## 2021-06-16 MED ORDER — MIDODRINE HCL 5 MG PO TABS
10.0000 mg | ORAL_TABLET | Freq: Three times a day (TID) | ORAL | Status: DC
  Administered 2021-06-16 – 2021-06-18 (×8): 10 mg via ORAL
  Filled 2021-06-16 (×8): qty 2

## 2021-06-16 MED ORDER — SODIUM CHLORIDE 0.9 % IV SOLN: INTRAVENOUS | Status: DC | PRN

## 2021-06-16 MED ORDER — HYDROXYZINE HCL 25 MG PO TABS
25.0000 mg | ORAL_TABLET | Freq: Every morning | ORAL | Status: DC
Start: 2021-06-16 — End: 2021-06-16

## 2021-06-16 MED ORDER — NOREPINEPHRINE 4 MG/250ML-% IV SOLN
INTRAVENOUS | Status: AC
  Filled 2021-06-16: qty 250

## 2021-06-16 MED ORDER — ROSUVASTATIN CALCIUM 20 MG PO TABS: 40.0000 mg | ORAL_TABLET | Freq: Every day | ORAL | Status: DC

## 2021-06-16 MED ORDER — PANTOPRAZOLE SODIUM 40 MG PO TBEC
40.0000 mg | DELAYED_RELEASE_TABLET | Freq: Every day | ORAL | Status: DC
  Administered 2021-06-16 – 2021-06-17 (×2): 40 mg via ORAL
  Filled 2021-06-16 (×2): qty 1

## 2021-06-16 MED ORDER — HYDROXYZINE HCL 25 MG PO TABS: 25.0000 mg | ORAL_TABLET | Freq: Every day | ORAL | Status: DC | PRN

## 2021-06-16 MED ORDER — NOREPINEPHRINE 4 MG/250ML-% IV SOLN
0.0000 ug/min | INTRAVENOUS | Status: DC
  Administered 2021-06-16: 2 ug/min via INTRAVENOUS

## 2021-06-16 MED ORDER — HEPARIN SODIUM (PORCINE) 1000 UNIT/ML DIALYSIS
5000.0000 [IU] | INTRAMUSCULAR | Status: AC | PRN
  Administered 2021-06-16 (×2): 5000 [IU] via INTRAVENOUS_CENTRAL
  Filled 2021-06-16: qty 5

## 2021-06-16 MED ORDER — ROSUVASTATIN CALCIUM 5 MG PO TABS
10.0000 mg | ORAL_TABLET | Freq: Every day | ORAL | Status: DC
  Administered 2021-06-16 – 2021-06-17 (×2): 10 mg via ORAL
  Filled 2021-06-16 (×2): qty 2

## 2021-06-16 MED ORDER — HEPARIN SODIUM (PORCINE) 5000 UNIT/ML IJ SOLN
5000.0000 [IU] | Freq: Three times a day (TID) | INTRAMUSCULAR | Status: DC
  Administered 2021-06-16 – 2021-06-18 (×4): 5000 [IU] via SUBCUTANEOUS
  Filled 2021-06-16 (×5): qty 1

## 2021-06-16 NOTE — Progress Notes (Signed)
Patient ID: [name], female   DOB: Jun 08, 1965, 56 y.o.   MRN: [id number] ? ? ?Follow up CT head reviewed. The hyperdensity that was visualized on the initial CT appears unchanged on the follow up CT. There is no acute hemorrhage appreciated. This appears to be chronic and favor a venous structure. No additional neurosurgical recommendations. Can restart Eliquis on 06/18/2021.  Please call with any questions.  ? ? ?Marvis Moeller, DNP, NP-C ?06/16/2021 1:41 PM ?Neurosurgery ?

## 2021-06-16 NOTE — Progress Notes (Signed)
[facility] ?Progress Note ? ?Subjective: seen in ICU, BP's soft in 85-90 range, on HD just started. Pt groggy and ^wob  ? ?Vitals:  ? 06/16/21 0800 06/16/21 0815 06/16/21 0830 06/16/21 0845  ?BP: 115/66 100/66 (!) 105/58 (!) 112/54  ?Pulse: 67 85 87 86  ?Resp: '19 19 18 18  '$ ?Temp:      ?TempSrc:      ?SpO2: (!) 87% (!) 86% 92% 95%  ?Weight:      ?Height:      ? ? ?Exam: ?Gen follows simple commands, lethargic ?No jvd or bruits ?Chest bilat basilar rales, ^wob ?RRR no RG ?Abd soft ntnd no mass or ascites +bs ?MS L AKA healing well, R lower leg superficial skin wounds ?Ext 1+ bilat hip/ dependent edema ?Neuro as above ?   RUA AVF+bruit ? ?  ? Home meds include (unreviewed) - eliquis, auryxia 4 ac, plavix, humira q 2wk, humalog 75/25 35u bid, lopressor 12.5 bid, midodrine '10mg'$  prn low bp on hd, percocet prn, home O2 2L, mirapex, crestor, prns / vits/ supps ?  ?  ?  ? OP HD: MWF GO ? 4h  450/800  110.6kg    2/2.5 bath  RUA AVF  Heparin 10000 bolus ?- last HD 5/12 came off 5.3kg up ?- last Hb 8.6 on 5/12 ?- rocaltrol 2.5ug po tiw ?- parsabiv 7.'5mg'$  IV tiw ?  ?Assessment/ Plan: ?AMS - opioid use vs SDH, a little better today ?ESRD - on HD MWF. HD postponed to today due to high census/ staffing issues. On HD now. Will order HD for tomorrow as well.  ?Vol ^/ pulm edema - by CXR.  Up 8-9kg by wts. Max UF w/ HD today. Will likely need pressor support.  ?Afib / RVR - holding a/c, on IV diltiazem gtt.  ?Hyperkalemia - mild ?Anemia ckd - Hb 8.6 at OP unit but not getting esa due to concern for possible malignancy ?BMD ckd - CCa and phos in range, cont binder and po vdra while here ?  ? ? ?Rob Doctor, hospital ?06/16/2021, 9:12 AM ? ? ?Recent Labs  ?Lab 06/10/21 ?0134 06/15/21 ?0036 06/15/21 ?7824 06/15/21 ?0308 06/15/21 ?0510 06/16/21 ?0436  ?HGB 8.4* 9.0*   < > 9.2*  --  8.4*  ?ALBUMIN 2.7* 2.9*  --   --   --   --   ?CALCIUM 8.6* 8.2*  --   --  8.4* 8.1*  ?PHOS 3.9  --   --   --   --   --   ?CREATININE 6.58* 6.50*   < >  --   6.98* 8.09*  ?K 4.5 5.9*   < > 4.7 4.9 5.3*  ? < > = values in this interval not displayed.  ? ?Inpatient medications: ? calcitRIOL  2.5 mcg Oral Q M,W,F-HD  ? Chlorhexidine Gluconate Cloth  6 each Topical Q0600  ? Chlorhexidine Gluconate Cloth  6 each Topical Q0600  ? insulin aspart  0-15 Units Subcutaneous TID WC  ? insulin aspart  0-5 Units Subcutaneous QHS  ? mouth rinse  15 mL Mouth Rinse BID  ? midodrine  10 mg Oral Q8H  ? pantoprazole  40 mg Oral QHS  ? pramipexole  0.5 mg Oral TID  ? rosuvastatin  40 mg Oral QHS  ? ? sodium chloride 10 mL/hr at 06/16/21 0803  ? norepinephrine (LEVOPHED) Adult infusion 2 mcg/min (06/16/21 0802)  ? ?sodium chloride, docusate sodium, ondansetron (ZOFRAN) IV, oxyCODONE-acetaminophen **AND** oxyCODONE, polyethylene glycol ? ? ? ? ? ? ?

## 2021-06-16 NOTE — Progress Notes (Signed)
? ?NAME:  [name], MRN:  [id number], DOB:  06/27/1965, LOS: 1 ?ADMISSION DATE:  06/15/2021, CONSULTATION DATE:  5/15 ?REFERRING MD:  Cardama, CHIEF COMPLAINT:  AMS  ? ?History of Present Illness:  ?[name], is a 56 y.o. female, who presented to the [facility] ED with a chief complaint of AMS ? 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 ?Patient from [place]. Prior to arrival, reports of patient leaving facility and coming back altered. EMS called on 5/14 for AMS, responded to to narcan. ? ?ED course was notable for continued unresponsiveness.  Patient started on Narcan drip with response.  Head CT CT was obtained which was concerning for a small 4 mm subdural hemorrhage.  Neurosurgery was consulted.  Patient was previously on Eliquis and was reversed with Andexxa.  Patient was also found to be in A-fib with RVR and started on a diltiazem drip.  Afebrile.  No leukocytosis. ? ?PCCM was consulted for admission ? 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 ?Significant Hospital Events: ?Including procedures, antibiotic start and stop dates in addition to other pertinent events   ?5/15 presented to [facility] concerning for subdural.  Eliquis reversed.  PCCM consult.  She was briefly on Narcan drip ?5/16: hypotensive during HD started on low dose levo; midodrine started ? ?Interim History / Subjective:  ? ? ?More awake today.  Continues on Cardizem drip ? ?Objective   ?Blood pressure (!) 105/58, pulse 87, temperature 97.6 ?F (36.4 ?C), temperature source Oral, resp. rate 18, height '5\' 8"'$  (1.727 m), weight 119.1 kg, SpO2 92 %. ?   ?   ? ?Intake/Output Summary (Last 24 hours) at 06/16/2021 0849 ?Last data filed at 06/16/2021 0400 ?Gross per 24 hour  ?Intake 324.59 ml  ?Output --  ?Net 324.59  ml  ? ? ?Filed Weights  ? 06/15/21 0458  ?Weight: 119.1 kg  ? ? ?Examination: ?General: NAD ?HEENT: MM pink/moist; Coggon in place ?Neuro: mildly lethargic but arouses to command; Aox3; MAE ?CV: s1s2, no m/r/g ?PULM:  dim clear BS bilaterally; [place] 3L ?GI: soft, bsx4 active  ?Extremities: warm/dry, L BKA; RLE ulcer with dressing in place; L hand digital amputation ? ?Resolved Hospital Problem list   ? ? ?Assessment & Plan:  ?Altered mental status, question secondary to opioid overdose versus subdural hemorrhage.  Patient afebrile with no significant leukocytosis, do not suspect infectious etiology. ??  Opioid overdose, responsive to Narcan, patient denies narcotic use.  Denies illicit drug use ?Possible 4 mm subdural hemorrhage as seen on CT head 5/15, unclear etiology no reports of head trauma, patient on anticoagulation ?P: ?-Neurosurg following; appreciate recs ?-eliquis was reversed with Andexxa ?-per Neurosurg CT head on 5/15 with no acute changes; consider resuming Eliquis in 3 days ?-frequent neuro checks; limit sedating meds ?-hold home oxy ? ?Afib with RVR on eliquis ?P: ?-eliquis reversed w/ andexxa due to SDH ?-cardizem drip stopped 5/15; hold metoprolol in setting of hypotension ?-telemetry monitoring ?-SCD's for DVT ppx; consider subcu heparin ? ?Hypotension: low BP during HD requiring levo ?P: ?-wean levo for map goal >60 ?-start midodrine ?-hold anti-hypertensives ? ?Hyperkalemia > improved ?End-stage renal disease ?P: ?-nephro following ?-cont HD ?-Trend BMP / urinary output ?-Replace electrolytes as indicated ?-  Avoid nephrotoxic agents, ensure adequate renal perfusion ? ?Hyponatremia ?Sodium 129. Appears hyponatremic for past 7 days. ?P: ?-check TSH and cortisol ?-cont HD with volume removal ?-trend bmp ? ?HX CAD ?HX CHF, last echo EF 60 to 65% on 06/02/2021 ?HX HLD ?HX HTN ?P: ?-cont statin ?-hold metoprolol in setting of hypotension ?-consider restarting home plavix ? ?Normocytic, anemia  ?suspect  chronic. history of kidney disease ?P: ?-trend CBC ? ?DM2 ?P: ?-cont SSI TID w/ meals  ?-continue daily insulin ?-cbg monitoring ? ?Left BKA ?Right venous stasis wound ?P: ?-Wound care following ? ?Hx of anxiety ?P: ?-cont home hydroxyzine ? ?Best Practice (right click and "Reselect all SmartList Selections" daily)  ? ?Diet/type: Regular consistency (see orders) ?DVT prophylaxis: SCD ?GI prophylaxis: PPI ?Lines: N/A ?Foley:  N/A ?Code Status:  full code ?Last date of multidisciplinary goals of care discussion [5/16 spoke with patient at bedside and updated] ? ?Critical care time: 35 minutes  ? ? ?Mikki Harbor, PA-C ?[facility] ?06/16/2021, 9:09 AM ? ?Please see [email or web address] for pager details. ? ?From 7A-7P if no response, please call [phone]. ?After hours, please call [facility] [phone]. ? ? ? ?

## 2021-06-16 NOTE — Progress Notes (Signed)
.  removed 3552ms net fluid.  pt given midodrine for bp support and started on small dose levophed. Gave 5000 units of heparin bolus pre and md tx.  pre bp 80 systolic.  pre bp 803/79post bp 139/96 unable to obtain any weights on patient bed scales broken.  gave calcitriol and heparin 10000 units as ordered.  2 bandages ot rua avf some post tx  bleeding dressing cdi. ?

## 2021-06-17 DIAGNOSIS — I4891 Unspecified atrial fibrillation: Secondary | ICD-10-CM

## 2021-06-17 LAB — GLUCOSE, CAPILLARY
Glucose-Capillary: 173 mg/dL — ABNORMAL HIGH (ref 70–99)
Glucose-Capillary: 162 mg/dL — ABNORMAL HIGH (ref 70–99)
Glucose-Capillary: 155 mg/dL — ABNORMAL HIGH (ref 70–99)
Glucose-Capillary: 121 mg/dL — ABNORMAL HIGH (ref 70–99)
Glucose-Capillary: 139 mg/dL — ABNORMAL HIGH (ref 70–99)

## 2021-06-17 MED ORDER — OXYCODONE-ACETAMINOPHEN 5-325 MG PO TABS
ORAL_TABLET | ORAL | Status: AC
  Filled 2021-06-17: qty 1

## 2021-06-17 MED ORDER — ACETAMINOPHEN 325 MG PO TABS
ORAL_TABLET | ORAL | Status: AC
  Filled 2021-06-17: qty 2

## 2021-06-17 MED ORDER — ACETAMINOPHEN 325 MG PO TABS
650.0000 mg | ORAL_TABLET | Freq: Four times a day (QID) | ORAL | Status: DC | PRN
  Administered 2021-06-17: 650 mg via ORAL

## 2021-06-17 MED ORDER — HEPARIN SODIUM (PORCINE) 1000 UNIT/ML IJ SOLN
INTRAMUSCULAR | Status: AC
  Administered 2021-06-17: 5000 [IU]
  Filled 2021-06-17: qty 10

## 2021-06-17 NOTE — TOC Initial Note (Signed)
Transition of Care (TOC) - Initial/Assessment Note  ? ? ?Patient Details  ?Name: [name] ?MRN: [id number] ?Date of Birth: 1966-01-15 ? ?Transition of Care (TOC) CM/SW Contact:    ?Benard Halsted, LCSW ?Phone Number: ?06/17/2021, 3:56 PM ? ?Clinical Narrative:                 ?CSW spoke with patient's son and confirmed plan for patient to return to rehab at [facility] upon discharge. CSW initiated insurance review process, Ref# [id number]. ? ?Expected Discharge Plan: [place] ?Barriers to Discharge: Ship broker, Continued Medical Work up ? ? ?Patient Goals and CMS Choice ?Patient states their goals for this hospitalization and ongoing recovery are:: Return to snf ?CMS Medicare.gov Compare Post Acute Care list provided to:: Patient Represenative (must comment) ?Choice offered to / list presented to : Adult Children ? ?Expected Discharge Plan and Services ?Expected Discharge Plan: [place] ?In-house Referral: Clinical Social Work ?  ?Post Acute Care Choice: [place] ?Living arrangements for the past 2 months: [place], [place] ?                ?  ?  ?  ?  ?  ?  ?  ?  ?  ?  ? ?Prior Living Arrangements/Services ?Living arrangements for the past 2 months: [place], [place] ?Lives with:: Facility Resident, Adult Children ?Patient language and need for interpreter reviewed:: Yes ?Do you feel safe going back to the place where you live?: Yes      ?Need for Family Participation in Patient Care: Yes (Comment) ?Care giver support system in place?: Yes (comment) ?  ?Criminal Activity/Legal Involvement Pertinent to Current Situation/Hospitalization: No - Comment as needed ? ?Activities of Daily Living ?Home Assistive Devices/Equipment: Wheelchair ?ADL Screening (condition at time of admission) ?Patient's cognitive ability adequate to safely complete daily activities?: No ?Is the patient deaf or have difficulty  hearing?: No ?Does the patient have difficulty seeing, even when wearing glasses/contacts?: No ?Does the patient have difficulty concentrating, remembering, or making decisions?: Yes ?Patient able to express need for assistance with ADLs?: Yes ?Does the patient have difficulty dressing or bathing?: Yes ?Independently performs ADLs?: No ?Communication: Independent ?Dressing (OT): Needs assistance ?Is this a change from baseline?: Pre-admission baseline ?Grooming: Needs assistance ?Is this a change from baseline?: Pre-admission baseline ?Feeding: Independent ?Bathing: Needs assistance ?Is this a change from baseline?: Pre-admission baseline ?Toileting: Needs assistance ?Is this a change from baseline?: Pre-admission baseline ?In/Out Bed: Needs assistance ?Is this a change from baseline?: Pre-admission baseline ?Walks in Home: Needs assistance ?Is this a change from baseline?: Pre-admission baseline ?Does the patient have difficulty walking or climbing stairs?: Yes ?Weakness of Legs: Right ?Weakness of Arms/Hands: Both ? ?Permission Sought/Granted ?Permission sought to share information with : Facility Sport and exercise psychologist, Family Supports ?Permission granted to share information with : No ? Share Information with NAME: Vonna Kotyk ? Permission granted to share info w AGENCY: [facility] ? Permission granted to share info w Relationship: Son ? Permission granted to share info w Contact Information: [phone] ? ?Emotional Assessment ?Appearance:: Appears stated age ?Attitude/Demeanor/Rapport: Unable to Assess ?Affect (typically observed): Unable to Assess ?Orientation: : Oriented to Self, Oriented to Place ?Alcohol / Substance Use: Not Applicable ?Psych Involvement: No (comment) ? ?Admission diagnosis:  Acute encephalopathy [G93.40] ?Atrial fibrillation with RVR ([place]) [I48.91] ?Subdural bleeding ([place]) [I62.00] ?Opiate overdose, accidental or unintentional, initial encounter ([place]) [T40.601A] ?Patient Active Problem List  ?  Diagnosis  Date Noted  ? Acute encephalopathy 06/15/2021  ? Opiate overdose ([place])   ? Subdural bleeding ([place])   ? Atrial fibrillation with RVR ([place]) 06/05/2021  ? Asterixis 06/04/2021  ? Acute metabolic encephalopathy 34/19/6222  ? Bacteremia due to Enterobacter aerogenes 06/03/2021  ? Hypotension 06/03/2021  ? Sepsis ([place]) 05/26/2021  ? PVD (peripheral vascular disease) ([place])   ? Osteomyelitis ([place]) 05/25/2021  ? Acute respiratory failure with hypoxia ([place]) 04/26/2021  ? Hypertensive urgency   ? Acute pulmonary edema (HCC)   ? Obesity (BMI 30-39.9) 01/10/2021  ? Finger infection, left hand, second digit 01/10/2021  ? Pulmonary nodule seen on imaging study 01/10/2021  ? 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 Ventral hernia 01/10/2021  ? 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 Rheumatoid arthritis ([place]) 01/10/2021  ? Osteomyelitis of left hand ([place]) 01/10/2021  ? Possible osteomyelitis of finger of left hand ([place]) 01/09/2021  ? Abnormal CT scan, chest 11/27/2020  ? OSA (obstructive sleep apnea), concern for 11/27/2020  ? Thyroid nodule 04/10/2020  ? Renal mass, right 08/15/2019  ? Dyslipidemia 11/20/2018  ? Shortness of breath 10/23/2018  ? Morbid obesity ([place]) 10/23/2018  ? Coronary artery disease 10/23/2018  ? Tobacco abuse 10/23/2018  ? Unspecified protein-calorie malnutrition ([place]) 10/11/2018  ? End-stage renal disease on hemodialysis ([place]) 09/27/2018  ? ESRD (end stage renal disease) ([place]) 09/26/2018  ? Chronic pain disorder 09/26/2018  ? left foot wound with unreconstructable vascular disease 09/18/2018  ? Iron deficiency anemia, unspecified 01/10/2018  ? Coagulation defect, unspecified ([place]) 12/06/2017  ? Itching 09/22/2017  ? Secondary hyperparathyroidism of renal origin ([place]) 09/01/2017  ? Disorder of phosphorus metabolism, unspecified 08/25/2017  ? Anemia in chronic kidney disease 08/24/2017  ? Left ventricular hypertrophy 06/17/2017  ? Diabetic  retinopathy 05/21/2011  ? Diastolic dysfunction 97/98/9211  ? Hyperlipidemia associated with type 2 diabetes mellitus ([place]) 01/30/2011  ? Type 2 diabetes mellitus with diabetic chronic kidney disease ([place]) 01/29/2011  ? Hypertension associated with diabetes ([place])   ? ?PCP:  Sandi Mariscal, MD ?Pharmacy:  No Pharmacies Listed ? ? ? ?Social Determinants of Health (SDOH) Interventions ?  ? ?Readmission Risk Interventions ? ?  06/16/2021  ?  2:19 PM 02/28/2020  ?  1:08 PM  ?Readmission Risk Prevention Plan  ?Transportation Screening Complete Complete  ?PCP or Specialist Appt within 5-7 Days  Complete  ?Home Care Screening  Complete  ?Medication Review (RN CM)  Complete  ?Medication Review Press photographer) Referral to Pharmacy   ?PCP or Specialist appointment within 3-5 days of discharge Complete   ?[place] or Home Care Consult Complete   ?SW Recovery Care/Counseling Consult Complete   ?Palliative Care Screening Not Applicable   ?Skilled Nursing Facility Complete   ? ? ? ?

## 2021-06-17 NOTE — Progress Notes (Signed)
?PROGRESS NOTE ? ? ? ?[name]  NIO:[id number] DOB: 08/06/1965 DOA: 06/15/2021 ?PCP: Sandi Mariscal, MD ? ? ? ?Chief Complaint  ?Patient presents with  ? Altered Mental Status  ? ? ?Brief Narrative:  ? ?[name], is a 56 y.o. female, who presented to the [facility] ED with a chief complaint of AMS ?  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  ?Patient from [facility]. Prior to arrival, reports of patient leaving facility and coming back altered. EMS called on 5/14 for AMS, responded to to narcan. ?  ?ED course was notable for continued unresponsiveness.  Patient started on Narcan drip with response.  Head CT CT was obtained which was concerning for a small 4 mm subdural hemorrhage.  Neurosurgery was consulted.  Patient was previously on Eliquis and was reversed with Andexxa.  Patient was also found to be in A-fib with RVR and started on a diltiazem drip.  Afebrile.  No leukocytosis. ?-Physician at [facility], discharged on 5/11, for bacteremia antiplatelet care limb ischemia status post left BKA 5/2 ?  ? ?Assessment & Plan: ?  ?Active Problems: ?  Acute encephalopathy ? ? ?Altered mental status ?Questionable opioid overdose ?- question secondary to opioid overdose versus subdural hemorrhage.  Patient afebrile with no significant leukocytosis, do not suspect infectious etiology. ?-responsive to Narcan, patient denies narcotic use.  Denies illicit drug use ? ?Possible 4 mm subdural hemorrhage as seen on CT head 5/15, unclear etiology no reports of head trauma, patient on anticoagulation ?-Neurosurgery input greatly appreciated, findings appear to be chronic and favored a venous structure, no additional neurosurgical recommendation, can restart Eliquis on 06/18/2021.. ?-eliquis was reversed with Andexxa ?-hold home oxy ?  ?Afib with RVR on eliquis ?-eliquis reversed w/ andexxa due to SDH ?-cardizem drip  stopped 5/15; hold metoprolol in setting of hypotension ?-telemetry monitoring ?-Resume Eliquis tomorrow ?  ?Hypotension: low BP during HD requiring levo ?P: ?-wean levo for map goal >60 ?-start midodrine ?-hold anti-hypertensives ?  ?Hyperkalemia > improved ?End-stage renal disease ?P: ?-nephro following ?-cont HD ?-Trend BMP / urinary output ?-Replace electrolytes as indicated ?-Avoid nephrotoxic agents, ensure adequate renal perfusion ?  ?Hyponatremia ?Sodium 129. Appears hyponatremic for past 7 days. ?P: ?-check TSH and cortisol ?-cont HD with volume removal ?-trend bmp ?  ?HX CAD ?HX CHF, last echo EF 60 to 65% on 06/02/2021 ?HX HLD ?HX HTN ?P: ?-cont statin ?-hold metoprolol in setting of hypotension ?-consider restarting home plavix ?  ?Normocytic, anemia  ?suspect chronic. history of kidney disease ?P: ?-trend CBC ?  ?DM2 ?P: ?-cont SSI TID w/ meals  ?-continue daily insulin ?-cbg monitoring ?  ?Left BKA ?Right venous stasis wound ?P: ?-Wound care following ?  ?Hx of anxiety ?P: ?-cont home hydroxyzine ? ? ?DVT prophylaxis: SCD>> will resume Eliquis tomorrow. ?Code Status: Full ?Family Communication: none at bedside ?Disposition:  ? ?Status is: Inpatient ? ?  ?Consultants:  ?- renal ?- Neurosurgery ? ?Subjective: ? ?No significant events overnight as discussed with staff, she denies any complaints today. ? ?Objective: ?Vitals:  ? 06/16/21 2325 06/17/21 0140 06/17/21 0340 06/17/21 0817  ?BP: 97/80  129/66 136/76  ?Pulse: 100  95 68  ?Resp: '20  18 20  '$ ?Temp: 98.7 ?F (37.1 ?C)  97.8 ?F (36.6 ?C) 98 ?F (36.7 ?C)  ?TempSrc: Oral  Oral Oral  ?SpO2: 94%     ?  Weight:  120.6 kg    ?Height:      ? ? ?Intake/Output Summary (Last 24 hours) at 06/17/2021 1002 ?Last data filed at 06/16/2021 1558 ?Gross per 24 hour  ?Intake 480 ml  ?Output 3500 ml  ?Net -3020 ml  ? ?Filed Weights  ? 06/15/21 0458 06/17/21 0140  ?Weight: 119.1 kg 120.6 kg  ? ? ?Examination: ? ?Awake Alert, Oriented X 3, No new F.N deficits, Normal  affect ?Symmetrical Chest wall movement, Good air movement bilaterally, CTAB ?RRR,No Gallops,Rubs or new Murmurs, No Parasternal Heave ?+ve B.Sounds, Abd Soft, No tenderness, No rebound - guarding or rigidity. ?No Cyanosis, Clubbing or edema, left BKA, staples present, wound bandaged. ? ? ? ?Data Reviewed: I have personally reviewed following labs and imaging studies ? ?CBC: ?Recent Labs  ?Lab 06/15/21 ?0036 06/15/21 ?8786 06/15/21 ?7672 06/15/21 ?0308 06/16/21 ?0947  ?WBC 7.0  --   --   --  7.6  ?NEUTROABS 4.8  --   --   --   --   ?HGB 9.0* 9.9* 9.9* 9.2* 8.4*  ?HCT 28.8* 29.0* 29.0* 27.0* 26.8*  ?MCV 98.0  --   --   --  97.1  ?PLT 203  --   --   --  196  ? ? ?Basic Metabolic Panel: ?Recent Labs  ?Lab 06/15/21 ?0036 06/15/21 ?0962 06/15/21 ?0307 06/15/21 ?0308 06/15/21 ?0510 06/16/21 ?0436  ?NA 129* 125* 129* 129* 131* 126*  ?K 5.9* >8.5* 4.7 4.7 4.9 5.3*  ?CL 90*  --  91*  --  91* 86*  ?CO2 23  --   --   --  23 23  ?GLUCOSE 77  --  81  --  73 167*  ?BUN 57*  --  63*  --  59* 74*  ?CREATININE 6.50*  --  7.60*  --  6.98* 8.09*  ?CALCIUM 8.2*  --   --   --  8.4* 8.1*  ? ? ?GFR: ?Estimated Creatinine Clearance: 10.6 mL/min (A) (by C-G formula based on SCr of 8.09 mg/dL (H)). ? ?Liver Function Tests: ?Recent Labs  ?Lab 06/15/21 ?0036  ?AST 38  ?ALT 29  ?ALKPHOS 150*  ?BILITOT 1.6*  ?PROT 7.4  ?ALBUMIN 2.9*  ? ? ?CBG: ?Recent Labs  ?Lab 06/16/21 ?1938 06/16/21 ?2206 06/16/21 ?2322 06/17/21 ?0343 06/17/21 ?0840  ?GLUCAP 192* 178* 191* 173* 162*  ? ? ? ?Recent Results (from the past 240 hour(s))  ?MRSA Next Gen by PCR, Nasal     Status: None  ? Collection Time: 06/15/21  4:51 AM  ? Specimen: Nasal Mucosa; Nasal Swab  ?Result Value Ref Range Status  ? MRSA by PCR Next Gen NOT DETECTED NOT DETECTED Final  ?  Comment: (NOTE) ?The GeneXpert MRSA Assay (FDA approved for NASAL specimens only), ?is one component of a comprehensive MRSA colonization surveillance ?program. It is not intended to diagnose MRSA infection nor to  guide ?or monitor treatment for MRSA infections. ?Test performance is not FDA approved in patients less than 2 years ?old. ?Performed at [facility], [place] [address] [place], [place] ?[address] ?  ?  ? ? ? ? ? ?Radiology Studies: ?CT HEAD WO CONTRAST (5MM) ? ?Result Date: 06/15/2021 ?CLINICAL DATA:  Follow-up subdural hematoma. EXAM: CT HEAD WITHOUT CONTRAST TECHNIQUE: Contiguous axial images were obtained from the base of the skull through the vertex without intravenous contrast. RADIATION DOSE REDUCTION: This exam was performed according to the departmental dose-optimization program which includes automated exposure control, adjustment of the mA  and/or kV according to patient size and/or use of iterative reconstruction technique. COMPARISON:  CT head 06/15/2021 at 1:30 a.m., 11/17/2020, and 04/23/2020 FINDINGS: Brain: A 4 mm thick hyperdensity along the posterior aspect of the falx is unchanged from today's earlier CT. While this is slightly more prominent than on the 11/17/2020 study, it is similar to the 04/23/2020 exam (particularly on coronal reformats) and is therefore not felt to reflect hemorrhage. No definite acute intracranial hemorrhage, acute infarct, mass, midline shift, or extra-axial fluid collection is identified. There is mild cerebral atrophy. Hypodensities in the cerebral white matter bilaterally are unchanged and nonspecific but compatible with chronic small vessel ischemic disease which is mild but advanced for age. Gliosis is again seen in the centrum semiovale bilaterally extending across the body of the corpus callosum. Vascular: Calcified atherosclerosis at the skull base. Skull: No acute fracture or suspicious osseous lesion. Sinuses/Orbits: Minimal mucosal thickening in the paranasal sinuses. Trace right mastoid fluid. Bilateral cataract extraction. Bilateral proptosis. Other: None. IMPRESSION: 1. Unchanged small hyperdense focus along the posterior aspect of the falx which is  chronic and does not reflect acute hemorrhage. This may instead be a venous structure, dural calcification, or a tiny meningioma. 2. No evidence of acute intracranial abnormality. 3. Mild chronic small vessel ischemic disease

## 2021-06-17 NOTE — Progress Notes (Signed)
Pt receives out-pt HD at [facility] on MWF. Pt arrives at 10:25 for 10:45 chair time. Will assist as needed.  ? ?Melven Sartorius ?Renal Navigator ?[phone] ?

## 2021-06-17 NOTE — Progress Notes (Signed)
?[facility] ?Progress Note  ? ?Subjective:   Patient seen and examined at bedside.  Out of the ICU.  Reports a little bleeding from her stump but otherwise doing ok.  Denies confusion, CP, SOB, abdominal pain and n/v/d.  ? ?Objective ?Vitals:  ? 06/16/21 2325 06/17/21 0140 06/17/21 0340 06/17/21 0817  ?BP: 97/80  129/66 136/76  ?Pulse: 100  95 68  ?Resp: '20  18 20  '$ ?Temp: 98.7 ?F (37.1 ?C)  97.8 ?F (36.6 ?C) 98 ?F (36.7 ?C)  ?TempSrc: Oral  Oral Oral  ?SpO2: 94%     ?Weight:  120.6 kg    ?Height:      ? ?Physical Exam ?General:chronically ill appearing female in NAD, AAOx3 ?Heart:RRR, no mrg ?Lungs:breath sounds decreased, nml WOB ?Abdomen:soft, NTND ?Extremities:L BKA, 1+ edema b/l ?Dialysis Access: RU AVF +b/t  ? ?Filed Weights  ? 06/15/21 0458 06/17/21 0140  ?Weight: 119.1 kg 120.6 kg  ? ? ?Intake/Output Summary (Last 24 hours) at 06/17/2021 1048 ?Last data filed at 06/16/2021 1558 ?Gross per 24 hour  ?Intake 480 ml  ?Output 3500 ml  ?Net -3020 ml  ? ? ?Additional Objective ?Labs: ?Basic Metabolic Panel: ?Recent Labs  ?Lab 06/15/21 ?0036 06/15/21 ?8242 06/15/21 ?0307 06/15/21 ?0308 06/15/21 ?0510 06/16/21 ?0436  ?NA 129*   < > 129* 129* 131* 126*  ?K 5.9*   < > 4.7 4.7 4.9 5.3*  ?CL 90*  --  91*  --  91* 86*  ?CO2 23  --   --   --  23 23  ?GLUCOSE 77  --  81  --  73 167*  ?BUN 57*  --  63*  --  59* 74*  ?CREATININE 6.50*  --  7.60*  --  6.98* 8.09*  ?CALCIUM 8.2*  --   --   --  8.4* 8.1*  ? < > = values in this interval not displayed.  ? ?Liver Function Tests: ?Recent Labs  ?Lab 06/15/21 ?0036  ?AST 38  ?ALT 29  ?ALKPHOS 150*  ?BILITOT 1.6*  ?PROT 7.4  ?ALBUMIN 2.9*  ? ?CBC: ?Recent Labs  ?Lab 06/15/21 ?0036 06/15/21 ?3536 06/15/21 ?1443 06/15/21 ?0308 06/16/21 ?1540  ?WBC 7.0  --   --   --  7.6  ?NEUTROABS 4.8  --   --   --   --   ?HGB 9.0*   < > 9.9* 9.2* 8.4*  ?HCT 28.8*   < > 29.0* 27.0* 26.8*  ?MCV 98.0  --   --   --  97.1  ?PLT 203  --   --   --  196  ? < > = values in this interval not  displayed.  ? ? ?Studies/Results: ?CT HEAD WO CONTRAST (5MM) ? ?Result Date: 06/15/2021 ?CLINICAL DATA:  Follow-up subdural hematoma. EXAM: CT HEAD WITHOUT CONTRAST TECHNIQUE: Contiguous axial images were obtained from the base of the skull through the vertex without intravenous contrast. RADIATION DOSE REDUCTION: This exam was performed according to the departmental dose-optimization program which includes automated exposure control, adjustment of the mA and/or kV according to patient size and/or use of iterative reconstruction technique. COMPARISON:  CT head 06/15/2021 at 1:30 a.m., 11/17/2020, and 04/23/2020 FINDINGS: Brain: A 4 mm thick hyperdensity along the posterior aspect of the falx is unchanged from today's earlier CT. While this is slightly more prominent than on the 11/17/2020 study, it is similar to the 04/23/2020 exam (particularly on coronal reformats) and is therefore not felt to reflect hemorrhage. No definite acute  intracranial hemorrhage, acute infarct, mass, midline shift, or extra-axial fluid collection is identified. There is mild cerebral atrophy. Hypodensities in the cerebral white matter bilaterally are unchanged and nonspecific but compatible with chronic small vessel ischemic disease which is mild but advanced for age. Gliosis is again seen in the centrum semiovale bilaterally extending across the body of the corpus callosum. Vascular: Calcified atherosclerosis at the skull base. Skull: No acute fracture or suspicious osseous lesion. Sinuses/Orbits: Minimal mucosal thickening in the paranasal sinuses. Trace right mastoid fluid. Bilateral cataract extraction. Bilateral proptosis. Other: None. IMPRESSION: 1. Unchanged small hyperdense focus along the posterior aspect of the falx which is chronic and does not reflect acute hemorrhage. This may instead be a venous structure, dural calcification, or a tiny meningioma. 2. No evidence of acute intracranial abnormality. 3. Mild chronic small vessel  ischemic disease. Electronically Signed   By: Logan Bores M.D.   On: 06/15/2021 14:23   ? ?Medications: ? sodium chloride Stopped (06/16/21 0900)  ? ? calcitRIOL  2.5 mcg Oral Q M,W,F-HD  ? Chlorhexidine Gluconate Cloth  6 each Topical Q0600  ? heparin injection (subcutaneous)  5,000 Units Subcutaneous Q8H  ? heparin sodium (porcine)      ? insulin aspart  0-15 Units Subcutaneous TID WC  ? insulin aspart  0-5 Units Subcutaneous QHS  ? mouth rinse  15 mL Mouth Rinse BID  ? midodrine  10 mg Oral Q8H  ? pantoprazole  40 mg Oral QHS  ? pramipexole  0.5 mg Oral TID  ? rosuvastatin  10 mg Oral QHS  ? ? ?Dialysis Orders: ?MWF GO ? 4h  450/800  110.6kg    2/2.5 bath  RUA AVF  Heparin 10000 bolus ?- last HD 5/12 came off 5.3kg up ?- last Hb 8.6 on 5/12 ?- rocaltrol 2.5ug po tiw ?- parsabiv 7.'5mg'$  IV tiw ?  ?Assessment/ Plan: ?AMS - opioid use vs SDH, a little better today ?SDH - follow up CT head reviewed by neurosurgery - no acute hemorrhage noted, appears chronic & favors venous structure,  Can restart eliquis tomorrow.  ?ESRD - on HD MWF. HD today per regular schedule.  ?Volume overload/ pulm edema on CXR -  Hx of large gains, Up 8-9kg on admit net UF 3.5L yesterday. Net goal another 3L today  ?Afib / RVR - to restart eliquis tomorrow.  Metoprolol on hold.  ?Hypotension - midodrine started.  Metoprolol on hold.  ?Hyperkalemia - mild, K 5.3, on HD now. ?Anemia ckd - Hb 8.4. Not on esa due to concern for possible malignancy - renal mass and pulmonary nodules. ?BMD ckd - CCa and phos in range, cont binder and po vdra while here ?Nutrition - renal diet w/fluid restictions ?Recent L BKA - per PMD. Wound care following ?DMT2 - per PMD ? ?[name], PA-C ?[facility] ?06/17/2021,10:48 AM ? LOS: 2 days  ?  ?

## 2021-06-17 NOTE — Evaluation (Signed)
Physical Therapy Evaluation Patient Details Name: [name] MRN: [id number] DOB: [birth year] Today's Date: 06/17/2021  History of Present Illness  56 y.o. female presented 06/15/21 from [facility] SNF with AMS due to small subacute SDH vs opiate use. Hospital course complicated by hypotension requiring Levophed. 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  Clinical Impression   Pt admitted with above diagnosis. Had been at SNF for rehab post her recent BKA when she had this admission to the hospital; Normally Lives at home with family (who work during the day), in a single -level home with a few steps to enter; Prior to admission, pt was able to manage independently; Presents to PT with generalized weakness, incr work of breathing, functional dependencies;  Participated well, even though she was quite sleepy; Needed minguard assist for safety sitting up EOB; Unable to clear hips from EOB with sit <>stand attempt from EOB; Able to simulate lateral scooting at EOB; fatigued easily; Pt currently with functional limitations due to the deficits listed below (see PT Problem List). Pt will benefit from skilled PT to increase their independence and safety with mobility to allow discharge to the venue listed below.    REcommend pt undergo HD in HD recliner         Recommendations for follow up therapy are one component of a multi-disciplinary discharge planning process, led by the attending physician.  Recommendations may be updated based on patient status, additional functional criteria and insurance authorization.  Follow Up Recommendations Skilled nursing-short term rehab (<3 hours/day) (To complete rehab course)    Assistance Recommended at Discharge Frequent or constant Supervision/Assistance  Patient can return home with the following  Two people to help with walking and/or transfers;A lot of help  with bathing/dressing/bathroom;Assistance with cooking/housework;Assist for transportation;Help with stairs or ramp for entrance    Equipment Recommendations BSC/3in1;Rolling walker (2 wheels) (drop-arm BSC, current RW is broken)  Recommendations for Other Services       Functional Status Assessment Patient has had a recent decline in their functional status and demonstrates the ability to make significant improvements in function in a reasonable and predictable amount of time.     Precautions / Restrictions Precautions Precautions: Fall Precaution Comments: new L BKA 06/02/21 Restrictions Weight Bearing Restrictions: Yes LLE Weight Bearing: Non weight bearing      Mobility  Bed Mobility Overal bed mobility: Needs Assistance Bed Mobility: Supine to Sit, Sit to Supine     Supine to sit: Min guard, HOB elevated Sit to supine: Supervision   General bed mobility comments: seated on EOB upon entry    Transfers Overall transfer level: Needs assistance Equipment used: Rolling walker (2 wheels) Transfers: Sit to/from Stand Sit to Stand: +2 physical assistance, Max assist          Lateral/Scoot Transfers: Min guard General transfer comment: unable to clear hips from elevated bed to stand, pt demonstrated ability to laterally scoot along EOB with min guard assist, but fatigues easily    Ambulation/Gait                  Stairs            Wheelchair Mobility    Modified Rankin (Stroke Patients Only)       Balance     Sitting balance-Leahy Scale: Good Sitting balance - Comments: no LOB donning R sock     Standing balance-Leahy Scale: Zero Standing balance comment: unable  to clear hips from EOB                             Pertinent Vitals/Pain Pain Assessment Pain Assessment: Faces Faces Pain Scale: No hurt Pain Intervention(s): Monitored during session    Home Living Family/patient expects to be discharged to:: Skilled nursing  facility Living Arrangements: Spouse/significant other;Children (2 boys 12 and 21 years old) Available Help at Discharge: Family;Available PRN/intermittently (family works) Type of Home: House Home Access: Stairs to enter   Secretary/administrator of Steps: 1   Home Layout: One level Home Equipment: Agricultural consultant (2 wheels);Rollator (4 wheels);Shower seat;Grab bars - tub/shower;Wheelchair - manual;Hand held shower head;Other (comment) (RW is broken) Additional Comments: reports she is home alone, using w/c or walker prior to BKA    Prior Function Prior Level of Function : Needs assist             Mobility Comments: mostly using w/c prior to last admission in SNF ADLs Comments: husband and son have had to assist wtih dressing since thumb amputation for pulling pants/socks up and buttoning/zipping. but endorses indep with bathing at baseline     Hand Dominance   Dominant Hand: Right    Extremity/Trunk Assessment   Upper Extremity Assessment Upper Extremity Assessment: Defer to OT evaluation LUE Deficits / Details: amputated thumb and first finger LUE Coordination: decreased fine motor    Lower Extremity Assessment Lower Extremity Assessment: Generalized weakness;RLE deficits/detail;LLE deficits/detail RLE Deficits / Details: Generally weak; ROM WFL for sitting; noting calloused, scaly skine on foot LLE Deficits / Details: L BKA with staples intact       Communication   Communication: HOH  Cognition Arousal/Alertness: Awake/alert, Lethargic Behavior During Therapy: WFL for tasks assessed/performed Overall Cognitive Status: Within Functional Limits for tasks assessed                                 General Comments: pt with intermittent sleepiness, but accurately answering home set up and PLOF when compared to responses from last hospitalization, following commands        General Comments General comments (skin integrity, edema, etc.): Session conducted  on supplemental O2 2 L; Noteworthy incr work of breathing throughout; O2 sats 93%    Exercises     Assessment/Plan    PT Assessment Patient needs continued PT services  PT Problem List Decreased strength;Decreased activity tolerance;Decreased balance;Decreased mobility;Decreased safety awareness;Impaired sensation;Pain;Decreased skin integrity       PT Treatment Interventions DME instruction;Gait training;Stair training;Functional mobility training;Therapeutic activities;Therapeutic exercise;Balance training;Neuromuscular re-education;Cognitive remediation;Patient/family education;Wheelchair mobility training    PT Goals (Current goals can be found in the Care Plan section)  Acute Rehab PT Goals Patient Stated Goal: to get stronger PT Goal Formulation: With patient Time For Goal Achievement: 07/01/21 Potential to Achieve Goals: Good    Frequency Min 3X/week     Co-evaluation PT/OT/SLP Co-Evaluation/Treatment: Yes Reason for Co-Treatment: For patient/therapist safety PT goals addressed during session: Mobility/safety with mobility OT goals addressed during session: ADL's and self-care       AM-PAC PT "6 Clicks" Mobility  Outcome Measure Help needed turning from your back to your side while in a flat bed without using bedrails?: None Help needed moving from lying on your back to sitting on the side of a flat bed without using bedrails?: A Little Help needed moving to and from a bed to a  chair (including a wheelchair)?: A Lot Help needed standing up from a chair using your arms (e.g., wheelchair or bedside chair)?: Total Help needed to walk in hospital room?: Total Help needed climbing 3-5 steps with a railing? : Total 6 Click Score: 12    End of Session Equipment Utilized During Treatment: Oxygen Activity Tolerance: Patient tolerated treatment well Patient left: in bed;with call bell/phone within reach (preparing to transport to HD) Nurse Communication: Mobility  status PT Visit Diagnosis: Unsteadiness on feet (R26.81);Muscle weakness (generalized) (M62.81);History of falling (Z91.81);Difficulty in walking, not elsewhere classified (R26.2)    Time: 4696-2952 PT Time Calculation (min) (ACUTE ONLY): 23 min   Charges:   PT Evaluation $PT Eval Moderate Complexity: 1 Mod          Van Clines, PT  Acute Rehabilitation Services Office [phone]   Levi Aland 06/17/2021, 1:44 PM

## 2021-06-17 NOTE — Evaluation (Signed)
Occupational Therapy Treatment ?Patient Details ?Name: [name] ?MRN: [id number] ?DOB: 1965/08/30 ?Today's Date: 06/17/2021 ? ? ?History of present illness 56 y.o. female presented 06/15/21 from [facility] with AMS due to small subacute SDH vs opiate use. Hospital course complicated by hypotension requiring Levophed. 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 ?  ?OT comments ? Pt reports not having been participating in rehab at SNF, but prior to [facility] she lived at home with her family who work and walking with a RW vs using a w/c. She was assisted for LB dressing and bathing herself. Pt plans to return to SNF to continue rehab upon discharge. Pt sleepy, but able to offer accurate PLOF and follow commands without difficulty. She was unable to clear her hips from bed in attempt to stand, but demonstrated ability to scoot along EOB simulating lateral transfer. She fatigues easily. Pt requires set up to max assist for ADLs. Agree with her plan to return for further rehab. Pt with Sp02 of 93% on 2L. Will follow acutely.   ? ?Recommendations for follow up therapy are one component of a multi-disciplinary discharge planning process, led by the attending physician.  Recommendations may be updated based on patient status, additional functional criteria and insurance authorization. ?   ?Follow Up Recommendations ? Skilled nursing-short term rehab (<3 hours/day)  ?  ?Assistance Recommended at Discharge Frequent or constant Supervision/Assistance  ?Patient can return home with the following ? A lot of help with walking and/or transfers;A lot of help with bathing/dressing/bathroom;Assistance with cooking/housework;Direct supervision/assist for medications management;Direct supervision/assist for financial management;Assist for transportation;Help with stairs or ramp for entrance ?  ?Equipment Recommendations ? Other (comment)  (defer to next venue)  ?  ?Recommendations for Other Services   ? ?  ?Precautions / Restrictions Precautions ?Precautions: Fall ?Restrictions ?Weight Bearing Restrictions: Yes ?LLE Weight Bearing: Non weight bearing  ? ? ?  ? ?Mobility Bed Mobility ?Overal bed mobility: Needs Assistance ?Bed Mobility: Supine to Sit, Sit to Supine ?  ?  ?Supine to sit: Min guard, HOB elevated ?Sit to supine: Supervision ?  ?  ?  ? ?Transfers ?Overall transfer level: Needs assistance ?Equipment used: Rolling walker (2 wheels) ?Transfers: Sit to/from Stand ?  ?  ?  ?  ?  ?  ?General transfer comment: unable to clear hips from elevated bed to stand, pt demonstrated ability to laterally scoot along EOB with min guard assist, but fatigues easily ?  ?  ?Balance Overall balance assessment: Needs assistance ?  ?Sitting balance-Leahy Scale: Good ?Sitting balance - Comments: no LOB donning R sock ?  ?  ?Standing balance-Leahy Scale: Zero ?Standing balance comment: unable to clear hips from EOB ?  ?  ?  ?  ?  ?  ?  ?  ?  ?  ?  ?   ? ?ADL either performed or assessed with clinical judgement  ? ?ADL Overall ADL's : Needs assistance/impaired ?Eating/Feeding: Minimal assistance;Sitting ?  ?Grooming: Min guard;Sitting ?  ?Upper Body Bathing: Minimal assistance;Sitting ?  ?Lower Body Bathing: Bed level;Moderate assistance;Sitting/lateral leans ?  ?Upper Body Dressing : Minimal assistance;Sitting ?  ?Lower Body Dressing: Sitting/lateral leans;Min guard ?Lower Body Dressing Details (indicate cue type and reason): donned R sock ?Toilet Transfer: Minimal assistance ?Toilet Transfer Details (indicate cue type and reason): simulated lateral scoot ?Toileting- Clothing Manipulation and Hygiene: Maximal assistance;Bed level ?  ?  ?  ?  ?General ADL  Comments: Pt with decreaed activity tolerance. ?  ? ?Extremity/Trunk Assessment Upper Extremity Assessment ?Upper Extremity Assessment: LUE deficits/detail ?LUE Deficits / Details: amputated thumb and first  finger ?LUE Coordination: decreased fine motor ?  ?Lower Extremity Assessment ?Lower Extremity Assessment: Defer to PT evaluation ?LLE Deficits / Details: L BKA with staples intact ?  ?  ?  ? ?Vision Ability to See in Adequate Light: 0 Adequate ?Patient Visual Report: No change from baseline ?  ?  ?Perception   ?  ?Praxis   ?  ? ?Cognition Arousal/Alertness: Awake/alert, Lethargic ?Behavior During Therapy: [facility] for tasks assessed/performed ?Overall Cognitive Status: Within Functional Limits for tasks assessed ?  ?  ?  ?  ?  ?  ?  ?  ?  ?  ?  ?  ?  ?  ?  ?  ?General Comments: pt with intermittent sleepiness, but accurately answering home set up and PLOF when compared to responses from last hospitalization, following commands ?  ?  ?   ?Exercises   ? ?  ?Shoulder Instructions   ? ? ?  ?General Comments    ? ? ?Pertinent Vitals/ Pain       Pain Assessment ?Pain Assessment: Faces ?Faces Pain Scale: No hurt ? ?Home Living Family/patient expects to be discharged to:: Skilled nursing facility ?Living Arrangements: Spouse/significant other;Children (2 boys 25 and 47 years old) ?Available Help at Discharge: Family;Available PRN/intermittently (family works) ?Type of Home: House ?Home Access: Stairs to enter ?Entrance Stairs-Number of Steps: 1 ?  ?Home Layout: One level ?  ?  ?Bathroom Shower/Tub: Walk-in shower ?  ?Bathroom Toilet: Handicapped height ?  ?How Accessible: Other (comment) (not accessible) ?Home Equipment: Conservation officer, nature (2 wheels);Rollator (4 wheels);Shower seat;Grab bars - tub/shower;Wheelchair - manual;Hand held shower head;Other (comment) (RW is broken) ?  ?Additional Comments: reports she is home alone, using w/c or walker prior to BKA ?  ? ?  ?Prior Functioning/Environment    ?  ?  ?  ?   ? ?Frequency ? Min 2X/week  ? ? ? ? ?  ?Progress Toward Goals ? ?OT Goals(current goals can now be found in the care plan section) ?   ? ?Acute Rehab OT Goals ?OT Goal Formulation: With patient ?Time For Goal Achievement:  07/01/21 ?Potential to Achieve Goals: Good  ?Plan     ? ?Co-evaluation ? ? ? PT/OT/SLP Co-Evaluation/Treatment: Yes ?Reason for Co-Treatment: For patient/therapist safety ?  ?OT goals addressed during session: ADL's and self-care ?  ? ?  ?AM-PAC OT "6 Clicks" Daily Activity     ?Outcome Measure ? ? Help from another person eating meals?: A Little ?Help from another person taking care of personal grooming?: A Little ?Help from another person toileting, which includes using toliet, bedpan, or urinal?: A Lot ?Help from another person bathing (including washing, rinsing, drying)?: A Lot ?Help from another person to put on and taking off regular upper body clothing?: A Little ?Help from another person to put on and taking off regular lower body clothing?: A Little ?6 Click Score: 16 ? ?  ?End of Session Equipment Utilized During Treatment: Rolling walker (2 wheels);Oxygen (2L) ? ?OT Visit Diagnosis: Muscle weakness (generalized) (M62.81) ?  ?Activity Tolerance Patient tolerated treatment well ?  ?Patient Left in bed;with call bell/phone within reach (going to HD) ?  ?Nurse Communication   ?  ? ?   ? ?Time: 9470-9628 ?OT Time Calculation (min): 20 min ? ?Charges: OT General Charges ?$OT Visit: 1 Visit ?OT  Evaluation ?$OT Eval Moderate Complexity: 1 Mod ? ?Nestor Lewandowsky, OTR/L ?Acute Rehabilitation Services ?Pager: [phone] ?Office: [phone]  ?Malka So ?06/17/2021, 12:13 PM ? ? ?

## 2021-06-17 NOTE — NC FL2 (Signed)
[place] LEVEL OF CARE SCREENING TOOL     IDENTIFICATION  Patient Name: [name] Birthdate: [birth year] Sex: female Admission Date (Current Location): 06/15/2021  [facility] and [place] Number:  Herbalist and Address:  [facility], [place] [address], [place], [place] [address]      Provider Number: [id number]  Attending Physician Name and Address:  Elgergawy, Silver Huguenin, MD  Relative Name and Phone Number:       Current Level of Care: Hospital Recommended Level of Care: [place] Prior Approval Number:    Date Approved/Denied:   PASRR Number: [id number]  Discharge Plan: SNF    Current Diagnoses: Patient Active Problem List   Diagnosis Date Noted   Acute encephalopathy 06/15/2021   Opiate overdose ([place])    Subdural bleeding ([place])    Atrial fibrillation with RVR ([place]) 06/05/2021   Asterixis 56/31/4970   Acute metabolic encephalopathy 26/37/8588   Bacteremia due to Enterobacter aerogenes 06/03/2021   Hypotension 06/03/2021   Sepsis ([place]) 05/26/2021   PVD (peripheral vascular disease) ([place])    Osteomyelitis ([place]) 05/25/2021   Acute respiratory failure with hypoxia ([place]) 04/26/2021   Hypertensive urgency    Acute pulmonary edema (HCC)    Obesity (BMI 30-39.9) 01/10/2021   Finger infection, left hand, second digit 01/10/2021   Pulmonary nodule seen on imaging study 01/10/2021   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 01/10/2021   Ventral hernia 01/10/2021   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[place]) 01/10/2021   Rheumatoid arthritis ([place]) 01/10/2021   Osteomyelitis of left hand ([place]) 01/10/2021   Possible osteomyelitis of finger of left hand ([place]) 01/09/2021   Abnormal CT scan, chest 11/27/2020   OSA (obstructive sleep apnea), concern for 11/27/2020   Thyroid nodule 04/10/2020   Renal mass, right 08/15/2019   Dyslipidemia 11/20/2018   Shortness of breath 10/23/2018   Morbid  obesity ([place]) 10/23/2018   Coronary artery disease 10/23/2018   Tobacco abuse 10/23/2018   Unspecified protein-calorie malnutrition ([place]) 10/11/2018   End-stage renal disease on hemodialysis ([place]) 09/27/2018   ESRD (end stage renal disease) ([place]) 09/26/2018   Chronic pain disorder 09/26/2018   left foot wound with unreconstructable vascular disease 09/18/2018   Iron deficiency anemia, unspecified 01/10/2018   Coagulation defect, unspecified ([place]) 12/06/2017   Itching 09/22/2017   Secondary hyperparathyroidism of renal origin ([place]) 09/01/2017   Disorder of phosphorus metabolism, unspecified 08/25/2017   Anemia in chronic kidney disease 08/24/2017   Left ventricular hypertrophy 06/17/2017   Diabetic retinopathy 50/27/7412   Diastolic dysfunction 87/86/7672   Hyperlipidemia associated with type 2 diabetes mellitus ([place]) 01/30/2011   Type 2 diabetes mellitus with diabetic chronic kidney disease ([place]) 01/29/2011   Hypertension associated with diabetes ([place])     Orientation RESPIRATION BLADDER Height & Weight     Self, Place  O2 (Nasal cannula 4L) Continent Weight: 260 lb 2.3 oz (118 kg) Height:  '5\' 8"'$  (172.7 cm)  BEHAVIORAL SYMPTOMS/MOOD NEUROLOGICAL BOWEL NUTRITION STATUS      Continent Diet (See dc summary)  AMBULATORY STATUS COMMUNICATION OF NEEDS Skin   Extensive Assist Verbally Surgical wounds (Closed incision on leg; wound on pretibial)                       Personal Care Assistance Level of Assistance  Bathing, Feeding, Dressing Bathing Assistance: Limited assistance Feeding assistance: Limited assistance Dressing Assistance: Limited assistance     Functional Limitations Info  Sight, Hearing Sight Info: Impaired Hearing Info: Impaired      SPECIAL CARE FACTORS FREQUENCY  PT (By licensed PT), OT (By licensed OT)     PT Frequency: 5x/week OT Frequency: 5x/week            Contractures Contractures Info: Not present    Additional Factors Info  Code  Status, Allergies, Insulin Sliding Scale Code Status Info: Full Allergies Info: Bupropion,Dilaudid   Insulin Sliding Scale Info: See dc summary       Current Medications (06/17/2021):  This is the current hospital active medication list Current Facility-Administered Medications  Medication Dose Route Frequency Provider Last Rate Last Admin   0.9 %  sodium chloride infusion   Intravenous PRN Roney Jaffe, MD   Stopped at 06/16/21 0900   acetaminophen (TYLENOL) 325 MG tablet            acetaminophen (TYLENOL) tablet 650 mg  650 mg Oral Q6H PRN Roney Jaffe, MD   650 mg at 06/17/21 1150   calcitRIOL (ROCALTROL) capsule 2.5 mcg  2.5 mcg Oral Q M,W,F-HD Roney Jaffe, MD   2.5 mcg at 06/16/21 0817   Chlorhexidine Gluconate Cloth 2 % PADS 6 each  6 each Topical Q0600 Collier Bullock, MD   6 each at 06/17/21 0538   docusate sodium (COLACE) capsule 100 mg  100 mg Oral BID PRN Collier Bullock, MD       heparin injection 5,000 Units  5,000 Units Subcutaneous Q8H Mannam, Praveen, MD   5,000 Units at 06/17/21 0538   hydrOXYzine (ATARAX) tablet 25 mg  25 mg Oral Daily PRN Andres Labrum D, PA-C       insulin aspart (novoLOG) injection 0-15 Units  0-15 Units Subcutaneous TID WC Elsie Lincoln, MD   3 Units at 06/17/21 0858   insulin aspart (novoLOG) injection 0-5 Units  0-5 Units Subcutaneous QHS Elsie Lincoln, MD       MEDLINE mouth rinse  15 mL Mouth Rinse BID Collier Bullock, MD   15 mL at 06/17/21 0906   midodrine (PROAMATINE) tablet 10 mg  10 mg Oral Q8H Thurnell Lose, MD   10 mg at 06/17/21 1446   ondansetron (ZOFRAN) injection 4 mg  4 mg Intravenous Q6H PRN Collier Bullock, MD       oxyCODONE-acetaminophen (PERCOCET/ROXICET) 5-325 MG per tablet            pantoprazole (PROTONIX) EC tablet 40 mg  40 mg Oral QHS Thurnell Lose, MD   40 mg at 06/16/21 2216   polyethylene glycol (MIRALAX / GLYCOLAX) packet 17 g  17 g Oral Daily PRN Collier Bullock, MD       pramipexole (MIRAPEX) tablet  0.5 mg  0.5 mg Oral TID Collier Bullock, MD   0.5 mg at 06/17/21 0859   rosuvastatin (CRESTOR) tablet 10 mg  10 mg Oral QHS Andres Labrum D, PA-C   10 mg at 06/16/21 2216     Discharge Medications: Please see discharge summary for a list of discharge medications.  Relevant Imaging Results:  Relevant Lab Results:   Additional Information SSN [id number] Moderna COVID-19 Vaccine 07/30/2020 , 12/05/2019 , 05/18/2019 , 04/20/2019, [facility] MWF arrive 10:25am, chair time 10:45am  Benard Halsted, LCSW

## 2021-06-18 DIAGNOSIS — Z992 Dependence on renal dialysis: Secondary | ICD-10-CM

## 2021-06-18 DIAGNOSIS — D631 Anemia in chronic kidney disease: Secondary | ICD-10-CM

## 2021-06-18 DIAGNOSIS — N186 End stage renal disease: Secondary | ICD-10-CM

## 2021-06-18 LAB — GLUCOSE, CAPILLARY
Glucose-Capillary: 129 mg/dL — ABNORMAL HIGH (ref 70–99)
Glucose-Capillary: 131 mg/dL — ABNORMAL HIGH (ref 70–99)
Glucose-Capillary: 107 mg/dL — ABNORMAL HIGH (ref 70–99)
Glucose-Capillary: 106 mg/dL — ABNORMAL HIGH (ref 70–99)

## 2021-06-18 LAB — BASIC METABOLIC PANEL
Anion gap: 13 (ref 5–15)
BUN: 37 mg/dL — ABNORMAL HIGH (ref 6–20)
CO2: 23 mmol/L (ref 22–32)
Calcium: 8.2 mg/dL — ABNORMAL LOW (ref 8.9–10.3)
Chloride: 93 mmol/L — ABNORMAL LOW (ref 98–111)
Creatinine, Ser: 5.35 mg/dL — ABNORMAL HIGH (ref 0.44–1.00)
GFR, Estimated: 9 mL/min — ABNORMAL LOW (ref >60)
Glucose, Bld: 125 mg/dL — ABNORMAL HIGH (ref 70–99)
Potassium: 4.5 mmol/L (ref 3.5–5.1)
Sodium: 129 mmol/L — ABNORMAL LOW (ref 135–145)

## 2021-06-18 LAB — CBC
HCT: 26.8 % — ABNORMAL LOW (ref 36.0–46.0)
Hemoglobin: 8.3 g/dL — ABNORMAL LOW (ref 12.0–15.0)
MCH: 30.1 pg (ref 26.0–34.0)
MCHC: 31 g/dL (ref 30.0–36.0)
MCV: 97.1 fL (ref 80.0–100.0)
Platelets: 222 10*3/uL (ref 150–400)
RBC: 2.76 MIL/uL — ABNORMAL LOW (ref 3.87–5.11)
RDW: 16.8 % — ABNORMAL HIGH (ref 11.5–15.5)
WBC: 6.3 10*3/uL (ref 4.0–10.5)
nRBC: 0.9 % — ABNORMAL HIGH (ref 0.0–0.2)

## 2021-06-18 MED ORDER — INSULIN ASPART 100 UNIT/ML IJ SOLN
0.0000 [IU] | Freq: Three times a day (TID) | INTRAMUSCULAR | 11 refills | Status: DC
Start: 2021-06-18 — End: 2021-06-19

## 2021-06-18 MED ORDER — CEPHALEXIN 500 MG PO CAPS
500.0000 mg | ORAL_CAPSULE | Freq: Every day | ORAL | 0 refills | Status: AC
Start: 2021-06-18 — End: 2021-06-25

## 2021-06-18 MED ORDER — OXYCODONE HCL 5 MG PO CAPS: 5.0000 mg | ORAL_CAPSULE | Freq: Two times a day (BID) | ORAL | 0 refills | Status: AC | PRN

## 2021-06-18 MED ORDER — APIXABAN 5 MG PO TABS: 5.0000 mg | ORAL_TABLET | Freq: Two times a day (BID) | ORAL | Status: DC

## 2021-06-18 MED ORDER — DOXYCYCLINE HYCLATE 100 MG PO TABS: 100.0000 mg | ORAL_TABLET | Freq: Two times a day (BID) | ORAL | 0 refills | Status: AC

## 2021-06-18 MED ORDER — MIDODRINE HCL 10 MG PO TABS: 5.0000 mg | ORAL_TABLET | Freq: Three times a day (TID) | ORAL | Status: DC

## 2021-06-18 MED ORDER — INSULIN ASPART 100 UNIT/ML IJ SOLN: 0.0000 [IU] | Freq: Every day | INTRAMUSCULAR | 11 refills | Status: DC

## 2021-06-18 MED ORDER — PANTOPRAZOLE SODIUM 40 MG PO TBEC: 40.0000 mg | DELAYED_RELEASE_TABLET | Freq: Every day | ORAL | Status: AC

## 2021-06-18 NOTE — Discharge Summary (Addendum)
Physician Discharge Summary  [name] IRJ:[id number] DOB: 04-16-1965 DOA: 06/15/2021  PCP: Sandi Mariscal, MD  Admit date: 06/15/2021 Discharge date: 06/18/2021  Admitted From: SNF Disposition:  SNF   Recommendations for Outpatient Follow-up:  Follow up with Vascular surgery patient already has an appointment scheduled for 07/10/2021 at 8:30 AM.  As discussed with vascular surgery, they will reschedule earlier appointment for her within 2 weeks. Patient narcotics has been minimized, please monitor and adjust as needed Patient will be discharged only on insulin sliding scale during hospital stay as her CBG has been well controlled, if it starts to increase at her facility she can be resumed gradually on her insulin 75/25.   Discharge Condition:Stable CODE STATUS:FULL Diet recommendation: Renal / Carb Modified   Brief/Interim Summary:  [name], is a 56 y.o. 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 Prior to arrival, reports of patient leaving facility and coming back altered. EMS called on 5/14 for AMS, responded to to narcan.  ED course was notable for continued unresponsiveness.  Patient started on Narcan drip with response.  Head CT CT was obtained which was concerning for a small 4 mm subdural hemorrhage.  Neurosurgery was consulted.  Patient was previously on Eliquis and was reversed with Andexxa.  Patient was also found to be in A-fib with RVR and started on a diltiazem drip.  Afebrile.  No leukocytosis.  Heart rate has been controlled, she came off Narcan drip and she was transferred to medical floor. -Patient hospitalization at [facility], discharged on 5/11, for bacteremia antiplatelet care limb ischemia status post left BKA 5/2    Altered mental status Acute toxic  encephalopathy with possible opioid overuse - question secondary to opioid overdose versus subdural hemorrhage.  Patient afebrile with no significant leukocytosis, do not suspect infectious etiology. -responsive to Narcan, patient denies narcotic use.  Denies illicit drug use -She has been Percocet 10/325 mg 1 tablet 3 times daily as needed by facility, this has been decreased on discharge to 5 mg every 12 hours as needed.   Possible 4 mm subdural hemorrhage as seen on CT head 5/15, unclear etiology no reports of head trauma, patient on anticoagulation -Neurosurgery input greatly appreciated, findings appear to be chronic and favored a venous structure, no additional neurosurgical recommendation, Eliquis  was resumed on 5/18, and to be continued on discharge -eliquis was reversed with Andexxa -hold home oxy   Afib with RVR on eliquis -eliquis reversed w/ andexxa due to SDH -cardizem drip stopped 5/15; resumed back on home dose metoprolol -Resumed on Eliquis   Hypotension:  - low BP during HD requiring levo initially, this has resolved, started on midodrine.   Hyperkalemia > improved End-stage renal disease -Dialyzed by renal, to continue her dialysis on Monday Wednesday Friday schedule.    Hyponatremia -Management per renal   HX CAD Chronic Diastolic CHF, last echo EF 60 to 65% on 06/02/2021 HX HLD HX HTN -cont statin, metoprolol and Plavix  Normocytic, anemia   history of kidney disease  DM2 -Was kept on insulin sliding scale during hospital stay with good control, so I have discontinued her insulin 75/25 on discharge, this can be resumed if her CBG started to increase.   Left BKA Right venous stasis  wound -Patient still having staples from her recent left BKA, patient was seen and evaluated by vascular surgery, she has some drainage likely from clotted blood, but given overall poor circulation, will cover with 1 week of broad-spectrum antibiotic Keflex and doxycycline, and  they will arrange for earlier follow-up appointment in the next 2 weeks.  Marland Kitchen  Hx of anxiety -cont home hydroxyzine    Discharge Diagnoses:  Active Problems:   Anemia in chronic kidney disease   Hypotension   Atrial fibrillation with RVR (HCC)   Hyperlipidemia associated with type 2 diabetes mellitus (HCC)   Rheumatoid arthritis (HCC)   ESRD (end stage renal disease) (HCC)   Type 2 diabetes mellitus with diabetic chronic kidney disease (HCC)   Diastolic dysfunction   PVD (peripheral vascular disease) ([place])   Acute encephalopathy    Discharge Instructions  Discharge Instructions     Diet - low sodium heart healthy   Complete by: As directed    Discharge instructions   Complete by: As directed    Follow with Primary MD Sandi Mariscal, MD/ SNF physician  Get CBC, CMP, checked  by Primary MD next visit.    Activity: As tolerated with Full fall precautions use walker/cane & assistance as needed   Disposition SNF   Diet: renal/carb modified  On your next visit with your primary care physician please Get Medicines reviewed and adjusted.   Please request your Prim.MD to go over all Hospital Tests and Procedure/Radiological results at the follow up, please get all Hospital records sent to your Prim MD by signing hospital release before you go home.   If you experience worsening of your admission symptoms, develop shortness of breath, life threatening emergency, suicidal or homicidal thoughts you must seek medical attention immediately by calling 911 or calling your MD immediately  if symptoms less severe.  You Must read complete instructions/literature along with all the possible adverse reactions/side effects for all the Medicines you take and that have been prescribed to you. Take any new Medicines after you have completely understood and accpet all the possible adverse reactions/side effects.   Do not drive, operating heavy machinery, perform activities at heights, swimming or  participation in water activities or provide baby sitting services if your were admitted for syncope or siezures until you have seen by Primary MD or a Neurologist and advised to do so again.  Do not drive when taking Pain medications.    Do not take more than prescribed Pain, Sleep and Anxiety Medications  Special Instructions: If you have smoked or chewed Tobacco  in the last 2 yrs please stop smoking, stop any regular Alcohol  and or any Recreational drug use.  Wear Seat belts while driving.   Please note  You were cared for by a hospitalist during your hospital stay. If you have any questions about your discharge medications or the care you received while you were in the hospital after you are discharged, you can call the unit and asked to speak with the hospitalist on call if the hospitalist that took care of you is not available. Once you are discharged, your primary care physician will handle any further medical issues. Please note that NO REFILLS for any discharge medications will be authorized once you are discharged, as it is imperative that you return to your primary care physician (or establish a relationship with a primary care physician if you do not have one) for your aftercare needs so that they can reassess your need for  medications and monitor your lab values.   Increase activity slowly   Complete by: As directed    No wound care   Complete by: As directed       Allergies as of 06/18/2021       Reactions   Bupropion Itching   Dilaudid [hydromorphone] Other (See Comments)   Apnea, required intubation        Medication List     STOP taking these medications    Insulin Lispro Prot & Lispro (75-25) 100 UNIT/ML Kwikpen Commonly known as: HUMALOG 75/25 MIX   oxyCODONE-acetaminophen 10-325 MG tablet Commonly known as: PERCOCET       TAKE these medications    acetaminophen 500 MG tablet Commonly known as: TYLENOL Take 1,000 mg by mouth daily as needed for  headache (pain).   albuterol 108 (90 Base) MCG/ACT inhaler Commonly known as: VENTOLIN HFA Inhale 2 puffs into the lungs every 6 (six) hours as needed for wheezing or shortness of breath.   apixaban 5 MG Tabs tablet Commonly known as: ELIQUIS Take 1 tablet (5 mg total) by mouth 2 (two) times daily.   Auryxia 1 GM 210 MG(Fe) tablet Generic drug: ferric citrate Take 420-840 mg by mouth See admin instructions. 840 mg twice daily with meals,  420 mg with a snacks   b complex-vitamin c-folic acid 0.8 MG Tabs tablet Take 1 tablet by mouth every Monday, Wednesday, and Friday with hemodialysis.   bisacodyl 10 MG suppository Commonly known as: DULCOLAX Place 10 mg rectally as needed for moderate constipation.   clopidogrel 75 MG tablet Commonly known as: PLAVIX Take 1 tablet (75 mg total) by mouth daily with breakfast.   Humira Pen 40 MG/0.4ML Pnkt Generic drug: Adalimumab Inject 40 mg into the skin every 14 (fourteen) days.   hydrOXYzine 25 MG tablet Commonly known as: ATARAX Take 25 mg by mouth every morning.   insulin aspart 100 UNIT/ML injection Commonly known as: novoLOG Inject 0-15 Units into the skin 3 (three) times daily with meals.   insulin aspart 100 UNIT/ML injection Commonly known as: novoLOG Inject 0-5 Units into the skin at bedtime.   metoprolol tartrate 25 MG tablet Commonly known as: LOPRESSOR Take 0.5 tablets (12.5 mg total) by mouth 2 (two) times daily.   midodrine 10 MG tablet Commonly known as: PROAMATINE Take 0.5 tablets (5 mg total) by mouth 3 (three) times daily. What changed:  how much to take when to take this reasons to take this   MIRCERA IJ Dialysis Monday,Wednesday and friday   Narcan 4 MG/0.1ML Liqd nasal spray kit Generic drug: naloxone Place 1 spray into the nose as needed (accidental overdose.).   NON FORMULARY BIPAP at bedtime   omega-3 fish oil 1000 MG Caps capsule Commonly known as: MAXEPA Take 1,000 mg by mouth daily.    oxycodone 5 MG capsule Commonly known as: OXY-IR Take 1 capsule (5 mg total) by mouth every 12 (twelve) hours as needed for up to 5 days.   OXYGEN Inhale 2 L into the lungs continuous.   pantoprazole 40 MG tablet Commonly known as: PROTONIX Take 1 tablet (40 mg total) by mouth at bedtime.   pramipexole 0.5 MG tablet Commonly known as: MIRAPEX Take 0.5 mg by mouth daily.   PROSTAT PO Take 30 mLs by mouth in the morning and at bedtime.   RA SALINE ENEMA RE Place 1 Dose rectally as needed.   rosuvastatin 40 MG tablet Commonly known as: CRESTOR TAKE 1 TABLET BY MOUTH  DAILY *PATIENT NEEDS APPOINTMENT* What changed: See the new instructions.   zinc sulfate 50 MG Caps capsule Take 220 mg by mouth daily.        Contact information for follow-up providers     Broadus John, MD Follow up on 07/10/2021.   Specialty: Vascular Surgery Why: 8:30 am Contact information: [address] [place] [address] [phone]              Contact information for after-discharge care     Destination     [facility] Preferred SNF .   Service: Skilled Nursing Contact information: [address] [address] [phone]                    Allergies  Allergen Reactions   Bupropion Itching   Dilaudid [Hydromorphone] Other (See Comments)    Apnea, required intubation    Consultations: Neurosurgery Renal PCCM   Procedures/Studies: DG Chest 1 View  Result Date: 06/04/2021 CLINICAL DATA:  Encounter for shortness of breath. EXAM: CHEST  1 VIEW COMPARISON:  Portable chest 04/26/2021. FINDINGS: The heart is moderately enlarged. There is perihilar vascular congestion and mild lower zonal interstitial consolidation consistent with edema. There are minimal pleural effusions. Patchy haziness similar to the prior study is noted in the lower lung fields and may suggest ground-glass edema, pneumonia or combination. The upper  lung fields are generally clear. There is a stable mediastinal configuration with aortic atherosclerosis and tortuosity. Thoracic cage intact. IMPRESSION: 1. Cardiomegaly with mild vascular prominence. 2. Interstitial consolidation in the bases most likely due to edema, with patchy lower zonal haziness which could be ground-glass edema and/or pneumonia. 3. Minimal pleural effusions. 4. Clinical correlation and radiographic follow-up recommended. 5. Aortic atherosclerosis. Electronically Signed   By: Telford Nab M.D.   On: 06/04/2021 02:07   CT HEAD WO CONTRAST (5MM)  Result Date: 06/15/2021 CLINICAL DATA:  Follow-up subdural hematoma. EXAM: CT HEAD WITHOUT CONTRAST TECHNIQUE: Contiguous axial images were obtained from the base of the skull through the vertex without intravenous contrast. RADIATION DOSE REDUCTION: This exam was performed according to the departmental dose-optimization program which includes automated exposure control, adjustment of the mA and/or kV according to patient size and/or use of iterative reconstruction technique. COMPARISON:  CT head 06/15/2021 at 1:30 a.m., 11/17/2020, and 04/23/2020 FINDINGS: Brain: A 4 mm thick hyperdensity along the posterior aspect of the falx is unchanged from today's earlier CT. While this is slightly more prominent than on the 11/17/2020 study, it is similar to the 04/23/2020 exam (particularly on coronal reformats) and is therefore not felt to reflect hemorrhage. No definite acute intracranial hemorrhage, acute infarct, mass, midline shift, or extra-axial fluid collection is identified. There is mild cerebral atrophy. Hypodensities in the cerebral white matter bilaterally are unchanged and nonspecific but compatible with chronic small vessel ischemic disease which is mild but advanced for age. Gliosis is again seen in the centrum semiovale bilaterally extending across the body of the corpus callosum. Vascular: Calcified atherosclerosis at the skull base.  Skull: No acute fracture or suspicious osseous lesion. Sinuses/Orbits: Minimal mucosal thickening in the paranasal sinuses. Trace right mastoid fluid. Bilateral cataract extraction. Bilateral proptosis. Other: None. IMPRESSION: 1. Unchanged small hyperdense focus along the posterior aspect of the falx which is chronic and does not reflect acute hemorrhage. This may instead be a venous structure, dural calcification, or a tiny meningioma. 2. No evidence of acute intracranial abnormality. 3. Mild chronic small vessel ischemic  disease. Electronically Signed   By: Logan Bores M.D.   On: 06/15/2021 14:23   CT HEAD WO CONTRAST  Result Date: 06/15/2021 CLINICAL DATA:  Initial evaluation for acute altered mental status. EXAM: CT HEAD WITHOUT CONTRAST TECHNIQUE: Contiguous axial images were obtained from the base of the skull through the vertex without intravenous contrast. RADIATION DOSE REDUCTION: This exam was performed according to the departmental dose-optimization program which includes automated exposure control, adjustment of the mA and/or kV according to patient size and/or use of iterative reconstruction technique. COMPARISON:  Prior CT from 11/17/2020. FINDINGS: Brain: Age-related cerebral atrophy with chronic microvascular ischemic disease. Small hyperdensity along the posterior falx measuring up to 4 mm in maximal thickness is present (series 2, image 16). While a small venous structure is seen at this location on prior head CT, this is more prominent on today's exam, raising the possibility for a small focal subdural hemorrhage. No associated mass effect. No other acute intracranial hemorrhage. No acute large vessel territory infarct. No mass lesion, mass effect or midline shift. No hydrocephalus. Vascular: No abnormal hyperdense vessel. Calcified atherosclerosis present at the skull base. Skull: Scalp soft tissues and calvarium demonstrate no acute finding. Sinuses/Orbits: Mild bilateral exophthalmos.  Prior bilateral ocular lens replacement. Mild mucosal thickening about the right maxillary sinus. Trace right mastoid effusion. Other: None. IMPRESSION: 1. Small hyperdensity along the posterior falx measuring up to 4 mm in maximal thickness. While this could potentially be venous in nature, a possible small subdural hemorrhage is difficult to exclude. Short interval follow-up CT in approximately 12 hours recommended for further evaluation. No associated mass effect. 2. No other acute intracranial abnormality. 3. Age-related cerebral atrophy with chronic microvascular ischemic disease. Critical Value/emergent results were called by telephone at the time of interpretation on 06/15/2021 at 1:47 am to provider [facility] , who verbally acknowledged these results. Electronically Signed   By: Jeannine Boga M.D.   On: 06/15/2021 01:51   PERIPHERAL VASCULAR CATHETERIZATION  Result Date: 05/27/2021 Images from the original result were not included. Patient name: CARRYE GOLLER MRN: [id number] DOB: [birth year] Sex: female 05/27/2021 Pre-operative Diagnosis: Left lower extremity [place] 5 critical limb ischemia Post-operative diagnosis:  Same Surgeon:  Broadus John, MD Procedure Performed: 1.  Ultrasound-guided micropuncture access of the right common femoral artery 2.  Aortogram 3.  Second-order cannulation, left lower extremity angiogram 4.  Laser atherectomy of the anterior tibial artery 5.  Balloon angioplasty of the anterior tibial artery 6.  Device assisted closure-Mynx Indications: Patient is a 56 year old female who presented to the ED with dry gangrene of the left fifth toe.  ABIs demonstrated monophasic waveforms in the left lower extremity.  After discussing risks and benefits of left lower extremity angiography in an effort to define and improve distal perfusion for wound healing, Jasiyah Findings: Aortogram: Bilateral renal arteries patent, no flow-limiting stenosis. On the left: Normal common  femoral artery, normal profunda, [place] femoral artery with previously placed stents.  These are patent.  Popliteal artery without stenosis.  The posterior tibial artery provides single-vessel inline flow to the foot with filling of medial and lateral plantar arteries.  The peroneal artery occludes in the mid tibia, anterior tibial artery occludes immediately distal to its ostia.  Procedure:  The patient was identified in the holding area and taken to room 8.  The patient was then placed supine on the table and prepped and draped in the usual sterile fashion.  A time out was called.  Ultrasound was  used to evaluate the right common femoral artery.  It was patent .  A digital ultrasound image was acquired.  A micropuncture needle was used to access the right common femoral artery under ultrasound guidance.  An 018 wire was advanced without resistance and a micropuncture sheath was placed.  The 018 wire was removed and a benson wire was placed.  The micropuncture sheath was exchanged for a 5 french sheath.  An omniflush catheter was advanced over the wire to the level of L-1.  An abdominal angiogram was obtained.  Next, using the omniflush catheter and a benson wire, the aortic bifurcation was crossed and the catheter was placed into theleft external iliac artery and left runoff was obtained.  right runoff was performed via retrograde sheath injections. Being that the patient has tissue loss on the left lower extremity, I elected to try anterior tibial artery recanalization.  The patient was heparinized.  A 6 x 65 cm sheath was brought onto the field and parked in the superficial femoral artery, from this location, a series of wires and catheters were used to select the anterior tibial artery, traverse the artery, and into the dorsalis pedis in the foot.  True lumen was confirmed with use of angiography.  Next, 0.9 mm laser atherectomy was brought onto the field and an effort to create a larger lumen.  This passed  without issue.  Next, a 2.5 x 24m balloon was brought onto the field and expanded from the dorsalis pedis, into the below-knee popliteal artery.  On follow-up angiography, there was no filling of the anterior tibial artery.  The area was ballooned again with a noncompliant J balloon, and again, there was no filling of the anterior tibial artery.  There did not appear to be any thrombus in the lumen, my concern was for dissection flap.  Being that the patient had inline flow to the foot, I elected not to stent the proximal anterior tibial artery. Impression: Unsuccessful attempt at recanalization of the anterior tibial artery using laser atherectomy, balloon angioplasty. Patient with single-vessel posterior tibial artery runoff to the foot JCassandria Santee MD [facility] GKennedaleOffice: [phone]  DG Chest Port 1 View  Result Date: 06/15/2021 CLINICAL DATA:  Orthopnea. EXAM: PORTABLE CHEST 1 VIEW COMPARISON:  06/07/2021. FINDINGS: The heart is enlarged the mediastinal contour stable. Atherosclerotic calcification of the aorta is noted. Lung volumes are low with mild residual airspace opacities at the lung bases. Subsegmental atelectasis is noted in the mid left lung. No effusion or pneumothorax. No acute osseous abnormality. IMPRESSION: 1. Mild residual airspace opacities at the lung bases, possible edema, atelectasis, or infiltrate. 2. Cardiomegaly. Electronically Signed   By: LBrett FairyM.D.   On: 06/15/2021 00:52   DG CHEST PORT 1 VIEW  Result Date: 06/07/2021 CLINICAL DATA:  Shortness of breath. EXAM: PORTABLE CHEST 1 VIEW COMPARISON:  06/06/2021 FINDINGS: Lungs are hypoinflated with hazy perihilar and bibasilar opacification unchanged. This may be due to edema versus infection. Possible left pleural effusion unchanged. Stable cardiomegaly. Remainder of the exam is unchanged. IMPRESSION: 1. Stable hazy perihilar and bibasilar opacification which may be due to edema versus  infection. Possible left pleural effusion unchanged. 2. Stable cardiomegaly. Electronically Signed   By: DMarin OlpM.D.   On: 06/07/2021 14:42   DG CHEST PORT 1 VIEW  Result Date: 06/06/2021 CLINICAL DATA:  Osteomyelitis. EXAM: PORTABLE CHEST 1 VIEW COMPARISON:  06/04/2021 FINDINGS: Lungs are adequately inflated and demonstrate mild linear  density over the left midlung likely atelectasis. Hazy prominence of the pulmonary vasculature without change which may be due to mild vascular congestion/edema. Hazy left base/retrocardiac density unchanged and may be due to atelectasis or layering pleural fluid. Stable cardiomegaly. Remainder of the exam is unchanged. IMPRESSION: Stable cardiomegaly with suggestion of mild vascular congestion/edema. Stable hazy left base/retrocardiac density which may be due to atelectasis or layering pleural fluid. Electronically Signed   By: Marin Olp M.D.   On: 06/06/2021 08:59   DG Foot 2 Views Left  Result Date: 05/25/2021 CLINICAL DATA:  Osteomyelitis of fifth digit. EXAM: LEFT FOOT - 2 VIEW COMPARISON:  None. FINDINGS: There is soft tissue swelling and air in the region of the fourth and fifth metatarsals, fourth proximal phalanx, and throughout the fifth toe worrisome for infection. There is fragmentation which may represent erosion versus fracture involving the base of the fifth distal phalanx in the distal aspect of the fifth proximal phalanx. This appears nondisplaced. No dislocation. There is likely a healed fracture of the fifth proximal metatarsal. Peripheral vascular calcifications are present. IMPRESSION: 1. Soft tissue swelling and air involving the fourth and fifth metatarsals and phalanges worrisome for infection. 2. Fragmentation secondary to osteomyelitis versus fracture involving the fifth proximal and distal phalanx. Please correlate clinically. Electronically Signed   By: Ronney Asters M.D.   On: 05/25/2021 20:33   EEG adult  Result Date:  06/08/2021 Lora Havens, MD     06/08/2021  3:00 PM Patient Name: [name] MRN: [id number] Epilepsy Attending: Lora Havens Referring Physician/Provider: Elodia Florence., MD Date: 06/08/2021 Duration: 21.30 mins Patient history: 56 year old female with altered mental status.  EEG to evaluate for seizure. Level of alertness: lethargic AEDs during EEG study: None Technical aspects: This EEG study was done with scalp electrodes positioned according to the 10-20 International system of electrode placement. Electrical activity was acquired at a sampling rate of 500Hz  and reviewed with a high frequency filter of 70Hz  and a low frequency filter of 1Hz . EEG data were recorded continuously and digitally stored. Description: No posterior dominant rhythm was seen. EEG showed continuous generalized 3 to 6 Hz theta-delta slowing. Hyperventilation and photic stimulation were not performed.   ABNORMALITY - Continuous slow, generalized IMPRESSION: This study is suggestive of moderate diffuse encephalopathy, nonspecific etiology. No seizures or epileptiform discharges were seen throughout the recording. Priyanka O Yadav   VAS [place] ABI WITH/WO TBI  Result Date: 05/26/2021  LOWER EXTREMITY DOPPLER STUDY Patient Name:  LEEAN AMEZCUA  Date of Exam:   05/26/2021 Medical Rec #: [id number]           Accession #:    [id number] Date of Birth: [birth year]            Patient Gender: F Patient Age:   30 years Exam Location:  [facility] Procedure:      VAS [place] ABI WITH/WO TBI Referring Phys: MARCUS DUDA --------------------------------------------------------------------------------  Indications: PVD w/ gangrene LE left. High Risk         Hypertension, hyperlipidemia, Diabetes, coronary artery Factors:          disease.  Comparison Study: no prior Performing Technologist: Archie Patten RVS  Examination Guidelines: A complete evaluation includes at minimum, Doppler waveform signals and systolic blood pressure  reading at the level of bilateral brachial, anterior tibial, and posterior tibial arteries, when vessel segments are accessible. Bilateral testing is considered an integral part of a complete examination. Photoelectric Plethysmograph (PPG) waveforms  and toe systolic pressure readings are included as required and additional duplex testing as needed. Limited examinations for reoccurring indications may be performed as noted.  ABI Findings: +---------+------------------+-----+----------+--------------------------------+ Right    Rt Pressure (mmHg)IndexWaveform  Comment                          +---------+------------------+-----+----------+--------------------------------+ Brachial                                  fistula                          +---------+------------------+-----+----------+--------------------------------+ PTA      79                0.70 monophasic                                 +---------+------------------+-----+----------+--------------------------------+ DP       255               2.26 monophasic                                 +---------+------------------+-----+----------+--------------------------------+ Great Toe                                 unable to obtain pressure due to                                           decreased toe amplitude          +---------+------------------+-----+----------+--------------------------------+ +---------+------------------+-----+-------------------+-------+ Left     Lt Pressure (mmHg)IndexWaveform           Comment +---------+------------------+-----+-------------------+-------+ Brachial 113                    triphasic                  +---------+------------------+-----+-------------------+-------+ PTA      92                0.81 monophasic                 +---------+------------------+-----+-------------------+-------+ DP       91                0.81 dampened monophasic         +---------+------------------+-----+-------------------+-------+ Great Toe26                0.23 Abnormal                   +---------+------------------+-----+-------------------+-------+ +-------+-----------+-----------+------------+------------+ ABI/TBIToday's ABIToday's TBIPrevious ABIPrevious TBI +-------+-----------+-----------+------------+------------+ Right  2.25                                           +-------+-----------+-----------+------------+------------+ Left   0.81       0.23                                +-------+-----------+-----------+------------+------------+  Arterial wall calcification precludes accurate ankle pressures and ABIs.  Summary: Right: Resting right ankle-brachial index indicates noncompressible right lower extremity arteries. Left: Resting left ankle-brachial index indicates mild left lower extremity arterial disease. The left toe-brachial index is abnormal. ABI's may be unreliable due to due calcification. *See table(s) above for measurements and observations.  Electronically signed by Servando Snare MD on 05/26/2021 at 4:04:17 PM.    Final    ECHOCARDIOGRAM COMPLETE  Result Date: 06/05/2021    ECHOCARDIOGRAM REPORT   Patient Name:   YAILENE BADIA Date of Exam: 06/05/2021 Medical Rec #:  [id number]          Height:       68.0 in Accession #:    [id number]         Weight:       248.2 lb Date of Birth:  May 19, 1965           BSA:          2.240 m Patient Age:    60 years           BP:           181/159 mmHg Patient Gender: F                  HR:           105 bpm. Exam Location:  Inpatient Procedure: 2D Echo, Cardiac Doppler, Color Doppler and Intracardiac            Opacification Agent Indications:    Atrial fibrillation  History:        Patient has prior history of Echocardiogram examinations, most                 recent 11/25/2020. CAD; Risk Factors:Hypertension, Diabetes and                 Dyslipidemia. ESRD.  Sonographer:    Clayton Lefort RDCS (AE)  Referring Phys: [phone] A CALDWELL POWELL JR  Sonographer Comments: Technically challenging study due to limited acoustic windows, Technically difficult study due to poor echo windows and patient is morbidly obese. Image acquisition challenging due to patient body habitus. Extremely challenging exam. 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 Patient moving thoughout exam. IMPRESSIONS  1. Left ventricular ejection fraction, by estimation, is 60 to 65%. The left ventricle has normal function. The left ventricle has no regional wall motion abnormalities. There is moderate left ventricular hypertrophy. Left ventricular diastolic function  could not be evaluated. There is the interventricular septum is flattened in systole, consistent with right ventricular pressure overload.  2. Right ventricular systolic function is mildly reduced. The right ventricular size is mildly enlarged. There is severely elevated pulmonary artery systolic pressure.  3. The mitral valve is grossly normal. Mild mitral valve regurgitation.  4. Tricuspid valve regurgitation is moderate.  5. Aortic valve regurgitation is not visualized.  6. Aortic no significant aortic aneurysm. Conclusion(s)/Recommendation(s): RVSP has increased from prior study. FINDINGS  Left Ventricle: Left ventricular ejection fraction, by estimation, is 60 to 65%. The left ventricle has normal function. The left ventricle has no regional wall motion abnormalities. Definity contrast agent was given IV to delineate the left ventricular  endocardial borders. The left ventricular internal cavity size was normal in size. There is moderate left ventricular hypertrophy. The interventricular septum is flattened in systole, consistent with right ventricular pressure overload. Left ventricular  diastolic function could not be evaluated. Right Ventricle: The right ventricular size  is mildly enlarged. Right ventricular systolic function is mildly  reduced. There is severely elevated pulmonary artery systolic pressure. The tricuspid regurgitant velocity is 3.77 m/s, and with an assumed right atrial pressure of 15 mmHg, the estimated right ventricular systolic pressure is 56.2 mmHg. Left Atrium: Left atrial size was not well visualized. Right Atrium: Right atrial size was not well visualized. Pericardium: There is no evidence of pericardial effusion. Mitral Valve: The mitral valve is grossly normal. Mild mitral valve regurgitation. Tricuspid Valve: The tricuspid valve is grossly normal. Tricuspid valve regurgitation is moderate. Aortic Valve: Aortic valve regurgitation is not visualized. Aortic valve mean gradient measures 9.0 mmHg. Aortic valve peak gradient measures 17.3 mmHg. Aortic valve area, by VTI measures 1.73 cm. Pulmonic Valve: Pulmonic valve regurgitation is not visualized. Aorta: No significant aortic aneurysm. IAS/Shunts: The interatrial septum was not well visualized.  LEFT VENTRICLE PLAX 2D LVIDd:         4.30 cm LVIDs:         2.30 cm LV PW:         1.70 cm LV IVS:        1.20 cm LVOT diam:     2.00 cm LV SV:         53 LV SV Index:   24 LVOT Area:     3.14 cm  RIGHT VENTRICLE             IVC RV Basal diam:  3.70 cm     IVC diam: 3.20 cm RV Mid diam:    4.10 cm RV S prime:     13.60 cm/s TAPSE (M-mode): 1.7 cm LEFT ATRIUM           Index        RIGHT ATRIUM           Index LA diam:      4.50 cm 2.01 cm/m   RA Area:     20.20 cm LA Vol (A2C): 60.7 ml 27.09 ml/m  RA Volume:   56.80 ml  25.35 ml/m LA Vol (A4C): 65.9 ml 29.42 ml/m  AORTIC VALVE AV Area (Vmax):    1.60 cm AV Area (Vmean):   1.68 cm AV Area (VTI):     1.73 cm AV Vmax:           208.00 cm/s AV Vmean:          140.000 cm/s AV VTI:            0.308 m AV Peak Grad:      17.3 mmHg AV Mean Grad:      9.0 mmHg LVOT Vmax:         106.00 cm/s LVOT Vmean:        75.000 cm/s LVOT VTI:          0.170 m LVOT/AV VTI ratio: 0.55  AORTA Ao Root diam: 3.10 cm Ao Asc diam:  3.80 cm TRICUSPID  VALVE TR Peak grad:   56.9 mmHg TR Vmax:        377.00 cm/s  SHUNTS Systemic VTI:  0.17 m Systemic Diam: 2.00 cm Phineas Inches Electronically signed by Phineas Inches Signature Date/Time: 06/05/2021/5:17:19 PM    Final    [place] EKG SITE RITE  Result Date: 05/29/2021 If Site Rite image not attached, placement could not be confirmed due to current cardiac rhythm.     Subjective:  She denies any complaints, no significant events overnight Discharge Exam: Vitals:   06/18/21 0734 06/18/21 1149  BP:  92/75 118/75  Pulse: 91 83  Resp: 17 (!) 21  Temp: 98 F (36.7 C) 98.2 F (36.8 C)  SpO2: 90% 97%   Vitals:   06/18/21 0331 06/18/21 0424 06/18/21 0734 06/18/21 1149  BP: 118/62  92/75 118/75  Pulse: 100  91 83  Resp: 20  17 (!) 21  Temp: 98.2 F (36.8 C)  98 F (36.7 C) 98.2 F (36.8 C)  TempSrc:   Oral Oral  SpO2: 92%  90% 97%  Weight:  117.4 kg    Height:        General: Pt is alert, awake, not in acute distress Cardiovascular: RRR, S1/S2 +, no rubs, no gallops Respiratory: CTA bilaterally, no wheezing, no rhonchi Abdominal: Soft, NT, ND, bowel sounds + Extremities: no edema, no cyanosis, left BKA, with staples present, wound intact.    The results of significant diagnostics from this hospitalization (including imaging, microbiology, ancillary and laboratory) are listed below for reference.     Microbiology: Recent Results (from the past 240 hour(s))  MRSA Next Gen by PCR, Nasal     Status: None   Collection Time: 06/15/21  4:51 AM   Specimen: Nasal Mucosa; Nasal Swab  Result Value Ref Range Status   MRSA by PCR Next Gen NOT DETECTED NOT DETECTED Final    Comment: (NOTE) The GeneXpert MRSA Assay (FDA approved for NASAL specimens only), is one component of a comprehensive MRSA colonization surveillance program. It is not intended to diagnose MRSA infection nor to guide or monitor treatment for MRSA infections. Test performance is not FDA approved in patients less than 7  years old. Performed at [facility], [place] [address], [place], [place] [address]      Labs: BNP (last 3 results) No results for input(s): BNP in the last 8760 hours. Basic Metabolic Panel: Recent Labs  Lab 06/15/21 0036 06/15/21 0237 06/15/21 0307 06/15/21 0308 06/15/21 0510 06/16/21 0436 06/18/21 0354  NA 129*   < > 129* 129* 131* 126* 129*  K 5.9*   < > 4.7 4.7 4.9 5.3* 4.5  CL 90*  --  91*  --  91* 86* 93*  CO2 23  --   --   --  23 23 23   GLUCOSE 77  --  81  --  73 167* 125*  BUN 57*  --  63*  --  59* 74* 37*  CREATININE 6.50*  --  7.60*  --  6.98* 8.09* 5.35*  CALCIUM 8.2*  --   --   --  8.4* 8.1* 8.2*   < > = values in this interval not displayed.   Liver Function Tests: Recent Labs  Lab 06/15/21 0036  AST 38  ALT 29  ALKPHOS 150*  BILITOT 1.6*  PROT 7.4  ALBUMIN 2.9*   No results for input(s): LIPASE, AMYLASE in the last 168 hours. No results for input(s): AMMONIA in the last 168 hours. CBC: Recent Labs  Lab 06/15/21 0036 06/15/21 0237 06/15/21 0307 06/15/21 0308 06/16/21 0436 06/18/21 0354  WBC 7.0  --   --   --  7.6 6.3  NEUTROABS 4.8  --   --   --   --   --   HGB 9.0* 9.9* 9.9* 9.2* 8.4* 8.3*  HCT 28.8* 29.0* 29.0* 27.0* 26.8* 26.8*  MCV 98.0  --   --   --  97.1 97.1  PLT 203  --   --   --  196 222   Cardiac Enzymes: No  results for input(s): CKTOTAL, CKMB, CKMBINDEX, TROPONINI in the last 168 hours. BNP: Invalid input(s): POCBNP CBG: Recent Labs  Lab 06/17/21 1929 06/17/21 2310 06/18/21 0330 06/18/21 0738 06/18/21 1151  GLUCAP 121* 139* 129* 131* 107*   D-Dimer No results for input(s): DDIMER in the last 72 hours. Hgb A1c No results for input(s): HGBA1C in the last 72 hours. Lipid Profile No results for input(s): CHOL, HDL, LDLCALC, TRIG, CHOLHDL, LDLDIRECT in the last 72 hours. Thyroid function studies Recent Labs    06/16/21 0751  TSH 2.742   Anemia work up No results for input(s): VITAMINB12, FOLATE, FERRITIN,  TIBC, IRON, RETICCTPCT in the last 72 hours. Urinalysis    Component Value Date/Time   COLORURINE YELLOW 07/22/2011 1526   APPEARANCEUR CLOUDY (A) 07/22/2011 1526   LABSPEC 1.022 07/22/2011 1526   PHURINE 5.5 07/22/2011 1526   GLUCOSEU 100 (A) 07/22/2011 1526   HGBUR MODERATE (A) 07/22/2011 1526   BILIRUBINUR NEGATIVE 07/22/2011 1526   BILIRUBINUR NEG 07/19/2011 1202   KETONESUR NEGATIVE 07/22/2011 1526   PROTEINUR >300 (A) 07/22/2011 1526   UROBILINOGEN 0.2 07/22/2011 1526   NITRITE NEGATIVE 07/22/2011 1526   LEUKOCYTESUR TRACE (A) 07/22/2011 1526   Sepsis Labs Invalid input(s): PROCALCITONIN,  WBC,  LACTICIDVEN Microbiology Recent Results (from the past 240 hour(s))  MRSA Next Gen by PCR, Nasal     Status: None   Collection Time: 06/15/21  4:51 AM   Specimen: Nasal Mucosa; Nasal Swab  Result Value Ref Range Status   MRSA by PCR Next Gen NOT DETECTED NOT DETECTED Final    Comment: (NOTE) The GeneXpert MRSA Assay (FDA approved for NASAL specimens only), is one component of a comprehensive MRSA colonization surveillance program. It is not intended to diagnose MRSA infection nor to guide or monitor treatment for MRSA infections. Test performance is not FDA approved in patients less than 57 years old. Performed at [facility], [place] [address], [place], [place] [address]      Time coordinating discharge: Over 30 minutes  SIGNED:   Phillips Climes, MD  [facility] 06/18/2021, 3:21 PM Pager   If 7PM-7AM, please contact night-coverage [email or web address] Password [email or web address] dis

## 2021-06-18 NOTE — Discharge Instructions (Signed)
Follow with Primary MD Sandi Mariscal, MD/ SNF physician  Get CBC, CMP, checked  by Primary MD next visit.    Activity: As tolerated with Full fall precautions use walker/cane & assistance as needed   Disposition SNF   Diet: renal/carb modified  On your next visit with your primary care physician please Get Medicines reviewed and adjusted.   Please request your Prim.MD to go over all Hospital Tests and Procedure/Radiological results at the follow up, please get all Hospital records sent to your Prim MD by signing hospital release before you go home.   If you experience worsening of your admission symptoms, develop shortness of breath, life threatening emergency, suicidal or homicidal thoughts you must seek medical attention immediately by calling 911 or calling your MD immediately  if symptoms less severe.  You Must read complete instructions/literature along with all the possible adverse reactions/side effects for all the Medicines you take and that have been prescribed to you. Take any new Medicines after you have completely understood and accpet all the possible adverse reactions/side effects.   Do not drive, operating heavy machinery, perform activities at heights, swimming or participation in water activities or provide baby sitting services if your were admitted for syncope or siezures until you have seen by Primary MD or a Neurologist and advised to do so again.  Do not drive when taking Pain medications.    Do not take more than prescribed Pain, Sleep and Anxiety Medications  Special Instructions: If you have smoked or chewed Tobacco  in the last 2 yrs please stop smoking, stop any regular Alcohol  and or any Recreational drug use.  Wear Seat belts while driving.   Please note  You were cared for by a hospitalist during your hospital stay. If you have any questions about your discharge medications or the care you received while you were in the hospital after you are discharged,  you can call the unit and asked to speak with the hospitalist on call if the hospitalist that took care of you is not available. Once you are discharged, your primary care physician will handle any further medical issues. Please note that NO REFILLS for any discharge medications will be authorized once you are discharged, as it is imperative that you return to your primary care physician (or establish a relationship with a primary care physician if you do not have one) for your aftercare needs so that they can reassess your need for medications and monitor your lab values.

## 2021-06-18 NOTE — Progress Notes (Signed)
Patient seen and examined this afternoon Recent left lower extremity below-knee amputation.  Concerns at the time of the operation due to poor perfusion. Wound appears to be healing, however the midline, there is drainage.  This is clear, but there appears to be some nonunion.  Unknown Foley sits in the dependent position for most the day and has baseline fluid overload.  She has not been wearing compression to the BKA stump.  I had a long conversation with her and her caregiver.  She would benefit from daily dressing changes with Ace wrap compression in an effort to remove edema within the below-knee amputation stump.  She is aware that, that should this become infected, it would likely result in an above-knee amputation.  Being that there is some drainage, I have reached out to my office to see her again in 2 weeks for a close follow-up wound check.  I also asked for a short course of oral antibiotics as drainage usually leads to infection.  Broadus John MD

## 2021-06-18 NOTE — Progress Notes (Signed)
[facility] Progress Note   Subjective:   Patient seen and examined at bedside in dialysis.  Feeling back to baseline.  Only concern is small amount of bleeding from stump.  Denies CP, SOB, abdominal pain and n/v/d.   Objective Vitals:   06/18/21 0331 06/18/21 0424 06/18/21 0734 06/18/21 1149  BP: 118/62  92/75 118/75  Pulse: 100  91 83  Resp: 20  17 (!) 21  Temp: 98.2 F (36.8 C)  98 F (36.7 C) 98.2 F (36.8 C)  TempSrc:   Oral Oral  SpO2: 92%  90% 97%  Weight:  117.4 kg    Height:       Physical Exam General:chronically ill appearing female in NAD Heart:RRR, no mrg Lungs:CTAB, nml WOB on RA Abdomen:soft, NTND Extremities:L BKA w/dressing in place, no LE edema Dialysis Access: RU AVF +b/t  Filed Weights   06/17/21 0140 06/17/21 1000 06/18/21 0424  Weight: 120.6 kg 118 kg 117.4 kg    Intake/Output Summary (Last 24 hours) at 06/18/2021 1223 Last data filed at 06/17/2021 2240 Gross per 24 hour  Intake 160 ml  Output --  Net 160 ml    Additional Objective Labs: Basic Metabolic Panel: Recent Labs  Lab 06/15/21 0510 06/16/21 0436 06/18/21 0354  NA 131* 126* 129*  K 4.9 5.3* 4.5  CL 91* 86* 93*  CO2 '23 23 23  '$ GLUCOSE 73 167* 125*  BUN 59* 74* 37*  CREATININE 6.98* 8.09* 5.35*  CALCIUM 8.4* 8.1* 8.2*   Liver Function Tests: Recent Labs  Lab 06/15/21 0036  AST 38  ALT 29  ALKPHOS 150*  BILITOT 1.6*  PROT 7.4  ALBUMIN 2.9*   CBC: Recent Labs  Lab 06/15/21 0036 06/15/21 0237 06/15/21 0308 06/16/21 0436 06/18/21 0354  WBC 7.0  --   --  7.6 6.3  NEUTROABS 4.8  --   --   --   --   HGB 9.0*   < > 9.2* 8.4* 8.3*  HCT 28.8*   < > 27.0* 26.8* 26.8*  MCV 98.0  --   --  97.1 97.1  PLT 203  --   --  196 222   < > = values in this interval not displayed.     CBG: Recent Labs  Lab 06/17/21 1929 06/17/21 2310 06/18/21 0330 06/18/21 0738 06/18/21 1151  GLUCAP 121* 139* 129* 131* 107*    Medications:  sodium chloride Stopped  (06/16/21 0900)    apixaban  5 mg Oral BID   calcitRIOL  2.5 mcg Oral Q M,W,F-HD   Chlorhexidine Gluconate Cloth  6 each Topical Q0600   insulin aspart  0-15 Units Subcutaneous TID WC   insulin aspart  0-5 Units Subcutaneous QHS   mouth rinse  15 mL Mouth Rinse BID   midodrine  10 mg Oral Q8H   pantoprazole  40 mg Oral QHS   pramipexole  0.5 mg Oral TID   rosuvastatin  10 mg Oral QHS    Dialysis Orders: MWF GO  4h  450/800  110.6kg    2/2.5 bath  RUA AVF  Heparin 10000 bolus - last HD 5/12 came off 5.3kg up - last Hb 8.6 on 5/12 - rocaltrol 2.5ug po tiw - parsabiv 7.'5mg'$  IV tiw   Assessment/ Plan: AMS - opioid use vs SDH, a little better today SDH - follow up CT head reviewed by neurosurgery - no acute hemorrhage noted, appears chronic & favors venous structure,  Can restart eliquis tomorrow.  ESRD - on  HD MWF. HD tomorrow per regular schedule.  Volume overload/ pulm edema on CXR -  Hx of large gains, Up 8-9kg on admit, ~5L removed.  No signs of respiratory distress, continue UF as tolerated with HD tomorrow.  Afib / RVR - to restart eliquis tomorrow.  Metoprolol on hold.  Hypotension - midodrine started.  Metoprolol on hold.  Hyperkalemia - resolved.  Anemia ckd - Hb 8.3. Not on esa due to concern for possible malignancy - renal mass and pulmonary nodules. BMD ckd - CCa and phos in range, cont binder and po vdra while here Nutrition - renal diet w/fluid restictions Recent L BKA - per PMD. Wound care following DMT2 - per PMD Dispo - ok for d/c from renal standpoint    [name], PA-C [facility] 06/18/2021,12:23 PM  LOS: 3 days

## 2021-06-18 NOTE — Progress Notes (Signed)
Physical Therapy Treatment Patient Details Name: [name] MRN: [id number] DOB: 1965-04-07 Today's Date: 06/18/2021   History of Present Illness 56 y.o. female presented 06/15/21 from [facility] SN[facility] with AMS due to small subacute SDH vs opiate use. Hospital course complicated by hypotension requiring Levophed.  Per neuro MD 5/17: The hyperdensity that was visualized on the initial CT appears unchanged on the follow up CT. There is no acute hemorrhage appreciated. This appears to be chronic and favor a venous structure. No additional neurosurgical recommendations. 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    PT Comments    Pt received seated EOB, lethargic and nodding off but agreeable to therapy session with encouragement. Pt motivated to progress strength/endurance despite fatigue (from reported poor sleep in hospital -noted no CPAP in her room) and with good effort for multiple trials to squat at bedside with Stedy standing frame for UE/LE strengthening, seated LE exercises and lateral scooting at bedside. Pt reports 8/10 modified RPE at end of session. VSS on 2.5L O2  aside from elevated HR with exertion, RN notified. Increased drainage to dressing at distal end of L residual limb. Pt continues to benefit from PT services to progress toward functional mobility goals.   Recommendations for follow up therapy are one component of a multi-disciplinary discharge planning process, led by the attending physician.  Recommendations may be updated based on patient status, additional functional criteria and insurance authorization.  Follow Up Recommendations  Skilled nursing-short term rehab (<3 hours/day) (To complete rehab course)     Assistance Recommended at Discharge Frequent or constant Supervision/Assistance  Patient can return home with the following Two people to help with walking and/or  transfers;A lot of help with bathing/dressing/bathroom;Assistance with cooking/housework;Assist for transportation;Help with stairs or ramp for entrance   Equipment Recommendations  BSC/3in1;Rolling walker (2 wheels);Other (comment) (drop arm BSC, current RW is broken)    Recommendations for Other Services       Precautions / Restrictions Precautions Precautions: Fall Precaution Comments: new L BKA 06/02/21 Other Brace: pt was DC with limb guard previous admission but not found in room, she thinks it is at home. Restrictions Weight Bearing Restrictions: Yes LLE Weight Bearing: Non weight bearing     Mobility  Bed Mobility Overal bed mobility: Needs Assistance Bed Mobility: Supine to Sit, Sit to Supine, Rolling, Sidelying to Sit Rolling: Min guard Sidelying to sit: Min guard Supine to sit: Min guard, HOB elevated Sit to supine: Min guard   General bed mobility comments: increased verbal and some tactile cues for technique and for attention to task due to her increased drowsiness; x2 reps during session as pt too drowsy to remain sitting upright when PTA left room to get Stedy.    Transfers Overall transfer level: Needs assistance Equipment used: Rolling walker (2 wheels) Transfers: Sit to/from Stand, Bed to chair/wheelchair/BSC Sit to Stand: Max assist, Total assist, From elevated surface          Lateral/Scoot Transfers: Mod assist General transfer comment: unable to clear hips from elevated bed to stand, pt demonstrated ability to laterally scoot along EOB with up to modA, but fatigues easily. Pt scooted ~1.5 ft total with rest breaks. She performed x5 partial squats at bedside with Stedy (lifting hips 1-2" off bed) but unable to fully extend hips/R knee without +2 assist. Transfer via Lift Equipment: Stedy  Ambulation/Gait  Stairs             Wheelchair Mobility    Modified Rankin (Stroke Patients Only)       Balance Overall  balance assessment: Needs assistance Sitting-balance support: No upper extremity supported Sitting balance-Leahy Scale:  (Fair to Good but more due to lethargy than due to balance deficit) Sitting balance - Comments: pt frequently falling asleep and leaning backward when not awakened, otherwise pt able to maintain upright posture unsupported Postural control: Posterior lean   Standing balance-Leahy Scale: Zero Standing balance comment: unable to clear hips from EOB with +1 assist                            Cognition Arousal/Alertness: Lethargic Behavior During Therapy: WFL for tasks assessed/performed Overall Cognitive Status: Within Functional Limits for tasks assessed                                 General Comments: Pt falling asleep sitting EOB on PTA arrival to room. Pt following commands when therapist speaks to her in moderate to loud volume but frequently falling asleep due to drowsiness. Pt states she is eager to progress strength/endurance when asked what her goals are.        Exercises Amputee Exercises Hip Flexion/Marching: AROM, Both, 10 reps, Seated Knee Extension: AROM, Both, 10 reps, Seated (L knee ext, R LAQ seated EOB) Other Exercises Other Exercises: squats x 5 reps from elevated bed height to Stedy (lifting hips 1-2" each rep) Other Exercises: lateral leans with elbow taps x5 reps each direction    General Comments General comments (skin integrity, edema, etc.): BP stable supine and seated (entered into vitals flowsheet) and SpO2 WFL on 2.5L O2 [place], HR elevated to 128 bpm with exertion/seated transfer training, RN notified. Noted Afib/PVCs per tele but pt denies acute s/sx distress      Pertinent Vitals/Pain Pain Assessment Pain Assessment: Faces Faces Pain Scale: Hurts a little bit Pain Location: L residual limb Pain Descriptors / Indicators: Grimacing, Discomfort Pain Intervention(s): Monitored during session, Repositioned    Home  Living                          Prior Function            PT Goals (current goals can now be found in the care plan section) Acute Rehab PT Goals Patient Stated Goal: to get stronger PT Goal Formulation: With patient Time For Goal Achievement: 07/01/21 Progress towards PT goals: Progressing toward goals    Frequency    Min 3X/week      PT Plan Current plan remains appropriate    Co-evaluation              AM-PAC PT "6 Clicks" Mobility   Outcome Measure  Help needed turning from your back to your side while in a flat bed without using bedrails?: A Little Help needed moving from lying on your back to sitting on the side of a flat bed without using bedrails?: A Little Help needed moving to and from a bed to a chair (including a wheelchair)?: A Lot (mod cues) Help needed standing up from a chair using your arms (e.g., wheelchair or bedside chair)?: Total Help needed to walk in hospital room?: Total Help needed climbing 3-5 steps with a railing? : Total 6 Click Score: 11  End of Session Equipment Utilized During Treatment: Oxygen Activity Tolerance: Patient tolerated treatment well (despite drowsiness) Patient left: in bed;with call bell/phone within reach;with bed alarm set (bed in chair posture and blinds open to encourage wakefulness) Nurse Communication: Mobility status;Other (comment) (can perform scoot transfer with +1 assist, increased drainage to L residual limb dressing, foam dressing on tailbone came off while pt scooting it was from 3 days ago she needs a new one) PT Visit Diagnosis: Unsteadiness on feet (R26.81);Muscle weakness (generalized) (M62.81);History of falling (Z91.81);Difficulty in walking, not elsewhere classified (R26.2)     Time: 9470-9628 PT Time Calculation (min) (ACUTE ONLY): 32 min  Charges:  $Therapeutic Exercise: 8-22 mins $Therapeutic Activity: 8-22 mins                     Dannica Bickham P., PTA Acute Rehabilitation  Services Secure Chat Preferred 9a-5:30pm Office: [place] 06/18/2021, 3:03 PM

## 2021-06-18 NOTE — Care Management Important Message (Signed)
Important Message  Patient Details  Name: [name] MRN: [id number] Date of Birth: 05-21-1965   Medicare Important Message Given:  Yes     Orbie Pyo 06/18/2021, 2:58 PM

## 2021-06-18 NOTE — Progress Notes (Signed)
Pt to d/c to snf today. Contacted Emilie Rutter to advise clinic of pt's d/c today and that pt will resume care tomorrow.   Melven Sartorius Renal Navigator [phone]

## 2021-06-18 NOTE — TOC Transition Note (Addendum)
Transition of Care [facility]) - CM/SW Discharge Note   Patient Details  Name: [name] MRN: [id number] Date of Birth: 23-Oct-1965  Transition of Care [facility]) CM/SW Contact:  Benard Halsted, LCSW Phone Number: 06/18/2021, 1:10 PM   Clinical Narrative:    [name] receivedJosem Kaufmann ID# [id number], effective 06/18/21-06/22/21.  Patient will DC to: [facility] Anticipated DC date: 06/18/21 Family notified: Son, Vonna Kotyk Transport by: Sharl Ma   Per MD patient ready for DC to [facility]. RN to call report prior to discharge [phone] room 306A). RN, patient, patient's family, and facility notified of DC. Discharge Summary and FL2 sent to facility. DC packet on chart including signed script. Ambulance transport requested for patient.   CSW will sign off for now as social work intervention is no longer needed. Please consult [place] again if new needs arise.     Final next level of care: Skilled Nursing Facility Barriers to Discharge: Barriers Resolved   Patient Goals and CMS Choice Patient states their goals for this hospitalization and ongoing recovery are:: Return to snf CMS Medicare.gov Compare Post Acute Care list provided to:: Patient Represenative (must comment) Choice offered to / list presented to : Adult Children  Discharge Placement   Existing PASRR number confirmed : 06/18/21          Patient chooses bed at: [facility] Patient to be transferred to facility by: [facility] Name of family member notified: Son Patient and family notified of of transfer: 06/18/21  Discharge Plan and Services In-house Referral: Clinical Social Work   Post Acute Care Choice: [place]                               Social Determinants of Health (SDOH) Interventions     Readmission Risk Interventions    06/16/2021    2:19 PM 02/28/2020    1:08 PM  Readmission Risk Prevention Plan  Transportation Screening Complete Complete  PCP or  Specialist Appt within 5-7 Days  Complete  Home Care Screening  Complete  Medication Review (RN CM)  Complete  Medication Review (RN Care Manager) Referral to Pharmacy   PCP or Specialist appointment within 3-5 days of discharge Complete   HRI or Home Care Consult Complete   SW Recovery Care/Counseling Consult Complete   Palliative Care Screening Not [place] Complete

## 2021-06-19 ENCOUNTER — Encounter: Payer: Self-pay | Admitting: Adult Health

## 2021-06-19 ENCOUNTER — Non-Acute Institutional Stay (SKILLED_NURSING_FACILITY): Payer: Medicare Other | Admitting: Adult Health

## 2021-06-19 DIAGNOSIS — Z794 Long term (current) use of insulin: Secondary | ICD-10-CM

## 2021-06-19 DIAGNOSIS — G9340 Encephalopathy, unspecified: Secondary | ICD-10-CM | POA: Diagnosis not present

## 2021-06-19 DIAGNOSIS — I878 Other specified disorders of veins: Secondary | ICD-10-CM

## 2021-06-19 DIAGNOSIS — I4891 Unspecified atrial fibrillation: Secondary | ICD-10-CM

## 2021-06-19 DIAGNOSIS — N186 End stage renal disease: Secondary | ICD-10-CM

## 2021-06-19 DIAGNOSIS — I6200 Nontraumatic subdural hemorrhage, unspecified: Secondary | ICD-10-CM

## 2021-06-19 DIAGNOSIS — E1122 Type 2 diabetes mellitus with diabetic chronic kidney disease: Secondary | ICD-10-CM

## 2021-06-19 DIAGNOSIS — Z992 Dependence on renal dialysis: Secondary | ICD-10-CM

## 2021-06-19 DIAGNOSIS — F419 Anxiety disorder, unspecified: Secondary | ICD-10-CM

## 2021-06-19 NOTE — Progress Notes (Unsigned)
Location:  [place] Room Number: 306-A Place of Service:  SNF (31) Provider:  Durenda Age, DNP, FNP-BC  Patient Care Team: Sandi Mariscal, MD as PCP - General (Internal Medicine) Minus Breeding, MD as PCP - Cardiology (Cardiology) Leandrew Koyanagi, MD as Attending Physician (Orthopedic Surgery) [facility]  Extended Emergency Contact Information Primary Emergency Contact: [place] Phone: [phone] Mobile Phone: [phone] Relation: Son Secondary Emergency Contact: [facility] Address: [address]          [place], [place] [address] Johnnette Litter of [place] Phone: [phone] Mobile Phone: [phone] Relation: Sister  Code Status:  Full Code   Goals of care: Advanced Directive information    06/19/2021   10:11 AM  Advanced Directives  Does Patient Have a Medical Advance Directive? No  Would patient like information on creating a medical advance directive? No - Patient declined     Chief Complaint  Patient presents with   Hospitalization Follow-up    Follow up hospital admission 5/15 to 06/18/21    HPI:  Pt is a 56 y.o. female who was re-admitted to [facility] on 06/18/21 post hospital admission 06/15/21 to 06/18/21. 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 Of note, she was having a short-term rehabilitation at [facility] post hospital admission 05/25/21 to 06/11/21 for bacteremia, antiplatelet care for limb ischemia s/p left BKA when she was transferred to the hospital on 06/15/21  due to AMS. She reportedly left [facility] and came back with altered mental status. She was started on Narcan drip with response. Head CT obtained was concerning for a small 4 mm subdural hemorrhage.  Neurosurgery was consulted.  Patient was previously  on Eliquis and was reversed with Andexxa.  She was also found to be in atrial fibrillation with RVR and was started on diltiazem drip. Heart rate then has been controlled and she came off Narcan. Percocet 10-325 mg TID PRN was decreased to Oxycodone 5 mg Q 12 hours PRN. Neurosurgery thought that 4 mm subdural hemorrhage seen on CT head on 5/15 appear to be chronic and favored a venous structure. Eliquis was resumed on 5/18.  She was seen in the room today. DON notified NP regarding patient being so sleepy. She wakes up with verbal stimulations. She is for hemodialysis and was concerns whether she needs to be transferred to the ED or hemodialysis. After approximately 10 minutes she started to get agitated and asked "why are you guys harassing me? I have not slept for 3 days." She then proceeded to hemodialysis.   Past Medical History:  Diagnosis Date   Anaphylactic shock, unspecified, initial encounter 09/04/2018   Anemia    Anxiety    CAD (coronary artery disease)    Nonobstructive on CT 2019   CHF (congestive heart failure) ([place])    Chronic bilateral pleural effusions 10/23/2018   COVID-19 10/2018   COVID-19 virus detected 10/10/2018   COVID-19 virus infection 09/18/2018   ESRD (end stage renal disease) ([place])    Dialysis [facility]- 3rd st   Gangrene ([place]) 09/18/2018   Headache(784.0)    High cholesterol    History of blood transfusion    Hypertension    Mitral regurgitation    moderate to severe MR 10/2018 echo   Nerve pain    "they say I have L5 nerve damage; my lumbar"  Neuropathy 01/29/2011   04/2011 MRI L-spine:  L5-S1: Bulge/shallow broad-based protrusion greatest centrally and in the right posterior lateral position. Minimal crowding of the  upper aspect of the S1 nerve root greater on the right. Left lateral disc osteophyte with mild encroachment upon but not  significant compression of the exiting left L5 nerve root.   05/2011: Dr. Sherwood Gambler ([facility]): DJD and mild disc bu    Pericardial effusion    Pneumonia    ; 11/16/2018- "touch of pneumonia" - seen in ED- 11/14/2018- on antibiotic. Has had it x 2   Pneumonia 10/10/2018   PVD (peripheral vascular disease) ([place])    Right leg stent in [place].  (No records)   Restless legs    Rheumatoid arthritis ([place])    "knees" "hands", "RA"   Sleep apnea    does not use Cpap   Thoracic ascending aortic aneurysm (HCC)    4.4 cm 11/14/18 CTA   Type II diabetes mellitus (HCC)    Uterine leiomyoma 01/10/2021   Past Surgical History:  Procedure Laterality Date   ABDOMINAL AORTOGRAM W/LOWER EXTREMITY N/A 05/27/2021   Procedure: ABDOMINAL AORTOGRAM W/LOWER EXTREMITY;  Surgeon: Broadus John, MD;  Location: [place] [facility];  Service: Cardiovascular;  Laterality: N/A;   AMPUTATION Left 12/27/2018   Procedure: LEFT THUMB REVISION AMPUTATION DIGIT;  Surgeon: Charlotte Crumb, MD;  Location: [place];  Service: Orthopedics;  Laterality: Left;   AMPUTATION Left 01/15/2021   Procedure: AMPUTATION OF DIGIT LEFT INDEX FINGER;  Surgeon: Leanora Cover, MD;  Location: [place];  Service: Orthopedics;  Laterality: Left;   AMPUTATION Left 05/28/2021   Procedure: LEFT 5TH TOE RAY AMPUTATION;  Surgeon: Waynetta Sandy, MD;  Location: [place];  Service: Vascular;  Laterality: Left;   AMPUTATION Left 06/02/2021   Procedure: AMPUTATION BELOW KNEE;  Surgeon: Broadus John, MD;  Location: [facility] OR;  Service: Vascular;  Laterality: Left;   AV FISTULA PLACEMENT Left    AV FISTULA PLACEMENT Right 03/12/2019   Procedure: INSERTION OF ARTERIOVENOUS (AV) GORE-TEX GRAFT ARM;  Surgeon: Elam Dutch, MD;  Location: [place];  Service: Vascular;  Laterality: Right;   AV FISTULA PLACEMENT Right 08/13/2019   Procedure: RIGHT UPPER ARM ARTERIOVENOUS (AV) FISTULA CREATION WITH BASILIC VEIN;  Surgeon: Elam Dutch, MD;  Location: [place];  Service: Vascular;  Laterality: Right;   Page; 1997   x 2   COLONOSCOPY     EYE SURGERY  Bilateral    cataract surgery   I & D EXTREMITY Left 01/09/2021   Procedure: IRRIGATION AND DEBRIDEMENT INDEX FINGER;  Surgeon: Leanora Cover, MD;  Location: [place];  Service: Orthopedics;  Laterality: Left;   INSERTION OF DIALYSIS CATHETER Right 09/26/2018   Procedure: INSERTION OF DIALYSIS CATHETER Right Internal Jugular.;  Surgeon: Elam Dutch, MD;  Location: [facility] OR;  Service: Vascular;  Laterality: Right;   IR DIALY SHUNT INTRO NEEDLE/INTRACATH INITIAL W/IMG RIGHT Right 11/21/2020   IR FLUORO GUIDE CV LINE RIGHT  05/04/2019   IR US GUIDE VASC ACCESS RIGHT  05/04/2019   IR US GUIDE VASC ACCESS RIGHT  11/21/2020   LIGATION OF ARTERIOVENOUS  FISTULA Left 09/26/2018   Procedure: LIGATION OF ARTERIOVENOUS  FISTULA LEFT ARM;  Surgeon: Elam Dutch, MD;  Location: [place];  Service: Vascular;  Laterality: Left;   PERICARDIAL WINDOW  02/2004   for pericardial effusion   PERIPHERAL VASCULAR ATHERECTOMY  05/27/2021   Procedure: PERIPHERAL VASCULAR ATHERECTOMY;  Surgeon:  Broadus John, MD;  Location: [place] [facility];  Service: Cardiovascular;;  Left AT   PERIPHERAL VASCULAR INTERVENTION Right 10/26/2019   Procedure: PERIPHERAL VASCULAR INTERVENTION;  Surgeon: Elam Dutch, MD;  Location: [place] [facility];  Service: Cardiovascular;  Laterality: Right;  arm  AV fistula   TUBAL LIGATION  05/1995   UPPER EXTREMITY VENOGRAPHY N/A 06/22/2019   Procedure: UPPER EXTREMITY VENOGRAPHY - Right [place];  Surgeon: Elam Dutch, MD;  Location: [place] [facility];  Service: Cardiovascular;  Laterality: N/A;    Allergies  Allergen Reactions   Bupropion Itching   Dilaudid [Hydromorphone] Other (See Comments)    Apnea, required intubation    Outpatient Encounter Medications as of 06/19/2021  Medication Sig   acetaminophen (TYLENOL) 500 MG tablet Take 1,000 mg by mouth daily as needed for headache (pain).   albuterol (VENTOLIN HFA) 108 (90 Base) MCG/ACT inhaler Inhale 2 puffs into the lungs every 6  (six) hours as needed for wheezing or shortness of breath.   apixaban (ELIQUIS) 5 MG TABS tablet Take 1 tablet (5 mg total) by mouth 2 (two) times daily.   AURYXIA 1 GM 210 MG(Fe) tablet Take 420-840 mg by mouth See admin instructions. 840 mg twice daily with meals,  420 mg with a snacks   b complex-vitamin c-folic acid (NEPHRO-VITE) 0.8 MG TABS tablet Take 1 tablet by mouth every Monday, Wednesday, and Friday with hemodialysis.   bisacodyl (DULCOLAX) 10 MG suppository Place 10 mg rectally as needed for moderate constipation.   cephALEXin (KEFLEX) 500 MG capsule Take 1 capsule (500 mg total) by mouth daily for 7 days.   clopidogrel (PLAVIX) 75 MG tablet Take 1 tablet (75 mg total) by mouth daily with breakfast.   doxycycline (VIBRA-TABS) 100 MG tablet Take 1 tablet (100 mg total) by mouth 2 (two) times daily.   HUMIRA PEN 40 MG/0.4ML PNKT Inject 40 mg into the skin every 14 (fourteen) days.   hydrOXYzine (ATARAX/VISTARIL) 25 MG tablet Take 25 mg by mouth every morning.   insulin aspart (NOVOLOG FLEXPEN) 100 UNIT/ML FlexPen Inject 5 Units into the skin 3 (three) times daily with meals. And at bedtime for CBG greater than 200   metoprolol tartrate (LOPRESSOR) 25 MG tablet Take 0.5 tablets (12.5 mg total) by mouth 2 (two) times daily.   midodrine (PROAMATINE) 5 MG tablet Take 5 mg by mouth 3 (three) times daily with meals. For hypotension   NARCAN 4 MG/0.1ML LIQD nasal spray kit Place 1 spray into the nose as needed (accidental overdose.).   NON FORMULARY BIPAP at bedtime   omega-3 fish oil (MAXEPA) 1000 MG CAPS capsule Take 1,000 mg by mouth daily.   oxycodone (OXY-IR) 5 MG capsule Take 1 capsule (5 mg total) by mouth every 12 (twelve) hours as needed for up to 5 days.   OXYGEN Inhale 2 L into the lungs continuous.   pantoprazole (PROTONIX) 40 MG tablet Take 1 tablet (40 mg total) by mouth at bedtime.   Pollen Extracts (PROSTAT PO) Take 30 mLs by mouth in the morning and at bedtime.   pramipexole  (MIRAPEX) 0.5 MG tablet Take 0.5 mg by mouth daily.    rosuvastatin (CRESTOR) 40 MG tablet TAKE 1 TABLET BY MOUTH DAILY *PATIENT NEEDS APPOINTMENT*   Sodium Phosphates (RA SALINE ENEMA RE) Place 1 Dose rectally as needed.   zinc sulfate 50 MG CAPS capsule Take 220 mg by mouth daily.   Methoxy PEG-Epoetin Beta (MIRCERA IJ) Dialysis Monday,Wednesday and friday   [  DISCONTINUED] insulin aspart (NOVOLOG) 100 UNIT/ML injection Inject 0-15 Units into the skin 3 (three) times daily with meals.   [DISCONTINUED] insulin aspart (NOVOLOG) 100 UNIT/ML injection Inject 0-5 Units into the skin at bedtime.   [DISCONTINUED] midodrine (PROAMATINE) 10 MG tablet Take 0.5 tablets (5 mg total) by mouth 3 (three) times daily. (Patient not taking: Reported on 06/19/2021)   No facility-administered encounter medications on file as of 06/19/2021.    Review of Systems   Unable to obtain due to being sleepy.    Immunization History  Administered Date(s) Administered   Hepatitis B, adult 05/29/2012   Influenza,inj,Quad PF,6+ Mos 10/31/2018, 11/06/2019, 11/07/2020   Moderna Sars-Covid-2 Vaccination 04/20/2019, 05/18/2019, 12/05/2019, 07/30/2020   Pneumococcal Conjugate-13 11/17/2017   Pneumococcal Polysaccharide-23 11/03/2016   Pertinent  Health Maintenance Due  Topic Date Due   OPHTHALMOLOGY EXAM  Never done   URINE MICROALBUMIN  Never done   PAP SMEAR-Modifier  Never done   MAMMOGRAM  Never done   FOOT EXAM  05/01/2021   INFLUENZA VACCINE  09/01/2021   HEMOGLOBIN A1C  10/27/2021   COLONOSCOPY (Pts 45-52yr Insurance coverage will need to be confirmed)  08/03/2026      06/16/2021    4:00 PM 06/16/2021    8:00 PM 06/17/2021    9:00 AM 06/17/2021    8:13 PM 06/18/2021    9:00 AM  Fall Risk  Patient Fall Risk Level _0      Vitals:   06/19/21 0958  BP: 139/73  Pulse: 86  Resp: 18  Temp: (!) 96.6 F (35.9 C)  SpO2: 95%  Weight: 260  lb (117.9 kg)  Height: _1  (1.727 m)   Body mass index is 39.53 kg/m.  Physical Exam Constitutional:      Appearance: She is obese.     Comments: Morbidly obese.  HENT:     Head: Normocephalic and atraumatic.     Nose: Nose normal.     Mouth/Throat:     Mouth: Mucous membranes are moist.  Eyes:     Conjunctiva/sclera: Conjunctivae normal.  Cardiovascular:     Rate and Rhythm: Tachycardia present. Rhythm irregular.  Pulmonary:     Effort: Pulmonary effort is normal.     Breath sounds: Normal breath sounds.  Abdominal:     General: Bowel sounds are normal.     Palpations: Abdomen is soft.  Musculoskeletal:     Cervical back: Normal range of motion.     Comments: Left 2nd  and 1st finger amputated. Right upper arm AV Fistula +bruit/thrill  Skin:    General: Skin is warm and dry.     Comments: Left BKA stump with dressing, dry.  Neurological:     Mental Status: She is oriented to person, place, and time.     Comments: Sleepy.  Psychiatric:     Comments: Agitated       Labs reviewed: Recent Labs    06/07/21 0142 06/08/21 0300 06/09/21 0102 06/10/21 0134 06/15/21 0036 06/15/21 0510 06/16/21 0436 06/18/21 0354  NA 135 131* 132* 131*   < > 131* 126* 129*  K 4.3 5.0 4.4 4.5   < > 4.9 5.3* 4.5  CL 97* 93* 94* 94*   < > 91* 86* 93*  CO2 _2 < > _3 GLUCOSE 120* 135* 189* 145*   < > 73 167* 125*  BUN 40* 52* 34*  48*   < > 59* 74* 37*  CREATININE 5.60* 7.01* 4.96* 6.58*   < > 6.98* 8.09* 5.35*  CALCIUM 8.6* 8.6* 8.6* 8.6*   < > 8.4* 8.1* 8.2*  MG 2.0  --  2.0 2.0  --   --   --   --   PHOS 3.8 4.9* 3.2 3.9  --   --   --   --    < > = values in this interval not displayed.   Recent Labs    06/09/21 0102 06/10/21 0134 06/15/21 0036  AST 44* 29 38  ALT 37 26 29  ALKPHOS 191* 179* 150*  BILITOT 0.7 0.5 1.6*  PROT 7.7 7.7 7.4  ALBUMIN 2.6* 2.7* 2.9*   Recent Labs    06/09/21 0102 06/10/21 0134 06/15/21 0036 06/15/21 0237 06/15/21 0308  06/16/21 0436 06/18/21 0354  WBC 8.8 8.2 7.0  --   --  7.6 6.3  NEUTROABS 6.8 5.8 4.8  --   --   --   --   HGB 8.6* 8.4* 9.0*   < > 9.2* 8.4* 8.3*  HCT 27.3* 27.2* 28.8*   < > 27.0* 26.8* 26.8*  MCV 97.5 98.6 98.0  --   --  97.1 97.1  PLT 247 218 203  --   --  196 222   < > = values in this interval not displayed.   Lab Results  Component Value Date   TSH 2.742 06/16/2021   Lab Results  Component Value Date   HGBA1C 11.3 (H) 04/26/2021   Lab Results  Component Value Date   CHOL 158 05/28/2021   HDL 24 (L) 05/28/2021   LDLCALC 75 05/28/2021   LDLDIRECT 208 (H) 07/23/2011   TRIG 296 (H) 05/28/2021   CHOLHDL 6.6 05/28/2021    Significant Diagnostic Results in last 30 days:  DG Chest 1 View  Result Date: 06/04/2021 CLINICAL DATA:  Encounter for shortness of breath. EXAM: CHEST  1 VIEW COMPARISON:  Portable chest 04/26/2021. FINDINGS: The heart is moderately enlarged. There is perihilar vascular congestion and mild lower zonal interstitial consolidation consistent with edema. There are minimal pleural effusions. Patchy haziness similar to the prior study is noted in the lower lung fields and may suggest ground-glass edema, pneumonia or combination. The upper lung fields are generally clear. There is a stable mediastinal configuration with aortic atherosclerosis and tortuosity. Thoracic cage intact. IMPRESSION: 1. Cardiomegaly with mild vascular prominence. 2. Interstitial consolidation in the bases most likely due to edema, with patchy lower zonal haziness which could be ground-glass edema and/or pneumonia. 3. Minimal pleural effusions. 4. Clinical correlation and radiographic follow-up recommended. 5. Aortic atherosclerosis. Electronically Signed   By: Telford Nab M.D.   On: 06/04/2021 02:07   CT HEAD WO CONTRAST (5MM)  Result Date: 06/15/2021 CLINICAL DATA:  Follow-up subdural hematoma. EXAM: CT HEAD WITHOUT CONTRAST TECHNIQUE: Contiguous axial images were obtained from the base of  the skull through the vertex without intravenous contrast. RADIATION DOSE REDUCTION: This exam was performed according to the departmental dose-optimization program which includes automated exposure control, adjustment of the mA and/or kV according to patient size and/or use of iterative reconstruction technique. COMPARISON:  CT head 06/15/2021 at 1:30 a.m., 11/17/2020, and 04/23/2020 FINDINGS: Brain: A 4 mm thick hyperdensity along the posterior aspect of the falx is unchanged from today's earlier CT. While this is slightly more prominent than on the 11/17/2020 study, it is similar to the 04/23/2020 exam (particularly on coronal reformats) and is  therefore not felt to reflect hemorrhage. No definite acute intracranial hemorrhage, acute infarct, mass, midline shift, or extra-axial fluid collection is identified. There is mild cerebral atrophy. Hypodensities in the cerebral white matter bilaterally are unchanged and nonspecific but compatible with chronic small vessel ischemic disease which is mild but advanced for age. Gliosis is again seen in the centrum semiovale bilaterally extending across the body of the corpus callosum. Vascular: Calcified atherosclerosis at the skull base. Skull: No acute fracture or suspicious osseous lesion. Sinuses/Orbits: Minimal mucosal thickening in the paranasal sinuses. Trace right mastoid fluid. Bilateral cataract extraction. Bilateral proptosis. Other: None. IMPRESSION: 1. Unchanged small hyperdense focus along the posterior aspect of the falx which is chronic and does not reflect acute hemorrhage. This may instead be a venous structure, dural calcification, or a tiny meningioma. 2. No evidence of acute intracranial abnormality. 3. Mild chronic small vessel ischemic disease. Electronically Signed   By: Logan Bores M.D.   On: 06/15/2021 14:23   CT HEAD WO CONTRAST  Result Date: 06/15/2021 CLINICAL DATA:  Initial evaluation for acute altered mental status. EXAM: CT HEAD WITHOUT  CONTRAST TECHNIQUE: Contiguous axial images were obtained from the base of the skull through the vertex without intravenous contrast. RADIATION DOSE REDUCTION: This exam was performed according to the departmental dose-optimization program which includes automated exposure control, adjustment of the mA and/or kV according to patient size and/or use of iterative reconstruction technique. COMPARISON:  Prior CT from 11/17/2020. FINDINGS: Brain: Age-related cerebral atrophy with chronic microvascular ischemic disease. Small hyperdensity along the posterior falx measuring up to 4 mm in maximal thickness is present (series 2, image 16). While a small venous structure is seen at this location on prior head CT, this is more prominent on today's exam, raising the possibility for a small focal subdural hemorrhage. No associated mass effect. No other acute intracranial hemorrhage. No acute large vessel territory infarct. No mass lesion, mass effect or midline shift. No hydrocephalus. Vascular: No abnormal hyperdense vessel. Calcified atherosclerosis present at the skull base. Skull: Scalp soft tissues and calvarium demonstrate no acute finding. Sinuses/Orbits: Mild bilateral exophthalmos. Prior bilateral ocular lens replacement. Mild mucosal thickening about the right maxillary sinus. Trace right mastoid effusion. Other: None. IMPRESSION: 1. Small hyperdensity along the posterior falx measuring up to 4 mm in maximal thickness. While this could potentially be venous in nature, a possible small subdural hemorrhage is difficult to exclude. Short interval follow-up CT in approximately 12 hours recommended for further evaluation. No associated mass effect. 2. No other acute intracranial abnormality. 3. Age-related cerebral atrophy with chronic microvascular ischemic disease. Critical Value/emergent results were called by telephone at the time of interpretation on 06/15/2021 at 1:47 am to provider [facility] , who verbally  acknowledged these results. Electronically Signed   By: Jeannine Boga M.D.   On: 06/15/2021 01:51   PERIPHERAL VASCULAR CATHETERIZATION  Result Date: 05/27/2021 Images from the original result were not included. Patient name: AUBREANNA PERCLE MRN: [id number] DOB: 1965-07-27 Sex: female 05/27/2021 Pre-operative Diagnosis: Left lower extremity [place] 5 critical limb ischemia Post-operative diagnosis:  Same Surgeon:  Broadus John, MD Procedure Performed: 1.  Ultrasound-guided micropuncture access of the right common femoral artery 2.  Aortogram 3.  Second-order cannulation, left lower extremity angiogram 4.  Laser atherectomy of the anterior tibial artery 5.  Balloon angioplasty of the anterior tibial artery 6.  Device assisted closure-Mynx Indications: Patient is a 56 year old female who presented to the ED with dry gangrene of the left  fifth toe.  ABIs demonstrated monophasic waveforms in the left lower extremity.  After discussing risks and benefits of left lower extremity angiography in an effort to define and improve distal perfusion for wound healing, [place] Findings: Aortogram: Bilateral renal arteries patent, no flow-limiting stenosis. On the left: Normal common femoral artery, normal profunda, [place] femoral artery with previously placed stents.  These are patent.  Popliteal artery without stenosis.  The posterior tibial artery provides single-vessel inline flow to the foot with filling of medial and lateral plantar arteries.  The peroneal artery occludes in the mid tibia, anterior tibial artery occludes immediately distal to its ostia.  Procedure:  The patient was identified in the holding area and taken to room 8.  The patient was then placed supine on the table and prepped and draped in the usual sterile fashion.  A time out was called.  Ultrasound was used to evaluate the right common femoral artery.  It was patent .  A digital ultrasound image was acquired.  A micropuncture needle was  used to access the right common femoral artery under ultrasound guidance.  An 018 wire was advanced without resistance and a micropuncture sheath was placed.  The 018 wire was removed and a benson wire was placed.  The micropuncture sheath was exchanged for a 5 french sheath.  An omniflush catheter was advanced over the wire to the level of L-1.  An abdominal angiogram was obtained.  Next, using the omniflush catheter and a benson wire, the aortic bifurcation was crossed and the catheter was placed into theleft external iliac artery and left runoff was obtained.  right runoff was performed via retrograde sheath injections. Being that the patient has tissue loss on the left lower extremity, I elected to try anterior tibial artery recanalization.  The patient was heparinized.  A 6 x 65 cm sheath was brought onto the field and parked in the superficial femoral artery, from this location, a series of wires and catheters were used to select the anterior tibial artery, traverse the artery, and into the dorsalis pedis in the foot.  True lumen was confirmed with use of angiography.  Next, 0.9 mm laser atherectomy was brought onto the field and an effort to create a larger lumen.  This passed without issue.  Next, a 2.5 x 243m balloon was brought onto the field and expanded from the dorsalis pedis, into the below-knee popliteal artery.  On follow-up angiography, there was no filling of the anterior tibial artery.  The area was ballooned again with a noncompliant J balloon, and again, there was no filling of the anterior tibial artery.  There did not appear to be any thrombus in the lumen, my concern was for dissection flap.  Being that the patient had inline flow to the foot, I elected not to stent the proximal anterior tibial artery. Impression: Unsuccessful attempt at recanalization of the anterior tibial artery using laser atherectomy, balloon angioplasty. Patient with single-vessel posterior tibial artery runoff to the  foot JCassandria Santee MD [facility] GF[facility]viewOffice: [phone]  DG Chest Port 1 View  Result Date: 06/15/2021 CLINICAL DATA:  Orthopnea. EXAM: PORTABLE CHEST 1 VIEW COMPARISON:  06/07/2021. FINDINGS: The heart is enlarged the mediastinal contour stable. Atherosclerotic calcification of the aorta is noted. Lung volumes are low with mild residual airspace opacities at the lung bases. Subsegmental atelectasis is noted in the mid left lung. No effusion or pneumothorax. No acute osseous abnormality. IMPRESSION: 1. Mild residual airspace opacities  at the lung bases, possible edema, atelectasis, or infiltrate. 2. Cardiomegaly. Electronically Signed   By: Brett Fairy M.D.   On: 06/15/2021 00:52   DG CHEST PORT 1 VIEW  Result Date: 06/07/2021 CLINICAL DATA:  Shortness of breath. EXAM: PORTABLE CHEST 1 VIEW COMPARISON:  06/06/2021 FINDINGS: Lungs are hypoinflated with hazy perihilar and bibasilar opacification unchanged. This may be due to edema versus infection. Possible left pleural effusion unchanged. Stable cardiomegaly. Remainder of the exam is unchanged. IMPRESSION: 1. Stable hazy perihilar and bibasilar opacification which may be due to edema versus infection. Possible left pleural effusion unchanged. 2. Stable cardiomegaly. Electronically Signed   By: Marin Olp M.D.   On: 06/07/2021 14:42   DG CHEST PORT 1 VIEW  Result Date: 06/06/2021 CLINICAL DATA:  Osteomyelitis. EXAM: PORTABLE CHEST 1 VIEW COMPARISON:  06/04/2021 FINDINGS: Lungs are adequately inflated and demonstrate mild linear density over the left midlung likely atelectasis. Hazy prominence of the pulmonary vasculature without change which may be due to mild vascular congestion/edema. Hazy left base/retrocardiac density unchanged and may be due to atelectasis or layering pleural fluid. Stable cardiomegaly. Remainder of the exam is unchanged. IMPRESSION: Stable cardiomegaly with suggestion of mild vascular  congestion/edema. Stable hazy left base/retrocardiac density which may be due to atelectasis or layering pleural fluid. Electronically Signed   By: Marin Olp M.D.   On: 06/06/2021 08:59   DG Foot 2 Views Left  Result Date: 05/25/2021 CLINICAL DATA:  Osteomyelitis of fifth digit. EXAM: LEFT FOOT - 2 VIEW COMPARISON:  None. FINDINGS: There is soft tissue swelling and air in the region of the fourth and fifth metatarsals, fourth proximal phalanx, and throughout the fifth toe worrisome for infection. There is fragmentation which may represent erosion versus fracture involving the base of the fifth distal phalanx in the distal aspect of the fifth proximal phalanx. This appears nondisplaced. No dislocation. There is likely a healed fracture of the fifth proximal metatarsal. Peripheral vascular calcifications are present. IMPRESSION: 1. Soft tissue swelling and air involving the fourth and fifth metatarsals and phalanges worrisome for infection. 2. Fragmentation secondary to osteomyelitis versus fracture involving the fifth proximal and distal phalanx. Please correlate clinically. Electronically Signed   By: Ronney Asters M.D.   On: 05/25/2021 20:33   EEG adult  Result Date: 06/08/2021 Lora Havens, MD     06/08/2021  3:00 PM Patient Name: MCKENLEY BIRENBAUM MRN: [id number] Epilepsy Attending: Lora Havens Referring Physician/Provider: Elodia Florence., MD Date: 06/08/2021 Duration: 21.30 mins Patient history: 56 year old female with altered mental status.  EEG to evaluate for seizure. Level of alertness: lethargic AEDs during EEG study: None Technical aspects: This EEG study was done with scalp electrodes positioned according to the 10-20 International system of electrode placement. Electrical activity was acquired at a sampling rate of _0  and reviewed with a high frequency filter of _1  and a low frequency filter of _2 . EEG data were recorded continuously and digitally stored. Description: No  posterior dominant rhythm was seen. EEG showed continuous generalized 3 to 6 Hz theta-delta slowing. Hyperventilation and photic stimulation were not performed.   ABNORMALITY - Continuous slow, generalized IMPRESSION: This study is suggestive of moderate diffuse encephalopathy, nonspecific etiology. No seizures or epileptiform discharges were seen throughout the recording. Priyanka O Yadav   VAS [place] ABI WITH/WO TBI  Result Date: 05/26/2021  LOWER EXTREMITY DOPPLER STUDY Patient Name:  YARELI CARTHEN  Date of Exam:   05/26/2021 Medical Rec #: [id number]  Accession #:    [id number] Date of Birth: Sep 03, 1965            Patient Gender: F Patient Age:   71 years Exam Location:  [facility] Procedure:      VAS [place] ABI WITH/WO TBI Referring Phys: MARCUS DUDA --------------------------------------------------------------------------------  Indications: PVD w/ gangrene LE left. High Risk         Hypertension, hyperlipidemia, Diabetes, coronary artery Factors:          disease.  Comparison Study: no prior Performing Technologist: Archie Patten RVS  Examination Guidelines: A complete evaluation includes at minimum, Doppler waveform signals and systolic blood pressure reading at the level of bilateral brachial, anterior tibial, and posterior tibial arteries, when vessel segments are accessible. Bilateral testing is considered an integral part of a complete examination. Photoelectric Plethysmograph (PPG) waveforms and toe systolic pressure readings are included as required and additional duplex testing as needed. Limited examinations for reoccurring indications may be performed as noted.  ABI Findings: +---------+------------------+-----+----------+--------------------------------+ Right    Rt Pressure (mmHg)IndexWaveform  Comment                          +---------+------------------+-----+----------+--------------------------------+ Brachial                                  fistula                           +---------+------------------+-----+----------+--------------------------------+ PTA      79                0.70 monophasic                                 +---------+------------------+-----+----------+--------------------------------+ DP       255               2.26 monophasic                                 +---------+------------------+-----+----------+--------------------------------+ Great Toe                                 unable to obtain pressure due to                                           decreased toe amplitude          +---------+------------------+-----+----------+--------------------------------+ +---------+------------------+-----+-------------------+-------+ Left     Lt Pressure (mmHg)IndexWaveform           Comment +---------+------------------+-----+-------------------+-------+ Brachial 113                    triphasic                  +---------+------------------+-----+-------------------+-------+ PTA      92                0.81 monophasic                 +---------+------------------+-----+-------------------+-------+ DP       91  0.81 dampened monophasic        +---------+------------------+-----+-------------------+-------+ Great Toe26                0.23 Abnormal                   +---------+------------------+-----+-------------------+-------+ +-------+-----------+-----------+------------+------------+ ABI/TBIToday's ABIToday's TBIPrevious ABIPrevious TBI +-------+-----------+-----------+------------+------------+ Right  2.25                                           +-------+-----------+-----------+------------+------------+ Left   0.81       0.23                                +-------+-----------+-----------+------------+------------+ Arterial wall calcification precludes accurate ankle pressures and ABIs.  Summary: Right: Resting right ankle-brachial index indicates noncompressible  right lower extremity arteries. Left: Resting left ankle-brachial index indicates mild left lower extremity arterial disease. The left toe-brachial index is abnormal. ABI's may be unreliable due to due calcification. *See table(s) above for measurements and observations.  Electronically signed by Servando Snare MD on 05/26/2021 at 4:04:17 PM.    Final    ECHOCARDIOGRAM COMPLETE  Result Date: 06/05/2021    ECHOCARDIOGRAM REPORT   Patient Name:   ZAYA KESSENICH Date of Exam: 06/05/2021 Medical Rec #:  [id number]          Height:       68.0 in Accession #:    [id number]         Weight:       248.2 lb Date of Birth:  Dec 18, 1965           BSA:          2.240 m Patient Age:    74 years           BP:           181/159 mmHg Patient Gender: F                  HR:           105 bpm. Exam Location:  Inpatient Procedure: 2D Echo, Cardiac Doppler, Color Doppler and Intracardiac            Opacification Agent Indications:    Atrial fibrillation  History:        Patient has prior history of Echocardiogram examinations, most                 recent 11/25/2020. CAD; Risk Factors:Hypertension, Diabetes and                 Dyslipidemia. ESRD.  Sonographer:    Clayton Lefort RDCS (AE) Referring Phys: [phone] A CALDWELL POWELL JR  Sonographer Comments: Technically challenging study due to limited acoustic windows, Technically difficult study due to poor echo windows and patient is morbidly obese. Image acquisition challenging due to patient body habitus. Extremely challenging exam. 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 Patient moving thoughout exam. IMPRESSIONS  1. Left ventricular ejection fraction, by estimation, is 60 to 65%. The left ventricle has normal function. The left ventricle has no regional wall motion abnormalities. There is moderate left ventricular hypertrophy. Left ventricular diastolic function  could not be evaluated. There is the interventricular septum is flattened in systole,  consistent with right ventricular pressure overload.  2. Right ventricular systolic function  is mildly reduced. The right ventricular size is mildly enlarged. There is severely elevated pulmonary artery systolic pressure.  3. The mitral valve is grossly normal. Mild mitral valve regurgitation.  4. Tricuspid valve regurgitation is moderate.  5. Aortic valve regurgitation is not visualized.  6. Aortic no significant aortic aneurysm. Conclusion(s)/Recommendation(s): RVSP has increased from prior study. FINDINGS  Left Ventricle: Left ventricular ejection fraction, by estimation, is 60 to 65%. The left ventricle has normal function. The left ventricle has no regional wall motion abnormalities. Definity contrast agent was given IV to delineate the left ventricular  endocardial borders. The left ventricular internal cavity size was normal in size. There is moderate left ventricular hypertrophy. The interventricular septum is flattened in systole, consistent with right ventricular pressure overload. Left ventricular  diastolic function could not be evaluated. Right Ventricle: The right ventricular size is mildly enlarged. Right ventricular systolic function is mildly reduced. There is severely elevated pulmonary artery systolic pressure. The tricuspid regurgitant velocity is 3.77 m/s, and with an assumed right atrial pressure of 15 mmHg, the estimated right ventricular systolic pressure is 93.9 mmHg. Left Atrium: Left atrial size was not well visualized. Right Atrium: Right atrial size was not well visualized. Pericardium: There is no evidence of pericardial effusion. Mitral Valve: The mitral valve is grossly normal. Mild mitral valve regurgitation. Tricuspid Valve: The tricuspid valve is grossly normal. Tricuspid valve regurgitation is moderate. Aortic Valve: Aortic valve regurgitation is not visualized. Aortic valve mean gradient measures 9.0 mmHg. Aortic valve peak gradient measures 17.3 mmHg. Aortic valve area, by VTI  measures 1.73 cm. Pulmonic Valve: Pulmonic valve regurgitation is not visualized. Aorta: No significant aortic aneurysm. IAS/Shunts: The interatrial septum was not well visualized.  LEFT VENTRICLE PLAX 2D LVIDd:         4.30 cm LVIDs:         2.30 cm LV PW:         1.70 cm LV IVS:        1.20 cm LVOT diam:     2.00 cm LV SV:         53 LV SV Index:   24 LVOT Area:     3.14 cm  RIGHT VENTRICLE             IVC RV Basal diam:  3.70 cm     IVC diam: 3.20 cm RV Mid diam:    4.10 cm RV S prime:     13.60 cm/s TAPSE (M-mode): 1.7 cm LEFT ATRIUM           Index        RIGHT ATRIUM           Index LA diam:      4.50 cm 2.01 cm/m   RA Area:     20.20 cm LA Vol (A2C): 60.7 ml 27.09 ml/m  RA Volume:   56.80 ml  25.35 ml/m LA Vol (A4C): 65.9 ml 29.42 ml/m  AORTIC VALVE AV Area (Vmax):    1.60 cm AV Area (Vmean):   1.68 cm AV Area (VTI):     1.73 cm AV Vmax:           208.00 cm/s AV Vmean:          140.000 cm/s AV VTI:            0.308 m AV Peak Grad:      17.3 mmHg AV Mean Grad:      9.0 mmHg LVOT Vmax:  106.00 cm/s LVOT Vmean:        75.000 cm/s LVOT VTI:          0.170 m LVOT/AV VTI ratio: 0.55  AORTA Ao Root diam: 3.10 cm Ao Asc diam:  3.80 cm TRICUSPID VALVE TR Peak grad:   56.9 mmHg TR Vmax:        377.00 cm/s  SHUNTS Systemic VTI:  0.17 m Systemic Diam: 2.00 cm Phineas Inches Electronically signed by Phineas Inches Signature Date/Time: 06/05/2021/5:17:19 PM    Final    [place] EKG SITE RITE  Result Date: 05/29/2021 If Site Rite image not attached, placement could not be confirmed due to current cardiac rhythm.   Assessment/Plan . 1. Acute encephalopathy -   today' episode of AMS was thought to be due to lack of sleep -  hospitalization AMS was responsive to Narcan -  Percocet home dosage was decreased to Oxycodone 5 mg Q 12 hours PRN  2. Subdural bleeding (HCC) -   CT head finding of 4 mm hemorrhage, per neurology, appear to be chronic and favored a venous structure -  Eliquis was reversed by Andexxa -   Eliquis was restarted on 5/18  3. Atrial fibrillation with RVR (HCC) -  Eliquis was restarted on 5/18 -  Cardizem drip was stopped and Metoprolol tartrate held due to hypotension  4. Type 2 diabetes mellitus with chronic kidney disease on chronic dialysis, with long-term current use of insulin (HCC) Lab Results  Component Value Date   HGBA1C 11.3 (H) 04/26/2021   -  continue Novolog 5 units TID and HS for CBG > 200  5. Right Venous stasis -   continue Doxycycline and Keflex X 1 week  6. Anxiety -  continue Hydroxyzine  7. ESRD (end stage renal disease) ([place]) -  hemodialysis [facility]    Family/ staff Communication: Discussed plan of care with resident and charge nurse.  Labs/tests ordered:  CBC with differentials and BMP with GFR    Durenda Age, DNP, MSN, FNP-BC [place] [phone] (Monday-Friday 8:00 a.m. - 5:00 p.m.) [phone] (after hours)

## 2021-06-22 ENCOUNTER — Emergency Department (HOSPITAL_COMMUNITY): Payer: Medicare Other

## 2021-06-22 ENCOUNTER — Emergency Department (HOSPITAL_COMMUNITY)
Admission: EM | Admit: 2021-06-22 | Discharge: 2021-06-22 | Disposition: A | Discharge status: Skilled Nursing Facility | Payer: Medicare Other | Attending: Emergency Medicine | Admitting: Emergency Medicine

## 2021-06-22 ENCOUNTER — Encounter (HOSPITAL_COMMUNITY): Payer: Self-pay | Admitting: Emergency Medicine

## 2021-06-22 DIAGNOSIS — E1122 Type 2 diabetes mellitus with diabetic chronic kidney disease: Secondary | ICD-10-CM | POA: Insufficient documentation

## 2021-06-22 DIAGNOSIS — Z992 Dependence on renal dialysis: Secondary | ICD-10-CM | POA: Diagnosis not present

## 2021-06-22 DIAGNOSIS — Z79899 Other long term (current) drug therapy: Secondary | ICD-10-CM | POA: Diagnosis not present

## 2021-06-22 DIAGNOSIS — I4891 Unspecified atrial fibrillation: Secondary | ICD-10-CM | POA: Insufficient documentation

## 2021-06-22 DIAGNOSIS — Z7901 Long term (current) use of anticoagulants: Secondary | ICD-10-CM | POA: Diagnosis not present

## 2021-06-22 DIAGNOSIS — R4182 Altered mental status, unspecified: Secondary | ICD-10-CM | POA: Diagnosis present

## 2021-06-22 DIAGNOSIS — I509 Heart failure, unspecified: Secondary | ICD-10-CM | POA: Insufficient documentation

## 2021-06-22 DIAGNOSIS — I2510 Atherosclerotic heart disease of native coronary artery without angina pectoris: Secondary | ICD-10-CM | POA: Diagnosis not present

## 2021-06-22 DIAGNOSIS — I132 Hypertensive heart and chronic kidney disease with heart failure and with stage 5 chronic kidney disease, or end stage renal disease: Secondary | ICD-10-CM | POA: Insufficient documentation

## 2021-06-22 DIAGNOSIS — N186 End stage renal disease: Secondary | ICD-10-CM | POA: Diagnosis not present

## 2021-06-22 DIAGNOSIS — Z794 Long term (current) use of insulin: Secondary | ICD-10-CM | POA: Insufficient documentation

## 2021-06-22 LAB — CBC WITH DIFFERENTIAL/PLATELET
Abs Immature Granulocytes: 0.03 10*3/uL (ref 0.00–0.07)
Basophils Absolute: 0 10*3/uL (ref 0.0–0.1)
Basophils Relative: 1 %
Eosinophils Absolute: 0.1 10*3/uL (ref 0.0–0.5)
Eosinophils Relative: 2 %
HCT: 29.1 % — ABNORMAL LOW (ref 36.0–46.0)
Hemoglobin: 8.9 g/dL — ABNORMAL LOW (ref 12.0–15.0)
Immature Granulocytes: 1 %
Lymphocytes Relative: 13 %
Lymphs Abs: 0.6 10*3/uL — ABNORMAL LOW (ref 0.7–4.0)
MCH: 30 pg (ref 26.0–34.0)
MCHC: 30.6 g/dL (ref 30.0–36.0)
MCV: 98 fL (ref 80.0–100.0)
Monocytes Absolute: 0.4 10*3/uL (ref 0.1–1.0)
Monocytes Relative: 9 %
Neutro Abs: 3.6 10*3/uL (ref 1.7–7.7)
Neutrophils Relative %: 74 %
Platelets: 189 10*3/uL (ref 150–400)
RBC: 2.97 MIL/uL — ABNORMAL LOW (ref 3.87–5.11)
RDW: 17.4 % — ABNORMAL HIGH (ref 11.5–15.5)
WBC: 4.7 10*3/uL (ref 4.0–10.5)
nRBC: 1.9 % — ABNORMAL HIGH (ref 0.0–0.2)

## 2021-06-22 LAB — I-STAT CHEM 8, ED
BUN: 24 mg/dL — ABNORMAL HIGH (ref 6–20)
Calcium, Ion: 1.05 mmol/L — ABNORMAL LOW (ref 1.15–1.40)
Chloride: 91 mmol/L — ABNORMAL LOW (ref 98–111)
Creatinine, Ser: 3.9 mg/dL — ABNORMAL HIGH (ref 0.44–1.00)
Glucose, Bld: 150 mg/dL — ABNORMAL HIGH (ref 70–99)
HCT: 30 % — ABNORMAL LOW (ref 36.0–46.0)
Hemoglobin: 10.2 g/dL — ABNORMAL LOW (ref 12.0–15.0)
Potassium: 3.6 mmol/L (ref 3.5–5.1)
Sodium: 131 mmol/L — ABNORMAL LOW (ref 135–145)
TCO2: 32 mmol/L (ref 22–32)

## 2021-06-22 LAB — BASIC METABOLIC PANEL
Anion gap: 11 (ref 5–15)
BUN: 21 mg/dL — ABNORMAL HIGH (ref 6–20)
CO2: 31 mmol/L (ref 22–32)
Calcium: 8.3 mg/dL — ABNORMAL LOW (ref 8.9–10.3)
Chloride: 92 mmol/L — ABNORMAL LOW (ref 98–111)
Creatinine, Ser: 3.68 mg/dL — ABNORMAL HIGH (ref 0.44–1.00)
GFR, Estimated: 14 mL/min — ABNORMAL LOW (ref >60)
Glucose, Bld: 148 mg/dL — ABNORMAL HIGH (ref 70–99)
Potassium: 3.5 mmol/L (ref 3.5–5.1)
Sodium: 134 mmol/L — ABNORMAL LOW (ref 135–145)

## 2021-06-22 LAB — TROPONIN I (HIGH SENSITIVITY): Troponin I (High Sensitivity): 37 ng/L — ABNORMAL HIGH (ref <18)

## 2021-06-22 NOTE — ED Notes (Signed)
Pt continues to remove monitoring equipment. Pt sitting on side of bed

## 2021-06-22 NOTE — ED Notes (Signed)
Patient becomes combative when attempting to measure vital signs shouting "don't touch me like that Iona Beard". There is no one in the room named Iona Beard and the patient does not open her eyes at any point during the interaction.

## 2021-06-22 NOTE — ED Notes (Signed)
IV attempt unsuccessful. Patient becomes very combative during attempt.

## 2021-06-22 NOTE — ED Provider Notes (Signed)
[facility] EMERGENCY DEPARTMENT Provider Note   CSN: [id number] Arrival date & time: 06/22/21  1536     History  Chief Complaint  Patient presents with   Altered Mental Status    [name] is a 56 y.o. female.  56 year old female with prior medical history as detailed below presents for evaluation.  EMS was called to evaluate patient at dialysis.  Patient had completed dialysis.  Patient reportedly with decreased mental status at time of initial EMS evaluation.  Patient was initially unresponsive with pinpoint pupils.  Patient was given 1 mg of Narcan by EMS.  Patient is now arousable to voice.  She is unable to provide significant details.  She cannot specifically denied taking extra dose of narcotics.  Patient with recent history of presentations requiring Narcan for opiate reversal.  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  The history is provided by the patient, the EMS personnel and medical records.  Altered Mental Status Presenting symptoms: lethargy   Severity:  Moderate Most recent episode:  Today Episode history:  Unable to specify Timing:  Unable to specify Progression:  Unable to specify     Home Medications Prior to Admission medications   Medication Sig Start Date End Date Taking? Authorizing Provider  acetaminophen (TYLENOL) 500 MG tablet Take 1,000 mg by mouth daily as needed for headache (pain).    [provider]  albuterol (VENTOLIN HFA) 108 (90 Base) MCG/ACT inhaler Inhale 2 puffs into the lungs every 6 (six) hours as needed for wheezing or shortness of breath. 02/22/20   Thurnell Lose, MD  apixaban (ELIQUIS) 5 MG TABS tablet Take 1 tablet (5 mg total) by mouth 2 (two) times daily. 06/11/21   Gherghe, Vella Redhead, MD  AURYXIA 1 GM 210 MG(Fe) tablet Take 420-840 mg by mouth See admin instructions. 840 mg twice daily with  meals,  420 mg with a snacks 05/29/18   [provider]  b complex-vitamin c-folic acid (NEPHRO-VITE) 0.8 MG TABS tablet Take 1 tablet by mouth every Monday, Wednesday, and Friday with hemodialysis. 04/12/19   [provider]  bisacodyl (DULCOLAX) 10 MG suppository Place 10 mg rectally as needed for moderate constipation.    [provider]  cephALEXin (KEFLEX) 500 MG capsule Take 1 capsule (500 mg total) by mouth daily for 7 days. 06/18/21 06/25/21  Elgergawy, Silver Huguenin, MD  clopidogrel (PLAVIX) 75 MG tablet Take 1 tablet (75 mg total) by mouth daily with breakfast. 06/11/21   Caren Griffins, MD  doxycycline (VIBRA-TABS) 100 MG tablet Take 1 tablet (100 mg total) by mouth 2 (two) times daily. 06/18/21   Elgergawy, Silver Huguenin, MD  HUMIRA PEN 40 MG/0.4ML PNKT Inject 40 mg into the skin every 14 (fourteen) days. 08/22/20   [provider]  hydrOXYzine (ATARAX/VISTARIL) 25 MG tablet Take 25 mg by mouth every morning.    [provider]  insulin aspart (NOVOLOG FLEXPEN) 100 UNIT/ML FlexPen Inject 5 Units into the skin 3 (three) times daily with meals. And at bedtime for CBG greater than 200    [provider]  Methoxy PEG-Epoetin Beta (MIRCERA IJ) Dialysis Monday,Wednesday and friday 12/19/20 12/18/21  [provider]  metoprolol tartrate (LOPRESSOR) 25 MG tablet Take 0.5 tablets (12.5 mg total) by mouth 2 (two) times daily. 06/11/21   Caren Griffins, MD  midodrine (PROAMATINE) 5 MG tablet  Take 5 mg by mouth 3 (three) times daily with meals. For hypotension    [provider]  NARCAN 4 MG/0.1ML LIQD nasal spray kit Place 1 spray into the nose as needed (accidental overdose.). 10/10/19   [provider]  NON FORMULARY BIPAP at bedtime    [provider]  omega-3 fish oil (MAXEPA) 1000 MG CAPS capsule Take 1,000 mg by mouth daily.    [provider]  oxycodone (OXY-IR) 5 MG capsule Take 1 capsule (5 mg total) by  mouth every 12 (twelve) hours as needed for up to 5 days. 06/18/21 06/23/21  Elgergawy, Silver Huguenin, MD  OXYGEN Inhale 2 L into the lungs continuous.    [provider]  pantoprazole (PROTONIX) 40 MG tablet Take 1 tablet (40 mg total) by mouth at bedtime. 06/18/21   Elgergawy, Silver Huguenin, MD  Pollen Extracts (PROSTAT PO) Take 30 mLs by mouth in the morning and at bedtime.    [provider]  pramipexole (MIRAPEX) 0.5 MG tablet Take 0.5 mg by mouth daily.  05/18/17   [provider]  rosuvastatin (CRESTOR) 40 MG tablet TAKE 1 TABLET BY MOUTH DAILY *PATIENT NEEDS APPOINTMENT* 12/08/20   Minus Breeding, MD  Sodium Phosphates (RA SALINE ENEMA RE) Place 1 Dose rectally as needed.    [provider]  zinc sulfate 50 MG CAPS capsule Take 220 mg by mouth daily.    [provider]      Allergies    Bupropion and Dilaudid [hydromorphone]    Review of Systems   Review of Systems  Unable to perform ROS: Acuity of condition   Physical Exam Updated Vital Signs LMP  (LMP Unknown) Comment: LMP Feb 2011 Physical Exam Vitals and nursing note reviewed.  Constitutional:      General: She is not in acute distress.    Appearance: She is well-developed.     Comments: Appears lethargic, arouses to verbal stimulation, is able to state her name  HENT:     Head: Normocephalic and atraumatic.  Eyes:     Conjunctiva/sclera: Conjunctivae normal.     Pupils: Pupils are equal, round, and reactive to light.  Cardiovascular:     Rate and Rhythm: Normal rate and regular rhythm.     Heart sounds: Normal heart sounds.  Pulmonary:     Effort: Pulmonary effort is normal. No respiratory distress.     Breath sounds: Normal breath sounds.  Abdominal:     General: There is no distension.     Palpations: Abdomen is soft.     Tenderness: There is no abdominal tenderness.  Musculoskeletal:        General: No deformity. Normal range of motion.     Cervical back: Normal range of motion  and neck supple.  Skin:    General: Skin is warm and dry.  Neurological:     General: No focal deficit present.     Comments: Lethargic, MS reportedly improved after Narcan administration in the field.    ED Results / Procedures / Treatments   Labs (all labs ordered are listed, but only abnormal results are displayed) Labs Reviewed  CBC WITH DIFFERENTIAL/PLATELET  BASIC METABOLIC PANEL  RAPID URINE DRUG SCREEN, HOSP PERFORMED  I-STAT CHEM 8, ED  TROPONIN I (HIGH SENSITIVITY)    EKG None  Radiology No results found.  Procedures Procedures    Medications Ordered in ED Medications - No data to display  ED Course/ Medical Decision Making/ A&P  Medical Decision Making Amount and/or Complexity of Data Reviewed Labs: ordered. Radiology: ordered.    Medical Screen Complete  This patient presented to the ED with complaint of AMS.  This complaint involves an extensive number of treatment options. The initial differential diagnosis includes, but is not limited to, alteration of mental status secondary to narcotic overdose  This presentation is: Acute, Chronic, Self-Limited, Previously Undiagnosed, Uncertain Prognosis, Complicated, Systemic Symptoms, and Threat to Life/Bodily Function  Patient arrives from dialysis.  Patient was found to be altered at the end of dialysis session.  EMS administered Narcan in the field.  This improved mentation significantly.  Upon arrival to the ED the patient was responding to verbal stimulation, but still unable to follow commands.  Work-up was without significant acute abnormality identified.  After period of observation, patient's mentation improved.  At time of discharge she is oriented x4.  She declines further observation and/or admission.  She is emphatic and requesting discharge from the facility.  At time of discharge, patient has capacity to refuse/declined additional care.  She understands that her  arrival here was most likely secondary to narcotic overdose.  She is advised that using narcotics in the future is extremely dangerous for her.  She is advised to use narcotics extremely sparingly and or not at all.  Strict return precautions given.  Importance of close follow-up is stressed.   Additional history obtained:  Additional history obtained from EMS External records from outside sources obtained and reviewed including prior ED visits and prior Inpatient records.    Lab Tests:  I ordered and personally interpreted labs.  The pertinent results include: CBC, CMP, i-STAT, troponin   Imaging Studies ordered:  I ordered imaging studies including CT head I independently visualized and interpreted obtained imaging which showed NAD I agree with the radiologist interpretation.   Cardiac Monitoring:  The patient was maintained on a cardiac monitor.  I personally viewed and interpreted the cardiac monitor which showed an underlying rhythm of: NSR  Problem List / ED Course:  AMS, narcotic overdose   Reevaluation:  After the interventions noted above, I reevaluated the patient and found that they have: improved  Disposition:  After consideration of the diagnostic results and the patients response to treatment, I feel that the patent would benefit from close outpatient follow-up.     CRITICAL CARE Performed by: Valarie Merino   Total critical care time: 45 minutes  Critical care time was exclusive of separately billable procedures and treating other patients.  Critical care was necessary to treat or prevent imminent or life-threatening deterioration.  Critical care was time spent personally by me on the following activities: development of treatment plan with patient and/or surrogate as well as nursing, discussions with consultants, evaluation of patient's response to treatment, examination of patient, obtaining history from patient or surrogate, ordering and  performing treatments and interventions, ordering and review of laboratory studies, ordering and review of radiographic studies, pulse oximetry and re-evaluation of patient's condition.         Final Clinical Impression(s) / ED Diagnoses Final diagnoses:  Altered mental status, unspecified altered mental status type    Rx / DC Orders ED Discharge Orders     None         Valarie Merino, MD 06/22/21 2329

## 2021-06-22 NOTE — Discharge Instructions (Signed)
Return for any problem.  Use narcotics extremely carefully.

## 2021-06-22 NOTE — Progress Notes (Signed)
Pt screaming about a person named Iona Beard and Call the Police. Explained to patient that she is in the hospital and that staff is here to help her. Attempting to obtain IV access with 4 people helping to hold extremities. Pt spitting and trying to bite staff. Applied a mask to patient due to spitting. Attempted 2 U/S IV's patient was just too wild even with 4 people assisting. No IV access achieved -got in vein twice than patient tried to sit up and lost both sites. Still has Dialysis access catheters in her arm - for safety reinforced and taped  to skin to prevent patient from pulling them out. RN aware.Will return at later time when patient is not so wild and uncooperative to D/C Dialysis access catheters.

## 2021-06-22 NOTE — ED Triage Notes (Signed)
Patient BIB [facility] from dialysis center for altered mental status. EMS states patient was initially unresponsive with pin-point pupils, patient given '1mg'$  narcan and now arouses to voice.

## 2021-06-22 NOTE — ED Notes (Signed)
[facility] called for transport back to [facility]. 5th on the list

## 2021-06-22 NOTE — ED Notes (Signed)
Pt's son at bedside, pt requesting son take her back to facility. Pt had removed oxygen stating she "only wears it sometimes". Able to transition pt from bedside commode to wheelchair. [facility] made aware that pt is coming POV

## 2021-06-22 NOTE — ED Notes (Signed)
No bleeding noted at fistula site; Pt is on home oxygen, continues to remove cannula. Alert and oriented x3

## 2021-06-23 ENCOUNTER — Encounter: Payer: Self-pay | Admitting: Internal Medicine

## 2021-06-23 ENCOUNTER — Non-Acute Institutional Stay (SKILLED_NURSING_FACILITY): Payer: Medicare Other | Admitting: Internal Medicine

## 2021-06-23 DIAGNOSIS — G9341 Metabolic encephalopathy: Secondary | ICD-10-CM

## 2021-06-23 DIAGNOSIS — I4891 Unspecified atrial fibrillation: Secondary | ICD-10-CM

## 2021-06-23 NOTE — Assessment & Plan Note (Addendum)
Narcan administration as per protocol. Stat ammonia level, RPR/VDRL, and LFTs as well as blood screen for opioids, barbiturates, marijuana as per [facility] .

## 2021-06-23 NOTE — Progress Notes (Signed)
NURSING HOME LOCATION:  [facility] ROOM NUMBER:  [id number]  CODE STATUS:  Full Code  PCP:  Sandi Mariscal MD  This is a nursing facility follow up visit for [facility] readmission within 30 days  Interim medical record and care since last SNF visit was updated with review of diagnostic studies and change in clinical status since last visit were documented.  HPI: She was rehospitalized 5/15 - 06/18/2021 with altered mental status.  The patient had left the facility and exhibited altered mental status upon return.  EMS was called on 5/14; there was improvement in her mental status with Narcan.  In the ED she continued to have unresponsiveness necessitating a Narcan drip with improvement. CT suggested a possible small 4 mm subdural hematoma.  Eliquis was reversed with Andexxa.AF with RVR  was treated with diltiazem with good rate control. Narcan drip was discontinued and she was transferred to the medical floor. 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 Patient denies narcotic use other than prescribed agents or illicit drug use .  She had been on Percocet 10/325 1 tablet 3 times daily as needed; this was decreased to 5 mg every 12 hours as needed.  She was discharged on insulin sliding scale; insulin 75/25 was to be reinitiated at the SNF if glucoses trended upward.  She was to continue HD Monday, Wednesday, and Friday.  She returned to the ED 5/22, transferred from HD because of decreased mental status after completion of HD.  In the ED she was initially unresponsive with pinpoint pupils.  EMS had given 1 mg of Narcan.  In the ED she was arousable to voice; she was unable to provide significant history.  Labs and imaging revealed no acute process or significant change.  There was no progression of the subdural hematoma.  After period observation patient's mentation improved.  At the time of  discharge she was oriented x4.  She declined further observation and admission.  She emphatically requested discharge from the ED and was felt to have the capacity to make such a decision.  The ED note states that she understood the process necessitating ED treatment was most likely secondary to narcotic overdose.  She was advised that using narcotics in the future would be extremely dangerous to her health or life.  Despite this clinical impression; no drug screening was completed. Epic records were reviewed.  In reference to altered mental status TSH was performed 5/16 and revealed a therapeutic value of 2.742.  Ammonia level was 27 on 5/4.  On 5/7 AST was 128 and ALT 66 but values were normal on 5/15 with an AST of 38 and ALT of 29.  B12 was supratherapeutic at 1657 on 5/6.  VDRL/RPR was last performed 07/16/2011 and was nonreactive.  This morning she requested leave to return to her home for unknown reasons.  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  Upon return here she was markedly lethargic and having difficulty sitting up in a wheelchair.  Review of systems: She denied any active symptoms.  Physical exam:  Pertinent or positive findings: As noted she is profoundly lethargic slumping to the side in the wheelchair.  She stated that she was "doing pretty good".  She kept falling asleep during the attempts at an interview and exam.  Pupils were small.  She has slight ptosis on the right.  Proptosis suggested.  Speech is garbled.  When I asked her to open her mouth for exam; she kept her mouth open with her eyes closed.  She had to be told to close her mouth.  She has low-grade rhonchi bilaterally which obscure the heart sounds.  Heart sounds are distant and rhythm is irregular.  Abdomen is large and protuberant.  Pedal pulses are not palpable @ the right lower extremity.  There is tense edema in the right lower extremity.  There is a dressing over the right shin.   There is a left BKA present.  She is missing the left thumb and index finger.  General appearance: no acute distress, increased work of breathing is present.   Lymphatic: No lymphadenopathy about the head, neck, axilla. Eyes: No conjunctival inflammation or lid edema is present. There is no scleral icterus. Ears:  External ear exam shows no significant lesions or deformities.   Nose:  External nasal examination shows no deformity or inflammation. Nasal mucosa are pink and moist without lesions, exudates Oral exam:  Lips and gums are healthy appearing. There is no oropharyngeal erythema or exudate. Neck:  No thyromegaly, masses, tenderness noted.    Heart:  No gallop, murmur, click, rub .  Lungs:  without wheezes,  rales, rubs. Abdomen: Bowel sounds are normal. Abdomen is soft and nontender with no organomegaly, hernias, masses. GU: Deferred  Extremities:  No cyanosis, clubbing  Neurologic exam :Balance, Rhomberg, finger to nose testing could not be completed due to clinical state Skin: Warm & dry w/o tenting. No significant rash.  See summary under each active problem in the Problem List with associated updated therapeutic plan

## 2021-06-23 NOTE — Patient Instructions (Signed)
See assessment and plan under each diagnosis in the problem list and acutely for this visit 

## 2021-06-23 NOTE — Assessment & Plan Note (Signed)
Rhythm is irregular; heart sounds are distant, partially obscured by rhonchi.  Rate is adequately controlled.

## 2021-07-01 NOTE — Progress Notes (Unsigned)
POST OPERATIVE OFFICE NOTE    CC:  F/u for surgery  HPI:  This is a 56 y.o. 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 Virl Cagey. 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 She presents today for follow up wound check on BKA. She is currently residing at [facility]. She presents today with her son. She is anticipating discharge home in 9 days. She says pain is manageable with her pain medication. She has been back to ER on several occasions secondary to AMS requiring administration of Narcan. Her visit today is challenging second to lethargy and falling asleep during visit. She is able to tell me she is doing well with her PT. She is unsure how often they are cleaning or changing dressings or if her leg has been draining. Her dressings today were somewhat saturated. She does explain that she has been elevating her leg when she can. She says she does not have a shrinker to wear. She is not complaining of any pain on her right leg. She has chronic right lower extremity wounds.   She dialyzes on MWF at the [facility] location  Allergies  Allergen Reactions   Bupropion Itching   Dilaudid [Hydromorphone] Other (See Comments)    Apnea, required intubation    Current Outpatient Medications  Medication Sig Dispense Refill   acetaminophen (TYLENOL) 500 MG tablet Take 1,000 mg by mouth daily as needed for headache (pain).     albuterol (VENTOLIN HFA) 108 (90 Base) MCG/ACT inhaler Inhale 2 puffs into the lungs every 6 (six) hours as needed for wheezing or shortness of breath. 6.7 g 0   apixaban (ELIQUIS) 5 MG TABS tablet Take 1 tablet (5 mg total) by mouth 2 (two) times daily. 60 tablet    AURYXIA 1 GM 210 MG(Fe) tablet Take 420-840 mg by mouth See admin instructions. 840 mg twice daily with meals,  420 mg with a snacks     b complex-vitamin c-folic acid (NEPHRO-VITE) 0.8 MG TABS tablet Take 1 tablet by mouth every Monday, Wednesday, and  Friday with hemodialysis.     bisacodyl (DULCOLAX) 10 MG suppository Place 10 mg rectally as needed for moderate constipation.     clopidogrel (PLAVIX) 75 MG tablet Take 1 tablet (75 mg total) by mouth daily with breakfast.     doxycycline (VIBRA-TABS) 100 MG tablet Take 1 tablet (100 mg total) by mouth 2 (two) times daily. 14 tablet 0   HUMIRA PEN 40 MG/0.4ML PNKT Inject 40 mg into the skin every 14 (fourteen) days.     hydrOXYzine (ATARAX/VISTARIL) 25 MG tablet Take 25 mg by mouth every morning.     insulin aspart (NOVOLOG FLEXPEN) 100 UNIT/ML FlexPen Inject 5 Units into the skin 3 (three) times daily with meals. And at bedtime for CBG greater than 200     Methoxy PEG-Epoetin Beta (MIRCERA IJ) Dialysis Monday,Wednesday and friday     metoprolol tartrate (LOPRESSOR) 25 MG tablet Take 0.5 tablets (12.5 mg total) by mouth 2 (two) times daily.     midodrine (PROAMATINE) 5 MG tablet Take 5 mg by mouth 3 (three) times daily with meals. For hypotension     NARCAN 4 MG/0.1ML LIQD nasal spray kit Place 1 spray into the nose as needed (accidental overdose.).     NON FORMULARY BIPAP at bedtime     omega-3 fish oil (MAXEPA) 1000 MG CAPS capsule Take 1,000 mg by mouth daily.     OXYGEN  Inhale 2 L into the lungs continuous.     pantoprazole (PROTONIX) 40 MG tablet Take 1 tablet (40 mg total) by mouth at bedtime.     Pollen Extracts (PROSTAT PO) Take 30 mLs by mouth in the morning and at bedtime.     pramipexole (MIRAPEX) 0.5 MG tablet Take 0.5 mg by mouth daily.   1   rosuvastatin (CRESTOR) 40 MG tablet TAKE 1 TABLET BY MOUTH DAILY *PATIENT NEEDS APPOINTMENT* 30 tablet 10   Sodium Phosphates (RA SALINE ENEMA RE) Place 1 Dose rectally as needed.     zinc sulfate 50 MG CAPS capsule Take 220 mg by mouth daily.     No current facility-administered medications for this visit.     ROS:  See HPI  Physical Exam:  Vitals:   07/02/21 1433  BP: (!) 150/81  Pulse: 85  Temp: 97.9 F (36.6 C)   General:  Chronically ill appearing, not in any distress Incision:  left BKA staples and sutures intact. Sutures removed today. Middle aspect of BKA with serous drainage. Flaps appear viable Extremities:  RLE edematous. Right anterior leg wounds dressed. Neuro: very lethargic. Responds intermittently during interview Abdomen:  obese  Assessment/Plan:  This is a 56 y.o. female who is s/p: left BKA 06/02/21 by Dr. Virl Cagey. BKA is okay appearing. Viable flaps. Sutures removed today. Will keep staples another 1-2 weeks. Recommend elevation of limb. Okay to leave it open to air intermittently. Wash incision daily with mild soap and water or wound cleanser. Per request of patient and her son would like to see Dr. Virl Cagey schedule permitting.    Karoline Caldwell, PA-C [facility] [phone]  Clinic MD:  Scot Dock

## 2021-07-02 ENCOUNTER — Ambulatory Visit (INDEPENDENT_AMBULATORY_CARE_PROVIDER_SITE_OTHER): Payer: Medicare Other | Admitting: Physician Assistant

## 2021-07-02 VITALS — BP 150/81 | HR 85 | Temp 97.9°F

## 2021-07-02 DIAGNOSIS — Z89512 Acquired absence of left leg below knee: Secondary | ICD-10-CM

## 2021-07-05 ENCOUNTER — Other Ambulatory Visit: Payer: Self-pay

## 2021-07-05 ENCOUNTER — Emergency Department (HOSPITAL_COMMUNITY): Payer: Medicare Other

## 2021-07-05 ENCOUNTER — Emergency Department (HOSPITAL_COMMUNITY)
Admission: EM | Admit: 2021-07-05 | Discharge: 2021-07-06 | Disposition: A | Discharge status: Skilled Nursing Facility | Payer: Medicare Other | Attending: Emergency Medicine | Admitting: Emergency Medicine

## 2021-07-05 ENCOUNTER — Encounter (HOSPITAL_COMMUNITY): Payer: Self-pay | Admitting: *Deleted

## 2021-07-05 DIAGNOSIS — N186 End stage renal disease: Secondary | ICD-10-CM | POA: Diagnosis not present

## 2021-07-05 DIAGNOSIS — E871 Hypo-osmolality and hyponatremia: Secondary | ICD-10-CM | POA: Diagnosis not present

## 2021-07-05 DIAGNOSIS — T402X1A Poisoning by other opioids, accidental (unintentional), initial encounter: Secondary | ICD-10-CM | POA: Insufficient documentation

## 2021-07-05 DIAGNOSIS — Z79899 Other long term (current) drug therapy: Secondary | ICD-10-CM | POA: Insufficient documentation

## 2021-07-05 DIAGNOSIS — Z794 Long term (current) use of insulin: Secondary | ICD-10-CM | POA: Diagnosis not present

## 2021-07-05 DIAGNOSIS — R4182 Altered mental status, unspecified: Secondary | ICD-10-CM | POA: Diagnosis present

## 2021-07-05 DIAGNOSIS — E1122 Type 2 diabetes mellitus with diabetic chronic kidney disease: Secondary | ICD-10-CM | POA: Diagnosis not present

## 2021-07-05 DIAGNOSIS — I2510 Atherosclerotic heart disease of native coronary artery without angina pectoris: Secondary | ICD-10-CM | POA: Diagnosis not present

## 2021-07-05 DIAGNOSIS — Z992 Dependence on renal dialysis: Secondary | ICD-10-CM | POA: Diagnosis not present

## 2021-07-05 DIAGNOSIS — Z7901 Long term (current) use of anticoagulants: Secondary | ICD-10-CM | POA: Diagnosis not present

## 2021-07-05 DIAGNOSIS — T402X4A Poisoning by other opioids, undetermined, initial encounter: Secondary | ICD-10-CM

## 2021-07-05 DIAGNOSIS — I120 Hypertensive chronic kidney disease with stage 5 chronic kidney disease or end stage renal disease: Secondary | ICD-10-CM | POA: Insufficient documentation

## 2021-07-05 DIAGNOSIS — I4891 Unspecified atrial fibrillation: Secondary | ICD-10-CM | POA: Insufficient documentation

## 2021-07-05 LAB — CBC WITH DIFFERENTIAL/PLATELET
Abs Immature Granulocytes: 0.02 10*3/uL (ref 0.00–0.07)
Basophils Absolute: 0 10*3/uL (ref 0.0–0.1)
Basophils Relative: 0 %
Eosinophils Absolute: 0.1 10*3/uL (ref 0.0–0.5)
Eosinophils Relative: 2 %
HCT: 33 % — ABNORMAL LOW (ref 36.0–46.0)
Hemoglobin: 9.4 g/dL — ABNORMAL LOW (ref 12.0–15.0)
Immature Granulocytes: 0 %
Lymphocytes Relative: 20 %
Lymphs Abs: 1 10*3/uL (ref 0.7–4.0)
MCH: 30 pg (ref 26.0–34.0)
MCHC: 28.5 g/dL — ABNORMAL LOW (ref 30.0–36.0)
MCV: 105.4 fL — ABNORMAL HIGH (ref 80.0–100.0)
Monocytes Absolute: 0.5 10*3/uL (ref 0.1–1.0)
Monocytes Relative: 9 %
Neutro Abs: 3.5 10*3/uL (ref 1.7–7.7)
Neutrophils Relative %: 69 %
Platelets: UNDETERMINED 10*3/uL (ref 150–400)
RBC: 3.13 MIL/uL — ABNORMAL LOW (ref 3.87–5.11)
RDW: 18.1 % — ABNORMAL HIGH (ref 11.5–15.5)
WBC: 5.2 10*3/uL (ref 4.0–10.5)
nRBC: 0.6 % — ABNORMAL HIGH (ref 0.0–0.2)

## 2021-07-05 LAB — COMPREHENSIVE METABOLIC PANEL
ALT: 21 U/L (ref 0–44)
AST: 30 U/L (ref 15–41)
Albumin: 3 g/dL — ABNORMAL LOW (ref 3.5–5.0)
Alkaline Phosphatase: 225 U/L — ABNORMAL HIGH (ref 38–126)
Anion gap: 15 (ref 5–15)
BUN: 49 mg/dL — ABNORMAL HIGH (ref 6–20)
CO2: 22 mmol/L (ref 22–32)
Calcium: 7.9 mg/dL — ABNORMAL LOW (ref 8.9–10.3)
Chloride: 91 mmol/L — ABNORMAL LOW (ref 98–111)
Creatinine, Ser: 5.72 mg/dL — ABNORMAL HIGH (ref 0.44–1.00)
GFR, Estimated: 8 mL/min — ABNORMAL LOW (ref >60)
Glucose, Bld: 220 mg/dL — ABNORMAL HIGH (ref 70–99)
Potassium: 4.2 mmol/L (ref 3.5–5.1)
Sodium: 128 mmol/L — ABNORMAL LOW (ref 135–145)
Total Bilirubin: 0.9 mg/dL (ref 0.3–1.2)
Total Protein: 7 g/dL (ref 6.5–8.1)

## 2021-07-05 LAB — I-STAT VENOUS BLOOD GAS, ED
Acid-Base Excess: 4 mmol/L — ABNORMAL HIGH (ref 0.0–2.0)
Bicarbonate: 30.5 mmol/L — ABNORMAL HIGH (ref 20.0–28.0)
Calcium, Ion: 0.86 mmol/L — CL (ref 1.15–1.40)
HCT: 34 % — ABNORMAL LOW (ref 36.0–46.0)
Hemoglobin: 11.6 g/dL — ABNORMAL LOW (ref 12.0–15.0)
O2 Saturation: 73 %
Potassium: 4.2 mmol/L (ref 3.5–5.1)
Sodium: 129 mmol/L — ABNORMAL LOW (ref 135–145)
TCO2: 32 mmol/L (ref 22–32)
pCO2, Ven: 52.8 mmHg (ref 44–60)
pH, Ven: 7.37 (ref 7.25–7.43)
pO2, Ven: 41 mmHg (ref 32–45)

## 2021-07-05 MED ORDER — NALOXONE HCL 4 MG/0.1ML NA LIQD
1.0000 | Freq: Once | NASAL | Status: DC
  Filled 2021-07-05: qty 4

## 2021-07-05 MED ORDER — NALOXONE HCL 2 MG/2ML IJ SOSY
1.0000 mg | PREFILLED_SYRINGE | Freq: Once | INTRAMUSCULAR | Status: AC
Start: 2021-07-05 — End: 2021-07-05
  Administered 2021-07-05: 1 mg via INTRAVENOUS
  Filled 2021-07-05: qty 2

## 2021-07-05 NOTE — Discharge Instructions (Addendum)
Please have the facility provider follow-up on the patient's chronic hyponatremia.  The patient was given Narcan in the emergency department and very quickly and rapidly improved her cognitive status and her breathing.  She was speaking to me at the time of discharge.  I strongly recommend that the facility wean her off of narcotics completely, as this appears to be a recurring concern at the time of hospitalization.  The patient CT scan did not show signs of any worsening or new brain bleed.  Her blood work was at baseline aside from the mild ongoing chronic hyponatremia.  Her venous gas did not show evidence of acidosis or hypercapnia.  She was felt to be reasonably safe and stable for discharge back to SNF.

## 2021-07-05 NOTE — ED Notes (Signed)
Pt removed cardiac monitoring wires,requesting to sit on the side of the bed. Pt more alert and following commands; phone provided to contact son

## 2021-07-05 NOTE — ED Provider Notes (Signed)
[facility] EMERGENCY DEPARTMENT Provider Note   CSN: [id number] Arrival date & time: 07/05/21  1923     History {Add pertinent medical, surgical, social history, OB history to HPI:1} Chief Complaint  Patient presents with   Altered Mental Status    [name] is a 56 y.o. female with a complicated history of end-stage renal disease on dialysis, A-fib on Eliquis, coronary disease, heart failure, high blood pressure, diabetes, presenting from [facility] with concern for altered mental status.  The patient has had frequent ED visits and hospitalizations for encephalopathy, some of which were related to opioid overdose, and she has been treated often with Narcan as an outpatient.  On her most recent hospitalization approximately 3 weeks ago, there was a head CT obtained which was concerning for very small 4 mm subdural hemorrhage, and her Eliquis was reversed at the time.  She was admitted on a Narcan drip to the ICU.  Her Eliquis was resumed at the time of discharge.  Patient herself is not able to provide history on arrival as she is somnolent.  HPI     Home Medications Prior to Admission medications   Medication Sig Start Date End Date Taking? Authorizing Provider  acetaminophen (TYLENOL) 500 MG tablet Take 1,000 mg by mouth daily as needed for headache (pain).    [provider]  albuterol (VENTOLIN HFA) 108 (90 Base) MCG/ACT inhaler Inhale 2 puffs into the lungs every 6 (six) hours as needed for wheezing or shortness of breath. 02/22/20   Thurnell Lose, MD  apixaban (ELIQUIS) 5 MG TABS tablet Take 1 tablet (5 mg total) by mouth 2 (two) times daily. 06/11/21   Gherghe, Vella Redhead, MD  AURYXIA 1 GM 210 MG(Fe) tablet Take 420-840 mg by mouth See admin instructions. 840 mg twice daily with meals,  420 mg with a snacks 05/29/18   [provider]  b complex-vitamin c-folic acid (NEPHRO-VITE) 0.8 MG TABS tablet Take 1 tablet by mouth every  Monday, Wednesday, and Friday with hemodialysis. 04/12/19   [provider]  bisacodyl (DULCOLAX) 10 MG suppository Place 10 mg rectally as needed for moderate constipation.    [provider]  clopidogrel (PLAVIX) 75 MG tablet Take 1 tablet (75 mg total) by mouth daily with breakfast. 06/11/21   Caren Griffins, MD  doxycycline (VIBRA-TABS) 100 MG tablet Take 1 tablet (100 mg total) by mouth 2 (two) times daily. 06/18/21   Elgergawy, Silver Huguenin, MD  HUMIRA PEN 40 MG/0.4ML PNKT Inject 40 mg into the skin every 14 (fourteen) days. 08/22/20   [provider]  hydrOXYzine (ATARAX/VISTARIL) 25 MG tablet Take 25 mg by mouth every morning.    [provider]  insulin aspart (NOVOLOG FLEXPEN) 100 UNIT/ML FlexPen Inject 5 Units into the skin 3 (three) times daily with meals. And at bedtime for CBG greater than 200    [provider]  Methoxy PEG-Epoetin Beta (MIRCERA IJ) Dialysis Monday,Wednesday and friday 12/19/20 12/18/21  [provider]  metoprolol tartrate (LOPRESSOR) 25 MG tablet Take 0.5 tablets (12.5 mg total) by mouth 2 (two) times daily. 06/11/21   Caren Griffins, MD  midodrine (PROAMATINE) 5 MG tablet Take 5 mg by mouth 3 (three) times daily with meals. For hypotension    [provider]  NARCAN 4 MG/0.1ML LIQD nasal spray kit Place 1 spray into the nose as needed (accidental overdose.). 10/10/19   [provider]  NON FORMULARY BIPAP at bedtime  [provider]  omega-3 fish oil (MAXEPA) 1000 MG CAPS capsule Take 1,000 mg by mouth daily.    [provider]  OXYGEN Inhale 2 L into the lungs continuous.    [provider]  pantoprazole (PROTONIX) 40 MG tablet Take 1 tablet (40 mg total) by mouth at bedtime. 06/18/21   Elgergawy, Silver Huguenin, MD  Pollen Extracts (PROSTAT PO) Take 30 mLs by mouth in the morning and at bedtime.    [provider]  pramipexole (MIRAPEX) 0.5 MG tablet Take 0.5 mg by  mouth daily.  05/18/17   [provider]  rosuvastatin (CRESTOR) 40 MG tablet TAKE 1 TABLET BY MOUTH DAILY *PATIENT NEEDS APPOINTMENT* 12/08/20   Minus Breeding, MD  Sodium Phosphates (RA SALINE ENEMA RE) Place 1 Dose rectally as needed.    [provider]  zinc sulfate 50 MG CAPS capsule Take 220 mg by mouth daily.    [provider]      Allergies    Bupropion and Dilaudid [hydromorphone]    Review of Systems   Review of Systems  Physical Exam Updated Vital Signs BP (!) 112/40   Pulse (!) 102   Temp 97.6 F (36.4 C) (Oral)   Resp (!) 22   LMP  (LMP Unknown) Comment: LMP Feb 2011 Physical Exam Constitutional:      Comments: Snoring, somnolent, arouses to voice  HENT:     Head: Normocephalic and atraumatic.  Eyes:     Conjunctiva/sclera: Conjunctivae normal.     Comments: Pinpoint pupils bilaterally.  Cardiovascular:     Rate and Rhythm: Regular rhythm. Tachycardia present.  Pulmonary:     Effort: Pulmonary effort is normal. No respiratory distress.  Abdominal:     General: There is no distension.     Tenderness: There is no abdominal tenderness.  Musculoskeletal:     Comments: Bilateral lower extremity amputations  Skin:    General: Skin is warm and dry.  Neurological:     Mental Status: She is alert and oriented to person, place, and time. Mental status is at baseline.  Psychiatric:        Mood and Affect: Mood normal.        Behavior: Behavior normal.    ED Results / Procedures / Treatments   Labs (all labs ordered are listed, but only abnormal results are displayed) Labs Reviewed  URINALYSIS, ROUTINE W REFLEX MICROSCOPIC  CBC WITH DIFFERENTIAL/PLATELET  COMPREHENSIVE METABOLIC PANEL    EKG None  Radiology No results found.  Procedures Procedures  {Document cardiac monitor, telemetry assessment procedure when appropriate:1}  Medications Ordered in ED Medications - No data to display  ED Course/ Medical Decision Making/  A&P                           Medical Decision Making Risk Prescription drug management.   This patient presents to the Emergency Department with complaint of altered mental status.  This involves an extensive number of treatment options, and is a complaint that carries with it a high risk of complications and morbidity.  The differential diagnosis includes hypoglycemia vs metabolic encephalopathy vs infection (including cystitis) vs ICH vs stroke vs polypharmacy vs other  Patient arrives with pinpoint pupils, per my review of the medical records, she has had frequent ED visits with concern for polypharmacy and opioid overuse.  I have ordered Narcan to be given intranasally for reversal.  PDMP shows percocet last filled April 2023,  10 mg tablets.  At the time of her last hospital discharge they recommended reduction to 5 mg tablets.  Patient did have a small subdural bleed on her most recent hospitalization, and given her somnolence she will need a repeat CT scan of her head.  She is continued on Eliquis.  She is otherwise bedbound, and there is no report of head trauma today.  I ordered, reviewed, and interpreted labs, including *** I ordered medication *** for *** I ordered imaging studies which included x-ray of the chest, CT of the head I independently visualized and interpreted imaging which showed *** and the monitor tracing which showed *** Additional history was obtained from EMS on arrival External records reviewed including hospital course and discharge summary from May   I consulted *** and discussed lab and imaging findings  After the interventions stated above, I reevaluated the patient and found ***    {Document critical care time when appropriate:1} {Document review of labs and clinical decision tools ie heart score, Chads2Vasc2 etc:1}  {Document your independent review of radiology images, and any outside records:1} {Document your discussion with family members,  caretakers, and with consultants:1} {Document social determinants of health affecting pt's care:1} {Document your decision making why or why not admission, treatments were needed:1} Final Clinical Impression(s) / ED Diagnoses Final diagnoses:  None    Rx / DC Orders ED Discharge Orders     None

## 2021-07-05 NOTE — ED Triage Notes (Signed)
Pt arrived from [facility] via EMS for AMS. Pt has been seen several other times for same. Staff at facility had difficult time waking up pt up. She is on Home oxygen at 3L, also has R BKA. Pt very drowsy, wakes up to anwer orientation questions then falls back to sleep. Difficult to obtain pulse ox reading (poor circulation to extremities)

## 2021-07-07 NOTE — Progress Notes (Signed)
POST OPERATIVE OFFICE NOTE    CC:  F/u for surgery  HPI:  This is a 56 y.o. 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 Virl Cagey. 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   She was seen on 6/1.  At that time, pt was lethargic and falling asleep.  She was unsure how often they were cleaning her wound.  Her dressing was somewhat saturated.  The stump was viable.  Sutures were removed and staples left in place.  She was residing in SNF and anticipating dc home in the next couple of weeks.   She has a history of finger loss due to non healing wounds with osteomyelitis.  She has ESRD and is on HD at the [place] location M/W/F.  Pt returns today for follow up with her son.  Pt states that she went home from the SNF yesterday.  She bumped her stump last night.  She denies any fever or chills.    Allergies  Allergen Reactions   Bupropion Itching   Dilaudid [Hydromorphone] Other (See Comments)    Apnea, required intubation    Current Outpatient Medications  Medication Sig Dispense Refill   acetaminophen (TYLENOL) 500 MG tablet Take 1,000 mg by mouth daily as needed for headache (pain).     albuterol (VENTOLIN HFA) 108 (90 Base) MCG/ACT inhaler Inhale 2 puffs into the lungs every 6 (six) hours as needed for wheezing or shortness of breath. 6.7 g 0   apixaban (ELIQUIS) 5 MG TABS tablet Take 1 tablet (5 mg total) by mouth 2 (two) times daily. 60 tablet    AURYXIA 1 GM 210 MG(Fe) tablet Take 420-840 mg by mouth See admin instructions. 840 mg twice daily with meals,  420 mg with a snacks     b complex-vitamin c-folic acid (NEPHRO-VITE) 0.8 MG TABS tablet Take 1 tablet by mouth every Monday, Wednesday, and Friday with hemodialysis.     bisacodyl (DULCOLAX) 10 MG suppository Place 10 mg rectally as needed for moderate constipation.     clopidogrel (PLAVIX) 75 MG tablet Take 1 tablet (75 mg total) by mouth daily with breakfast.      doxycycline (VIBRA-TABS) 100 MG tablet Take 1 tablet (100 mg total) by mouth 2 (two) times daily. 14 tablet 0   HUMIRA PEN 40 MG/0.4ML PNKT Inject 40 mg into the skin every 14 (fourteen) days.     hydrOXYzine (ATARAX/VISTARIL) 25 MG tablet Take 25 mg by mouth every morning.     insulin aspart (NOVOLOG FLEXPEN) 100 UNIT/ML FlexPen Inject 5 Units into the skin 3 (three) times daily with meals. And at bedtime for CBG greater than 200     Methoxy PEG-Epoetin Beta (MIRCERA IJ) Dialysis Monday,Wednesday and friday     metoprolol tartrate (LOPRESSOR) 25 MG tablet Take 0.5 tablets (12.5 mg total) by mouth 2 (two) times daily.     midodrine (PROAMATINE) 5 MG tablet Take 5 mg by mouth 3 (three) times daily with meals. For hypotension     NARCAN 4 MG/0.1ML LIQD nasal spray kit Place 1 spray into the nose as needed (accidental overdose.).     NON FORMULARY BIPAP at bedtime     omega-3 fish oil (MAXEPA) 1000 MG CAPS capsule Take 1,000 mg by mouth daily.     OXYGEN Inhale 2 L into the lungs continuous.     pantoprazole (PROTONIX) 40 MG tablet Take 1 tablet (40 mg total) by mouth at bedtime.  Pollen Extracts (PROSTAT PO) Take 30 mLs by mouth in the morning and at bedtime.     pramipexole (MIRAPEX) 0.5 MG tablet Take 0.5 mg by mouth daily.   1   rosuvastatin (CRESTOR) 40 MG tablet TAKE 1 TABLET BY MOUTH DAILY *PATIENT NEEDS APPOINTMENT* 30 tablet 10   Sodium Phosphates (RA SALINE ENEMA RE) Place 1 Dose rectally as needed.     zinc sulfate 50 MG CAPS capsule Take 220 mg by mouth daily.     No current facility-administered medications for this visit.     ROS:  See HPI  Physical Exam:  Today's Vitals   07/10/21 0852  BP: 120/70  PainSc: 3    There is no height or weight on file to calculate BMI.   Incision:        Assessment/Plan:  This is a 56 y.o. female who is s/p: left below knee amputation on 06/02/21 by Dr. Virl Cagey  -pt seen with Dr. Virl Cagey.  He recommends leaving staples in for 2 more  weeks.  There is significant edema in the stump and therefore, wants a gentle ace wrap compression on stump with dry dressing.  She will return in 2 weeks for incision check and possibly remove staples.  There is one nylon left in the incision laterally that will also need to be removed.  -they do have [place] coming out-recommend wrapping with ace wrap daily -they will call sooner if there are any issues.    Leontine Locket, [facility] [phone]   Clinic MD:  Virl Cagey

## 2021-07-10 ENCOUNTER — Ambulatory Visit (INDEPENDENT_AMBULATORY_CARE_PROVIDER_SITE_OTHER): Payer: Medicare Other | Admitting: Physician Assistant

## 2021-07-10 ENCOUNTER — Encounter: Payer: Self-pay | Admitting: Physician Assistant

## 2021-07-10 VITALS — BP 120/70

## 2021-07-10 DIAGNOSIS — Z89512 Acquired absence of left leg below knee: Secondary | ICD-10-CM

## 2021-07-13 ENCOUNTER — Other Ambulatory Visit (HOSPITAL_BASED_OUTPATIENT_CLINIC_OR_DEPARTMENT_OTHER): Payer: Self-pay

## 2021-07-14 ENCOUNTER — Other Ambulatory Visit (HOSPITAL_BASED_OUTPATIENT_CLINIC_OR_DEPARTMENT_OTHER): Payer: Self-pay

## 2021-07-20 ENCOUNTER — Emergency Department (HOSPITAL_COMMUNITY): Payer: Medicare Other

## 2021-07-20 ENCOUNTER — Other Ambulatory Visit: Payer: Self-pay

## 2021-07-20 ENCOUNTER — Inpatient Hospital Stay (HOSPITAL_COMMUNITY): Payer: Medicare Other

## 2021-07-20 ENCOUNTER — Encounter (HOSPITAL_COMMUNITY): Payer: Self-pay | Admitting: Internal Medicine

## 2021-07-20 ENCOUNTER — Inpatient Hospital Stay (HOSPITAL_COMMUNITY): Payer: Medicare Other | Admitting: Certified Registered"

## 2021-07-20 ENCOUNTER — Inpatient Hospital Stay (HOSPITAL_COMMUNITY)
Admission: EM | Admit: 2021-07-20 | Discharge: 2021-08-01 | DRG: 500 | Disposition: E | Payer: Medicare Other | Attending: Pulmonary Disease | Admitting: Pulmonary Disease

## 2021-07-20 ENCOUNTER — Encounter (HOSPITAL_COMMUNITY): Admission: EM | Disposition: E | Payer: Self-pay | Source: Home / Self Care | Attending: Pulmonary Disease

## 2021-07-20 DIAGNOSIS — E1122 Type 2 diabetes mellitus with diabetic chronic kidney disease: Secondary | ICD-10-CM | POA: Diagnosis present

## 2021-07-20 DIAGNOSIS — T8789 Other complications of amputation stump: Secondary | ICD-10-CM | POA: Diagnosis present

## 2021-07-20 DIAGNOSIS — Y92239 Unspecified place in hospital as the place of occurrence of the external cause: Secondary | ICD-10-CM | POA: Diagnosis not present

## 2021-07-20 DIAGNOSIS — I2510 Atherosclerotic heart disease of native coronary artery without angina pectoris: Secondary | ICD-10-CM | POA: Diagnosis not present

## 2021-07-20 DIAGNOSIS — I120 Hypertensive chronic kidney disease with stage 5 chronic kidney disease or end stage renal disease: Secondary | ICD-10-CM | POA: Diagnosis present

## 2021-07-20 DIAGNOSIS — M069 Rheumatoid arthritis, unspecified: Secondary | ICD-10-CM | POA: Diagnosis present

## 2021-07-20 DIAGNOSIS — Z885 Allergy status to narcotic agent status: Secondary | ICD-10-CM

## 2021-07-20 DIAGNOSIS — Z9981 Dependence on supplemental oxygen: Secondary | ICD-10-CM

## 2021-07-20 DIAGNOSIS — I132 Hypertensive heart and chronic kidney disease with heart failure and with stage 5 chronic kidney disease, or end stage renal disease: Secondary | ICD-10-CM

## 2021-07-20 DIAGNOSIS — T8781 Dehiscence of amputation stump: Secondary | ICD-10-CM | POA: Diagnosis present

## 2021-07-20 DIAGNOSIS — N186 End stage renal disease: Secondary | ICD-10-CM

## 2021-07-20 DIAGNOSIS — E1169 Type 2 diabetes mellitus with other specified complication: Secondary | ICD-10-CM | POA: Diagnosis present

## 2021-07-20 DIAGNOSIS — I340 Nonrheumatic mitral (valve) insufficiency: Secondary | ICD-10-CM | POA: Diagnosis present

## 2021-07-20 DIAGNOSIS — Z515 Encounter for palliative care: Secondary | ICD-10-CM | POA: Diagnosis not present

## 2021-07-20 DIAGNOSIS — B965 Pseudomonas (aeruginosa) (mallei) (pseudomallei) as the cause of diseases classified elsewhere: Secondary | ICD-10-CM | POA: Diagnosis present

## 2021-07-20 DIAGNOSIS — Z992 Dependence on renal dialysis: Secondary | ICD-10-CM

## 2021-07-20 DIAGNOSIS — Z8616 Personal history of COVID-19: Secondary | ICD-10-CM

## 2021-07-20 DIAGNOSIS — Z7901 Long term (current) use of anticoagulants: Secondary | ICD-10-CM

## 2021-07-20 DIAGNOSIS — I272 Pulmonary hypertension, unspecified: Secondary | ICD-10-CM | POA: Diagnosis present

## 2021-07-20 DIAGNOSIS — J9601 Acute respiratory failure with hypoxia: Secondary | ICD-10-CM | POA: Diagnosis not present

## 2021-07-20 DIAGNOSIS — B9562 Methicillin resistant Staphylococcus aureus infection as the cause of diseases classified elsewhere: Secondary | ICD-10-CM | POA: Diagnosis present

## 2021-07-20 DIAGNOSIS — G4733 Obstructive sleep apnea (adult) (pediatric): Secondary | ICD-10-CM | POA: Diagnosis present

## 2021-07-20 DIAGNOSIS — Z6841 Body Mass Index (BMI) 40.0 and over, adult: Secondary | ICD-10-CM

## 2021-07-20 DIAGNOSIS — J811 Chronic pulmonary edema: Secondary | ICD-10-CM | POA: Diagnosis not present

## 2021-07-20 DIAGNOSIS — Z83438 Family history of other disorder of lipoprotein metabolism and other lipidemia: Secondary | ICD-10-CM

## 2021-07-20 DIAGNOSIS — Z79899 Other long term (current) drug therapy: Secondary | ICD-10-CM

## 2021-07-20 DIAGNOSIS — F17210 Nicotine dependence, cigarettes, uncomplicated: Secondary | ICD-10-CM | POA: Diagnosis not present

## 2021-07-20 DIAGNOSIS — R7989 Other specified abnormal findings of blood chemistry: Secondary | ICD-10-CM | POA: Diagnosis present

## 2021-07-20 DIAGNOSIS — Z794 Long term (current) use of insulin: Secondary | ICD-10-CM

## 2021-07-20 DIAGNOSIS — M898X9 Other specified disorders of bone, unspecified site: Secondary | ICD-10-CM | POA: Diagnosis present

## 2021-07-20 DIAGNOSIS — E785 Hyperlipidemia, unspecified: Secondary | ICD-10-CM | POA: Diagnosis present

## 2021-07-20 DIAGNOSIS — E8770 Fluid overload, unspecified: Secondary | ICD-10-CM | POA: Diagnosis not present

## 2021-07-20 DIAGNOSIS — T4275XA Adverse effect of unspecified antiepileptic and sedative-hypnotic drugs, initial encounter: Secondary | ICD-10-CM | POA: Diagnosis not present

## 2021-07-20 DIAGNOSIS — Y835 Amputation of limb(s) as the cause of abnormal reaction of the patient, or of later complication, without mention of misadventure at the time of the procedure: Secondary | ICD-10-CM | POA: Diagnosis present

## 2021-07-20 DIAGNOSIS — E1151 Type 2 diabetes mellitus with diabetic peripheral angiopathy without gangrene: Secondary | ICD-10-CM

## 2021-07-20 DIAGNOSIS — Z66 Do not resuscitate: Secondary | ICD-10-CM | POA: Diagnosis not present

## 2021-07-20 DIAGNOSIS — G9341 Metabolic encephalopathy: Secondary | ICD-10-CM

## 2021-07-20 DIAGNOSIS — E1165 Type 2 diabetes mellitus with hyperglycemia: Secondary | ICD-10-CM | POA: Diagnosis present

## 2021-07-20 DIAGNOSIS — Z833 Family history of diabetes mellitus: Secondary | ICD-10-CM

## 2021-07-20 DIAGNOSIS — R6521 Severe sepsis with septic shock: Secondary | ICD-10-CM

## 2021-07-20 DIAGNOSIS — I7121 Aneurysm of the ascending aorta, without rupture: Secondary | ICD-10-CM | POA: Diagnosis present

## 2021-07-20 DIAGNOSIS — Z8673 Personal history of transient ischemic attack (TIA), and cerebral infarction without residual deficits: Secondary | ICD-10-CM

## 2021-07-20 DIAGNOSIS — Z79620 Long term (current) use of immunosuppressive biologic: Secondary | ICD-10-CM

## 2021-07-20 DIAGNOSIS — E7800 Pure hypercholesterolemia, unspecified: Secondary | ICD-10-CM | POA: Diagnosis present

## 2021-07-20 DIAGNOSIS — Z888 Allergy status to other drugs, medicaments and biological substances status: Secondary | ICD-10-CM

## 2021-07-20 DIAGNOSIS — R197 Diarrhea, unspecified: Secondary | ICD-10-CM | POA: Diagnosis not present

## 2021-07-20 DIAGNOSIS — N2581 Secondary hyperparathyroidism of renal origin: Secondary | ICD-10-CM | POA: Diagnosis present

## 2021-07-20 DIAGNOSIS — I4510 Unspecified right bundle-branch block: Secondary | ICD-10-CM | POA: Diagnosis present

## 2021-07-20 DIAGNOSIS — J984 Other disorders of lung: Secondary | ICD-10-CM | POA: Diagnosis present

## 2021-07-20 DIAGNOSIS — D631 Anemia in chronic kidney disease: Secondary | ICD-10-CM | POA: Diagnosis present

## 2021-07-20 DIAGNOSIS — Z8249 Family history of ischemic heart disease and other diseases of the circulatory system: Secondary | ICD-10-CM

## 2021-07-20 DIAGNOSIS — E1140 Type 2 diabetes mellitus with diabetic neuropathy, unspecified: Secondary | ICD-10-CM | POA: Diagnosis present

## 2021-07-20 DIAGNOSIS — Z87892 Personal history of anaphylaxis: Secondary | ICD-10-CM

## 2021-07-20 DIAGNOSIS — I480 Paroxysmal atrial fibrillation: Secondary | ICD-10-CM | POA: Diagnosis present

## 2021-07-20 DIAGNOSIS — G2581 Restless legs syndrome: Secondary | ICD-10-CM | POA: Diagnosis present

## 2021-07-20 DIAGNOSIS — I739 Peripheral vascular disease, unspecified: Secondary | ICD-10-CM | POA: Diagnosis present

## 2021-07-20 DIAGNOSIS — T8740 Infection of amputation stump, unspecified extremity: Secondary | ICD-10-CM | POA: Diagnosis not present

## 2021-07-20 DIAGNOSIS — A419 Sepsis, unspecified organism: Secondary | ICD-10-CM | POA: Diagnosis not present

## 2021-07-20 DIAGNOSIS — E11649 Type 2 diabetes mellitus with hypoglycemia without coma: Secondary | ICD-10-CM | POA: Diagnosis not present

## 2021-07-20 DIAGNOSIS — J9602 Acute respiratory failure with hypercapnia: Secondary | ICD-10-CM | POA: Diagnosis not present

## 2021-07-20 DIAGNOSIS — N289 Disorder of kidney and ureter, unspecified: Secondary | ICD-10-CM | POA: Diagnosis present

## 2021-07-20 DIAGNOSIS — I482 Chronic atrial fibrillation, unspecified: Secondary | ICD-10-CM | POA: Diagnosis present

## 2021-07-20 DIAGNOSIS — F419 Anxiety disorder, unspecified: Secondary | ICD-10-CM | POA: Diagnosis present

## 2021-07-20 DIAGNOSIS — Z818 Family history of other mental and behavioral disorders: Secondary | ICD-10-CM

## 2021-07-20 DIAGNOSIS — T8744 Infection of amputation stump, left lower extremity: Principal | ICD-10-CM | POA: Diagnosis present

## 2021-07-20 DIAGNOSIS — Z823 Family history of stroke: Secondary | ICD-10-CM

## 2021-07-20 DIAGNOSIS — I9589 Other hypotension: Secondary | ICD-10-CM | POA: Diagnosis present

## 2021-07-20 DIAGNOSIS — Z7902 Long term (current) use of antithrombotics/antiplatelets: Secondary | ICD-10-CM

## 2021-07-20 DIAGNOSIS — I472 Ventricular tachycardia, unspecified: Secondary | ICD-10-CM | POA: Diagnosis not present

## 2021-07-20 HISTORY — PX: AMPUTATION: SHX166

## 2021-07-20 LAB — SALICYLATE LEVEL: Salicylate Lvl: <7 mg/dL — ABNORMAL LOW (ref 7.0–30.0)

## 2021-07-20 LAB — POCT I-STAT 7, (LYTES, BLD GAS, ICA,H+H)
Acid-Base Excess: 3 mmol/L — ABNORMAL HIGH (ref 0.0–2.0)
Bicarbonate: 30.1 mmol/L — ABNORMAL HIGH (ref 20.0–28.0)
Calcium, Ion: 1.17 mmol/L (ref 1.15–1.40)
HCT: 29 % — ABNORMAL LOW (ref 36.0–46.0)
Hemoglobin: 9.9 g/dL — ABNORMAL LOW (ref 12.0–15.0)
O2 Saturation: 100 %
Patient temperature: 97.6
Potassium: 3.7 mmol/L (ref 3.5–5.1)
Sodium: 138 mmol/L (ref 135–145)
TCO2: 32 mmol/L (ref 22–32)
pCO2 arterial: 53.9 mmHg — ABNORMAL HIGH (ref 32–48)
pH, Arterial: 7.352 (ref 7.35–7.45)
pO2, Arterial: 341 mmHg — ABNORMAL HIGH (ref 83–108)

## 2021-07-20 LAB — CBC WITH DIFFERENTIAL/PLATELET
Abs Immature Granulocytes: 0.09 K/uL — ABNORMAL HIGH (ref 0.00–0.07)
Basophils Absolute: 0 K/uL (ref 0.0–0.1)
Basophils Relative: 1 %
Eosinophils Absolute: 0.4 K/uL (ref 0.0–0.5)
Eosinophils Relative: 5 %
HCT: 31.7 % — ABNORMAL LOW (ref 36.0–46.0)
Hemoglobin: 9.4 g/dL — ABNORMAL LOW (ref 12.0–15.0)
Immature Granulocytes: 1 %
Lymphocytes Relative: 13 %
Lymphs Abs: 1 K/uL (ref 0.7–4.0)
MCH: 29.7 pg (ref 26.0–34.0)
MCHC: 29.7 g/dL — ABNORMAL LOW (ref 30.0–36.0)
MCV: 100 fL (ref 80.0–100.0)
Monocytes Absolute: 0.5 K/uL (ref 0.1–1.0)
Monocytes Relative: 6 %
Neutro Abs: 6.1 K/uL (ref 1.7–7.7)
Neutrophils Relative %: 74 %
Platelets: 222 K/uL (ref 150–400)
RBC: 3.17 MIL/uL — ABNORMAL LOW (ref 3.87–5.11)
RDW: 17.2 % — ABNORMAL HIGH (ref 11.5–15.5)
WBC: 8.1 K/uL (ref 4.0–10.5)
nRBC: 0 % (ref 0.0–0.2)

## 2021-07-20 LAB — I-STAT ARTERIAL BLOOD GAS, ED
Acid-Base Excess: 5 mmol/L — ABNORMAL HIGH (ref 0.0–2.0)
Bicarbonate: 32.8 mmol/L — ABNORMAL HIGH (ref 20.0–28.0)
Calcium, Ion: 1.26 mmol/L (ref 1.15–1.40)
HCT: 30 % — ABNORMAL LOW (ref 36.0–46.0)
Hemoglobin: 10.2 g/dL — ABNORMAL LOW (ref 12.0–15.0)
O2 Saturation: 90 %
Patient temperature: 98.3
Potassium: 3.6 mmol/L (ref 3.5–5.1)
Sodium: 139 mmol/L (ref 135–145)
TCO2: 35 mmol/L — ABNORMAL HIGH (ref 22–32)
pCO2 arterial: 64.4 mmHg — ABNORMAL HIGH (ref 32–48)
pH, Arterial: 7.315 — ABNORMAL LOW (ref 7.35–7.45)
pO2, Arterial: 66 mmHg — ABNORMAL LOW (ref 83–108)

## 2021-07-20 LAB — GLUCOSE, CAPILLARY
Glucose-Capillary: 100 mg/dL — ABNORMAL HIGH (ref 70–99)
Glucose-Capillary: 52 mg/dL — ABNORMAL LOW (ref 70–99)
Glucose-Capillary: 76 mg/dL (ref 70–99)
Glucose-Capillary: 79 mg/dL (ref 70–99)
Glucose-Capillary: 97 mg/dL (ref 70–99)

## 2021-07-20 LAB — COMPREHENSIVE METABOLIC PANEL WITH GFR
ALT: 16 U/L (ref 0–44)
AST: 15 U/L (ref 15–41)
Albumin: 2.3 g/dL — ABNORMAL LOW (ref 3.5–5.0)
Alkaline Phosphatase: 188 U/L — ABNORMAL HIGH (ref 38–126)
Anion gap: 14 (ref 5–15)
BUN: 44 mg/dL — ABNORMAL HIGH (ref 6–20)
CO2: 28 mmol/L (ref 22–32)
Calcium: 9.5 mg/dL (ref 8.9–10.3)
Chloride: 97 mmol/L — ABNORMAL LOW (ref 98–111)
Creatinine, Ser: 6.78 mg/dL — ABNORMAL HIGH (ref 0.44–1.00)
GFR, Estimated: 7 mL/min — ABNORMAL LOW (ref >60)
Glucose, Bld: 84 mg/dL (ref 70–99)
Potassium: 3.8 mmol/L (ref 3.5–5.1)
Sodium: 139 mmol/L (ref 135–145)
Total Bilirubin: 0.9 mg/dL (ref 0.3–1.2)
Total Protein: 7.5 g/dL (ref 6.5–8.1)

## 2021-07-20 LAB — ETHANOL: Alcohol, Ethyl (B): <10 mg/dL (ref <10)

## 2021-07-20 LAB — CBG MONITORING, ED: Glucose-Capillary: 86 mg/dL (ref 70–99)

## 2021-07-20 LAB — SURGICAL PCR SCREEN
MRSA, PCR: POSITIVE — AB
Staphylococcus aureus: POSITIVE — AB

## 2021-07-20 LAB — ACETAMINOPHEN LEVEL: Acetaminophen (Tylenol), Serum: <10 ug/mL — ABNORMAL LOW (ref 10–30)

## 2021-07-20 LAB — LACTIC ACID, PLASMA
Lactic Acid, Venous: 1.3 mmol/L (ref 0.5–1.9)
Lactic Acid, Venous: 1.2 mmol/L (ref 0.5–1.9)

## 2021-07-20 LAB — AMMONIA: Ammonia: 76 umol/L — ABNORMAL HIGH (ref 9–35)

## 2021-07-20 SURGERY — AMPUTATION BELOW KNEE
Anesthesia: General | Site: Knee | Laterality: Left

## 2021-07-20 MED ORDER — INSULIN ASPART 100 UNIT/ML IJ SOLN
0.0000 [IU] | INTRAMUSCULAR | Status: DC
  Administered 2021-07-21: 1 [IU] via SUBCUTANEOUS
  Administered 2021-07-22: 3 [IU] via SUBCUTANEOUS
  Administered 2021-07-22 (×2): 2 [IU] via SUBCUTANEOUS

## 2021-07-20 MED ORDER — ACETAMINOPHEN 650 MG RE SUPP: 650.0000 mg | Freq: Four times a day (QID) | RECTAL | Status: DC | PRN

## 2021-07-20 MED ORDER — VASOPRESSIN 20 UNIT/ML IV SOLN
INTRAVENOUS | Status: AC
Start: 2021-07-20 — End: ?
  Filled 2021-07-20: qty 1

## 2021-07-20 MED ORDER — ONDANSETRON HCL 4 MG/2ML IJ SOLN
INTRAMUSCULAR | Status: AC
Start: 2021-07-20 — End: ?
  Filled 2021-07-20: qty 2

## 2021-07-20 MED ORDER — CALCIUM CARBONATE ANTACID 1250 MG/5ML PO SUSP: 500.0000 mg | Freq: Four times a day (QID) | ORAL | Status: DC | PRN

## 2021-07-20 MED ORDER — SODIUM CHLORIDE 0.9 % IV BOLUS
250.0000 mL | Freq: Once | INTRAVENOUS | Status: AC
  Administered 2021-07-20: 250 mL via INTRAVENOUS

## 2021-07-20 MED ORDER — NEPRO/CARBSTEADY PO LIQD: 237.0000 mL | Freq: Three times a day (TID) | ORAL | Status: DC | PRN

## 2021-07-20 MED ORDER — ONDANSETRON HCL 4 MG/2ML IJ SOLN: 4.0000 mg | Freq: Four times a day (QID) | INTRAMUSCULAR | Status: DC | PRN

## 2021-07-20 MED ORDER — HYDRALAZINE HCL 20 MG/ML IJ SOLN: 5.0000 mg | INTRAMUSCULAR | Status: DC | PRN

## 2021-07-20 MED ORDER — MIDODRINE HCL 5 MG PO TABS
10.0000 mg | ORAL_TABLET | Freq: Three times a day (TID) | ORAL | Status: DC
  Filled 2021-07-20: qty 2

## 2021-07-20 MED ORDER — DOCUSATE SODIUM 283 MG RE ENEM: 1.0000 | ENEMA | RECTAL | Status: DC | PRN

## 2021-07-20 MED ORDER — DOXERCALCIFEROL 4 MCG/2ML IV SOLN
9.0000 ug | INTRAVENOUS | Status: DC
Start: 2021-07-22 — End: 2021-07-20

## 2021-07-20 MED ORDER — LACTULOSE ENEMA
300.0000 mL | Freq: Four times a day (QID) | RECTAL | Status: DC | PRN
Start: 2021-07-20 — End: 2021-07-25

## 2021-07-20 MED ORDER — ALBUTEROL SULFATE (2.5 MG/3ML) 0.083% IN NEBU: 2.5000 mg | INHALATION_SOLUTION | RESPIRATORY_TRACT | Status: DC | PRN

## 2021-07-20 MED ORDER — ORAL CARE MOUTH RINSE: 15.0000 mL | OROMUCOSAL | Status: DC | PRN

## 2021-07-20 MED ORDER — ORAL CARE MOUTH RINSE
15.0000 mL | OROMUCOSAL | Status: DC
  Administered 2021-07-20 – 2021-07-24 (×48): 15 mL via OROMUCOSAL

## 2021-07-20 MED ORDER — PROPOFOL 10 MG/ML IV BOLUS
INTRAVENOUS | Status: DC | PRN
  Administered 2021-07-20: 30 mg via INTRAVENOUS

## 2021-07-20 MED ORDER — PHENYLEPHRINE 80 MCG/ML (10ML) SYRINGE FOR IV PUSH (FOR BLOOD PRESSURE SUPPORT)
PREFILLED_SYRINGE | INTRAVENOUS | Status: AC
  Filled 2021-07-20: qty 10

## 2021-07-20 MED ORDER — DOCUSATE SODIUM 50 MG/5ML PO LIQD
100.0000 mg | Freq: Two times a day (BID) | ORAL | Status: DC
  Administered 2021-07-22 – 2021-07-24 (×4): 100 mg
  Filled 2021-07-20 (×4): qty 10

## 2021-07-20 MED ORDER — PANTOPRAZOLE SODIUM 40 MG IV SOLR
40.0000 mg | INTRAVENOUS | Status: DC
  Administered 2021-07-20: 40 mg via INTRAVENOUS
  Filled 2021-07-20: qty 10

## 2021-07-20 MED ORDER — LIDOCAINE 2% (20 MG/ML) 5 ML SYRINGE
INTRAMUSCULAR | Status: AC
  Filled 2021-07-20: qty 15

## 2021-07-20 MED ORDER — NALOXONE HCL 2 MG/2ML IJ SOSY
2.0000 mg | PREFILLED_SYRINGE | Freq: Once | INTRAMUSCULAR | Status: AC
Start: 2021-07-20 — End: 2021-07-20
  Administered 2021-07-20: 2 mg via INTRAVENOUS
  Filled 2021-07-20: qty 2

## 2021-07-20 MED ORDER — SODIUM CHLORIDE 0.9% FLUSH: 10.0000 mL | INTRAVENOUS | Status: DC | PRN

## 2021-07-20 MED ORDER — ROCURONIUM BROMIDE 10 MG/ML (PF) SYRINGE
PREFILLED_SYRINGE | INTRAVENOUS | Status: AC
  Filled 2021-07-20: qty 20

## 2021-07-20 MED ORDER — ROCURONIUM BROMIDE 10 MG/ML (PF) SYRINGE
PREFILLED_SYRINGE | INTRAVENOUS | Status: AC
  Filled 2021-07-20: qty 10

## 2021-07-20 MED ORDER — PROPOFOL 1000 MG/100ML IV EMUL
5.0000 ug/kg/min | INTRAVENOUS | Status: DC
  Administered 2021-07-20: 60 ug/kg/min via INTRAVENOUS
  Administered 2021-07-20 – 2021-07-21 (×3): 45 ug/kg/min via INTRAVENOUS
  Administered 2021-07-21: 30 ug/kg/min via INTRAVENOUS
  Filled 2021-07-20 (×6): qty 100

## 2021-07-20 MED ORDER — SODIUM CHLORIDE 0.9 % IV SOLN: INTRAVENOUS | Status: DC | PRN

## 2021-07-20 MED ORDER — CHLORHEXIDINE GLUCONATE CLOTH 2 % EX PADS
6.0000 | MEDICATED_PAD | Freq: Every day | CUTANEOUS | Status: DC
  Administered 2021-07-20 – 2021-07-23 (×2): 6 via TOPICAL

## 2021-07-20 MED ORDER — CHLORHEXIDINE GLUCONATE 0.12 % MT SOLN: 15.0000 mL | Freq: Once | OROMUCOSAL | Status: AC

## 2021-07-20 MED ORDER — DEXTROSE 50 % IV SOLN
INTRAVENOUS | Status: AC
  Administered 2021-07-20: 25 g via INTRAVENOUS
  Filled 2021-07-20: qty 50

## 2021-07-20 MED ORDER — PENTAFLUOROPROP-TETRAFLUOROETH EX AERO: 1.0000 | INHALATION_SPRAY | CUTANEOUS | Status: DC | PRN

## 2021-07-20 MED ORDER — POLYETHYLENE GLYCOL 3350 17 G PO PACK
17.0000 g | PACK | Freq: Every day | ORAL | Status: DC
Start: 2021-07-20 — End: 2021-07-25
  Filled 2021-07-20: qty 1

## 2021-07-20 MED ORDER — ACETAMINOPHEN 10 MG/ML IV SOLN
INTRAVENOUS | Status: AC
  Filled 2021-07-20: qty 100

## 2021-07-20 MED ORDER — ONDANSETRON HCL 4 MG PO TABS: 4.0000 mg | ORAL_TABLET | Freq: Four times a day (QID) | ORAL | Status: DC | PRN

## 2021-07-20 MED ORDER — SODIUM CHLORIDE 0.9 % IV SOLN
1.0000 g | INTRAVENOUS | Status: DC
  Administered 2021-07-21: 1 g via INTRAVENOUS
  Filled 2021-07-20 (×2): qty 10

## 2021-07-20 MED ORDER — HYDROXYZINE HCL 25 MG PO TABS
25.0000 mg | ORAL_TABLET | Freq: Three times a day (TID) | ORAL | Status: DC | PRN
  Administered 2021-07-24: 25 mg via ORAL
  Filled 2021-07-20: qty 1

## 2021-07-20 MED ORDER — DEXTROSE 50 % IV SOLN
INTRAVENOUS | Status: DC | PRN
  Administered 2021-07-20: 12.5 g via INTRAVENOUS
  Administered 2021-07-20: 25 g via INTRAVENOUS

## 2021-07-20 MED ORDER — SODIUM CHLORIDE 0.9 % IV SOLN: INTRAVENOUS | Status: DC

## 2021-07-20 MED ORDER — DEXAMETHASONE SODIUM PHOSPHATE 10 MG/ML IJ SOLN
INTRAMUSCULAR | Status: AC
  Filled 2021-07-20: qty 1

## 2021-07-20 MED ORDER — SODIUM CHLORIDE 0.9% FLUSH
3.0000 mL | Freq: Two times a day (BID) | INTRAVENOUS | Status: DC
  Administered 2021-07-20 – 2021-07-21 (×3): 3 mL via INTRAVENOUS

## 2021-07-20 MED ORDER — MUPIROCIN 2 % EX OINT
1.0000 | TOPICAL_OINTMENT | Freq: Two times a day (BID) | CUTANEOUS | Status: AC
  Administered 2021-07-20 – 2021-07-25 (×10): 1 via NASAL
  Filled 2021-07-20: qty 22

## 2021-07-20 MED ORDER — SUCCINYLCHOLINE CHLORIDE 200 MG/10ML IV SOSY
PREFILLED_SYRINGE | INTRAVENOUS | Status: DC | PRN
  Administered 2021-07-20: 180 mg via INTRAVENOUS

## 2021-07-20 MED ORDER — ONDANSETRON HCL 4 MG/2ML IJ SOLN: 4.0000 mg | Freq: Once | INTRAMUSCULAR | Status: DC | PRN

## 2021-07-20 MED ORDER — CAMPHOR-MENTHOL 0.5-0.5 % EX LOTN: 1.0000 | TOPICAL_LOTION | Freq: Three times a day (TID) | CUTANEOUS | Status: DC | PRN

## 2021-07-20 MED ORDER — CHLORHEXIDINE GLUCONATE 0.12 % MT SOLN
OROMUCOSAL | Status: AC
  Administered 2021-07-20: 15 mL via OROMUCOSAL
  Filled 2021-07-20: qty 15

## 2021-07-20 MED ORDER — PROPOFOL 10 MG/ML IV BOLUS
INTRAVENOUS | Status: AC
  Filled 2021-07-20: qty 20

## 2021-07-20 MED ORDER — FENTANYL CITRATE (PF) 100 MCG/2ML IJ SOLN
50.0000 ug | INTRAMUSCULAR | Status: DC | PRN
  Administered 2021-07-20: 50 ug via INTRAVENOUS
  Filled 2021-07-20: qty 2

## 2021-07-20 MED ORDER — PHENYLEPHRINE HCL-NACL 20-0.9 MG/250ML-% IV SOLN
INTRAVENOUS | Status: DC | PRN
  Administered 2021-07-20: 50 ug/min via INTRAVENOUS

## 2021-07-20 MED ORDER — SUCCINYLCHOLINE CHLORIDE 200 MG/10ML IV SOSY
PREFILLED_SYRINGE | INTRAVENOUS | Status: AC
  Filled 2021-07-20: qty 10

## 2021-07-20 MED ORDER — HEPARIN SODIUM (PORCINE) 5000 UNIT/ML IJ SOLN
5000.0000 [IU] | Freq: Three times a day (TID) | INTRAMUSCULAR | Status: DC
  Administered 2021-07-20 – 2021-07-22 (×5): 5000 [IU] via SUBCUTANEOUS
  Filled 2021-07-20 (×6): qty 1

## 2021-07-20 MED ORDER — ORAL CARE MOUTH RINSE: 15.0000 mL | Freq: Once | OROMUCOSAL | Status: AC

## 2021-07-20 MED ORDER — PHENYLEPHRINE HCL-NACL 20-0.9 MG/250ML-% IV SOLN
0.0000 ug/min | INTRAVENOUS | Status: DC
  Administered 2021-07-20: 100 ug/min via INTRAVENOUS
  Administered 2021-07-21: 40 ug/min via INTRAVENOUS
  Administered 2021-07-21: 80 ug/min via INTRAVENOUS
  Administered 2021-07-21: 60 ug/min via INTRAVENOUS
  Administered 2021-07-21: 80 ug/min via INTRAVENOUS
  Administered 2021-07-21: 100 ug/min via INTRAVENOUS
  Administered 2021-07-22: 40 ug/min via INTRAVENOUS
  Filled 2021-07-20 (×7): qty 250

## 2021-07-20 MED ORDER — VANCOMYCIN HCL IN DEXTROSE 1-5 GM/200ML-% IV SOLN
1000.0000 mg | INTRAVENOUS | Status: DC
  Filled 2021-07-20: qty 200

## 2021-07-20 MED ORDER — ZOLPIDEM TARTRATE 5 MG PO TABS: 5.0000 mg | ORAL_TABLET | Freq: Every evening | ORAL | Status: DC | PRN

## 2021-07-20 MED ORDER — NALOXONE HCL 0.4 MG/ML IJ SOLN
0.4000 mg | Freq: Once | INTRAMUSCULAR | Status: AC
  Administered 2021-07-20: 0.4 mg via INTRAVENOUS
  Filled 2021-07-20: qty 1

## 2021-07-20 MED ORDER — PHENYLEPHRINE 80 MCG/ML (10ML) SYRINGE FOR IV PUSH (FOR BLOOD PRESSURE SUPPORT)
PREFILLED_SYRINGE | INTRAVENOUS | Status: DC | PRN
  Administered 2021-07-20: 160 ug via INTRAVENOUS

## 2021-07-20 MED ORDER — VANCOMYCIN HCL 10 G IV SOLR
2500.0000 mg | Freq: Once | INTRAVENOUS | Status: AC
  Administered 2021-07-20: 2500 mg via INTRAVENOUS
  Filled 2021-07-20: qty 2500

## 2021-07-20 MED ORDER — SUCCINYLCHOLINE CHLORIDE 200 MG/10ML IV SOSY
PREFILLED_SYRINGE | INTRAVENOUS | Status: AC
Start: 2021-07-20 — End: ?
  Filled 2021-07-20: qty 30

## 2021-07-20 MED ORDER — FENTANYL CITRATE (PF) 100 MCG/2ML IJ SOLN
50.0000 ug | INTRAMUSCULAR | Status: DC | PRN
  Administered 2021-07-21 (×2): 100 ug via INTRAVENOUS
  Filled 2021-07-20 (×3): qty 2

## 2021-07-20 MED ORDER — PANTOPRAZOLE 2 MG/ML SUSPENSION: 40.0000 mg | Freq: Every day | ORAL | Status: DC

## 2021-07-20 MED ORDER — HEPARIN SODIUM (PORCINE) 1000 UNIT/ML DIALYSIS
10000.0000 [IU] | Freq: Once | INTRAMUSCULAR | Status: DC
  Filled 2021-07-20: qty 10

## 2021-07-20 MED ORDER — CHLORHEXIDINE GLUCONATE CLOTH 2 % EX PADS
6.0000 | MEDICATED_PAD | Freq: Every day | CUTANEOUS | Status: DC
  Administered 2021-07-22 – 2021-07-24 (×2): 6 via TOPICAL

## 2021-07-20 MED ORDER — LIDOCAINE 2% (20 MG/ML) 5 ML SYRINGE
INTRAMUSCULAR | Status: DC | PRN
  Administered 2021-07-20: 80 mg via INTRAVENOUS

## 2021-07-20 MED ORDER — METRONIDAZOLE 500 MG/100ML IV SOLN
500.0000 mg | Freq: Once | INTRAVENOUS | Status: AC
  Administered 2021-07-20: 500 mg via INTRAVENOUS
  Filled 2021-07-20: qty 100

## 2021-07-20 MED ORDER — PHENYLEPHRINE 80 MCG/ML (10ML) SYRINGE FOR IV PUSH (FOR BLOOD PRESSURE SUPPORT)
PREFILLED_SYRINGE | INTRAVENOUS | Status: AC
  Filled 2021-07-20: qty 20

## 2021-07-20 MED ORDER — EPHEDRINE 5 MG/ML INJ
INTRAVENOUS | Status: AC
  Filled 2021-07-20: qty 5

## 2021-07-20 MED ORDER — FENTANYL CITRATE (PF) 250 MCG/5ML IJ SOLN
INTRAMUSCULAR | Status: AC
  Filled 2021-07-20: qty 5

## 2021-07-20 MED ORDER — SORBITOL 70 % SOLN: 30.0000 mL | Status: DC | PRN

## 2021-07-20 MED ORDER — LIDOCAINE-PRILOCAINE 2.5-2.5 % EX CREA: 1.0000 | TOPICAL_CREAM | CUTANEOUS | Status: DC | PRN

## 2021-07-20 MED ORDER — PROPOFOL 500 MG/50ML IV EMUL
INTRAVENOUS | Status: DC | PRN
  Administered 2021-07-20: 50 ug/kg/min via INTRAVENOUS

## 2021-07-20 MED ORDER — SODIUM CHLORIDE 0.9 % IV SOLN
2.0000 g | Freq: Once | INTRAVENOUS | Status: AC
  Administered 2021-07-20: 2 g via INTRAVENOUS
  Filled 2021-07-20: qty 12.5

## 2021-07-20 MED ORDER — DEXTROSE 50 % IV SOLN: 25.0000 g | INTRAVENOUS | Status: AC

## 2021-07-20 MED ORDER — VASOPRESSIN 20 UNIT/ML IV SOLN
INTRAVENOUS | Status: DC | PRN
  Administered 2021-07-20 (×2): 2 [IU] via INTRAVENOUS

## 2021-07-20 MED ORDER — SODIUM CHLORIDE 0.9% FLUSH
10.0000 mL | Freq: Two times a day (BID) | INTRAVENOUS | Status: DC
  Administered 2021-07-20 – 2021-07-25 (×8): 10 mL

## 2021-07-20 MED ORDER — DOXERCALCIFEROL 4 MCG/2ML IV SOLN
4.0000 ug | INTRAVENOUS | Status: DC
  Administered 2021-07-22 – 2021-07-24 (×2): 4 ug via INTRAVENOUS
  Filled 2021-07-20 (×2): qty 2

## 2021-07-20 MED ORDER — ACETAMINOPHEN 325 MG PO TABS
650.0000 mg | ORAL_TABLET | Freq: Four times a day (QID) | ORAL | Status: DC | PRN
  Administered 2021-07-24: 650 mg via ORAL
  Filled 2021-07-20: qty 2

## 2021-07-20 MED ORDER — LIDOCAINE HCL (PF) 1 % IJ SOLN: 5.0000 mL | INTRAMUSCULAR | Status: DC | PRN

## 2021-07-20 MED ORDER — FENTANYL CITRATE (PF) 250 MCG/5ML IJ SOLN
INTRAMUSCULAR | Status: DC | PRN
  Administered 2021-07-20: 100 ug via INTRAVENOUS

## 2021-07-20 MED ORDER — 0.9 % SODIUM CHLORIDE (POUR BTL) OPTIME
TOPICAL | Status: DC | PRN
  Administered 2021-07-20: 1000 mL

## 2021-07-20 SURGICAL SUPPLY — 54 items
BAG COUNTER SPONGE SURGICOUNT (BAG) ×3 IMPLANT
BAG SPNG CNTER NS LX DISP (BAG) ×1
BANDAGE ESMARK 6X9 LF (GAUZE/BANDAGES/DRESSINGS) IMPLANT
BLADE SAW SGTL 73X25 THK (BLADE) ×3 IMPLANT
BNDG CMPR 9X6 STRL LF SNTH (GAUZE/BANDAGES/DRESSINGS)
BNDG CMPR MED 10X6 ELC LF (GAUZE/BANDAGES/DRESSINGS) ×1
BNDG COHESIVE 6X5 TAN STRL LF (GAUZE/BANDAGES/DRESSINGS) ×3 IMPLANT
BNDG ELASTIC 4X5.8 VLCR STR LF (GAUZE/BANDAGES/DRESSINGS) ×3 IMPLANT
BNDG ELASTIC 6X10 VLCR STRL LF (GAUZE/BANDAGES/DRESSINGS) ×1 IMPLANT
BNDG ELASTIC 6X5.8 VLCR STR LF (GAUZE/BANDAGES/DRESSINGS) ×3 IMPLANT
BNDG ESMARK 6X9 LF (GAUZE/BANDAGES/DRESSINGS)
BNDG GAUZE DERMACEA FLUFF (GAUZE/BANDAGES/DRESSINGS) ×2
BNDG GAUZE DERMACEA FLUFF 4 (GAUZE/BANDAGES/DRESSINGS) IMPLANT
BNDG GAUZE ELAST 4 BULKY (GAUZE/BANDAGES/DRESSINGS) ×6 IMPLANT
BNDG GZE DERMACEA 4 6PLY (GAUZE/BANDAGES/DRESSINGS) ×2
CANISTER SUCT 3000ML PPV (MISCELLANEOUS) ×3 IMPLANT
CLIP VESOCCLUDE MED 6/CT (CLIP) IMPLANT
COVER BACK TABLE 60X90IN (DRAPES) IMPLANT
COVER SURGICAL LIGHT HANDLE (MISCELLANEOUS) ×3 IMPLANT
DRAIN CHANNEL 19F RND (DRAIN) IMPLANT
DRAPE HALF SHEET 40X57 (DRAPES) ×3 IMPLANT
DRAPE ORTHO SPLIT 77X108 STRL (DRAPES) ×4
DRAPE SURG ORHT 6 SPLT 77X108 (DRAPES) ×4 IMPLANT
DRSG ADAPTIC 3X8 NADH LF (GAUZE/BANDAGES/DRESSINGS) ×3 IMPLANT
ELECT REM PT RETURN 9FT ADLT (ELECTROSURGICAL) ×2
ELECTRODE REM PT RTRN 9FT ADLT (ELECTROSURGICAL) ×2 IMPLANT
EVACUATOR SILICONE 100CC (DRAIN) IMPLANT
GAUZE SPONGE 4X4 12PLY STRL (GAUZE/BANDAGES/DRESSINGS) ×6 IMPLANT
GAUZE SPONGE 4X4 12PLY STRL LF (GAUZE/BANDAGES/DRESSINGS) ×1 IMPLANT
GLOVE BIO SURGEON STRL SZ7.5 (GLOVE) ×3 IMPLANT
GLOVE BIOGEL PI IND STRL 8 (GLOVE) ×2 IMPLANT
GLOVE BIOGEL PI INDICATOR 8 (GLOVE) ×1
GOWN STRL REUS W/ TWL LRG LVL3 (GOWN DISPOSABLE) ×4 IMPLANT
GOWN STRL REUS W/ TWL XL LVL3 (GOWN DISPOSABLE) ×4 IMPLANT
GOWN STRL REUS W/TWL LRG LVL3 (GOWN DISPOSABLE) ×4
GOWN STRL REUS W/TWL XL LVL3 (GOWN DISPOSABLE) ×4
KIT BASIN OR (CUSTOM PROCEDURE TRAY) ×3 IMPLANT
KIT TURNOVER KIT B (KITS) ×3 IMPLANT
NS IRRIG 1000ML POUR BTL (IV SOLUTION) ×3 IMPLANT
PACK GENERAL/GYN (CUSTOM PROCEDURE TRAY) ×3 IMPLANT
PAD ARMBOARD 7.5X6 YLW CONV (MISCELLANEOUS) ×6 IMPLANT
STAPLER VISISTAT 35W (STAPLE) ×3 IMPLANT
STOCKINETTE IMPERVIOUS LG (DRAPES) ×3 IMPLANT
SUT ETHILON 3 0 PS 1 (SUTURE) IMPLANT
SUT SILK 2 0 (SUTURE) ×2
SUT SILK 2 0 SH CR/8 (SUTURE) ×3 IMPLANT
SUT SILK 2-0 18XBRD TIE 12 (SUTURE) ×2 IMPLANT
SUT SILK 3 0 (SUTURE) ×2
SUT SILK 3-0 18XBRD TIE 12 (SUTURE) ×2 IMPLANT
SUT VIC AB 2-0 CT1 18 (SUTURE) ×6 IMPLANT
SUT VIC AB 3-0 SH 18 (SUTURE) IMPLANT
TOWEL GREEN STERILE (TOWEL DISPOSABLE) ×6 IMPLANT
UNDERPAD 30X36 HEAVY ABSORB (UNDERPADS AND DIAPERS) ×3 IMPLANT
WATER STERILE IRR 1000ML POUR (IV SOLUTION) ×3 IMPLANT

## 2021-07-20 NOTE — Anesthesia Procedure Notes (Signed)
Arterial Line Insertion Start/End06/16/2023 6:05 PM, 07/11/2021 6:14 PM  Patient location: OR. Preanesthetic checklist: IV checked Left, radial was placed Catheter size: 20 G Hand hygiene performed   Attempts: 1 Procedure performed without using ultrasound guided technique. Following insertion, dressing applied and Biopatch. Post procedure assessment: normal  Patient tolerated the procedure well with no immediate complications.

## 2021-07-20 NOTE — Progress Notes (Signed)
Home meds listed include plavix and eliquis. Pt states she does not take a blood thinner. Dr. Carlis Abbott aware. Pharmacy called to complete med rec.

## 2021-07-20 NOTE — ED Notes (Signed)
This paramedic attempted to get a pulse ox reading. I applied two more new pulse ox leads. One new lead on the left hand and one to her chest which I then switched out to her forehead. I am still unable to obtain a reading. I checked all cords and even switched those out again as well.

## 2021-07-20 NOTE — Consult Note (Addendum)
Hospital Consult    Reason for Consult:  check amputation site Requesting Physician:  Lorin Mercy MRN #:  [id number]  History of Present Illness: This is a 56 y.o. female who presented to the hospital with AMS.  VVS is asked to check her amputation site.   She states that her family was worried about her stump and brought her to the ER.  RN reports she has received narcan twice and has been somnolent.  She wakes easily and has a conversation with me.  She denies any fevers or chills.  She states she has been having pain in the left stump.  She states that she has bumped her stump several times since being home.    She has hx of left below knee amputation on 06/02/21 by Dr. Virl Cagey. This was performed due to left foot wound with unreconstructable vascular disease.   She was last seen in our office on 07/10/2021.  She had gone home from SNF the day prior.  She reported bumping her stump.  She was not having and fever or chills.  The stump did have some mild dehiscence in the middle of the incision and there was significant edema in the stump.  Dr. Virl Cagey recommended wrapping the stump daily and elevating it.    She has a history of finger loss due to non healing wounds with osteomyelitis.  She has ESRD and is on HD at the [place] location M/W/F.   Past Medical History:  Diagnosis Date   Anaphylactic shock, unspecified, initial encounter 09/04/2018   Anemia    Anxiety    CAD (coronary artery disease)    Nonobstructive on CT 2019   CHF (congestive heart failure) (HCC)    Chronic bilateral pleural effusions 10/23/2018   COVID-19 virus infection 09/18/2018   ESRD (end stage renal disease) ([place])    Dialysis [facility]- 3rd st   Gangrene ([place]) 09/18/2018   Headache(784.0)    High cholesterol    History of blood transfusion    Hypertension    Mitral regurgitation    moderate to severe MR 10/2018 echo   Neuropathy 01/29/2011   04/2011 MRI L-spine:  L5-S1: Bulge/shallow broad-based protrusion  greatest centrally and in the right posterior lateral position. Minimal crowding of the  upper aspect of the S1 nerve root greater on the right. Left lateral disc osteophyte with mild encroachment upon but not  significant compression of the exiting left L5 nerve root.   05/2011: Dr. Sherwood Gambler ([facility]): DJD and mild disc bu   Pericardial effusion    PVD (peripheral vascular disease) ([place])    Right leg stent in [place].  (No records)   Restless legs    Rheumatoid arthritis ([place])    "knees" "hands", "RA"   Sleep apnea    does not use Cpap   Thoracic ascending aortic aneurysm (HCC)    4.4 cm 11/14/18 CTA   Type II diabetes mellitus (HCC)    Uterine leiomyoma 01/10/2021    Past Surgical History:  Procedure Laterality Date   ABDOMINAL AORTOGRAM W/LOWER EXTREMITY N/A 05/27/2021   Procedure: ABDOMINAL AORTOGRAM W/LOWER EXTREMITY;  Surgeon: Broadus John, MD;  Location: [place];  Service: Cardiovascular;  Laterality: N/A;   AMPUTATION Left 12/27/2018   Procedure: LEFT THUMB REVISION AMPUTATION DIGIT;  Surgeon: Charlotte Crumb, MD;  Location: [place];  Service: Orthopedics;  Laterality: Left;   AMPUTATION Left 01/15/2021   Procedure: AMPUTATION OF DIGIT LEFT INDEX FINGER;  Surgeon: Leanora Cover, MD;  Location: [facility]  OR;  Service: Orthopedics;  Laterality: Left;   AMPUTATION Left 05/28/2021   Procedure: LEFT 5TH TOE RAY AMPUTATION;  Surgeon: Waynetta Sandy, MD;  Location: [place];  Service: Vascular;  Laterality: Left;   AMPUTATION Left 06/02/2021   Procedure: AMPUTATION BELOW KNEE;  Surgeon: Broadus John, MD;  Location: [facility] OR;  Service: Vascular;  Laterality: Left;   AV FISTULA PLACEMENT Left    AV FISTULA PLACEMENT Right 03/12/2019   Procedure: INSERTION OF ARTERIOVENOUS (AV) GORE-TEX GRAFT ARM;  Surgeon: Elam Dutch, MD;  Location: [place];  Service: Vascular;  Laterality: Right;   AV FISTULA PLACEMENT Right 08/13/2019   Procedure: RIGHT UPPER ARM  ARTERIOVENOUS (AV) FISTULA CREATION WITH BASILIC VEIN;  Surgeon: Elam Dutch, MD;  Location: [place];  Service: Vascular;  Laterality: Right;   [place]; 1997   x 2   COLONOSCOPY     EYE SURGERY Bilateral    cataract surgery   I & D EXTREMITY Left 01/09/2021   Procedure: IRRIGATION AND DEBRIDEMENT INDEX FINGER;  Surgeon: Leanora Cover, MD;  Location: [place];  Service: Orthopedics;  Laterality: Left;   INSERTION OF DIALYSIS CATHETER Right 09/26/2018   Procedure: INSERTION OF DIALYSIS CATHETER Right Internal Jugular.;  Surgeon: Elam Dutch, MD;  Location: [facility] OR;  Service: Vascular;  Laterality: Right;   IR DIALY SHUNT INTRO NEEDLE/INTRACATH INITIAL W/IMG RIGHT Right 11/21/2020   IR FLUORO GUIDE CV LINE RIGHT  05/04/2019   IR US GUIDE VASC ACCESS RIGHT  05/04/2019   IR US GUIDE VASC ACCESS RIGHT  11/21/2020   LIGATION OF ARTERIOVENOUS  FISTULA Left 09/26/2018   Procedure: LIGATION OF ARTERIOVENOUS  FISTULA LEFT ARM;  Surgeon: Elam Dutch, MD;  Location: [place];  Service: Vascular;  Laterality: Left;   PERICARDIAL WINDOW  02/2004   for pericardial effusion   PERIPHERAL VASCULAR ATHERECTOMY  05/27/2021   Procedure: PERIPHERAL VASCULAR ATHERECTOMY;  Surgeon: Broadus John, MD;  Location: [place] [facility];  Service: Cardiovascular;;  Left AT   PERIPHERAL VASCULAR INTERVENTION Right 10/26/2019   Procedure: PERIPHERAL VASCULAR INTERVENTION;  Surgeon: Elam Dutch, MD;  Location: [place] [facility];  Service: Cardiovascular;  Laterality: Right;  arm  AV fistula   TUBAL LIGATION  05/1995   UPPER EXTREMITY VENOGRAPHY N/A 06/22/2019   Procedure: UPPER EXTREMITY VENOGRAPHY - Right Central;  Surgeon: Elam Dutch, MD;  Location: [place] [facility];  Service: Cardiovascular;  Laterality: N/A;    Allergies  Allergen Reactions   Bupropion Itching   Dilaudid [Hydromorphone] Other (See Comments)    Apnea, required intubation    Prior to Admission medications   Medication  Sig Start Date End Date Taking? Authorizing Provider  hydrOXYzine (ATARAX/VISTARIL) 25 MG tablet Take 25 mg by mouth every morning.   Yes [provider]  acetaminophen (TYLENOL) 500 MG tablet Take 1,000 mg by mouth daily as needed for headache (pain).    [provider]  albuterol (VENTOLIN HFA) 108 (90 Base) MCG/ACT inhaler Inhale 2 puffs into the lungs every 6 (six) hours as needed for wheezing or shortness of breath. 02/22/20   Thurnell Lose, MD  apixaban (ELIQUIS) 5 MG TABS tablet Take 1 tablet (5 mg total) by mouth 2 (two) times daily. 06/11/21   Gherghe, Vella Redhead, MD  AURYXIA 1 GM 210 MG(Fe) tablet Take 420-840 mg by mouth See admin instructions. 840 mg twice daily with meals,  420 mg with a snacks 05/29/18  [provider]  b complex-vitamin c-folic acid (NEPHRO-VITE) 0.8 MG TABS tablet Take 1 tablet by mouth every Monday, Wednesday, and Friday with hemodialysis. 04/12/19   [provider]  bisacodyl (DULCOLAX) 10 MG suppository Place 10 mg rectally as needed for moderate constipation.    [provider]  clopidogrel (PLAVIX) 75 MG tablet Take 1 tablet (75 mg total) by mouth daily with breakfast. 06/11/21   Caren Griffins, MD  doxycycline (VIBRA-TABS) 100 MG tablet Take 1 tablet (100 mg total) by mouth 2 (two) times daily. 06/18/21   Elgergawy, Silver Huguenin, MD  HUMIRA PEN 40 MG/0.4ML PNKT Inject 40 mg into the skin every 14 (fourteen) days. 08/22/20   [provider]  insulin aspart (NOVOLOG FLEXPEN) 100 UNIT/ML FlexPen Inject 5 Units into the skin 3 (three) times daily with meals. And at bedtime for CBG greater than 200    [provider]  Methoxy PEG-Epoetin Beta (MIRCERA IJ) Dialysis Monday,Wednesday and friday 12/19/20 12/18/21  [provider]  metoprolol tartrate (LOPRESSOR) 25 MG tablet Take 0.5 tablets (12.5 mg total) by mouth 2 (two) times daily. 06/11/21   Caren Griffins, MD  midodrine (PROAMATINE) 5 MG tablet  Take 5 mg by mouth 3 (three) times daily with meals. For hypotension    [provider]  NARCAN 4 MG/0.1ML LIQD nasal spray kit Place 1 spray into the nose as needed (accidental overdose.). 10/10/19   [provider]  NON FORMULARY BIPAP at bedtime    [provider]  omega-3 fish oil (MAXEPA) 1000 MG CAPS capsule Take 1,000 mg by mouth daily.    [provider]  OXYGEN Inhale 2 L into the lungs continuous.    [provider]  pantoprazole (PROTONIX) 40 MG tablet Take 1 tablet (40 mg total) by mouth at bedtime. 06/18/21   Elgergawy, Silver Huguenin, MD  Pollen Extracts (PROSTAT PO) Take 30 mLs by mouth in the morning and at bedtime.    [provider]  pramipexole (MIRAPEX) 0.5 MG tablet Take 0.5 mg by mouth daily.  05/18/17   [provider]  rosuvastatin (CRESTOR) 40 MG tablet TAKE 1 TABLET BY MOUTH DAILY *PATIENT NEEDS APPOINTMENT* 12/08/20   Minus Breeding, MD  Sodium Phosphates (RA SALINE ENEMA RE) Place 1 Dose rectally as needed.    [provider]  zinc sulfate 50 MG CAPS capsule Take 220 mg by mouth daily.    [provider]    Social History   Socioeconomic History   Marital status: Married    Spouse name: Not on file   Number of children: Not on file   Years of education: Not on file   Highest education level: Not on file  Occupational History   Not on file  Tobacco Use   Smoking status: Some Days    Packs/day: 0.25    Years: 33.00    Total pack years: 8.25    Types: Cigarettes    Last attempt to quit: 07/17/2019    Years since quitting: 2.0   Smokeless tobacco: Never   Tobacco comments:    She started back 2021 and started back 6 month later.  Vaping Use   Vaping Use: Never used  Substance and Sexual Activity   Alcohol use: No   Drug use: No   Sexual activity: Not Currently    Birth control/protection: Post-menopausal  Other Topics Concern   Not on file  Social History Narrative   Lives with  son.  Social Determinants of Health   Financial Resource Strain: Not on file  Food Insecurity: Not on file  Transportation Needs: Not on file  Physical Activity: Not on file  Stress: Not on file  Social Connections: Not on file  Intimate Partner Violence: Not on file   Family History  Problem Relation Age of Onset   Cancer Brother    Heart disease Father        Died age 48   Diabetes Father    Hyperlipidemia Father    Hypertension Father    Stroke Father    Diabetes Mother    Hypertension Mother    Stroke Mother    Dementia Mother    Diabetes Sister    Diabetes Brother    Hypertension Sister    Hypertension Brother     ROS: See HPI  Physical Examination  Vitals:   07/27/2021 1030 07/24/2021 1045  BP: 115/71 (!) 106/57  Resp: 18 (!) 24  Temp:     There is no height or weight on file to calculate BMI.  General:  WDWN in NAD Gait: Not observed HENT: WNL, normocephalic Pulmonary: normal non-labored breathing Extremities:  Left BKA stump with purulent drainage and malodorous.  Culture tube probed ~ 5cm into wound.    Right leg as pictured below:  brisk right AT doppler signal.      CBC    Component Value Date/Time   WBC 8.1 07/08/2021 0438   RBC 3.17 (L) 07/02/2021 0438   HGB 10.2 (L) 07/05/2021 0956   HGB 9.8 (L) 03/27/2021 1113   HGB 8.1 (L) 09/18/2018 1227   HCT 30.0 (L) 07/22/2021 0956   HCT 39.5 06/01/2017 0959   PLT 222 07/04/2021 0438   PLT 228 03/27/2021 1113   PLT 238 06/01/2017 0959   MCV 100.0 07/26/2021 0438   MCV 90 06/01/2017 0959   MCH 29.7 07/13/2021 0438   MCHC 29.7 (L) 07/19/2021 0438   RDW 17.2 (H) 07/28/2021 0438   RDW 18.4 (H) 06/01/2017 0959   LYMPHSABS 1.0 07/10/2021 0438   MONOABS 0.5 07/26/2021 0438   EOSABS 0.4 07/28/2021 0438   BASOSABS 0.0 07/14/2021 0438    BMET    Component Value Date/Time   NA 139 07/19/2021 0956   NA 135 06/01/2017 0959   K 3.6 07/05/2021 0956   CL 97 (L) 07/03/2021 0438   CO2 28  07/07/2021 0438   GLUCOSE 84 07/29/2021 0438   BUN 44 (H) 07/29/2021 0438   BUN 27 (H) 06/01/2017 0959   CREATININE 6.78 (H) 07/07/2021 0438   CREATININE 2.96 (H) 03/27/2021 1113   CREATININE 1.96 (H) 07/19/2011 1201   CALCIUM 9.5 07/05/2021 0438   GFRNONAA 7 (L) 07/19/2021 0438   GFRNONAA 18 (L) 03/27/2021 1113   GFRAA 7 (L) 07/16/2019 1152    COAGS: Lab Results  Component Value Date   INR 1.7 (H) 06/15/2021   INR 1.0 11/14/2018   INR 0.90 01/30/2011      ASSESSMENT/PLAN: This is a 56 y.o. female with hx of left below knee amputation on 06/02/21 by Dr. Virl Cagey. This was performed due to left foot wound with unreconstructable vascular disease.  VVS is asked to evaluate stump incision.    -pt was last seen in the office on 07/10/2021 and at that time, there was a superficial dehiscence with significant edema in the stump.  It was recommended to wrap with gentle compression and f/u in a couple of weeks.  Pt presented to ED today with  concerns about her stump and while in ER, had AMS.    -pt now awake and able to have conversation with me.  She states she has bumped her leg several times on the wheelchair and her bed.  She has not had fever or chills.  She states she has not eaten or had anything to drink today.    -she has copious purulent drainage from left BKA stump, which has significantly deteriorated since being seen in our office 10 days ago.   Her K+ is 3.6 today.  She was going to dialysis this afternoon, but I have asked them to hold on this so that she can go to the OR this afternoon for most likely above knee amputation as the culture tube probed ~ 5cm.   -wound culture obtained and will need broad spectrum abx -Dr. Carlis Abbott to see pt and determine further recommendations.   Leontine Locket, PA-C [facility] [phone]   I have seen and evaluated the patient. I agree with the PA note as documented above.  Patient most recently underwent left below-knee  amputation on 06/02/2021 with Dr. Virl Cagey.  She has known tibial disease.  BKA now infected with purulent drainage.  She wants to make every effort to save the BKA even though I told her very high risk that she will ultimately need an above-knee amputation.  We will take her to the OR for washout cultures and debridement.  I am unclear whether she is taking her Eliquis given its in her chart and she has no knowledge about her home medications.  Would hold Eliquis this admission.  Ultimately if this does not heal will need above-knee amputation.  Marty Heck, MD [facility] [place] Office: [phone]

## 2021-07-20 NOTE — H&P (Signed)
History and Physical    Patient: [name] QVZ:[id number] DOB: 24-Feb-1965 DOA: 07/30/2021 DOS: the patient was seen and examined on 07/24/2021 PCP: Sandi Mariscal, MD  Patient coming from: Home - lives with husband and 2 sons; NOK: Pandora Leiter, [phone]   Chief Complaint: AMS  HPI: [name] is a 56 y.o. 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  She was last admitted from 5/15-18 for AMS from possible opioid overdose as well as 59m SDH.  She was discharged to SNF but returned home on 6/8.  She was seen by vascular on 6/9 and was noted to have stump edema and so staples were left in place.  [facility]  Her sister was there yesterday, was sleepy and different from prior days.  She was alert, arousable, oriented.  She could have a conversation about 6pm.  At 3am, she requested to go to the hospital due to sharp abdominal pain.  She was not overly sleepy overnight but has been altered since being in the ER.  No pain medications at home at all.  She usually gets MWF HD.  No other GI symptoms.  Has complained of LLE pain, does not appear to take anything.    ER Course:  AMS likely due to polypharmacy.  Did not respond to Narcan, ABG done because of O2 sats not reading.  Needs SDU admission.     Review of Systems: unable to review all systems due to the inability of the patient to answer questions. Past Medical History:  Diagnosis Date   Anaphylactic shock, unspecified, initial encounter 09/04/2018   Anemia    Anxiety    CAD (coronary artery disease)    Nonobstructive on CT 2019   CHF (congestive heart failure) (HCC)    Chronic bilateral pleural effusions 10/23/2018   COVID-19 virus infection 09/18/2018   ESRD ([place]  disease) [place]    Dialysis [facility]   [place] 09/18[place]   Headache(784.0)    High cholesterol    History of blood transfusion    Hypertension    Mitral regurgitation    moderate to severe MR 10/2018 echo   Neuropathy 01/29/2011   04/2011 MRI L-spine:  L5-S1: Bulge/shallow broad-based protrusion greatest centrally and in the right posterior lateral position. Minimal crowding of the  upper aspect of the S1 nerve root greater on the right. Left lateral disc osteophyte with mild encroachment upon but not  significant compression of the exiting left L5 nerve root.   05/2011: Dr. NSherwood Gambler([facility]): DJD and mild disc bu   Pericardial effusion    PVD [place]    [place]   Restless legs    [place] [place]    "knees" "hands", "RA"   Sleep apnea    does not use Cpap   Thoracic ascending aortic aneurysm (HCC)    4.4 cm 11/14/18 CTA   Type II diabetes mellitus (HCC)    Uterine leiomyoma 01/10/2021   Past Surgical History:  Procedure Laterality Date   ABDOMINAL AORTOGRAM W/LOWER EXTREMITY N/A 05/27/2021   Procedure: ABDOMINAL AORTOGRAM W/LOWER EXTREMITY;  Surgeon: RBroadus John MD;  [place] [place];  Service:  Cardiovascular;  Laterality: N/A;   AMPUTATION Left 12/27/2018   Procedure: LEFT THUMB REVISION AMPUTATION DIGIT;  Surgeon: Charlotte Crumb, MD;  Location: [place];  Service: Orthopedics;  Laterality: Left;   AMPUTATION Left 01/15/2021   Procedure: AMPUTATION OF DIGIT LEFT INDEX FINGER;  Surgeon: Leanora Cover, MD;  Location: [place];  Service: Orthopedics;  Laterality: Left;   AMPUTATION Left 05/28/2021   Procedure: LEFT 5TH TOE RAY AMPUTATION;  Surgeon: Waynetta Sandy, MD;  Location: [place];  Service: Vascular;  Laterality: Left;   AMPUTATION Left 06/02/2021   Procedure: AMPUTATION BELOW KNEE;  Surgeon: Broadus John, MD;  Location: [facility];  Service: Vascular;   Laterality: Left;   AV FISTULA PLACEMENT Left    AV FISTULA PLACEMENT Right 03/12/2019   Procedure: INSERTION OF ARTERIOVENOUS (AV) GORE-TEX GRAFT ARM;  Surgeon: Elam Dutch, MD;  Location: [place];  Service: Vascular;  Laterality: Right;   AV FISTULA PLACEMENT Right 08/13/2019   Procedure: RIGHT UPPER ARM ARTERIOVENOUS (AV) FISTULA CREATION WITH BASILIC VEIN;  Surgeon: Elam Dutch, MD;  Location: [place];  Service: Vascular;  Laterality: Right;   [place]; 1997   x 2   COLONOSCOPY     EYE SURGERY Bilateral    cataract surgery   I & D EXTREMITY Left 01/09/2021   Procedure: IRRIGATION AND DEBRIDEMENT INDEX FINGER;  Surgeon: Leanora Cover, MD;  Location: [place];  Service: Orthopedics;  Laterality: Left;   INSERTION OF DIALYSIS CATHETER Right 09/26/2018   Procedure: INSERTION OF DIALYSIS CATHETER Right Internal Jugular.;  Surgeon: Elam Dutch, MD;  Location[facility] OR;  Service: Vascular;  Laterality: Right;   IR DIALY SHUNT INTRO NEEDLE/INTRACATH INITIAL W/IMG RIGHT Right 11/21/2020   IR FLUORO GUIDE CV LINE RIGHT  05/04/2019   IR US GUIDE VASC ACCESS RIGHT  05/04/2019   IR US GUIDE VASC ACCESS RIGHT  11/21/2020   LIGATION OF ARTERIOVENOUS  FISTULA Left 09/26/2018   Procedure: LIGATION OF ARTERIOVENOUS  FISTULA LEFT ARM;  Surgeon: Elam Dutch, MD;  Location: [place];  Service: Vascular;  Laterality: Left;   PERICARDIAL WINDOW  02/2004   for pericardial effusion   PERIPHERAL VASCULAR ATHERECTOMY  05/27/2021   Procedure: PERIPHERAL VASCULAR ATHERECTOMY;  Surgeon: Broadus John, MD;  Location: [place] [facility] LAB;  Service: Cardiovascular;;  Left AT   PERIPHERAL VASCULAR INTERVENTION Right 10/26/2019   Procedure: PERIPHERAL VASCULAR INTERVENTION;  Surgeon: Elam Dutch, MD;  Location: [place] [facility] LAB;  Service: Cardiovascular;  Laterality: Right;  arm  AV fistula   TUBAL LIGATION  05/1995   UPPER EXTREMITY VENOGRAPHY N/A 06/22/2019   Procedure: UPPER EXTREMITY VENOGRAPHY  - Right [place];  Surgeon: Elam Dutch, MD;  Location: [place] [facility];  Service: Cardiovascular;  Laterality: N/A;   Social History:  reports that she has been smoking cigarettes. She has a 8.25 pack-year smoking history. She has never used smokeless tobacco. She reports that she does not drink alcohol and does not use drugs.  Allergies  Allergen Reactions   Bupropion Itching   Dilaudid [Hydromorphone] Other (See Comments)    Apnea, required intubation    Family History  Problem Relation Age of Onset   Cancer Brother    Heart disease Father        Died age 29   Diabetes Father    Hyperlipidemia Father    Hypertension Father    Stroke Father    Diabetes Mother    Hypertension  Mother    Stroke Mother    Dementia Mother    Diabetes Sister    Diabetes Brother    Hypertension Sister    Hypertension Brother     Prior to Admission medications   Medication Sig Start Date End Date Taking? Authorizing Provider  acetaminophen (TYLENOL) 500 MG tablet Take 1,000 mg by mouth daily as needed for headache (pain).    [provider]  albuterol (VENTOLIN HFA) 108 (90 Base) MCG/ACT inhaler Inhale 2 puffs into the lungs every 6 (six) hours as needed for wheezing or shortness of breath. 02/22/20   Thurnell Lose, MD  apixaban (ELIQUIS) 5 MG TABS tablet Take 1 tablet (5 mg total) by mouth 2 (two) times daily. 06/11/21   Gherghe, Vella Redhead, MD  AURYXIA 1 GM 210 MG(Fe) tablet Take 420-840 mg by mouth See admin instructions. 840 mg twice daily with meals,  420 mg with a snacks 05/29/18   [provider]  b complex-vitamin c-folic acid (NEPHRO-VITE) 0.8 MG TABS tablet Take 1 tablet by mouth every Monday, Wednesday, and Friday with hemodialysis. 04/12/19   [provider]  bisacodyl (DULCOLAX) 10 MG suppository Place 10 mg rectally as needed for moderate constipation.    [provider]  clopidogrel (PLAVIX) 75 MG tablet Take 1 tablet (75 mg total) by mouth  daily with breakfast. 06/11/21   Caren Griffins, MD  doxycycline (VIBRA-TABS) 100 MG tablet Take 1 tablet (100 mg total) by mouth 2 (two) times daily. 06/18/21   Elgergawy, Silver Huguenin, MD  HUMIRA PEN 40 MG/0.4ML PNKT Inject 40 mg into the skin every 14 (fourteen) days. 08/22/20   [provider]  hydrOXYzine (ATARAX/VISTARIL) 25 MG tablet Take 25 mg by mouth every morning.    [provider]  insulin aspart (NOVOLOG FLEXPEN) 100 UNIT/ML FlexPen Inject 5 Units into the skin 3 (three) times daily with meals. And at bedtime for CBG greater than 200    [provider]  Methoxy PEG-Epoetin Beta (MIRCERA IJ) Dialysis Monday,Wednesday and friday 12/19/20 12/18/21  [provider]  metoprolol tartrate (LOPRESSOR) 25 MG tablet Take 0.5 tablets (12.5 mg total) by mouth 2 (two) times daily. 06/11/21   Caren Griffins, MD  midodrine (PROAMATINE) 5 MG tablet Take 5 mg by mouth 3 (three) times daily with meals. For hypotension    [provider]  NARCAN 4 MG/0.1ML LIQD nasal spray kit Place 1 spray into the nose as needed (accidental overdose.). 10/10/19   [provider]  NON FORMULARY BIPAP at bedtime    [provider]  omega-3 fish oil (MAXEPA) 1000 MG CAPS capsule Take 1,000 mg by mouth daily.    [provider]  OXYGEN Inhale 2 L into the lungs continuous.    [provider]  pantoprazole (PROTONIX) 40 MG tablet Take 1 tablet (40 mg total) by mouth at bedtime. 06/18/21   Elgergawy, Silver Huguenin, MD  Pollen Extracts (PROSTAT PO) Take 30 mLs by mouth in the morning and at bedtime.    [provider]  pramipexole (MIRAPEX) 0.5 MG tablet Take 0.5 mg by mouth daily.  05/18/17   [provider]  rosuvastatin (CRESTOR) 40 MG tablet TAKE 1 TABLET BY MOUTH DAILY *PATIENT NEEDS APPOINTMENT* 12/08/20   Minus Breeding, MD  Sodium Phosphates (RA SALINE ENEMA RE) Place 1 Dose rectally as needed.    [provider]  zinc  sulfate 50 MG CAPS capsule Take 220 mg by mouth daily.  [provider]    Physical Exam: Vitals:   07/19/2021 1045 07/07/2021 1215 07/12/2021 1300 07/24/2021 1358  BP: (!) 106/57 115/68 (!) 108/58 (!) 89/64  Resp: (!) 24 (!) 23    Temp:       General:  Appeared obtunded, groaning and grunting periodically but without clear speech Eyes:  normal lids, iris ENT:  grossly normal lips & tongue Neck:  no LAD, masses or thyromegaly Cardiovascular:  Irregularly irregular with mild tachycardia, no m/r/g.  Respiratory:   CTA bilaterally with no wheezes/rales/rhonchi.  Mildly increased respiratory effort. Abdomen:  soft, NT, ND Skin:  RLE with stasis ulcers, both acute and chronic; L amputation stump was tightly wrapped at the time of my evaluation but when unwrapped by vascular wound was with purulent drainage and some wound dehiscence         Psychiatric:  obtunded, eyes would open to voice/touch and then close, nonsensical speech at the time of my evaluation Neurologic:  unable to perform   Radiological Exams on Admission: Independently reviewed - see discussion in A/P where applicable  CT Head Wo Contrast  Result Date: 07/10/2021 CLINICAL DATA:  57 year old female with altered mental status. Suspected small subdural hematoma along the falx last month. EXAM: CT HEAD WITHOUT CONTRAST TECHNIQUE: Contiguous axial images were obtained from the base of the skull through the vertex without intravenous contrast. RADIATION DOSE REDUCTION: This exam was performed according to the departmental dose-optimization program which includes automated exposure control, adjustment of the mA and/or kV according to patient size and/or use of iterative reconstruction technique. COMPARISON:  07/05/2021 and earlier. FINDINGS: Brain: Unchanged small hyperdensity along the right posterior falx near the tentorial incisor a since last year, series 3, image 20. Doubt acute or chronic blood products. No midline  shift, ventriculomegaly, mass effect, evidence of mass lesion, intracranial hemorrhage or evidence of cortically based acute infarction. Patchy bilateral white matter hypodensity. Stable gray-white matter differentiation throughout the brain. Generalized cerebral volume loss for age appears stable. Vascular: No suspicious intracranial vascular hyperdensity. Skull: No acute osseous abnormality identified. Sinuses/Orbits: Mild right mastoid effusion appears stable. Other Visualized paranasal sinuses and mastoids are stable and well aerated. Other: Generalized soft tissue swelling, edema suggesting anasarca, increased from earlier this month. Stable orbits soft tissues. IMPRESSION: 1. Small hyperdensity along the right posterior falx does not appear significantly changed since last year, doubtful for subdural blood. 2. No new intracranial abnormality. Stable generalized cerebral volume loss and patchy chronic white matter changes. 3. Generalized scalp edema suggesting anasarca. Electronically Signed   By: Genevie Ann M.D.   On: 07/08/2021 07:01    EKG: Independently reviewed.  Afib with rate 120; RBBB with no evidence of acute ischemia   Labs on Admission: I have personally reviewed the available labs and imaging studies at the time of the admission.  Pertinent labs:    ABG: 7.315/64.4/66/32.8/90% BUN 44/Creatinine 6.78/GFR 7 AP 188 Albumin 2.3 NH4 76 Lactate 1.3 WBC 8.1 Hgb 9.4 APAP <10 ASA <7   Assessment and Plan: Principal Problem:   Acute metabolic encephalopathy Active Problems:   Amputation stump infection (HCC)   PVD (peripheral vascular disease) (HCC)   PAF (paroxysmal atrial fibrillation) (HCC)   End-stage renal disease on hemodialysis (HCC)   Increased ammonia level   Hyperlipidemia associated with type 2 diabetes mellitus ([place])   Type 2 diabetes mellitus with diabetic chronic kidney disease ([place])    Acute metabolic encephalopathy -Patient presenting with encephalopathy as  evidenced by her obtunded/unresponsiveness -  this has apparently waxed and waned in the ER -This was similar to prior presentation and was initially thought to be related to medication misadventure -Uremia is also a lesser consideration as a contributing factor -NH4 was elevated without known h/o hepatic encephalopathy; will add lactulose enemas and recheck NH4 in AM -However, most likely etiology appears to be L BKA stump infection -Will admit to progressive care with telemetry monitoring  PVD s/p recent amputation, now with wound infection -Prior BKA on 5/2 -Mild superficial wound dehiscence at recent ortho appointment and so staples left in place -Currently with purulent drainage -Negative lactate, no current concerns for sepsis -Will treat with IV antibiotics (Rocephin/Flagyl/Vanc as per the lower extremity wound algorithm) -Vascular surgery is taking the patient to the OR soon for washout/debridement -She is at high risk of need for conversion to AKA -LE wound order set utilized including consults (diabetes coordinator; peripheral vascular navigator; TOC team; wound care; and nutrition)  -Holding Plavix while NPO   Afib -Currently NPO, resume Lopressor for rate control when able to pass swallow evaluation -Resumed on Eliquis during last hospitalization but uncertain when we last dose; asked to hold this by vascular -Will cover with DVT heparin dosing for now   End-stage renal disease on HD -Patient on chronic MWF HD -Nephrology prn order set utilized -She does not appear to be volume overloaded or otherwise in need of acute HD although uremia is a consideration -Nephrology is aware that patient will need HD  -Resume midodrine when able  Elevated ammonia -Prior CT (04/16/21) with hepatosplenomegaly -May have underlying cirrhosis -Will start lactulose -Consider GI consult pending progress -Will recheck NH4 in AM    DM2 -Was kept on insulin sliding scale during hospital stay  with good control, so I have discontinued her insulin 75/25 on discharge, this can be resumed if her CBG started to increase.   Hx of anxiety -resume hydroxyzine when able  HLD -Resume Crestor when able     Advance Care Planning:   Code Status: Full Code   Consults: Vascular surgery; nephrology; diabetes coordinator; peripheral vascular navigator; TOC team; wound care; and nutrition  DVT Prophylaxis: SQ Heparin  Family Communication: Son was present throughout evaluation and her sister Investment banker, corporate) was on the telephone throughout  Severity of Illness: The appropriate patient status for this patient is INPATIENT. Inpatient status is judged to be reasonable and necessary in order to provide the required intensity of service to ensure the patient's safety. The patient's presenting symptoms, physical exam findings, and initial radiographic and laboratory data in the context of their chronic comorbidities is felt to place them at high risk for further clinical deterioration. Furthermore, it is not anticipated that the patient will be medically stable for discharge from the hospital within 2 midnights of admission.   * I certify that at the point of admission it is my clinical judgment that the patient will require inpatient hospital care spanning beyond 2 midnights from the point of admission due to high intensity of service, high risk for further deterioration and high frequency of surveillance required.*  Author: Karmen Bongo, MD 07/23/2021 5:50 PM  For on call review [email or web address].

## 2021-07-20 NOTE — Consult Note (Addendum)
NAME:  [name], MRN:  [id number], DOB:  18-Nov-1965, LOS: 0 ADMISSION DATE:  07/06/2021, CONSULTATION DATE:  07/24/2021 REFERRING MD:  Dr. Lorin Mercy, CHIEF COMPLAINT: hypotension/ AMS   History of Present Illness:    56 year old female with PMH significant for but not limited to ESRD- MWF, DMT2, Afib on Eliquis, OSA not on CPAP, CAD, mitral regurgitation, HLD, HTN, HFpEF, anemia, PVD with L BKA, RA, neuropathy, anxiety, and HA who presented to ER today from home with complaints of altered mental status.    Recently underwent prior L BKA on 06/02/21 by Dr. Virl Cagey due to left foot wound with unreconstructable vascular disease.    Recent admit 5/15- 5/18 for toxic encephalopathy felt related to polypharmacy.   She was due for dialysis today but presented here for reasons above.  She has not missed iHD, actually received extra treatment 6/15.    Presented to OR altered and minimally responsive, felt possibly due to polypharmacy, not improved after narcan.   DL CVL placed in ER.  Left intubated due to hemodynamic instability during case, required Neo-Synephrine  Pertinent  Medical History  ESRD- MWF, DMT2, OSA not on CPAP, afib on Eliquis, CAD, mitral regurgitation, HLD, HTN, HFpEF, anemia, PVD with prior L BKA (06/02/21 by Dr. Virl Cagey) RA, neuropathy, anxiety, HA  Significant Hospital Events: Including procedures, antibiotic start and stop dates in addition to other pertinent events   6/19 I&D of left below-knee amputation stump , wound measured 2 x 1.5 x 5 cm deep, old hematoma removed 6/19 head CT stable cerebral atrophy, small hyperdensity along right posterior falx unchanged from last year  Interim History / Subjective:  Brought to the ICU intubated, Neo-Synephrine drip  Objective   Blood pressure (!) 89/64, temperature 98.3 F (36.8 C), resp. rate (!) 23.        Intake/Output Summary (Last 24 hours) at 07/22/2021 1844 Last data filed at 07/06/2021 1825 Gross per 24 hour  Intake  200 ml  Output --  Net 200 ml   There were no vitals filed for this visit.  Examination: General: Chronically ill-appearing woman, intubated, sedated on propofol HENT: Mild pallor, no icterus, no JVD, right IJ CVL Lungs: Decreased breath sounds bilateral, no accessory muscle use, no rhonchi Cardiovascular: S1-S2 regular, ESM 2/6 at base Abdomen: Soft obese, nontender, no hepatosplenomegaly Extremities: Left BKA stump, right leg bandage, right heel covered with dressing , chronic ischemic changes Neuro: RASS -3, on propofol drip   ABG from a.m. shows acute respiratory acidosis. Labs show normal potassium MRSA PCR positive Chest x-ray dependently reviewed shows ET tube and right IJ in position  Resolved Hospital Problem list     Assessment & Plan:  Septic shock due to infected left stump hematoma status post I&D -Brought back from the OR intubated, on Neo-Synephrine -Continue cefepime vancomycin , await wound cultures, will draw blood cultures x2. -Taper Neo-Synephrine, goal MAP 65 and above -Check lactate x1  Postop respiratory failure -left intubated due to volume overload and unresponsiveness prior to procedure -Vent settings reviewed and adjusted -Check ABG x1 since sats not picking up and decrease FiO2 -Plan for spontaneous breathing trial in a.m. with goal extubation  ESRD on HD -last HD was on 6/15, missed today, will plan for tomorrow a.m., renal following , schedule is Monday/Wednesday/Friday  Acute metabolic encephalopathy -Likely related to sepsis and hypercarbia , no response to Narcan -Using propofol and fentanyl as needed with goal RASS 0 to -1 -Daily WUA -Lactulose started empirically  for elevated ammonia of 76, can discontinue once mental status improved  Chronic atrial fibrillation on Eliquis -resume when enteral access obtained Currently rate controlled, hold metoprolol until blood pressure improves CAD -resume Plavix when enteral access obtained Chronic  hypotension -midodrine on hold Severe pulmonary hypertension -needs preload, caution with high-dose propofol  Diabetes type 2 -holding Lantus while n.p.o. SSI, CBG every 4   Best Practice (right click and "Reselect all SmartList Selections" daily)   Diet/type: NPO DVT prophylaxis: DOAC GI prophylaxis: PPI Lines: Central line Foley:  N/A Code Status:  full code Last date of multidisciplinary goals of care discussion [NA] son updated  Labs   CBC: Recent Labs  Lab 07/09/2021 0438 07/13/2021 0956  WBC 8.1  --   NEUTROABS 6.1  --   HGB 9.4* 10.2*  HCT 31.7* 30.0*  MCV 100.0  --   PLT 222  --     Basic Metabolic Panel: Recent Labs  Lab 07/04/2021 0438 07/30/2021 0956  NA 139 139  K 3.8 3.6  CL 97*  --   CO2 28  --   GLUCOSE 84  --   BUN 44*  --   CREATININE 6.78*  --   CALCIUM 9.5  --    GFR: CrCl cannot be calculated (Unknown ideal weight.). Recent Labs  Lab 07/30/2021 0438  WBC 8.1  LATICACIDVEN 1.3    Liver Function Tests: Recent Labs  Lab 07/02/2021 0438  AST 15  ALT 16  ALKPHOS 188*  BILITOT 0.9  PROT 7.5  ALBUMIN 2.3*   No results for input(s): "LIPASE", "AMYLASE" in the last 168 hours. Recent Labs  Lab 07/03/2021 0438  AMMONIA 76*    ABG    Component Value Date/Time   PHART 7.315 (L) 07/31/2021 0956   PCO2ART 64.4 (H) 07/12/2021 0956   PO2ART 66 (L) 07/07/2021 0956   HCO3 32.8 (H) 07/18/2021 0956   TCO2 35 (H) 07/31/2021 0956   O2SAT 90 07/12/2021 0956     Coagulation Profile: No results for input(s): "INR", "PROTIME" in the last 168 hours.  Cardiac Enzymes: No results for input(s): "CKTOTAL", "CKMB", "CKMBINDEX", "TROPONINI" in the last 168 hours.  HbA1C: Hemoglobin A1C  Date/Time Value Ref Range Status  05/01/2020 08:28 AM 9.7 (A) 4.0 - 5.6 % Final   Hgb A1c MFr Bld  Date/Time Value Ref Range Status  04/26/2021 02:22 PM 11.3 (H) 4.8 - 5.6 % Final    Comment:    (NOTE) Pre diabetes:          5.7%-6.4%  Diabetes:               >6.4%  Glycemic control for   <7.0% adults with diabetes   02/18/2020 07:06 PM 8.5 (H) 4.8 - 5.6 % Final    Comment:    (NOTE) Pre diabetes:          5.7%-6.4%  Diabetes:              >6.4%  Glycemic control for   <7.0% adults with diabetes     CBG: Recent Labs  Lab 07/11/2021 0421 07/15/2021 1358 07/31/2021 1444 07/29/2021 1725  GLUCAP 86 52* 97 76    Review of Systems:   Unable to obtain due to altered mental status, intubated  Past Medical History:  She,  has a past medical history of Anaphylactic shock, unspecified, initial encounter (09/04/2018), Anemia, Anxiety, CAD (coronary artery disease), CHF (congestive heart failure) ([place]), Chronic bilateral pleural effusions (10/23/2018), COVID-19 virus infection (09/18/2018), ESRD (end  stage renal disease) ([place]), Gangrene ([place]) (09/18/2018), Headache(784.0), High cholesterol, History of blood transfusion, Hypertension, Mitral regurgitation, Neuropathy (01/29/2011), Pericardial effusion, PVD (peripheral vascular disease) ([place]), Restless legs, Rheumatoid arthritis ([place]), Sleep apnea, Thoracic ascending aortic aneurysm ([place]), Type II diabetes mellitus ([place]), and Uterine leiomyoma (01/10/2021).   Surgical History:   Past Surgical History:  Procedure Laterality Date   ABDOMINAL AORTOGRAM W/LOWER EXTREMITY N/A 05/27/2021   Procedure: ABDOMINAL AORTOGRAM W/LOWER EXTREMITY;  Surgeon: Broadus John, MD;  Location: [facility] [facility];  Service: Cardiovascular;  Laterality: N/A;   AMPUTATION Left 12/27/2018   Procedure: LEFT THUMB REVISION AMPUTATION DIGIT;  Surgeon: Charlotte Crumb, MD;  Location: [place];  Service: Orthopedics;  Laterality: Left;   AMPUTATION Left 01/15/2021   Procedure: AMPUTATION OF DIGIT LEFT INDEX FINGER;  Surgeon: Leanora Cover, MD;  Location: [place];  Service: Orthopedics;  Laterality: Left;   AMPUTATION Left 05/28/2021   Procedure: LEFT 5TH TOE RAY AMPUTATION;  Surgeon: Waynetta Sandy, MD;  Location: [place];   Service: Vascular;  Laterality: Left;   AMPUTATION Left 06/02/2021   Procedure: AMPUTATION BELOW KNEE;  Surgeon: Broadus John, MD;  Location: [facility];  Service: Vascular;  Laterality: Left;   AV FISTULA PLACEMENT Left    AV FISTULA PLACEMENT Right 03/12/2019   Procedure: INSERTION OF ARTERIOVENOUS (AV) GORE-TEX GRAFT ARM;  Surgeon: Elam Dutch, MD;  Location: [place];  Service: Vascular;  Laterality: Right;   AV FISTULA PLACEMENT Right 08/13/2019   Procedure: RIGHT UPPER ARM ARTERIOVENOUS (AV) FISTULA CREATION WITH BASILIC VEIN;  Surgeon: Elam Dutch, MD;  Location: [place];  Service: Vascular;  Laterality: Right;   [place]; 1997   x 2   COLONOSCOPY     EYE SURGERY Bilateral    cataract surgery   I & D EXTREMITY Left 01/09/2021   Procedure: IRRIGATION AND DEBRIDEMENT INDEX FINGER;  Surgeon: Leanora Cover, MD;  Location: [place];  Service: Orthopedics;  Laterality: Left;   INSERTION OF DIALYSIS CATHETER Right 09/26/2018   Procedure: INSERTION OF DIALYSIS CATHETER Right Internal Jugular.;  Surgeon: Elam Dutch, MD;  Location[facility];  Service: Vascular;  Laterality: Right;   IR DIALY SHUNT INTRO NEEDLE/INTRACATH INITIAL W/IMG RIGHT Right 11/21/2020   IR FLUORO GUIDE CV LINE RIGHT  05/04/2019   IR US GUIDE VASC ACCESS RIGHT  05/04/2019   IR US GUIDE VASC ACCESS RIGHT  11/21/2020   LIGATION OF ARTERIOVENOUS  FISTULA Left 09/26/2018   Procedure: LIGATION OF ARTERIOVENOUS  FISTULA LEFT ARM;  Surgeon: Elam Dutch, MD;  Location: [place];  Service: Vascular;  Laterality: Left;   PERICARDIAL WINDOW  02/2004   for pericardial effusion   PERIPHERAL VASCULAR ATHERECTOMY  05/27/2021   Procedure: PERIPHERAL VASCULAR ATHERECTOMY;  Surgeon: Broadus John, MD;  Location: [place] [facility];  Service: Cardiovascular;;  Left AT   PERIPHERAL VASCULAR INTERVENTION Right 10/26/2019   Procedure: PERIPHERAL VASCULAR INTERVENTION;  Surgeon: Elam Dutch, MD;  Location: [facility];   Service: Cardiovascular;  Laterality: Right;  arm  AV fistula   TUBAL LIGATION  05/1995   UPPER EXTREMITY VENOGRAPHY N/A 06/22/2019   Procedure: UPPER EXTREMITY VENOGRAPHY - Right Central;  Surgeon: Elam Dutch, MD;  Location: [place] [facility];  Service: Cardiovascular;  Laterality: N/A;     Social History:   reports that she has been smoking cigarettes. She has a 8.25 pack-year smoking history. She has never used smokeless tobacco. She reports  that she does not drink alcohol and does not use drugs.   Family History:  Her family history includes Cancer in her brother; Dementia in her mother; Diabetes in her brother, father, mother, and sister; Heart disease in her father; Hyperlipidemia in her father; Hypertension in her brother, father, mother, and sister; Stroke in her father and mother.   Allergies Allergies  Allergen Reactions   Bupropion Itching   Dilaudid [Hydromorphone] Other (See Comments)    Apnea, required intubation     Home Medications  Prior to Admission medications   Medication Sig Start Date End Date Taking? Authorizing Provider  hydrOXYzine (ATARAX/VISTARIL) 25 MG tablet Take 25 mg by mouth every morning.   Yes [provider]  acetaminophen (TYLENOL) 500 MG tablet Take 1,000 mg by mouth daily as needed for headache (pain).    [provider]  albuterol (VENTOLIN HFA) 108 (90 Base) MCG/ACT inhaler Inhale 2 puffs into the lungs every 6 (six) hours as needed for wheezing or shortness of breath. 02/22/20   Thurnell Lose, MD  apixaban (ELIQUIS) 5 MG TABS tablet Take 1 tablet (5 mg total) by mouth 2 (two) times daily. 06/11/21   Gherghe, Vella Redhead, MD  AURYXIA 1 GM 210 MG(Fe) tablet Take 420-840 mg by mouth See admin instructions. 840 mg twice daily with meals,  420 mg with a snacks 05/29/18   [provider]  b complex-vitamin c-folic acid (NEPHRO-VITE) 0.8 MG TABS tablet Take 1 tablet by mouth every Monday, Wednesday, and Friday with  hemodialysis. 04/12/19   [provider]  bisacodyl (DULCOLAX) 10 MG suppository Place 10 mg rectally as needed for moderate constipation.    [provider]  clopidogrel (PLAVIX) 75 MG tablet Take 1 tablet (75 mg total) by mouth daily with breakfast. 06/11/21   Caren Griffins, MD  doxycycline (VIBRA-TABS) 100 MG tablet Take 1 tablet (100 mg total) by mouth 2 (two) times daily. 06/18/21   Elgergawy, Silver Huguenin, MD  HUMIRA PEN 40 MG/0.4ML PNKT Inject 40 mg into the skin every 14 (fourteen) days. 08/22/20   [provider]  insulin aspart (NOVOLOG FLEXPEN) 100 UNIT/ML FlexPen Inject 5 Units into the skin 3 (three) times daily with meals. And at bedtime for CBG greater than 200    [provider]  Methoxy PEG-Epoetin Beta (MIRCERA IJ) Dialysis Monday,Wednesday and friday 12/19/20 12/18/21  [provider]  metoprolol tartrate (LOPRESSOR) 25 MG tablet Take 0.5 tablets (12.5 mg total) by mouth 2 (two) times daily. 06/11/21   Caren Griffins, MD  midodrine (PROAMATINE) 5 MG tablet Take 5 mg by mouth 3 (three) times daily with meals. For hypotension    [provider]  NARCAN 4 MG/0.1ML LIQD nasal spray kit Place 1 spray into the nose as needed (accidental overdose.). 10/10/19   [provider]  NON FORMULARY BIPAP at bedtime    [provider]  omega-3 fish oil (MAXEPA) 1000 MG CAPS capsule Take 1,000 mg by mouth daily.    [provider]  OXYGEN Inhale 2 L into the lungs continuous.    [provider]  pantoprazole (PROTONIX) 40 MG tablet Take 1 tablet (40 mg total) by mouth at bedtime. 06/18/21   Elgergawy, Silver Huguenin, MD  Pollen Extracts (PROSTAT PO) Take 30 mLs by mouth in the morning and at bedtime.    [provider]  pramipexole (MIRAPEX) 0.5 MG tablet Take 0.5 mg by mouth daily.  05/18/17   [provider]  rosuvastatin (  CRESTOR) 40 MG tablet TAKE 1 TABLET BY MOUTH DAILY *PATIENT NEEDS APPOINTMENT*  12/08/20   Minus Breeding, MD  Sodium Phosphates (RA SALINE ENEMA RE) Place 1 Dose rectally as needed.    [provider]  zinc sulfate 50 MG CAPS capsule Take 220 mg by mouth daily.    [provider]     Critical care time: 36m    RKara MeadMD. FChi [facility] [place] Pager : [phone]  If no response to pager , please call [phone] until 7 pm After 7:00 pm call Elink  [phone]  07/26/2021

## 2021-07-20 NOTE — ED Notes (Signed)
Pt BP soft. MD made aware.

## 2021-07-20 NOTE — Anesthesia Preprocedure Evaluation (Addendum)
Anesthesia Evaluation  Patient identified by MRN, date of birth, ID band  Reviewed: Allergy & Precautions, NPO status , Patient's Chart, lab work & pertinent test results, reviewed documented beta blocker date and time   Airway Mallampati: III  TM Distance: >3 FB Neck ROM: Full    Dental  (+) Dental Advisory Given   Pulmonary sleep apnea , Current Smoker and Patient abstained from smoking.,    Pulmonary exam normal        Cardiovascular hypertension, Pt. on home beta blockers pulmonary hypertension+ CAD, + Peripheral Vascular Disease and +CHF  Normal cardiovascular exam+ Valvular Problems/Murmurs MR    Echo 06/05/21: EF 60-65%, mod LVH, mildly reduced RVSP, severe pulm HTN (RVSP 71.9), mild MR, mod TR   Neuro/Psych  Headaches, PSYCHIATRIC DISORDERS Anxiety  Neuromuscular disease    GI/Hepatic negative GI ROS, Neg liver ROS,   Endo/Other  diabetes, Insulin DependentMorbid obesity  Renal/GU ESRF and DialysisRenal disease (last dialysis Friday)     Musculoskeletal  (+) Arthritis , Rheumatoid disorders,    Abdominal   Peds  Hematology  (+) Blood dyscrasia, anemia ,   Anesthesia Other Findings   Reproductive/Obstetrics                            Anesthesia Physical  Anesthesia Plan  ASA: 4  Anesthesia Plan: General   Post-op Pain Management: Ofirmev IV (intra-op)*   Induction: Intravenous  PONV Risk Score and Plan: 2 and Ondansetron, Dexamethasone and Treatment may vary due to age or medical condition  Airway Management Planned: LMA  Additional Equipment: Arterial line, CVP and Ultrasound Guidance Line Placement  Intra-op Plan:   Post-operative Plan: Extubation in OR  Informed Consent: I have reviewed the patients History and Physical, chart, labs and discussed the procedure including the risks, benefits and alternatives for the proposed anesthesia with the patient or authorized  representative who has indicated his/her understanding and acceptance.     Consent reviewed with POA  Plan Discussed with:   Anesthesia Plan Comments: ( Pt very somnolent, able to answer short questions appropriately but falling asleep frequently during interview. History and consent reviewed by phone with husband.   Pt obtunded and obstructing, concerned clinical condition is worsening in setting of infection, polypharmacy, lack of dialysis. Will RSI patient, intubate, and transfer to ICU post procedure for HD and critical care. )      Anesthesia Quick Evaluation

## 2021-07-20 NOTE — Anesthesia Procedure Notes (Signed)
Central Venous Catheter Insertion Start/End06/03/2021 6:21 PM, 07/18/2021 6:31 PM Patient location: OR. Preanesthetic checklist: patient identified, IV checked, site marked, risks and benefits discussed, surgical consent, monitors and equipment checked, pre-op evaluation, timeout performed and anesthesia consent Position: supine Patient sedated Hand hygiene performed  and maximum sterile barriers used  Catheter size: 8 Fr Total catheter length 16. Central line was placed.Double lumen Procedure performed using ultrasound guided technique. Ultrasound Notes:anatomy identified, needle tip was noted to be adjacent to the nerve/plexus identified, no ultrasound evidence of intravascular and/or intraneural injection and image(s) printed for medical record Attempts: 1 Following insertion, dressing applied, line sutured and Biopatch. Post procedure assessment: blood return through all ports, free fluid flow and no air  Patient tolerated the procedure well with no immediate complications.

## 2021-07-20 NOTE — Progress Notes (Signed)
Per report from CRNA, pt has not had a Spo2 pleth. RN can not get a Spo2 pleth reading despite multiple attempts with pulse ox. RN notified Elsworth Soho, MD. Orders to check ABG.

## 2021-07-20 NOTE — ED Notes (Signed)
Unable to get pulse ox to read. Patient has 4 placed in numerous locations. Right big toe, forehead. Right ring finger. And right ear lobe. Pulse ox cord changed out with no improvement.

## 2021-07-20 NOTE — Op Note (Signed)
Date: July 20, 2021  Preoperative diagnosis: Infected left below-knee amputation  Postoperative diagnosis: Same  Procedure: Incision and drainage of left below-knee amputation stump  Surgeon: Dr. Marty Heck, MD  Assistant: Leontine Locket, PA  Indications: 56 year old female presented with altered mental status and found to have infected left below-knee amputation.  She presents for incision and drainage after risk benefits discussed.  Findings: Foul purulent drainage from the left below-knee amputation incision.  After this was opened, cultures were sent intraoperatively.  The tissues themselves were viable and appeared to be an infected hematoma.  There was some granulation tissue over the tibia.  Anesthesia: General  Details: Patient was taken to the operating room after informed consent was obtained.  Placed on the operative table supine position.  General endotracheal anesthesia was induced.  Ultimately the left leg amputation was prepped and draped in standard sterile fashion.  Antibiotics were given.  Timeout performed.  Initially opened all the staples along the midportion of the incision.  Cultures were sent intraoperative.  This was opened along the entire course of the wound.  The wound measured approximately 2 cm wide by 1-1/2 cm long by 5 cm deep.  We then copiously irrigated this.  All the underlying tissue appeared viable and we did scooped out a bunch of old hematoma.  There was granulation tissue over the tibia.  We packed this with Betadine soaked gauze and a dry dressing.  Complication: None  Condition: Stable  Marty Heck, MD [facility] Office: [place]

## 2021-07-20 NOTE — ED Triage Notes (Signed)
Pt BIB [facility] from home c/o leg pain. Per EMS pt appeared altered and lethargic, leg is warm to touch. Pt denies any drug use and is very drowsy.  DX- dialysis, R BKA

## 2021-07-20 NOTE — ED Provider Notes (Incomplete)
[facility] EMERGENCY DEPARTMENT Provider Note   CSN: [id number] Arrival date & time: 07/10/2021  0410     History  Chief Complaint  Patient presents with   Altered Mental Status    TERRALYN MATSUMURA is a 56 y.o. female.  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  Patient brought by EMS from her home for evaluation of altered mental status.  Per paramedics, family called 31 stating that she was difficult to arouse and somnolent.  Patient with recent visits involving subdural bleeding and also suspected overdose of prescription pain medications.  Patient has no additional history secondary to medical condition.  The history is provided by the patient.       Home Medications Prior to Admission medications   Medication Sig Start Date End Date Taking? Authorizing Provider  acetaminophen (TYLENOL) 500 MG tablet Take 1,000 mg by mouth daily as needed for headache (pain).    [provider]  albuterol (VENTOLIN HFA) 108 (90 Base) MCG/ACT inhaler Inhale 2 puffs into the lungs every 6 (six) hours as needed for wheezing or shortness of breath. 02/22/20   Thurnell Lose, MD  apixaban (ELIQUIS) 5 MG TABS tablet Take 1 tablet (5 mg total) by mouth 2 (two) times daily. 06/11/21   Gherghe, Vella Redhead, MD  AURYXIA 1 GM 210 MG(Fe) tablet Take 420-840 mg by mouth See admin instructions. 840 mg twice daily with meals,  420 mg with a snacks 05/29/18   [provider]  b complex-vitamin c-folic acid (NEPHRO-VITE) 0.8 MG TABS tablet Take 1 tablet by mouth every Monday, Wednesday, and Friday with hemodialysis. 04/12/19   [provider]  bisacodyl (DULCOLAX) 10 MG suppository Place 10 mg rectally as needed for moderate constipation.    [provider]   clopidogrel (PLAVIX) 75 MG tablet Take 1 tablet (75 mg total) by mouth daily with breakfast. 06/11/21   Caren Griffins, MD  doxycycline (VIBRA-TABS) 100 MG tablet Take 1 tablet (100 mg total) by mouth 2 (two) times daily. 06/18/21   Elgergawy, Silver Huguenin, MD  HUMIRA PEN 40 MG/0.4ML PNKT Inject 40 mg into the skin every 14 (fourteen) days. 08/22/20   [provider]  hydrOXYzine (ATARAX/VISTARIL) 25 MG tablet Take 25 mg by mouth every morning.    [provider]  insulin aspart (NOVOLOG FLEXPEN) 100 UNIT/ML FlexPen Inject 5 Units into the skin 3 (three) times daily with meals. And at bedtime for CBG greater than 200    [provider]  Methoxy PEG-Epoetin Beta (MIRCERA IJ) Dialysis Monday,Wednesday and friday 12/19/20 12/18/21  [provider]  metoprolol tartrate (LOPRESSOR) 25 MG tablet Take 0.5 tablets (12.5 mg total) by mouth 2 (two) times daily. 06/11/21   Caren Griffins, MD  midodrine (PROAMATINE) 5 MG tablet Take 5 mg by mouth 3 (three) times daily with meals. For hypotension    [provider]  NARCAN 4 MG/0.1ML LIQD nasal spray kit Place 1 spray into the nose as needed (accidental overdose.). 10/10/19   [provider]  NON FORMULARY BIPAP at bedtime    [provider]  omega-3 fish oil (MAXEPA) 1000 MG CAPS capsule Take 1,000 mg by mouth daily.    [provider]  OXYGEN Inhale 2 L into the lungs continuous.    [provider]  pantoprazole (  PROTONIX) 40 MG tablet Take 1 tablet (40 mg total) by mouth at bedtime. 06/18/21   Elgergawy, Silver Huguenin, MD  Pollen Extracts (PROSTAT PO) Take 30 mLs by mouth in the morning and at bedtime.    [provider]  pramipexole (MIRAPEX) 0.5 MG tablet Take 0.5 mg by mouth daily.  05/18/17   [provider]  rosuvastatin (CRESTOR) 40 MG tablet TAKE 1 TABLET BY MOUTH DAILY *PATIENT NEEDS APPOINTMENT* 12/08/20   Minus Breeding, MD  Sodium Phosphates (RA SALINE ENEMA  RE) Place 1 Dose rectally as needed.    [provider]  zinc sulfate 50 MG CAPS capsule Take 220 mg by mouth daily.    [provider]      Allergies    Bupropion and Dilaudid [hydromorphone]    Review of Systems   Review of Systems  All other systems reviewed and are negative.   Physical Exam Updated Vital Signs LMP  (LMP Unknown) Comment: LMP Feb 2011 Physical Exam Vitals and nursing note reviewed.  Constitutional:      Appearance: She is well-developed.     Comments: Patient is an acutely on chronically ill-appearing female.  She is somnolent, but arousable.  HENT:     Head: Normocephalic and atraumatic.  Eyes:     Comments: Pupils are 1 mm and minimally reactive.  Cardiovascular:     Rate and Rhythm: Normal rate and regular rhythm.     Heart sounds: No murmur heard.    No friction rub. No gallop.  Pulmonary:     Effort: Pulmonary effort is normal. No respiratory distress.     Breath sounds: Normal breath sounds. No wheezing.  Abdominal:     General: Bowel sounds are normal. There is no distension.     Palpations: Abdomen is soft.     Tenderness: There is no abdominal tenderness.  Musculoskeletal:        General: Normal range of motion.     Comments: She has multiple sores noted to the right lower extremity.  Left lower extremity is status post BKA.  Skin:    General: Skin is warm and dry.  Neurological:     General: No focal deficit present.     Comments: Patient's somnolent but arousable, then drifts back to sleep.     ED Results / Procedures / Treatments   Labs (all labs ordered are listed, but only abnormal results are displayed) Labs Reviewed  COMPREHENSIVE METABOLIC PANEL  CBC WITH DIFFERENTIAL/PLATELET  ACETAMINOPHEN LEVEL  SALICYLATE LEVEL  LACTIC ACID, PLASMA  LACTIC ACID, PLASMA  ETHANOL  AMMONIA  CBG MONITORING, ED    EKG EKG Interpretation  Date/Time:  Monday July 20 2021 04:18:33 EDT Ventricular Rate:  120 PR  Interval:    QRS Duration: 154 QT Interval:  313 QTC Calculation: 443 R Axis:   47 Text Interpretation: Atrial fibrillation Right bundle branch block No significant change since 06/22/2021 Confirmed by Veryl Speak [phone]) on 07/16/2021 4:24:16 AM  Radiology No results found.  Procedures Procedures    Medications Ordered in ED Medications - No data to display  ED Course/ Medical Decision Making/ A&P  This patient presents to the ED for concern of altered mental status, this involves an extensive number of treatment options, and is a complaint that carries with it a high risk of complications and morbidity.  The differential diagnosis includes opioid overdose, acute CVA, metabolic coma, intracranial hemorrhage   Co morbidities that complicate the patient evaluation  None  Additional history obtained:  No external records or additional history needed   Lab Tests:  I Ordered, and personally interpreted labs.  The pertinent results include: Metabolic panel consistent with baseline.  Laboratory studies otherwise unremarkable.   Imaging Studies ordered:  I ordered imaging studies including CT head  I independently visualized and interpreted imaging which showed *** I agree with the radiologist interpretation   Cardiac Monitoring: / EKG:  The patient was maintained on a cardiac monitor.  I personally viewed and interpreted the cardiac monitored which showed an underlying rhythm of: Atrial fibrillation   Consultations Obtained:  I requested consultation with the ***,  and discussed lab and imaging findings as well as pertinent plan - they recommend: ***   Problem List / ED Course / Critical interventions / Medication management  *** I ordered medication including ***  for ***  Reevaluation of the patient after these medicines showed that the patient {resolved/improved/worsened:23923::"improved"} I have reviewed the patients home medicines and have made adjustments  as needed   Social Determinants of Health:  ***   Test / Admission - Considered:  ***   Final Clinical Impression(s) / ED Diagnoses Final diagnoses:  None    Rx / DC Orders ED Discharge Orders     None

## 2021-07-20 NOTE — ED Notes (Addendum)
This paramedic attempted to get a pulse ox reading with [employer]. I used the various leads I had already applied. Still unable to receive a reading.

## 2021-07-20 NOTE — Anesthesia Procedure Notes (Signed)
Procedure Name: Intubation Date/Time: 07/05/2021 5:57 PM  Performed by: Josephine Igo, CRNAPre-anesthesia Checklist: Patient identified, Emergency Drugs available, Suction available, Patient being monitored and Timeout performed Patient Re-evaluated:Patient Re-evaluated prior to induction Oxygen Delivery Method: Circle system utilized Preoxygenation: Pre-oxygenation with 100% oxygen Induction Type: IV induction Laryngoscope Size: Miller and 2 Grade View: Grade I Tube type: Oral Tube size: 7.0 mm Number of attempts: 1 Airway Equipment and Method: Stylet Placement Confirmation: ETT inserted through vocal cords under direct vision, positive ETCO2 and breath sounds checked- equal and bilateral Secured at: 22 cm Tube secured with: Tape Dental Injury: Teeth and Oropharynx as per pre-operative assessment

## 2021-07-20 NOTE — Consult Note (Addendum)
[facility] Renal Consultation Note    Indication for Consultation:  Management of ESRD/hemodialysis; anemia, hypertension/volume and secondary hyperparathyroidism PCP:  HPI: [name] is a 56 y.o. female with ESRD on hemodialysis MWF at [facility]. 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  Recent admission 05/15-05/18/2023 for toxic encephalopathy with possible opioid overuse, possible 4 mm subdural hematoma seen on CT. She was discharged to SNF but is now coming from home.   She was brought to ED this AM with C/O abdominal pain. Apparently was awake at home but since arrival to ED has been altered. SCr 6.78 BUN 44 CO2 28. WBC 8.1 HGB 9.4 Lactic acid 1.3. She has NOT missed HD, did have extra treatment 07/16/2021. She did not respond to narcan given in ED however she was awake and responsive when talking to VVS PA. She was seen by VVS for C/O pain in L stump. Mild dehiscence, copious purulent drainage noted. She has been taken to OR for possible AKA.   Past Medical History:  Diagnosis Date   Anaphylactic shock, unspecified, initial encounter 09/04/2018   Anemia    Anxiety    CAD (coronary artery disease)    Nonobstructive on CT 2019   CHF (congestive heart failure) (HCC)    Chronic bilateral pleural effusions 10/23/2018   COVID-19 virus infection 09/18/2018   ESRD (end stage renal disease) ([place])    Dialysis [facility]- 3rd st   Gangrene ([place]) 09/18/2018   Headache(784.0)    High cholesterol    History of blood transfusion    Hypertension    Mitral regurgitation    moderate to severe MR 10/2018 echo   Neuropathy 01/29/2011   04/2011 MRI L-spine:  L5-S1: Bulge/shallow broad-based protrusion greatest centrally and in the right posterior lateral position. Minimal crowding of the  upper aspect of the S1 nerve root greater on the right. Left lateral disc osteophyte with  mild encroachment upon but not  significant compression of the exiting left L5 nerve root.   05/2011: Dr. Sherwood Gambler ([facility]): DJD and mild disc bu   Pericardial effusion    PVD (peripheral vascular disease) ([place])    Right leg stent in [place].  (No records)   Restless legs    Rheumatoid arthritis ([place])    "knees" "hands", "RA"   Sleep apnea    does not use Cpap   Thoracic ascending aortic aneurysm (HCC)    4.4 cm 11/14/18 CTA   Type II diabetes mellitus (HCC)    Uterine leiomyoma 01/10/2021   Past Surgical History:  Procedure Laterality Date   ABDOMINAL AORTOGRAM W/LOWER EXTREMITY N/A 05/27/2021   Procedure: ABDOMINAL AORTOGRAM W/LOWER EXTREMITY;  Surgeon: Broadus John, MD;  Location: [place] [facility];  Service: Cardiovascular;  Laterality: N/A;   AMPUTATION Left 12/27/2018   Procedure: LEFT THUMB REVISION AMPUTATION DIGIT;  Surgeon: Charlotte Crumb, MD;  Location: [place];  Service: Orthopedics;  Laterality: Left;   AMPUTATION Left 01/15/2021   Procedure: AMPUTATION OF DIGIT LEFT INDEX FINGER;  Surgeon: Leanora Cover, MD;  Location: [place];  Service: Orthopedics;  Laterality: Left;   AMPUTATION Left 05/28/2021   Procedure: LEFT 5TH TOE RAY AMPUTATION;  Surgeon: Waynetta Sandy, MD;  Location: [place];  Service: Vascular;  Laterality: Left;   AMPUTATION Left 06/02/2021   Procedure: AMPUTATION BELOW KNEE;  Surgeon: Broadus John, MD;  Location: [place];  Service: Vascular;  Laterality: Left;   AV FISTULA PLACEMENT Left    AV FISTULA PLACEMENT Right 03/12/2019   Procedure: INSERTION OF ARTERIOVENOUS (AV) GORE-TEX GRAFT ARM;  Surgeon: Elam Dutch, MD;  Location: [facility] OR;  Service: Vascular;  Laterality: Right;   AV FISTULA PLACEMENT Right 08/13/2019   Procedure: RIGHT UPPER ARM ARTERIOVENOUS (AV) FISTULA CREATION WITH BASILIC VEIN;  Surgeon: Elam Dutch, MD;  Location: [place];  Service: Vascular;  Laterality: Right;   [place]; 1997   x 2    COLONOSCOPY     EYE SURGERY Bilateral    cataract surgery   I & D EXTREMITY Left 01/09/2021   Procedure: IRRIGATION AND DEBRIDEMENT INDEX FINGER;  Surgeon: Leanora Cover, MD;  Location: [place];  Service: Orthopedics;  Laterality: Left;   INSERTION OF DIALYSIS CATHETER Right 09/26/2018   Procedure: INSERTION OF DIALYSIS CATHETER Right Internal Jugular.;  Surgeon: Elam Dutch, MD;  Location: [facility] OR;  Service: Vascular;  Laterality: Right;   IR DIALY SHUNT INTRO NEEDLE/INTRACATH INITIAL W/IMG RIGHT Right 11/21/2020   IR FLUORO GUIDE CV LINE RIGHT  05/04/2019   IR US GUIDE VASC ACCESS RIGHT  05/04/2019   IR US GUIDE VASC ACCESS RIGHT  11/21/2020   LIGATION OF ARTERIOVENOUS  FISTULA Left 09/26/2018   Procedure: LIGATION OF ARTERIOVENOUS  FISTULA LEFT ARM;  Surgeon: Elam Dutch, MD;  Location: [place];  Service: Vascular;  Laterality: Left;   PERICARDIAL WINDOW  02/2004   for pericardial effusion   PERIPHERAL VASCULAR ATHERECTOMY  05/27/2021   Procedure: PERIPHERAL VASCULAR ATHERECTOMY;  Surgeon: Broadus John, MD;  Location: [place] [facility];  Service: Cardiovascular;;  Left AT   PERIPHERAL VASCULAR INTERVENTION Right 10/26/2019   Procedure: PERIPHERAL VASCULAR INTERVENTION;  Surgeon: Elam Dutch, MD;  Location: [place] [facility];  Service: Cardiovascular;  Laterality: Right;  arm  AV fistula   TUBAL LIGATION  05/1995   UPPER EXTREMITY VENOGRAPHY N/A 06/22/2019   Procedure: UPPER EXTREMITY VENOGRAPHY - Right Central;  Surgeon: Elam Dutch, MD;  Location: [place] [facility];  Service: Cardiovascular;  Laterality: N/A;   Family History  Problem Relation Age of Onset   Cancer Brother    Heart disease Father        Died age 51   Diabetes Father    Hyperlipidemia Father    Hypertension Father    Stroke Father    Diabetes Mother    Hypertension Mother    Stroke Mother    Dementia Mother    Diabetes Sister    Diabetes Brother    Hypertension Sister    Hypertension Brother     Social History:  reports that she has been smoking cigarettes. She has a 8.25 pack-year smoking history. She has never used smokeless tobacco. She reports that she does not drink alcohol and does not use drugs. Allergies  Allergen Reactions   Bupropion Itching   Dilaudid [Hydromorphone] Other (See Comments)    Apnea, required intubation   Prior to Admission medications   Medication Sig Start Date End Date Taking? Authorizing Provider  hydrOXYzine (ATARAX/VISTARIL) 25 MG tablet Take 25 mg by mouth every morning.   Yes [provider]  acetaminophen (TYLENOL) 500 MG tablet Take 1,000 mg by mouth daily as needed for headache (pain).    [provider]  albuterol (VENTOLIN HFA) 108 (90 Base) MCG/ACT inhaler Inhale 2 puffs into the lungs every 6 (six) hours as needed for wheezing or shortness of  breath. 02/22/20   Thurnell Lose, MD  apixaban (ELIQUIS) 5 MG TABS tablet Take 1 tablet (5 mg total) by mouth 2 (two) times daily. 06/11/21   Gherghe, Vella Redhead, MD  AURYXIA 1 GM 210 MG(Fe) tablet Take 420-840 mg by mouth See admin instructions. 840 mg twice daily with meals,  420 mg with a snacks 05/29/18   [provider]  b complex-vitamin c-folic acid (NEPHRO-VITE) 0.8 MG TABS tablet Take 1 tablet by mouth every Monday, Wednesday, and Friday with hemodialysis. 04/12/19   [provider]  bisacodyl (DULCOLAX) 10 MG suppository Place 10 mg rectally as needed for moderate constipation.    [provider]  clopidogrel (PLAVIX) 75 MG tablet Take 1 tablet (75 mg total) by mouth daily with breakfast. 06/11/21   Caren Griffins, MD  doxycycline (VIBRA-TABS) 100 MG tablet Take 1 tablet (100 mg total) by mouth 2 (two) times daily. 06/18/21   Elgergawy, Silver Huguenin, MD  HUMIRA PEN 40 MG/0.4ML PNKT Inject 40 mg into the skin every 14 (fourteen) days. 08/22/20   [provider]  insulin aspart (NOVOLOG FLEXPEN) 100 UNIT/ML FlexPen Inject 5 Units into the skin 3  (three) times daily with meals. And at bedtime for CBG greater than 200    [provider]  Methoxy PEG-Epoetin Beta (MIRCERA IJ) Dialysis Monday,Wednesday and friday 12/19/20 12/18/21  [provider]  metoprolol tartrate (LOPRESSOR) 25 MG tablet Take 0.5 tablets (12.5 mg total) by mouth 2 (two) times daily. 06/11/21   Caren Griffins, MD  midodrine (PROAMATINE) 5 MG tablet Take 5 mg by mouth 3 (three) times daily with meals. For hypotension    [provider]  NARCAN 4 MG/0.1ML LIQD nasal spray kit Place 1 spray into the nose as needed (accidental overdose.). 10/10/19   [provider]  NON FORMULARY BIPAP at bedtime    [provider]  omega-3 fish oil (MAXEPA) 1000 MG CAPS capsule Take 1,000 mg by mouth daily.    [provider]  OXYGEN Inhale 2 L into the lungs continuous.    [provider]  pantoprazole (PROTONIX) 40 MG tablet Take 1 tablet (40 mg total) by mouth at bedtime. 06/18/21   Elgergawy, Silver Huguenin, MD  Pollen Extracts (PROSTAT PO) Take 30 mLs by mouth in the morning and at bedtime.    [provider]  pramipexole (MIRAPEX) 0.5 MG tablet Take 0.5 mg by mouth daily.  05/18/17   [provider]  rosuvastatin (CRESTOR) 40 MG tablet TAKE 1 TABLET BY MOUTH DAILY *PATIENT NEEDS APPOINTMENT* 12/08/20   Minus Breeding, MD  Sodium Phosphates (RA SALINE ENEMA RE) Place 1 Dose rectally as needed.    [provider]  zinc sulfate 50 MG CAPS capsule Take 220 mg by mouth daily.    [provider]   Current Facility-Administered Medications  Medication Dose Route Frequency Provider Last Rate Last Admin   dextrose 50 % solution            0.9 %  sodium chloride infusion   Intravenous Continuous Witman, Burnis Medin, MD       [MAR Hold] acetaminophen (TYLENOL) tablet 650 mg  650 mg Oral Q6H PRN Karmen Bongo, MD       Or   [facility] Hold] acetaminophen (TYLENOL) suppository 650 mg  650 mg Rectal Q6H PRN Karmen Bongo, MD       [facility] Hold] albuterol (PROVENTIL) (2.5 MG/3ML) 0.083% nebulizer solution 2.5 mg  2.5 mg Nebulization Q2H  PRN Karmen Bongo, MD       [facility] Hold] calcium carbonate (dosed in mg elemental calcium) suspension 500 mg of elemental calcium  500 mg of elemental calcium Oral Q6H PRN Karmen Bongo, MD       [facility] Hold] camphor-menthol [facility]) lotion 1 Application  1 Application Topical T6L PRN Karmen Bongo, MD       And   [facility] Hold] hydrOXYzine (ATARAX) tablet 25 mg  25 mg Oral Q8H PRN Karmen Bongo, MD       [START ON 07/21/2021] ceFEPIme (MAXIPIME) 1 g in sodium chloride 0.9 % 100 mL IVPB  1 g Intravenous Q24H Bertis Ruddy, RPH       ceFEPIme (MAXIPIME) 2 g in sodium chloride 0.9 % 100 mL IVPB  2 g Intravenous Once Bertis Ruddy, RPH       chlorhexidine (PERIDEX) 0.12 % solution 15 mL  15 mL Mouth/Throat Once Lidia Collum, MD       Or   Oral care mouth rinse  15 mL Mouth Rinse Once Lidia Collum, MD       chlorhexidine (PERIDEX) 0.12 % solution            [MAR Hold] Chlorhexidine Gluconate Cloth 2 % PADS 6 each  6 each Topical Q0600 Valentina Gu, NP       dextrose 50 % solution 25 g  25 g Intravenous STAT Lidia Collum, MD       [MAR Hold] docusate sodium (ENEMEEZ) enema 283 mg  1 enema Rectal PRN Karmen Bongo, MD       [facility] Hold] feeding supplement (NEPRO CARB STEADY) liquid 237 mL  237 mL Oral TID PRN Karmen Bongo, MD       [facility] Hold] heparin injection 5,000 Units  5,000 Units Subcutaneous Q8H Karmen Bongo, MD       [facility] Hold] hydrALAZINE (APRESOLINE) injection 5 mg  5 mg Intravenous Q4H PRN Karmen Bongo, MD       [facility] Hold] insulin aspart (novoLOG) injection 0-6 Units  0-6 Units Subcutaneous Q4H Karmen Bongo, MD       [MAR Hold] lactulose [facility]) enema 200 gm  300 mL Rectal Q6H PRN Karmen Bongo, MD       [facility] Hold] metroNIDAZOLE (FLAGYL) IVPB 500 mg  500 mg Intravenous Once Gareth Morgan, MD       [facility] Hold] ondansetron  [facility]) tablet 4 mg  4 mg Oral Q6H PRN Karmen Bongo, MD       Or   [facility] Hold] ondansetron [facility]) injection 4 mg  4 mg Intravenous Q6H PRN Karmen Bongo, MD       [facility] Hold] sodium chloride flush (NS) 0.9 % injection 3 mL  3 mL Intravenous Q12H Karmen Bongo, MD       [facility] Hold] sorbitol 70 % solution 30 mL  30 mL Oral PRN Karmen Bongo, MD       vancomycin (VANCOCIN) 2,500 mg in sodium chloride 0.9 % 500 mL IVPB  2,500 mg Intravenous Once Bertis Ruddy, [facility]       [START ON 07/22/2021] vancomycin (VANCOCIN) IVPB 1000 mg/200 mL premix  1,000 mg Intravenous Q M,W,F-HD Bertis Ruddy, [facility]       [facility] Hold] zolpidem (AMBIEN) tablet 5 mg  5 mg Oral QHS PRN Karmen Bongo, MD       Labs: Basic Metabolic Panel: Recent Labs  Lab 07/17/2021 0438 07/23/2021 0956  NA 139 139  K 3.8 3.6  CL 97*  --  CO2 28  --   GLUCOSE 84  --   BUN 44*  --   CREATININE 6.78*  --   CALCIUM 9.5  --    Liver Function Tests: Recent Labs  Lab 07/14/2021 0438  AST 15  ALT 16  ALKPHOS 188*  BILITOT 0.9  PROT 7.5  ALBUMIN 2.3*   No results for input(s): "LIPASE", "AMYLASE" in the last 168 hours. Recent Labs  Lab 07/28/2021 0438  AMMONIA 76*   CBC: Recent Labs  Lab 07/29/2021 0438 07/03/2021 0956  WBC 8.1  --   NEUTROABS 6.1  --   HGB 9.4* 10.2*  HCT 31.7* 30.0*  MCV 100.0  --   PLT 222  --    Cardiac Enzymes: No results for input(s): "CKTOTAL", "CKMB", "CKMBINDEX", "TROPONINI" in the last 168 hours. CBG: Recent Labs  Lab 07/26/2021 0421 07/03/2021 1358  GLUCAP 86 52*   Iron Studies: No results for input(s): "IRON", "TIBC", "TRANSFERRIN", "FERRITIN" in the last 72 hours. Studies/Results: CT Head Wo Contrast  Result Date: 07/26/2021 CLINICAL DATA:  56 year old female with altered mental status. Suspected small subdural hematoma along the falx last month. EXAM: CT HEAD WITHOUT CONTRAST TECHNIQUE: Contiguous axial images were obtained from the base of the skull through the vertex without  intravenous contrast. RADIATION DOSE REDUCTION: This exam was performed according to the departmental dose-optimization program which includes automated exposure control, adjustment of the mA and/or kV according to patient size and/or use of iterative reconstruction technique. COMPARISON:  07/05/2021 and earlier. FINDINGS: Brain: Unchanged small hyperdensity along the right posterior falx near the tentorial incisor a since last year, series 3, image 20. Doubt acute or chronic blood products. No midline shift, ventriculomegaly, mass effect, evidence of mass lesion, intracranial hemorrhage or evidence of cortically based acute infarction. Patchy bilateral white matter hypodensity. Stable gray-white matter differentiation throughout the brain. Generalized cerebral volume loss for age appears stable. Vascular: No suspicious intracranial vascular hyperdensity. Skull: No acute osseous abnormality identified. Sinuses/Orbits: Mild right mastoid effusion appears stable. Other Visualized paranasal sinuses and mastoids are stable and well aerated. Other: Generalized soft tissue swelling, edema suggesting anasarca, increased from earlier this month. Stable orbits soft tissues. IMPRESSION: 1. Small hyperdensity along the right posterior falx does not appear significantly changed since last year, doubtful for subdural blood. 2. No new intracranial abnormality. Stable generalized cerebral volume loss and patchy chronic white matter changes. 3. Generalized scalp edema suggesting anasarca. Electronically Signed   By: Genevie Ann M.D.   On: 07/30/2021 07:01    ROS: unable to obtain given her AMS  Physical Exam: Vitals:   07/08/2021 1030 07/21/2021 1045 07/24/2021 1215 07/09/2021 1300  BP: 115/71 (!) 106/57 115/68 (!) 108/58  Resp: 18 (!) 24 (!) 23   Temp:         General: adult female in stretcher somnolent Head: NCAT Neck: increased neck circumference Lungs: coarse transmitted upper airway sounds Heart: S1S2 no rub Abdomen:  soft/distended with obese habitus Lower extremities: left BKA with wound dehiscence; right leg with wounds as well; lower extremity edema bilaterally  Neuro: does not respond to questions or follow commands; difficult to arouse Dialysis Access:RUE AVF with bruit and thrill   Dialysis Orders: [facility]: [facility] MWF 4 hrs 180NRE 450/800 113 kg 2.0L/2.5 Ca AVF -Heparin 10000 units IV TIW  Will DC, History of SDH -Hectorol 9 mcg IV TIW -Parsabiv 7.5 mg IV TIW -no ESA   Assessment/Plan:  Dehiscence/Purulent drainage from L BKA. Has been taken to OR  for possible AKA per VVS. ABX per pharmacy.  Acute metabolic encephalopathy: Probably multifactorial related to polypharmacy, untreated OSA, probable infection of L stump. Per primary  ESRD -  MWF will have HD today on schedule. No heparin. K+ 3.8/   Hypertension/volume  - BP has been soft, on midodrine 10 mg PO TID. Will continue.   Anemia  - HGB 9.4 on admission. Not on ESA at OP center D/T concern for possible renal cell carcinoma. Follow HGB and transfuse if needed.   Metabolic bone disease -  Last OP PO4 low. Recheck with HD today. Corrected Calcium 10.6. Decrease hectorol dose. On Hermina Staggers which is not available on hospital formulary. Hold binders until PO4 results.   Nutrition - Albumin low. Protein supps, renal vitamins when able to eat. AFib on Eliquis - per primary  RA on humira   Rita H. Owens Shark, NP-C 07/08/2021, 2:04 PM  [employer] [phone]  Seen and examined independently.  Agree with note as documented above by physician extender and as noted here.  [name] is a pleasant female with a history of ESRD on HD MWF, diabetes, prior HTN, recent afib diagnosis who presented the hospital with abd pain.  Rec'd narcan in the ER.  She was found to have purulent drainage and wound dehiscence from her left BKA residual limb.  She was seen by VVS and they plan for revision of her wound today.    My personal  physical exam is listed above.  She was taken to pre-op in lieu of HD per charting and hasn't had HD yet.  Somnolent as above and she does not provide any history.  Spoke with pre-op staff/anesthesia.  # Wound dehiscence - left BKA  - for revision of left BKA today and may need AKA per vascular   # Acute Encephalopathy - multifactorial with untreated sleep apnea and concern for sedating meds.  Would avoid sedating medications. S/p narcan earlier per staff in pre-op.  I removed an ambien PRN  # ESRD  - still plan for HD tonight after surgery given her clinical status - she was taken to pre-op before she had HD.  If not hemodynamically stable after surgery will need to have CRRT given her mental status and degree of fluid overload with hypotension requiring midodrine  - If she doesn't ultimately end up on CRRT overnight would need serial HD  # Hypotension - has been on midodrine last admission and outpatient - continue here   # Anemia CKD  - hasn't been on ESA given concern for malignancy - renal and pulm lesion - Hb 07.6  # Metabolic bone disease  - update phos - corrected calcium elevated as above  # Atrial fibrillation  - per primary team   Disposition - continue inpatient monitoring. OR tonight.  Am concerned she will end up intubated.   Claudia Desanctis, MD 07/07/2021 5:58 PM

## 2021-07-20 NOTE — Progress Notes (Signed)
Pharmacy Antibiotic Note  [name] is a 56 y.o. female admitted on 07/13/2021 presenting with AMS, hx BKA, concern for sepsis.  Pharmacy has been consulted for vancomycin and cefepime dosing.  ESRD-HD usually MWF  Plan: Vancomycin 2500 mg IV x 1, then 1000 mg IV qHD Cefepime 2g IV x 1, then 1g IV q 24h Monitor iHD schedule, Cx and clinical progression to narrow Vancomycin random level as needed     Temp (24hrs), Avg:98.3 F (36.8 C), Min:98.3 F (36.8 C), Max:98.3 F (36.8 C)  Recent Labs  Lab 07/07/2021 0438  WBC 8.1  CREATININE 6.78*  LATICACIDVEN 1.3    CrCl cannot be calculated (Unknown ideal weight.).    Allergies  Allergen Reactions   Bupropion Itching   Dilaudid [Hydromorphone] Other (See Comments)    Apnea, required intubation    Bertis Ruddy, PharmD Clinical Pharmacist ED Pharmacist Phone # [phone] 07/04/2021 1:35 PM

## 2021-07-20 NOTE — Transfer of Care (Signed)
Immediate Anesthesia Transfer of Care Note  Patient: [name]  Procedure(s) Performed: REVISION AMPUTATION BELOW KNEE AND IRRIGATION AND DEBRIDEMENT, LEFT (Left: Knee)  Patient Location: ICU  Anesthesia Type:General  Level of Consciousness: Patient remains intubated per anesthesia plan  Airway & Oxygen Therapy: Patient remains intubated per anesthesia plan  Post-op Assessment: Report given to RN and Post -op Vital signs reviewed and stable  Post vital signs: Reviewed and stable  Last Vitals:  Vitals Value Taken Time  BP    Temp    Pulse 88 07/30/2021 1900  Resp 11 07/23/2021 1901  SpO2    Vitals shown include unvalidated device data.  Last Pain: There were no vitals filed for this visit.       Complications: No notable events documented.

## 2021-07-20 NOTE — Progress Notes (Signed)
Spoke with patient's husband Emyah Roznowski Sr. [phone]  He stated that the patient has not been taking eliquis or plavix at home since she was discharged from rehab.

## 2021-07-21 ENCOUNTER — Inpatient Hospital Stay (HOSPITAL_COMMUNITY): Payer: Medicare Other

## 2021-07-21 ENCOUNTER — Encounter (HOSPITAL_COMMUNITY): Payer: Self-pay | Admitting: Vascular Surgery

## 2021-07-21 DIAGNOSIS — G9341 Metabolic encephalopathy: Secondary | ICD-10-CM | POA: Diagnosis not present

## 2021-07-21 DIAGNOSIS — N186 End stage renal disease: Secondary | ICD-10-CM | POA: Diagnosis not present

## 2021-07-21 DIAGNOSIS — Z992 Dependence on renal dialysis: Secondary | ICD-10-CM | POA: Diagnosis not present

## 2021-07-21 LAB — CBC
HCT: 28.5 % — ABNORMAL LOW (ref 36.0–46.0)
Hemoglobin: 8.8 g/dL — ABNORMAL LOW (ref 12.0–15.0)
MCH: 29.7 pg (ref 26.0–34.0)
MCHC: 30.9 g/dL (ref 30.0–36.0)
MCV: 96.3 fL (ref 80.0–100.0)
Platelets: 243 10*3/uL (ref 150–400)
RBC: 2.96 MIL/uL — ABNORMAL LOW (ref 3.87–5.11)
RDW: 17.4 % — ABNORMAL HIGH (ref 11.5–15.5)
WBC: 10.5 10*3/uL (ref 4.0–10.5)
nRBC: 0 % (ref 0.0–0.2)

## 2021-07-21 LAB — MAGNESIUM: Magnesium: 2.1 mg/dL (ref 1.7–2.4)

## 2021-07-21 LAB — GLUCOSE, CAPILLARY
Glucose-Capillary: 103 mg/dL — ABNORMAL HIGH (ref 70–99)
Glucose-Capillary: 132 mg/dL — ABNORMAL HIGH (ref 70–99)
Glucose-Capillary: 139 mg/dL — ABNORMAL HIGH (ref 70–99)
Glucose-Capillary: 183 mg/dL — ABNORMAL HIGH (ref 70–99)
Glucose-Capillary: 50 mg/dL — ABNORMAL LOW (ref 70–99)
Glucose-Capillary: 61 mg/dL — ABNORMAL LOW (ref 70–99)
Glucose-Capillary: 67 mg/dL — ABNORMAL LOW (ref 70–99)
Glucose-Capillary: 67 mg/dL — ABNORMAL LOW (ref 70–99)
Glucose-Capillary: 76 mg/dL (ref 70–99)
Glucose-Capillary: 81 mg/dL (ref 70–99)

## 2021-07-21 LAB — BASIC METABOLIC PANEL
Anion gap: 13 (ref 5–15)
Anion gap: 13 (ref 5–15)
BUN: 50 mg/dL — ABNORMAL HIGH (ref 6–20)
BUN: 27 mg/dL — ABNORMAL HIGH (ref 6–20)
CO2: 24 mmol/L (ref 22–32)
CO2: 26 mmol/L (ref 22–32)
Calcium: 8.7 mg/dL — ABNORMAL LOW (ref 8.9–10.3)
Calcium: 8.5 mg/dL — ABNORMAL LOW (ref 8.9–10.3)
Chloride: 101 mmol/L (ref 98–111)
Chloride: 97 mmol/L — ABNORMAL LOW (ref 98–111)
Creatinine, Ser: 7.27 mg/dL — ABNORMAL HIGH (ref 0.44–1.00)
Creatinine, Ser: 4.66 mg/dL — ABNORMAL HIGH (ref 0.44–1.00)
GFR, Estimated: 6 mL/min — ABNORMAL LOW (ref >60)
GFR, Estimated: 10 mL/min — ABNORMAL LOW (ref >60)
Glucose, Bld: 88 mg/dL (ref 70–99)
Glucose, Bld: 146 mg/dL — ABNORMAL HIGH (ref 70–99)
Potassium: 3.6 mmol/L (ref 3.5–5.1)
Potassium: 4 mmol/L (ref 3.5–5.1)
Sodium: 138 mmol/L (ref 135–145)
Sodium: 136 mmol/L (ref 135–145)

## 2021-07-21 LAB — POCT I-STAT 7, (LYTES, BLD GAS, ICA,H+H)
Acid-Base Excess: 4 mmol/L — ABNORMAL HIGH (ref 0.0–2.0)
Acid-Base Excess: 5 mmol/L — ABNORMAL HIGH (ref 0.0–2.0)
Bicarbonate: 29.2 mmol/L — ABNORMAL HIGH (ref 20.0–28.0)
Bicarbonate: 30 mmol/L — ABNORMAL HIGH (ref 20.0–28.0)
Calcium, Ion: 1.13 mmol/L — ABNORMAL LOW (ref 1.15–1.40)
Calcium, Ion: 1.14 mmol/L — ABNORMAL LOW (ref 1.15–1.40)
HCT: 28 % — ABNORMAL LOW (ref 36.0–46.0)
HCT: 28 % — ABNORMAL LOW (ref 36.0–46.0)
Hemoglobin: 9.5 g/dL — ABNORMAL LOW (ref 12.0–15.0)
Hemoglobin: 9.5 g/dL — ABNORMAL LOW (ref 12.0–15.0)
O2 Saturation: 100 %
O2 Saturation: 99 %
Patient temperature: 98
Patient temperature: 98.6
Potassium: 3.6 mmol/L (ref 3.5–5.1)
Potassium: 3.6 mmol/L (ref 3.5–5.1)
Sodium: 139 mmol/L (ref 135–145)
Sodium: 139 mmol/L (ref 135–145)
TCO2: 31 mmol/L (ref 22–32)
TCO2: 31 mmol/L (ref 22–32)
pCO2 arterial: 44.4 mmHg (ref 32–48)
pCO2 arterial: 46.3 mmHg (ref 32–48)
pH, Arterial: 7.42 (ref 7.35–7.45)
pH, Arterial: 7.424 (ref 7.35–7.45)
pO2, Arterial: 128 mmHg — ABNORMAL HIGH (ref 83–108)
pO2, Arterial: 221 mmHg — ABNORMAL HIGH (ref 83–108)

## 2021-07-21 LAB — TRIGLYCERIDES: Triglycerides: 233 mg/dL — ABNORMAL HIGH (ref <150)

## 2021-07-21 LAB — PHOSPHORUS: Phosphorus: 3.7 mg/dL (ref 2.5–4.6)

## 2021-07-21 LAB — HEPATITIS C ANTIBODY: HCV Ab: NONREACTIVE

## 2021-07-21 LAB — HEPATITIS B CORE ANTIBODY, TOTAL: Hep B Core Total Ab: NONREACTIVE

## 2021-07-21 LAB — HEPATITIS B SURFACE ANTIGEN: Hepatitis B Surface Ag: NONREACTIVE

## 2021-07-21 LAB — AMMONIA: Ammonia: 41 umol/L — ABNORMAL HIGH (ref 9–35)

## 2021-07-21 LAB — HEPATITIS B SURFACE ANTIBODY,QUALITATIVE: Hep B S Ab: REACTIVE — AB

## 2021-07-21 MED ORDER — MIDODRINE HCL 5 MG PO TABS
10.0000 mg | ORAL_TABLET | Freq: Three times a day (TID) | ORAL | Status: DC
  Administered 2021-07-21 – 2021-07-24 (×10): 10 mg
  Filled 2021-07-21 (×10): qty 2

## 2021-07-21 MED ORDER — DEXTROSE 50 % IV SOLN
INTRAVENOUS | Status: AC
  Administered 2021-07-21: 12.5 g via INTRAVENOUS
  Filled 2021-07-21: qty 50

## 2021-07-21 MED ORDER — VITAL HIGH PROTEIN PO LIQD
1000.0000 mL | ORAL | Status: DC
  Administered 2021-07-21: 1000 mL

## 2021-07-21 MED ORDER — ALBUMIN HUMAN 25 % IV SOLN
25.0000 g | Freq: Once | INTRAVENOUS | Status: AC
  Administered 2021-07-21: 25 g via INTRAVENOUS
  Filled 2021-07-21: qty 100

## 2021-07-21 MED ORDER — VANCOMYCIN HCL IN DEXTROSE 1-5 GM/200ML-% IV SOLN
1000.0000 mg | INTRAVENOUS | Status: DC
  Filled 2021-07-21: qty 200

## 2021-07-21 MED ORDER — DEXTROSE 10 % IV SOLN: INTRAVENOUS | Status: DC

## 2021-07-21 MED ORDER — OSMOLITE 1.5 CAL PO LIQD
1000.0000 mL | ORAL | Status: DC
  Filled 2021-07-21: qty 1000

## 2021-07-21 MED ORDER — RENA-VITE PO TABS
1.0000 | ORAL_TABLET | Freq: Every day | ORAL | Status: DC
  Administered 2021-07-21 – 2021-07-24 (×4): 1
  Filled 2021-07-21 (×4): qty 1

## 2021-07-21 MED ORDER — FENTANYL CITRATE (PF) 100 MCG/2ML IJ SOLN: 50.0000 ug | Freq: Once | INTRAMUSCULAR | Status: DC

## 2021-07-21 MED ORDER — DEXTROSE 50 % IV SOLN: 12.5000 g | INTRAVENOUS | Status: AC

## 2021-07-21 MED ORDER — FENTANYL 2500MCG IN NS 250ML (10MCG/ML) PREMIX INFUSION
50.0000 ug/h | INTRAVENOUS | Status: DC
  Administered 2021-07-21 – 2021-07-22 (×2): 50 ug/h via INTRAVENOUS
  Filled 2021-07-21 (×2): qty 250

## 2021-07-21 MED ORDER — PANTOPRAZOLE 2 MG/ML SUSPENSION
40.0000 mg | Freq: Every day | ORAL | Status: DC
  Administered 2021-07-21 – 2021-07-24 (×4): 40 mg
  Filled 2021-07-21 (×5): qty 20

## 2021-07-21 MED ORDER — PROSOURCE TF PO LIQD
45.0000 mL | Freq: Three times a day (TID) | ORAL | Status: DC
  Administered 2021-07-21 – 2021-07-23 (×7): 45 mL
  Filled 2021-07-21 (×7): qty 45

## 2021-07-21 MED ORDER — OSMOLITE 1.5 CAL PO LIQD
1000.0000 mL | ORAL | Status: DC
  Administered 2021-07-21 – 2021-07-25 (×7): 1000 mL
  Filled 2021-07-21 (×8): qty 1000

## 2021-07-21 MED ORDER — DEXTROSE 50 % IV SOLN: 12.5000 g | Freq: Once | INTRAVENOUS | Status: AC

## 2021-07-21 MED ORDER — MAGNESIUM SULFATE 2 GM/50ML IV SOLN
2.0000 g | Freq: Once | INTRAVENOUS | Status: AC
  Administered 2021-07-21: 2 g via INTRAVENOUS
  Filled 2021-07-21: qty 50

## 2021-07-21 MED ORDER — HEPARIN SODIUM (PORCINE) 1000 UNIT/ML DIALYSIS
1000.0000 [IU] | INTRAMUSCULAR | Status: DC | PRN
  Administered 2021-07-21: 3000 [IU] via INTRAVENOUS_CENTRAL
  Filled 2021-07-21: qty 5
  Filled 2021-07-21: qty 6

## 2021-07-21 MED ORDER — DEXTROSE 50 % IV SOLN
12.5000 g | INTRAVENOUS | Status: AC
  Administered 2021-07-21: 12.5 g via INTRAVENOUS
  Filled 2021-07-21: qty 50

## 2021-07-21 MED ORDER — FENTANYL BOLUS VIA INFUSION
50.0000 ug | INTRAVENOUS | Status: DC | PRN
  Administered 2021-07-22 (×2): 50 ug via INTRAVENOUS
  Administered 2021-07-23: 75 ug via INTRAVENOUS
  Administered 2021-07-23: 100 ug via INTRAVENOUS

## 2021-07-21 MED ORDER — DEXTROSE 50 % IV SOLN
INTRAVENOUS | Status: AC
  Administered 2021-07-21: 25 g via INTRAVENOUS
  Filled 2021-07-21: qty 50

## 2021-07-21 MED ORDER — DEXMEDETOMIDINE HCL IN NACL 400 MCG/100ML IV SOLN
0.0000 ug/kg/h | INTRAVENOUS | Status: DC
  Administered 2021-07-21 – 2021-07-22 (×3): 0.4 ug/kg/h via INTRAVENOUS
  Administered 2021-07-22: 0.5 ug/kg/h via INTRAVENOUS
  Administered 2021-07-22: 0.4 ug/kg/h via INTRAVENOUS
  Administered 2021-07-23 (×4): 1.2 ug/kg/h via INTRAVENOUS
  Administered 2021-07-23 (×2): 0.7 ug/kg/h via INTRAVENOUS
  Administered 2021-07-24: 1 ug/kg/h via INTRAVENOUS
  Administered 2021-07-24 (×2): 1.2 ug/kg/h via INTRAVENOUS
  Filled 2021-07-21: qty 200
  Filled 2021-07-21 (×3): qty 100
  Filled 2021-07-21: qty 200
  Filled 2021-07-21 (×3): qty 100
  Filled 2021-07-21: qty 200
  Filled 2021-07-21: qty 100
  Filled 2021-07-21: qty 200
  Filled 2021-07-21: qty 100

## 2021-07-21 MED ORDER — VANCOMYCIN HCL IN DEXTROSE 1-5 GM/200ML-% IV SOLN
1000.0000 mg | INTRAVENOUS | Status: DC
  Administered 2021-07-21: 1000 mg via INTRAVENOUS
  Filled 2021-07-21 (×2): qty 200

## 2021-07-21 NOTE — ED Provider Notes (Signed)
  Physical Exam  BP 109/67   Pulse 91   Temp 98.5 F (36.9 C) (Oral)   Resp 16   Ht '5\' 7"'$  (1.702 m)   Wt 118.2 kg Comment: Post tx wt. 3.2  uf for 4 hrs .treatment.  LMP  (LMP Unknown) Comment: LMP Feb 2011  SpO2 100%   BMI 40.81 kg/m   Physical Exam  Procedures  Procedures  ED Course / MDM    Medical Decision Making Amount and/or Complexity of Data Reviewed Labs: ordered. Radiology: ordered.  Risk Prescription drug management. Decision regarding hospitalization.   Received care of patient from Dr. Stark Jock at 730AM. Please see his note for prior history, physical and care.  Briefly, this is a 56yo female with complicated medical history including recent admissions with encephalopathy, polypharmacy, ESRD, afib on eliquis, recent BKA 5/2.  No fever or leukocytosis.  Nursing unable to obtain pulse ox, obtained ABG with saturation 90%.  She was given narcan x2 without significant change in mental status. She is protecting her airway. CT head without significant abnormalities.  Admitted for further care.  Dr. Lorin Mercy discussed with family and vascular surgery who is concerned for BKA infection and she will go to the OR today for revision. Ordered empiric abx.        Gareth Morgan, MD 07/21/21 [phone]

## 2021-07-21 NOTE — Progress Notes (Addendum)
  Progress Note    07/21/2021 7:58 AM 1 Day Post-Op  Subjective:  intubated and sedated   Vitals:   07/21/21 0734 07/21/21 0745  BP:  92/67  Pulse: 93 87  Resp: 16 16  Temp:    SpO2: 100% 100%   Physical Exam: Cardiac:  regular Lungs:  non labored Incisions:  Left BKA min incision packing removed. Some purulence within wound bed. Wound re packed with betadine soaked kerlix and then 4 x4s and ACE applied Extremities:  RLE wounds dressed Neurologic: sedated, did withdraw left BKA to pain  CBC    Component Value Date/Time   WBC 10.5 07/21/2021 0358   RBC 2.96 (L) 07/21/2021 0358   HGB 9.5 (L) 07/21/2021 0414   HGB 9.8 (L) 03/27/2021 1113   HGB 8.1 (L) 09/18/2018 1227   HCT 28.0 (L) 07/21/2021 0414   HCT 39.5 06/01/2017 0959   PLT 243 07/21/2021 0358   PLT 228 03/27/2021 1113   PLT 238 06/01/2017 0959   MCV 96.3 07/21/2021 0358   MCV 90 06/01/2017 0959   MCH 29.7 07/21/2021 0358   MCHC 30.9 07/21/2021 0358   RDW 17.4 (H) 07/21/2021 0358   RDW 18.4 (H) 06/01/2017 0959   LYMPHSABS 1.0 07/28/2021 0438   MONOABS 0.5 07/22/2021 0438   EOSABS 0.4 07/07/2021 0438   BASOSABS 0.0 07/19/2021 0438    BMET    Component Value Date/Time   NA 139 07/21/2021 0414   NA 135 06/01/2017 0959   K 3.6 07/21/2021 0414   CL 101 07/21/2021 0358   CO2 24 07/21/2021 0358   GLUCOSE 88 07/21/2021 0358   BUN 50 (H) 07/21/2021 0358   BUN 27 (H) 06/01/2017 0959   CREATININE 7.27 (H) 07/21/2021 0358   CREATININE 2.96 (H) 03/27/2021 1113   CREATININE 1.96 (H) 07/19/2011 1201   CALCIUM 8.7 (L) 07/21/2021 0358   GFRNONAA 6 (L) 07/21/2021 0358   GFRNONAA 18 (L) 03/27/2021 1113   GFRAA 7 (L) 07/16/2019 1152    INR    Component Value Date/Time   INR 1.7 (H) 06/15/2021 0036     Intake/Output Summary (Last 24 hours) at 07/21/2021 0758 Last data filed at 07/21/2021 0400 Gross per 24 hour  Intake 1769.46 ml  Output 50 ml  Net 1719.46 ml     Assessment/Plan:  56 y.o. female is s/p  Incision and drainage of left below knee amputation stump 1 Day Post-Op   Packing changed this morning to left BKA. Betadine soaked Kerlix repacked into wound There was some purulence in wound bed observed Leukocytosis 10.5 slightly up, likely reactive but will monitor Wound cultures pending, few GPC present On Cefepime, Metronidazole and VANC Will continue daily packing changes  Karoline Caldwell, PA-C [facility] [phone] 07/21/2021 7:58 AM  I have seen and evaluated the patient. I agree with the PA note as documented above. Will continue wet to dry to BKA for now given purulence in wound.    Marty Heck, MD [facility] [place] Office: [phone]

## 2021-07-21 NOTE — Progress Notes (Signed)
[facility] Progress Note  Name: [name] MRN: [id number] DOB: 04/09/1965   Subjective:  She was ultimately taken to the OR for left BKA debridement.  She was intubated and remains intubated.  She was started on HD early this AM.  She was on phenylephrine 40 mcg/min prior to HD and this was increased to 80 mcg/min with the procedure.  HD RN at bedside.  Procedure supervised.  104/68 and HR 95.    Review of systems:  Unable to obtain secondary to intubated  ------------------------ Background on consult:  HPI: [name] is a 56 y.o. female with ESRD on hemodialysis MWF at [facility]. 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 Recent admission 05/15-05/18/2023 for toxic encephalopathy with possible opioid overuse, possible 4 mm subdural hematoma seen on CT. She was discharged to SNF but is now coming from home. She was brought to ED this AM with C/O abdominal pain. Apparently was awake at home but since arrival to ED has been altered. SCr 6.78 BUN 44 CO2 28. WBC 8.1 HGB 9.4 Lactic acid 1.3. She has NOT missed HD, did have extra treatment 07/16/2021. She did not respond to narcan given in ED however she was awake and responsive when talking to [facility] PA. She was seen by [facility] for C/O pain in L stump. Mild dehiscence, copious purulent drainage noted.    Intake/Output Summary (Last 24 hours) at 07/21/2021 0633 Last data filed at 07/21/2021 0400 Gross per 24 hour  Intake 1769.46 ml  Output 50 ml  Net 1719.46 ml    Vitals:  Vitals:   07/21/21 0545 07/21/21 0600 07/21/21 0615 07/21/21 0630  BP: 132/78 128/77 136/82 136/79  Pulse: 84 89 84 88  Resp: '16 16 16 16  '$ Temp:      TempSrc:      SpO2: 100% 100% 100% 100%  Weight:      Height:         Physical Exam:  General: adult female in bed intubated  Head: NCAT Neck: increased neck circumference Lungs: coarse  transmitted upper airway sounds FIO2 40 and PEEP 8 Heart: S1S2 no rub Abdomen: soft/distended with obese habitus Lower extremities: left BKA wrapped; right leg is wrapped with gauze Neuro: sedation is currently running Dialysis Access:RUE AVF in use   Medications reviewed   Labs:     Latest Ref Rng & Units 07/21/2021    4:14 AM 07/21/2021    3:58 AM 07/21/2021   12:46 AM  BMP  Glucose 70 - 99 mg/dL  88    BUN 6 - 20 mg/dL  50    Creatinine 0.44 - 1.00 mg/dL  7.27    Sodium 135 - 145 mmol/L 139  138  139   Potassium 3.5 - 5.1 mmol/L 3.6  3.6  3.6   Chloride 98 - 111 mmol/L  101    CO2 22 - 32 mmol/L  24    Calcium 8.9 - 10.3 mg/dL  8.7     Dialysis Orders: [facility]: [facility] MWF 4 hrs 180NRE 450/800 113 kg 2.0L/2.5 Ca AVF -Hectorol 9 mcg IV TIW -Parsabiv 7.5 mg IV TIW -no ESA   Assessment/Plan:  # Wound dehiscence - left BKA  - s/p I&D of left BKA today    # Acute Encephalopathy - multifactorial with untreated sleep apnea and concern for sedating meds.  Would avoid sedating medications.     #  ESRD  - HD on 6/20 off schedule.  She is currently on HD and will finish the procedure.   - Will transition to CRRT tomorrow to optimize fluid removal given her mental status and degree of fluid overload accompanied by hypotension.  I spoke with bedside RN and charge RN to let them know of the plan.  Will discuss with primary team as well   # Hypotension - on phenylephrine  - had been recently started on midodrine last admission and outpatient     # Anemia CKD  - hasn't been on ESA given concern for malignancy - renal and pulm lesions - Hb 9.5   # Metabolic bone disease  - phos acceptable.  On hectorol at reduced dose - corrected calcium up on admission.  Binders on hold and starting CRRT tomorrow so would continue to hold    # Atrial fibrillation  - per primary team    Disposition - continue inpatient monitoring.  Plan for CRRT on 6/21  Claudia Desanctis, MD 07/21/2021 6:57  AM

## 2021-07-21 NOTE — Procedures (Signed)
Central Venous Catheter Insertion Procedure Note  CHARRIE MCCONNON  [id number]  31-Jul-1965  Date:07/21/21  Time:11:34 AM   Provider Performing:Kamayah Pillay Chauncey Cruel Iona Beard   Procedure: Insertion of Non-tunneled Central Venous Catheter(36556)with US guidance (35009)    Indication(s) Hemodialysis  Consent Risks of the procedure as well as the alternatives and risks of each were explained to the patient and/or caregiver.  Consent for the procedure was obtained and is signed in the bedside chart  Anesthesia Topical only with 1% lidocaine   Timeout Verified patient identification, verified procedure, site/side was marked, verified correct patient position, special equipment/implants available, medications/allergies/relevant history reviewed, required imaging and test results available.  Sterile Technique Maximal sterile technique including full sterile barrier drape, hand hygiene, sterile gown, sterile gloves, mask, hair covering, sterile ultrasound probe cover (if used).  Procedure Description Area of catheter insertion was cleaned with chlorhexidine and draped in sterile fashion.   With real-time ultrasound guidance a HD catheter was placed into the left internal jugular vein.  Nonpulsatile blood flow and easy flushing noted in all ports.  The catheter was sutured in place and sterile dressing applied.     Complications/Tolerance None; patient tolerated the procedure well. Chest X-ray is ordered to verify placement for internal jugular or subclavian cannulation.  Chest x-ray is not ordered for femoral cannulation.  EBL Minimal  Specimen(s) None  Redmond School., MSN, APRN, AGACNP-BC [facility]  07/21/2021 , 11:35 AM  Please see [email or web address] for pager details  If no response, please call [phone] After hours, please call Elink at [phone]

## 2021-07-21 NOTE — Progress Notes (Signed)
Pt receives out-pt HD at [facility] on MWF. Pt arrives at 10:35 for 10:55 chair time. Will assist as needed.   Melven Sartorius Renal Navigator [phone]

## 2021-07-21 NOTE — Progress Notes (Signed)
RN able to get Spo2 reading a forehead probe. Pt Spo2 was reading 55-65% with appropriate pleth. RN checked ABG to assess correlation from ABG Spo2 to forehead probe. Results on ABG read an Spo2 of 99%.

## 2021-07-21 NOTE — Progress Notes (Signed)
NAME:  MACKENZIE GROOM, MRN:  [id number], DOB:  [birth year], LOS: 1 ADMISSION DATE:  07/27/2021, CONSULTATION DATE:  07/14/2021 REFERRING MD:  Dr. Lorin Mercy, CHIEF COMPLAINT: hypotension/ AMS   History of Present Illness:   56 year old female with PMH significant for but not limited to ESRD- MWF, DMT2, Afib on Eliquis, OSA not on CPAP, CAD, mitral regurgitation, HLD, HTN, HFpEF, anemia, PVD with L BKA, RA, neuropathy, anxiety, and HA who presented to ER today from home with complaints of altered mental status.    Recently underwent prior L BKA on 06/02/21 by Dr. Virl Cagey due to left foot wound with unreconstructable vascular disease.    Recent admit 5/15- 5/18 for toxic encephalopathy felt related to polypharmacy.   She was due for dialysis today but presented here for reasons above.  She has not missed iHD, actually received extra treatment 6/15.    Presented to OR altered and minimally responsive, felt possibly due to polypharmacy, not improved after narcan.   DL CVL placed in ER.  Left intubated due to hemodynamic instability during case, required Neo-Synephrine  Pertinent  Medical History  ESRD- MWF, DMT2, OSA not on CPAP, afib on Eliquis, CAD, mitral regurgitation, HLD, HTN, HFpEF, anemia, PVD with prior L BKA (06/02/21 by Dr. Virl Cagey) RA, neuropathy, anxiety, HA  Significant Hospital Events: Including procedures, antibiotic start and stop dates in addition to other pertinent events   6/19 I&D of left below-knee amputation stump , wound measured 2 x 1.5 x 5 cm deep, old hematoma removed 6/19 head CT stable cerebral atrophy, small hyperdensity along right posterior falx unchanged from last year  Interim History / Subjective:  Tmax 98 Wound cultures pending, MRSA PCR + BG 52-100 +1.7L admission  80 neo, 45 propofol  Intubated/sedated  Unable to obtain subjective evaluation due to patient status  Objective   Blood pressure 92/67, pulse 87, temperature 98 F (36.7 C), temperature  source Axillary, resp. rate 16, height '5\' 7"'$  (1.702 m), weight 120.8 kg, SpO2 100 %.    Vent Mode: PRVC FiO2 (%):  [40 %-100 %] 40 % Set Rate:  [16 bmp] 16 bmp Vt Set:  [500 mL] 500 mL PEEP:  [5 cmH20-8 cmH20] 8 cmH20 Plateau Pressure:  [23 cmH20-28 cmH20] 25 cmH20   Intake/Output Summary (Last 24 hours) at 07/21/2021 0805 Last data filed at 07/21/2021 0400 Gross per 24 hour  Intake 1769.46 ml  Output 50 ml  Net 1719.46 ml   Filed Weights   07/27/2021 1950 07/21/21 0336 07/21/21 0447  Weight: 120.8 kg 120.8 kg 120.8 kg    Examination: General: In bed, NAD, chronically ill-appearing HEENT: MM pink/moist, anicteric, atraumatic Neuro: RASS -3, PERRL 78m, sedated CV: S1S2, Afib, no m/r/g appreciated PULM: Moving all lobes, trachea midline, chest expansion symmetric GI: soft, bsx4 active, non-tender   Extremities: warm/dry, no pretibial edema, capillary refill less than 3 seconds, left AKA Skin: L AKA site with minimal drainage, no rashes or lesions noted    Ammonia 76>41 Creat 6.7>7.27, BUBN 44>50 HGB 9.5>8.8 7.42/44.4/128/29.2 Lactate 1.2  Resolved Hospital Problem list     Assessment & Plan:  Septic shock, secondary to infected left stump hematoma, status post I&D -Goal MAP 65.  On phenylephrine.  Titrate to goal -Continue cefepime and vancomycin, follow-up wound cultures/blood cultures  Acute respiratory failure, post-operative Left intubated post procedure due to unresponsiveness and volume overload -LTVV strategy with tidal volumes of 4-8 cc/kg ideal body weight -Goal plateau pressures less than 30 and driving  pressures less than 15 -Wean PEEP/FiO2 for SpO2 92-98% -VAP bundle -Daily SAT and SBT -PAD bundle with Propofol gtt and fentanyl push -RASS goal 0 to -1 -Follow intermittent CXR and ABG PRN  ESRD on HD (MWF) Undergoing HD currently -Nephrology consulted appreciate assistance -Nephrology input noted on plan for CRRT.  We will plan to place HD cath today  at bedside.  Acute metabolic encephalopathy Suspect secondary to sepsis and hypercarbia -Continue lactulose, evaluate for discontinuation post extubation -Daily wake-up assessments  Chronic atrial fibrillation on Eliquis Patient may have not been taking Eliquis post discharge from rehab.  CHA2DS2-VASc score of 5. -Continue holding Eliquis per Dr. Carlis Abbott -Continuous telemetry -Status post I&D with debridement, holding on heparin drip at this time. Will discuss with Dr. Loanne Drilling and [facility]. -Rate controlled.  Hold metoprolol until blood pressure improves  4MM SDH on CT head 5/15 Eliquis resumed on 5/18 and plan to be continued on discharge, unclear if patient was taking Eliquis after discharge from rehab.  Head CT on 6/19 per radiology with no new intracranial abnormality compared to previous scans. -Continue make-up assessments -Holding anticoagulation at this time  CAD History pulmonary hypertension -Obtain enteral access, resume Plavix -Continue telemetry  Chronic hypotension Currently on vasopressors -Resume midodrine when p.o. access obtained  DM2 Hypoglycemia, BG 52-100 -on D10 at 30, stop when TF started -As needed dextrose for hypoglycemia -Blood Glucose goal 140-180. -SSI -Lantus held at this time while n.p.o. -Start tube feeding once enteral access obtained  Best Practice (right click and "Reselect all SmartList Selections" daily)   Diet/type: NPO DVT prophylaxis: prophylactic heparin  GI prophylaxis: PPI Lines: Central line, Arterial Line, and yes and it is still needed Foley:  N/A Code Status:  full code Last date of multidisciplinary goals of care discussion [NA] son updated  Critical care time: 8 minutes    Redmond School., MSN, APRN, AGACNP-BC [facility]  07/21/2021 , 8:06 AM  Please see [email or web address] for pager details  If no response, please call [phone] After hours, please call Elink at [phone]

## 2021-07-21 NOTE — Progress Notes (Addendum)
Initial Nutrition Assessment  DOCUMENTATION CODES:   Morbid obesity  INTERVENTION:   Initiate tube feeding via OG tube: Osmolite 1.5 at 20 ml/h, increase by 10 ml every 4 hours to goal rate of 60 ml/h (1440 ml per day) Prosource TF 45 ml TID  Provides 2280 kcal, 123 gm protein, 1097 ml free water daily.  Renal MVI daily via tube.   NUTRITION DIAGNOSIS:   Increased nutrient needs related to wound healing, chronic illness (ESRD on HD) as evidenced by estimated needs.  GOAL:   Patient will meet greater than or equal to 90% of their needs  MONITOR:   Vent status, TF tolerance, Skin, Labs, I & O's  REASON FOR ASSESSMENT:   Ventilator, Consult Enteral/tube feeding initiation and management  ASSESSMENT:   56 yo female admitted with hypotension, AMS, L BKA wound dehiscence. PMH includes ESRD on HD, DM-2, A fib, OSA, CAD, mitral regurgitation, HLD, HTN, CHF, anemia, PVD, L BKA (06/02/21), RA, neuropathy, anxiety.  6/19 - I&D of L BKA stump. Remains intubated since procedure.    Received MD Consult for TF initiation and management. OG tube in place with tip in the stomach per x-ray. Stomach is diffusely distended.   Labs reviewed. K and phos WNL CBG: 81-67-61  Medications reviewed and include Colace, Novolog, Miralax, phenylephrine. Propofol currently off.  IVF: D10 at 30 ml/h.  Weight history reviewed.  Usual weight 110-120 kg prior to amputation EDW unknown S/P HD this morning with 3.2 L UF  NUTRITION - FOCUSED PHYSICAL EXAM:  Flowsheet Row Most Recent Value  Orbital Region No depletion  Upper Arm Region No depletion  Thoracic and Lumbar Region No depletion  Buccal Region Unable to assess  Temple Region No depletion  Clavicle Bone Region No depletion  Clavicle and Acromion Bone Region No depletion  Scapular Bone Region Unable to assess  Dorsal Hand Unable to assess  Patellar Region No depletion  Anterior Thigh Region No depletion  Posterior Calf Region No  depletion  Edema (RD Assessment) Mild  Hair Reviewed  Eyes Unable to assess  Mouth Unable to assess  Skin Reviewed  Nails Reviewed       Diet Order:   Diet Order             Diet NPO time specified  Diet effective now                   EDUCATION NEEDS:   Not appropriate for education at this time  Skin:  Skin Assessment: Skin Integrity Issues: Skin Integrity Issues:: Other (Comment) Other: pretibial venous stasis ulcer x 2; L leg BKA site S/P I&D  Last BM:  6/19  Height:   Ht Readings from Last 1 Encounters:  07/23/2021 '5\' 7"'$  (8.841 m)    Weight:   Wt Readings from Last 1 Encounters:  07/21/21 118.2 kg    Ideal Body Weight:  57.4 kg (adjusted for BKA)  BMI:  Body mass index is 44.6 kg/m (adjusted for BKA)  Estimated Nutritional Needs:   Kcal:  2200-2400  Protein:  115-130 gm  Fluid:  1 L + UOP    Lucas Mallow RD, LDN, CNSC Please refer to Amion for contact information.

## 2021-07-22 DIAGNOSIS — G9341 Metabolic encephalopathy: Secondary | ICD-10-CM | POA: Diagnosis not present

## 2021-07-22 DIAGNOSIS — T8740 Infection of amputation stump, unspecified extremity: Secondary | ICD-10-CM

## 2021-07-22 DIAGNOSIS — A419 Sepsis, unspecified organism: Secondary | ICD-10-CM | POA: Diagnosis not present

## 2021-07-22 DIAGNOSIS — J9601 Acute respiratory failure with hypoxia: Secondary | ICD-10-CM

## 2021-07-22 LAB — GLUCOSE, CAPILLARY
Glucose-Capillary: 230 mg/dL — ABNORMAL HIGH (ref 70–99)
Glucose-Capillary: 242 mg/dL — ABNORMAL HIGH (ref 70–99)
Glucose-Capillary: 278 mg/dL — ABNORMAL HIGH (ref 70–99)
Glucose-Capillary: 312 mg/dL — ABNORMAL HIGH (ref 70–99)
Glucose-Capillary: 197 mg/dL — ABNORMAL HIGH (ref 70–99)
Glucose-Capillary: 200 mg/dL — ABNORMAL HIGH (ref 70–99)

## 2021-07-22 LAB — RENAL FUNCTION PANEL
Albumin: 2.1 g/dL — ABNORMAL LOW (ref 3.5–5.0)
Anion gap: 12 (ref 5–15)
BUN: 32 mg/dL — ABNORMAL HIGH (ref 6–20)
CO2: 24 mmol/L (ref 22–32)
Calcium: 8.1 mg/dL — ABNORMAL LOW (ref 8.9–10.3)
Chloride: 97 mmol/L — ABNORMAL LOW (ref 98–111)
Creatinine, Ser: 4.56 mg/dL — ABNORMAL HIGH (ref 0.44–1.00)
GFR, Estimated: 11 mL/min — ABNORMAL LOW (ref >60)
Glucose, Bld: 329 mg/dL — ABNORMAL HIGH (ref 70–99)
Phosphorus: 3 mg/dL (ref 2.5–4.6)
Potassium: 4 mmol/L (ref 3.5–5.1)
Sodium: 133 mmol/L — ABNORMAL LOW (ref 135–145)

## 2021-07-22 LAB — BASIC METABOLIC PANEL
Anion gap: 14 (ref 5–15)
BUN: 35 mg/dL — ABNORMAL HIGH (ref 6–20)
CO2: 24 mmol/L (ref 22–32)
Calcium: 8.5 mg/dL — ABNORMAL LOW (ref 8.9–10.3)
Chloride: 97 mmol/L — ABNORMAL LOW (ref 98–111)
Creatinine, Ser: 5.15 mg/dL — ABNORMAL HIGH (ref 0.44–1.00)
GFR, Estimated: 9 mL/min — ABNORMAL LOW (ref >60)
Glucose, Bld: 227 mg/dL — ABNORMAL HIGH (ref 70–99)
Potassium: 4.1 mmol/L (ref 3.5–5.1)
Sodium: 135 mmol/L (ref 135–145)

## 2021-07-22 LAB — PHOSPHORUS: Phosphorus: 3.5 mg/dL (ref 2.5–4.6)

## 2021-07-22 LAB — CBC
HCT: 30.5 % — ABNORMAL LOW (ref 36.0–46.0)
Hemoglobin: 9.5 g/dL — ABNORMAL LOW (ref 12.0–15.0)
MCH: 29.3 pg (ref 26.0–34.0)
MCHC: 31.1 g/dL (ref 30.0–36.0)
MCV: 94.1 fL (ref 80.0–100.0)
Platelets: 246 10*3/uL (ref 150–400)
RBC: 3.24 MIL/uL — ABNORMAL LOW (ref 3.87–5.11)
RDW: 17.2 % — ABNORMAL HIGH (ref 11.5–15.5)
WBC: 9.5 10*3/uL (ref 4.0–10.5)
nRBC: 0.5 % — ABNORMAL HIGH (ref 0.0–0.2)

## 2021-07-22 LAB — MAGNESIUM: Magnesium: 2.2 mg/dL (ref 1.7–2.4)

## 2021-07-22 LAB — HEPATITIS B SURFACE ANTIBODY, QUANTITATIVE: Hep B S AB Quant (Post): 22.2 m[IU]/mL (ref >9.9)

## 2021-07-22 LAB — HEPARIN LEVEL (UNFRACTIONATED): Heparin Unfractionated: 0.3 IU/mL (ref 0.30–0.70)

## 2021-07-22 LAB — APTT: aPTT: 99 seconds — ABNORMAL HIGH (ref 24–36)

## 2021-07-22 MED ORDER — PRISMASOL BGK 4/2.5 32-4-2.5 MEQ/L EC SOLN
Status: DC
  Filled 2021-07-22 (×36): qty 5000

## 2021-07-22 MED ORDER — INSULIN ASPART 100 UNIT/ML IJ SOLN
0.0000 [IU] | INTRAMUSCULAR | Status: DC
  Administered 2021-07-22 (×2): 4 [IU] via SUBCUTANEOUS
  Administered 2021-07-22: 15 [IU] via SUBCUTANEOUS
  Administered 2021-07-23: 11 [IU] via SUBCUTANEOUS
  Administered 2021-07-23: 4 [IU] via SUBCUTANEOUS

## 2021-07-22 MED ORDER — SODIUM CHLORIDE 0.9 % IV SOLN: INTRAVENOUS | Status: DC | PRN

## 2021-07-22 MED ORDER — SODIUM CHLORIDE 0.9 % IV SOLN
500.0000 [IU]/h | INTRAVENOUS | Status: DC
  Administered 2021-07-22: 500 [IU]/h via INTRAVENOUS_CENTRAL
  Filled 2021-07-22: qty 10000

## 2021-07-22 MED ORDER — NOREPINEPHRINE 16 MG/250ML-% IV SOLN
0.0000 ug/min | INTRAVENOUS | Status: DC
  Administered 2021-07-22: 2 ug/min via INTRAVENOUS
  Filled 2021-07-22: qty 250

## 2021-07-22 MED ORDER — HEPARIN (PORCINE) 25000 UT/250ML-% IV SOLN
2300.0000 [IU]/h | INTRAVENOUS | Status: DC
  Administered 2021-07-22 – 2021-07-23 (×4): 2500 [IU]/h via INTRAVENOUS
  Administered 2021-07-24: 2300 [IU]/h via INTRAVENOUS
  Administered 2021-07-24: 2400 [IU]/h via INTRAVENOUS
  Administered 2021-07-25: 2300 [IU]/h via INTRAVENOUS
  Filled 2021-07-22 (×7): qty 250

## 2021-07-22 MED ORDER — INSULIN ASPART 100 UNIT/ML IJ SOLN
3.0000 [IU] | INTRAMUSCULAR | Status: DC
  Administered 2021-07-22 – 2021-07-24 (×11): 3 [IU] via SUBCUTANEOUS

## 2021-07-22 MED ORDER — PRISMASOL BGK 4/2.5 32-4-2.5 MEQ/L REPLACEMENT SOLN
Status: DC
  Filled 2021-07-22 (×8): qty 5000

## 2021-07-22 MED ORDER — SODIUM CHLORIDE 0.9 % IV SOLN
2.0000 g | Freq: Two times a day (BID) | INTRAVENOUS | Status: DC
Start: 2021-07-22 — End: 2021-07-25
  Administered 2021-07-22 – 2021-07-25 (×6): 2 g via INTRAVENOUS
  Filled 2021-07-22 (×6): qty 12.5

## 2021-07-22 MED ORDER — VANCOMYCIN HCL 1250 MG/250ML IV SOLN
1250.0000 mg | INTRAVENOUS | Status: DC
  Administered 2021-07-22 – 2021-07-24 (×3): 1250 mg via INTRAVENOUS
  Filled 2021-07-22 (×4): qty 250

## 2021-07-22 MED ORDER — HEPARIN SODIUM (PORCINE) 1000 UNIT/ML DIALYSIS: 1000.0000 [IU] | INTRAMUSCULAR | Status: DC | PRN

## 2021-07-22 NOTE — Progress Notes (Signed)
Pharmacy Antibiotic Note  [name] is a 56 y.o. female admitted on 07/27/2021 presenting with AMS and stump pain from previous L AKA.  Pharmacy has been consulted for vancomycin and cefepime dosing for sepsis and stump infection.  S/p I&D and stump abscess cultures are growing MRSA and GNR.  Patient has ESRD on HD MWF, but now transitioned to CRRT.  Afebrile, WBC WNL  Plan: Change vanc to '1250mg'$  IV Q24H Change cefepime to 2gm IV Q12H Monitor CRRT tolerance/interruption, micro data, vanc level as indicated   Height: '5\' 7"'$  (170.2 cm) Weight: 117.2 kg (258 lb 6.1 oz) IBW/kg (Calculated) : 61.6  Temp (24hrs), Avg:99.2 F (37.3 C), Min:98.8 F (37.1 C), Max:99.5 F (37.5 C)  Recent Labs  Lab 07/21/2021 0438 07/18/2021 1948 07/21/21 0358 07/21/21 1556 07/22/21 0354  WBC 8.1  --  10.5  --  9.5  CREATININE 6.78*  --  7.27* 4.66* 5.15*  LATICACIDVEN 1.3 1.2  --   --   --      Estimated Creatinine Clearance: 16.1 mL/min (A) (by C-G formula based on SCr of 5.15 mg/dL (H)).    Allergies  Allergen Reactions   Bupropion Itching   Dilaudid [Hydromorphone] Other (See Comments)    Apnea, required intubation   Vanc 6/19 >> Cefepime 6/19 >>   6/19 surgical PCR - positive MRSA/MSSA 6/19 L BKA abscess - Staph aureus and GNR, preliminary 6/19 Stump abscess - MRSA 6/19 BCx - NGTD  Jacques Willingham D. Mina Marble, PharmD, BCPS, [facility] 07/22/2021, 11:10 AM

## 2021-07-22 NOTE — Progress Notes (Signed)
NAME:  [name], MRN:  [id number], DOB:  Jun 14, 1965, LOS: 2 ADMISSION DATE:  07/09/2021, CONSULTATION DATE:  07/27/2021 REFERRING MD:  Dr. Lorin Mercy, CHIEF COMPLAINT: hypotension/ AMS   History of Present Illness:   56 year old female with PMH significant for but not limited to ESRD- MWF, DMT2, Afib on Eliquis, OSA not on CPAP, CAD, mitral regurgitation, HLD, HTN, HFpEF, anemia, PVD with L BKA, RA, neuropathy, anxiety, and HA who presented to ER today from home with complaints of altered mental status.    Recently underwent prior L BKA on 06/02/21 by Dr. Virl Cagey due to left foot wound with unreconstructable vascular disease.    Recent admit 5/15- 5/18 for toxic encephalopathy felt related to polypharmacy.   She was due for dialysis today but presented here for reasons above.  She has not missed iHD, actually received extra treatment 6/15.    Presented to OR altered and minimally responsive, felt possibly due to polypharmacy, not improved after narcan.   DL CVL placed in ER.  Left intubated due to hemodynamic instability during case, required Neo-Synephrine  Pertinent  Medical History  ESRD- MWF, DMT2, OSA not on CPAP, afib on Eliquis, CAD, mitral regurgitation, HLD, HTN, HFpEF, anemia, PVD with prior L BKA (06/02/21 by Dr. Virl Cagey) RA, neuropathy, anxiety, HA  Significant Hospital Events: Including procedures, antibiotic start and stop dates in addition to other pertinent events   6/19 I&D of left below-knee amputation stump , wound measured 2 x 1.5 x 5 cm deep, old hematoma removed 6/19 head CT stable cerebral atrophy, small hyperdensity along right posterior falx unchanged from last year 6/20 Vanc, Cefe, wound culture, blood culture 6/21 Started CRRT  Interim History / Subjective:  No overnight events, starting CRRT today  Tmax 99.5  X1 stool  BC pending, wound cultures S.Areus, recultured,   3.1L removed in HD, +548 past 24, +2.2L admit  40 neo, 62mg fent, 0.4 dex  Unable  to obtain subjective evaluation due to patient status  Objective   Blood pressure 109/67, pulse 82, temperature 99.3 F (37.4 C), temperature source Oral, resp. rate 16, height '5\' 7"'$  (1.702 m), weight 117.2 kg, SpO2 96 %.    Vent Mode: PRVC FiO2 (%):  [40 %] 40 % Set Rate:  [16 bmp] 16 bmp Vt Set:  [500 mL] 500 mL PEEP:  [5 cmH20-8 cmH20] 5 cmH20 Plateau Pressure:  [23 cmH20-27 cmH20] 26 cmH20   Intake/Output Summary (Last 24 hours) at 07/22/2021 0732 Last data filed at 07/22/2021 0600 Gross per 24 hour  Intake 3648 ml  Output 3100 ml  Net 548 ml   Filed Weights   07/21/21 0447 07/21/21 0915 07/22/21 0500  Weight: 120.8 kg 118.2 kg 117.2 kg    Examination: General: In bed, NAD, appears comfortable HEENT: MM pink/moist, anicteric, atraumatic Neuro: RASS -3, PERRL 322m MAE, sedated CV: S1S2, Afib, no m/r/g appreciated PULM:  air movement in all lobes, trachea midline, chest expansion symmetric GI: soft, bsx4 active, non-tender   Extremities: warm/dry, , capillary refill less than 3 seconds, L BKA with some drainage Skin: L bka with some drainage,  no rashes or lesions noted   Labs: Glucose 67-227 Hgb 9.5>9,5 Creat 4.66>5.15, BUN 27>35 CXR: stable lines and tubes, increasing basilar atelectasis  Resolved Hospital Problem list     Assessment & Plan:  Septic shock, secondary to infected left stump hematoma, status post I&D -Goal MAP 65. Switch from neo to levophed. Titrate to goal -Continue cefepime and vanc.  Follow up wound cultures/blood cultures. Initial wound culture with S. Areus -Continue tele  Acute respiratory failure with hypoxemia Left intubated post procedure due to unresponsiveness and volume overload -LTVV strategy with tidal volumes of 4-8 cc/kg ideal body weight -Goal plateau pressures less than 30 and driving pressures less than 15 -Wean PEEP/FiO2 for SpO2 92-98% -VAP bundle -Daily SAT and SBT.  Hoping to extubate on 6/22 once patient has had some  increased volume removal from CRRT. -PAD bundle with Precedex gtt and fentanyl gtt -RASS goal 0 to -1  ESRD on HD (MWF) -Now on CRRT.  Goal -50 to -100 per neph -Management per nephrology  Acute metabolic encephalopathy Suspect secondary to oversedation, sepsis -CRRT starting -Goal RASS 0 to -1.  Titrate sedation to goal -recheck ammonia in AM  Chronic atrial fibrillation on Eliquis Patient may have not been taking Eliquis post discharge from rehab.  CHA2DS2-VASc score of 5. -Status post I&D with debridement, discussed with Dr. Loanne Drilling.  Continue holding heparin drip at this time.  -Rate controlled, continue holding metoprolol. -Continue holding Eliquis per Dr. Carlis Abbott  4MM SDH on CT head 5/15 Eliquis resumed on 5/18 and plan to be continued on discharge, unclear if patient was taking Eliquis after discharge from rehab.  Head CT on 6/19 per radiology with no new intracranial abnormality compared to previous scans. -Continue daily wake-up assessments -Holding anticoagulation at this time  CAD History pulmonary hypertension -Will discuss resumption of Plavix today with VVS -Continue telemetry  Chronic hypotension Currently on vasopressors -Currently on Levophed and CRRT -Continue midodrine  DM2 Hypoglycemia, Glucose 67-227 -Blood Glucose goal 140-180. -SSI  Normocytic anemia, secondary to CKD No signs of bleeding -Transfuse PRBC if HBG less than 7 -Obtain AM CBC to trend H&H -Monitor for signs of bleeding  Best Practice (right click and "Reselect all SmartList Selections" daily)   Diet/type: tubefeeds DVT prophylaxis: prophylactic heparin  GI prophylaxis: PPI Lines: Central line, Dialysis Catheter, Arterial Line, and yes and it is still needed Foley:  N/A Code Status:  full code Last date of multidisciplinary goals of care discussion [NA] son updated over the phone on 6/20.  Will call to update again.  Critical care time: 39 minutes    Redmond School., MSN, APRN, AGACNP-BC [facility]  07/22/2021 , 7:32 AM  Please see [email or web address] for pager details  If no response, please call [phone] After hours, please call Elink at [phone]

## 2021-07-22 NOTE — Anesthesia Postprocedure Evaluation (Addendum)
Anesthesia Post Note  Patient: [name]  Procedure(s) Performed: REVISION AMPUTATION BELOW KNEE AND IRRIGATION AND DEBRIDEMENT, LEFT (Left: Knee)     Patient location during evaluation: SICU Anesthesia Type: General Level of consciousness: sedated Pain management: pain level controlled Vital Signs Assessment: post-procedure vital signs reviewed and stable Respiratory status: patient remains intubated per anesthesia plan Cardiovascular status: stable Postop Assessment: no apparent nausea or vomiting Anesthetic complications: no   No notable events documented.  Last Vitals:  Vitals:   07/22/21 1845 07/22/21 1900  BP:    Pulse: 97 90  Resp: 16 16  Temp:    SpO2: 100% 100%    Last Pain:  Vitals:   07/22/21 1257  TempSrc: Axillary                 [name]

## 2021-07-22 NOTE — Progress Notes (Signed)
ANTICOAGULATION CONSULT NOTE - Initial Consult  Pharmacy Consult:  Heparin Indication: atrial fibrillation  Allergies  Allergen Reactions   Bupropion Itching   Dilaudid [Hydromorphone] Other (See Comments)    Apnea, required intubation    Patient Measurements: Height: '5\' 7"'$  (170.2 cm) Weight: 117.2 kg (258 lb 6.1 oz) IBW/kg (Calculated) : 61.6 Heparin Dosing Weight: 89 kg  Vital Signs: Temp: 97.7 F (36.5 C) (06/21 1257) Temp Source: Axillary (06/21 1257) BP: 126/73 (06/21 0825) Pulse Rate: 98 (06/21 1300)  Labs: Recent Labs    07/18/2021 0438 07/24/2021 0956 07/21/21 0358 07/21/21 0414 07/21/21 1556 07/22/21 0354  HGB 9.4*   < > 8.8* 9.5*  --  9.5*  HCT 31.7*   < > 28.5* 28.0*  --  30.5*  PLT 222  --  243  --   --  246  CREATININE 6.78*  --  7.27*  --  4.66* 5.15*   < > = values in this interval not displayed.    Estimated Creatinine Clearance: 16.1 mL/min (A) (by C-G formula based on SCr of 5.15 mg/dL (H)).   Medical History: Past Medical History:  Diagnosis Date   Anaphylactic shock, unspecified, initial encounter 09/04/2018   Anemia    Anxiety    CAD (coronary artery disease)    Nonobstructive on CT 2019   CHF (congestive heart failure) (HCC)    Chronic bilateral pleural effusions 10/23/2018   COVID-19 virus infection 09/18/2018   ESRD (end stage renal disease) ([place])    Dialysis TTHSat- 3rd st   Gangrene ([place]) 09/18/2018   Headache(784.0)    High cholesterol    History of blood transfusion    Hypertension    Mitral regurgitation    moderate to severe MR 10/2018 echo   Neuropathy 01/29/2011   04/2011 MRI L-spine:  L5-S1: Bulge/shallow broad-based protrusion greatest centrally and in the right posterior lateral position. Minimal crowding of the  upper aspect of the S1 nerve root greater on the right. Left lateral disc osteophyte with mild encroachment upon but not  significant compression of the exiting left L5 nerve root.   05/2011: Dr. Sherwood Gambler ([facility]): DJD and mild disc bu   Pericardial effusion    PVD (peripheral vascular disease) ([place])    Right leg stent in [place].  (No records)   Restless legs    Rheumatoid arthritis ([place])    "knees" "hands", "RA"   Sleep apnea    does not use Cpap   Thoracic ascending aortic aneurysm (HCC)    4.4 cm 11/14/18 CTA   Type II diabetes mellitus ([place])    Uterine leiomyoma 01/10/2021    Assessment: 20 YOF presented with AMS and leg pain from recent L AKA.  Patient has a history of Afib on Eliquis PTA.  He was recently admitted with a SDH and received Andexxa for reversal.  On discharge from that admission, patient was cleared to resume Eliquis, though it is not clear whether patient was taking med.  Pharmacy consulted to dose IV heparin.  CBC stable; no bleeding reported.  Patient was previously therapeutic at 0.31 units/mL on heparin 2500 units/hr.  Goal of Therapy:  Heparin level 0.3-0.7 units/ml Monitor platelets by anticoagulation protocol: Yes   Plan:  D/C heparin SQ and heparin through CRRT Heparin infusion at 2500 units/hr Check 8 hr aPTT and heparin level Daily heparin level and CBC (order daily aPTT if needed)  Jashay Roddy D. Mina Marble, PharmD, BCPS, [place] 07/22/2021, 2:36 PM

## 2021-07-22 NOTE — Progress Notes (Signed)
[facility]  Subjective  -remains intubated in ICU.  On 2 mcg of Levophed.   Objective 109/67 82 99.3 F (37.4 C) (Oral) 16 96%  Intake/Output Summary (Last 24 hours) at 07/22/2021 0839 Last data filed at 07/22/2021 0810 Gross per 24 hour  Intake 3414.9 ml  Output 3100 ml  Net 314.9 ml    Left BKA dressing changed with viable muscle but fairly large cavity that is deep  Laboratory Lab Results: Recent Labs    07/21/21 0358 07/21/21 0414 07/22/21 0354  WBC 10.5  --  9.5  HGB 8.8* 9.5* 9.5*  HCT 28.5* 28.0* 30.5*  PLT 243  --  246   BMET Recent Labs    07/21/21 1556 07/22/21 0354  NA 136 135  K 4.0 4.1  CL 97* 97*  CO2 26 24  GLUCOSE 146* 227*  BUN 27* 35*  CREATININE 4.66* 5.15*  CALCIUM 8.5* 8.5*    COAG Lab Results  Component Value Date   INR 1.7 (H) 06/15/2021   INR 1.0 11/14/2018   INR 0.90 01/30/2011   No results found for: "PTT"  Assessment/Planning:  56 year old female that presented to the ER with altered mental status on Monday.  Ultimately found to have infected left below-knee amputation stump.  She underwent a washout of the BKA after I&D on Monday in the OR.  Betadine soaked Kerlix dressing changed today and the wound looks much better.  We will continue daily dressing changes.  We will have to see if this shows signs of healing to see if we can salvage this versus needing an above-knee amputation in the future.  OR cultures growing staph and gram-negative rods.  On vanc and cefepime.  Marty Heck 07/22/2021 8:39 AM --

## 2021-07-22 NOTE — Progress Notes (Signed)
[facility] Progress Note  Name: [name] MRN: [id number] DOB: 01/19/1966   Subjective:  ICU team placed nontunneled catheter for CRRT.  She had HD 6/20 am with 3.1 kg UF.  She was on phenylephrine with HD. I was not able to reach her husband via phone but spoke with her son this AM to update him.  She weighed 117.2 kg this am.   Review of systems:   Unable to obtain secondary to intubated  ------------------------ Background on consult:  HPI: [name] is a 56 y.o. female with ESRD on hemodialysis MWF at [facility]. 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 Recent admission 05/15-05/18/2023 for toxic encephalopathy with possible opioid overuse, possible 4 mm subdural hematoma seen on CT. She was discharged to SNF but is now coming from home. She was brought to ED this AM with C/O abdominal pain. Apparently was awake at home but since arrival to ED has been altered. SCr 6.78 BUN 44 CO2 28. WBC 8.1 HGB 9.4 Lactic acid 1.3. She has NOT missed HD, did have extra treatment 07/16/2021. She did not respond to narcan given in ED however she was awake and responsive when talking to VVS PA. She was seen by VVS for C/O pain in L stump. Mild dehiscence, copious purulent drainage noted.    Intake/Output Summary (Last 24 hours) at 07/22/2021 0624 Last data filed at 07/22/2021 0600 Gross per 24 hour  Intake 3648 ml  Output 3100 ml  Net 548 ml    Vitals:  Vitals:   07/22/21 0515 07/22/21 0530 07/22/21 0545 07/22/21 0600  BP:      Pulse: 86 89 92 90  Resp: '16 16 16 16  '$ Temp:      TempSrc:      SpO2: 99% 98% 97% 97%  Weight:      Height:         Physical Exam:   General: adult female in bed intubated  Head: NCAT Neck: increased neck circumference Lungs: coarse transmitted upper airway sounds FIO2 40 and PEEP 8 Heart: S1S2 no rub Abdomen: soft/distended with  obese habitus Lower extremities: left BKA wrapped; right leg is wrapped with gauze Neuro: sedation is currently running Dialysis Access: left IJ nontunneled dialysis catheter; RUE AVF bruit and thrill    Medications reviewed   Labs:     Latest Ref Rng & Units 07/22/2021    3:54 AM 07/21/2021    3:56 PM 07/21/2021    4:14 AM  BMP  Glucose 70 - 99 mg/dL 227  146    BUN 6 - 20 mg/dL 35  27    Creatinine 0.44 - 1.00 mg/dL 5.15  4.66    Sodium 135 - 145 mmol/L 135  136  139   Potassium 3.5 - 5.1 mmol/L 4.1  4.0  3.6   Chloride 98 - 111 mmol/L 97  97    CO2 22 - 32 mmol/L 24  26    Calcium 8.9 - 10.3 mg/dL 8.5  8.5     Dialysis Orders: [facility]: [facility] MWF 4 hrs 180NRE 450/800 113 kg 2.0L/2.5 Ca AVF -Hectorol 9 mcg IV TIW -Parsabiv 7.5 mg IV TIW -no ESA   Assessment/Plan:   # Wound dehiscence - left BKA  - s/p I&D of left BKA    # Acute Encephalopathy  - multifactorial with untreated sleep apnea as well as concern for  sedating meds.  Would avoid sedating medications.     # ESRD  - HD on 6/20 off schedule. Usually MWF outpatient.  Hypotensive and overloaded   - start CRRT today - 4K fluids and net negative 50 to 100 ml/hr as tolerated   - Aiming for a lower EDW  # Hypotension - on phenylephrine - pressors per primary team  - had been recently started on midodrine last admission and outpatient - see is continued here   # Anemia CKD  - hasn't been on ESA given concern for malignancy - renal and pulm lesions - Hb 9.5   # Metabolic bone disease  - phos acceptable.  On hectorol at reduced dose - corrected calcium up on admission.  Binders on hold and starting CRRT tomorrow so would continue to hold    # Atrial fibrillation  - per primary team    Disposition - continue inpatient monitoring.  Plan for CRRT on 6/21  Claudia Desanctis, MD 07/22/2021 6:43 AM

## 2021-07-23 DIAGNOSIS — G9341 Metabolic encephalopathy: Secondary | ICD-10-CM | POA: Diagnosis not present

## 2021-07-23 LAB — RENAL FUNCTION PANEL
Albumin: 2.1 g/dL — ABNORMAL LOW (ref 3.5–5.0)
Albumin: 2.2 g/dL — ABNORMAL LOW (ref 3.5–5.0)
Anion gap: 9 (ref 5–15)
Anion gap: 12 (ref 5–15)
BUN: 24 mg/dL — ABNORMAL HIGH (ref 6–20)
BUN: 21 mg/dL — ABNORMAL HIGH (ref 6–20)
CO2: 25 mmol/L (ref 22–32)
CO2: 25 mmol/L (ref 22–32)
Calcium: 8.3 mg/dL — ABNORMAL LOW (ref 8.9–10.3)
Calcium: 8.3 mg/dL — ABNORMAL LOW (ref 8.9–10.3)
Chloride: 102 mmol/L (ref 98–111)
Chloride: 100 mmol/L (ref 98–111)
Creatinine, Ser: 3.22 mg/dL — ABNORMAL HIGH (ref 0.44–1.00)
Creatinine, Ser: 2.44 mg/dL — ABNORMAL HIGH (ref 0.44–1.00)
GFR, Estimated: 16 mL/min — ABNORMAL LOW (ref >60)
GFR, Estimated: 23 mL/min — ABNORMAL LOW (ref >60)
Glucose, Bld: 195 mg/dL — ABNORMAL HIGH (ref 70–99)
Glucose, Bld: 268 mg/dL — ABNORMAL HIGH (ref 70–99)
Phosphorus: 2.5 mg/dL (ref 2.5–4.6)
Phosphorus: 3 mg/dL (ref 2.5–4.6)
Potassium: 3.8 mmol/L (ref 3.5–5.1)
Potassium: 4.1 mmol/L (ref 3.5–5.1)
Sodium: 136 mmol/L (ref 135–145)
Sodium: 137 mmol/L (ref 135–145)

## 2021-07-23 LAB — GLUCOSE, CAPILLARY
Glucose-Capillary: 187 mg/dL — ABNORMAL HIGH (ref 70–99)
Glucose-Capillary: 285 mg/dL — ABNORMAL HIGH (ref 70–99)
Glucose-Capillary: 293 mg/dL — ABNORMAL HIGH (ref 70–99)
Glucose-Capillary: 283 mg/dL — ABNORMAL HIGH (ref 70–99)
Glucose-Capillary: 240 mg/dL — ABNORMAL HIGH (ref 70–99)

## 2021-07-23 LAB — CBC
HCT: 31.2 % — ABNORMAL LOW (ref 36.0–46.0)
Hemoglobin: 9.6 g/dL — ABNORMAL LOW (ref 12.0–15.0)
MCH: 29.3 pg (ref 26.0–34.0)
MCHC: 30.8 g/dL (ref 30.0–36.0)
MCV: 95.1 fL (ref 80.0–100.0)
Platelets: 210 10*3/uL (ref 150–400)
RBC: 3.28 MIL/uL — ABNORMAL LOW (ref 3.87–5.11)
RDW: 17.1 % — ABNORMAL HIGH (ref 11.5–15.5)
WBC: 7.9 10*3/uL (ref 4.0–10.5)
nRBC: 0.5 % — ABNORMAL HIGH (ref 0.0–0.2)

## 2021-07-23 LAB — HEPARIN LEVEL (UNFRACTIONATED): Heparin Unfractionated: 0.46 IU/mL (ref 0.30–0.70)

## 2021-07-23 LAB — AMMONIA: Ammonia: 52 umol/L — ABNORMAL HIGH (ref 9–35)

## 2021-07-23 LAB — MAGNESIUM: Magnesium: 2.3 mg/dL (ref 1.7–2.4)

## 2021-07-23 MED ORDER — SODIUM PHOSPHATES 45 MMOLE/15ML IV SOLN
15.0000 mmol | Freq: Once | INTRAVENOUS | Status: AC
  Administered 2021-07-23: 15 mmol via INTRAVENOUS
  Filled 2021-07-23: qty 5

## 2021-07-23 MED ORDER — SODIUM CHLORIDE 0.9 % IV SOLN: INTRAVENOUS | Status: DC | PRN

## 2021-07-23 MED ORDER — CLOPIDOGREL BISULFATE 75 MG PO TABS
75.0000 mg | ORAL_TABLET | Freq: Every day | ORAL | Status: DC
  Administered 2021-07-23 – 2021-07-25 (×3): 75 mg
  Filled 2021-07-23 (×3): qty 1

## 2021-07-23 MED ORDER — FENTANYL CITRATE (PF) 100 MCG/2ML IJ SOLN
50.0000 ug | INTRAMUSCULAR | Status: DC | PRN
  Administered 2021-07-23 – 2021-07-24 (×5): 100 ug via INTRAVENOUS
  Filled 2021-07-23 (×5): qty 2

## 2021-07-23 MED ORDER — INSULIN DETEMIR 100 UNIT/ML ~~LOC~~ SOLN
10.0000 [IU] | Freq: Two times a day (BID) | SUBCUTANEOUS | Status: DC
  Administered 2021-07-23 – 2021-07-25 (×5): 10 [IU] via SUBCUTANEOUS
  Filled 2021-07-23 (×6): qty 0.1

## 2021-07-23 MED ORDER — MAGNESIUM SULFATE 2 GM/50ML IV SOLN
2.0000 g | Freq: Once | INTRAVENOUS | Status: AC
Start: 2021-07-23 — End: 2021-07-23
  Administered 2021-07-23: 2 g via INTRAVENOUS
  Filled 2021-07-23: qty 50

## 2021-07-23 MED ORDER — LACTULOSE 10 GM/15ML PO SOLN
20.0000 g | Freq: Every day | ORAL | Status: DC
  Administered 2021-07-23 – 2021-07-25 (×3): 20 g
  Filled 2021-07-23 (×3): qty 30

## 2021-07-23 MED ORDER — INSULIN ASPART 100 UNIT/ML IJ SOLN
0.0000 [IU] | INTRAMUSCULAR | Status: DC
  Administered 2021-07-23 (×2): 5 [IU] via SUBCUTANEOUS
  Administered 2021-07-23 – 2021-07-24 (×3): 3 [IU] via SUBCUTANEOUS
  Administered 2021-07-24: 5 [IU] via SUBCUTANEOUS
  Administered 2021-07-24 (×2): 2 [IU] via SUBCUTANEOUS
  Administered 2021-07-24: 5 [IU] via SUBCUTANEOUS
  Administered 2021-07-24: 2 [IU] via SUBCUTANEOUS
  Administered 2021-07-25: 3 [IU] via SUBCUTANEOUS
  Administered 2021-07-25: 5 [IU] via SUBCUTANEOUS

## 2021-07-23 MED ORDER — MIDAZOLAM HCL 2 MG/2ML IJ SOLN
2.0000 mg | INTRAMUSCULAR | Status: DC | PRN
  Administered 2021-07-23 – 2021-07-24 (×7): 2 mg via INTRAVENOUS
  Filled 2021-07-23 (×7): qty 2

## 2021-07-23 MED ORDER — PHENYLEPHRINE CONCENTRATED 100MG/250ML (0.4 MG/ML) INFUSION SIMPLE
0.0000 ug/min | INTRAVENOUS | Status: DC
  Administered 2021-07-23: 20 ug/min via INTRAVENOUS
  Administered 2021-07-24: 40 ug/min via INTRAVENOUS
  Filled 2021-07-23 (×3): qty 250

## 2021-07-23 MED ORDER — PROSOURCE TF PO LIQD
45.0000 mL | Freq: Every day | ORAL | Status: DC
Start: 2021-07-23 — End: 2021-07-25
  Administered 2021-07-23 – 2021-07-25 (×9): 45 mL
  Filled 2021-07-23 (×9): qty 45

## 2021-07-23 NOTE — Progress Notes (Signed)
[facility] Progress Note Patient Name: [name] DOB: 11-06-1965 MRN: [id number]   Date of Service  07/23/2021  HPI/Events of Note  Patient having short runs of wide complex tachycardia with spontaneous termination, electrolytes are within normal limits, she is on 5 mcg of Levophed.  eICU Interventions  Phenylephrine gtt will be started and Levo discontinued to see if it resolves the ectopy.        Kerry Kass Estefan Pattison 07/23/2021, 10:02 PM

## 2021-07-23 NOTE — Progress Notes (Signed)
Nutrition Follow-up  DOCUMENTATION CODES:   Not applicable  INTERVENTION:   Continue tube feeding via OG tube: Osmolite 1.5 at 60 ml/h (1440 ml per day) Increase Prosource TF to 45 ml 5 times/ day  Provides 2360 kcal, 145 gm protein, 1097 ml free water daily.  Renal MVI daily via tube.   NUTRITION DIAGNOSIS:   Increased nutrient needs related to acute illness (renal failure requiring CRRT), wound healing, chronic illness (ESRD on HD) as evidenced by estimated needs.  Ongoing  GOAL:   Patient will meet greater than or equal to 90% of their needs  Met with TF  MONITOR:   Vent status, TF tolerance, Skin, Labs, I & O's  REASON FOR ASSESSMENT:   Ventilator, Consult Enteral/tube feeding initiation and management  ASSESSMENT:   56 yo female admitted with hypotension, AMS, L BKA wound dehiscence. PMH includes ESRD on HD, DM-2, A fib, OSA, CAD, mitral regurgitation, HLD, HTN, CHF, anemia, PVD, L BKA (06/02/21), RA, neuropathy, anxiety.  6/19 - I&D of L BKA stump. Remains intubated since procedure.   6/21 - CRRT initiated.  Discussed patient in ICU rounds and with RN today. Patient is tolerating TF without difficulty. Protein needs have increased with initiation of CRRT.   Patient is currently intubated on ventilator support MV: 13 L/min Temp (24hrs), Avg:95.5 F (35.3 C), Min:93.4 F (34.1 C), Max:99 F (37.2 C)   Labs reviewed. K and phos WNL CBG: [phone]  Medications reviewed and include Hectorol, Novolog, Levemir, lactulose, Rena-vit, Precedex, Levophed, sodium phosphate.    Diet Order:   Diet Order             Diet NPO time specified  Diet effective now                   EDUCATION NEEDS:   Not appropriate for education at this time  Skin:  Skin Assessment: Skin Integrity Issues: Skin Integrity Issues:: Other (Comment) Other: pretibial venous stasis ulcer x 2; L leg BKA site S/P I&D  Last BM:  6/20 type 6  Height:   Ht Readings from  Last 1 Encounters:  07/02/2021 5' 7"  (1.702 m)    Weight:   Wt Readings from Last 1 Encounters:  07/23/21 115.8 kg    Ideal Body Weight:  57.4 kg (adjusted for BKA)  Estimated Nutritional Needs:   Kcal:  2200-2400  Protein:  >/= 140 gm  Fluid:  1 L + UOP    Lucas Mallow RD, LDN, CNSC Please refer to Amion for contact information.

## 2021-07-23 NOTE — Progress Notes (Signed)
ANTICOAGULATION CONSULT NOTE  Pharmacy Consult:  Heparin Indication: atrial fibrillation  Allergies  Allergen Reactions   Bupropion Itching   Dilaudid [Hydromorphone] Other (See Comments)    Apnea, required intubation    Patient Measurements: Height: '5\' 7"'$  (170.2 cm) Weight: 115.8 kg (255 lb 4.7 oz) IBW/kg (Calculated) : 61.6 Heparin Dosing Weight: 89 kg  Vital Signs: Temp: 97.9 F (36.6 C) (06/22 0800) Temp Source: Esophageal (06/22 0400) BP: 100/57 (06/22 0741) Pulse Rate: 123 (06/22 0800)  Labs: Recent Labs    07/21/21 0358 07/21/21 0414 07/21/21 1556 07/22/21 0354 07/22/21 1549 07/22/21 2254 07/23/21 0306 07/23/21 0814  HGB 8.8* 9.5*  --  9.5*  --   --  9.6*  --   HCT 28.5* 28.0*  --  30.5*  --   --  31.2*  --   PLT 243  --   --  246  --   --  210  --   APTT  --   --   --   --   --  99*  --   --   HEPARINUNFRC  --   --   --   --   --  0.30  --  0.46  CREATININE 7.27*  --    < > 5.15* 4.56*  --  3.22*  --    < > = values in this interval not displayed.     Estimated Creatinine Clearance: 25.7 mL/min (A) (by C-G formula based on SCr of 3.22 mg/dL (H)).  Assessment: 60 YOF presented with AMS and leg pain from recent L AKA.  Patient has a history of Afib on Eliquis PTA.  He was recently admitted with a SDH and received Andexxa for reversal.  On discharge from that admission, patient was cleared to resume Eliquis, though it is not clear whether patient was taking med.  Pharmacy consulted to dose IV heparin.    Heparin level therapeutic; CBC stable; no bleeding reported.  Goal of Therapy:  Heparin level 0.3-0.7 units/ml Monitor platelets by anticoagulation protocol: Yes   Plan:  Continue heparin infusion at 2500 units/hr Daily heparin level and CBC   Marius Betts D. Mina Marble, PharmD, BCPS, [place] 07/23/2021, 8:55 AM

## 2021-07-23 NOTE — Progress Notes (Signed)
ANTICOAGULATION CONSULT NOTE   Pharmacy Consult:  Heparin Indication: atrial fibrillation  Allergies  Allergen Reactions   Bupropion Itching   Dilaudid [Hydromorphone] Other (See Comments)    Apnea, required intubation    Patient Measurements: Height: '5\' 7"'$  (170.2 cm) Weight: 117.2 kg (258 lb 6.1 oz) IBW/kg (Calculated) : 61.6 Heparin Dosing Weight: 89 kg  Vital Signs: Temp: 94.6 F (34.8 C) (06/22 0000) Temp Source: Esophageal (06/22 0000) BP: 151/92 (06/21 1939) Pulse Rate: 121 (06/22 0000)  Labs: Recent Labs    07/16/2021 0438 07/02/2021 0956 07/21/21 0358 07/21/21 0414 07/21/21 1556 07/22/21 0354 07/22/21 1549 07/22/21 2254  HGB 9.4*   < > 8.8* 9.5*  --  9.5*  --   --   HCT 31.7*   < > 28.5* 28.0*  --  30.5*  --   --   PLT 222  --  243  --   --  246  --   --   APTT  --   --   --   --   --   --   --  99*  HEPARINUNFRC  --   --   --   --   --   --   --  0.30  CREATININE 6.78*  --  7.27*  --  4.66* 5.15* 4.56*  --    < > = values in this interval not displayed.     Estimated Creatinine Clearance: 18.2 mL/min (A) (by C-G formula based on SCr of 4.56 mg/dL (H)).   Medical History: Past Medical History:  Diagnosis Date   Anaphylactic shock, unspecified, initial encounter 09/04/2018   Anemia    Anxiety    CAD (coronary artery disease)    Nonobstructive on CT 2019   CHF (congestive heart failure) (HCC)    Chronic bilateral pleural effusions 10/23/2018   COVID-19 virus infection 09/18/2018   ESRD (end stage renal disease) ([place])    Dialysis TTHSat- 3rd st   Gangrene ([place]) 09/18/2018   Headache(784.0)    High cholesterol    History of blood transfusion    Hypertension    Mitral regurgitation    moderate to severe MR 10/2018 echo   Neuropathy 01/29/2011   04/2011 MRI L-spine:  L5-S1: Bulge/shallow broad-based protrusion greatest centrally and in the right posterior lateral position. Minimal crowding of the  upper aspect of the S1 nerve root greater on the  right. Left lateral disc osteophyte with mild encroachment upon but not  significant compression of the exiting left L5 nerve root.   05/2011: Dr. Sherwood Gambler ([facility]): DJD and mild disc bu   Pericardial effusion    PVD (peripheral vascular disease) ([place])    Right leg stent in [place].  (No records)   Restless legs    Rheumatoid arthritis ([place])    "knees" "hands", "RA"   Sleep apnea    does not use Cpap   Thoracic ascending aortic aneurysm (HCC)    4.4 cm 11/14/18 CTA   Type II diabetes mellitus ([place])    Uterine leiomyoma 01/10/2021    Assessment: 74 YOF presented with AMS and leg pain from recent L AKA.  Patient has a history of Afib on Eliquis PTA.  He was recently admitted with a SDH and received Andexxa (5/15) for reversal.  On discharge from that admission, patient was cleared to resume Eliquis, though it is not clear whether patient was taking med.  Pharmacy consulted to dose IV heparin.  CBC stable; no bleeding reported.  Initial heparin  level: 0.30 and aPTT 99 seconds on 2500 units/hr; will aim for lower end of therapeutic range given recent SDH; no infusion issues or overt bleeding reported  Goal of Therapy:  Heparin level 0.3-0.7 units/ml Monitor platelets by anticoagulation protocol: Yes   Plan:  D/C heparin SQ and heparin through CRRT Continue heparin infusion at 2500 units/hr Check 8 hr aPTT and heparin level Daily heparin level and CBC (order daily aPTT if needed)  Georga Bora, PharmD Clinical Pharmacist 07/23/2021 12:14 AM Please check AMION for all [place] numbers

## 2021-07-23 NOTE — Progress Notes (Signed)
Per report from off going day shift, pt had several runs of V-tach on day shift and MD aware. 2 g Mg given late this afternoon.  Tonight pt continues to have self resolving runs of V-tach that last anywhere from 5-10 beats.   QTc of 560.  ELINK called and made aware.

## 2021-07-23 NOTE — Progress Notes (Signed)
Inpatient Diabetes Program Recommendations  AACE/ADA: New Consensus Statement on Inpatient Glycemic Control (2015)  Target Ranges:  Prepandial:   less than 140 mg/dL      Peak postprandial:   less than 180 mg/dL (1-2 hours)      Critically ill patients:  140 - 180 mg/dL   Lab Results  Component Value Date   GLUCAP 285 (H) 07/23/2021   HGBA1C 11.3 (H) 04/26/2021    Review of Glycemic Control  Latest Reference Range & Units 07/22/21 15:57 07/22/21 20:25 07/22/21 22:57 07/23/21 03:11 07/23/21 08:11  Glucose-Capillary 70 - 99 mg/dL 312 (H) 197 (H) 200 (H) 187 (H) 285 (H)   Diabetes history: DM 2 Outpatient Diabetes medications:  Humalog 75/25- unsure of dose or if patient was taking this?  Current orders for Inpatient glycemic control:  Novolog 0-20 units q 4 hours Novolog 3 units q 4 hours Levemir 10 units bid  Inpatient Diabetes Program Recommendations:    Note renal failure.  Consider reducing Novolog correction to sensitive q 4 hours.   Thanks,  Adah Perl, RN, BC-ADM Inpatient Diabetes Coordinator Pager [phone]  (8a-5p)

## 2021-07-23 NOTE — Consult Note (Signed)
[place] [facility] Nurse Consult Note: Reason for Consult: venous insufficiency to RLE with ulceration, Clinical CEAP 6. Photos of these lesions have been provided to the EHR on 07/16/2021. 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  RLE will be washed daily with soap and water, rinse and dried. RLE pretibial area will be covered with a single layer of xeroform gauze (antimicrobial, nonadherent). The medial LE full thickness wound will be covered with silver hydrofiber (Aquacel Ag+ Advantage). Both dressings are to be topped with dry gauze and secured with Kerlix roll gauze applied from just below toes to just below knee in a spiral manner and secured with paper tape. The right foot is to be placed into a pressure redistribution heel boot (Prevalon).  A sacral prophylactic foam dressing is to the placed to the sacrum for PI prevention.  [facility] nursing team will not follow, but will remain available to this patient, the nursing and medical teams.  Please re-consult if needed.  Thank you for inviting [place] to participate in this patient's Plan of Care.  Maudie Flakes, MSN, RN, CNS, [place], Serita Grammes, [employer], [employer] phone:  [phone]

## 2021-07-24 ENCOUNTER — Inpatient Hospital Stay (HOSPITAL_COMMUNITY): Payer: Medicare Other

## 2021-07-24 LAB — RENAL FUNCTION PANEL
Albumin: 2.2 g/dL — ABNORMAL LOW (ref 3.5–5.0)
Albumin: 2.2 g/dL — ABNORMAL LOW (ref 3.5–5.0)
Anion gap: 8 (ref 5–15)
Anion gap: 10 (ref 5–15)
BUN: 20 mg/dL (ref 6–20)
BUN: 18 mg/dL (ref 6–20)
CO2: 24 mmol/L (ref 22–32)
CO2: 24 mmol/L (ref 22–32)
Calcium: 8.7 mg/dL — ABNORMAL LOW (ref 8.9–10.3)
Calcium: 8.3 mg/dL — ABNORMAL LOW (ref 8.9–10.3)
Chloride: 105 mmol/L (ref 98–111)
Chloride: 101 mmol/L (ref 98–111)
Creatinine, Ser: 2.11 mg/dL — ABNORMAL HIGH (ref 0.44–1.00)
Creatinine, Ser: 1.94 mg/dL — ABNORMAL HIGH (ref 0.44–1.00)
GFR, Estimated: 27 mL/min — ABNORMAL LOW (ref >60)
GFR, Estimated: 30 mL/min — ABNORMAL LOW (ref >60)
Glucose, Bld: 303 mg/dL — ABNORMAL HIGH (ref 70–99)
Glucose, Bld: 264 mg/dL — ABNORMAL HIGH (ref 70–99)
Phosphorus: 2.3 mg/dL — ABNORMAL LOW (ref 2.5–4.6)
Phosphorus: 3.3 mg/dL (ref 2.5–4.6)
Potassium: 4.5 mmol/L (ref 3.5–5.1)
Potassium: 4.7 mmol/L (ref 3.5–5.1)
Sodium: 137 mmol/L (ref 135–145)
Sodium: 135 mmol/L (ref 135–145)

## 2021-07-24 LAB — MAGNESIUM: Magnesium: 2.6 mg/dL — ABNORMAL HIGH (ref 1.7–2.4)

## 2021-07-24 LAB — CBC
HCT: 32 % — ABNORMAL LOW (ref 36.0–46.0)
Hemoglobin: 9.3 g/dL — ABNORMAL LOW (ref 12.0–15.0)
MCH: 28.7 pg (ref 26.0–34.0)
MCHC: 29.1 g/dL — ABNORMAL LOW (ref 30.0–36.0)
MCV: 98.8 fL (ref 80.0–100.0)
Platelets: 201 10*3/uL (ref 150–400)
RBC: 3.24 MIL/uL — ABNORMAL LOW (ref 3.87–5.11)
RDW: 17.3 % — ABNORMAL HIGH (ref 11.5–15.5)
WBC: 11.4 10*3/uL — ABNORMAL HIGH (ref 4.0–10.5)
nRBC: 1.3 % — ABNORMAL HIGH (ref 0.0–0.2)

## 2021-07-24 LAB — GLUCOSE, CAPILLARY
Glucose-Capillary: 174 mg/dL — ABNORMAL HIGH (ref 70–99)
Glucose-Capillary: 178 mg/dL — ABNORMAL HIGH (ref 70–99)
Glucose-Capillary: 184 mg/dL — ABNORMAL HIGH (ref 70–99)
Glucose-Capillary: 237 mg/dL — ABNORMAL HIGH (ref 70–99)
Glucose-Capillary: 250 mg/dL — ABNORMAL HIGH (ref 70–99)
Glucose-Capillary: 255 mg/dL — ABNORMAL HIGH (ref 70–99)
Glucose-Capillary: 257 mg/dL — ABNORMAL HIGH (ref 70–99)

## 2021-07-24 LAB — POCT I-STAT 7, (LYTES, BLD GAS, ICA,H+H)
Acid-Base Excess: 1 mmol/L (ref 0.0–2.0)
Bicarbonate: 28.1 mmol/L — ABNORMAL HIGH (ref 20.0–28.0)
Calcium, Ion: 1.2 mmol/L (ref 1.15–1.40)
HCT: 32 % — ABNORMAL LOW (ref 36.0–46.0)
Hemoglobin: 10.9 g/dL — ABNORMAL LOW (ref 12.0–15.0)
O2 Saturation: 97 %
Patient temperature: 98.1
Potassium: 4.5 mmol/L (ref 3.5–5.1)
Sodium: 138 mmol/L (ref 135–145)
TCO2: 30 mmol/L (ref 22–32)
pCO2 arterial: 54.6 mmHg — ABNORMAL HIGH (ref 32–48)
pH, Arterial: 7.317 — ABNORMAL LOW (ref 7.35–7.45)
pO2, Arterial: 95 mmHg (ref 83–108)

## 2021-07-24 LAB — OCCULT BLOOD X 1 CARD TO LAB, STOOL: Fecal Occult Bld: NEGATIVE

## 2021-07-24 LAB — HEPARIN LEVEL (UNFRACTIONATED)
Heparin Unfractionated: 0.6 IU/mL (ref 0.30–0.70)
Heparin Unfractionated: 0.59 IU/mL (ref 0.30–0.70)

## 2021-07-24 MED ORDER — ORAL CARE MOUTH RINSE: 15.0000 mL | OROMUCOSAL | Status: DC | PRN

## 2021-07-24 MED ORDER — ORAL CARE MOUTH RINSE
15.0000 mL | OROMUCOSAL | Status: DC
  Administered 2021-07-24 – 2021-07-25 (×2): 15 mL via OROMUCOSAL

## 2021-07-24 MED ORDER — IPRATROPIUM-ALBUTEROL 0.5-2.5 (3) MG/3ML IN SOLN
3.0000 mL | Freq: Four times a day (QID) | RESPIRATORY_TRACT | Status: AC
Start: 2021-07-24 — End: 2021-07-25
  Administered 2021-07-24 – 2021-07-25 (×4): 3 mL via RESPIRATORY_TRACT
  Filled 2021-07-24 (×4): qty 3

## 2021-07-24 MED ORDER — LIP MEDEX EX OINT
TOPICAL_OINTMENT | Freq: Every day | CUTANEOUS | Status: DC
  Administered 2021-07-24 – 2021-07-25 (×2): 75 via TOPICAL
  Filled 2021-07-24: qty 7

## 2021-07-24 MED ORDER — SODIUM PHOSPHATES 45 MMOLE/15ML IV SOLN
20.0000 mmol | Freq: Once | INTRAVENOUS | Status: AC
  Administered 2021-07-24: 20 mmol via INTRAVENOUS
  Filled 2021-07-24: qty 6.67

## 2021-07-24 MED ORDER — INSULIN ASPART 100 UNIT/ML IJ SOLN
6.0000 [IU] | INTRAMUSCULAR | Status: DC
  Administered 2021-07-24 – 2021-07-25 (×5): 6 [IU] via SUBCUTANEOUS

## 2021-07-24 MED ORDER — WHITE PETROLATUM EX OINT
TOPICAL_OINTMENT | CUTANEOUS | Status: DC | PRN
  Filled 2021-07-24: qty 28.35

## 2021-07-24 MED ORDER — MIDODRINE HCL 5 MG PO TABS
20.0000 mg | ORAL_TABLET | Freq: Three times a day (TID) | ORAL | Status: DC
  Administered 2021-07-24 – 2021-07-25 (×3): 20 mg
  Filled 2021-07-24 (×3): qty 4

## 2021-07-24 NOTE — Procedures (Signed)
Extubation Procedure Note  Patient Details:   Name: [name] DOB: 1965-12-11 MRN: [id number]   Airway Documentation:    Vent end date: 07/24/21 Vent end time: 0905   Evaluation  O2 sats: stable throughout Complications: No apparent complications Patient did tolerate procedure well. Bilateral Breath Sounds: Diminished   Yes  Patient extubated to 4L [place] without complications. Patient had positive cuff leak prior to extubation and able to follow commands. Patient able to speak with no stridor noted at this time.   [name] 07/24/2021, 9:37 AM

## 2021-07-24 NOTE — Procedures (Signed)
Cortrak  Person Inserting Tube:  Osa Craver, RD Tube Type:  Cortrak - 43 inches Tube Size:  10 Tube Location:  Left nare Secured by: Bridle Technique Used to Measure Tube Placement:  Marking at nare/corner of mouth Cortrak Secured At:  80 cm Procedure Comments:  Cortrak Tube Team Note:  Consult received to place a Cortrak feeding tube.   X-ray is required, abdominal x-ray has been ordered by the Cortrak team. Please confirm tube placement before using the Cortrak tube.   If the tube becomes dislodged please keep the tube and contact the Cortrak team at [email or web address] (password [email or web address]) for replacement.  If after hours and replacement cannot be delayed, place a NG tube and confirm placement with an abdominal x-ray.  Romelle Starcher MS, RDN, LDN, CNSC Registered Dietitian III Clinical Nutrition RD Pager and On-Call Pager Number Located in [place]

## 2021-07-25 LAB — CBC
HCT: 30.1 % — ABNORMAL LOW (ref 36.0–46.0)
Hemoglobin: 8.8 g/dL — ABNORMAL LOW (ref 12.0–15.0)
MCH: 29.2 pg (ref 26.0–34.0)
MCHC: 29.2 g/dL — ABNORMAL LOW (ref 30.0–36.0)
MCV: 100 fL (ref 80.0–100.0)
Platelets: 165 10*3/uL (ref 150–400)
RBC: 3.01 MIL/uL — ABNORMAL LOW (ref 3.87–5.11)
RDW: 17.4 % — ABNORMAL HIGH (ref 11.5–15.5)
WBC: 9.4 10*3/uL (ref 4.0–10.5)
nRBC: 0.5 % — ABNORMAL HIGH (ref 0.0–0.2)

## 2021-07-25 LAB — RENAL FUNCTION PANEL
Albumin: 2.2 g/dL — ABNORMAL LOW (ref 3.5–5.0)
Anion gap: 7 (ref 5–15)
BUN: 18 mg/dL (ref 6–20)
CO2: 24 mmol/L (ref 22–32)
Calcium: 8.5 mg/dL — ABNORMAL LOW (ref 8.9–10.3)
Chloride: 105 mmol/L (ref 98–111)
Creatinine, Ser: 1.62 mg/dL — ABNORMAL HIGH (ref 0.44–1.00)
GFR, Estimated: 37 mL/min — ABNORMAL LOW (ref >60)
Glucose, Bld: 236 mg/dL — ABNORMAL HIGH (ref 70–99)
Phosphorus: 1.9 mg/dL — ABNORMAL LOW (ref 2.5–4.6)
Potassium: 4.1 mmol/L (ref 3.5–5.1)
Sodium: 136 mmol/L (ref 135–145)

## 2021-07-25 LAB — AEROBIC/ANAEROBIC CULTURE W GRAM STAIN (SURGICAL/DEEP WOUND)

## 2021-07-25 LAB — POCT I-STAT 7, (LYTES, BLD GAS, ICA,H+H)
Acid-Base Excess: 1 mmol/L (ref 0.0–2.0)
Bicarbonate: 24.5 mmol/L (ref 20.0–28.0)
Calcium, Ion: 1.11 mmol/L — ABNORMAL LOW (ref 1.15–1.40)
HCT: 32 % — ABNORMAL LOW (ref 36.0–46.0)
Hemoglobin: 10.9 g/dL — ABNORMAL LOW (ref 12.0–15.0)
O2 Saturation: 95 %
Patient temperature: 96.4
Potassium: 5 mmol/L (ref 3.5–5.1)
Sodium: 135 mmol/L (ref 135–145)
TCO2: 25 mmol/L (ref 22–32)
pCO2 arterial: 30.8 mmHg — ABNORMAL LOW (ref 32–48)
pH, Arterial: 7.503 — ABNORMAL HIGH (ref 7.35–7.45)
pO2, Arterial: 63 mmHg — ABNORMAL LOW (ref 83–108)

## 2021-07-25 LAB — GLUCOSE, CAPILLARY
Glucose-Capillary: 244 mg/dL — ABNORMAL HIGH (ref 70–99)
Glucose-Capillary: 257 mg/dL — ABNORMAL HIGH (ref 70–99)
Glucose-Capillary: 280 mg/dL — ABNORMAL HIGH (ref 70–99)
Glucose-Capillary: 299 mg/dL — ABNORMAL HIGH (ref 70–99)

## 2021-07-25 LAB — CULTURE, BLOOD (ROUTINE X 2)
Culture: NO GROWTH
Special Requests: ADEQUATE

## 2021-07-25 LAB — MAGNESIUM: Magnesium: 2.6 mg/dL — ABNORMAL HIGH (ref 1.7–2.4)

## 2021-07-25 LAB — HEPARIN LEVEL (UNFRACTIONATED): Heparin Unfractionated: 0.55 IU/mL (ref 0.30–0.70)

## 2021-07-25 MED ORDER — EPINEPHRINE 1 MG/10ML IJ SOSY
PREFILLED_SYRINGE | INTRAMUSCULAR | Status: AC
  Administered 2021-07-25: 1 mg via INTRAVENOUS
  Filled 2021-07-25: qty 10

## 2021-07-25 MED ORDER — GLYCOPYRROLATE 0.2 MG/ML IJ SOLN: 0.2000 mg | INTRAMUSCULAR | Status: DC | PRN

## 2021-07-25 MED ORDER — POLYVINYL ALCOHOL 1.4 % OP SOLN: 1.0000 [drp] | Freq: Four times a day (QID) | OPHTHALMIC | Status: DC | PRN

## 2021-07-25 MED ORDER — CEFEPIME HCL 1 G IJ SOLR
1.0000 g | INTRAMUSCULAR | Status: DC
  Filled 2021-07-25: qty 10

## 2021-07-25 MED ORDER — MORPHINE SULFATE (PF) 2 MG/ML IV SOLN: 2.0000 mg | INTRAVENOUS | Status: DC | PRN

## 2021-07-25 MED ORDER — SODIUM PHOSPHATES 45 MMOLE/15ML IV SOLN
30.0000 mmol | Freq: Once | INTRAVENOUS | Status: AC
  Administered 2021-07-25: 30 mmol via INTRAVENOUS
  Filled 2021-07-25: qty 10

## 2021-07-25 MED ORDER — NOREPINEPHRINE 4 MG/250ML-% IV SOLN
INTRAVENOUS | Status: AC
  Filled 2021-07-25: qty 250

## 2021-07-25 MED ORDER — MORPHINE 100MG IN NS 100ML (1MG/ML) PREMIX INFUSION
0.0000 mg/h | INTRAVENOUS | Status: DC
  Administered 2021-07-25: 5 mg/h via INTRAVENOUS
  Filled 2021-07-25: qty 100

## 2021-07-25 MED ORDER — DEXTROSE 5 % IV SOLN
INTRAVENOUS | Status: DC
Start: 2021-07-25 — End: 2021-07-25

## 2021-07-25 MED ORDER — DIPHENHYDRAMINE HCL 50 MG/ML IJ SOLN: 25.0000 mg | INTRAMUSCULAR | Status: DC | PRN

## 2021-07-25 MED ORDER — EPINEPHRINE 1 MG/10ML IJ SOSY: 1.0000 mg | PREFILLED_SYRINGE | Freq: Once | INTRAMUSCULAR | Status: AC

## 2021-07-25 MED ORDER — VASOPRESSIN 20 UNITS/100 ML INFUSION FOR SHOCK
0.0000 [IU]/min | INTRAVENOUS | Status: DC
  Administered 2021-07-25: 0.03 [IU]/min via INTRAVENOUS

## 2021-07-25 MED ORDER — VANCOMYCIN VARIABLE DOSE PER UNSTABLE RENAL FUNCTION (PHARMACIST DOSING): Status: DC

## 2021-07-25 MED ORDER — SODIUM BICARBONATE 8.4 % IV SOLN
50.0000 meq | Freq: Once | INTRAVENOUS | Status: AC
  Administered 2021-07-25: 50 meq via INTRAVENOUS
  Filled 2021-07-25: qty 50

## 2021-07-25 MED ORDER — GUAIFENESIN-DM 100-10 MG/5ML PO SYRP
5.0000 mL | ORAL_SOLUTION | ORAL | Status: DC | PRN
Start: 2021-07-25 — End: 2021-07-25
  Administered 2021-07-25: 5 mL via ORAL
  Filled 2021-07-25 (×2): qty 5

## 2021-07-25 MED ORDER — MORPHINE SULFATE (PF) 2 MG/ML IV SOLN
2.0000 mg | Freq: Once | INTRAVENOUS | Status: AC
  Administered 2021-07-25: 2 mg via INTRAVENOUS
  Filled 2021-07-25: qty 1

## 2021-07-25 MED ORDER — HYDROCORTISONE SOD SUC (PF) 100 MG IJ SOLR
100.0000 mg | Freq: Two times a day (BID) | INTRAMUSCULAR | Status: DC
  Administered 2021-07-25: 100 mg via INTRAVENOUS
  Filled 2021-07-25 (×2): qty 2

## 2021-07-25 MED ORDER — NOREPINEPHRINE 16 MG/250ML-% IV SOLN
0.0000 ug/min | INTRAVENOUS | Status: DC
  Administered 2021-07-25: 10 ug/min via INTRAVENOUS
  Filled 2021-07-25: qty 250

## 2021-07-25 MED ORDER — GLYCOPYRROLATE 1 MG PO TABS: 1.0000 mg | ORAL_TABLET | ORAL | Status: DC | PRN

## 2021-07-25 MED ORDER — CHLORHEXIDINE GLUCONATE CLOTH 2 % EX PADS: 6.0000 | MEDICATED_PAD | CUTANEOUS | Status: DC

## 2021-07-25 MED ORDER — MORPHINE BOLUS VIA INFUSION: 5.0000 mg | INTRAVENOUS | Status: DC | PRN

## 2021-07-29 ENCOUNTER — Ambulatory Visit: Payer: Medicare Other

## 2021-07-29 LAB — GLUCOSE, CAPILLARY: Glucose-Capillary: 31 mg/dL — CL (ref 70–99)

## 2021-08-01 NOTE — Progress Notes (Signed)
Patient placed on BIPAP at this time for increased WOB and O2 desaturations.

## 2021-08-01 DEATH — deceased
